# Patient Record
Sex: Female | Born: 1965 | Hispanic: No | Marital: Single | Smoking: Never smoker
Health system: Southern US, Community
[De-identification: ages and names within clinical notes are randomized; demographics above are authoritative.]

## PROBLEM LIST (undated history)

## (undated) ENCOUNTER — Emergency Department (HOSPITAL_COMMUNITY): Source: Home / Self Care

## (undated) ENCOUNTER — Emergency Department (HOSPITAL_COMMUNITY)

## (undated) DIAGNOSIS — N390 Urinary tract infection, site not specified: Secondary | ICD-10-CM

## (undated) DIAGNOSIS — I10 Essential (primary) hypertension: Secondary | ICD-10-CM

## (undated) DIAGNOSIS — N289 Disorder of kidney and ureter, unspecified: Secondary | ICD-10-CM

## (undated) DIAGNOSIS — Z9359 Other cystostomy status: Secondary | ICD-10-CM

## (undated) DIAGNOSIS — G40909 Epilepsy, unspecified, not intractable, without status epilepticus: Secondary | ICD-10-CM

## (undated) DIAGNOSIS — N186 End stage renal disease: Secondary | ICD-10-CM

## (undated) DIAGNOSIS — G8220 Paraplegia, unspecified: Secondary | ICD-10-CM

## (undated) DIAGNOSIS — C801 Malignant (primary) neoplasm, unspecified: Secondary | ICD-10-CM

## (undated) DIAGNOSIS — Z992 Dependence on renal dialysis: Secondary | ICD-10-CM

## (undated) DIAGNOSIS — R569 Unspecified convulsions: Secondary | ICD-10-CM

## (undated) DIAGNOSIS — N939 Abnormal uterine and vaginal bleeding, unspecified: Principal | ICD-10-CM

## (undated) HISTORY — DX: Essential (primary) hypertension: I10

## (undated) HISTORY — DX: Unspecified convulsions: R56.9

## (undated) HISTORY — DX: Abnormal uterine and vaginal bleeding, unspecified: N93.9

## (undated) HISTORY — PX: BACK SURGERY: SHX140

## (undated) HISTORY — DX: Other cystostomy status: Z93.59

## (undated) HISTORY — DX: Malignant (primary) neoplasm, unspecified: C80.1

---

## 1997-10-20 ENCOUNTER — Other Ambulatory Visit: Admission: RE | Admit: 1997-10-20 | Discharge: 1997-10-20 | Payer: Self-pay | Admitting: Obstetrics

## 1997-10-20 ENCOUNTER — Encounter: Admission: RE | Admit: 1997-10-20 | Discharge: 1997-10-20 | Payer: Self-pay | Admitting: Obstetrics

## 1998-01-11 ENCOUNTER — Inpatient Hospital Stay (HOSPITAL_COMMUNITY): Admission: AD | Admit: 1998-01-11 | Discharge: 1998-01-11 | Payer: Self-pay | Admitting: Obstetrics

## 1998-08-18 ENCOUNTER — Other Ambulatory Visit: Admission: RE | Admit: 1998-08-18 | Discharge: 1998-09-09 | Payer: Self-pay

## 1998-08-18 ENCOUNTER — Encounter: Admission: RE | Admit: 1998-08-18 | Discharge: 1998-08-18 | Payer: Self-pay | Admitting: Obstetrics & Gynecology

## 1998-09-21 ENCOUNTER — Encounter: Admission: RE | Admit: 1998-09-21 | Discharge: 1998-09-21 | Payer: Self-pay | Admitting: Obstetrics

## 1998-11-21 ENCOUNTER — Encounter: Admission: RE | Admit: 1998-11-21 | Discharge: 1998-11-21 | Payer: Self-pay | Admitting: Obstetrics & Gynecology

## 1999-02-09 ENCOUNTER — Encounter: Admission: RE | Admit: 1999-02-09 | Discharge: 1999-02-09 | Payer: Self-pay | Admitting: Obstetrics & Gynecology

## 1999-03-23 ENCOUNTER — Encounter: Admission: RE | Admit: 1999-03-23 | Discharge: 1999-03-23 | Payer: Self-pay | Admitting: Obstetrics & Gynecology

## 1999-03-23 ENCOUNTER — Other Ambulatory Visit: Admission: RE | Admit: 1999-03-23 | Discharge: 1999-03-23 | Payer: Self-pay | Admitting: *Deleted

## 2011-03-24 ENCOUNTER — Encounter: Payer: Self-pay | Admitting: *Deleted

## 2011-03-24 ENCOUNTER — Emergency Department (HOSPITAL_COMMUNITY)
Admission: EM | Admit: 2011-03-24 | Discharge: 2011-03-24 | Disposition: A | Discharge status: Home / Self Care | Payer: Medicaid Other | Attending: Emergency Medicine | Admitting: Emergency Medicine

## 2011-03-24 DIAGNOSIS — T8389XA Other specified complication of genitourinary prosthetic devices, implants and grafts, initial encounter: Secondary | ICD-10-CM | POA: Insufficient documentation

## 2011-03-24 DIAGNOSIS — Y849 Medical procedure, unspecified as the cause of abnormal reaction of the patient, or of later complication, without mention of misadventure at the time of the procedure: Secondary | ICD-10-CM | POA: Insufficient documentation

## 2011-03-24 DIAGNOSIS — T83091A Other mechanical complication of indwelling urethral catheter, initial encounter: Secondary | ICD-10-CM

## 2011-03-24 DIAGNOSIS — G8220 Paraplegia, unspecified: Secondary | ICD-10-CM | POA: Insufficient documentation

## 2011-03-24 HISTORY — DX: Paraplegia, unspecified: G82.20

## 2011-03-24 NOTE — ED Notes (Signed)
Pt reporting that she has not had any drainage from foley cath since having it changed today by home health.  At this time, leg bag does contain amber colored urine.  150 cc's emptied, pt reports this is the only output since this morning.  Informed EDP that some urine has been drained.  Request received to irrigate foley cath.

## 2011-03-24 NOTE — ED Notes (Signed)
Pt arrived via EMS.  Reports that she had her foley changed by home health RN today, and since that time has not has any urine output, despite adequate intake.

## 2011-03-24 NOTE — ED Notes (Signed)
Additional 150 cc's of urine emptied from bag. Catheter irrigated with 100 ccs sterile water. Drainage bag changed to bedside bag, rather than leg bag, for ease in draining.

## 2011-03-24 NOTE — ED Provider Notes (Signed)
History     CSN: [id number] Arrival date & time: 03/24/2011 12:11 AM   First MD Initiated Contact with Patient 03/24/11 0133      Chief Complaint  Patient presents with  . Urinary Retention    (Consider location/radiation/quality/duration/timing/severity/associated sxs/prior treatment) HPI Is a 45 year old Hispanic female with history of paraplegia. She has an indwelling Foley which she states stopped draining yesterday. She was having moderate discomfort due to an unrelated bladder. She denies fevers or chills. She denies nausea or vomiting. Her Foley was irrigated by nursing staff prior to my examination with relief of the obstruction. She denies discomfort at this time. Her Foley is now draining.  Past Medical History  Diagnosis Date  . Paraplegia (lower)     History reviewed. No pertinent past surgical history.  No family history on file.  History  Substance Use Topics  . Smoking status: Never Smoker   . Smokeless tobacco: Not on file  . Alcohol Use: No    OB History    Grav Para Term Preterm Abortions TAB SAB Ect Mult Living                  Review of Systems  All other systems reviewed and are negative.    Allergies  Review of patient's allergies indicates no known allergies.  Home Medications   Current Outpatient Rx  Name Route Sig Dispense Refill  . BACLOFEN 20 MG PO TABS Oral Take 20 mg by mouth 4 (four) times daily.      Marland Kitchen DANTROLENE SODIUM 25 MG PO CAPS Oral Take 50 mg by mouth 3 (three) times daily.      Marland Kitchen DIAZEPAM 5 MG PO TABS Oral Take 5 mg by mouth every 8 (eight) hours as needed.      Marland Kitchen DOCUSATE SODIUM 100 MG PO CAPS Oral Take 100 mg by mouth 2 (two) times daily.      Marland Kitchen GABAPENTIN 300 MG PO CAPS Oral Take 300 mg by mouth 3 (three) times daily.      Marland Kitchen NAPROXEN 500 MG PO TABS Oral Take 500 mg by mouth 2 (two) times daily with a meal.      . NITROFURANTOIN MACROCRYSTAL 50 MG PO CAPS Oral Take 50 mg by mouth at bedtime.        BP 104/77  Pulse  82  Temp(Src) 98.3 F (36.8 C) (Oral)  Resp 16  Ht 5' (1.524 m)  SpO2 98%  LMP 03/22/2011  Physical Exam General: Well-developed, well-nourished female in no acute distress; appearance consistent with age of record HENT: normocephalic, atraumatic Eyes: pupils equal round and reactive to light Neck: supple Heart: regular rate and rhythm Lungs: clear to auscultation bilaterally Abdomen: soft; nontender GU: Foley draining clear yellow urine Extremities: Feet in supportive orthotic devices bilaterally; atrophy of lower extremities Neurologic: Awake, alert; paraplegic Skin: Warm and dry     ED Course  Procedures (including critical care time)     MDM          Wynetta Fines, MD 03/24/11 [phone]

## 2011-04-16 ENCOUNTER — Other Ambulatory Visit (HOSPITAL_COMMUNITY): Payer: Self-pay | Admitting: Internal Medicine

## 2011-04-16 DIAGNOSIS — Z139 Encounter for screening, unspecified: Secondary | ICD-10-CM

## 2011-04-22 ENCOUNTER — Ambulatory Visit (HOSPITAL_COMMUNITY): Payer: Medicaid Other

## 2011-04-23 ENCOUNTER — Ambulatory Visit (HOSPITAL_COMMUNITY): Payer: Self-pay

## 2011-04-26 ENCOUNTER — Ambulatory Visit (HOSPITAL_COMMUNITY)
Admission: RE | Admit: 2011-04-26 | Discharge: 2011-04-26 | Disposition: A | Discharge status: Home / Self Care | Payer: Medicaid Other | Source: Ambulatory Visit | Attending: Internal Medicine | Admitting: Internal Medicine

## 2011-04-26 DIAGNOSIS — Z139 Encounter for screening, unspecified: Secondary | ICD-10-CM

## 2011-04-26 DIAGNOSIS — Z1231 Encounter for screening mammogram for malignant neoplasm of breast: Secondary | ICD-10-CM | POA: Insufficient documentation

## 2011-06-10 ENCOUNTER — Encounter (HOSPITAL_COMMUNITY): Payer: Self-pay

## 2011-06-10 ENCOUNTER — Emergency Department (HOSPITAL_COMMUNITY)
Admission: EM | Admit: 2011-06-10 | Discharge: 2011-06-10 | Disposition: A | Discharge status: Home / Self Care | Payer: Medicaid Other | Attending: Emergency Medicine | Admitting: Emergency Medicine

## 2011-06-10 DIAGNOSIS — Z79899 Other long term (current) drug therapy: Secondary | ICD-10-CM | POA: Insufficient documentation

## 2011-06-10 DIAGNOSIS — G8220 Paraplegia, unspecified: Secondary | ICD-10-CM | POA: Insufficient documentation

## 2011-06-10 DIAGNOSIS — R339 Retention of urine, unspecified: Secondary | ICD-10-CM | POA: Insufficient documentation

## 2011-06-10 DIAGNOSIS — N390 Urinary tract infection, site not specified: Secondary | ICD-10-CM

## 2011-06-10 LAB — URINALYSIS, ROUTINE W REFLEX MICROSCOPIC: Urobilinogen, UA: 1 mg/dL (ref 0.0–1.0)

## 2011-06-10 LAB — URINE MICROSCOPIC-ADD ON

## 2011-06-10 MED ORDER — NITROFURANTOIN MACROCRYSTAL 50 MG PO CAPS: 100.0000 mg | ORAL_CAPSULE | Freq: Every day | ORAL | Status: DC

## 2011-06-10 MED ORDER — NITROFURANTOIN MACROCRYSTAL 100 MG PO CAPS
ORAL_CAPSULE | ORAL | Status: AC
  Administered 2011-06-10: 100 mg
  Filled 2011-06-10: qty 1

## 2011-06-10 MED ORDER — NITROFURANTOIN MONOHYD MACRO 100 MG PO CAPS
100.0000 mg | ORAL_CAPSULE | Freq: Once | ORAL | Status: DC
  Filled 2011-06-10: qty 1
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
## 2011-06-10 NOTE — ED Notes (Signed)
Foley cath from home removed, w/o difficulty, new cath inserted.

## 2011-06-10 NOTE — ED Notes (Signed)
Foley cath not draining properly, pt tried as taught but will not drain

## 2011-08-04 ENCOUNTER — Emergency Department (HOSPITAL_COMMUNITY)
Admission: EM | Admit: 2011-08-04 | Discharge: 2011-08-04 | Disposition: A | Discharge status: Home / Self Care | Payer: Medicaid Other | Attending: Emergency Medicine | Admitting: Emergency Medicine

## 2011-08-04 ENCOUNTER — Encounter (HOSPITAL_COMMUNITY): Payer: Self-pay

## 2011-08-04 DIAGNOSIS — T839XXA Unspecified complication of genitourinary prosthetic device, implant and graft, initial encounter: Secondary | ICD-10-CM

## 2011-08-04 DIAGNOSIS — T83091A Other mechanical complication of indwelling urethral catheter, initial encounter: Secondary | ICD-10-CM | POA: Insufficient documentation

## 2011-08-04 DIAGNOSIS — R339 Retention of urine, unspecified: Secondary | ICD-10-CM | POA: Insufficient documentation

## 2011-08-04 DIAGNOSIS — G8220 Paraplegia, unspecified: Secondary | ICD-10-CM | POA: Insufficient documentation

## 2011-08-04 DIAGNOSIS — Z79899 Other long term (current) drug therapy: Secondary | ICD-10-CM | POA: Insufficient documentation

## 2011-08-04 DIAGNOSIS — Y846 Urinary catheterization as the cause of abnormal reaction of the patient, or of later complication, without mention of misadventure at the time of the procedure: Secondary | ICD-10-CM | POA: Insufficient documentation

## 2011-08-04 NOTE — ED Notes (Signed)
Awaiting MD evaluation.

## 2011-08-04 NOTE — ED Notes (Signed)
No change in pt condition or assessment. Awaiting D/C paperwork from MD. Catheter is draining clear yellow urine.

## 2011-08-04 NOTE — Discharge Instructions (Signed)
Continue your regular catheter care. Follow up with your doctor.

## 2011-08-04 NOTE — ED Notes (Signed)
Pt a/ox4. resp even and unlabored. NAD at this time. D/C instructions reviewed with pt. Pt verbalized understanding. Pt out of facility via EMS stretcher.

## 2011-08-04 NOTE — ED Notes (Signed)
77 F Foley Cath inserted under sterile technique. Clear yellow urine returned. Leg bag placed per pt request.

## 2011-08-04 NOTE — ED Notes (Signed)
Awaiting transport.

## 2011-08-04 NOTE — ED Notes (Signed)
Pt brought in by EMS for leaking urinary cath. Pt states she feels like it is leaking from the inside. Pt states her cath might have been changed last month.

## 2011-08-04 NOTE — ED Provider Notes (Signed)
History     CSN: [id number]  Arrival date & time 08/04/11  1049   First MD Initiated Contact with Patient 08/04/11 1304      Chief Complaint  Patient presents with  . Urinary Retention    (Consider location/radiation/quality/duration/timing/severity/associated sxs/prior treatment) HPI  @[name]  IS A 46 y.o. female brought in by parents to the Emergency Department complaining of  Leaking around her urinary catheter which began last night. Patient has a long term indwelling catheter due to paraplegia.Denies fever, chills, nausea, vomiting, chest pain, shortness of breath.  PCP Dr. Legrand Rams Past Medical History  Diagnosis Date  . Paraplegia (lower)     History reviewed. No pertinent past surgical history.  No family history on file.  History  Substance Use Topics  . Smoking status: Never Smoker   . Smokeless tobacco: Not on file  . Alcohol Use: No    OB History    Grav Para Term Preterm Abortions TAB SAB Ect Mult Living                  Review of Systems A 10 review of systems reviewed and are negative for acute change except as noted in the HPI. Allergies  Review of patient's allergies indicates no known allergies.  Home Medications   Current Outpatient Rx  Name Route Sig Dispense Refill  . BACLOFEN 20 MG PO TABS Oral Take 20 mg by mouth 4 (four) times daily.      Marland Kitchen DANTROLENE SODIUM 25 MG PO CAPS Oral Take 50 mg by mouth 3 (three) times daily.      Marland Kitchen DIAZEPAM 5 MG PO TABS Oral Take 5 mg by mouth every 8 (eight) hours as needed. For anxiety    . DOCUSATE SODIUM 100 MG PO CAPS Oral Take 100 mg by mouth 2 (two) times daily.      Marland Kitchen GABAPENTIN 300 MG PO CAPS Oral Take 300 mg by mouth 3 (three) times daily.      Marland Kitchen NAPROXEN 500 MG PO TABS Oral Take 500 mg by mouth 2 (two) times daily with a meal.      . NITROFURANTOIN MACROCRYSTAL 50 MG PO CAPS Oral Take 2 capsules (100 mg total) by mouth at bedtime. 10 capsule 0    BP 121/82  Pulse 73  Temp(Src) 98.4 F (36.9 C)  (Oral)  Resp 18  Ht 5\' 3"  (1.6 m)  Wt 145 lb (65.772 kg)  BMI 25.69 kg/m2  SpO2 99%  LMP 07/25/2011  Physical Exam Physical examination:  Nursing notes reviewed; Vital signs and O2 SAT reviewed;  Constitutional: Well developed, Well nourished, Well hydrated, In no acute distress; Head:  Normocephalic, atraumatic; Eyes: EOMI, PERRL, No scleral icterus; ENMT: Mouth and pharynx normal, Mucous membranes moist; Neck: Supple, Full range of motion, No lymphadenopathy; Cardiovascular: Regular rate and rhythm, No murmur, rub, or gallop; Respiratory: Breath sounds clear & equal bilaterally, No rales, rhonchi, wheezes, or rub, Normal respiratory effort/excursion; Chest: Nontender, Movement normal; Abdomen: Soft, Nontender, Nondistended, Normal bowel sounds; Genitourinary: No CVA tenderness; Extremities: Pulses normal, No tenderness, No edema, No calf edema or asymmetry.; Neuro: AA&Ox3, Major CN grossly intact.  Lower extremity paraplegia. Both lower legs re in CAM walkers Skin: Color normal, Warm, Dry  ED Course  Procedures (including critical care time)    MDM  Paraplegic patient with leaking around the foley catheter. Catheter replaced.Pt stable in ED with no significant deterioration in condition.The patient appears reasonably screened and/or stabilized for discharge and I doubt any other  medical condition or other [facility] requiring further screening, evaluation, or treatment in the ED at this time prior to discharge.  MDM Reviewed: nursing note and vitals           Gypsy Balsam. Olin Hauser, MD 08/04/11 1413

## 2011-08-14 ENCOUNTER — Encounter (HOSPITAL_COMMUNITY): Payer: Self-pay

## 2011-08-14 ENCOUNTER — Emergency Department (HOSPITAL_COMMUNITY)
Admission: EM | Admit: 2011-08-14 | Discharge: 2011-08-14 | Disposition: A | Discharge status: Home / Self Care | Payer: Medicaid Other | Attending: Emergency Medicine | Admitting: Emergency Medicine

## 2011-08-14 DIAGNOSIS — G8220 Paraplegia, unspecified: Secondary | ICD-10-CM | POA: Insufficient documentation

## 2011-08-14 DIAGNOSIS — R339 Retention of urine, unspecified: Secondary | ICD-10-CM | POA: Insufficient documentation

## 2011-08-14 DIAGNOSIS — T8389XA Other specified complication of genitourinary prosthetic devices, implants and grafts, initial encounter: Secondary | ICD-10-CM | POA: Insufficient documentation

## 2011-08-14 DIAGNOSIS — T83091A Other mechanical complication of indwelling urethral catheter, initial encounter: Secondary | ICD-10-CM

## 2011-08-14 NOTE — ED Notes (Signed)
EMS called for transport home.

## 2011-08-14 NOTE — ED Provider Notes (Signed)
History   This chart was scribed for [employer]. Olin Hauser, MD by Reece Agar. The patient was seen in room APA09/APA09. Patient's care was started at 0717.   CSN: [id number]  Arrival date & time 08/14/11  [id number]   First MD Initiated Contact with Patient 08/14/11 [phone]      Chief Complaint  Patient presents with  . Urinary Retention    (Consider location/radiation/quality/duration/timing/severity/associated sxs/prior treatment) HPI [name] is a 46 y.o. female who presents to the Emergency Department stating that her indwelling foley catheter ceased draining urine onset yesterday and persistent since. States lack of draining from catheter was not relieved with irrigation of catheter performed yesterday by herself. Patient notes her catheter was last replaced 1 month ago. Also notes she has had difficulties with the foley catheter leaking intermittently over the past several weeks. Denies fever, chills, nausea, vomiting, diarrhea. Patient with a h/o paraplegia.  Past Medical History  Diagnosis Date  . Paraplegia (lower)     History reviewed. No pertinent past surgical history.  No family history on file.  History  Substance Use Topics  . Smoking status: Never Smoker   . Smokeless tobacco: Not on file  . Alcohol Use: No    OB History    Grav Para Term Preterm Abortions TAB SAB Ect Mult Living                  Review of Systems 10 Systems reviewed and are negative for acute change except as noted in the HPI.  Allergies  Review of patient's allergies indicates no known allergies.  Home Medications   Current Outpatient Rx  Name Route Sig Dispense Refill  . BACLOFEN 20 MG PO TABS Oral Take 20 mg by mouth 4 (four) times daily.      Marland Kitchen DANTROLENE SODIUM 25 MG PO CAPS Oral Take 50 mg by mouth 3 (three) times daily.      Marland Kitchen DIAZEPAM 5 MG PO TABS Oral Take 5 mg by mouth every 8 (eight) hours as needed. For anxiety    . DOCUSATE SODIUM 100 MG PO CAPS Oral Take 100 mg by  mouth 2 (two) times daily.      Marland Kitchen GABAPENTIN 300 MG PO CAPS Oral Take 300 mg by mouth 3 (three) times daily.      Marland Kitchen NAPROXEN 500 MG PO TABS Oral Take 500 mg by mouth 2 (two) times daily with a meal.      . NITROFURANTOIN MACROCRYSTAL 50 MG PO CAPS Oral Take 2 capsules (100 mg total) by mouth at bedtime. 10 capsule 0    BP 127/81  Pulse 72  Temp(Src) 97.9 F (36.6 C) (Oral)  Resp 18  Ht 5\' 2"  (1.575 m)  Wt 120 lb (54.432 kg)  BMI 21.95 kg/m2  SpO2 98%  LMP 08/13/2011  Physical Exam  Nursing note and vitals reviewed. Constitutional: She is oriented to person, place, and time. She appears well-developed and well-nourished. No distress.  HENT:  Head: Normocephalic and atraumatic.  Eyes: EOM are normal. Pupils are equal, round, and reactive to light.  Neck: Neck supple. No tracheal deviation present.  Cardiovascular: Normal rate, regular rhythm and normal heart sounds.   No murmur heard. Pulmonary/Chest: Effort normal. No respiratory distress. She has no wheezes.  Abdominal: Soft. She exhibits no distension. There is no tenderness.  Genitourinary:       Indwelling foley catheter present.   Musculoskeletal: Normal range of motion. She exhibits no edema.  Braces to bilateral feet/lower legs.   Neurological: She is alert and oriented to person, place, and time. No sensory deficit.  Skin: Skin is warm and dry.  Psychiatric: She has a normal mood and affect. Her behavior is normal.    ED Course  Procedures (including critical care time) DIAGNOSTIC STUDIES: Oxygen Saturation is 98% on room air, normal by my interpretation.    COORDINATION OF CARE: 7:50AM-Patient informed of current plan for treatment and evaluation and agrees with plan at this time.     Labs Reviewed - No data to display No results found.   No diagnosis found.    MDM  Paraplegic patient with continuous indwelling Foley catheter having drainage problems. Catheter replaced.Pt stable in ED with no  significant deterioration in condition.The patient appears reasonably screened and/or stabilized for discharge and I doubt any other medical condition or other [facility] requiring further screening, evaluation, or treatment in the ED at this time prior to discharge.  I personally performed the services described in this documentation, which was scribed in my presence. The recorded information has been reviewed and considered.   MDM Reviewed: nursing note and vitals           Gypsy Balsam. Olin Hauser, MD 08/14/11 1146

## 2011-08-14 NOTE — ED Notes (Signed)
20 F Foley catheter inserted. Urine return noted. 20 cc fluid inserted into balloon.

## 2011-08-14 NOTE — Discharge Instructions (Signed)
Follow up with your doctor

## 2011-08-14 NOTE — ED Notes (Signed)
Patient asked if the leg bag could be changed to a regular size catheter bag. RN [employer] notified.

## 2011-08-14 NOTE — ED Notes (Signed)
[facility] here to transport pt home.  

## 2011-08-14 NOTE — ED Notes (Signed)
Pt says is urinating around her foley catheter.

## 2011-08-14 NOTE — ED Notes (Signed)
Pt reports irrigated her catheter around 0130 this am and says has been stopped up since.  Pt has some urine in foley bag.

## 2011-09-24 ENCOUNTER — Ambulatory Visit: Payer: Medicaid Other | Admitting: Urology

## 2011-09-29 ENCOUNTER — Encounter (HOSPITAL_COMMUNITY): Payer: Self-pay | Admitting: Emergency Medicine

## 2011-09-29 ENCOUNTER — Emergency Department (HOSPITAL_COMMUNITY)
Admission: EM | Admit: 2011-09-29 | Discharge: 2011-09-29 | Disposition: A | Discharge status: Home / Self Care | Payer: Medicaid Other | Attending: Emergency Medicine | Admitting: Emergency Medicine

## 2011-09-29 DIAGNOSIS — Y846 Urinary catheterization as the cause of abnormal reaction of the patient, or of later complication, without mention of misadventure at the time of the procedure: Secondary | ICD-10-CM | POA: Insufficient documentation

## 2011-09-29 DIAGNOSIS — T83091A Other mechanical complication of indwelling urethral catheter, initial encounter: Secondary | ICD-10-CM

## 2011-09-29 DIAGNOSIS — T8389XA Other specified complication of genitourinary prosthetic devices, implants and grafts, initial encounter: Secondary | ICD-10-CM | POA: Insufficient documentation

## 2011-09-29 DIAGNOSIS — Z79899 Other long term (current) drug therapy: Secondary | ICD-10-CM | POA: Insufficient documentation

## 2011-09-29 DIAGNOSIS — G8220 Paraplegia, unspecified: Secondary | ICD-10-CM | POA: Insufficient documentation

## 2011-09-29 LAB — URINALYSIS, ROUTINE W REFLEX MICROSCOPIC
Bilirubin Urine: NEGATIVE
Glucose, UA: NEGATIVE mg/dL
Ketones, ur: NEGATIVE mg/dL
pH: 6 (ref 5.0–8.0)

## 2011-09-29 LAB — URINE MICROSCOPIC-ADD ON

## 2011-09-29 MED ORDER — CEPHALEXIN 500 MG PO CAPS: 500.0000 mg | ORAL_CAPSULE | Freq: Four times a day (QID) | ORAL | Status: AC

## 2011-09-29 MED ORDER — CEPHALEXIN 500 MG PO CAPS
500.0000 mg | ORAL_CAPSULE | Freq: Once | ORAL | Status: AC
  Administered 2011-09-29: 500 mg via ORAL
  Filled 2011-09-29: qty 1

## 2011-09-29 NOTE — ED Notes (Signed)
Pt brought in by EMS for what pt says is a displaced urinary foley catheter.

## 2011-09-29 NOTE — ED Notes (Signed)
[facility] contacted for transport home.

## 2011-09-29 NOTE — Discharge Instructions (Signed)
RESOURCE GUIDE  Dental Problems  Patients with Medicaid: [place] [employer] Phone:  [phone]                                                  Phone:  [phone]  If unable to pay or uninsured, contact:  [facility] or [facility]. to become qualified for the adult dental clinic.  Chronic Pain Problems Contact [facility]  [phone] Patients need to be referred by their primary care doctor.  Insufficient Money for Medicine Contact [employer]:  call "211" or [place] [phone].  No Primary Care Doctor Call [facility]  [phone] Other agencies that provide inexpensive medical care    [place]  [phone]    [facility]  [place]  [phone]    [facility]  [phone]    [facility]  [place]  [place]  [phone] [place]   [phone] (emergency services [phone])  Substance [facility]  [phone] [facility] [phone] [facility] [place] [phone] [place] [phone] Residential & Outpatient Substance Abuse Program  [phone]  Abuse/Neglect [place] [phone] [place] [phone] (After Hours)  Emergency [place] [phone]  [place] [facility] the [place] [phone] [place] [phone]  MRSA Hotline #:   727-235-4053    [facility] Dept. [address]      [place] Phone:  [phone]                                   Phone:  [phone]                 Phone:  [place] Phone:  [phone]  [place] [phone] (After Hours)   Take the prescription as directed.  Call your regular medical doctor tomorrow morning to schedule a follow up appointment within the next week.  Return to the Emergency Department immediately sooner if worsening.

## 2011-09-29 NOTE — ED Notes (Signed)
Transport home via [employer].  DC instructions RX with patient

## 2011-09-29 NOTE — ED Notes (Signed)
Patient is comfortable at this time.

## 2011-09-29 NOTE — ED Notes (Signed)
Pt has clear urine in leg bag.

## 2011-09-29 NOTE — ED Notes (Signed)
Patient discharged home via EMS - This RN noticed documentation of IV that was not observed by this RN for this patient.  Called patient at home to ask if the IV catheter was in her arm - she said not "they never put an IV catheter in my arm just my urinary catheter.  Documented removal of catheter that was not present to my knowledge.

## 2011-09-29 NOTE — ED Notes (Signed)
Pt states she passing urine normally and c/o pain in her ribs.

## 2011-09-29 NOTE — ED Provider Notes (Signed)
History   This chart was scribed for Alfonzo Feller, DO by Reece Agar. The patient was seen in room APA15/APA15.    CSN: [id number]  Arrival date & time 09/29/11  1644   First MD Initiated Contact with Patient 09/29/11 1712      Chief Complaint  Patient presents with  . Urinary Incontinence     HPI Pt was seen at 1730. [name] is a 46 y.o. female who presents to the Emergency Department complaining of gradual onset and persistence of constant chronic indwelling foley catheter malfunction that began yesterday.  Pt states she noticed that her leg bag was not collecting urine so she flushed her catheter.  States the water flush did not return through the tubing, but spilled around it at her perinum.  Patient states she needs catheter completely replaced and "thinks I have a UTI."   States she has an appt to see PCP Dr. Legrand Rams at the end of this month.  Denies fevers, no rash, no abd pain, no back/flank pain, no N/V/D.    Past Medical History  Diagnosis Date  . Paraplegia (lower)     History reviewed. No pertinent past surgical history.   History  Substance Use Topics  . Smoking status: Never Smoker   . Smokeless tobacco: Not on file  . Alcohol Use: No    Review of Systems ROS: Statement: All systems negative except as marked or noted in the HPI; Constitutional: Negative for fever and chills. ; ; Eyes: Negative for eye pain, redness and discharge. ; ; ENMT: Negative for ear pain, hoarseness, nasal congestion, sinus pressure and sore throat. ; ; Cardiovascular: Negative for chest pain, palpitations, diaphoresis, dyspnea and peripheral edema. ; ; Respiratory: Negative for cough, wheezing and stridor. ; ; Gastrointestinal: Negative for nausea, vomiting, diarrhea, abdominal pain, blood in stool, hematemesis, jaundice and rectal bleeding. . ; ; Genitourinary: +foley catheter leaking, +foul smelling urine.  Negative for flank pain and hematuria. ; ; Musculoskeletal: Negative  for back pain and neck pain. Negative for swelling and trauma.; ; Skin: Negative for pruritus, rash, abrasions, blisters, bruising and skin lesion.; ; Neuro: Negative for headache, lightheadedness and neck stiffness. Negative for weakness, altered level of consciousness , altered mental status, extremity weakness, paresthesias, involuntary movement, seizure and syncope.     Allergies  Review of patient's allergies indicates no known allergies.  Home Medications   Current Outpatient Rx  Name Route Sig Dispense Refill  . BACLOFEN 20 MG PO TABS Oral Take 40 mg by mouth 4 (four) times daily.     Marland Kitchen DANTROLENE SODIUM 25 MG PO CAPS Oral Take 25 mg by mouth daily.     Marland Kitchen DIAZEPAM 5 MG PO TABS Oral Take 5 mg by mouth every 12 (twelve) hours as needed. For anxiety    . GABAPENTIN 300 MG PO CAPS Oral Take 300 mg by mouth 3 (three) times daily.      Marland Kitchen ONE-DAILY MULTI VITAMINS PO TABS Oral Take 1 tablet by mouth daily.    Marland Kitchen NAPROXEN 500 MG PO TABS Oral Take 500 mg by mouth 2 (two) times daily with a meal.      . SENNOSIDES-DOCUSATE SODIUM 8.6-50 MG PO TABS Oral Take 1 tablet by mouth at bedtime.      BP 129/79  Pulse 79  Temp(Src) 97.7 F (36.5 C) (Oral)  Resp 17  Ht 5\' 6"  (1.676 m)  Wt 130 lb (58.968 kg)  BMI 20.98 kg/m2  SpO2 98%  Physical Exam 1735: Physical examination:  Nursing notes reviewed; Vital signs and O2 SAT reviewed;  Constitutional: Well developed, Well nourished, Well hydrated, In no acute distress; Head:  Normocephalic, atraumatic; Eyes: EOMI, PERRL, No scleral icterus; ENMT: Mouth and pharynx normal, Mucous membranes moist; Neck: Supple, Full range of motion, No lymphadenopathy; Cardiovascular: Regular rate and rhythm, No murmur, rub, or gallop; Respiratory: Breath sounds clear & equal bilaterally, No rales, rhonchi, wheezes, or rub, Normal respiratory effort/excursion; Chest: Nontender, Movement normal; Abdomen: Soft, Nontender, Nondistended, Normal bowel sounds, +foley catheter  in place, leaking foul smelling cloudy urine.; Extremities: Pulses normal, No tenderness, No edema, No calf edema or asymmetry.; Neuro: AA&Ox3, Major CN grossly intact. +braces bilat LE's, hx paraparesis.; Skin: Color normal, Warm, Dry   ED Course  Procedures    MDM  MDM Reviewed: nursing note, previous chart and vitals Interpretation: labs   Results for orders placed during the hospital encounter of 09/29/11  URINALYSIS, ROUTINE W REFLEX MICROSCOPIC      Component Value Range   Color, Urine YELLOW  YELLOW    APPearance CLOUDY (*) CLEAR    Specific Gravity, Urine 1.025  1.005 - 1.030    pH 6.0  5.0 - 8.0    Glucose, UA NEGATIVE  NEGATIVE (mg/dL)   Hgb urine dipstick LARGE (*) NEGATIVE    Bilirubin Urine NEGATIVE  NEGATIVE    Ketones, ur NEGATIVE  NEGATIVE (mg/dL)   Protein, ur 30 (*) NEGATIVE (mg/dL)   Urobilinogen, UA 0.2  0.0 - 1.0 (mg/dL)   Nitrite POSITIVE (*) NEGATIVE    Leukocytes, UA MODERATE (*) NEGATIVE   URINE MICROSCOPIC-ADD ON      Component Value Range   Squamous Epithelial / LPF MANY (*) RARE    WBC, UA 11-20  <3 (WBC/hpf)   RBC / HPF 11-20  <3 (RBC/hpf)   Bacteria, UA MANY (*) RARE      7:48 PM:  Pt states she "feels like I have a UTI."  Has chronic indwelling foley cath.  UC is pending.  Afebrile, VSS.  Foley has been changed, is draining well, and she wants to go home now.  Requesting "some oxycodone" because she has "run out of it for my pains."  Aware she will need to call her PMD to schedule a f/u appt to fill her chronic pain medications.  Dx testing d/w pt.  Questions answered.  Verb understanding, agreeable to d/c home with outpt f/u.          I personally performed the services described in this documentation, which was scribed in my presence. The recorded information has been reviewed and considered. [place], DO 10/01/11 [phone]

## 2011-10-03 LAB — URINE CULTURE: Culture  Setup Time: 201305052213

## 2011-10-04 NOTE — ED Notes (Signed)
+  Urine. Patient given Keflex. No sensitivity listed. Chart sent to [place] office for review.

## 2011-10-05 NOTE — ED Notes (Signed)
Chart returned from [place] office. Prescribed Macrobid 100 mg. One tablet po q 12 hrs x 7 days. #14. Prescribed by Imagene Sheller PA-C.

## 2011-10-05 NOTE — ED Notes (Signed)
Pt notified of positive urine culture after ID verified x two. RX Macrobid called to [facility] [phone]

## 2011-10-25 ENCOUNTER — Ambulatory Visit (INDEPENDENT_AMBULATORY_CARE_PROVIDER_SITE_OTHER): Payer: Medicaid Other | Admitting: Urology

## 2011-10-25 DIAGNOSIS — N3942 Incontinence without sensory awareness: Secondary | ICD-10-CM

## 2011-10-25 DIAGNOSIS — N319 Neuromuscular dysfunction of bladder, unspecified: Secondary | ICD-10-CM

## 2011-10-25 DIAGNOSIS — N3020 Other chronic cystitis without hematuria: Secondary | ICD-10-CM

## 2011-12-27 ENCOUNTER — Ambulatory Visit: Payer: Medicaid Other | Admitting: Urology

## 2012-01-03 DIAGNOSIS — N319 Neuromuscular dysfunction of bladder, unspecified: Secondary | ICD-10-CM | POA: Insufficient documentation

## 2012-04-28 ENCOUNTER — Other Ambulatory Visit (HOSPITAL_COMMUNITY): Payer: Self-pay | Admitting: Internal Medicine

## 2012-04-28 DIAGNOSIS — Z139 Encounter for screening, unspecified: Secondary | ICD-10-CM

## 2012-05-05 ENCOUNTER — Ambulatory Visit (HOSPITAL_COMMUNITY)
Admission: RE | Admit: 2012-05-05 | Discharge: 2012-05-05 | Disposition: A | Discharge status: Home / Self Care | Payer: Medicaid Other | Source: Ambulatory Visit | Attending: Internal Medicine | Admitting: Internal Medicine

## 2012-05-05 DIAGNOSIS — Z139 Encounter for screening, unspecified: Secondary | ICD-10-CM

## 2012-05-05 DIAGNOSIS — Z1231 Encounter for screening mammogram for malignant neoplasm of breast: Secondary | ICD-10-CM | POA: Insufficient documentation

## 2012-06-21 ENCOUNTER — Encounter (HOSPITAL_COMMUNITY): Payer: Self-pay | Admitting: *Deleted

## 2012-06-21 ENCOUNTER — Emergency Department (HOSPITAL_COMMUNITY): Payer: Medicaid Other

## 2012-06-21 ENCOUNTER — Inpatient Hospital Stay (HOSPITAL_COMMUNITY)
Admission: EM | Admit: 2012-06-21 | Discharge: 2012-06-30 | DRG: 871 | Disposition: A | Discharge status: Home Health | Payer: Medicaid Other | Attending: Internal Medicine | Admitting: Internal Medicine

## 2012-06-21 DIAGNOSIS — Z79899 Other long term (current) drug therapy: Secondary | ICD-10-CM

## 2012-06-21 DIAGNOSIS — A419 Sepsis, unspecified organism: Secondary | ICD-10-CM | POA: Diagnosis present

## 2012-06-21 DIAGNOSIS — N10 Acute tubulo-interstitial nephritis: Secondary | ICD-10-CM | POA: Diagnosis present

## 2012-06-21 DIAGNOSIS — L89309 Pressure ulcer of unspecified buttock, unspecified stage: Secondary | ICD-10-CM

## 2012-06-21 DIAGNOSIS — A4159 Other Gram-negative sepsis: Principal | ICD-10-CM | POA: Diagnosis present

## 2012-06-21 DIAGNOSIS — G8220 Paraplegia, unspecified: Secondary | ICD-10-CM

## 2012-06-21 DIAGNOSIS — IMO0002 Reserved for concepts with insufficient information to code with codable children: Secondary | ICD-10-CM

## 2012-06-21 DIAGNOSIS — R4182 Altered mental status, unspecified: Secondary | ICD-10-CM

## 2012-06-21 DIAGNOSIS — N1330 Unspecified hydronephrosis: Secondary | ICD-10-CM | POA: Diagnosis present

## 2012-06-21 DIAGNOSIS — N12 Tubulo-interstitial nephritis, not specified as acute or chronic: Secondary | ICD-10-CM

## 2012-06-21 DIAGNOSIS — Z435 Encounter for attention to cystostomy: Secondary | ICD-10-CM

## 2012-06-21 DIAGNOSIS — R319 Hematuria, unspecified: Secondary | ICD-10-CM

## 2012-06-21 DIAGNOSIS — Z9359 Other cystostomy status: Secondary | ICD-10-CM

## 2012-06-21 DIAGNOSIS — R7881 Bacteremia: Secondary | ICD-10-CM

## 2012-06-21 DIAGNOSIS — L8994 Pressure ulcer of unspecified site, stage 4: Secondary | ICD-10-CM | POA: Diagnosis present

## 2012-06-21 LAB — COMPREHENSIVE METABOLIC PANEL
ALT: 25 U/L (ref 0–35)
BUN: 27 mg/dL — ABNORMAL HIGH (ref 6–23)
CO2: 23 mEq/L (ref 19–32)
Calcium: 9 mg/dL (ref 8.4–10.5)
Creatinine, Ser: 1.24 mg/dL — ABNORMAL HIGH (ref 0.50–1.10)
GFR calc Af Amer: 60 mL/min — ABNORMAL LOW (ref >90)
GFR calc non Af Amer: 52 mL/min — ABNORMAL LOW (ref >90)
Glucose, Bld: 103 mg/dL — ABNORMAL HIGH (ref 70–99)
Total Protein: 7.9 g/dL (ref 6.0–8.3)

## 2012-06-21 LAB — URINALYSIS, ROUTINE W REFLEX MICROSCOPIC
Nitrite: POSITIVE — AB
Protein, ur: >300 mg/dL — AB
Specific Gravity, Urine: 1.01 (ref 1.005–1.030)
Urobilinogen, UA: 0.2 mg/dL (ref 0.0–1.0)

## 2012-06-21 LAB — PREGNANCY, URINE: Preg Test, Ur: NEGATIVE

## 2012-06-21 LAB — CBC
HCT: 31.6 % — ABNORMAL LOW (ref 36.0–46.0)
Hemoglobin: 10.2 g/dL — ABNORMAL LOW (ref 12.0–15.0)
MCH: 29.2 pg (ref 26.0–34.0)
MCHC: 32.3 g/dL (ref 30.0–36.0)
MCV: 90.5 fL (ref 78.0–100.0)
RBC: 3.49 MIL/uL — ABNORMAL LOW (ref 3.87–5.11)

## 2012-06-21 LAB — LACTIC ACID, PLASMA: Lactic Acid, Venous: 2.3 mmol/L — ABNORMAL HIGH (ref 0.5–2.2)

## 2012-06-21 LAB — URINE MICROSCOPIC-ADD ON

## 2012-06-21 MED ORDER — IOHEXOL 300 MG/ML  SOLN
50.0000 mL | Freq: Once | INTRAMUSCULAR | Status: AC | PRN
  Administered 2012-06-21: 50 mL via ORAL

## 2012-06-21 MED ORDER — LACTATED RINGERS IV BOLUS (SEPSIS)
500.0000 mL | Freq: Once | INTRAVENOUS | Status: AC
  Administered 2012-06-21: 06:00:00 via INTRAVENOUS

## 2012-06-21 MED ORDER — VANCOMYCIN HCL 1000 MG IV SOLR
750.0000 mg | Freq: Two times a day (BID) | INTRAVENOUS | Status: DC
  Filled 2012-06-21 (×3): qty 750

## 2012-06-21 MED ORDER — OXYCODONE-ACETAMINOPHEN 5-325 MG PO TABS
1.0000 | ORAL_TABLET | ORAL | Status: DC | PRN
  Administered 2012-06-21 – 2012-06-27 (×5): 1 via ORAL
  Filled 2012-06-21 (×6): qty 1

## 2012-06-21 MED ORDER — ONDANSETRON HCL 4 MG/2ML IJ SOLN
4.0000 mg | Freq: Once | INTRAMUSCULAR | Status: AC
  Administered 2012-06-21: 4 mg via INTRAVENOUS
  Filled 2012-06-21: qty 2

## 2012-06-21 MED ORDER — HYDROMORPHONE HCL PF 1 MG/ML IJ SOLN: 1.0000 mg | INTRAMUSCULAR | Status: AC | PRN

## 2012-06-21 MED ORDER — CHLORHEXIDINE GLUCONATE CLOTH 2 % EX PADS
6.0000 | MEDICATED_PAD | Freq: Every day | CUTANEOUS | Status: AC
  Administered 2012-06-22 – 2012-06-26 (×5): 6 via TOPICAL

## 2012-06-21 MED ORDER — LACTATED RINGERS IV SOLN: INTRAVENOUS | Status: AC

## 2012-06-21 MED ORDER — DIAZEPAM 5 MG PO TABS: 5.0000 mg | ORAL_TABLET | Freq: Two times a day (BID) | ORAL | Status: DC | PRN

## 2012-06-21 MED ORDER — DANTROLENE SODIUM 25 MG PO CAPS
25.0000 mg | ORAL_CAPSULE | Freq: Every day | ORAL | Status: DC
  Administered 2012-06-21 – 2012-06-30 (×10): 25 mg via ORAL
  Filled 2012-06-21 (×6): qty 1

## 2012-06-21 MED ORDER — IOHEXOL 300 MG/ML  SOLN
100.0000 mL | Freq: Once | INTRAMUSCULAR | Status: AC | PRN
  Administered 2012-06-21: 100 mL via INTRAVENOUS

## 2012-06-21 MED ORDER — GABAPENTIN 300 MG PO CAPS
300.0000 mg | ORAL_CAPSULE | Freq: Three times a day (TID) | ORAL | Status: DC
  Administered 2012-06-21 – 2012-06-30 (×27): 300 mg via ORAL
  Filled 2012-06-21 (×32): qty 1

## 2012-06-21 MED ORDER — PIPERACILLIN-TAZOBACTAM 3.375 G IVPB
3.3750 g | Freq: Three times a day (TID) | INTRAVENOUS | Status: DC
  Administered 2012-06-21 – 2012-06-24 (×10): 3.375 g via INTRAVENOUS
  Filled 2012-06-21 (×15): qty 50

## 2012-06-21 MED ORDER — PIPERACILLIN-TAZOBACTAM 3.375 G IVPB
3.3750 g | Freq: Once | INTRAVENOUS | Status: AC
  Administered 2012-06-21: 3.375 g via INTRAVENOUS
  Filled 2012-06-21: qty 50

## 2012-06-21 MED ORDER — MUPIROCIN 2 % EX OINT
1.0000 "application " | TOPICAL_OINTMENT | Freq: Two times a day (BID) | CUTANEOUS | Status: AC
  Administered 2012-06-21 – 2012-06-25 (×10): 1 via NASAL
  Filled 2012-06-21 (×4): qty 22

## 2012-06-21 MED ORDER — NAPROXEN 500 MG PO TABS
500.0000 mg | ORAL_TABLET | Freq: Two times a day (BID) | ORAL | Status: DC
  Administered 2012-06-21 – 2012-06-30 (×18): 500 mg via ORAL
  Filled 2012-06-21 (×4): qty 1
  Filled 2012-06-21 (×3): qty 2
  Filled 2012-06-21 (×7): qty 1
  Filled 2012-06-21: qty 2
  Filled 2012-06-21 (×2): qty 1
  Filled 2012-06-21: qty 2
  Filled 2012-06-21: qty 1
  Filled 2012-06-21 (×3): qty 2

## 2012-06-21 MED ORDER — LACTATED RINGERS IV BOLUS (SEPSIS)
1000.0000 mL | Freq: Once | INTRAVENOUS | Status: AC
  Administered 2012-06-21: 1000 mL via INTRAVENOUS

## 2012-06-21 MED ORDER — VANCOMYCIN HCL IN DEXTROSE 1-5 GM/200ML-% IV SOLN
1000.0000 mg | Freq: Once | INTRAVENOUS | Status: AC
  Administered 2012-06-21: 1000 mg via INTRAVENOUS
  Filled 2012-06-21: qty 200

## 2012-06-21 MED ORDER — DANTROLENE SODIUM 25 MG PO CAPS
25.0000 mg | ORAL_CAPSULE | Freq: Every day | ORAL | Status: DC
  Filled 2012-06-21 (×2): qty 1

## 2012-06-21 MED ORDER — BACLOFEN 10 MG PO TABS
40.0000 mg | ORAL_TABLET | Freq: Four times a day (QID) | ORAL | Status: DC
  Administered 2012-06-21 – 2012-06-30 (×36): 40 mg via ORAL
  Filled 2012-06-21: qty 2
  Filled 2012-06-21 (×5): qty 4
  Filled 2012-06-21: qty 2
  Filled 2012-06-21 (×4): qty 4
  Filled 2012-06-21: qty 2
  Filled 2012-06-21 (×3): qty 4
  Filled 2012-06-21: qty 2
  Filled 2012-06-21 (×14): qty 4
  Filled 2012-06-21: qty 2
  Filled 2012-06-21 (×12): qty 4

## 2012-06-21 MED ORDER — VANCOMYCIN HCL 1000 MG IV SOLR
750.0000 mg | Freq: Two times a day (BID) | INTRAVENOUS | Status: DC
  Administered 2012-06-21 – 2012-06-22 (×2): 750 mg via INTRAVENOUS
  Filled 2012-06-21 (×2): qty 750

## 2012-06-21 MED ORDER — ONDANSETRON HCL 4 MG/2ML IJ SOLN: 4.0000 mg | Freq: Three times a day (TID) | INTRAMUSCULAR | Status: AC | PRN

## 2012-06-21 MED ORDER — PIPERACILLIN-TAZOBACTAM 3.375 G IVPB
3.3750 g | Freq: Three times a day (TID) | INTRAVENOUS | Status: DC
  Filled 2012-06-21 (×4): qty 50

## 2012-06-21 MED ORDER — HYDROMORPHONE HCL PF 1 MG/ML IJ SOLN
0.5000 mg | Freq: Once | INTRAMUSCULAR | Status: AC
  Administered 2012-06-21: 0.5 mg via INTRAVENOUS
  Filled 2012-06-21: qty 1

## 2012-06-21 NOTE — Progress Notes (Signed)
Pts MRSA swab positive. MRSA standing orders placed, and pt placed on orange contact.

## 2012-06-21 NOTE — ED Notes (Signed)
Flushed  Suprapubic foley and had good return with noted gross amount of blood in it.

## 2012-06-21 NOTE — ED Notes (Signed)
Old bruising noted to left hip. The bruise is greenish in color

## 2012-06-21 NOTE — ED Notes (Addendum)
Patient's foley flushed. Noted good return with hematuria. Leg bag intact

## 2012-06-21 NOTE — ED Notes (Signed)
Pt c/o back pain, pt repositioned, pillow placed behind back for comfort, bed linen changed due to being wet, pt states that she was more comfortable after being repositioned,

## 2012-06-21 NOTE — Progress Notes (Signed)
ANTIBIOTIC CONSULT NOTE - INITIAL  Pharmacy Consult for Vancomycin & Zosyn Indication: UTI  No Known Allergies  Patient Measurements: Height: 5\' 2"  (157.5 cm) Weight: 168 lb 10.4 oz (76.5 kg) IBW/kg (Calculated) : 50.1    Vital Signs: Temp: 97.7 F (36.5 C) (01/26 0752) BP: 102/50 mmHg (01/26 0840) Pulse Rate: 61  (01/26 0840) Intake/Output from previous day:   Intake/Output from this shift: Total I/O In: -  Out: 900 [Urine:900]  Labs:  Basename 06/21/12 0700  WBC 15.4*  HGB 10.2*  PLT 245  LABCREA --  CREATININE 1.24*   Estimated Creatinine Clearance: 55.5 ml/min (by C-G formula based on Cr of 1.24). No results found for this basename: VANCOTROUGH:2,VANCOPEAK:2,VANCORANDOM:2,GENTTROUGH:2,GENTPEAK:2,GENTRANDOM:2,TOBRATROUGH:2,TOBRAPEAK:2,TOBRARND:2,AMIKACINPEAK:2,AMIKACINTROU:2,AMIKACIN:2, in the last 72 hours   Microbiology: Recent Results (from the past 720 hour(s))  CULTURE, BLOOD (ROUTINE X 2)     Status: Normal (Preliminary result)   Collection Time   06/21/12  7:01 AM      Component Value Range Status Comment   Specimen Description BLOOD BOTTLES DRAWN AEROBIC AND ANAEROBIC   Final    Special Requests LEFT ANTECUBITAL 6CC   Final    Culture PENDING   Incomplete    Report Status PENDING   Incomplete   CULTURE, BLOOD (ROUTINE X 2)     Status: Normal (Preliminary result)   Collection Time   06/21/12  7:03 AM      Component Value Range Status Comment   Specimen Description BLOOD BOTTLES DRAWN AEROBIC AND ANAEROBIC   Final    Special Requests LEFT ANTECUBITAL [facility]   Final    Culture PENDING   Incomplete    Report Status PENDING   Incomplete     Medical History: Past Medical History  Diagnosis Date  . Paraplegia (lower)     Medications:  Scheduled:    . baclofen  40 mg Oral QID  . dantrolene  25 mg Oral Daily  . gabapentin  300 mg Oral TID  . [COMPLETED]  HYDROmorphone (DILAUDID) injection  0.5 mg Intravenous Once  . [COMPLETED] lactated ringers   1,000 mL Intravenous Once  . [COMPLETED] lactated ringers  500 mL Intravenous Once  . lactated ringers   Intravenous STAT  . naproxen  500 mg Oral BID WC  . [COMPLETED] ondansetron  4 mg Intravenous Once  . [COMPLETED] piperacillin-tazobactam (ZOSYN)  IV  3.375 g Intravenous Once  . piperacillin-tazobactam (ZOSYN)  IV  3.375 g Intravenous Q8H  . vancomycin  750 mg Intravenous Q12H  . [COMPLETED] vancomycin  1,000 mg Intravenous Once   Assessment: Pyelonephritis, paraplegia following spinal cord injury, suprapubic catheter  Goal of Therapy:  Vancomycin trough level 15-20 mcg/ml  Plan:  Vancomycin 750 mg IV every 12 hours Zosyn 3.375 GM IV every 8 hours Vancomycin trough at steady state Monitor renal function Labs per protocol  Abner Greenspan, Tearia Gibbs Bennett 06/21/2012,10:28 AM

## 2012-06-21 NOTE — ED Notes (Signed)
Patient leg bag changed to foley.

## 2012-06-21 NOTE — ED Notes (Signed)
RN at bedside

## 2012-06-21 NOTE — ED Notes (Signed)
Dr. Everette Rank in room with pt

## 2012-06-21 NOTE — Progress Notes (Signed)
Notified of positive blood cultures by lab. Gram positive cocci in both bottles. On call MD made aware. Patient is currently on both IV Vancomycin and Zosyn for coverage.

## 2012-06-21 NOTE — ED Notes (Signed)
Patient's son states his mother went Thursday to [place] and had her suprapubic cathether changed.noted the stitch on her suprapubic was not intact. Daughter-in -law stated the patient had been pulling on it and pulled it out

## 2012-06-21 NOTE — ED Notes (Addendum)
Pt found on floor, states she had been on the floor x 1 day. Per ems,  family states pt is very confused.  EMS reports that pt was found lying in her own urine. EMS also reported that pts bedroom smelled heavily of urine. Kieth Brightly, RN called pt's son who is supposed to be on his way here to the hospital and son stated that pt has a home health nurse Monday thru Friday and goes to the wound clinic on M,W,F.  Pt told EDP that her son gets mad at her, telling her that she is hallucinating, pt then had tears in her eyes.

## 2012-06-21 NOTE — Progress Notes (Signed)
Pts family member brought Dantrolene from home. Pharmacy called and made aware. Pharmacy tech to come pick up medication.

## 2012-06-21 NOTE — ED Notes (Signed)
Patient has nickel size wound right buttocks
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
## 2012-06-21 NOTE — ED Notes (Signed)
Report given to Michelle, RN in ICU.

## 2012-06-21 NOTE — ED Notes (Signed)
Patient transported to CT 

## 2012-06-21 NOTE — Plan of Care (Signed)
Problem: Phase I Progression Outcomes Goal: Voiding-avoid urinary catheter unless indicated Outcome: Not Applicable Date Met:  123456 Pt has a supra pubic catheter that was placed last week at [facility]

## 2012-06-22 LAB — CBC
HCT: 29.9 % — ABNORMAL LOW (ref 36.0–46.0)
Hemoglobin: 9.5 g/dL — ABNORMAL LOW (ref 12.0–15.0)
MCH: 29.2 pg (ref 26.0–34.0)
MCHC: 31.8 g/dL (ref 30.0–36.0)
MCV: 92 fL (ref 78.0–100.0)

## 2012-06-22 LAB — BASIC METABOLIC PANEL
BUN: 27 mg/dL — ABNORMAL HIGH (ref 6–23)
CO2: 24 mEq/L (ref 19–32)
Chloride: 102 mEq/L (ref 96–112)
Glucose, Bld: 117 mg/dL — ABNORMAL HIGH (ref 70–99)
Potassium: 4.1 mEq/L (ref 3.5–5.1)

## 2012-06-22 MED ORDER — VANCOMYCIN HCL 500 MG IV SOLR
500.0000 mg | Freq: Once | INTRAVENOUS | Status: AC
  Administered 2012-06-22: 500 mg via INTRAVENOUS
  Filled 2012-06-22: qty 500

## 2012-06-22 MED ORDER — VANCOMYCIN HCL 10 G IV SOLR
1250.0000 mg | INTRAVENOUS | Status: DC
  Administered 2012-06-23 – 2012-06-25 (×3): 1250 mg via INTRAVENOUS
  Filled 2012-06-22 (×4): qty 1250

## 2012-06-22 NOTE — Progress Notes (Signed)
Pt transferred to telemetry floor. Report called to [place], Therapist, sports.
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
## 2012-06-22 NOTE — Progress Notes (Signed)
UR Chart Review Completed  

## 2012-06-22 NOTE — Care Management Note (Unsigned)
    Page 1 of 1   06/25/2012     3:27:10 PM   CARE MANAGEMENT NOTE 06/25/2012  Patient:  [name], [name]   Account Number:  [id number]  Date Initiated:  06/22/2012  Documentation initiated by:  Theophilus Kinds  Subjective/Objective Assessment:   Pt admitted from home with pyelonephritis. Pt is currently living with her son and daughter in law. Pt has a CAP aide hours a day, M-F. The children take turns staying with pt on the weekend. Pt does have chronic wounds and visits the wound     Action/Plan:   Care clinic in [place] 3 days a week. Family feels pt should go to SNF for wound care and then transition home but pt has been refusing. CSW is aware and is following pt. Will continue to follow for [facility] needs.   Anticipated DC Date:  06/25/2012   Anticipated DC Plan:  [place] referral  Clinical Social Worker      DC Planning Services  CM consult      Choice offered to / List presented to:             Status of service:  In process, will continue to follow Medicare Important Message given?   (If response is "NO", the following Medicare IM given date fields will be blank) Date Medicare IM given:   Date Additional Medicare IM given:    Discharge Disposition:    Per UR Regulation:    If discussed at Long Length of Stay Meetings, dates discussed:    Comments:  06/25/12 [place], RN BSN CM Pt transfering to [facility] under Hospitalist services.  06/22/12 [place], RN BSN CM

## 2012-06-22 NOTE — Progress Notes (Signed)
Subjective: Patient was admitted with pyelonephritis. She is on IV antibiotics. No fever or chills.  Objective: Vital signs in last 24 hours: Temp:  [98.1 F (36.7 C)-99 F (37.2 C)] 98.1 F (36.7 C) (01/27 0400) Pulse Rate:  [61] 61  (01/26 0840) Resp:  [6-23] 16  (01/27 0600) BP: (82-138)/(40-84) 138/81 mmHg (01/27 0600) SpO2:  [98 %-99 %] 99 % (01/26 1100) Weight:  [76.5 kg (168 lb 10.4 oz)-79.6 kg (175 lb 7.8 oz)] 79.6 kg (175 lb 7.8 oz) (01/27 0500) Weight change:  Last BM Date:  (unknown)  Intake/Output from previous day: 01/26 0701 - 01/27 0700 In: 3580 [P.O.:1330; I.V.:2000; IV Piggyback:250] Out: 1950 [id number]  PHYSICAL EXAM General appearance: alert and no distress Resp: clear to auscultation bilaterally Cardio: S1, S2 normal GI: soft, non-tender; bowel sounds normal; no masses,  no organomegaly Extremities: paraplegic  Lab Results:    @labtest @ [facility] No results found for this basename: PHART,PCO2,PO2ART,TCO2,HCO3 in the last 72 hours CULTURES Recent Results (from the past 240 hour(s))  CULTURE, BLOOD (ROUTINE X 2)     Status: Normal (Preliminary result)   Collection Time   06/21/12  7:01 AM      Component Value Range Status Comment   Specimen Description BLOOD BOTTLES DRAWN AEROBIC AND ANAEROBIC   Final    Special Requests LEFT ANTECUBITAL 6CC   Final    Culture     Final    Value: GRAM POSITIVE COCCI     Gram Stain Report Called to,Read Back By and Verified With: WAGONER,R @ 2150 ON 06/21/12 BY WOODIE,J     GS DONE @ [facility]   Report Status PENDING   Incomplete   CULTURE, BLOOD (ROUTINE X 2)     Status: Normal (Preliminary result)   Collection Time   06/21/12  7:03 AM      Component Value Range Status Comment   Specimen Description BLOOD BOTTLES DRAWN AEROBIC AND ANAEROBIC   Final    Special Requests LEFT ANTECUBITAL 6CC   Final    Culture     Final    Value: GRAM POSITIVE COCCI     Gram Stain Report Called to,Read Back By and Verified With: WAGONER,R  @ 2150 ON 06/21/12 BY WOODIE,J     GS DONE @ [facility]   Report Status PENDING   Incomplete   MRSA PCR SCREENING     Status: Abnormal   Collection Time   06/21/12 12:10 PM      Component Value Range Status Comment   MRSA by PCR POSITIVE (*) NEGATIVE Final    Studies/Results: Ct Abdomen Pelvis W Contrast  06/21/2012  *RADIOLOGY REPORT*  Clinical Data: Right lower quadrant and left lower quadrant abdominal pain, nausea and vomiting.  Confusion.  CT ABDOMEN AND PELVIS WITH CONTRAST  Technique:  Multidetector CT imaging of the abdomen and pelvis was performed following the standard protocol during bolus administration of intravenous contrast.  Contrast:  100 mL of Omnipaque 300 IV contrast  Comparison: None.  Findings: Mild bibasilar atelectasis is noted.  A 2.9 cm cyst is noted at the right breast.  The liver and spleen are unremarkable in appearance.  The gallbladder is within normal limits.  The pancreas and adrenal glands are unremarkable.  There is moderate apparently chronic right-sided hydronephrosis, with significant right-sided renal scarring.  Asymmetric perinephric stranding is noted about the right kidney, with soft tissue stranding tracking along the course of the right ureter. There is a mildly asymmetric wall thickening enhancement along the right  ureter.  This raises question for right-sided ureteritis and possibly mild right-sided pyelonephritis.  A left-sided extrarenal pelvis is noted, without significant left- sided hydronephrosis.  Mild scarring is noted at the upper pole of the left kidney, with a few small cysts seen.  There is also minimal stranding along the distal aspect of the left ureter.  No renal or ureteral stones are identified.  No free fluid is identified.  The small bowel is unremarkable in appearance.  The stomach is within normal limits.  No acute vascular abnormalities are seen.  The appendix is normal in caliber and contains air, without evidence for appendicitis.  The colon is  unremarkable in appearance.  The bladder is decompressed, with a suprapubic catheter in place. Soft tissue stranding about the bladder raises concern for cystitis or possibly significant chronic inflammation.  The uterus is grossly unremarkable in appearance.  The ovaries are mildly asymmetric, left larger than right, without evidence of a suspicious mass.  No inguinal lymphadenopathy is seen.  There is unusual prominence of the left periuterine vasculature, which could reflect pelvic congestion syndrome in the appropriate clinical situation.  Note is made of a prominent decubitus ulceration along the inferior right buttock and proximal posterior thigh, with significant soft tissue inflammation and scattered air extending to the level of the posterior right ischium.  Associated sclerotic change suggests prior osteomyelitis; no definite osseous erosion is characterized on this study, though acute osteomyelitis cannot be excluded on CT. Much more mild soft tissue inflammation is noted at the inferior left buttock.  No acute osseous abnormalities are identified.  There is marked degenerative change involving the endplates at [id number], with diffuse sclerosis and irregularity.  This could conceivably reflect sequelae of diskitis, of indeterminate age.  Would correlate for associated symptoms.  Thoracic rods are noted.  IMPRESSION:  1.  Significant soft tissue inflammation noted about the right renal pelvis and along the course of the right ureter, with wall thickening and enhancement along the right ureter.  This is suspicious for right-sided ureteritis.  Mild asymmetric right-sided perinephric stranding could reflect mild pyelonephritis. 2.  Moderate chronic right-sided hydronephrosis noted, with significant right-sided renal scarring.  Mild scarring noted at the upper pole of the left kidney, with a few small adjacent cysts.  No renal or ureteral stones seen. 3.  Soft tissue stranding about the bladder raises concern  for cystitis, or possibly significant chronic inflammation. 4.  Prominent decubitus ulceration along the inferior right buttock and proximal posterior right thigh, with significant soft tissue inflammation and scattered air extending to the level of the posterior right ischium.  Associated ischial sclerotic change suggests prior osteomyelitis; no definite osseous erosion seen, though acute osteomyelitis cannot be excluded on CT.  Much more mild soft tissue inflammation noted at the inferior left buttock. 5.  Unusual prominence of the left periuterine vasculature, which could reflect pelvic congestion syndrome in the appropriate clinical situation. 6.  Marked degenerative change involving the endplates at X33443, with diffuse sclerosis and irregularity.  This could conceivably reflect sequelae of diskitis, of indeterminate age.  Would correlate for associated symptoms. 7.  2.9 cm right breast cyst is likely benign. 8.  Mild bibasilar atelectasis noted.   Original Report Authenticated By: Santa Lighter, M.D.    Dg Chest Port 1 View  06/21/2012  *RADIOLOGY REPORT*  Clinical Data: Altered mental status; paraplegia.  PORTABLE CHEST - 1 VIEW  Comparison: None.  Findings: The lungs are relatively well expanded.  Mild bibasilar airspace opacities likely  reflect atelectasis, though pneumonia could conceivably have a similar appearance.  No pleural effusion or pneumothorax is seen.  The cardiomediastinal silhouette remains normal in size.  No acute osseous abnormalities are identified.  Thoracic spinal fusion hardware is noted.  IMPRESSION: Mild bibasilar airspace opacities likely reflect atelectasis, though pneumonia could conceivably have a similar appearance.   Original Report Authenticated By: Santa Lighter, M.D.     Medications: I have reviewed the patient's current medications.  Assesment: 1. Pyelonephritis 2.paraplegia 3. S/P suprapubic catheter insertion Active Problems:  * No active hospital problems. *      Plan: Continue IV antibiotics Urology consult Continue regular treatment.    LOS: 1 day   Shelva Hetzer 06/22/2012, 8:08 AM

## 2012-06-22 NOTE — Progress Notes (Signed)
ANTIBIOTIC CONSULT NOTE - INITIAL  Pharmacy Consult for Vancomycin & Zosyn Indication: UTI  No Known Allergies  Patient Measurements: Height: 5\' 2"  (157.5 cm) Weight: 175 lb 7.8 oz (79.6 kg) IBW/kg (Calculated) : 50.1    Vital Signs: Temp: 98.1 F (36.7 C) (01/27 0400) Temp src: Oral (01/27 0400) BP: 138/81 mmHg (01/27 0600) Intake/Output from previous day: 01/26 0701 - 01/27 0700 In: 3580 [P.O.:1330; I.V.:2000; IV Piggyback:250] Out: 1950 [id number] Intake/Output from this shift: Total I/O In: 200 [P.O.:200] Out: -   Labs:  Basename 06/22/12 0416 06/21/12 0700  WBC 13.9* 15.4*  HGB 9.5* 10.2*  PLT 216 245  LABCREA -- --  CREATININE 1.71* 1.24*   Estimated Creatinine Clearance: 41 ml/min (by C-G formula based on Cr of 1.71). No results found for this basename: VANCOTROUGH:2,VANCOPEAK:2,VANCORANDOM:2,GENTTROUGH:2,GENTPEAK:2,GENTRANDOM:2,TOBRATROUGH:2,TOBRAPEAK:2,TOBRARND:2,AMIKACINPEAK:2,AMIKACINTROU:2,AMIKACIN:2, in the last 72 hours   Microbiology: Recent Results (from the past 720 hour(s))  CULTURE, BLOOD (ROUTINE X 2)     Status: Normal (Preliminary result)   Collection Time   06/21/12  7:01 AM      Component Value Range Status Comment   Specimen Description BLOOD BOTTLES DRAWN AEROBIC AND ANAEROBIC   Final    Special Requests LEFT ANTECUBITAL 6CC   Final    Culture     Final    Value: CORRECTED RESULTS GRAM NEGATIVE RODS     CORRECTED RESULTS CALLED TO: [place] RN, AT [id number] ON 06/22/2012 BY  BY BAUGHAM,M.     SMEAR REVIEWED 06/22/2012 BY Elza Rafter.     Performed at [place] Gram Stain Report Called to,Read Back By and Verified With: WAGONER,R. AT 2150 ON 06/21/2012 BY WOODIE,J.   Report Status PENDING   Incomplete   CULTURE, BLOOD (ROUTINE X 2)     Status: Normal (Preliminary result)   Collection Time   06/21/12  7:03 AM      Component Value Range Status Comment   Specimen  Description BLOOD BOTTLES DRAWN AEROBIC AND ANAEROBIC   Final    Special Requests LEFT ANTECUBITAL 6CC   Final    Culture     Final    Value: CORRECTED RESULTS GRAM NEGATIVE RODS     CORRECTED RESULTS CALLED TO: CHILDRESS,JESSICA AT [id number] ON 06/22/2012 BY BAUGHAM,M.     SMEAR REVIEWED 06/22/2012 BY BAUGHAM,M.     Performed at [place] [facility]  GRAM POSITIVE COCCI Gram Stain Report Called to,Read Back By and Verified With: WAGONER,R AT 2150 ON 06/21/2012 BA WODDIE,J.   Report Status PENDING   Incomplete   MRSA PCR SCREENING     Status: Abnormal   Collection Time   06/21/12 12:10 PM      Component Value Range Status Comment   MRSA by PCR POSITIVE (*) NEGATIVE Final     Medical History: Past Medical History  Diagnosis Date  . Paraplegia (lower)     Medications:  Scheduled:     . baclofen  40 mg Oral QID  . Chlorhexidine Gluconate Cloth  6 each Topical Q0600  . dantrolene  25 mg Oral Daily  . gabapentin  300 mg Oral TID  . [EXPIRED] lactated ringers   Intravenous STAT  . mupirocin ointment  1 application Nasal BID  . naproxen  500 mg Oral BID WC  . piperacillin-tazobactam (ZOSYN)  IV  3.375 g Intravenous Q8H  . [DISCONTINUED] dantrolene  25 mg Oral Daily  . [  DISCONTINUED] piperacillin-tazobactam (ZOSYN)  IV  3.375 g Intravenous Q8H  . [DISCONTINUED] vancomycin  750 mg Intravenous Q12H  . [DISCONTINUED] vancomycin  750 mg Intravenous Q12H   Assessment: Pyelonephritis, paraplegia following spinal cord injury, suprapubic catheter.  SCr has bumped up today.  Estimated Creatinine Clearance: 41 ml/min (by C-G formula based on Cr of 1.71).  Goal of Therapy:  Vancomycin trough level 15-20 mcg/ml  Plan:  Modify Vancomycin to 1250 mg IV every 24 hours Continue Zosyn 3.375 GM IV every 8 hours Vancomycin trough at steady state Monitor renal function Labs per protocol  Hart Robinsons A 06/22/2012,11:01 AM

## 2012-06-23 LAB — BASIC METABOLIC PANEL
CO2: 24 mEq/L (ref 19–32)
Calcium: 8.5 mg/dL (ref 8.4–10.5)
Creatinine, Ser: 1.24 mg/dL — ABNORMAL HIGH (ref 0.50–1.10)
Glucose, Bld: 132 mg/dL — ABNORMAL HIGH (ref 70–99)
Sodium: 141 mEq/L (ref 135–145)

## 2012-06-23 MED ORDER — FESOTERODINE FUMARATE ER 4 MG PO TB24
4.0000 mg | ORAL_TABLET | Freq: Every day | ORAL | Status: DC
  Administered 2012-06-23 – 2012-06-30 (×8): 4 mg via ORAL
  Filled 2012-06-23 (×9): qty 1

## 2012-06-23 NOTE — Consult Note (Signed)
Note dictated#[id number]

## 2012-06-23 NOTE — Progress Notes (Signed)
Inpatient Diabetes Program Recommendations  AACE/ADA: New Consensus Statement on Inpatient Glycemic Control (2013)  Target Ranges:  Prepandial:   less than 140 mg/dL      Peak postprandial:   less than 180 mg/dL (1-2 hours)      Critically ill patients:  140 - 180 mg/dL   Results for LOLITA, LECKER (MRN [id number]) as of 06/23/2012 08:11  Ref. Range 06/22/2012 11:00  Glucose-Capillary Latest Range: 70-99 mg/dL 184 (H)   Results for SHALLON, SCHAEFFER (MRN [id number]) as of 06/23/2012 08:11  Ref. Range 06/23/2012 05:06  Glucose Latest Range: 70-99 mg/dL 132 (H)   Inpatient Diabetes Program Recommendations HgbA1C: Please consider ordering an A1C to determine glycemic control over the last 2-3 months.  Note:  Patient does not have a documented history of diabetes.  Fasting blood glucose this morning was 132 mg/dl.  Please consider ordering an A1C to determine glycemic control over the past 2-3 months.  Will continue to follow if necessary.    Thanks, Barnie Alderman, RN, BSN, [place] Diabetes Coordinator Inpatient Diabetes Program [phone]
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
## 2012-06-23 NOTE — Clinical Social Work Note (Signed)
CSW attempted to meet w patient today, but patient remains confused.  Will recontact tomorrow.  Edwyna Shell, LCSW Clinical Social Worker [phone])

## 2012-06-23 NOTE — Consult Note (Signed)
Serum creatinine is 1.24 not 1.4

## 2012-06-23 NOTE — Progress Notes (Signed)
Subjective: Patient is resting. She feels better. She is receiving IV antibiotics. She growing Gram- bacteria.No fever or chills.  Objective: Vital signs in last 24 hours: Temp:  [97.9 F (36.6 C)-98.5 F (36.9 C)] 97.9 F (36.6 C) (01/28 0634) Pulse Rate:  [94-101] 101  (01/28 0634) Resp:  [6-20] 20  (01/28 0634) BP: (92-124)/(58-106) 112/77 mmHg (01/28 0634) SpO2:  [92 %-97 %] 96 % (01/28 0634) Weight change:  Last BM Date:  (unknown)  Intake/Output from previous day: 01/27 0701 - 01/28 0700 In: 470 [P.O.:320; IV Piggyback:150] Out: 2650 [Urine:2650]  PHYSICAL EXAM General appearance: alert and no distress Resp: clear to auscultation bilaterally Cardio: S1, S2 normal GI: soft, non-tender; bowel sounds normal; no masses,  no organomegaly Extremities: paraplegic  Lab Results:    @labtest @ [facility] No results found for this basename: PHART,PCO2,PO2ART,TCO2,HCO3 in the last 72 hours CULTURES Recent Results (from the past 240 hour(s))  CULTURE, BLOOD (ROUTINE X 2)     Status: Normal (Preliminary result)   Collection Time   06/21/12  7:01 AM      Component Value Range Status Comment   Specimen Description BLOOD LEFT ANTECUBITAL   Final    Special Requests BOTTLES DRAWN AEROBIC AND ANAEROBIC [facility]   Final    Culture  Setup Time 06/21/2012 21:05   Final    Culture     Final    Value: GRAM NEGATIVE RODS     Note: CORRECTED RESULTS CALLED TO: CHILDRESS JESSICA RN AT 1042ON 06/22/2012 BY [name] Schimke M. GRAM STAIN REPORTED PREVIOUSLY AS GRAM POSITIVE COCCI Gram Stain Report Called to,Read Back By and Verified With: WAGONER R. AT 2150 ON 06/21/2012 BY WOODIE  J.   Report Status PENDING   Incomplete   CULTURE, BLOOD (ROUTINE X 2)     Status: Normal (Preliminary result)   Collection Time   06/21/12  7:03 AM      Component Value Range Status Comment   Specimen Description BLOOD LEFT ANTECUBITAL   Final    Special Requests BOTTLES DRAWN AEROBIC AND ANAEROBIC [facility]   Final    Culture  Setup  Time 06/22/2012 15:41   Final    Culture     Final    Value: GRAM NEGATIVE RODS     Note: CORRECTED RESULTS CALLED TO: CHILDRESS JESSICA AT [id number] ON 06/22/2012 BY BAUGHAM M. SMEAR REVIEWED 06/22/2012 BY [name] Schimke M. GRAM STAIN REPORTED PREVIOUSLY AS  GRAM POSITIVE COCCI Gram Stain Report Called to,Read Back By and Verified With: WAGONER R.      AT 2150 ON 01/26/2014BY WOODIE J.   Report Status PENDING   Incomplete   MRSA PCR SCREENING     Status: Abnormal   Collection Time   06/21/12 12:10 PM      Component Value Range Status Comment   MRSA by PCR POSITIVE (*) NEGATIVE Final    Studies/Results: No results found.  Medications: I have reviewed the patient's current medications.  Assesment: 1. Pyelonephritis 2.paraplegia 3. S/P suprapubic catheter insertion 4. Sepsis Active Problems:  * No active hospital problems. *     Plan: Continue IV antibiotics Urology consult Continue regular treatment.    LOS: 2 days   [name] 06/23/2012, 8:06 AM

## 2012-06-23 NOTE — Consult Note (Signed)
Addendum to consult.I have reviewed ct abdomen and pelvis It shows r hydronephrosis and r perinephric and r ureteris no sone. It could be reflux or just pyelonephris . Will wait and see how she responds to antibiotics. Her serum creatinine is 1.4

## 2012-06-24 LAB — URINE CULTURE: Culture: NO GROWTH

## 2012-06-24 LAB — BASIC METABOLIC PANEL
Calcium: 8.3 mg/dL — ABNORMAL LOW (ref 8.4–10.5)
Chloride: 105 mEq/L (ref 96–112)
Creatinine, Ser: 0.91 mg/dL (ref 0.50–1.10)
GFR calc Af Amer: 88 mL/min — ABNORMAL LOW (ref >90)

## 2012-06-24 LAB — VANCOMYCIN, TROUGH: Vancomycin Tr: 11 ug/mL (ref 10.0–20.0)

## 2012-06-24 MED ORDER — SODIUM CHLORIDE 0.9 % IJ SOLN
3.0000 mL | INTRAMUSCULAR | Status: DC | PRN
  Administered 2012-06-24: 3 mL via INTRAVENOUS

## 2012-06-24 NOTE — Progress Notes (Signed)
Subjective: Patient is resting. She is resting. She is growing acetobacter but sensitivity is pending. She slightly confused and disoriented. Objective: Vital signs in last 24 hours: Temp:  [97.5 F (36.4 C)-98.4 F (36.9 C)] 97.6 F (36.4 C) (01/29 0444) Pulse Rate:  [73-97] 73  (01/29 0444) Resp:  [18-20] 20  (01/29 0444) BP: (110-139)/(71-87) 139/87 mmHg (01/29 0444) SpO2:  [97 %-99 %] 98 % (01/29 0444) Weight change:  Last BM Date: 06/24/12  Intake/Output from previous day: 01/28 0701 - 01/29 0700 In: 880 [P.O.:480; IV Piggyback:400] Out: 3000 [Urine:3000]  PHYSICAL EXAM General appearance: alert and no distress Resp: clear to auscultation bilaterally Cardio: S1, S2 normal GI: soft, non-tender; bowel sounds normal; no masses,  no organomegaly Extremities: paraplegic  Lab Results:    @labtest @ [facility] No results found for this basename: PHART,PCO2,PO2ART,TCO2,HCO3 in the last 72 hours CULTURES Recent Results (from the past 240 hour(s))  CULTURE, BLOOD (ROUTINE X 2)     Status: Normal (Preliminary result)   Collection Time   06/21/12  7:01 AM      Component Value Range Status Comment   Specimen Description BLOOD LEFT ANTECUBITAL   Final    Special Requests BOTTLES DRAWN AEROBIC AND ANAEROBIC [facility]   Final    Culture  Setup Time 06/21/2012 21:05   Final    Culture     Final    Value: ACINETOBACTER CALCOACETICUS/BAUMANNII COMPLEX     Note: CORRECTED RESULTS CALLED TO: CHILDRESS JESSICA RN AT 1042ON 06/22/2012 BY [name] Schimke M. GRAM STAIN REPORTED PREVIOUSLY AS GRAM POSITIVE COCCI Gram Stain Report Called to,Read Back By and Verified With: WAGONER R. AT 2150 ON 06/21/2012 BY WOODIE  J.   Report Status PENDING   Incomplete   CULTURE, BLOOD (ROUTINE X 2)     Status: Normal (Preliminary result)   Collection Time   06/21/12  7:03 AM      Component Value Range Status Comment   Specimen Description BLOOD LEFT ANTECUBITAL   Final    Special Requests BOTTLES DRAWN AEROBIC AND  ANAEROBIC [facility]   Final    Culture  Setup Time 06/22/2012 15:41   Final    Culture     Final    Value: ACINETOBACTER CALCOACETICUS/BAUMANNII COMPLEX     Note: CORRECTED RESULTS CALLED TO: CHILDRESS JESSICA AT [id number] ON 06/22/2012 BY BAUGHAM M. SMEAR REVIEWED 06/22/2012 BY [name] Schimke M. GRAM STAIN REPORTED PREVIOUSLY AS  GRAM POSITIVE COCCI Gram Stain Report Called to,Read Back By and Verified With: WAGONER R.      AT 2150 ON 01/26/2014BY WOODIE J.   Report Status PENDING   Incomplete   MRSA PCR SCREENING     Status: Abnormal   Collection Time   06/21/12 12:10 PM      Component Value Range Status Comment   MRSA by PCR POSITIVE (*) NEGATIVE Final    Studies/Results: No results found.  Medications: I have reviewed the patient's current medications.  Assesment: 1. Pyelonephritis 2.paraplegia 3. S/P suprapubic catheter insertion 4. Sepsis 5. Decubitus ulcer on the rt buttock Active Problems:  * No active hospital problems. *     Plan: Continue IV antibiotics Urology consult appreciated Wound care Continue regular treatment.    LOS: 3 days   Jacqualin Shirkey 06/24/2012, 8:37 AM

## 2012-06-24 NOTE — Progress Notes (Signed)
ANTIBIOTIC CONSULT NOTE  Pharmacy Consult for Vancomycin & Zosyn Indication: UTI  No Known Allergies  Patient Measurements: Height: 5\' 2"  (157.5 cm) Weight: 175 lb 7.8 oz (79.6 kg) IBW/kg (Calculated) : 50.1    Vital Signs: Temp: 97.7 F (36.5 C) (01/29 1301) Temp src: Oral (01/29 1301) BP: 129/86 mmHg (01/29 1301) Pulse Rate: 93  (01/29 1301) Intake/Output from previous day: 01/28 0701 - 01/29 0700 In: 880 [P.O.:480; IV Piggyback:400] Out: 3000 [Urine:3000] Intake/Output from this shift: Total I/O In: 120 [P.O.:120] Out: 600 [Urine:600]  Labs:  [facility] 06/24/12 0348 06/23/12 0506 06/22/12 0416  WBC -- -- 13.9*  HGB -- -- 9.5*  PLT -- -- 216  LABCREA -- -- --  CREATININE 0.91 1.24* 1.71*   Estimated Creatinine Clearance: 77.1 ml/min (by C-G formula based on Cr of 0.91).  Basename 06/24/12 1200  VANCOTROUGH 11.0  VANCOPEAK --  VANCORANDOM --  GENTTROUGH --  GENTPEAK --  GENTRANDOM --  TOBRATROUGH --  TOBRAPEAK --  TOBRARND --  AMIKACINPEAK --  AMIKACINTROU --  AMIKACIN --     Microbiology: Recent Results (from the past 720 hour(s))  CULTURE, BLOOD (ROUTINE X 2)     Status: Normal (Preliminary result)   Collection Time   06/21/12  7:01 AM      Component Value Range Status Comment   Specimen Description BLOOD LEFT ANTECUBITAL   Final    Special Requests BOTTLES DRAWN AEROBIC AND ANAEROBIC [facility]   Final    Culture  Setup Time 06/21/2012 21:05   Final    Culture     Final    Value: ACINETOBACTER CALCOACETICUS/BAUMANNII COMPLEX     Note: CORRECTED RESULTS CALLED TO: CHILDRESS JESSICA RN AT [id number] 06/22/2012 BY [name] Schimke M. GRAM STAIN REPORTED PREVIOUSLY AS GRAM POSITIVE COCCI Gram Stain Report Called to,Read Back By and Verified With: WAGONER R. AT 2150 ON 06/21/2012 BY WOODIE  J.   Report Status PENDING   Incomplete   CULTURE, BLOOD (ROUTINE X 2)     Status: Normal (Preliminary result)   Collection Time   06/21/12  7:03 AM      Component Value Range Status  Comment   Specimen Description BLOOD LEFT ANTECUBITAL   Final    Special Requests BOTTLES DRAWN AEROBIC AND ANAEROBIC [facility]   Final    Culture  Setup Time 06/22/2012 15:41   Final    Culture     Final    Value: ACINETOBACTER CALCOACETICUS/BAUMANNII COMPLEX     Note: CORRECTED RESULTS CALLED TO: CHILDRESS JESSICA AT [id number] ON 06/22/2012 BY BAUGHAM M. SMEAR REVIEWED 06/22/2012 BY [name] Schimke M. GRAM STAIN REPORTED PREVIOUSLY AS  GRAM POSITIVE COCCI Gram Stain Report Called to,Read Back By and Verified With: WAGONER R.      AT 2150 ON 01/26/2014BY WOODIE J.   Report Status PENDING   Incomplete   MRSA PCR SCREENING     Status: Abnormal   Collection Time   06/21/12 12:10 PM      Component Value Range Status Comment   MRSA by PCR POSITIVE (*) NEGATIVE Final     Medical History: Past Medical History  Diagnosis Date  . Paraplegia (lower)     Medications:  Scheduled:     . baclofen  40 mg Oral QID  . Chlorhexidine Gluconate Cloth  6 each Topical Q0600  . dantrolene  25 mg Oral Daily  . fesoterodine  4 mg Oral Daily  . gabapentin  300 mg Oral TID  . mupirocin ointment  1 application  Nasal BID  . naproxen  500 mg Oral BID WC  . piperacillin-tazobactam (ZOSYN)  IV  3.375 g Intravenous Q8H  . vancomycin  1,250 mg Intravenous Q24H   Assessment: 47 yo F on day #4 Vancomycin & Zosyn for UTI.  Renal function has improved to baseline.  Blood cx is growing acinetobacter (sens pending) but urine cx is negative.  Pt is afebrile and WBC improving.    Goal of Therapy:  Vancomycin trough 10-64mcg/ml  Plan:  Continue Vancomycin 1250 mg IV every 24 hours Continue Zosyn 3.375 GM IV every 8 hours Weekly Vancomycin trough if continues Monitor renal function & cx data Duration of therapy per MD- consider d/c Vancomycin   Biagio Borg 06/24/2012,1:03 PM

## 2012-06-24 NOTE — Clinical Social Work Placement (Signed)
    Clinical Social Work Department CLINICAL SOCIAL WORK PLACEMENT NOTE 06/25/2012  Patient:  [name], [name]  Account Number:  [id number] [place] date:  06/21/2012  Clinical Social Worker:  Edwyna Shell, CLINICAL SOCIAL WORKER  Date/time:  06/24/2012 09:00 AM  Clinical Social Work is seeking post-discharge placement for this patient at the following level of care:   [place]   (*CSW will update this form in Epic as items are completed)   06/24/2012  Patient/family provided with [facility] Department of Clinical Social Work's list of facilities offering this level of care within the geographic area requested by the patient (or if unable, by the patient's family).  06/24/2012  Patient/family informed of their freedom to choose among providers that offer the needed level of care, that participate in Medicare, Medicaid or managed care program needed by the patient, have an available bed and are willing to accept the patient.  06/24/2012  Patient/family informed of [facility]' ownership interest in [facility], as well as of the fact that they are under no obligation to receive care at this facility.  PASARR submitted to EDS on 06/24/2012 PASARR number received from EDS on 06/24/2012  FL2 transmitted to all facilities in geographic area requested by pt/family on  06/24/2012 FL2 transmitted to all facilities within larger geographic area on 06/25/2012  Patient informed that his/her managed care company has contracts with or will negotiate with  certain facilities, including the following:     Patient/family informed of bed offers received:   Patient chooses bed at  Physician recommends and patient chooses bed at    Patient to be transferred to  on   Patient to be transferred to facility by   The following physician request were entered in Epic:   Additional Comments: SNF placement process suspended, patient transferring to [place],  [place] Worker [phone])

## 2012-06-25 ENCOUNTER — Inpatient Hospital Stay (HOSPITAL_COMMUNITY): Payer: Medicaid Other

## 2012-06-25 ENCOUNTER — Encounter (HOSPITAL_COMMUNITY): Payer: Medicaid Other

## 2012-06-25 ENCOUNTER — Encounter (HOSPITAL_COMMUNITY): Payer: Self-pay | Admitting: Family Medicine

## 2012-06-25 DIAGNOSIS — L89309 Pressure ulcer of unspecified buttock, unspecified stage: Secondary | ICD-10-CM | POA: Diagnosis present

## 2012-06-25 DIAGNOSIS — R4182 Altered mental status, unspecified: Secondary | ICD-10-CM | POA: Diagnosis present

## 2012-06-25 DIAGNOSIS — Z9359 Other cystostomy status: Secondary | ICD-10-CM

## 2012-06-25 DIAGNOSIS — R7881 Bacteremia: Secondary | ICD-10-CM

## 2012-06-25 DIAGNOSIS — N12 Tubulo-interstitial nephritis, not specified as acute or chronic: Secondary | ICD-10-CM

## 2012-06-25 DIAGNOSIS — N10 Acute tubulo-interstitial nephritis: Secondary | ICD-10-CM | POA: Diagnosis present

## 2012-06-25 DIAGNOSIS — L8990 Pressure ulcer of unspecified site, unspecified stage: Secondary | ICD-10-CM

## 2012-06-25 DIAGNOSIS — G8220 Paraplegia, unspecified: Secondary | ICD-10-CM | POA: Diagnosis present

## 2012-06-25 LAB — CULTURE, BLOOD (ROUTINE X 2)

## 2012-06-25 MED ORDER — MORPHINE SULFATE 2 MG/ML IJ SOLN
1.0000 mg | Freq: Once | INTRAMUSCULAR | Status: AC
  Administered 2012-06-25: 1 mg via INTRAVENOUS
  Filled 2012-06-25: qty 1

## 2012-06-25 MED ORDER — SODIUM CHLORIDE 0.9 % IJ SOLN
10.0000 mL | INTRAMUSCULAR | Status: DC | PRN
  Administered 2012-06-30: 10 mL

## 2012-06-25 MED ORDER — PIPERACILLIN-TAZOBACTAM 3.375 G IVPB
3.3750 g | Freq: Three times a day (TID) | INTRAVENOUS | Status: DC
  Administered 2012-06-25 – 2012-06-26 (×3): 3.375 g via INTRAVENOUS
  Filled 2012-06-25 (×7): qty 50

## 2012-06-25 MED ORDER — HEPARIN SODIUM (PORCINE) 5000 UNIT/ML IJ SOLN
5000.0000 [IU] | Freq: Three times a day (TID) | INTRAMUSCULAR | Status: DC
  Administered 2012-06-25 – 2012-06-30 (×15): 5000 [IU] via SUBCUTANEOUS
  Filled 2012-06-25 (×17): qty 1

## 2012-06-25 MED ORDER — SODIUM CHLORIDE 0.9 % IJ SOLN
10.0000 mL | Freq: Two times a day (BID) | INTRAMUSCULAR | Status: DC
  Administered 2012-06-27 – 2012-06-29 (×2): 10 mL

## 2012-06-25 MED ORDER — ZOLPIDEM TARTRATE 5 MG PO TABS: 5.0000 mg | ORAL_TABLET | Freq: Every evening | ORAL | Status: DC | PRN

## 2012-06-25 NOTE — Progress Notes (Signed)
Patients zosyn rescheduled. Off schedule due to loss of IV access. PICC line obtained.

## 2012-06-25 NOTE — Progress Notes (Signed)
Per Joaquim Lai, RN, admission nurse to come and complete the patient's admission documentation.  Oncoming nurse notified.  Will continue to monitor.
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
## 2012-06-25 NOTE — Clinical Social Work Note (Signed)
Patient transferring today to [facility], SNF placement process suspended at this time.  CSW signing off at this time.  Edwyna Shell, LCSW Clinical Social Worker [phone])

## 2012-06-25 NOTE — Progress Notes (Signed)
UR Chart Review Completed  

## 2012-06-25 NOTE — Progress Notes (Signed)
IV access lost at 4:30 am. Three RNs attempted to gain IV access, no success. Possible PICC insertion discussed with AC and report given to day nurse.

## 2012-06-25 NOTE — Clinical Social Work Note (Signed)
CSW has sought placement for patient at SNFs in [place], [place] and [place] counties.  No facilities have been willing to accept patient.  No bed offers at this time.  Edwyna Shell, LCSW Clinical Social Worker [phone])

## 2012-06-25 NOTE — Progress Notes (Signed)
Contacted Dr. [place] Pink regarding the active orders from patient's hospitalization at [facility].  Dr. [place] Pink advised to continue all active orders that patient had at [facility].  Will continue to monitor.

## 2012-06-25 NOTE — Progress Notes (Signed)
Patient arrived via stretcher from [place] and oriented x4.  Vitals stable.  Patient oriented to room and call bell.  PICC present in right upper arm.  Wound to left buttocks, wound to dorsum of right foot, stage I pressure ulcer to right heel, suprapubic catheter intact, scabbing to right elbow, patient on menses.  Dr. Maryland Pink notified of patient's arrival; MD to assess patient.  Will continue to monitor.

## 2012-06-25 NOTE — Progress Notes (Signed)
Patient transferred to [facility]. Report given to RN on 6N. Patient taken by [facility] in stable condition. Joelene Millin, daughter, notified of patients room and transfer.

## 2012-06-25 NOTE — Progress Notes (Signed)
Contacted by Dr. Legrand Rams regarding [name] acute admission due to pyelonephritis and bacteremia, with isolated acinetobacter microorganism on blood; is multiresistant and only sensitive to colistin. ID was consulted and has recommended to transfer patient to [facility] for further evaluation and treatment. Patient accepted to [facility], regular floor and under Dr. Maryland Pink as an attending.  [name] [phone]

## 2012-06-25 NOTE — Progress Notes (Signed)
Subjective: Patient is more alert and awake. She is resting and receiving IV antibiotics and wound care. Objective: Vital signs in last 24 hours: Temp:  [97.6 F (36.4 C)-97.8 F (36.6 C)] 97.8 F (36.6 C) (01/30 0623) Pulse Rate:  [73-93] 80  (01/30 0623) Resp:  [18-20] 20  (01/30 0623) BP: (116-129)/(74-86) 116/75 mmHg (01/30 0623) SpO2:  [95 %-98 %] 95 % (01/30 [id number]) Weight change:  Last BM Date: 06/24/12  Intake/Output from previous day: 01/29 0701 - 01/30 0700 In: 360 [P.O.:360] Out: 2800 [Urine:2800]  PHYSICAL EXAM General appearance: alert and no distress Resp: clear to auscultation bilaterally Cardio: S1, S2 normal GI: soft, non-tender; bowel sounds normal; no masses,  no organomegaly Extremities: paraplegic  Lab Results:    @labtest @ [facility] No results found for this basename: PHART,PCO2,PO2ART,TCO2,HCO3 in the last 72 hours CULTURES Recent Results (from the past 240 hour(s))  CULTURE, BLOOD (ROUTINE X 2)     Status: Normal (Preliminary result)   Collection Time   06/21/12  7:01 AM      Component Value Range Status Comment   Specimen Description BLOOD LEFT ANTECUBITAL   Final    Special Requests BOTTLES DRAWN AEROBIC AND ANAEROBIC [facility]   Final    Culture  Setup Time 06/21/2012 21:05   Final    Culture     Final    Value: ACINETOBACTER CALCOACETICUS/BAUMANNII COMPLEX     Note: CORRECTED RESULTS CALLED TO: CHILDRESS JESSICA RN AT 1042ON 06/22/2012 BY [name] Schimke M. GRAM STAIN REPORTED PREVIOUSLY AS GRAM POSITIVE COCCI Gram Stain Report Called to,Read Back By and Verified With: WAGONER R. AT 2150 ON 06/21/2012 BY WOODIE  J.   Report Status PENDING   Incomplete   CULTURE, BLOOD (ROUTINE X 2)     Status: Normal (Preliminary result)   Collection Time   06/21/12  7:03 AM      Component Value Range Status Comment   Specimen Description BLOOD LEFT ANTECUBITAL   Final    Special Requests BOTTLES DRAWN AEROBIC AND ANAEROBIC [facility]   Final    Culture  Setup Time 06/22/2012 15:41    Final    Culture     Final    Value: ACINETOBACTER CALCOACETICUS/BAUMANNII COMPLEX     Note: CORRECTED RESULTS CALLED TO: CHILDRESS JESSICA AT [id number] ON 06/22/2012 BY BAUGHAM M. SMEAR REVIEWED 06/22/2012 BY [name] Schimke M. GRAM STAIN REPORTED PREVIOUSLY AS  GRAM POSITIVE COCCI Gram Stain Report Called to,Read Back By and Verified With: WAGONER R.      AT 2150 ON 01/26/2014BY WOODIE J.   Report Status PENDING   Incomplete   MRSA PCR SCREENING     Status: Abnormal   Collection Time   06/21/12 12:10 PM      Component Value Range Status Comment   MRSA by PCR POSITIVE (*) NEGATIVE Final   URINE CULTURE     Status: Normal   Collection Time   06/23/12  3:45 PM      Component Value Range Status Comment   Specimen Description URINE, SUPRAPUBIC   Final    Special Requests NONE   Final    Culture  Setup Time 06/24/2012 01:44   Final    Colony Count NO GROWTH   Final    Culture NO GROWTH   Final    Report Status 06/24/2012 FINAL   Final    Studies/Results: No results found.  Medications: I have reviewed the patient's current medications.  Assesment: 1. Pyelonephritis 2.paraplegia 3. S/P suprapubic catheter insertion 4. Sepsis 5.  Decubitus ulcer on the rt buttock Active Problems:  * No active hospital problems. *     Plan: Continue IV antibiotics Wound care Continue regular treatment. nursinghome placement.    LOS: 4 days   Crispin Vogel 06/25/2012, 8:19 AM

## 2012-06-25 NOTE — Progress Notes (Signed)
Called Dr. Legrand Rams to clarify type of bed needed. Regular bed-no tele needed. [facility] notified.

## 2012-06-26 LAB — CULTURE, BLOOD (ROUTINE X 2)

## 2012-06-26 LAB — CBC
Platelets: 238 10*3/uL (ref 150–400)
RBC: 3.04 MIL/uL — ABNORMAL LOW (ref 3.87–5.11)
RDW: 15.1 % (ref 11.5–15.5)
WBC: 6.1 10*3/uL (ref 4.0–10.5)

## 2012-06-26 LAB — BASIC METABOLIC PANEL
BUN: 12 mg/dL (ref 6–23)
GFR calc Af Amer: >90 mL/min (ref >90)
Glucose, Bld: 111 mg/dL — ABNORMAL HIGH (ref 70–99)
Potassium: 4.2 mEq/L (ref 3.5–5.1)

## 2012-06-26 MED ORDER — TIGECYCLINE 50 MG IV SOLR
100.0000 mg | Freq: Once | INTRAVENOUS | Status: AC
  Administered 2012-06-26: 100 mg via INTRAVENOUS
  Filled 2012-06-26: qty 100

## 2012-06-26 MED ORDER — SODIUM CHLORIDE 0.9 % IV SOLN
80.0000 mg | Freq: Three times a day (TID) | INTRAVENOUS | Status: DC
  Administered 2012-06-26: 80 mg via INTRAVENOUS
  Filled 2012-06-26 (×3): qty 1.07

## 2012-06-26 MED ORDER — TIGECYCLINE 50 MG IV SOLR
50.0000 mg | Freq: Two times a day (BID) | INTRAVENOUS | Status: DC
  Administered 2012-06-27 – 2012-06-30 (×8): 50 mg via INTRAVENOUS
  Filled 2012-06-26 (×9): qty 100
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
## 2012-06-26 NOTE — Progress Notes (Signed)
ANTIBIOTIC CONSULT NOTE - INITIAL  Pharmacy Consult for IV colistin Indication: bacteremia with pan-resistant Acinitobacter Baumanii   No Known Allergies  Patient Measurements: Height: 5\' 2"  (157.5 cm) Weight: 175 lb 7.8 oz (79.6 kg) IBW/kg (Calculated) : 50.1  Adjusted Body Weight: 60 kg  Vital Signs: Temp: 99.1 F (37.3 C) (01/31 0544) Temp src: Oral (01/31 0544) BP: 114/73 mmHg (01/31 0544) Pulse Rate: 85  (01/31 0544) Intake/Output from previous day: 01/30 0701 - 01/31 0700 In: 480 [P.O.:480] Out: 3550 [Urine:3550] Intake/Output from this shift:    Labs:  Basename 06/26/12 0610 06/24/12 0348  WBC 6.1 --  HGB 8.8* --  PLT 238 --  LABCREA -- --  CREATININE 0.80 0.91   Estimated Creatinine Clearance: 87.7 ml/min (by C-G formula based on Cr of 0.8).  Basename 06/24/12 1200  VANCOTROUGH 11.0  VANCOPEAK --  VANCORANDOM --  GENTTROUGH --  GENTPEAK --  GENTRANDOM --  TOBRATROUGH --  TOBRAPEAK --  TOBRARND --  AMIKACINPEAK --  AMIKACINTROU --  AMIKACIN --     Microbiology: Recent Results (from the past 720 hour(s))  CULTURE, BLOOD (ROUTINE X 2)     Status: Normal   Collection Time   06/21/12  7:01 AM      Component Value Range Status Comment   Specimen Description BLOOD LEFT ANTECUBITAL   Final    Special Requests BOTTLES DRAWN AEROBIC AND ANAEROBIC [facility]   Final    Culture  Setup Time 06/21/2012 21:05   Final    Culture     Final    Value: ACINETOBACTER CALCOACETICUS/BAUMANNII COMPLEX     2 Note: COLISTIN = 0.094ug/ml(SENSITIVE) ETEST results for this drug are "FOR INVESTIGATIONAL USE ONLY" and should NOT be used for clinical purposes. DOXYCYCLINE  RESISTANT TIGEGCYCLINE S CRITICAL RESULT CALLED TO, READ BACK BY AND VERIFIED WITH: NADINE      (NURSE)06/24/12 BY SMITHERSJ     Note: CORRECTED RESULTS CALLED TO: CHILDRESS JESSICA RN AT 1042ON 06/22/2012 BY Elza Rafter. GRAM STAIN REPORTED PREVIOUSLY AS GRAM POSITIVE COCCI Gram Stain Report Called to,Read Back By  and Verified With: WAGONER R. AT 2150 ON 06/21/2012 BY WOODIE  J.   Report Status 06/26/2012 FINAL   Final    Organism ID, Bacteria ACINETOBACTER CALCOACETICUS/BAUMANNII COMPLEX   Final   CULTURE, BLOOD (ROUTINE X 2)     Status: Normal   Collection Time   06/21/12  7:03 AM      Component Value Range Status Comment   Specimen Description BLOOD LEFT ANTECUBITAL   Final    Special Requests BOTTLES DRAWN AEROBIC AND ANAEROBIC [facility]   Final    Culture  Setup Time 06/22/2012 15:41   Final    Culture     Final    Value: ACINETOBACTER CALCOACETICUS/BAUMANNII COMPLEX     Note: SUSCEPTIBILITIES PERFORMED ON PREVIOUS CULTURE WITHIN THE LAST 5 DAYS.     Note: CORRECTED RESULTS CALLED TO: CHILDRESS JESSICA AT [id number] ON 06/22/2012 BY BAUGHAM M. SMEAR REVIEWED 06/22/2012 BY [name] Schimke M. GRAM STAIN REPORTED PREVIOUSLY AS  GRAM POSITIVE COCCI Gram Stain Report Called to,Read Back By and Verified With: WAGONER R.      AT 2150 ON 01/26/2014BY WOODIE J.   Report Status 06/25/2012 FINAL   Final   MRSA PCR SCREENING     Status: Abnormal   Collection Time   06/21/12 12:10 PM      Component Value Range Status Comment   MRSA by PCR POSITIVE (*) NEGATIVE Final   URINE CULTURE  Status: Normal   Collection Time   06/23/12  3:45 PM      Component Value Range Status Comment   Specimen Description URINE, SUPRAPUBIC   Final    Special Requests NONE   Final    Culture  Setup Time 06/24/2012 01:44   Final    Colony Count NO GROWTH   Final    Culture NO GROWTH   Final    Report Status 06/24/2012 FINAL   Final     Medical History: Past Medical History  Diagnosis Date  . Paraplegia (lower)     Medications:  Prescriptions prior to admission  Medication Sig Dispense Refill  . baclofen (LIORESAL) 20 MG tablet Take 40 mg by mouth 4 (four) times daily.       . dantrolene (DANTRIUM) 25 MG capsule Take 25 mg by mouth daily.       . diazepam (VALIUM) 5 MG tablet Take 5 mg by mouth every 12 (twelve) hours as needed. For  anxiety      . gabapentin (NEURONTIN) 300 MG capsule Take 300 mg by mouth 3 (three) times daily.        Marland Kitchen HYDROcodone-acetaminophen (NORCO/VICODIN) 5-325 MG per tablet Take 1 tablet by mouth Every 4 hours as needed. Pain.      . lidocaine (LIDODERM) 5 % Place 1 patch onto the skin daily. Remove & Discard patch within 12 hours or as directed by MD      . Multiple Vitamin (MULTIVITAMIN) tablet Take 1 tablet by mouth daily.      . naproxen (NAPROSYN) 500 MG tablet Take 500 mg by mouth 2 (two) times daily with a meal.        . senna-docusate (SENOKOT-S) 8.6-50 MG per tablet Take 1 tablet by mouth at bedtime.      . sulfamethoxazole-trimethoprim (BACTRIM) 400-80 MG per tablet Take 1 tablet by mouth Twice daily. Last filled on 06/16/2012 for 5 day supply.       Assessment: Ms, Consiglio is a 47 yo F transferred from [place] with blood cultures positive for pan-resistant Acinitobacter Baumanii - sensitive to colistin and tigecylcine.  WBC 6.1. Creat 0.8. Her creat cl is ~ 88 ml/min.  Vanc trough was 11 mcg/ml on 1/29 on dose of vanc 1250 mg IV q24.  She was previously on vancomycin/zosyn/po bactrim.  She is paraplegic.   Her TBW is 80 kg. Her IBW is 50 kg.    Goal of Therapy:  Eradication of infection  Plan:  1. Colistin 1.6 mg/kg/IBW IV q8h = colistin 80mg  IV q8h 2. F/u renal function.  Eudelia Bunch, Pharm.D. QP:[id number] 06/26/2012 12:49 PM

## 2012-06-26 NOTE — Progress Notes (Addendum)
Spoke with patient regarding HH needs and agency choice.  She asked that I speak with her Medicaid CAPS Case Manager to find out who she had used previously as that is who she wanted to use again.  CAPS CM:  Sarajane Jews, PHONE: [phone]. 2nd phone for [phone].  Gregary Signs said that [facility] had been used in the past.  Confirmed the patient's address and phone number. Called [facility] with referral. They will start process and await notification of when pt is ready for d/c.   Sarajane Jews has asked that she be contacted when pt is discharging.
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
## 2012-06-28 DIAGNOSIS — Z9359 Other cystostomy status: Secondary | ICD-10-CM
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
## 2012-06-29 DIAGNOSIS — R7881 Bacteremia: Secondary | ICD-10-CM

## 2012-06-29 MED ORDER — JUVEN PO PACK
1.0000 | PACK | Freq: Two times a day (BID) | ORAL | Status: DC
  Administered 2012-06-29 – 2012-06-30 (×2): 1 via ORAL
  Filled 2012-06-29 (×3): qty 1

## 2012-06-29 MED ORDER — ADULT MULTIVITAMIN W/MINERALS CH
1.0000 | ORAL_TABLET | Freq: Every day | ORAL | Status: DC
  Administered 2012-06-29 – 2012-06-30 (×2): 1 via ORAL
  Filled 2012-06-29 (×2): qty 1

## 2012-06-29 NOTE — Progress Notes (Signed)
Patient ID: [name], female   DOB: [birth year], 47 y.o.   MRN: [id number]    [facility]    Date of Admission:  06/21/2012   Total days of antibiotics 9        Day 4 tigecycline         Principal Problem:  *Bacteremia Active Problems:  Paraplegia  Suprapubic catheter  Pyelonephritis, acute  Decubitus ulcer of buttock  Altered mental status      . baclofen  40 mg Oral QID  . dantrolene  25 mg Oral Daily  . fesoterodine  4 mg Oral Daily  . gabapentin  300 mg Oral TID  . heparin subcutaneous  5,000 Units Subcutaneous Q8H  . naproxen  500 mg Oral BID WC  . sodium chloride  10-40 mL Intracatheter Q12H  . tigecycline (TYGACIL) IVPB  50 mg Intravenous Q12H    Subjective: She says she is feeling better but she is worried because she thinks she started having diarrhea today. She does not recall much in the week before being admitted. Her son and daughter have told her that she was delusional. She has been receiving wound care at the [facility] in [place], [place] and was told recently that her wound is improving.  Objective: Temp:  [98.1 F (36.7 C)-98.8 F (37.1 C)] 98.7 F (37.1 C) (02/03 0607) Pulse Rate:  [85-97] 87  (02/03 0607) Resp:  [15-18] 16  (02/03 0607) BP: (115-127)/(69-80) 117/69 mmHg (02/03 0607) SpO2:  [98 %] 98 % (02/03 0607)  General: Alert comfortable Skin: No rash, right arm PICC Lungs: Clear Cor: Regular S1-S2 no murmurs Abdomen: Soft Wound care nurses description of her sacral wound does not suggest that it is infected  Lab Results Lab Results  Component Value Date   WBC 6.1 06/26/2012   HGB 8.8* 06/26/2012   HCT 28.0* 06/26/2012   MCV 92.1 06/26/2012   PLT 238 06/26/2012    Lab Results  Component Value Date   CREATININE 0.80 06/26/2012   BUN 12 06/26/2012   NA 137 06/26/2012   K 4.2 06/26/2012   CL 103 06/26/2012   CO2 29 06/26/2012    Lab Results  Component Value Date   ALT 25 06/21/2012   AST 32 06/21/2012     ALKPHOS 111 06/21/2012   BILITOT 0.4 06/21/2012      Microbiology: Recent Results (from the past 240 hour(s))  CULTURE, BLOOD (ROUTINE X 2)     Status: Normal   Collection Time   06/21/12  7:01 AM      Component Value Range Status Comment   Specimen Description BLOOD LEFT ANTECUBITAL   Final    Special Requests BOTTLES DRAWN AEROBIC AND ANAEROBIC [facility]   Final    Culture  Setup Time 06/21/2012 21:05   Final    Culture     Final    Value: ACINETOBACTER CALCOACETICUS/BAUMANNII COMPLEX     Note: COLISTIN = 0.094ug/ml(SENSITIVE) ETEST results for this drug are "FOR INVESTIGATIONAL USE ONLY" and should NOT be used for clinical purposes. DOXYCYCLINE  RESISTANT TIGEGCYCLINE = 2 CRITICAL RESULT CALLED TO, READ BACK BY AND VERIFIED WITH: NADINE      (NURSE)06/24/12 BY SMITHERSJ     Note: CORRECTED RESULTS CALLED TO: CHILDRESS JESSICA RN AT 1042ON 06/22/2012 BY [name] Schimke M. GRAM STAIN REPORTED PREVIOUSLY AS GRAM POSITIVE COCCI Gram Stain Report Called to,Read Back By and Verified With: WAGONER R. AT 2150 ON 06/21/2012 BY WOODIE  J.   Report  Status 06/26/2012 FINAL   Final    Organism ID, Bacteria ACINETOBACTER CALCOACETICUS/BAUMANNII COMPLEX   Final   CULTURE, BLOOD (ROUTINE X 2)     Status: Normal   Collection Time   06/21/12  7:03 AM      Component Value Range Status Comment   Specimen Description BLOOD LEFT ANTECUBITAL   Final    Special Requests BOTTLES DRAWN AEROBIC AND ANAEROBIC [facility]   Final    Culture  Setup Time 06/22/2012 15:41   Final    Culture     Final    Value: ACINETOBACTER CALCOACETICUS/BAUMANNII COMPLEX     Note: SUSCEPTIBILITIES PERFORMED ON PREVIOUS CULTURE WITHIN THE LAST 5 DAYS.     Note: CORRECTED RESULTS CALLED TO: CHILDRESS JESSICA AT [id number] ON 06/22/2012 BY BAUGHAM M. SMEAR REVIEWED 06/22/2012 BY [name] Schimke M. GRAM STAIN REPORTED PREVIOUSLY AS  GRAM POSITIVE COCCI Gram Stain Report Called to,Read Back By and Verified With: WAGONER R.      AT 2150 ON 01/26/2014BY WOODIE J.   Report  Status 06/25/2012 FINAL   Final   MRSA PCR SCREENING     Status: Abnormal   Collection Time   06/21/12 12:10 PM      Component Value Range Status Comment   MRSA by PCR POSITIVE (*) NEGATIVE Final   URINE CULTURE     Status: Normal   Collection Time   06/23/12  3:45 PM      Component Value Range Status Comment   Specimen Description URINE, SUPRAPUBIC   Final    Special Requests NONE   Final    Culture  Setup Time 06/24/2012 01:44   Final    Colony Count NO GROWTH   Final    Culture NO GROWTH   Final    Report Status 06/24/2012 FINAL   Final   CULTURE, BLOOD (ROUTINE X 2)     Status: Normal (Preliminary result)   Collection Time   06/26/12  4:20 PM      Component Value Range Status Comment   Specimen Description BLOOD LEFT HAND   Final    Special Requests BOTTLES DRAWN AEROBIC AND ANAEROBIC 10CC   Final    Culture  Setup Time 06/26/2012 23:24   Final    Culture     Final    Value:        BLOOD CULTURE RECEIVED NO GROWTH TO DATE CULTURE WILL BE HELD FOR 5 DAYS BEFORE ISSUING A FINAL NEGATIVE REPORT   Report Status PENDING   Incomplete    Assessment: She had transient Acinetobacter bacteremia of unknown source. Her CT scan shows what may be right pyelonephritis. Oddly enough, she had negative blood cultures here prior to receiving effective therapy for Acinetobacter. I recommend about 7-10 days total therapy with tigecycline. Her nurse indicates that she is having more frequent stools today. I will check a C. difficile PCR.  Plan: 1. Continue tigecycline 2. C. difficile PCR  Michel Bickers, MD [facility] [place] [phone] pager   [phone] cell 06/29/2012, 1:52 PM
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
## 2012-06-29 NOTE — Consult Note (Addendum)
WOC consult Note Reason for Consult: Consult requested for right ischium chronic wound.  Pt is followed by the outpatient wound care center in [place].  She states they use silver dressing to pack wound.   Wound type: Stage 4  Pressure Ulcer POA: Yes Measurement: .2X.2X4cm. Wound bed: Unable to visualize inner wound R/T narrow opening. Drainage (amount, consistency, odor) Mod yellow drainage, no odor. Periwound: Intact skin surrounding. Dressing procedure/placement/frequency: Continue present plan of care with Aquacel to provide antimicrobial benefits and absorb drainage.  Pt on air mattress to reduce pressure to site and states she has a gel pad for her wheelchair. Home health states they are attempting to arrange for air mattress to be used at home. She can resume follow-up with outpatient wound care center after discharge. She was incontinent of stool and was cleaned.  She prefers to wear her own diapers from home, even after discussing the reasons this is not best practice.  Inner groin red, macerated, and peeling.  Encouraged to increase airflow and use open, quilted pad under buttocks, but she declined and her diaper was applied as requested. Will not plan to follow further unless re-consulted.  [address] [place], MSN, [place]

## 2012-06-30 DIAGNOSIS — R4182 Altered mental status, unspecified: Secondary | ICD-10-CM

## 2012-06-30 MED ORDER — HEPARIN SOD (PORK) LOCK FLUSH 100 UNIT/ML IV SOLN
250.0000 [IU] | INTRAVENOUS | Status: AC | PRN
  Administered 2012-06-30: 250 [IU]

## 2012-06-30 MED ORDER — TIGECYCLINE 50 MG IV SOLR: 50.0000 mg | Freq: Two times a day (BID) | INTRAVENOUS | Status: DC

## 2012-06-30 MED ORDER — FESOTERODINE FUMARATE ER 4 MG PO TB24: 4.0000 mg | ORAL_TABLET | Freq: Every day | ORAL | Status: DC

## 2012-06-30 NOTE — Consult Note (Signed)
Urology Consult   Physician requesting consult: Regalado  Reason for consult:  Leaking around SP tube  History of Present Illness: [name] is a 47 y.o. with PMH significant for paraplegia and SP tube placement who has been undergoing treatment for sepsis from UTI/pyelonephritis.  She has been improving and is close to being ready for d/c home with [facility] and IV Abx.  Her SP tube was placed in 12/13 at [facility] per pt report.  It has not been changed since placement.  She is taking Toviaz daily.  She did not note leakage from around the catheter until after admission to the hospital.  Urology consult was obtained to address leakage.  Pt is currently resting comfortably and is without complaint.  She denies F/C, headache, dizziness, CP, SOB, and N/V.  Past Medical History  Diagnosis Date  . Paraplegia (lower)     Past Surgical History  Procedure Date  . Back surgery    Home medications:  Prior to Admission medications   Medication Sig Start Date End Date Taking? Authorizing Provider  baclofen (LIORESAL) 20 MG tablet Take 40 mg by mouth 4 (four) times daily.    Yes Historical Provider, MD  dantrolene (DANTRIUM) 25 MG capsule Take 25 mg by mouth daily.    Yes Historical Provider, MD  diazepam (VALIUM) 5 MG tablet Take 5 mg by mouth every 12 (twelve) hours as needed. For anxiety   Yes Historical Provider, MD  gabapentin (NEURONTIN) 300 MG capsule Take 300 mg by mouth 3 (three) times daily.     Yes Historical Provider, MD  HYDROcodone-acetaminophen (NORCO/VICODIN) 5-325 MG per tablet Take 1 tablet by mouth Every 4 hours as needed. Pain. 06/05/12  Yes Historical Provider, MD  lidocaine (LIDODERM) 5 % Place 1 patch onto the skin daily. Remove & Discard patch within 12 hours or as directed by MD   Yes Historical Provider, MD  Multiple Vitamin (MULTIVITAMIN) tablet Take 1 tablet by mouth daily.   Yes Historical Provider, MD  naproxen (NAPROSYN) 500 MG tablet Take 500 mg by mouth 2 (two)  times daily with a meal.     Yes Historical Provider, MD  senna-docusate (SENOKOT-S) 8.6-50 MG per tablet Take 1 tablet by mouth at bedtime.   Yes Historical Provider, MD  sulfamethoxazole-trimethoprim (BACTRIM) 400-80 MG per tablet Take 1 tablet by mouth Twice daily. Last filled on 06/16/2012 for 5 day supply. 06/16/12  Yes Historical Provider, MD     Current Hospital Medications: Scheduled Meds:   . baclofen  40 mg Oral QID  . dantrolene  25 mg Oral Daily  . fesoterodine  4 mg Oral Daily  . gabapentin  300 mg Oral TID  . heparin subcutaneous  5,000 Units Subcutaneous Q8H  . multivitamin with minerals  1 tablet Oral Daily  . naproxen  500 mg Oral BID WC  . nutrition supplement  1 packet Oral BID BM  . sodium chloride  10-40 mL Intracatheter Q12H  . tigecycline (TYGACIL) IVPB  50 mg Intravenous Q12H   Continuous Infusions:  PRN Meds:.diazepam, oxyCODONE-acetaminophen, sodium chloride, sodium chloride, zolpidem  Allergies: No Known Allergies  History reviewed. No pertinent family history.  Social History:  reports that she has never smoked. She does not have any smokeless tobacco history on file. She reports that she does not drink alcohol or use illicit drugs.  ROS: A complete review of systems was performed.  All systems are negative except for pertinent findings as noted.  Physical Exam:  Vital signs in  last 24 hours: Temp:  [98.2 F (36.8 C)-98.7 F (37.1 C)] 98.7 F (37.1 C) (02/04 0601) Pulse Rate:  [79-85] 79  (02/03 2221) Resp:  [15-18] 16  (02/04 0601) BP: (115-138)/(69-80) 138/72 mmHg (02/04 0601) SpO2:  [96 %-98 %] 96 % (02/04 0601) General:  Alert and oriented, No acute distress HEENT: Normocephalic, atraumatic Neck: No JVD or lymphadenopathy Cardiovascular: Regular rate and rhythm Lungs: Clear bilaterally Abdomen: Soft, nontender, nondistended, no abdominal masses SP tube in place with minimal leakage noted; site clean with no evidence of infection Urine in  foley slightly bloody Extremities: No edema; paraplegia of LE Neurologic: Grossly intact with exception of paraplegia  Laboratory Data:  Lab Results  Component Value Date   CREATININE 0.80 06/26/2012    Lab Results  Component Value Date   WBC 6.1 06/26/2012   HGB 8.8* 06/26/2012   HCT 28.0* 06/26/2012   MCV 92.1 06/26/2012   PLT 238 06/26/2012    Recent Results (from the past 240 hour(s))  CULTURE, BLOOD (ROUTINE X 2)     Status: Normal   Collection Time   06/21/12  7:01 AM      Component Value Range Status Comment   Specimen Description BLOOD LEFT ANTECUBITAL   Final    Special Requests BOTTLES DRAWN AEROBIC AND ANAEROBIC [facility]   Final    Culture  Setup Time 06/21/2012 21:05   Final    Culture     Final    Value: ACINETOBACTER CALCOACETICUS/BAUMANNII COMPLEX     Note: COLISTIN = 0.094ug/ml(SENSITIVE) ETEST results for this drug are "FOR INVESTIGATIONAL USE ONLY" and should NOT be used for clinical purposes. DOXYCYCLINE  RESISTANT TIGEGCYCLINE = 2 CRITICAL RESULT CALLED TO, READ BACK BY AND VERIFIED WITH: NADINE      (NURSE)06/24/12 BY SMITHERSJ     Note: CORRECTED RESULTS CALLED TO: CHILDRESS JESSICA RN AT 1042ON 06/22/2012 BY [name] Schimke M. GRAM STAIN REPORTED PREVIOUSLY AS GRAM POSITIVE COCCI Gram Stain Report Called to,Read Back By and Verified With: WAGONER R. AT 2150 ON 06/21/2012 BY WOODIE  J.   Report Status 06/26/2012 FINAL   Final    Organism ID, Bacteria ACINETOBACTER CALCOACETICUS/BAUMANNII COMPLEX   Final   CULTURE, BLOOD (ROUTINE X 2)     Status: Normal   Collection Time   06/21/12  7:03 AM      Component Value Range Status Comment   Specimen Description BLOOD LEFT ANTECUBITAL   Final    Special Requests BOTTLES DRAWN AEROBIC AND ANAEROBIC [facility]   Final    Culture  Setup Time 06/22/2012 15:41   Final    Culture     Final    Value: ACINETOBACTER CALCOACETICUS/BAUMANNII COMPLEX     Note: SUSCEPTIBILITIES PERFORMED ON PREVIOUS CULTURE WITHIN THE LAST 5 DAYS.     Note: CORRECTED  RESULTS CALLED TO: CHILDRESS JESSICA AT [id number] ON 06/22/2012 BY BAUGHAM M. SMEAR REVIEWED 06/22/2012 BY [name] Schimke M. GRAM STAIN REPORTED PREVIOUSLY AS  GRAM POSITIVE COCCI Gram Stain Report Called to,Read Back By and Verified With: WAGONER R.      AT 2150 ON 01/26/2014BY WOODIE J.   Report Status 06/25/2012 FINAL   Final   MRSA PCR SCREENING     Status: Abnormal   Collection Time   06/21/12 12:10 PM      Component Value Range Status Comment   MRSA by PCR POSITIVE (*) NEGATIVE Final   URINE CULTURE     Status: Normal   Collection Time   06/23/12  3:45 PM  Component Value Range Status Comment   Specimen Description URINE, SUPRAPUBIC   Final    Special Requests NONE   Final    Culture  Setup Time 06/24/2012 01:44   Final    Colony Count NO GROWTH   Final    Culture NO GROWTH   Final    Report Status 06/24/2012 FINAL   Final   CULTURE, BLOOD (ROUTINE X 2)     Status: Normal (Preliminary result)   Collection Time   06/26/12  4:20 PM      Component Value Range Status Comment   Specimen Description BLOOD LEFT HAND   Final    Special Requests BOTTLES DRAWN AEROBIC AND ANAEROBIC 10CC   Final    Culture  Setup Time 06/26/2012 23:24   Final    Culture     Final    Value:        BLOOD CULTURE RECEIVED NO GROWTH TO DATE CULTURE WILL BE HELD FOR 5 DAYS BEFORE ISSUING A FINAL NEGATIVE REPORT   Report Status PENDING   Incomplete   CLOSTRIDIUM DIFFICILE BY PCR     Status: Normal   Collection Time   06/29/12  1:52 PM      Component Value Range Status Comment   C difficile by pcr NEGATIVE  NEGATIVE Final     Renal Function:  Basename 06/26/12 0610 06/24/12 0348  CREATININE 0.80 0.91   Estimated Creatinine Clearance: 87.7 ml/min (by C-G formula based on Cr of 0.8).  Radiologic Imaging: No results found.  Procedure:  Previous foley was removed without difficulty.  Area was prepped and draped in sterile fashion.  22 foley with 30cc balloon was replaced in SP site without difficulty.  Balloon  inflated with 30cc sterile saline.  Cloudy/bloody urine noted in tubing.  Pt tolerated well.   Impression/Assessment:  Leaking SP tube in pt with paraplegia and sepsis from UTI  Plan:  SP replaced.  Continue Toviaz QD.  Continue ABx treatment per ID instructions.  Pt instructed to call her urologist at [facility] for a f/u appt next week.     Marcie Bal 06/30/2012, 1:53 PM

## 2012-06-30 NOTE — Progress Notes (Signed)
Patient discharged per EMS.

## 2012-06-30 NOTE — Consult Note (Signed)
Pt seen and examined/ S-p tube chabnged today. She will RTC at [facility], Dr. Carlota Raspberry  For follow-up. She will need s-p tube change q month. This will be arranged via [place].
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
## 2012-06-30 NOTE — Progress Notes (Signed)
Discharge teaching completed including follow up care, medications and Home Health care.  Verbalizes understanding with no further questions.  Home Health set up to see patient at home for IV antibiotics tomorrow.  EMS called for patient transport to home.  Family aware of transport.  Vital signs stable.  Awaiting transport.
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
## 2012-06-30 NOTE — Progress Notes (Signed)
Patient ID: [name], female   DOB: 1965/10/17, 47 y.o.   MRN: [id number] And a    [facility]    Date of Admission:  06/21/2012           Day 5 tigecycline Principal Problem:  *Bacteremia Active Problems:  Paraplegia  Suprapubic catheter  Pyelonephritis, acute  Decubitus ulcer of buttock  Altered mental status      . baclofen  40 mg Oral QID  . dantrolene  25 mg Oral Daily  . fesoterodine  4 mg Oral Daily  . gabapentin  300 mg Oral TID  . heparin subcutaneous  5,000 Units Subcutaneous Q8H  . multivitamin with minerals  1 tablet Oral Daily  . naproxen  500 mg Oral BID WC  . nutrition supplement  1 packet Oral BID BM  . sodium chloride  10-40 mL Intracatheter Q12H  . tigecycline (TYGACIL) IVPB  50 mg Intravenous Q12H    Subjective: She is feeling better.  Objective: Temp:  [98.2 F (36.8 C)-98.7 F (37.1 C)] 98.6 F (37 C) (02/04 1421) Pulse Rate:  [79-88] 88  (02/04 1421) Resp:  [15-18] 18  (02/04 1421) BP: (115-138)/(69-72) 136/72 mmHg (02/04 1421) SpO2:  [96 %-98 %] 98 % (02/04 1421)  General: She is no longer confused  Lab Results Lab Results  Component Value Date   WBC 6.1 06/26/2012   HGB 8.8* 06/26/2012   HCT 28.0* 06/26/2012   MCV 92.1 06/26/2012   PLT 238 06/26/2012     and a and Microbiology: Recent Results (from the past 240 hour(s))  CULTURE, BLOOD (ROUTINE X 2)     Status: Normal   Collection Time   06/21/12  7:01 AM      Component Value Range Status Comment   Specimen Description BLOOD LEFT ANTECUBITAL   Final    Special Requests BOTTLES DRAWN AEROBIC AND ANAEROBIC [facility]   Final    Culture  Setup Time 06/21/2012 21:05   Final    Culture     Final    Value: ACINETOBACTER CALCOACETICUS/BAUMANNII COMPLEX     Note: COLISTIN = 0.094ug/ml(SENSITIVE) ETEST results for this drug are "FOR INVESTIGATIONAL USE ONLY" and should NOT be used for clinical purposes. DOXYCYCLINE  RESISTANT TIGEGCYCLINE = 2 CRITICAL RESULT CALLED TO,  READ BACK BY AND VERIFIED WITH: NADINE      (NURSE)06/24/12 BY SMITHERSJ     Note: CORRECTED RESULTS CALLED TO: CHILDRESS JESSICA RN AT 1042ON 06/22/2012 BY [name] Schimke M. GRAM STAIN REPORTED PREVIOUSLY AS GRAM POSITIVE COCCI Gram Stain Report Called to,Read Back By and Verified With: WAGONER R. AT 2150 ON 06/21/2012 BY WOODIE  J.   Report Status 06/26/2012 FINAL   Final    Organism ID, Bacteria ACINETOBACTER CALCOACETICUS/BAUMANNII COMPLEX   Final   CULTURE, BLOOD (ROUTINE X 2)     Status: Normal   Collection Time   06/21/12  7:03 AM      Component Value Range Status Comment   Specimen Description BLOOD LEFT ANTECUBITAL   Final    Special Requests BOTTLES DRAWN AEROBIC AND ANAEROBIC [facility]   Final    Culture  Setup Time 06/22/2012 15:41   Final    Culture     Final    Value: ACINETOBACTER CALCOACETICUS/BAUMANNII COMPLEX     Note: SUSCEPTIBILITIES PERFORMED ON PREVIOUS CULTURE WITHIN THE LAST 5 DAYS.     Note: CORRECTED RESULTS CALLED TO: CHILDRESS JESSICA AT [id number] ON 06/22/2012 BY BAUGHAM M. SMEAR REVIEWED 06/22/2012 BY [name] Schimke M.  GRAM STAIN REPORTED PREVIOUSLY AS  GRAM POSITIVE COCCI Gram Stain Report Called to,Read Back By and Verified With: WAGONER R.      AT 2150 ON 01/26/2014BY WOODIE J.   Report Status 06/25/2012 FINAL   Final   MRSA PCR SCREENING     Status: Abnormal   Collection Time   06/21/12 12:10 PM      Component Value Range Status Comment   MRSA by PCR POSITIVE (*) NEGATIVE Final   URINE CULTURE     Status: Normal   Collection Time   06/23/12  3:45 PM      Component Value Range Status Comment   Specimen Description URINE, SUPRAPUBIC   Final    Special Requests NONE   Final    Culture  Setup Time 06/24/2012 01:44   Final    Colony Count NO GROWTH   Final    Culture NO GROWTH   Final    Report Status 06/24/2012 FINAL   Final   CULTURE, BLOOD (ROUTINE X 2)     Status: Normal (Preliminary result)   Collection Time   06/26/12  4:20 PM      Component Value Range Status Comment    Specimen Description BLOOD LEFT HAND   Final    Special Requests BOTTLES DRAWN AEROBIC AND ANAEROBIC 10CC   Final    Culture  Setup Time 06/26/2012 23:24   Final    Culture     Final    Value:        BLOOD CULTURE RECEIVED NO GROWTH TO DATE CULTURE WILL BE HELD FOR 5 DAYS BEFORE ISSUING A FINAL NEGATIVE REPORT   Report Status PENDING   Incomplete   CLOSTRIDIUM DIFFICILE BY PCR     Status: Normal   Collection Time   06/29/12  1:52 PM      Component Value Range Status Comment   C difficile by pcr NEGATIVE  NEGATIVE Final    Assessment: She had transient Acinetobacter bacteremia that improved before starting tigecycline. There is some concern that she might have had pyelonephritis based on her CT scans I would complete a 10 day course of therapy.   Plan: 1.  agree with discharge on tigecycline for 5 more days  2.  please call if I can be of further assistance  Michel Bickers, MD [facility] for [place] [phone] pager   [phone] cell 06/30/2012, 4:25 PM

## 2012-07-02 LAB — CULTURE, BLOOD (ROUTINE X 2): Culture: NO GROWTH

## 2013-05-21 ENCOUNTER — Inpatient Hospital Stay (HOSPITAL_COMMUNITY)
Admission: EM | Admit: 2013-05-21 | Discharge: 2013-06-04 | DRG: 871 | Disposition: A | Discharge status: Home Health | Payer: Medicaid Other | Attending: Internal Medicine | Admitting: Internal Medicine

## 2013-05-21 ENCOUNTER — Inpatient Hospital Stay (HOSPITAL_COMMUNITY): Payer: Medicaid Other

## 2013-05-21 ENCOUNTER — Emergency Department (HOSPITAL_COMMUNITY): Payer: Medicaid Other

## 2013-05-21 ENCOUNTER — Encounter (HOSPITAL_COMMUNITY): Payer: Medicaid Other

## 2013-05-21 ENCOUNTER — Encounter (HOSPITAL_COMMUNITY): Payer: Self-pay | Admitting: Emergency Medicine

## 2013-05-21 DIAGNOSIS — J039 Acute tonsillitis, unspecified: Secondary | ICD-10-CM | POA: Diagnosis present

## 2013-05-21 DIAGNOSIS — L89509 Pressure ulcer of unspecified ankle, unspecified stage: Secondary | ICD-10-CM | POA: Diagnosis present

## 2013-05-21 DIAGNOSIS — D649 Anemia, unspecified: Secondary | ICD-10-CM | POA: Diagnosis present

## 2013-05-21 DIAGNOSIS — N10 Acute tubulo-interstitial nephritis: Secondary | ICD-10-CM | POA: Diagnosis present

## 2013-05-21 DIAGNOSIS — J690 Pneumonitis due to inhalation of food and vomit: Secondary | ICD-10-CM | POA: Diagnosis present

## 2013-05-21 DIAGNOSIS — J9600 Acute respiratory failure, unspecified whether with hypoxia or hypercapnia: Secondary | ICD-10-CM | POA: Diagnosis present

## 2013-05-21 DIAGNOSIS — G4700 Insomnia, unspecified: Secondary | ICD-10-CM | POA: Diagnosis present

## 2013-05-21 DIAGNOSIS — Z8673 Personal history of transient ischemic attack (TIA), and cerebral infarction without residual deficits: Secondary | ICD-10-CM

## 2013-05-21 DIAGNOSIS — E860 Dehydration: Secondary | ICD-10-CM | POA: Diagnosis present

## 2013-05-21 DIAGNOSIS — E876 Hypokalemia: Secondary | ICD-10-CM | POA: Diagnosis present

## 2013-05-21 DIAGNOSIS — Z87440 Personal history of urinary (tract) infections: Secondary | ICD-10-CM

## 2013-05-21 DIAGNOSIS — E872 Acidosis, unspecified: Secondary | ICD-10-CM | POA: Diagnosis present

## 2013-05-21 DIAGNOSIS — R410 Disorientation, unspecified: Secondary | ICD-10-CM | POA: Diagnosis present

## 2013-05-21 DIAGNOSIS — I4891 Unspecified atrial fibrillation: Secondary | ICD-10-CM | POA: Diagnosis present

## 2013-05-21 DIAGNOSIS — E875 Hyperkalemia: Secondary | ICD-10-CM | POA: Diagnosis present

## 2013-05-21 DIAGNOSIS — G9340 Encephalopathy, unspecified: Secondary | ICD-10-CM | POA: Diagnosis present

## 2013-05-21 DIAGNOSIS — IMO0002 Reserved for concepts with insufficient information to code with codable children: Secondary | ICD-10-CM

## 2013-05-21 DIAGNOSIS — A419 Sepsis, unspecified organism: Principal | ICD-10-CM | POA: Diagnosis present

## 2013-05-21 DIAGNOSIS — F411 Generalized anxiety disorder: Secondary | ICD-10-CM | POA: Diagnosis present

## 2013-05-21 DIAGNOSIS — H5316 Psychophysical visual disturbances: Secondary | ICD-10-CM | POA: Diagnosis present

## 2013-05-21 DIAGNOSIS — N189 Chronic kidney disease, unspecified: Secondary | ICD-10-CM | POA: Diagnosis present

## 2013-05-21 DIAGNOSIS — I129 Hypertensive chronic kidney disease with stage 1 through stage 4 chronic kidney disease, or unspecified chronic kidney disease: Secondary | ICD-10-CM | POA: Diagnosis present

## 2013-05-21 DIAGNOSIS — N179 Acute kidney failure, unspecified: Secondary | ICD-10-CM | POA: Diagnosis present

## 2013-05-21 DIAGNOSIS — G929 Unspecified toxic encephalopathy: Secondary | ICD-10-CM | POA: Diagnosis present

## 2013-05-21 DIAGNOSIS — L8994 Pressure ulcer of unspecified site, stage 4: Secondary | ICD-10-CM | POA: Diagnosis present

## 2013-05-21 DIAGNOSIS — G92 Toxic encephalopathy: Secondary | ICD-10-CM | POA: Diagnosis present

## 2013-05-21 DIAGNOSIS — T68XXXA Hypothermia, initial encounter: Secondary | ICD-10-CM

## 2013-05-21 DIAGNOSIS — G8220 Paraplegia, unspecified: Secondary | ICD-10-CM | POA: Diagnosis present

## 2013-05-21 LAB — COMPREHENSIVE METABOLIC PANEL
Albumin: 2.2 g/dL — ABNORMAL LOW (ref 3.5–5.2)
BUN: 22 mg/dL (ref 6–23)
Chloride: 100 mEq/L (ref 96–112)
Creatinine, Ser: 2.24 mg/dL — ABNORMAL HIGH (ref 0.50–1.10)
Total Bilirubin: 0.5 mg/dL (ref 0.3–1.2)

## 2013-05-21 LAB — BASIC METABOLIC PANEL
CO2: 18 mEq/L — ABNORMAL LOW (ref 19–32)
Calcium: 7.1 mg/dL — ABNORMAL LOW (ref 8.4–10.5)
Chloride: 105 mEq/L (ref 96–112)
Chloride: 105 mEq/L (ref 96–112)
Creatinine, Ser: 1.99 mg/dL — ABNORMAL HIGH (ref 0.50–1.10)
GFR calc Af Amer: 35 mL/min — ABNORMAL LOW (ref >90)
GFR calc Af Amer: 34 mL/min — ABNORMAL LOW (ref >90)
Potassium: 3.6 mEq/L (ref 3.5–5.1)
Potassium: 3.6 mEq/L (ref 3.5–5.1)
Sodium: 136 mEq/L (ref 135–145)
Sodium: 136 mEq/L (ref 135–145)

## 2013-05-21 LAB — RAPID URINE DRUG SCREEN, HOSP PERFORMED
Amphetamines: NOT DETECTED
Benzodiazepines: NOT DETECTED
Cocaine: NOT DETECTED
Opiates: NOT DETECTED
Tetrahydrocannabinol: NOT DETECTED

## 2013-05-21 LAB — CBC
Hemoglobin: 11.3 g/dL — ABNORMAL LOW (ref 12.0–15.0)
MCH: 30.2 pg (ref 26.0–34.0)
MCH: 29.3 pg (ref 26.0–34.0)
MCHC: 33.5 g/dL (ref 30.0–36.0)
MCV: 90.2 fL (ref 78.0–100.0)
Platelets: 223 10*3/uL (ref 150–400)
Platelets: 241 10*3/uL (ref 150–400)
RBC: 3.86 MIL/uL — ABNORMAL LOW (ref 3.87–5.11)
RDW: 14.1 % (ref 11.5–15.5)
WBC: 15.5 10*3/uL — ABNORMAL HIGH (ref 4.0–10.5)
WBC: 14.8 10*3/uL — ABNORMAL HIGH (ref 4.0–10.5)

## 2013-05-21 LAB — CBC WITH DIFFERENTIAL/PLATELET
Basophils Relative: 0 % (ref 0–1)
Eosinophils Absolute: 0.1 10*3/uL (ref 0.0–0.7)
HCT: 33.7 % — ABNORMAL LOW (ref 36.0–46.0)
Hemoglobin: 11.2 g/dL — ABNORMAL LOW (ref 12.0–15.0)
MCH: 30.7 pg (ref 26.0–34.0)
MCHC: 33.2 g/dL (ref 30.0–36.0)
MCV: 92.3 fL (ref 78.0–100.0)
Monocytes Absolute: 0.8 10*3/uL (ref 0.1–1.0)
Monocytes Relative: 5 % (ref 3–12)
Neutro Abs: 16 10*3/uL — ABNORMAL HIGH (ref 1.7–7.7)

## 2013-05-21 LAB — URINALYSIS, ROUTINE W REFLEX MICROSCOPIC
Bilirubin Urine: NEGATIVE
Ketones, ur: NEGATIVE mg/dL
Nitrite: POSITIVE — AB
Protein, ur: 30 mg/dL — AB
Specific Gravity, Urine: 1.015 (ref 1.005–1.030)
Urobilinogen, UA: 0.2 mg/dL (ref 0.0–1.0)

## 2013-05-21 LAB — URINE MICROSCOPIC-ADD ON

## 2013-05-21 LAB — MRSA PCR SCREENING: MRSA by PCR: POSITIVE — AB

## 2013-05-21 LAB — PRO B NATRIURETIC PEPTIDE: Pro B Natriuretic peptide (BNP): 1928 pg/mL — ABNORMAL HIGH (ref 0–125)

## 2013-05-21 LAB — PROCALCITONIN: Procalcitonin: 5.74 ng/mL

## 2013-05-21 LAB — BLOOD GAS, ARTERIAL
Acid-Base Excess: 10.1 mmol/L — ABNORMAL HIGH (ref 0.0–2.0)
MECHVT: 500 mL
Patient temperature: 86.9
pCO2 arterial: 40.8 mmHg (ref 35.0–45.0)
pH, Arterial: 7.221 — ABNORMAL LOW (ref 7.350–7.450)

## 2013-05-21 LAB — LACTIC ACID, PLASMA: Lactic Acid, Venous: 1.5 mmol/L (ref 0.5–2.2)

## 2013-05-21 LAB — AMMONIA: Ammonia: 28 umol/L (ref 11–60)

## 2013-05-21 LAB — LIPASE, BLOOD: Lipase: 16 U/L (ref 11–59)

## 2013-05-21 LAB — CK: Total CK: 185 U/L — ABNORMAL HIGH (ref 7–177)

## 2013-05-21 LAB — TROPONIN I: Troponin I: <0.3 ng/mL (ref <0.30)

## 2013-05-21 MED ORDER — SUCCINYLCHOLINE CHLORIDE 20 MG/ML IJ SOLN
INTRAMUSCULAR | Status: AC
  Administered 2013-05-21: 150 mg
  Filled 2013-05-21: qty 1

## 2013-05-21 MED ORDER — FENTANYL CITRATE 0.05 MG/ML IJ SOLN
100.0000 ug | INTRAMUSCULAR | Status: DC | PRN
  Administered 2013-05-21 – 2013-05-24 (×6): 100 ug via INTRAVENOUS
  Filled 2013-05-21 (×6): qty 2

## 2013-05-21 MED ORDER — LIDOCAINE HCL (CARDIAC) 20 MG/ML IV SOLN
INTRAVENOUS | Status: AC
  Filled 2013-05-21: qty 5

## 2013-05-21 MED ORDER — DOPAMINE-DEXTROSE 3.2-5 MG/ML-% IV SOLN
10.0000 ug/kg/min | INTRAVENOUS | Status: DC
  Administered 2013-05-21: 10 ug/kg/min via INTRAVENOUS
  Filled 2013-05-21: qty 250

## 2013-05-21 MED ORDER — ETOMIDATE 2 MG/ML IV SOLN
INTRAVENOUS | Status: AC
  Administered 2013-05-21: 30 mg
  Filled 2013-05-21: qty 20

## 2013-05-21 MED ORDER — ENOXAPARIN SODIUM 40 MG/0.4ML ~~LOC~~ SOLN
40.0000 mg | SUBCUTANEOUS | Status: DC
  Administered 2013-05-21 – 2013-05-23 (×3): 40 mg via SUBCUTANEOUS
  Filled 2013-05-21 (×3): qty 0.4

## 2013-05-21 MED ORDER — FENTANYL CITRATE 0.05 MG/ML IJ SOLN
100.0000 ug | INTRAMUSCULAR | Status: DC | PRN
  Administered 2013-05-21: 100 ug via INTRAVENOUS
  Filled 2013-05-21: qty 2

## 2013-05-21 MED ORDER — PROPOFOL 10 MG/ML IV BOLUS
20.0000 mg | Freq: Once | INTRAVENOUS | Status: AC
  Administered 2013-05-21: 20 mg via INTRAVENOUS

## 2013-05-21 MED ORDER — MIDAZOLAM HCL 2 MG/2ML IJ SOLN
2.0000 mg | Freq: Once | INTRAMUSCULAR | Status: AC
  Administered 2013-05-21: 2 mg via INTRAVENOUS

## 2013-05-21 MED ORDER — PANTOPRAZOLE SODIUM 40 MG IV SOLR
40.0000 mg | Freq: Every day | INTRAVENOUS | Status: DC
  Administered 2013-05-21 – 2013-06-03 (×13): 40 mg via INTRAVENOUS
  Filled 2013-05-21 (×13): qty 40

## 2013-05-21 MED ORDER — CHLORHEXIDINE GLUCONATE 0.12 % MT SOLN
15.0000 mL | Freq: Two times a day (BID) | OROMUCOSAL | Status: DC
  Administered 2013-05-21 – 2013-05-26 (×10): 15 mL via OROMUCOSAL
  Filled 2013-05-21 (×10): qty 15

## 2013-05-21 MED ORDER — MIDAZOLAM HCL 2 MG/2ML IJ SOLN
INTRAMUSCULAR | Status: AC
Start: 2013-05-21 — End: 2013-05-21
  Administered 2013-05-21: 2 mg
  Filled 2013-05-21: qty 2

## 2013-05-21 MED ORDER — ASPIRIN 81 MG PO CHEW
324.0000 mg | CHEWABLE_TABLET | ORAL | Status: AC
  Administered 2013-05-21: 324 mg via ORAL
  Filled 2013-05-21: qty 4

## 2013-05-21 MED ORDER — MIDAZOLAM HCL 5 MG/5ML IJ SOLN
INTRAMUSCULAR | Status: AC
  Administered 2013-05-21: 2 mg via INTRAVENOUS
  Filled 2013-05-21: qty 5

## 2013-05-21 MED ORDER — SODIUM CHLORIDE 0.9 % IV SOLN
250.0000 mL | INTRAVENOUS | Status: DC | PRN
  Administered 2013-05-27: 250 mL via INTRAVENOUS

## 2013-05-21 MED ORDER — SODIUM CHLORIDE 0.9 % IV BOLUS (SEPSIS)
1000.0000 mL | Freq: Once | INTRAVENOUS | Status: AC
  Administered 2013-05-21: 1000 mL via INTRAVENOUS

## 2013-05-21 MED ORDER — PROPOFOL 10 MG/ML IV EMUL
5.0000 ug/kg/min | Freq: Once | INTRAVENOUS | Status: AC
  Administered 2013-05-21: 5 ug/kg/min via INTRAVENOUS

## 2013-05-21 MED ORDER — FENTANYL CITRATE 0.05 MG/ML IJ SOLN
INTRAMUSCULAR | Status: AC
  Administered 2013-05-21: 50 ug via INTRAVENOUS
  Filled 2013-05-21: qty 2

## 2013-05-21 MED ORDER — IPRATROPIUM BROMIDE 0.02 % IN SOLN
0.5000 mg | Freq: Four times a day (QID) | RESPIRATORY_TRACT | Status: DC
  Administered 2013-05-21 – 2013-05-24 (×12): 0.5 mg via RESPIRATORY_TRACT
  Filled 2013-05-21 (×12): qty 2.5

## 2013-05-21 MED ORDER — SODIUM CHLORIDE 0.9 % IJ SOLN
10.0000 mL | Freq: Two times a day (BID) | INTRAMUSCULAR | Status: DC
  Administered 2013-05-23 – 2013-05-27 (×9): 10 mL
  Administered 2013-05-28 – 2013-05-29 (×2): 20 mL
  Administered 2013-05-29 – 2013-05-30 (×2): 10 mL
  Administered 2013-05-30: 20 mL
  Administered 2013-05-31: 10 mL
  Administered 2013-05-31: 20 mL
  Administered 2013-06-01 – 2013-06-04 (×4): 10 mL

## 2013-05-21 MED ORDER — FENTANYL CITRATE 0.05 MG/ML IJ SOLN
50.0000 ug | INTRAMUSCULAR | Status: DC | PRN
  Administered 2013-05-21 (×3): 50 ug via INTRAVENOUS
  Filled 2013-05-21 (×2): qty 2

## 2013-05-21 MED ORDER — MIDAZOLAM HCL 2 MG/2ML IJ SOLN
INTRAMUSCULAR | Status: AC
  Filled 2013-05-21: qty 2

## 2013-05-21 MED ORDER — CHLORHEXIDINE GLUCONATE CLOTH 2 % EX PADS
6.0000 | MEDICATED_PAD | Freq: Every day | CUTANEOUS | Status: AC
  Administered 2013-05-22 – 2013-05-26 (×4): 6 via TOPICAL

## 2013-05-21 MED ORDER — VANCOMYCIN HCL IN DEXTROSE 1-5 GM/200ML-% IV SOLN
1000.0000 mg | INTRAVENOUS | Status: DC
  Administered 2013-05-21 – 2013-05-23 (×3): 1000 mg via INTRAVENOUS
  Filled 2013-05-21 (×4): qty 200

## 2013-05-21 MED ORDER — BIOTENE DRY MOUTH MT LIQD
15.0000 mL | Freq: Four times a day (QID) | OROMUCOSAL | Status: DC
  Administered 2013-05-21 – 2013-06-04 (×48): 15 mL via OROMUCOSAL

## 2013-05-21 MED ORDER — MUPIROCIN 2 % EX OINT
1.0000 "application " | TOPICAL_OINTMENT | Freq: Two times a day (BID) | CUTANEOUS | Status: AC
  Administered 2013-05-21 – 2013-05-26 (×10): 1 via NASAL
  Filled 2013-05-21 (×2): qty 22

## 2013-05-21 MED ORDER — ASPIRIN 300 MG RE SUPP
300.0000 mg | RECTAL | Status: AC
  Filled 2013-05-21: qty 1

## 2013-05-21 MED ORDER — SODIUM CHLORIDE 0.9 % IV SOLN
INTRAVENOUS | Status: DC
  Administered 2013-05-22: 08:00:00 via INTRAVENOUS

## 2013-05-21 MED ORDER — ROCURONIUM BROMIDE 50 MG/5ML IV SOLN
INTRAVENOUS | Status: AC
  Filled 2013-05-21: qty 2

## 2013-05-21 MED ORDER — DOPAMINE-DEXTROSE 3.2-5 MG/ML-% IV SOLN
INTRAVENOUS | Status: AC
  Administered 2013-05-21: 10 ug/kg/min via INTRAVENOUS
  Filled 2013-05-21: qty 250

## 2013-05-21 MED ORDER — MIDAZOLAM HCL 2 MG/2ML IJ SOLN
INTRAMUSCULAR | Status: AC
  Administered 2013-05-21: 2 mg via INTRAVENOUS
  Filled 2013-05-21: qty 2

## 2013-05-21 MED ORDER — PIPERACILLIN-TAZOBACTAM 3.375 G IVPB
3.3750 g | Freq: Three times a day (TID) | INTRAVENOUS | Status: DC
  Administered 2013-05-21 – 2013-05-27 (×18): 3.375 g via INTRAVENOUS
  Filled 2013-05-21 (×21): qty 50

## 2013-05-21 MED ORDER — ALBUTEROL SULFATE (5 MG/ML) 0.5% IN NEBU
2.5000 mg | INHALATION_SOLUTION | Freq: Four times a day (QID) | RESPIRATORY_TRACT | Status: DC
  Administered 2013-05-21 – 2013-05-24 (×12): 2.5 mg via RESPIRATORY_TRACT
  Filled 2013-05-21 (×10): qty 0.5
  Filled 2013-05-21: qty 3
  Filled 2013-05-21: qty 0.5

## 2013-05-21 MED ORDER — MIDAZOLAM HCL 2 MG/2ML IJ SOLN
2.0000 mg | INTRAMUSCULAR | Status: DC | PRN
  Administered 2013-05-21 (×4): 2 mg via INTRAVENOUS
  Filled 2013-05-21 (×3): qty 2

## 2013-05-21 MED ORDER — SODIUM CHLORIDE 0.9 % IJ SOLN
10.0000 mL | INTRAMUSCULAR | Status: DC | PRN
  Administered 2013-06-04: 10 mL

## 2013-05-21 MED ORDER — PROPOFOL 10 MG/ML IV EMUL
INTRAVENOUS | Status: AC
  Administered 2013-05-21: 5 ug/kg/min via INTRAVENOUS
  Filled 2013-05-21: qty 100

## 2013-05-21 NOTE — Procedures (Signed)
Central Venous Catheter Insertion Procedure Note [name] [id number] 02-11-1966  Procedure: Insertion of Central Venous Catheter Indications: Assessment of intravascular volume, Drug and/or fluid administration and Frequent blood sampling  Procedure Details Consent: Risks of procedure as well as the alternatives and risks of each were explained to the (patient/caregiver).  Consent for procedure obtained. Time Out: Verified patient identification, verified procedure, site/side was marked, verified correct patient position, special equipment/implants available, medications/allergies/relevent history reviewed, required imaging and test results available.  Performed  Maximum sterile technique was used including antiseptics, cap, gloves, gown, hand hygiene, mask and sheet. Skin prep: Chlorhexidine; local anesthetic administered A antimicrobial bonded/coated triple lumen catheter was placed in the right subclavian vein using the Seldinger technique.  Evaluation Blood flow good Complications: No apparent complications Patient did tolerate procedure well. Chest X-ray ordered to verify placement.  CXR: normal.  [name] A 05/21/2013, 6:52 PM

## 2013-05-21 NOTE — Progress Notes (Signed)
Blood culture aerobic bottle came back with gram negative rods.  MD made aware via text page. Patient already on vanc and zosyn

## 2013-05-21 NOTE — ED Notes (Signed)
CRITICAL VALUE ALERT  Critical value received:  Ph 7.221, PCO2 40.8, PO2 292, Bicarb 16.1, sats 99%  Date of notification: 05/21/13  Time of notification:  1405  Critical value read back:yes  Nurse who received alert:  Felicity Coyer,, RN  MD notified (1st page):    Time of first page:    MD notified (2nd page):  Time of second page:  Responding MD:  Dr. Joseph Berkshire  Time MD responded:  [phone]

## 2013-05-21 NOTE — ED Notes (Signed)
One gold colored ring removed and one silver colored ring removed and given to daughter, Misty Stanley.  2nd gold colored ring had to be cut with ring cutter due to swelling and given to daughter.

## 2013-05-21 NOTE — ED Provider Notes (Signed)
CSN: [id number]     Arrival date & time 05/21/13  1101 History  This chart was scribed for Orpah Greek, MD, by Neta Ehlers, ED Scribe. This patient was seen in room APA02/APA02 and the patient's care was started at 11:01 AM.  None    Chief Complaint  Patient presents with  . unresponsive    The history is provided by the EMS personnel and medical records. The history is limited by the condition of the patient. No language interpreter was used.   Level Five CAVEAT: Unresponsiveness   HPI Comments: [name] is a 47 y.o. female, with a h/o paraplegia, who was brought in by EMS to the ED presenting with unresponsiveness.  Per EMS, she was found by her caregiver PTA lying unresponsive in her bed and the heat in her house was off. EMS recorded her BP as 112/55.  She was last visited by her caregiver two days ago, and the caregiver reported the pt was altered.   Past Medical History  Diagnosis Date  . Paraplegia (lower)    Past Surgical History  Procedure Laterality Date  . Back surgery     No family history on file. History  Substance Use Topics  . Smoking status: Never Smoker   . Smokeless tobacco: Not on file  . Alcohol Use: No   No OB history provided.  Review of Systems  Unable to perform ROS: Patient unresponsive    Allergies  Review of patient's allergies indicates no known allergies.  Home Medications   Current Outpatient Rx  Name  Route  Sig  Dispense  Refill  . baclofen (LIORESAL) 20 MG tablet   Oral   Take 40 mg by mouth 4 (four) times daily.          . dantrolene (DANTRIUM) 25 MG capsule   Oral   Take 25 mg by mouth daily.          . diazepam (VALIUM) 5 MG tablet   Oral   Take 5 mg by mouth every 12 (twelve) hours as needed. For anxiety         . fesoterodine (TOVIAZ) 4 MG TB24   Oral   Take 1 tablet (4 mg total) by mouth daily.   30 tablet   0   . gabapentin (NEURONTIN) 300 MG capsule   Oral   Take 300 mg by mouth 3  (three) times daily.           Marland Kitchen HYDROcodone-acetaminophen (NORCO/VICODIN) 5-325 MG per tablet   Oral   Take 1 tablet by mouth Every 4 hours as needed. Pain.         . lidocaine (LIDODERM) 5 %   Transdermal   Place 1 patch onto the skin daily. Remove & Discard patch within 12 hours or as directed by MD         . Multiple Vitamin (MULTIVITAMIN) tablet   Oral   Take 1 tablet by mouth daily.         . naproxen (NAPROSYN) 500 MG tablet   Oral   Take 500 mg by mouth 2 (two) times daily with a meal.           . senna-docusate (SENOKOT-S) 8.6-50 MG per tablet   Oral   Take 1 tablet by mouth at bedtime.         . sodium chloride 0.9 % SOLN 100 mL with tigecycline 50 MG SOLR 50 mg   Intravenous   Inject 50 mg into  the vein every 12 (twelve) hours.   600 mg   0    Triage Vitals: BP 129/88  Pulse 87  SpO2 100%  Physical Exam  Constitutional: She appears well-developed and well-nourished. No distress.  HENT:  Head: Normocephalic and atraumatic.  Right Ear: Hearing normal.  Left Ear: Hearing normal.  Nose: Nose normal.  Mouth/Throat: Oropharynx is clear and moist and mucous membranes are normal.  Eyes: Conjunctivae and EOM are normal.  Neck: Normal range of motion. Neck supple.  Cardiovascular: Regular rhythm, S1 normal and S2 normal.  Exam reveals no gallop and no friction rub.   No murmur heard. Tachycardic.   Pulmonary/Chest: She exhibits no tenderness.  Coarse breath sounds.  Bradypnic.  Snoring.   Abdominal: Soft. Normal appearance. There is no hepatosplenomegaly. There is no tenderness at McBurney's point and negative Murphy's sign. No hernia.  Musculoskeletal: She exhibits edema.  Neurological: She has normal strength. No sensory deficit. GCS eye subscore is 4. GCS verbal subscore is 5. GCS motor subscore is 6.  Unresponsive.   Skin: Skin is dry and intact. No rash noted. No cyanosis.  Cold to the touch.   Psychiatric: Her speech is normal.    ED  Course  INTUBATION Date/Time: 05/21/2013 3:19 PM Performed by: Orpah Greek. Authorized by: Orpah Greek Consent: The procedure was performed in an emergent situation. Patient identity confirmed: arm band Indications: respiratory failure, airway protection, hypoxemia and hypercapnia Intubation method: direct Patient status: paralyzed (RSI) Preoxygenation: nonrebreather mask Sedatives: etomidate Paralytic: succinylcholine Laryngoscope size: Mac 4 Tube size: 8.0 mm Tube type: cuffed Number of attempts: 1 Cricoid pressure: no Cords visualized: yes Post-procedure assessment: chest rise and CO2 detector Breath sounds: equal Cuff inflated: yes ETT to lip: 20 cm Tube secured with: ETT holder Chest x-ray interpreted by radiologist. Chest x-ray findings: endotracheal tube too low Tube repositioned: tube repositioned successfully Patient tolerance: Patient tolerated the procedure well with no immediate complications.   (including critical care time)  COORDINATION OF CARE:  11:02 AM- Began care.   11:16 AM- Intubation.   11:46 AM- Rechecked pt.   12:19 PM- Rechecked pt.   12:43 PM- Rechecked pt.   Labs Review Labs Reviewed  CBC WITH DIFFERENTIAL - Abnormal; Notable for the following:    WBC 18.3 (*)    RBC 3.65 (*)    Hemoglobin 11.2 (*)    HCT 33.7 (*)    Neutrophils Relative % 88 (*)    Neutro Abs 16.0 (*)    Lymphocytes Relative 7 (*)    All other components within normal limits  COMPREHENSIVE METABOLIC PANEL - Abnormal; Notable for the following:    Sodium 133 (*)    Potassium 5.8 (*)    Glucose, Bld 148 (*)    Creatinine, Ser 2.24 (*)    Calcium 7.9 (*)    Albumin 2.2 (*)    Alkaline Phosphatase 151 (*)    GFR calc non Af Amer 25 (*)    GFR calc Af Amer 29 (*)    All other components within normal limits  PRO B NATRIURETIC PEPTIDE - Abnormal; Notable for the following:    Pro B Natriuretic peptide (BNP) 1928.0 (*)    All other  components within normal limits  URINALYSIS, ROUTINE W REFLEX MICROSCOPIC - Abnormal; Notable for the following:    APPearance CLOUDY (*)    Hgb urine dipstick LARGE (*)    Protein, ur 30 (*)    Nitrite POSITIVE (*)    Leukocytes,  UA LARGE (*)    All other components within normal limits  LACTIC ACID, PLASMA - Abnormal; Notable for the following:    Lactic Acid, Venous 3.7 (*)    All other components within normal limits  AMMONIA - Abnormal; Notable for the following:    Ammonia 72 (*)    All other components within normal limits  PROTIME-INR - Abnormal; Notable for the following:    Prothrombin Time 16.2 (*)    All other components within normal limits  BLOOD GAS, ARTERIAL - Abnormal; Notable for the following:    pH, Arterial 7.221 (*)    pO2, Arterial 292.0 (*)    Bicarbonate 16.1 (*)    Acid-Base Excess 10.1 (*)    Allens test (pass/fail) NOT INDICATED (*)    All other components within normal limits  URINE MICROSCOPIC-ADD ON - Abnormal; Notable for the following:    Squamous Epithelial / LPF FEW (*)    Bacteria, UA MANY (*)    All other components within normal limits  CULTURE, BLOOD (ROUTINE X 2)  CULTURE, BLOOD (ROUTINE X 2)  URINE CULTURE  TROPONIN I  PREGNANCY, URINE  ACETAMINOPHEN LEVEL  ETHANOL  URINE RAPID DRUG SCREEN (HOSP PERFORMED)  TSH   Imaging Review Ct Head Wo Contrast  05/21/2013   CLINICAL DATA:  Unresponsive.  EXAM: CT HEAD WITHOUT CONTRAST  TECHNIQUE: Contiguous axial images were obtained from the base of the skull through the vertex without intravenous contrast.  COMPARISON:  None.  FINDINGS: No mass. No hydrocephalus. No hemorrhage. Lucency noted in the left frontal white matter and cortex most likely from prior stroke. To exclude a significant underlying lesion such as a tumor MRI the brain is suggested. No acute bony abnormality identified. Air-fluid level in the left maxillary and sphenoid sinus. Sinusitis could present in this fashion. NG tube is  noted. Soft tissue swelling in the nasopharynx.  IMPRESSION: 1. Left frontal white matter and cortical lucency most likely from prior stroke, to exclude underlying lesion including tumor MRI the brain can be obtained. 2. Air-fluid level left maxillary and sphenoid sinus. These findings would be consistent with sinusitis. NG tube is noted. Poor aeration of the nasopharynx with soft tissue swelling is noted.   Electronically Signed   By: Marcello Moores  Register   On: 05/21/2013 13:19   Dg Chest Portable 1 View  05/21/2013   CLINICAL DATA:  Post intubation  EXAM: PORTABLE CHEST - 1 VIEW  COMPARISON:  Portable exam 1201 hr compared to 06/25/2012  FINDINGS: Tip of endotracheal tube at or very near the carina and right mainstem bronchus origin, carina poorly localized.  Nasogastric tube extends into abdomen.  Orthopedic hardware in the thoracic spine limits assessment of the endotracheal tube and carina.  Numerous EKG leads project over chest.  Enlargement of cardiac silhouette with pulmonary vascular congestion.  Question mild basilar atelectasis versus infiltrate.  Upper lungs clear.  No gross pneumothorax.  Old mid right clavicular fracture.  IMPRESSION: Chronic is poorly localized but the tip of the endotracheal tube is likely at or very near the carina and origin of the right mainstem bronchus; consider withdrawal 1-2 cm.  Enlargement of cardiac silhouette with pulmonary vascular congestion.  Probable mild bibasilar atelectasis versus infiltrate.  Findings called to Dr. Betsey Holiday on 05/21/2013 at 1218 hr.   Electronically Signed   By: Lavonia Dana M.D.   On: 05/21/2013 12:18    EKG Interpretation   None       MDM  Diagnosis:  1. Acute respiratory failure 2. mental status changes 3. Hypothermia  Patient presents to the ER for evaluation by EMS. Patient's visiting nurse found her in bed, unresponsive this morning. EMS reports that she has had respiratory distress, decreased respiratory rate and has been  unresponsive except to deep painful stimuli since her arrival. They were unable to establish an IV, established an IV line and gave her Narcan without any improvement. Patient brought to the ER with nonrebreather face mask in place.  At arrival, patient is in severe distress. She is unresponsive and exhibiting snoring respirations with a respiratory rate of 6-8. It was felt that the patient required intubation. This was performed without difficulty.  The patient was normotensive on arrival. Core temperature was 86. EMS reports that there was no heat in the home. Patient initiated on [facility] and warm fluids.  She started to become agitated after intubation and required sedation. She was given a dose of Versed. After the first day, nurses noted that she had increased swelling of the face. There is no outward sign of allergic reaction, no rash. Because of the uncertainty of this was a reaction to Versed, patient was switched to [place]. She unfortunately developed hypotension with this. This was stopped and she had a prolonged period of hypotension. She was initiated on dopamine after approximately 20 minutes and had improvement in her low pressure, but heart rate became elevated and irregular. Appears that she is now in atrial fibrillation with rapid ventricular response of 100-110. She has persistently been now hypotensive the family, this can be stopped.  Workup was unrevealing other than evidence of urinary tract infection. She might have early urosepsis. Based on her respiratory status at arrival, aspiration is also a possibility. Patient initiated on Zosyn and vancomycin.  Patient will be admitted to the hospitalist service in the ICU.  I personally performed the services described in this documentation, which was scribed in my presence. The recorded information has been reviewed and is accurate.  CRITICAL CARE Performed by: Orpah Greek   Total critical care time:  77min  Critical care time was exclusive of separately billable procedures and treating other patients.  Critical care was necessary to treat or prevent imminent or life-threatening deterioration.  Critical care was time spent personally by me on the following activities: development of treatment plan with patient and/or surrogate as well as nursing, discussions with consultants, evaluation of patient's response to treatment, examination of patient, obtaining history from patient or surrogate, ordering and performing treatments and interventions, ordering and review of laboratory studies, ordering and review of radiographic studies, pulse oximetry and re-evaluation of patient's condition.    Orpah Greek, MD 05/21/13 [phone]

## 2013-05-21 NOTE — ED Notes (Signed)
Pt had episode of VT on monitor for approx 3-4 sec, now a-fib on monitor with RVR.  edp notified and at bedside.  Ordered to decreased dopamine and monitor.

## 2013-05-21 NOTE — ED Notes (Signed)
Upon arrival, pt skin cool to touch.  EMS reports pt did not have heat on in house.  Body posturing with no response to pain.

## 2013-05-21 NOTE — ED Notes (Signed)
Pt opened eyes, reaching for ET tube.  Family at bedside holding pt's hand.  Sedation meds given.
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
## 2013-05-21 NOTE — ED Notes (Signed)
EKG was done at 1300

## 2013-05-21 NOTE — ED Notes (Signed)
Pt has supra pubic cath in place with foul smelling odor and yellow drainage on dressing.  Pt also having vaginal bleeding.

## 2013-05-21 NOTE — H&P (Signed)
[facility] History and Physical  [name] [id number] DOB: Oct 08, 1965 DOA: 05/21/2013  Referring physician: Dr. Betsey Holiday, ED physician PCP: [name], MD   Chief Complaint: unresponsive  HPI: [name] is a 47 y.o. female who has a history of paraplegia, lives independently and has an aide that frequently checks on her. The patient was brought to the hospital unresponsive and is now intubated and sedated. She is unable to provide any history. Unfortunately there is no family present, and cannot be contacted via phone numbers listed in the chart. History is limited and obtained from the medical record. Patient was apparently brought in unresponsive earlier today and cold to touch. Heating in her house have been turned off. Per reports, her family has spoken to her yesterday and noted some slowing/slurring of her speech. On arrival to the emergency room, she was noted to have slow/snoring respirations. She was unresponsive, tachycardic. The patient was intubated for respiratory depression/airway protection. She was also noted to be hypotensive requiring dopamine administration. The hospitalist service was asked to admit the patient for further management   Review of Systems: Unable to assess due to mental status   Past Medical History  Diagnosis Date  . Paraplegia (lower)    Past Surgical History  Procedure Laterality Date  . Back surgery     Social History:  reports that she has never smoked. She does not have any smokeless tobacco history on file. She reports that she does not drink alcohol or use illicit drugs.  Allergies  Allergen Reactions  . Quinine Derivatives Other (See Comments)    Alters mental status    Family History: unable to assess due to mental status   Prior to Admission medications   Medication Sig Start Date End Date Taking? Authorizing Provider  baclofen (LIORESAL) 20 MG tablet Take 40 mg by mouth 4 (four) times daily.    Yes Historical  Provider, MD  dantrolene (DANTRIUM) 25 MG capsule Take 25 mg by mouth daily.    Yes Historical Provider, MD  diazepam (VALIUM) 5 MG tablet Take 5 mg by mouth every 12 (twelve) hours as needed. For anxiety   Yes Historical Provider, MD  docusate sodium (COLACE) 100 MG capsule Take 100 mg by mouth at bedtime.   Yes Historical Provider, MD  fesoterodine (TOVIAZ) 4 MG TB24 Take 1 tablet (4 mg total) by mouth daily. 06/30/12  Yes [name], MD  gabapentin (NEURONTIN) 300 MG capsule Take 300 mg by mouth 2 (two) times daily.    Yes Historical Provider, MD  HYDROcodone-acetaminophen (NORCO/VICODIN) 5-325 MG per tablet Take 1 tablet by mouth Every 4 hours as needed. Pain. 06/05/12  Yes Historical Provider, MD  lidocaine (LIDODERM) 5 % Place 1 patch onto the skin daily. Remove & Discard patch within 12 hours or as directed by MD   Yes Historical Provider, MD  Multiple Vitamin (MULTIVITAMIN) tablet Take 1 tablet by mouth daily.   Yes Historical Provider, MD  naproxen (NAPROSYN) 500 MG tablet Take 500 mg by mouth 2 (two) times daily with a meal.     Yes Historical Provider, MD   Physical Exam: Filed Vitals:   05/21/13 1730  BP: 138/80  Pulse: 78  Temp:   Resp: 17    BP 138/80  Pulse 78  Temp(Src) 92 F (33.3 C) (Rectal)  Resp 17  Wt 104.327 kg (230 lb)  SpO2 99%  General:  Intubated and sedated on ventilator Eyes: PERRL, eye lids and face appear edematous ENT: grossly normal  hearing, lips & tongue Neck: no LAD, masses or thyromegaly Cardiovascular: RRR, no m/r/g. trace LE edema. Respiratory: coarse crackles bilaterally. Abdomen: soft, distended, non tender, hypoactive bowel sounds Skin: 2cmx2cm stage 4 pressure ulcer on left ankle Musculoskeletal: grossly normal tone BUE/BLE Psychiatric: unable to assess due to sedation Neurologic: unable to assess due to sedation          Labs on Admission:  Basic Metabolic Panel:  Recent Labs Lab 05/21/13 1145  NA 133*  K 5.8*  CL 100   CO2 21  GLUCOSE 148*  BUN 22  CREATININE 2.24*  CALCIUM 7.9*   Liver Function Tests:  Recent Labs Lab 05/21/13 1145  AST 28  ALT 18  ALKPHOS 151*  BILITOT 0.5  PROT 6.6  ALBUMIN 2.2*   No results found for this basename: LIPASE, AMYLASE,  in the last 168 hours  Recent Labs Lab 05/21/13 1146  AMMONIA 72*   CBC:  Recent Labs Lab 05/21/13 1145  WBC 18.3*  NEUTROABS 16.0*  HGB 11.2*  HCT 33.7*  MCV 92.3  PLT 186   Cardiac Enzymes:  Recent Labs Lab 05/21/13 1145  TROPONINI <0.30    BNP (last 3 results)  Recent Labs  05/21/13 1145  PROBNP 1928.0*   CBG: No results found for this basename: GLUCAP,  in the last 168 hours  Radiological Exams on Admission: Ct Head Wo Contrast  05/21/2013   CLINICAL DATA:  Unresponsive.  EXAM: CT HEAD WITHOUT CONTRAST  TECHNIQUE: Contiguous axial images were obtained from the base of the skull through the vertex without intravenous contrast.  COMPARISON:  None.  FINDINGS: No mass. No hydrocephalus. No hemorrhage. Lucency noted in the left frontal white matter and cortex most likely from prior stroke. To exclude a significant underlying lesion such as a tumor MRI the brain is suggested. No acute bony abnormality identified. Air-fluid level in the left maxillary and sphenoid sinus. Sinusitis could present in this fashion. NG tube is noted. Soft tissue swelling in the nasopharynx.  IMPRESSION: 1. Left frontal white matter and cortical lucency most likely from prior stroke, to exclude underlying lesion including tumor MRI the brain can be obtained. 2. Air-fluid level left maxillary and sphenoid sinus. These findings would be consistent with sinusitis. NG tube is noted. Poor aeration of the nasopharynx with soft tissue swelling is noted.   Electronically Signed   By: Marcello Moores  Register   On: 05/21/2013 13:19   Dg Chest Portable 1 View  05/21/2013   CLINICAL DATA:  Post intubation  EXAM: PORTABLE CHEST - 1 VIEW  COMPARISON:  Portable exam  1201 hr compared to 06/25/2012  FINDINGS: Tip of endotracheal tube at or very near the carina and right mainstem bronchus origin, carina poorly localized.  Nasogastric tube extends into abdomen.  Orthopedic hardware in the thoracic spine limits assessment of the endotracheal tube and carina.  Numerous EKG leads project over chest.  Enlargement of cardiac silhouette with pulmonary vascular congestion.  Question mild basilar atelectasis versus infiltrate.  Upper lungs clear.  No gross pneumothorax.  Old mid right clavicular fracture.  IMPRESSION: Chronic is poorly localized but the tip of the endotracheal tube is likely at or very near the carina and origin of the right mainstem bronchus; consider withdrawal 1-2 cm.  Enlargement of cardiac silhouette with pulmonary vascular congestion.  Probable mild bibasilar atelectasis versus infiltrate.  Findings called to Dr. Betsey Holiday on 05/21/2013 at 1218 hr.   Electronically Signed   By: Crist Infante.D.  On: 05/21/2013 12:18    EKG: atrial fibrillation with RVR  Assessment/Plan Active Problems:   Paraplegia   Pyelonephritis, acute   Respiratory failure, acute   Sepsis   Encephalopathy acute   Acute renal failure   Hyperkalemia   Hypothermia   1. Sepsis. At this time, source appears to be the urine. The patient was started on broad-spectrum antibiotics, she has a history of antibiotic resistant infections. Cultures have been sent. She'll be continued on vasopressors as well as IV fluids. It is difficult to assess her volume status, since she does appear septic, but at the same time she does appear edematous as well. Once we obtain central venous access, we will check central venous pressures  which will further help guide fluid management. Check pro calcitonin, lactic acid, cortisol. Wean off dopamine as tolerated. 2. urinary tract infection. Patient has a suprapubic catheter due to her history of paraplegia. She's been started on transfer to antibiotics and  will followup cultures. 3. Acute renal failure. Possibly due to sepsis and dehydration. Start the patient on IV fluids. Check serum CK levels to evaluate for rhabdomyolysis. May need further imaging such as renal ultrasound if she does not improve. 4. Acute respiratory failure. Patient requires intubation and mechanical ventilation. We will consult pulmonology for assistance with management. Ventilator sedation as per protocol. 5. Hyperkalemia. Likely related to renal failure. She has received IV fluids. We will check repeat basic metabolic panel and Kayexalate if needed. 6. Hypothermia. Likely related to prolonged immobilization without any heat in her home. Warming blankets will be applied. 7. Acute encephalopathy. Likely related to sepsis. Will have to reevaluate once she has been extubated. 8. Abnormal CT head. It was noted that she likely had a remote stroke on her CT. Underlying lesion including tumor could not be ruled out. She will need an MRI of the brain to further evaluate this when she is further stabilized. 9. Elevated ammonia level. Ammonia level is mildly elevated at 72. Unclear significance. We will continue to monitor. 10. Distended abdomen. Patient has distended abdomen with hypoactive bowel sounds. She is having some output from her NG tube. We will check abdominal x-ray to rule out any obstruction.  Code Status: Full code Family Communication: No family present and unable to reach daughter via phone Disposition Plan: pending hospital course  Time spent: critical care: 41mins  [facility] Pager [phone]

## 2013-05-21 NOTE — Progress Notes (Addendum)
ANTIBIOTIC CONSULT NOTE - INITIAL  Pharmacy Consult for Vancomycin & Zosyn Indication: UTI, sepsis  Allergies  Allergen Reactions  . Quinine Derivatives Other (See Comments)    Alters mental status    Patient Measurements: Weight: 230 lb (104.327 kg) Adjusted Body Weight:   Vital Signs: Temp src: Rectal (12/26 1413) BP: 148/84 mmHg (12/26 1435) Pulse Rate: 123 (12/26 1435) Intake/Output from previous day:   Intake/Output from this shift:    Labs:  Recent Labs  05/21/13 1145  WBC 18.3*  HGB 11.2*  PLT 186  CREATININE 2.24*   The CrCl is unknown because both a height and weight (above a minimum accepted value) are required for this calculation. No results found for this basename: VANCOTROUGH, VANCOPEAK, VANCORANDOM, GENTTROUGH, GENTPEAK, GENTRANDOM, TOBRATROUGH, TOBRAPEAK, TOBRARND, AMIKACINPEAK, AMIKACINTROU, AMIKACIN,  in the last 72 hours   Microbiology: No results found for this or any previous visit (from the past 720 hour(s)).  Medical History: Past Medical History  Diagnosis Date  . Paraplegia (lower)     Medications:  Scheduled:  . lidocaine (cardiac) 100 mg/42ml      . rocuronium       Assessment: Unresponsive in ED Supra pubic cath in place, foul smelling odor, yellow drainage SCR 2.24 Calculated CrCl 25 ml/min  Goal of Therapy:  Vancomycin trough level 15-20 mcg/ml  Plan:  Vancomycin 1 GM IV every 24 hours Zosyn 3.375 GM IV every 8 hours, each dose over 4 hours Vancomycin trough at steady state Monitor renal function Labs per protocol  Abner Greenspan, Linnet Bottari Bennett 05/21/2013,3:08 PM

## 2013-05-21 NOTE — ED Notes (Signed)
Pt's sister reports she spoke with pt via telephone last and pt was alert at that time.

## 2013-05-21 NOTE — ED Notes (Addendum)
Pt found by Caregiver/home health aide this morning unresponsive.  Reports last seen by caregiver x 2 days ago and states pt was altered then.  Pt posturing upon arrival. EMS reports pt's aide states she has a bottle of hydrocodone that was filled x 3 days ago that is missing.  Narcan 2mg  given by EMS in route with no response.

## 2013-05-22 ENCOUNTER — Inpatient Hospital Stay (HOSPITAL_COMMUNITY): Payer: Medicaid Other

## 2013-05-22 LAB — BASIC METABOLIC PANEL
BUN: 24 mg/dL — ABNORMAL HIGH (ref 6–23)
CO2: 15 mEq/L — ABNORMAL LOW (ref 19–32)
Chloride: 106 mEq/L (ref 96–112)
Creatinine, Ser: 2.27 mg/dL — ABNORMAL HIGH (ref 0.50–1.10)
Potassium: 3.5 mEq/L (ref 3.5–5.1)

## 2013-05-22 LAB — CORTISOL: Cortisol, Plasma: 11.9 ug/dL

## 2013-05-22 LAB — CBC
HCT: 32.6 % — ABNORMAL LOW (ref 36.0–46.0)
Hemoglobin: 10.8 g/dL — ABNORMAL LOW (ref 12.0–15.0)
MCHC: 33.1 g/dL (ref 30.0–36.0)
MCV: 89.3 fL (ref 78.0–100.0)
RDW: 14.4 % (ref 11.5–15.5)
WBC: 18.6 10*3/uL — ABNORMAL HIGH (ref 4.0–10.5)

## 2013-05-22 LAB — BLOOD GAS, ARTERIAL
Acid-base deficit: 10.3 mmol/L — ABNORMAL HIGH (ref 0.0–2.0)
Drawn by: 22223
FIO2: 40 %
MECHVT: 500 mL
O2 Saturation: 97.2 %
TCO2: 14 mmol/L (ref 0–100)

## 2013-05-22 MED ORDER — SODIUM CHLORIDE 0.9 % IV BOLUS (SEPSIS)
1000.0000 mL | Freq: Once | INTRAVENOUS | Status: AC
  Administered 2013-05-22: 1000 mL via INTRAVENOUS

## 2013-05-22 MED ORDER — SODIUM CHLORIDE 0.9 % IJ SOLN: 10.0000 mL | INTRAMUSCULAR | Status: DC | PRN

## 2013-05-22 MED ORDER — NOREPINEPHRINE BITARTRATE 1 MG/ML IJ SOLN
2.0000 ug/min | INTRAVENOUS | Status: DC
  Administered 2013-05-22: 12 ug/min via INTRAVENOUS
  Administered 2013-05-22: 5 ug/min via INTRAVENOUS
  Filled 2013-05-22: qty 4

## 2013-05-22 MED ORDER — SODIUM CHLORIDE 0.9 % IJ SOLN: 10.0000 mL | Freq: Two times a day (BID) | INTRAMUSCULAR | Status: DC

## 2013-05-22 MED ORDER — CIPROFLOXACIN IN D5W 400 MG/200ML IV SOLN
400.0000 mg | INTRAVENOUS | Status: DC
  Administered 2013-05-22 – 2013-05-24 (×4): 400 mg via INTRAVENOUS
  Filled 2013-05-22 (×5): qty 200

## 2013-05-22 MED ORDER — NOREPINEPHRINE BITARTRATE 1 MG/ML IJ SOLN
INTRAMUSCULAR | Status: AC
  Filled 2013-05-22: qty 4

## 2013-05-22 MED ORDER — CIPROFLOXACIN IN D5W 400 MG/200ML IV SOLN
INTRAVENOUS | Status: AC
  Filled 2013-05-22: qty 200

## 2013-05-22 MED ORDER — DEXTROSE-NACL 5-0.9 % IV SOLN
INTRAVENOUS | Status: DC
  Administered 2013-05-22 – 2013-06-03 (×10): via INTRAVENOUS

## 2013-05-22 MED ORDER — DEXTROSE 5 % IV SOLN
2.0000 ug/min | INTRAVENOUS | Status: DC
  Administered 2013-05-22 – 2013-05-23 (×2): 12 ug/min via INTRAVENOUS
  Filled 2013-05-22 (×4): qty 8

## 2013-05-22 NOTE — Consult Note (Addendum)
Consult requested by: Dr. Legrand Rams Consult requested for respiratory failure:  HPI: This is a 47 year old who was found unresponsive at home. When she was brought to the emergency department she had very slow snoring respirations and was intubated for airway protection. She was hypotensive. She has been felt to be septic. At baseline she is paraplegic. She does not have any known respiratory disease. She is still requiring pressor support and she is still intubated and on the ventilator.  Past Medical History  Diagnosis Date  . Paraplegia (lower)      No family history on file.   History   Social History  . Marital Status: Single    Spouse Name: N/A    Number of Children: N/A  . Years of Education: N/A   Social History Main Topics  . Smoking status: Never Smoker   . Smokeless tobacco: None  . Alcohol Use: No  . Drug Use: No  . Sexual Activity:    Other Topics Concern  . None   Social History Narrative  . None     ROS: Unobtainable    Objective: Vital signs in last 24 hours: Temp:  [90 F (32.2 C)-98.6 F (37 C)] 98.6 F (37 C) (12/27 0800) Pulse Rate:  [25-143] 87 (12/27 0615) Resp:  [0-28] 0 (12/27 0615) BP: (58-148)/(22-103) 83/53 mmHg (12/27 0615) SpO2:  [79 %-100 %] 100 % (12/27 0829) FiO2 (%):  [40 %-100 %] 40 % (12/27 0829) Weight:  [94.4 kg (208 lb 1.8 oz)-104.327 kg (230 lb)] 94.4 kg (208 lb 1.8 oz) (12/27 0449) Weight change:  Last BM Date:  (unknown)  Intake/Output from previous day: 12/26 0701 - 12/27 0700 In: 2964.3 [I.V.:1231; IV Piggyback:1733.3] Out: 1125 [Urine:1125]  PHYSICAL EXAM She is intubated sedated but arousable. Her pupils are reactive. Nose and throat are clear. She does not have any JVD. Her chest shows some rhonchi. Her heart is regular without gallop. Her abdomen is soft without masses. She has a suprapubic catheter in place. She is moving her arms.  Lab Results: Basic Metabolic Panel:  Recent Labs  05/22/13 0500  05/22/13 0504  NA 136 137  K 3.6 3.5  CL 105 106  CO2 18* 15*  GLUCOSE 124* 127*  BUN 23 24*  CREATININE 1.92* 2.27*  CALCIUM 7.1* 6.8*   Liver Function Tests:  Recent Labs  05/21/13 1145  AST 28  ALT 18  ALKPHOS 151*  BILITOT 0.5  PROT 6.6  ALBUMIN 2.2*    Recent Labs  05/21/13 1820  LIPASE 16    Recent Labs  05/21/13 1146 05/21/13 1808  AMMONIA 72* 28   CBC:  Recent Labs  05/21/13 1145  05/22/13 0500 05/22/13 0504  WBC 18.3*  < > 15.5* 18.6*  NEUTROABS 16.0*  --   --   --   HGB 11.2*  < > 11.7* 10.8*  HCT 33.7*  < > 34.9* 32.6*  MCV 92.3  < > 89.9 89.3  PLT 186  < > 223 245  < > = values in this interval not displayed. Cardiac Enzymes:  Recent Labs  05/21/13 1145 05/21/13 1820  CKTOTAL  --  185*  TROPONINI <0.30  --    BNP:  Recent Labs  05/21/13 1145  PROBNP 1928.0*   D-Dimer: No results found for this basename: DDIMER,  in the last 72 hours CBG: No results found for this basename: GLUCAP,  in the last 72 hours Hemoglobin A1C: No results found for this basename: HGBA1C,  in  the last 72 hours Fasting Lipid Panel: No results found for this basename: CHOL, HDL, LDLCALC, TRIG, CHOLHDL, LDLDIRECT,  in the last 72 hours Thyroid Function Tests:  Recent Labs  05/21/13 1145  TSH 4.492   Anemia Panel: No results found for this basename: VITAMINB12, FOLATE, FERRITIN, TIBC, IRON, RETICCTPCT,  in the last 72 hours Coagulation:  Recent Labs  05/21/13 1145  LABPROT 16.2*  INR 1.33   Urine Drug Screen: Drugs of Abuse     Component Value Date/Time   LABOPIA NONE DETECTED 05/21/2013 1358   [place] 05/21/2013 1358   LABBENZ NONE DETECTED 05/21/2013 1358   AMPHETMU NONE DETECTED 05/21/2013 1358   THCU NONE DETECTED 05/21/2013 1358   LABBARB NONE DETECTED 05/21/2013 1358    Alcohol Level:  Recent Labs  05/21/13 1145  ETH <11   Urinalysis:  Recent Labs  05/21/13 1358  COLORURINE YELLOW  LABSPEC 1.015   PHURINE 7.0  GLUCOSEU NEGATIVE  HGBUR LARGE*  BILIRUBINUR NEGATIVE  KETONESUR NEGATIVE  PROTEINUR 30*  UROBILINOGEN 0.2  NITRITE POSITIVE*  LEUKOCYTESUR LARGE*   Misc. Labs:   ABGS:  Recent Labs  05/22/13 0556  PHART 7.300*  PO2ART 94.2  TCO2 14.0  HCO3 14.9*     MICROBIOLOGY: Recent Results (from the past 240 hour(s))  CULTURE, BLOOD (ROUTINE X 2)     Status: None   Collection Time    05/21/13 11:46 AM      Result Value Range Status   Specimen Description RIGHT ANTECUBITAL   Final   Special Requests BOTTLES DRAWN AEROBIC AND ANAEROBIC 10CC EACH   Final   Culture     Final   Value: GRAM NEGATIVE RODS     Gram Stain Report Called to,Read Back By and Verified With: [id number] RN AT 2339 ON 05/21/13 BY S.VANHOORNE   Report Status PENDING   Incomplete  CULTURE, BLOOD (ROUTINE X 2)     Status: None   Collection Time    05/21/13 11:51 AM      Result Value Range Status   Specimen Description RIGHT ANTECUBITAL   Final   Special Requests BOTTLES DRAWN AEROBIC AND ANAEROBIC 12CC EACH   Final   Culture     Final   Value: GRAM NEGATIVE RODS     Gram Stain Report Called to,Read Back By and Verified With: R.WAGONER AT 2243 ON 05/21/13 BY S.VANHOORNE   Report Status PENDING   Incomplete  MRSA PCR SCREENING     Status: Abnormal   Collection Time    05/21/13  5:00 PM      Result Value Range Status   MRSA by PCR POSITIVE (*) NEGATIVE Final   Comment:            The GeneXpert MRSA Assay (FDA     approved for NASAL specimens     only), is one component of a     comprehensive MRSA colonization     surveillance program. It is not     intended to diagnose MRSA     infection nor to guide or     monitor treatment for     MRSA infections.     RESULT CALLED TO, READ BACK BY AND VERIFIED WITH:     M.T. AT 1905 ON 05/21/13 BY S.VANHOORNE    Studies/Results: Dg Abd 1 View  05/21/2013   CLINICAL DATA:  Abdominal distention.  EXAM: ABDOMEN - 1 VIEW  COMPARISON:  May 21, 2013  CT scan of the abdomen and  pelvis  FINDINGS: The study is quite limited due to scatter effects secondary to the patient's body habitus. The bowel gas pattern rib visualized does not appear abnormal. There may be distention of the urinary bladder but this is not a definite finding. There is a phlebolith in the right aspect of the pelvis. There is a nasogastric tube present whose tip likely lies below the level of the GE junction but the proximal port may lie above it. A chest x-ray would be useful to help determine this further.  IMPRESSION: The study is quite limited due to the patient's body habitus. I cannot exclude distention of the urinary bladder but the findings are not definite. Please see the note above regarding positioning of the nasogastric tube.   Electronically Signed   By: David  Martinique   On: 05/21/2013 19:11   Ct Head Wo Contrast  05/21/2013   CLINICAL DATA:  Unresponsive.  EXAM: CT HEAD WITHOUT CONTRAST  TECHNIQUE: Contiguous axial images were obtained from the base of the skull through the vertex without intravenous contrast.  COMPARISON:  None.  FINDINGS: No mass. No hydrocephalus. No hemorrhage. Lucency noted in the left frontal white matter and cortex most likely from prior stroke. To exclude a significant underlying lesion such as a tumor MRI the brain is suggested. No acute bony abnormality identified. Air-fluid level in the left maxillary and sphenoid sinus. Sinusitis could present in this fashion. NG tube is noted. Soft tissue swelling in the nasopharynx.  IMPRESSION: 1. Left frontal white matter and cortical lucency most likely from prior stroke, to exclude underlying lesion including tumor MRI the brain can be obtained. 2. Air-fluid level left maxillary and sphenoid sinus. These findings would be consistent with sinusitis. NG tube is noted. Poor aeration of the nasopharynx with soft tissue swelling is noted.   Electronically Signed   By: Marcello Moores  Register   On: 05/21/2013 13:19   Dg  Chest Port 1 View  05/22/2013   CLINICAL DATA:  Hypoxia  EXAM: PORTABLE CHEST - 1 VIEW  COMPARISON:  May 21, 2013  FINDINGS: Endotracheal tube tip is 4.6 cm above the carina. Nasogastric tube tip and side port are in the stomach. Central catheter tip is in the superior vena cava. There is no demonstrable pneumothorax.  There is focal consolidation in the left mid lung. There is atelectasis in the right base, however. Heart is mildly prominent with normal pulmonary vascularity. No adenopathy. Postoperative change is noted in the thoracic spine.  IMPRESSION: The tube and catheter positions as described. No pneumothorax. Focal infiltrate left mid lung. Patchy atelectasis right base.   Electronically Signed   By: Lowella Grip M.D.   On: 05/22/2013 08:33   Dg Chest Port 1 View  05/21/2013   CLINICAL DATA:  New right central venous catheter placement  EXAM: PORTABLE CHEST - 1 VIEW  COMPARISON:  05/21/2013  FINDINGS: Thoracic fusion hardware noted. Nasogastric tube tip terminates below the level of the hemidiaphragms but is not included in the field of view. Tip of the endotracheal tube is obscured by hardware but appears to terminate above the level of the carina. Right subclavian approach central line tip is obscured by hardware but terminates at least at the level of the proximal SVC. No pneumothorax. Left-sided retrocardiac airspace opacity persists. Chronic right subclavian deformity reidentified.  IMPRESSION: Right subclavian approach central line tip is at least in the level of the proximal SVC but is obscured by overlying thoracic spinal hardware. Consider oblique view for  possible improved visualization.   Electronically Signed   By: Conchita Paris M.D.   On: 05/21/2013 19:10   Dg Chest Portable 1 View  05/21/2013   CLINICAL DATA:  Post intubation  EXAM: PORTABLE CHEST - 1 VIEW  COMPARISON:  Portable exam 1201 hr compared to 06/25/2012  FINDINGS: Tip of endotracheal tube at or very near the  carina and right mainstem bronchus origin, carina poorly localized.  Nasogastric tube extends into abdomen.  Orthopedic hardware in the thoracic spine limits assessment of the endotracheal tube and carina.  Numerous EKG leads project over chest.  Enlargement of cardiac silhouette with pulmonary vascular congestion.  Question mild basilar atelectasis versus infiltrate.  Upper lungs clear.  No gross pneumothorax.  Old mid right clavicular fracture.  IMPRESSION: Chronic is poorly localized but the tip of the endotracheal tube is likely at or very near the carina and origin of the right mainstem bronchus; consider withdrawal 1-2 cm.  Enlargement of cardiac silhouette with pulmonary vascular congestion.  Probable mild bibasilar atelectasis versus infiltrate.  Findings called to Dr. Betsey Holiday on 05/21/2013 at 1218 hr.   Electronically Signed   By: Lavonia Dana M.D.   On: 05/21/2013 12:18    Medications:  Prior to Admission:  Prescriptions prior to admission  Medication Sig Dispense Refill  . baclofen (LIORESAL) 20 MG tablet Take 40 mg by mouth 4 (four) times daily.       . dantrolene (DANTRIUM) 25 MG capsule Take 25 mg by mouth daily.       . diazepam (VALIUM) 5 MG tablet Take 5 mg by mouth every 12 (twelve) hours as needed. For anxiety      . docusate sodium (COLACE) 100 MG capsule Take 100 mg by mouth at bedtime.      . fesoterodine (TOVIAZ) 4 MG TB24 Take 1 tablet (4 mg total) by mouth daily.  30 tablet  0  . gabapentin (NEURONTIN) 300 MG capsule Take 300 mg by mouth 2 (two) times daily.       Marland Kitchen HYDROcodone-acetaminophen (NORCO/VICODIN) 5-325 MG per tablet Take 1 tablet by mouth Every 4 hours as needed. Pain.      . lidocaine (LIDODERM) 5 % Place 1 patch onto the skin daily. Remove & Discard patch within 12 hours or as directed by MD      . Multiple Vitamin (MULTIVITAMIN) tablet Take 1 tablet by mouth daily.      . naproxen (NAPROSYN) 500 MG tablet Take 500 mg by mouth 2 (two) times daily with a meal.          Scheduled: . albuterol  2.5 mg Nebulization Q6H   And  . ipratropium  0.5 mg Nebulization Q6H  . antiseptic oral rinse  15 mL Mouth Rinse QID  . chlorhexidine  15 mL Mouth Rinse BID  . Chlorhexidine Gluconate Cloth  6 each Topical Q0600  . ciprofloxacin  400 mg Intravenous Q18H  . enoxaparin (LOVENOX) injection  40 mg Subcutaneous Q24H  . mupirocin ointment  1 application Nasal BID  . pantoprazole (PROTONIX) IV  40 mg Intravenous QHS  . piperacillin-tazobactam (ZOSYN)  IV  3.375 g Intravenous Q8H  . sodium chloride  10-40 mL Intracatheter Q12H  . vancomycin  1,000 mg Intravenous Q24H   Continuous: . dextrose 5 % and 0.9% NaCl 75 mL/hr at 05/22/13 0920  . DOPamine 2 mcg/kg/min (05/22/13 0800)  . norepinephrine (LEVOPHED) Adult infusion 12 mcg/min (05/22/13 [id number])   [id number] chloride, fentaNYL, fentaNYL, sodium chloride  Assesment:  She was admitted with acute respiratory failure probably on the basis of sepsis. She has gram-negative rods in her urine and in her blood. She has an acute encephalopathy related to her sepsis. She appears to have pyelonephritis. She has been hypokalemic with acute renal failure which I think is probably related to dehydration. She is still hypotensive requiring pressor support. She has what is probably aspiration pneumonia Active Problems:   Paraplegia   Pyelonephritis, acute   Respiratory failure, acute   Sepsis   Encephalopathy acute   Acute renal failure   Hyperkalemia   Hypothermia    Plan: Continue current treatments. She is on appropriate antibiotics. Wean pressors as possible.    LOS: 1 day   Kaelei Wheeler L 05/22/2013, 10:38 AM

## 2013-05-22 NOTE — Progress Notes (Signed)
Subjective: Patient was admitted yesterday after she was found unresponsive and in respiratory distress. Patient is currently intubated, sedated and on on ventilatory support.  Objective: Vital signs in last 24 hours: Temp:  [90 F (32.2 C)-98.6 F (37 C)] 98.6 F (37 C) (12/27 0800) Pulse Rate:  [25-143] 87 (12/27 0615) Resp:  [0-28] 0 (12/27 0615) BP: (58-148)/(22-103) 83/53 mmHg (12/27 0615) SpO2:  [79 %-100 %] 100 % (12/27 0829) FiO2 (%):  [40 %-100 %] 40 % (12/27 0829) Weight:  [94.4 kg (208 lb 1.8 oz)-104.327 kg (230 lb)] 94.4 kg (208 lb 1.8 oz) (12/27 0449) Weight change:  Last BM Date:  (unknown)  Intake/Output from previous day: 12/26 0701 - 12/27 0700 In: 2964.3 [I.V.:1231; IV Piggyback:1733.3] Out: 1125 [Urine:1125]  PHYSICAL EXAM General appearance: moderate distress and slowed mentation Resp: diminished breath sounds bilaterally and rhonchi bilaterally Cardio: S1, S2 normal GI: soft, non-tender; bowel sounds normal; no masses,  no organomegaly Extremities: paraplegic and deformed lower extremities  Lab Results:    @labtest @ ABGS  Recent Labs  05/22/13 0556  PHART 7.300*  PO2ART 94.2  TCO2 14.0  HCO3 14.9*   CULTURES Recent Results (from the past 240 hour(s))  CULTURE, BLOOD (ROUTINE X 2)     Status: None   Collection Time    05/21/13 11:46 AM      Result Value Range Status   Specimen Description RIGHT ANTECUBITAL   Final   Special Requests BOTTLES DRAWN AEROBIC AND ANAEROBIC 10CC EACH   Final   Culture     Final   Value: GRAM NEGATIVE RODS     Gram Stain Report Called to,Read Back By and Verified With: D.BREWER RN AT 2339 ON 05/21/13 BY S.VANHOORNE   Report Status PENDING   Incomplete  CULTURE, BLOOD (ROUTINE X 2)     Status: None   Collection Time    05/21/13 11:51 AM      Result Value Range Status   Specimen Description RIGHT ANTECUBITAL   Final   Special Requests BOTTLES DRAWN AEROBIC AND ANAEROBIC 12CC EACH   Final   Culture     Final   Value: GRAM NEGATIVE RODS     Gram Stain Report Called to,Read Back By and Verified With: R.WAGONER AT 2243 ON 05/21/13 BY S.VANHOORNE   Report Status PENDING   Incomplete  MRSA PCR SCREENING     Status: Abnormal   Collection Time    05/21/13  5:00 PM      Result Value Range Status   MRSA by PCR POSITIVE (*) NEGATIVE Final   Comment:            The GeneXpert MRSA Assay (FDA     approved for NASAL specimens     only), is one component of a     comprehensive MRSA colonization     surveillance program. It is not     intended to diagnose MRSA     infection nor to guide or     monitor treatment for     MRSA infections.     RESULT CALLED TO, READ BACK BY AND VERIFIED WITH:     M.T. AT 1905 ON 05/21/13 BY S.VANHOORNE   Studies/Results: Dg Abd 1 View  05/21/2013   CLINICAL DATA:  Abdominal distention.  EXAM: ABDOMEN - 1 VIEW  COMPARISON:  May 21, 2013 CT scan of the abdomen and pelvis  FINDINGS: The study is quite limited due to scatter effects secondary to the patient's body habitus. The bowel  gas pattern rib visualized does not appear abnormal. There may be distention of the urinary bladder but this is not a definite finding. There is a phlebolith in the right aspect of the pelvis. There is a nasogastric tube present whose tip likely lies below the level of the GE junction but the proximal port may lie above it. A chest x-ray would be useful to help determine this further.  IMPRESSION: The study is quite limited due to the patient's body habitus. I cannot exclude distention of the urinary bladder but the findings are not definite. Please see the note above regarding positioning of the nasogastric tube.   Electronically Signed   By: David  Martinique   On: 05/21/2013 19:11   Ct Head Wo Contrast  05/21/2013   CLINICAL DATA:  Unresponsive.  EXAM: CT HEAD WITHOUT CONTRAST  TECHNIQUE: Contiguous axial images were obtained from the base of the skull through the vertex without intravenous contrast.   COMPARISON:  None.  FINDINGS: No mass. No hydrocephalus. No hemorrhage. Lucency noted in the left frontal white matter and cortex most likely from prior stroke. To exclude a significant underlying lesion such as a tumor MRI the brain is suggested. No acute bony abnormality identified. Air-fluid level in the left maxillary and sphenoid sinus. Sinusitis could present in this fashion. NG tube is noted. Soft tissue swelling in the nasopharynx.  IMPRESSION: 1. Left frontal white matter and cortical lucency most likely from prior stroke, to exclude underlying lesion including tumor MRI the brain can be obtained. 2. Air-fluid level left maxillary and sphenoid sinus. These findings would be consistent with sinusitis. NG tube is noted. Poor aeration of the nasopharynx with soft tissue swelling is noted.   Electronically Signed   By: Marcello Moores  Register   On: 05/21/2013 13:19   Dg Chest Port 1 View  05/22/2013   CLINICAL DATA:  Hypoxia  EXAM: PORTABLE CHEST - 1 VIEW  COMPARISON:  May 21, 2013  FINDINGS: Endotracheal tube tip is 4.6 cm above the carina. Nasogastric tube tip and side port are in the stomach. Central catheter tip is in the superior vena cava. There is no demonstrable pneumothorax.  There is focal consolidation in the left mid lung. There is atelectasis in the right base, however. Heart is mildly prominent with normal pulmonary vascularity. No adenopathy. Postoperative change is noted in the thoracic spine.  IMPRESSION: The tube and catheter positions as described. No pneumothorax. Focal infiltrate left mid lung. Patchy atelectasis right base.   Electronically Signed   By: Lowella Grip M.D.   On: 05/22/2013 08:33   Dg Chest Port 1 View  05/21/2013   CLINICAL DATA:  New right central venous catheter placement  EXAM: PORTABLE CHEST - 1 VIEW  COMPARISON:  05/21/2013  FINDINGS: Thoracic fusion hardware noted. Nasogastric tube tip terminates below the level of the hemidiaphragms but is not included in  the field of view. Tip of the endotracheal tube is obscured by hardware but appears to terminate above the level of the carina. Right subclavian approach central line tip is obscured by hardware but terminates at least at the level of the proximal SVC. No pneumothorax. Left-sided retrocardiac airspace opacity persists. Chronic right subclavian deformity reidentified.  IMPRESSION: Right subclavian approach central line tip is at least in the level of the proximal SVC but is obscured by overlying thoracic spinal hardware. Consider oblique view for possible improved visualization.   Electronically Signed   By: Conchita Paris M.D.   On: 05/21/2013 19:10  Dg Chest Portable 1 View  05/21/2013   CLINICAL DATA:  Post intubation  EXAM: PORTABLE CHEST - 1 VIEW  COMPARISON:  Portable exam 1201 hr compared to 06/25/2012  FINDINGS: Tip of endotracheal tube at or very near the carina and right mainstem bronchus origin, carina poorly localized.  Nasogastric tube extends into abdomen.  Orthopedic hardware in the thoracic spine limits assessment of the endotracheal tube and carina.  Numerous EKG leads project over chest.  Enlargement of cardiac silhouette with pulmonary vascular congestion.  Question mild basilar atelectasis versus infiltrate.  Upper lungs clear.  No gross pneumothorax.  Old mid right clavicular fracture.  IMPRESSION: Chronic is poorly localized but the tip of the endotracheal tube is likely at or very near the carina and origin of the right mainstem bronchus; consider withdrawal 1-2 cm.  Enlargement of cardiac silhouette with pulmonary vascular congestion.  Probable mild bibasilar atelectasis versus infiltrate.  Findings called to Dr. Betsey Holiday on 05/21/2013 at 1218 hr.   Electronically Signed   By: Lavonia Dana M.D.   On: 05/21/2013 12:18    Medications: I have reviewed the patient's current medications.  Assesment: Active Problems:   Paraplegia   Pyelonephritis, acute   Respiratory failure, acute    Sepsis   Encephalopathy acute   Acute renal failure   Hyperkalemia   Hypothermia    Plan: Medications reviewed Continue ventilatory support Pulmonary consult Continue current treatment     LOS: 1 day   Qunisha Bryk 05/22/2013, 8:58 AM
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
## 2013-05-22 NOTE — Progress Notes (Signed)
eLink Physician-Brief Progress Note Patient Name: [name] DOB: Mar 15, 1966 MRN: [id number]  Date of Service  05/22/2013   HPI/Events of Note  Hypotensive on dopamine CVp 4 GNR -blood  eICU Interventions  Fluid bolus - get CVP up to 10 range Start levophed Add cipro for double coverage Renal US to r/o obstructive etio   Intervention Category Major Interventions: Sepsis - evaluation and management  Natelie Ostrosky V. 05/22/2013, 2:28 AM

## 2013-05-23 ENCOUNTER — Inpatient Hospital Stay (HOSPITAL_COMMUNITY): Payer: Medicaid Other

## 2013-05-23 LAB — BLOOD GAS, ARTERIAL
Acid-base deficit: 8.8 mmol/L — ABNORMAL HIGH (ref 0.0–2.0)
Bicarbonate: 16.6 mEq/L — ABNORMAL LOW (ref 20.0–24.0)
FIO2: 40 %
MECHVT: 500 mL
O2 Saturation: 98.6 %
TCO2: 16 mmol/L (ref 0–100)
pCO2 arterial: 36.4 mmHg (ref 35.0–45.0)
pO2, Arterial: 134 mmHg — ABNORMAL HIGH (ref 80.0–100.0)

## 2013-05-23 LAB — CREATININE, URINE, RANDOM: Creatinine, Urine: 14.96 mg/dL

## 2013-05-23 LAB — URINE CULTURE

## 2013-05-23 LAB — CBC
MCH: 29 pg (ref 26.0–34.0)
MCHC: 32.6 g/dL (ref 30.0–36.0)
MCV: 89 fL (ref 78.0–100.0)
Platelets: 211 10*3/uL (ref 150–400)
RBC: 3.35 MIL/uL — ABNORMAL LOW (ref 3.87–5.11)
RDW: 14.9 % (ref 11.5–15.5)

## 2013-05-23 LAB — BASIC METABOLIC PANEL
BUN: 23 mg/dL (ref 6–23)
Calcium: 7.4 mg/dL — ABNORMAL LOW (ref 8.4–10.5)
Creatinine, Ser: 2.8 mg/dL — ABNORMAL HIGH (ref 0.50–1.10)
GFR calc non Af Amer: 19 mL/min — ABNORMAL LOW (ref >90)
Glucose, Bld: 136 mg/dL — ABNORMAL HIGH (ref 70–99)
Potassium: 3.8 mEq/L (ref 3.5–5.1)
Sodium: 136 mEq/L (ref 135–145)

## 2013-05-23 LAB — SODIUM, URINE, RANDOM: Sodium, Ur: 90 mEq/L

## 2013-05-23 MED ORDER — PIPERACILLIN-TAZOBACTAM 3.375 G IVPB
3.3750 g | Freq: Two times a day (BID) | INTRAVENOUS | Status: DC
  Filled 2013-05-23 (×2): qty 50

## 2013-05-23 NOTE — Progress Notes (Signed)
Subjective: Patient is just opening her eyes. She remained on ventilllatory support.  Objective: Vital signs in last 24 hours: Temp:  [98.1 F (36.7 C)-99.2 F (37.3 C)] 99.1 F (37.3 C) (12/28 0800) Pulse Rate:  [33-218] 96 (12/28 0845) Resp:  [9-23] 12 (12/28 0845) BP: (86-165)/(56-91) 134/72 mmHg (12/28 0845) SpO2:  [80 %-100 %] 100 % (12/28 0845) FiO2 (%):  [40 %] 40 % (12/28 0833) Weight:  [89.8 kg (197 lb 15.6 oz)] 89.8 kg (197 lb 15.6 oz) (12/28 0500) Weight change: -14.527 kg (-32 lb 0.4 oz) Last BM Date:  (unknown)  Intake/Output from previous day: 12/27 0701 - 12/28 0700 In: 3272.5 [I.V.:2722.5; IV Piggyback:550] Out: 2700 [Urine:2700]  PHYSICAL EXAM General appearance: moderate distress and slowed mentation Resp: diminished breath sounds bilaterally and rhonchi bilaterally Cardio: S1, S2 normal GI: soft, non-tender; bowel sounds normal; no masses,  no organomegaly Extremities: paraplegic and deformed lower extremities  Lab Results:    @labtest @ ABGS  Recent Labs  05/22/13 0556  PHART 7.300*  PO2ART 94.2  TCO2 14.0  HCO3 14.9*   CULTURES Recent Results (from the past 240 hour(s))  CULTURE, BLOOD (ROUTINE X 2)     Status: None   Collection Time    05/21/13 11:46 AM      Result Value Range Status   Specimen Description RIGHT ANTECUBITAL   Final   Special Requests BOTTLES DRAWN AEROBIC AND ANAEROBIC 10CC EACH   Final   Culture     Final   Value: GRAM NEGATIVE RODS     Gram Stain Report Called to,Read Back By and Verified With: D.BREWER RN AT 2339 ON 05/21/13 BY S.VANHOORNE   Report Status PENDING   Incomplete  CULTURE, BLOOD (ROUTINE X 2)     Status: None   Collection Time    05/21/13 11:51 AM      Result Value Range Status   Specimen Description RIGHT ANTECUBITAL   Final   Special Requests BOTTLES DRAWN AEROBIC AND ANAEROBIC 12CC EACH   Final   Culture     Final   Value: GRAM NEGATIVE RODS     Gram Stain Report Called to,Read Back By and  Verified With: R.WAGONER AT 2243 ON 05/21/13 BY S.VANHOORNE   Report Status PENDING   Incomplete  MRSA PCR SCREENING     Status: Abnormal   Collection Time    05/21/13  5:00 PM      Result Value Range Status   MRSA by PCR POSITIVE (*) NEGATIVE Final   Comment:            The GeneXpert MRSA Assay (FDA     approved for NASAL specimens     only), is one component of a     comprehensive MRSA colonization     surveillance program. It is not     intended to diagnose MRSA     infection nor to guide or     monitor treatment for     MRSA infections.     RESULT CALLED TO, READ BACK BY AND VERIFIED WITH:     M.T. AT 1905 ON 05/21/13 BY S.VANHOORNE   Studies/Results: Dg Abd 1 View  05/21/2013   CLINICAL DATA:  Abdominal distention.  EXAM: ABDOMEN - 1 VIEW  COMPARISON:  May 21, 2013 CT scan of the abdomen and pelvis  FINDINGS: The study is quite limited due to scatter effects secondary to the patient's body habitus. The bowel gas pattern rib visualized does not appear abnormal. There may  be distention of the urinary bladder but this is not a definite finding. There is a phlebolith in the right aspect of the pelvis. There is a nasogastric tube present whose tip likely lies below the level of the GE junction but the proximal port may lie above it. A chest x-ray would be useful to help determine this further.  IMPRESSION: The study is quite limited due to the patient's body habitus. I cannot exclude distention of the urinary bladder but the findings are not definite. Please see the note above regarding positioning of the nasogastric tube.   Electronically Signed   By: David  Martinique   On: 05/21/2013 19:11   Ct Head Wo Contrast  05/21/2013   CLINICAL DATA:  Unresponsive.  EXAM: CT HEAD WITHOUT CONTRAST  TECHNIQUE: Contiguous axial images were obtained from the base of the skull through the vertex without intravenous contrast.  COMPARISON:  None.  FINDINGS: No mass. No hydrocephalus. No hemorrhage.  Lucency noted in the left frontal white matter and cortex most likely from prior stroke. To exclude a significant underlying lesion such as a tumor MRI the brain is suggested. No acute bony abnormality identified. Air-fluid level in the left maxillary and sphenoid sinus. Sinusitis could present in this fashion. NG tube is noted. Soft tissue swelling in the nasopharynx.  IMPRESSION: 1. Left frontal white matter and cortical lucency most likely from prior stroke, to exclude underlying lesion including tumor MRI the brain can be obtained. 2. Air-fluid level left maxillary and sphenoid sinus. These findings would be consistent with sinusitis. NG tube is noted. Poor aeration of the nasopharynx with soft tissue swelling is noted.   Electronically Signed   By: Marcello Moores  Register   On: 05/21/2013 13:19   US Renal  05/22/2013   CLINICAL DATA:  Rule out stenosis  EXAM: RENAL/URINARY TRACT ULTRASOUND COMPLETE  COMPARISON:  CT 06/21/2012  FINDINGS: Right Kidney:  Length: 10.8 cm in length. The right kidney is markedly irregular. The entire upper pole is heterogeneous. There are hyperechoic and hypoechoic areas. Central mass cannot be excluded. No hydronephrosis.  Left Kidney:  Length: 13.6 cm in length. No hydronephrosis or mass. Irregular upper pole contour.  Bladder:  Foley catheter decompresses the bladder.  IMPRESSION: No hydronephrosis.  The upper pole of the right kidney is markedly irregular underlying central mass cannot be excluded. CT or MR can be performed to further characterize.   Electronically Signed   By: Maryclare Bean M.D.   On: 05/22/2013 14:13   Dg Chest Port 1 View  05/22/2013   CLINICAL DATA:  Hypoxia  EXAM: PORTABLE CHEST - 1 VIEW  COMPARISON:  May 21, 2013  FINDINGS: Endotracheal tube tip is 4.6 cm above the carina. Nasogastric tube tip and side port are in the stomach. Central catheter tip is in the superior vena cava. There is no demonstrable pneumothorax.  There is focal consolidation in the  left mid lung. There is atelectasis in the right base, however. Heart is mildly prominent with normal pulmonary vascularity. No adenopathy. Postoperative change is noted in the thoracic spine.  IMPRESSION: The tube and catheter positions as described. No pneumothorax. Focal infiltrate left mid lung. Patchy atelectasis right base.   Electronically Signed   By: Lowella Grip M.D.   On: 05/22/2013 08:33   Dg Chest Port 1 View  05/21/2013   CLINICAL DATA:  New right central venous catheter placement  EXAM: PORTABLE CHEST - 1 VIEW  COMPARISON:  05/21/2013  FINDINGS: Thoracic fusion hardware  noted. Nasogastric tube tip terminates below the level of the hemidiaphragms but is not included in the field of view. Tip of the endotracheal tube is obscured by hardware but appears to terminate above the level of the carina. Right subclavian approach central line tip is obscured by hardware but terminates at least at the level of the proximal SVC. No pneumothorax. Left-sided retrocardiac airspace opacity persists. Chronic right subclavian deformity reidentified.  IMPRESSION: Right subclavian approach central line tip is at least in the level of the proximal SVC but is obscured by overlying thoracic spinal hardware. Consider oblique view for possible improved visualization.   Electronically Signed   By: Conchita Paris M.D.   On: 05/21/2013 19:10   Dg Chest Portable 1 View  05/21/2013   CLINICAL DATA:  Post intubation  EXAM: PORTABLE CHEST - 1 VIEW  COMPARISON:  Portable exam 1201 hr compared to 06/25/2012  FINDINGS: Tip of endotracheal tube at or very near the carina and right mainstem bronchus origin, carina poorly localized.  Nasogastric tube extends into abdomen.  Orthopedic hardware in the thoracic spine limits assessment of the endotracheal tube and carina.  Numerous EKG leads project over chest.  Enlargement of cardiac silhouette with pulmonary vascular congestion.  Question mild basilar atelectasis versus  infiltrate.  Upper lungs clear.  No gross pneumothorax.  Old mid right clavicular fracture.  IMPRESSION: Chronic is poorly localized but the tip of the endotracheal tube is likely at or very near the carina and origin of the right mainstem bronchus; consider withdrawal 1-2 cm.  Enlargement of cardiac silhouette with pulmonary vascular congestion.  Probable mild bibasilar atelectasis versus infiltrate.  Findings called to Dr. Betsey Holiday on 05/21/2013 at 1218 hr.   Electronically Signed   By: Lavonia Dana M.D.   On: 05/21/2013 12:18    Medications: I have reviewed the patient's current medications.  Assesment: Active Problems:   Paraplegia   Pyelonephritis, acute   Respiratory failure, acute   Sepsis   Encephalopathy acute   Acute renal failure   Hyperkalemia   Hypothermia    Plan: Medications reviewed Continue ventilatory support Pulmonary consult appreciated Nephrology consult appreciated     LOS: 2 days   Aragorn Recker 05/23/2013, 9:11 AM

## 2013-05-23 NOTE — Progress Notes (Signed)
ANTIBIOTIC CONSULT NOTE - FOLLOW UP  Pharmacy Consult for Cipro, Zosyn, Vancomycin Indication: increased coverage for pseudomonas sepsis (also on vancomycin and Zosyn)  Allergies  Allergen Reactions  . Quinine Derivatives Other (See Comments)    Alters mental status    Patient Measurements: Weight: 197 lb 15.6 oz (89.8 kg)   Vital Signs: Temp: 99.5 F (37.5 C) (12/28 2000) Temp src: Rectal (12/28 2000) BP: 147/80 mmHg (12/28 2145) Pulse Rate: 110 (12/28 2310) Intake/Output from previous day: 12/27 0701 - 12/28 0700 In: 3272.5 [I.V.:2722.5; IV Piggyback:550] Out: 2700 [Urine:2700] Intake/Output from this shift: Total I/O In: 346.3 [I.V.:346.3] Out: -   Labs:  Recent Labs  05/22/13 0500 05/22/13 0504 05/23/13 0526 05/23/13 1615  WBC 15.5* 18.6* 16.5*  --   HGB 11.7* 10.8* 9.7*  --   PLT 223 245 211  --   LABCREA  --   --   --  14.96  CREATININE 1.92* 2.27* 2.80*  --    The CrCl is unknown because both a height and weight (above a minimum accepted value) are required for this calculation. No results found for this basename: VANCOTROUGH, Corlis Leak, VANCORANDOM, [place], GENTPEAK, [place], [place], [place], TOBRARND, AMIKACINPEAK, AMIKACINTROU, AMIKACIN,  in the last 72 hours   Microbiology: Recent Results (from the past 720 hour(s))  CULTURE, BLOOD (ROUTINE X 2)     Status: None   Collection Time    05/21/13 11:46 AM      Result Value Range Status   Specimen Description RIGHT ANTECUBITAL   Final   Special Requests BOTTLES DRAWN AEROBIC AND ANAEROBIC 10CC EACH   Final   Culture  Setup Time     Final   Value: 05/22/2013 21:42     Performed at [facility]   Culture     Final   Value: GRAM NEGATIVE RODS     Note: Gram Stain Report Called to,Read Back By and Verified With: [id number] RN AT 2339 ON 05/21/13 BY S.VANHOORNE Performed at [facility]     Performed at [facility]   Report Status PENDING   Incomplete  CULTURE, BLOOD  (ROUTINE X 2)     Status: None   Collection Time    05/21/13 11:51 AM      Result Value Range Status   Specimen Description RIGHT ANTECUBITAL   Final   Special Requests BOTTLES DRAWN AEROBIC AND ANAEROBIC 12CC EACH   Final   Culture  Setup Time     Final   Value: 05/22/2013 21:29     Performed at [facility]   Culture     Final   Value: GRAM NEGATIVE RODS     Note: Gram Stain Report Called to,Read Back By and Verified With: R.WAGONER AT 2243 ON 05/21/13 BY S.VANHOORNE Performed at [facility]     Performed at [facility]   Report Status PENDING   Incomplete  URINE CULTURE     Status: None   Collection Time    05/21/13  1:58 PM      Result Value Range Status   Specimen Description URINE, CATHETERIZED   Final   Special Requests NONE   Final   Culture  Setup Time     Final   Value: 05/22/2013 19:13     Performed at [place]     Final   Value: >=100,000 COLONIES/ML     Performed at [facility]  Final   Value: Multiple bacterial morphotypes present, none predominant. Suggest appropriate recollection if clinically indicated.     Performed at [facility]   Report Status 05/23/2013 FINAL   Final  MRSA PCR SCREENING     Status: Abnormal   Collection Time    05/21/13  5:00 PM      Result Value Range Status   MRSA by PCR POSITIVE (*) NEGATIVE Final   Comment:            The GeneXpert MRSA Assay (FDA     approved for NASAL specimens     only), is one component of a     comprehensive MRSA colonization     surveillance program. It is not     intended to diagnose MRSA     infection nor to guide or     monitor treatment for     MRSA infections.     RESULT CALLED TO, READ BACK BY AND VERIFIED WITH:     M.T. AT 1905 ON 05/21/13 BY S.VANHOORNE    Anti-infectives   Start     Dose/Rate Route Frequency Ordered Stop   05/22/13 0300  ciprofloxacin (CIPRO) IVPB 400 mg     400 mg 200 mL/hr over 60 Minutes  Intravenous Every 18 hours 05/22/13 0245     05/21/13 1600  vancomycin (VANCOCIN) IVPB 1000 mg/200 mL premix     1,000 mg 200 mL/hr over 60 Minutes Intravenous Every 24 hours 05/21/13 1507     05/21/13 1600  piperacillin-tazobactam (ZOSYN) IVPB 3.375 g     3.375 g 12.5 mL/hr over 240 Minutes Intravenous Every 8 hours 05/21/13 1507        Assessment: Non-responsive paraplegic on ventilation with serious gram neg infection, complicated by non-oliguric renal failure.  Goal of Therapy:  For CrCl 10-72ml/min, Cipro regimen is 400mg  IV q18h for serious infection.  If renal failure <34ml/min, consider decreasing to 400mg  q24h.   Plan:  1.  No change for Cipro dose. 2.  Since renal failure is persistant and probably CrCl <64ml/min, will change Zosyn regimen to 3.375gm IV q12h 3.  No change in vancomycin regimen, but MD should consider need based on culture results  Averil Digman, Nada Boozer 05/23/2013,11:30 PM
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
## 2013-05-23 NOTE — Progress Notes (Signed)
Attempted SBT with patient on CPAP5/ Pressure support 5 and 40%. No patient effort was observed.

## 2013-05-23 NOTE — Progress Notes (Signed)
Subjective: She remains intubated on the ventilator. She is less responsive this morning but is coming off of pressors. She had worsened renal function  Objective: Vital signs in last 24 hours: Temp:  [98.1 F (36.7 C)-99.2 F (37.3 C)] 99.1 F (37.3 C) (12/28 0800) Pulse Rate:  [33-218] 126 (12/28 1030) Resp:  [10-24] 24 (12/28 1030) BP: (86-165)/(51-93) 159/93 mmHg (12/28 1030) SpO2:  [80 %-100 %] 99 % (12/28 1030) FiO2 (%):  [40 %] 40 % (12/28 0833) Weight:  [89.8 kg (197 lb 15.6 oz)] 89.8 kg (197 lb 15.6 oz) (12/28 0500) Weight change: -14.527 kg (-32 lb 0.4 oz) Last BM Date:  (unknown)  Intake/Output from previous day: 12/27 0701 - 12/28 0700 In: 3272.5 [I.V.:2722.5; IV Piggyback:550] Out: 2700 [Urine:2700]  PHYSICAL EXAM General appearance: Intubated and sedated Resp: rhonchi bilaterally Cardio: regular rate and rhythm, S1, S2 normal, no murmur, click, rub or gallop GI: soft, non-tender; bowel sounds normal; no masses,  no organomegaly Extremities: extremities normal, atraumatic, no cyanosis or edema  Lab Results:    Basic Metabolic Panel:  Recent Labs  05/22/13 0504 05/23/13 0526  NA 137 136  K 3.5 3.8  CL 106 106  CO2 15* 16*  GLUCOSE 127* 136*  BUN 24* 23  CREATININE 2.27* 2.80*  CALCIUM 6.8* 7.4*   Liver Function Tests:  Recent Labs  05/21/13 1145  AST 28  ALT 18  ALKPHOS 151*  BILITOT 0.5  PROT 6.6  ALBUMIN 2.2*    Recent Labs  05/21/13 1820  LIPASE 16    Recent Labs  05/21/13 1146 05/21/13 1808  AMMONIA 72* 28   CBC:  Recent Labs  05/21/13 1145  05/22/13 0504 05/23/13 0526  WBC 18.3*  < > 18.6* 16.5*  NEUTROABS 16.0*  --   --   --   HGB 11.2*  < > 10.8* 9.7*  HCT 33.7*  < > 32.6* 29.8*  MCV 92.3  < > 89.3 89.0  PLT 186  < > 245 211  < > = values in this interval not displayed. Cardiac Enzymes:  Recent Labs  05/21/13 1145 05/21/13 1820  CKTOTAL  --  185*  TROPONINI <0.30  --    BNP:  Recent Labs   05/21/13 1145  PROBNP 1928.0*   D-Dimer: No results found for this basename: DDIMER,  in the last 72 hours CBG: No results found for this basename: GLUCAP,  in the last 72 hours Hemoglobin A1C: No results found for this basename: HGBA1C,  in the last 72 hours Fasting Lipid Panel: No results found for this basename: CHOL, HDL, LDLCALC, TRIG, CHOLHDL, LDLDIRECT,  in the last 72 hours Thyroid Function Tests:  Recent Labs  05/21/13 1145  TSH 4.492   Anemia Panel: No results found for this basename: VITAMINB12, FOLATE, FERRITIN, TIBC, IRON, RETICCTPCT,  in the last 72 hours Coagulation:  Recent Labs  05/21/13 1145  LABPROT 16.2*  INR 1.33   Urine Drug Screen: Drugs of Abuse     Component Value Date/Time   LABOPIA NONE DETECTED 05/21/2013 [place] 05/21/2013 1358   LABBENZ NONE DETECTED 05/21/2013 1358   AMPHETMU NONE DETECTED 05/21/2013 1358   THCU NONE DETECTED 05/21/2013 1358   LABBARB NONE DETECTED 05/21/2013 1358    Alcohol Level:  Recent Labs  05/21/13 1145  ETH <11   Urinalysis:  Recent Labs  05/21/13 1358  COLORURINE YELLOW  LABSPEC 1.015  PHURINE 7.0  GLUCOSEU NEGATIVE  HGBUR LARGE*  BILIRUBINUR NEGATIVE  KETONESUR NEGATIVE  PROTEINUR 30*  UROBILINOGEN 0.2  NITRITE POSITIVE*  LEUKOCYTESUR LARGE*   Misc. Labs:  ABGS  Recent Labs  05/23/13 1015  PHART 7.283*  PO2ART 134.0*  TCO2 16.0  HCO3 16.6*   CULTURES Recent Results (from the past 240 hour(s))  CULTURE, BLOOD (ROUTINE X 2)     Status: None   Collection Time    05/21/13 11:46 AM      Result Value Range Status   Specimen Description RIGHT ANTECUBITAL   Final   Special Requests BOTTLES DRAWN AEROBIC AND ANAEROBIC 10CC EACH   Final   Culture  Setup Time     Final   Value: 05/22/2013 21:42     Performed at [facility]   Culture     Final   Value: [place]     Performed at [facility]   Report Status PENDING   Incomplete   CULTURE, BLOOD (ROUTINE X 2)     Status: None   Collection Time    05/21/13 11:51 AM      Result Value Range Status   Specimen Description RIGHT ANTECUBITAL   Final   Special Requests BOTTLES DRAWN AEROBIC AND ANAEROBIC 12CC EACH   Final   Culture  Setup Time     Final   Value: 05/22/2013 21:29     Performed at [facility]   Culture     Final   Value: GRAM NEGATIVE RODS     Note: Gram Stain Report Called to,Read Back By and Verified With: R.WAGONER AT 2243 ON 05/21/13 BY S.VANHOORNE Performed at [facility]     Performed at [facility]   Report Status PENDING   Incomplete  MRSA PCR SCREENING     Status: Abnormal   Collection Time    05/21/13  5:00 PM      Result Value Range Status   MRSA by PCR POSITIVE (*) NEGATIVE Final   Comment:            The GeneXpert MRSA Assay (FDA     approved for NASAL specimens     only), is one component of a     comprehensive MRSA colonization     surveillance program. It is not     intended to diagnose MRSA     infection nor to guide or     monitor treatment for     MRSA infections.     RESULT CALLED TO, READ BACK BY AND VERIFIED WITH:     M.T. AT 1905 ON 05/21/13 BY S.VANHOORNE   Studies/Results: Dg Abd 1 View  05/21/2013   CLINICAL DATA:  Abdominal distention.  EXAM: ABDOMEN - 1 VIEW  COMPARISON:  May 21, 2013 CT scan of the abdomen and pelvis  FINDINGS: The study is quite limited due to scatter effects secondary to the patient's body habitus. The bowel gas pattern rib visualized does not appear abnormal. There may be distention of the urinary bladder but this is not a definite finding. There is a phlebolith in the right aspect of the pelvis. There is a nasogastric tube present whose tip likely lies below the level of the GE junction but the proximal port may lie above it. A chest x-ray would be useful to help determine this further.  IMPRESSION: The study is quite limited due to the patient's body habitus. I cannot  exclude distention of the urinary bladder but the findings are not definite. Please see the note above regarding  positioning of the nasogastric tube.   Electronically Signed   By: David  Martinique   On: 05/21/2013 19:11   Ct Head Wo Contrast  05/21/2013   CLINICAL DATA:  Unresponsive.  EXAM: CT HEAD WITHOUT CONTRAST  TECHNIQUE: Contiguous axial images were obtained from the base of the skull through the vertex without intravenous contrast.  COMPARISON:  None.  FINDINGS: No mass. No hydrocephalus. No hemorrhage. Lucency noted in the left frontal white matter and cortex most likely from prior stroke. To exclude a significant underlying lesion such as a tumor MRI the brain is suggested. No acute bony abnormality identified. Air-fluid level in the left maxillary and sphenoid sinus. Sinusitis could present in this fashion. NG tube is noted. Soft tissue swelling in the nasopharynx.  IMPRESSION: 1. Left frontal white matter and cortical lucency most likely from prior stroke, to exclude underlying lesion including tumor MRI the brain can be obtained. 2. Air-fluid level left maxillary and sphenoid sinus. These findings would be consistent with sinusitis. NG tube is noted. Poor aeration of the nasopharynx with soft tissue swelling is noted.   Electronically Signed   By: Marcello Moores  Register   On: 05/21/2013 13:19   US Renal  05/22/2013   CLINICAL DATA:  Rule out stenosis  EXAM: RENAL/URINARY TRACT ULTRASOUND COMPLETE  COMPARISON:  CT 06/21/2012  FINDINGS: Right Kidney:  Length: 10.8 cm in length. The right kidney is markedly irregular. The entire upper pole is heterogeneous. There are hyperechoic and hypoechoic areas. Central mass cannot be excluded. No hydronephrosis.  Left Kidney:  Length: 13.6 cm in length. No hydronephrosis or mass. Irregular upper pole contour.  Bladder:  Foley catheter decompresses the bladder.  IMPRESSION: No hydronephrosis.  The upper pole of the right kidney is markedly irregular underlying central  mass cannot be excluded. CT or MR can be performed to further characterize.   Electronically Signed   By: Maryclare Bean M.D.   On: 05/22/2013 14:13   Dg Chest Port 1 View  05/22/2013   CLINICAL DATA:  Hypoxia  EXAM: PORTABLE CHEST - 1 VIEW  COMPARISON:  May 21, 2013  FINDINGS: Endotracheal tube tip is 4.6 cm above the carina. Nasogastric tube tip and side port are in the stomach. Central catheter tip is in the superior vena cava. There is no demonstrable pneumothorax.  There is focal consolidation in the left mid lung. There is atelectasis in the right base, however. Heart is mildly prominent with normal pulmonary vascularity. No adenopathy. Postoperative change is noted in the thoracic spine.  IMPRESSION: The tube and catheter positions as described. No pneumothorax. Focal infiltrate left mid lung. Patchy atelectasis right base.   Electronically Signed   By: Lowella Grip M.D.   On: 05/22/2013 08:33   Dg Chest Port 1 View  05/21/2013   CLINICAL DATA:  New right central venous catheter placement  EXAM: PORTABLE CHEST - 1 VIEW  COMPARISON:  05/21/2013  FINDINGS: Thoracic fusion hardware noted. Nasogastric tube tip terminates below the level of the hemidiaphragms but is not included in the field of view. Tip of the endotracheal tube is obscured by hardware but appears to terminate above the level of the carina. Right subclavian approach central line tip is obscured by hardware but terminates at least at the level of the proximal SVC. No pneumothorax. Left-sided retrocardiac airspace opacity persists. Chronic right subclavian deformity reidentified.  IMPRESSION: Right subclavian approach central line tip is at least in the level of the proximal SVC but is obscured by  overlying thoracic spinal hardware. Consider oblique view for possible improved visualization.   Electronically Signed   By: Conchita Paris M.D.   On: 05/21/2013 19:10   Dg Chest Portable 1 View  05/21/2013   CLINICAL DATA:  Post  intubation  EXAM: PORTABLE CHEST - 1 VIEW  COMPARISON:  Portable exam 1201 hr compared to 06/25/2012  FINDINGS: Tip of endotracheal tube at or very near the carina and right mainstem bronchus origin, carina poorly localized.  Nasogastric tube extends into abdomen.  Orthopedic hardware in the thoracic spine limits assessment of the endotracheal tube and carina.  Numerous EKG leads project over chest.  Enlargement of cardiac silhouette with pulmonary vascular congestion.  Question mild basilar atelectasis versus infiltrate.  Upper lungs clear.  No gross pneumothorax.  Old mid right clavicular fracture.  IMPRESSION: Chronic is poorly localized but the tip of the endotracheal tube is likely at or very near the carina and origin of the right mainstem bronchus; consider withdrawal 1-2 cm.  Enlargement of cardiac silhouette with pulmonary vascular congestion.  Probable mild bibasilar atelectasis versus infiltrate.  Findings called to Dr. Betsey Holiday on 05/21/2013 at 1218 hr.   Electronically Signed   By: Lavonia Dana M.D.   On: 05/21/2013 12:18    Medications:  Prior to Admission:  Prescriptions prior to admission  Medication Sig Dispense Refill  . baclofen (LIORESAL) 20 MG tablet Take 40 mg by mouth 4 (four) times daily.       . dantrolene (DANTRIUM) 25 MG capsule Take 25 mg by mouth daily.       . diazepam (VALIUM) 5 MG tablet Take 5 mg by mouth every 12 (twelve) hours as needed. For anxiety      . docusate sodium (COLACE) 100 MG capsule Take 100 mg by mouth at bedtime.      . fesoterodine (TOVIAZ) 4 MG TB24 Take 1 tablet (4 mg total) by mouth daily.  30 tablet  0  . gabapentin (NEURONTIN) 300 MG capsule Take 300 mg by mouth 2 (two) times daily.       Marland Kitchen HYDROcodone-acetaminophen (NORCO/VICODIN) 5-325 MG per tablet Take 1 tablet by mouth Every 4 hours as needed. Pain.      . lidocaine (LIDODERM) 5 % Place 1 patch onto the skin daily. Remove & Discard patch within 12 hours or as directed by MD      . Multiple  Vitamin (MULTIVITAMIN) tablet Take 1 tablet by mouth daily.      . naproxen (NAPROSYN) 500 MG tablet Take 500 mg by mouth 2 (two) times daily with a meal.         Scheduled: . albuterol  2.5 mg Nebulization Q6H   And  . ipratropium  0.5 mg Nebulization Q6H  . antiseptic oral rinse  15 mL Mouth Rinse QID  . chlorhexidine  15 mL Mouth Rinse BID  . Chlorhexidine Gluconate Cloth  6 each Topical Q0600  . ciprofloxacin  400 mg Intravenous Q18H  . enoxaparin (LOVENOX) injection  40 mg Subcutaneous Q24H  . mupirocin ointment  1 application Nasal BID  . pantoprazole (PROTONIX) IV  40 mg Intravenous QHS  . piperacillin-tazobactam (ZOSYN)  IV  3.375 g Intravenous Q8H  . sodium chloride  10-40 mL Intracatheter Q12H  . vancomycin  1,000 mg Intravenous Q24H   Continuous: . dextrose 5 % and 0.9% NaCl 75 mL/hr at 05/23/13 1000  . DOPamine 2 mcg/kg/min (05/23/13 1000)  . norepinephrine (LEVOPHED) Adult infusion 8 mcg/min (05/23/13 1013)  [id number] chloride, fentaNYL, fentaNYL, sodium chloride  Assesment: She is admitted with pyelonephritis probably aspiration pneumonia acute encephalopathy and sepsis. She has acute renal failure because of that. At baseline she has paraplegia. Her family was concerned that she had a terminal prognosis. I told him that I don't think that is the situation that everything that she has could potentially improve particularly since this is an acute illness related to infection. She has gram-negative rods in her urine and blood. She does have acute kidney injury but I explained to them that that did not necessarily mean that she would end up with chronic renal failure and dialysis. She has aspiration pneumonia which should be adequately treated with current medications. I explained to her sister that although I can make no guarantees that I think we should give her a chance to get better before we contemplate discontinuation of life support Active Problems:   Paraplegia    Pyelonephritis, acute   Respiratory failure, acute   Sepsis   Encephalopathy acute   Acute renal failure   Hyperkalemia   Hypothermia    Plan: Continue current treatments    LOS: 2 days   Ettore Trebilcock L 05/23/2013, 10:33 AM

## 2013-05-24 ENCOUNTER — Inpatient Hospital Stay (HOSPITAL_COMMUNITY): Payer: Medicaid Other

## 2013-05-24 ENCOUNTER — Inpatient Hospital Stay (HOSPITAL_COMMUNITY)
Admit: 2013-05-24 | Discharge: 2013-05-24 | Disposition: A | Discharge status: Home / Self Care | Payer: Medicaid Other | Attending: Neurology | Admitting: Neurology

## 2013-05-24 LAB — BLOOD GAS, ARTERIAL
Acid-base deficit: 7.7 mmol/L — ABNORMAL HIGH (ref 0.0–2.0)
Bicarbonate: 16.5 mEq/L — ABNORMAL LOW (ref 20.0–24.0)
Drawn by: 317771
FIO2: 0.4 %
O2 Saturation: 98.6 %
PEEP: 0 cmH2O
Patient temperature: 38
RATE: 12 resp/min
pCO2 arterial: 28.9 mmHg — ABNORMAL LOW (ref 35.0–45.0)
pO2, Arterial: 128 mmHg — ABNORMAL HIGH (ref 80.0–100.0)

## 2013-05-24 LAB — CBC WITH DIFFERENTIAL/PLATELET
Basophils Absolute: 0.1 10*3/uL (ref 0.0–0.1)
Lymphocytes Relative: 11 % — ABNORMAL LOW (ref 12–46)
Lymphs Abs: 1.2 10*3/uL (ref 0.7–4.0)
MCH: 29.5 pg (ref 26.0–34.0)
Neutro Abs: 9.2 10*3/uL — ABNORMAL HIGH (ref 1.7–7.7)
Neutrophils Relative %: 83 % — ABNORMAL HIGH (ref 43–77)
Platelets: 177 10*3/uL (ref 150–400)
RBC: 2.98 MIL/uL — ABNORMAL LOW (ref 3.87–5.11)
RDW: 15.3 % (ref 11.5–15.5)
WBC: 11.1 10*3/uL — ABNORMAL HIGH (ref 4.0–10.5)

## 2013-05-24 LAB — COMPREHENSIVE METABOLIC PANEL
ALT: 13 U/L (ref 0–35)
AST: 13 U/L (ref 0–37)
Albumin: 1.8 g/dL — ABNORMAL LOW (ref 3.5–5.2)
Alkaline Phosphatase: 121 U/L — ABNORMAL HIGH (ref 39–117)
BUN: 23 mg/dL (ref 6–23)
Chloride: 111 mEq/L (ref 96–112)
Creatinine, Ser: 3.17 mg/dL — ABNORMAL HIGH (ref 0.50–1.10)
GFR calc Af Amer: 19 mL/min — ABNORMAL LOW (ref >90)
Glucose, Bld: 147 mg/dL — ABNORMAL HIGH (ref 70–99)
Potassium: 3.5 mEq/L (ref 3.5–5.1)
Total Bilirubin: 0.5 mg/dL (ref 0.3–1.2)

## 2013-05-24 LAB — CULTURE, BLOOD (ROUTINE X 2)

## 2013-05-24 LAB — TSH: TSH: 1.807 u[IU]/mL (ref 0.350–4.500)

## 2013-05-24 LAB — VITAMIN B12: Vitamin B-12: 859 pg/mL (ref 211–911)

## 2013-05-24 MED ORDER — IPRATROPIUM-ALBUTEROL 0.5-2.5 (3) MG/3ML IN SOLN
3.0000 mL | Freq: Four times a day (QID) | RESPIRATORY_TRACT | Status: DC
  Administered 2013-05-24 – 2013-05-28 (×16): 3 mL via RESPIRATORY_TRACT
  Filled 2013-05-24 (×18): qty 3

## 2013-05-24 MED ORDER — ENOXAPARIN SODIUM 30 MG/0.3ML ~~LOC~~ SOLN
30.0000 mg | SUBCUTANEOUS | Status: DC
  Administered 2013-05-24 – 2013-05-29 (×6): 30 mg via SUBCUTANEOUS
  Filled 2013-05-24 (×6): qty 0.3

## 2013-05-24 NOTE — Progress Notes (Signed)
Subjective: She is much more alert this morning. She is still on 2 pressors with the dopamine I believe was added for renal function. She's on low-dose levo fed but her blood pressures in the 140s so I think that can be tapered and probably discontinue. Her blood gas is better. Chest x-ray I think looks a little better.  Objective: Vital signs in last 24 hours: Temp:  [99.4 F (37.4 C)-100.4 F (38 C)] 100.1 F (37.8 C) (12/29 0447) Pulse Rate:  [31-132] 32 (12/29 0645) Resp:  [12-24] 17 (12/29 0645) BP: (91-164)/(51-95) 138/73 mmHg (12/29 0645) SpO2:  [78 %-100 %] 85 % (12/29 0645) FiO2 (%):  [40 %] 40 % (12/29 0740) Weight:  [91.5 kg (201 lb 11.5 oz)] 91.5 kg (201 lb 11.5 oz) (12/29 0447) Weight change: 1.7 kg (3 lb 12 oz) Last BM Date:  (unknown)  Intake/Output from previous day: 12/28 0701 - 12/29 0700 In: 2596.8 [I.V.:2046.8; IV Piggyback:550] Out: 3750 [Urine:3750]  PHYSICAL EXAM General appearance: alert, moderate distress and Intubated she is undergoing wakeup assessment now Resp: rhonchi bilaterally Cardio: regular rate and rhythm, S1, S2 normal, no murmur, click, rub or gallop GI: soft, non-tender; bowel sounds normal; no masses,  no organomegaly Extremities: extremities normal, atraumatic, no cyanosis or edema  Lab Results:    Basic Metabolic Panel:  Recent Labs  05/23/13 0526 05/24/13 0452  NA 136 142  K 3.8 3.5  CL 106 111  CO2 16* 18*  GLUCOSE 136* 147*  BUN 23 23  CREATININE 2.80* 3.17*  CALCIUM 7.4* 7.7*   Liver Function Tests:  Recent Labs  05/21/13 1145 05/24/13 0452  AST 28 13  ALT 18 13  ALKPHOS 151* 121*  BILITOT 0.5 0.5  PROT 6.6 6.2  ALBUMIN 2.2* 1.8*    Recent Labs  05/21/13 1820  LIPASE 16    Recent Labs  05/21/13 1146 05/21/13 1808  AMMONIA 72* 28   CBC:  Recent Labs  05/21/13 1145  05/23/13 0526 05/24/13 0452  WBC 18.3*  < > 16.5* 11.1*  NEUTROABS 16.0*  --   --  9.2*  HGB 11.2*  < > 9.7* 8.8*  HCT 33.7*   < > 29.8* 26.2*  MCV 92.3  < > 89.0 87.9  PLT 186  < > 211 177  < > = values in this interval not displayed. Cardiac Enzymes:  Recent Labs  05/21/13 1145 05/21/13 1820  CKTOTAL  --  185*  TROPONINI <0.30  --    BNP:  Recent Labs  05/21/13 1145  PROBNP 1928.0*   D-Dimer: No results found for this basename: DDIMER,  in the last 72 hours CBG: No results found for this basename: GLUCAP,  in the last 72 hours Hemoglobin A1C: No results found for this basename: HGBA1C,  in the last 72 hours Fasting Lipid Panel: No results found for this basename: CHOL, HDL, LDLCALC, TRIG, CHOLHDL, LDLDIRECT,  in the last 72 hours Thyroid Function Tests:  Recent Labs  05/21/13 1145  TSH 4.492   Anemia Panel: No results found for this basename: VITAMINB12, FOLATE, FERRITIN, TIBC, IRON, RETICCTPCT,  in the last 72 hours Coagulation:  Recent Labs  05/21/13 1145  LABPROT 16.2*  INR 1.33   Urine Drug Screen: Drugs of Abuse     Component Value Date/Time   LABOPIA NONE DETECTED 05/21/2013 [place] 05/21/2013 1358   LABBENZ NONE DETECTED 05/21/2013 1358   AMPHETMU NONE DETECTED 05/21/2013 [place] DETECTED 05/21/2013  [place] 05/21/2013 1358    Alcohol Level:  Recent Labs  05/21/13 1145  ETH <11   Urinalysis:  Recent Labs  05/21/13 1358  COLORURINE YELLOW  LABSPEC 1.015  PHURINE 7.0  GLUCOSEU NEGATIVE  HGBUR LARGE*  BILIRUBINUR NEGATIVE  KETONESUR NEGATIVE  PROTEINUR 30*  UROBILINOGEN 0.2  NITRITE POSITIVE*  LEUKOCYTESUR LARGE*   Misc. Labs:  ABGS  Recent Labs  05/24/13 0500  PHART 7.375  PO2ART 128.0*  TCO2 14.4  HCO3 16.5*   CULTURES Recent Results (from the past 240 hour(s))  CULTURE, BLOOD (ROUTINE X 2)     Status: None   Collection Time    05/21/13 11:46 AM      Result Value Range Status   Specimen Description RIGHT ANTECUBITAL   Final   Special Requests BOTTLES DRAWN AEROBIC AND ANAEROBIC  10CC EACH   Final   Culture  Setup Time     Final   Value: 05/22/2013 21:42     Performed at [facility]   Culture     Final   Value: KLEBSIELLA PNEUMONIAE     Note: SUSCEPTIBILITIES PERFORMED ON PREVIOUS CULTURE WITHIN THE LAST 5 DAYS.     Note: Gram Stain Report Called to,Read Back By and Verified With: [id number] RN AT 2339 ON 05/21/13 BY S.VANHOORNE Performed at [facility]     Performed at [facility]   Report Status 05/24/2013 FINAL   Final  CULTURE, BLOOD (ROUTINE X 2)     Status: None   Collection Time    05/21/13 11:51 AM      Result Value Range Status   Specimen Description RIGHT ANTECUBITAL   Final   Special Requests BOTTLES DRAWN AEROBIC AND ANAEROBIC 12CC EACH   Final   Culture  Setup Time     Final   Value: 05/22/2013 21:29     Performed at [facility]   Culture     Final   Value: KLEBSIELLA PNEUMONIAE     Note: Gram Stain Report Called to,Read Back By and Verified With: R.WAGONER AT 2243 ON 05/21/13 BY S.VANHOORNE Performed at [facility]     Performed at [facility]   Report Status 05/24/2013 FINAL   Final   Organism ID, Bacteria KLEBSIELLA PNEUMONIAE   Final  URINE CULTURE     Status: None   Collection Time    05/21/13  1:58 PM      Result Value Range Status   Specimen Description URINE, CATHETERIZED   Final   Special Requests NONE   Final   Culture  Setup Time     Final   Value: 05/22/2013 19:13     Performed at [place]     Final   Value: >=100,000 COLONIES/ML     Performed at [facility]   Culture     Final   Value: Multiple bacterial morphotypes present, none predominant. Suggest appropriate recollection if clinically indicated.     Performed at [facility]   Report Status 05/23/2013 FINAL   Final  MRSA PCR SCREENING     Status: Abnormal   Collection Time    05/21/13  5:00 PM      Result Value Range Status   MRSA by PCR POSITIVE (*) NEGATIVE Final    Comment:            The GeneXpert MRSA Assay (FDA     approved for NASAL  specimens     only), is one component of a     comprehensive MRSA colonization     surveillance program. It is not     intended to diagnose MRSA     infection nor to guide or     monitor treatment for     MRSA infections.     RESULT CALLED TO, READ BACK BY AND VERIFIED WITH:     M.T. AT 1905 ON 05/21/13 BY S.VANHOORNE   Studies/Results: US Renal  05/22/2013   CLINICAL DATA:  Rule out stenosis  EXAM: RENAL/URINARY TRACT ULTRASOUND COMPLETE  COMPARISON:  CT 06/21/2012  FINDINGS: Right Kidney:  Length: 10.8 cm in length. The right kidney is markedly irregular. The entire upper pole is heterogeneous. There are hyperechoic and hypoechoic areas. Central mass cannot be excluded. No hydronephrosis.  Left Kidney:  Length: 13.6 cm in length. No hydronephrosis or mass. Irregular upper pole contour.  Bladder:  Foley catheter decompresses the bladder.  IMPRESSION: No hydronephrosis.  The upper pole of the right kidney is markedly irregular underlying central mass cannot be excluded. CT or MR can be performed to further characterize.   Electronically Signed   By: Maryclare Bean M.D.   On: 05/22/2013 14:13   Dg Chest Port 1 View  05/24/2013   CLINICAL DATA:  Followup evaluation.  Respiratory failure.  EXAM: PORTABLE CHEST - 1 VIEW  COMPARISON:  Chest x-ray 05/23/2013.  FINDINGS: Patient is intubated, but the tip of the endotracheal tube is obscured by overlying spinal hardware. The tip of the tube is estimated to be at the level of the thoracic inlet. A nasogastric tube is seen extending into the stomach, however, the tip of the nasogastric tube extends below the lower margin of the image. There is a right-sided subclavian central venous catheter with tip terminating in the mid superior vena cava. Patchy opacities throughout the mid to lower lungs bilaterally (left greater than right), which may reflect areas of atelectasis and/or  consolidation. Blunting of the left costophrenic sulcus, indicative of a small to moderate left pleural effusion. Cephalization of the pulmonary vasculature. Heart size is borderline enlarged. Mediastinal contours are distorted by patient positioning. 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 Orthopedic fixation hardware throughout the thoracic spine.  IMPRESSION: 1. Support apparatus and postoperative changes, as above. 2. Patchy interstitial and airspace opacities throughout the mid to lower lungs bilaterally, concerning for areas of atelectasis and/or consolidation, most severe in the left lower lobe. 3. Small to moderate left pleural effusion. 4. Pulmonary venous congestion.   Electronically Signed   By: Vinnie Langton M.D.   On: 05/24/2013 07:54   Dg Chest Port 1 View  05/23/2013   CLINICAL DATA:  Respiratory failure, paraplegia  EXAM: PORTABLE CHEST - 1 VIEW  COMPARISON:  Portable exam 1025 hr compared to 05/22/2013  FINDINGS: Nasogastric tube extends into stomach.  Tip of endotracheal tube is obscured by spinal hardware but is above the carina.  Bilateral spinal fixation rods and laminar hooks are present.  Enlargement of cardiac silhouette.  Subclavian central venous catheter tip projects over SVC.  Decreased right basilar atelectasis.  Increased atelectasis versus consolidation in left lower lobe.  No pneumothorax.  IMPRESSION: Improved right basilar atelectasis with increased atelectasis versus consolidation in left lower lobe.   Electronically Signed   By: Lavonia Dana M.D.   On: 05/23/2013 10:51    Medications:  Prior to Admission:  Prescriptions prior to admission  Medication Sig Dispense Refill  .  baclofen (LIORESAL) 20 MG tablet Take 40 mg by mouth 4 (four) times daily.       . dantrolene (DANTRIUM) 25 MG capsule Take 25 mg by mouth daily.       . diazepam (VALIUM) 5 MG tablet Take 5 mg by mouth every 12 (twelve) hours as needed. For anxiety      . docusate  sodium (COLACE) 100 MG capsule Take 100 mg by mouth at bedtime.      . fesoterodine (TOVIAZ) 4 MG TB24 Take 1 tablet (4 mg total) by mouth daily.  30 tablet  0  . gabapentin (NEURONTIN) 300 MG capsule Take 300 mg by mouth 2 (two) times daily.       Marland Kitchen HYDROcodone-acetaminophen (NORCO/VICODIN) 5-325 MG per tablet Take 1 tablet by mouth Every 4 hours as needed. Pain.      . lidocaine (LIDODERM) 5 % Place 1 patch onto the skin daily. Remove & Discard patch within 12 hours or as directed by MD      . Multiple Vitamin (MULTIVITAMIN) tablet Take 1 tablet by mouth daily.      . naproxen (NAPROSYN) 500 MG tablet Take 500 mg by mouth 2 (two) times daily with a meal.         Scheduled: . albuterol  2.5 mg Nebulization Q6H   And  . ipratropium  0.5 mg Nebulization Q6H  . antiseptic oral rinse  15 mL Mouth Rinse QID  . chlorhexidine  15 mL Mouth Rinse BID  . Chlorhexidine Gluconate Cloth  6 each Topical Q0600  . ciprofloxacin  400 mg Intravenous Q18H  . enoxaparin (LOVENOX) injection  40 mg Subcutaneous Q24H  . mupirocin ointment  1 application Nasal BID  . pantoprazole (PROTONIX) IV  40 mg Intravenous QHS  . piperacillin-tazobactam (ZOSYN)  IV  3.375 g Intravenous Q8H  . piperacillin-tazobactam (ZOSYN)  IV  3.375 g Intravenous Q12H  . sodium chloride  10-40 mL Intracatheter Q12H  . vancomycin  1,000 mg Intravenous Q24H   Continuous: . dextrose 5 % and 0.9% NaCl 75 mL/hr at 05/24/13 0600  . DOPamine 2 mcg/kg/min (05/24/13 0600)  . norepinephrine (LEVOPHED) Adult infusion 3 mcg/min (05/24/13 0745)   [id number] chloride, fentaNYL, fentaNYL, sodium chloride  Assesment: She was admitted with pyelonephritis.  Blood cultures  Are growing Klebsiella. and urine culture has greater than 100,000 but it has not been identified yet. Assuming that this is also Klebsiella we can reduce the amount of antibiotics. Her encephalopathy appears to be better. She still has respiratory failure but I think things are  getting better with that. Her renal function is a little bit worse.  Active Problems:   Paraplegia   Pyelonephritis, acute   Respiratory failure, acute   Sepsis   Encephalopathy acute   Acute renal failure   Hyperkalemia   Hypothermia    Plan:Continue current treatments. We may be able to reduce the number of antibiotics. Continue other medicines. Neuro consult for encephalopathy she may be able to come off the ventilator soon     LOS: 3 days   Crist Kruszka L 05/24/2013, 8:02 AM

## 2013-05-24 NOTE — Progress Notes (Signed)
Subjective: Patient is awake and responding to verbal communications. She is oral intubated on vent. She is still on inotropic support.  Objective: Vital signs in last 24 hours: Temp:  [99.4 F (37.4 C)-100.4 F (38 C)] 100.1 F (37.8 C) (12/29 0447) Pulse Rate:  [31-132] 32 (12/29 0645) Resp:  [12-24] 17 (12/29 0645) BP: (91-164)/(51-95) 138/73 mmHg (12/29 0645) SpO2:  [78 %-100 %] 85 % (12/29 0645) FiO2 (%):  [40 %] 40 % (12/29 0740) Weight:  [91.5 kg (201 lb 11.5 oz)] 91.5 kg (201 lb 11.5 oz) (12/29 0447) Weight change: 1.7 kg (3 lb 12 oz) Last BM Date:  (unknown)  Intake/Output from previous day: 12/28 0701 - 12/29 0700 In: 2596.8 [I.V.:2046.8; IV Piggyback:550] Out: 3750 [Urine:3750]  PHYSICAL EXAM General appearance: moderate distress and slowed mentation Resp: diminished breath sounds bilaterally and rhonchi bilaterally Cardio: S1, S2 normal GI: soft, non-tender; bowel sounds normal; no masses,  no organomegaly Extremities: paraplegic and deformed lower extremities  Lab Results:    @labtest @ [facility]  Recent Labs  05/24/13 0500  PHART 7.375  PO2ART 128.0*  TCO2 14.4  HCO3 16.5*   CULTURES Recent Results (from the past 240 hour(s))  CULTURE, BLOOD (ROUTINE X 2)     Status: None   Collection Time    05/21/13 11:46 AM      Result Value Range Status   Specimen Description RIGHT ANTECUBITAL   Final   Special Requests BOTTLES DRAWN AEROBIC AND ANAEROBIC 10CC EACH   Final   Culture  Setup Time     Final   Value: 05/22/2013 21:42     Performed at [facility]   Culture     Final   Value: KLEBSIELLA PNEUMONIAE     Note: SUSCEPTIBILITIES PERFORMED ON PREVIOUS CULTURE WITHIN THE LAST 5 DAYS.     Note: Gram Stain Report Called to,Read Back By and Verified With: [id number] RN AT 2339 ON 05/21/13 BY S.VANHOORNE Performed at [facility]     Performed at [facility]   Report Status 05/24/2013 FINAL   Final  CULTURE, BLOOD (ROUTINE X 2)      Status: None   Collection Time    05/21/13 11:51 AM      Result Value Range Status   Specimen Description RIGHT ANTECUBITAL   Final   Special Requests BOTTLES DRAWN AEROBIC AND ANAEROBIC 12CC [facility]   Final   Culture  Setup Time     Final   Value: 05/22/2013 21:29     Performed at [facility]   Culture     Final   Value: KLEBSIELLA PNEUMONIAE     Note: Gram Stain Report Called to,Read Back By and Verified With: R.WAGONER AT 2243 ON 05/21/13 BY S.VANHOORNE Performed at [facility]     Performed at [facility]   Report Status 05/24/2013 FINAL   Final   Organism ID, Bacteria KLEBSIELLA PNEUMONIAE   Final  URINE CULTURE     Status: None   Collection Time    05/21/13  1:58 PM      Result Value Range Status   Specimen Description URINE, CATHETERIZED   Final   Special Requests NONE   Final   Culture  Setup Time     Final   Value: 05/22/2013 19:13     Performed at [place]     Final   Value: >=100,000 COLONIES/ML     Performed at [facility]  Culture     Final   Value: Multiple bacterial morphotypes present, none predominant. Suggest appropriate recollection if clinically indicated.     Performed at [facility]   Report Status 05/23/2013 FINAL   Final  MRSA PCR SCREENING     Status: Abnormal   Collection Time    05/21/13  5:00 PM      Result Value Range Status   MRSA by PCR POSITIVE (*) NEGATIVE Final   Comment:            The GeneXpert MRSA Assay (FDA     approved for NASAL specimens     only), is one component of a     comprehensive MRSA colonization     surveillance program. It is not     intended to diagnose MRSA     infection nor to guide or     monitor treatment for     MRSA infections.     RESULT CALLED TO, READ BACK BY AND VERIFIED WITH:     M.T. AT 1905 ON 05/21/13 BY S.VANHOORNE   Studies/Results: US Renal  05/22/2013   CLINICAL DATA:  Rule out stenosis  EXAM: RENAL/URINARY TRACT ULTRASOUND  COMPLETE  COMPARISON:  CT 06/21/2012  FINDINGS: Right Kidney:  Length: 10.8 cm in length. The right kidney is markedly irregular. The entire upper pole is heterogeneous. There are hyperechoic and hypoechoic areas. Central mass cannot be excluded. No hydronephrosis.  Left Kidney:  Length: 13.6 cm in length. No hydronephrosis or mass. Irregular upper pole contour.  Bladder:  Foley catheter decompresses the bladder.  IMPRESSION: No hydronephrosis.  The upper pole of the right kidney is markedly irregular underlying central mass cannot be excluded. CT or MR can be performed to further characterize.   Electronically Signed   By: Maryclare Bean M.D.   On: 05/22/2013 14:13   Dg Chest Port 1 View  05/24/2013   CLINICAL DATA:  Followup evaluation.  Respiratory failure.  EXAM: PORTABLE CHEST - 1 VIEW  COMPARISON:  Chest x-ray 05/23/2013.  FINDINGS: Patient is intubated, but the tip of the endotracheal tube is obscured by overlying spinal hardware. The tip of the tube is estimated to be at the level of the thoracic inlet. A nasogastric tube is seen extending into the stomach, however, the tip of the nasogastric tube extends below the lower margin of the image. There is a right-sided subclavian central venous catheter with tip terminating in the mid superior vena cava. Patchy opacities throughout the mid to lower lungs bilaterally (left greater than right), which may reflect areas of atelectasis and/or consolidation. Blunting of the left costophrenic sulcus, indicative of a small to moderate left pleural effusion. Cephalization of the pulmonary vasculature. Heart size is borderline enlarged. Mediastinal contours are distorted by patient positioning. 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 Orthopedic fixation hardware throughout the thoracic spine.  IMPRESSION: 1. Support apparatus and postoperative changes, as above. 2. Patchy interstitial and airspace opacities throughout the mid to lower lungs  bilaterally, concerning for areas of atelectasis and/or consolidation, most severe in the left lower lobe. 3. Small to moderate left pleural effusion. 4. Pulmonary venous congestion.   Electronically Signed   By: Vinnie Langton M.D.   On: 05/24/2013 07:54   Dg Chest Port 1 View  05/23/2013   CLINICAL DATA:  Respiratory failure, paraplegia  EXAM: PORTABLE CHEST - 1 VIEW  COMPARISON:  Portable exam 1025 hr compared to 05/22/2013  FINDINGS: Nasogastric tube extends into stomach.  Tip of endotracheal tube is obscured by spinal hardware but is above the carina.  Bilateral spinal fixation rods and laminar hooks are present.  Enlargement of cardiac silhouette.  Subclavian central venous catheter tip projects over SVC.  Decreased right basilar atelectasis.  Increased atelectasis versus consolidation in left lower lobe.  No pneumothorax.  IMPRESSION: Improved right basilar atelectasis with increased atelectasis versus consolidation in left lower lobe.   Electronically Signed   By: Lavonia Dana M.D.   On: 05/23/2013 10:51    Medications: I have reviewed the patient's current medications.  Assesment: Active Problems:   Paraplegia   Pyelonephritis, acute   Respiratory failure, acute   Sepsis   Encephalopathy acute   Acute renal failure   Hyperkalemia   Hypothermia    Plan: Medications reviewed Continue ventilatory support Continue Iv antibiotics Continue inotropic support.     LOS: 3 days   Lochlann Mastrangelo 05/24/2013, 8:04 AM

## 2013-05-24 NOTE — Progress Notes (Signed)
EEG Completed; Results Pending  
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
## 2013-05-24 NOTE — Progress Notes (Signed)
NUTRITION FOLLOW UP  Intervention:   If unable to wean as anticipated:  Recommend continuous Oxepa @ 20 ml/hr via OGT, which will provide 720 kcals, 30 grams protein, and 340 ml fluid. Add 30 ml Prostat 5 times daily (which will provide additional 500 kcals and 75 grams protein daily).   Nutrition regimen will provide 1200 kcals and 105 grams protein daily, which meets 100% of estimated calorie and protein needs.  IVF's-D5NS@75  ml/hr-provides 306 kcal dextrose q 24 hrs.  Nutrition Dx:   Inadequate oral intake related to inability to eat as evidenced by  NPO and vent status  Goal:   Enteral nutrition to provide 60-70% of estimated calorie needs (22-25 kcals/kg ideal body weight) and 100% of estimated protein needs, based on ASPEN guidelines for permissive underfeeding in critically ill obese individuals.  Monitor:   Nutrition support measures, labs, I/O's, weights and changes in respiratory status  Assessment:   Patient remains intubated on ventilator support. Respiratory status improving and pt may wean soon per MD. AKI secondary to sepsis. Pt also positive blood cultures (gram negative rods). Pressor support being decreased. Re-estimated nutrition needs due to severe wt change since initial assessment. MV: 9.2  L/min Temp (24hrs), Avg:99.9 F (37.7 C), Min:99.4 F (37.4 C), Max:100.4 F (38 C)  Propofol: no sedation   Height: Ht Readings from Last 1 Encounters:  05/24/13 5' 1.81" (1.57 m)    Weight Status:   Wt Readings from Last 1 Encounters:  05/24/13 201 lb 11.5 oz (91.5 kg)  Adjusted IBW-102# (46.3 kg)  Re-estimated needs:  Kcal: 1821 [id number] kcal) ASPEN guidelines Protein: 93-115 gr Fluid: >1500 ml daily  Skin: unstageable to left ankle, scant drainage  Diet Order: NPO   Intake/Output Summary (Last 24 hours) at 05/24/13 1253 Last data filed at 05/24/13 1200  Gross per 24 hour  Intake 2826.34 ml  Output   3900 ml  Net -1073.66 ml    Last BM: PTA  (abdomen distended)   Labs:   Recent Labs Lab 05/22/13 0504 05/23/13 0526 05/24/13 0452  NA 137 136 142  K 3.5 3.8 3.5  CL 106 106 111  CO2 15* 16* 18*  BUN 24* 23 23  CREATININE 2.27* 2.80* 3.17*  CALCIUM 6.8* 7.4* 7.7*  GLUCOSE 127* 136* 147*    CBG (last 3)  No results found for this basename: GLUCAP,  in the last 72 hours  Scheduled Meds: . albuterol  2.5 mg Nebulization Q6H   And  . ipratropium  0.5 mg Nebulization Q6H  . antiseptic oral rinse  15 mL Mouth Rinse QID  . chlorhexidine  15 mL Mouth Rinse BID  . Chlorhexidine Gluconate Cloth  6 each Topical Q0600  . ciprofloxacin  400 mg Intravenous Q18H  . enoxaparin (LOVENOX) injection  30 mg Subcutaneous Q24H  . mupirocin ointment  1 application Nasal BID  . pantoprazole (PROTONIX) IV  40 mg Intravenous QHS  . piperacillin-tazobactam (ZOSYN)  IV  3.375 g Intravenous Q8H  . sodium chloride  10-40 mL Intracatheter Q12H  . vancomycin  1,000 mg Intravenous Q24H    Continuous Infusions: . dextrose 5 % and 0.9% NaCl 75 mL/hr at 05/24/13 0800  . DOPamine 2 mcg/kg/min (05/24/13 0800)  . norepinephrine (LEVOPHED) Adult infusion Stopped (05/24/13 1100)    Colman Cater MS,RD,CSG,LDN Office: [phone] Pager: [phone]

## 2013-05-24 NOTE — Progress Notes (Signed)
Pt's pressor were turned down and RT performed the SBT.5/5 40%. The Pt's Vt were very low  When RT wasn't stimulating pt. RT increased the PS to 10 and the Pt still had low Vt. The VT would be in normal range and then would go down to 114 and 39. The pt is more alert but at times she falls in a deep sleep . The pt isnt on any sedation
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
## 2013-05-24 NOTE — Progress Notes (Signed)
UR chart review completed.  

## 2013-05-25 ENCOUNTER — Inpatient Hospital Stay (HOSPITAL_COMMUNITY): Payer: Medicaid Other

## 2013-05-25 LAB — BLOOD GAS, ARTERIAL
Acid-base deficit: 8.1 mmol/L — ABNORMAL HIGH (ref 0.0–2.0)
Bicarbonate: 16.9 mEq/L — ABNORMAL LOW (ref 20.0–24.0)
FIO2: 0.4 %
O2 Saturation: 98.7 %
PEEP: 0 cmH2O
TCO2: 14.6 mmol/L (ref 0–100)
pCO2 arterial: 34 mmHg — ABNORMAL LOW (ref 35.0–45.0)
pO2, Arterial: 157 mmHg — ABNORMAL HIGH (ref 80.0–100.0)

## 2013-05-25 LAB — CBC WITH DIFFERENTIAL/PLATELET
Basophils Relative: 0 % (ref 0–1)
Eosinophils Absolute: 0.3 10*3/uL (ref 0.0–0.7)
HCT: 24.8 % — ABNORMAL LOW (ref 36.0–46.0)
Hemoglobin: 8.1 g/dL — ABNORMAL LOW (ref 12.0–15.0)
Lymphs Abs: 1.5 10*3/uL (ref 0.7–4.0)
MCH: 29.1 pg (ref 26.0–34.0)
Monocytes Absolute: 0.6 10*3/uL (ref 0.1–1.0)
Monocytes Relative: 7 % (ref 3–12)
Neutro Abs: 5.7 10*3/uL (ref 1.7–7.7)
Neutrophils Relative %: 71 % (ref 43–77)
RBC: 2.78 MIL/uL — ABNORMAL LOW (ref 3.87–5.11)

## 2013-05-25 LAB — COMPREHENSIVE METABOLIC PANEL
ALT: 11 U/L (ref 0–35)
Alkaline Phosphatase: 103 U/L (ref 39–117)
BUN: 23 mg/dL (ref 6–23)
Chloride: 112 mEq/L (ref 96–112)
Creatinine, Ser: 3.13 mg/dL — ABNORMAL HIGH (ref 0.50–1.10)
GFR calc Af Amer: 20 mL/min — ABNORMAL LOW (ref >90)
GFR calc non Af Amer: 17 mL/min — ABNORMAL LOW (ref >90)
Glucose, Bld: 114 mg/dL — ABNORMAL HIGH (ref 70–99)
Potassium: 3.4 mEq/L — ABNORMAL LOW (ref 3.7–5.3)
Total Protein: 6 g/dL (ref 6.0–8.3)

## 2013-05-25 LAB — VITAMIN B12: Vitamin B-12: 764 pg/mL (ref 211–911)

## 2013-05-25 LAB — FOLATE: Folate: 14.9 ng/mL

## 2013-05-25 LAB — RETICULOCYTES
RBC.: 2.94 MIL/uL — ABNORMAL LOW (ref 3.87–5.11)
Retic Ct Pct: 1.1 % (ref 0.4–3.1)

## 2013-05-25 LAB — IRON AND TIBC: TIBC: 155 ug/dL — ABNORMAL LOW (ref 250–470)

## 2013-05-25 NOTE — Procedures (Signed)
Extubation Procedure Note  Patient Details:   Name: [name] DOB: 11-26-1965 MRN: [id number]   Airway Documentation:  Airway 8 mm (Active)  Secured at (cm) 19 cm 05/25/2013  7:38 AM  Measured From Teeth 05/25/2013  7:38 AM  Secured Location Right 05/25/2013  7:38 AM  Secured By [employer] 05/25/2013  7:38 AM  Tube Holder Repositioned Yes 05/25/2013  7:38 AM  Cuff Pressure (cm H2O) 26 cm H2O 05/25/2013  3:29 AM  Site Condition Dry 05/25/2013  7:38 AM   NIF -40 VC 1.8 liters Pt tolerated extubation well, pt now on 40% venturi mask.  Will continue to monitor.  Evaluation  O2 sats: stable throughout Complications: No apparent complications Patient did tolerate procedure well. Bilateral Breath Sounds: Diminished Suctioning: Airway Yes  Oren Bracket 05/25/2013, 8:50 AM
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
## 2013-05-25 NOTE — Progress Notes (Signed)
Patient ID: [name], female   DOB: [birth year], 47 y.o.   MRN: [id number]  [place] A. Merlene Laughter, MD     [email or web address]          [name] is an 47 y.o. female.   Assessment/Plan: 1. Improving toxic metabolic encephalopathy. Continue with the current care and 2. 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 This time we'll observe the patient and not treat with seizure medications given the infrequent discharges. The encephalomalacia is likely related to trauma or old infarct.  The patient refers status has improved. She has just been extubated.  GENERAL: Status with extubation in no acute distress. She is on a Ventimask.  HEENT: Supple. Atraumatic normocephalic.   ABDOMEN: soft  EXTREMITIES: 1-2+ edema of the extremities.  BACK: Normal.  SKIN: Normal by inspection.    MENTAL STATUS:   CRANIAL NERVES: Pupils are equal, round and reactive to light; extra ocular movements are full, there is no significant nystagmus; upper and lower facial muscles are normal in strength and symmetric, there is no flattening of the nasolabial folds.  MOTOR: She has antigravity strength of the upper extremities. Leg 0/5.  COORDINATION: There are no dysmetria or tremors.    Objective: Vital signs in last 24 hours: Temp:  [98.5 F (36.9 C)-99.6 F (37.6 C)] 98.5 F (36.9 C) (12/30 0730) Pulse Rate:  [25-129] 82 (12/30 0645) Resp:  [12-20] 19 (12/30 0645) BP: (79-154)/(50-93) 145/91 mmHg (12/30 0630) SpO2:  [83 %-100 %] 90 % (12/30 0645) FiO2 (%):  [40 %] 40 % (12/30 0800) Weight:  [86.2 kg (190 lb 0.6 oz)] 86.2 kg (190 lb 0.6 oz) (12/30 0738)  Intake/Output from previous day: 12/29 0701 - 12/30 0700 In: 2276.8 [I.V.:1926.8; IV Piggyback:350] Out: 1600 [Urine:1600] Intake/Output this shift:   Nutritional status: NPO   Lab Results: Results for orders placed during the hospital  encounter of 05/21/13 (from the past 48 hour(s))  CREATININE, URINE, RANDOM     Status: None   Collection Time    05/23/13  4:15 PM      Result Value Range   Creatinine, Urine 14.96    SODIUM, URINE, RANDOM     Status: None   Collection Time    05/23/13  4:15 PM      Result Value Range   Sodium, Ur 90    COMPREHENSIVE METABOLIC PANEL     Status: Abnormal   Collection Time    05/24/13  4:52 AM      Result Value Range   Sodium 142  135 - 145 mEq/L   Potassium 3.5  3.5 - 5.1 mEq/L   Chloride 111  96 - 112 mEq/L   CO2 18 (*) 19 - 32 mEq/L   Glucose, Bld 147 (*) 70 - 99 mg/dL   BUN 23  6 - 23 mg/dL   Creatinine, Ser 3.17 (*) 0.50 - 1.10 mg/dL   Calcium 7.7 (*) 8.4 - 10.5 mg/dL   Total Protein 6.2  6.0 - 8.3 g/dL   Albumin 1.8 (*) 3.5 - 5.2 g/dL   AST 13  0 - 37 U/L   ALT 13  0 - 35 U/L   Alkaline Phosphatase 121 (*) 39 - 117 U/L   Total Bilirubin 0.5  0.3 - 1.2 mg/dL   GFR calc non Af Amer 17 (*) >90 mL/min   GFR calc Af Amer 19 (*) >90 mL/min   Comment: (NOTE)  The eGFR has been calculated using the CKD EPI equation.     This calculation has not been validated in all clinical situations.     eGFR's persistently <90 mL/min signify possible Chronic Kidney     Disease.  CBC WITH DIFFERENTIAL     Status: Abnormal   Collection Time    05/24/13  4:52 AM      Result Value Range   WBC 11.1 (*) 4.0 - 10.5 K/uL   RBC 2.98 (*) 3.87 - 5.11 MIL/uL   Hemoglobin 8.8 (*) 12.0 - 15.0 g/dL   HCT 26.2 (*) 36.0 - 46.0 %   MCV 87.9  78.0 - 100.0 fL   MCH 29.5  26.0 - 34.0 pg   MCHC 33.6  30.0 - 36.0 g/dL   RDW 15.3  11.5 - 15.5 %   Platelets 177  150 - 400 K/uL   Neutrophils Relative % 83 (*) 43 - 77 %   Neutro Abs 9.2 (*) 1.7 - 7.7 K/uL   Lymphocytes Relative 11 (*) 12 - 46 %   Lymphs Abs 1.2  0.7 - 4.0 K/uL   Monocytes Relative 5  3 - 12 %   Monocytes Absolute 0.6  0.1 - 1.0 K/uL   Eosinophils Relative 1  0 - 5 %   Eosinophils Absolute 0.1  0.0 - 0.7 K/uL   Basophils Relative 1   0 - 1 %   Basophils Absolute 0.1  0.0 - 0.1 K/uL  BLOOD GAS, ARTERIAL     Status: Abnormal   Collection Time    05/24/13  5:00 AM      Result Value Range   FIO2 0.40     Delivery systems VENTILATOR     Mode PRESSURE REGULATED VOLUME CONTROL     VT 500     Rate 12.0     Peep/cpap 0.0     pH, Arterial 7.375  7.350 - 7.450   pCO2 arterial 28.9 (*) 35.0 - 45.0 mmHg   pO2, Arterial 128.0 (*) 80.0 - 100.0 mmHg   Bicarbonate 16.5 (*) 20.0 - 24.0 mEq/L   TCO2 14.4  0 - 100 mmol/L   Acid-base deficit 7.7 (*) 0.0 - 2.0 mmol/L   O2 Saturation 98.6     Patient temperature 38.0     Collection site RIGHT RADIAL     Drawn by [id number]     Sample type ARTERIAL DRAW     Allens test (pass/fail) PASS  PASS  VITAMIN B12     Status: None   Collection Time    05/24/13 10:29 AM      Result Value Range   Vitamin B-12 859  211 - 911 pg/mL   Comment: Performed at [facility]  TSH     Status: None   Collection Time    05/24/13 10:29 AM      Result Value Range   TSH 1.807  0.350 - 4.500 uIU/mL   Comment: Performed at [facility]  CBC WITH DIFFERENTIAL     Status: Abnormal   Collection Time    05/25/13  4:26 AM      Result Value Range   WBC 8.0  4.0 - 10.5 K/uL   RBC 2.78 (*) 3.87 - 5.11 MIL/uL   Hemoglobin 8.1 (*) 12.0 - 15.0 g/dL   HCT 24.8 (*) 36.0 - 46.0 %   MCV 89.2  78.0 - 100.0 fL   MCH 29.1  26.0 - 34.0 pg   MCHC 32.7  30.0 - 36.0 g/dL   RDW 15.8 (*) 11.5 - 15.5 %   Platelets 159  150 - 400 K/uL   Neutrophils Relative % 71  43 - 77 %   Neutro Abs 5.7  1.7 - 7.7 K/uL   Lymphocytes Relative 18  12 - 46 %   Lymphs Abs 1.5  0.7 - 4.0 K/uL   Monocytes Relative 7  3 - 12 %   Monocytes Absolute 0.6  0.1 - 1.0 K/uL   Eosinophils Relative 4  0 - 5 %   Eosinophils Absolute 0.3  0.0 - 0.7 K/uL   Basophils Relative 0  0 - 1 %   Basophils Absolute 0.0  0.0 - 0.1 K/uL  COMPREHENSIVE METABOLIC PANEL     Status: Abnormal   Collection Time    05/25/13  4:26 AM      Result Value  Range   Sodium 143  137 - 147 mEq/L   Potassium 3.4 (*) 3.7 - 5.3 mEq/L   Chloride 112  96 - 112 mEq/L   CO2 17 (*) 19 - 32 mEq/L   Glucose, Bld 114 (*) 70 - 99 mg/dL   BUN 23  6 - 23 mg/dL   Creatinine, Ser 3.13 (*) 0.50 - 1.10 mg/dL   Calcium 7.6 (*) 8.4 - 10.5 mg/dL   Total Protein 6.0  6.0 - 8.3 g/dL   Albumin 1.8 (*) 3.5 - 5.2 g/dL   AST 9  0 - 37 U/L   ALT 11  0 - 35 U/L   Alkaline Phosphatase 103  39 - 117 U/L   Total Bilirubin 0.5  0.3 - 1.2 mg/dL   GFR calc non Af Amer 17 (*) >90 mL/min   GFR calc Af Amer 20 (*) >90 mL/min   Comment: (NOTE)     The eGFR has been calculated using the CKD EPI equation.     This calculation has not been validated in all clinical situations.     eGFR's persistently <90 mL/min signify possible Chronic Kidney     Disease.  BLOOD GAS, ARTERIAL     Status: Abnormal   Collection Time    05/25/13  5:00 AM      Result Value Range   FIO2 0.40     Delivery systems VENTILATOR     Mode PRESSURE REGULATED VOLUME CONTROL     VT 500     Rate 12.0     Peep/cpap 0     pH, Arterial 7.317 (*) 7.350 - 7.450   pCO2 arterial 34.0 (*) 35.0 - 45.0 mmHg   pO2, Arterial 157.0 (*) 80.0 - 100.0 mmHg   Bicarbonate 16.9 (*) 20.0 - 24.0 mEq/L   TCO2 14.6  0 - 100 mmol/L   Acid-base deficit 8.1 (*) 0.0 - 2.0 mmol/L   O2 Saturation 98.7     Patient temperature 37.0     Collection site RIGHT RADIAL     Drawn by [id number]     Sample type ARTERIAL DRAW     Allens test (pass/fail) PASS  PASS  RETICULOCYTES     Status: Abnormal   Collection Time    05/25/13 10:06 AM      Result Value Range   Retic Ct Pct 1.1  0.4 - 3.1 %   RBC. 2.94 (*) 3.87 - 5.11 MIL/uL   Retic Count, Manual 32.3  19.0 - 186.0 K/uL    Lipid Panel No results found for this basename: CHOL, TRIG, HDL, CHOLHDL, VLDL, LDLCALC,  in the last 72 hours  Studies/Results: Dg Chest Port 1 View  05/25/2013   CLINICAL DATA:  Ventilator dependent respiratory failure.  EXAM: PORTABLE CHEST - 1 VIEW   COMPARISON:  05/24/2013  FINDINGS: Thoracic posterior spinal fixation rods are again seen, and position of the endotracheal tube is not well visualized on this study. Right subclavian central venous catheter and nasogastric tube remain in appropriate position.  Cardiomegaly stable. Mild worsening of infiltrate or atelectasis seen in the right perihilar region right lung base. Left lower lobe opacity shows no significant change.  IMPRESSION: Mild worsening of infiltrate or atelectasis in right perihilar region and right lung base. Stable left lower lobe opacity. Position of endotracheal tube not well visualized on this study.   Electronically Signed   By: Earle Gell M.D.   On: 05/25/2013 08:45   Dg Chest Port 1 View  05/24/2013   CLINICAL DATA:  Followup evaluation.  Respiratory failure.  EXAM: PORTABLE CHEST - 1 VIEW  COMPARISON:  Chest x-ray 05/23/2013.  FINDINGS: Patient is intubated, but the tip of the endotracheal tube is obscured by overlying spinal hardware. The tip of the tube is estimated to be at the level of the thoracic inlet. A nasogastric tube is seen extending into the stomach, however, the tip of the nasogastric tube extends below the lower margin of the image. There is a right-sided subclavian central venous catheter with tip terminating in the mid superior vena cava. Patchy opacities throughout the mid to lower lungs bilaterally (left greater than right), which may reflect areas of atelectasis and/or consolidation. Blunting of the left costophrenic sulcus, indicative of a small to moderate left pleural effusion. Cephalization of the pulmonary vasculature. Heart size is borderline enlarged. Mediastinal contours are distorted by patient positioning. 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 Orthopedic fixation hardware throughout the thoracic spine.  IMPRESSION: 1. Support apparatus and postoperative changes, as above. 2. Patchy interstitial and airspace opacities  throughout the mid to lower lungs bilaterally, concerning for areas of atelectasis and/or consolidation, most severe in the left lower lobe. 3. Small to moderate left pleural effusion. 4. Pulmonary venous congestion.   Electronically Signed   By: Vinnie Langton M.D.   On: 05/24/2013 07:54   Dg Chest Port 1 View  05/23/2013   CLINICAL DATA:  Respiratory failure, paraplegia  EXAM: PORTABLE CHEST - 1 VIEW  COMPARISON:  Portable exam 1025 hr compared to 05/22/2013  FINDINGS: Nasogastric tube extends into stomach.  Tip of endotracheal tube is obscured by spinal hardware but is above the carina.  Bilateral spinal fixation rods and laminar hooks are present.  Enlargement of cardiac silhouette.  Subclavian central venous catheter tip projects over SVC.  Decreased right basilar atelectasis.  Increased atelectasis versus consolidation in left lower lobe.  No pneumothorax.  IMPRESSION: Improved right basilar atelectasis with increased atelectasis versus consolidation in left lower lobe.   Electronically Signed   By: Lavonia Dana M.D.   On: 05/23/2013 10:51    Medications:  Scheduled Meds: . antiseptic oral rinse  15 mL Mouth Rinse QID  . chlorhexidine  15 mL Mouth Rinse BID  . Chlorhexidine Gluconate Cloth  6 each Topical Q0600  . enoxaparin (LOVENOX) injection  30 mg Subcutaneous Q24H  . ipratropium-albuterol  3 mL Nebulization Q6H  . mupirocin ointment  1 application Nasal BID  . pantoprazole (PROTONIX) IV  40 mg Intravenous QHS  . piperacillin-tazobactam (ZOSYN)  IV  3.375 g Intravenous Q8H  . sodium chloride  10-40 mL Intracatheter Q12H   Continuous Infusions: . dextrose 5 % and 0.9% NaCl 75 mL/hr at 05/25/13 0700  . DOPamine Stopped (05/24/13 1330)  . norepinephrine (LEVOPHED) Adult infusion Stopped (05/24/13 1100)   PRN Meds:.sodium chloride, fentaNYL, fentaNYL, sodium chloride     LOS: 4 days   Saivion Goettel A. Merlene Laughter, M.D.  Diplomate, Tax adviser of Psychiatry and Neurology (  Neurology).

## 2013-05-25 NOTE — Clinical Social Work Placement (Signed)
    Clinical Social Work Department CLINICAL SOCIAL WORK PLACEMENT NOTE 05/25/2013  Patient:  [name], [name]  Account Number:  [id number] Admit date:  05/21/2013  Clinical Social Worker:  Edwyna Shell, CLINICAL SOCIAL WORKER  Date/time:  05/25/2013 12:00 N  Clinical Social Work is seeking post-discharge placement for this patient at the following level of care:   [place]   (*CSW will update this form in Epic as items are completed)   05/24/2013  Patient/family provided with [place] Department of Clinical Social Work's list of facilities offering this level of care within the geographic area requested by the patient (or if unable, by the patient's family).  05/24/2013  Patient/family informed of their freedom to choose among providers that offer the needed level of care, that participate in Medicare, Medicaid or managed care program needed by the patient, have an available bed and are willing to accept the patient.  05/25/2013  Patient/family informed of [facility]' ownership interest in [facility], as well as of the fact that they are under no obligation to receive care at this facility.  PASARR submitted to EDS on 05/24/2013 PASARR number received from EDS on 05/24/2013  FL2 transmitted to all facilities in geographic area requested by pt/family on  05/25/2013 FL2 transmitted to all facilities within larger geographic area on   Patient informed that his/her managed care company has contracts with or will negotiate with  certain facilities, including the following:     Patient/family informed of bed offers received:   Patient chooses bed at  Physician recommends and patient chooses bed at    Patient to be transferred to  on   Patient to be transferred to facility by   The following physician request were entered in Epic:   Additional Comments:  Edwyna Shell, LCSW Clinical Social Worker [phone])

## 2013-05-25 NOTE — Progress Notes (Signed)
Subjective: Patient is more alert and awake. She is responding to verbal communication. No fever or chills. She is growing Klebsiella in her blood. Her Iv antibiotics is adjusted. Patient is planned for weaning  Objective: Vital signs in last 24 hours: Temp:  [98.9 F (37.2 C)-99.6 F (37.6 C)] 99.6 F (37.6 C) (12/30 0000) Pulse Rate:  [25-129] 82 (12/30 0645) Resp:  [12-20] 19 (12/30 0645) BP: (79-154)/(50-93) 145/91 mmHg (12/30 0630) SpO2:  [83 %-100 %] 90 % (12/30 0645) FiO2 (%):  [40 %] 40 % (12/30 0738) Weight:  [86.2 kg (190 lb 0.6 oz)] 86.2 kg (190 lb 0.6 oz) (12/30 0738) Weight change:  Last BM Date:  (unknown)  Intake/Output from previous day: 12/29 0701 - 12/30 0700 In: 2276.8 [I.V.:1926.8; IV Piggyback:350] Out: 1600 [Urine:1600]  PHYSICAL EXAM General appearance: moderate distress and slowed mentation Resp: diminished breath sounds bilaterally and rhonchi bilaterally Cardio: S1, S2 normal GI: soft, non-tender; bowel sounds normal; no masses,  no organomegaly Extremities: paraplegic and deformed lower extremities  Lab Results:    @labtest @ [facility]  Recent Labs  05/25/13 0500  PHART 7.317*  PO2ART 157.0*  TCO2 14.6  HCO3 16.9*   CULTURES Recent Results (from the past 240 hour(s))  CULTURE, BLOOD (ROUTINE X 2)     Status: None   Collection Time    05/21/13 11:46 AM      Result Value Range Status   Specimen Description RIGHT ANTECUBITAL   Final   Special Requests BOTTLES DRAWN AEROBIC AND ANAEROBIC 10CC EACH   Final   Culture  Setup Time     Final   Value: 05/22/2013 21:42     Performed at [facility]   Culture     Final   Value: KLEBSIELLA PNEUMONIAE     Note: SUSCEPTIBILITIES PERFORMED ON PREVIOUS CULTURE WITHIN THE LAST 5 DAYS.     Note: Gram Stain Report Called to,Read Back By and Verified With: [id number] RN AT 2339 ON 05/21/13 BY S.VANHOORNE Performed at [facility]     Performed at [facility]   Report Status 05/24/2013  FINAL   Final  CULTURE, BLOOD (ROUTINE X 2)     Status: None   Collection Time    05/21/13 11:51 AM      Result Value Range Status   Specimen Description RIGHT ANTECUBITAL   Final   Special Requests BOTTLES DRAWN AEROBIC AND ANAEROBIC 12CC [facility]   Final   Culture  Setup Time     Final   Value: 05/22/2013 21:29     Performed at [facility]   Culture     Final   Value: KLEBSIELLA PNEUMONIAE     Note: Gram Stain Report Called to,Read Back By and Verified With: R.WAGONER AT 2243 ON 05/21/13 BY S.VANHOORNE Performed at [facility]     Performed at [facility]   Report Status 05/24/2013 FINAL   Final   Organism ID, Bacteria KLEBSIELLA PNEUMONIAE   Final  URINE CULTURE     Status: None   Collection Time    05/21/13  1:58 PM      Result Value Range Status   Specimen Description URINE, CATHETERIZED   Final   Special Requests NONE   Final   Culture  Setup Time     Final   Value: 05/22/2013 19:13     Performed at [place]     Final   Value: >=100,000 COLONIES/ML  Performed at [facility]     Final   Value: Multiple bacterial morphotypes present, none predominant. Suggest appropriate recollection if clinically indicated.     Performed at [facility]   Report Status 05/23/2013 FINAL   Final  MRSA PCR SCREENING     Status: Abnormal   Collection Time    05/21/13  5:00 PM      Result Value Range Status   MRSA by PCR POSITIVE (*) NEGATIVE Final   Comment:            The GeneXpert MRSA Assay (FDA     approved for NASAL specimens     only), is one component of a     comprehensive MRSA colonization     surveillance program. It is not     intended to diagnose MRSA     infection nor to guide or     monitor treatment for     MRSA infections.     RESULT CALLED TO, READ BACK BY AND VERIFIED WITH:     M.T. AT 1905 ON 05/21/13 BY S.VANHOORNE   Studies/Results: Dg Chest Port 1 View  05/24/2013   CLINICAL DATA:   Followup evaluation.  Respiratory failure.  EXAM: PORTABLE CHEST - 1 VIEW  COMPARISON:  Chest x-ray 05/23/2013.  FINDINGS: Patient is intubated, but the tip of the endotracheal tube is obscured by overlying spinal hardware. The tip of the tube is estimated to be at the level of the thoracic inlet. A nasogastric tube is seen extending into the stomach, however, the tip of the nasogastric tube extends below the lower margin of the image. There is a right-sided subclavian central venous catheter with tip terminating in the mid superior vena cava. Patchy opacities throughout the mid to lower lungs bilaterally (left greater than right), which may reflect areas of atelectasis and/or consolidation. Blunting of the left costophrenic sulcus, indicative of a small to moderate left pleural effusion. Cephalization of the pulmonary vasculature. Heart size is borderline enlarged. Mediastinal contours are distorted by patient positioning. 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 Orthopedic fixation hardware throughout the thoracic spine.  IMPRESSION: 1. Support apparatus and postoperative changes, as above. 2. Patchy interstitial and airspace opacities throughout the mid to lower lungs bilaterally, concerning for areas of atelectasis and/or consolidation, most severe in the left lower lobe. 3. Small to moderate left pleural effusion. 4. Pulmonary venous congestion.   Electronically Signed   By: Vinnie Langton M.D.   On: 05/24/2013 07:54   Dg Chest Port 1 View  05/23/2013   CLINICAL DATA:  Respiratory failure, paraplegia  EXAM: PORTABLE CHEST - 1 VIEW  COMPARISON:  Portable exam 1025 hr compared to 05/22/2013  FINDINGS: Nasogastric tube extends into stomach.  Tip of endotracheal tube is obscured by spinal hardware but is above the carina.  Bilateral spinal fixation rods and laminar hooks are present.  Enlargement of cardiac silhouette.  Subclavian central venous catheter tip projects over SVC.   Decreased right basilar atelectasis.  Increased atelectasis versus consolidation in left lower lobe.  No pneumothorax.  IMPRESSION: Improved right basilar atelectasis with increased atelectasis versus consolidation in left lower lobe.   Electronically Signed   By: Lavonia Dana M.D.   On: 05/23/2013 10:51    Medications: I have reviewed the patient's current medications.  Assesment: Active Problems:   Paraplegia   Pyelonephritis, acute   Respiratory failure, acute   Sepsis   Encephalopathy acute   Acute  renal failure   Hyperkalemia   Hypothermia    Plan: Medications reviewed weaning trial as per pulmonary recommendation Continue IV antibiotics    LOS: 4 days   Red Mandt 05/25/2013, 7:58 AM

## 2013-05-25 NOTE — Care Management Note (Addendum)
    Page 1 of 2   06/04/2013     3:21:32 PM   CARE MANAGEMENT NOTE 06/04/2013  Patient:  [name], [name]   Account Number:  [id number]  Date Initiated:  05/25/2013  Documentation initiated by:  Theophilus Kinds  Subjective/Objective Assessment:   Pt admitted from home with respiratory failure and UTI. According to pts sons, pt had been living alone but has a son and his girlfriend that will be living with pt at discharge. Pt has CAP aide 7 hours/M-F. Pt has lift chair,manual w/c and     Action/Plan:   roll in shower. Pt has been fairly independent with ADL's. Sons would like [place] RN and PT at discharge and hospital bed. Will continue to follow for discharge planning needs. Family would like [facility] for [facility] and DME.   Anticipated DC Date:  05/31/2013   Anticipated DC Plan:  [place] [facility]  CM consult      [facility] Choice  HOME HEALTH  DURABLE MEDICAL EQUIPMENT   Choice offered to / List presented to:  C-4 Adult Children   DME arranged  [place]      DME agency  [place] arranged  HH-1 RN  [place].   Status of service:  Completed, signed off Medicare Important Message given?   (If response is "NO", the following Medicare IM given date fields will be blank) Date Medicare IM given:   Date Additional Medicare IM given:    Discharge Disposition:  [place]  Per UR Regulation:    If discussed at Long Length of Stay Meetings, dates discussed:   06/03/2013    Comments:  06/03/13 Christina Cooper RN BSN CM Talked with son [name] who stated he and sister definitely want to take pt home. Plan as of now is to DC home with hospital bed and [facility] RN for catheter and nursing care. [facility] alerted that hospital bed can be delivered today. Gabriel's contact number given to [facility] with [facility] for delivery of bed.  06/01/13 Christina Fecher RN BSN CM Pt may be near  DC and unable to be placed until early Feb. due to new Medicaid rules. Family planning on taking pt home. Hospital bed in place. Light weight wheelchair ready for delivery. [place] RN if still needed.  05/28/13 Christina Castagna RN BN CM Spoke with pt regarding DME needs. [place] notified of need for hospital bed and pt requesting a light weight wheelchair. Pt also needs HH RN. Pt does not meet Medicaid guidelines for PT in home.  05/26/13 [place], RN BSN CM CM spoke to pts son [name] by phone to clarify that [facility] RN would be in the home periodically and would not provide care during the night (as pt was thinking). Pts son verbalized understanding as well as the pt. Pts son did state that someone would be with the pt at all times when CAP aide was not in the home.  05/25/13 [place], RN BSN CM
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
## 2013-05-26 ENCOUNTER — Inpatient Hospital Stay (HOSPITAL_COMMUNITY): Payer: Medicaid Other

## 2013-05-26 LAB — BLOOD GAS, ARTERIAL
Drawn by: 22223
O2 Content: 4 L/min
O2 Saturation: 98.1 %
Patient temperature: 37

## 2013-05-26 LAB — BASIC METABOLIC PANEL
BUN: 22 mg/dL (ref 6–23)
CO2: 17 mEq/L — ABNORMAL LOW (ref 19–32)
Chloride: 110 mEq/L (ref 96–112)
Creatinine, Ser: 2.88 mg/dL — ABNORMAL HIGH (ref 0.50–1.10)
GFR calc Af Amer: 22 mL/min — ABNORMAL LOW (ref >90)
Glucose, Bld: 99 mg/dL (ref 70–99)
Potassium: 3.1 mEq/L — ABNORMAL LOW (ref 3.7–5.3)

## 2013-05-26 LAB — CBC
HCT: 26.5 % — ABNORMAL LOW (ref 36.0–46.0)
MCH: 29.6 pg (ref 26.0–34.0)
MCHC: 32.5 g/dL (ref 30.0–36.0)
RDW: 16.1 % — ABNORMAL HIGH (ref 11.5–15.5)
WBC: 7.7 10*3/uL (ref 4.0–10.5)

## 2013-05-26 MED ORDER — FUROSEMIDE 10 MG/ML IJ SOLN
40.0000 mg | Freq: Two times a day (BID) | INTRAMUSCULAR | Status: DC
  Administered 2013-05-26 – 2013-05-28 (×6): 40 mg via INTRAVENOUS
  Filled 2013-05-26 (×6): qty 4

## 2013-05-26 MED ORDER — POTASSIUM CHLORIDE CRYS ER 20 MEQ PO TBCR
20.0000 meq | EXTENDED_RELEASE_TABLET | Freq: Two times a day (BID) | ORAL | Status: DC
  Administered 2013-05-26 (×2): 20 meq via ORAL
  Filled 2013-05-26 (×2): qty 1
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
## 2013-05-26 NOTE — Progress Notes (Signed)
Subjective: Patient is successfully extubated. She more alert and awake. She is started on clear liquad. No new complaint.  Objective: Vital signs in last 24 hours: Temp:  [98.5 F (36.9 C)] 98.5 F (36.9 C) (12/31 0600) Pulse Rate:  [71-107] 107 (12/31 0705) Resp:  [12-28] 19 (12/31 0705) BP: (117-144)/(67-107) 120/71 mmHg (12/31 0500) SpO2:  [91 %-100 %] 99 % (12/31 0705) FiO2 (%):  [40 %] 40 % (12/30 0800) Weight change:  Last BM Date: 05/26/13  Intake/Output from previous day: 12/30 0701 - 12/31 0700 In: 3195 [P.O.:1320; I.V.:1725; IV Piggyback:150] Out: 1300 [Urine:1300]  PHYSICAL EXAM General appearance: moderate distress and slowed mentation Resp: diminished breath sounds bilaterally and rhonchi bilaterally Cardio: S1, S2 normal GI: soft, non-tender; bowel sounds normal; no masses,  no organomegaly Extremities: paraplegic and deformed lower extremities  Lab Results:    @labtest @ [facility]  Recent Labs  05/26/13 0520  PHART 7.269*  PO2ART 118.0*  TCO2 16.3  HCO3 17.0*   CULTURES Recent Results (from the past 240 hour(s))  CULTURE, BLOOD (ROUTINE X 2)     Status: None   Collection Time    05/21/13 11:46 AM      Result Value Range Status   Specimen Description RIGHT ANTECUBITAL   Final   Special Requests BOTTLES DRAWN AEROBIC AND ANAEROBIC 10CC EACH   Final   Culture  Setup Time     Final   Value: 05/22/2013 21:42     Performed at [facility]   Culture     Final   Value: KLEBSIELLA PNEUMONIAE     Note: SUSCEPTIBILITIES PERFORMED ON PREVIOUS CULTURE WITHIN THE LAST 5 DAYS.     Note: Gram Stain Report Called to,Read Back By and Verified With: [id number] RN AT 2339 ON 05/21/13 BY S.VANHOORNE Performed at [facility]     Performed at [facility]   Report Status 05/24/2013 FINAL   Final  CULTURE, BLOOD (ROUTINE X 2)     Status: None   Collection Time    05/21/13 11:51 AM      Result Value Range Status   Specimen Description RIGHT  ANTECUBITAL   Final   Special Requests BOTTLES DRAWN AEROBIC AND ANAEROBIC 12CC EACH   Final   Culture  Setup Time     Final   Value: 05/22/2013 21:29     Performed at [facility]   Culture     Final   Value: KLEBSIELLA PNEUMONIAE     Note: Gram Stain Report Called to,Read Back By and Verified With: R.WAGONER AT 2243 ON 05/21/13 BY S.VANHOORNE Performed at [facility]     Performed at [facility]   Report Status 05/24/2013 FINAL   Final   Organism ID, Bacteria KLEBSIELLA PNEUMONIAE   Final  URINE CULTURE     Status: None   Collection Time    05/21/13  1:58 PM      Result Value Range Status   Specimen Description URINE, CATHETERIZED   Final   Special Requests NONE   Final   Culture  Setup Time     Final   Value: 05/22/2013 19:13     Performed at [place]     Final   Value: >=100,000 COLONIES/ML     Performed at [facility]   Culture     Final   Value: Multiple bacterial morphotypes present, none predominant. Suggest appropriate recollection if clinically indicated.     Performed  at [facility]   Report Status 05/23/2013 FINAL   Final  MRSA PCR SCREENING     Status: Abnormal   Collection Time    05/21/13  5:00 PM      Result Value Range Status   MRSA by PCR POSITIVE (*) NEGATIVE Final   Comment:            The GeneXpert MRSA Assay (FDA     approved for NASAL specimens     only), is one component of a     comprehensive MRSA colonization     surveillance program. It is not     intended to diagnose MRSA     infection nor to guide or     monitor treatment for     MRSA infections.     RESULT CALLED TO, READ BACK BY AND VERIFIED WITH:     M.T. AT 1905 ON 05/21/13 BY S.VANHOORNE   Studies/Results: Dg Chest Port 1 View  05/25/2013   CLINICAL DATA:  Ventilator dependent respiratory failure.  EXAM: PORTABLE CHEST - 1 VIEW  COMPARISON:  05/24/2013  FINDINGS: Thoracic posterior spinal fixation rods are again  seen, and position of the endotracheal tube is not well visualized on this study. Right subclavian central venous catheter and nasogastric tube remain in appropriate position.  Cardiomegaly stable. Mild worsening of infiltrate or atelectasis seen in the right perihilar region right lung base. Left lower lobe opacity shows no significant change.  IMPRESSION: Mild worsening of infiltrate or atelectasis in right perihilar region and right lung base. Stable left lower lobe opacity. Position of endotracheal tube not well visualized on this study.   Electronically Signed   By: Earle Gell M.D.   On: 05/25/2013 08:45    Medications: I have reviewed the patient's current medications.  Assesment: Active Problems:   Paraplegia   Pyelonephritis, acute   Respiratory failure, acute   Sepsis   Encephalopathy acute   Acute renal failure   Hyperkalemia   Hypothermia S/P extubation  Plan: Medications reviewed Continue IV antibiotics  Continue current treatment   LOS: 5 days   [name] 05/26/2013, 7:56 AM

## 2013-05-26 NOTE — Procedures (Signed)
Curtice A. Merlene Laughter, MD     [email or web address]         NAMEVANESA, [name]            ACCOUNT NO.:  [id number]  MEDICAL RECORD NO.:  [id number]  LOCATION:                                 FACILITY:  PHYSICIAN:  Oday Ridings A. Merlene Laughter, M.D. DATE OF BIRTH:  [birth year]  DATE OF PROCEDURE:  05/25/2013 DATE OF DISCHARGE:                             EEG INTERPRETATION   [name] is a 47 year old who presents with altered mental status and confusion.  The study is being done to evaluate for possible seizures.  MEDICATIONS:  Proventil, chlorhexidine, Cipro, Diprivan, fentanyl, Lovenox, Ativan, Levophed, Zosyn, vancomycin, Protonix.  ANALYSIS:  A 16-channel recording using a standard 10/20 measurement system is conducted for approximately 20 minutes.  There is a posterior rhythm that gets as high as 6.5 hertz.  There is sleep architecture observed with vertex sharp wave and some spindles observed.  The patient is noted to have two 2nd burst suppression pattern.  There is infrequent sharp wave activity that phase reverses at F3 and C3.  This occurs infrequently.  No clear electrographic seizures are observed.  IMPRESSION:  Abnormal recording due to the following. 1. Moderate generalized slowing. 2. Infrequent left frontal-central epileptiform discharges.     Makinna Andy A. Merlene Laughter, M.D.     KAD/MEDQ  D:  05/25/2013  T:  05/25/2013  Job:  [id number]

## 2013-05-26 NOTE — Progress Notes (Signed)
Subjective: She is able to be extubated yesterday and is doing okay. She has no new complaints. She has a lot of swelling from fluid resuscitation.  Objective: Vital signs in last 24 hours: Temp:  [98.5 F (36.9 C)] 98.5 F (36.9 C) (12/31 0600) Pulse Rate:  [71-107] 107 (12/31 0705) Resp:  [12-28] 19 (12/31 0705) BP: (117-144)/(67-107) 120/71 mmHg (12/31 0500) SpO2:  [91 %-100 %] 99 % (12/31 0705) Weight:  [91.3 kg (201 lb 4.5 oz)] 91.3 kg (201 lb 4.5 oz) (12/31 0759) Weight change:  Last BM Date: 05/26/13  Intake/Output from previous day: 12/30 0701 - 12/31 0700 In: 3195 [P.O.:1320; I.V.:1725; IV Piggyback:150] Out: 1300 [Urine:1300]  PHYSICAL EXAM General appearance: alert, cooperative and mild distress Resp: rhonchi bilaterally Cardio: regular rate and rhythm, S1, S2 normal, no murmur, click, rub or gallop GI: soft, non-tender; bowel sounds normal; no masses,  no organomegaly Extremities: Paraplegic she has some edema  Lab Results:    Basic Metabolic Panel:  Recent Labs  05/25/13 0426 05/26/13 0411  NA 143 144  K 3.4* 3.1*  CL 112 110  CO2 17* 17*  GLUCOSE 114* 99  BUN 23 22  CREATININE 3.13* 2.88*  CALCIUM 7.6* 7.4*   Liver Function Tests:  Recent Labs  05/24/13 0452 05/25/13 0426  AST 13 9  ALT 13 11  ALKPHOS 121* 103  BILITOT 0.5 0.5  PROT 6.2 6.0  ALBUMIN 1.8* 1.8*   No results found for this basename: LIPASE, AMYLASE,  in the last 72 hours No results found for this basename: AMMONIA,  in the last 72 hours CBC:  Recent Labs  05/24/13 0452 05/25/13 0426 05/26/13 0411  WBC 11.1* 8.0 7.7  NEUTROABS 9.2* 5.7  --   HGB 8.8* 8.1* 8.6*  HCT 26.2* 24.8* 26.5*  MCV 87.9 89.2 91.1  PLT 177 159 178   Cardiac Enzymes: No results found for this basename: CKTOTAL, CKMB, CKMBINDEX, TROPONINI,  in the last 72 hours BNP: No results found for this basename: PROBNP,  in the last 72 hours D-Dimer: No results found for this basename: DDIMER,  in the  last 72 hours CBG: No results found for this basename: GLUCAP,  in the last 72 hours Hemoglobin A1C: No results found for this basename: HGBA1C,  in the last 72 hours Fasting Lipid Panel: No results found for this basename: CHOL, HDL, LDLCALC, TRIG, CHOLHDL, LDLDIRECT,  in the last 72 hours Thyroid Function Tests:  Recent Labs  05/24/13 1029  TSH 1.807   Anemia Panel:  Recent Labs  05/25/13 1006  VITAMINB12 764  FOLATE 14.9  FERRITIN 159  TIBC 155*  IRON 28*  RETICCTPCT 1.1   Coagulation: No results found for this basename: LABPROT, INR,  in the last 72 hours Urine Drug Screen: Drugs of Abuse     Component Value Date/Time   LABOPIA NONE DETECTED 05/21/2013 1358   COCAINSCRNUR NONE DETECTED 05/21/2013 1358   LABBENZ NONE DETECTED 05/21/2013 1358   AMPHETMU NONE DETECTED 05/21/2013 1358   THCU NONE DETECTED 05/21/2013 1358   LABBARB NONE DETECTED 05/21/2013 1358    Alcohol Level: No results found for this basename: ETH,  in the last 72 hours Urinalysis: No results found for this basename: COLORURINE, APPERANCEUR, LABSPEC, PHURINE, GLUCOSEU, HGBUR, BILIRUBINUR, KETONESUR, PROTEINUR, UROBILINOGEN, NITRITE, LEUKOCYTESUR,  in the last 72 hours Misc. Labs:  ABGS  Recent Labs  05/26/13 0520  PHART 7.269*  PO2ART 118.0*  TCO2 16.3  HCO3 17.0*   CULTURES Recent Results (from  the past 240 hour(s))  CULTURE, BLOOD (ROUTINE X 2)     Status: None   Collection Time    05/21/13 11:46 AM      Result Value Range Status   Specimen Description RIGHT ANTECUBITAL   Final   Special Requests BOTTLES DRAWN AEROBIC AND ANAEROBIC 10CC EACH   Final   Culture  Setup Time     Final   Value: 05/22/2013 21:42     Performed at [facility]   Culture     Final   Value: KLEBSIELLA PNEUMONIAE     Note: SUSCEPTIBILITIES PERFORMED ON PREVIOUS CULTURE WITHIN THE LAST 5 DAYS.     Note: Gram Stain Report Called to,Read Back By and Verified With: [id number] RN AT 2339 ON 05/21/13 BY  S.VANHOORNE Performed at [facility]     Performed at [facility]   Report Status 05/24/2013 FINAL   Final  CULTURE, BLOOD (ROUTINE X 2)     Status: None   Collection Time    05/21/13 11:51 AM      Result Value Range Status   Specimen Description RIGHT ANTECUBITAL   Final   Special Requests BOTTLES DRAWN AEROBIC AND ANAEROBIC 12CC EACH   Final   Culture  Setup Time     Final   Value: 05/22/2013 21:29     Performed at [facility]   Culture     Final   Value: KLEBSIELLA PNEUMONIAE     Note: Gram Stain Report Called to,Read Back By and Verified With: R.WAGONER AT 2243 ON 05/21/13 BY S.VANHOORNE Performed at [facility]     Performed at [facility]   Report Status 05/24/2013 FINAL   Final   Organism ID, Bacteria KLEBSIELLA PNEUMONIAE   Final  URINE CULTURE     Status: None   Collection Time    05/21/13  1:58 PM      Result Value Range Status   Specimen Description URINE, CATHETERIZED   Final   Special Requests NONE   Final   Culture  Setup Time     Final   Value: 05/22/2013 19:13     Performed at [place]     Final   Value: >=100,000 COLONIES/ML     Performed at [facility]   Culture     Final   Value: Multiple bacterial morphotypes present, none predominant. Suggest appropriate recollection if clinically indicated.     Performed at [facility]   Report Status 05/23/2013 FINAL   Final  MRSA PCR SCREENING     Status: Abnormal   Collection Time    05/21/13  5:00 PM      Result Value Range Status   MRSA by PCR POSITIVE (*) NEGATIVE Final   Comment:            The GeneXpert MRSA Assay (FDA     approved for NASAL specimens     only), is one component of a     comprehensive MRSA colonization     surveillance program. It is not     intended to diagnose MRSA     infection nor to guide or     monitor treatment for     MRSA infections.     RESULT CALLED TO, READ BACK BY AND VERIFIED WITH:      M.T. AT 1905 ON 05/21/13 BY S.VANHOORNE   Studies/Results: Dg Chest Port 1 View  05/25/2013   CLINICAL  DATA:  Ventilator dependent respiratory failure.  EXAM: PORTABLE CHEST - 1 VIEW  COMPARISON:  05/24/2013  FINDINGS: Thoracic posterior spinal fixation rods are again seen, and position of the endotracheal tube is not well visualized on this study. Right subclavian central venous catheter and nasogastric tube remain in appropriate position.  Cardiomegaly stable. Mild worsening of infiltrate or atelectasis seen in the right perihilar region right lung base. Left lower lobe opacity shows no significant change.  IMPRESSION: Mild worsening of infiltrate or atelectasis in right perihilar region and right lung base. Stable left lower lobe opacity. Position of endotracheal tube not well visualized on this study.   Electronically Signed   By: Earle Gell M.D.   On: 05/25/2013 08:45    Medications:  Prior to Admission:  Prescriptions prior to admission  Medication Sig Dispense Refill  . baclofen (LIORESAL) 20 MG tablet Take 40 mg by mouth 4 (four) times daily.       . dantrolene (DANTRIUM) 25 MG capsule Take 25 mg by mouth daily.       . diazepam (VALIUM) 5 MG tablet Take 5 mg by mouth every 12 (twelve) hours as needed. For anxiety      . docusate sodium (COLACE) 100 MG capsule Take 100 mg by mouth at bedtime.      . fesoterodine (TOVIAZ) 4 MG TB24 Take 1 tablet (4 mg total) by mouth daily.  30 tablet  0  . gabapentin (NEURONTIN) 300 MG capsule Take 300 mg by mouth 2 (two) times daily.       Marland Kitchen HYDROcodone-acetaminophen (NORCO/VICODIN) 5-325 MG per tablet Take 1 tablet by mouth Every 4 hours as needed. Pain.      . lidocaine (LIDODERM) 5 % Place 1 patch onto the skin daily. Remove & Discard patch within 12 hours or as directed by MD      . Multiple Vitamin (MULTIVITAMIN) tablet Take 1 tablet by mouth daily.      . naproxen (NAPROSYN) 500 MG tablet Take 500 mg by mouth 2 (two) times daily with a meal.          Scheduled: . antiseptic oral rinse  15 mL Mouth Rinse QID  . chlorhexidine  15 mL Mouth Rinse BID  . Chlorhexidine Gluconate Cloth  6 each Topical Q0600  . enoxaparin (LOVENOX) injection  30 mg Subcutaneous Q24H  . ipratropium-albuterol  3 mL Nebulization Q6H  . mupirocin ointment  1 application Nasal BID  . pantoprazole (PROTONIX) IV  40 mg Intravenous QHS  . piperacillin-tazobactam (ZOSYN)  IV  3.375 g Intravenous Q8H  . sodium chloride  10-40 mL Intracatheter Q12H   Continuous: . dextrose 5 % and 0.9% NaCl 75 mL/hr at 05/25/13 2241   [id number] chloride, sodium chloride  Assesment: She was admitted with acute tonsillitis and sepsis from that. She has Klebsiella in the blood. She developed acute respiratory failure required intubation and mechanical ventilation but has now come off the ventilator. I think she has pneumonia as well probably from aspiration. She had acute encephalopathy which is better. Active Problems:   Paraplegia   Pyelonephritis, acute   Respiratory failure, acute   Sepsis   Encephalopathy acute   Acute renal failure   Hyperkalemia   Hypothermia    Plan: No change in treatments today I think she probably needs some diuresis. Her diet can be advanced as she tolerates clear liquids. I think she should stay in the ICU today.    LOS: 5 days   Kristjan Derner L  05/26/2013, 8:18 AM
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
## 2013-05-26 NOTE — Clinical Social Work Note (Signed)
CSW spoke w patient re SNF placement possibility - patient clearly states that she does not want to go to SNF and wants to return home.  Says that she has CAP aide and that she would have an RN at night -  Clarified that RN sent by [facility] would only be there periodically and that family would need to be caring for patient when CAP aide is not at the home.  RN CM clarified [place] assistance w son Valarie Merino by phone.  Appears that family understands.  CSW signing off as no further SW needs identified.  Edwyna Shell, LCSW Clinical Social Worker [phone])

## 2013-05-27 LAB — CBC WITH DIFFERENTIAL/PLATELET
Basophils Absolute: 0 10*3/uL (ref 0.0–0.1)
Basophils Relative: 0 % (ref 0–1)
Eosinophils Absolute: 0.7 10*3/uL (ref 0.0–0.7)
Eosinophils Relative: 7 % — ABNORMAL HIGH (ref 0–5)
HCT: 26.7 % — ABNORMAL LOW (ref 36.0–46.0)
Hemoglobin: 8.9 g/dL — ABNORMAL LOW (ref 12.0–15.0)
Lymphocytes Relative: 15 % (ref 12–46)
Lymphs Abs: 1.4 10*3/uL (ref 0.7–4.0)
MCH: 29.8 pg (ref 26.0–34.0)
MCHC: 33.3 g/dL (ref 30.0–36.0)
MCV: 89.3 fL (ref 78.0–100.0)
Monocytes Absolute: 0.6 10*3/uL (ref 0.1–1.0)
Monocytes Relative: 6 % (ref 3–12)
Neutro Abs: 7.1 10*3/uL (ref 1.7–7.7)
Neutrophils Relative %: 72 % (ref 43–77)
Platelets: 235 10*3/uL (ref 150–400)
RBC: 2.99 MIL/uL — ABNORMAL LOW (ref 3.87–5.11)
RDW: 15.9 % — ABNORMAL HIGH (ref 11.5–15.5)
WBC: 9.8 10*3/uL (ref 4.0–10.5)

## 2013-05-27 LAB — BASIC METABOLIC PANEL
BUN: 19 mg/dL (ref 6–23)
CO2: 20 mEq/L (ref 19–32)
Calcium: 7.5 mg/dL — ABNORMAL LOW (ref 8.4–10.5)
Chloride: 104 mEq/L (ref 96–112)
Creatinine, Ser: 2.6 mg/dL — ABNORMAL HIGH (ref 0.50–1.10)
GFR calc Af Amer: 24 mL/min — ABNORMAL LOW (ref >90)
GFR calc non Af Amer: 21 mL/min — ABNORMAL LOW (ref >90)
Glucose, Bld: 88 mg/dL (ref 70–99)
Potassium: 3.3 mEq/L — ABNORMAL LOW (ref 3.7–5.3)
Sodium: 141 mEq/L (ref 137–147)

## 2013-05-27 MED ORDER — POTASSIUM CHLORIDE CRYS ER 20 MEQ PO TBCR
40.0000 meq | EXTENDED_RELEASE_TABLET | Freq: Once | ORAL | Status: AC
  Administered 2013-05-27: 40 meq via ORAL
  Filled 2013-05-27: qty 2

## 2013-05-27 MED ORDER — DEXTROSE 5 % IV SOLN
1.0000 g | INTRAVENOUS | Status: DC
  Administered 2013-05-27 – 2013-06-02 (×7): 1 g via INTRAVENOUS
  Filled 2013-05-27 (×9): qty 10

## 2013-05-27 MED ORDER — POTASSIUM CHLORIDE CRYS ER 20 MEQ PO TBCR: 40.0000 meq | EXTENDED_RELEASE_TABLET | Freq: Two times a day (BID) | ORAL | Status: DC

## 2013-05-27 MED ORDER — POTASSIUM CHLORIDE CRYS ER 20 MEQ PO TBCR
40.0000 meq | EXTENDED_RELEASE_TABLET | Freq: Two times a day (BID) | ORAL | Status: DC
  Administered 2013-05-27 – 2013-05-31 (×9): 40 meq via ORAL
  Filled 2013-05-27 (×5): qty 2
  Filled 2013-05-27: qty 4
  Filled 2013-05-27 (×3): qty 2
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
## 2013-05-27 NOTE — Progress Notes (Signed)
Pt to be transferred to tele per MD. Report called to RN. Pt transferred via bed with personal belongings. No family at bedside at the moment, but will call family and make them aware of transfer.

## 2013-05-28 LAB — CREATININE, SERUM
Creatinine, Ser: 2.54 mg/dL — ABNORMAL HIGH (ref 0.50–1.10)
GFR, EST AFRICAN AMERICAN: 25 mL/min — AB (ref >90)
GFR, EST NON AFRICAN AMERICAN: 22 mL/min — AB (ref >90)

## 2013-05-28 MED ORDER — IPRATROPIUM-ALBUTEROL 0.5-2.5 (3) MG/3ML IN SOLN
3.0000 mL | Freq: Three times a day (TID) | RESPIRATORY_TRACT | Status: DC
  Administered 2013-05-29 – 2013-06-03 (×16): 3 mL via RESPIRATORY_TRACT
  Filled 2013-05-28 (×16): qty 3
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
## 2013-05-28 NOTE — Progress Notes (Signed)
Subjective: She says she feels better but is weak. She has no new complaints. She has been moved from the ICU.  Objective: Vital signs in last 24 hours: Temp:  [97.4 F (36.3 C)-97.6 F (36.4 C)] 97.6 F (36.4 C) (01/02 0605) Pulse Rate:  [91-104] 93 (01/02 0605) Resp:  [16-18] 16 (01/02 0605) BP: (108-150)/(77-108) 122/78 mmHg (01/02 0605) SpO2:  [90 %-100 %] 97 % (01/02 0906) Weight:  [86 kg (189 lb 9.5 oz)] 86 kg (189 lb 9.5 oz) (01/02 0500) Weight change: -5.3 kg (-11 lb 11 oz) Last BM Date: 05/27/13  Intake/Output from previous day: 01/01 0701 - 01/02 0700 In: 2587.3 [P.O.:690; I.V.:1847.3; IV Piggyback:50] Out: 8400 [Urine:8400]  PHYSICAL EXAM General appearance: alert, cooperative and mild distress Resp: clear to auscultation bilaterally Cardio: regular rate and rhythm, S1, S2 normal, no murmur, click, rub or gallop GI: soft, non-tender; bowel sounds normal; no masses,  no organomegaly Extremities: She has swelling of her extremities diffusely. She is paraplegic  Lab Results:    Basic Metabolic Panel:  Recent Labs  05/26/13 0411 05/27/13 0446 05/28/13 0446  NA 144 141  --   K 3.1* 3.3*  --   CL 110 104  --   CO2 17* 20  --   GLUCOSE 99 88  --   BUN 22 19  --   CREATININE 2.88* 2.60* 2.54*  CALCIUM 7.4* 7.5*  --    Liver Function Tests: No results found for this basename: AST, ALT, ALKPHOS, BILITOT, PROT, ALBUMIN,  in the last 72 hours No results found for this basename: LIPASE, AMYLASE,  in the last 72 hours No results found for this basename: AMMONIA,  in the last 72 hours CBC:  Recent Labs  05/26/13 0411 05/27/13 0446  WBC 7.7 9.8  NEUTROABS  --  7.1  HGB 8.6* 8.9*  HCT 26.5* 26.7*  MCV 91.1 89.3  PLT 178 235   Cardiac Enzymes: No results found for this basename: CKTOTAL, CKMB, CKMBINDEX, TROPONINI,  in the last 72 hours BNP: No results found for this basename: PROBNP,  in the last 72 hours D-Dimer: No results found for this basename:  DDIMER,  in the last 72 hours CBG: No results found for this basename: GLUCAP,  in the last 72 hours Hemoglobin A1C: No results found for this basename: HGBA1C,  in the last 72 hours Fasting Lipid Panel: No results found for this basename: CHOL, HDL, LDLCALC, TRIG, CHOLHDL, LDLDIRECT,  in the last 72 hours Thyroid Function Tests: No results found for this basename: TSH, T4TOTAL, FREET4, T3FREE, THYROIDAB,  in the last 72 hours Anemia Panel:  Recent Labs  05/25/13 1006  VITAMINB12 764  FOLATE 14.9  FERRITIN 159  TIBC 155*  IRON 28*  RETICCTPCT 1.1   Coagulation: No results found for this basename: LABPROT, INR,  in the last 72 hours Urine Drug Screen: Drugs of Abuse     Component Value Date/Time   LABOPIA NONE DETECTED 05/21/2013 1358   COCAINSCRNUR NONE DETECTED 05/21/2013 1358   LABBENZ NONE DETECTED 05/21/2013 1358   AMPHETMU NONE DETECTED 05/21/2013 1358   THCU NONE DETECTED 05/21/2013 1358   LABBARB NONE DETECTED 05/21/2013 1358    Alcohol Level: No results found for this basename: ETH,  in the last 72 hours Urinalysis: No results found for this basename: COLORURINE, APPERANCEUR, LABSPEC, PHURINE, GLUCOSEU, HGBUR, BILIRUBINUR, KETONESUR, PROTEINUR, UROBILINOGEN, NITRITE, LEUKOCYTESUR,  in the last 72 hours Misc. Labs:  ABGS  Recent Labs  05/26/13 0520  PHART  7.269*  PO2ART 118.0*  TCO2 16.3  HCO3 17.0*   CULTURES Recent Results (from the past 240 hour(s))  CULTURE, BLOOD (ROUTINE X 2)     Status: None   Collection Time    05/21/13 11:46 AM      Result Value Range Status   Specimen Description RIGHT ANTECUBITAL   Final   Special Requests BOTTLES DRAWN AEROBIC AND ANAEROBIC 10CC EACH   Final   Culture  Setup Time     Final   Value: 05/22/2013 21:42     Performed at [facility]   Culture     Final   Value: KLEBSIELLA PNEUMONIAE     Note: SUSCEPTIBILITIES PERFORMED ON PREVIOUS CULTURE WITHIN THE LAST 5 DAYS.     Note: Gram Stain Report Called  to,Read Back By and Verified With: [id number] RN AT 2339 ON 05/21/13 BY S.VANHOORNE Performed at [facility]     Performed at [facility]   Report Status 05/24/2013 FINAL   Final  CULTURE, BLOOD (ROUTINE X 2)     Status: None   Collection Time    05/21/13 11:51 AM      Result Value Range Status   Specimen Description RIGHT ANTECUBITAL   Final   Special Requests BOTTLES DRAWN AEROBIC AND ANAEROBIC 12CC EACH   Final   Culture  Setup Time     Final   Value: 05/22/2013 21:29     Performed at [facility]   Culture     Final   Value: KLEBSIELLA PNEUMONIAE     Note: Gram Stain Report Called to,Read Back By and Verified With: R.WAGONER AT 2243 ON 05/21/13 BY S.VANHOORNE Performed at [facility]     Performed at [facility]   Report Status 05/24/2013 FINAL   Final   Organism ID, Bacteria KLEBSIELLA PNEUMONIAE   Final  URINE CULTURE     Status: None   Collection Time    05/21/13  1:58 PM      Result Value Range Status   Specimen Description URINE, CATHETERIZED   Final   Special Requests NONE   Final   Culture  Setup Time     Final   Value: 05/22/2013 19:13     Performed at [place]     Final   Value: >=100,000 COLONIES/ML     Performed at [facility]   Culture     Final   Value: Multiple bacterial morphotypes present, none predominant. Suggest appropriate recollection if clinically indicated.     Performed at [facility]   Report Status 05/23/2013 FINAL   Final  MRSA PCR SCREENING     Status: Abnormal   Collection Time    05/21/13  5:00 PM      Result Value Range Status   MRSA by PCR POSITIVE (*) NEGATIVE Final   Comment:            The GeneXpert MRSA Assay (FDA     approved for NASAL specimens     only), is one component of a     comprehensive MRSA colonization     surveillance program. It is not     intended to diagnose MRSA     infection nor to guide or     monitor treatment for     MRSA  infections.     RESULT CALLED TO, READ BACK BY AND VERIFIED WITH:     M.T. AT 1905 ON  05/21/13 BY S.VANHOORNE   Studies/Results: US Carotid Duplex Bilateral  05/26/2013   CLINICAL DATA:  Frontal infarct.  EXAM: BILATERAL CAROTID DUPLEX ULTRASOUND  TECHNIQUE: Pearline Cables scale imaging, color Doppler and duplex ultrasound were performed of bilateral carotid and vertebral arteries in the neck.  COMPARISON:  CT 05/21/2013.  FINDINGS: Criteria: Quantification of carotid stenosis is based on velocity parameters that correlate the residual internal carotid diameter with NASCET-based stenosis levels, using the diameter of the distal internal carotid lumen as the denominator for stenosis measurement.  The following velocity measurements were obtained:  RIGHT  ICA:  129 cm/sec  CCA:  81 cm/sec  SYSTOLIC ICA/CCA RATIO:  1.6.  DIASTOLIC ICA/CCA RATIO:  1.6.  ECA:  90 cm/sec  LEFT  ICA:  131 cm/sec  CCA:  76 cm/sec  SYSTOLIC ICA/CCA RATIO:  1.7.  DIASTOLIC ICA/CCA RATIO:  1.7.  ECA:  74 cm/sec  RIGHT CAROTID ARTERY: Mild intimal thickening, no hemodynamically significant stenosis .  RIGHT VERTEBRAL ARTERY:  Patent with antegrade flow.  LEFT CAROTID ARTERY: Mild intimal thickening, no hemodynamically significant stenosis.  LEFT VERTEBRAL ARTERY:  Patent with antegrade flow.  This was a limited exam due to lack of patient cooperation and patient movement.  IMPRESSION: Limited exam due to poor patient cooperation and patient movement. No hemodynamically significant carotid atherosclerotic vascular disease. Vertebrals are patent with antegrade flow.   Electronically Signed   By: Marcello Moores  Register   On: 05/26/2013 17:33    Medications:  Prior to Admission:  Prescriptions prior to admission  Medication Sig Dispense Refill  . baclofen (LIORESAL) 20 MG tablet Take 40 mg by mouth 4 (four) times daily.       . dantrolene (DANTRIUM) 25 MG capsule Take 25 mg by mouth daily.       . diazepam (VALIUM) 5 MG tablet Take 5 mg by mouth  every 12 (twelve) hours as needed. For anxiety      . docusate sodium (COLACE) 100 MG capsule Take 100 mg by mouth at bedtime.      . fesoterodine (TOVIAZ) 4 MG TB24 Take 1 tablet (4 mg total) by mouth daily.  30 tablet  0  . gabapentin (NEURONTIN) 300 MG capsule Take 300 mg by mouth 2 (two) times daily.       Marland Kitchen HYDROcodone-acetaminophen (NORCO/VICODIN) 5-325 MG per tablet Take 1 tablet by mouth Every 4 hours as needed. Pain.      . lidocaine (LIDODERM) 5 % Place 1 patch onto the skin daily. Remove & Discard patch within 12 hours or as directed by MD      . Multiple Vitamin (MULTIVITAMIN) tablet Take 1 tablet by mouth daily.      . naproxen (NAPROSYN) 500 MG tablet Take 500 mg by mouth 2 (two) times daily with a meal.         Scheduled: . antiseptic oral rinse  15 mL Mouth Rinse QID  . cefTRIAXone (ROCEPHIN)  IV  1 g Intravenous Q24H  . enoxaparin (LOVENOX) injection  30 mg Subcutaneous Q24H  . furosemide  40 mg Intravenous Q12H  . ipratropium-albuterol  3 mL Nebulization Q6H  . pantoprazole (PROTONIX) IV  40 mg Intravenous QHS  . potassium chloride  40 mEq Oral BID WC  . sodium chloride  10-40 mL Intracatheter Q12H   Continuous: . dextrose 5 % and 0.9% NaCl 75 mL/hr at 05/27/13 2052   [id number] chloride, sodium chloride  Assesment: She was admitted with acute nephritis and sepsis. She is improved. She  is weaker than she has been. She has paraplegia. She has acute renal failure which is improving Active Problems:   Paraplegia   Pyelonephritis, acute   Respiratory failure, acute   Sepsis   Encephalopathy acute   Acute renal failure   Hyperkalemia   Hypothermia    Plan: Physical therapy consultation. Continue other treatments.    LOS: 7 days   Surena Welge L 05/28/2013, 9:50 AM
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
## 2013-05-29 LAB — URINALYSIS, DIPSTICK ONLY
BILIRUBIN URINE: NEGATIVE
GLUCOSE, UA: NEGATIVE mg/dL
Ketones, ur: NEGATIVE mg/dL
Leukocytes, UA: NEGATIVE
Nitrite: NEGATIVE
PH: 6 (ref 5.0–8.0)
Protein, ur: 100 mg/dL — AB
SPECIFIC GRAVITY, URINE: 1.015 (ref 1.005–1.030)
Urobilinogen, UA: 0.2 mg/dL (ref 0.0–1.0)

## 2013-05-29 LAB — OCCULT BLOOD X 1 CARD TO LAB, STOOL: Fecal Occult Bld: NEGATIVE

## 2013-05-29 LAB — CLOSTRIDIUM DIFFICILE BY PCR: CDIFFPCR: NEGATIVE

## 2013-05-29 NOTE — Progress Notes (Signed)
Subjective: Interval History: has no complaint of nausea or vomiting. Patient denies any difficulty in breathing. According to family members he states that she looks somewhat confused. And talking with her patient seems to be answering questions appropriately..  Objective: Vital signs in last 24 hours: Temp:  [98 F (36.7 C)-98.8 F (37.1 C)] 98 F (36.7 C) (01/03 0627) Pulse Rate:  [81-103] 81 (01/03 0627) Resp:  [16-20] 20 (01/03 0627) BP: (118-129)/(75-81) 124/75 mmHg (01/03 0627) SpO2:  [97 %-99 %] 97 % (01/03 0743) Weight:  [80.7 kg (177 lb 14.6 oz)] 80.7 kg (177 lb 14.6 oz) (01/03 0627) Weight change: -5.3 kg (-11 lb 11 oz)  Intake/Output from previous day: 01/02 0701 - 01/03 0700 In: -  Out: 5550 [Urine:5550] Intake/Output this shift:    General appearance: alert, cooperative and no distress Resp: clear to auscultation bilaterally Cardio: regular rate and rhythm, S1, S2 normal, no murmur, click, rub or gallop GI: soft, non-tender; bowel sounds normal; no masses,  no organomegaly Extremities: extremities normal, atraumatic, no cyanosis or edema  Lab Results:  Recent Labs  05/27/13 0446  WBC 9.8  HGB 8.9*  HCT 26.7*  PLT 235   BMET:  Recent Labs  05/27/13 0446 05/28/13 0446  NA 141  --   K 3.3*  --   CL 104  --   CO2 20  --   GLUCOSE 88  --   BUN 19  --   CREATININE 2.60* 2.54*  CALCIUM 7.5*  --    No results found for this basename: PTH,  in the last 72 hours Iron Studies: No results found for this basename: IRON, TIBC, TRANSFERRIN, FERRITIN,  in the last 72 hours  Studies/Results: No results found.  I have reviewed the patient's current medications.  Assessment/Plan: Problem #1 acute kidney injury:. Her creatinine is 2.54 seems to be improving. Patient presently seems to be polyuric. Problem #2 respiratory failure: Has resolved. Problem #3 hypokalemia: Potassium is not available today. Last potassium still history has improved. Problem #4  septic shock: Her blood pressure is better Problem #5 anemia: Her hemoglobin and hematocrit is stable. Problem #6 encephalopathy: Patient seems to have improved. Problem #7 paraplegia Plan: We'll DC Lasix We'll increase her IV fluid to 100 cc per hour We'll check her basic metabolic panel, phosphorus and CBC in the morning.   LOS: 8 days   Nayara Taplin S 05/29/2013,9:38 AM

## 2013-05-29 NOTE — Progress Notes (Signed)
Subjective: She still is somewhat confused. She has no other new complaints. Her weakness is better.  Objective: Vital signs in last 24 hours: Temp:  [98 F (36.7 C)-98.8 F (37.1 C)] 98 F (36.7 C) (01/03 0627) Pulse Rate:  [81-103] 81 (01/03 0627) Resp:  [16-20] 20 (01/03 0627) BP: (118-129)/(75-81) 124/75 mmHg (01/03 0627) SpO2:  [97 %-99 %] 97 % (01/03 0743) Weight:  [80.7 kg (177 lb 14.6 oz)] 80.7 kg (177 lb 14.6 oz) (01/03 0627) Weight change: -5.3 kg (-11 lb 11 oz) Last BM Date: 05/28/13  Intake/Output from previous day: 01/02 0701 - 01/03 0700 In: -  Out: 5550 [Urine:5550]  PHYSICAL EXAM General appearance: alert, mild distress and Mildly confused Resp: rhonchi bilaterally Cardio: regular rate and rhythm, S1, S2 normal, no murmur, click, rub or gallop GI: soft, non-tender; bowel sounds normal; no masses,  no organomegaly Extremities: Paraplegic  Lab Results:    Basic Metabolic Panel:  Recent Labs  05/27/13 0446 05/28/13 0446  NA 141  --   K 3.3*  --   CL 104  --   CO2 20  --   GLUCOSE 88  --   BUN 19  --   CREATININE 2.60* 2.54*  CALCIUM 7.5*  --    Liver Function Tests: No results found for this basename: AST, ALT, ALKPHOS, BILITOT, PROT, ALBUMIN,  in the last 72 hours No results found for this basename: LIPASE, AMYLASE,  in the last 72 hours No results found for this basename: AMMONIA,  in the last 72 hours CBC:  Recent Labs  05/27/13 0446  WBC 9.8  NEUTROABS 7.1  HGB 8.9*  HCT 26.7*  MCV 89.3  PLT 235   Cardiac Enzymes: No results found for this basename: CKTOTAL, CKMB, CKMBINDEX, TROPONINI,  in the last 72 hours BNP: No results found for this basename: PROBNP,  in the last 72 hours D-Dimer: No results found for this basename: DDIMER,  in the last 72 hours CBG: No results found for this basename: GLUCAP,  in the last 72 hours Hemoglobin A1C: No results found for this basename: HGBA1C,  in the last 72 hours Fasting Lipid Panel: No  results found for this basename: CHOL, HDL, LDLCALC, TRIG, CHOLHDL, LDLDIRECT,  in the last 72 hours Thyroid Function Tests: No results found for this basename: TSH, T4TOTAL, FREET4, T3FREE, THYROIDAB,  in the last 72 hours Anemia Panel: No results found for this basename: VITAMINB12, FOLATE, FERRITIN, TIBC, IRON, RETICCTPCT,  in the last 72 hours Coagulation: No results found for this basename: LABPROT, INR,  in the last 72 hours Urine Drug Screen: Drugs of Abuse     Component Value Date/Time   LABOPIA NONE DETECTED 05/21/2013 1358   COCAINSCRNUR NONE DETECTED 05/21/2013 1358   LABBENZ NONE DETECTED 05/21/2013 1358   AMPHETMU NONE DETECTED 05/21/2013 1358   THCU NONE DETECTED 05/21/2013 1358   LABBARB NONE DETECTED 05/21/2013 1358    Alcohol Level: No results found for this basename: ETH,  in the last 72 hours Urinalysis:  Recent Labs  05/29/13 1042  LABSPEC 1.015  PHURINE 6.0  GLUCOSEU NEGATIVE  HGBUR MODERATE*  BILIRUBINUR NEGATIVE  KETONESUR NEGATIVE  PROTEINUR 100*  UROBILINOGEN 0.2  NITRITE NEGATIVE  LEUKOCYTESUR NEGATIVE   Misc. Labs:  ABGS No results found for this basename: PHART, PCO2, PO2ART, TCO2, HCO3,  in the last 72 hours CULTURES Recent Results (from the past 240 hour(s))  CULTURE, BLOOD (ROUTINE X 2)     Status: None   Collection  Time    05/21/13 11:46 AM      Result Value Range Status   Specimen Description RIGHT ANTECUBITAL   Final   Special Requests BOTTLES DRAWN AEROBIC AND ANAEROBIC 10CC EACH   Final   Culture  Setup Time     Final   Value: 05/22/2013 21:42     Performed at [facility]   Culture     Final   Value: KLEBSIELLA PNEUMONIAE     Note: SUSCEPTIBILITIES PERFORMED ON PREVIOUS CULTURE WITHIN THE LAST 5 DAYS.     Note: Gram Stain Report Called to,Read Back By and Verified With: [id number] RN AT 2339 ON 05/21/13 BY S.VANHOORNE Performed at [facility]     Performed at [facility]   Report Status 05/24/2013  FINAL   Final  CULTURE, BLOOD (ROUTINE X 2)     Status: None   Collection Time    05/21/13 11:51 AM      Result Value Range Status   Specimen Description RIGHT ANTECUBITAL   Final   Special Requests BOTTLES DRAWN AEROBIC AND ANAEROBIC 12CC EACH   Final   Culture  Setup Time     Final   Value: 05/22/2013 21:29     Performed at [facility]   Culture     Final   Value: KLEBSIELLA PNEUMONIAE     Note: Gram Stain Report Called to,Read Back By and Verified With: R.WAGONER AT 2243 ON 05/21/13 BY S.VANHOORNE Performed at [facility]     Performed at [facility]   Report Status 05/24/2013 FINAL   Final   Organism ID, Bacteria KLEBSIELLA PNEUMONIAE   Final  URINE CULTURE     Status: None   Collection Time    05/21/13  1:58 PM      Result Value Range Status   Specimen Description URINE, CATHETERIZED   Final   Special Requests NONE   Final   Culture  Setup Time     Final   Value: 05/22/2013 19:13     Performed at [facility]     Final   Value: >=100,000 COLONIES/ML     Performed at [facility]   Culture     Final   Value: Multiple bacterial morphotypes present, none predominant. Suggest appropriate recollection if clinically indicated.     Performed at [facility]   Report Status 05/23/2013 FINAL   Final  MRSA PCR SCREENING     Status: Abnormal   Collection Time    05/21/13  5:00 PM      Result Value Range Status   MRSA by PCR POSITIVE (*) NEGATIVE Final   Comment:            The GeneXpert MRSA Assay (FDA     approved for NASAL specimens     only), is one component of a     comprehensive MRSA colonization     surveillance program. It is not     intended to diagnose MRSA     infection nor to guide or     monitor treatment for     MRSA infections.     RESULT CALLED TO, READ BACK BY AND VERIFIED WITH:     M.T. AT 1905 ON 05/21/13 BY S.VANHOORNE   Studies/Results: No results found.  Medications:  Prior to  Admission:  Prescriptions prior to admission  Medication Sig Dispense Refill  . baclofen (LIORESAL) 20 MG tablet Take 40 mg  by mouth 4 (four) times daily.       . dantrolene (DANTRIUM) 25 MG capsule Take 25 mg by mouth daily.       . diazepam (VALIUM) 5 MG tablet Take 5 mg by mouth every 12 (twelve) hours as needed. For anxiety      . docusate sodium (COLACE) 100 MG capsule Take 100 mg by mouth at bedtime.      . fesoterodine (TOVIAZ) 4 MG TB24 Take 1 tablet (4 mg total) by mouth daily.  30 tablet  0  . gabapentin (NEURONTIN) 300 MG capsule Take 300 mg by mouth 2 (two) times daily.       Marland Kitchen HYDROcodone-acetaminophen (NORCO/VICODIN) 5-325 MG per tablet Take 1 tablet by mouth Every 4 hours as needed. Pain.      . lidocaine (LIDODERM) 5 % Place 1 patch onto the skin daily. Remove & Discard patch within 12 hours or as directed by MD      . Multiple Vitamin (MULTIVITAMIN) tablet Take 1 tablet by mouth daily.      . naproxen (NAPROSYN) 500 MG tablet Take 500 mg by mouth 2 (two) times daily with a meal.         Scheduled: . antiseptic oral rinse  15 mL Mouth Rinse QID  . cefTRIAXone (ROCEPHIN)  IV  1 g Intravenous Q24H  . enoxaparin (LOVENOX) injection  30 mg Subcutaneous Q24H  . ipratropium-albuterol  3 mL Nebulization TID  . pantoprazole (PROTONIX) IV  40 mg Intravenous QHS  . potassium chloride  40 mEq Oral BID WC  . sodium chloride  10-40 mL Intracatheter Q12H   Continuous: . dextrose 5 % and 0.9% NaCl 100 mL/hr at 05/29/13 1028   [id number] chloride  Assesment: She was admitted with pyelonephritis and sepsis with positive blood cultures. She developed acute renal failure. She has what appears to be aspiration pneumonia as well. She's had an acute metabolic encephalopathy and is still having some trouble with that. Active Problems:   Paraplegia   Pyelonephritis, acute   Respiratory failure, acute   Sepsis   Encephalopathy acute   Acute renal failure   Hyperkalemia    Hypothermia    Plan: I will recheck urine. Continue her other treatments. Overall she is much improved    LOS: 8 days   Vega Stare L 05/29/2013, 11:22 AM

## 2013-05-29 NOTE — Progress Notes (Signed)
Patient tested for C.Diff per nursing protocol  Having semi formed/loose stools frequently and on antibiotics.  Test results negative.  Brown contact precautions D/C'd

## 2013-05-30 LAB — CBC
HEMATOCRIT: 28.6 % — AB (ref 36.0–46.0)
HEMOGLOBIN: 9.5 g/dL — AB (ref 12.0–15.0)
MCH: 29.5 pg (ref 26.0–34.0)
MCHC: 33.2 g/dL (ref 30.0–36.0)
MCV: 88.8 fL (ref 78.0–100.0)
Platelets: 406 10*3/uL — ABNORMAL HIGH (ref 150–400)
RBC: 3.22 MIL/uL — AB (ref 3.87–5.11)
RDW: 14.7 % (ref 11.5–15.5)
WBC: 13.6 10*3/uL — AB (ref 4.0–10.5)

## 2013-05-30 LAB — URINE CULTURE
Colony Count: NO GROWTH
Culture: NO GROWTH

## 2013-05-30 LAB — BASIC METABOLIC PANEL
BUN: 10 mg/dL (ref 6–23)
CHLORIDE: 99 meq/L (ref 96–112)
CO2: 25 meq/L (ref 19–32)
Calcium: 8.1 mg/dL — ABNORMAL LOW (ref 8.4–10.5)
Creatinine, Ser: 1.74 mg/dL — ABNORMAL HIGH (ref 0.50–1.10)
GFR calc Af Amer: 40 mL/min — ABNORMAL LOW (ref >90)
GFR calc non Af Amer: 34 mL/min — ABNORMAL LOW (ref >90)
GLUCOSE: 111 mg/dL — AB (ref 70–99)
POTASSIUM: 3.5 meq/L — AB (ref 3.7–5.3)
SODIUM: 137 meq/L (ref 137–147)

## 2013-05-30 LAB — PHOSPHORUS: Phosphorus: 2.2 mg/dL — ABNORMAL LOW (ref 2.3–4.6)

## 2013-05-30 MED ORDER — LORAZEPAM 2 MG/ML IJ SOLN
1.0000 mg | INTRAMUSCULAR | Status: DC | PRN
  Administered 2013-05-30 – 2013-05-31 (×5): 1 mg via INTRAVENOUS
  Filled 2013-05-30 (×5): qty 1

## 2013-05-30 MED ORDER — ENOXAPARIN SODIUM 40 MG/0.4ML ~~LOC~~ SOLN
40.0000 mg | SUBCUTANEOUS | Status: DC
  Administered 2013-05-30 – 2013-06-04 (×5): 40 mg via SUBCUTANEOUS
  Filled 2013-05-30 (×5): qty 0.4

## 2013-05-30 NOTE — Progress Notes (Signed)
Subjective: Interval History: has no complaint of nausea or vomiting. Patient denies any difficulty in breathing. Patient presently offers no complaints.   Objective: Vital signs in last 24 hours: Temp:  [98.2 F (36.8 C)-98.9 F (37.2 C)] 98.2 F (36.8 C) (01/04 0703) Pulse Rate:  [83-108] 83 (01/04 0703) Resp:  [20] 20 (01/04 0703) BP: (129-141)/(68-82) 141/82 mmHg (01/04 0703) SpO2:  [91 %-98 %] 94 % (01/04 0740) Weight:  [81.1 kg (178 lb 12.7 oz)] 81.1 kg (178 lb 12.7 oz) (01/04 0500) Weight change: 0.4 kg (14.1 oz)  Intake/Output from previous day: 01/03 0701 - 01/04 0700 In: -  Out: 3150 [Urine:3150] Intake/Output this shift:    General appearance: alert, cooperative and no distress Resp: clear to auscultation bilaterally Cardio: regular rate and rhythm, S1, S2 normal, no murmur, click, rub or gallop GI: soft, non-tender; bowel sounds normal; no masses,  no organomegaly Extremities: extremities normal, atraumatic, no cyanosis or edema  Lab Results:  Recent Labs  05/30/13 0606  WBC 13.6*  HGB 9.5*  HCT 28.6*  PLT 406*   BMET:   Recent Labs  05/28/13 0446 05/30/13 0606  NA  --  137  K  --  3.5*  CL  --  99  CO2  --  25  GLUCOSE  --  111*  BUN  --  10  CREATININE 2.54* 1.74*  CALCIUM  --  8.1*   No results found for this basename: PTH,  in the last 72 hours Iron Studies: No results found for this basename: IRON, TIBC, TRANSFERRIN, FERRITIN,  in the last 72 hours  Studies/Results: No results found.  I have reviewed the patient's current medications.  Assessment/Plan: Problem #1 acute kidney injury:. Her creatinine is 1.7 seems progressively improving improving. Patient presently with good urine out put. Problem #2 respiratory failure: Has resolved. Problem #3 hypokalemia: Potassium has corrected Problem #4 septic shock: Her blood pressure is better Problem #5 anemia: Her hemoglobin and hematocrit is stable. Problem #6 encephalopathy: Patient seems  to have improved. Problem #7 paraplegia Problem #8 metabolic bone disease calcium and phosphorus is with in range. Plan:continue with present manegement   LOS: 9 days   Vinie Charity S 05/30/2013,9:48 AM

## 2013-05-31 ENCOUNTER — Inpatient Hospital Stay (HOSPITAL_COMMUNITY)
Admit: 2013-05-31 | Discharge: 2013-05-31 | Disposition: A | Discharge status: Home / Self Care | Payer: Medicaid Other | Attending: Neurology | Admitting: Neurology

## 2013-05-31 ENCOUNTER — Inpatient Hospital Stay (HOSPITAL_COMMUNITY): Payer: Medicaid Other

## 2013-05-31 LAB — BASIC METABOLIC PANEL
BUN: 6 mg/dL (ref 6–23)
CO2: 25 meq/L (ref 19–32)
CREATININE: 1.54 mg/dL — AB (ref 0.50–1.10)
Calcium: 8.3 mg/dL — ABNORMAL LOW (ref 8.4–10.5)
Chloride: 107 mEq/L (ref 96–112)
GFR calc non Af Amer: 40 mL/min — ABNORMAL LOW (ref >90)
GFR, EST AFRICAN AMERICAN: 46 mL/min — AB (ref >90)
Glucose, Bld: 98 mg/dL (ref 70–99)
Potassium: 4 mEq/L (ref 3.7–5.3)
Sodium: 142 mEq/L (ref 137–147)

## 2013-05-31 MED ORDER — PRO-STAT SUGAR FREE PO LIQD
30.0000 mL | Freq: Three times a day (TID) | ORAL | Status: DC
  Administered 2013-05-31 – 2013-06-04 (×12): 30 mL via ORAL
  Filled 2013-05-31 (×11): qty 30

## 2013-05-31 NOTE — Progress Notes (Signed)
Patient ID: [name], female   DOB: 1966/05/18, 48 y.o.   MRN: [id number]  [place] A. Merlene Laughter, MD     [email or web address]          [name] is an 48 y.o. female.   Assessment/Plan:  1. Acute/subacute encephalopathy. The clinical picture appears to be most consistent with toxic metabolic encephalopathy although there are no clear underlying toxic metabolic causes observed. She was previously confused/or responsive but improved after being treated for urosepsis. Exact etiology of the current encephalopathy is unclear. 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 She also has left frontal insufflation is similar where she has had these epileptiform discharges. I think we will repeat the imaging study this time with an MRI and also do an EEG.  We were called for repeat evaluation because of patient appears to be quite confused and disoriented. She had improved nicely and appears to be at baseline after she was extubated.  GENERAL: She is in no acute distress.  HEENT: Neck is supple head is normocephalic atraumatic. ABDOMEN: soft  EXTREMITIES: No edema   BACK: Unremarkable.  SKIN: Normal by inspection.    MENTAL STATUS: She is awake and alert but is quite confused. She is having visual hallucinations talking to people that are not present. Speech is nonsensical and not relevant to the current situation. Speech appears to be quite dysarthric. She does know that she is in the hospital at [facility]. At times however she thinks that she is at the pharmacy and her brothers getting medications for her. She does follow commands well.  CRANIAL NERVES: Pupils are equal, round and reactive to light and accommodation; extra ocular movements are full, there is no significant nystagmus; visual fields are full; upper and lower facial muscles are normal in strength  and symmetric, there is no flattening of the nasolabial folds; tongue is midline.  MOTOR: Normal tone, bulk and strength of the upper extremities; no pronator drift. Legs are 0/5.  COORDINATION: Left finger to nose is normal, right finger to nose is normal, No rest tremor; no intention tremor; no postural tremor; no bradykinesia.     Objective: Vital signs in last 24 hours: Temp:  [98.4 F (36.9 C)-99.2 F (37.3 C)] 99.2 F (37.3 C) (01/05 0500) Pulse Rate:  [72-95] 95 (01/05 0500) Resp:  [20] 20 (01/05 0500) BP: (142-152)/(75-90) 152/80 mmHg (01/05 0500) SpO2:  [94 %-97 %] 94 % (01/05 0733) Weight:  [82.6 kg (182 lb 1.6 oz)] 82.6 kg (182 lb 1.6 oz) (01/05 0500)  Intake/Output from previous day: 01/04 0701 - 01/05 0700 In: 1200 [I.V.:1200] Out: 1450 [Urine:1450] Intake/Output this shift:   Nutritional status: General   Lab Results: Results for orders placed during the hospital encounter of 05/21/13 (from the past 48 hour(s))  URINE CULTURE     Status: None   Collection Time    05/29/13 10:42 AM      Result Value Range   Specimen Description URINE, SUPRAPUBIC     Special Requests rocephin     Culture  Setup Time       Value: 05/29/2013 21:00     Performed at [facility]       Value: NO GROWTH     Performed at [facility]   Culture       Value: NO GROWTH     Performed at [facility]   Report Status 05/30/2013 FINAL    URINALYSIS, DIPSTICK ONLY     Status: Abnormal   Collection Time    05/29/13 10:42 AM      Result Value Range   Specific Gravity, Urine 1.015  1.005 - 1.030   pH 6.0  5.0 - 8.0   Glucose, UA NEGATIVE  NEGATIVE mg/dL   Hgb urine dipstick MODERATE (*) NEGATIVE   Bilirubin Urine NEGATIVE  NEGATIVE   Ketones, ur NEGATIVE  NEGATIVE mg/dL   Protein, ur 100 (*) NEGATIVE mg/dL   Urobilinogen, UA 0.2  0.0 - 1.0 mg/dL   Nitrite NEGATIVE  NEGATIVE   Leukocytes, UA NEGATIVE  NEGATIVE  OCCULT BLOOD X 1 CARD TO LAB,  STOOL     Status: None   Collection Time    05/29/13 10:49 AM      Result Value Range   Fecal Occult Bld NEGATIVE  NEGATIVE  CLOSTRIDIUM DIFFICILE BY PCR     Status: None   Collection Time    05/29/13 10:49 AM      Result Value Range   C difficile by pcr NEGATIVE  NEGATIVE  BASIC METABOLIC PANEL     Status: Abnormal   Collection Time    05/30/13  6:06 AM      Result Value Range   Sodium 137  137 - 147 mEq/L   Comment: Please note change in reference range.   Potassium 3.5 (*) 3.7 - 5.3 mEq/L   Comment: Please note change in reference range.   Chloride 99  96 - 112 mEq/L   CO2 25  19 - 32 mEq/L   Glucose, Bld 111 (*) 70 - 99 mg/dL   BUN 10  6 - 23 mg/dL   Creatinine, Ser 1.74 (*) 0.50 - 1.10 mg/dL   Calcium 8.1 (*) 8.4 - 10.5 mg/dL   GFR calc non Af Amer 34 (*) >90 mL/min   GFR calc Af Amer 40 (*) >90 mL/min   Comment: (NOTE)     The eGFR has been calculated using the CKD EPI equation.     This calculation has not been validated in all clinical situations.     eGFR's persistently <90 mL/min signify possible Chronic Kidney     Disease.  CBC     Status: Abnormal   Collection Time    05/30/13  6:06 AM      Result Value Range   WBC 13.6 (*) 4.0 - 10.5 K/uL   RBC 3.22 (*) 3.87 - 5.11 MIL/uL   Hemoglobin 9.5 (*) 12.0 - 15.0 g/dL   HCT 28.6 (*) 36.0 - 46.0 %   MCV 88.8  78.0 - 100.0 fL   MCH 29.5  26.0 - 34.0 pg   MCHC 33.2  30.0 - 36.0 g/dL   RDW 14.7  11.5 - 15.5 %   Platelets 406 (*) 150 - 400 K/uL  PHOSPHORUS     Status: Abnormal   Collection Time    05/30/13  6:06 AM      Result Value Range   Phosphorus 2.2 (*) 2.3 - 4.6 mg/dL  BASIC METABOLIC PANEL     Status: Abnormal   Collection Time    05/31/13  4:55 AM      Result Value Range   Sodium 142  137 - 147 mEq/L   Comment: Please note change in reference range.   Potassium 4.0  3.7 - 5.3 mEq/L   Comment: Please note change in reference range.  Chloride 107  96 - 112 mEq/L   Comment: DELTA CHECK NOTED   CO2 25   19 - 32 mEq/L   Glucose, Bld 98  70 - 99 mg/dL   BUN 6  6 - 23 mg/dL   Creatinine, Ser 1.54 (*) 0.50 - 1.10 mg/dL   Calcium 8.3 (*) 8.4 - 10.5 mg/dL   GFR calc non Af Amer 40 (*) >90 mL/min   GFR calc Af Amer 46 (*) >90 mL/min   Comment: (NOTE)     The eGFR has been calculated using the CKD EPI equation.     This calculation has not been validated in all clinical situations.     eGFR's persistently <90 mL/min signify possible Chronic Kidney     Disease.    Lipid Panel No results found for this basename: CHOL, TRIG, HDL, CHOLHDL, VLDL, LDLCALC,  in the last 72 hours  Studies/Results: No results found.  Medications:  Scheduled Meds: . antiseptic oral rinse  15 mL Mouth Rinse QID  . cefTRIAXone (ROCEPHIN)  IV  1 g Intravenous Q24H  . enoxaparin (LOVENOX) injection  40 mg Subcutaneous Q24H  . ipratropium-albuterol  3 mL Nebulization TID  . pantoprazole (PROTONIX) IV  40 mg Intravenous QHS  . potassium chloride  40 mEq Oral BID WC  . sodium chloride  10-40 mL Intracatheter Q12H   Continuous Infusions: . dextrose 5 % and 0.9% NaCl 100 mL/hr at 05/30/13 1642   PRN Meds:.LORazepam, sodium chloride     LOS: 10 days   Arlow Spiers A. Merlene Laughter, M.D.  Diplomate, Tax adviser of Psychiatry and Neurology ( Neurology).
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
## 2013-05-31 NOTE — Progress Notes (Signed)
Subjective: Interval History: patient seems moe confused today. She is more disoriented.  She offers no complaints.   Objective: Vital signs in last 24 hours: Temp:  [98.4 F (36.9 C)-99.2 F (37.3 C)] 99.2 F (37.3 C) (01/05 0500) Pulse Rate:  [72-95] 95 (01/05 0500) Resp:  [20] 20 (01/05 0500) BP: (142-152)/(75-90) 152/80 mmHg (01/05 0500) SpO2:  [94 %-97 %] 94 % (01/05 0733) Weight:  [82.6 kg (182 lb 1.6 oz)] 82.6 kg (182 lb 1.6 oz) (01/05 0500) Weight change: 1.5 kg (3 lb 4.9 oz)  Intake/Output from previous day: 01/04 0701 - 01/05 0700 In: 1200 [I.V.:1200] Out: 1450 [Urine:1450] Intake/Output this shift:    General appearance: alert, cooperative and no distress Resp: clear to auscultation bilaterally Cardio: regular rate and rhythm, S1, S2 normal, no murmur, click, rub or gallop GI: soft, non-tender; bowel sounds normal; no masses,  no organomegaly Extremities: extremities normal, atraumatic, no cyanosis or edema  Lab Results:  Recent Labs  05/30/13 0606  WBC 13.6*  HGB 9.5*  HCT 28.6*  PLT 406*   BMET:   Recent Labs  05/30/13 0606 05/31/13 0455  NA 137 142  K 3.5* 4.0  CL 99 107  CO2 25 25  GLUCOSE 111* 98  BUN 10 6  CREATININE 1.74* 1.54*  CALCIUM 8.1* 8.3*   No results found for this basename: PTH,  in the last 72 hours Iron Studies: No results found for this basename: IRON, TIBC, TRANSFERRIN, FERRITIN,  in the last 72 hours  Studies/Results: No results found.  I have reviewed the patient's current medications.  Assessment/Plan: Problem #1 acute kidney injury:. Her creatinine is 1.54  progressively improving improving. Patient presently with good urine out put. Problem #2 respiratory failure: Has resolved. Problem #3 hypokalemia: Potassium has corrected Problem #4 septic shock: Her blood pressure is better Problem #5 anemia: Her hemoglobin and hematocrit is stable. Problem #6 encephalopathy: Patient seems more confused and occassionaly with  fixed gaze and not communicating well Problem #7 paraplegia Problem #8 metabolic bone disease calcium and phosphorus is with in range. Plan:continue with present manegement   LOS: 10 days   Tenishia Ekman S 05/31/2013,11:04 AM

## 2013-05-31 NOTE — Clinical Social Work Note (Signed)
Reconsulted for possible SNF placement. CSW discussed with pt's sister/guardian who called [facility] and talked with them about placement there as this was only bed offer. Sister concerned about requirement to pay up front until long term Medicaid approved. She said that they are still considering taking pt back home. Pt has CAP aid and family is willing to stay with pt the rest of the time for around the clock assistance. Will follow up in AM for decision.  Benay Pike, [place]

## 2013-05-31 NOTE — Progress Notes (Signed)
EEG Completed; Results Pending  

## 2013-05-31 NOTE — Progress Notes (Signed)
NAMESEANTE, [name]            ACCOUNT NO.:  [id number]  MEDICAL RECORD NO.:  [id number]  LOCATION:                                 FACILITY:  PHYSICIAN:  Stefan Markarian L. Luan Pulling, M.D.DATE OF BIRTH:  [birth year]  DATE OF PROCEDURE:  05/30/2013 DATE OF DISCHARGE:                                PROGRESS NOTE   Patient of Dr. Legrand Rams.  SUBJECTIVE:  Ms. Alonge was admitted with multiple problems including sepsis.  She had a urinary tract infection which appeared to be Klebsiella.  She had Klebsiella in the blood.  She had what I think is aspiration pneumonia.  She had encephalopathy.  She has been confused and I rechecked her urine yesterday which really looked okay.  PHYSICAL EXAMINATION:  VITAL SIGNS:  Today shows that her temperature is 98.2, pulse 83, respirations 20, blood pressure 141/82, and O2 sats 94%. GENERAL:  She is awake and alert, but confused. CHEST:  Much clearer. HEART:  Regular. ABDOMEN:  Soft.  She is paraplegic and has less edema.  ASSESSMENT:  My assessment then is that she has multiple medical problems.  She is more confused.  I have reviewed her home medications, and she was on Valium at home and I wonder if this is from being off her Valium which was changed because of her altered mental status.  Her paraplegia is unchanged.  Her pneumonia seems to be better.  Her acute renal failure is better.  PLAN:  My plan then is to continue with her current treatments and medications.  No changes today except to start her on some Ativan.     Daleen Steinhaus L. Luan Pulling, M.D.     ELH/MEDQ  D:  05/30/2013  T:  05/31/2013  Job:  [id number]

## 2013-05-31 NOTE — Progress Notes (Signed)
NUTRITION FOLLOW UP  Intervention:   30 ml Prostat TID, which will provide 300 kcals and 45 grams protein daily.  Nutrition Dx:   Inadequate oral intake r/t decreased po's, increased nutritional needs AEB pressure ulcer, declining po's.   Goal:   Pt will meet >90% of estimated energy needs  Monitor:   PO intake, labs, weight changes, skin assessments, changes in status  Assessment:   Pt was extubated on 05/26/13 and has been transferred to medical floor out of ICU.  Pt is very disoriented and unable to participate in interview. EEG results pending. Pt continues to be on antibiotics. She tested negative for C-diff.  Discharge disposition is either home with family or SNF; family to make decision tomorrow. Pt with variable intake; PO: 25-100% with decline.  Noted 19# (9.4%) wt loss x 1 week, which is significant, however, likely due to fluid loss.   Height: Ht Readings from Last 1 Encounters:  05/24/13 5' 1.81" (1.57 m)    Weight Status:   Wt Readings from Last 1 Encounters:  05/31/13 182 lb 1.6 oz (82.6 kg)    Re-estimated needs:  Kcal: [id number] daily Protein: 154-165 grams daily Fluid: 2.5-2.9 L daily  Skin: Unstageable pressure ulcer on left ankle  Diet Order: General   Intake/Output Summary (Last 24 hours) at 05/31/13 1621 Last data filed at 05/31/13 1300  Gross per 24 hour  Intake   1440 ml  Output   2850 ml  Net  -1410 ml    Last BM: 05/30/13   Labs:   Recent Labs Lab 05/27/13 0446 05/28/13 0446 05/30/13 0606 05/31/13 0455  NA 141  --  137 142  K 3.3*  --  3.5* 4.0  CL 104  --  99 107  CO2 20  --  25 25  BUN 19  --  10 6  CREATININE 2.60* 2.54* 1.74* 1.54*  CALCIUM 7.5*  --  8.1* 8.3*  PHOS  --   --  2.2*  --   GLUCOSE 88  --  111* 98    CBG (last 3)  No results found for this basename: GLUCAP,  in the last 72 hours  Scheduled Meds: . antiseptic oral rinse  15 mL Mouth Rinse QID  . cefTRIAXone (ROCEPHIN)  IV  1 g Intravenous Q24H  .  enoxaparin (LOVENOX) injection  40 mg Subcutaneous Q24H  . ipratropium-albuterol  3 mL Nebulization TID  . pantoprazole (PROTONIX) IV  40 mg Intravenous QHS  . potassium chloride  40 mEq Oral BID WC  . sodium chloride  10-40 mL Intracatheter Q12H    Continuous Infusions: . dextrose 5 % and 0.9% NaCl 100 mL/hr at 05/30/13 1642    Trenia Tennyson A. Jimmye Norman, RD, LDN Pager: [phone]

## 2013-05-31 NOTE — Progress Notes (Signed)
Subjective: Patient is alert and awake but confused and disoriented.  Objective: Vital signs in last 24 hours: Temp:  [98.4 F (36.9 C)-99.2 F (37.3 C)] 99.2 F (37.3 C) (01/05 0500) Pulse Rate:  [72-95] 95 (01/05 0500) Resp:  [20] 20 (01/05 0500) BP: (142-152)/(75-90) 152/80 mmHg (01/05 0500) SpO2:  [94 %-97 %] 94 % (01/05 0733) Weight:  [82.6 kg (182 lb 1.6 oz)] 82.6 kg (182 lb 1.6 oz) (01/05 0500) Weight change: 1.5 kg (3 lb 4.9 oz) Last BM Date: 05/30/13  Intake/Output from previous day: 01/04 0701 - 01/05 0700 In: 1200 [I.V.:1200] Out: 1450 [Urine:1450]  PHYSICAL EXAM General appearance: moderate distress and slowed mentation Resp: diminished breath sounds bilaterally and rhonchi bilaterally Cardio: S1, S2 normal GI: soft, non-tender; bowel sounds normal; no masses,  no organomegaly Extremities: paraplegic and deformed lower extremities  Lab Results:    @labtest @ ABGS No results found for this basename: PHART, PCO2, PO2ART, TCO2, HCO3,  in the last 72 hours CULTURES Recent Results (from the past 240 hour(s))  CULTURE, BLOOD (ROUTINE X 2)     Status: None   Collection Time    05/21/13 11:46 AM      Result Value Range Status   Specimen Description RIGHT ANTECUBITAL   Final   Special Requests BOTTLES DRAWN AEROBIC AND ANAEROBIC 10CC EACH   Final   Culture  Setup Time     Final   Value: 05/22/2013 21:42     Performed at [facility]   Culture     Final   Value: KLEBSIELLA PNEUMONIAE     Note: SUSCEPTIBILITIES PERFORMED ON PREVIOUS CULTURE WITHIN THE LAST 5 DAYS.     Note: Gram Stain Report Called to,Read Back By and Verified With: [id number] RN AT 2339 ON 05/21/13 BY S.VANHOORNE Performed at [facility]     Performed at [facility]   Report Status 05/24/2013 FINAL   Final  CULTURE, BLOOD (ROUTINE X 2)     Status: None   Collection Time    05/21/13 11:51 AM      Result Value Range Status   Specimen Description RIGHT ANTECUBITAL   Final    Special Requests BOTTLES DRAWN AEROBIC AND ANAEROBIC 12CC EACH   Final   Culture  Setup Time     Final   Value: 05/22/2013 21:29     Performed at [facility]   Culture     Final   Value: KLEBSIELLA PNEUMONIAE     Note: Gram Stain Report Called to,Read Back By and Verified With: R.WAGONER AT 2243 ON 05/21/13 BY S.VANHOORNE Performed at [facility]     Performed at [facility]   Report Status 05/24/2013 FINAL   Final   Organism ID, Bacteria KLEBSIELLA PNEUMONIAE   Final  URINE CULTURE     Status: None   Collection Time    05/21/13  1:58 PM      Result Value Range Status   Specimen Description URINE, CATHETERIZED   Final   Special Requests NONE   Final   Culture  Setup Time     Final   Value: 05/22/2013 19:13     Performed at [place]     Final   Value: >=100,000 COLONIES/ML     Performed at [facility]   Culture     Final   Value: Multiple bacterial morphotypes present, none predominant. Suggest appropriate recollection if clinically indicated.     Performed  at [facility]   Report Status 05/23/2013 FINAL   Final  MRSA PCR SCREENING     Status: Abnormal   Collection Time    05/21/13  5:00 PM      Result Value Range Status   MRSA by PCR POSITIVE (*) NEGATIVE Final   Comment:            The GeneXpert MRSA Assay (FDA     approved for NASAL specimens     only), is one component of a     comprehensive MRSA colonization     surveillance program. It is not     intended to diagnose MRSA     infection nor to guide or     monitor treatment for     MRSA infections.     RESULT CALLED TO, READ BACK BY AND VERIFIED WITH:     M.T. AT 1905 ON 05/21/13 BY S.VANHOORNE  URINE CULTURE     Status: None   Collection Time    05/29/13 10:42 AM      Result Value Range Status   Specimen Description URINE, SUPRAPUBIC   Final   Special Requests rocephin   Final   Culture  Setup Time     Final   Value: 05/29/2013 21:00      Performed at [place]     Final   Value: NO GROWTH     Performed at [facility]   Culture     Final   Value: NO GROWTH     Performed at [facility]   Report Status 05/30/2013 FINAL   Final  CLOSTRIDIUM DIFFICILE BY PCR     Status: None   Collection Time    05/29/13 10:49 AM      Result Value Range Status   C difficile by pcr NEGATIVE  NEGATIVE Final   Studies/Results: No results found.  Medications: I have reviewed the patient's current medications.  Assesment: Active Problems:   Paraplegia   Pyelonephritis, acute   Respiratory failure, acute   Sepsis   Encephalopathy acute   Acute renal failure   Hyperkalemia   Hypothermia S/P extubation  Plan: Medications reviewed Neurology consult appreciated Patient may need placement Continue current treatment    LOS: 10 days   [name] 05/31/2013, 8:12 AM

## 2013-06-01 LAB — BASIC METABOLIC PANEL
BUN: 7 mg/dL (ref 6–23)
CO2: 23 meq/L (ref 19–32)
CREATININE: 1.57 mg/dL — AB (ref 0.50–1.10)
Calcium: 8.8 mg/dL (ref 8.4–10.5)
Chloride: 107 mEq/L (ref 96–112)
GFR calc non Af Amer: 39 mL/min — ABNORMAL LOW (ref >90)
GFR, EST AFRICAN AMERICAN: 45 mL/min — AB (ref >90)
Glucose, Bld: 86 mg/dL (ref 70–99)
POTASSIUM: 4.9 meq/L (ref 3.7–5.3)
Sodium: 143 mEq/L (ref 137–147)

## 2013-06-01 LAB — RPR: RPR: NONREACTIVE

## 2013-06-01 MED ORDER — HALOPERIDOL 2 MG PO TABS
1.0000 mg | ORAL_TABLET | Freq: Every morning | ORAL | Status: DC
  Administered 2013-06-01 – 2013-06-04 (×4): 1 mg via ORAL
  Filled 2013-06-01 (×4): qty 1

## 2013-06-01 MED ORDER — QUETIAPINE FUMARATE 25 MG PO TABS
50.0000 mg | ORAL_TABLET | Freq: Every day | ORAL | Status: DC
  Administered 2013-06-01: 50 mg via ORAL
  Filled 2013-06-01: qty 2

## 2013-06-01 NOTE — Progress Notes (Signed)
Subjective: Interval History: patient remained confused. Presently unable to get any additional information.  Objective: Vital signs in last 24 hours: Temp:  [97.9 F (36.6 C)-98.8 F (37.1 C)] 98.8 F (37.1 C) (01/06 0453) Pulse Rate:  [85-100] 85 (01/06 0453) Resp:  [20] 20 (01/06 0453) BP: (130-163)/(74-91) 163/91 mmHg (01/06 0453) SpO2:  [92 %-100 %] 92 % (01/06 0453) Weight:  [79.9 kg (176 lb 2.4 oz)] 79.9 kg (176 lb 2.4 oz) (01/06 0453) Weight change: -2.7 kg (-5 lb 15.2 oz)  Intake/Output from previous day: 01/05 0701 - 01/06 0700 In: 240 [P.O.:240] Out: 2900 [Urine:2900] Intake/Output this shift:    General appearance: alert, cooperative and no distress Resp: clear to auscultation bilaterally Cardio: regular rate and rhythm, S1, S2 normal, no murmur, click, rub or gallop GI: soft, non-tender; bowel sounds normal; no masses,  no organomegaly Extremities: extremities normal, atraumatic, no cyanosis or edema  Lab Results:  Recent Labs  05/30/13 0606  WBC 13.6*  HGB 9.5*  HCT 28.6*  PLT 406*   BMET:   Recent Labs  05/31/13 0455 06/01/13 0445  NA 142 143  K 4.0 4.9  CL 107 107  CO2 25 23  GLUCOSE 98 86  BUN 6 7  CREATININE 1.54* 1.57*  CALCIUM 8.3* 8.8   No results found for this basename: PTH,  in the last 72 hours Iron Studies: No results found for this basename: IRON, TIBC, TRANSFERRIN, FERRITIN,  in the last 72 hours  Studies/Results: Mr Brain Wo Contrast  05/31/2013   CLINICAL DATA:  Altered mental status.  Confusion.  EXAM: MRI HEAD WITHOUT CONTRAST  TECHNIQUE: Multiplanar, multiecho pulse sequences of the brain and surrounding structures were obtained without intravenous contrast.  COMPARISON:  CT head without contrast 05/21/2013  FINDINGS: The study is moderately degraded by patient motion on all sequences.  The diffusion-weighted images demonstrate no evidence for acute or subacute infarction. A remote posterior left frontal lobe infarct is  confirmed. There is thinning of the posterior corpus callosum likely representing wallerian degeneration associated with this infarct.  No hemorrhage or mass lesion is evident. Mild generalized atrophy is present. The ventricles are proportionate to the degree of atrophy. No significant extra-axial fluid collection is present.  Flow is present in the major intracranial arteries. The paranasal sinuses are clear. There may be some fluid in the ethmoid air cells.  IMPRESSION: 1. Remote posterior left frontal lobe infarct. 2. No acute intracranial abnormality. 3. Mild generalized atrophy. 4. The study is moderately degraded by motion, diminishing sensitivity for small lesions.   Electronically Signed   By: Lawrence Santiago M.D.   On: 05/31/2013 11:35    I have reviewed the patient's current medications.  Assessment/Plan: Problem #1 acute kidney injury:. Her creatinine is 1.57  renal function is stable. 00 no Problem #2 respiratory failure: Has resolved. Problem #3 hypokalemia: Potassium has corrected Problem #4 septic shock: Her blood pressure is better Problem #5 anemia: Her hemoglobin and hematocrit is stable. Problem #6 encephalopathy: Patient seems more confused and occassionaly with fixed gaze . Patient also seems to be talking family members which are not present. Problem #7 paraplegia Problem #8 metabolic bone disease calcium and phosphorus is with in range. Plan:continue with present manegement We'll DC potassium supplement. We'll check her basic metabolic panel in the morning.   LOS: 11 days   Nolawi Kanady S 06/01/2013,7:33 AM

## 2013-06-01 NOTE — Progress Notes (Signed)
Although she is still confused she is doing pretty well from a respiratory standpoint. Her renal function has improved markedly. I will plan to follow somewhat more peripherally but will be glad to be more actively involved if needed  Thanks for allowing me to see her with you
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
## 2013-06-01 NOTE — Progress Notes (Signed)
Subjective: Patient still confused and disoriented. No fever or chills.  Objective: Vital signs in last 24 hours: Temp:  [97.9 F (36.6 C)-98.8 F (37.1 C)] 98.8 F (37.1 C) (01/06 0453) Pulse Rate:  [85-100] 85 (01/06 0453) Resp:  [20] 20 (01/06 0453) BP: (130-163)/(74-91) 163/91 mmHg (01/06 0453) SpO2:  [92 %-100 %] 94 % (01/06 0751) Weight:  [79.9 kg (176 lb 2.4 oz)] 79.9 kg (176 lb 2.4 oz) (01/06 0453) Weight change: -2.7 kg (-5 lb 15.2 oz) Last BM Date: 05/30/13  Intake/Output from previous day: 01/05 0701 - 01/06 0700 In: 240 [P.O.:240] Out: 2900 [Urine:2900]  PHYSICAL EXAM General appearance: moderate distress and slowed mentation Resp: diminished breath sounds bilaterally and rhonchi bilaterally Cardio: S1, S2 normal GI: soft, non-tender; bowel sounds normal; no masses,  no organomegaly Extremities: paraplegic and deformed lower extremities  Lab Results:    @labtest @ [facility] No results found for this basename: PHART, PCO2, PO2ART, TCO2, HCO3,  in the last 72 hours CULTURES Recent Results (from the past 240 hour(s))  URINE CULTURE     Status: None   Collection Time    05/29/13 10:42 AM      Result Value Range Status   Specimen Description URINE, SUPRAPUBIC   Final   Special Requests rocephin   Final   Culture  Setup Time     Final   Value: 05/29/2013 21:00     Performed at [facility]     Final   Value: NO GROWTH     Performed at [facility]   Culture     Final   Value: NO GROWTH     Performed at [facility]   Report Status 05/30/2013 FINAL   Final  CLOSTRIDIUM DIFFICILE BY PCR     Status: None   Collection Time    05/29/13 10:49 AM      Result Value Range Status   C difficile by pcr NEGATIVE  NEGATIVE Final   Studies/Results: Mr Brain Wo Contrast  05/31/2013   CLINICAL DATA:  Altered mental status.  Confusion.  EXAM: MRI HEAD WITHOUT CONTRAST  TECHNIQUE: Multiplanar, multiecho pulse sequences of the brain and  surrounding structures were obtained without intravenous contrast.  COMPARISON:  CT head without contrast 05/21/2013  FINDINGS: The study is moderately degraded by patient motion on all sequences.  The diffusion-weighted images demonstrate no evidence for acute or subacute infarction. 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 There is thinning of the posterior corpus callosum likely representing wallerian degeneration associated with this infarct.  No hemorrhage or mass lesion is evident. Mild generalized atrophy is present. The ventricles are proportionate to the degree of atrophy. No significant extra-axial fluid collection is present.  Flow is present in the major intracranial arteries. The paranasal sinuses are clear. There may be some fluid in the ethmoid air cells.  IMPRESSION: 1. Remote posterior left frontal lobe infarct. 2. No acute intracranial abnormality. 3. Mild generalized atrophy. 4. The study is moderately degraded by motion, diminishing sensitivity for small lesions.   Electronically Signed   By: Lawrence Santiago M.D.   On: 05/31/2013 11:35    Medications: I have reviewed the patient's current medications.  Assesment: Active Problems:   Paraplegia   Pyelonephritis, acute   Respiratory failure, acute   Sepsis   Encephalopathy acute   Acute renal failure   Hyperkalemia   Hypothermia S/P extubation  Plan: Medications reviewed Continue supportive care As per neurology recommendation  Continue current treatment    LOS: 11 days   Darienne Belleau 06/01/2013, 8:25 AM

## 2013-06-01 NOTE — Progress Notes (Signed)
NAMEMAREKA, [name]            ACCOUNT NO.:  [id number]  MEDICAL RECORD NO.:  [id number]  LOCATION:  EE                           FACILITY:  [facility]  PHYSICIAN:  Lummie Montijo L. Luan Pulling, M.D.DATE OF BIRTH:  09-29-1965  DATE OF PROCEDURE: DATE OF DISCHARGE:  05/31/2013                                PROGRESS NOTE   HISTORY OF PRESENT ILLNESS:  [name] is still confused.  I had started her back on anxiety medications and thought that some of her confusion may have been a withdrawal effect.  Otherwise, she is doing better.  She has no new complaints.  Her breathing is apparently doing okay.  Her heart is regular.  No other new problems have been noted.  PHYSICAL EXAMINATION:  VITAL SIGNS:  As recorded. CHEST:  Pretty clear now. HEART:  Regular. ABDOMEN:  Soft. EXTREMITIES:  She has paraplegia chronically.  She still has some edema.  ASSESSMENT:  She is generally better, but still confused.  PLAN:  Dr. Legrand Rams has asked for Dr. Merlene Laughter to re-evaluate her, which I think is appropriate.  In the meantime, continue with treatments.  I will plan to follow more peripherally now.     Tiah Heckel L. Luan Pulling, M.D.     ELH/MEDQ  D:  05/31/2013  T:  06/01/2013  Job:  [id number]

## 2013-06-01 NOTE — Clinical Social Work Note (Signed)
CSW met with son at bedside and talked with pt's sister on phone. They have discussed options and decided to take pt home and work with [facility] to place pt closer to end of month due to new Medicaid guidelines. CSW verified with Claiborne Billings at facility that she would assist family if they decided to pursue placement later this month. Pt has CAP aid and supportive family. They plan to be with pt around the clock. Juanita, pt's sister reports that pt now has a hospital bed at home. CM notified of decision. CSW will sign off but can be reconsulted if needed.  Benay Pike, [place]

## 2013-06-01 NOTE — Plan of Care (Signed)
Pt SPO2 level checked and was 97% on RA, per D. [place]onquah's request

## 2013-06-02 LAB — BLOOD GAS, ARTERIAL
Acid-base deficit: 2 mmol/L (ref 0.0–2.0)
Bicarbonate: 21.6 mEq/L (ref 20.0–24.0)
Drawn by: 27407
FIO2: 0.21 %
O2 Saturation: 92.9 %
Patient temperature: 37
TCO2: 20.3 mmol/L (ref 0–100)
pCO2 arterial: 32.7 mmHg — ABNORMAL LOW (ref 35.0–45.0)
pH, Arterial: 7.436 (ref 7.350–7.450)
pO2, Arterial: 64.6 mmHg — ABNORMAL LOW (ref 80.0–100.0)

## 2013-06-02 LAB — BASIC METABOLIC PANEL
BUN: 11 mg/dL (ref 6–23)
CHLORIDE: 106 meq/L (ref 96–112)
CO2: 23 mEq/L (ref 19–32)
CREATININE: 1.52 mg/dL — AB (ref 0.50–1.10)
Calcium: 8.5 mg/dL (ref 8.4–10.5)
GFR calc Af Amer: 47 mL/min — ABNORMAL LOW (ref >90)
GFR calc non Af Amer: 40 mL/min — ABNORMAL LOW (ref >90)
GLUCOSE: 85 mg/dL (ref 70–99)
POTASSIUM: 3.3 meq/L — AB (ref 3.7–5.3)
Sodium: 143 mEq/L (ref 137–147)

## 2013-06-02 LAB — CBC
HCT: 26.2 % — ABNORMAL LOW (ref 36.0–46.0)
Hemoglobin: 8.6 g/dL — ABNORMAL LOW (ref 12.0–15.0)
MCH: 29.9 pg (ref 26.0–34.0)
MCHC: 32.8 g/dL (ref 30.0–36.0)
MCV: 91 fL (ref 78.0–100.0)
Platelets: 382 10*3/uL (ref 150–400)
RBC: 2.88 MIL/uL — ABNORMAL LOW (ref 3.87–5.11)
RDW: 15.6 % — ABNORMAL HIGH (ref 11.5–15.5)
WBC: 8.4 10*3/uL (ref 4.0–10.5)

## 2013-06-02 MED ORDER — ZOLPIDEM TARTRATE 5 MG PO TABS
10.0000 mg | ORAL_TABLET | Freq: Every evening | ORAL | Status: DC | PRN
  Administered 2013-06-03: 10 mg via ORAL
  Filled 2013-06-02: qty 2

## 2013-06-02 MED ORDER — QUETIAPINE FUMARATE 25 MG PO TABS
150.0000 mg | ORAL_TABLET | Freq: Every day | ORAL | Status: DC
  Administered 2013-06-03 – 2013-06-04 (×2): 150 mg via ORAL
  Filled 2013-06-02: qty 2
  Filled 2013-06-02 (×2): qty 1
  Filled 2013-06-02: qty 2

## 2013-06-02 NOTE — Progress Notes (Signed)
Subjective: Patient remained confused , agitated and disoriented. No change in her mental status. Patient being evaluated by neurology  Objective: Vital signs in last 24 hours: Temp:  [97.3 F (36.3 C)-98.5 F (36.9 C)] 97.3 F (36.3 C) (01/07 0540) Pulse Rate:  [94-113] 94 (01/07 0540) Resp:  [20] 20 (01/07 0540) BP: (139-160)/(84-95) 145/88 mmHg (01/07 0540) SpO2:  [94 %-97 %] 94 % (01/07 0741) Weight:  [79.8 kg (175 lb 14.8 oz)] 79.8 kg (175 lb 14.8 oz) (01/07 0540) Weight change: -0.1 kg (-3.5 oz) Last BM Date: 05/31/13  Intake/Output from previous day: 01/06 0701 - 01/07 0700 In: 720 [P.O.:720] Out: 1925 [Urine:1925]  PHYSICAL EXAM General appearance: moderate distress and slowed mentation Resp: diminished breath sounds bilaterally and rhonchi bilaterally Cardio: S1, S2 normal GI: soft, non-tender; bowel sounds normal; no masses,  no organomegaly Extremities: paraplegic and deformed lower extremities  Lab Results:    @labtest @ [facility] No results found for this basename: PHART, PCO2, PO2ART, TCO2, HCO3,  in the last 72 hours CULTURES Recent Results (from the past 240 hour(s))  URINE CULTURE     Status: None   Collection Time    05/29/13 10:42 AM      Result Value Range Status   Specimen Description URINE, SUPRAPUBIC   Final   Special Requests rocephin   Final   Culture  Setup Time     Final   Value: 05/29/2013 21:00     Performed at [facility]     Final   Value: NO GROWTH     Performed at [facility]   Culture     Final   Value: NO GROWTH     Performed at [facility]   Report Status 05/30/2013 FINAL   Final  CLOSTRIDIUM DIFFICILE BY PCR     Status: None   Collection Time    05/29/13 10:49 AM      Result Value Range Status   C difficile by pcr NEGATIVE  NEGATIVE Final   Studies/Results: Mr Brain Wo Contrast  05/31/2013   CLINICAL DATA:  Altered mental status.  Confusion.  EXAM: MRI HEAD WITHOUT CONTRAST   TECHNIQUE: Multiplanar, multiecho pulse sequences of the brain and surrounding structures were obtained without intravenous contrast.  COMPARISON:  CT head without contrast 05/21/2013  FINDINGS: The study is moderately degraded by patient motion on all sequences.  The diffusion-weighted images demonstrate no evidence for acute or subacute infarction. 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 There is thinning of the posterior corpus callosum likely representing wallerian degeneration associated with this infarct.  No hemorrhage or mass lesion is evident. Mild generalized atrophy is present. The ventricles are proportionate to the degree of atrophy. No significant extra-axial fluid collection is present.  Flow is present in the major intracranial arteries. The paranasal sinuses are clear. There may be some fluid in the ethmoid air cells.  IMPRESSION: 1. Remote posterior left frontal lobe infarct. 2. No acute intracranial abnormality. 3. Mild generalized atrophy. 4. The study is moderately degraded by motion, diminishing sensitivity for small lesions.   Electronically Signed   By: Lawrence Santiago M.D.   On: 05/31/2013 11:35    Medications: I have reviewed the patient's current medications.  Assesment: Active Problems:   Paraplegia   Pyelonephritis, acute   Respiratory failure, acute   Sepsis   Encephalopathy acute   Acute renal failure   Hyperkalemia   Hypothermia S/P extubation  Plan: Medications reviewed  Continue supportive care As per neurology recommendation Continue current treatment    LOS: 12 days   [name] 06/02/2013, 7:49 AM

## 2013-06-02 NOTE — Procedures (Signed)
[place] A. Merlene Laughter, MD     [email or web address]         NAMEMIAJA, NORCOTT            ACCOUNT NO.:  [id number]  MEDICAL RECORD NO.:  [id number]  LOCATION:  EE                           FACILITY:  [facility]  PHYSICIAN:  Lawanda Holzheimer A. Merlene Laughter, M.D. DATE OF BIRTH:  [birth year]  DATE OF PROCEDURE: DATE OF DISCHARGE:  05/31/2013                             EEG INTERPRETATION   INDICATIONS:  This is a 48 year old female, who has developed altered mental status, hallucinations and confusion.  The study is being done to evaluate for seizure activity.  Previous EEG showed some epileptiform discharges.  MEDICATIONS:  Ceftriaxone, biotin, enoxaparin, albuterol, Ativan, Protonix, potassium.  ANALYSIS:  A 16-channel recording using standard 10/20 measurements is conducted for 21 minutes.  There is a low voltage posterior dominant rhythm of 15 hertz, which attenuates with eye opening.  There is beta activity observed in the frontal areas, recorded is moderately degraded by movement and myogenic artifact seen throughout the recording.  Photic stimulation and hyperventilation were not carried out.  There is no focal or lateralized slowing.  There is no epileptiform activity observed.  IMPRESSION:  Somewhat limited recording, but no epileptiform discharges. Essentially unremarkable recording.     Deandrea Vanpelt A. Merlene Laughter, M.D.     KAD/MEDQ  D:  06/02/2013  T:  06/02/2013  Job:  [id number]

## 2013-06-02 NOTE — Progress Notes (Signed)
Patient ID: [name], female   DOB: 10/20/1965, 48 y.o.   MRN: [id number]  Christina A. Merlene Laughter, MD     [email or web address]          [name] is an 48 y.o. female.   Assessment/Plan: Acute/subacute delirium without underlying metabolic derangements. I believe the patient's delirium is most likely due to psychosis aggravated by severe insomnia. The patient's neuroleptics will be adjusted where the circle will be increased. When necessary doses of Ambien will also be added. We will continue with the Haldol.  Nurses report that the patient has not slept in 3 days. This is corroborated by her family. She also again did not sleep last night. The nurses report that she is intermittently confused and agitated with significant hallucinations as before.  She is awake and alert but continues to have significant confusion. She is talking to people who were not around. She has significant visual hallucinations and a somewhat irritated and agitated at times. She often thinks that she is at home. At times she is oriented to an apparent hospital however. She follows commands. Again she has good strength of the upper extremities. She does have some shaking/clonic activity of the left lower extremity which she stated that she had from time to time.    Objective: Vital signs in last 24 hours: Temp:  [97.3 F (36.3 C)-98.2 F (36.8 C)] 97.6 F (36.4 C) (01/07 1500) Pulse Rate:  [94-113] 94 (01/07 1500) Resp:  [20] 20 (01/07 1500) BP: (129-160)/(77-95) 129/77 mmHg (01/07 1500) SpO2:  [92 %-96 %] 92 % (01/07 1500) Weight:  [79.8 kg (175 lb 14.8 oz)] 79.8 kg (175 lb 14.8 oz) (01/07 0540)  Intake/Output from previous day: 01/06 0701 - 01/07 0700 In: 720 [P.O.:720] Out: 1925 [Urine:1925] Intake/Output this shift:   Nutritional status: General   Lab Results: Results for orders placed during the hospital encounter of 05/21/13 (from the past 48 hour(s))  BASIC  METABOLIC PANEL     Status: Abnormal   Collection Time    06/01/13  4:45 AM      Result Value Range   Sodium 143  137 - 147 mEq/L   Potassium 4.9  3.7 - 5.3 mEq/L   Comment: DELTA CHECK NOTED   Chloride 107  96 - 112 mEq/L   CO2 23  19 - 32 mEq/L   Glucose, Bld 86  70 - 99 mg/dL   BUN 7  6 - 23 mg/dL   Creatinine, Ser 1.57 (*) 0.50 - 1.10 mg/dL   Calcium 8.8  8.4 - 10.5 mg/dL   GFR calc non Af Amer 39 (*) >90 mL/min   GFR calc Af Amer 45 (*) >90 mL/min   Comment: (NOTE)     The eGFR has been calculated using the CKD EPI equation.     This calculation has not been validated in all clinical situations.     eGFR's persistently <90 mL/min signify possible Chronic Kidney     Disease.  BASIC METABOLIC PANEL     Status: Abnormal   Collection Time    06/02/13  4:47 AM      Result Value Range   Sodium 143  137 - 147 mEq/L   Potassium 3.3 (*) 3.7 - 5.3 mEq/L   Comment: DELTA CHECK NOTED   Chloride 106  96 - 112 mEq/L   CO2 23  19 - 32 mEq/L   Glucose, Bld 85  70 - 99 mg/dL   BUN 11  6 -  23 mg/dL   Creatinine, Ser 1.52 (*) 0.50 - 1.10 mg/dL   Calcium 8.5  8.4 - 10.5 mg/dL   GFR calc non Af Amer 40 (*) >90 mL/min   GFR calc Af Amer 47 (*) >90 mL/min   Comment: (NOTE)     The eGFR has been calculated using the CKD EPI equation.     This calculation has not been validated in all clinical situations.     eGFR's persistently <90 mL/min signify possible Chronic Kidney     Disease.  BLOOD GAS, ARTERIAL     Status: Abnormal   Collection Time    06/02/13  9:31 AM      Result Value Range   FIO2 0.21     pH, Arterial 7.436  7.350 - 7.450   pCO2 arterial 32.7 (*) 35.0 - 45.0 mmHg   pO2, Arterial 64.6 (*) 80.0 - 100.0 mmHg   Bicarbonate 21.6  20.0 - 24.0 mEq/L   TCO2 20.3  0 - 100 mmol/L   Acid-base deficit 2.0  0.0 - 2.0 mmol/L   O2 Saturation 92.9     Patient temperature 37.0     Collection site LEFT RADIAL     Drawn by [id number]     Sample type ARTERIAL DRAW     Allens test (pass/fail)  PASS  PASS  CBC     Status: Abnormal   Collection Time    06/02/13 10:06 AM      Result Value Range   WBC 8.4  4.0 - 10.5 K/uL   RBC 2.88 (*) 3.87 - 5.11 MIL/uL   Hemoglobin 8.6 (*) 12.0 - 15.0 g/dL   HCT 26.2 (*) 36.0 - 46.0 %   MCV 91.0  78.0 - 100.0 fL   MCH 29.9  26.0 - 34.0 pg   MCHC 32.8  30.0 - 36.0 g/dL   RDW 15.6 (*) 11.5 - 15.5 %   Platelets 382  150 - 400 K/uL    Lipid Panel No results found for this basename: CHOL, TRIG, HDL, CHOLHDL, VLDL, LDLCALC,  in the last 72 hours  Studies/Results: No results found.  Medications:  Scheduled Meds: . antiseptic oral rinse  15 mL Mouth Rinse QID  . cefTRIAXone (ROCEPHIN)  IV  1 g Intravenous Q24H  . enoxaparin (LOVENOX) injection  40 mg Subcutaneous Q24H  . feeding supplement (PRO-STAT SUGAR FREE 64)  30 mL Oral TID WC  . haloperidol  1 mg Oral q morning - 10a  . ipratropium-albuterol  3 mL Nebulization TID  . pantoprazole (PROTONIX) IV  40 mg Intravenous QHS  . QUEtiapine  50 mg Oral QHS  . sodium chloride  10-40 mL Intracatheter Q12H   Continuous Infusions: . dextrose 5 % and 0.9% NaCl 100 mL/hr at 05/30/13 1642   PRN Meds:.sodium chloride     LOS: 12 days   [name], M.D.  Diplomate, Tax adviser of Psychiatry and Neurology ( Neurology).
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
## 2013-06-03 LAB — CBC
HCT: 25.7 % — ABNORMAL LOW (ref 36.0–46.0)
Hemoglobin: 8.4 g/dL — ABNORMAL LOW (ref 12.0–15.0)
MCH: 29.7 pg (ref 26.0–34.0)
MCHC: 32.7 g/dL (ref 30.0–36.0)
MCV: 90.8 fL (ref 78.0–100.0)
Platelets: 343 10*3/uL (ref 150–400)
RBC: 2.83 MIL/uL — ABNORMAL LOW (ref 3.87–5.11)
RDW: 15.6 % — ABNORMAL HIGH (ref 11.5–15.5)
WBC: 7.1 10*3/uL (ref 4.0–10.5)

## 2013-06-03 LAB — BASIC METABOLIC PANEL
BUN: 12 mg/dL (ref 6–23)
CO2: 23 mEq/L (ref 19–32)
Calcium: 8 mg/dL — ABNORMAL LOW (ref 8.4–10.5)
Chloride: 107 mEq/L (ref 96–112)
Creatinine, Ser: 1.33 mg/dL — ABNORMAL HIGH (ref 0.50–1.10)
GFR calc Af Amer: 55 mL/min — ABNORMAL LOW (ref >90)
GFR calc non Af Amer: 47 mL/min — ABNORMAL LOW (ref >90)
Glucose, Bld: 92 mg/dL (ref 70–99)
Potassium: 2.8 mEq/L — CL (ref 3.7–5.3)
Sodium: 144 mEq/L (ref 137–147)

## 2013-06-03 MED ORDER — KCL IN DEXTROSE-NACL 20-5-0.45 MEQ/L-%-% IV SOLN
INTRAVENOUS | Status: DC
  Administered 2013-06-03: 17:00:00 via INTRAVENOUS

## 2013-06-03 MED ORDER — POTASSIUM CHLORIDE CRYS ER 20 MEQ PO TBCR
40.0000 meq | EXTENDED_RELEASE_TABLET | Freq: Once | ORAL | Status: AC
  Administered 2013-06-03: 40 meq via ORAL
  Filled 2013-06-03: qty 2

## 2013-06-03 MED ORDER — ALBUTEROL SULFATE (2.5 MG/3ML) 0.083% IN NEBU: 2.5000 mg | INHALATION_SOLUTION | Freq: Four times a day (QID) | RESPIRATORY_TRACT | Status: DC | PRN

## 2013-06-03 MED ORDER — PANTOPRAZOLE SODIUM 40 MG PO TBEC
40.0000 mg | DELAYED_RELEASE_TABLET | Freq: Every day | ORAL | Status: DC
  Administered 2013-06-04: 40 mg via ORAL
  Filled 2013-06-03: qty 1

## 2013-06-03 MED ORDER — KCL IN DEXTROSE-NACL 20-5-0.45 MEQ/L-%-% IV SOLN: INTRAVENOUS | Status: DC

## 2013-06-03 MED ORDER — POTASSIUM CHLORIDE 10 MEQ/100ML IV SOLN
10.0000 meq | INTRAVENOUS | Status: AC
  Administered 2013-06-03 (×5): 10 meq via INTRAVENOUS
  Filled 2013-06-03: qty 100

## 2013-06-03 NOTE — Progress Notes (Signed)
Patient ID: [name], female   DOB: 1966/03/05, 48 y.o.   MRN: [id number]  Jay A. Merlene Laughter, MD     [email or web address]          [name] is an 48 y.o. female.   Assessment/Plan: Acute delirium of unclear etiology. Suspect psychosis aggravated with insomnia. We will continue to follow patient and make appropriate adjustments.  The patient is reported to be still confused. Some clear how well slept last night.  Today she is a lot less agitated. She is still disoriented. She thinks that she is at home watching her soap operas. The patient does follow commands briskly. She is noted to have some visual hallucinations. Again she moves upper extremities well.      Objective: Vital signs in last 24 hours: Temp:  [97.6 F (36.4 C)-98.1 F (36.7 C)] 98.1 F (36.7 C) (01/08 1400) Pulse Rate:  [89-104] 92 (01/08 1400) Resp:  [20] 20 (01/08 1400) BP: (134-147)/(73-85) 134/85 mmHg (01/08 1400) SpO2:  [96 %-98 %] 98 % (01/08 1400) Weight:  [79.1 kg (174 lb 6.1 oz)] 79.1 kg (174 lb 6.1 oz) (01/08 0500)  Intake/Output from previous day: 01/07 0701 - 01/08 0700 In: 720 [P.O.:720] Out: 1300 [Urine:1300] Intake/Output this shift:   Nutritional status: General   Lab Results: Results for orders placed during the hospital encounter of 05/21/13 (from the past 48 hour(s))  BASIC METABOLIC PANEL     Status: Abnormal   Collection Time    06/02/13  4:47 AM      Result Value Range   Sodium 143  137 - 147 mEq/L   Potassium 3.3 (*) 3.7 - 5.3 mEq/L   Comment: DELTA CHECK NOTED   Chloride 106  96 - 112 mEq/L   CO2 23  19 - 32 mEq/L   Glucose, Bld 85  70 - 99 mg/dL   BUN 11  6 - 23 mg/dL   Creatinine, Ser 1.52 (*) 0.50 - 1.10 mg/dL   Calcium 8.5  8.4 - 10.5 mg/dL   GFR calc non Af Amer 40 (*) >90 mL/min   GFR calc Af Amer 47 (*) >90 mL/min   Comment: (NOTE)     The eGFR has been calculated using the CKD EPI equation.     This calculation has not been  validated in all clinical situations.     eGFR's persistently <90 mL/min signify possible Chronic Kidney     Disease.  BLOOD GAS, ARTERIAL     Status: Abnormal   Collection Time    06/02/13  9:31 AM      Result Value Range   FIO2 0.21     pH, Arterial 7.436  7.350 - 7.450   pCO2 arterial 32.7 (*) 35.0 - 45.0 mmHg   pO2, Arterial 64.6 (*) 80.0 - 100.0 mmHg   Bicarbonate 21.6  20.0 - 24.0 mEq/L   TCO2 20.3  0 - 100 mmol/L   Acid-base deficit 2.0  0.0 - 2.0 mmol/L   O2 Saturation 92.9     Patient temperature 37.0     Collection site LEFT RADIAL     Drawn by [phone]     Sample type ARTERIAL DRAW     Allens test (pass/fail) PASS  PASS  CBC     Status: Abnormal   Collection Time    06/02/13 10:06 AM      Result Value Range   WBC 8.4  4.0 - 10.5 K/uL   RBC 2.88 (*) 3.87 - 5.11 MIL/uL  Hemoglobin 8.6 (*) 12.0 - 15.0 g/dL   HCT 26.2 (*) 36.0 - 46.0 %   MCV 91.0  78.0 - 100.0 fL   MCH 29.9  26.0 - 34.0 pg   MCHC 32.8  30.0 - 36.0 g/dL   RDW 15.6 (*) 11.5 - 15.5 %   Platelets 382  150 - 400 K/uL  CULTURE, BLOOD (ROUTINE X 2)     Status: None   Collection Time    06/02/13 10:06 AM      Result Value Range   Specimen Description BLOOD LEFT HAND     Special Requests BOTTLES DRAWN AEROBIC AND ANAEROBIC 6CC     Culture NO GROWTH 1 DAY     Report Status PENDING    CULTURE, BLOOD (ROUTINE X 2)     Status: None   Collection Time    06/02/13 10:06 AM      Result Value Range   Specimen Description BLOOD LEFT ANTECUBITAL     Special Requests       Value: BOTTLES DRAWN AEROBIC AND ANAEROBIC AEB=8CC ANA=6CC   Culture NO GROWTH 1 DAY     Report Status PENDING    CBC     Status: Abnormal   Collection Time    06/03/13  5:22 AM      Result Value Range   WBC 7.1  4.0 - 10.5 K/uL   RBC 2.83 (*) 3.87 - 5.11 MIL/uL   Hemoglobin 8.4 (*) 12.0 - 15.0 g/dL   HCT 25.7 (*) 36.0 - 46.0 %   MCV 90.8  78.0 - 100.0 fL   MCH 29.7  26.0 - 34.0 pg   MCHC 32.7  30.0 - 36.0 g/dL   RDW 15.6 (*) 11.5 -  15.5 %   Platelets 343  150 - 400 K/uL  BASIC METABOLIC PANEL     Status: Abnormal   Collection Time    06/03/13  5:22 AM      Result Value Range   Sodium 144  137 - 147 mEq/L   Potassium 2.8 (*) 3.7 - 5.3 mEq/L   Comment: CRITICAL RESULT CALLED TO, READ BACK BY AND VERIFIED WITH:     FORTE.L AT 6:50AM ON 06/03/13 BY FESTERMAN,C   Chloride 107  96 - 112 mEq/L   CO2 23  19 - 32 mEq/L   Glucose, Bld 92  70 - 99 mg/dL   BUN 12  6 - 23 mg/dL   Creatinine, Ser 1.33 (*) 0.50 - 1.10 mg/dL   Calcium 8.0 (*) 8.4 - 10.5 mg/dL   GFR calc non Af Amer 47 (*) >90 mL/min   GFR calc Af Amer 55 (*) >90 mL/min   Comment: (NOTE)     The eGFR has been calculated using the CKD EPI equation.     This calculation has not been validated in all clinical situations.     eGFR's persistently <90 mL/min signify possible Chronic Kidney     Disease.    Lipid Panel No results found for this basename: CHOL, TRIG, HDL, CHOLHDL, VLDL, LDLCALC,  in the last 72 hours  Studies/Results: No results found.  Medications:     LOS: 13 days    A. Merlene Laughter, M.D.  Diplomate, Tax adviser of Psychiatry and Neurology ( Neurology).

## 2013-06-03 NOTE — Progress Notes (Signed)
After giving Ambien, patient continued to be restless throughout the night.  Patient did not talk out as much as the night before but was constantly moving in the bed and throwing pillows on the floor.  Upon assessment this morning, patient was only oriented to self and continue to have hallucinations. Will continue to monitor.  Clayborn Bigness 06/03/2013

## 2013-06-03 NOTE — Progress Notes (Signed)
The patient is receiving Protonix by the intravenous route.  Based on criteria approved by the Pharmacy and [place], the medication is being converted to the equivalent oral dose form.  These criteria include: -No Active GI bleeding -Able to tolerate diet of full liquids (or better) or tube feeding OR able to tolerate other medications by the oral or enteral route  If you have any questions about this conversion, please contact the Pharmacy Department (ext [phone]).  Thank you.  Ena Dawley, [facility] 06/03/2013 12:24 PM

## 2013-06-03 NOTE — Progress Notes (Signed)
Subjective: Interval History: patient remained confused. Presently seems more calm  Objective: Vital signs in last 24 hours: Temp:  [97.6 F (36.4 C)-98 F (36.7 C)] 98 F (36.7 C) (01/08 0500) Pulse Rate:  [89-104] 104 (01/08 0500) Resp:  [20] 20 (01/08 0500) BP: (129-147)/(73-79) 147/73 mmHg (01/08 0500) SpO2:  [92 %-96 %] 96 % (01/08 0742) Weight:  [79.1 kg (174 lb 6.1 oz)] 79.1 kg (174 lb 6.1 oz) (01/08 0500) Weight change: -0.7 kg (-1 lb 8.7 oz)  Intake/Output from previous day: 01/07 0701 - 01/08 0700 In: 720 [P.O.:720] Out: 1300 [Urine:1300] Intake/Output this shift:    General appearance: alert, cooperative and no distress Resp: clear to auscultation bilaterally Cardio: regular rate and rhythm, S1, S2 normal, no murmur, click, rub or gallop GI: soft, non-tender; bowel sounds normal; no masses,  no organomegaly Extremities: extremities normal, atraumatic, no cyanosis or edema  Lab Results:  Recent Labs  06/02/13 1006 06/03/13 0522  WBC 8.4 7.1  HGB 8.6* 8.4*  HCT 26.2* 25.7*  PLT 382 343   BMET:   Recent Labs  06/02/13 0447 06/03/13 0522  NA 143 144  K 3.3* 2.8*  CL 106 107  CO2 23 23  GLUCOSE 85 92  BUN 11 12  CREATININE 1.52* 1.33*  CALCIUM 8.5 8.0*   No results found for this basename: PTH,  in the last 72 hours Iron Studies: No results found for this basename: IRON, TIBC, TRANSFERRIN, FERRITIN,  in the last 72 hours  Studies/Results: No results found.  I have reviewed the patient's current medications.  Assessment/Plan: Problem #1 acute kidney injury:. Her creatinine is 1.33  renal function is continue to improve Problem #2 respiratory failure: Has resolved. Problem #3 hypokalemia: Potassium has declined Problem #4 septic shock: Her blood pressure is better Problem #5 anemia: Her hemoglobin and hematocrit is stable. Problem #6 encephalopathy: Patient seems more confused and no significant change Problem #7 paraplegia Problem #8  metabolic bone disease calcium and phosphorus is with in range. Plan:continue with present manegement We'll change her ivf to d51/2ns with kcl 20 meq at 75 cc/hr We'll check her basic metabolic panel in the morning.   LOS: 13 days   Muskan Bolla S 06/03/2013,10:17 AM

## 2013-06-03 NOTE — Progress Notes (Signed)
CRITICAL VALUE ALERT  Critical value received:  K+ 2.8  Date of notification:  06/03/2013   Time of notification:  06:50  Critical value read back:yes  Nurse who received alert:  Jovita Kussmaul  MD notified (1st page):  Anastasio Champion  Time of first page:  06:55  MD notified (2nd page):  Time of second page:  Responding MD:  Anastasio Champion  Time MD responded:  07:00

## 2013-06-03 NOTE — Progress Notes (Signed)
Subjective: Patient is confused and disoriented. EEG was negative for seizure. No fever or chills.  Objective: Vital signs in last 24 hours: Temp:  [97.6 F (36.4 C)-98 F (36.7 C)] 98 F (36.7 C) (01/08 0500) Pulse Rate:  [89-104] 104 (01/08 0500) Resp:  [20] 20 (01/08 0500) BP: (129-147)/(73-79) 147/73 mmHg (01/08 0500) SpO2:  [92 %-96 %] 96 % (01/08 0742) Weight:  [79.1 kg (174 lb 6.1 oz)] 79.1 kg (174 lb 6.1 oz) (01/08 0500) Weight change: -0.7 kg (-1 lb 8.7 oz) Last BM Date: 06/03/13  Intake/Output from previous day: 01/07 0701 - 01/08 0700 In: 720 [P.O.:720] Out: 1300 [Urine:1300]  PHYSICAL EXAM General appearance: moderate distress and slowed mentation Resp: diminished breath sounds bilaterally and rhonchi bilaterally Cardio: S1, S2 normal GI: soft, non-tender; bowel sounds normal; no masses,  no organomegaly Extremities: paraplegic and deformed lower extremities  Lab Results:    @labtest @ [facility]  Recent Labs  06/02/13 0931  PHART 7.436  PO2ART 64.6*  TCO2 20.3  HCO3 21.6   CULTURES Recent Results (from the past 240 hour(s))  URINE CULTURE     Status: None   Collection Time    05/29/13 10:42 AM      Result Value Range Status   Specimen Description URINE, SUPRAPUBIC   Final   Special Requests rocephin   Final   Culture  Setup Time     Final   Value: 05/29/2013 21:00     Performed at [place]     Final   Value: NO GROWTH     Performed at [facility]   Culture     Final   Value: NO GROWTH     Performed at [facility]   Report Status 05/30/2013 FINAL   Final  CLOSTRIDIUM DIFFICILE BY PCR     Status: None   Collection Time    05/29/13 10:49 AM      Result Value Range Status   C difficile by pcr NEGATIVE  NEGATIVE Final   Studies/Results: No results found.  Medications: I have reviewed the patient's current medications.  Assesment: Active Problems:   Paraplegia   Pyelonephritis, acute  Respiratory failure, acute   Sepsis   Encephalopathy acute   Acute renal failure   Hyperkalemia   Hypothermia S/P extubation Hypokalemia  Plan: Medications reviewed Continue supportive care Will supplement KCL Continue current treatment    LOS: 13 days   [name] 06/03/2013, 7:51 AM

## 2013-06-04 LAB — BASIC METABOLIC PANEL
BUN: 10 mg/dL (ref 6–23)
CO2: 21 mEq/L (ref 19–32)
CREATININE: 1.16 mg/dL — AB (ref 0.50–1.10)
Calcium: 8.2 mg/dL — ABNORMAL LOW (ref 8.4–10.5)
Chloride: 110 mEq/L (ref 96–112)
GFR calc non Af Amer: 56 mL/min — ABNORMAL LOW (ref >90)
GFR, EST AFRICAN AMERICAN: 65 mL/min — AB (ref >90)
Glucose, Bld: 92 mg/dL (ref 70–99)
POTASSIUM: 3.7 meq/L (ref 3.7–5.3)
Sodium: 144 mEq/L (ref 137–147)

## 2013-06-04 NOTE — Discharge Summary (Signed)
Physician Discharge Summary  Patient ID: [name] MRN: [id number] DOB/AGE: January 30, 1966 48 y.o. Primary Care Physician:Coretta Leisey, MD Admit date: 05/21/2013 Discharge date: 06/04/2013    Discharge Diagnoses:   Active Problems:   Paraplegia   Pyelonephritis, acute   Respiratory failure, acute   Sepsis   Encephalopathy acute   Acute renal failure   Hyperkalemia   Hypothermia S/P extubation  Hypokalemia encaphalopathy    Medication List         baclofen 20 MG tablet  Commonly known as:  LIORESAL  Take 40 mg by mouth 4 (four) times daily.     dantrolene 25 MG capsule  Commonly known as:  DANTRIUM  Take 25 mg by mouth daily.     diazepam 5 MG tablet  Commonly known as:  VALIUM  Take 5 mg by mouth every 12 (twelve) hours as needed. For anxiety     docusate sodium 100 MG capsule  Commonly known as:  COLACE  Take 100 mg by mouth at bedtime.     fesoterodine 4 MG Tb24 tablet  Commonly known as:  TOVIAZ  Take 1 tablet (4 mg total) by mouth daily.     gabapentin 300 MG capsule  Commonly known as:  NEURONTIN  Take 300 mg by mouth 2 (two) times daily.     HYDROcodone-acetaminophen 5-325 MG per tablet  Commonly known as:  NORCO/VICODIN  Take 1 tablet by mouth Every 4 hours as needed. Pain.     lidocaine 5 %  Commonly known as:  LIDODERM  Place 1 patch onto the skin daily. Remove & Discard patch within 12 hours or as directed by MD     multivitamin tablet  Take 1 tablet by mouth daily.     naproxen 500 MG tablet  Commonly known as:  NAPROSYN  Take 500 mg by mouth 2 (two) times daily with a meal.        Discharged Condition: stable,     Consults: Pulmonary and Neurology  Significant Diagnostic Studies: Dg Abd 1 View  05/21/2013   CLINICAL DATA:  Abdominal distention.  EXAM: ABDOMEN - 1 VIEW  COMPARISON:  May 21, 2013 CT scan of the abdomen and pelvis  FINDINGS: The study is quite limited due to scatter effects secondary to the patient's body  habitus. The bowel gas pattern rib visualized does not appear abnormal. There may be distention of the urinary bladder but this is not a definite finding. There is a phlebolith in the right aspect of the pelvis. There is a nasogastric tube present whose tip likely lies below the level of the GE junction but the proximal port may lie above it. A chest x-ray would be useful to help determine this further.  IMPRESSION: The study is quite limited due to the patient's body habitus. I cannot exclude distention of the urinary bladder but the findings are not definite. Please see the note above regarding positioning of the nasogastric tube.   Electronically Signed   By: David  Martinique   On: 05/21/2013 19:11   Ct Head Wo Contrast  05/21/2013   CLINICAL DATA:  Unresponsive.  EXAM: CT HEAD WITHOUT CONTRAST  TECHNIQUE: Contiguous axial images were obtained from the base of the skull through the vertex without intravenous contrast.  COMPARISON:  None.  FINDINGS: No mass. No hydrocephalus. No hemorrhage. Lucency noted in the left frontal white matter and cortex most likely from prior stroke. To exclude a significant underlying lesion such as a tumor MRI the brain is suggested.  No acute bony abnormality identified. Air-fluid level in the left maxillary and sphenoid sinus. Sinusitis could present in this fashion. NG tube is noted. Soft tissue swelling in the nasopharynx.  IMPRESSION: 1. Left frontal white matter and cortical lucency most likely from prior stroke, to exclude underlying lesion including tumor MRI the brain can be obtained. 2. Air-fluid level left maxillary and sphenoid sinus. These findings would be consistent with sinusitis. NG tube is noted. Poor aeration of the nasopharynx with soft tissue swelling is noted.   Electronically Signed   By: Marcello Moores  Register   On: 05/21/2013 13:19   Mr Brain Wo Contrast  05/31/2013   CLINICAL DATA:  Altered mental status.  Confusion.  EXAM: MRI HEAD WITHOUT CONTRAST  TECHNIQUE:  Multiplanar, multiecho pulse sequences of the brain and surrounding structures were obtained without intravenous contrast.  COMPARISON:  CT head without contrast 05/21/2013  FINDINGS: The study is moderately degraded by patient motion on all sequences.  The diffusion-weighted images demonstrate no evidence for acute or subacute infarction. 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 There is thinning of the posterior corpus callosum likely representing wallerian degeneration associated with this infarct.  No hemorrhage or mass lesion is evident. Mild generalized atrophy is present. The ventricles are proportionate to the degree of atrophy. No significant extra-axial fluid collection is present.  Flow is present in the major intracranial arteries. The paranasal sinuses are clear. There may be some fluid in the ethmoid air cells.  IMPRESSION: 1. Remote posterior left frontal lobe infarct. 2. No acute intracranial abnormality. 3. Mild generalized atrophy. 4. The study is moderately degraded by motion, diminishing sensitivity for small lesions.   Electronically Signed   By: Lawrence Santiago M.D.   On: 05/31/2013 11:35   US Renal  05/22/2013   CLINICAL DATA:  Rule out stenosis  EXAM: RENAL/URINARY TRACT ULTRASOUND COMPLETE  COMPARISON:  CT 06/21/2012  FINDINGS: Right Kidney:  Length: 10.8 cm in length. The right kidney is markedly irregular. The entire upper pole is heterogeneous. There are hyperechoic and hypoechoic areas. Central mass cannot be excluded. No hydronephrosis.  Left Kidney:  Length: 13.6 cm in length. No hydronephrosis or mass. Irregular upper pole contour.  Bladder:  Foley catheter decompresses the bladder.  IMPRESSION: No hydronephrosis.  The upper pole of the right kidney is markedly irregular underlying central mass cannot be excluded. CT or MR can be performed to further characterize.   Electronically Signed   By: Maryclare Bean M.D.   On: 05/22/2013 14:13   US Carotid Duplex  Bilateral  05/26/2013   CLINICAL DATA:  Frontal infarct.  EXAM: BILATERAL CAROTID DUPLEX ULTRASOUND  TECHNIQUE: Pearline Cables scale imaging, color Doppler and duplex ultrasound were performed of bilateral carotid and vertebral arteries in the neck.  COMPARISON:  CT 05/21/2013.  FINDINGS: Criteria: Quantification of carotid stenosis is based on velocity parameters that correlate the residual internal carotid diameter with NASCET-based stenosis levels, using the diameter of the distal internal carotid lumen as the denominator for stenosis measurement.  The following velocity measurements were obtained:  RIGHT  ICA:  129 cm/sec  CCA:  81 cm/sec  SYSTOLIC ICA/CCA RATIO:  1.6.  DIASTOLIC ICA/CCA RATIO:  1.6.  ECA:  90 cm/sec  LEFT  ICA:  131 cm/sec  CCA:  76 cm/sec  SYSTOLIC ICA/CCA RATIO:  1.7.  DIASTOLIC ICA/CCA RATIO:  1.7.  ECA:  74 cm/sec  RIGHT CAROTID ARTERY: Mild intimal thickening, no hemodynamically significant stenosis .  RIGHT VERTEBRAL ARTERY:  Patent with antegrade flow.  LEFT CAROTID ARTERY: Mild intimal thickening, no hemodynamically significant stenosis.  LEFT VERTEBRAL ARTERY:  Patent with antegrade flow.  This was a limited exam due to lack of patient cooperation and patient movement.  IMPRESSION: Limited exam due to poor patient cooperation and patient movement. No hemodynamically significant carotid atherosclerotic vascular disease. Vertebrals are patent with antegrade flow.   Electronically Signed   By: Marcello Moores  Register   On: 05/26/2013 17:33   Dg Chest Port 1 View  05/25/2013   CLINICAL DATA:  Ventilator dependent respiratory failure.  EXAM: PORTABLE CHEST - 1 VIEW  COMPARISON:  05/24/2013  FINDINGS: Thoracic posterior spinal fixation rods are again seen, and position of the endotracheal tube is not well visualized on this study. Right subclavian central venous catheter and nasogastric tube remain in appropriate position.  Cardiomegaly stable. Mild worsening of infiltrate or atelectasis seen in the  right perihilar region right lung base. Left lower lobe opacity shows no significant change.  IMPRESSION: Mild worsening of infiltrate or atelectasis in right perihilar region and right lung base. Stable left lower lobe opacity. Position of endotracheal tube not well visualized on this study.   Electronically Signed   By: Earle Gell M.D.   On: 05/25/2013 08:45   Dg Chest Port 1 View  05/24/2013   CLINICAL DATA:  Followup evaluation.  Respiratory failure.  EXAM: PORTABLE CHEST - 1 VIEW  COMPARISON:  Chest x-ray 05/23/2013.  FINDINGS: Patient is intubated, but the tip of the endotracheal tube is obscured by overlying spinal hardware. The tip of the tube is estimated to be at the level of the thoracic inlet. A nasogastric tube is seen extending into the stomach, however, the tip of the nasogastric tube extends below the lower margin of the image. There is a right-sided subclavian central venous catheter with tip terminating in the mid superior vena cava. Patchy opacities throughout the mid to lower lungs bilaterally (left greater than right), which may reflect areas of atelectasis and/or consolidation. Blunting of the left costophrenic sulcus, indicative of a small to moderate left pleural effusion. Cephalization of the pulmonary vasculature. Heart size is borderline enlarged. Mediastinal contours are distorted by patient positioning. 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 Orthopedic fixation hardware throughout the thoracic spine.  IMPRESSION: 1. Support apparatus and postoperative changes, as above. 2. Patchy interstitial and airspace opacities throughout the mid to lower lungs bilaterally, concerning for areas of atelectasis and/or consolidation, most severe in the left lower lobe. 3. Small to moderate left pleural effusion. 4. Pulmonary venous congestion.   Electronically Signed   By: Vinnie Langton M.D.   On: 05/24/2013 07:54   Dg Chest Port 1 View  05/23/2013   CLINICAL  DATA:  Respiratory failure, paraplegia  EXAM: PORTABLE CHEST - 1 VIEW  COMPARISON:  Portable exam 1025 hr compared to 05/22/2013  FINDINGS: Nasogastric tube extends into stomach.  Tip of endotracheal tube is obscured by spinal hardware but is above the carina.  Bilateral spinal fixation rods and laminar hooks are present.  Enlargement of cardiac silhouette.  Subclavian central venous catheter tip projects over SVC.  Decreased right basilar atelectasis.  Increased atelectasis versus consolidation in left lower lobe.  No pneumothorax.  IMPRESSION: Improved right basilar atelectasis with increased atelectasis versus consolidation in left lower lobe.   Electronically Signed   By: Lavonia Dana M.D.   On: 05/23/2013 10:51   Dg Chest Port 1 View  05/22/2013   CLINICAL DATA:  Hypoxia  EXAM: PORTABLE CHEST - 1 VIEW  COMPARISON:  May 21, 2013  FINDINGS: Endotracheal tube tip is 4.6 cm above the carina. Nasogastric tube tip and side port are in the stomach. Central catheter tip is in the superior vena cava. There is no demonstrable pneumothorax.  There is focal consolidation in the left mid lung. There is atelectasis in the right base, however. Heart is mildly prominent with normal pulmonary vascularity. No adenopathy. Postoperative change is noted in the thoracic spine.  IMPRESSION: The tube and catheter positions as described. No pneumothorax. Focal infiltrate left mid lung. Patchy atelectasis right base.   Electronically Signed   By: Lowella Grip M.D.   On: 05/22/2013 08:33   Dg Chest Port 1 View  05/21/2013   CLINICAL DATA:  New right central venous catheter placement  EXAM: PORTABLE CHEST - 1 VIEW  COMPARISON:  05/21/2013  FINDINGS: Thoracic fusion hardware noted. Nasogastric tube tip terminates below the level of the hemidiaphragms but is not included in the field of view. Tip of the endotracheal tube is obscured by hardware but appears to terminate above the level of the carina. Right subclavian  approach central line tip is obscured by hardware but terminates at least at the level of the proximal SVC. No pneumothorax. Left-sided retrocardiac airspace opacity persists. Chronic right subclavian deformity reidentified.  IMPRESSION: Right subclavian approach central line tip is at least in the level of the proximal SVC but is obscured by overlying thoracic spinal hardware. Consider oblique view for possible improved visualization.   Electronically Signed   By: Conchita Paris M.D.   On: 05/21/2013 19:10   Dg Chest Portable 1 View  05/21/2013   CLINICAL DATA:  Post intubation  EXAM: PORTABLE CHEST - 1 VIEW  COMPARISON:  Portable exam 1201 hr compared to 06/25/2012  FINDINGS: Tip of endotracheal tube at or very near the carina and right mainstem bronchus origin, carina poorly localized.  Nasogastric tube extends into abdomen.  Orthopedic hardware in the thoracic spine limits assessment of the endotracheal tube and carina.  Numerous EKG leads project over chest.  Enlargement of cardiac silhouette with pulmonary vascular congestion.  Question mild basilar atelectasis versus infiltrate.  Upper lungs clear.  No gross pneumothorax.  Old mid right clavicular fracture.  IMPRESSION: Chronic is poorly localized but the tip of the endotracheal tube is likely at or very near the carina and origin of the right mainstem bronchus; consider withdrawal 1-2 cm.  Enlargement of cardiac silhouette with pulmonary vascular congestion.  Probable mild bibasilar atelectasis versus infiltrate.  Findings called to Dr. Betsey Holiday on 05/21/2013 at 1218 hr.   Electronically Signed   By: Lavonia Dana M.D.   On: 05/21/2013 12:18    Lab Results: Basic Metabolic Panel:  Recent Labs  06/03/13 0522 06/04/13 0450  NA 144 144  K 2.8* 3.7  CL 107 110  CO2 23 21  GLUCOSE 92 92  BUN 12 10  CREATININE 1.33* 1.16*  CALCIUM 8.0* 8.2*   Liver Function Tests: No results found for this basename: AST, ALT, ALKPHOS, BILITOT, PROT, ALBUMIN,   in the last 72 hours   CBC:  Recent Labs  06/02/13 1006 06/03/13 0522  WBC 8.4 7.1  HGB 8.6* 8.4*  HCT 26.2* 25.7*  MCV 91.0 90.8  PLT 382 343    Recent Results (from the past 240 hour(s))  URINE CULTURE     Status: None   Collection Time    05/29/13 10:42 AM  Result Value Range Status   Specimen Description URINE, SUPRAPUBIC   Final   Special Requests rocephin   Final   Culture  Setup Time     Final   Value: 05/29/2013 21:00     Performed at [facility] Count     Final   Value: NO GROWTH     Performed at [facility]   Culture     Final   Value: NO GROWTH     Performed at [facility]   Report Status 05/30/2013 FINAL   Final  CLOSTRIDIUM DIFFICILE BY PCR     Status: None   Collection Time    05/29/13 10:49 AM      Result Value Range Status   C difficile by pcr NEGATIVE  NEGATIVE Final  CULTURE, BLOOD (ROUTINE X 2)     Status: None   Collection Time    06/02/13 10:06 AM      Result Value Range Status   Specimen Description BLOOD LEFT HAND   Final   Special Requests BOTTLES DRAWN AEROBIC AND ANAEROBIC 6CC   Final   Culture NO GROWTH 2 DAYS   Final   Report Status PENDING   Incomplete  CULTURE, BLOOD (ROUTINE X 2)     Status: None   Collection Time    06/02/13 10:06 AM      Result Value Range Status   Specimen Description BLOOD LEFT ANTECUBITAL   Final   Special Requests     Final   Value: BOTTLES DRAWN AEROBIC AND ANAEROBIC AEB=8CC ANA=6CC   Culture NO GROWTH 2 DAYS   Final   Report Status PENDING   Incomplete     Hospital Course:  This is a 48 years old female with history of multiple medical illnesses including paraplegia was admitted due to respiratory failure. She was found unresponsive at her apartment unresponsive and she was intubated and was admitted to ICU. She was found to have sepsis probably from UTI, She was treated with IV antibiotics and was extubated. Her sepsis resolved. Her acute renal failure also  improved, but patient remained confused and disoriented. She was evaluated by neurology and diagnosed as metabolic encephalopathy. The family was advised for placement in assisted living but according the case worker the family declined to place the patient. They want to take her home in her current condition and they claimed the will provide her all the necessary care.  Discharge Exam: Blood pressure 176/91, pulse 112, temperature 98.4 F (36.9 C), temperature source Oral, resp. rate 20, height 5' 1.81" (1.57 m), weight 77.3 kg (170 lb 6.7 oz), SpO2 95.00%.   Disposition:  Home        Follow-up Information   Follow up with [facility].   Contact information:   [place] [id number] [phone]      Signed: [name]   06/04/2013, 8:24 AM

## 2013-06-04 NOTE — Progress Notes (Signed)
Subjective: Interval History: patient remained confused. Presently patient is restless  Objective: Vital signs in last 24 hours: Temp:  [98.1 F (36.7 C)-98.4 F (36.9 C)] 98.4 F (36.9 C) (01/09 0456) Pulse Rate:  [89-112] 112 (01/09 0456) Resp:  [20] 20 (01/09 0456) BP: (134-176)/(84-91) 176/91 mmHg (01/09 0456) SpO2:  [95 %-98 %] 95 % (01/09 0456) Weight:  [77.3 kg (170 lb 6.7 oz)] 77.3 kg (170 lb 6.7 oz) (01/09 0300) Weight change: -1.8 kg (-3 lb 15.5 oz)  Intake/Output from previous day: 01/08 0701 - 01/09 0700 In: 120 [P.O.:120] Out: 1351 [Urine:1350; Stool:1] Intake/Output this shift:    General appearance: alert, cooperative and no distress Resp: clear to auscultation bilaterally Cardio: regular rate and rhythm, S1, S2 normal, no murmur, click, rub or gallop GI: soft, non-tender; bowel sounds normal; no masses,  no organomegaly Extremities: extremities normal, atraumatic, no cyanosis or edema  Lab Results:  Recent Labs  06/02/13 1006 06/03/13 0522  WBC 8.4 7.1  HGB 8.6* 8.4*  HCT 26.2* 25.7*  PLT 382 343   BMET:   Recent Labs  06/03/13 0522 06/04/13 0450  NA 144 144  K 2.8* 3.7  CL 107 110  CO2 23 21  GLUCOSE 92 92  BUN 12 10  CREATININE 1.33* 1.16*  CALCIUM 8.0* 8.2*   No results found for this basename: PTH,  in the last 72 hours Iron Studies: No results found for this basename: IRON, TIBC, TRANSFERRIN, FERRITIN,  in the last 72 hours  Studies/Results: No results found.  I have reviewed the patient's current medications.  Assessment/Plan: Problem #1 acute kidney injury:. Her creatinine is 1.16  renal function is continue to improve. Presently returned to her baseline. Problem #2 respiratory failure: Has resolved. Problem #3 hypokalemia: Potassium has corrected. Problem #4 septic shock: Her blood pressure is better Problem #5 anemia: Her hemoglobin and hematocrit is stable. Problem #6 encephalopathy: Patient seems more confused and no  significant change Problem #7 paraplegia Problem #8 metabolic bone disease calcium and phosphorus is with in range. Plan:continue with present manegement We'll DC her IV fluid. Because of recovery of her renal failure her Sign -Off. Thank you for letting me participate in her management   LOS: 14 days   Meryl Hubers S 06/04/2013,7:53 AM

## 2013-06-04 NOTE — Discharge Planning (Signed)
Family notified of DC and has gone home to prepare pt's home.  Family will return to hosp when hosp bed has been delivered.

## 2013-06-04 NOTE — Discharge Planning (Addendum)
Pt and family stated that she was ready to go home and had no pain.  Pt central line was DCd per orders but Foley was left in since family said pt came in with foley.  Home health also comes out to home and takes care of foley.  Pt was given DC papers and son signed for her.  Pt was lifted into WC by son and placed in car by son.  [facility] will be coming out to home with RN and aide.  Pt was taken out to car by RN and family.

## 2013-06-07 LAB — CULTURE, BLOOD (ROUTINE X 2)
Culture: NO GROWTH
Culture: NO GROWTH

## 2013-06-08 ENCOUNTER — Encounter (HOSPITAL_COMMUNITY): Payer: Self-pay | Admitting: Emergency Medicine

## 2013-06-08 ENCOUNTER — Emergency Department (HOSPITAL_COMMUNITY): Payer: Medicaid Other

## 2013-06-08 ENCOUNTER — Inpatient Hospital Stay (HOSPITAL_COMMUNITY)
Admission: EM | Admit: 2013-06-08 | Discharge: 2013-06-11 | DRG: 100 | Disposition: A | Discharge status: Home / Self Care | Payer: Medicaid Other | Attending: Internal Medicine | Admitting: Internal Medicine

## 2013-06-08 ENCOUNTER — Other Ambulatory Visit: Payer: Self-pay

## 2013-06-08 DIAGNOSIS — R4182 Altered mental status, unspecified: Secondary | ICD-10-CM | POA: Diagnosis present

## 2013-06-08 DIAGNOSIS — G9340 Encephalopathy, unspecified: Secondary | ICD-10-CM | POA: Diagnosis present

## 2013-06-08 DIAGNOSIS — G40909 Epilepsy, unspecified, not intractable, without status epilepticus: Principal | ICD-10-CM | POA: Diagnosis present

## 2013-06-08 DIAGNOSIS — IMO0002 Reserved for concepts with insufficient information to code with codable children: Secondary | ICD-10-CM

## 2013-06-08 DIAGNOSIS — G8220 Paraplegia, unspecified: Secondary | ICD-10-CM | POA: Diagnosis present

## 2013-06-08 DIAGNOSIS — N319 Neuromuscular dysfunction of bladder, unspecified: Secondary | ICD-10-CM | POA: Diagnosis present

## 2013-06-08 LAB — AMMONIA: AMMONIA: 45 umol/L (ref 11–60)

## 2013-06-08 LAB — BASIC METABOLIC PANEL
BUN: 7 mg/dL (ref 6–23)
CO2: 27 mEq/L (ref 19–32)
Calcium: 8.4 mg/dL (ref 8.4–10.5)
Chloride: 106 mEq/L (ref 96–112)
Creatinine, Ser: 0.99 mg/dL (ref 0.50–1.10)
GFR, EST AFRICAN AMERICAN: 79 mL/min — AB (ref >90)
GFR, EST NON AFRICAN AMERICAN: 68 mL/min — AB (ref >90)
Glucose, Bld: 103 mg/dL — ABNORMAL HIGH (ref 70–99)
POTASSIUM: 3.3 meq/L — AB (ref 3.7–5.3)
SODIUM: 143 meq/L (ref 137–147)

## 2013-06-08 LAB — CBC
HCT: 29.4 % — ABNORMAL LOW (ref 36.0–46.0)
Hemoglobin: 9.6 g/dL — ABNORMAL LOW (ref 12.0–15.0)
MCH: 30.6 pg (ref 26.0–34.0)
MCHC: 32.7 g/dL (ref 30.0–36.0)
MCV: 93.6 fL (ref 78.0–100.0)
Platelets: 359 10*3/uL (ref 150–400)
RBC: 3.14 MIL/uL — ABNORMAL LOW (ref 3.87–5.11)
RDW: 15.7 % — AB (ref 11.5–15.5)
WBC: 5.8 10*3/uL (ref 4.0–10.5)

## 2013-06-08 LAB — HEPATIC FUNCTION PANEL
ALBUMIN: 2.4 g/dL — AB (ref 3.5–5.2)
ALK PHOS: 90 U/L (ref 39–117)
ALT: 16 U/L (ref 0–35)
AST: 19 U/L (ref 0–37)
Bilirubin, Direct: <0.2 mg/dL (ref 0.0–0.3)
TOTAL PROTEIN: 6.8 g/dL (ref 6.0–8.3)
Total Bilirubin: 0.2 mg/dL — ABNORMAL LOW (ref 0.3–1.2)

## 2013-06-08 LAB — LIPASE, BLOOD: Lipase: 89 U/L — ABNORMAL HIGH (ref 11–59)

## 2013-06-08 MED ORDER — BACLOFEN 10 MG PO TABS
40.0000 mg | ORAL_TABLET | Freq: Four times a day (QID) | ORAL | Status: DC
  Administered 2013-06-08 – 2013-06-09 (×5): 40 mg via ORAL
  Filled 2013-06-08 (×14): qty 4

## 2013-06-08 MED ORDER — NAPROXEN 250 MG PO TABS
500.0000 mg | ORAL_TABLET | Freq: Two times a day (BID) | ORAL | Status: DC
  Administered 2013-06-09 – 2013-06-11 (×4): 500 mg via ORAL
  Filled 2013-06-08 (×6): qty 2

## 2013-06-08 MED ORDER — LIDOCAINE 5 % EX PTCH
1.0000 | MEDICATED_PATCH | CUTANEOUS | Status: DC
  Administered 2013-06-08 – 2013-06-10 (×4): 1 via TRANSDERMAL
  Filled 2013-06-08 (×6): qty 1

## 2013-06-08 MED ORDER — ONDANSETRON HCL 4 MG/2ML IJ SOLN: 4.0000 mg | Freq: Four times a day (QID) | INTRAMUSCULAR | Status: DC | PRN

## 2013-06-08 MED ORDER — ONDANSETRON HCL 4 MG PO TABS: 4.0000 mg | ORAL_TABLET | Freq: Four times a day (QID) | ORAL | Status: DC | PRN

## 2013-06-08 MED ORDER — LIDOCAINE 5 % EX PTCH
MEDICATED_PATCH | CUTANEOUS | Status: AC
  Filled 2013-06-08: qty 1

## 2013-06-08 MED ORDER — HYDROCODONE-ACETAMINOPHEN 5-325 MG PO TABS
2.0000 | ORAL_TABLET | Freq: Two times a day (BID) | ORAL | Status: DC | PRN
  Filled 2013-06-08: qty 2

## 2013-06-08 MED ORDER — FESOTERODINE FUMARATE ER 4 MG PO TB24
4.0000 mg | ORAL_TABLET | Freq: Every day | ORAL | Status: DC
  Administered 2013-06-08 – 2013-06-10 (×3): 4 mg via ORAL
  Filled 2013-06-08 (×7): qty 1

## 2013-06-08 MED ORDER — HEPARIN SODIUM (PORCINE) 5000 UNIT/ML IJ SOLN
5000.0000 [IU] | Freq: Three times a day (TID) | INTRAMUSCULAR | Status: DC
  Administered 2013-06-08 – 2013-06-11 (×7): 5000 [IU] via SUBCUTANEOUS
  Filled 2013-06-08 (×8): qty 1

## 2013-06-08 MED ORDER — DOCUSATE SODIUM 100 MG PO CAPS
100.0000 mg | ORAL_CAPSULE | Freq: Every day | ORAL | Status: DC
  Administered 2013-06-08 – 2013-06-10 (×3): 100 mg via ORAL
  Filled 2013-06-08 (×5): qty 1

## 2013-06-08 MED ORDER — DANTROLENE SODIUM 25 MG PO CAPS
25.0000 mg | ORAL_CAPSULE | Freq: Every day | ORAL | Status: DC
  Filled 2013-06-08 (×3): qty 1

## 2013-06-08 MED ORDER — DIAZEPAM 5 MG PO TABS
5.0000 mg | ORAL_TABLET | Freq: Two times a day (BID) | ORAL | Status: DC | PRN
  Administered 2013-06-10: 5 mg via ORAL
  Filled 2013-06-08: qty 1

## 2013-06-08 MED ORDER — SODIUM CHLORIDE 0.9 % IV SOLN
INTRAVENOUS | Status: DC
  Administered 2013-06-08 – 2013-06-11 (×5): via INTRAVENOUS

## 2013-06-08 MED ORDER — GABAPENTIN 300 MG PO CAPS
300.0000 mg | ORAL_CAPSULE | Freq: Three times a day (TID) | ORAL | Status: DC
  Administered 2013-06-08 – 2013-06-11 (×7): 300 mg via ORAL
  Filled 2013-06-08 (×12): qty 1

## 2013-06-08 NOTE — ED Notes (Signed)
Pt brought in by EMS, co AMS since last night, pt has indwelling foley. FS 78, pt co N/V last night.

## 2013-06-08 NOTE — ED Notes (Signed)
Flushed pt's suprapubic catheter with 60 ml sterile water, no return, diaper noted to be wet upon inspection. Pt's family said the urine has been leaking around the site they believe.

## 2013-06-08 NOTE — ED Notes (Signed)
Brother of patient and patient yelling at each other.  She states he wants to put her in a nursing home and she yells back "I'll be damned if you are going to do that"  Sister of patient also in room.  Mediator between the two. Advised we will be transferring her to her room shortly.  Patient and family aware that suprapubic catheter is not functioning correctly and MD told them this needs to be evaluated.   Sister also offers that they were instructed to give her Benadryl for itching and thinks that has contributed to her being lethargic earlier.  Patient very alert and verbal at this time.

## 2013-06-08 NOTE — ED Notes (Signed)
Report received from Peyton Najjar, RN

## 2013-06-08 NOTE — H&P (Signed)
[name] MRN: [id number] DOB/AGE: 46-Feb-1967 46 y.o. Primary Care Physician:FANTA,TESFAYE, MD Admit date: 06/08/2013 Chief Complaint: Altered mental status. HPI: This 48 year old lady, who was recently hospitalized for approximately 2 weeks, and he was discharged just a couple of days ago from the hospital, presents once again with altered mental status. When she presented to the emergency room, she was unresponsive but when I arrived in the room, she is more responsive now. The etiology of her unresponsive on her previous hospitalization was likely a combination of urosepsis and medications. The son, who is at the bedside tonight, tells me that it seems that when she takes Ambien or baclofen, she becomes less responsive. She was able to give me some basic answers to questions but still appeared to be slightly altered in her mentation. She is now being admitted for further investigation. 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 This was several years ago.  Past Medical History  Diagnosis Date  . Paraplegia (lower)    Past Surgical History  Procedure Laterality Date  . Back surgery          History reviewed. No pertinent family history.  Social History:  reports that she has never smoked. She does not have any smokeless tobacco history on file. She reports that she does not drink alcohol or use illicit drugs.   Allergies:  Allergies  Allergen Reactions  . Quinine Derivatives Other (See Comments)    Alters mental status     (Not in a hospital admission)     [id number] from the symptoms mentioned above,there are no other symptoms referable to all systems reviewed.  Physical Exam: Blood pressure 141/83, pulse 70, resp. rate 16, height 5\' 2"  (1.575 m), weight 77.111 kg (170 lb), SpO2 99.00%. She does not look septic clinically. Her peripheries are warm and well perfused. She is able to open her eyes when I ask her to. She gives yes and no answers. She does not  know the day but does know  that she is in the hospital. Heart sounds are present without murmurs or added sounds. Lung fields are clear. Abdomen soft and nontender. She has a Foley catheter in situ which is not draining any urine whatsoever. There is no rash. There are no obvious focal neurological signs.    Recent Labs  06/08/13 1400  WBC 5.8  HGB 9.6*  HCT 29.4*  MCV 93.6  PLT 359    Recent Labs  06/08/13 1400  NA 143  K 3.3*  CL 106  CO2 27  GLUCOSE 103*  BUN 7  CREATININE 0.99  CALCIUM 8.4  lablast2(ast:2,ALT:2,alkphos:2,bilitot:2,prot:2,albumin:2)@    Recent Results (from the past 240 hour(s))  CULTURE, BLOOD (ROUTINE X 2)     Status: None   Collection Time    06/02/13 10:06 AM      Result Value Range Status   Specimen Description BLOOD LEFT HAND   Final   Special Requests BOTTLES DRAWN AEROBIC AND ANAEROBIC 6CC   Final   Culture NO GROWTH 5 DAYS   Final   Report Status 06/07/2013 FINAL   Final  CULTURE, BLOOD (ROUTINE X 2)     Status: None   Collection Time    06/02/13 10:06 AM      Result Value Range Status   Specimen Description BLOOD LEFT ANTECUBITAL   Final   Special Requests     Final   Value: BOTTLES DRAWN AEROBIC AND ANAEROBIC AEB=8CC ANA=6CC   Culture NO GROWTH 5 DAYS  Final   Report Status 06/07/2013 FINAL   Final     Dg Abd 1 View  05/21/2013   CLINICAL DATA:  Abdominal distention.  EXAM: ABDOMEN - 1 VIEW  COMPARISON:  May 21, 2013 CT scan of the abdomen and pelvis  FINDINGS: The study is quite limited due to scatter effects secondary to the patient's body habitus. The bowel gas pattern rib visualized does not appear abnormal. There may be distention of the urinary bladder but this is not a definite finding. There is a phlebolith in the right aspect of the pelvis. There is a nasogastric tube present whose tip likely lies below the level of the GE junction but the proximal port may lie above it. A chest x-ray would be useful to help determine  this further.  IMPRESSION: The study is quite limited due to the patient's body habitus. I cannot exclude distention of the urinary bladder but the findings are not definite. Please see the note above regarding positioning of the nasogastric tube.   Electronically Signed   By: David  Martinique   On: 05/21/2013 19:11   Ct Head Wo Contrast  06/08/2013   CLINICAL DATA:  Altered mental status.  EXAM: CT HEAD WITHOUT CONTRAST  TECHNIQUE: Contiguous axial images were obtained from the base of the skull through the vertex without intravenous contrast.  COMPARISON:  CT scan of May 21, 2013 ; MRI scan of May 31, 2013.  FINDINGS: Bony calvarium appears intact. Mild diffuse cortical atrophy is noted. Left frontal encephalomalacia is unchanged consistent with old infarction. No mass effect or midline shift is noted. Ventricular size is within normal limits. There is no evidence of mass lesion, hemorrhage or acute infarction.  IMPRESSION: Mild diffuse cortical atrophy. 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 No acute intracranial abnormality seen.   Electronically Signed   By: Sabino Dick M.D.   On: 06/08/2013 15:11   Ct Head Wo Contrast  05/21/2013   CLINICAL DATA:  Unresponsive.  EXAM: CT HEAD WITHOUT CONTRAST  TECHNIQUE: Contiguous axial images were obtained from the base of the skull through the vertex without intravenous contrast.  COMPARISON:  None.  FINDINGS: No mass. No hydrocephalus. No hemorrhage. Lucency noted in the left frontal white matter and cortex most likely from prior stroke. To exclude a significant underlying lesion such as a tumor MRI the brain is suggested. No acute bony abnormality identified. Air-fluid level in the left maxillary and sphenoid sinus. Sinusitis could present in this fashion. NG tube is noted. Soft tissue swelling in the nasopharynx.  IMPRESSION: 1. Left frontal white matter and cortical lucency most likely from prior stroke, to exclude underlying lesion including tumor MRI the brain can  be obtained. 2. Air-fluid level left maxillary and sphenoid sinus. These findings would be consistent with sinusitis. NG tube is noted. Poor aeration of the nasopharynx with soft tissue swelling is noted.   Electronically Signed   By: Marcello Moores  Register   On: 05/21/2013 13:19   Mr Brain Wo Contrast  05/31/2013   CLINICAL DATA:  Altered mental status.  Confusion.  EXAM: MRI HEAD WITHOUT CONTRAST  TECHNIQUE: Multiplanar, multiecho pulse sequences of the brain and surrounding structures were obtained without intravenous contrast.  COMPARISON:  CT head without contrast 05/21/2013  FINDINGS: The study is moderately degraded by patient motion on all sequences.  The diffusion-weighted images demonstrate no evidence for acute or subacute infarction. 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 There is thinning of the posterior corpus callosum likely representing  wallerian degeneration associated with this infarct.  No hemorrhage or mass lesion is evident. Mild generalized atrophy is present. The ventricles are proportionate to the degree of atrophy. No significant extra-axial fluid collection is present.  Flow is present in the major intracranial arteries. The paranasal sinuses are clear. There may be some fluid in the ethmoid air cells.  IMPRESSION: 1. Remote posterior left frontal lobe infarct. 2. No acute intracranial abnormality. 3. Mild generalized atrophy. 4. The study is moderately degraded by motion, diminishing sensitivity for small lesions.   Electronically Signed   By: Lawrence Santiago M.D.   On: 05/31/2013 11:35   US Renal  05/22/2013   CLINICAL DATA:  Rule out stenosis  EXAM: RENAL/URINARY TRACT ULTRASOUND COMPLETE  COMPARISON:  CT 06/21/2012  FINDINGS: Right Kidney:  Length: 10.8 cm in length. The right kidney is markedly irregular. The entire upper pole is heterogeneous. There are hyperechoic and hypoechoic areas. Central mass cannot be excluded. No hydronephrosis.  Left Kidney:  Length: 13.6  cm in length. No hydronephrosis or mass. Irregular upper pole contour.  Bladder:  Foley catheter decompresses the bladder.  IMPRESSION: No hydronephrosis.  The upper pole of the right kidney is markedly irregular underlying central mass cannot be excluded. CT or MR can be performed to further characterize.   Electronically Signed   By: Maryclare Bean M.D.   On: 05/22/2013 14:13   US Carotid Duplex Bilateral  05/26/2013   CLINICAL DATA:  Frontal infarct.  EXAM: BILATERAL CAROTID DUPLEX ULTRASOUND  TECHNIQUE: Pearline Cables scale imaging, color Doppler and duplex ultrasound were performed of bilateral carotid and vertebral arteries in the neck.  COMPARISON:  CT 05/21/2013.  FINDINGS: Criteria: Quantification of carotid stenosis is based on velocity parameters that correlate the residual internal carotid diameter with NASCET-based stenosis levels, using the diameter of the distal internal carotid lumen as the denominator for stenosis measurement.  The following velocity measurements were obtained:  RIGHT  ICA:  129 cm/sec  CCA:  81 cm/sec  SYSTOLIC ICA/CCA RATIO:  1.6.  DIASTOLIC ICA/CCA RATIO:  1.6.  ECA:  90 cm/sec  LEFT  ICA:  131 cm/sec  CCA:  76 cm/sec  SYSTOLIC ICA/CCA RATIO:  1.7.  DIASTOLIC ICA/CCA RATIO:  1.7.  ECA:  74 cm/sec  RIGHT CAROTID ARTERY: Mild intimal thickening, no hemodynamically significant stenosis .  RIGHT VERTEBRAL ARTERY:  Patent with antegrade flow.  LEFT CAROTID ARTERY: Mild intimal thickening, no hemodynamically significant stenosis.  LEFT VERTEBRAL ARTERY:  Patent with antegrade flow.  This was a limited exam due to lack of patient cooperation and patient movement.  IMPRESSION: Limited exam due to poor patient cooperation and patient movement. No hemodynamically significant carotid atherosclerotic vascular disease. Vertebrals are patent with antegrade flow.   Electronically Signed   By: Marcello Moores  Register   On: 05/26/2013 17:33   Dg Chest Port 1 View  05/25/2013   CLINICAL DATA:  Ventilator  dependent respiratory failure.  EXAM: PORTABLE CHEST - 1 VIEW  COMPARISON:  05/24/2013  FINDINGS: Thoracic posterior spinal fixation rods are again seen, and position of the endotracheal tube is not well visualized on this study. Right subclavian central venous catheter and nasogastric tube remain in appropriate position.  Cardiomegaly stable. Mild worsening of infiltrate or atelectasis seen in the right perihilar region right lung base. Left lower lobe opacity shows no significant change.  IMPRESSION: Mild worsening of infiltrate or atelectasis in right perihilar region and right lung base. Stable left lower lobe opacity. Position of endotracheal tube  not well visualized on this study.   Electronically Signed   By: Earle Gell M.D.   On: 05/25/2013 08:45   Dg Chest Port 1 View  05/24/2013   CLINICAL DATA:  Followup evaluation.  Respiratory failure.  EXAM: PORTABLE CHEST - 1 VIEW  COMPARISON:  Chest x-ray 05/23/2013.  FINDINGS: Patient is intubated, but the tip of the endotracheal tube is obscured by overlying spinal hardware. The tip of the tube is estimated to be at the level of the thoracic inlet. A nasogastric tube is seen extending into the stomach, however, the tip of the nasogastric tube extends below the lower margin of the image. There is a right-sided subclavian central venous catheter with tip terminating in the mid superior vena cava. Patchy opacities throughout the mid to lower lungs bilaterally (left greater than right), which may reflect areas of atelectasis and/or consolidation. Blunting of the left costophrenic sulcus, indicative of a small to moderate left pleural effusion. Cephalization of the pulmonary vasculature. Heart size is borderline enlarged. Mediastinal contours are distorted by patient positioning. 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 Orthopedic fixation hardware throughout the thoracic spine.  IMPRESSION: 1. Support apparatus and postoperative  changes, as above. 2. Patchy interstitial and airspace opacities throughout the mid to lower lungs bilaterally, concerning for areas of atelectasis and/or consolidation, most severe in the left lower lobe. 3. Small to moderate left pleural effusion. 4. Pulmonary venous congestion.   Electronically Signed   By: Vinnie Langton M.D.   On: 05/24/2013 07:54   Dg Chest Port 1 View  05/23/2013   CLINICAL DATA:  Respiratory failure, paraplegia  EXAM: PORTABLE CHEST - 1 VIEW  COMPARISON:  Portable exam 1025 hr compared to 05/22/2013  FINDINGS: Nasogastric tube extends into stomach.  Tip of endotracheal tube is obscured by spinal hardware but is above the carina.  Bilateral spinal fixation rods and laminar hooks are present.  Enlargement of cardiac silhouette.  Subclavian central venous catheter tip projects over SVC.  Decreased right basilar atelectasis.  Increased atelectasis versus consolidation in left lower lobe.  No pneumothorax.  IMPRESSION: Improved right basilar atelectasis with increased atelectasis versus consolidation in left lower lobe.   Electronically Signed   By: Lavonia Dana M.D.   On: 05/23/2013 10:51   Dg Chest Port 1 View  05/22/2013   CLINICAL DATA:  Hypoxia  EXAM: PORTABLE CHEST - 1 VIEW  COMPARISON:  May 21, 2013  FINDINGS: Endotracheal tube tip is 4.6 cm above the carina. Nasogastric tube tip and side port are in the stomach. Central catheter tip is in the superior vena cava. There is no demonstrable pneumothorax.  There is focal consolidation in the left mid lung. There is atelectasis in the right base, however. Heart is mildly prominent with normal pulmonary vascularity. No adenopathy. Postoperative change is noted in the thoracic spine.  IMPRESSION: The tube and catheter positions as described. No pneumothorax. Focal infiltrate left mid lung. Patchy atelectasis right base.   Electronically Signed   By: Lowella Grip M.D.   On: 05/22/2013 08:33   Dg Chest Port 1 View  05/21/2013    CLINICAL DATA:  New right central venous catheter placement  EXAM: PORTABLE CHEST - 1 VIEW  COMPARISON:  05/21/2013  FINDINGS: Thoracic fusion hardware noted. Nasogastric tube tip terminates below the level of the hemidiaphragms but is not included in the field of view. Tip of the endotracheal tube is obscured by hardware but appears to terminate above the level of  the carina. Right subclavian approach central line tip is obscured by hardware but terminates at least at the level of the proximal SVC. No pneumothorax. Left-sided retrocardiac airspace opacity persists. Chronic right subclavian deformity reidentified.  IMPRESSION: Right subclavian approach central line tip is at least in the level of the proximal SVC but is obscured by overlying thoracic spinal hardware. Consider oblique view for possible improved visualization.   Electronically Signed   By: Conchita Paris M.D.   On: 05/21/2013 19:10   Dg Chest Portable 1 View  05/21/2013   CLINICAL DATA:  Post intubation  EXAM: PORTABLE CHEST - 1 VIEW  COMPARISON:  Portable exam 1201 hr compared to 06/25/2012  FINDINGS: Tip of endotracheal tube at or very near the carina and right mainstem bronchus origin, carina poorly localized.  Nasogastric tube extends into abdomen.  Orthopedic hardware in the thoracic spine limits assessment of the endotracheal tube and carina.  Numerous EKG leads project over chest.  Enlargement of cardiac silhouette with pulmonary vascular congestion.  Question mild basilar atelectasis versus infiltrate.  Upper lungs clear.  No gross pneumothorax.  Old mid right clavicular fracture.  IMPRESSION: Chronic is poorly localized but the tip of the endotracheal tube is likely at or very near the carina and origin of the right mainstem bronchus; consider withdrawal 1-2 cm.  Enlargement of cardiac silhouette with pulmonary vascular congestion.  Probable mild bibasilar atelectasis versus infiltrate.  Findings called to Dr. Betsey Holiday on 05/21/2013 at  1218 hr.   Electronically Signed   By: Lavonia Dana M.D.   On: 05/21/2013 12:18   Impression: 1. Altered mental status, unclear etiology. She does not clinically look septic. She does not appear to have a pneumonia clinically. I wonder whether this alteration in her mentation is medication effects. 2. Poor urine output. Her creatinine is normal but I'm concerned about this as the family tell me that she has not passed any urine for over 12 hours. 3. Paraplegia.     Plan: 1. Admit to medical floor. 2. Intravenous fluids. 3. Urine drug screen. 4. Follow neurologically for improvement.  Further recommendations will depend on patient's hospital progress.      [place] C   06/08/2013, 7:10 PM

## 2013-06-08 NOTE — ED Notes (Signed)
Pt is a quadriplegic, bed confined.

## 2013-06-08 NOTE — ED Notes (Signed)
Hospitalist at bedside to assess pt.
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
## 2013-06-09 ENCOUNTER — Inpatient Hospital Stay (HOSPITAL_COMMUNITY): Payer: Medicaid Other

## 2013-06-09 LAB — CBC
HCT: 27.1 % — ABNORMAL LOW (ref 36.0–46.0)
HEMOGLOBIN: 8.9 g/dL — AB (ref 12.0–15.0)
MCH: 30.7 pg (ref 26.0–34.0)
MCHC: 32.8 g/dL (ref 30.0–36.0)
MCV: 93.4 fL (ref 78.0–100.0)
PLATELETS: 308 10*3/uL (ref 150–400)
RBC: 2.9 MIL/uL — ABNORMAL LOW (ref 3.87–5.11)
RDW: 15.9 % — AB (ref 11.5–15.5)
WBC: 6.2 10*3/uL (ref 4.0–10.5)

## 2013-06-09 LAB — COMPREHENSIVE METABOLIC PANEL
ALK PHOS: 85 U/L (ref 39–117)
ALT: 15 U/L (ref 0–35)
AST: 19 U/L (ref 0–37)
Albumin: 2.2 g/dL — ABNORMAL LOW (ref 3.5–5.2)
BUN: 7 mg/dL (ref 6–23)
CALCIUM: 7.9 mg/dL — AB (ref 8.4–10.5)
CO2: 27 meq/L (ref 19–32)
Chloride: 107 mEq/L (ref 96–112)
Creatinine, Ser: 0.9 mg/dL (ref 0.50–1.10)
GFR, EST AFRICAN AMERICAN: 88 mL/min — AB (ref >90)
GFR, EST NON AFRICAN AMERICAN: 76 mL/min — AB (ref >90)
GLUCOSE: 90 mg/dL (ref 70–99)
Potassium: 3.3 mEq/L — ABNORMAL LOW (ref 3.7–5.3)
SODIUM: 144 meq/L (ref 137–147)
Total Bilirubin: <0.2 mg/dL — ABNORMAL LOW (ref 0.3–1.2)
Total Protein: 6.2 g/dL (ref 6.0–8.3)

## 2013-06-09 LAB — HEPATITIS PANEL, ACUTE
HCV AB: NEGATIVE
HEP A IGM: NONREACTIVE
Hep B C IgM: NONREACTIVE
Hepatitis B Surface Ag: NEGATIVE

## 2013-06-09 MED ORDER — LACOSAMIDE 50 MG PO TABS
50.0000 mg | ORAL_TABLET | Freq: Two times a day (BID) | ORAL | Status: DC
  Administered 2013-06-09 – 2013-06-11 (×4): 50 mg via ORAL
  Filled 2013-06-09 (×4): qty 1

## 2013-06-09 MED ORDER — JUVEN PO PACK
1.0000 | PACK | Freq: Two times a day (BID) | ORAL | Status: DC
  Administered 2013-06-10: 1 via ORAL
  Filled 2013-06-09 (×13): qty 1

## 2013-06-09 MED ORDER — LIDOCAINE 5 % EX PTCH
MEDICATED_PATCH | CUTANEOUS | Status: AC
  Filled 2013-06-09: qty 1

## 2013-06-09 MED ORDER — LACOSAMIDE 50 MG PO TABS: 50.0000 mg | ORAL_TABLET | Freq: Two times a day (BID) | ORAL | Status: DC

## 2013-06-09 MED ORDER — LACOSAMIDE 50 MG PO TABS: 100.0000 mg | ORAL_TABLET | Freq: Two times a day (BID) | ORAL | Status: DC

## 2013-06-09 NOTE — Progress Notes (Signed)
Upon assessment and cleaning patient, the tip of the suprapubic catheter appeared to be protruding from her vaginal area.  Patient's dressing around the suprapubic catheter was saturated and changed.  Catheter bag still remained empty.  Will continue to monitor patient.  [name] R

## 2013-06-09 NOTE — Progress Notes (Signed)
Subjective: Patient was admitted last night due to change in mental status. Patient feels better today and she more alert and awake.  Objective: Vital signs in last 24 hours: Temp:  [97.6 F (36.4 C)-97.7 F (36.5 C)] 97.6 F (36.4 C) (01/14 0600) Pulse Rate:  [66-76] 74 (01/14 0600) Resp:  [14-20] 20 (01/14 0600) BP: (141-160)/(76-93) 150/81 mmHg (01/14 0600) SpO2:  [99 %-100 %] 100 % (01/14 0600) Weight:  [77.111 kg (170 lb)-79.9 kg (176 lb 2.4 oz)] 79.9 kg (176 lb 2.4 oz) (01/13 2030) Weight change:  Last BM Date: 06/08/13  Intake/Output from previous day:    PHYSICAL EXAM General appearance: no distress and slowed mentation Resp: clear to auscultation bilaterally Cardio: S1, S2 normal GI: soft, non-tender; bowel sounds normal; no masses,  no organomegaly Extremities: paraplegic  Lab Results:    @labtest @ [facility] No results found for this basename: PHART, PCO2, PO2ART, TCO2, HCO3,  in the last 72 hours CULTURES Recent Results (from the past 240 hour(s))  CULTURE, BLOOD (ROUTINE X 2)     Status: None   Collection Time    06/02/13 10:06 AM      Result Value Range Status   Specimen Description BLOOD LEFT HAND   Final   Special Requests BOTTLES DRAWN AEROBIC AND ANAEROBIC 6CC   Final   Culture NO GROWTH 5 DAYS   Final   Report Status 06/07/2013 FINAL   Final  CULTURE, BLOOD (ROUTINE X 2)     Status: None   Collection Time    06/02/13 10:06 AM      Result Value Range Status   Specimen Description BLOOD LEFT ANTECUBITAL   Final   Special Requests     Final   Value: BOTTLES DRAWN AEROBIC AND ANAEROBIC AEB=8CC ANA=6CC   Culture NO GROWTH 5 DAYS   Final   Report Status 06/07/2013 FINAL   Final   Studies/Results: Ct Head Wo Contrast  06/08/2013   CLINICAL DATA:  Altered mental status.  EXAM: CT HEAD WITHOUT CONTRAST  TECHNIQUE: Contiguous axial images were obtained from the base of the skull through the vertex without intravenous contrast.  COMPARISON:  CT scan of  May 21, 2013 ; MRI scan of May 31, 2013.  FINDINGS: Bony calvarium appears intact. Mild diffuse cortical atrophy is noted. Left frontal encephalomalacia is unchanged consistent with old infarction. No mass effect or midline shift is noted. Ventricular size is within normal limits. There is no evidence of mass lesion, hemorrhage or acute infarction.  IMPRESSION: Mild diffuse cortical atrophy. 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 No acute intracranial abnormality seen.   Electronically Signed   By: Sabino Dick M.D.   On: 06/08/2013 15:11    Medications: I have reviewed the patient's current medications.  Assesment: Active Problems:   Paraplegia   Altered mental status   Plan: Medications reviewed Continue Iv hydration Neurology consult Continue supportive care.    LOS: 1 day   Dazha Kempa 06/09/2013, 8:01 AM
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
## 2013-06-09 NOTE — Care Management Note (Addendum)
    Page 1 of 2   06/11/2013     9:40:06 AM   CARE MANAGEMENT NOTE 06/11/2013  Patient:  ZELAYA, KULAS   Account Number:  [id number]  Date Initiated:  06/09/2013  Documentation initiated by:  Theophilus Kinds  Subjective/Objective Assessment:   Pt admitted from home with AMS and dehydration. Pt lives at home with her children who provide 24 hour care for the pt. Pt has a CAP aide daily and is active with [facility] RN and PT. Pt has a hospital bed, lift, shower chair and w/c for home use.     Action/Plan:   CM will arrange resumption of [facility] at discharge. No other CM needs noted.   Anticipated DC Date:  06/11/2013   Anticipated DC Plan:  [place]  CM consult      [facility] Choice  Resumption Of Svcs/PTA Provider   Choice offered to / List presented to:  C-4 Adult Children        HH arranged  HH-1 RN  [place].   Status of service:  Completed, signed off Medicare Important Message given?   (If response is "NO", the following Medicare IM given date fields will be blank) Date Medicare IM given:   Date Additional Medicare IM given:    Discharge Disposition:  [place]  Per UR Regulation:    If discussed at Long Length of Stay Meetings, dates discussed:    Comments:  06/11/13 0940 [name], RN BSN CM Pt discharged home today with resumption of [facility] RN and PT. Romualdo Bolk of [facility] is aware and will collect the pts information from the chart. [place] services to start within 48 hours of discharge. No new DME needs noted. Pt, pts son, and pts nurse aware of discharge arrangements.  06/09/13 [place], RN BSN CM

## 2013-06-09 NOTE — Consult Note (Signed)
Has neurogenic bladder has permanent s/p catheter it is coming out tthru vagin i will look inside and adjust the catheter. Consult note to be dictated will get rena ultrasound.

## 2013-06-09 NOTE — Progress Notes (Signed)
INITIAL NUTRITION ASSESSMENT  DOCUMENTATION CODES Per approved criteria  -Obesity Unspecified   INTERVENTION: Juven, 1 packet, po BID to support wound healing Provided diet education to address adequate hydration and protein-energy intake  NUTRITION DIAGNOSIS: Inadequate oral intake related to altered mental status as evidenced by .   Goal: Pt to meet >/= 90% of their estimated nutrition needs   Monitor:  Po intake, labs and wt trends   Reason for Assessment: Malnutrition Screen Score = 2  48 y.o. female   ASSESSMENT: pt is 48 yo FM with altered mental status. 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 Assessed by RD 05/31/13 during admission related to sepsis, UTI, respiratory failure, acute renal failure and aspiration pneumonia. Family (caregivers) are present during visit. Pt reports good appetite today. Provided diet education with handout "Healthy lifestyle Grocery List" and addressed fluid intake and daily protein goals. She has unstageable wound  to left ankle  She declines oral supplements and complains they give her diarrhea. No physical signs of malnutrition observed at this time.   Height: Ht Readings from Last 1 Encounters:  06/08/13 5\' 2"  (1.575 m)    Weight: Wt Readings from Last 1 Encounters:  06/08/13 176 lb 2.4 oz (79.9 kg)    Adjusted Ideal Body Weight: 102# (46.3 kg)  Adjusted % Ideal Body Weight: 173%  Wt Readings from Last 10 Encounters:  06/08/13 176 lb 2.4 oz (79.9 kg)  06/04/13 170 lb 6.7 oz (77.3 kg)  06/22/12 175 lb 7.8 oz (79.6 kg)  09/29/11 130 lb (58.968 kg)  08/14/11 120 lb (54.432 kg)  08/04/11 145 lb (65.772 kg)  06/10/11 155 lb (70.308 kg)    Usual Body Weight: 175# (past year)  % Usual Body Weight: 100%  BMI:  Body mass index is 32.21 kg/(m^2).obesity class I  Estimated Nutritional Needs: Kcal: 1400-1600 Protein: 75-85 gr Fluid: >2000 ml daily  Skin: unstageable to left ankle  Diet Order: General 60-75%  EDUCATION  NEEDS: -Education needs addressed   Intake/Output Summary (Last 24 hours) at 06/09/13 1313 Last data filed at 06/09/13 0924  Gross per 24 hour  Intake    240 ml  Output      0 ml  Net    240 ml    Last BM: 06/08/13  Labs:   Recent Labs Lab 06/04/13 0450 06/08/13 1400 06/09/13 0528  NA 144 143 144  K 3.7 3.3* 3.3*  CL 110 106 107  CO2 21 27 27   BUN 10 7 7   CREATININE 1.16* 0.99 0.90  CALCIUM 8.2* 8.4 7.9*  GLUCOSE 92 103* 90    CBG (last 3)  No results found for this basename: GLUCAP,  in the last 72 hours  Scheduled Meds: . baclofen  40 mg Oral QID  . docusate sodium  100 mg Oral QHS  . fesoterodine  4 mg Oral Daily  . gabapentin  300 mg Oral TID  . heparin  5,000 Units Subcutaneous Q8H  . lacosamide  50 mg Oral BID   Followed by  . [START ON 06/16/2013] lacosamide  100 mg Oral BID  . lidocaine  1 patch Transdermal Q24H  . naproxen  500 mg Oral BID WC    Continuous Infusions: . sodium chloride 75 mL/hr at 06/09/13 0800    Past Medical History  Diagnosis Date  . Paraplegia (lower)     Past Surgical History  Procedure Laterality Date  . Back surgery      Colman Cater MS,RD,CSG,LDN Office: [phone] Pager: #  [phone] 

## 2013-06-09 NOTE — Progress Notes (Signed)
UR chart review completed.  

## 2013-06-10 ENCOUNTER — Inpatient Hospital Stay (HOSPITAL_COMMUNITY): Payer: Medicaid Other

## 2013-06-10 ENCOUNTER — Inpatient Hospital Stay (HOSPITAL_COMMUNITY)
Admit: 2013-06-10 | Discharge: 2013-06-10 | Disposition: A | Discharge status: Home / Self Care | Payer: Medicaid Other | Attending: Neurology | Admitting: Neurology

## 2013-06-10 LAB — COMPREHENSIVE METABOLIC PANEL
ALK PHOS: 86 U/L (ref 39–117)
ALT: 14 U/L (ref 0–35)
AST: 19 U/L (ref 0–37)
Albumin: 2.3 g/dL — ABNORMAL LOW (ref 3.5–5.2)
BUN: 5 mg/dL — ABNORMAL LOW (ref 6–23)
CO2: 27 mEq/L (ref 19–32)
CREATININE: 0.91 mg/dL (ref 0.50–1.10)
Calcium: 8.1 mg/dL — ABNORMAL LOW (ref 8.4–10.5)
Chloride: 110 mEq/L (ref 96–112)
GFR calc non Af Amer: 75 mL/min — ABNORMAL LOW (ref >90)
GFR, EST AFRICAN AMERICAN: 87 mL/min — AB (ref >90)
GLUCOSE: 97 mg/dL (ref 70–99)
POTASSIUM: 3.2 meq/L — AB (ref 3.7–5.3)
Sodium: 146 mEq/L (ref 137–147)
Total Protein: 6.3 g/dL (ref 6.0–8.3)

## 2013-06-10 LAB — GLUCOSE, CAPILLARY: GLUCOSE-CAPILLARY: 80 mg/dL (ref 70–99)

## 2013-06-10 MED ORDER — POTASSIUM CHLORIDE CRYS ER 20 MEQ PO TBCR
40.0000 meq | EXTENDED_RELEASE_TABLET | Freq: Once | ORAL | Status: AC
  Administered 2013-06-10: 40 meq via ORAL
  Filled 2013-06-10 (×2): qty 2

## 2013-06-10 MED ORDER — BACLOFEN 10 MG PO TABS
20.0000 mg | ORAL_TABLET | Freq: Four times a day (QID) | ORAL | Status: DC
  Administered 2013-06-10 – 2013-06-11 (×3): 20 mg via ORAL
  Filled 2013-06-10 (×3): qty 2

## 2013-06-10 MED ORDER — LIDOCAINE 5 % EX PTCH
MEDICATED_PATCH | CUTANEOUS | Status: AC
  Filled 2013-06-10: qty 1

## 2013-06-10 NOTE — Clinical Social Work Note (Signed)
CSW received consult for advance directives. Met with son at bedside. Pt sleeping soundly. Per notes, pt is oriented to self only now. CSW discussed living will and HCPOA. Son reports pt was requesting to change her HCPOA from her sister to children. Provided packet for family to review. Family aware to contact staff if requesting to complete during hospitalization, but aware pt must be more oriented and able to understand information.  Benay Pike, [place]

## 2013-06-10 NOTE — Progress Notes (Signed)
EEG Completed; Results Pending  

## 2013-06-10 NOTE — Procedures (Signed)
Pt had siezure so cysto was cancelled i am still waiting for her records from [facility]  i requesred cystogram to see the suprapubic catheter location.  The catheter was coming out from urethra th baloon deflated and plled the cathter back into the bladder now it is drainng i did this under floro with the radiologist  The catheter was secure and i left radiology department.

## 2013-06-10 NOTE — Progress Notes (Signed)
Patient ID: [name], female   DOB: [birth year], 48 y.o.   MRN: [id number]  [place] A. Merlene Laughter, MD     [email or web address]          [name] is an 48 y.o. female.   Assessment/Plan: Recurrent encephalopathy. Etiology includes unwitnessed seizures and medication effect. Examination does not indicate focal neurological deficit. The patient's baclofen at a quite high dose will be reduced. Will continue with the current dose of Vimpat. An EEG will be obtained. We will repeat the patient examination. Patient was to have a GU procedure today but we did discuss holding this procedure given the unresponsiveness at this time.  The patient has been unresponsive today per the nursing staff. Indeed, on examination today she is sleeping and arousable with sternal rub. She does know that she is in the hospital and follow commands briskly but she is quite drowsy of unclear reasons. Pupils are large 6 mm but reactive coronary reflexes are intact she does follow commands well and has full extraocular movements. Facial muscle strength again shows mild weakness on the left side both upper and lower. She has antigravity strength at least 3 bilaterally.      Objective: Vital signs in last 24 hours: Temp:  [98.1 F (36.7 C)-98.6 F (37 C)] 98.1 F (36.7 C) (01/15 0647) Pulse Rate:  [66-90] 66 (01/15 0647) Resp:  [20] 20 (01/14 2133) BP: (145-153)/(81-90) 153/81 mmHg (01/15 0647) SpO2:  [96 %-99 %] 96 % (01/15 0647)  Intake/Output from previous day: 01/14 0701 - 01/15 0700 In: 2765.8 [P.O.:720; I.V.:2045.8] Out: -  Intake/Output this shift:   Nutritional status: General   Lab Results: Results for orders placed during the hospital encounter of 06/08/13 (from the past 48 hour(s))  CBC     Status: Abnormal   Collection Time    06/08/13  2:00 PM      Result Value Range   WBC 5.8  4.0 - 10.5 K/uL   RBC 3.14 (*) 3.87 - 5.11 MIL/uL   Hemoglobin 9.6 (*) 12.0 - 15.0  g/dL   HCT 29.4 (*) 36.0 - 46.0 %   MCV 93.6  78.0 - 100.0 fL   MCH 30.6  26.0 - 34.0 pg   MCHC 32.7  30.0 - 36.0 g/dL   RDW 15.7 (*) 11.5 - 15.5 %   Platelets 359  150 - 400 K/uL  BASIC METABOLIC PANEL     Status: Abnormal   Collection Time    06/08/13  2:00 PM      Result Value Range   Sodium 143  137 - 147 mEq/L   Potassium 3.3 (*) 3.7 - 5.3 mEq/L   Chloride 106  96 - 112 mEq/L   CO2 27  19 - 32 mEq/L   Glucose, Bld 103 (*) 70 - 99 mg/dL   BUN 7  6 - 23 mg/dL   Creatinine, Ser 0.99  0.50 - 1.10 mg/dL   Calcium 8.4  8.4 - 10.5 mg/dL   GFR calc non Af Amer 68 (*) >90 mL/min   GFR calc Af Amer 79 (*) >90 mL/min   Comment: (NOTE)     The eGFR has been calculated using the CKD EPI equation.     This calculation has not been validated in all clinical situations.     eGFR's persistently <90 mL/min signify possible Chronic Kidney     Disease.  HEPATITIS PANEL, ACUTE     Status: None   Collection Time    06/08/13  2:00 PM      Result Value Range   Hepatitis B Surface Ag NEGATIVE  NEGATIVE   HCV Ab NEGATIVE  NEGATIVE   Hep A IgM NON REACTIVE  NON REACTIVE   Hep B C IgM NON REACTIVE  NON REACTIVE   Comment: (NOTE)     High levels of Hepatitis B Core IgM antibody are detectable     during the acute stage of Hepatitis B. This antibody is used     to differentiate current from past HBV infection.     Performed at [facility], BLOOD     Status: Abnormal   Collection Time    06/08/13  2:00 PM      Result Value Range   Lipase 89 (*) 11 - 59 U/L  HEPATIC FUNCTION PANEL     Status: Abnormal   Collection Time    06/08/13  2:00 PM      Result Value Range   Total Protein 6.8  6.0 - 8.3 g/dL   Albumin 2.4 (*) 3.5 - 5.2 g/dL   AST 19  0 - 37 U/L   ALT 16  0 - 35 U/L   Alkaline Phosphatase 90  39 - 117 U/L   Total Bilirubin 0.2 (*) 0.3 - 1.2 mg/dL   Bilirubin, Direct <0.2  0.0 - 0.3 mg/dL   Indirect Bilirubin NOT CALCULATED  0.3 - 0.9 mg/dL  AMMONIA     Status: None    Collection Time    06/08/13  3:28 PM      Result Value Range   Ammonia 45  11 - 60 umol/L  COMPREHENSIVE METABOLIC PANEL     Status: Abnormal   Collection Time    06/09/13  5:28 AM      Result Value Range   Sodium 144  137 - 147 mEq/L   Potassium 3.3 (*) 3.7 - 5.3 mEq/L   Chloride 107  96 - 112 mEq/L   CO2 27  19 - 32 mEq/L   Glucose, Bld 90  70 - 99 mg/dL   BUN 7  6 - 23 mg/dL   Creatinine, Ser 0.90  0.50 - 1.10 mg/dL   Calcium 7.9 (*) 8.4 - 10.5 mg/dL   Total Protein 6.2  6.0 - 8.3 g/dL   Albumin 2.2 (*) 3.5 - 5.2 g/dL   AST 19  0 - 37 U/L   ALT 15  0 - 35 U/L   Alkaline Phosphatase 85  39 - 117 U/L   Total Bilirubin <0.2 (*) 0.3 - 1.2 mg/dL   GFR calc non Af Amer 76 (*) >90 mL/min   GFR calc Af Amer 88 (*) >90 mL/min   Comment: (NOTE)     The eGFR has been calculated using the CKD EPI equation.     This calculation has not been validated in all clinical situations.     eGFR's persistently <90 mL/min signify possible Chronic Kidney     Disease.  CBC     Status: Abnormal   Collection Time    06/09/13  5:28 AM      Result Value Range   WBC 6.2  4.0 - 10.5 K/uL   RBC 2.90 (*) 3.87 - 5.11 MIL/uL   Hemoglobin 8.9 (*) 12.0 - 15.0 g/dL   HCT 27.1 (*) 36.0 - 46.0 %   MCV 93.4  78.0 - 100.0 fL   MCH 30.7  26.0 - 34.0 pg   MCHC 32.8  30.0 - 36.0  g/dL   RDW 15.9 (*) 11.5 - 15.5 %   Platelets 308  150 - 400 K/uL  GLUCOSE, CAPILLARY     Status: None   Collection Time    06/10/13  6:43 AM      Result Value Range   Glucose-Capillary 80  70 - 99 mg/dL  COMPREHENSIVE METABOLIC PANEL     Status: Abnormal   Collection Time    06/10/13  7:24 AM      Result Value Range   Sodium 146  137 - 147 mEq/L   Potassium 3.2 (*) 3.7 - 5.3 mEq/L   Chloride 110  96 - 112 mEq/L   CO2 27  19 - 32 mEq/L   Glucose, Bld 97  70 - 99 mg/dL   BUN 5 (*) 6 - 23 mg/dL   Creatinine, Ser 0.91  0.50 - 1.10 mg/dL   Calcium 8.1 (*) 8.4 - 10.5 mg/dL   Total Protein 6.3  6.0 - 8.3 g/dL   Albumin 2.3 (*)  3.5 - 5.2 g/dL   AST 19  0 - 37 U/L   ALT 14  0 - 35 U/L   Alkaline Phosphatase 86  39 - 117 U/L   Total Bilirubin <0.2 (*) 0.3 - 1.2 mg/dL   GFR calc non Af Amer 75 (*) >90 mL/min   GFR calc Af Amer 87 (*) >90 mL/min   Comment: (NOTE)     The eGFR has been calculated using the CKD EPI equation.     This calculation has not been validated in all clinical situations.     eGFR's persistently <90 mL/min signify possible Chronic Kidney     Disease.    Lipid Panel No results found for this basename: CHOL, TRIG, HDL, CHOLHDL, VLDL, LDLCALC,  in the last 72 hours  Studies/Results: Ct Head Wo Contrast  06/08/2013   CLINICAL DATA:  Altered mental status.  EXAM: CT HEAD WITHOUT CONTRAST  TECHNIQUE: Contiguous axial images were obtained from the base of the skull through the vertex without intravenous contrast.  COMPARISON:  CT scan of May 21, 2013 ; MRI scan of May 31, 2013.  FINDINGS: Bony calvarium appears intact. Mild diffuse cortical atrophy is noted. Left frontal encephalomalacia is unchanged consistent with old infarction. No mass effect or midline shift is noted. Ventricular size is within normal limits. There is no evidence of mass lesion, hemorrhage or acute infarction.  IMPRESSION: Mild diffuse cortical atrophy. 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 No acute intracranial abnormality seen.   Electronically Signed   By: Sabino Dick M.D.   On: 06/08/2013 15:11   US Renal  06/09/2013   CLINICAL DATA:  Paraplegia, neurogenic bladder, question right hydronephrosis  EXAM: RENAL/URINARY TRACT ULTRASOUND COMPLETE  COMPARISON:  05/22/2013  FINDINGS: Right Kidney:  Length: 9.3 cm. Marked cortical thinning. Normal cortical echogenicity. Focus of abnormal echogenicity at inferior pole question preserved cortex, mass, or bowel, poorly defined, area approximately approximately 2.6 cm in greatest size, uncertain etiology. No hydronephrosis or shadowing calcification.  Left Kidney:  Length: 12.0 cm. Normal  cortical thickness and echogenicity. No mass, hydronephrosis or shadowing calcification  Bladder:  Appears normal for degree of bladder distention.  IMPRESSION: Unremarkable left kidney.  Unable to assess urinary bladder, decompressed by Foley catheter.  Marked right renal cortical atrophy with questionable hypoechoic region at the inferior pole of the right kidney measuring 2.6 cm in greatest size.  This focus is poorly defined of uncertain etiology.  This could represent a mass, complex fluid  collection, less likely scarring or adjacent bowel loop.  Further evaluation by a CT with IV and oral contrast recommended to exclude renal mass.   Electronically Signed   By: Lavonia Dana M.D.   On: 06/09/2013 16:58   Scheduled Meds: . baclofen  20 mg Oral QID  . docusate sodium  100 mg Oral QHS  . fesoterodine  4 mg Oral Daily  . gabapentin  300 mg Oral TID  . heparin  5,000 Units Subcutaneous Q8H  . lacosamide  50 mg Oral BID   Followed by  . [START ON 06/16/2013] lacosamide  100 mg Oral BID  . lidocaine  1 patch Transdermal Q24H  . naproxen  500 mg Oral BID WC  . nutrition supplement (JUVEN)  1 packet Oral BID BM  . potassium chloride  40 mEq Oral Once   Continuous Infusions: . sodium chloride 75 mL/hr at 06/10/13 0918   PRN Meds:.diazepam, HYDROcodone-acetaminophen, ondansetron (ZOFRAN) IV, ondansetron    LOS: 2 days   Neida Ellegood A. Merlene Laughter, M.D.  Diplomate, [employer] of Psychiatry and Neurology ( Neurology).

## 2013-06-10 NOTE — Progress Notes (Signed)
Upon assessment this morning, patient was unable to open eyes.  Patient would respond with "yes" to her name.  Pt's vitals were stable and had a blood sugar of 80.  Pt. Responded to some painful stimuli with a moan.  MD was notified and family is present at bedside.  Family stated that this is exactly how patient was prior to being admitted and this is why they brought her in.  STAT CMET was ordered.  Will continue to monitor patient.    Audiel Scheiber R

## 2013-06-10 NOTE — Progress Notes (Signed)
Subjective: Patient is more lethargic. She had seizure and was seen by neurologist and started on treatment. He catheter is leaking and malpositioned. She was seen by urologist and planned for cystoscopy today.   Objective: Vital signs in last 24 hours: Temp:  [98.1 F (36.7 C)-98.6 F (37 C)] 98.1 F (36.7 C) (01/15 0647) Pulse Rate:  [66-90] 66 (01/15 0647) Resp:  [20] 20 (01/14 2133) BP: (145-153)/(81-90) 153/81 mmHg (01/15 0647) SpO2:  [96 %-99 %] 96 % (01/15 0647) Weight change:  Last BM Date: 06/09/13  Intake/Output from previous day: 01/14 0701 - 01/15 0700 In: 2765.8 [P.O.:720; I.V.:2045.8] Out: -   PHYSICAL EXAM General appearance: no distress and slowed mentation Resp: clear to auscultation bilaterally Cardio: S1, S2 normal GI: soft, non-tender; bowel sounds normal; no masses,  no organomegaly Extremities: paraplegic  Lab Results:    @labtest @ [facility] No results found for this basename: PHART, PCO2, PO2ART, TCO2, HCO3,  in the last 72 hours CULTURES Recent Results (from the past 240 hour(s))  CULTURE, BLOOD (ROUTINE X 2)     Status: None   Collection Time    06/02/13 10:06 AM      Result Value Range Status   Specimen Description BLOOD LEFT HAND   Final   Special Requests BOTTLES DRAWN AEROBIC AND ANAEROBIC 6CC   Final   Culture NO GROWTH 5 DAYS   Final   Report Status 06/07/2013 FINAL   Final  CULTURE, BLOOD (ROUTINE X 2)     Status: None   Collection Time    06/02/13 10:06 AM      Result Value Range Status   Specimen Description BLOOD LEFT ANTECUBITAL   Final   Special Requests     Final   Value: BOTTLES DRAWN AEROBIC AND ANAEROBIC AEB=8CC ANA=6CC   Culture NO GROWTH 5 DAYS   Final   Report Status 06/07/2013 FINAL   Final   Studies/Results: Ct Head Wo Contrast  06/08/2013   CLINICAL DATA:  Altered mental status.  EXAM: CT HEAD WITHOUT CONTRAST  TECHNIQUE: Contiguous axial images were obtained from the base of the skull through the vertex without  intravenous contrast.  COMPARISON:  CT scan of May 21, 2013 ; MRI scan of May 31, 2013.  FINDINGS: Bony calvarium appears intact. Mild diffuse cortical atrophy is noted. Left frontal encephalomalacia is unchanged consistent with old infarction. No mass effect or midline shift is noted. Ventricular size is within normal limits. There is no evidence of mass lesion, hemorrhage or acute infarction.  IMPRESSION: Mild diffuse cortical atrophy. 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 No acute intracranial abnormality seen.   Electronically Signed   By: Sabino Dick M.D.   On: 06/08/2013 15:11   US Renal  06/09/2013   CLINICAL DATA:  Paraplegia, neurogenic bladder, question right hydronephrosis  EXAM: RENAL/URINARY TRACT ULTRASOUND COMPLETE  COMPARISON:  05/22/2013  FINDINGS: Right Kidney:  Length: 9.3 cm. Marked cortical thinning. Normal cortical echogenicity. Focus of abnormal echogenicity at inferior pole question preserved cortex, mass, or bowel, poorly defined, area approximately approximately 2.6 cm in greatest size, uncertain etiology. No hydronephrosis or shadowing calcification.  Left Kidney:  Length: 12.0 cm. Normal cortical thickness and echogenicity. No mass, hydronephrosis or shadowing calcification  Bladder:  Appears normal for degree of bladder distention.  IMPRESSION: Unremarkable left kidney.  Unable to assess urinary bladder, decompressed by Foley catheter.  Marked right renal cortical atrophy with questionable hypoechoic region at the inferior pole of the right kidney measuring 2.6 cm in greatest size.  This focus is poorly defined of uncertain etiology.  This could represent a mass, complex fluid collection, less likely scarring or adjacent bowel loop.  Further evaluation by a CT with IV and oral contrast recommended to exclude renal mass.   Electronically Signed   By: Lavonia Dana M.D.   On: 06/09/2013 16:58    Medications: I have reviewed the patient's current medications.  Assesment: Active  Problems:   Paraplegia   Altered mental status seizure disorder  Plan: Medications reviewed Continue Iv hydration Neurology consult appreciated Urology consult appreciated  Continue supportive care.    LOS: 2 days   Breland Trouten 06/10/2013, 8:00 AM

## 2013-06-11 MED ORDER — LACOSAMIDE 100 MG PO TABS: 100.0000 mg | ORAL_TABLET | Freq: Two times a day (BID) | ORAL | Status: DC

## 2013-06-11 NOTE — Progress Notes (Signed)
06/11/13 1023 Reviewed discharge instructions with patient and son at bedside. Pt states son lives with her and is caregiver. Given copy of AVS, medication list, prescription, f/u information. Home health RN set up with [facility]. CAP aide to resume today per case management. Prophylactic sacral foam dressing in place per protocol. Romualdo Bolk with [facility] states home health can order for home use per patient request. New foam dressing applied to left outer ankle stage 3 pressure sore. Dry and intact. Pressure reducing boots in place for discharge. Instructed to notify MD of any further drowsiness, lethargy, or leaking to suprapubic catheter site. Son states will call for f/u appointment as well. Patient and son verbalized understanding of instructions. IV site d/c'd, within normal limits. No c/o pain at this time. Pt in stable condition awaiting discharge home. Donavan Foil, RN

## 2013-06-11 NOTE — Discharge Summary (Signed)
Physician Discharge Summary  Patient ID: [name] MRN: [id number] DOB/AGE: 1966/03/25 48 y.o. Primary Care Physician:Khole Arterburn, MD Admit date: 06/08/2013 Discharge date: 06/11/2013    Discharge Diagnoses:   Active Problems:   Paraplegia   Altered mental status seizure disorder S/P suprapubic catheter insertion   Medication List    STOP taking these medications       dantrolene 25 MG capsule  Commonly known as:  DANTRIUM      TAKE these medications       baclofen 20 MG tablet  Commonly known as:  LIORESAL  Take 40 mg by mouth 4 (four) times daily.     diazepam 5 MG tablet  Commonly known as:  VALIUM  Take 5 mg by mouth every 12 (twelve) hours as needed. For anxiety     diphenhydrAMINE 25 MG tablet  Commonly known as:  BENADRYL  Take 25 mg by mouth once as needed for itching or allergies.     docusate sodium 100 MG capsule  Commonly known as:  COLACE  Take 100 mg by mouth at bedtime.     fesoterodine 4 MG Tb24 tablet  Commonly known as:  TOVIAZ  Take 1 tablet (4 mg total) by mouth daily.     gabapentin 300 MG capsule  Commonly known as:  NEURONTIN  Take 300 mg by mouth 3 (three) times daily.     HYDROcodone-acetaminophen 5-325 MG per tablet  Commonly known as:  NORCO/VICODIN  Take 2 tablets by mouth 2 (two) times daily as needed for moderate pain or severe pain. Pain.     Lacosamide 100 MG Tabs  Take 1 tablet (100 mg total) by mouth 2 (two) times daily.  Start taking on:  06/16/2013     lidocaine 5 %  Commonly known as:  LIDODERM  Place 1 patch onto the skin daily. Remove & Discard patch within 12 hours or as directed by MD     multivitamin tablet  Take 1 tablet by mouth daily.     naproxen 500 MG tablet  Commonly known as:  NAPROSYN  Take 500 mg by mouth 2 (two) times daily with a meal.        Discharged Condition: improved    Consults: neurology and urology  Significant Diagnostic Studies: Dg Abd 1 View  05/21/2013   CLINICAL  DATA:  Abdominal distention.  EXAM: ABDOMEN - 1 VIEW  COMPARISON:  May 21, 2013 CT scan of the abdomen and pelvis  FINDINGS: The study is quite limited due to scatter effects secondary to the patient's body habitus. The bowel gas pattern rib visualized does not appear abnormal. There may be distention of the urinary bladder but this is not a definite finding. There is a phlebolith in the right aspect of the pelvis. There is a nasogastric tube present whose tip likely lies below the level of the GE junction but the proximal port may lie above it. A chest x-ray would be useful to help determine this further.  IMPRESSION: The study is quite limited due to the patient's body habitus. I cannot exclude distention of the urinary bladder but the findings are not definite. Please see the note above regarding positioning of the nasogastric tube.   Electronically Signed   By: David  Martinique   On: 05/21/2013 19:11   Ct Head Wo Contrast  06/08/2013   CLINICAL DATA:  Altered mental status.  EXAM: CT HEAD WITHOUT CONTRAST  TECHNIQUE: Contiguous axial images were obtained from the base of the skull  through the vertex without intravenous contrast.  COMPARISON:  CT scan of May 21, 2013 ; MRI scan of May 31, 2013.  FINDINGS: Bony calvarium appears intact. Mild diffuse cortical atrophy is noted. Left frontal encephalomalacia is unchanged consistent with old infarction. No mass effect or midline shift is noted. Ventricular size is within normal limits. There is no evidence of mass lesion, hemorrhage or acute infarction.  IMPRESSION: Mild diffuse cortical atrophy. 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 No acute intracranial abnormality seen.   Electronically Signed   By: Sabino Dick M.D.   On: 06/08/2013 15:11   Ct Head Wo Contrast  05/21/2013   CLINICAL DATA:  Unresponsive.  EXAM: CT HEAD WITHOUT CONTRAST  TECHNIQUE: Contiguous axial images were obtained from the base of the skull through the vertex without intravenous  contrast.  COMPARISON:  None.  FINDINGS: No mass. No hydrocephalus. No hemorrhage. Lucency noted in the left frontal white matter and cortex most likely from prior stroke. To exclude a significant underlying lesion such as a tumor MRI the brain is suggested. No acute bony abnormality identified. Air-fluid level in the left maxillary and sphenoid sinus. Sinusitis could present in this fashion. NG tube is noted. Soft tissue swelling in the nasopharynx.  IMPRESSION: 1. Left frontal white matter and cortical lucency most likely from prior stroke, to exclude underlying lesion including tumor MRI the brain can be obtained. 2. Air-fluid level left maxillary and sphenoid sinus. These findings would be consistent with sinusitis. NG tube is noted. Poor aeration of the nasopharynx with soft tissue swelling is noted.   Electronically Signed   By: Marcello Moores  Register   On: 05/21/2013 13:19   Mr Brain Wo Contrast  05/31/2013   CLINICAL DATA:  Altered mental status.  Confusion.  EXAM: MRI HEAD WITHOUT CONTRAST  TECHNIQUE: Multiplanar, multiecho pulse sequences of the brain and surrounding structures were obtained without intravenous contrast.  COMPARISON:  CT head without contrast 05/21/2013  FINDINGS: The study is moderately degraded by patient motion on all sequences.  The diffusion-weighted images demonstrate no evidence for acute or subacute infarction. 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 There is thinning of the posterior corpus callosum likely representing wallerian degeneration associated with this infarct.  No hemorrhage or mass lesion is evident. Mild generalized atrophy is present. The ventricles are proportionate to the degree of atrophy. No significant extra-axial fluid collection is present.  Flow is present in the major intracranial arteries. The paranasal sinuses are clear. There may be some fluid in the ethmoid air cells.  IMPRESSION: 1. Remote posterior left frontal lobe infarct. 2. No acute  intracranial abnormality. 3. Mild generalized atrophy. 4. The study is moderately degraded by motion, diminishing sensitivity for small lesions.   Electronically Signed   By: Lawrence Santiago M.D.   On: 05/31/2013 11:35   Dg Cystogram  06/10/2013   CLINICAL DATA:  Non functional suprapubic catheter  EXAM: CYSTOGRAM  TECHNIQUE: After catheterization of the urinary bladder following sterile technique the bladder was filled with approximately 20 mL of Cysto-Hypaque 30% by drip infusion. Serial spot images were obtained during bladder filling.  COMPARISON:  None  FLUOROSCOPY TIME:  0 min 48 seconds  FINDINGS: Tip of the suprapubic catheter extends low in the pelvis, suspect below bladder base.  With installation of contrast via gravity drip, contrast opacifies the perineum.  Visual inspection confirms at the catheter traverses the urinary bladder and exits the urethra into the vagina.  Findings were discussed with Dr. Michela Pitcher, who  came to the fluoroscopy suite and repositioned the catheter into the urinary bladder under fluoroscopic visualization.  Instillation of approximately 20 cc of contrast demonstrates the catheter now normally positioned within the urinary bladder without contrast extravasation.  IMPRESSION: Initially the suprapubic catheter extended across the urinary bladder and exited the urethra into the vagina.  The suprapubic catheter was repositioned into the urinary bladder by Dr. Michela Pitcher under fluoroscopy.   Electronically Signed   By: Lavonia Dana M.D.   On: 06/10/2013 17:17   US Renal  06/09/2013   CLINICAL DATA:  Paraplegia, neurogenic bladder, question right hydronephrosis  EXAM: RENAL/URINARY TRACT ULTRASOUND COMPLETE  COMPARISON:  05/22/2013  FINDINGS: Right Kidney:  Length: 9.3 cm. Marked cortical thinning. Normal cortical echogenicity. Focus of abnormal echogenicity at inferior pole question preserved cortex, mass, or bowel, poorly defined, area approximately approximately 2.6 cm in greatest  size, uncertain etiology. No hydronephrosis or shadowing calcification.  Left Kidney:  Length: 12.0 cm. Normal cortical thickness and echogenicity. No mass, hydronephrosis or shadowing calcification  Bladder:  Appears normal for degree of bladder distention.  IMPRESSION: Unremarkable left kidney.  Unable to assess urinary bladder, decompressed by Foley catheter.  Marked right renal cortical atrophy with questionable hypoechoic region at the inferior pole of the right kidney measuring 2.6 cm in greatest size.  This focus is poorly defined of uncertain etiology.  This could represent a mass, complex fluid collection, less likely scarring or adjacent bowel loop.  Further evaluation by a CT with IV and oral contrast recommended to exclude renal mass.   Electronically Signed   By: Lavonia Dana M.D.   On: 06/09/2013 16:58   US Renal  05/22/2013   CLINICAL DATA:  Rule out stenosis  EXAM: RENAL/URINARY TRACT ULTRASOUND COMPLETE  COMPARISON:  CT 06/21/2012  FINDINGS: Right Kidney:  Length: 10.8 cm in length. The right kidney is markedly irregular. The entire upper pole is heterogeneous. There are hyperechoic and hypoechoic areas. Central mass cannot be excluded. No hydronephrosis.  Left Kidney:  Length: 13.6 cm in length. No hydronephrosis or mass. Irregular upper pole contour.  Bladder:  Foley catheter decompresses the bladder.  IMPRESSION: No hydronephrosis.  The upper pole of the right kidney is markedly irregular underlying central mass cannot be excluded. CT or MR can be performed to further characterize.   Electronically Signed   By: Maryclare Bean M.D.   On: 05/22/2013 14:13   US Carotid Duplex Bilateral  05/26/2013   CLINICAL DATA:  Frontal infarct.  EXAM: BILATERAL CAROTID DUPLEX ULTRASOUND  TECHNIQUE: Pearline Cables scale imaging, color Doppler and duplex ultrasound were performed of bilateral carotid and vertebral arteries in the neck.  COMPARISON:  CT 05/21/2013.  FINDINGS: Criteria: Quantification of carotid stenosis is  based on velocity parameters that correlate the residual internal carotid diameter with NASCET-based stenosis levels, using the diameter of the distal internal carotid lumen as the denominator for stenosis measurement.  The following velocity measurements were obtained:  RIGHT  ICA:  129 cm/sec  CCA:  81 cm/sec  SYSTOLIC ICA/CCA RATIO:  1.6.  DIASTOLIC ICA/CCA RATIO:  1.6.  ECA:  90 cm/sec  LEFT  ICA:  131 cm/sec  CCA:  76 cm/sec  SYSTOLIC ICA/CCA RATIO:  1.7.  DIASTOLIC ICA/CCA RATIO:  1.7.  ECA:  74 cm/sec  RIGHT CAROTID ARTERY: Mild intimal thickening, no hemodynamically significant stenosis .  RIGHT VERTEBRAL ARTERY:  Patent with antegrade flow.  LEFT CAROTID ARTERY: Mild intimal thickening, no hemodynamically significant stenosis.  LEFT VERTEBRAL ARTERY:  Patent with antegrade flow.  This was a limited exam due to lack of patient cooperation and patient movement.  IMPRESSION: Limited exam due to poor patient cooperation and patient movement. No hemodynamically significant carotid atherosclerotic vascular disease. Vertebrals are patent with antegrade flow.   Electronically Signed   By: Marcello Moores  Register   On: 05/26/2013 17:33   Dg Chest Port 1 View  05/25/2013   CLINICAL DATA:  Ventilator dependent respiratory failure.  EXAM: PORTABLE CHEST - 1 VIEW  COMPARISON:  05/24/2013  FINDINGS: Thoracic posterior spinal fixation rods are again seen, and position of the endotracheal tube is not well visualized on this study. Right subclavian central venous catheter and nasogastric tube remain in appropriate position.  Cardiomegaly stable. Mild worsening of infiltrate or atelectasis seen in the right perihilar region right lung base. Left lower lobe opacity shows no significant change.  IMPRESSION: Mild worsening of infiltrate or atelectasis in right perihilar region and right lung base. Stable left lower lobe opacity. Position of endotracheal tube not well visualized on this study.   Electronically Signed   By: Earle Gell M.D.   On: 05/25/2013 08:45   Dg Chest Port 1 View  05/24/2013   CLINICAL DATA:  Followup evaluation.  Respiratory failure.  EXAM: PORTABLE CHEST - 1 VIEW  COMPARISON:  Chest x-ray 05/23/2013.  FINDINGS: Patient is intubated, but the tip of the endotracheal tube is obscured by overlying spinal hardware. The tip of the tube is estimated to be at the level of the thoracic inlet. A nasogastric tube is seen extending into the stomach, however, the tip of the nasogastric tube extends below the lower margin of the image. There is a right-sided subclavian central venous catheter with tip terminating in the mid superior vena cava. Patchy opacities throughout the mid to lower lungs bilaterally (left greater than right), which may reflect areas of atelectasis and/or consolidation. Blunting of the left costophrenic sulcus, indicative of a small to moderate left pleural effusion. Cephalization of the pulmonary vasculature. Heart size is borderline enlarged. Mediastinal contours are distorted by patient positioning. 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 Orthopedic fixation hardware throughout the thoracic spine.  IMPRESSION: 1. Support apparatus and postoperative changes, as above. 2. Patchy interstitial and airspace opacities throughout the mid to lower lungs bilaterally, concerning for areas of atelectasis and/or consolidation, most severe in the left lower lobe. 3. Small to moderate left pleural effusion. 4. Pulmonary venous congestion.   Electronically Signed   By: Vinnie Langton M.D.   On: 05/24/2013 07:54   Dg Chest Port 1 View  05/23/2013   CLINICAL DATA:  Respiratory failure, paraplegia  EXAM: PORTABLE CHEST - 1 VIEW  COMPARISON:  Portable exam 1025 hr compared to 05/22/2013  FINDINGS: Nasogastric tube extends into stomach.  Tip of endotracheal tube is obscured by spinal hardware but is above the carina.  Bilateral spinal fixation rods and laminar hooks are present.   Enlargement of cardiac silhouette.  Subclavian central venous catheter tip projects over SVC.  Decreased right basilar atelectasis.  Increased atelectasis versus consolidation in left lower lobe.  No pneumothorax.  IMPRESSION: Improved right basilar atelectasis with increased atelectasis versus consolidation in left lower lobe.   Electronically Signed   By: Lavonia Dana M.D.   On: 05/23/2013 10:51   Dg Chest Port 1 View  05/22/2013   CLINICAL DATA:  Hypoxia  EXAM: PORTABLE CHEST - 1 VIEW  COMPARISON:  May 21, 2013  FINDINGS: Endotracheal tube tip is  4.6 cm above the carina. Nasogastric tube tip and side port are in the stomach. Central catheter tip is in the superior vena cava. There is no demonstrable pneumothorax.  There is focal consolidation in the left mid lung. There is atelectasis in the right base, however. Heart is mildly prominent with normal pulmonary vascularity. No adenopathy. Postoperative change is noted in the thoracic spine.  IMPRESSION: The tube and catheter positions as described. No pneumothorax. Focal infiltrate left mid lung. Patchy atelectasis right base.   Electronically Signed   By: Lowella Grip M.D.   On: 05/22/2013 08:33   Dg Chest Port 1 View  05/21/2013   CLINICAL DATA:  New right central venous catheter placement  EXAM: PORTABLE CHEST - 1 VIEW  COMPARISON:  05/21/2013  FINDINGS: Thoracic fusion hardware noted. Nasogastric tube tip terminates below the level of the hemidiaphragms but is not included in the field of view. Tip of the endotracheal tube is obscured by hardware but appears to terminate above the level of the carina. Right subclavian approach central line tip is obscured by hardware but terminates at least at the level of the proximal SVC. No pneumothorax. Left-sided retrocardiac airspace opacity persists. Chronic right subclavian deformity reidentified.  IMPRESSION: Right subclavian approach central line tip is at least in the level of the proximal SVC but is  obscured by overlying thoracic spinal hardware. Consider oblique view for possible improved visualization.   Electronically Signed   By: Conchita Paris M.D.   On: 05/21/2013 19:10   Dg Chest Portable 1 View  05/21/2013   CLINICAL DATA:  Post intubation  EXAM: PORTABLE CHEST - 1 VIEW  COMPARISON:  Portable exam 1201 hr compared to 06/25/2012  FINDINGS: Tip of endotracheal tube at or very near the carina and right mainstem bronchus origin, carina poorly localized.  Nasogastric tube extends into abdomen.  Orthopedic hardware in the thoracic spine limits assessment of the endotracheal tube and carina.  Numerous EKG leads project over chest.  Enlargement of cardiac silhouette with pulmonary vascular congestion.  Question mild basilar atelectasis versus infiltrate.  Upper lungs clear.  No gross pneumothorax.  Old mid right clavicular fracture.  IMPRESSION: Chronic is poorly localized but the tip of the endotracheal tube is likely at or very near the carina and origin of the right mainstem bronchus; consider withdrawal 1-2 cm.  Enlargement of cardiac silhouette with pulmonary vascular congestion.  Probable mild bibasilar atelectasis versus infiltrate.  Findings called to Dr. Betsey Holiday on 05/21/2013 at 1218 hr.   Electronically Signed   By: Lavonia Dana M.D.   On: 05/21/2013 12:18    Lab Results: Basic Metabolic Panel:  Recent Labs  06/09/13 0528 06/10/13 0724  NA 144 146  K 3.3* 3.2*  CL 107 110  CO2 27 27  GLUCOSE 90 97  BUN 7 5*  CREATININE 0.90 0.91  CALCIUM 7.9* 8.1*   Liver Function Tests:  Recent Labs  06/09/13 0528 06/10/13 0724  AST 19 19  ALT 15 14  ALKPHOS 85 86  BILITOT <0.2* <0.2*  PROT 6.2 6.3  ALBUMIN 2.2* 2.3*     CBC:  Recent Labs  06/08/13 1400 06/09/13 0528  WBC 5.8 6.2  HGB 9.6* 8.9*  HCT 29.4* 27.1*  MCV 93.6 93.4  PLT 359 308    Recent Results (from the past 240 hour(s))  CULTURE, BLOOD (ROUTINE X 2)     Status: None   Collection Time    06/02/13  10:06 AM  Result Value Range Status   Specimen Description BLOOD LEFT HAND   Final   Special Requests BOTTLES DRAWN AEROBIC AND ANAEROBIC 6CC   Final   Culture NO GROWTH 5 DAYS   Final   Report Status 06/07/2013 FINAL   Final  CULTURE, BLOOD (ROUTINE X 2)     Status: None   Collection Time    06/02/13 10:06 AM      Result Value Range Status   Specimen Description BLOOD LEFT ANTECUBITAL   Final   Special Requests     Final   Value: BOTTLES DRAWN AEROBIC AND ANAEROBIC AEB=8CC ANA=6CC   Culture NO GROWTH 5 DAYS   Final   Report Status 06/07/2013 FINAL   Final     Hospital Course:  This is a 48 years old female patient with history of multipile medical illnesses was admitted due seizure and change in mental status. Patient was seen by neurology and started on Vimpat and her medications were reviewed and adjusted. Patient was had malpositioned suprapubic catheter. She was evaluated by urologist and the catheter was pulled repositioned in her bladder. Over patient improved and will be discharged in stable condition.   Discharge Exam: Blood pressure 151/95, pulse 80, temperature 97.5 F (36.4 C), temperature source Oral, resp. rate 20, height 5\' 2"  (1.575 m), weight 79.9 kg (176 lb 2.4 oz), SpO2 92.00%.   Disposition:  home      Signed: Aasim Restivo  06/11/2013, 8:13 AM

## 2013-06-11 NOTE — Progress Notes (Signed)
Shared Advance Directives information with patient and we discussed with the family.  Planned to complete the documents on January 16.

## 2013-06-11 NOTE — Procedures (Signed)
[place] A. Merlene Laughter, MD     [email or web address]          FACILITY: [facility]  PHYSICIAN: Yukari Flax A. Merlene Laughter, M.D.  DATE OF PROCEDURE: 06/10/2013 EEG INTERPRETATION  INDICATION: This is a 48 year old lady who presents with episode of unresponsiveness and confusion.  MEDICATIONS: Prior to Admission medications   Medication Sig Start Date End Date Taking? Authorizing Provider  baclofen (LIORESAL) 20 MG tablet Take 40 mg by mouth 4 (four) times daily.     Historical Provider, MD  diazepam (VALIUM) 5 MG tablet Take 5 mg by mouth every 12 (twelve) hours as needed. For anxiety    Historical Provider, MD  diphenhydrAMINE (BENADRYL) 25 MG tablet Take 25 mg by mouth once as needed for itching or allergies.    Historical Provider, MD  docusate sodium (COLACE) 100 MG capsule Take 100 mg by mouth at bedtime.    Historical Provider, MD  fesoterodine (TOVIAZ) 4 MG TB24 Take 1 tablet (4 mg total) by mouth daily. 06/30/12   [name], MD  gabapentin (NEURONTIN) 300 MG capsule Take 300 mg by mouth 3 (three) times daily.     Historical Provider, MD  HYDROcodone-acetaminophen (NORCO/VICODIN) 5-325 MG per tablet Take 2 tablets by mouth 2 (two) times daily as needed for moderate pain or severe pain. Pain. 06/05/12   Historical Provider, MD  lacosamide 100 MG TABS Take 1 tablet (100 mg total) by mouth 2 (two) times daily. 06/16/13   [name], MD  lidocaine (LIDODERM) 5 % Place 1 patch onto the skin daily. Remove & Discard patch within 12 hours or as directed by MD    Historical Provider, MD  Multiple Vitamin (MULTIVITAMIN) tablet Take 1 tablet by mouth daily.    Historical Provider, MD  naproxen (NAPROSYN) 500 MG tablet Take 500 mg by mouth 2 (two) times daily with a meal.      Historical Provider, MD     ANALYSIS: A 16-channel recording using standard 10/20 measurements is conducted for 20 minutes. The background activity gets as high as 6-1/2 Hz although there is occasional delta slowing  of 20 half to 4 Hz seen throughout the recording. The recording is somewhat degraded with the myogenic and movement artifact. Photic stimulation and hyperventilation were not carried out. The patient is noted to have some evidence of sleep architecture with vertex sharp wave observed. Sharp wave activity with maximum activity at [facility] and is associated with the field and sometimes involve the right side at C3. No clear electrographic seizures are observed.   IMPRESSION:  This recording is abnormal due to the following: 1. Moderate generalized slowing. 2. Left frontal epileptiform discharges although no electrographic seizures are observed.  Lennie Dunnigan A. Merlene Laughter, M.D. Diplomate, Tax adviser of Psychiatry and Neurology ( Neurology).

## 2013-06-11 NOTE — Plan of Care (Signed)
Problem: Phase II Progression Outcomes Goal: Obtain order to discontinue catheter if appropriate Outcome: Not Applicable Date Met:  29/56/21 06/11/13 1020 Patient has chronic suprapubic catheter in place, dressing dry and intact, draining clear, yellow urine output. Donavan Foil, RN

## 2013-06-11 NOTE — Progress Notes (Signed)
06/11/13 1054 Patient left floor in stable condition via w/c, accompanied by son and nurse techs. Discharged home. Donavan Foil, RN

## 2013-10-12 ENCOUNTER — Other Ambulatory Visit (HOSPITAL_COMMUNITY): Payer: Self-pay | Admitting: Internal Medicine

## 2013-12-27 ENCOUNTER — Encounter (HOSPITAL_COMMUNITY): Payer: Self-pay | Admitting: Emergency Medicine

## 2013-12-27 ENCOUNTER — Emergency Department (HOSPITAL_COMMUNITY): Payer: Medicaid Other

## 2013-12-27 ENCOUNTER — Emergency Department (HOSPITAL_COMMUNITY)
Admission: EM | Admit: 2013-12-27 | Discharge: 2013-12-27 | Disposition: A | Discharge status: Home / Self Care | Payer: Medicaid Other | Attending: Emergency Medicine | Admitting: Emergency Medicine

## 2013-12-27 DIAGNOSIS — Z79899 Other long term (current) drug therapy: Secondary | ICD-10-CM | POA: Insufficient documentation

## 2013-12-27 DIAGNOSIS — T83511A Infection and inflammatory reaction due to indwelling urethral catheter, initial encounter: Secondary | ICD-10-CM

## 2013-12-27 DIAGNOSIS — N390 Urinary tract infection, site not specified: Secondary | ICD-10-CM | POA: Diagnosis not present

## 2013-12-27 DIAGNOSIS — Z8669 Personal history of other diseases of the nervous system and sense organs: Secondary | ICD-10-CM | POA: Diagnosis not present

## 2013-12-27 DIAGNOSIS — Y846 Urinary catheterization as the cause of abnormal reaction of the patient, or of later complication, without mention of misadventure at the time of the procedure: Secondary | ICD-10-CM | POA: Insufficient documentation

## 2013-12-27 DIAGNOSIS — T83091A Other mechanical complication of indwelling urethral catheter, initial encounter: Secondary | ICD-10-CM | POA: Diagnosis not present

## 2013-12-27 LAB — URINALYSIS, ROUTINE W REFLEX MICROSCOPIC
Bilirubin Urine: NEGATIVE
Glucose, UA: NEGATIVE mg/dL
KETONES UR: NEGATIVE mg/dL
Nitrite: POSITIVE — AB
Protein, ur: >300 mg/dL — AB
Specific Gravity, Urine: 1.023 (ref 1.005–1.030)
UROBILINOGEN UA: 1 mg/dL (ref 0.0–1.0)
pH: 6.5 (ref 5.0–8.0)

## 2013-12-27 LAB — URINE MICROSCOPIC-ADD ON

## 2013-12-27 MED ORDER — SULFAMETHOXAZOLE-TRIMETHOPRIM 800-160 MG PO TABS: 1.0000 | ORAL_TABLET | Freq: Two times a day (BID) | ORAL | Status: DC

## 2013-12-27 MED ORDER — CEFTRIAXONE SODIUM 1 G IJ SOLR
1.0000 g | Freq: Once | INTRAMUSCULAR | Status: AC
  Administered 2013-12-27: 1 g via INTRAMUSCULAR
  Filled 2013-12-27: qty 10

## 2013-12-27 MED ORDER — NAPROXEN 500 MG PO TABS: 500.0000 mg | ORAL_TABLET | Freq: Two times a day (BID) | ORAL | Status: DC

## 2013-12-27 MED ORDER — IOHEXOL 300 MG/ML  SOLN
50.0000 mL | Freq: Once | INTRAMUSCULAR | Status: AC | PRN
  Administered 2013-12-27: 30 mL via ORAL

## 2013-12-27 NOTE — ED Provider Notes (Signed)
CSN: [id number]     Arrival date & time 12/27/13  1255 History   First MD Initiated Contact with Patient 12/27/13 1330     Chief Complaint  Patient presents with  . Suprapubic catheter check      (Consider location/radiation/quality/duration/timing/severity/associated sxs/prior Treatment) Patient is a 48 y.o. female presenting with general illness. The history is provided by the patient.  Illness Location:  Suprapubic catheter Quality:  Malfunction, balloon not working Severity:  Mild Onset quality:  Sudden Timing:  Constant Chronicity:  New Relieved by:  Nothing Worsened by:  Nothing Associated symptoms: no abdominal pain, no fever and no vomiting     Past Medical History  Diagnosis Date  . Paraplegia (lower)    Past Surgical History  Procedure Laterality Date  . Back surgery     No family history on file. History  Substance Use Topics  . Smoking status: Never Smoker   . Smokeless tobacco: Not on file  . Alcohol Use: No   OB History   Grav Para Term Preterm Abortions TAB SAB Ect Mult Living                 Review of Systems  Constitutional: Negative for fever.  Gastrointestinal: Negative for vomiting and abdominal pain.  All other systems reviewed and are negative.     Allergies  Quinine derivatives  Home Medications   Prior to Admission medications   Medication Sig Start Date End Date Taking? Authorizing Provider  baclofen (LIORESAL) 20 MG tablet Take 40 mg by mouth 4 (four) times daily.    Yes Historical Provider, MD  docusate sodium (COLACE) 100 MG capsule Take 100 mg by mouth at bedtime.   Yes Historical Provider, MD  fesoterodine (TOVIAZ) 4 MG TB24 Take 1 tablet (4 mg total) by mouth daily. 06/30/12  Yes [name], MD  furosemide (LASIX) 40 MG tablet Take 40 mg by mouth daily.   Yes Historical Provider, MD  gabapentin (NEURONTIN) 300 MG capsule Take 300 mg by mouth 3 (three) times daily.    Yes Historical Provider, MD   HYDROcodone-acetaminophen (NORCO/VICODIN) 5-325 MG per tablet Take 2 tablets by mouth 2 (two) times daily as needed for moderate pain or severe pain. Pain. 06/05/12  Yes Historical Provider, MD  lacosamide 100 MG TABS Take 1 tablet (100 mg total) by mouth 2 (two) times daily. 06/16/13  Yes [name], MD  Multiple Vitamin (MULTIVITAMIN) tablet Take 1 tablet by mouth daily.   Yes Historical Provider, MD  potassium chloride SA (K-DUR,KLOR-CON) 20 MEQ tablet Take 20 mEq by mouth daily.   Yes Historical Provider, MD  sulfamethoxazole-trimethoprim (SEPTRA DS) 800-160 MG per tablet Take 1 tablet by mouth every 12 (twelve) hours. 12/27/13   Osvaldo Shipper, MD   BP 117/68  Pulse 73  Temp(Src) 98.1 F (36.7 C) (Oral)  Resp 18  SpO2 97% Physical Exam  Nursing note and vitals reviewed. Constitutional: She is oriented to person, place, and time. She appears well-developed and well-nourished. No distress.  HENT:  Head: Normocephalic and atraumatic.  Mouth/Throat: Oropharynx is clear and moist.  Eyes: EOM are normal. Pupils are equal, round, and reactive to light.  Neck: Normal range of motion. Neck supple.  Cardiovascular: Normal rate and regular rhythm.  Exam reveals no friction rub.   No murmur heard. Pulmonary/Chest: Effort normal and breath sounds normal. No respiratory distress. She has no wheezes. She has no rales.  Abdominal: Soft. She exhibits distension. There is no tenderness. There is  no rebound.  Suprapubic catheter site on lower abdomen without any surrounding erythema, discharge.   Musculoskeletal: Normal range of motion. She exhibits no edema.  Neurological: She is alert and oriented to person, place, and time.  Skin: No rash noted. She is not diaphoretic.    ED Course  SUPRAPUBIC TUBE PLACEMENT Date/Time: 12/27/2013 3:29 PM Performed by: Osvaldo Shipper Authorized by: Osvaldo Shipper Consent: Verbal consent obtained. Indications: catheter change Indications  comment: Suprapubic catheter fell out Local anesthesia used: no Patient sedated: no Preparation: Patient was prepped and draped in the usual sterile fashion. Number of attempts: 1 Urine characteristics: clear Patient tolerance: Patient tolerated the procedure well with no immediate complications. Comments: Suprapubic catheter stoma in place for long time. Patient's suprapubic catheter here removed because balloon not working.    (including critical care time) Labs Review Labs Reviewed  URINALYSIS, ROUTINE W REFLEX MICROSCOPIC - Abnormal; Notable for the following:    Color, Urine AMBER (*)    APPearance CLOUDY (*)    Hgb urine dipstick LARGE (*)    Protein, ur >300 (*)    Nitrite POSITIVE (*)    Leukocytes, UA MODERATE (*)    All other components within normal limits  URINE MICROSCOPIC-ADD ON - Abnormal; Notable for the following:    Squamous Epithelial / LPF MANY (*)    Bacteria, UA MANY (*)    Casts HYALINE CASTS (*)    All other components within normal limits    Imaging Review No results found.   EKG Interpretation None      MDM   Final diagnoses:  Urinary tract infection associated with catheterization of urinary tract, initial encounter    38F sent for suprapubic foley complication. I spoke with nurse at the nursing home, had black stuff in her Foley upon change. There is concern for the blue not working. On arrival, for 16-gauge Foley in place, however it is easily removed because balloon was down. I changed the Foley out at the bedside without any complication. 16-gauge Foley put in by me, see procedure note above. Urine sent, shows UTI. IM Rocephin given, soma Bactrim. Will do x-ray to ensure placement of the suprapubic catheter.  Xray ok. Stable for discharge.  Osvaldo Shipper, MD 12/27/13 [phone]

## 2013-12-27 NOTE — ED Notes (Signed)
Pt had suprapubic cathether changed this morning and nurse flushed catheter and felt resistance; shortly after pt had small amount vaginal bleeding;

## 2013-12-27 NOTE — ED Notes (Signed)
[facility] here to take pt back to her residence.

## 2013-12-27 NOTE — Discharge Instructions (Signed)
Catheter-Associated Urinary Tract Infection FAQs °WHAT IS "CATHETER-ASSOCIATED" URINARY TRACT INFECTION? °A urinary tract infection (also called "UTI") is an infection in the urinary system, which includes the bladder (which stores the urine) and the kidneys (which filter the blood to make urine). Germs (for example, bacteria or yeasts) do not normally live in these areas; but if germs are introduced, an infection can occur. If you have a urinary catheter, germs can travel along the catheter and cause an infection in your bladder or your kidney; in that case it is called a catheter-associated urinary tract infection (or "CA-UTI").  °WHAT IS A URINARY CATHETER? °A urinary catheter is a thin tube placed in the bladder to drain urine. Urine drains through the tube into a bag that collects the urine. A urinary  °catheter may be used: °· If you are not able to urinate on your own. °· To measure the amount of urine that you make, for example, during intensive care. °· During and after some types of surgery. °· During some tests of the kidneys and bladder . °People with urinary catheters have a much higher chance of getting a urinary tract infection than people who don't have a catheter. °HOW DO I GET A CATHETER-ASSOCIATED URINARY TRACT INFECTION (CA-UTI)? °If germs enter the urinary tract, they may cause an infection. Many of the germs that cause a catheter-associated urinary tract infection are common germs found in your intestines that do not usually cause an infection there. Germs can enter the urinary tract when the catheter is being put in or while the catheter remains in the bladder.  °WHAT ARE THE SYMPTOMS OF A URINARY TRACT INFECTION?  °Some of the common symptoms of a urinary tract infection are: °· Burning or pain in the lower abdomen (that is, below the stomach). °· Fever. °· Bloody urine may be a sign of infection, but is also caused by other problems . °· Burning during urination or an increase in the  frequency of urination after the catheter is removed. °Sometimes people with catheter-associated urinary tract infections do not have these symptoms of infection. °CAN CATHETER-ASSOCIATED URINARY TRACT INFECTIONS BE TREATED? °Yes, most catheter-associated urinary tract infections can be treated with antibiotics and removal or change of the catheter. Your doctor will determine which antibiotic is best for you.  °WHAT ARE SOME OF THE THINGS THAT HOSPITALS ARE DOING TO PREVENT CATHETER-ASSOCIATED URINARY TRACT INFECTIONS? °To prevent urinary tract infections, doctors and nurses take the following actions.  °Catheter insertion °· Catheters are put in only when necessary and they are removed as soon as possible. °· Only properly trained persons insert catheters using sterile ("clean") technique. °· The skin in the area where the catheter will be inserted is cleaned before inserting the catheter. °· Other methods to drain the urine are sometimes used, such as: °¨ External catheters in men (these look like condoms and are placed over the penis rather than into the penis) °¨ Putting a temporary catheter in to drain the urine and removing it right away. This is called intermittent urethral catheterization. °Catheter care °· Healthcare providers clean their hands by washing them with soap and water or using an alcohol-based hand rub before and after touching your catheter. °¨ If you do not see your providers clean their hands, please ask them to do so. °· Avoid disconnecting the catheter and drain tube. This helps to prevent germs from getting into the catheter tube. °· The catheter is secured to the leg to prevent pulling on the   catheter. °· Avoid twisting or kinking the catheter. °· Keep the bag lower than the bladder to prevent urine from backflowing to the bladder. °· Empty the bag regularly. The drainage spout should not touch anything while emptying the bag. °WHAT CAN I DO TO HELP PREVENT CATHETER-ASSOCIATED URINARY  TRACT INFECTIONS IF I HAVE A CATHETER? °· Always clean your hands before and after doing catheter care. °· Always keep your urine bag below the level of your bladder. °· Do not tug or pull on the tubing. °· Do not twist or kink the catheter tubing. °· Ask your healthcare provider each day if you still need the catheter. °WHAT DO I NEED TO DO WHEN I GO HOME FROM THE HOSPITAL? °· If you will be going home with a catheter, your doctor or nurse should explain everything you need to know about taking care of the catheter. Make sure you understand how to care for it before you leave the hospital. °· If you develop any of the symptoms of a urinary tract infection, such as burning or pain in the lower abdomen, fever, or an increase in the frequency of urination, contact your doctor or nurse immediately. °· Before you go home, make sure you know who to contact if you have questions or problems after you get home. °If you have questions, please ask your doctor or nurse. °Developed and co-sponsored by The Society for Healthcare Epidemiology of America (SHEA); Infectious Diseases Society of America (IDSA); The American Hospital Association; Association for Professionals in Infection Control and Epidemiology (APIC); Center for Disease Control (CDC); and The Joint Commission °Document Released: 02/05/2012 Document Reviewed: 02/05/2012 °ExitCare® Patient Information ©2015 [employer]. This information is not intended to replace advice given to you by your health care provider. Make sure you discuss any questions you have with your health care provider. ° °

## 2014-05-02 ENCOUNTER — Inpatient Hospital Stay (HOSPITAL_COMMUNITY)
Admission: EM | Admit: 2014-05-02 | Discharge: 2014-05-10 | DRG: 100 | Disposition: A | Discharge status: Home / Self Care | Payer: Medicaid Other | Attending: Internal Medicine | Admitting: Internal Medicine

## 2014-05-02 ENCOUNTER — Encounter (HOSPITAL_COMMUNITY): Payer: Self-pay | Admitting: Emergency Medicine

## 2014-05-02 ENCOUNTER — Emergency Department (HOSPITAL_COMMUNITY): Payer: Medicaid Other

## 2014-05-02 DIAGNOSIS — G40901 Epilepsy, unspecified, not intractable, with status epilepticus: Secondary | ICD-10-CM | POA: Diagnosis present

## 2014-05-02 DIAGNOSIS — Z23 Encounter for immunization: Secondary | ICD-10-CM

## 2014-05-02 DIAGNOSIS — Z0189 Encounter for other specified special examinations: Secondary | ICD-10-CM

## 2014-05-02 DIAGNOSIS — I10 Essential (primary) hypertension: Secondary | ICD-10-CM | POA: Diagnosis not present

## 2014-05-02 DIAGNOSIS — J9601 Acute respiratory failure with hypoxia: Secondary | ICD-10-CM | POA: Diagnosis present

## 2014-05-02 DIAGNOSIS — J9600 Acute respiratory failure, unspecified whether with hypoxia or hypercapnia: Secondary | ICD-10-CM | POA: Diagnosis present

## 2014-05-02 DIAGNOSIS — G9389 Other specified disorders of brain: Secondary | ICD-10-CM | POA: Diagnosis not present

## 2014-05-02 DIAGNOSIS — G40301 Generalized idiopathic epilepsy and epileptic syndromes, not intractable, with status epilepticus: Secondary | ICD-10-CM

## 2014-05-02 DIAGNOSIS — G40909 Epilepsy, unspecified, not intractable, without status epilepticus: Secondary | ICD-10-CM | POA: Insufficient documentation

## 2014-05-02 DIAGNOSIS — I161 Hypertensive emergency: Secondary | ICD-10-CM | POA: Diagnosis present

## 2014-05-02 DIAGNOSIS — G8220 Paraplegia, unspecified: Secondary | ICD-10-CM | POA: Diagnosis present

## 2014-05-02 DIAGNOSIS — Z79899 Other long term (current) drug therapy: Secondary | ICD-10-CM | POA: Diagnosis not present

## 2014-05-02 DIAGNOSIS — R569 Unspecified convulsions: Secondary | ICD-10-CM

## 2014-05-02 DIAGNOSIS — N390 Urinary tract infection, site not specified: Secondary | ICD-10-CM | POA: Diagnosis not present

## 2014-05-02 DIAGNOSIS — I639 Cerebral infarction, unspecified: Secondary | ICD-10-CM

## 2014-05-02 DIAGNOSIS — Z9359 Other cystostomy status: Secondary | ICD-10-CM

## 2014-05-02 DIAGNOSIS — G9340 Encephalopathy, unspecified: Secondary | ICD-10-CM | POA: Diagnosis present

## 2014-05-02 HISTORY — DX: Epilepsy, unspecified, not intractable, without status epilepticus: G40.909

## 2014-05-02 LAB — URINALYSIS, ROUTINE W REFLEX MICROSCOPIC
Bilirubin Urine: NEGATIVE
Glucose, UA: 250 mg/dL — AB
Ketones, ur: NEGATIVE mg/dL
Leukocytes, UA: NEGATIVE
Nitrite: POSITIVE — AB
Protein, ur: >300 mg/dL — AB
SPECIFIC GRAVITY, URINE: 1.02 (ref 1.005–1.030)
Urobilinogen, UA: 0.2 mg/dL (ref 0.0–1.0)
pH: 8 (ref 5.0–8.0)

## 2014-05-02 LAB — BLOOD GAS, ARTERIAL
ACID-BASE DEFICIT: 0.4 mmol/L (ref 0.0–2.0)
Bicarbonate: 23.4 mEq/L (ref 20.0–24.0)
DRAWN BY: 23534
FIO2: 0.35 %
LHR: 14 {breaths}/min
O2 CONTENT: 35 L/min
O2 SAT: 96.8 %
PATIENT TEMPERATURE: 37
PEEP/CPAP: 5 cmH2O
TCO2: 20.7 mmol/L (ref 0–100)
VT: 500 mL
pCO2 arterial: 35.9 mmHg (ref 35.0–45.0)
pH, Arterial: 7.429 (ref 7.350–7.450)
pO2, Arterial: 82.4 mmHg (ref 80.0–100.0)

## 2014-05-02 LAB — LIPID PANEL
Cholesterol: 384 mg/dL — ABNORMAL HIGH (ref 0–200)
HDL: 42 mg/dL (ref >39)
LDL Cholesterol: UNDETERMINED mg/dL (ref 0–99)
TRIGLYCERIDES: 412 mg/dL — AB (ref <150)
Total CHOL/HDL Ratio: 9.1 RATIO
VLDL: UNDETERMINED mg/dL (ref 0–40)

## 2014-05-02 LAB — URINE MICROSCOPIC-ADD ON

## 2014-05-02 LAB — BASIC METABOLIC PANEL
Anion gap: 12 (ref 5–15)
BUN: 12 mg/dL (ref 6–23)
CHLORIDE: 102 meq/L (ref 96–112)
CO2: 25 mEq/L (ref 19–32)
CREATININE: 1.06 mg/dL (ref 0.50–1.10)
Calcium: 8.7 mg/dL (ref 8.4–10.5)
GFR calc Af Amer: 72 mL/min — ABNORMAL LOW (ref >90)
GFR calc non Af Amer: 62 mL/min — ABNORMAL LOW (ref >90)
GLUCOSE: 143 mg/dL — AB (ref 70–99)
POTASSIUM: 3.9 meq/L (ref 3.7–5.3)
Sodium: 139 mEq/L (ref 137–147)

## 2014-05-02 LAB — RAPID URINE DRUG SCREEN, HOSP PERFORMED
AMPHETAMINES: NOT DETECTED
BENZODIAZEPINES: NOT DETECTED
Barbiturates: NOT DETECTED
Cocaine: NOT DETECTED
OPIATES: NOT DETECTED
Tetrahydrocannabinol: NOT DETECTED

## 2014-05-02 LAB — CBC WITH DIFFERENTIAL/PLATELET
BASOS PCT: 1 % (ref 0–1)
Basophils Absolute: 0.1 10*3/uL (ref 0.0–0.1)
Eosinophils Absolute: 0 10*3/uL (ref 0.0–0.7)
Eosinophils Relative: 0 % (ref 0–5)
HEMATOCRIT: 42.4 % (ref 36.0–46.0)
Hemoglobin: 14 g/dL (ref 12.0–15.0)
LYMPHS ABS: 1 10*3/uL (ref 0.7–4.0)
Lymphocytes Relative: 10 % — ABNORMAL LOW (ref 12–46)
MCH: 29 pg (ref 26.0–34.0)
MCHC: 33 g/dL (ref 30.0–36.0)
MCV: 87.8 fL (ref 78.0–100.0)
MONO ABS: 0.5 10*3/uL (ref 0.1–1.0)
MONOS PCT: 4 % (ref 3–12)
NEUTROS ABS: 9.2 10*3/uL — AB (ref 1.7–7.7)
NEUTROS PCT: 85 % — AB (ref 43–77)
Platelets: 228 10*3/uL (ref 150–400)
RBC: 4.83 MIL/uL (ref 3.87–5.11)
RDW: 14.1 % (ref 11.5–15.5)
WBC: 10.8 10*3/uL — ABNORMAL HIGH (ref 4.0–10.5)

## 2014-05-02 LAB — HEPATIC FUNCTION PANEL
ALT: 16 U/L (ref 0–35)
AST: 26 U/L (ref 0–37)
Albumin: 1.9 g/dL — ABNORMAL LOW (ref 3.5–5.2)
Alkaline Phosphatase: 102 U/L (ref 39–117)
Bilirubin, Direct: <0.2 mg/dL (ref 0.0–0.3)
TOTAL PROTEIN: 6 g/dL (ref 6.0–8.3)
Total Bilirubin: 0.3 mg/dL (ref 0.3–1.2)

## 2014-05-02 LAB — LACTIC ACID, PLASMA: Lactic Acid, Venous: 2.5 mmol/L — ABNORMAL HIGH (ref 0.5–2.2)

## 2014-05-02 LAB — TROPONIN I

## 2014-05-02 MED ORDER — LABETALOL HCL 5 MG/ML IV SOLN
10.0000 mg | Freq: Once | INTRAVENOUS | Status: AC
  Administered 2014-05-02: 10 mg via INTRAVENOUS
  Filled 2014-05-02: qty 4

## 2014-05-02 MED ORDER — PROPOFOL 10 MG/ML IV EMUL
5.0000 ug/kg/min | Freq: Once | INTRAVENOUS | Status: AC
  Administered 2014-05-02: 5 ug/kg/min via INTRAVENOUS
  Filled 2014-05-02: qty 100

## 2014-05-02 MED ORDER — LIDOCAINE HCL (CARDIAC) 20 MG/ML IV SOLN
INTRAVENOUS | Status: AC
  Filled 2014-05-02: qty 5

## 2014-05-02 MED ORDER — LEVETIRACETAM IN NACL 500 MG/100ML IV SOLN
INTRAVENOUS | Status: AC
  Filled 2014-05-02: qty 100

## 2014-05-02 MED ORDER — NICARDIPINE HCL IN NACL 20-0.86 MG/200ML-% IV SOLN
3.0000 mg/h | Freq: Once | INTRAVENOUS | Status: AC
  Administered 2014-05-02: 5 mg/h via INTRAVENOUS
  Filled 2014-05-02: qty 200

## 2014-05-02 MED ORDER — ROCURONIUM BROMIDE 50 MG/5ML IV SOLN
INTRAVENOUS | Status: AC
  Administered 2014-05-02: 100 mg
  Filled 2014-05-02: qty 2

## 2014-05-02 MED ORDER — LORAZEPAM 2 MG/ML IJ SOLN
INTRAMUSCULAR | Status: DC
Start: 2014-05-02 — End: 2014-05-03
  Filled 2014-05-02: qty 2

## 2014-05-02 MED ORDER — LORAZEPAM 2 MG/ML IJ SOLN
INTRAMUSCULAR | Status: AC
  Administered 2014-05-02: 18:00:00 1 mg via INTRAVENOUS
  Filled 2014-05-02: qty 1

## 2014-05-02 MED ORDER — SUCCINYLCHOLINE CHLORIDE 20 MG/ML IJ SOLN
INTRAMUSCULAR | Status: AC
  Filled 2014-05-02: qty 1

## 2014-05-02 MED ORDER — LEVETIRACETAM IN NACL 500 MG/100ML IV SOLN
500.0000 mg | Freq: Three times a day (TID) | INTRAVENOUS | Status: DC
  Administered 2014-05-03 – 2014-05-06 (×10): 500 mg via INTRAVENOUS
  Filled 2014-05-02 (×18): qty 100

## 2014-05-02 MED ORDER — LORAZEPAM 2 MG/ML IJ SOLN
4.0000 mg | Freq: Once | INTRAMUSCULAR | Status: AC
  Administered 2014-05-02: 4 mg via INTRAVENOUS

## 2014-05-02 MED ORDER — ETOMIDATE 2 MG/ML IV SOLN
INTRAVENOUS | Status: AC
  Administered 2014-05-02: 10 mg
  Filled 2014-05-02: qty 20

## 2014-05-02 MED ORDER — LORAZEPAM 2 MG/ML IJ SOLN
1.0000 mg | Freq: Once | INTRAMUSCULAR | Status: AC
Start: 2014-05-02 — End: 2014-05-02
  Administered 2014-05-02: 1 mg via INTRAVENOUS

## 2014-05-02 MED ORDER — LEVETIRACETAM IN NACL 500 MG/100ML IV SOLN
INTRAVENOUS | Status: AC
Start: 2014-05-02 — End: 2014-05-02
  Filled 2014-05-02: qty 100

## 2014-05-02 MED ORDER — ASPIRIN 325 MG PO TABS: 325.0000 mg | ORAL_TABLET | Freq: Every day | ORAL | Status: DC

## 2014-05-02 MED ORDER — SODIUM CHLORIDE 0.9 % IV SOLN
150.0000 mg | Freq: Two times a day (BID) | INTRAVENOUS | Status: DC
  Administered 2014-05-03 – 2014-05-05 (×7): 150 mg via INTRAVENOUS
  Filled 2014-05-02 (×15): qty 15

## 2014-05-02 MED ORDER — LEVETIRACETAM IN NACL 500 MG/100ML IV SOLN
500.0000 mg | Freq: Once | INTRAVENOUS | Status: AC
  Administered 2014-05-02: 500 mg via INTRAVENOUS
  Filled 2014-05-02: qty 100

## 2014-05-02 MED ORDER — LACOSAMIDE 200 MG/20ML IV SOLN
INTRAVENOUS | Status: AC
  Filled 2014-05-02: qty 20

## 2014-05-02 MED ORDER — DEXTROSE 5 % IV SOLN
1.0000 g | INTRAVENOUS | Status: AC
  Administered 2014-05-02 – 2014-05-06 (×5): 1 g via INTRAVENOUS
  Filled 2014-05-02 (×6): qty 10

## 2014-05-02 NOTE — ED Notes (Signed)
MD at bedside. 
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
## 2014-05-02 NOTE — ED Notes (Signed)
RSI at bedside with verbal orders for 100 roc and 10mg  of atimidate.

## 2014-05-02 NOTE — ED Notes (Signed)
AC called by charge nurse for keppra to be delivered to ED.

## 2014-05-02 NOTE — ED Notes (Signed)
7.5 et tube inserted with 21 at the teeth.

## 2014-05-02 NOTE — ED Notes (Addendum)
Periods of apnea, moved to room 2 for closer observation
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
## 2014-05-02 NOTE — ED Notes (Signed)
Still awaiting Keppra from [facility].

## 2014-05-02 NOTE — ED Notes (Signed)
Patient arrives via EMS from home c/o witnessed seizure by family member. Reports upper body shaking. Arrives lethargic but alert, follows simple commands.

## 2014-05-03 ENCOUNTER — Inpatient Hospital Stay (HOSPITAL_COMMUNITY): Payer: Medicaid Other

## 2014-05-03 LAB — POCT I-STAT 3, ART BLOOD GAS (G3+)
Acid-Base Excess: 3 mmol/L — ABNORMAL HIGH (ref 0.0–2.0)
Bicarbonate: 24.8 mEq/L — ABNORMAL HIGH (ref 20.0–24.0)
O2 Saturation: 99 %
PCO2 ART: 29.2 mmHg — AB (ref 35.0–45.0)
PO2 ART: 123 mmHg — AB (ref 80.0–100.0)
Patient temperature: 98.3
TCO2: 26 mmol/L (ref 0–100)
pH, Arterial: 7.536 — ABNORMAL HIGH (ref 7.350–7.450)

## 2014-05-03 LAB — BLOOD GAS, ARTERIAL
ACID-BASE DEFICIT: 0.1 mmol/L (ref 0.0–2.0)
BICARBONATE: 24.1 meq/L — AB (ref 20.0–24.0)
Drawn by: 347621
FIO2: 0.3 %
MECHVT: 300 mL
O2 Saturation: 97.2 %
PATIENT TEMPERATURE: 98.9
PEEP/CPAP: 5 cmH2O
PO2 ART: 99.6 mmHg (ref 80.0–100.0)
RATE: 15 resp/min
TCO2: 25.3 mmol/L (ref 0–100)
pCO2 arterial: 39.9 mmHg (ref 35.0–45.0)
pH, Arterial: 7.399 (ref 7.350–7.450)

## 2014-05-03 LAB — GLUCOSE, CAPILLARY
GLUCOSE-CAPILLARY: 70 mg/dL (ref 70–99)
GLUCOSE-CAPILLARY: 70 mg/dL (ref 70–99)
GLUCOSE-CAPILLARY: 84 mg/dL (ref 70–99)
GLUCOSE-CAPILLARY: 90 mg/dL (ref 70–99)
Glucose-Capillary: 61 mg/dL — ABNORMAL LOW (ref 70–99)
Glucose-Capillary: 71 mg/dL (ref 70–99)

## 2014-05-03 LAB — CBC
HEMATOCRIT: 37.9 % (ref 36.0–46.0)
HEMOGLOBIN: 12.8 g/dL (ref 12.0–15.0)
MCH: 29.8 pg (ref 26.0–34.0)
MCHC: 33.8 g/dL (ref 30.0–36.0)
MCV: 88.1 fL (ref 78.0–100.0)
Platelets: 223 10*3/uL (ref 150–400)
RBC: 4.3 MIL/uL (ref 3.87–5.11)
RDW: 14.2 % (ref 11.5–15.5)
WBC: 9 10*3/uL (ref 4.0–10.5)

## 2014-05-03 LAB — CREATININE, SERUM
Creatinine, Ser: 1.15 mg/dL — ABNORMAL HIGH (ref 0.50–1.10)
GFR calc Af Amer: 65 mL/min — ABNORMAL LOW (ref >90)
GFR, EST NON AFRICAN AMERICAN: 56 mL/min — AB (ref >90)

## 2014-05-03 LAB — MRSA PCR SCREENING: MRSA BY PCR: NEGATIVE

## 2014-05-03 MED ORDER — LORAZEPAM 2 MG/ML IJ SOLN
2.0000 mg | INTRAMUSCULAR | Status: DC | PRN
  Administered 2014-05-03: 2 mg via INTRAVENOUS
  Filled 2014-05-03: qty 1

## 2014-05-03 MED ORDER — ONDANSETRON HCL 4 MG/2ML IJ SOLN: 4.0000 mg | Freq: Three times a day (TID) | INTRAMUSCULAR | Status: AC | PRN

## 2014-05-03 MED ORDER — PANTOPRAZOLE SODIUM 40 MG IV SOLR
40.0000 mg | Freq: Every day | INTRAVENOUS | Status: DC
  Administered 2014-05-03 – 2014-05-05 (×4): 40 mg via INTRAVENOUS
  Filled 2014-05-03 (×4): qty 40

## 2014-05-03 MED ORDER — PROPOFOL 10 MG/ML IV EMUL
INTRAVENOUS | Status: AC
  Administered 2014-05-03: 06:00:00
  Filled 2014-05-03: qty 100

## 2014-05-03 MED ORDER — SODIUM CHLORIDE 0.9 % IV SOLN
INTRAVENOUS | Status: DC
  Administered 2014-05-03 – 2014-05-05 (×3): via INTRAVENOUS

## 2014-05-03 MED ORDER — SODIUM CHLORIDE 0.9 % IV SOLN: INTRAVENOUS | Status: DC

## 2014-05-03 MED ORDER — LEVETIRACETAM IN NACL 1000 MG/100ML IV SOLN
1000.0000 mg | Freq: Once | INTRAVENOUS | Status: AC
  Administered 2014-05-03: 1000 mg via INTRAVENOUS
  Filled 2014-05-03: qty 100

## 2014-05-03 MED ORDER — CHLORHEXIDINE GLUCONATE 0.12 % MT SOLN
OROMUCOSAL | Status: AC
  Administered 2014-05-03: 15 mL via OROMUCOSAL
  Filled 2014-05-03: qty 15

## 2014-05-03 MED ORDER — PROPOFOL 10 MG/ML IV EMUL
5.0000 ug/kg/min | INTRAVENOUS | Status: DC
  Filled 2014-05-03: qty 100

## 2014-05-03 MED ORDER — SODIUM CHLORIDE 0.9 % IV SOLN: 250.0000 mL | INTRAVENOUS | Status: DC | PRN

## 2014-05-03 MED ORDER — PROPOFOL 10 MG/ML IV EMUL
INTRAVENOUS | Status: AC
  Administered 2014-05-03: 30 ug/kg/min via INTRAVENOUS
  Filled 2014-05-03: qty 100

## 2014-05-03 MED ORDER — CETYLPYRIDINIUM CHLORIDE 0.05 % MT LIQD
7.0000 mL | Freq: Four times a day (QID) | OROMUCOSAL | Status: DC
  Administered 2014-05-03 – 2014-05-05 (×8): 7 mL via OROMUCOSAL

## 2014-05-03 MED ORDER — HEPARIN SODIUM (PORCINE) 5000 UNIT/ML IJ SOLN
5000.0000 [IU] | Freq: Three times a day (TID) | INTRAMUSCULAR | Status: DC
  Administered 2014-05-03 – 2014-05-10 (×23): 5000 [IU] via SUBCUTANEOUS
  Filled 2014-05-03 (×26): qty 1

## 2014-05-03 MED ORDER — CHLORHEXIDINE GLUCONATE 0.12 % MT SOLN
15.0000 mL | Freq: Two times a day (BID) | OROMUCOSAL | Status: DC
  Administered 2014-05-03 – 2014-05-05 (×6): 15 mL via OROMUCOSAL
  Filled 2014-05-03 (×5): qty 15

## 2014-05-03 MED ORDER — PROPOFOL 10 MG/ML IV EMUL
5.0000 ug/kg/min | Freq: Once | INTRAVENOUS | Status: AC
  Administered 2014-05-03: 30 ug/kg/min via INTRAVENOUS

## 2014-05-03 MED ORDER — NICARDIPINE HCL IN NACL 20-0.86 MG/200ML-% IV SOLN
3.0000 mg/h | Freq: Once | INTRAVENOUS | Status: AC
  Administered 2014-05-05: 5 mg/h via INTRAVENOUS
  Filled 2014-05-03 (×2): qty 200

## 2014-05-03 MED ORDER — ONDANSETRON HCL 4 MG/2ML IJ SOLN: 4.0000 mg | Freq: Three times a day (TID) | INTRAMUSCULAR | Status: DC | PRN

## 2014-05-03 MED ORDER — DEXTROSE 50 % IV SOLN
INTRAVENOUS | Status: AC
  Administered 2014-05-03: 50 mL
  Filled 2014-05-03: qty 50

## 2014-05-03 MED ORDER — HYDRALAZINE HCL 20 MG/ML IJ SOLN
10.0000 mg | Freq: Four times a day (QID) | INTRAMUSCULAR | Status: DC | PRN
  Administered 2014-05-03 – 2014-05-04 (×3): 10 mg via INTRAMUSCULAR
  Filled 2014-05-03 (×4): qty 1

## 2014-05-03 NOTE — Progress Notes (Signed)
UR Completed.  [phone]  

## 2014-05-03 NOTE — Consult Note (Signed)
Neurology Consultation Reason for Consult: seizures Referring Physician: Elsworth Soho, R  CC: Seizures  History is obtained from:medical record, sister  HPI: [name] is a 48 y.o. female with a history of seizures diagnosed approximately 1 year ago. In November 2014, she had loss of consciousness and was found unresponsive and with that hospitalization began having seizures. She was maintained on Vimpat 100 mg twice a day. She had an MRI performed in January of this year which showed a remote right frontal infarct.   Earlier today she had a several minute long seizure and was taken to [facility] ER.    LKW: unclear tpa given?: no, outside of window    ROS:  Unable to obtain due to altered mental status.   Past Medical History  Diagnosis Date  . Paraplegia (lower)   . Seizure disorder     Family History: Unable to assess secondary to patient's altered mental status.   Social History: Tob: Unable to assess secondary to patient's altered mental status.    Exam: Current vital signs: BP 165/108 mmHg  Pulse 110  Temp(Src) 98.3 F (36.8 C) (Oral)  Resp 20  Wt 79.379 kg (175 lb)  SpO2 97%  LMP  (LMP Unknown) Vital signs in last 24 hours: Temp:  [97.7 F (36.5 C)-98.3 F (36.8 C)] 98.3 F (36.8 C) (12/08 0013) Pulse Rate:  [81-120] 110 (12/07 2345) Resp:  [12-29] 20 (12/08 0130) BP: (163-192)/(103-134) 165/108 mmHg (12/08 0100) SpO2:  [96 %-100 %] 97 % (12/07 2345) Arterial Line BP: (92-333)/(76-211) 92/76 mmHg (12/08 0130) FiO2 (%):  [30 %-35 %] 30 % (12/08 0000) Weight:  [79.379 kg (175 lb)] 79.379 kg (175 lb) (12/07 1713)  General: in bed, intubated  Physical Exam  Constitutional: Appears well-developed and well-nourished.  Psych: sedated Eyes: No scleral injection HENT: ET tube in place Head: Normocephalic.  Cardiovascular: Normal rate and regular rhythm.  Respiratory: intubated GI: Soft.  No distension. There is no tenderness.  Skin: WDI  Neuro: Mental  Status: Patient is sedated, but does rouse to noxious stimuli. She squeezes fingers on command, but does not follow any more complex commands without  Cranial Nerves: II: Appears to fixate, but does not blink to threat. Pupils are equal, round, and reactive to light.   III,IV, VI: she does fixate and tracks minimally across midline in both direction.  V:VII: blinks to eyelid stim bilaterally VIII: hearing is intact to voice X: Uvula elevates symmetrically XI: Shoulder shrug is symmetric. XII: tongue is midline without atrophy or fasciculations.  Motor: Tone is normal in UE. Bulk is normal. She queezes fingers bilaterally when hand is placed in, but does not clearly follow verbal commands. Baseline paraplegia.  Sensory: Responds to nox stim in both UE Cerebellar: Unable to assess secondary to patient's altered mental status.  Gait: Unable to assess secondary to patient's altered mental status.      I have reviewed labs in epic and the results pertinent to this consultation are: High triglycerides, but unclear if fasting.   I have reviewed the images obtained:CT head - hypodensity in the left posterior quadrant.   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 I am concerned that she may have some degree of aphasia, though it is unclear to me if the hypodensity truly is a stroke. An MRI could help to better clarify. I will also request an EEG. It is unclear if she was compliant with vimpat per Dr. Freddie Apley note, but will continue two  agents for now.   Recommendations: 1. HgbA1c, fasting lipid panel 2. MRI, MRA  of the brain without contrast 3. Frequent neuro checks 4. Echocardiogram 5. Carotid dopplers 6. Prophylactic therapy-Antiplatelet med: Aspirin - dose 325mg  PO or 300mg  PR 7. Risk factor modification 8. Telemetry monitoring 9. PT consult, OT consult, Speech consult 10 Vimpat 150mg  Q12H 11. Keppra 500mg  BID 12. EEG   Roland Rack, MD [facility] [phone]  If 7pm- 7am, please page neurology on call as listed in [place].

## 2014-05-03 NOTE — Progress Notes (Signed)
EEG completed; results pending.    
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
## 2014-05-03 NOTE — Progress Notes (Signed)
RN says neuro wants diprivan restarted  Plan Restart diprivan gtt order sent  Dr. Brand Males, M.D., [facility].C.P Pulmonary and Critical Care Medicine Staff Physician [place] [facility] Pager: [phone], If no answer or between  15:00h - 7:00h: call [phone]  05/03/2014 1:07 AM
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
## 2014-05-04 ENCOUNTER — Inpatient Hospital Stay (HOSPITAL_COMMUNITY): Payer: Medicaid Other

## 2014-05-04 DIAGNOSIS — R569 Unspecified convulsions: Secondary | ICD-10-CM

## 2014-05-04 DIAGNOSIS — I319 Disease of pericardium, unspecified: Secondary | ICD-10-CM

## 2014-05-04 DIAGNOSIS — G8220 Paraplegia, unspecified: Secondary | ICD-10-CM

## 2014-05-04 DIAGNOSIS — G40909 Epilepsy, unspecified, not intractable, without status epilepticus: Secondary | ICD-10-CM | POA: Insufficient documentation

## 2014-05-04 LAB — CBC
HEMATOCRIT: 33.2 % — AB (ref 36.0–46.0)
HEMOGLOBIN: 10.8 g/dL — AB (ref 12.0–15.0)
MCH: 28.3 pg (ref 26.0–34.0)
MCHC: 32.5 g/dL (ref 30.0–36.0)
MCV: 86.9 fL (ref 78.0–100.0)
Platelets: 210 10*3/uL (ref 150–400)
RBC: 3.82 MIL/uL — ABNORMAL LOW (ref 3.87–5.11)
RDW: 14.9 % (ref 11.5–15.5)
WBC: 8.6 10*3/uL (ref 4.0–10.5)

## 2014-05-04 LAB — BASIC METABOLIC PANEL
ANION GAP: 14 (ref 5–15)
BUN: 13 mg/dL (ref 6–23)
CO2: 20 meq/L (ref 19–32)
CREATININE: 1.24 mg/dL — AB (ref 0.50–1.10)
Calcium: 7.8 mg/dL — ABNORMAL LOW (ref 8.4–10.5)
Chloride: 105 mEq/L (ref 96–112)
GFR calc non Af Amer: 51 mL/min — ABNORMAL LOW (ref >90)
GFR, EST AFRICAN AMERICAN: 59 mL/min — AB (ref >90)
Glucose, Bld: 89 mg/dL (ref 70–99)
Potassium: 3.6 mEq/L — ABNORMAL LOW (ref 3.7–5.3)
Sodium: 139 mEq/L (ref 137–147)

## 2014-05-04 LAB — URINE CULTURE

## 2014-05-04 LAB — GLUCOSE, CAPILLARY: Glucose-Capillary: 86 mg/dL (ref 70–99)

## 2014-05-04 LAB — MAGNESIUM: MAGNESIUM: 2 mg/dL (ref 1.5–2.5)

## 2014-05-04 LAB — PHOSPHORUS: Phosphorus: 2.7 mg/dL (ref 2.3–4.6)

## 2014-05-04 MED ORDER — PNEUMOCOCCAL VAC POLYVALENT 25 MCG/0.5ML IJ INJ
0.5000 mL | INJECTION | INTRAMUSCULAR | Status: DC
  Filled 2014-05-04: qty 0.5

## 2014-05-04 MED ORDER — POTASSIUM CHLORIDE 20 MEQ/15ML (10%) PO SOLN
20.0000 meq | ORAL | Status: AC
  Administered 2014-05-04 (×2): 20 meq
  Filled 2014-05-04 (×2): qty 15

## 2014-05-04 MED ORDER — INFLUENZA VAC SPLIT QUAD 0.5 ML IM SUSY
0.5000 mL | PREFILLED_SYRINGE | INTRAMUSCULAR | Status: DC
  Filled 2014-05-04: qty 0.5

## 2014-05-04 MED ORDER — HYDRALAZINE HCL 20 MG/ML IJ SOLN
10.0000 mg | Freq: Four times a day (QID) | INTRAMUSCULAR | Status: DC | PRN
  Administered 2014-05-04 – 2014-05-06 (×5): 10 mg via INTRAVENOUS
  Filled 2014-05-04 (×4): qty 1

## 2014-05-04 NOTE — Progress Notes (Signed)
[facility] ADULT ICU REPLACEMENT PROTOCOL FOR AM LAB REPLACEMENT ONLY  The patient does apply for the [facility] Adult ICU Electrolyte Replacment Protocol based on the criteria listed below:   1. Is GFR >/= 40 ml/min? Yes.    Patient's GFR today is 51 2. Is urine output >/= 0.5 ml/kg/hr for the last 6 hours? Yes.   Patient's UOP is 0.5 ml/kg/hr 3. Is BUN < 60 mg/dL? Yes.    Patient's BUN today is 13 4. Abnormal electrolyte(s): K3.6 5. Ordered repletion with: KCL/tube 6. If a panic level lab has been reported, has the CCM MD in charge been notified? Yes.  .   Physician:  Ann Lions, MD  [place] [facility] 05/04/2014 5:46 AM

## 2014-05-04 NOTE — Progress Notes (Signed)
05/04/2014- Respiratory care note-1240- Pt suctioned and extubated to 4lpm cannula as per MD order.  Pt sats 96% on 4lpm cannula, hr124, rr24.  Pt vocalizing post extubation.  Tolerated procedure well.  RN at bedside for procedure.  Will monitor progress and wean oxygen as tolerated.  Ambu-bag and ventilator on stand-by at bedside.  s Xara Paulding rrt. rcp
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
## 2014-05-04 NOTE — Progress Notes (Signed)
  Echocardiogram 2D Echocardiogram has been performed.  Diamond Nickel 05/04/2014, 12:28 PM

## 2014-05-05 LAB — BASIC METABOLIC PANEL
Anion gap: 16 — ABNORMAL HIGH (ref 5–15)
BUN: 13 mg/dL (ref 6–23)
CHLORIDE: 111 meq/L (ref 96–112)
CO2: 16 mEq/L — ABNORMAL LOW (ref 19–32)
CREATININE: 1.13 mg/dL — AB (ref 0.50–1.10)
Calcium: 7.8 mg/dL — ABNORMAL LOW (ref 8.4–10.5)
GFR calc non Af Amer: 57 mL/min — ABNORMAL LOW (ref >90)
GFR, EST AFRICAN AMERICAN: 66 mL/min — AB (ref >90)
GLUCOSE: 107 mg/dL — AB (ref 70–99)
Potassium: 3.7 mEq/L (ref 3.7–5.3)
Sodium: 143 mEq/L (ref 137–147)

## 2014-05-05 LAB — GLUCOSE, CAPILLARY
GLUCOSE-CAPILLARY: 119 mg/dL — AB (ref 70–99)
Glucose-Capillary: 94 mg/dL (ref 70–99)
Glucose-Capillary: 114 mg/dL — ABNORMAL HIGH (ref 70–99)

## 2014-05-05 LAB — CBC
HCT: 36.4 % (ref 36.0–46.0)
Hemoglobin: 11.7 g/dL — ABNORMAL LOW (ref 12.0–15.0)
MCH: 28.6 pg (ref 26.0–34.0)
MCHC: 32.1 g/dL (ref 30.0–36.0)
MCV: 89 fL (ref 78.0–100.0)
PLATELETS: 261 10*3/uL (ref 150–400)
RBC: 4.09 MIL/uL (ref 3.87–5.11)
RDW: 15.5 % (ref 11.5–15.5)
WBC: 10 10*3/uL (ref 4.0–10.5)

## 2014-05-05 LAB — CULTURE, RESPIRATORY

## 2014-05-05 LAB — CULTURE, RESPIRATORY W GRAM STAIN

## 2014-05-05 MED ORDER — AMLODIPINE BESYLATE 10 MG PO TABS
10.0000 mg | ORAL_TABLET | Freq: Every day | ORAL | Status: DC
  Administered 2014-05-05 – 2014-05-10 (×6): 10 mg via ORAL
  Filled 2014-05-05 (×6): qty 1

## 2014-05-05 MED ORDER — CETYLPYRIDINIUM CHLORIDE 0.05 % MT LIQD
7.0000 mL | Freq: Two times a day (BID) | OROMUCOSAL | Status: DC
  Administered 2014-05-05 – 2014-05-10 (×10): 7 mL via OROMUCOSAL

## 2014-05-05 NOTE — Progress Notes (Signed)
Subjective: Patient was extubated yesterday without complications. No recurrence of seizure activity reported. No overnight adverse clinical events otherwise reported.  Objective: Current vital signs: BP 153/70 mmHg  Pulse 125  Temp(Src) 98 F (36.7 C) (Oral)  Resp 30  Ht 4\' 8"  (1.422 m)  Wt 85.4 kg (188 lb 4.4 oz)  BMI 42.23 kg/m2  SpO2 96%  LMP  (LMP Unknown)  Neurologic Exam: Patient was alert and oriented to time as well as place. She still seems somewhat confused intended to calculate to some extent. Strength movements were full and conjugate. Visual fields were intact and normal. No facial asymmetry noted. Speech was normal. Strength and movement of upper extremities was normal and symmetrical.  Medications: I have reviewed the patient's current medications.  Assessment/Plan: 48 year old lady who was admitted with acute encephalopathy and seizure activity associated with hypertensive urgency. CT and MRI findings were indicative of PRES. Patient is currently on Keppra and Vimpat and has not had a recurrent seizure.  Recommending increasing Keppra to 1000 mg every 12 hours and discontinuing Vimpat. No other changes in management anticipated.  We will continue to follow this patient.  C.R. Nicole Kindred, MD [facility] [phone]  05/05/2014  8:51 AM

## 2014-05-05 NOTE — Progress Notes (Addendum)
PT Cancellation Note  Patient Details Name: [name] MRN: [id number] DOB: 1965/08/06   Cancelled Treatment:    Reason Eval/Treat Not Completed: Patient not medically ready RN requests PT evaluation be held at this time. Patient currently on strict bed rest orders. Will follow up at a later time. Attempt to page Fatima Blank, MD concerning activity orders however pager listed as inactive currently via [place].   [place], [place] Candie Mile S 05/05/2014, 10:30 AM
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
## 2014-05-06 MED ORDER — CLONIDINE HCL 0.1 MG PO TABS
0.1000 mg | ORAL_TABLET | Freq: Three times a day (TID) | ORAL | Status: DC
  Administered 2014-05-06 – 2014-05-07 (×3): 0.1 mg via ORAL
  Filled 2014-05-06 (×4): qty 1

## 2014-05-06 MED ORDER — FESOTERODINE FUMARATE ER 4 MG PO TB24: 4.0000 mg | ORAL_TABLET | Freq: Every day | ORAL | Status: DC

## 2014-05-06 MED ORDER — PANTOPRAZOLE SODIUM 40 MG PO TBEC
40.0000 mg | DELAYED_RELEASE_TABLET | Freq: Every day | ORAL | Status: DC
  Administered 2014-05-06 – 2014-05-10 (×5): 40 mg via ORAL
  Filled 2014-05-06 (×5): qty 1

## 2014-05-06 MED ORDER — BACLOFEN 10 MG PO TABS
40.0000 mg | ORAL_TABLET | Freq: Four times a day (QID) | ORAL | Status: DC
  Administered 2014-05-06 – 2014-05-10 (×15): 40 mg via ORAL
  Filled 2014-05-06 (×3): qty 4
  Filled 2014-05-06 (×2): qty 2
  Filled 2014-05-06 (×11): qty 4

## 2014-05-06 MED ORDER — DOCUSATE SODIUM 100 MG PO CAPS
100.0000 mg | ORAL_CAPSULE | Freq: Every day | ORAL | Status: DC
  Administered 2014-05-07 – 2014-05-09 (×4): 100 mg via ORAL
  Filled 2014-05-06 (×6): qty 1

## 2014-05-06 MED ORDER — FESOTERODINE FUMARATE ER 8 MG PO TB24
8.0000 mg | ORAL_TABLET | Freq: Every day | ORAL | Status: DC
  Administered 2014-05-06 – 2014-05-10 (×5): 8 mg via ORAL
  Filled 2014-05-06 (×5): qty 1

## 2014-05-06 MED ORDER — LEVETIRACETAM IN NACL 1000 MG/100ML IV SOLN
1000.0000 mg | Freq: Two times a day (BID) | INTRAVENOUS | Status: DC
  Administered 2014-05-06 – 2014-05-07 (×2): 1000 mg via INTRAVENOUS
  Filled 2014-05-06 (×4): qty 100

## 2014-05-06 NOTE — Progress Notes (Signed)
Utilization review completed.  
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
## 2014-05-06 NOTE — Progress Notes (Signed)
Subjective: No recurrence of seizure activity reported. Overnight adverse clinical events reported otherwise.  Objective: Current vital signs: BP 146/85 mmHg  Pulse 115  Temp(Src) 97.6 F (36.4 C) (Oral)  Resp 32  Ht 4\' 8"  (1.422 m)  Wt 87.4 kg (192 lb 10.9 oz)  BMI 43.22 kg/m2  SpO2 95%  LMP  (LMP Unknown)  Neurologic Exam: Patient was alert and in no acute distress. She was well-oriented to time as well as place. Showed no signs of confusion. She is no longer confabulating. Extraocular movements and visual fields were normal. No facial weakness was noted. Speech was normal with no dysarthria. Strength of upper extremities was normal proximally and distally.  Medications: I have reviewed the patient's current medications.  Assessment/Plan: 48 year old lady admitted with hypertensive urgency along with new onset seizure activity and abnormal CT and MRI of the brain consistent with PRES. No further seizure activity has occurred. Mental status has returned to normal.  I will increase Keppra and discontinue Vimpat at this point. She will need outpatient neurology follow-up with a a repeat MRI of the brain in the next 1-2 weeks to assess evolution of changes associated with what is most likely PRES.  I will plan to see her in follow-up on an as-needed basis during this admission.  C.R. Nicole Kindred, MD [facility] [phone]  05/06/2014  8:51 AM
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
## 2014-05-07 LAB — BASIC METABOLIC PANEL
ANION GAP: 16 — AB (ref 5–15)
BUN: 11 mg/dL (ref 6–23)
CALCIUM: 7.5 mg/dL — AB (ref 8.4–10.5)
CO2: 13 meq/L — AB (ref 19–32)
CREATININE: 1.2 mg/dL — AB (ref 0.50–1.10)
Chloride: 109 mEq/L (ref 96–112)
GFR calc Af Amer: 62 mL/min — ABNORMAL LOW (ref >90)
GFR calc non Af Amer: 53 mL/min — ABNORMAL LOW (ref >90)
GLUCOSE: 89 mg/dL (ref 70–99)
Potassium: 3.6 mEq/L — ABNORMAL LOW (ref 3.7–5.3)
Sodium: 138 mEq/L (ref 137–147)

## 2014-05-07 MED ORDER — LORAZEPAM 2 MG/ML IJ SOLN: 0.5000 mg | Freq: Once | INTRAMUSCULAR | Status: DC

## 2014-05-07 MED ORDER — FUROSEMIDE 10 MG/ML IJ SOLN
40.0000 mg | Freq: Every day | INTRAMUSCULAR | Status: DC
  Administered 2014-05-07 – 2014-05-10 (×4): 40 mg via INTRAVENOUS
  Filled 2014-05-07 (×4): qty 4

## 2014-05-07 MED ORDER — CLONIDINE HCL 0.1 MG PO TABS
0.2000 mg | ORAL_TABLET | Freq: Three times a day (TID) | ORAL | Status: DC
  Administered 2014-05-07 – 2014-05-10 (×10): 0.2 mg via ORAL
  Filled 2014-05-07 (×10): qty 2

## 2014-05-07 MED ORDER — LEVETIRACETAM 500 MG PO TABS
1000.0000 mg | ORAL_TABLET | Freq: Two times a day (BID) | ORAL | Status: DC
  Administered 2014-05-07 – 2014-05-10 (×6): 1000 mg via ORAL
  Filled 2014-05-07 (×7): qty 2

## 2014-05-07 NOTE — Progress Notes (Signed)
[facility] PROGRESS NOTE  Charniece Kwiat [id number] DOB: 1966/03/14 DOA: 05/02/2014 PCP: [name], MD  Assessment/Plan: 1. Status epilepticus 1. On Keppra, d/c'd Vimpat 2. Stable thus far 2. Questionable CVA on CT 1. Findings on MRI likely related to PRES 2. Seen by Neurology with recs for outpatient repeat MRI in 1-2 weeks to assess evolution of changes 3. Malignant htn 1. BP stable, albeit suboptimal 2. Cont to titrate bp meds as tolerated 4. Mild acute renal insufficiency 1. Renal function remaining stable 2. Avoid nephrotoxic agents 5. UTI 1. Completed course of rocephin 6. Questionable recent aspiration 7. DVT prophylaxis 1. Heparin subQ  Code Status: Full Family Communication: Pt in room  Disposition Plan: Pending   Consultants:  Neurology  Procedures: 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 OETT 12/7 > 12/9 TTE 12/9 - EF 65-70% - no WMA - grade 1 DD  Antibiotics:  Rocephin 12/7>>>12/11  HPI/Subjective: No acute events noted overnight  Objective: Filed Vitals:   05/06/14 2115 05/07/14 0224 05/07/14 0507 05/07/14 0915  BP: 147/84 167/93 162/92 164/89  Pulse: 111 108 108 106  Temp: 97.6 F (36.4 C) 97.8 F (36.6 C) 98.6 F (37 C) 98.9 F (37.2 C)  TempSrc: Oral Oral Axillary Oral  Resp: 18 18 20 21   Height:      Weight:   87.726 kg (193 lb 6.4 oz)   SpO2: 98%  99% 99%    Intake/Output Summary (Last 24 hours) at 05/07/14 1453 Last data filed at 05/07/14 0504  Gross per 24 hour  Intake      0 ml  Output    730 ml  Net   -730 ml   Filed Weights   05/05/14 0500 05/06/14 0300 05/07/14 0507  Weight: 85.4 kg (188 lb 4.4  oz) 87.4 kg (192 lb 10.9 oz) 87.726 kg (193 lb 6.4 oz)    Exam:   General:  Awake, in nad  Cardiovascular: regular, s1, s2  Respiratory: normal resp effort, no wheezing  Abdomen: soft,nondistended  Musculoskeletal: perfused, no clubbing   Data Reviewed: Basic Metabolic Panel:  Recent Labs Lab 05/02/14 1817 05/03/14 0348 05/04/14 0250 05/05/14 0222 05/07/14 0540  NA 139  --  139 143 138  K 3.9  --  3.6* 3.7 3.6*  CL 102  --  105 111 109  CO2 25  --  20 16* 13*  GLUCOSE 143*  --  89 107* 89  BUN 12  --  13 13 11   CREATININE 1.06 1.15* 1.24* 1.13* 1.20*  CALCIUM 8.7  --  7.8* 7.8* 7.5*  MG  --   --  2.0  --   --   PHOS  --   --  2.7  --   --    Liver Function Tests:  Recent Labs Lab 05/02/14 1817  AST 26  ALT 16  ALKPHOS 102  BILITOT 0.3  PROT 6.0  ALBUMIN 1.9*   No results for input(s): LIPASE, AMYLASE in the last 168 hours. No results for input(s): AMMONIA in the last 168 hours. CBC:  Recent Labs Lab 05/02/14 1817 05/03/14 0348 05/04/14 0250 05/05/14 0222  WBC 10.8* 9.0 8.6 10.0  NEUTROABS 9.2*  --   --   --   HGB 14.0 12.8 10.8*  11.7*  HCT 42.4 37.9 33.2* 36.4  MCV 87.8 88.1 86.9 89.0  PLT 228 223 210 261   Cardiac Enzymes:  Recent Labs Lab 05/02/14 1817  TROPONINI <0.30   BNP (last 3 results)  Recent Labs  05/21/13 1145  PROBNP 1928.0*   CBG:  Recent Labs Lab 05/03/14 1900 05/04/14 0029 05/05/14 1512 05/05/14 1905 05/05/14 2339  GLUCAP 84 86 94 114* 119*    Recent Results (from the past 240 hour(s))  MRSA PCR Screening     Status: None   Collection Time: 05/02/14 11:57 PM  Result Value Ref Range Status   MRSA by PCR NEGATIVE NEGATIVE Final    Comment:        The GeneXpert MRSA Assay (FDA approved for NASAL specimens only), is one component of a comprehensive MRSA colonization surveillance program. It is not intended to diagnose MRSA infection nor to guide or monitor treatment for MRSA infections.   Culture,  blood (routine x 2)     Status: None (Preliminary result)   Collection Time: 05/03/14  2:42 AM  Result Value Ref Range Status   Specimen Description BLOOD LEFT HAND  Final   Special Requests BOTTLES DRAWN AEROBIC ONLY 1CC  Final   Culture  Setup Time   Final    05/03/2014 10:50 Performed at [facility]    Culture   Final           BLOOD CULTURE RECEIVED NO GROWTH TO DATE CULTURE WILL BE HELD FOR 5 DAYS BEFORE ISSUING A FINAL NEGATIVE REPORT Note: Culture results may be compromised due to an inadequate volume of blood received in culture bottles. Performed at [facility]    Report Status PENDING  Incomplete  Culture, blood (routine x 2)     Status: None (Preliminary result)   Collection Time: 05/03/14  2:52 AM  Result Value Ref Range Status   Specimen Description BLOOD LEFT ARM  Final   Special Requests BOTTLES DRAWN AEROBIC ONLY 1CC  Final   Culture  Setup Time   Final    05/03/2014 10:48 Performed at [facility]    Culture   Final           BLOOD CULTURE RECEIVED NO GROWTH TO DATE CULTURE WILL BE HELD FOR 5 DAYS BEFORE ISSUING A FINAL NEGATIVE REPORT Note: Culture results may be compromised due to an inadequate volume of blood received in culture bottles. Performed at [facility]    Report Status PENDING  Incomplete  Urine culture     Status: None   Collection Time: 05/03/14  5:34 AM  Result Value Ref Range Status   Specimen Description URINE, SUPRAPUBIC  Final   Special Requests NONE  Final   Culture  Setup Time   Final    05/03/2014 10:58 Performed at [place]   Final    >=100,000 COLONIES/ML Performed at [facility]    Culture   Final    Multiple bacterial morphotypes present, none predominant. Suggest appropriate recollection if clinically indicated. Performed at [facility]    Report Status 05/04/2014 FINAL  Final  Culture, respiratory (NON-Expectorated)     Status: None    Collection Time: 05/03/14  5:34 AM  Result Value Ref Range Status   Specimen Description TRACHEAL ASPIRATE  Final   Special Requests NONE  Final   Gram Stain   Final    FEW WBC PRESENT,BOTH PMN AND MONONUCLEAR RARE SQUAMOUS EPITHELIAL  CELLS PRESENT FEW GRAM POSITIVE COCCI IN PAIRS IN CLUSTERS Performed at [facility]    Culture   Final    Non-Pathogenic Oropharyngeal-type Flora Isolated. Performed at [facility]    Report Status 05/05/2014 FINAL  Final     Studies: No results found.  Scheduled Meds: . amLODipine  10 mg Oral Daily  . antiseptic oral rinse  7 mL Mouth Rinse BID  . baclofen  40 mg Oral QID  . cloNIDine  0.1 mg Oral TID  . docusate sodium  100 mg Oral QHS  . fesoterodine  8 mg Oral Daily  . furosemide  40 mg Intravenous Daily  . heparin  5,000 Units Subcutaneous 3 times per day  . levETIRAcetam  1,000 mg Oral BID  . LORazepam  0.5 mg Intravenous Once  . pantoprazole  40 mg Oral Q1200   Continuous Infusions: . sodium chloride 10 mL/hr at 05/06/14 1412    Principal Problem:   Status epilepticus Active Problems:   Paraplegia   Suprapubic catheter   Respiratory failure, acute   Encephalopathy acute   Hypertensive emergency without congestive heart failure   UTI (lower urinary tract infection)   Seizure  Time spent: 71min  [name], [facility] Pager [phone]. If 7PM-7AM, please contact night-coverage at [email or web address], password [facility] 05/07/2014, 2:53 PM  LOS: 5 days

## 2014-05-07 NOTE — Progress Notes (Signed)
Pt has pitting edema to upper and lower extremeties. Nurse notified MD, Dr. Wyline Copas per text message. Sitter at bedside. Pt droswy and able to follow some commands. Will continue to monitor   [name] I 05/07/2014 10:14 AM

## 2014-05-08 LAB — BASIC METABOLIC PANEL
Anion gap: 13 (ref 5–15)
BUN: 9 mg/dL (ref 6–23)
CO2: 18 mEq/L — ABNORMAL LOW (ref 19–32)
Calcium: 7.4 mg/dL — ABNORMAL LOW (ref 8.4–10.5)
Chloride: 107 mEq/L (ref 96–112)
Creatinine, Ser: 1.3 mg/dL — ABNORMAL HIGH (ref 0.50–1.10)
GFR calc Af Amer: 56 mL/min — ABNORMAL LOW (ref >90)
GFR, EST NON AFRICAN AMERICAN: 48 mL/min — AB (ref >90)
GLUCOSE: 86 mg/dL (ref 70–99)
POTASSIUM: 2.7 meq/L — AB (ref 3.7–5.3)
SODIUM: 138 meq/L (ref 137–147)

## 2014-05-08 LAB — POCT I-STAT 3, ART BLOOD GAS (G3+)
Acid-base deficit: 9 mmol/L — ABNORMAL HIGH (ref 0.0–2.0)
Bicarbonate: 17.1 mEq/L — ABNORMAL LOW (ref 20.0–24.0)
O2 Saturation: 95 %
PCO2 ART: 34.5 mmHg — AB (ref 35.0–45.0)
PH ART: 7.3 — AB (ref 7.350–7.450)
Patient temperature: 97.2
TCO2: 18 mmol/L (ref 0–100)
pO2, Arterial: 81 mmHg (ref 80.0–100.0)

## 2014-05-08 LAB — CBC
HCT: 30.8 % — ABNORMAL LOW (ref 36.0–46.0)
HEMOGLOBIN: 10.2 g/dL — AB (ref 12.0–15.0)
MCH: 28.6 pg (ref 26.0–34.0)
MCHC: 33.1 g/dL (ref 30.0–36.0)
MCV: 86.3 fL (ref 78.0–100.0)
PLATELETS: 199 10*3/uL (ref 150–400)
RBC: 3.57 MIL/uL — AB (ref 3.87–5.11)
RDW: 14.6 % (ref 11.5–15.5)
WBC: 5.1 10*3/uL (ref 4.0–10.5)

## 2014-05-08 MED ORDER — POTASSIUM CHLORIDE CRYS ER 20 MEQ PO TBCR
40.0000 meq | EXTENDED_RELEASE_TABLET | Freq: Two times a day (BID) | ORAL | Status: AC
  Administered 2014-05-08 (×2): 40 meq via ORAL
  Filled 2014-05-08 (×2): qty 2

## 2014-05-08 NOTE — Progress Notes (Signed)
[facility] PROGRESS NOTE  [name] [id number] DOB: 1965/07/07 DOA: 05/02/2014 PCP: [name], MD  Assessment/Plan: 1. Status epilepticus 1. Pt is cont on Keppra, had d/c'd Vimpat 2. Remains stable thus far 2. Questionable CVA on CT 1. Per Neurology, findings on MRI likely related to PRES 2. Pt was seen by Neurology with recs for outpatient repeat MRI in 1-2 weeks to assess evolution of changes 3. Malignant htn 1. BP stable, now much improved 2. Cont to titrate bp meds as tolerated 4. Mild acute renal insufficiency 1. Cr trending up slowly 2. Avoid nephrotoxic agents 3. Increase IVF to 75cc/hr 5. UTI 1. Completed course of rocephin 6. Questionable recent aspiration 7. DVT prophylaxis 1. 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 No acute events overnight. Pt reports being allergic to [place].  Objective: Filed Vitals:   05/07/14 1810 05/07/14 2042 05/08/14 0335 05/08/14 0512  BP: 92/48 106/66 118/62 123/66  Pulse: 76 67 74 74  Temp: 98.7 F (37.1 C) 98.8 F (37.1 C) 97.9 F (36.6 C) 97.5 F (36.4 C)  TempSrc: Oral Oral Oral Oral  Resp: 18 20 19 20   Height:      Weight:    88.587 kg (195 lb 4.8 oz)  SpO2: 99% 97% 98% 98%    Intake/Output Summary (Last 24 hours) at 05/08/14 0850 Last data filed at 05/08/14 0513  Gross per 24 hour  Intake      0 ml   Output   4000 ml  Net  -4000 ml   Filed Weights   05/06/14 0300 05/07/14 0507 05/08/14 0512  Weight: 87.4 kg (192 lb 10.9 oz) 87.726 kg (193 lb 6.4 oz) 88.587 kg (195 lb 4.8 oz)    Exam:   General:  Awake, in nad  Cardiovascular: regular, s1, s2  Respiratory: normal resp effort, no wheezing  Abdomen: soft,nondistended  Musculoskeletal: perfused, no clubbing   Data Reviewed: Basic Metabolic Panel:  Recent Labs Lab 05/02/14 1817 05/03/14 0348 05/04/14 0250 05/05/14 0222 05/07/14 0540 05/08/14 0547  NA 139  --  139 143 138 138  K 3.9  --  3.6* 3.7 3.6* 2.7*  CL 102  --  105 111 109 107  CO2 25  --  20 16* 13* 18*  GLUCOSE 143*  --  89 107* 89 86  BUN 12  --  13 13 11 9   CREATININE 1.06 1.15* 1.24* 1.13* 1.20* 1.30*  CALCIUM 8.7  --  7.8* 7.8* 7.5* 7.4*  MG  --   --  2.0  --   --   --   PHOS  --   --  2.7  --   --   --    Liver Function Tests:  Recent Labs Lab 05/02/14 1817  AST 26  ALT 16  ALKPHOS 102  BILITOT 0.3  PROT 6.0  ALBUMIN 1.9*   No results for input(s): LIPASE, AMYLASE in the last 168 hours. No results for input(s): AMMONIA in the  last 168 hours. CBC:  Recent Labs Lab 05/02/14 1817 05/03/14 0348 05/04/14 0250 05/05/14 0222 05/08/14 0547  WBC 10.8* 9.0 8.6 10.0 5.1  NEUTROABS 9.2*  --   --   --   --   HGB 14.0 12.8 10.8* 11.7* 10.2*  HCT 42.4 37.9 33.2* 36.4 30.8*  MCV 87.8 88.1 86.9 89.0 86.3  PLT 228 223 210 261 199   Cardiac Enzymes:  Recent Labs Lab 05/02/14 1817  TROPONINI <0.30   BNP (last 3 results)  Recent Labs  05/21/13 1145  PROBNP 1928.0*   CBG:  Recent Labs Lab 05/03/14 1900 05/04/14 0029 05/05/14 1512 05/05/14 1905 05/05/14 2339  GLUCAP 84 86 94 114* 119*    Recent Results (from the past 240 hour(s))  MRSA PCR Screening     Status: None   Collection Time: 05/02/14 11:57 PM  Result Value Ref Range Status   MRSA by PCR NEGATIVE NEGATIVE Final    Comment:        The GeneXpert MRSA Assay  (FDA approved for NASAL specimens only), is one component of a comprehensive MRSA colonization surveillance program. It is not intended to diagnose MRSA infection nor to guide or monitor treatment for MRSA infections.   Culture, blood (routine x 2)     Status: None (Preliminary result)   Collection Time: 05/03/14  2:42 AM  Result Value Ref Range Status   Specimen Description BLOOD LEFT HAND  Final   Special Requests BOTTLES DRAWN AEROBIC ONLY 1CC  Final   Culture  Setup Time   Final    05/03/2014 10:50 Performed at [facility]    Culture   Final           BLOOD CULTURE RECEIVED NO GROWTH TO DATE CULTURE WILL BE HELD FOR 5 DAYS BEFORE ISSUING A FINAL NEGATIVE REPORT Note: Culture results may be compromised due to an inadequate volume of blood received in culture bottles. Performed at [facility]    Report Status PENDING  Incomplete  Culture, blood (routine x 2)     Status: None (Preliminary result)   Collection Time: 05/03/14  2:52 AM  Result Value Ref Range Status   Specimen Description BLOOD LEFT ARM  Final   Special Requests BOTTLES DRAWN AEROBIC ONLY 1CC  Final   Culture  Setup Time   Final    05/03/2014 10:48 Performed at [facility]    Culture   Final           BLOOD CULTURE RECEIVED NO GROWTH TO DATE CULTURE WILL BE HELD FOR 5 DAYS BEFORE ISSUING A FINAL NEGATIVE REPORT Note: Culture results may be compromised due to an inadequate volume of blood received in culture bottles. Performed at [facility]    Report Status PENDING  Incomplete  Urine culture     Status: None   Collection Time: 05/03/14  5:34 AM  Result Value Ref Range Status   Specimen Description URINE, SUPRAPUBIC  Final   Special Requests NONE  Final   Culture  Setup Time   Final    05/03/2014 10:58 Performed at [place]   Final    >=100,000 COLONIES/ML Performed at [facility]    Culture   Final    Multiple bacterial  morphotypes present, none predominant. Suggest appropriate recollection if clinically indicated. Performed at [facility]    Report Status 05/04/2014 FINAL  Final  Culture, respiratory (NON-Expectorated)     Status:  None   Collection Time: 05/03/14  5:34 AM  Result Value Ref Range Status   Specimen Description TRACHEAL ASPIRATE  Final   Special Requests NONE  Final   Gram Stain   Final    FEW WBC PRESENT,BOTH PMN AND MONONUCLEAR RARE SQUAMOUS EPITHELIAL CELLS PRESENT FEW GRAM POSITIVE COCCI IN PAIRS IN CLUSTERS Performed at [facility]    Culture   Final    Non-Pathogenic Oropharyngeal-type Flora Isolated. Performed at [facility]    Report Status 05/05/2014 FINAL  Final     Studies: No results found.  Scheduled Meds: . amLODipine  10 mg Oral Daily  . antiseptic oral rinse  7 mL Mouth Rinse BID  . baclofen  40 mg Oral QID  . cloNIDine  0.2 mg Oral TID  . docusate sodium  100 mg Oral QHS  . fesoterodine  8 mg Oral Daily  . furosemide  40 mg Intravenous Daily  . heparin  5,000 Units Subcutaneous 3 times per day  . levETIRAcetam  1,000 mg Oral BID  . LORazepam  0.5 mg Intravenous Once  . pantoprazole  40 mg Oral Q1200  . potassium chloride  40 mEq Oral BID   Continuous Infusions: . sodium chloride 10 mL/hr at 05/06/14 1412    Principal Problem:   Status epilepticus Active Problems:   Paraplegia   Suprapubic catheter   Respiratory failure, acute   Encephalopathy acute   Hypertensive emergency without congestive heart failure   UTI (lower urinary tract infection)   Seizure  Time spent: 76min  [facility], [facility] Pager [phone]. If 7PM-7AM, please contact night-coverage at [email or web address], password [facility] 05/08/2014, 8:50 AM  LOS: 6 days

## 2014-05-08 NOTE — Progress Notes (Signed)
Critical lab received, potassium 2.7, at 7:11 am. MD paged 7:11 am Awaiting new orders.  Will continue to monitor. Cori Razor, RN

## 2014-05-09 DIAGNOSIS — I6783 Posterior reversible encephalopathy syndrome: Secondary | ICD-10-CM

## 2014-05-09 LAB — CULTURE, BLOOD (ROUTINE X 2)
CULTURE: NO GROWTH
Culture: NO GROWTH

## 2014-05-09 MED ORDER — ENSURE PUDDING PO PUDG: 1.0000 | ORAL | Status: DC

## 2014-05-09 MED ORDER — ENSURE COMPLETE PO LIQD
237.0000 mL | ORAL | Status: DC
  Administered 2014-05-10: 237 mL via ORAL

## 2014-05-09 MED ORDER — ADULT MULTIVITAMIN W/MINERALS CH
1.0000 | ORAL_TABLET | Freq: Every day | ORAL | Status: DC
  Administered 2014-05-09 – 2014-05-10 (×2): 1 via ORAL
  Filled 2014-05-09 (×2): qty 1

## 2014-05-09 MED ORDER — METOPROLOL TARTRATE 25 MG PO TABS
25.0000 mg | ORAL_TABLET | Freq: Two times a day (BID) | ORAL | Status: DC
  Administered 2014-05-09 – 2014-05-10 (×3): 25 mg via ORAL
  Filled 2014-05-09 (×3): qty 1
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
## 2014-05-09 NOTE — Progress Notes (Signed)
UR COMPLETED  

## 2014-05-09 NOTE — Progress Notes (Signed)
Rehab admissions - I met with pt and her son in follow up to rehab MD consult to explain the possibility of inpatient rehab. Pt has been noted to have confusion but answered my questions appropriately. Son also shared information. Further questions were answered about our rehab program and informational brochures were given. Son and pt shared that pt uses a hoyer lift for all transfers to her manual wheelchair and that she has ample support at home with her aide M-F (8 am to 4 pm) and Sat (8-1 pm). Pt shared that she can mostly dress herself in the mornings and that the aide helps with some of her lower body dressing at baseline. I have reviewed latest PT note and noted her decreased sitting/activity tolerance.   Pt and son both stated that their first preference is for pt to return home. Son stated, "I think my mom's thinking will improve when she is in her own surroundings." I explained the estimated length of stay per rehab MD (15-20 days) and pt had a strong response that she wanted to go home or maybe stay for 2 days. I explained that follow up therapy options (home health or outpt) would likely be significantly limited due to her Medicaid.  Pt/son still were preferring to go home as they have established home health aide support for pt six days a week and son is primary caregiver around his work schedule. I discussed pt's case with Hassan Rowan, case Freight forwarder.  I will follow up with pt/son tomorrow to see if they are still preferring to pursue home with home support.  Please call me with any questions. Thanks.  Nanetta Batty, PT Rehabilitation Admissions Coordinator [phone]
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
## 2014-05-09 NOTE — Progress Notes (Signed)
[facility] PROGRESS NOTE  [name] [id number] DOB: 03-21-1966 DOA: 05/02/2014 PCP: [name], MD  Assessment/Plan: 1. Status epilepticus 1. Pt is cont on Keppra, had d/c'd Vimpat 2. Remains stable thus far 2. Questionable CVA on CT 1. Per Neurology, findings on MRI likely related to PRES 2. Pt was seen by Neurology with recs for outpatient repeat MRI in 1-2 weeks to assess evolution of changes 3. Pt continues with hallucinations overnight 4. Will add metoprolol to current regimen 3. Malignant htn 1. BP stable, sub-optimal this AM 2. Cont to titrate bp meds as tolerated 3. Will add metoprolol to regimen 4. Mild acute renal insufficiency 1. Cr had been trending up slowly, given IVF overnight 2. Avoid nephrotoxic agents if possible 5. UTI 1. Completed course of rocephin 6. Questionable recent aspiration 7. DVT prophylaxis 1. 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 No acute events noted overnight  Objective: Filed Vitals:   05/08/14 2146 05/09/14 0100 05/09/14 0610 05/09/14 1043  BP: 161/82 135/72 150/78 168/85  Pulse: 100 106 98 95  Temp: 97.9 F (36.6 C) 97.7 F (36.5 C) 97.8 F (36.6 C) 98.1 F (36.7 C)  TempSrc: Oral Oral Oral Oral  Resp: 18 18 18 17   Height:      Weight:   84.732 kg (186 lb 12.8 oz)   SpO2: 100% 100% 98% 99%     Intake/Output Summary (Last 24 hours) at 05/09/14 1259 Last data filed at 05/09/14 0600  Gross per 24 hour  Intake      0 ml  Output   1550 ml  Net  -1550 ml   Filed Weights   05/07/14 0507 05/08/14 0512 05/09/14 0610  Weight: 87.726 kg (193 lb 6.4 oz) 88.587 kg (195 lb 4.8 oz) 84.732 kg (186 lb 12.8 oz)    Exam:   General:  Awake, in nad  Cardiovascular: regular, s1, s2  Respiratory: normal resp effort, no wheezing  Abdomen: soft,nondistended  Musculoskeletal: perfused, no clubbing   Data Reviewed: Basic Metabolic Panel:  Recent Labs Lab 05/02/14 1817 05/03/14 0348 05/04/14 0250 05/05/14 0222 05/07/14 0540 05/08/14 0547  NA 139  --  139 143 138 138  K 3.9  --  3.6* 3.7 3.6* 2.7*  CL 102  --  105 111 109 107  CO2 25  --  20 16* 13* 18*  GLUCOSE 143*  --  89 107* 89 86  BUN 12  --  13 13 11 9   CREATININE 1.06 1.15* 1.24* 1.13* 1.20* 1.30*  CALCIUM 8.7  --  7.8* 7.8* 7.5* 7.4*  MG  --   --  2.0  --   --   --   PHOS  --   --  2.7  --   --   --    Liver Function Tests:  Recent Labs Lab 05/02/14 1817  AST 26  ALT 16  ALKPHOS 102  BILITOT 0.3  PROT 6.0  ALBUMIN 1.9*  No results for input(s): LIPASE, AMYLASE in the last 168 hours. No results for input(s): AMMONIA in the last 168 hours. CBC:  Recent Labs Lab 05/02/14 1817 05/03/14 0348 05/04/14 0250 05/05/14 0222 05/08/14 0547  WBC 10.8* 9.0 8.6 10.0 5.1  NEUTROABS 9.2*  --   --   --   --   HGB 14.0 12.8 10.8* 11.7* 10.2*  HCT 42.4 37.9 33.2* 36.4 30.8*  MCV 87.8 88.1 86.9 89.0 86.3  PLT 228 223 210 261 199   Cardiac Enzymes:  Recent Labs Lab 05/02/14 1817  TROPONINI <0.30   BNP (last 3 results)  Recent Labs  05/21/13 1145  PROBNP 1928.0*   CBG:  Recent Labs Lab 05/03/14 1900 05/04/14 0029 05/05/14 1512 05/05/14 1905 05/05/14 2339  GLUCAP 84 86 94 114* 119*    Recent Results (from the past 240 hour(s))  MRSA PCR Screening     Status: None   Collection Time:  05/02/14 11:57 PM  Result Value Ref Range Status   MRSA by PCR NEGATIVE NEGATIVE Final    Comment:        The GeneXpert MRSA Assay (FDA approved for NASAL specimens only), is one component of a comprehensive MRSA colonization surveillance program. It is not intended to diagnose MRSA infection nor to guide or monitor treatment for MRSA infections.   Culture, blood (routine x 2)     Status: None   Collection Time: 05/03/14  2:42 AM  Result Value Ref Range Status   Specimen Description BLOOD LEFT HAND  Final   Special Requests BOTTLES DRAWN AEROBIC ONLY 1CC  Final   Culture  Setup Time   Final    05/03/2014 10:50 Performed at [facility]    Culture   Final    NO GROWTH 5 DAYS Note: Culture results may be compromised due to an inadequate volume of blood received in culture bottles. Performed at [facility]    Report Status 05/09/2014 FINAL  Final  Culture, blood (routine x 2)     Status: None   Collection Time: 05/03/14  2:52 AM  Result Value Ref Range Status   Specimen Description BLOOD LEFT ARM  Final   Special Requests BOTTLES DRAWN AEROBIC ONLY 1CC  Final   Culture  Setup Time   Final    05/03/2014 10:48 Performed at [facility]    Culture   Final    NO GROWTH 5 DAYS Note: Culture results may be compromised due to an inadequate volume of blood received in culture bottles. Performed at [facility]    Report Status 05/09/2014 FINAL  Final  Urine culture     Status: None   Collection Time: 05/03/14  5:34 AM  Result Value Ref Range Status   Specimen Description URINE, SUPRAPUBIC  Final   Special Requests NONE  Final   Culture  Setup Time   Final    05/03/2014 10:58 Performed at [place]   Final    >=100,000 COLONIES/ML Performed at [facility]    Culture   Final    Multiple bacterial morphotypes present, none predominant. Suggest appropriate recollection if clinically indicated. Performed  at [facility]    Report Status 05/04/2014 FINAL  Final  Culture, respiratory (NON-Expectorated)     Status: None   Collection Time: 05/03/14  5:34 AM  Result Value Ref Range Status   Specimen Description TRACHEAL ASPIRATE  Final   Special Requests NONE  Final  Gram Stain   Final    FEW WBC PRESENT,BOTH PMN AND MONONUCLEAR RARE SQUAMOUS EPITHELIAL CELLS PRESENT FEW GRAM POSITIVE COCCI IN PAIRS IN CLUSTERS Performed at [facility]    Culture   Final    Non-Pathogenic Oropharyngeal-type Flora Isolated. Performed at [facility]    Report Status 05/05/2014 FINAL  Final     Studies: No results found.  Scheduled Meds: . amLODipine  10 mg Oral Daily  . antiseptic oral rinse  7 mL Mouth Rinse BID  . baclofen  40 mg Oral QID  . cloNIDine  0.2 mg Oral TID  . docusate sodium  100 mg Oral QHS  . fesoterodine  8 mg Oral Daily  . furosemide  40 mg Intravenous Daily  . heparin  5,000 Units Subcutaneous 3 times per day  . levETIRAcetam  1,000 mg Oral BID  . LORazepam  0.5 mg Intravenous Once  . metoprolol tartrate  25 mg Oral BID  . pantoprazole  40 mg Oral Q1200   Continuous Infusions:    Principal Problem:   Status epilepticus Active Problems:   Paraplegia   Suprapubic catheter   Respiratory failure, acute   Encephalopathy acute   Hypertensive emergency without congestive heart failure   UTI (lower urinary tract infection)   Seizure  Time spent: 60min  Madisin Hasan, [facility] [facility] Pager [phone]. If 7PM-7AM, please contact night-coverage at [email or web address], password [facility] 05/09/2014, 12:59 PM  LOS: 7 days

## 2014-05-10 LAB — BASIC METABOLIC PANEL
ANION GAP: 10 (ref 5–15)
BUN: 7 mg/dL (ref 6–23)
CO2: 24 mEq/L (ref 19–32)
Calcium: 7.9 mg/dL — ABNORMAL LOW (ref 8.4–10.5)
Chloride: 107 mEq/L (ref 96–112)
Creatinine, Ser: 1.09 mg/dL (ref 0.50–1.10)
GFR calc Af Amer: 69 mL/min — ABNORMAL LOW (ref >90)
GFR, EST NON AFRICAN AMERICAN: 60 mL/min — AB (ref >90)
Glucose, Bld: 100 mg/dL — ABNORMAL HIGH (ref 70–99)
POTASSIUM: 3.4 meq/L — AB (ref 3.7–5.3)
SODIUM: 141 meq/L (ref 137–147)

## 2014-05-10 MED ORDER — PANTOPRAZOLE SODIUM 40 MG PO TBEC: 40.0000 mg | DELAYED_RELEASE_TABLET | Freq: Every day | ORAL | Status: DC

## 2014-05-10 MED ORDER — AMLODIPINE BESYLATE 10 MG PO TABS: 10.0000 mg | ORAL_TABLET | Freq: Every day | ORAL | Status: DC

## 2014-05-10 MED ORDER — CLONIDINE HCL 0.2 MG PO TABS: 0.2000 mg | ORAL_TABLET | Freq: Three times a day (TID) | ORAL | Status: DC

## 2014-05-10 MED ORDER — POTASSIUM CHLORIDE CRYS ER 20 MEQ PO TBCR
40.0000 meq | EXTENDED_RELEASE_TABLET | Freq: Once | ORAL | Status: AC
  Administered 2014-05-10: 40 meq via ORAL
  Filled 2014-05-10: qty 2

## 2014-05-10 MED ORDER — LEVETIRACETAM 1000 MG PO TABS
1000.0000 mg | ORAL_TABLET | Freq: Two times a day (BID) | ORAL | Status: DC
Start: 2014-05-10 — End: 2014-10-19

## 2014-05-10 MED ORDER — METOPROLOL TARTRATE 25 MG PO TABS: 25.0000 mg | ORAL_TABLET | Freq: Two times a day (BID) | ORAL | Status: DC

## 2014-05-10 NOTE — Clinical Social Work Note (Signed)
Clinical Social Worker has arranged ambulance transport via [facility] for 3:30PM to patient's residence in [place]. Pt's son present at bedside and plans to lead PTAR to pt's home. CSW noted patient will discharge home with Home Health services. RN notified.   Clinical Social Worker will sign off for now as social work intervention is no longer needed. Please consult [place] again if new need arises.  Glendon Axe, MSW, LCSWA [phone] 05/10/2014 3:40 PM

## 2014-05-10 NOTE — Progress Notes (Signed)
Talked to patient about DCP; patient does not want to go to [place] rehab at discharge and wants to go home; [facility] choices offered, patient chose [place]; [facility] RN with [facility] called for arrangements; [name] [phone]

## 2014-05-10 NOTE — Discharge Summary (Signed)
Physician Discharge Summary  [name] [id number] DOB: 05/03/1966 DOA: 05/02/2014  PCP: [name], MD  Admit date: 05/02/2014 Discharge date: 05/10/2014  Time spent: 30 minutes  Recommendations for Outpatient Follow-up:  1. Follow up with PCP in 1-2 weeks  Discharge Diagnoses:  Principal Problem:   Status epilepticus Active Problems:   Paraplegia   Suprapubic catheter   Respiratory failure, acute   Encephalopathy acute   Hypertensive emergency without congestive heart failure   UTI (lower urinary tract infection)   Seizure   Discharge Condition: Improved  Diet recommendation: Regular  Filed Weights   05/08/14 0512 05/09/14 0610 05/10/14 0500  Weight: 88.587 kg (195 lb 4.8 oz) 84.732 kg (186 lb 12.8 oz) 85.276 kg (188 lb)    History of present illness:  Please see admit h and p from 12/7 for details. Briefly, pt presented to the ED with status epilepticus. The patient required intubation initially for airway protection and was admitted for further workup.  Hospital Course:  1. Status epilepticus 1. Initially intubated for airway protection, since extubated 2. Pt is cont on Keppra, had d/c'd Vimpat per Neurology 3. Remains stable thus far 2. Questionable CVA on CT 1. Per Neurology, findings on MRI likely related to PRES 2. Pt was seen by Neurology with recs for outpatient repeat MRI in 1-2 weeks to assess evolution of changes 3. Pt continued initially with hallucinations 4. Mental status improved greatly with improvement in blood pressure control 5. Pt had been seen by PT and OT with recs for CIR 6. Pt and family feel strongly about going home instead. Will d/c home with home health 3. Malignant htn 1. BP stable, improved 2. Cont to titrate bp meds as tolerated 3. Added metoprolol to regimen and increased clonidine dose 4. Mild acute renal insufficiency 1. Cr had been trending up slowly, given IVF 2. Avoid nephrotoxic agents if  possible 5. UTI 1. Completed course of rocephin 6. Questionable recent aspiration 7. DVT prophylaxis 1. Heparin subQ   Consultations:  Neurology  CIR  Discharge Exam: Filed Vitals:   05/10/14 0207 05/10/14 0500 05/10/14 0621 05/10/14 0945  BP: 135/75  150/81 148/86  Pulse: 80  89 92  Temp: 97.7 F (36.5 C)  97.4 F (36.3 C) 98.3 F (36.8 C)  TempSrc: Oral  Oral Oral  Resp: 18  18 18   Height:      Weight:  85.276 kg (188 lb)    SpO2: 100%  96% 97%    General: awake, in nad Cardiovascular: regular, s1, s2 Respiratory: normal resp effort, no wheezing  Discharge Instructions     Medication List    STOP taking these medications        imipramine 50 MG tablet  Commonly known as:  TOFRANIL     Lacosamide 100 MG Tabs     naproxen 500 MG tablet  Commonly known as:  NAPROSYN     sulfamethoxazole-trimethoprim 800-160 MG per tablet  Commonly known as:  SEPTRA DS      TAKE these medications        amLODipine 10 MG tablet  Commonly known as:  NORVASC  Take 1 tablet (10 mg total) by mouth daily.     baclofen 20 MG tablet  Commonly known as:  LIORESAL  Take 40 mg by mouth 4 (four) times daily.     cloNIDine 0.2 MG tablet  Commonly known as:  CATAPRES  Take 1 tablet (0.2 mg total) by mouth 3 (three) times daily.  docusate sodium 100 MG capsule  Commonly known as:  COLACE  Take 100 mg by mouth at bedtime.     furosemide 40 MG tablet  Commonly known as:  LASIX  Take 40 mg by mouth daily.     gabapentin 300 MG capsule  Commonly known as:  NEURONTIN  Take 300 mg by mouth 3 (three) times daily.     levETIRAcetam 1000 MG tablet  Commonly known as:  KEPPRA  Take 1 tablet (1,000 mg total) by mouth 2 (two) times daily.     metoprolol tartrate 25 MG tablet  Commonly known as:  LOPRESSOR  Take 1 tablet (25 mg total) by mouth 2 (two) times daily.     multivitamin with minerals Tabs tablet  Take 1 tablet by mouth daily.     pantoprazole 40 MG tablet   Commonly known as:  PROTONIX  Take 1 tablet (40 mg total) by mouth daily at 12 noon.     potassium chloride SA 20 MEQ tablet  Commonly known as:  K-DUR,KLOR-CON  Take 20 mEq by mouth daily.     TOVIAZ 8 MG Tb24 tablet  Generic drug:  fesoterodine  Take 8 mg by mouth daily.       Allergies  Allergen Reactions  . Quinine Derivatives Other (See Comments)    Alters mental status   Follow-up Information    Follow up with FANTA,TESFAYE, MD. Schedule an appointment as soon as possible for a visit in 1 week.   Specialty:  Internal Medicine   Contact information:   [place]  [phone]        The results of significant diagnostics from this hospitalization (including imaging, microbiology, ancillary and laboratory) are listed below for reference.    Significant Diagnostic Studies: Ct Head Wo Contrast  05/02/2014   CLINICAL DATA:  Patient arrives via EMS from home c/o witnessed seizure by family member. Reports upper body shaking. Arrives lethargic but alert, follows simple commands. Pt is paraplegic and unable to commuincate due to intubation  EXAM: CT HEAD WITHOUT CONTRAST  TECHNIQUE: Contiguous axial images were obtained from the base of the skull through the vertex without intravenous contrast.  COMPARISON:  06/08/2013.  FINDINGS: There is a new relatively well-defined area of hypoattenuation in the left parietal lobe extending to involve the left occipital lobe. This has the appearance of a subacute infarct.  Well-defined area of hypoattenuation in the superior left frontal lobe is stable from the prior exam consistent with an old infarct.  There are no other areas of abnormal parenchymal attenuation. No other evidence of an infarct.  Ventricles are normal configuration. There is ventricular enlargement, mostly the lateral ventricles, which is stable from the prior exam. No hydrocephalus.  No parenchymal masses or mass effect. No extra-axial masses  or abnormal fluid collections.  No intracranial hemorrhage.  Visualized sinuses and mastoid air cells are clear. No skull lesion.  IMPRESSION: 1. There is a well-defined area of hypoattenuation in the left parietal and occipital lobes consistent with a subacute infarct. This is new from the prior head CT. 2. No other evidence of a recent or acute abnormality. No other change from the prior study. 3. No intracranial hemorrhage.   Electronically Signed   By: Lajean Manes M.D.   On: 05/02/2014 20:49   Mr Brain Wo Contrast  05/03/2014   CLINICAL DATA:  History of seizures diagnosed 1 year ago. Elevated blood pressure.  EXAM: MRI HEAD WITHOUT CONTRAST  TECHNIQUE:  Multiplanar, multiecho pulse sequences of the brain and surrounding structures were obtained without intravenous contrast.  COMPARISON:  CT head 05/02/2014.  FINDINGS: Asymmetric LEFT much greater than RIGHT area facilitated diffusion in the occipital lobes, having the appearance of vasogenic edema, but no underlying mass. No areas of true restricted diffusion. Favor PRES. No intracranial mass or acute bleed.  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 Rounded ventricles out of proportion to the degree of cortical atrophy suggesting possible NPH. No midline abnormality.  Multiple foci of chronic hemorrhage throughout the brain including LEFT periventricular white matter, RIGHT thalamus, LEFT corpus callosum, and surrounding the LEFT posterior frontal infarct.  Flow voids are maintained.  Negative orbits.  No sinus disease.  IMPRESSION: Suspect PRES, with LEFT >> RIGHT occipital vasogenic edema. No true restricted diffusion.  Multiple foci of chronic hemorrhage, including surrounding the well-defined remote LEFT posterior frontal infarct.  Rounded ventricles out of proportion to the degree of cortical atrophy, possible NPH.  No intracranial mass lesion.   Electronically Signed   By: Rolla Flatten M.D.   On: 05/03/2014 13:48   Dg Chest  Port 1 View  05/04/2014   CLINICAL DATA:  Airspace disease.  EXAM: PORTABLE CHEST - 1 VIEW  COMPARISON:  05/03/2014.  FINDINGS: Endotracheal tube and NG tube in stable position. Left lower lobe atelectasis and/or infiltrate noted. No pleural effusion or pneumothorax. Stable cardiomegaly with normal pulmonary vascularity. Prior thoracic spine fusion.  IMPRESSION: 1. Lines and tubes in stable position. 2. Left lower lobe atelectasis and/or infiltrate.   Electronically Signed   By: [place]   On: 05/04/2014 07:29   Dg Chest Port 1 View  05/03/2014   CLINICAL DATA:  Endotracheal tube. Respiratory distress. Shortness of breath.  EXAM: PORTABLE CHEST - 1 VIEW  COMPARISON:  05/02/2014  FINDINGS: Endotracheal tube tip measures 1.7 cm above the carinal. Enteric tube tip projects over the EG junction region with proximal side hole projecting over the distal esophagus. Mild cardiac enlargement. No focal consolidation or airspace disease in the lungs. Postoperative changes in the thoracic spine. No pneumothorax.  IMPRESSION: Enteric tube projects over the distal esophagus. Cardiac enlargement. No evidence of active pulmonary disease.   Electronically Signed   By: Lucienne Capers M.D.   On: 05/03/2014 05:56   Dg Chest Port 1 View  05/02/2014   CLINICAL DATA:  Endotracheal and orogastric tube placements. Seizures. Paraplegia.  EXAM: PORTABLE CHEST - 1 VIEW  COMPARISON:  05/25/2013  FINDINGS: Harrington rods in the thorax partially obscure the endotracheal tube, but the tube is thought to be about 1.7 cm above the carina. Nasogastric tube enters the stomach.  Low lung volumes. Indistinct airspace opacity at the left lung base although improved from 05/25/2013. Bilateral interstitial accentuation. Indistinct aortic arch, as before.  IMPRESSION: 1. Improved edema compared to prior. There is some continued airspace opacity at the left lung base. 2. Endotracheal tube is difficult to visualize due to the Harrington  rods, but is thought to be about 1.7 cm above the carina.   Electronically Signed   By: Sherryl Barters M.D.   On: 05/02/2014 18:20   Dg Abd Portable 1v  05/03/2014   CLINICAL DATA:  OG placement.  EXAM: PORTABLE ABDOMEN - 1 VIEW  COMPARISON:  None.  FINDINGS: Enteric tube tip is localized to the left upper quadrant days projected over the the EG junction. Proximal side hole is projected over the distal esophagus. Suggest advancement for more optimal placement over  the stomach. Visualized small and large bowel are not distended. Postoperative changes in the thoracic spine.  IMPRESSION: Enteric tube tip projects over the EG junction, possibly in the distal esophagus.   Electronically Signed   By: Lucienne Capers M.D.   On: 05/03/2014 05:54    Microbiology: Recent Results (from the past 240 hour(s))  MRSA PCR Screening     Status: None   Collection Time: 05/02/14 11:57 PM  Result Value Ref Range Status   MRSA by PCR NEGATIVE NEGATIVE Final    Comment:        The GeneXpert MRSA Assay (FDA approved for NASAL specimens only), is one component of a comprehensive MRSA colonization surveillance program. It is not intended to diagnose MRSA infection nor to guide or monitor treatment for MRSA infections.   Culture, blood (routine x 2)     Status: None   Collection Time: 05/03/14  2:42 AM  Result Value Ref Range Status   Specimen Description BLOOD LEFT HAND  Final   Special Requests BOTTLES DRAWN AEROBIC ONLY 1CC  Final   Culture  Setup Time   Final    05/03/2014 10:50 Performed at [facility]    Culture   Final    NO GROWTH 5 DAYS Note: Culture results may be compromised due to an inadequate volume of blood received in culture bottles. Performed at [facility]    Report Status 05/09/2014 FINAL  Final  Culture, blood (routine x 2)     Status: None   Collection Time: 05/03/14  2:52 AM  Result Value Ref Range Status   Specimen Description BLOOD LEFT ARM  Final    Special Requests BOTTLES DRAWN AEROBIC ONLY 1CC  Final   Culture  Setup Time   Final    05/03/2014 10:48 Performed at [facility]    Culture   Final    NO GROWTH 5 DAYS Note: Culture results may be compromised due to an inadequate volume of blood received in culture bottles. Performed at [facility]    Report Status 05/09/2014 FINAL  Final  Urine culture     Status: None   Collection Time: 05/03/14  5:34 AM  Result Value Ref Range Status   Specimen Description URINE, SUPRAPUBIC  Final   Special Requests NONE  Final   Culture  Setup Time   Final    05/03/2014 10:58 Performed at [place]   Final    >=100,000 COLONIES/ML Performed at [facility]    Culture   Final    Multiple bacterial morphotypes present, none predominant. Suggest appropriate recollection if clinically indicated. Performed at [facility]    Report Status 05/04/2014 FINAL  Final  Culture, respiratory (NON-Expectorated)     Status: None   Collection Time: 05/03/14  5:34 AM  Result Value Ref Range Status   Specimen Description TRACHEAL ASPIRATE  Final   Special Requests NONE  Final   Gram Stain   Final    FEW WBC PRESENT,BOTH PMN AND MONONUCLEAR RARE SQUAMOUS EPITHELIAL CELLS PRESENT FEW GRAM POSITIVE COCCI IN PAIRS IN CLUSTERS Performed at [facility]    Culture   Final    Non-Pathogenic Oropharyngeal-type Flora Isolated. Performed at [facility]    Report Status 05/05/2014 FINAL  Final     Labs: Basic Metabolic Panel:  Recent Labs Lab 05/04/14 0250 05/05/14 0222 05/07/14 0540 05/08/14 0547 05/10/14 1145  NA 139 143 138 138 141  K 3.6* 3.7 3.6* 2.7* 3.4*  CL 105 111 109 107 107  CO2 20 16* 13* 18* 24  GLUCOSE 89 107* 89 86 100*  BUN 13 13 11 9 7   CREATININE 1.24* 1.13* 1.20* 1.30* 1.09  CALCIUM 7.8* 7.8* 7.5* 7.4* 7.9*  MG 2.0  --   --   --   --   PHOS 2.7  --   --   --   --    Liver Function Tests: No  results for input(s): AST, ALT, ALKPHOS, BILITOT, PROT, ALBUMIN in the last 168 hours. No results for input(s): LIPASE, AMYLASE in the last 168 hours. No results for input(s): AMMONIA in the last 168 hours. CBC:  Recent Labs Lab 05/04/14 0250 05/05/14 0222 05/08/14 0547  WBC 8.6 10.0 5.1  HGB 10.8* 11.7* 10.2*  HCT 33.2* 36.4 30.8*  MCV 86.9 89.0 86.3  PLT 210 261 199   Cardiac Enzymes: No results for input(s): CKTOTAL, CKMB, CKMBINDEX, TROPONINI in the last 168 hours. BNP: BNP (last 3 results)  Recent Labs  05/21/13 1145  PROBNP 1928.0*   CBG:  Recent Labs Lab 05/03/14 1900 05/04/14 0029 05/05/14 1512 05/05/14 1905 05/05/14 2339  GLUCAP 84 86 94 114* 119*   Signed:  Essa Malachi K  [facility] 05/10/2014, 1:12 PM

## 2014-05-10 NOTE — Progress Notes (Signed)
Rehab admissions - Earlier this am, I received a message from Dr. Wyline Copas updating me that pt's cognition appears much clearer and that further discharge planning needs to be determined.   I met with pt in follow up to yesterday's discussion and spoke with pt and her son (by phone). Both son and pt were involved in this discussion. After explaining the opportunity for a possible inpatient rehab stay or the option of going home with limited follow up home health support, both pt and son firmly stated they wanted the pt to go home. Pt was very excited about going home. I presented the option of inpatient rehab in several ways but pt was not willing to come to inpatient rehab and both pt/son understood that pt would receive little to no follow up home health services due to her Medicaid coverage.   Both pt/son state that her son/son's girlfriend can help do her exercises at home and that they have an "exercise area" where they can do her leg stretches. In addition, son stated that he has no concerns providing the needed care his mom needs and that they have no new equipment needs.  I explained that Hassan Rowan, case manager would now be following up with pt/family. I called and updated Dr. Wyline Copas of pt/family decision to pursue home. I also updated Hassan Rowan directly and pt's RN.  I will now sign off pt's case. Please call me with any questions. Thanks.  Nanetta Batty, PT Rehabilitation Admissions Coordinator [phone]

## 2014-05-10 NOTE — Progress Notes (Signed)
Chaplain initiated visit with pt. Pt told chaplain about her children and grandchildren. Pt is very excited about her granddaughter coming up for the holidays, and this is a major motivation for wanting to go home. Pt also expressed that she wanted chaplain to meet her son, and chaplain encouraged pt to ask her nurse to page chaplain if son comes. Page chaplain as needed.    05/10/14 1000  Clinical Encounter Type  Visited With Patient  Visit Type Initial;Spiritual support  Spiritual Encounters  Spiritual Needs Emotional  Stress Factors  Patient Stress Factors Family relationships  Hong Moring, Barbette Hair, Chaplain 05/10/2014 10:10 AM
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
## 2014-05-31 ENCOUNTER — Telehealth: Payer: Self-pay | Admitting: *Deleted

## 2014-05-31 ENCOUNTER — Encounter: Payer: Self-pay | Admitting: Adult Health

## 2014-05-31 NOTE — Telephone Encounter (Signed)
Pt states she is unsure if she can make it to her appt today. She states she is a paraplegic and is going to see if her son can bring her but he would not be able to help her get on the table. Pt states she weighs 160 pounds. Informed pt, we would be able to assist her to the table if she could get transportation here. Pt verbalized understanding.

## 2014-06-14 ENCOUNTER — Ambulatory Visit (INDEPENDENT_AMBULATORY_CARE_PROVIDER_SITE_OTHER): Payer: Medicaid Other | Admitting: Neurology

## 2014-06-14 ENCOUNTER — Encounter: Payer: Self-pay | Admitting: Neurology

## 2014-06-14 VITALS — BP 115/79 | HR 71

## 2014-06-14 DIAGNOSIS — I639 Cerebral infarction, unspecified: Secondary | ICD-10-CM

## 2014-06-14 DIAGNOSIS — I10 Essential (primary) hypertension: Secondary | ICD-10-CM | POA: Insufficient documentation

## 2014-06-14 DIAGNOSIS — I6783 Posterior reversible encephalopathy syndrome: Secondary | ICD-10-CM

## 2014-06-14 MED ORDER — BACLOFEN 20 MG PO TABS: 20.0000 mg | ORAL_TABLET | Freq: Four times a day (QID) | ORAL | Status: DC

## 2014-06-14 NOTE — Patient Instructions (Signed)
Overall you are doing fairly well but I do want to suggest a few things today:   Remember to drink plenty of fluid, eat healthy meals and do not skip any meals. Try to eat protein with a every meal and eat a healthy snack such as fruit or nuts in between meals. Try to keep a regular sleep-wake schedule and try to exercise daily, particularly in the form of walking, 20-30 minutes a day, if you can.   As far as your medications are concerned, I would like to suggest: Continue current medications  As far as diagnostic testing: repeat MRi of the brain  I would like to see you back in 3 months, sooner if we need to. Please call us with any interim questions, concerns, problems, updates or refill requests.   Please also call us for any test results so we can go over those with you on the phone.  My clinical assistant and will answer any of your questions and relay your messages to me and also relay most of my messages to you.   Our phone number is [phone]. We also have an after hours call service for urgent matters and there is a physician on-call for urgent questions. For any emergencies you know to call 911 or go to the nearest emergency room
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
## 2014-06-15 ENCOUNTER — Ambulatory Visit (INDEPENDENT_AMBULATORY_CARE_PROVIDER_SITE_OTHER): Payer: Medicaid Other | Admitting: Adult Health

## 2014-06-15 ENCOUNTER — Encounter: Payer: Self-pay | Admitting: Adult Health

## 2014-06-15 ENCOUNTER — Other Ambulatory Visit (HOSPITAL_COMMUNITY)
Admission: RE | Admit: 2014-06-15 | Discharge: 2014-06-15 | Disposition: A | Discharge status: Home / Self Care | Payer: Medicaid Other | Source: Ambulatory Visit | Attending: Adult Health | Admitting: Adult Health

## 2014-06-15 VITALS — BP 100/72

## 2014-06-15 DIAGNOSIS — Z1151 Encounter for screening for human papillomavirus (HPV): Secondary | ICD-10-CM | POA: Diagnosis present

## 2014-06-15 DIAGNOSIS — Z01419 Encounter for gynecological examination (general) (routine) without abnormal findings: Secondary | ICD-10-CM | POA: Insufficient documentation

## 2014-06-15 DIAGNOSIS — N939 Abnormal uterine and vaginal bleeding, unspecified: Secondary | ICD-10-CM | POA: Insufficient documentation

## 2014-06-15 DIAGNOSIS — Z113 Encounter for screening for infections with a predominantly sexual mode of transmission: Secondary | ICD-10-CM | POA: Insufficient documentation

## 2014-06-15 DIAGNOSIS — Z1212 Encounter for screening for malignant neoplasm of rectum: Secondary | ICD-10-CM

## 2014-06-15 DIAGNOSIS — Z0000 Encounter for general adult medical examination without abnormal findings: Secondary | ICD-10-CM

## 2014-06-15 HISTORY — DX: Abnormal uterine and vaginal bleeding, unspecified: N93.9

## 2014-06-15 LAB — CBC
HCT: 38 % (ref 34.0–46.6)
Hemoglobin: 12.8 g/dL (ref 11.1–15.9)
MCH: 28.8 pg (ref 26.6–33.0)
MCHC: 33.7 g/dL (ref 31.5–35.7)
MCV: 86 fL (ref 79–97)
Platelets: 377 10*3/uL (ref 150–379)
RBC: 4.44 x10E6/uL (ref 3.77–5.28)
RDW: 15 % (ref 12.3–15.4)
WBC: 8.5 10*3/uL (ref 3.4–10.8)

## 2014-06-15 LAB — COMPREHENSIVE METABOLIC PANEL
ALBUMIN: 2.4 g/dL — AB (ref 3.5–5.5)
ALT: 11 IU/L (ref 0–32)
AST: 10 IU/L (ref 0–40)
Albumin/Globulin Ratio: 0.8 — ABNORMAL LOW (ref 1.1–2.5)
Alkaline Phosphatase: 108 IU/L (ref 39–117)
BILIRUBIN TOTAL: 0.2 mg/dL (ref 0.0–1.2)
BUN/Creatinine Ratio: 9 (ref 9–23)
BUN: 10 mg/dL (ref 6–24)
CALCIUM: 8.2 mg/dL — AB (ref 8.7–10.2)
CHLORIDE: 99 mmol/L (ref 97–108)
CO2: 26 mmol/L (ref 18–29)
CREATININE: 1.12 mg/dL — AB (ref 0.57–1.00)
GFR calc Af Amer: 68 mL/min/{1.73_m2} (ref >59)
GFR, EST NON AFRICAN AMERICAN: 59 mL/min/{1.73_m2} — AB (ref >59)
GLUCOSE: 104 mg/dL — AB (ref 65–99)
Globulin, Total: 3 g/dL (ref 1.5–4.5)
POTASSIUM: 3.6 mmol/L (ref 3.5–5.2)
Sodium: 137 mmol/L (ref 134–144)
TOTAL PROTEIN: 5.4 g/dL — AB (ref 6.0–8.5)

## 2014-06-15 LAB — HEMOCCULT GUIAC POC 1CARD (OFFICE): Fecal Occult Blood, POC: NEGATIVE
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
## 2014-06-15 NOTE — Patient Instructions (Signed)
Dysfunctional Uterine Bleeding Normally, menstrual periods begin between ages 11 to 17 in young women. A normal menstrual cycle/period may begin every 23 days up to 35 days and lasts from 1 to 7 days. Around 12 to 14 days before your menstrual period starts, ovulation (ovary produces an egg) occurs. When counting the time between menstrual periods, count from the first day of bleeding of the previous period to the first day of bleeding of the next period. Dysfunctional (abnormal) uterine bleeding is bleeding that is different from a normal menstrual period. Your periods may come earlier or later than usual. They may be lighter, have blood clots or be heavier. You may have bleeding between periods, or you may skip one period or more. You may have bleeding after sexual intercourse, bleeding after menopause, or no menstrual period. CAUSES   Pregnancy (normal, miscarriage, tubal).  IUDs (intrauterine device, birth control).  Birth control pills.  Hormone treatment.  Menopause.  Infection of the cervix.  Blood clotting problems.  Infection of the inside lining of the uterus.  Endometriosis, inside lining of the uterus growing in the pelvis and other female organs.  Adhesions (scar tissue) inside the uterus.  Obesity or severe weight loss.  Uterine polyps inside the uterus.  Cancer of the vagina, cervix, or uterus.  Ovarian cysts or polycystic ovary syndrome.  Medical problems (diabetes, thyroid disease).  Uterine fibroids (noncancerous tumor).  Problems with your female hormones.  Endometrial hyperplasia, very thick lining and enlarged cells inside of the uterus.  Medicines that interfere with ovulation.  Radiation to the pelvis or abdomen.  Chemotherapy. DIAGNOSIS   Your doctor will discuss the history of your menstrual periods, medicines you are taking, changes in your weight, stress in your life, and any medical problems you may have.  Your doctor will do a physical  and pelvic examination.  Your doctor may want to perform certain tests to make a diagnosis, such as:  Pap test.  Blood tests.  Cultures for infection.  CT scan.  Ultrasound.  Hysteroscopy.  Laparoscopy.  MRI.  Hysterosalpingography.  D and C.  Endometrial biopsy. TREATMENT  Treatment will depend on the cause of the dysfunctional uterine bleeding (DUB). Treatment may include:  Observing your menstrual periods for a couple of months.  Prescribing medicines for medical problems, including:  Antibiotics.  Hormones.  Birth control pills.  Removing an IUD (intrauterine device, birth control).  Surgery:  D and C (scrape and remove tissue from inside the uterus).  Laparoscopy (examine inside the abdomen with a lighted tube).  Uterine ablation (destroy lining of the uterus with electrical current, laser, heat, or freezing).  Hysteroscopy (examine cervix and uterus with a lighted tube).  Hysterectomy (remove the uterus). HOME CARE INSTRUCTIONS   If medicines were prescribed, take exactly as directed. Do not change or switch medicines without consulting your caregiver.  Long term heavy bleeding may result in iron deficiency. Your caregiver may have prescribed iron pills. They help replace the iron that your body lost from heavy bleeding. Take exactly as directed.  Do not take aspirin or medicines that contain aspirin one week before or during your menstrual period. Aspirin may make the bleeding worse.  If you need to change your sanitary pad or tampon more than once every 2 hours, stay in bed with your feet elevated and a cold pack on your lower abdomen. Rest as much as possible, until the bleeding stops or slows down.  Eat well-balanced meals. Eat foods high in iron. Examples   are:  Leafy green vegetables.  Whole-grain breads and cereals.  Eggs.  Meat.  Liver.  Do not try to lose weight until the abnormal bleeding has stopped and your blood iron level is  back to normal. Do not lift more than ten pounds or do strenuous activities when you are bleeding.  For a couple of months, make note on your calendar, marking the start and ending of your period, and the type of bleeding (light, medium, heavy, spotting, clots or missed periods). This is for your caregiver to better evaluate your problem. SEEK MEDICAL CARE IF:   You develop nausea (feeling sick to your stomach) and vomiting, dizziness, or diarrhea while you are taking your medicine.  YUS ou are getting lightheaded or weak.  You have any problems that may be related to the medicine you are taking.  You develop pain with your DUB.  You want to remove your IUD.  You want to stop or change your birth control pills or hormones.  You have any type of abnormal bleeding mentioned above.  You are over 40 years old and have not had a menstrual period yet.  You are 49 years old and you are still having menstrual periods.  You have any of the symptoms mentioned above.  You develop a rash. SEEK IMMEDIATE MEDICAL CARE IF:   An oral temperature above 102 F (38.9 C) develops.  You develop chills.  You are changing your sanitary pad or tampon more than once an hour.  You develop abdominal pain.  You pass out or faint. Document Released: 05/10/2000 Document Revised: 08/05/2011 Document Reviewed: 04/11/2009 [facility] Patient Information 2015 [place], [place]. This information is not intended to replace advice given to you by your health care provider. Make sure you discuss any questions you have with your health care provider. [place] 1/28 at 1 pm at [facility] we will talk after [place]

## 2014-06-16 ENCOUNTER — Other Ambulatory Visit: Payer: Self-pay | Admitting: Adult Health

## 2014-06-16 DIAGNOSIS — N939 Abnormal uterine and vaginal bleeding, unspecified: Secondary | ICD-10-CM

## 2014-06-17 LAB — CYTOLOGY - PAP

## 2014-06-23 ENCOUNTER — Ambulatory Visit (HOSPITAL_COMMUNITY)
Admission: RE | Admit: 2014-06-23 | Discharge: 2014-06-23 | Disposition: A | Discharge status: Home / Self Care | Payer: Medicaid Other | Source: Ambulatory Visit | Attending: Adult Health | Admitting: Adult Health

## 2014-06-23 DIAGNOSIS — N939 Abnormal uterine and vaginal bleeding, unspecified: Secondary | ICD-10-CM | POA: Insufficient documentation

## 2014-06-24 ENCOUNTER — Telehealth: Payer: Self-pay | Admitting: Adult Health

## 2014-06-24 NOTE — Telephone Encounter (Signed)
Pt aware [place] normal, options of megace,depo and ablation discussed or can just wait and see how next period is, think about it and let me know

## 2014-07-04 ENCOUNTER — Telehealth: Payer: Self-pay | Admitting: *Deleted

## 2014-07-04 NOTE — Telephone Encounter (Signed)
Talked with patient about lab results. Advised her to try ensure. Patient stated this gives her diarrhea. I advised her to try a different protein shake and see if that helps. Patient verbalized understanding.

## 2014-07-06 ENCOUNTER — Encounter (INDEPENDENT_AMBULATORY_CARE_PROVIDER_SITE_OTHER): Payer: Medicaid Other | Admitting: Diagnostic Neuroimaging

## 2014-07-06 ENCOUNTER — Ambulatory Visit
Admission: RE | Admit: 2014-07-06 | Discharge: 2014-07-06 | Disposition: A | Discharge status: Home / Self Care | Payer: Medicaid Other | Source: Ambulatory Visit | Attending: Neurology | Admitting: Neurology

## 2014-07-06 DIAGNOSIS — I639 Cerebral infarction, unspecified: Secondary | ICD-10-CM

## 2014-07-06 DIAGNOSIS — I6783 Posterior reversible encephalopathy syndrome: Secondary | ICD-10-CM

## 2014-07-06 MED ORDER — GADOBENATE DIMEGLUMINE 529 MG/ML IV SOLN
17.0000 mL | Freq: Once | INTRAVENOUS | Status: AC | PRN
  Administered 2014-07-06: 17 mL via INTRAVENOUS

## 2014-07-06 MED ORDER — GADOBENATE DIMEGLUMINE 529 MG/ML IV SOLN: 17.0000 mL | Freq: Once | INTRAVENOUS | Status: AC | PRN

## 2014-07-18 ENCOUNTER — Telehealth: Payer: Self-pay | Admitting: Neurology

## 2014-07-18 NOTE — Telephone Encounter (Signed)
Have tried calling patient several times over the last 2 weeks. The phone rings, no voicemail. Will prepare a letetr for patient.

## 2014-07-19 ENCOUNTER — Encounter: Payer: Self-pay | Admitting: *Deleted

## 2014-07-20 NOTE — Telephone Encounter (Signed)
Spoke to patient. Gave results of MRi (below). Discussed repeat imaging in 4-6 weeks. Will call her to set it up at that time.   IMPRESSION:  Abnormal MRI brain (with and without) demonstrating: 1. Resolution of prior left > right occipital vasogenic edema, compared to study from 05/03/14. Therefore, the clinical and neuroimaging characteristic are consistent with PRES (posterior reversible encephalopathy syndrome).  2. Left frontal/parietal convexity, focal encephalomalacia with gliosis, likely from chronic ischemic infarction. However, the surrounding gliosis is slightly larger and more prominent in the current study than from 05/03/14. Consider follow up imaging study (MRI brain with and without contrast) to exclude a progressive neoplastic/inflammatory process. 3. Stable foci of hemosiderin deposition (chronic hemorrhage) in the left frontal region, body of corpus callosum and punctate focus in the left periventricular white matter.  4. Severe corpus callosum atrophy. Mild ventriculomegaly on ex vacuo basis. 5. No acute findings. No abnormal enhancing lesions.

## 2014-08-02 ENCOUNTER — Encounter (HOSPITAL_COMMUNITY): Payer: Self-pay

## 2014-08-02 ENCOUNTER — Emergency Department (HOSPITAL_COMMUNITY)
Admission: EM | Admit: 2014-08-02 | Discharge: 2014-08-02 | Disposition: A | Discharge status: Home / Self Care | Payer: Medicaid Other | Attending: Emergency Medicine | Admitting: Emergency Medicine

## 2014-08-02 DIAGNOSIS — L89152 Pressure ulcer of sacral region, stage 2: Secondary | ICD-10-CM | POA: Diagnosis not present

## 2014-08-02 DIAGNOSIS — Z87828 Personal history of other (healed) physical injury and trauma: Secondary | ICD-10-CM | POA: Insufficient documentation

## 2014-08-02 DIAGNOSIS — Z9889 Other specified postprocedural states: Secondary | ICD-10-CM | POA: Diagnosis not present

## 2014-08-02 DIAGNOSIS — Z9359 Other cystostomy status: Secondary | ICD-10-CM | POA: Insufficient documentation

## 2014-08-02 DIAGNOSIS — Z8541 Personal history of malignant neoplasm of cervix uteri: Secondary | ICD-10-CM | POA: Diagnosis not present

## 2014-08-02 DIAGNOSIS — Z8742 Personal history of other diseases of the female genital tract: Secondary | ICD-10-CM | POA: Diagnosis not present

## 2014-08-02 DIAGNOSIS — G40909 Epilepsy, unspecified, not intractable, without status epilepticus: Secondary | ICD-10-CM | POA: Insufficient documentation

## 2014-08-02 DIAGNOSIS — Z79899 Other long term (current) drug therapy: Secondary | ICD-10-CM | POA: Insufficient documentation

## 2014-08-02 DIAGNOSIS — L89159 Pressure ulcer of sacral region, unspecified stage: Secondary | ICD-10-CM | POA: Diagnosis present

## 2014-08-02 NOTE — ED Notes (Signed)
Pts foley bag emptied prior to transfer home.

## 2014-08-02 NOTE — Discharge Instructions (Signed)
As discussed, your evaluation today has been largely reassuring.  But, it is important that you monitor your condition carefully, and do not hesitate to return to the ED if you develop new, or concerning changes in your condition.  Your sacral decubitus ulcer does not currently appear infected, complicated.  However, it is important that you follow-up with your primary care physician and our wound care center for appropriate ongoing management.   Pressure Ulcer A pressure ulcer is a sore that has formed from the breakdown of skin and exposure of deeper layers of tissue. It develops in areas of the body where there is unrelieved pressure. Pressure ulcers are usually found over a bony area, such as the shoulder blades, spine, lower back, hips, knees, ankles, and heels. Pressure ulcers vary in severity. Your health care provider may determine the severity (stage) of your pressure ulcer. The stages include:  Stage I--The skin is red, and when the skin is pressed, it stays red.  Stage II--The top layer of skin is gone, and there is a shallow, pink ulcer.  Stage III--The ulcer becomes deeper, and it is more difficult to see the whole wound. Also, there may be yellow or brown parts, as well as pink and red parts.  Stage IV--The ulcer may be deep and red, pink, brown, white, or yellow. Bone or muscle may be seen.  Unstageable pressure ulcer--The ulcer is covered almost completely with black, brown, or yellow tissue. It is not known how deep the ulcer is or what stage it is until this covering comes off.  Suspected deep tissue injury--A person's skin can be injured from pressure or pulling on the skin when his or her position is changed. The skin appears purple or maroon. There may not be an opening in the skin, but there could be a blood-filled blister. This deep tissue injury is often difficult to see in people with darker skin tones. The site may open and become deeper in time. However, early  interventions will help the area heal and may prevent the area from opening. CAUSES  Pressure ulcers are caused by pressure against the skin that limits the flow of blood to the skin and nearby tissues. There are many risk factors that can lead to pressure sores. RISK FACTORS  Decreased ability to move.  Decreased ability to feel pain or discomfort.  Excessive skin moisture from urine, stool, sweat, or secretions.  Poor nutrition.  Dehydration.  Tobacco, drug, or alcohol abuse.  Having someone pull on bedsheets that are under you, such as when health care workers are changing your position in a hospital bed.  Obesity.  Increased adult age.  Hospitalization in a critical care unit for longer than 4 days with use of medical devices.  Prolonged use of medical devices.  Critical illness.  Anemia.  Traumatic brain injury.  Spinal cord injury.  Stroke.  Diabetes.  Poor blood glucose control.  Low blood pressure (hypotension).  Low oxygen levels.  Medicines that reduce blood flow.  Infection. DIAGNOSIS  Your health care provider will diagnose your pressure ulcer based on its appearance. The health care provider may determine the stage of your pressure ulcer as well. Tests may be done to check for infection, to assess your circulation, or to check for other diseases, such as diabetes. TREATMENT  Treatment of your pressure ulcer begins with determining what stage the ulcer is in. Your treatment team may include your health care provider, a wound care specialist, a nutritionist, a physical therapist,  and a Psychologist, sport and exercise. Possible treatments may include:   Moving or repositioning every 1-2 hours.  Using beds or mattresses to shift your body weight and pressure points frequently.  Improving your diet.  Cleaning and bandaging (dressing) the open wound.  Giving antibiotic medicines.  Removing damaged tissue.  Surgery and sometimes skin grafts. HOME CARE  INSTRUCTIONS  If you were hospitalized, follow the care plan that was started in the hospital.  Avoid staying in the same position for more than 2 hours. Use padding, devices, or mattresses to cushion your pressure points as directed by your health care provider.  Eat a well-balanced diet. Take nutritional supplements and vitamins as directed by your health care provider.  Keep all follow-up appointments.  Only take over-the-counter or prescription medicines for pain, fever, or discomfort as directed by your health care provider. SEEK MEDICAL CARE IF:   Your pressure ulcer is not improving.  You do not know how to care for your pressure ulcer.  You notice other areas of redness on your skin.  You have a fever. SEEK IMMEDIATE MEDICAL CARE IF:   You have increasing redness, swelling, or pain in your pressure ulcer.  You notice pus coming from your pressure ulcer.  You notice a bad smell coming from the wound or dressing.  Your pressure ulcer opens up again. Document Released: 05/13/2005 Document Revised: 05/18/2013 Document Reviewed: 01/18/2013 [employer] Patient Information 2015 [place], [place]. This information is not intended to replace advice given to you by your health care provider. Make sure you discuss any questions you have with your health care provider.
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
## 2014-08-02 NOTE — ED Notes (Signed)
Ems called for transport back home.

## 2014-08-02 NOTE — ED Notes (Signed)
Pt here for evaluation of sacral pressure ulcers. Pt poor historian and cannot say when she is bedridden
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
## 2014-08-02 NOTE — ED Notes (Signed)
Awaiting transport home at this time.  Pt voices no complaints.

## 2014-08-12 ENCOUNTER — Encounter (HOSPITAL_COMMUNITY): Payer: Self-pay | Admitting: *Deleted

## 2014-08-12 ENCOUNTER — Emergency Department (HOSPITAL_COMMUNITY)
Admission: EM | Admit: 2014-08-12 | Discharge: 2014-08-12 | Disposition: A | Discharge status: Home / Self Care | Payer: Medicaid Other | Attending: Emergency Medicine | Admitting: Emergency Medicine

## 2014-08-12 ENCOUNTER — Emergency Department (HOSPITAL_COMMUNITY): Payer: Medicaid Other

## 2014-08-12 DIAGNOSIS — Z8541 Personal history of malignant neoplasm of cervix uteri: Secondary | ICD-10-CM | POA: Insufficient documentation

## 2014-08-12 DIAGNOSIS — G40909 Epilepsy, unspecified, not intractable, without status epilepticus: Secondary | ICD-10-CM | POA: Diagnosis not present

## 2014-08-12 DIAGNOSIS — R030 Elevated blood-pressure reading, without diagnosis of hypertension: Secondary | ICD-10-CM | POA: Diagnosis present

## 2014-08-12 DIAGNOSIS — R4182 Altered mental status, unspecified: Secondary | ICD-10-CM | POA: Insufficient documentation

## 2014-08-12 DIAGNOSIS — R531 Weakness: Secondary | ICD-10-CM

## 2014-08-12 DIAGNOSIS — Z8742 Personal history of other diseases of the female genital tract: Secondary | ICD-10-CM | POA: Diagnosis not present

## 2014-08-12 DIAGNOSIS — N289 Disorder of kidney and ureter, unspecified: Secondary | ICD-10-CM | POA: Insufficient documentation

## 2014-08-12 DIAGNOSIS — Z79899 Other long term (current) drug therapy: Secondary | ICD-10-CM | POA: Insufficient documentation

## 2014-08-12 DIAGNOSIS — L89899 Pressure ulcer of other site, unspecified stage: Secondary | ICD-10-CM | POA: Insufficient documentation

## 2014-08-12 DIAGNOSIS — L89521 Pressure ulcer of left ankle, stage 1: Secondary | ICD-10-CM | POA: Insufficient documentation

## 2014-08-12 LAB — URINE MICROSCOPIC-ADD ON

## 2014-08-12 LAB — CBC WITH DIFFERENTIAL/PLATELET
BASOS ABS: 0.1 10*3/uL (ref 0.0–0.1)
Basophils Relative: 1 % (ref 0–1)
EOS PCT: 2 % (ref 0–5)
Eosinophils Absolute: 0.2 10*3/uL (ref 0.0–0.7)
HCT: 34.6 % — ABNORMAL LOW (ref 36.0–46.0)
HEMOGLOBIN: 11.3 g/dL — AB (ref 12.0–15.0)
LYMPHS ABS: 2.5 10*3/uL (ref 0.7–4.0)
LYMPHS PCT: 26 % (ref 12–46)
MCH: 28.3 pg (ref 26.0–34.0)
MCHC: 32.7 g/dL (ref 30.0–36.0)
MCV: 86.5 fL (ref 78.0–100.0)
MONO ABS: 0.4 10*3/uL (ref 0.1–1.0)
Monocytes Relative: 4 % (ref 3–12)
NEUTROS PCT: 67 % (ref 43–77)
Neutro Abs: 6.4 10*3/uL (ref 1.7–7.7)
Platelets: 296 10*3/uL (ref 150–400)
RBC: 4 MIL/uL (ref 3.87–5.11)
RDW: 15 % (ref 11.5–15.5)
WBC: 9.5 10*3/uL (ref 4.0–10.5)

## 2014-08-12 LAB — URINALYSIS, ROUTINE W REFLEX MICROSCOPIC
BILIRUBIN URINE: NEGATIVE
Glucose, UA: NEGATIVE mg/dL
Ketones, ur: NEGATIVE mg/dL
Leukocytes, UA: NEGATIVE
Nitrite: NEGATIVE
PH: 6.5 (ref 5.0–8.0)
Protein, ur: >300 mg/dL — AB
Specific Gravity, Urine: 1.025 (ref 1.005–1.030)
UROBILINOGEN UA: 0.2 mg/dL (ref 0.0–1.0)

## 2014-08-12 LAB — COMPREHENSIVE METABOLIC PANEL
ALT: 16 U/L (ref 0–35)
AST: 19 U/L (ref 0–37)
Albumin: 2.2 g/dL — ABNORMAL LOW (ref 3.5–5.2)
Alkaline Phosphatase: 102 U/L (ref 39–117)
Anion gap: 6 (ref 5–15)
BUN: 17 mg/dL (ref 6–23)
CALCIUM: 8.4 mg/dL (ref 8.4–10.5)
CHLORIDE: 111 mmol/L (ref 96–112)
CO2: 23 mmol/L (ref 19–32)
Creatinine, Ser: 1.68 mg/dL — ABNORMAL HIGH (ref 0.50–1.10)
GFR, EST AFRICAN AMERICAN: 41 mL/min — AB (ref >90)
GFR, EST NON AFRICAN AMERICAN: 36 mL/min — AB (ref >90)
GLUCOSE: 112 mg/dL — AB (ref 70–99)
Potassium: 4.1 mmol/L (ref 3.5–5.1)
SODIUM: 140 mmol/L (ref 135–145)
Total Bilirubin: 0.4 mg/dL (ref 0.3–1.2)
Total Protein: 6.5 g/dL (ref 6.0–8.3)

## 2014-08-12 MED ORDER — SODIUM CHLORIDE 0.9 % IV BOLUS (SEPSIS)
1000.0000 mL | Freq: Once | INTRAVENOUS | Status: AC
  Administered 2014-08-12: 1000 mL via INTRAVENOUS

## 2014-08-12 MED ORDER — SODIUM CHLORIDE 0.9 % IV BOLUS (SEPSIS)
500.0000 mL | Freq: Once | INTRAVENOUS | Status: AC
  Administered 2014-08-12: 500 mL via INTRAVENOUS

## 2014-08-12 NOTE — Discharge Instructions (Signed)
Your kidney function test was slightly elevated. You must increase fluids. Recommend follow-up at [facility] wound center for your lower back. Phone number given.

## 2014-08-12 NOTE — ED Notes (Addendum)
Pt has one sacral ulcer about 3 cm, stage II. Another area on left buttock, open and draining pus, Stage I.

## 2014-08-12 NOTE — ED Notes (Signed)
[facility] called for transport back home.

## 2014-08-12 NOTE — ED Notes (Signed)
Pt nurse from home is present and stated that pt has not been "normal" since Wednesday. Nurse from home states that pt being hard to arouse and twitching is not her normal, she is usually easy to arouse, A/OX4.

## 2014-08-12 NOTE — ED Notes (Signed)
MD at bedside. 
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
## 2014-08-17 LAB — CULTURE, BLOOD (ROUTINE X 2)
Culture: NO GROWTH
Culture: NO GROWTH

## 2014-08-25 IMAGING — CT CT HEAD W/O CM
1 of 2 series · 16 of 30 positions shown, 20 images · non-contrast
Comparison: CT scan of May 21, 2013 ; MRI scan of May 31, 2013.

CLINICAL DATA: Altered mental status.

EXAM:
CT HEAD WITHOUT CONTRAST
TECHNIQUE: Contiguous axial images were obtained from the base of the skull
through the vertex without intravenous contrast.

[Series 2: headseq 4.8 h37s · axial · 0.43mm/px · z∈[+76,+239]mm · 16 of 36 slices shown, 20 images]
[im 2/36  brain]
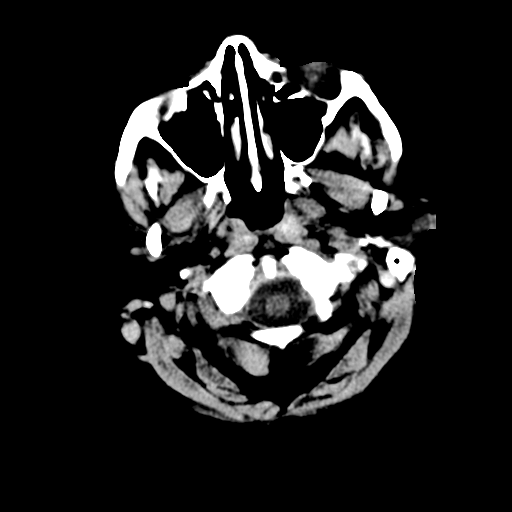
[im 2/36  bone]
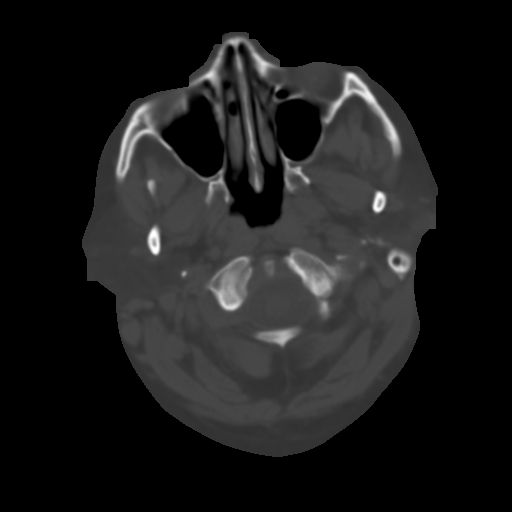
[im 4/36  brain]
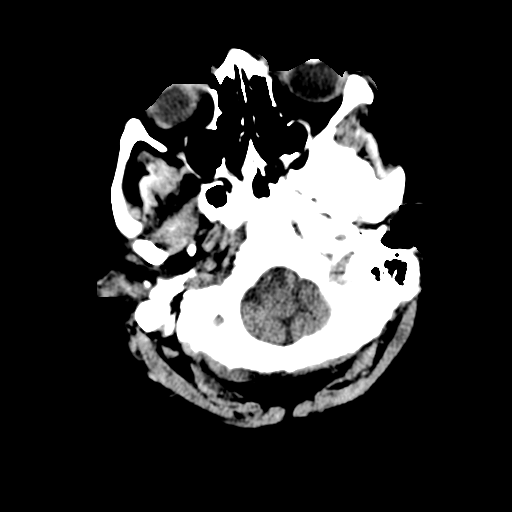
[im 7/36  brain]
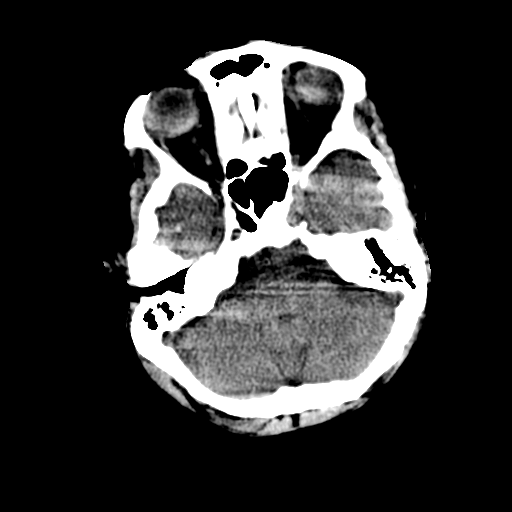
[im 9/36  brain]
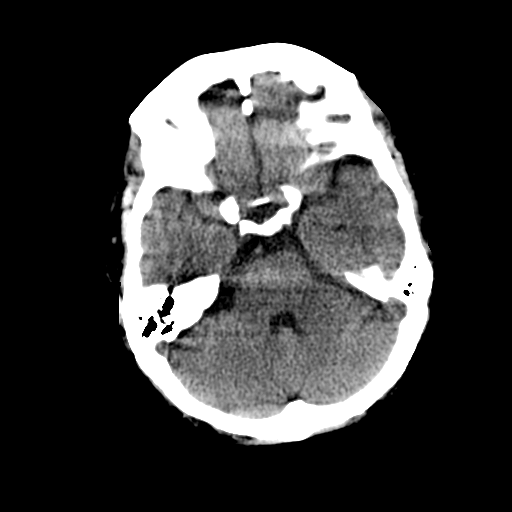
[im 11/36  brain]
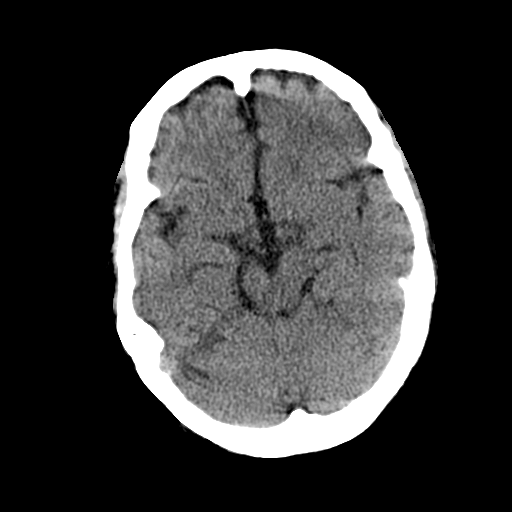
[im 11/36  bone]
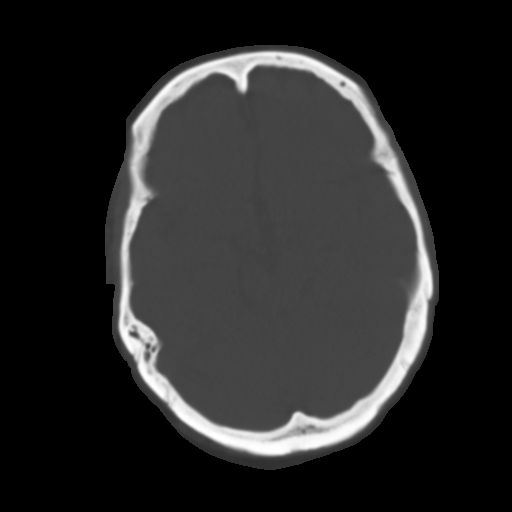
[im 12/36  brain]
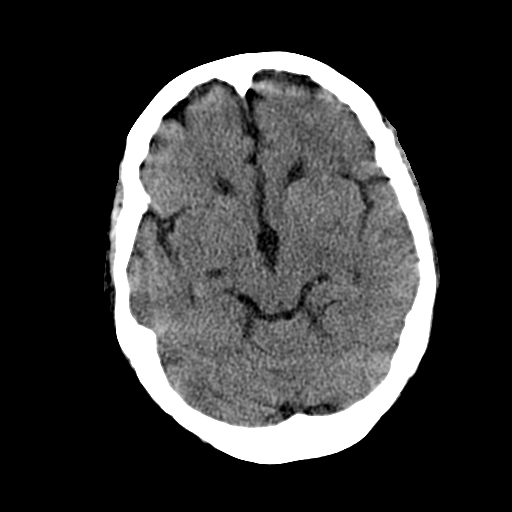
[im 16/36  brain]
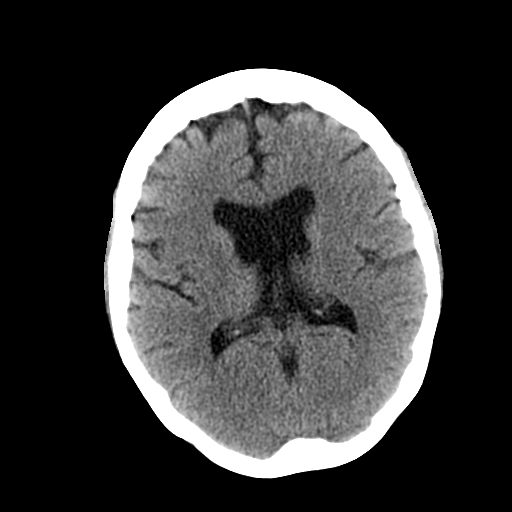
[im 17/36  brain]
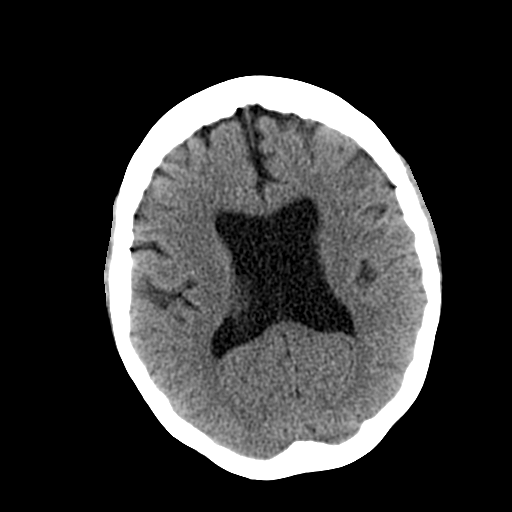
[im 19/36  brain]
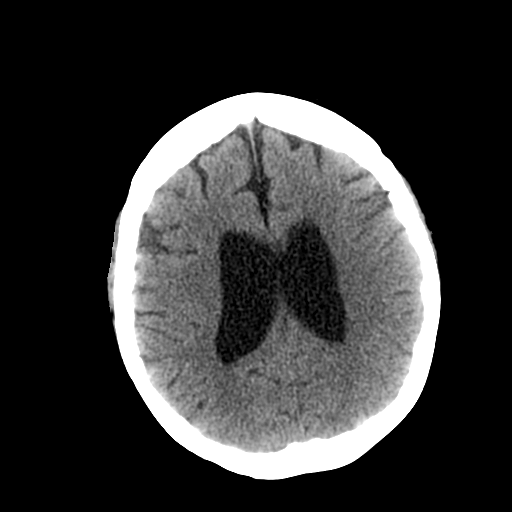
[im 19/36  bone]
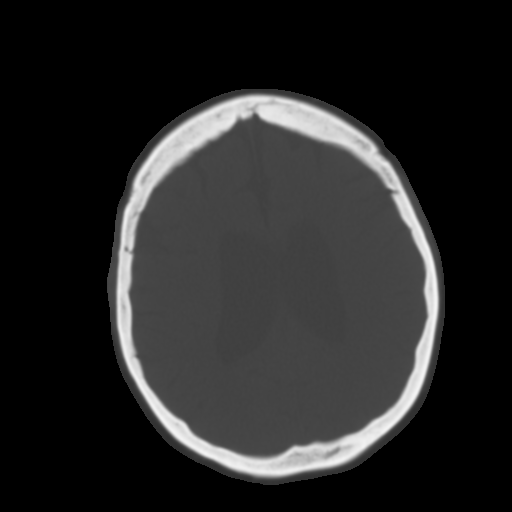
[im 21/36  brain]
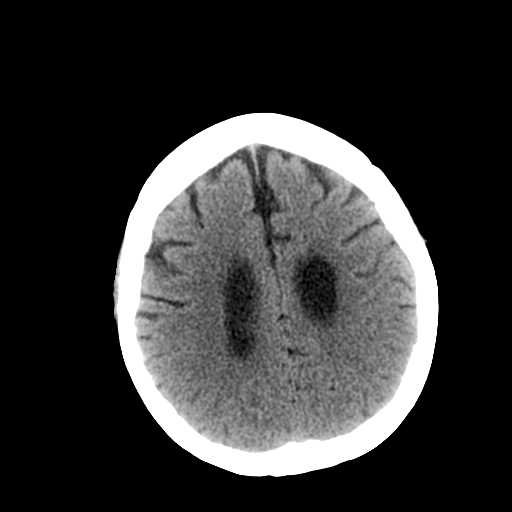
[im 24/36  brain]
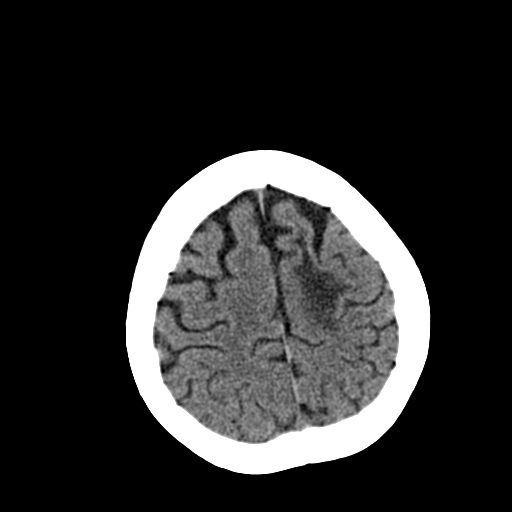
[im 26/36  brain]
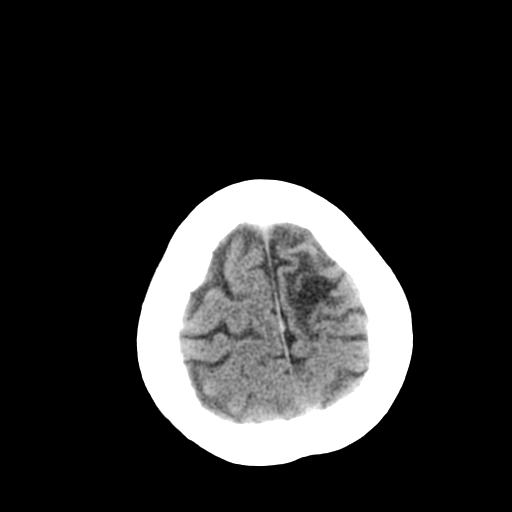
[im 27/36  brain]
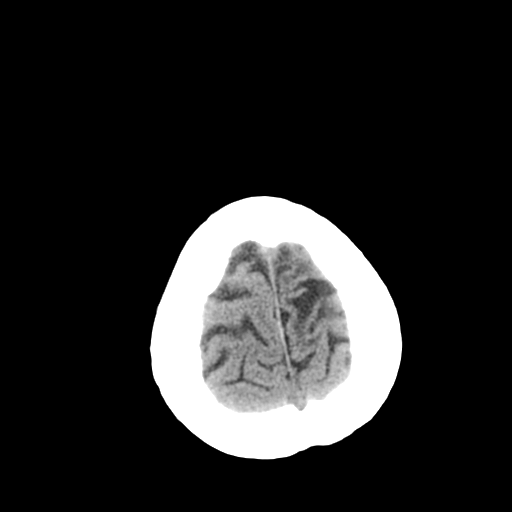
[im 27/36  bone]
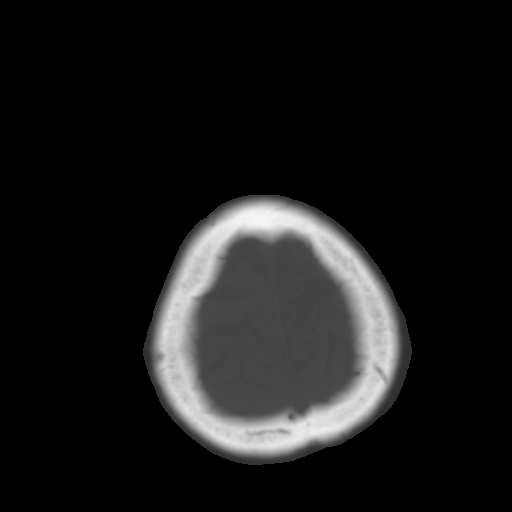
[im 29/36  brain]
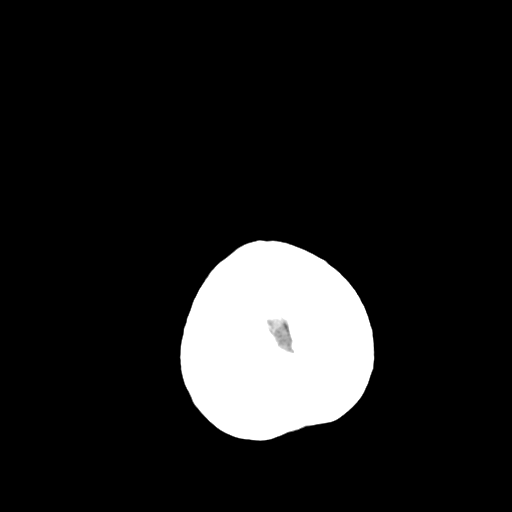
[im 32/36  brain]
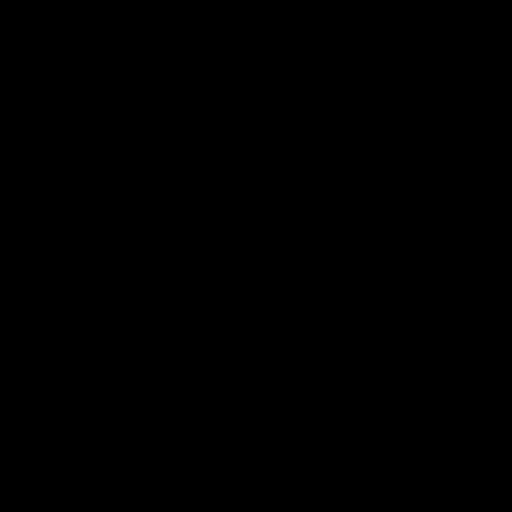
[im 34/36  brain]
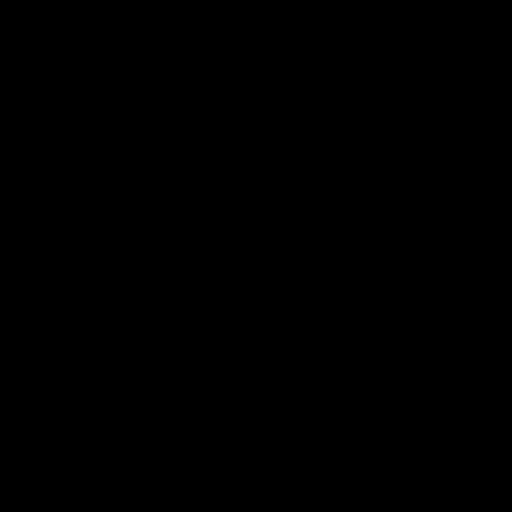

[16 of 30 positions shown; findings below may reference images not displayed]

FINDINGS: Bony calvarium appears intact. Mild diffuse cortical atrophy is
noted. Left frontal encephalomalacia is unchanged consistent with
old infarction. No mass effect or midline shift is noted.
Ventricular size is within normal limits. There is no evidence of
mass lesion, hemorrhage or acute infarction.
IMPRESSION: Mild diffuse cortical atrophy. Old left frontal infarction. No acute
intracranial abnormality seen.

## 2014-08-26 ENCOUNTER — Encounter (HOSPITAL_COMMUNITY): Payer: Self-pay

## 2014-08-26 ENCOUNTER — Inpatient Hospital Stay (HOSPITAL_COMMUNITY)
Admission: EM | Admit: 2014-08-26 | Discharge: 2014-08-29 | DRG: 689 | Disposition: A | Discharge status: Home / Self Care | Payer: Medicaid Other | Attending: Internal Medicine | Admitting: Internal Medicine

## 2014-08-26 ENCOUNTER — Emergency Department (HOSPITAL_COMMUNITY): Payer: Medicaid Other

## 2014-08-26 DIAGNOSIS — L89323 Pressure ulcer of left buttock, stage 3: Secondary | ICD-10-CM | POA: Diagnosis not present

## 2014-08-26 DIAGNOSIS — R410 Disorientation, unspecified: Secondary | ICD-10-CM

## 2014-08-26 DIAGNOSIS — Z833 Family history of diabetes mellitus: Secondary | ICD-10-CM

## 2014-08-26 DIAGNOSIS — N289 Disorder of kidney and ureter, unspecified: Secondary | ICD-10-CM

## 2014-08-26 DIAGNOSIS — E860 Dehydration: Secondary | ICD-10-CM | POA: Diagnosis present

## 2014-08-26 DIAGNOSIS — G8220 Paraplegia, unspecified: Secondary | ICD-10-CM | POA: Diagnosis present

## 2014-08-26 DIAGNOSIS — N179 Acute kidney failure, unspecified: Secondary | ICD-10-CM | POA: Diagnosis not present

## 2014-08-26 DIAGNOSIS — R4182 Altered mental status, unspecified: Secondary | ICD-10-CM

## 2014-08-26 DIAGNOSIS — R110 Nausea: Secondary | ICD-10-CM | POA: Diagnosis not present

## 2014-08-26 DIAGNOSIS — E876 Hypokalemia: Secondary | ICD-10-CM | POA: Diagnosis present

## 2014-08-26 DIAGNOSIS — N390 Urinary tract infection, site not specified: Secondary | ICD-10-CM | POA: Diagnosis not present

## 2014-08-26 DIAGNOSIS — Z8249 Family history of ischemic heart disease and other diseases of the circulatory system: Secondary | ICD-10-CM | POA: Diagnosis not present

## 2014-08-26 DIAGNOSIS — I10 Essential (primary) hypertension: Secondary | ICD-10-CM | POA: Diagnosis present

## 2014-08-26 DIAGNOSIS — L89309 Pressure ulcer of unspecified buttock, unspecified stage: Secondary | ICD-10-CM | POA: Diagnosis present

## 2014-08-26 DIAGNOSIS — Z66 Do not resuscitate: Secondary | ICD-10-CM | POA: Diagnosis present

## 2014-08-26 DIAGNOSIS — Z803 Family history of malignant neoplasm of breast: Secondary | ICD-10-CM | POA: Diagnosis not present

## 2014-08-26 LAB — CBC WITH DIFFERENTIAL/PLATELET
BASOS ABS: 0.1 10*3/uL (ref 0.0–0.1)
BASOS PCT: 1 % (ref 0–1)
EOS PCT: 2 % (ref 0–5)
Eosinophils Absolute: 0.1 10*3/uL (ref 0.0–0.7)
HEMATOCRIT: 34.5 % — AB (ref 36.0–46.0)
Hemoglobin: 11.6 g/dL — ABNORMAL LOW (ref 12.0–15.0)
LYMPHS ABS: 2 10*3/uL (ref 0.7–4.0)
LYMPHS PCT: 27 % (ref 12–46)
MCH: 29 pg (ref 26.0–34.0)
MCHC: 33.6 g/dL (ref 30.0–36.0)
MCV: 86.3 fL (ref 78.0–100.0)
MONO ABS: 0.3 10*3/uL (ref 0.1–1.0)
Monocytes Relative: 4 % (ref 3–12)
Neutro Abs: 5 10*3/uL (ref 1.7–7.7)
Neutrophils Relative %: 66 % (ref 43–77)
Platelets: 302 10*3/uL (ref 150–400)
RBC: 4 MIL/uL (ref 3.87–5.11)
RDW: 15 % (ref 11.5–15.5)
WBC: 7.5 10*3/uL (ref 4.0–10.5)

## 2014-08-26 LAB — BASIC METABOLIC PANEL
Anion gap: 7 (ref 5–15)
BUN: 14 mg/dL (ref 6–23)
CHLORIDE: 110 mmol/L (ref 96–112)
CO2: 21 mmol/L (ref 19–32)
CREATININE: 1.74 mg/dL — AB (ref 0.50–1.10)
Calcium: 8.5 mg/dL (ref 8.4–10.5)
GFR calc non Af Amer: 34 mL/min — ABNORMAL LOW (ref >90)
GFR, EST AFRICAN AMERICAN: 39 mL/min — AB (ref >90)
Glucose, Bld: 122 mg/dL — ABNORMAL HIGH (ref 70–99)
Potassium: 3.4 mmol/L — ABNORMAL LOW (ref 3.5–5.1)
Sodium: 138 mmol/L (ref 135–145)

## 2014-08-26 LAB — URINALYSIS, ROUTINE W REFLEX MICROSCOPIC
Bilirubin Urine: NEGATIVE
Glucose, UA: 250 mg/dL — AB
Ketones, ur: NEGATIVE mg/dL
NITRITE: NEGATIVE
PH: 6 (ref 5.0–8.0)
Protein, ur: >300 mg/dL — AB
SPECIFIC GRAVITY, URINE: 1.025 (ref 1.005–1.030)
Urobilinogen, UA: 0.2 mg/dL (ref 0.0–1.0)

## 2014-08-26 LAB — MRSA PCR SCREENING: MRSA by PCR: NEGATIVE

## 2014-08-26 LAB — LACTIC ACID, PLASMA
LACTIC ACID, VENOUS: 1.2 mmol/L (ref 0.5–2.0)
Lactic Acid, Venous: 1.3 mmol/L (ref 0.5–2.0)

## 2014-08-26 LAB — URINE MICROSCOPIC-ADD ON

## 2014-08-26 MED ORDER — ONDANSETRON HCL 4 MG/2ML IJ SOLN: 4.0000 mg | Freq: Four times a day (QID) | INTRAMUSCULAR | Status: DC | PRN

## 2014-08-26 MED ORDER — PANTOPRAZOLE SODIUM 40 MG PO TBEC
40.0000 mg | DELAYED_RELEASE_TABLET | Freq: Every day | ORAL | Status: DC
  Administered 2014-08-27 – 2014-08-29 (×3): 40 mg via ORAL
  Filled 2014-08-26 (×3): qty 1

## 2014-08-26 MED ORDER — ONDANSETRON HCL 4 MG PO TABS: 4.0000 mg | ORAL_TABLET | Freq: Four times a day (QID) | ORAL | Status: DC | PRN

## 2014-08-26 MED ORDER — METOPROLOL TARTRATE 25 MG PO TABS
25.0000 mg | ORAL_TABLET | Freq: Two times a day (BID) | ORAL | Status: DC
  Administered 2014-08-26 – 2014-08-29 (×6): 25 mg via ORAL
  Filled 2014-08-26 (×6): qty 1

## 2014-08-26 MED ORDER — BACLOFEN 10 MG PO TABS
20.0000 mg | ORAL_TABLET | Freq: Four times a day (QID) | ORAL | Status: DC
  Administered 2014-08-26 – 2014-08-29 (×10): 20 mg via ORAL
  Filled 2014-08-26 (×11): qty 2

## 2014-08-26 MED ORDER — DOCUSATE SODIUM 100 MG PO CAPS
100.0000 mg | ORAL_CAPSULE | Freq: Every day | ORAL | Status: DC
  Administered 2014-08-27 – 2014-08-28 (×2): 100 mg via ORAL
  Filled 2014-08-26 (×3): qty 1

## 2014-08-26 MED ORDER — POTASSIUM CHLORIDE 10 MEQ/100ML IV SOLN: 10.0000 meq | Freq: Once | INTRAVENOUS | Status: DC

## 2014-08-26 MED ORDER — ALUM & MAG HYDROXIDE-SIMETH 200-200-20 MG/5ML PO SUSP: 30.0000 mL | Freq: Four times a day (QID) | ORAL | Status: DC | PRN

## 2014-08-26 MED ORDER — ENOXAPARIN SODIUM 40 MG/0.4ML ~~LOC~~ SOLN
40.0000 mg | SUBCUTANEOUS | Status: DC
  Administered 2014-08-26 – 2014-08-28 (×3): 40 mg via SUBCUTANEOUS
  Filled 2014-08-26 (×3): qty 0.4

## 2014-08-26 MED ORDER — ACETAMINOPHEN 325 MG PO TABS: 650.0000 mg | ORAL_TABLET | Freq: Four times a day (QID) | ORAL | Status: DC | PRN

## 2014-08-26 MED ORDER — LEVETIRACETAM 500 MG PO TABS
1000.0000 mg | ORAL_TABLET | Freq: Three times a day (TID) | ORAL | Status: DC
  Administered 2014-08-26 – 2014-08-29 (×8): 1000 mg via ORAL
  Filled 2014-08-26 (×9): qty 2

## 2014-08-26 MED ORDER — FESOTERODINE FUMARATE ER 8 MG PO TB24
8.0000 mg | ORAL_TABLET | Freq: Every day | ORAL | Status: DC
  Administered 2014-08-27 – 2014-08-29 (×3): 8 mg via ORAL
  Filled 2014-08-26 (×5): qty 1

## 2014-08-26 MED ORDER — POTASSIUM CHLORIDE 10 MEQ/100ML IV SOLN
10.0000 meq | Freq: Once | INTRAVENOUS | Status: AC
  Administered 2014-08-26: 10 meq via INTRAVENOUS
  Filled 2014-08-26: qty 100

## 2014-08-26 MED ORDER — CEFTRIAXONE SODIUM IN DEXTROSE 20 MG/ML IV SOLN
1.0000 g | INTRAVENOUS | Status: DC
  Filled 2014-08-26 (×2): qty 50

## 2014-08-26 MED ORDER — SODIUM CHLORIDE 0.9 % IV BOLUS (SEPSIS)
500.0000 mL | Freq: Once | INTRAVENOUS | Status: AC
  Administered 2014-08-26: 500 mL via INTRAVENOUS

## 2014-08-26 MED ORDER — GABAPENTIN 300 MG PO CAPS
300.0000 mg | ORAL_CAPSULE | Freq: Three times a day (TID) | ORAL | Status: DC
  Administered 2014-08-26 – 2014-08-29 (×8): 300 mg via ORAL
  Filled 2014-08-26 (×8): qty 1

## 2014-08-26 MED ORDER — SODIUM CHLORIDE 0.9 % IV SOLN
INTRAVENOUS | Status: DC
  Administered 2014-08-26 – 2014-08-27 (×2): via INTRAVENOUS
  Administered 2014-08-27: 1 mL via INTRAVENOUS
  Administered 2014-08-27 – 2014-08-29 (×4): via INTRAVENOUS

## 2014-08-26 MED ORDER — ACETAMINOPHEN 650 MG RE SUPP: 650.0000 mg | Freq: Four times a day (QID) | RECTAL | Status: DC | PRN

## 2014-08-26 MED ORDER — AMLODIPINE BESYLATE 5 MG PO TABS
10.0000 mg | ORAL_TABLET | Freq: Every day | ORAL | Status: DC
Start: 2014-08-27 — End: 2014-08-29
  Administered 2014-08-27 – 2014-08-29 (×3): 10 mg via ORAL
  Filled 2014-08-26 (×3): qty 2

## 2014-08-26 MED ORDER — POTASSIUM CHLORIDE CRYS ER 20 MEQ PO TBCR
20.0000 meq | EXTENDED_RELEASE_TABLET | Freq: Every day | ORAL | Status: DC
  Administered 2014-08-27 – 2014-08-29 (×3): 20 meq via ORAL
  Filled 2014-08-26 (×3): qty 1

## 2014-08-26 MED ORDER — CLONIDINE HCL 0.2 MG PO TABS
0.2000 mg | ORAL_TABLET | Freq: Three times a day (TID) | ORAL | Status: DC
  Administered 2014-08-26 – 2014-08-29 (×8): 0.2 mg via ORAL
  Filled 2014-08-26 (×8): qty 1

## 2014-08-26 MED ORDER — DEXTROSE 5 % IV SOLN
1.0000 g | Freq: Once | INTRAVENOUS | Status: AC
  Administered 2014-08-26: 1 g via INTRAVENOUS
  Filled 2014-08-26: qty 10

## 2014-08-26 NOTE — ED Notes (Signed)
Pt has pressure ulcer 1.5 cm left gluteal, staged by hospitalist. Another pressure ulcer 1.5 cm left sacral area, staged by hospitalist.

## 2014-08-26 NOTE — H&P (Signed)
History and Physical  [name] [id number] DOB: 12-May-1966 DOA: 08/26/2014  Referring physician: Dr Thurnell Garbe, ED physician PCP: [name], MD   Chief Complaint: Abdominal pain, nausea  HPI: [name] is a 49 y.o. 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 The patient complains of abdominal pain, nausea and decreased appetite over the past month that has been worsening. The patient was seen approximately 2 weeks ago at [facility] emergency department due to hypertension.  At that time, the patient was treated with IV fluids, noted to have elevated creatinine of 1.68, and sent home at the patient's request. Was also noted that she had a decubitus ulcer on her buttocks. The patient was advised to follow-up it was a long wound care center. Per the chart, the patient's family states that she has minimal appetite over the past week or so has not been eating unless her family forces her. No palliating or provoking factors.   Review of Systems:   Pt complains of chills, nausea, abdominal pain, lower pelvic pain.  Pt denies any fevers, shortness of breath, chest pain, palpitations, rashes, bruises, headache, dizziness.  Review of systems are otherwise negative  Past Medical History  Diagnosis Date  . Paraplegia (lower)   . Seizure disorder   . High blood pressure   . Suprapubic catheter   . Cancer     uterine  . Seizures   . Abnormal uterine bleeding (AUB) 06/15/2014   Past Surgical History  Procedure Laterality Date  . Back surgery     Social History:  reports that she has never smoked. She has never used smokeless tobacco. She reports that she does not drink alcohol or use illicit drugs. Patient lives at home   Allergies  Allergen Reactions  . Quinine Derivatives Other (See Comments)    Alters mental status    Family History  Problem Relation Age of Onset  . Cancer  Mother   . Hypertension Mother   . Cancer Sister     breast and then spread everywhere.  . Diabetes Paternal Grandmother   . Hypertension Paternal Grandmother       Prior to Admission medications   Medication Sig Start Date End Date Taking? Authorizing Provider  amLODipine (NORVASC) 10 MG tablet Take 1 tablet (10 mg total) by mouth daily. 05/10/14  Yes Donne Hazel, MD  baclofen (LIORESAL) 20 MG tablet Take 1 tablet (20 mg total) by mouth 4 (four) times daily. Patient taking differently: Take 40 mg by mouth 4 (four) times daily.  06/14/14  Yes Melvenia Beam, MD  cloNIDine (CATAPRES) 0.2 MG tablet Take 1 tablet (0.2 mg total) by mouth 3 (three) times daily. 05/10/14  Yes Donne Hazel, MD  docusate sodium (COLACE) 100 MG capsule Take 100 mg by mouth at bedtime.   Yes Historical Provider, MD  fesoterodine (TOVIAZ) 8 MG TB24 tablet Take 8 mg by mouth daily.   Yes Historical Provider, MD  furosemide (LASIX) 40 MG tablet Take 40 mg by mouth daily.   Yes Historical Provider, MD  gabapentin (NEURONTIN) 300 MG capsule Take 300 mg by mouth 3 (three) times daily.    Yes Historical Provider, MD  levETIRAcetam (KEPPRA) 1000 MG tablet Take 1 tablet (1,000 mg total) by mouth 2 (two) times daily. Patient taking differently: Take 1,000 mg by mouth 3 (three) times daily.  05/10/14  Yes Donne Hazel, MD  metoprolol tartrate (LOPRESSOR) 25 MG  tablet Take 1 tablet (25 mg total) by mouth 2 (two) times daily. 05/10/14  Yes Donne Hazel, MD  Multiple Vitamins-Minerals (MULTIVITAMIN ADULT PO) Take by mouth daily.   Yes Historical Provider, MD  pantoprazole (PROTONIX) 40 MG tablet Take 1 tablet (40 mg total) by mouth daily at 12 noon. 05/10/14  Yes Donne Hazel, MD  potassium chloride SA (K-DUR,KLOR-CON) 20 MEQ tablet Take 20 mEq by mouth daily.   Yes Historical Provider, MD    Physical Exam: BP 137/107 mmHg  Pulse 117  Temp(Src) 97.4 F (36.3 C) (Rectal)  Resp 20  SpO2 99%  General: Middle-aged  black female. Awake and alert and oriented x3. No acute cardiopulmonary distress.  Eyes: Pupils equal, round, reactive to light. Extraocular muscles are intact. Sclerae anicteric and noninjected.  ENT:  Moist mucosal membranes. No mucosal lesions. Teeth in poor repair  Neck: Neck supple without lymphadenopathy. No carotid bruits. No masses palpated.  Cardiovascular: Regular rate with normal S1-S2 sounds. No murmurs, rubs, gallops auscultated. No JVD.  Respiratory: Good respiratory effort with no wheezes, rales, rhonchi. Lungs clear to auscultation bilaterally.  Abdomen: Soft, tender suprapubically, nondistended. Active bowel sounds. No masses or hepatosplenomegaly  Skin: Dry, warm to touch. 2+ dorsalis pedis and radial pulses. There is a stage III decubitus ulcer on the left parasacral/coccygeal area that is approximately 1.5 cm in diameter. There is a left lateral stage II decubitus ulcer that is 1.57 m in diameter on the gluteal fold. Musculoskeletal: No calf or leg pain. All major joints not erythematous nontender.  Psychiatric: Intact judgment and insight.  Neurologic: No focal neurological deficits. Cranial nerves II through XII are grossly intact.           Labs on Admission:  Basic Metabolic Panel:  Recent Labs Lab 08/26/14 1445  NA 138  K 3.4*  CL 110  CO2 21  GLUCOSE 122*  BUN 14  CREATININE 1.74*  CALCIUM 8.5   Liver Function Tests: No results for input(s): AST, ALT, ALKPHOS, BILITOT, PROT, ALBUMIN in the last 168 hours. No results for input(s): LIPASE, AMYLASE in the last 168 hours. No results for input(s): AMMONIA in the last 168 hours. CBC:  Recent Labs Lab 08/26/14 1445  WBC 7.5  NEUTROABS 5.0  HGB 11.6*  HCT 34.5*  MCV 86.3  PLT 302   Cardiac Enzymes: No results for input(s): CKTOTAL, CKMB, CKMBINDEX, TROPONINI in the last 168 hours.  BNP (last 3 results) No results for input(s): BNP in the last 8760 hours.  ProBNP (last 3 results) No results for  input(s): PROBNP in the last 8760 hours.  CBG: No results for input(s): GLUCAP in the last 168 hours.  Radiological Exams on Admission: Dg Chest 1 View  08/26/2014   CLINICAL DATA:  Suspect urinary tract infection, no cardiopulmonary symptoms, history of altered mental status.  EXAM: CHEST  1 VIEW  COMPARISON:  Portable chest x-ray of August 12, 2014 and May 04, 2014  FINDINGS: The lungs are adequately inflated and clear. The cardiac silhouette is mildly enlarged. The pulmonary vascularity is normal. There is no pleural effusion. There is stable minimal blunting of the left lateral costophrenic angle. Harrington rod stabilization appliance is in the thoracic spine appear intact.  IMPRESSION: Mild stable enlargement of the cardiac silhouette. There is no active cardiopulmonary disease.   Electronically Signed   By: David  Martinique   On: 08/26/2014 15:35     Assessment/Plan Present on Admission:  . UTI (lower urinary tract infection) .  Decubitus ulcer of buttock . Hypokalemia . Acute renal failure  #1 UTI #2 hypokalemia #3 acute renal failure #4 dehydration #5 decubitus ulcer of buttock #6 hypertension #7 paraplegia   Admit to Dr. Legrand Rams Continue Rocephin IV hydration for renal failure Consult wound care for her decubitus ulcers Frequent rolling to prevent exacerbation of the decubitus ulcers   DVT prophylaxis: Lovenox  Consultants: None  Code Status: DO NOT RESUSCITATE  Family Communication: None   Disposition Plan: Home following resolution of renal injury  Time spent: 70 minutes was spent with face-to-face time with patient with at least 50% with counseling and coordination of care  Truett Mainland, DO [facility] Pager [phone]

## 2014-08-26 NOTE — ED Provider Notes (Signed)
CSN: [id number]     Arrival date & time 08/26/14  1422 History   First MD Initiated Contact with Patient 08/26/14 1430     Chief Complaint  Patient presents with  . Urinary Tract Infection      Patient is a 49 y.o. female presenting with urinary tract infection. The history is provided by the patient, the EMS personnel and a caregiver.  Urinary Tract Infection  Pt was seen at 1430. Per EMS, Home Health RN report and pt: Home Health RN called EMS to bring pt to the ED for evaluation of "possible UTI." Pt has not been acting per her baseline for the past several days to 1-2 weeks. Family states pt will "just lay there" and "not eat or drink unless we force her." Family denies pt is "confused," just "less interactive. Pt herself will not speak to me but will nod her head yes and no to questions. Pt denies abd pain, no CP/SOB, no cough. No seizures, no fevers, no N/V/D. Pt was evaluated in the ED 2 weeks ago for same, and was d/c back to home "because she was acting better" per family.    Past Medical History  Diagnosis Date  . Paraplegia (lower)   . Seizure disorder   . High blood pressure   . Suprapubic catheter   . Cancer     uterine  . Seizures   . Abnormal uterine bleeding (AUB) 06/15/2014   Past Surgical History  Procedure Laterality Date  . Back surgery     Family History  Problem Relation Age of Onset  . Cancer Mother   . Hypertension Mother   . Cancer Sister     breast and then spread everywhere.  . Diabetes Paternal Grandmother   . Hypertension Paternal Grandmother    History  Substance Use Topics  . Smoking status: Never Smoker   . Smokeless tobacco: Never Used  . Alcohol Use: No     Comment: every once in a while   OB History    Gravida Para Term Preterm AB TAB SAB Ectopic Multiple Living   4 3   1  1   3      Review of Systems  Unable to perform ROS: Mental status change      Allergies  Quinine derivatives  Home Medications   Prior to Admission  medications   Medication Sig Start Date End Date Taking? Authorizing Provider  amLODipine (NORVASC) 10 MG tablet Take 1 tablet (10 mg total) by mouth daily. 05/10/14   Donne Hazel, MD  baclofen (LIORESAL) 20 MG tablet Take 1 tablet (20 mg total) by mouth 4 (four) times daily. Patient taking differently: Take 40 mg by mouth 4 (four) times daily.  06/14/14   Melvenia Beam, MD  cloNIDine (CATAPRES) 0.2 MG tablet Take 1 tablet (0.2 mg total) by mouth 3 (three) times daily. 05/10/14   Donne Hazel, MD  docusate sodium (COLACE) 100 MG capsule Take 100 mg by mouth at bedtime.    Historical Provider, MD  fesoterodine (TOVIAZ) 8 MG TB24 tablet Take 8 mg by mouth daily.    Historical Provider, MD  furosemide (LASIX) 40 MG tablet Take 40 mg by mouth daily.    Historical Provider, MD  gabapentin (NEURONTIN) 300 MG capsule Take 300 mg by mouth 3 (three) times daily.     Historical Provider, MD  levETIRAcetam (KEPPRA) 1000 MG tablet Take 1 tablet (1,000 mg total) by mouth 2 (two) times daily. Patient taking differently:  Take 1,000 mg by mouth 3 (three) times daily.  05/10/14   Donne Hazel, MD  metoprolol tartrate (LOPRESSOR) 25 MG tablet Take 1 tablet (25 mg total) by mouth 2 (two) times daily. 05/10/14   Donne Hazel, MD  Multiple Vitamins-Minerals (MULTIVITAMIN ADULT PO) Take by mouth daily.    Historical Provider, MD  pantoprazole (PROTONIX) 40 MG tablet Take 1 tablet (40 mg total) by mouth daily at 12 noon. 05/10/14   Donne Hazel, MD  potassium chloride SA (K-DUR,KLOR-CON) 20 MEQ tablet Take 20 mEq by mouth daily.    Historical Provider, MD   BP 139/99 mmHg  Pulse 100  Temp(Src) 98 F (36.7 C) (Oral)  Resp 20  SpO2 100%   Filed Vitals:   08/26/14 1630 08/26/14 1645 08/26/14 1700 08/26/14 1715  BP: 92/69 90/70 93/70  145/90  Pulse: 76 69 66 119  Temp:      TempSrc:      Resp:      SpO2: 95% 96% 96% 95%      Physical Exam  1435: Physical examination:  Nursing notes reviewed;  Vital signs and O2 SAT reviewed;  Constitutional: Well developed, Well nourished, In no acute distress; Head:  Normocephalic, atraumatic; Eyes: EOMI, PERRL, No scleral icterus; ENMT: Mouth and pharynx normal, Mucous membranes dry; Neck: Supple, Full range of motion, No lymphadenopathy; Cardiovascular: Tachycardic rate and rhythm, No gallop; Respiratory: Breath sounds clear & equal bilaterally, No wheezes. Normal respiratory effort/excursion; Chest: Nontender, Movement normal; Abdomen: Soft, Nontender, Nondistended, Normal bowel sounds; Genitourinary: No CVA tenderness; Extremities: Pulses normal, No deformity, No edema, No calf edema or asymmetry.; Neuro: Awake, alert. Eyes open. Will not speak to me, but will nod her head yes and no to questions. No facial droop. +bilat LE paraplegia per hx, otherwise moves UE's spontaneously..; Skin: Color normal, Warm, Dry.   ED Course  Procedures     EKG Interpretation None      MDM  MDM Reviewed: previous chart, nursing note and vitals Reviewed previous: labs Interpretation: labs and x-ray     Results for orders placed or performed during the hospital encounter of 08/26/14  Urinalysis, Routine w reflex microscopic  Result Value Ref Range   Color, Urine YELLOW YELLOW   APPearance CLOUDY (A) CLEAR   Specific Gravity, Urine 1.025 1.005 - 1.030   pH 6.0 5.0 - 8.0   Glucose, UA 250 (A) NEGATIVE mg/dL   Hgb urine dipstick MODERATE (A) NEGATIVE   Bilirubin Urine NEGATIVE NEGATIVE   Ketones, ur NEGATIVE NEGATIVE mg/dL   Protein, ur >300 (A) NEGATIVE mg/dL   Urobilinogen, UA 0.2 0.0 - 1.0 mg/dL   Nitrite NEGATIVE NEGATIVE   Leukocytes, UA SMALL (A) NEGATIVE  Basic metabolic panel  Result Value Ref Range   Sodium 138 135 - 145 mmol/L   Potassium 3.4 (L) 3.5 - 5.1 mmol/L   Chloride 110 96 - 112 mmol/L   CO2 21 19 - 32 mmol/L   Glucose, Bld 122 (H) 70 - 99 mg/dL   BUN 14 6 - 23 mg/dL   Creatinine, Ser 1.74 (H) 0.50 - 1.10 mg/dL   Calcium 8.5 8.4  - 10.5 mg/dL   GFR calc non Af Amer 34 (L) >90 mL/min   GFR calc Af Amer 39 (L) >90 mL/min   Anion gap 7 5 - 15  CBC with Differential  Result Value Ref Range   WBC 7.5 4.0 - 10.5 K/uL   RBC 4.00 3.87 - 5.11 MIL/uL   Hemoglobin  11.6 (L) 12.0 - 15.0 g/dL   HCT 34.5 (L) 36.0 - 46.0 %   MCV 86.3 78.0 - 100.0 fL   MCH 29.0 26.0 - 34.0 pg   MCHC 33.6 30.0 - 36.0 g/dL   RDW 15.0 11.5 - 15.5 %   Platelets 302 150 - 400 K/uL   Neutrophils Relative % 66 43 - 77 %   Neutro Abs 5.0 1.7 - 7.7 K/uL   Lymphocytes Relative 27 12 - 46 %   Lymphs Abs 2.0 0.7 - 4.0 K/uL   Monocytes Relative 4 3 - 12 %   Monocytes Absolute 0.3 0.1 - 1.0 K/uL   Eosinophils Relative 2 0 - 5 %   Eosinophils Absolute 0.1 0.0 - 0.7 K/uL   Basophils Relative 1 0 - 1 %   Basophils Absolute 0.1 0.0 - 0.1 K/uL  Lactic acid, plasma  Result Value Ref Range   Lactic Acid, Venous 1.3 0.5 - 2.0 mmol/L  Urine microscopic-add on  Result Value Ref Range   WBC, UA 21-50 <3 WBC/hpf   RBC / HPF 3-6 <3 RBC/hpf   Bacteria, UA MANY (A) RARE   Dg Chest 1 View 08/26/2014   CLINICAL DATA:  Suspect urinary tract infection, no cardiopulmonary symptoms, history of altered mental status.  EXAM: CHEST  1 VIEW  COMPARISON:  Portable chest x-ray of August 12, 2014 and May 04, 2014  FINDINGS: The lungs are adequately inflated and clear. The cardiac silhouette is mildly enlarged. The pulmonary vascularity is normal. There is no pleural effusion. There is stable minimal blunting of the left lateral costophrenic angle. Harrington rod stabilization appliance is in the thoracic spine appear intact.  IMPRESSION: Mild stable enlargement of the cardiac silhouette. There is no active cardiopulmonary disease.   Electronically Signed   By: David  Martinique   On: 08/26/2014 15:35    Results for NAMINE, ECCLESTON (MRN [id number]) as of 08/26/2014 17:34  Ref. Range 05/10/2014 11:45 06/14/2014 14:03 08/12/2014 14:50 08/26/2014 14:45  BUN Latest Range: 6-24 mg/dL 7 10  17 14   Creatinine Latest Range: 0.50-1.10 mg/dL 1.09 1.12 (H) 1.68 (H) 1.74 (H)    1750:  Potassium repleted IV.  IVF given for tachycardia and mild hypotension as well as slowly elevating BUN/Cr over baseline the past 2 weeks. Urine without elevated WBC's 2 weeks ago, now with 21-50. UC is pending; will dose IV rocephin. Family at bedside feels pt "still isn't acting like she usually does."  Dx and testing d/w pt and family.  Questions answered.  Verb understanding, agreeable to admit.  T/C to [facility] Dr. Nehemiah Settle, case discussed, including:  HPI, pertinent PM/SHx, VS/PE, dx testing, ED course and treatment:  Agreeable to admit, requests he will come to the ED for evaluation.  Francine Graven, DO 08/29/14 2031

## 2014-08-26 NOTE — ED Notes (Signed)
Per EMS, pt sent here by home health nurse for evaluation of possible UTI. Pt has indwelling foley. Pt denies any symptoms.

## 2014-08-27 LAB — IRON AND TIBC
Iron: 49 ug/dL (ref 42–145)
Saturation Ratios: 32 % (ref 20–55)
TIBC: 151 ug/dL — ABNORMAL LOW (ref 250–470)
UIBC: 102 ug/dL — AB (ref 125–400)

## 2014-08-27 LAB — CBC
HCT: 28.4 % — ABNORMAL LOW (ref 36.0–46.0)
Hemoglobin: 9.2 g/dL — ABNORMAL LOW (ref 12.0–15.0)
MCH: 28.5 pg (ref 26.0–34.0)
MCHC: 32.4 g/dL (ref 30.0–36.0)
MCV: 87.9 fL (ref 78.0–100.0)
Platelets: 262 10*3/uL (ref 150–400)
RBC: 3.23 MIL/uL — ABNORMAL LOW (ref 3.87–5.11)
RDW: 15.4 % (ref 11.5–15.5)
WBC: 6.7 10*3/uL (ref 4.0–10.5)

## 2014-08-27 LAB — BASIC METABOLIC PANEL
ANION GAP: 4 — AB (ref 5–15)
BUN: 13 mg/dL (ref 6–23)
CALCIUM: 7.7 mg/dL — AB (ref 8.4–10.5)
CO2: 21 mmol/L (ref 19–32)
Chloride: 112 mmol/L (ref 96–112)
Creatinine, Ser: 1.52 mg/dL — ABNORMAL HIGH (ref 0.50–1.10)
GFR calc Af Amer: 46 mL/min — ABNORMAL LOW (ref >90)
GFR calc non Af Amer: 40 mL/min — ABNORMAL LOW (ref >90)
GLUCOSE: 111 mg/dL — AB (ref 70–99)
POTASSIUM: 3.3 mmol/L — AB (ref 3.5–5.1)
SODIUM: 137 mmol/L (ref 135–145)

## 2014-08-27 LAB — RETICULOCYTES
RBC.: 3.01 MIL/uL — ABNORMAL LOW (ref 3.87–5.11)
RETIC CT PCT: 3.3 % — AB (ref 0.4–3.1)
Retic Count, Absolute: 99.3 10*3/uL (ref 19.0–186.0)

## 2014-08-27 IMAGING — RF DG CYSTOGRAM 3+V
4 series · 4 of 4 positions shown · non-contrast
Comparison: None

FLUOROSCOPY TIME:  0 min 48 seconds

CLINICAL DATA: Non functional suprapubic catheter

EXAM:
CYSTOGRAM
TECHNIQUE: After catheterization of the urinary bladder following sterile
technique the bladder was filled with approximately 20 mL of
Cysto-Hypaque 30% by drip infusion. Serial spot images were obtained
during bladder filling.

[Series 1: run · 1 of 1 slices shown (1 of 4)]
[im 1/1]
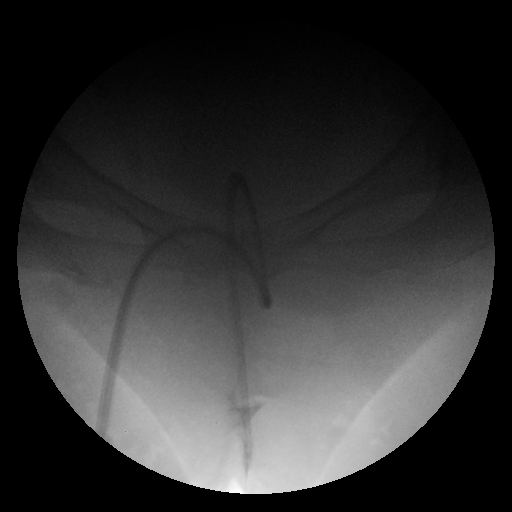

[Series 2: run · 1 of 1 slices shown (2 of 4)]
[im 1/1]
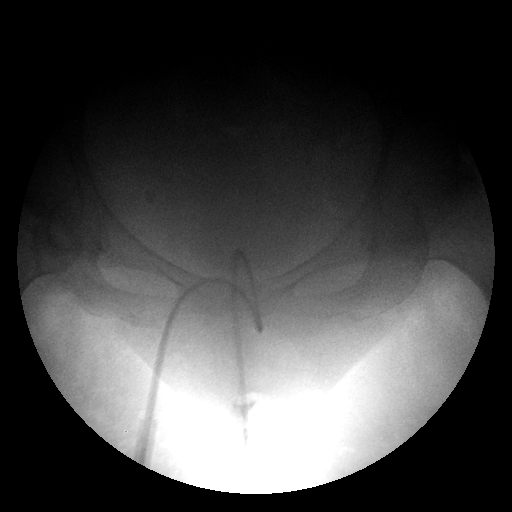

[Series 3: run · 1 of 1 slices shown (3 of 4)]
[im 1/1]
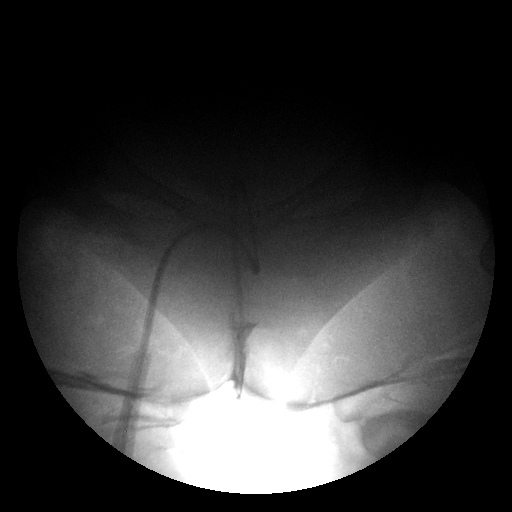

[Series 4: run · 1 of 1 slices shown (4 of 4)]
[im 1/1]
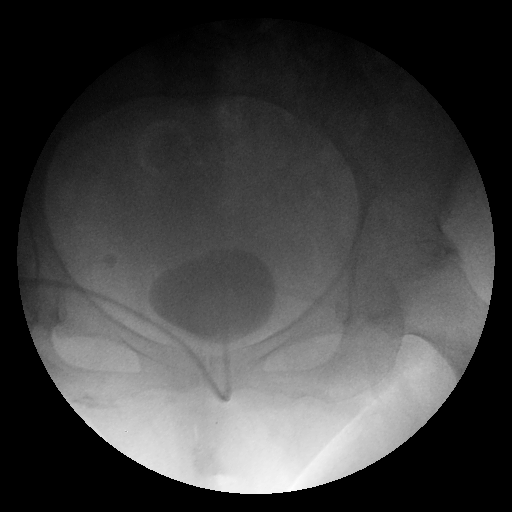

[4 of 4 positions shown; findings below may reference images not displayed]

FINDINGS: Tip of the suprapubic catheter extends low in the pelvis, suspect
below bladder base.

With installation of contrast via gravity drip, contrast opacifies
the perineum.

Visual inspection confirms at the catheter traverses the urinary
bladder and exits the urethra into the vagina.

Findings were discussed with Dr. Kalkidan, who came to the fluoroscopy
suite and repositioned the catheter into the urinary bladder under
fluoroscopic visualization.

Instillation of approximately 20 cc of contrast demonstrates the
catheter now normally positioned within the urinary bladder without
contrast extravasation.
IMPRESSION: Initially the suprapubic catheter extended across the urinary
bladder and exited the urethra into the vagina.

The suprapubic catheter was repositioned into the urinary bladder by
Dr. Kalkidan under fluoroscopy.

## 2014-08-27 MED ORDER — DEXTROSE 5 % IV SOLN
1.0000 g | INTRAVENOUS | Status: DC
  Administered 2014-08-27 – 2014-08-28 (×2): 1 g via INTRAVENOUS
  Filled 2014-08-27 (×3): qty 10

## 2014-08-27 NOTE — Progress Notes (Signed)
Subjective: Patient was admitted yesterday due to abdominal pain and nausea due to UTI. There is a drop in her H/H.  Objective: Vital signs in last 24 hours: Temp:  [97.4 F (36.3 C)-98.1 F (36.7 C)] 98.1 F (36.7 C) (04/02 0521) Pulse Rate:  [66-119] 68 (04/02 0521) Resp:  [18-20] 18 (04/02 0521) BP: (90-155)/(69-107) 120/71 mmHg (04/02 0521) SpO2:  [94 %-100 %] 99 % (04/02 0521) Weight:  [73.437 kg (161 lb 14.4 oz)] 73.437 kg (161 lb 14.4 oz) (04/01 1930) Weight change:  Last BM Date: 08/26/14  Intake/Output from previous day: 04/01 0701 - 04/02 0700 In: 1929.2 [P.O.:600; I.V.:1329.2] Out: 500 [Urine:500]  PHYSICAL EXAM General appearance: alert and no distress Resp: clear to auscultation bilaterally Cardio: S1, S2 normal GI: soft, non-tender; bowel sounds normal; no masses,  no organomegaly Extremities: paraplegic  Lab Results:  Results for orders placed or performed during the hospital encounter of 08/26/14 (from the past 48 hour(s))  Basic metabolic panel     Status: Abnormal   Collection Time: 08/26/14  2:45 PM  Result Value Ref Range   Sodium 138 135 - 145 mmol/L   Potassium 3.4 (L) 3.5 - 5.1 mmol/L   Chloride 110 96 - 112 mmol/L   CO2 21 19 - 32 mmol/L   Glucose, Bld 122 (H) 70 - 99 mg/dL   BUN 14 6 - 23 mg/dL   Creatinine, Ser 1.74 (H) 0.50 - 1.10 mg/dL   Calcium 8.5 8.4 - 10.5 mg/dL   GFR calc non Af Amer 34 (L) >90 mL/min   GFR calc Af Amer 39 (L) >90 mL/min    Comment: (NOTE) The eGFR has been calculated using the CKD EPI equation. This calculation has not been validated in all clinical situations. eGFR's persistently <90 mL/min signify possible Chronic Kidney Disease.    Anion gap 7 5 - 15  CBC with Differential     Status: Abnormal   Collection Time: 08/26/14  2:45 PM  Result Value Ref Range   WBC 7.5 4.0 - 10.5 K/uL   RBC 4.00 3.87 - 5.11 MIL/uL   Hemoglobin 11.6 (L) 12.0 - 15.0 g/dL   HCT 34.5 (L) 36.0 - 46.0 %   MCV 86.3 78.0 - 100.0 fL   MCH 29.0 26.0 - 34.0 pg   MCHC 33.6 30.0 - 36.0 g/dL   RDW 15.0 11.5 - 15.5 %   Platelets 302 150 - 400 K/uL   Neutrophils Relative % 66 43 - 77 %   Neutro Abs 5.0 1.7 - 7.7 K/uL   Lymphocytes Relative 27 12 - 46 %   Lymphs Abs 2.0 0.7 - 4.0 K/uL   Monocytes Relative 4 3 - 12 %   Monocytes Absolute 0.3 0.1 - 1.0 K/uL   Eosinophils Relative 2 0 - 5 %   Eosinophils Absolute 0.1 0.0 - 0.7 K/uL   Basophils Relative 1 0 - 1 %   Basophils Absolute 0.1 0.0 - 0.1 K/uL  Lactic acid, plasma     Status: None   Collection Time: 08/26/14  2:45 PM  Result Value Ref Range   Lactic Acid, Venous 1.3 0.5 - 2.0 mmol/L  Urinalysis, Routine w reflex microscopic     Status: Abnormal   Collection Time: 08/26/14  4:02 PM  Result Value Ref Range   Color, Urine YELLOW YELLOW   APPearance CLOUDY (A) CLEAR   Specific Gravity, Urine 1.025 1.005 - 1.030   pH 6.0 5.0 - 8.0   Glucose, UA 250 (A)  NEGATIVE mg/dL   Hgb urine dipstick MODERATE (A) NEGATIVE   Bilirubin Urine NEGATIVE NEGATIVE   Ketones, ur NEGATIVE NEGATIVE mg/dL   Protein, ur >300 (A) NEGATIVE mg/dL   Urobilinogen, UA 0.2 0.0 - 1.0 mg/dL   Nitrite NEGATIVE NEGATIVE   Leukocytes, UA SMALL (A) NEGATIVE  Urine microscopic-add on     Status: Abnormal   Collection Time: 08/26/14  4:02 PM  Result Value Ref Range   WBC, UA 21-50 <3 WBC/hpf   RBC / HPF 3-6 <3 RBC/hpf   Bacteria, UA MANY (A) RARE  Lactic acid, plasma     Status: None   Collection Time: 08/26/14  5:15 PM  Result Value Ref Range   Lactic Acid, Venous 1.2 0.5 - 2.0 mmol/L  MRSA PCR Screening     Status: None   Collection Time: 08/26/14  8:05 PM  Result Value Ref Range   MRSA by PCR NEGATIVE NEGATIVE    Comment:        The GeneXpert MRSA Assay (FDA approved for NASAL specimens only), is one component of a comprehensive MRSA colonization surveillance program. It is not intended to diagnose MRSA infection nor to guide or monitor treatment for MRSA infections.   CBC      Status: Abnormal   Collection Time: 08/27/14  6:54 AM  Result Value Ref Range   WBC 6.7 4.0 - 10.5 K/uL   RBC 3.23 (L) 3.87 - 5.11 MIL/uL   Hemoglobin 9.2 (L) 12.0 - 15.0 g/dL    Comment: DELTA CHECK NOTED   HCT 28.4 (L) 36.0 - 46.0 %   MCV 87.9 78.0 - 100.0 fL   MCH 28.5 26.0 - 34.0 pg   MCHC 32.4 30.0 - 36.0 g/dL   RDW 15.4 11.5 - 15.5 %   Platelets 262 150 - 400 K/uL  Basic metabolic panel     Status: Abnormal   Collection Time: 08/27/14  6:54 AM  Result Value Ref Range   Sodium 137 135 - 145 mmol/L   Potassium 3.3 (L) 3.5 - 5.1 mmol/L   Chloride 112 96 - 112 mmol/L   CO2 21 19 - 32 mmol/L   Glucose, Bld 111 (H) 70 - 99 mg/dL   BUN 13 6 - 23 mg/dL   Creatinine, Ser 1.52 (H) 0.50 - 1.10 mg/dL   Calcium 7.7 (L) 8.4 - 10.5 mg/dL   GFR calc non Af Amer 40 (L) >90 mL/min   GFR calc Af Amer 46 (L) >90 mL/min    Comment: (NOTE) The eGFR has been calculated using the CKD EPI equation. This calculation has not been validated in all clinical situations. eGFR's persistently <90 mL/min signify possible Chronic Kidney Disease.    Anion gap 4 (L) 5 - 15    ABGS No results for input(s): PHART, PO2ART, TCO2, HCO3 in the last 72 hours.  Invalid input(s): PCO2 CULTURES Recent Results (from the past 240 hour(s))  MRSA PCR Screening     Status: None   Collection Time: 08/26/14  8:05 PM  Result Value Ref Range Status   MRSA by PCR NEGATIVE NEGATIVE Final    Comment:        The GeneXpert MRSA Assay (FDA approved for NASAL specimens only), is one component of a comprehensive MRSA colonization surveillance program. It is not intended to diagnose MRSA infection nor to guide or monitor treatment for MRSA infections.    Studies/Results: Dg Chest 1 View  08/26/2014   CLINICAL DATA:  Suspect urinary tract infection, no  cardiopulmonary symptoms, history of altered mental status.  EXAM: CHEST  1 VIEW  COMPARISON:  Portable chest x-ray of August 12, 2014 and May 04, 2014  FINDINGS:  The lungs are adequately inflated and clear. The cardiac silhouette is mildly enlarged. The pulmonary vascularity is normal. There is no pleural effusion. There is stable minimal blunting of the left lateral costophrenic angle. Harrington rod stabilization appliance is in the thoracic spine appear intact.  IMPRESSION: Mild stable enlargement of the cardiac silhouette. There is no active cardiopulmonary disease.   Electronically Signed   By: David  Martinique   On: 08/26/2014 15:35    Medications: I have reviewed the patient's current medications.  Assesment:    Active Problems:   Decubitus ulcer of buttock   Acute renal failure   UTI (lower urinary tract infection)   Hypokalemia    Plan:  Medications reviewed Will continue iv antibiotic pending culture result Continue K+ supplement Will do anemia profile and stool occult blood test Will monitor CBC/BMP    LOS: 1 day   Arasely Akkerman 08/27/2014, 9:22 AM

## 2014-08-28 LAB — BASIC METABOLIC PANEL
ANION GAP: 5 (ref 5–15)
BUN: 11 mg/dL (ref 6–23)
CALCIUM: 7.1 mg/dL — AB (ref 8.4–10.5)
CO2: 19 mmol/L (ref 19–32)
CREATININE: 1.37 mg/dL — AB (ref 0.50–1.10)
Chloride: 118 mmol/L — ABNORMAL HIGH (ref 96–112)
GFR calc Af Amer: 53 mL/min — ABNORMAL LOW (ref >90)
GFR calc non Af Amer: 45 mL/min — ABNORMAL LOW (ref >90)
GLUCOSE: 119 mg/dL — AB (ref 70–99)
Potassium: 3.1 mmol/L — ABNORMAL LOW (ref 3.5–5.1)
SODIUM: 142 mmol/L (ref 135–145)

## 2014-08-28 LAB — CBC
HCT: 26.5 % — ABNORMAL LOW (ref 36.0–46.0)
Hemoglobin: 8.6 g/dL — ABNORMAL LOW (ref 12.0–15.0)
MCH: 28.9 pg (ref 26.0–34.0)
MCHC: 32.5 g/dL (ref 30.0–36.0)
MCV: 88.9 fL (ref 78.0–100.0)
Platelets: 217 10*3/uL (ref 150–400)
RBC: 2.98 MIL/uL — AB (ref 3.87–5.11)
RDW: 15.6 % — ABNORMAL HIGH (ref 11.5–15.5)
WBC: 5.3 10*3/uL (ref 4.0–10.5)

## 2014-08-28 LAB — VITAMIN B12: Vitamin B-12: 246 pg/mL (ref 211–911)

## 2014-08-28 LAB — FERRITIN: Ferritin: 95 ng/mL (ref 10–291)

## 2014-08-28 LAB — FOLATE: Folate: 18.6 ng/mL

## 2014-08-28 MED ORDER — FERROUS SULFATE 325 (65 FE) MG PO TABS
325.0000 mg | ORAL_TABLET | Freq: Two times a day (BID) | ORAL | Status: DC
  Administered 2014-08-28 – 2014-08-29 (×3): 325 mg via ORAL
  Filled 2014-08-28 (×3): qty 1

## 2014-08-28 MED ORDER — POTASSIUM CHLORIDE 10 MEQ/100ML IV SOLN
10.0000 meq | INTRAVENOUS | Status: AC
  Administered 2014-08-28 (×3): 10 meq via INTRAVENOUS
  Filled 2014-08-28: qty 100
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
## 2014-08-28 NOTE — Progress Notes (Signed)
Subjective: Patient feels better. However, her H/H is dropping. Patient has heavy and frequent vaginal bleeding. No rectal bleeding. Her K+ is also low.  Objective: Vital signs in last 24 hours: Temp:  [97.9 F (36.6 C)-98.3 F (36.8 C)] 98.2 F (36.8 C) (04/03 0548) Pulse Rate:  [74-77] 75 (04/03 0548) Resp:  [18] 18 (04/02 1434) BP: (97-112)/(63-66) 110/66 mmHg (04/03 0548) SpO2:  [95 %-97 %] 95 % (04/03 0548) Weight change:  Last BM Date: 08/27/14  Intake/Output from previous day: 04/02 0701 - 04/03 0700 In: 3919.2 [P.O.:840; I.V.:3029.2; IV Piggyback:50] Out: 3050 [Urine:3050]  PHYSICAL EXAM General appearance: alert and no distress Resp: clear to auscultation bilaterally Cardio: S1, S2 normal GI: soft, non-tender; bowel sounds normal; no masses,  no organomegaly Extremities: paraplegic  Lab Results:  Results for orders placed or performed during the hospital encounter of 08/26/14 (from the past 48 hour(s))  Basic metabolic panel     Status: Abnormal   Collection Time: 08/26/14  2:45 PM  Result Value Ref Range   Sodium 138 135 - 145 mmol/L   Potassium 3.4 (L) 3.5 - 5.1 mmol/L   Chloride 110 96 - 112 mmol/L   CO2 21 19 - 32 mmol/L   Glucose, Bld 122 (H) 70 - 99 mg/dL   BUN 14 6 - 23 mg/dL   Creatinine, Ser 1.74 (H) 0.50 - 1.10 mg/dL   Calcium 8.5 8.4 - 10.5 mg/dL   GFR calc non Af Amer 34 (L) >90 mL/min   GFR calc Af Amer 39 (L) >90 mL/min    Comment: (NOTE) The eGFR has been calculated using the CKD EPI equation. This calculation has not been validated in all clinical situations. eGFR's persistently <90 mL/min signify possible Chronic Kidney Disease.    Anion gap 7 5 - 15  CBC with Differential     Status: Abnormal   Collection Time: 08/26/14  2:45 PM  Result Value Ref Range   WBC 7.5 4.0 - 10.5 K/uL   RBC 4.00 3.87 - 5.11 MIL/uL   Hemoglobin 11.6 (L) 12.0 - 15.0 g/dL   HCT 34.5 (L) 36.0 - 46.0 %   MCV 86.3 78.0 - 100.0 fL   MCH 29.0 26.0 - 34.0 pg    MCHC 33.6 30.0 - 36.0 g/dL   RDW 15.0 11.5 - 15.5 %   Platelets 302 150 - 400 K/uL   Neutrophils Relative % 66 43 - 77 %   Neutro Abs 5.0 1.7 - 7.7 K/uL   Lymphocytes Relative 27 12 - 46 %   Lymphs Abs 2.0 0.7 - 4.0 K/uL   Monocytes Relative 4 3 - 12 %   Monocytes Absolute 0.3 0.1 - 1.0 K/uL   Eosinophils Relative 2 0 - 5 %   Eosinophils Absolute 0.1 0.0 - 0.7 K/uL   Basophils Relative 1 0 - 1 %   Basophils Absolute 0.1 0.0 - 0.1 K/uL  Lactic acid, plasma     Status: None   Collection Time: 08/26/14  2:45 PM  Result Value Ref Range   Lactic Acid, Venous 1.3 0.5 - 2.0 mmol/L  Urinalysis, Routine w reflex microscopic     Status: Abnormal   Collection Time: 08/26/14  4:02 PM  Result Value Ref Range   Color, Urine YELLOW YELLOW   APPearance CLOUDY (A) CLEAR   Specific Gravity, Urine 1.025 1.005 - 1.030   pH 6.0 5.0 - 8.0   Glucose, UA 250 (A) NEGATIVE mg/dL   Hgb urine dipstick MODERATE (A) NEGATIVE  Bilirubin Urine NEGATIVE NEGATIVE   Ketones, ur NEGATIVE NEGATIVE mg/dL   Protein, ur >300 (A) NEGATIVE mg/dL   Urobilinogen, UA 0.2 0.0 - 1.0 mg/dL   Nitrite NEGATIVE NEGATIVE   Leukocytes, UA SMALL (A) NEGATIVE  Urine microscopic-add on     Status: Abnormal   Collection Time: 08/26/14  4:02 PM  Result Value Ref Range   WBC, UA 21-50 <3 WBC/hpf   RBC / HPF 3-6 <3 RBC/hpf   Bacteria, UA MANY (A) RARE  Lactic acid, plasma     Status: None   Collection Time: 08/26/14  5:15 PM  Result Value Ref Range   Lactic Acid, Venous 1.2 0.5 - 2.0 mmol/L  MRSA PCR Screening     Status: None   Collection Time: 08/26/14  8:05 PM  Result Value Ref Range   MRSA by PCR NEGATIVE NEGATIVE    Comment:        The GeneXpert MRSA Assay (FDA approved for NASAL specimens only), is one component of a comprehensive MRSA colonization surveillance program. It is not intended to diagnose MRSA infection nor to guide or monitor treatment for MRSA infections.   CBC     Status: Abnormal   Collection  Time: 08/27/14  6:54 AM  Result Value Ref Range   WBC 6.7 4.0 - 10.5 K/uL   RBC 3.23 (L) 3.87 - 5.11 MIL/uL   Hemoglobin 9.2 (L) 12.0 - 15.0 g/dL    Comment: DELTA CHECK NOTED   HCT 28.4 (L) 36.0 - 46.0 %   MCV 87.9 78.0 - 100.0 fL   MCH 28.5 26.0 - 34.0 pg   MCHC 32.4 30.0 - 36.0 g/dL   RDW 15.4 11.5 - 15.5 %   Platelets 262 150 - 400 K/uL  Basic metabolic panel     Status: Abnormal   Collection Time: 08/27/14  6:54 AM  Result Value Ref Range   Sodium 137 135 - 145 mmol/L   Potassium 3.3 (L) 3.5 - 5.1 mmol/L   Chloride 112 96 - 112 mmol/L   CO2 21 19 - 32 mmol/L   Glucose, Bld 111 (H) 70 - 99 mg/dL   BUN 13 6 - 23 mg/dL   Creatinine, Ser 1.52 (H) 0.50 - 1.10 mg/dL   Calcium 7.7 (L) 8.4 - 10.5 mg/dL   GFR calc non Af Amer 40 (L) >90 mL/min   GFR calc Af Amer 46 (L) >90 mL/min    Comment: (NOTE) The eGFR has been calculated using the CKD EPI equation. This calculation has not been validated in all clinical situations. eGFR's persistently <90 mL/min signify possible Chronic Kidney Disease.    Anion gap 4 (L) 5 - 15  Vitamin B12     Status: None   Collection Time: 08/27/14  9:53 AM  Result Value Ref Range   Vitamin B-12 246 211 - 911 pg/mL    Comment: Performed at [facility]  Folate     Status: None   Collection Time: 08/27/14  9:53 AM  Result Value Ref Range   Folate 18.6 ng/mL    Comment: (NOTE) Reference Ranges        Deficient:       0.4 - 3.3 ng/mL        Indeterminate:   3.4 - 5.4 ng/mL        Normal:              > 5.4 ng/mL Performed at [facility]   Iron and TIBC  Status: Abnormal   Collection Time: 08/27/14  9:53 AM  Result Value Ref Range   Iron 49 42 - 145 ug/dL   TIBC 151 (L) 250 - 470 ug/dL   Saturation Ratios 32 20 - 55 %   UIBC 102 (L) 125 - 400 ug/dL    Comment: Performed at [facility]  Ferritin     Status: None   Collection Time: 08/27/14  9:53 AM  Result Value Ref Range   Ferritin 95 10 - 291 ng/mL    Comment:  Performed at [facility]  Reticulocytes     Status: Abnormal   Collection Time: 08/27/14  9:53 AM  Result Value Ref Range   Retic Ct Pct 3.3 (H) 0.4 - 3.1 %   RBC. 3.01 (L) 3.87 - 5.11 MIL/uL   Retic Count, Manual 99.3 19.0 - 186.0 K/uL  CBC     Status: Abnormal   Collection Time: 08/28/14  6:56 AM  Result Value Ref Range   WBC 5.3 4.0 - 10.5 K/uL   RBC 2.98 (L) 3.87 - 5.11 MIL/uL   Hemoglobin 8.6 (L) 12.0 - 15.0 g/dL   HCT 26.5 (L) 36.0 - 46.0 %   MCV 88.9 78.0 - 100.0 fL   MCH 28.9 26.0 - 34.0 pg   MCHC 32.5 30.0 - 36.0 g/dL   RDW 15.6 (H) 11.5 - 15.5 %   Platelets 217 150 - 400 K/uL  Basic metabolic panel     Status: Abnormal   Collection Time: 08/28/14  6:56 AM  Result Value Ref Range   Sodium 142 135 - 145 mmol/L   Potassium 3.1 (L) 3.5 - 5.1 mmol/L   Chloride 118 (H) 96 - 112 mmol/L   CO2 19 19 - 32 mmol/L   Glucose, Bld 119 (H) 70 - 99 mg/dL   BUN 11 6 - 23 mg/dL   Creatinine, Ser 1.37 (H) 0.50 - 1.10 mg/dL   Calcium 7.1 (L) 8.4 - 10.5 mg/dL   GFR calc non Af Amer 45 (L) >90 mL/min   GFR calc Af Amer 53 (L) >90 mL/min    Comment: (NOTE) The eGFR has been calculated using the CKD EPI equation. This calculation has not been validated in all clinical situations. eGFR's persistently <90 mL/min signify possible Chronic Kidney Disease.    Anion gap 5 5 - 15    ABGS No results for input(s): PHART, PO2ART, TCO2, HCO3 in the last 72 hours.  Invalid input(s): PCO2 CULTURES Recent Results (from the past 240 hour(s))  MRSA PCR Screening     Status: None   Collection Time: 08/26/14  8:05 PM  Result Value Ref Range Status   MRSA by PCR NEGATIVE NEGATIVE Final    Comment:        The GeneXpert MRSA Assay (FDA approved for NASAL specimens only), is one component of a comprehensive MRSA colonization surveillance program. It is not intended to diagnose MRSA infection nor to guide or monitor treatment for MRSA infections.    Studies/Results: Dg Chest 1  View  08/26/2014   CLINICAL DATA:  Suspect urinary tract infection, no cardiopulmonary symptoms, history of altered mental status.  EXAM: CHEST  1 VIEW  COMPARISON:  Portable chest x-ray of August 12, 2014 and May 04, 2014  FINDINGS: The lungs are adequately inflated and clear. The cardiac silhouette is mildly enlarged. The pulmonary vascularity is normal. There is no pleural effusion. There is stable minimal blunting of the left lateral costophrenic angle. Harrington rod stabilization  appliance is in the thoracic spine appear intact.  IMPRESSION: Mild stable enlargement of the cardiac silhouette. There is no active cardiopulmonary disease.   Electronically Signed   By: David  Martinique   On: 08/26/2014 15:35    Medications: I have reviewed the patient's current medications.  Assesment:    Active Problems:   Decubitus ulcer of buttock   Acute renal failure   UTI (lower urinary tract infection)   Hypokalemia    Plan:  Medications reviewed Will continue iv antibiotics Will supplement K+ Will start ferrous sulphate     LOS: 2 days   [name] 08/28/2014, 8:51 AM

## 2014-08-28 NOTE — Progress Notes (Signed)
Pt with suprapubic catheter, passing/leaking urine from urethra.  Cleaned peri area and changed linens.  Will continue to monitor.

## 2014-08-29 LAB — CBC
HCT: 26.1 % — ABNORMAL LOW (ref 36.0–46.0)
Hemoglobin: 8.6 g/dL — ABNORMAL LOW (ref 12.0–15.0)
MCH: 29.2 pg (ref 26.0–34.0)
MCHC: 33 g/dL (ref 30.0–36.0)
MCV: 88.5 fL (ref 78.0–100.0)
Platelets: 236 10*3/uL (ref 150–400)
RBC: 2.95 MIL/uL — ABNORMAL LOW (ref 3.87–5.11)
RDW: 15.5 % (ref 11.5–15.5)
WBC: 5.2 10*3/uL (ref 4.0–10.5)

## 2014-08-29 LAB — URINE CULTURE: Colony Count: >=100000

## 2014-08-29 LAB — BASIC METABOLIC PANEL
Anion gap: 4 — ABNORMAL LOW (ref 5–15)
BUN: 10 mg/dL (ref 6–23)
CO2: 19 mmol/L (ref 19–32)
Calcium: 7.1 mg/dL — ABNORMAL LOW (ref 8.4–10.5)
Chloride: 116 mmol/L — ABNORMAL HIGH (ref 96–112)
Creatinine, Ser: 1.16 mg/dL — ABNORMAL HIGH (ref 0.50–1.10)
GFR calc Af Amer: 64 mL/min — ABNORMAL LOW (ref >90)
GFR, EST NON AFRICAN AMERICAN: 56 mL/min — AB (ref >90)
Glucose, Bld: 93 mg/dL (ref 70–99)
Potassium: 3.5 mmol/L (ref 3.5–5.1)
Sodium: 139 mmol/L (ref 135–145)

## 2014-08-29 MED ORDER — CIPROFLOXACIN HCL 500 MG PO TABS: 500.0000 mg | ORAL_TABLET | Freq: Two times a day (BID) | ORAL | Status: DC

## 2014-08-29 NOTE — Discharge Summary (Signed)
Physician Discharge Summary  Patient ID: [name] MRN: [id number] DOB/AGE: 05/27/1966 49 y.o. Primary Care Physician:Litsy Epting, MD Admit date: 08/26/2014 Discharge date: 08/29/2014    Discharge Diagnoses:   Active Problems:   Decubitus ulcer of buttock   Acute renal failure   UTI (lower urinary tract infection)   Hypokalemia     Medication List    TAKE these medications        amLODipine 10 MG tablet  Commonly known as:  NORVASC  Take 1 tablet (10 mg total) by mouth daily.     baclofen 20 MG tablet  Commonly known as:  LIORESAL  Take 1 tablet (20 mg total) by mouth 4 (four) times daily.     ciprofloxacin 500 MG tablet  Commonly known as:  CIPRO  Take 1 tablet (500 mg total) by mouth 2 (two) times daily.     cloNIDine 0.2 MG tablet  Commonly known as:  CATAPRES  Take 1 tablet (0.2 mg total) by mouth 3 (three) times daily.     docusate sodium 100 MG capsule  Commonly known as:  COLACE  Take 100 mg by mouth at bedtime.     furosemide 40 MG tablet  Commonly known as:  LASIX  Take 40 mg by mouth daily.     gabapentin 300 MG capsule  Commonly known as:  NEURONTIN  Take 300 mg by mouth 3 (three) times daily.     levETIRAcetam 1000 MG tablet  Commonly known as:  KEPPRA  Take 1 tablet (1,000 mg total) by mouth 2 (two) times daily.     metoprolol tartrate 25 MG tablet  Commonly known as:  LOPRESSOR  Take 1 tablet (25 mg total) by mouth 2 (two) times daily.     MULTIVITAMIN ADULT PO  Take by mouth daily.     pantoprazole 40 MG tablet  Commonly known as:  PROTONIX  Take 1 tablet (40 mg total) by mouth daily at 12 noon.     potassium chloride SA 20 MEQ tablet  Commonly known as:  K-DUR,KLOR-CON  Take 20 mEq by mouth daily.     TOVIAZ 8 MG Tb24 tablet  Generic drug:  fesoterodine  Take 8 mg by mouth daily.        Discharged Condition: improved     Consults: None  Significant Diagnostic Studies: Dg Chest 1 View  08/26/2014   CLINICAL DATA:   Suspect urinary tract infection, no cardiopulmonary symptoms, history of altered mental status.  EXAM: CHEST  1 VIEW  COMPARISON:  Portable chest x-ray of August 12, 2014 and May 04, 2014  FINDINGS: The lungs are adequately inflated and clear. The cardiac silhouette is mildly enlarged. The pulmonary vascularity is normal. There is no pleural effusion. There is stable minimal blunting of the left lateral costophrenic angle. Harrington rod stabilization appliance is in the thoracic spine appear intact.  IMPRESSION: Mild stable enlargement of the cardiac silhouette. There is no active cardiopulmonary disease.   Electronically Signed   By: David  Martinique   On: 08/26/2014 15:35   Dg Chest Port 1 View  08/12/2014   CLINICAL DATA:  Hypertension fever.  Uterine cancer.  Weakness.  EXAM: PORTABLE CHEST - 1 VIEW  COMPARISON:  One-view chest 05/04/2014.  FINDINGS: The heart size is normal. The patient is rotated to the left. Thoracic spine stabilization is again noted. There is no edema or effusion. Aeration at the left base is improved. No focal airspace disease is evident. The displaced right clavicle fracture is  again noted. The visualized soft tissues and bony thorax are otherwise unremarkable.  IMPRESSION: 1. No acute cardiopulmonary disease. 2. Stable appearance of thoracic spinal stabilization hardware. 3. Chronic right clavicle fracture.   Electronically Signed   By: San Morelle M.D.   On: 08/12/2014 14:57    Lab Results: Basic Metabolic Panel:  Recent Labs  08/28/14 0656 08/29/14 0539  NA 142 139  K 3.1* 3.5  CL 118* 116*  CO2 19 19  GLUCOSE 119* 93  BUN 11 10  CREATININE 1.37* 1.16*  CALCIUM 7.1* 7.1*   Liver Function Tests: No results for input(s): AST, ALT, ALKPHOS, BILITOT, PROT, ALBUMIN in the last 72 hours.   CBC:  Recent Labs  08/26/14 1445  08/28/14 0656 08/29/14 0539  WBC 7.5  < > 5.3 5.2  NEUTROABS 5.0  --   --   --   HGB 11.6*  < > 8.6* 8.6*  HCT 34.5*  < >  26.5* 26.1*  MCV 86.3  < > 88.9 88.5  PLT 302  < > 217 236  < > = values in this interval not displayed.  Recent Results (from the past 240 hour(s))  Urine culture     Status: None (Preliminary result)   Collection Time: 08/26/14  4:03 PM  Result Value Ref Range Status   Specimen Description URINE, SUPRAPUBIC  Final   Special Requests NONE  Final   Colony Count   Final    >=100,000 COLONIES/ML Performed at [facility]    Culture   Final    ESCHERICHIA COLI Performed at [facility]    Report Status PENDING  Incomplete  MRSA PCR Screening     Status: None   Collection Time: 08/26/14  8:05 PM  Result Value Ref Range Status   MRSA by PCR NEGATIVE NEGATIVE Final    Comment:        The GeneXpert MRSA Assay (FDA approved for NASAL specimens only), is one component of a comprehensive MRSA colonization surveillance program. It is not intended to diagnose MRSA infection nor to guide or monitor treatment for MRSA infections.      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 Her urinalysis was abnormal. She was started on IV rocephin. Patient improved. Her urine culture grew E.coli. Sensitivity is pending. Patient will be started on ciprofloxin and will be discharge home today.   Discharge Exam: Blood pressure 119/75, pulse 78, temperature 98.4 F (36.9 C), temperature source Oral, resp. rate 18, height 5' (1.524 m), weight 73.437 kg (161 lb 14.4 oz), SpO2 95 %.    Disposition:  Home        Follow-up Information    Follow up with [facility], MD In 2 weeks.   Specialty:  Internal Medicine   Contact information:   [place] [address] [phone]       Signed: [name]   08/29/2014, 8:23 AM

## 2014-08-29 NOTE — Care Management Utilization Note (Signed)
UR completed 

## 2014-08-29 NOTE — Progress Notes (Signed)
Patient discharged home today.  Patient's caregiver was given discharge instructions, prescriptions, and care notes.  Caregiver verbalized understanding with no complaints or concerns voiced at this time.  Patient's IV was removed with catheter intact, no bleeding or complications.  Patient left unit in stable condition with a staff member in a wheelchair.

## 2014-08-29 NOTE — Care Management Note (Signed)
    Page 1 of 1   08/29/2014     9:04:07 AM CARE MANAGEMENT NOTE 08/29/2014  Patient:  [name], [name]   Account Number:  [id number]  Date Initiated:  08/29/2014  Documentation initiated by:  Jolene Provost  Subjective/Objective Assessment:   Pt is from home, lives with son. Pt is not independent at baseline. Pt has a CAP aid seven days a week. Pt has a lift, wheelchair and ramp. Pt says she has no DME needs. No CM needs.     Action/Plan:   Anticipated DC Date:  08/29/2014   Anticipated DC Plan:  [place]  CM consult      Choice offered to / List presented to:             Status of service:  Completed, signed off Medicare Important Message given?   (If response is "NO", the following Medicare IM given date fields will be blank) Date Medicare IM given:   Medicare IM given by:   Date Additional Medicare IM given:   Additional Medicare IM given by:    Discharge Disposition:  HOME/SELF CARE  Per UR Regulation:  Reviewed for med. necessity/level of care/duration of stay  If discussed at [place] of Stay Meetings, dates discussed:    Comments:  08/29/2014 0900 Jolene Provost, RN, MSN, CM

## 2014-09-05 ENCOUNTER — Telehealth: Payer: Self-pay | Admitting: Neurology

## 2014-09-05 ENCOUNTER — Other Ambulatory Visit: Payer: Self-pay | Admitting: Neurology

## 2014-09-05 DIAGNOSIS — C711 Malignant neoplasm of frontal lobe: Secondary | ICD-10-CM

## 2014-09-05 NOTE — Telephone Encounter (Signed)
[name] - Would you call patient and let her know it has been about 6 months since we had the last MRI of hr brain and it is time to repeat it as we discussed to make sure that everything is evolving as we expect please? If she has any further questions, let me know. Thank you.

## 2014-09-07 ENCOUNTER — Telehealth: Payer: Self-pay | Admitting: *Deleted

## 2014-09-07 NOTE — Telephone Encounter (Signed)
Thank you :)

## 2014-09-07 NOTE — Telephone Encounter (Signed)
Talked with pt caregiver because pt gave verbal agreement over the phone that I could talk with her. Pt stated "You can talk with her and tell her anything you need to". I told them that the repeat MRI of brain was ordered and they should be receiving a call to get that scheduled. They also verified the appt for 09/14/14 at 1:00 pm and are aware.

## 2014-09-14 ENCOUNTER — Ambulatory Visit (INDEPENDENT_AMBULATORY_CARE_PROVIDER_SITE_OTHER): Payer: Medicaid Other | Admitting: Neurology

## 2014-09-14 ENCOUNTER — Encounter: Payer: Self-pay | Admitting: Neurology

## 2014-09-14 VITALS — BP 121/79 | HR 90 | Temp 97.0°F | Ht 60.0 in

## 2014-09-14 DIAGNOSIS — R569 Unspecified convulsions: Secondary | ICD-10-CM | POA: Diagnosis not present
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
## 2014-09-16 LAB — LEVETIRACETAM LEVEL: LEVETIRACETAM: 40.1 ug/mL — AB (ref 10.0–40.0)

## 2014-09-22 ENCOUNTER — Inpatient Hospital Stay: Admission: RE | Admit: 2014-09-22 | Payer: Medicaid Other | Source: Ambulatory Visit

## 2014-10-04 ENCOUNTER — Encounter (INDEPENDENT_AMBULATORY_CARE_PROVIDER_SITE_OTHER): Payer: Medicaid Other | Admitting: Diagnostic Neuroimaging

## 2014-10-04 ENCOUNTER — Ambulatory Visit
Admission: RE | Admit: 2014-10-04 | Discharge: 2014-10-04 | Disposition: A | Discharge status: Home / Self Care | Payer: Medicaid Other | Source: Ambulatory Visit | Attending: Neurology | Admitting: Neurology

## 2014-10-04 DIAGNOSIS — C711 Malignant neoplasm of frontal lobe: Secondary | ICD-10-CM

## 2014-10-04 MED ORDER — GADOBENATE DIMEGLUMINE 529 MG/ML IV SOLN
15.0000 mL | Freq: Once | INTRAVENOUS | Status: AC | PRN
  Administered 2014-10-04: 15 mL via INTRAVENOUS

## 2014-10-06 ENCOUNTER — Telehealth: Payer: Self-pay

## 2014-10-06 NOTE — Telephone Encounter (Signed)
Spoke with pt and informed her of her MRI results, she has not other concerns at this time.  Thanks

## 2014-10-09 ENCOUNTER — Encounter (HOSPITAL_COMMUNITY): Payer: Self-pay | Admitting: *Deleted

## 2014-10-09 ENCOUNTER — Inpatient Hospital Stay (HOSPITAL_COMMUNITY)
Admission: EM | Admit: 2014-10-09 | Discharge: 2014-10-19 | DRG: 871 | Disposition: A | Discharge status: Skilled Nursing Facility | Payer: Medicaid Other | Attending: Internal Medicine | Admitting: Internal Medicine

## 2014-10-09 ENCOUNTER — Emergency Department (HOSPITAL_COMMUNITY): Payer: Medicaid Other

## 2014-10-09 DIAGNOSIS — G9341 Metabolic encephalopathy: Secondary | ICD-10-CM | POA: Diagnosis present

## 2014-10-09 DIAGNOSIS — G9389 Other specified disorders of brain: Secondary | ICD-10-CM

## 2014-10-09 DIAGNOSIS — R509 Fever, unspecified: Secondary | ICD-10-CM

## 2014-10-09 DIAGNOSIS — E876 Hypokalemia: Secondary | ICD-10-CM | POA: Diagnosis present

## 2014-10-09 DIAGNOSIS — I10 Essential (primary) hypertension: Secondary | ICD-10-CM | POA: Diagnosis present

## 2014-10-09 DIAGNOSIS — Z87440 Personal history of urinary (tract) infections: Secondary | ICD-10-CM

## 2014-10-09 DIAGNOSIS — A419 Sepsis, unspecified organism: Principal | ICD-10-CM

## 2014-10-09 DIAGNOSIS — B9620 Unspecified Escherichia coli [E. coli] as the cause of diseases classified elsewhere: Secondary | ICD-10-CM | POA: Diagnosis present

## 2014-10-09 DIAGNOSIS — J189 Pneumonia, unspecified organism: Secondary | ICD-10-CM

## 2014-10-09 DIAGNOSIS — E785 Hyperlipidemia, unspecified: Secondary | ICD-10-CM | POA: Diagnosis present

## 2014-10-09 DIAGNOSIS — N179 Acute kidney failure, unspecified: Secondary | ICD-10-CM | POA: Diagnosis present

## 2014-10-09 DIAGNOSIS — G40201 Localization-related (focal) (partial) symptomatic epilepsy and epileptic syndromes with complex partial seizures, not intractable, with status epilepticus: Secondary | ICD-10-CM | POA: Diagnosis present

## 2014-10-09 DIAGNOSIS — Z9114 Patient's other noncompliance with medication regimen: Secondary | ICD-10-CM | POA: Diagnosis present

## 2014-10-09 DIAGNOSIS — G8220 Paraplegia, unspecified: Secondary | ICD-10-CM | POA: Diagnosis present

## 2014-10-09 DIAGNOSIS — E43 Unspecified severe protein-calorie malnutrition: Secondary | ICD-10-CM | POA: Diagnosis present

## 2014-10-09 DIAGNOSIS — R4182 Altered mental status, unspecified: Secondary | ICD-10-CM

## 2014-10-09 DIAGNOSIS — Z79899 Other long term (current) drug therapy: Secondary | ICD-10-CM

## 2014-10-09 DIAGNOSIS — N390 Urinary tract infection, site not specified: Secondary | ICD-10-CM | POA: Diagnosis present

## 2014-10-09 DIAGNOSIS — X58XXXA Exposure to other specified factors, initial encounter: Secondary | ICD-10-CM | POA: Diagnosis present

## 2014-10-09 DIAGNOSIS — G40909 Epilepsy, unspecified, not intractable, without status epilepticus: Secondary | ICD-10-CM

## 2014-10-09 DIAGNOSIS — I6783 Posterior reversible encephalopathy syndrome: Secondary | ICD-10-CM | POA: Diagnosis present

## 2014-10-09 DIAGNOSIS — Z8673 Personal history of transient ischemic attack (TIA), and cerebral infarction without residual deficits: Secondary | ICD-10-CM

## 2014-10-09 DIAGNOSIS — S069X9A Unspecified intracranial injury with loss of consciousness of unspecified duration, initial encounter: Secondary | ICD-10-CM | POA: Diagnosis present

## 2014-10-09 DIAGNOSIS — Z9359 Other cystostomy status: Secondary | ICD-10-CM

## 2014-10-09 DIAGNOSIS — S069XAA Unspecified intracranial injury with loss of consciousness status unknown, initial encounter: Secondary | ICD-10-CM | POA: Diagnosis present

## 2014-10-09 DIAGNOSIS — J9690 Respiratory failure, unspecified, unspecified whether with hypoxia or hypercapnia: Secondary | ICD-10-CM | POA: Diagnosis present

## 2014-10-09 DIAGNOSIS — Z6841 Body Mass Index (BMI) 40.0 and over, adult: Secondary | ICD-10-CM

## 2014-10-09 DIAGNOSIS — L8990 Pressure ulcer of unspecified site, unspecified stage: Secondary | ICD-10-CM | POA: Diagnosis present

## 2014-10-09 DIAGNOSIS — C55 Malignant neoplasm of uterus, part unspecified: Secondary | ICD-10-CM | POA: Diagnosis present

## 2014-10-09 DIAGNOSIS — Y95 Nosocomial condition: Secondary | ICD-10-CM | POA: Diagnosis present

## 2014-10-09 DIAGNOSIS — G9340 Encephalopathy, unspecified: Secondary | ICD-10-CM | POA: Diagnosis present

## 2014-10-09 DIAGNOSIS — Z888 Allergy status to other drugs, medicaments and biological substances status: Secondary | ICD-10-CM

## 2014-10-09 LAB — CBC WITH DIFFERENTIAL/PLATELET
BASOS ABS: 0 10*3/uL (ref 0.0–0.1)
BASOS PCT: 0 % (ref 0–1)
EOS PCT: 0 % (ref 0–5)
Eosinophils Absolute: 0 10*3/uL (ref 0.0–0.7)
HEMATOCRIT: 30.9 % — AB (ref 36.0–46.0)
Hemoglobin: 10.4 g/dL — ABNORMAL LOW (ref 12.0–15.0)
Lymphocytes Relative: 6 % — ABNORMAL LOW (ref 12–46)
Lymphs Abs: 1.2 10*3/uL (ref 0.7–4.0)
MCH: 29.7 pg (ref 26.0–34.0)
MCHC: 33.7 g/dL (ref 30.0–36.0)
MCV: 88.3 fL (ref 78.0–100.0)
MONOS PCT: 3 % (ref 3–12)
Monocytes Absolute: 0.6 10*3/uL (ref 0.1–1.0)
NEUTROS ABS: 17.8 10*3/uL — AB (ref 1.7–7.7)
Neutrophils Relative %: 91 % — ABNORMAL HIGH (ref 43–77)
Platelets: 228 10*3/uL (ref 150–400)
RBC: 3.5 MIL/uL — ABNORMAL LOW (ref 3.87–5.11)
RDW: 13.8 % (ref 11.5–15.5)
WBC: 19.6 10*3/uL — ABNORMAL HIGH (ref 4.0–10.5)

## 2014-10-09 LAB — URINALYSIS, ROUTINE W REFLEX MICROSCOPIC
Bilirubin Urine: NEGATIVE
Glucose, UA: 100 mg/dL — AB
Ketones, ur: NEGATIVE mg/dL
Nitrite: POSITIVE — AB
PH: 7.5 (ref 5.0–8.0)
Protein, ur: 100 mg/dL — AB
SPECIFIC GRAVITY, URINE: 1.015 (ref 1.005–1.030)
Urobilinogen, UA: 0.2 mg/dL (ref 0.0–1.0)

## 2014-10-09 LAB — URINE MICROSCOPIC-ADD ON

## 2014-10-09 LAB — LIPASE, BLOOD: Lipase: 20 U/L — ABNORMAL LOW (ref 22–51)

## 2014-10-09 LAB — COMPREHENSIVE METABOLIC PANEL
ALK PHOS: 82 U/L (ref 38–126)
ALT: 13 U/L — AB (ref 14–54)
ANION GAP: 7 (ref 5–15)
AST: 18 U/L (ref 15–41)
Albumin: 1.8 g/dL — ABNORMAL LOW (ref 3.5–5.0)
BUN: 17 mg/dL (ref 6–20)
CO2: 21 mmol/L — ABNORMAL LOW (ref 22–32)
Calcium: 7.7 mg/dL — ABNORMAL LOW (ref 8.9–10.3)
Chloride: 111 mmol/L (ref 101–111)
Creatinine, Ser: 1.63 mg/dL — ABNORMAL HIGH (ref 0.44–1.00)
GFR, EST AFRICAN AMERICAN: 43 mL/min — AB (ref >60)
GFR, EST NON AFRICAN AMERICAN: 37 mL/min — AB (ref >60)
GLUCOSE: 110 mg/dL — AB (ref 65–99)
Potassium: 3.4 mmol/L — ABNORMAL LOW (ref 3.5–5.1)
Sodium: 139 mmol/L (ref 135–145)
Total Bilirubin: 0.4 mg/dL (ref 0.3–1.2)
Total Protein: 5.2 g/dL — ABNORMAL LOW (ref 6.5–8.1)

## 2014-10-09 LAB — LACTIC ACID, PLASMA: LACTIC ACID, VENOUS: 1.3 mmol/L (ref 0.5–2.0)

## 2014-10-09 MED ORDER — DEXTROSE 5 % IV SOLN
1.0000 g | Freq: Once | INTRAVENOUS | Status: AC
  Administered 2014-10-09: 1 g via INTRAVENOUS
  Filled 2014-10-09: qty 10

## 2014-10-09 MED ORDER — SODIUM CHLORIDE 0.9 % IV BOLUS (SEPSIS)
1000.0000 mL | Freq: Once | INTRAVENOUS | Status: AC
  Administered 2014-10-09: 1000 mL via INTRAVENOUS

## 2014-10-09 MED ORDER — VANCOMYCIN HCL IN DEXTROSE 1-5 GM/200ML-% IV SOLN
1000.0000 mg | Freq: Once | INTRAVENOUS | Status: AC
  Administered 2014-10-10: 1000 mg via INTRAVENOUS
  Filled 2014-10-09: qty 200

## 2014-10-09 MED ORDER — SODIUM CHLORIDE 0.9 % IV BOLUS (SEPSIS)
1000.0000 mL | Freq: Once | INTRAVENOUS | Status: AC
  Administered 2014-10-09 (×2): 1000 mL via INTRAVENOUS

## 2014-10-09 MED ORDER — DEXTROSE 5 % IV SOLN
500.0000 mg | Freq: Once | INTRAVENOUS | Status: AC
  Administered 2014-10-09: 500 mg via INTRAVENOUS
  Filled 2014-10-09: qty 500

## 2014-10-09 MED ORDER — DEXTROSE 5 % IV SOLN
1.0000 g | Freq: Two times a day (BID) | INTRAVENOUS | Status: DC
  Filled 2014-10-09 (×2): qty 1

## 2014-10-09 MED ORDER — ACETAMINOPHEN 650 MG RE SUPP
650.0000 mg | Freq: Once | RECTAL | Status: AC
  Administered 2014-10-09: 650 mg via RECTAL

## 2014-10-09 MED ORDER — ACETAMINOPHEN 650 MG RE SUPP
RECTAL | Status: AC
  Filled 2014-10-09: qty 1

## 2014-10-09 NOTE — ED Notes (Signed)
Pt brought in by [facility] for c/o tremors; family told ems that pt began having tremors x 2 days ago; pt is hot to the touch and was recently treated for UTI

## 2014-10-09 NOTE — ED Notes (Signed)
Pt given 4mg  Ativan en route to hospital by ems

## 2014-10-09 NOTE — ED Provider Notes (Signed)
D/w Dr Einar Grad  Added on appropriate abx for HCAP, sepsis.  Will CT head to evaluate further.  Pt remains unresponsive.  Plan on admission to ICU assuming head CT does not show any acute findings.  Dorie Rank, MD 10/09/14 [phone]

## 2014-10-09 NOTE — ED Notes (Signed)
Pt's son is Alma Downs who can be reached at [phone] if needed for anything.

## 2014-10-09 NOTE — ED Provider Notes (Addendum)
CSN: [id number]     Arrival date & time 10/09/14  1956 History   First MD Initiated Contact with Patient 10/09/14 2012     Chief Complaint  Patient presents with  . Fever     (Consider location/radiation/quality/duration/timing/severity/associated sxs/prior Treatment) HPI ....... level V caveat for obtundation. Son reports that his mother was normal this morning. When he came home at approximately 2 PM she was acting differently. Specifically she was more obtunded. EMS reports tremors and fever. She was given Ativan 4 mg IV en route [uncertain rationale for same]. No known seizure in the past 24 hours. No prodromal URI Past Medical History  Diagnosis Date  . Paraplegia (lower)   . Seizure disorder   . High blood pressure   . Suprapubic catheter   . Cancer     uterine  . Seizures   . Abnormal uterine bleeding (AUB) 06/15/2014   Past Surgical History  Procedure Laterality Date  . Back surgery      Pt stated "before 2000"   Family History  Problem Relation Age of Onset  . Cancer Mother   . Hypertension Mother   . Cancer Sister     breast and then spread everywhere.  . Diabetes Paternal Grandmother   . Hypertension Paternal Grandmother    History  Substance Use Topics  . Smoking status: Never Smoker   . Smokeless tobacco: Never Used  . Alcohol Use: 0.0 oz/week    0 Standard drinks or equivalent per week     Comment: every once in a while   OB History    Gravida Para Term Preterm AB TAB SAB Ectopic Multiple Living   4 3   1  1   3      Review of Systems  All other systems reviewed and are negative.     Allergies  Quinine derivatives  Home Medications   Prior to Admission medications   Medication Sig Start Date End Date Taking? Authorizing Provider  amLODipine (NORVASC) 10 MG tablet Take 1 tablet (10 mg total) by mouth daily. 05/10/14  Yes Donne Hazel, MD  baclofen (LIORESAL) 20 MG tablet Take 1 tablet (20 mg total) by mouth 4 (four) times daily. Patient  taking differently: Take 40 mg by mouth 4 (four) times daily.  06/14/14  Yes Melvenia Beam, MD  cloNIDine (CATAPRES) 0.2 MG tablet Take 1 tablet (0.2 mg total) by mouth 3 (three) times daily. 05/10/14  Yes Donne Hazel, MD  docusate sodium (COLACE) 100 MG capsule Take 100 mg by mouth at bedtime.   Yes Historical Provider, MD  fesoterodine (TOVIAZ) 8 MG TB24 tablet Take 8 mg by mouth daily.   Yes Historical Provider, MD  furosemide (LASIX) 40 MG tablet Take 40 mg by mouth daily.   Yes Historical Provider, MD  gabapentin (NEURONTIN) 300 MG capsule Take 300 mg by mouth 3 (three) times daily.    Yes Historical Provider, MD  levETIRAcetam (KEPPRA) 1000 MG tablet Take 1 tablet (1,000 mg total) by mouth 2 (two) times daily. Patient taking differently: Take 1,000 mg by mouth 3 (three) times daily.  05/10/14  Yes Donne Hazel, MD  metoprolol tartrate (LOPRESSOR) 25 MG tablet Take 1 tablet (25 mg total) by mouth 2 (two) times daily. 05/10/14  Yes Donne Hazel, MD  Multiple Vitamins-Minerals (MULTIVITAMIN ADULT PO) Take by mouth daily.   Yes Historical Provider, MD  pantoprazole (PROTONIX) 40 MG tablet Take 1 tablet (40 mg total) by mouth daily at 12  noon. 05/10/14  Yes Donne Hazel, MD  potassium chloride SA (K-DUR,KLOR-CON) 20 MEQ tablet Take 20 mEq by mouth daily.   Yes Historical Provider, MD  ciprofloxacin (CIPRO) 500 MG tablet Take 1 tablet (500 mg total) by mouth 2 (two) times daily. Patient not taking: Reported on 09/14/2014 08/29/14   [name], MD   BP 86/57 mmHg  Pulse 133  Resp 16  SpO2 96% Physical Exam  Constitutional:  Patient is somnolent  HENT:  Head: Normocephalic and atraumatic.  Eyes: Conjunctivae and EOM are normal. Pupils are equal, round, and reactive to light.  Neck: Normal range of motion. Neck supple.  No stiff neck  Cardiovascular: Normal rate and regular rhythm.   Pulmonary/Chest: Effort normal and breath sounds normal.  Abdominal: Soft. Bowel sounds are  normal.  Musculoskeletal:  Paraplegic, unable to do good musculoskeletal exam  Neurological:  Somnolent  Skin: Skin is warm.  Warm to touch  Psychiatric:  Unable  Nursing note and vitals reviewed.   ED Course  Procedures (including critical care time) Labs Review Labs Reviewed  CULTURE, BLOOD (ROUTINE X 2)  CULTURE, BLOOD (ROUTINE X 2)  COMPREHENSIVE METABOLIC PANEL  CBC WITH DIFFERENTIAL/PLATELET  LIPASE, BLOOD  URINALYSIS, ROUTINE W REFLEX MICROSCOPIC  LACTIC ACID, PLASMA    Imaging Review Dg Chest Port 1 View  10/09/2014   CLINICAL DATA:  Tremors. Fever for 2 days. Hypertension. Uterine cancer. Paralysis. Lethargy.  EXAM: PORTABLE CHEST - 1 VIEW  COMPARISON:  08/26/2014  FINDINGS: Harrington rods noted. Chronic nonunited right clavicular fracture. The patient is rotated to the left on today's radiograph, reducing diagnostic sensitivity and specificity. Moderate enlargement of the cardiopericardial silhouette.  Airspace opacity in the left lower lobe obscures the left hemidiaphragm. There is mild subsegmental atelectasis along the right hemidiaphragm.  Low lung volumes.  IMPRESSION: 1. The dominant finding is considerable left lower lobe airspace opacity obscuring the left hemidiaphragm, concerning for pneumonia or atelectasis. 2. Subsegmental atelectasis at the right lung base. 3. Moderate enlargement of the cardiopericardial silhouette.   Electronically Signed   By: Van Clines M.D.   On: 10/09/2014 21:06     EKG Interpretation None     CRITICAL CARE Performed by: [name] Total critical care time: 30 Critical care time was exclusive of separately billable procedures and treating other patients. Critical care was necessary to treat or prevent imminent or life-threatening deterioration. Critical care was time spent personally by me on the following activities: development of treatment plan with patient and/or surrogate as well as nursing, discussions with  consultants, evaluation of patient's response to treatment, examination of patient, obtaining history from patient or surrogate, ordering and performing treatments and interventions, ordering and review of laboratory studies, ordering and review of radiographic studies, pulse oximetry and re-evaluation of patient's condition. MDM   Final diagnoses:  Community acquired pneumonia    Chest x-ray shows evidence of a left lower lobe airspace opacity and turning for pneumonia. IV Zithromax, IV Rocephin given after blood cultures drawn.. Vigorous IV hydration with 2 peripheral IVs. Discussed with Dr. Hillard Danker.  Urinalysis pending    [name], MD 10/09/14 [id number]  [name], MD 10/09/14 [id number]

## 2014-10-10 ENCOUNTER — Inpatient Hospital Stay (HOSPITAL_COMMUNITY): Payer: Medicaid Other

## 2014-10-10 ENCOUNTER — Inpatient Hospital Stay (HOSPITAL_COMMUNITY)
Admit: 2014-10-10 | Discharge: 2014-10-10 | Disposition: A | Discharge status: Home / Self Care | Payer: Medicaid Other | Attending: Family Medicine | Admitting: Family Medicine

## 2014-10-10 DIAGNOSIS — G9389 Other specified disorders of brain: Secondary | ICD-10-CM

## 2014-10-10 DIAGNOSIS — E43 Unspecified severe protein-calorie malnutrition: Secondary | ICD-10-CM | POA: Diagnosis present

## 2014-10-10 DIAGNOSIS — Z8673 Personal history of transient ischemic attack (TIA), and cerebral infarction without residual deficits: Secondary | ICD-10-CM | POA: Diagnosis not present

## 2014-10-10 DIAGNOSIS — K913 Postprocedural intestinal obstruction: Secondary | ICD-10-CM | POA: Diagnosis not present

## 2014-10-10 DIAGNOSIS — Z79899 Other long term (current) drug therapy: Secondary | ICD-10-CM | POA: Diagnosis not present

## 2014-10-10 DIAGNOSIS — L8990 Pressure ulcer of unspecified site, unspecified stage: Secondary | ICD-10-CM | POA: Diagnosis present

## 2014-10-10 DIAGNOSIS — J189 Pneumonia, unspecified organism: Secondary | ICD-10-CM | POA: Diagnosis present

## 2014-10-10 DIAGNOSIS — G9341 Metabolic encephalopathy: Secondary | ICD-10-CM | POA: Diagnosis present

## 2014-10-10 DIAGNOSIS — G40909 Epilepsy, unspecified, not intractable, without status epilepticus: Secondary | ICD-10-CM

## 2014-10-10 DIAGNOSIS — S069X9A Unspecified intracranial injury with loss of consciousness of unspecified duration, initial encounter: Secondary | ICD-10-CM | POA: Diagnosis present

## 2014-10-10 DIAGNOSIS — Y95 Nosocomial condition: Secondary | ICD-10-CM | POA: Diagnosis present

## 2014-10-10 DIAGNOSIS — I10 Essential (primary) hypertension: Secondary | ICD-10-CM | POA: Diagnosis present

## 2014-10-10 DIAGNOSIS — N179 Acute kidney failure, unspecified: Secondary | ICD-10-CM

## 2014-10-10 DIAGNOSIS — N390 Urinary tract infection, site not specified: Secondary | ICD-10-CM | POA: Diagnosis not present

## 2014-10-10 DIAGNOSIS — T8351XA Infection and inflammatory reaction due to indwelling urinary catheter, initial encounter: Secondary | ICD-10-CM | POA: Diagnosis not present

## 2014-10-10 DIAGNOSIS — I6783 Posterior reversible encephalopathy syndrome: Secondary | ICD-10-CM | POA: Diagnosis present

## 2014-10-10 DIAGNOSIS — Z9359 Other cystostomy status: Secondary | ICD-10-CM

## 2014-10-10 DIAGNOSIS — X58XXXA Exposure to other specified factors, initial encounter: Secondary | ICD-10-CM | POA: Diagnosis present

## 2014-10-10 DIAGNOSIS — B9620 Unspecified Escherichia coli [E. coli] as the cause of diseases classified elsewhere: Secondary | ICD-10-CM | POA: Diagnosis present

## 2014-10-10 DIAGNOSIS — G8220 Paraplegia, unspecified: Secondary | ICD-10-CM

## 2014-10-10 DIAGNOSIS — S069XAA Unspecified intracranial injury with loss of consciousness status unknown, initial encounter: Secondary | ICD-10-CM | POA: Diagnosis present

## 2014-10-10 DIAGNOSIS — Z6841 Body Mass Index (BMI) 40.0 and over, adult: Secondary | ICD-10-CM | POA: Diagnosis not present

## 2014-10-10 DIAGNOSIS — J9690 Respiratory failure, unspecified, unspecified whether with hypoxia or hypercapnia: Secondary | ICD-10-CM | POA: Diagnosis present

## 2014-10-10 DIAGNOSIS — E785 Hyperlipidemia, unspecified: Secondary | ICD-10-CM | POA: Diagnosis present

## 2014-10-10 DIAGNOSIS — Z9114 Patient's other noncompliance with medication regimen: Secondary | ICD-10-CM | POA: Diagnosis present

## 2014-10-10 DIAGNOSIS — A419 Sepsis, unspecified organism: Secondary | ICD-10-CM | POA: Diagnosis present

## 2014-10-10 DIAGNOSIS — E876 Hypokalemia: Secondary | ICD-10-CM | POA: Diagnosis present

## 2014-10-10 DIAGNOSIS — Z87440 Personal history of urinary (tract) infections: Secondary | ICD-10-CM | POA: Diagnosis not present

## 2014-10-10 DIAGNOSIS — G40201 Localization-related (focal) (partial) symptomatic epilepsy and epileptic syndromes with complex partial seizures, not intractable, with status epilepticus: Secondary | ICD-10-CM | POA: Diagnosis present

## 2014-10-10 DIAGNOSIS — Z888 Allergy status to other drugs, medicaments and biological substances status: Secondary | ICD-10-CM | POA: Diagnosis not present

## 2014-10-10 DIAGNOSIS — G9340 Encephalopathy, unspecified: Secondary | ICD-10-CM | POA: Diagnosis present

## 2014-10-10 DIAGNOSIS — C55 Malignant neoplasm of uterus, part unspecified: Secondary | ICD-10-CM | POA: Diagnosis present

## 2014-10-10 DIAGNOSIS — R410 Disorientation, unspecified: Secondary | ICD-10-CM | POA: Diagnosis not present

## 2014-10-10 LAB — CBC WITH DIFFERENTIAL/PLATELET
BASOS ABS: 0 10*3/uL (ref 0.0–0.1)
Basophils Relative: 0 % (ref 0–1)
Eosinophils Absolute: 0 10*3/uL (ref 0.0–0.7)
Eosinophils Relative: 0 % (ref 0–5)
HCT: 27 % — ABNORMAL LOW (ref 36.0–46.0)
Hemoglobin: 9 g/dL — ABNORMAL LOW (ref 12.0–15.0)
LYMPHS ABS: 1.4 10*3/uL (ref 0.7–4.0)
Lymphocytes Relative: 9 % — ABNORMAL LOW (ref 12–46)
MCH: 29.6 pg (ref 26.0–34.0)
MCHC: 33.3 g/dL (ref 30.0–36.0)
MCV: 88.8 fL (ref 78.0–100.0)
Monocytes Absolute: 0.5 10*3/uL (ref 0.1–1.0)
Monocytes Relative: 3 % (ref 3–12)
Neutro Abs: 14.1 10*3/uL — ABNORMAL HIGH (ref 1.7–7.7)
Neutrophils Relative %: 88 % — ABNORMAL HIGH (ref 43–77)
Platelets: 208 10*3/uL (ref 150–400)
RBC: 3.04 MIL/uL — AB (ref 3.87–5.11)
RDW: 13.8 % (ref 11.5–15.5)
WBC: 16 10*3/uL — AB (ref 4.0–10.5)

## 2014-10-10 LAB — MRSA PCR SCREENING: MRSA BY PCR: NEGATIVE

## 2014-10-10 LAB — BASIC METABOLIC PANEL
ANION GAP: 9 (ref 5–15)
BUN: 17 mg/dL (ref 6–20)
CO2: 31 mmol/L (ref 22–32)
Calcium: 9 mg/dL (ref 8.9–10.3)
Chloride: 99 mmol/L — ABNORMAL LOW (ref 101–111)
Creatinine, Ser: 0.75 mg/dL (ref 0.44–1.00)
GFR calc non Af Amer: >60 mL/min (ref >60)
GLUCOSE: 106 mg/dL — AB (ref 65–99)
Potassium: 3.4 mmol/L — ABNORMAL LOW (ref 3.5–5.1)
SODIUM: 139 mmol/L (ref 135–145)

## 2014-10-10 LAB — COMPREHENSIVE METABOLIC PANEL
ALBUMIN: 1.4 g/dL — AB (ref 3.5–5.0)
ALT: 12 U/L — ABNORMAL LOW (ref 14–54)
ANION GAP: 7 (ref 5–15)
AST: 19 U/L (ref 15–41)
Alkaline Phosphatase: 72 U/L (ref 38–126)
BUN: 16 mg/dL (ref 6–20)
CALCIUM: 7.3 mg/dL — AB (ref 8.9–10.3)
CO2: 20 mmol/L — AB (ref 22–32)
Chloride: 113 mmol/L — ABNORMAL HIGH (ref 101–111)
Creatinine, Ser: 1.63 mg/dL — ABNORMAL HIGH (ref 0.44–1.00)
GFR calc Af Amer: 43 mL/min — ABNORMAL LOW (ref >60)
GFR calc non Af Amer: 37 mL/min — ABNORMAL LOW (ref >60)
GLUCOSE: 118 mg/dL — AB (ref 65–99)
Potassium: 3.7 mmol/L (ref 3.5–5.1)
SODIUM: 140 mmol/L (ref 135–145)
Total Bilirubin: 0.3 mg/dL (ref 0.3–1.2)
Total Protein: 4.4 g/dL — ABNORMAL LOW (ref 6.5–8.1)

## 2014-10-10 LAB — PROTIME-INR
INR: 1.11 (ref 0.00–1.49)
Prothrombin Time: 14.4 seconds (ref 11.6–15.2)

## 2014-10-10 LAB — LACTIC ACID, PLASMA
Lactic Acid, Venous: 1.5 mmol/L (ref 0.5–2.0)
Lactic Acid, Venous: 1.4 mmol/L (ref 0.5–2.0)

## 2014-10-10 LAB — APTT: aPTT: 36 seconds (ref 24–37)

## 2014-10-10 LAB — PROCALCITONIN: PROCALCITONIN: 0.94 ng/mL

## 2014-10-10 LAB — STREP PNEUMONIAE URINARY ANTIGEN: Strep Pneumo Urinary Antigen: NEGATIVE

## 2014-10-10 MED ORDER — LEVETIRACETAM IN NACL 1000 MG/100ML IV SOLN
1000.0000 mg | Freq: Two times a day (BID) | INTRAVENOUS | Status: DC
  Administered 2014-10-10: 1000 mg via INTRAVENOUS
  Filled 2014-10-10 (×3): qty 100

## 2014-10-10 MED ORDER — METHYLPREDNISOLONE SODIUM SUCC 40 MG IJ SOLR
40.0000 mg | Freq: Two times a day (BID) | INTRAMUSCULAR | Status: DC
  Administered 2014-10-10 – 2014-10-12 (×6): 40 mg via INTRAVENOUS
  Filled 2014-10-10 (×6): qty 1

## 2014-10-10 MED ORDER — CETYLPYRIDINIUM CHLORIDE 0.05 % MT LIQD
7.0000 mL | Freq: Two times a day (BID) | OROMUCOSAL | Status: DC
  Administered 2014-10-10 – 2014-10-19 (×19): 7 mL via OROMUCOSAL

## 2014-10-10 MED ORDER — LEVETIRACETAM 500 MG/5ML IV SOLN
1500.0000 mg | Freq: Two times a day (BID) | INTRAVENOUS | Status: DC
  Administered 2014-10-11: 1500 mg via INTRAVENOUS
  Filled 2014-10-10 (×2): qty 15

## 2014-10-10 MED ORDER — ENOXAPARIN SODIUM 40 MG/0.4ML ~~LOC~~ SOLN
40.0000 mg | SUBCUTANEOUS | Status: DC
  Administered 2014-10-10 – 2014-10-19 (×10): 40 mg via SUBCUTANEOUS
  Filled 2014-10-10 (×11): qty 0.4

## 2014-10-10 MED ORDER — SODIUM CHLORIDE 0.9 % IV SOLN
1000.0000 mg | Freq: Two times a day (BID) | INTRAVENOUS | Status: DC
  Administered 2014-10-10: 1000 mg via INTRAVENOUS
  Filled 2014-10-10 (×2): qty 10

## 2014-10-10 MED ORDER — POTASSIUM CHLORIDE IN NACL 20-0.9 MEQ/L-% IV SOLN
INTRAVENOUS | Status: AC
  Administered 2014-10-10: 02:00:00 via INTRAVENOUS

## 2014-10-10 MED ORDER — DEXTROSE 5 % IV SOLN
INTRAVENOUS | Status: AC
  Filled 2014-10-10: qty 2

## 2014-10-10 MED ORDER — LEVETIRACETAM 500 MG/5ML IV SOLN
INTRAVENOUS | Status: AC
  Filled 2014-10-10: qty 10

## 2014-10-10 MED ORDER — CEFEPIME HCL 1 G IJ SOLR
1.0000 g | Freq: Three times a day (TID) | INTRAMUSCULAR | Status: DC
  Filled 2014-10-10 (×2): qty 1

## 2014-10-10 MED ORDER — VANCOMYCIN HCL IN DEXTROSE 1-5 GM/200ML-% IV SOLN
1000.0000 mg | INTRAVENOUS | Status: DC
  Administered 2014-10-11 – 2014-10-15 (×5): 1000 mg via INTRAVENOUS
  Filled 2014-10-10 (×7): qty 200

## 2014-10-10 MED ORDER — POTASSIUM CHLORIDE IN NACL 20-0.9 MEQ/L-% IV SOLN
INTRAVENOUS | Status: DC
  Administered 2014-10-11 – 2014-10-16 (×6): via INTRAVENOUS

## 2014-10-10 MED ORDER — DEXTROSE 5 % IV SOLN
2.0000 g | INTRAVENOUS | Status: DC
  Administered 2014-10-10 – 2014-10-19 (×10): 2 g via INTRAVENOUS
  Filled 2014-10-10 (×11): qty 2

## 2014-10-10 NOTE — Care Management Note (Signed)
Case Management Note  Patient Details  Name: [name] MRN: [id number] Date of Birth: 09-20-1965   Expected Discharge Date:                  Expected Discharge Plan:  [place]  In-House Referral:  NA  Discharge planning Services  CM Consult  Post Acute Care Choice:  Resumption of Svcs/PTA Provider Choice offered to:     DME Arranged:    DME Agency:     HH Arranged:    [place] Agency:     Status of Service:  In process, will continue to follow  Medicare Important Message Given:    Date Medicare IM Given:    Medicare IM give by:    Date Additional Medicare IM Given:    Additional Medicare Important Message give by:     If discussed at [place] of Stay Meetings, dates discussed:    Additional Comments: Pt is from home, lives with son who is her primary caregiver. Pt is wheelchair bound, has electric wheelchair and lift at home. Pt has hospital bed and BSC. Pt has home O2 through [facility]. Pt has [place] RN that comes weekly through [facility]. Pt plans to return home with self care and order to resume [facility] services. No CM needs anticipated. Will cont to follow.   Sherald Barge, RN 10/10/2014, 4:10 PM

## 2014-10-10 NOTE — Progress Notes (Signed)
EEG Completed; Results Pending  

## 2014-10-10 NOTE — Progress Notes (Signed)
Subjective: Patient was admitted yesterday due to change in mental status. She was found to have sepsis due to UTI and pneumonia. She is started on combinations of antibiotics. Patient is better tody. She is opening her eyes and trying to resp[ond to very communications.  Objective: Vital signs in last 24 hours: Temp:  [98.3 F (36.8 C)-100.3 F (37.9 C)] 98.3 F (36.8 C) (05/16 0816) Pulse Rate:  [67-135] 67 (05/16 0600) Resp:  [6-20] 7 (05/16 0600) BP: (86-151)/(57-93) 112/63 mmHg (05/16 0600) SpO2:  [91 %-100 %] 100 % (05/16 0600) Weight:  [73.4 kg (161 lb 13.1 oz)-80.4 kg (177 lb 4 oz)] 80.4 kg (177 lb 4 oz) (05/16 0207) Weight change:     Intake/Output from previous day: 05/15 0701 - 05/16 0700 In: 147.5 [I.V.:147.5] Out: 825 [Urine:825]  PHYSICAL EXAM General appearance: delirious and no distress Resp: diminished breath sounds bilaterally and rhonchi bilaterally Cardio: S1, S2 normal GI: soft, non-tender; bowel sounds normal; no masses,  no organomegaly Extremities: paraplegic  Lab Results:  Results for orders placed or performed during the hospital encounter of 10/09/14 (from the past 48 hour(s))  Comprehensive metabolic panel     Status: Abnormal   Collection Time: 10/09/14  9:10 PM  Result Value Ref Range   Sodium 139 135 - 145 mmol/L   Potassium 3.4 (L) 3.5 - 5.1 mmol/L   Chloride 111 101 - 111 mmol/L   CO2 21 (L) 22 - 32 mmol/L   Glucose, Bld 110 (H) 65 - 99 mg/dL   BUN 17 6 - 20 mg/dL   Creatinine, Ser 1.63 (H) 0.44 - 1.00 mg/dL   Calcium 7.7 (L) 8.9 - 10.3 mg/dL   Total Protein 5.2 (L) 6.5 - 8.1 g/dL   Albumin 1.8 (L) 3.5 - 5.0 g/dL   AST 18 15 - 41 U/L   ALT 13 (L) 14 - 54 U/L   Alkaline Phosphatase 82 38 - 126 U/L   Total Bilirubin 0.4 0.3 - 1.2 mg/dL   GFR calc non Af Amer 37 (L) >60 mL/min   GFR calc Af Amer 43 (L) >60 mL/min    Comment: (NOTE) The eGFR has been calculated using the CKD EPI equation. This calculation has not been validated in all  clinical situations. eGFR's persistently <60 mL/min signify possible Chronic Kidney Disease.    Anion gap 7 5 - 15  CBC with Differential/Platelet     Status: Abnormal   Collection Time: 10/09/14  9:10 PM  Result Value Ref Range   WBC 19.6 (H) 4.0 - 10.5 K/uL   RBC 3.50 (L) 3.87 - 5.11 MIL/uL   Hemoglobin 10.4 (L) 12.0 - 15.0 g/dL   HCT 30.9 (L) 36.0 - 46.0 %   MCV 88.3 78.0 - 100.0 fL   MCH 29.7 26.0 - 34.0 pg   MCHC 33.7 30.0 - 36.0 g/dL   RDW 13.8 11.5 - 15.5 %   Platelets 228 150 - 400 K/uL   Neutrophils Relative % 91 (H) 43 - 77 %   Neutro Abs 17.8 (H) 1.7 - 7.7 K/uL   Lymphocytes Relative 6 (L) 12 - 46 %   Lymphs Abs 1.2 0.7 - 4.0 K/uL   Monocytes Relative 3 3 - 12 %   Monocytes Absolute 0.6 0.1 - 1.0 K/uL   Eosinophils Relative 0 0 - 5 %   Eosinophils Absolute 0.0 0.0 - 0.7 K/uL   Basophils Relative 0 0 - 1 %   Basophils Absolute 0.0 0.0 - 0.1 K/uL  Lipase, blood     Status: Abnormal   Collection Time: 10/09/14  9:10 PM  Result Value Ref Range   Lipase 20 (L) 22 - 51 U/L  Urinalysis, Routine w reflex microscopic     Status: Abnormal   Collection Time: 10/09/14  9:22 PM  Result Value Ref Range   Color, Urine YELLOW YELLOW   APPearance HAZY (A) CLEAR   Specific Gravity, Urine 1.015 1.005 - 1.030   pH 7.5 5.0 - 8.0   Glucose, UA 100 (A) NEGATIVE mg/dL   Hgb urine dipstick LARGE (A) NEGATIVE   Bilirubin Urine NEGATIVE NEGATIVE   Ketones, ur NEGATIVE NEGATIVE mg/dL   Protein, ur 100 (A) NEGATIVE mg/dL   Urobilinogen, UA 0.2 0.0 - 1.0 mg/dL   Nitrite POSITIVE (A) NEGATIVE   Leukocytes, UA SMALL (A) NEGATIVE  Urine microscopic-add on     Status: Abnormal   Collection Time: 10/09/14  9:22 PM  Result Value Ref Range   Squamous Epithelial / LPF FEW (A) RARE   WBC, UA 7-10 <3 WBC/hpf   RBC / HPF 7-10 <3 RBC/hpf   Bacteria, UA MANY (A) RARE   Urine-Other MANY YEAST   Blood culture (routine x 2)     Status: None (Preliminary result)   Collection Time: 10/09/14  9:28  PM  Result Value Ref Range   Specimen Description BLOOD RIGHT ARM    Special Requests BOTTLES DRAWN AEROBIC AND ANAEROBIC 6CC    Culture PENDING    Report Status PENDING   Blood culture (routine x 2)     Status: None (Preliminary result)   Collection Time: 10/09/14  9:42 PM  Result Value Ref Range   Specimen Description BLOOD RIGHT HAND    Special Requests BOTTLES DRAWN AEROBIC ONLY 6CC    Culture PENDING    Report Status PENDING   Lactic acid, plasma     Status: None   Collection Time: 10/09/14  9:42 PM  Result Value Ref Range   Lactic Acid, Venous 1.3 0.5 - 2.0 mmol/L  Procalcitonin - Baseline     Status: None   Collection Time: 10/09/14 10:10 PM  Result Value Ref Range   Procalcitonin 0.94 ng/mL    Comment:        Interpretation: PCT > 0.5 ng/mL and <= 2 ng/mL: Systemic infection (sepsis) is possible, but other conditions are known to elevate PCT as well. (NOTE)         ICU PCT Algorithm               Non ICU PCT Algorithm    ----------------------------     ------------------------------         PCT < 0.25 ng/mL                 PCT < 0.1 ng/mL     Stopping of antibiotics            Stopping of antibiotics       strongly encouraged.               strongly encouraged.    ----------------------------     ------------------------------       PCT level decrease by               PCT < 0.25 ng/mL       >= 80% from peak PCT       OR PCT 0.25 - 0.5 ng/mL          Stopping of antibiotics  encouraged.     Stopping of antibiotics           encouraged.    ----------------------------     ------------------------------       PCT level decrease by              PCT >= 0.25 ng/mL       < 80% from peak PCT        AND PCT >= 0.5 ng/mL             Continuing antibiotics                                              encouraged.       Continuing antibiotics            encouraged.    ----------------------------      ------------------------------     PCT level increase compared          PCT > 0.5 ng/mL         with peak PCT AND          PCT >= 0.5 ng/mL             Escalation of antibiotics                                          strongly encouraged.      Escalation of antibiotics        strongly encouraged.   MRSA PCR Screening     Status: None   Collection Time: 10/10/14  1:59 AM  Result Value Ref Range   MRSA by PCR NEGATIVE NEGATIVE    Comment:        The GeneXpert MRSA Assay (FDA approved for NASAL specimens only), is one component of a comprehensive MRSA colonization surveillance program. It is not intended to diagnose MRSA infection nor to guide or monitor treatment for MRSA infections.   Protime-INR     Status: None   Collection Time: 10/10/14  3:04 AM  Result Value Ref Range   Prothrombin Time 14.4 11.6 - 15.2 seconds   INR 1.11 0.00 - 1.49  APTT     Status: None   Collection Time: 10/10/14  3:04 AM  Result Value Ref Range   aPTT 36 24 - 37 seconds  Comprehensive metabolic panel     Status: Abnormal   Collection Time: 10/10/14  3:04 AM  Result Value Ref Range   Sodium 140 135 - 145 mmol/L   Potassium 3.7 3.5 - 5.1 mmol/L   Chloride 113 (H) 101 - 111 mmol/L   CO2 20 (L) 22 - 32 mmol/L   Glucose, Bld 118 (H) 65 - 99 mg/dL   BUN 16 6 - 20 mg/dL   Creatinine, Ser 1.63 (H) 0.44 - 1.00 mg/dL   Calcium 7.3 (L) 8.9 - 10.3 mg/dL   Total Protein 4.4 (L) 6.5 - 8.1 g/dL   Albumin 1.4 (L) 3.5 - 5.0 g/dL   AST 19 15 - 41 U/L   ALT 12 (L) 14 - 54 U/L   Alkaline Phosphatase 72 38 - 126 U/L   Total Bilirubin 0.3 0.3 - 1.2 mg/dL   GFR calc non Af Amer 37 (L) >60 mL/min   GFR calc Af Wyvonnia Lora  43 (L) >60 mL/min    Comment: (NOTE) The eGFR has been calculated using the CKD EPI equation. This calculation has not been validated in all clinical situations. eGFR's persistently <60 mL/min signify possible Chronic Kidney Disease.    Anion gap 7 5 - 15  CBC WITH DIFFERENTIAL     Status:  Abnormal   Collection Time: 10/10/14  3:04 AM  Result Value Ref Range   WBC 16.0 (H) 4.0 - 10.5 K/uL   RBC 3.04 (L) 3.87 - 5.11 MIL/uL   Hemoglobin 9.0 (L) 12.0 - 15.0 g/dL   HCT 27.0 (L) 36.0 - 46.0 %   MCV 88.8 78.0 - 100.0 fL   MCH 29.6 26.0 - 34.0 pg   MCHC 33.3 30.0 - 36.0 g/dL   RDW 13.8 11.5 - 15.5 %   Platelets 208 150 - 400 K/uL   Neutrophils Relative % 88 (H) 43 - 77 %   Neutro Abs 14.1 (H) 1.7 - 7.7 K/uL   Lymphocytes Relative 9 (L) 12 - 46 %   Lymphs Abs 1.4 0.7 - 4.0 K/uL   Monocytes Relative 3 3 - 12 %   Monocytes Absolute 0.5 0.1 - 1.0 K/uL   Eosinophils Relative 0 0 - 5 %   Eosinophils Absolute 0.0 0.0 - 0.7 K/uL   Basophils Relative 0 0 - 1 %   Basophils Absolute 0.0 0.0 - 0.1 K/uL  Lactic acid, plasma     Status: None   Collection Time: 10/10/14  3:04 AM  Result Value Ref Range   Lactic Acid, Venous 1.5 0.5 - 2.0 mmol/L  Lactic acid, plasma     Status: None   Collection Time: 10/10/14  6:43 AM  Result Value Ref Range   Lactic Acid, Venous 1.4 0.5 - 2.0 mmol/L    ABGS No results for input(s): PHART, PO2ART, TCO2, HCO3 in the last 72 hours.  Invalid input(s): PCO2 CULTURES Recent Results (from the past 240 hour(s))  Blood culture (routine x 2)     Status: None (Preliminary result)   Collection Time: 10/09/14  9:28 PM  Result Value Ref Range Status   Specimen Description BLOOD RIGHT ARM  Final   Special Requests BOTTLES DRAWN AEROBIC AND ANAEROBIC 6CC  Final   Culture PENDING  Incomplete   Report Status PENDING  Incomplete  Blood culture (routine x 2)     Status: None (Preliminary result)   Collection Time: 10/09/14  9:42 PM  Result Value Ref Range Status   Specimen Description BLOOD RIGHT HAND  Final   Special Requests BOTTLES DRAWN AEROBIC ONLY [place]  Final   Culture PENDING  Incomplete   Report Status PENDING  Incomplete  MRSA PCR Screening     Status: None   Collection Time: 10/10/14  1:59 AM  Result Value Ref Range Status   MRSA by PCR NEGATIVE  NEGATIVE Final    Comment:        The GeneXpert MRSA Assay (FDA approved for NASAL specimens only), is one component of a comprehensive MRSA colonization surveillance program. It is not intended to diagnose MRSA infection nor to guide or monitor treatment for MRSA infections.    Studies/Results: Ct Head Wo Contrast  10/10/2014   CLINICAL DATA:  Acute onset of fever and decreased level of consciousness. Possible seizure, with tremors and shaking. Initial encounter.  EXAM: CT HEAD WITHOUT CONTRAST  TECHNIQUE: Contiguous axial images were obtained from the base of the skull through the vertex without intravenous contrast.  COMPARISON:  CT of the head performed 05/02/2014, and MRI of the brain performed 10/04/2014  FINDINGS: There is no evidence of acute infarction, mass lesion, or intra- or extra-axial hemorrhage on CT.  Prominence of the ventricles suggests mild cortical volume loss, though as previously suggested, normal pressure hydrocephalus could have a similar appearance. 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  The posterior fossa, including the cerebellum, brainstem and fourth ventricle, is within normal limits. The basal ganglia are unremarkable in appearance. No mass effect or midline shift is seen.  There is no evidence of fracture; visualized osseous structures are unremarkable in appearance. The orbits are within normal limits. The paranasal sinuses and mastoid air cells are well-aerated. No significant soft tissue abnormalities are seen.  IMPRESSION: 1. No evidence of traumatic intracranial injury or fracture. 2. Mild cortical volume loss noted, though as previously suggested, normal pressure hydrocephalus could have a similar appearance. 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   Electronically Signed   By: Garald Balding M.D.   On: 10/10/2014 01:26   Dg Chest Port 1 View  10/09/2014   CLINICAL DATA:  Tremors. Fever for 2 days. Hypertension. Uterine cancer.  Paralysis. Lethargy.  EXAM: PORTABLE CHEST - 1 VIEW  COMPARISON:  08/26/2014  FINDINGS: Harrington rods noted. Chronic nonunited right clavicular fracture. The patient is rotated to the left on today's radiograph, reducing diagnostic sensitivity and specificity. Moderate enlargement of the cardiopericardial silhouette.  Airspace opacity in the left lower lobe obscures the left hemidiaphragm. There is mild subsegmental atelectasis along the right hemidiaphragm.  Low lung volumes.  IMPRESSION: 1. The dominant finding is considerable left lower lobe airspace opacity obscuring the left hemidiaphragm, concerning for pneumonia or atelectasis. 2. Subsegmental atelectasis at the right lung base. 3. Moderate enlargement of the cardiopericardial silhouette.   Electronically Signed   By: Van Clines M.D.   On: 10/09/2014 21:06    Medications: I have reviewed the patient's current medications.  Assesment:   Principal Problem:   Sepsis Active Problems:   Paraplegia   Suprapubic catheter   Acute renal failure   Seizure disorder   UTI (lower urinary tract infection)   Encephalopathy acute   HCAP (healthcare-associated pneumonia)   Encephalomalacia   TBI (traumatic brain injury)   Severe protein-calorie malnutrition    Plan:  Medications reviewed Continue current combinations antibiotics pending culture result Will continue IV hydration Will do pulmonary consult    LOS: 0 days   Geoge Lawrance 10/10/2014, 8:29 AM
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
## 2014-10-10 NOTE — Procedures (Signed)
Edmond A. Merlene Laughter, MD     [email or web address]           HISTORY: The patient presents with an episode of shaking suspicious for seizures. There is a baseline hx of seizures.  MEDICATIONS: Scheduled Meds: . antiseptic oral rinse  7 mL Mouth Rinse BID  . ceFEPime (MAXIPIME) IV  2 g Intravenous Q24H  . enoxaparin (LOVENOX) injection  40 mg Subcutaneous Q24H  . [START ON 10/11/2014] levETIRAcetam  1,500 mg Intravenous Q12H  . methylPREDNISolone (SOLU-MEDROL) injection  40 mg Intravenous Q12H  . [START ON 10/11/2014] vancomycin  1,000 mg Intravenous Q24H   Continuous Infusions: . 0.9 % NaCl with KCl 20 mEq / L     PRN Meds:.  Prior to Admission medications   Medication Sig Start Date End Date Taking? Authorizing Provider  amLODipine (NORVASC) 10 MG tablet Take 1 tablet (10 mg total) by mouth daily. 05/10/14  Yes Donne Hazel, MD  baclofen (LIORESAL) 20 MG tablet Take 1 tablet (20 mg total) by mouth 4 (four) times daily. Patient taking differently: Take 40 mg by mouth 4 (four) times daily.  06/14/14  Yes Melvenia Beam, MD  cloNIDine (CATAPRES) 0.2 MG tablet Take 1 tablet (0.2 mg total) by mouth 3 (three) times daily. 05/10/14  Yes Donne Hazel, MD  docusate sodium (COLACE) 100 MG capsule Take 100 mg by mouth at bedtime.   Yes Historical Provider, MD  fesoterodine (TOVIAZ) 8 MG TB24 tablet Take 8 mg by mouth daily.   Yes Historical Provider, MD  furosemide (LASIX) 40 MG tablet Take 40 mg by mouth daily.   Yes Historical Provider, MD  gabapentin (NEURONTIN) 300 MG capsule Take 300 mg by mouth 3 (three) times daily.    Yes Historical Provider, MD  levETIRAcetam (KEPPRA) 1000 MG tablet Take 1 tablet (1,000 mg total) by mouth 2 (two) times daily. Patient taking differently: Take 1,000 mg by mouth 3 (three) times daily.  05/10/14  Yes Donne Hazel, MD  metoprolol tartrate (LOPRESSOR) 25 MG tablet Take 1 tablet (25 mg total) by mouth 2 (two) times daily. 05/10/14   Yes Donne Hazel, MD  Multiple Vitamins-Minerals (MULTIVITAMIN ADULT PO) Take by mouth daily.   Yes Historical Provider, MD  pantoprazole (PROTONIX) 40 MG tablet Take 1 tablet (40 mg total) by mouth daily at 12 noon. 05/10/14  Yes Donne Hazel, MD  potassium chloride SA (K-DUR,KLOR-CON) 20 MEQ tablet Take 20 mEq by mouth daily.   Yes Historical Provider, MD  ciprofloxacin (CIPRO) 500 MG tablet Take 1 tablet (500 mg total) by mouth 2 (two) times daily. Patient not taking: Reported on 09/14/2014 08/29/14   [name], MD      ANALYSIS: A 16 channel recording using standard 10 20 measurements is conducted for 22 minutes. There is a well-formed posterior dominant rhythm that gets as high as 9.5 HZ. There is a beta activity observed in the frontal areas. Awake and drowsy activities are observed. Vertex sharp waves are observed. Photic stimulation and hyperventilation were not carried out. The patient is noted to have occasional frontal intermittent rhythmic delta activity. There is also occasional triphasic waves observed. Focal or lateral slowing is not observed. There are no epileptiform activities observed.   IMPRESSION: 1. The recording shows occasional frontal frontal intermittent rhythmic delta activity and triphasic waves. Both are typical seen in metabolic disturbances.      Jesten Cappuccio A. Merlene Laughter, M.D.  Diplomate, Tax adviser of Psychiatry and Neurology ( Neurology).

## 2014-10-10 NOTE — Progress Notes (Signed)
ANTIBIOTIC CONSULT NOTE - INITIAL  Pharmacy Consult for Vancomycin Indication: HCAP, sepsis  Allergies  Allergen Reactions  . Quinine Derivatives Other (See Comments)    Alters mental status    Patient Measurements: Height: 5' (152.4 cm) Weight: 161 lb 13.1 oz (73.4 kg) IBW/kg (Calculated) : 45.5   Vital Signs: Temp: 100.3 F (37.9 C) (05/15 2347) Temp Source: Rectal (05/15 2347) BP: 136/83 mmHg (05/16 0030) Pulse Rate: 92 (05/16 0115) Intake/Output from previous day:   Intake/Output from this shift:    Labs:  Recent Labs  10/09/14 2110  WBC 19.6*  HGB 10.4*  PLT 228  CREATININE 1.63*   Estimated Creatinine Clearance: 38.6 mL/min (by C-G formula based on Cr of 1.63). No results for input(s): VANCOTROUGH, VANCOPEAK, VANCORANDOM, GENTTROUGH, GENTPEAK, GENTRANDOM, TOBRATROUGH, TOBRAPEAK, TOBRARND, AMIKACINPEAK, AMIKACINTROU, AMIKACIN in the last 72 hours.   Microbiology: Recent Results (from the past 720 hour(s))  Blood culture (routine x 2)     Status: None (Preliminary result)   Collection Time: 10/09/14  9:28 PM  Result Value Ref Range Status   Specimen Description BLOOD RIGHT ARM  Final   Special Requests BOTTLES DRAWN AEROBIC AND ANAEROBIC 6CC  Final   Culture PENDING  Incomplete   Report Status PENDING  Incomplete  Blood culture (routine x 2)     Status: None (Preliminary result)   Collection Time: 10/09/14  9:42 PM  Result Value Ref Range Status   Specimen Description BLOOD RIGHT HAND  Final   Special Requests BOTTLES DRAWN AEROBIC ONLY [place]  Final   Culture PENDING  Incomplete   Report Status PENDING  Incomplete    Medical History: Past Medical History  Diagnosis Date  . Paraplegia (lower)   . Seizure disorder   . High blood pressure   . Suprapubic catheter   . Cancer     uterine  . Seizures   . Abnormal uterine bleeding (AUB) 06/15/2014    Medications:  Scheduled:  . ceFEPime (MAXIPIME) IV  2 g Intravenous Q24H  . enoxaparin (LOVENOX)  injection  40 mg Subcutaneous Q24H  . vancomycin  1,000 mg Intravenous Once   Assessment: 49 yr old female on Vancomycin and Cefepime for HCAP, sepsis.   Goal of Therapy:  Eradication of infection Vancomycin trough level 15-20 mcg/ml  Plan:  Start Vancomycin 1000mg  IV q24hrs and change Cefepime to 2 grams IV q24hrs. Follow up culture results  Jerrye Beavers, PharmD, BCPS Clinical Pharmacist
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
## 2014-10-11 LAB — BASIC METABOLIC PANEL
ANION GAP: 7 (ref 5–15)
BUN: 16 mg/dL (ref 6–20)
CO2: 18 mmol/L — ABNORMAL LOW (ref 22–32)
CREATININE: 1.68 mg/dL — AB (ref 0.44–1.00)
Calcium: 7.3 mg/dL — ABNORMAL LOW (ref 8.9–10.3)
Chloride: 116 mmol/L — ABNORMAL HIGH (ref 101–111)
GFR calc Af Amer: 41 mL/min — ABNORMAL LOW (ref >60)
GFR, EST NON AFRICAN AMERICAN: 36 mL/min — AB (ref >60)
Glucose, Bld: 123 mg/dL — ABNORMAL HIGH (ref 65–99)
Potassium: 3.4 mmol/L — ABNORMAL LOW (ref 3.5–5.1)
SODIUM: 141 mmol/L (ref 135–145)

## 2014-10-11 LAB — CBC
HEMATOCRIT: 28.5 % — AB (ref 36.0–46.0)
Hemoglobin: 9.5 g/dL — ABNORMAL LOW (ref 12.0–15.0)
MCH: 29.1 pg (ref 26.0–34.0)
MCHC: 33.3 g/dL (ref 30.0–36.0)
MCV: 87.4 fL (ref 78.0–100.0)
Platelets: 212 10*3/uL (ref 150–400)
RBC: 3.26 MIL/uL — AB (ref 3.87–5.11)
RDW: 13.7 % (ref 11.5–15.5)
WBC: 9.8 10*3/uL (ref 4.0–10.5)

## 2014-10-11 MED ORDER — ONDANSETRON HCL 4 MG/2ML IJ SOLN: 4.0000 mg | Freq: Four times a day (QID) | INTRAMUSCULAR | Status: DC | PRN

## 2014-10-11 MED ORDER — LEVETIRACETAM IN NACL 500 MG/100ML IV SOLN
INTRAVENOUS | Status: AC
  Filled 2014-10-11: qty 100

## 2014-10-11 MED ORDER — LEVETIRACETAM IN NACL 1500 MG/100ML IV SOLN
1500.0000 mg | Freq: Two times a day (BID) | INTRAVENOUS | Status: DC
  Administered 2014-10-11 – 2014-10-13 (×6): 1500 mg via INTRAVENOUS
  Filled 2014-10-11 (×7): qty 100

## 2014-10-11 MED ORDER — POTASSIUM CHLORIDE CRYS ER 20 MEQ PO TBCR
40.0000 meq | EXTENDED_RELEASE_TABLET | Freq: Once | ORAL | Status: AC
  Administered 2014-10-11: 40 meq via ORAL
  Filled 2014-10-11: qty 2

## 2014-10-11 MED ORDER — DEXTROSE 5 % IV SOLN
INTRAVENOUS | Status: AC
  Filled 2014-10-11: qty 2

## 2014-10-11 NOTE — Progress Notes (Signed)
Subjective: Patient is more alert and awake. She is responding to verbal communication. No fever or chills..  Objective: Vital signs in last 24 hours: Temp:  [97 F (36.1 C)-98.8 F (37.1 C)] 98.1 F (36.7 C) (05/17 0400) Pulse Rate:  [82-110] 82 (05/17 0400) Resp:  [9-20] 20 (05/17 0400) BP: (125-148)/(66-107) 128/66 mmHg (05/17 0400) SpO2:  [92 %-100 %] 99 % (05/17 0400) Weight:  [80.5 kg (177 lb 7.5 oz)] 80.5 kg (177 lb 7.5 oz) (05/17 0500) Weight change: 7.1 kg (15 lb 10.4 oz)    Intake/Output from previous day: 05/16 0701 - 05/17 0700 In: -  Out: 1975 [Urine:1975]  PHYSICAL EXAM General appearance: alert and no distress Resp: diminished breath sounds bilaterally and rhonchi bilaterally Cardio: S1, S2 normal GI: soft, non-tender; bowel sounds normal; no masses,  no organomegaly Extremities: paraplegic  Lab Results:  Results for orders placed or performed during the hospital encounter of 10/09/14 (from the past 48 hour(s))  Comprehensive metabolic panel     Status: Abnormal   Collection Time: 10/09/14  9:10 PM  Result Value Ref Range   Sodium 139 135 - 145 mmol/L   Potassium 3.4 (L) 3.5 - 5.1 mmol/L   Chloride 111 101 - 111 mmol/L   CO2 21 (L) 22 - 32 mmol/L   Glucose, Bld 110 (H) 65 - 99 mg/dL   BUN 17 6 - 20 mg/dL   Creatinine, Ser 1.63 (H) 0.44 - 1.00 mg/dL   Calcium 7.7 (L) 8.9 - 10.3 mg/dL   Total Protein 5.2 (L) 6.5 - 8.1 g/dL   Albumin 1.8 (L) 3.5 - 5.0 g/dL   AST 18 15 - 41 U/L   ALT 13 (L) 14 - 54 U/L   Alkaline Phosphatase 82 38 - 126 U/L   Total Bilirubin 0.4 0.3 - 1.2 mg/dL   GFR calc non Af Amer 37 (L) >60 mL/min   GFR calc Af Amer 43 (L) >60 mL/min    Comment: (NOTE) The eGFR has been calculated using the CKD EPI equation. This calculation has not been validated in all clinical situations. eGFR's persistently <60 mL/min signify possible Chronic Kidney Disease.    Anion gap 7 5 - 15  CBC with Differential/Platelet     Status: Abnormal    Collection Time: 10/09/14  9:10 PM  Result Value Ref Range   WBC 19.6 (H) 4.0 - 10.5 K/uL   RBC 3.50 (L) 3.87 - 5.11 MIL/uL   Hemoglobin 10.4 (L) 12.0 - 15.0 g/dL   HCT 30.9 (L) 36.0 - 46.0 %   MCV 88.3 78.0 - 100.0 fL   MCH 29.7 26.0 - 34.0 pg   MCHC 33.7 30.0 - 36.0 g/dL   RDW 13.8 11.5 - 15.5 %   Platelets 228 150 - 400 K/uL   Neutrophils Relative % 91 (H) 43 - 77 %   Neutro Abs 17.8 (H) 1.7 - 7.7 K/uL   Lymphocytes Relative 6 (L) 12 - 46 %   Lymphs Abs 1.2 0.7 - 4.0 K/uL   Monocytes Relative 3 3 - 12 %   Monocytes Absolute 0.6 0.1 - 1.0 K/uL   Eosinophils Relative 0 0 - 5 %   Eosinophils Absolute 0.0 0.0 - 0.7 K/uL   Basophils Relative 0 0 - 1 %   Basophils Absolute 0.0 0.0 - 0.1 K/uL  Lipase, blood     Status: Abnormal   Collection Time: 10/09/14  9:10 PM  Result Value Ref Range   Lipase 20 (L) 22 -  51 U/L  Urinalysis, Routine w reflex microscopic     Status: Abnormal   Collection Time: 10/09/14  9:22 PM  Result Value Ref Range   Color, Urine YELLOW YELLOW   APPearance HAZY (A) CLEAR   Specific Gravity, Urine 1.015 1.005 - 1.030   pH 7.5 5.0 - 8.0   Glucose, UA 100 (A) NEGATIVE mg/dL   Hgb urine dipstick LARGE (A) NEGATIVE   Bilirubin Urine NEGATIVE NEGATIVE   Ketones, ur NEGATIVE NEGATIVE mg/dL   Protein, ur 100 (A) NEGATIVE mg/dL   Urobilinogen, UA 0.2 0.0 - 1.0 mg/dL   Nitrite POSITIVE (A) NEGATIVE   Leukocytes, UA SMALL (A) NEGATIVE  Urine microscopic-add on     Status: Abnormal   Collection Time: 10/09/14  9:22 PM  Result Value Ref Range   Squamous Epithelial / LPF FEW (A) RARE   WBC, UA 7-10 <3 WBC/hpf   RBC / HPF 7-10 <3 RBC/hpf   Bacteria, UA MANY (A) RARE   Urine-Other MANY YEAST   Blood culture (routine x 2)     Status: None (Preliminary result)   Collection Time: 10/09/14  9:28 PM  Result Value Ref Range   Specimen Description BLOOD RIGHT ARM    Special Requests BOTTLES DRAWN AEROBIC AND ANAEROBIC 6CC    Culture NO GROWTH 1 DAY    Report Status  PENDING   Blood culture (routine x 2)     Status: None (Preliminary result)   Collection Time: 10/09/14  9:42 PM  Result Value Ref Range   Specimen Description BLOOD RIGHT HAND    Special Requests BOTTLES DRAWN AEROBIC ONLY 6CC    Culture NO GROWTH 1 DAY    Report Status PENDING   Lactic acid, plasma     Status: None   Collection Time: 10/09/14  9:42 PM  Result Value Ref Range   Lactic Acid, Venous 1.3 0.5 - 2.0 mmol/L  Procalcitonin - Baseline     Status: None   Collection Time: 10/09/14 10:10 PM  Result Value Ref Range   Procalcitonin 0.94 ng/mL    Comment:        Interpretation: PCT > 0.5 ng/mL and <= 2 ng/mL: Systemic infection (sepsis) is possible, but other conditions are known to elevate PCT as well. (NOTE)         ICU PCT Algorithm               Non ICU PCT Algorithm    ----------------------------     ------------------------------         PCT < 0.25 ng/mL                 PCT < 0.1 ng/mL     Stopping of antibiotics            Stopping of antibiotics       strongly encouraged.               strongly encouraged.    ----------------------------     ------------------------------       PCT level decrease by               PCT < 0.25 ng/mL       >= 80% from peak PCT       OR PCT 0.25 - 0.5 ng/mL          Stopping of antibiotics  encouraged.     Stopping of antibiotics           encouraged.    ----------------------------     ------------------------------       PCT level decrease by              PCT >= 0.25 ng/mL       < 80% from peak PCT        AND PCT >= 0.5 ng/mL             Continuing antibiotics                                              encouraged.       Continuing antibiotics            encouraged.    ----------------------------     ------------------------------     PCT level increase compared          PCT > 0.5 ng/mL         with peak PCT AND          PCT >= 0.5 ng/mL             Escalation of antibiotics                                           strongly encouraged.      Escalation of antibiotics        strongly encouraged.   MRSA PCR Screening     Status: None   Collection Time: 10/10/14  1:59 AM  Result Value Ref Range   MRSA by PCR NEGATIVE NEGATIVE    Comment:        The GeneXpert MRSA Assay (FDA approved for NASAL specimens only), is one component of a comprehensive MRSA colonization surveillance program. It is not intended to diagnose MRSA infection nor to guide or monitor treatment for MRSA infections.   Protime-INR     Status: None   Collection Time: 10/10/14  3:04 AM  Result Value Ref Range   Prothrombin Time 14.4 11.6 - 15.2 seconds   INR 1.11 0.00 - 1.49  APTT     Status: None   Collection Time: 10/10/14  3:04 AM  Result Value Ref Range   aPTT 36 24 - 37 seconds  Comprehensive metabolic panel     Status: Abnormal   Collection Time: 10/10/14  3:04 AM  Result Value Ref Range   Sodium 140 135 - 145 mmol/L   Potassium 3.7 3.5 - 5.1 mmol/L   Chloride 113 (H) 101 - 111 mmol/L   CO2 20 (L) 22 - 32 mmol/L   Glucose, Bld 118 (H) 65 - 99 mg/dL   BUN 16 6 - 20 mg/dL   Creatinine, Ser 1.63 (H) 0.44 - 1.00 mg/dL   Calcium 7.3 (L) 8.9 - 10.3 mg/dL   Total Protein 4.4 (L) 6.5 - 8.1 g/dL   Albumin 1.4 (L) 3.5 - 5.0 g/dL   AST 19 15 - 41 U/L   ALT 12 (L) 14 - 54 U/L   Alkaline Phosphatase 72 38 - 126 U/L   Total Bilirubin 0.3 0.3 - 1.2 mg/dL   GFR calc non Af Amer 37 (L) >60 mL/min   GFR calc Af Wyvonnia Lora  43 (L) >60 mL/min    Comment: (NOTE) The eGFR has been calculated using the CKD EPI equation. This calculation has not been validated in all clinical situations. eGFR's persistently <60 mL/min signify possible Chronic Kidney Disease.    Anion gap 7 5 - 15  CBC WITH DIFFERENTIAL     Status: Abnormal   Collection Time: 10/10/14  3:04 AM  Result Value Ref Range   WBC 16.0 (H) 4.0 - 10.5 K/uL   RBC 3.04 (L) 3.87 - 5.11 MIL/uL   Hemoglobin 9.0 (L) 12.0 - 15.0 g/dL   HCT 27.0 (L)  36.0 - 46.0 %   MCV 88.8 78.0 - 100.0 fL   MCH 29.6 26.0 - 34.0 pg   MCHC 33.3 30.0 - 36.0 g/dL   RDW 13.8 11.5 - 15.5 %   Platelets 208 150 - 400 K/uL   Neutrophils Relative % 88 (H) 43 - 77 %   Neutro Abs 14.1 (H) 1.7 - 7.7 K/uL   Lymphocytes Relative 9 (L) 12 - 46 %   Lymphs Abs 1.4 0.7 - 4.0 K/uL   Monocytes Relative 3 3 - 12 %   Monocytes Absolute 0.5 0.1 - 1.0 K/uL   Eosinophils Relative 0 0 - 5 %   Eosinophils Absolute 0.0 0.0 - 0.7 K/uL   Basophils Relative 0 0 - 1 %   Basophils Absolute 0.0 0.0 - 0.1 K/uL  Lactic acid, plasma     Status: None   Collection Time: 10/10/14  3:04 AM  Result Value Ref Range   Lactic Acid, Venous 1.5 0.5 - 2.0 mmol/L  Strep pneumoniae urinary antigen     Status: None   Collection Time: 10/10/14  3:55 AM  Result Value Ref Range   Strep Pneumo Urinary Antigen NEGATIVE NEGATIVE    Comment: PERFORMED AT [facility]        Infection due to S. pneumoniae cannot be absolutely ruled out since the antigen present may be below the detection limit of the test. Performed at [facility]   Lactic acid, plasma     Status: None   Collection Time: 10/10/14  6:43 AM  Result Value Ref Range   Lactic Acid, Venous 1.4 0.5 - 2.0 mmol/L  Basic metabolic panel     Status: Abnormal   Collection Time: 10/11/14  5:00 AM  Result Value Ref Range   Sodium 139 135 - 145 mmol/L   Potassium 3.4 (L) 3.5 - 5.1 mmol/L   Chloride 99 (L) 101 - 111 mmol/L   CO2 31 22 - 32 mmol/L   Glucose, Bld 106 (H) 65 - 99 mg/dL   BUN 17 6 - 20 mg/dL   Creatinine, Ser 0.75 0.44 - 1.00 mg/dL   Calcium 9.0 8.9 - 10.3 mg/dL   GFR calc non Af Amer >60 >60 mL/min   GFR calc Af Amer >60 >60 mL/min    Comment: (NOTE) The eGFR has been calculated using the CKD EPI equation. This calculation has not been validated in all clinical situations. eGFR's persistently <60 mL/min signify possible Chronic Kidney Disease.    Anion gap 9 5 - 15  Basic metabolic panel     Status:  Abnormal   Collection Time: 10/11/14  6:15 AM  Result Value Ref Range   Sodium 141 135 - 145 mmol/L   Potassium 3.4 (L) 3.5 - 5.1 mmol/L   Chloride 116 (H) 101 - 111 mmol/L   CO2 18 (L) 22 - 32 mmol/L   Glucose,  Bld 123 (H) 65 - 99 mg/dL   BUN 16 6 - 20 mg/dL   Creatinine, Ser 1.68 (H) 0.44 - 1.00 mg/dL   Calcium 7.3 (L) 8.9 - 10.3 mg/dL   GFR calc non Af Amer 36 (L) >60 mL/min   GFR calc Af Amer 41 (L) >60 mL/min    Comment: (NOTE) The eGFR has been calculated using the CKD EPI equation. This calculation has not been validated in all clinical situations. eGFR's persistently <60 mL/min signify possible Chronic Kidney Disease.    Anion gap 7 5 - 15  CBC     Status: Abnormal   Collection Time: 10/11/14  6:15 AM  Result Value Ref Range   WBC 9.8 4.0 - 10.5 K/uL   RBC 3.26 (L) 3.87 - 5.11 MIL/uL   Hemoglobin 9.5 (L) 12.0 - 15.0 g/dL   HCT 28.5 (L) 36.0 - 46.0 %   MCV 87.4 78.0 - 100.0 fL   MCH 29.1 26.0 - 34.0 pg   MCHC 33.3 30.0 - 36.0 g/dL   RDW 13.7 11.5 - 15.5 %   Platelets 212 150 - 400 K/uL    ABGS No results for input(s): PHART, PO2ART, TCO2, HCO3 in the last 72 hours.  Invalid input(s): PCO2 CULTURES Recent Results (from the past 240 hour(s))  Blood culture (routine x 2)     Status: None (Preliminary result)   Collection Time: 10/09/14  9:28 PM  Result Value Ref Range Status   Specimen Description BLOOD RIGHT ARM  Final   Special Requests BOTTLES DRAWN AEROBIC AND ANAEROBIC 6CC  Final   Culture NO GROWTH 1 DAY  Final   Report Status PENDING  Incomplete  Blood culture (routine x 2)     Status: None (Preliminary result)   Collection Time: 10/09/14  9:42 PM  Result Value Ref Range Status   Specimen Description BLOOD RIGHT HAND  Final   Special Requests BOTTLES DRAWN AEROBIC ONLY 6CC  Final   Culture NO GROWTH 1 DAY  Final   Report Status PENDING  Incomplete  MRSA PCR Screening     Status: None   Collection Time: 10/10/14  1:59 AM  Result Value Ref Range Status    MRSA by PCR NEGATIVE NEGATIVE Final    Comment:        The GeneXpert MRSA Assay (FDA approved for NASAL specimens only), is one component of a comprehensive MRSA colonization surveillance program. It is not intended to diagnose MRSA infection nor to guide or monitor treatment for MRSA infections.    Studies/Results: Ct Head Wo Contrast  10/10/2014   CLINICAL DATA:  Acute onset of fever and decreased level of consciousness. Possible seizure, with tremors and shaking. Initial encounter.  EXAM: CT HEAD WITHOUT CONTRAST  TECHNIQUE: Contiguous axial images were obtained from the base of the skull through the vertex without intravenous contrast.  COMPARISON:  CT of the head performed 05/02/2014, and MRI of the brain performed 10/04/2014  FINDINGS: There is no evidence of acute infarction, mass lesion, or intra- or extra-axial hemorrhage on CT.  Prominence of the ventricles suggests mild cortical volume loss, though as previously suggested, normal pressure hydrocephalus could have a similar appearance. 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  The posterior fossa, including the cerebellum, brainstem and fourth ventricle, is within normal limits. The basal ganglia are unremarkable in appearance. No mass effect or midline shift is seen.  There is no evidence of fracture; visualized osseous structures are unremarkable in  appearance. The orbits are within normal limits. The paranasal sinuses and mastoid air cells are well-aerated. No significant soft tissue abnormalities are seen.  IMPRESSION: 1. No evidence of traumatic intracranial injury or fracture. 2. Mild cortical volume loss noted, though as previously suggested, normal pressure hydrocephalus could have a similar appearance. 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   Electronically Signed   By: Garald Balding M.D.   On: 10/10/2014 01:26   Dg Chest Port 1 View  10/09/2014   CLINICAL DATA:  Tremors. Fever for 2 days.  Hypertension. Uterine cancer. Paralysis. Lethargy.  EXAM: PORTABLE CHEST - 1 VIEW  COMPARISON:  08/26/2014  FINDINGS: Harrington rods noted. Chronic nonunited right clavicular fracture. The patient is rotated to the left on today's radiograph, reducing diagnostic sensitivity and specificity. Moderate enlargement of the cardiopericardial silhouette.  Airspace opacity in the left lower lobe obscures the left hemidiaphragm. There is mild subsegmental atelectasis along the right hemidiaphragm.  Low lung volumes.  IMPRESSION: 1. The dominant finding is considerable left lower lobe airspace opacity obscuring the left hemidiaphragm, concerning for pneumonia or atelectasis. 2. Subsegmental atelectasis at the right lung base. 3. Moderate enlargement of the cardiopericardial silhouette.   Electronically Signed   By: Van Clines M.D.   On: 10/09/2014 21:06    Medications: I have reviewed the patient's current medications.  Assesment:   Principal Problem:   Sepsis Active Problems:   Paraplegia   Suprapubic catheter   Acute renal failure   Seizure disorder   UTI (lower urinary tract infection)   Encephalopathy acute   HCAP (healthcare-associated pneumonia)   Encephalomalacia   TBI (traumatic brain injury)   Severe protein-calorie malnutrition    Plan:  Medications reviewed Continue IV antibiotics Pulmonary and neurology consult apprerciated Will supplement K+    LOS: 1 day   Shonia Skilling 10/11/2014, 7:25 AM
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
## 2014-10-11 NOTE — Progress Notes (Signed)
Subjective: She looks much better today. She is more awake and alert. Oxygenating at 97% now on room air.  Objective: Vital signs in last 24 hours: Temp:  [97 F (36.1 C)-98.8 F (37.1 C)] 98.1 F (36.7 C) (05/17 0400) Pulse Rate:  [82-110] 82 (05/17 0400) Resp:  [9-20] 20 (05/17 0400) BP: (125-148)/(66-107) 128/66 mmHg (05/17 0400) SpO2:  [92 %-100 %] 99 % (05/17 0400) Weight:  [80.5 kg (177 lb 7.5 oz)] 80.5 kg (177 lb 7.5 oz) (05/17 0500) Weight change: 7.1 kg (15 lb 10.4 oz)    Intake/Output from previous day: 05/16 0701 - 05/17 0700 In: -  Out: 1975 [Urine:1975]  PHYSICAL EXAM General appearance: alert, cooperative and no distress Resp: rhonchi bilaterally Cardio: regular rate and rhythm, S1, S2 normal, no murmur, click, rub or gallop GI: soft, non-tender; bowel sounds normal; no masses,  no organomegaly Extremities: Paraplegic  Lab Results:  Results for orders placed or performed during the hospital encounter of 10/09/14 (from the past 48 hour(s))  Comprehensive metabolic panel     Status: Abnormal   Collection Time: 10/09/14  9:10 PM  Result Value Ref Range   Sodium 139 135 - 145 mmol/L   Potassium 3.4 (L) 3.5 - 5.1 mmol/L   Chloride 111 101 - 111 mmol/L   CO2 21 (L) 22 - 32 mmol/L   Glucose, Bld 110 (H) 65 - 99 mg/dL   BUN 17 6 - 20 mg/dL   Creatinine, Ser 1.63 (H) 0.44 - 1.00 mg/dL   Calcium 7.7 (L) 8.9 - 10.3 mg/dL   Total Protein 5.2 (L) 6.5 - 8.1 g/dL   Albumin 1.8 (L) 3.5 - 5.0 g/dL   AST 18 15 - 41 U/L   ALT 13 (L) 14 - 54 U/L   Alkaline Phosphatase 82 38 - 126 U/L   Total Bilirubin 0.4 0.3 - 1.2 mg/dL   GFR calc non Af Amer 37 (L) >60 mL/min   GFR calc Af Amer 43 (L) >60 mL/min    Comment: (NOTE) The eGFR has been calculated using the CKD EPI equation. This calculation has not been validated in all clinical situations. eGFR's persistently <60 mL/min signify possible Chronic Kidney Disease.    Anion gap 7 5 - 15  CBC with Differential/Platelet      Status: Abnormal   Collection Time: 10/09/14  9:10 PM  Result Value Ref Range   WBC 19.6 (H) 4.0 - 10.5 K/uL   RBC 3.50 (L) 3.87 - 5.11 MIL/uL   Hemoglobin 10.4 (L) 12.0 - 15.0 g/dL   HCT 30.9 (L) 36.0 - 46.0 %   MCV 88.3 78.0 - 100.0 fL   MCH 29.7 26.0 - 34.0 pg   MCHC 33.7 30.0 - 36.0 g/dL   RDW 13.8 11.5 - 15.5 %   Platelets 228 150 - 400 K/uL   Neutrophils Relative % 91 (H) 43 - 77 %   Neutro Abs 17.8 (H) 1.7 - 7.7 K/uL   Lymphocytes Relative 6 (L) 12 - 46 %   Lymphs Abs 1.2 0.7 - 4.0 K/uL   Monocytes Relative 3 3 - 12 %   Monocytes Absolute 0.6 0.1 - 1.0 K/uL   Eosinophils Relative 0 0 - 5 %   Eosinophils Absolute 0.0 0.0 - 0.7 K/uL   Basophils Relative 0 0 - 1 %   Basophils Absolute 0.0 0.0 - 0.1 K/uL  Lipase, blood     Status: Abnormal   Collection Time: 10/09/14  9:10 PM  Result Value Ref  Range   Lipase 20 (L) 22 - 51 U/L  Urinalysis, Routine w reflex microscopic     Status: Abnormal   Collection Time: 10/09/14  9:22 PM  Result Value Ref Range   Color, Urine YELLOW YELLOW   APPearance HAZY (A) CLEAR   Specific Gravity, Urine 1.015 1.005 - 1.030   pH 7.5 5.0 - 8.0   Glucose, UA 100 (A) NEGATIVE mg/dL   Hgb urine dipstick LARGE (A) NEGATIVE   Bilirubin Urine NEGATIVE NEGATIVE   Ketones, ur NEGATIVE NEGATIVE mg/dL   Protein, ur 100 (A) NEGATIVE mg/dL   Urobilinogen, UA 0.2 0.0 - 1.0 mg/dL   Nitrite POSITIVE (A) NEGATIVE   Leukocytes, UA SMALL (A) NEGATIVE  Urine microscopic-add on     Status: Abnormal   Collection Time: 10/09/14  9:22 PM  Result Value Ref Range   Squamous Epithelial / LPF FEW (A) RARE   WBC, UA 7-10 <3 WBC/hpf   RBC / HPF 7-10 <3 RBC/hpf   Bacteria, UA MANY (A) RARE   Urine-Other MANY YEAST   Blood culture (routine x 2)     Status: None (Preliminary result)   Collection Time: 10/09/14  9:28 PM  Result Value Ref Range   Specimen Description BLOOD RIGHT ARM    Special Requests BOTTLES DRAWN AEROBIC AND ANAEROBIC 6CC    Culture NO GROWTH 1 DAY     Report Status PENDING   Blood culture (routine x 2)     Status: None (Preliminary result)   Collection Time: 10/09/14  9:42 PM  Result Value Ref Range   Specimen Description BLOOD RIGHT HAND    Special Requests BOTTLES DRAWN AEROBIC ONLY 6CC    Culture NO GROWTH 1 DAY    Report Status PENDING   Lactic acid, plasma     Status: None   Collection Time: 10/09/14  9:42 PM  Result Value Ref Range   Lactic Acid, Venous 1.3 0.5 - 2.0 mmol/L  Procalcitonin - Baseline     Status: None   Collection Time: 10/09/14 10:10 PM  Result Value Ref Range   Procalcitonin 0.94 ng/mL    Comment:        Interpretation: PCT > 0.5 ng/mL and <= 2 ng/mL: Systemic infection (sepsis) is possible, but other conditions are known to elevate PCT as well. (NOTE)         ICU PCT Algorithm               Non ICU PCT Algorithm    ----------------------------     ------------------------------         PCT < 0.25 ng/mL                 PCT < 0.1 ng/mL     Stopping of antibiotics            Stopping of antibiotics       strongly encouraged.               strongly encouraged.    ----------------------------     ------------------------------       PCT level decrease by               PCT < 0.25 ng/mL       >= 80% from peak PCT       OR PCT 0.25 - 0.5 ng/mL          Stopping of antibiotics  encouraged.     Stopping of antibiotics           encouraged.    ----------------------------     ------------------------------       PCT level decrease by              PCT >= 0.25 ng/mL       < 80% from peak PCT        AND PCT >= 0.5 ng/mL             Continuing antibiotics                                              encouraged.       Continuing antibiotics            encouraged.    ----------------------------     ------------------------------     PCT level increase compared          PCT > 0.5 ng/mL         with peak PCT AND          PCT >= 0.5 ng/mL             Escalation of  antibiotics                                          strongly encouraged.      Escalation of antibiotics        strongly encouraged.   MRSA PCR Screening     Status: None   Collection Time: 10/10/14  1:59 AM  Result Value Ref Range   MRSA by PCR NEGATIVE NEGATIVE    Comment:        The GeneXpert MRSA Assay (FDA approved for NASAL specimens only), is one component of a comprehensive MRSA colonization surveillance program. It is not intended to diagnose MRSA infection nor to guide or monitor treatment for MRSA infections.   Protime-INR     Status: None   Collection Time: 10/10/14  3:04 AM  Result Value Ref Range   Prothrombin Time 14.4 11.6 - 15.2 seconds   INR 1.11 0.00 - 1.49  APTT     Status: None   Collection Time: 10/10/14  3:04 AM  Result Value Ref Range   aPTT 36 24 - 37 seconds  Comprehensive metabolic panel     Status: Abnormal   Collection Time: 10/10/14  3:04 AM  Result Value Ref Range   Sodium 140 135 - 145 mmol/L   Potassium 3.7 3.5 - 5.1 mmol/L   Chloride 113 (H) 101 - 111 mmol/L   CO2 20 (L) 22 - 32 mmol/L   Glucose, Bld 118 (H) 65 - 99 mg/dL   BUN 16 6 - 20 mg/dL   Creatinine, Ser 1.63 (H) 0.44 - 1.00 mg/dL   Calcium 7.3 (L) 8.9 - 10.3 mg/dL   Total Protein 4.4 (L) 6.5 - 8.1 g/dL   Albumin 1.4 (L) 3.5 - 5.0 g/dL   AST 19 15 - 41 U/L   ALT 12 (L) 14 - 54 U/L   Alkaline Phosphatase 72 38 - 126 U/L   Total Bilirubin 0.3 0.3 - 1.2 mg/dL   GFR calc non Af Amer 37 (L) >60 mL/min   GFR calc Af Wyvonnia Lora  43 (L) >60 mL/min    Comment: (NOTE) The eGFR has been calculated using the CKD EPI equation. This calculation has not been validated in all clinical situations. eGFR's persistently <60 mL/min signify possible Chronic Kidney Disease.    Anion gap 7 5 - 15  CBC WITH DIFFERENTIAL     Status: Abnormal   Collection Time: 10/10/14  3:04 AM  Result Value Ref Range   WBC 16.0 (H) 4.0 - 10.5 K/uL   RBC 3.04 (L) 3.87 - 5.11 MIL/uL   Hemoglobin 9.0 (L) 12.0 - 15.0  g/dL   HCT 27.0 (L) 36.0 - 46.0 %   MCV 88.8 78.0 - 100.0 fL   MCH 29.6 26.0 - 34.0 pg   MCHC 33.3 30.0 - 36.0 g/dL   RDW 13.8 11.5 - 15.5 %   Platelets 208 150 - 400 K/uL   Neutrophils Relative % 88 (H) 43 - 77 %   Neutro Abs 14.1 (H) 1.7 - 7.7 K/uL   Lymphocytes Relative 9 (L) 12 - 46 %   Lymphs Abs 1.4 0.7 - 4.0 K/uL   Monocytes Relative 3 3 - 12 %   Monocytes Absolute 0.5 0.1 - 1.0 K/uL   Eosinophils Relative 0 0 - 5 %   Eosinophils Absolute 0.0 0.0 - 0.7 K/uL   Basophils Relative 0 0 - 1 %   Basophils Absolute 0.0 0.0 - 0.1 K/uL  Lactic acid, plasma     Status: None   Collection Time: 10/10/14  3:04 AM  Result Value Ref Range   Lactic Acid, Venous 1.5 0.5 - 2.0 mmol/L  Strep pneumoniae urinary antigen     Status: None   Collection Time: 10/10/14  3:55 AM  Result Value Ref Range   Strep Pneumo Urinary Antigen NEGATIVE NEGATIVE    Comment: PERFORMED AT [facility]        Infection due to S. pneumoniae cannot be absolutely ruled out since the antigen present may be below the detection limit of the test. Performed at [facility]   Lactic acid, plasma     Status: None   Collection Time: 10/10/14  6:43 AM  Result Value Ref Range   Lactic Acid, Venous 1.4 0.5 - 2.0 mmol/L  Basic metabolic panel     Status: Abnormal   Collection Time: 10/11/14  5:00 AM  Result Value Ref Range   Sodium 139 135 - 145 mmol/L   Potassium 3.4 (L) 3.5 - 5.1 mmol/L   Chloride 99 (L) 101 - 111 mmol/L   CO2 31 22 - 32 mmol/L   Glucose, Bld 106 (H) 65 - 99 mg/dL   BUN 17 6 - 20 mg/dL   Creatinine, Ser 0.75 0.44 - 1.00 mg/dL   Calcium 9.0 8.9 - 10.3 mg/dL   GFR calc non Af Amer >60 >60 mL/min   GFR calc Af Amer >60 >60 mL/min    Comment: (NOTE) The eGFR has been calculated using the CKD EPI equation. This calculation has not been validated in all clinical situations. eGFR's persistently <60 mL/min signify possible Chronic Kidney Disease.    Anion gap 9 5 - 15  Basic  metabolic panel     Status: Abnormal   Collection Time: 10/11/14  6:15 AM  Result Value Ref Range   Sodium 141 135 - 145 mmol/L   Potassium 3.4 (L) 3.5 - 5.1 mmol/L   Chloride 116 (H) 101 - 111 mmol/L   CO2 18 (L) 22 - 32 mmol/L   Glucose,  Bld 123 (H) 65 - 99 mg/dL   BUN 16 6 - 20 mg/dL   Creatinine, Ser 1.68 (H) 0.44 - 1.00 mg/dL   Calcium 7.3 (L) 8.9 - 10.3 mg/dL   GFR calc non Af Amer 36 (L) >60 mL/min   GFR calc Af Amer 41 (L) >60 mL/min    Comment: (NOTE) The eGFR has been calculated using the CKD EPI equation. This calculation has not been validated in all clinical situations. eGFR's persistently <60 mL/min signify possible Chronic Kidney Disease.    Anion gap 7 5 - 15  CBC     Status: Abnormal   Collection Time: 10/11/14  6:15 AM  Result Value Ref Range   WBC 9.8 4.0 - 10.5 K/uL   RBC 3.26 (L) 3.87 - 5.11 MIL/uL   Hemoglobin 9.5 (L) 12.0 - 15.0 g/dL   HCT 28.5 (L) 36.0 - 46.0 %   MCV 87.4 78.0 - 100.0 fL   MCH 29.1 26.0 - 34.0 pg   MCHC 33.3 30.0 - 36.0 g/dL   RDW 13.7 11.5 - 15.5 %   Platelets 212 150 - 400 K/uL    ABGS No results for input(s): PHART, PO2ART, TCO2, HCO3 in the last 72 hours.  Invalid input(s): PCO2 CULTURES Recent Results (from the past 240 hour(s))  Blood culture (routine x 2)     Status: None (Preliminary result)   Collection Time: 10/09/14  9:28 PM  Result Value Ref Range Status   Specimen Description BLOOD RIGHT ARM  Final   Special Requests BOTTLES DRAWN AEROBIC AND ANAEROBIC 6CC  Final   Culture NO GROWTH 1 DAY  Final   Report Status PENDING  Incomplete  Blood culture (routine x 2)     Status: None (Preliminary result)   Collection Time: 10/09/14  9:42 PM  Result Value Ref Range Status   Specimen Description BLOOD RIGHT HAND  Final   Special Requests BOTTLES DRAWN AEROBIC ONLY 6CC  Final   Culture NO GROWTH 1 DAY  Final   Report Status PENDING  Incomplete  MRSA PCR Screening     Status: None   Collection Time: 10/10/14  1:59 AM   Result Value Ref Range Status   MRSA by PCR NEGATIVE NEGATIVE Final    Comment:        The GeneXpert MRSA Assay (FDA approved for NASAL specimens only), is one component of a comprehensive MRSA colonization surveillance program. It is not intended to diagnose MRSA infection nor to guide or monitor treatment for MRSA infections.    Studies/Results: Ct Head Wo Contrast  10/10/2014   CLINICAL DATA:  Acute onset of fever and decreased level of consciousness. Possible seizure, with tremors and shaking. Initial encounter.  EXAM: CT HEAD WITHOUT CONTRAST  TECHNIQUE: Contiguous axial images were obtained from the base of the skull through the vertex without intravenous contrast.  COMPARISON:  CT of the head performed 05/02/2014, and MRI of the brain performed 10/04/2014  FINDINGS: There is no evidence of acute infarction, mass lesion, or intra- or extra-axial hemorrhage on CT.  Prominence of the ventricles suggests mild cortical volume loss, though as previously suggested, normal pressure hydrocephalus could have a similar appearance. 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  The posterior fossa, including the cerebellum, brainstem and fourth ventricle, is within normal limits. The basal ganglia are unremarkable in appearance. No mass effect or midline shift is seen.  There is no evidence of fracture; visualized osseous structures are unremarkable in  appearance. The orbits are within normal limits. The paranasal sinuses and mastoid air cells are well-aerated. No significant soft tissue abnormalities are seen.  IMPRESSION: 1. No evidence of traumatic intracranial injury or fracture. 2. Mild cortical volume loss noted, though as previously suggested, normal pressure hydrocephalus could have a similar appearance. 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   Electronically Signed   By: Garald Balding M.D.   On: 10/10/2014 01:26   Dg Chest Port 1 View  10/09/2014   CLINICAL DATA:   Tremors. Fever for 2 days. Hypertension. Uterine cancer. Paralysis. Lethargy.  EXAM: PORTABLE CHEST - 1 VIEW  COMPARISON:  08/26/2014  FINDINGS: Harrington rods noted. Chronic nonunited right clavicular fracture. The patient is rotated to the left on today's radiograph, reducing diagnostic sensitivity and specificity. Moderate enlargement of the cardiopericardial silhouette.  Airspace opacity in the left lower lobe obscures the left hemidiaphragm. There is mild subsegmental atelectasis along the right hemidiaphragm.  Low lung volumes.  IMPRESSION: 1. The dominant finding is considerable left lower lobe airspace opacity obscuring the left hemidiaphragm, concerning for pneumonia or atelectasis. 2. Subsegmental atelectasis at the right lung base. 3. Moderate enlargement of the cardiopericardial silhouette.   Electronically Signed   By: Van Clines M.D.   On: 10/09/2014 21:06    Medications:  Prior to Admission:  Prescriptions prior to admission  Medication Sig Dispense Refill Last Dose  . amLODipine (NORVASC) 10 MG tablet Take 1 tablet (10 mg total) by mouth daily. 30 tablet  Taking  . baclofen (LIORESAL) 20 MG tablet Take 1 tablet (20 mg total) by mouth 4 (four) times daily. (Patient taking differently: Take 40 mg by mouth 4 (four) times daily. ) 30 each 6 Taking  . cloNIDine (CATAPRES) 0.2 MG tablet Take 1 tablet (0.2 mg total) by mouth 3 (three) times daily. 60 tablet 0 Taking  . docusate sodium (COLACE) 100 MG capsule Take 100 mg by mouth at bedtime.   Taking  . fesoterodine (TOVIAZ) 8 MG TB24 tablet Take 8 mg by mouth daily.   Taking  . furosemide (LASIX) 40 MG tablet Take 40 mg by mouth daily.   Taking  . gabapentin (NEURONTIN) 300 MG capsule Take 300 mg by mouth 3 (three) times daily.    Taking  . levETIRAcetam (KEPPRA) 1000 MG tablet Take 1 tablet (1,000 mg total) by mouth 2 (two) times daily. (Patient taking differently: Take 1,000 mg by mouth 3 (three) times daily. ) 60 tablet 0 Taking   . metoprolol tartrate (LOPRESSOR) 25 MG tablet Take 1 tablet (25 mg total) by mouth 2 (two) times daily. 60 tablet 0 Taking  . Multiple Vitamins-Minerals (MULTIVITAMIN ADULT PO) Take by mouth daily.   Taking  . pantoprazole (PROTONIX) 40 MG tablet Take 1 tablet (40 mg total) by mouth daily at 12 noon. 30 tablet 0 Taking  . potassium chloride SA (K-DUR,KLOR-CON) 20 MEQ tablet Take 20 mEq by mouth daily.   Taking  . ciprofloxacin (CIPRO) 500 MG tablet Take 1 tablet (500 mg total) by mouth 2 (two) times daily. (Patient not taking: Reported on 09/14/2014) 10 tablet 0 Not Taking   Scheduled: . antiseptic oral rinse  7 mL Mouth Rinse BID  . ceFEPime (MAXIPIME) IV  2 g Intravenous Q24H  . enoxaparin (LOVENOX) injection  40 mg Subcutaneous Q24H  . levETIRAcetam  1,500 mg Intravenous Q12H  . methylPREDNISolone (SOLU-MEDROL) injection  40 mg Intravenous Q12H  . potassium chloride  40 mEq Oral Once  . vancomycin  1,000 mg Intravenous Q24H   Continuous: . 0.9 % NaCl with KCl 20 mEq / L 75 mL/hr at 10/11/14 8786   PRN:  Assesment: She was admitted with pneumonia and sepsis. She is improved. She had metabolic encephalopathy that is better. She has paraplegia and a chronic suprapubic catheter and has had urinary tract infections in the past. Principal Problem:   Sepsis Active Problems:   Paraplegia   Suprapubic catheter   Acute renal failure   Seizure disorder   UTI (lower urinary tract infection)   Encephalopathy acute   HCAP (healthcare-associated pneumonia)   Encephalomalacia   TBI (traumatic brain injury)   Severe protein-calorie malnutrition    Plan: Continue current antibiotics etc.    LOS: 1 day   Aariah Godette L 10/11/2014, 7:40 AM
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
## 2014-10-12 LAB — BASIC METABOLIC PANEL
Anion gap: 5 (ref 5–15)
BUN: 14 mg/dL (ref 6–20)
CALCIUM: 7.6 mg/dL — AB (ref 8.9–10.3)
CO2: 18 mmol/L — ABNORMAL LOW (ref 22–32)
CREATININE: 1.51 mg/dL — AB (ref 0.44–1.00)
Chloride: 117 mmol/L — ABNORMAL HIGH (ref 101–111)
GFR calc Af Amer: 47 mL/min — ABNORMAL LOW (ref >60)
GFR, EST NON AFRICAN AMERICAN: 40 mL/min — AB (ref >60)
GLUCOSE: 117 mg/dL — AB (ref 65–99)
Potassium: 3.6 mmol/L (ref 3.5–5.1)
Sodium: 140 mmol/L (ref 135–145)

## 2014-10-12 LAB — LEGIONELLA ANTIGEN, URINE

## 2014-10-12 LAB — URINE CULTURE: Colony Count: >=100000

## 2014-10-12 LAB — PROCALCITONIN: Procalcitonin: 0.42 ng/mL

## 2014-10-12 MED ORDER — DEXTROSE 5 % IV SOLN
INTRAVENOUS | Status: AC
  Filled 2014-10-12: qty 2

## 2014-10-12 NOTE — Clinical Social Work Note (Signed)
Clinical Social Work Assessment  Patient Details  Name: [name] MRN: [id number] Date of Birth: 1966-04-30  Date of referral:  10/12/14               Reason for consult:  Facility Placement                Permission sought to share information with:    Permission granted to share information::     Name::        Agency::     Relationship::     Contact Information:     Housing/Transportation Living arrangements for the past 2 months:  Apartment Source of Information:  Patient Patient Interpreter Needed:  None Criminal Activity/Legal Involvement Pertinent to Current Situation/Hospitalization:  No - Comment as needed Significant Relationships:  Adult Children Lives with:  Adult Children Do you feel safe going back to the place where you live?  Yes Need for family participation in patient care:   (Family will need to continue providing support to patient as they have been.)  Care giving concerns:  Patient indicated that her adult son assists in her care.    Social Worker assessment / plan:  CSW met with patient and discussed SNF placement. Patient stated that she uses a wheelchair and that her son assists her with her ADL's.  Patient indicated that she and her son split the bills in half and that her rent is $50/month through [address] [employer].  CSW provided a SNF list.  Patient indicated that she was not interested in placement at this time.  She stated that she did not feel that she needed any additional assistance other than what her son does for her.    Employment status:  Disabled (Comment on whether or not currently receiving Disability) Insurance information:  Medicaid [place] PT Recommendations:  Not assessed at this time Information / Referral to community resources:  [place]  Patient/Family's Response to care:  Patient does not desire to go to SNF at this time.  Patient/Family's Understanding of and Emotional Response to Diagnosis, Current Treatment, and  Prognosis:  Patient is understanding of her diagosis, treatment and prognosis.    Emotional Assessment Appearance:  Developmentally appropriate Attitude/Demeanor/Rapport:   (Cooperative) Affect (typically observed):  Calm Orientation:  Oriented to Self, Oriented to Place, Oriented to  Time, Oriented to Situation Alcohol / Substance use:  Never Used Psych involvement (Current and /or in the community):  No (Comment)  Discharge Needs  Concerns to be addressed:  No discharge needs identified Readmission within the last 30 days:  No Current discharge risk:  None Barriers to Discharge:  No Barriers Identified   Ihor Gully, LCSW 10/12/2014, 12:31 PM

## 2014-10-12 NOTE — Progress Notes (Signed)
Subjective: She seems to be getting better. She is more alert. She has no complaints of shortness of breath or cough  Objective: Vital signs in last 24 hours: Temp:  [97.8 F (36.6 C)-98.8 F (37.1 C)] 98.2 F (36.8 C) (05/18 0400) Pulse Rate:  [82-108] 84 (05/17 2000) Resp:  [12-20] 19 (05/17 2000) BP: (171-185)/(113-115) 185/113 mmHg (05/17 2000) SpO2:  [94 %-100 %] 100 % (05/17 2000) Weight:  [82.3 kg (181 lb 7 oz)] 82.3 kg (181 lb 7 oz) (05/18 0500) Weight change: 1.8 kg (3 lb 15.5 oz) Last BM Date: 10/11/14  Intake/Output from previous day: 05/17 0701 - 05/18 0700 In: 2663.3 [P.O.:240; I.V.:1673.3; IV Piggyback:750] Out: 2000 [Urine:2000]  PHYSICAL EXAM General appearance: alert, cooperative and no distress Resp: clear to auscultation bilaterally Cardio: regular rate and rhythm, S1, S2 normal, no murmur, click, rub or gallop GI: soft, non-tender; bowel sounds normal; no masses,  no organomegaly Extremities: Paraplegic  Lab Results:  Results for orders placed or performed during the hospital encounter of 10/09/14 (from the past 48 hour(s))  Basic metabolic panel     Status: Abnormal   Collection Time: 10/11/14  5:00 AM  Result Value Ref Range   Sodium 139 135 - 145 mmol/L   Potassium 3.4 (L) 3.5 - 5.1 mmol/L   Chloride 99 (L) 101 - 111 mmol/L   CO2 31 22 - 32 mmol/L   Glucose, Bld 106 (H) 65 - 99 mg/dL   BUN 17 6 - 20 mg/dL   Creatinine, Ser 0.75 0.44 - 1.00 mg/dL   Calcium 9.0 8.9 - 10.3 mg/dL   GFR calc non Af Amer >60 >60 mL/min   GFR calc Af Amer >60 >60 mL/min    Comment: (NOTE) The eGFR has been calculated using the CKD EPI equation. This calculation has not been validated in all clinical situations. eGFR's persistently <60 mL/min signify possible Chronic Kidney Disease.    Anion gap 9 5 - 15  Basic metabolic panel     Status: Abnormal   Collection Time: 10/11/14  6:15 AM  Result Value Ref Range   Sodium 141 135 - 145 mmol/L   Potassium 3.4 (L) 3.5 -  5.1 mmol/L   Chloride 116 (H) 101 - 111 mmol/L   CO2 18 (L) 22 - 32 mmol/L   Glucose, Bld 123 (H) 65 - 99 mg/dL   BUN 16 6 - 20 mg/dL   Creatinine, Ser 1.68 (H) 0.44 - 1.00 mg/dL   Calcium 7.3 (L) 8.9 - 10.3 mg/dL   GFR calc non Af Amer 36 (L) >60 mL/min   GFR calc Af Amer 41 (L) >60 mL/min    Comment: (NOTE) The eGFR has been calculated using the CKD EPI equation. This calculation has not been validated in all clinical situations. eGFR's persistently <60 mL/min signify possible Chronic Kidney Disease.    Anion gap 7 5 - 15  CBC     Status: Abnormal   Collection Time: 10/11/14  6:15 AM  Result Value Ref Range   WBC 9.8 4.0 - 10.5 K/uL   RBC 3.26 (L) 3.87 - 5.11 MIL/uL   Hemoglobin 9.5 (L) 12.0 - 15.0 g/dL   HCT 28.5 (L) 36.0 - 46.0 %   MCV 87.4 78.0 - 100.0 fL   MCH 29.1 26.0 - 34.0 pg   MCHC 33.3 30.0 - 36.0 g/dL   RDW 13.7 11.5 - 15.5 %   Platelets 212 150 - 400 K/uL  Procalcitonin     Status: None  Collection Time: 10/12/14  5:02 AM  Result Value Ref Range   Procalcitonin 0.42 ng/mL    Comment:        Interpretation: PCT (Procalcitonin) <= 0.5 ng/mL: Systemic infection (sepsis) is not likely. Local bacterial infection is possible. (NOTE)         ICU PCT Algorithm               Non ICU PCT Algorithm    ----------------------------     ------------------------------         PCT < 0.25 ng/mL                 PCT < 0.1 ng/mL     Stopping of antibiotics            Stopping of antibiotics       strongly encouraged.               strongly encouraged.    ----------------------------     ------------------------------       PCT level decrease by               PCT < 0.25 ng/mL       >= 80% from peak PCT       OR PCT 0.25 - 0.5 ng/mL          Stopping of antibiotics                                             encouraged.     Stopping of antibiotics           encouraged.    ----------------------------     ------------------------------       PCT level decrease by               PCT >= 0.25 ng/mL       < 80% from peak PCT        AND PCT >= 0.5 ng/mL            Continuin g antibiotics                                              encouraged.       Continuing antibiotics            encouraged.    ----------------------------     ------------------------------     PCT level increase compared          PCT > 0.5 ng/mL         with peak PCT AND          PCT >= 0.5 ng/mL             Escalation of antibiotics                                          strongly encouraged.      Escalation of antibiotics        strongly encouraged.   Basic metabolic panel     Status: Abnormal   Collection Time: 10/12/14  5:02 AM  Result Value Ref Range   Sodium 140 135 - 145 mmol/L   Potassium  3.6 3.5 - 5.1 mmol/L   Chloride 117 (H) 101 - 111 mmol/L   CO2 18 (L) 22 - 32 mmol/L   Glucose, Bld 117 (H) 65 - 99 mg/dL   BUN 14 6 - 20 mg/dL   Creatinine, Ser 1.51 (H) 0.44 - 1.00 mg/dL   Calcium 7.6 (L) 8.9 - 10.3 mg/dL   GFR calc non Af Amer 40 (L) >60 mL/min   GFR calc Af Amer 47 (L) >60 mL/min    Comment: (NOTE) The eGFR has been calculated using the CKD EPI equation. This calculation has not been validated in all clinical situations. eGFR's persistently <60 mL/min signify possible Chronic Kidney Disease.    Anion gap 5 5 - 15    ABGS No results for input(s): PHART, PO2ART, TCO2, HCO3 in the last 72 hours.  Invalid input(s): PCO2 CULTURES Recent Results (from the past 240 hour(s))  Blood culture (routine x 2)     Status: None (Preliminary result)   Collection Time: 10/09/14  9:28 PM  Result Value Ref Range Status   Specimen Description BLOOD RIGHT ARM  Final   Special Requests BOTTLES DRAWN AEROBIC AND ANAEROBIC 6CC  Final   Culture NO GROWTH 2 DAYS  Final   Report Status PENDING  Incomplete  Blood culture (routine x 2)     Status: None (Preliminary result)   Collection Time: 10/09/14  9:42 PM  Result Value Ref Range Status   Specimen Description BLOOD RIGHT HAND  Final    Special Requests BOTTLES DRAWN AEROBIC ONLY 6CC  Final   Culture NO GROWTH 2 DAYS  Final   Report Status PENDING  Incomplete  Urine culture     Status: None (Preliminary result)   Collection Time: 10/10/14 12:14 AM  Result Value Ref Range Status   Specimen Description URINE, CATHETERIZED  Final   Special Requests NONE  Final   Colony Count   Final    >=100,000 COLONIES/ML Performed at [facility]    Culture   Final    ESCHERICHIA COLI GROUP B STREP(S.AGALACTIAE)ISOLATED Note: TESTING AGAINST S. AGALACTIAE NOT ROUTINELY PERFORMED DUE TO PREDICTABILITY OF AMP/PEN/VAN SUSCEPTIBILITY. Performed at [facility]    Report Status PENDING  Incomplete  MRSA PCR Screening     Status: None   Collection Time: 10/10/14  1:59 AM  Result Value Ref Range Status   MRSA by PCR NEGATIVE NEGATIVE Final    Comment:        The GeneXpert MRSA Assay (FDA approved for NASAL specimens only), is one component of a comprehensive MRSA colonization surveillance program. It is not intended to diagnose MRSA infection nor to guide or monitor treatment for MRSA infections.    Studies/Results: No results found.  Medications:  Prior to Admission:  Prescriptions prior to admission  Medication Sig Dispense Refill Last Dose  . amLODipine (NORVASC) 10 MG tablet Take 1 tablet (10 mg total) by mouth daily. 30 tablet  Past Week at Unknown time  . baclofen (LIORESAL) 20 MG tablet Take 1 tablet (20 mg total) by mouth 4 (four) times daily. 30 each 6 Past Week at Unknown time  . cloNIDine (CATAPRES) 0.2 MG tablet Take 1 tablet (0.2 mg total) by mouth 3 (three) times daily. 60 tablet 0 Past Week at Unknown time  . docusate sodium (COLACE) 100 MG capsule Take 100 mg by mouth at bedtime.   Past Week at Unknown time  . fesoterodine (TOVIAZ) 8 MG TB24 tablet Take 8 mg by mouth daily.  Past Week at Unknown time  . furosemide (LASIX) 40 MG tablet Take 40 mg by mouth daily.   Past Week at Unknown time   . gabapentin (NEURONTIN) 300 MG capsule Take 300 mg by mouth 3 (three) times daily.    Past Week at Unknown time  . levETIRAcetam (KEPPRA) 1000 MG tablet Take 1 tablet (1,000 mg total) by mouth 2 (two) times daily. (Patient taking differently: Take 1,000 mg by mouth 3 (three) times daily. ) 60 tablet 0 Past Week at Unknown time  . metoprolol tartrate (LOPRESSOR) 25 MG tablet Take 1 tablet (25 mg total) by mouth 2 (two) times daily. 60 tablet 0 Past Week at Unknown time  . Multiple Vitamins-Minerals (MULTIVITAMIN ADULT PO) Take by mouth daily.   Past Week at Unknown time  . pantoprazole (PROTONIX) 40 MG tablet Take 1 tablet (40 mg total) by mouth daily at 12 noon. 30 tablet 0 Past Week at Unknown time  . potassium chloride SA (K-DUR,KLOR-CON) 20 MEQ tablet Take 20 mEq by mouth daily.   Past Week at Unknown time  . ciprofloxacin (CIPRO) 500 MG tablet Take 1 tablet (500 mg total) by mouth 2 (two) times daily. (Patient not taking: Reported on 09/14/2014) 10 tablet 0 Not Taking   Scheduled: . antiseptic oral rinse  7 mL Mouth Rinse BID  . ceFEPime (MAXIPIME) IV  2 g Intravenous Q24H  . enoxaparin (LOVENOX) injection  40 mg Subcutaneous Q24H  . levETIRAcetam  1,500 mg Intravenous Q12H  . methylPREDNISolone (SOLU-MEDROL) injection  40 mg Intravenous Q12H  . vancomycin  1,000 mg Intravenous Q24H   Continuous: . 0.9 % NaCl with KCl 20 mEq / L 50 mL/hr at 10/12/14 0249   PPG:FQMKJIZXYOF (ZOFRAN) IV  Assesment: She was admitted with pneumonia and respiratory failure with sepsis. She is much improved today. Her encephalopathy is getting better. Her lungs are clearer. Principal Problem:   Sepsis Active Problems:   Paraplegia   Suprapubic catheter   Acute renal failure   Seizure disorder   UTI (lower urinary tract infection)   Encephalopathy acute   HCAP (healthcare-associated pneumonia)   Encephalomalacia   TBI (traumatic brain injury)   Severe protein-calorie malnutrition    Plan: Continue  antibiotics etc.    LOS: 2 days   Gelila Well L 10/12/2014, 7:48 AM

## 2014-10-12 NOTE — Progress Notes (Signed)
Subjective: Patient feels better. She is improving. No cough or shortness of breath. Her urine culture is growing E.Coli. Her renal function is gradually improving..  Objective: Vital signs in last 24 hours: Temp:  [97.8 F (36.6 C)-98.3 F (36.8 C)] 98.2 F (36.8 C) (05/18 0400) Pulse Rate:  [82-108] 88 (05/18 0800) Resp:  [12-24] 24 (05/18 0800) BP: (151-185)/(73-115) 151/91 mmHg (05/18 0800) SpO2:  [86 %-100 %] 86 % (05/18 0800) Weight:  [82.3 kg (181 lb 7 oz)] 82.3 kg (181 lb 7 oz) (05/18 0500) Weight change: 1.8 kg (3 lb 15.5 oz) Last BM Date: 10/11/14  Intake/Output from previous day: 05/17 0701 - 05/18 0700 In: 2663.3 [P.O.:240; I.V.:1673.3; IV Piggyback:750] Out: 2000 [Urine:2000]  PHYSICAL EXAM General appearance: alert and no distress Resp: diminished breath sounds bilaterally and rhonchi bilaterally Cardio: S1, S2 normal GI: soft, non-tender; bowel sounds normal; no masses,  no organomegaly Extremities: paraplegic  Lab Results:  Results for orders placed or performed during the hospital encounter of 10/09/14 (from the past 48 hour(s))  Basic metabolic panel     Status: Abnormal   Collection Time: 10/11/14  5:00 AM  Result Value Ref Range   Sodium 139 135 - 145 mmol/L   Potassium 3.4 (L) 3.5 - 5.1 mmol/L   Chloride 99 (L) 101 - 111 mmol/L   CO2 31 22 - 32 mmol/L   Glucose, Bld 106 (H) 65 - 99 mg/dL   BUN 17 6 - 20 mg/dL   Creatinine, Ser 0.75 0.44 - 1.00 mg/dL   Calcium 9.0 8.9 - 10.3 mg/dL   GFR calc non Af Amer >60 >60 mL/min   GFR calc Af Amer >60 >60 mL/min    Comment: (NOTE) The eGFR has been calculated using the CKD EPI equation. This calculation has not been validated in all clinical situations. eGFR's persistently <60 mL/min signify possible Chronic Kidney Disease.    Anion gap 9 5 - 15  Basic metabolic panel     Status: Abnormal   Collection Time: 10/11/14  6:15 AM  Result Value Ref Range   Sodium 141 135 - 145 mmol/L   Potassium 3.4 (L) 3.5  - 5.1 mmol/L   Chloride 116 (H) 101 - 111 mmol/L   CO2 18 (L) 22 - 32 mmol/L   Glucose, Bld 123 (H) 65 - 99 mg/dL   BUN 16 6 - 20 mg/dL   Creatinine, Ser 1.68 (H) 0.44 - 1.00 mg/dL   Calcium 7.3 (L) 8.9 - 10.3 mg/dL   GFR calc non Af Amer 36 (L) >60 mL/min   GFR calc Af Amer 41 (L) >60 mL/min    Comment: (NOTE) The eGFR has been calculated using the CKD EPI equation. This calculation has not been validated in all clinical situations. eGFR's persistently <60 mL/min signify possible Chronic Kidney Disease.    Anion gap 7 5 - 15  CBC     Status: Abnormal   Collection Time: 10/11/14  6:15 AM  Result Value Ref Range   WBC 9.8 4.0 - 10.5 K/uL   RBC 3.26 (L) 3.87 - 5.11 MIL/uL   Hemoglobin 9.5 (L) 12.0 - 15.0 g/dL   HCT 28.5 (L) 36.0 - 46.0 %   MCV 87.4 78.0 - 100.0 fL   MCH 29.1 26.0 - 34.0 pg   MCHC 33.3 30.0 - 36.0 g/dL   RDW 13.7 11.5 - 15.5 %   Platelets 212 150 - 400 K/uL  Procalcitonin     Status: None   Collection Time:  10/12/14  5:02 AM  Result Value Ref Range   Procalcitonin 0.42 ng/mL    Comment:        Interpretation: PCT (Procalcitonin) <= 0.5 ng/mL: Systemic infection (sepsis) is not likely. Local bacterial infection is possible. (NOTE)         ICU PCT Algorithm               Non ICU PCT Algorithm    ----------------------------     ------------------------------         PCT < 0.25 ng/mL                 PCT < 0.1 ng/mL     Stopping of antibiotics            Stopping of antibiotics       strongly encouraged.               strongly encouraged.    ----------------------------     ------------------------------       PCT level decrease by               PCT < 0.25 ng/mL       >= 80% from peak PCT       OR PCT 0.25 - 0.5 ng/mL          Stopping of antibiotics                                             encouraged.     Stopping of antibiotics           encouraged.    ----------------------------     ------------------------------       PCT level decrease by               PCT >= 0.25 ng/mL       < 80% from peak PCT        AND PCT >= 0.5 ng/mL            Continuin g antibiotics                                              encouraged.       Continuing antibiotics            encouraged.    ----------------------------     ------------------------------     PCT level increase compared          PCT > 0.5 ng/mL         with peak PCT AND          PCT >= 0.5 ng/mL             Escalation of antibiotics                                          strongly encouraged.      Escalation of antibiotics        strongly encouraged.   Basic metabolic panel     Status: Abnormal   Collection Time: 10/12/14  5:02 AM  Result Value Ref Range   Sodium 140 135 - 145 mmol/L   Potassium 3.6 3.5 -  5.1 mmol/L   Chloride 117 (H) 101 - 111 mmol/L   CO2 18 (L) 22 - 32 mmol/L   Glucose, Bld 117 (H) 65 - 99 mg/dL   BUN 14 6 - 20 mg/dL   Creatinine, Ser 1.51 (H) 0.44 - 1.00 mg/dL   Calcium 7.6 (L) 8.9 - 10.3 mg/dL   GFR calc non Af Amer 40 (L) >60 mL/min   GFR calc Af Amer 47 (L) >60 mL/min    Comment: (NOTE) The eGFR has been calculated using the CKD EPI equation. This calculation has not been validated in all clinical situations. eGFR's persistently <60 mL/min signify possible Chronic Kidney Disease.    Anion gap 5 5 - 15    ABGS No results for input(s): PHART, PO2ART, TCO2, HCO3 in the last 72 hours.  Invalid input(s): PCO2 CULTURES Recent Results (from the past 240 hour(s))  Blood culture (routine x 2)     Status: None (Preliminary result)   Collection Time: 10/09/14  9:28 PM  Result Value Ref Range Status   Specimen Description BLOOD RIGHT ARM  Final   Special Requests BOTTLES DRAWN AEROBIC AND ANAEROBIC 6CC  Final   Culture NO GROWTH 2 DAYS  Final   Report Status PENDING  Incomplete  Blood culture (routine x 2)     Status: None (Preliminary result)   Collection Time: 10/09/14  9:42 PM  Result Value Ref Range Status   Specimen Description BLOOD RIGHT HAND   Final   Special Requests BOTTLES DRAWN AEROBIC ONLY 6CC  Final   Culture NO GROWTH 2 DAYS  Final   Report Status PENDING  Incomplete  Urine culture     Status: None (Preliminary result)   Collection Time: 10/10/14 12:14 AM  Result Value Ref Range Status   Specimen Description URINE, CATHETERIZED  Final   Special Requests NONE  Final   Colony Count   Final    >=100,000 COLONIES/ML Performed at [facility]    Culture   Final    ESCHERICHIA COLI GROUP B STREP(S.AGALACTIAE)ISOLATED Note: TESTING AGAINST S. AGALACTIAE NOT ROUTINELY PERFORMED DUE TO PREDICTABILITY OF AMP/PEN/VAN SUSCEPTIBILITY. Performed at [facility]    Report Status PENDING  Incomplete  MRSA PCR Screening     Status: None   Collection Time: 10/10/14  1:59 AM  Result Value Ref Range Status   MRSA by PCR NEGATIVE NEGATIVE Final    Comment:        The GeneXpert MRSA Assay (FDA approved for NASAL specimens only), is one component of a comprehensive MRSA colonization surveillance program. It is not intended to diagnose MRSA infection nor to guide or monitor treatment for MRSA infections.    Studies/Results: No results found.  Medications: I have reviewed the patient's current medications.  Assesment:   Principal Problem:   Sepsis Active Problems:   Paraplegia   Suprapubic catheter   Acute renal failure   Seizure disorder   UTI (lower urinary tract infection)   Encephalopathy acute   HCAP (healthcare-associated pneumonia)   Encephalomalacia   TBI (traumatic brain injury)   Severe protein-calorie malnutrition    Plan:  Medications reviewed Continue IV antibiotics Continue IV fluid Will monitor CBC/BMP    LOS: 2 days   [name] 10/12/2014, 8:37 AM

## 2014-10-13 ENCOUNTER — Inpatient Hospital Stay (HOSPITAL_COMMUNITY): Payer: Medicaid Other

## 2014-10-13 LAB — CBC
HCT: 28.2 % — ABNORMAL LOW (ref 36.0–46.0)
Hemoglobin: 9.3 g/dL — ABNORMAL LOW (ref 12.0–15.0)
MCH: 28.7 pg (ref 26.0–34.0)
MCHC: 33 g/dL (ref 30.0–36.0)
MCV: 87 fL (ref 78.0–100.0)
PLATELETS: 219 10*3/uL (ref 150–400)
RBC: 3.24 MIL/uL — ABNORMAL LOW (ref 3.87–5.11)
RDW: 13.5 % (ref 11.5–15.5)
WBC: 8 10*3/uL (ref 4.0–10.5)

## 2014-10-13 MED ORDER — PREDNISONE 20 MG PO TABS
20.0000 mg | ORAL_TABLET | Freq: Every day | ORAL | Status: DC
  Administered 2014-10-13 – 2014-10-19 (×7): 20 mg via ORAL
  Filled 2014-10-13 (×7): qty 1

## 2014-10-13 NOTE — Care Management Note (Signed)
Case Management Note  Patient Details  Name: Ailis Fleege MRN: [id number] Date of Birth: [birth year]  Expected Discharge Date:                  Expected Discharge Plan:  [place]  In-House Referral:  NA  Discharge planning Services  CM Consult  Post Acute Care Choice:  Resumption of Svcs/PTA Provider Choice offered to:     DME Arranged:    DME Agency:     HH Arranged:    [place] Agency:     Status of Service:  In process, will continue to follow  Medicare Important Message Given:    Date Medicare IM Given:    Medicare IM give by:    Date Additional Medicare IM Given:    Additional Medicare Important Message give by:     If discussed at [place] of Stay Meetings, dates discussed:    Additional Comments: CSW has seen patent to discuss placement. Pt has decided she wants to return home with her son. Pt is active with [facility] RN and [facility] aware of admission. Pt will have order to resume [place] services at discharge. Sherald Barge, RN 10/13/2014, 2:29 PM

## 2014-10-13 NOTE — Progress Notes (Signed)
Subjective: She says she feels better. She has no new complaints. She's not coughing. No seizures overnight  Objective: Vital signs in last 24 hours: Temp:  [98.2 F (36.8 C)-99.2 F (37.3 C)] 98.3 F (36.8 C) (05/19 0431) Pulse Rate:  [76-115] 108 (05/19 0600) Resp:  [15-27] 27 (05/19 0600) BP: (144-171)/(73-91) 144/88 mmHg (05/19 0600) SpO2:  [86 %-97 %] 94 % (05/19 0600) Weight:  [81.9 kg (180 lb 8.9 oz)] 81.9 kg (180 lb 8.9 oz) (05/19 0551) Weight change: -0.4 kg (-14.1 oz) Last BM Date: 10/11/14  Intake/Output from previous day: 05/18 0701 - 05/19 0700 In: 790 [P.O.:240; I.V.:550] Out: 2051 [Urine:2050; Stool:1]  PHYSICAL EXAM General appearance: alert, cooperative and no distress Resp: clear to auscultation bilaterally Cardio: regular rate and rhythm, S1, S2 normal, no murmur, click, rub or gallop GI: soft, non-tender; bowel sounds normal; no masses,  no organomegaly Extremities: Paraplegic  Lab Results:  Results for orders placed or performed during the hospital encounter of 10/09/14 (from the past 48 hour(s))  Procalcitonin     Status: None   Collection Time: 10/12/14  5:02 AM  Result Value Ref Range   Procalcitonin 0.42 ng/mL    Comment:        Interpretation: PCT (Procalcitonin) <= 0.5 ng/mL: Systemic infection (sepsis) is not likely. Local bacterial infection is possible. (NOTE)         ICU PCT Algorithm               Non ICU PCT Algorithm    ----------------------------     ------------------------------         PCT < 0.25 ng/mL                 PCT < 0.1 ng/mL     Stopping of antibiotics            Stopping of antibiotics       strongly encouraged.               strongly encouraged.    ----------------------------     ------------------------------       PCT level decrease by               PCT < 0.25 ng/mL       >= 80% from peak PCT       OR PCT 0.25 - 0.5 ng/mL          Stopping of antibiotics                                             encouraged.      Stopping of antibiotics           encouraged.    ----------------------------     ------------------------------       PCT level decrease by              PCT >= 0.25 ng/mL       < 80% from peak PCT        AND PCT >= 0.5 ng/mL            Continuin g antibiotics                                              encouraged.  Continuing antibiotics            encouraged.    ----------------------------     ------------------------------     PCT level increase compared          PCT > 0.5 ng/mL         with peak PCT AND          PCT >= 0.5 ng/mL             Escalation of antibiotics                                          strongly encouraged.      Escalation of antibiotics        strongly encouraged.   Basic metabolic panel     Status: Abnormal   Collection Time: 10/12/14  5:02 AM  Result Value Ref Range   Sodium 140 135 - 145 mmol/L   Potassium 3.6 3.5 - 5.1 mmol/L   Chloride 117 (H) 101 - 111 mmol/L   CO2 18 (L) 22 - 32 mmol/L   Glucose, Bld 117 (H) 65 - 99 mg/dL   BUN 14 6 - 20 mg/dL   Creatinine, Ser 1.51 (H) 0.44 - 1.00 mg/dL   Calcium 7.6 (L) 8.9 - 10.3 mg/dL   GFR calc non Af Amer 40 (L) >60 mL/min   GFR calc Af Amer 47 (L) >60 mL/min    Comment: (NOTE) The eGFR has been calculated using the CKD EPI equation. This calculation has not been validated in all clinical situations. eGFR's persistently <60 mL/min signify possible Chronic Kidney Disease.    Anion gap 5 5 - 15  CBC     Status: Abnormal   Collection Time: 10/13/14  5:43 AM  Result Value Ref Range   WBC 8.0 4.0 - 10.5 K/uL   RBC 3.24 (L) 3.87 - 5.11 MIL/uL   Hemoglobin 9.3 (L) 12.0 - 15.0 g/dL   HCT 28.2 (L) 36.0 - 46.0 %   MCV 87.0 78.0 - 100.0 fL   MCH 28.7 26.0 - 34.0 pg   MCHC 33.0 30.0 - 36.0 g/dL   RDW 13.5 11.5 - 15.5 %   Platelets 219 150 - 400 K/uL    ABGS No results for input(s): PHART, PO2ART, TCO2, HCO3 in the last 72 hours.  Invalid input(s): PCO2 CULTURES Recent Results (from the past 240  hour(s))  Blood culture (routine x 2)     Status: None (Preliminary result)   Collection Time: 10/09/14  9:28 PM  Result Value Ref Range Status   Specimen Description BLOOD RIGHT ARM  Final   Special Requests BOTTLES DRAWN AEROBIC AND ANAEROBIC 6CC  Final   Culture NO GROWTH 3 DAYS  Final   Report Status PENDING  Incomplete  Blood culture (routine x 2)     Status: None (Preliminary result)   Collection Time: 10/09/14  9:42 PM  Result Value Ref Range Status   Specimen Description BLOOD RIGHT HAND  Final   Special Requests BOTTLES DRAWN AEROBIC ONLY [place]  Final   Culture NO GROWTH 3 DAYS  Final   Report Status PENDING  Incomplete  Urine culture     Status: None   Collection Time: 10/10/14 12:14 AM  Result Value Ref Range Status   Specimen Description URINE, CATHETERIZED  Final   Special Requests NONE  Final   Colony Count  Final    >=100,000 COLONIES/ML Performed at [facility] STREP(S.AGALACTIAE)ISOLATED Note: TESTING AGAINST S. AGALACTIAE NOT ROUTINELY PERFORMED DUE TO PREDICTABILITY OF AMP/PEN/VAN SUSCEPTIBILITY. Performed at [facility]    Report Status 10/12/2014 FINAL  Final   Organism ID, Bacteria ESCHERICHIA COLI  Final      Susceptibility   Escherichia coli - MIC*    AMPICILLIN 8 SENSITIVE Sensitive     CEFAZOLIN <=4 SENSITIVE Sensitive     CEFTRIAXONE <=1 SENSITIVE Sensitive     CIPROFLOXACIN >=4 RESISTANT Resistant     GENTAMICIN <=1 SENSITIVE Sensitive     LEVOFLOXACIN >=8 RESISTANT Resistant     NITROFURANTOIN <=16 SENSITIVE Sensitive     TOBRAMYCIN <=1 SENSITIVE Sensitive     TRIMETH/SULFA >=320 RESISTANT Resistant     PIP/TAZO <=4 SENSITIVE Sensitive     * ESCHERICHIA COLI  MRSA PCR Screening     Status: None   Collection Time: 10/10/14  1:59 AM  Result Value Ref Range Status   MRSA by PCR NEGATIVE NEGATIVE Final    Comment:        The GeneXpert MRSA Assay (FDA approved for NASAL  specimens only), is one component of a comprehensive MRSA colonization surveillance program. It is not intended to diagnose MRSA infection nor to guide or monitor treatment for MRSA infections.    Studies/Results: No results found.  Medications:  Prior to Admission:  Prescriptions prior to admission  Medication Sig Dispense Refill Last Dose  . amLODipine (NORVASC) 10 MG tablet Take 1 tablet (10 mg total) by mouth daily. 30 tablet  Past Week at Unknown time  . baclofen (LIORESAL) 20 MG tablet Take 1 tablet (20 mg total) by mouth 4 (four) times daily. 30 each 6 Past Week at Unknown time  . cloNIDine (CATAPRES) 0.2 MG tablet Take 1 tablet (0.2 mg total) by mouth 3 (three) times daily. 60 tablet 0 Past Week at Unknown time  . docusate sodium (COLACE) 100 MG capsule Take 100 mg by mouth at bedtime.   Past Week at Unknown time  . fesoterodine (TOVIAZ) 8 MG TB24 tablet Take 8 mg by mouth daily.   Past Week at Unknown time  . furosemide (LASIX) 40 MG tablet Take 40 mg by mouth daily.   Past Week at Unknown time  . gabapentin (NEURONTIN) 300 MG capsule Take 300 mg by mouth 3 (three) times daily.    Past Week at Unknown time  . levETIRAcetam (KEPPRA) 1000 MG tablet Take 1 tablet (1,000 mg total) by mouth 2 (two) times daily. (Patient taking differently: Take 1,000 mg by mouth 3 (three) times daily. ) 60 tablet 0 Past Week at Unknown time  . metoprolol tartrate (LOPRESSOR) 25 MG tablet Take 1 tablet (25 mg total) by mouth 2 (two) times daily. 60 tablet 0 Past Week at Unknown time  . Multiple Vitamins-Minerals (MULTIVITAMIN ADULT PO) Take by mouth daily.   Past Week at Unknown time  . pantoprazole (PROTONIX) 40 MG tablet Take 1 tablet (40 mg total) by mouth daily at 12 noon. 30 tablet 0 Past Week at Unknown time  . potassium chloride SA (K-DUR,KLOR-CON) 20 MEQ tablet Take 20 mEq by mouth daily.   Past Week at Unknown time  . ciprofloxacin (CIPRO) 500 MG tablet Take 1 tablet (500 mg total) by mouth 2  (two) times daily. (Patient not taking: Reported on 09/14/2014) 10 tablet 0 Not Taking  Scheduled: . antiseptic oral rinse  7 mL Mouth Rinse BID  . ceFEPime (MAXIPIME) IV  2 g Intravenous Q24H  . enoxaparin (LOVENOX) injection  40 mg Subcutaneous Q24H  . levETIRAcetam  1,500 mg Intravenous Q12H  . methylPREDNISolone (SOLU-MEDROL) injection  40 mg Intravenous Q12H  . vancomycin  1,000 mg Intravenous Q24H   Continuous: . 0.9 % NaCl with KCl 20 mEq / L 50 mL/hr at 10/13/14 0037   ORV:IFBPPHKFEXM (ZOFRAN) IV  Assesment: She was admitted with sepsis and pneumonia. Considering the fact that she's had seizures I think it's entirely possible that this was due to aspiration. She seems to be improving. Principal Problem:   Sepsis Active Problems:   Paraplegia   Suprapubic catheter   Acute renal failure   Seizure disorder   UTI (lower urinary tract infection)   Encephalopathy acute   HCAP (healthcare-associated pneumonia)   Encephalomalacia   TBI (traumatic brain injury)   Severe protein-calorie malnutrition    Plan: Continue current treatments. Discussed with Dr. Lenard Simmer. He plans to repeat chest x-ray and potentially move from ICU    LOS: 3 days   Arye Weyenberg L 10/13/2014, 7:30 AM

## 2014-10-13 NOTE — Progress Notes (Signed)
Report called to T.James, Therapist, sports. Pt transferred to room 326 via bed.

## 2014-10-13 NOTE — Progress Notes (Signed)
Subjective: Patient is resting. She is improving. Her breathing is  Improving. Her mental status is much better. She is growing resistant strain of E.coli in urine.  Objective: Vital signs in last 24 hours: Temp:  [98.2 F (36.8 C)-99.2 F (37.3 C)] 98.3 F (36.8 C) (05/19 0431) Pulse Rate:  [76-115] 108 (05/19 0600) Resp:  [15-27] 27 (05/19 0600) BP: (144-171)/(73-91) 144/88 mmHg (05/19 0600) SpO2:  [86 %-97 %] 94 % (05/19 0600) Weight:  [81.9 kg (180 lb 8.9 oz)] 81.9 kg (180 lb 8.9 oz) (05/19 0551) Weight change: -0.4 kg (-14.1 oz) Last BM Date: 10/11/14  Intake/Output from previous day: 05/18 0701 - 05/19 0700 In: 790 [P.O.:240; I.V.:550] Out: 2051 [Urine:2050; Stool:1]  PHYSICAL EXAM General appearance: alert and no distress Resp: diminished breath sounds bilaterally and rhonchi bilaterally Cardio: S1, S2 normal GI: soft, non-tender; bowel sounds normal; no masses,  no organomegaly Extremities: paraplegic  Lab Results:  Results for orders placed or performed during the hospital encounter of 10/09/14 (from the past 48 hour(s))  Procalcitonin     Status: None   Collection Time: 10/12/14  5:02 AM  Result Value Ref Range   Procalcitonin 0.42 ng/mL    Comment:        Interpretation: PCT (Procalcitonin) <= 0.5 ng/mL: Systemic infection (sepsis) is not likely. Local bacterial infection is possible. (NOTE)         ICU PCT Algorithm               Non ICU PCT Algorithm    ----------------------------     ------------------------------         PCT < 0.25 ng/mL                 PCT < 0.1 ng/mL     Stopping of antibiotics            Stopping of antibiotics       strongly encouraged.               strongly encouraged.    ----------------------------     ------------------------------       PCT level decrease by               PCT < 0.25 ng/mL       >= 80% from peak PCT       OR PCT 0.25 - 0.5 ng/mL          Stopping of antibiotics  encouraged.     Stopping of antibiotics           encouraged.    ----------------------------     ------------------------------       PCT level decrease by              PCT >= 0.25 ng/mL       < 80% from peak PCT        AND PCT >= 0.5 ng/mL            Continuin g antibiotics                                              encouraged.       Continuing antibiotics            encouraged.    ----------------------------     ------------------------------     PCT level increase compared  PCT > 0.5 ng/mL         with peak PCT AND          PCT >= 0.5 ng/mL             Escalation of antibiotics                                          strongly encouraged.      Escalation of antibiotics        strongly encouraged.   Basic metabolic panel     Status: Abnormal   Collection Time: 10/12/14  5:02 AM  Result Value Ref Range   Sodium 140 135 - 145 mmol/L   Potassium 3.6 3.5 - 5.1 mmol/L   Chloride 117 (H) 101 - 111 mmol/L   CO2 18 (L) 22 - 32 mmol/L   Glucose, Bld 117 (H) 65 - 99 mg/dL   BUN 14 6 - 20 mg/dL   Creatinine, Ser 1.51 (H) 0.44 - 1.00 mg/dL   Calcium 7.6 (L) 8.9 - 10.3 mg/dL   GFR calc non Af Amer 40 (L) >60 mL/min   GFR calc Af Amer 47 (L) >60 mL/min    Comment: (NOTE) The eGFR has been calculated using the CKD EPI equation. This calculation has not been validated in all clinical situations. eGFR's persistently <60 mL/min signify possible Chronic Kidney Disease.    Anion gap 5 5 - 15  CBC     Status: Abnormal   Collection Time: 10/13/14  5:43 AM  Result Value Ref Range   WBC 8.0 4.0 - 10.5 K/uL   RBC 3.24 (L) 3.87 - 5.11 MIL/uL   Hemoglobin 9.3 (L) 12.0 - 15.0 g/dL   HCT 28.2 (L) 36.0 - 46.0 %   MCV 87.0 78.0 - 100.0 fL   MCH 28.7 26.0 - 34.0 pg   MCHC 33.0 30.0 - 36.0 g/dL   RDW 13.5 11.5 - 15.5 %   Platelets 219 150 - 400 K/uL    ABGS No results for input(s): PHART, PO2ART, TCO2, HCO3 in the last 72 hours.  Invalid input(s): PCO2 CULTURES Recent Results  (from the past 240 hour(s))  Blood culture (routine x 2)     Status: None (Preliminary result)   Collection Time: 10/09/14  9:28 PM  Result Value Ref Range Status   Specimen Description BLOOD RIGHT ARM  Final   Special Requests BOTTLES DRAWN AEROBIC AND ANAEROBIC 6CC  Final   Culture NO GROWTH 3 DAYS  Final   Report Status PENDING  Incomplete  Blood culture (routine x 2)     Status: None (Preliminary result)   Collection Time: 10/09/14  9:42 PM  Result Value Ref Range Status   Specimen Description BLOOD RIGHT HAND  Final   Special Requests BOTTLES DRAWN AEROBIC ONLY 6CC  Final   Culture NO GROWTH 3 DAYS  Final   Report Status PENDING  Incomplete  Urine culture     Status: None   Collection Time: 10/10/14 12:14 AM  Result Value Ref Range Status   Specimen Description URINE, CATHETERIZED  Final   Special Requests NONE  Final   Colony Count   Final    >=100,000 COLONIES/ML Performed at [facility]    Culture   Final    ESCHERICHIA COLI GROUP B STREP(S.AGALACTIAE)ISOLATED Note: TESTING AGAINST S. AGALACTIAE NOT ROUTINELY PERFORMED DUE TO PREDICTABILITY OF AMP/PEN/VAN  SUSCEPTIBILITY. Performed at [facility]    Report Status 10/12/2014 FINAL  Final   Organism ID, Bacteria ESCHERICHIA COLI  Final      Susceptibility   Escherichia coli - MIC*    AMPICILLIN 8 SENSITIVE Sensitive     CEFAZOLIN <=4 SENSITIVE Sensitive     CEFTRIAXONE <=1 SENSITIVE Sensitive     CIPROFLOXACIN >=4 RESISTANT Resistant     GENTAMICIN <=1 SENSITIVE Sensitive     LEVOFLOXACIN >=8 RESISTANT Resistant     NITROFURANTOIN <=16 SENSITIVE Sensitive     TOBRAMYCIN <=1 SENSITIVE Sensitive     TRIMETH/SULFA >=320 RESISTANT Resistant     PIP/TAZO <=4 SENSITIVE Sensitive     * ESCHERICHIA COLI  MRSA PCR Screening     Status: None   Collection Time: 10/10/14  1:59 AM  Result Value Ref Range Status   MRSA by PCR NEGATIVE NEGATIVE Final    Comment:        The GeneXpert MRSA Assay (FDA approved  for NASAL specimens only), is one component of a comprehensive MRSA colonization surveillance program. It is not intended to diagnose MRSA infection nor to guide or monitor treatment for MRSA infections.    Studies/Results: No results found.  Medications: I have reviewed the patient's current medications.  Assesment:   Principal Problem:   Sepsis Active Problems:   Paraplegia   Suprapubic catheter   Acute renal failure   Seizure disorder   UTI (lower urinary tract infection)   Encephalopathy acute   HCAP (healthcare-associated pneumonia)   Encephalomalacia   TBI (traumatic brain injury)   Severe protein-calorie malnutrition    Plan:  Medications reviewed Continue IV antibiotics Will repeat chest x-ray Will monitor CBC/BMP    LOS: 3 days   [name] 10/13/2014, 7:39 AM

## 2014-10-13 NOTE — Clinical Social Work Note (Signed)
CSW met with patient as a follow up on previous discussion regarding SNF placement.  Patient advised that she had thought about it and made the decision to go home.  CSW signing off.     Ihor Gully, [place] [phone]

## 2014-10-13 NOTE — Progress Notes (Signed)
[facility] for Vancomycin & Cefepime Indication: HCAP, sepsis  Allergies  Allergen Reactions  . Quinine Derivatives Other (See Comments)    Alters mental status    Patient Measurements: Height: 4\' 10"  (147.3 cm) Weight: 180 lb 8.9 oz (81.9 kg) IBW/kg (Calculated) : 40.9   Vital Signs: Temp: 99.1 F (37.3 C) (05/19 0740) Temp Source: Oral (05/19 0740) BP: 146/89 mmHg (05/19 0800) Pulse Rate: 87 (05/19 0800) Intake/Output from previous day: 05/18 0701 - 05/19 0700 In: 790 [P.O.:240; I.V.:550] Out: 2051 [Urine:2050; Stool:1] Intake/Output from this shift: Total I/O In: 240 [P.O.:240] Out: -   Labs:  Recent Labs  10/11/14 0500 10/11/14 0615 10/12/14 0502 10/13/14 0543  WBC  --  9.8  --  8.0  HGB  --  9.5*  --  9.3*  PLT  --  212  --  219  CREATININE 0.75 1.68* 1.51*  --    Estimated Creatinine Clearance: 42.1 mL/min (by C-G formula based on Cr of 1.51). No results for input(s): VANCOTROUGH, VANCOPEAK, VANCORANDOM, GENTTROUGH, GENTPEAK, GENTRANDOM, TOBRATROUGH, TOBRAPEAK, TOBRARND, AMIKACINPEAK, AMIKACINTROU, AMIKACIN in the last 72 hours.   Microbiology: Recent Results (from the past 720 hour(s))  Blood culture (routine x 2)     Status: None (Preliminary result)   Collection Time: 10/09/14  9:28 PM  Result Value Ref Range Status   Specimen Description BLOOD RIGHT ARM  Final   Special Requests BOTTLES DRAWN AEROBIC AND ANAEROBIC 6CC  Final   Culture NO GROWTH 4 DAYS  Final   Report Status PENDING  Incomplete  Blood culture (routine x 2)     Status: None (Preliminary result)   Collection Time: 10/09/14  9:42 PM  Result Value Ref Range Status   Specimen Description BLOOD RIGHT HAND  Final   Special Requests BOTTLES DRAWN AEROBIC ONLY 6CC  Final   Culture NO GROWTH 4 DAYS  Final   Report Status PENDING  Incomplete  Urine culture     Status: None   Collection Time: 10/10/14 12:14 AM  Result Value Ref Range Status   Specimen  Description URINE, CATHETERIZED  Final   Special Requests NONE  Final   Colony Count   Final    >=100,000 COLONIES/ML Performed at [facility]    Culture   Final    ESCHERICHIA COLI GROUP B STREP(S.AGALACTIAE)ISOLATED Note: TESTING AGAINST S. AGALACTIAE NOT ROUTINELY PERFORMED DUE TO PREDICTABILITY OF AMP/PEN/VAN SUSCEPTIBILITY. Performed at [facility]    Report Status 10/12/2014 FINAL  Final   Organism ID, Bacteria ESCHERICHIA COLI  Final      Susceptibility   Escherichia coli - MIC*    AMPICILLIN 8 SENSITIVE Sensitive     CEFAZOLIN <=4 SENSITIVE Sensitive     CEFTRIAXONE <=1 SENSITIVE Sensitive     CIPROFLOXACIN >=4 RESISTANT Resistant     GENTAMICIN <=1 SENSITIVE Sensitive     LEVOFLOXACIN >=8 RESISTANT Resistant     NITROFURANTOIN <=16 SENSITIVE Sensitive     TOBRAMYCIN <=1 SENSITIVE Sensitive     TRIMETH/SULFA >=320 RESISTANT Resistant     PIP/TAZO <=4 SENSITIVE Sensitive     * ESCHERICHIA COLI  MRSA PCR Screening     Status: None   Collection Time: 10/10/14  1:59 AM  Result Value Ref Range Status   MRSA by PCR NEGATIVE NEGATIVE Final    Comment:        The GeneXpert MRSA Assay (FDA approved for NASAL specimens only), is one component of a comprehensive MRSA colonization  surveillance program. It is not intended to diagnose MRSA infection nor to guide or monitor treatment for MRSA infections.     Medical History: Past Medical History  Diagnosis Date  . Paraplegia (lower)   . Seizure disorder   . High blood pressure   . Suprapubic catheter   . Cancer     uterine  . Seizures   . Abnormal uterine bleeding (AUB) 06/15/2014    Medications:  Scheduled:  . antiseptic oral rinse  7 mL Mouth Rinse BID  . ceFEPime (MAXIPIME) IV  2 g Intravenous Q24H  . enoxaparin (LOVENOX) injection  40 mg Subcutaneous Q24H  . levETIRAcetam  1,500 mg Intravenous Q12H  . predniSONE  20 mg Oral Q breakfast  . vancomycin  1,000 mg Intravenous Q24H    Assessment: 49 yo F who continues on day#4 empiric broad-spectrum antibiotics for PNA (possible aspiration) and Ecoli UTI.   She is currently afebrile with normal WBC and clinically improving.   Renal function improving to baseline.   Goal of Therapy:  Eradication of infection Vancomycin trough level 15-20 mcg/ml  Plan:  Continue Vancomycin 1000mg  IV q24hrs Continue Cefepime to 2 grams IV q24hrs Check Vancomycin trough at steady state- defer level for now since anticipate d/c soon Monitor renal function and cx data  Duration of therapy per MD- consider d/c Vancomycin; change Cefepime to PO once appropriate  Netta Cedars, PharmD, BCPS 10/13/2014@10 :08 AM

## 2014-10-14 LAB — BASIC METABOLIC PANEL
Anion gap: 6 (ref 5–15)
BUN: 12 mg/dL (ref 6–20)
CALCIUM: 7.5 mg/dL — AB (ref 8.9–10.3)
CO2: 17 mmol/L — ABNORMAL LOW (ref 22–32)
Chloride: 117 mmol/L — ABNORMAL HIGH (ref 101–111)
Creatinine, Ser: 1.59 mg/dL — ABNORMAL HIGH (ref 0.44–1.00)
GFR calc Af Amer: 44 mL/min — ABNORMAL LOW (ref >60)
GFR, EST NON AFRICAN AMERICAN: 38 mL/min — AB (ref >60)
GLUCOSE: 91 mg/dL (ref 65–99)
Potassium: 2.9 mmol/L — ABNORMAL LOW (ref 3.5–5.1)
Sodium: 140 mmol/L (ref 135–145)

## 2014-10-14 LAB — CBC
HEMATOCRIT: 29 % — AB (ref 36.0–46.0)
HEMOGLOBIN: 9.6 g/dL — AB (ref 12.0–15.0)
MCH: 28.9 pg (ref 26.0–34.0)
MCHC: 33.1 g/dL (ref 30.0–36.0)
MCV: 87.3 fL (ref 78.0–100.0)
PLATELETS: 222 10*3/uL (ref 150–400)
RBC: 3.32 MIL/uL — ABNORMAL LOW (ref 3.87–5.11)
RDW: 13.6 % (ref 11.5–15.5)
WBC: 8.7 10*3/uL (ref 4.0–10.5)

## 2014-10-14 LAB — CULTURE, BLOOD (ROUTINE X 2)
Culture: NO GROWTH
Culture: NO GROWTH

## 2014-10-14 LAB — PROCALCITONIN: PROCALCITONIN: 0.15 ng/mL

## 2014-10-14 MED ORDER — LEVETIRACETAM IN NACL 1500 MG/100ML IV SOLN
1500.0000 mg | Freq: Two times a day (BID) | INTRAVENOUS | Status: DC
  Administered 2014-10-14 – 2014-10-19 (×11): 1500 mg via INTRAVENOUS
  Filled 2014-10-14 (×13): qty 100

## 2014-10-14 MED ORDER — POTASSIUM CHLORIDE 10 MEQ/100ML IV SOLN
10.0000 meq | INTRAVENOUS | Status: AC
  Administered 2014-10-14 (×5): 10 meq via INTRAVENOUS
  Filled 2014-10-14 (×2): qty 100

## 2014-10-14 MED ORDER — SODIUM CHLORIDE 0.9 % IV SOLN
1500.0000 mg | Freq: Two times a day (BID) | INTRAVENOUS | Status: DC
  Filled 2014-10-14: qty 15

## 2014-10-14 NOTE — Progress Notes (Signed)
Subjective: Patient is resting. He is improving.Her breathing is better. Her chest x-ray showing some improvement. Her K+ is 2.9.  Objective: Vital signs in last 24 hours: Temp:  [98.5 F (36.9 C)-99.1 F (37.3 C)] 98.6 F (37 C) (05/20 0627) Pulse Rate:  [83-95] 90 (05/20 0627) Resp:  [17-24] 20 (05/20 0627) BP: (146-164)/(82-94) 164/82 mmHg (05/20 0627) SpO2:  [93 %-96 %] 96 % (05/20 0627) Weight:  [87.544 kg (193 lb)] 87.544 kg (193 lb) (05/19 1651) Weight change: 5.644 kg (12 lb 7.1 oz) Last BM Date: 10/13/14  Intake/Output from previous day: 05/19 0701 - 05/20 0700 In: 2179.2 [P.O.:360; I.V.:1819.2] Out: 1200 [Urine:1200]  PHYSICAL EXAM General appearance: alert and no distress Resp: diminished breath sounds bilaterally and rhonchi bilaterally Cardio: S1, S2 normal GI: soft, non-tender; bowel sounds normal; no masses,  no organomegaly Extremities: paraplegic  Lab Results:  Results for orders placed or performed during the hospital encounter of 10/09/14 (from the past 48 hour(s))  CBC     Status: Abnormal   Collection Time: 10/13/14  5:43 AM  Result Value Ref Range   WBC 8.0 4.0 - 10.5 K/uL   RBC 3.24 (L) 3.87 - 5.11 MIL/uL   Hemoglobin 9.3 (L) 12.0 - 15.0 g/dL   HCT 28.2 (L) 36.0 - 46.0 %   MCV 87.0 78.0 - 100.0 fL   MCH 28.7 26.0 - 34.0 pg   MCHC 33.0 30.0 - 36.0 g/dL   RDW 13.5 11.5 - 15.5 %   Platelets 219 150 - 400 K/uL  CBC     Status: Abnormal   Collection Time: 10/14/14  6:26 AM  Result Value Ref Range   WBC 8.7 4.0 - 10.5 K/uL   RBC 3.32 (L) 3.87 - 5.11 MIL/uL   Hemoglobin 9.6 (L) 12.0 - 15.0 g/dL   HCT 29.0 (L) 36.0 - 46.0 %   MCV 87.3 78.0 - 100.0 fL   MCH 28.9 26.0 - 34.0 pg   MCHC 33.1 30.0 - 36.0 g/dL   RDW 13.6 11.5 - 15.5 %   Platelets 222 150 - 400 K/uL  Basic metabolic panel     Status: Abnormal   Collection Time: 10/14/14  6:26 AM  Result Value Ref Range   Sodium 140 135 - 145 mmol/L   Potassium 2.9 (L) 3.5 - 5.1 mmol/L   Chloride  117 (H) 101 - 111 mmol/L   CO2 17 (L) 22 - 32 mmol/L   Glucose, Bld 91 65 - 99 mg/dL   BUN 12 6 - 20 mg/dL   Creatinine, Ser 1.59 (H) 0.44 - 1.00 mg/dL   Calcium 7.5 (L) 8.9 - 10.3 mg/dL   GFR calc non Af Amer 38 (L) >60 mL/min   GFR calc Af Amer 44 (L) >60 mL/min    Comment: (NOTE) The eGFR has been calculated using the CKD EPI equation. This calculation has not been validated in all clinical situations. eGFR's persistently <60 mL/min signify possible Chronic Kidney Disease.    Anion gap 6 5 - 15    ABGS No results for input(s): PHART, PO2ART, TCO2, HCO3 in the last 72 hours.  Invalid input(s): PCO2 CULTURES Recent Results (from the past 240 hour(s))  Blood culture (routine x 2)     Status: None   Collection Time: 10/09/14  9:28 PM  Result Value Ref Range Status   Specimen Description BLOOD RIGHT ARM  Final   Special Requests BOTTLES DRAWN AEROBIC AND ANAEROBIC 6CC  Final   Culture NO GROWTH 5  DAYS  Final   Report Status 10/14/2014 FINAL  Final  Blood culture (routine x 2)     Status: None   Collection Time: 10/09/14  9:42 PM  Result Value Ref Range Status   Specimen Description BLOOD RIGHT HAND  Final   Special Requests BOTTLES DRAWN AEROBIC ONLY 6CC  Final   Culture NO GROWTH 5 DAYS  Final   Report Status 10/14/2014 FINAL  Final  Urine culture     Status: None   Collection Time: 10/10/14 12:14 AM  Result Value Ref Range Status   Specimen Description URINE, CATHETERIZED  Final   Special Requests NONE  Final   Colony Count   Final    >=100,000 COLONIES/ML Performed at [facility]    Culture   Final    ESCHERICHIA COLI GROUP B STREP(S.AGALACTIAE)ISOLATED Note: TESTING AGAINST S. AGALACTIAE NOT ROUTINELY PERFORMED DUE TO PREDICTABILITY OF AMP/PEN/VAN SUSCEPTIBILITY. Performed at [facility]    Report Status 10/12/2014 FINAL  Final   Organism ID, Bacteria ESCHERICHIA COLI  Final      Susceptibility   Escherichia coli - MIC*    AMPICILLIN 8  SENSITIVE Sensitive     CEFAZOLIN <=4 SENSITIVE Sensitive     CEFTRIAXONE <=1 SENSITIVE Sensitive     CIPROFLOXACIN >=4 RESISTANT Resistant     GENTAMICIN <=1 SENSITIVE Sensitive     LEVOFLOXACIN >=8 RESISTANT Resistant     NITROFURANTOIN <=16 SENSITIVE Sensitive     TOBRAMYCIN <=1 SENSITIVE Sensitive     TRIMETH/SULFA >=320 RESISTANT Resistant     PIP/TAZO <=4 SENSITIVE Sensitive     * ESCHERICHIA COLI  MRSA PCR Screening     Status: None   Collection Time: 10/10/14  1:59 AM  Result Value Ref Range Status   MRSA by PCR NEGATIVE NEGATIVE Final    Comment:        The GeneXpert MRSA Assay (FDA approved for NASAL specimens only), is one component of a comprehensive MRSA colonization surveillance program. It is not intended to diagnose MRSA infection nor to guide or monitor treatment for MRSA infections.    Studies/Results: Dg Chest 1 View  10/13/2014   CLINICAL DATA:  Pneumonia, weakness and shakiness, history of seizures, paraplegia, cancer.  EXAM: CHEST  1 VIEW  COMPARISON:  Portable chest x-ray of Oct 09, 2014  FINDINGS: The lungs are adequately inflated. The retrocardiac region remains dense and the left hemidiaphragm remains largely obscured. There is a small amount of pleural fluid layering laterally. The right lung is well-expanded. No significant atelectasis or infiltrate is demonstrated. The cardiac silhouette is top-normal in size. The pulmonary vascularity is not engorged. There are Harrington rods traversing the thoracic spine from approximately T4 -T11.  IMPRESSION: Mild interval improvement in the appearance of the chest since yesterday's study may largely be due to better inflation. There is persistent left basilar atelectasis or pneumonia with small pleural effusion.   Electronically Signed   By: David  Martinique M.D.   On: 10/13/2014 11:05    Medications: I have reviewed the patient's current medications.  Assesment:   Principal Problem:   Sepsis Active Problems:    Paraplegia   Suprapubic catheter   Acute renal failure   Seizure disorder   UTI (lower urinary tract infection)   Encephalopathy acute   HCAP (healthcare-associated pneumonia)   Encephalomalacia   TBI (traumatic brain injury)   Severe protein-calorie malnutrition Hypokalemia  Plan:  Medications reviewed Continue IV antibiotics Will supplement K+    LOS: 4  days   Jenesis Martin 10/14/2014, 7:32 AM

## 2014-10-14 NOTE — Care Management Note (Signed)
Case Management Note  Patient Details  Name: Lashana Crown MRN: [id number] Date of Birth: Jul 12, 1965  Expected Discharge Date:                  Expected Discharge Plan:  [place]  In-House Referral:  NA  Discharge planning Services  CM Consult  Post Acute Care Choice:  Resumption of Svcs/PTA Provider Choice offered to:     DME Arranged:    DME Agency:     HH Arranged:    [place] Agency:     Status of Service:  Completed, signed off  Medicare Important Message Given:  Yes Date Medicare IM Given:  10/14/14 Medicare IM give by:  Jolene Provost, RN, MSN, CM  Date Additional Medicare IM Given:    Additional Medicare Important Message give by:     If discussed at [place] of Stay Meetings, dates discussed:    Additional Comments: MD Anticipated DC over weekend. Pt will return home with resumption of HH services through [facility]. [facility] is aware of possible weekend DC. Pt's RN to notify [facility] if pt is discharged over weekend. Pt has no further CM needs.   Sherald Barge, RN 10/14/2014, 11:44 AM

## 2014-10-14 NOTE — Progress Notes (Signed)
Subjective: She is confused this morning and asked me to take her to her bedroom. Otherwise she seems to be doing better. She has no complaints.  Objective: Vital signs in last 24 hours: Temp:  [98.5 F (36.9 C)-99.1 F (37.3 C)] 98.6 F (37 C) (05/20 0627) Pulse Rate:  [83-95] 90 (05/20 0627) Resp:  [17-24] 20 (05/20 0627) BP: (151-164)/(82-94) 164/82 mmHg (05/20 0627) SpO2:  [94 %-96 %] 96 % (05/20 0627) Weight:  [87.544 kg (193 lb)] 87.544 kg (193 lb) (05/19 1651) Weight change: 5.644 kg (12 lb 7.1 oz) Last BM Date: 10/13/14  Intake/Output from previous day: 05/19 0701 - 05/20 0700 In: 2179.2 [P.O.:360; I.V.:1819.2] Out: 1200 [Urine:1200]  PHYSICAL EXAM General appearance: alert and Confused Resp: clear to auscultation bilaterally Cardio: regular rate and rhythm, S1, S2 normal, no murmur, click, rub or gallop GI: soft, non-tender; bowel sounds normal; no masses,  no organomegaly Extremities: Paraplegic  Lab Results:  Results for orders placed or performed during the hospital encounter of 10/09/14 (from the past 48 hour(s))  CBC     Status: Abnormal   Collection Time: 10/13/14  5:43 AM  Result Value Ref Range   WBC 8.0 4.0 - 10.5 K/uL   RBC 3.24 (L) 3.87 - 5.11 MIL/uL   Hemoglobin 9.3 (L) 12.0 - 15.0 g/dL   HCT 28.2 (L) 36.0 - 46.0 %   MCV 87.0 78.0 - 100.0 fL   MCH 28.7 26.0 - 34.0 pg   MCHC 33.0 30.0 - 36.0 g/dL   RDW 13.5 11.5 - 15.5 %   Platelets 219 150 - 400 K/uL  CBC     Status: Abnormal   Collection Time: 10/14/14  6:26 AM  Result Value Ref Range   WBC 8.7 4.0 - 10.5 K/uL   RBC 3.32 (L) 3.87 - 5.11 MIL/uL   Hemoglobin 9.6 (L) 12.0 - 15.0 g/dL   HCT 29.0 (L) 36.0 - 46.0 %   MCV 87.3 78.0 - 100.0 fL   MCH 28.9 26.0 - 34.0 pg   MCHC 33.1 30.0 - 36.0 g/dL   RDW 13.6 11.5 - 15.5 %   Platelets 222 150 - 400 K/uL  Procalcitonin     Status: None   Collection Time: 10/14/14  6:26 AM  Result Value Ref Range   Procalcitonin 0.15 ng/mL    Comment:         Interpretation: PCT (Procalcitonin) <= 0.5 ng/mL: Systemic infection (sepsis) is not likely. Local bacterial infection is possible. (NOTE)         ICU PCT Algorithm               Non ICU PCT Algorithm    ----------------------------     ------------------------------         PCT < 0.25 ng/mL                 PCT < 0.1 ng/mL     Stopping of antibiotics            Stopping of antibiotics       strongly encouraged.               strongly encouraged.    ----------------------------     ------------------------------       PCT level decrease by               PCT < 0.25 ng/mL       >= 80% from peak PCT       OR PCT 0.25 -  0.5 ng/mL          Stopping of antibiotics                                             encouraged.     Stopping of antibiotics           encouraged.    ----------------------------     ------------------------------       PCT level decrease by              PCT >= 0.25 ng/mL       < 80% from peak PCT        AND PCT >= 0.5 ng/mL            Continuin g antibiotics                                              encouraged.       Continuing antibiotics            encouraged.    ----------------------------     ------------------------------     PCT level increase compared          PCT > 0.5 ng/mL         with peak PCT AND          PCT >= 0.5 ng/mL             Escalation of antibiotics                                          strongly encouraged.      Escalation of antibiotics        strongly encouraged.   Basic metabolic panel     Status: Abnormal   Collection Time: 10/14/14  6:26 AM  Result Value Ref Range   Sodium 140 135 - 145 mmol/L   Potassium 2.9 (L) 3.5 - 5.1 mmol/L   Chloride 117 (H) 101 - 111 mmol/L   CO2 17 (L) 22 - 32 mmol/L   Glucose, Bld 91 65 - 99 mg/dL   BUN 12 6 - 20 mg/dL   Creatinine, Ser 1.59 (H) 0.44 - 1.00 mg/dL   Calcium 7.5 (L) 8.9 - 10.3 mg/dL   GFR calc non Af Amer 38 (L) >60 mL/min   GFR calc Af Amer 44 (L) >60 mL/min    Comment:  (NOTE) The eGFR has been calculated using the CKD EPI equation. This calculation has not been validated in all clinical situations. eGFR's persistently <60 mL/min signify possible Chronic Kidney Disease.    Anion gap 6 5 - 15    ABGS No results for input(s): PHART, PO2ART, TCO2, HCO3 in the last 72 hours.  Invalid input(s): PCO2 CULTURES Recent Results (from the past 240 hour(s))  Blood culture (routine x 2)     Status: None   Collection Time: 10/09/14  9:28 PM  Result Value Ref Range Status   Specimen Description BLOOD RIGHT ARM  Final   Special Requests BOTTLES DRAWN AEROBIC AND ANAEROBIC [place]  Final   Culture NO GROWTH 5 DAYS  Final   Report Status 10/14/2014 FINAL  Final  Blood culture (routine x 2)     Status: None   Collection Time: 10/09/14  9:42 PM  Result Value Ref Range Status   Specimen Description BLOOD RIGHT HAND  Final   Special Requests BOTTLES DRAWN AEROBIC ONLY 6CC  Final   Culture NO GROWTH 5 DAYS  Final   Report Status 10/14/2014 FINAL  Final  Urine culture     Status: None   Collection Time: 10/10/14 12:14 AM  Result Value Ref Range Status   Specimen Description URINE, CATHETERIZED  Final   Special Requests NONE  Final   Colony Count   Final    >=100,000 COLONIES/ML Performed at [facility]    Culture   Final    ESCHERICHIA COLI GROUP B STREP(S.AGALACTIAE)ISOLATED Note: TESTING AGAINST S. AGALACTIAE NOT ROUTINELY PERFORMED DUE TO PREDICTABILITY OF AMP/PEN/VAN SUSCEPTIBILITY. Performed at [facility]    Report Status 10/12/2014 FINAL  Final   Organism ID, Bacteria ESCHERICHIA COLI  Final      Susceptibility   Escherichia coli - MIC*    AMPICILLIN 8 SENSITIVE Sensitive     CEFAZOLIN <=4 SENSITIVE Sensitive     CEFTRIAXONE <=1 SENSITIVE Sensitive     CIPROFLOXACIN >=4 RESISTANT Resistant     GENTAMICIN <=1 SENSITIVE Sensitive     LEVOFLOXACIN >=8 RESISTANT Resistant     NITROFURANTOIN <=16 SENSITIVE Sensitive     TOBRAMYCIN <=1  SENSITIVE Sensitive     TRIMETH/SULFA >=320 RESISTANT Resistant     PIP/TAZO <=4 SENSITIVE Sensitive     * ESCHERICHIA COLI  MRSA PCR Screening     Status: None   Collection Time: 10/10/14  1:59 AM  Result Value Ref Range Status   MRSA by PCR NEGATIVE NEGATIVE Final    Comment:        The GeneXpert MRSA Assay (FDA approved for NASAL specimens only), is one component of a comprehensive MRSA colonization surveillance program. It is not intended to diagnose MRSA infection nor to guide or monitor treatment for MRSA infections.    Studies/Results: Dg Chest 1 View  10/13/2014   CLINICAL DATA:  Pneumonia, weakness and shakiness, history of seizures, paraplegia, cancer.  EXAM: CHEST  1 VIEW  COMPARISON:  Portable chest x-ray of Oct 09, 2014  FINDINGS: The lungs are adequately inflated. The retrocardiac region remains dense and the left hemidiaphragm remains largely obscured. There is a small amount of pleural fluid layering laterally. The right lung is well-expanded. No significant atelectasis or infiltrate is demonstrated. The cardiac silhouette is top-normal in size. The pulmonary vascularity is not engorged. There are Harrington rods traversing the thoracic spine from approximately T4 -T11.  IMPRESSION: Mild interval improvement in the appearance of the chest since yesterday's study may largely be due to better inflation. There is persistent left basilar atelectasis or pneumonia with small pleural effusion.   Electronically Signed   By: David  Martinique M.D.   On: 10/13/2014 11:05    Medications:  Prior to Admission:  Prescriptions prior to admission  Medication Sig Dispense Refill Last Dose  . amLODipine (NORVASC) 10 MG tablet Take 1 tablet (10 mg total) by mouth daily. 30 tablet  Past Week at Unknown time  . baclofen (LIORESAL) 20 MG tablet Take 1 tablet (20 mg total) by mouth 4 (four) times daily. 30 each 6 Past Week at Unknown time  . cloNIDine (CATAPRES) 0.2 MG tablet Take 1 tablet (0.2  mg total) by mouth 3 (three) times daily. 60 tablet 0 Past Week at Unknown  time  . docusate sodium (COLACE) 100 MG capsule Take 100 mg by mouth at bedtime.   Past Week at Unknown time  . fesoterodine (TOVIAZ) 8 MG TB24 tablet Take 8 mg by mouth daily.   Past Week at Unknown time  . furosemide (LASIX) 40 MG tablet Take 40 mg by mouth daily.   Past Week at Unknown time  . gabapentin (NEURONTIN) 300 MG capsule Take 300 mg by mouth 3 (three) times daily.    Past Week at Unknown time  . levETIRAcetam (KEPPRA) 1000 MG tablet Take 1 tablet (1,000 mg total) by mouth 2 (two) times daily. (Patient taking differently: Take 1,000 mg by mouth 3 (three) times daily. ) 60 tablet 0 Past Week at Unknown time  . metoprolol tartrate (LOPRESSOR) 25 MG tablet Take 1 tablet (25 mg total) by mouth 2 (two) times daily. 60 tablet 0 Past Week at Unknown time  . Multiple Vitamins-Minerals (MULTIVITAMIN ADULT PO) Take by mouth daily.   Past Week at Unknown time  . pantoprazole (PROTONIX) 40 MG tablet Take 1 tablet (40 mg total) by mouth daily at 12 noon. 30 tablet 0 Past Week at Unknown time  . potassium chloride SA (K-DUR,KLOR-CON) 20 MEQ tablet Take 20 mEq by mouth daily.   Past Week at Unknown time  . ciprofloxacin (CIPRO) 500 MG tablet Take 1 tablet (500 mg total) by mouth 2 (two) times daily. (Patient not taking: Reported on 09/14/2014) 10 tablet 0 Not Taking   Scheduled: . antiseptic oral rinse  7 mL Mouth Rinse BID  . ceFEPime (MAXIPIME) IV  2 g Intravenous Q24H  . enoxaparin (LOVENOX) injection  40 mg Subcutaneous Q24H  . levETIRacetam  1,500 mg Intravenous Q12H  . potassium chloride  10 mEq Intravenous Q1 Hr x 5  . predniSONE  20 mg Oral Q breakfast  . vancomycin  1,000 mg Intravenous Q24H   Continuous: . 0.9 % NaCl with KCl 20 mEq / L 50 mL/hr at 10/14/14 9276   FRE:[id number] [facility]) IV  Assesment: She was admitted with sepsis probably combined urinary tract infection and I think likely aspiration  pneumonia. She has improved as far as her breathing is concerned and she is growing Escherichia coli in her urine.  She is confused probably metabolic encephalopathy and she has had a traumatic brain injury as well.  She has hypokalemia which is being replaced Principal Problem:   Sepsis Active Problems:   Paraplegia   Suprapubic catheter   Acute renal failure   Seizure disorder   UTI (lower urinary tract infection)   Encephalopathy acute   HCAP (healthcare-associated pneumonia)   Encephalomalacia   TBI (traumatic brain injury)   Severe protein-calorie malnutrition    Plan: I think when she is ready for discharge she could be sent home on Augmentin which should cover aspiration pneumonia and Escherichia coli. I will plan to follow more peripherally now.    LOS: 4 days   Leroy Trim L 10/14/2014, 8:23 AM

## 2014-10-15 NOTE — Progress Notes (Signed)
She says she's doing better. I will plan to sign off at this point. She will need follow-up chest x-ray to document that she completely clears her pneumonia. Thanks for allowing me to see her with you

## 2014-10-15 NOTE — Progress Notes (Signed)
Subjective: Patient is confused and disoriented. Her breathing is not labored. No fever or chills. Her chest x-ray showed improvement of her pneumonia. She is receiving combinations of IV antibiotics. Objective: Vital signs in last 24 hours: Temp:  [97.5 F (36.4 C)-98.7 F (37.1 C)] 98.7 F (37.1 C) (05/21 0618) Pulse Rate:  [92-115] 101 (05/21 0618) Resp:  [18-20] 20 (05/21 0618) BP: (168-176)/(75-106) 168/88 mmHg (05/21 0618) SpO2:  [93 %-97 %] 97 % (05/21 0618) Weight change:  Last BM Date: 10/13/14  Intake/Output from previous day: 05/20 0701 - 05/21 0700 In: 2149.2 [I.V.:1199.2; IV Piggyback:950] Out: 2650 [Urine:2650]  PHYSICAL EXAM General appearance: alert and no distress Resp: diminished breath sounds bilaterally and rhonchi bilaterally Cardio: S1, S2 normal GI: soft, non-tender; bowel sounds normal; no masses,  no organomegaly Extremities: paraplegic  Lab Results:  Results for orders placed or performed during the hospital encounter of 10/09/14 (from the past 48 hour(s))  CBC     Status: Abnormal   Collection Time: 10/14/14  6:26 AM  Result Value Ref Range   WBC 8.7 4.0 - 10.5 K/uL   RBC 3.32 (L) 3.87 - 5.11 MIL/uL   Hemoglobin 9.6 (L) 12.0 - 15.0 g/dL   HCT 29.0 (L) 36.0 - 46.0 %   MCV 87.3 78.0 - 100.0 fL   MCH 28.9 26.0 - 34.0 pg   MCHC 33.1 30.0 - 36.0 g/dL   RDW 13.6 11.5 - 15.5 %   Platelets 222 150 - 400 K/uL  Procalcitonin     Status: None   Collection Time: 10/14/14  6:26 AM  Result Value Ref Range   Procalcitonin 0.15 ng/mL    Comment:        Interpretation: PCT (Procalcitonin) <= 0.5 ng/mL: Systemic infection (sepsis) is not likely. Local bacterial infection is possible. (NOTE)         ICU PCT Algorithm               Non ICU PCT Algorithm    ----------------------------     ------------------------------         PCT < 0.25 ng/mL                 PCT < 0.1 ng/mL     Stopping of antibiotics            Stopping of antibiotics       strongly  encouraged.               strongly encouraged.    ----------------------------     ------------------------------       PCT level decrease by               PCT < 0.25 ng/mL       >= 80% from peak PCT       OR PCT 0.25 - 0.5 ng/mL          Stopping of antibiotics                                             encouraged.     Stopping of antibiotics           encouraged.    ----------------------------     ------------------------------       PCT level decrease by              PCT >= 0.25 ng/mL       <  80% from peak PCT        AND PCT >= 0.5 ng/mL            Continuin g antibiotics                                              encouraged.       Continuing antibiotics            encouraged.    ----------------------------     ------------------------------     PCT level increase compared          PCT > 0.5 ng/mL         with peak PCT AND          PCT >= 0.5 ng/mL             Escalation of antibiotics                                          strongly encouraged.      Escalation of antibiotics        strongly encouraged.   Basic metabolic panel     Status: Abnormal   Collection Time: 10/14/14  6:26 AM  Result Value Ref Range   Sodium 140 135 - 145 mmol/L   Potassium 2.9 (L) 3.5 - 5.1 mmol/L   Chloride 117 (H) 101 - 111 mmol/L   CO2 17 (L) 22 - 32 mmol/L   Glucose, Bld 91 65 - 99 mg/dL   BUN 12 6 - 20 mg/dL   Creatinine, Ser 1.59 (H) 0.44 - 1.00 mg/dL   Calcium 7.5 (L) 8.9 - 10.3 mg/dL   GFR calc non Af Amer 38 (L) >60 mL/min   GFR calc Af Amer 44 (L) >60 mL/min    Comment: (NOTE) The eGFR has been calculated using the CKD EPI equation. This calculation has not been validated in all clinical situations. eGFR's persistently <60 mL/min signify possible Chronic Kidney Disease.    Anion gap 6 5 - 15    ABGS No results for input(s): PHART, PO2ART, TCO2, HCO3 in the last 72 hours.  Invalid input(s): PCO2 CULTURES Recent Results (from the past 240 hour(s))  Blood culture (routine x  2)     Status: None   Collection Time: 10/09/14  9:28 PM  Result Value Ref Range Status   Specimen Description BLOOD RIGHT ARM  Final   Special Requests BOTTLES DRAWN AEROBIC AND ANAEROBIC 6CC  Final   Culture NO GROWTH 5 DAYS  Final   Report Status 10/14/2014 FINAL  Final  Blood culture (routine x 2)     Status: None   Collection Time: 10/09/14  9:42 PM  Result Value Ref Range Status   Specimen Description BLOOD RIGHT HAND  Final   Special Requests BOTTLES DRAWN AEROBIC ONLY [place]  Final   Culture NO GROWTH 5 DAYS  Final   Report Status 10/14/2014 FINAL  Final  Urine culture     Status: None   Collection Time: 10/10/14 12:14 AM  Result Value Ref Range Status   Specimen Description URINE, CATHETERIZED  Final   Special Requests NONE  Final   Colony Count   Final    >=100,000 COLONIES/ML Performed at [facility]  Final    ESCHERICHIA COLI GROUP B STREP(S.AGALACTIAE)ISOLATED Note: TESTING AGAINST S. AGALACTIAE NOT ROUTINELY PERFORMED DUE TO PREDICTABILITY OF AMP/PEN/VAN SUSCEPTIBILITY. Performed at [facility]    Report Status 10/12/2014 FINAL  Final   Organism ID, Bacteria ESCHERICHIA COLI  Final      Susceptibility   Escherichia coli - MIC*    AMPICILLIN 8 SENSITIVE Sensitive     CEFAZOLIN <=4 SENSITIVE Sensitive     CEFTRIAXONE <=1 SENSITIVE Sensitive     CIPROFLOXACIN >=4 RESISTANT Resistant     GENTAMICIN <=1 SENSITIVE Sensitive     LEVOFLOXACIN >=8 RESISTANT Resistant     NITROFURANTOIN <=16 SENSITIVE Sensitive     TOBRAMYCIN <=1 SENSITIVE Sensitive     TRIMETH/SULFA >=320 RESISTANT Resistant     PIP/TAZO <=4 SENSITIVE Sensitive     * ESCHERICHIA COLI  MRSA PCR Screening     Status: None   Collection Time: 10/10/14  1:59 AM  Result Value Ref Range Status   MRSA by PCR NEGATIVE NEGATIVE Final    Comment:        The GeneXpert MRSA Assay (FDA approved for NASAL specimens only), is one component of a comprehensive MRSA  colonization surveillance program. It is not intended to diagnose MRSA infection nor to guide or monitor treatment for MRSA infections.    Studies/Results: Dg Chest 1 View  10/13/2014   CLINICAL DATA:  Pneumonia, weakness and shakiness, history of seizures, paraplegia, cancer.  EXAM: CHEST  1 VIEW  COMPARISON:  Portable chest x-ray of Oct 09, 2014  FINDINGS: The lungs are adequately inflated. The retrocardiac region remains dense and the left hemidiaphragm remains largely obscured. There is a small amount of pleural fluid layering laterally. The right lung is well-expanded. No significant atelectasis or infiltrate is demonstrated. The cardiac silhouette is top-normal in size. The pulmonary vascularity is not engorged. There are Harrington rods traversing the thoracic spine from approximately T4 -T11.  IMPRESSION: Mild interval improvement in the appearance of the chest since yesterday's study may largely be due to better inflation. There is persistent left basilar atelectasis or pneumonia with small pleural effusion.   Electronically Signed   By: David  Martinique M.D.   On: 10/13/2014 11:05    Medications: I have reviewed the patient's current medications.  Assesment:   Principal Problem:   Sepsis Active Problems:   Paraplegia   Suprapubic catheter   Acute renal failure   Seizure disorder   UTI (lower urinary tract infection)   Encephalopathy acute   HCAP (healthcare-associated pneumonia)   Encephalomalacia   TBI (traumatic brain injury)   Severe protein-calorie malnutrition Hypokalemia Delirium  Plan:  Medications reviewed Continue IV antibiotics Cbc/Bmp in AM Neurology follow up   LOS: 5 days   Yifan Auker 10/15/2014, 7:21 AM

## 2014-10-16 LAB — BASIC METABOLIC PANEL
ANION GAP: 9 (ref 5–15)
BUN: 12 mg/dL (ref 6–20)
CALCIUM: 7.3 mg/dL — AB (ref 8.9–10.3)
CHLORIDE: 116 mmol/L — AB (ref 101–111)
CO2: 14 mmol/L — AB (ref 22–32)
CREATININE: 1.48 mg/dL — AB (ref 0.44–1.00)
GFR, EST AFRICAN AMERICAN: 48 mL/min — AB (ref >60)
GFR, EST NON AFRICAN AMERICAN: 41 mL/min — AB (ref >60)
GLUCOSE: 82 mg/dL (ref 65–99)
POTASSIUM: 2.8 mmol/L — AB (ref 3.5–5.1)
Sodium: 139 mmol/L (ref 135–145)

## 2014-10-16 NOTE — Progress Notes (Signed)
Subjective: Patient confused and disoriented. No fever or chills. Neurology revaluation request and it is pending.. Objective: Vital signs in last 24 hours: Temp:  [98 F (36.7 C)-98.5 F (36.9 C)] 98 F (36.7 C) (05/22 0655) Pulse Rate:  [91-93] 91 (05/22 0655) Resp:  [20] 20 (05/22 0655) BP: (169-170)/(91-92) 170/92 mmHg (05/22 0655) SpO2:  [97 %] 97 % (05/22 0655) Weight change:  Last BM Date: 10/15/14  Intake/Output from previous day: 05/21 0701 - 05/22 0700 In: 1640 [P.O.:200; I.V.:1190; IV Piggyback:250] Out: 1750 [Urine:1750]  PHYSICAL EXAM General appearance: alert and no distress Resp: diminished breath sounds bilaterally and rhonchi bilaterally Cardio: S1, S2 normal GI: soft, non-tender; bowel sounds normal; no masses,  no organomegaly Extremities: paraplegic  Lab Results:  No results found for this or any previous visit (from the past 48 hour(s)).  ABGS No results for input(s): PHART, PO2ART, TCO2, HCO3 in the last 72 hours.  Invalid input(s): PCO2 CULTURES Recent Results (from the past 240 hour(s))  Blood culture (routine x 2)     Status: None   Collection Time: 10/09/14  9:28 PM  Result Value Ref Range Status   Specimen Description BLOOD RIGHT ARM  Final   Special Requests BOTTLES DRAWN AEROBIC AND ANAEROBIC 6CC  Final   Culture NO GROWTH 5 DAYS  Final   Report Status 10/14/2014 FINAL  Final  Blood culture (routine x 2)     Status: None   Collection Time: 10/09/14  9:42 PM  Result Value Ref Range Status   Specimen Description BLOOD RIGHT HAND  Final   Special Requests BOTTLES DRAWN AEROBIC ONLY [place]  Final   Culture NO GROWTH 5 DAYS  Final   Report Status 10/14/2014 FINAL  Final  Urine culture     Status: None   Collection Time: 10/10/14 12:14 AM  Result Value Ref Range Status   Specimen Description URINE, CATHETERIZED  Final   Special Requests NONE  Final   Colony Count   Final    >=100,000 COLONIES/ML Performed at [facility]    Culture   Final    ESCHERICHIA COLI GROUP B STREP(S.AGALACTIAE)ISOLATED Note: TESTING AGAINST S. AGALACTIAE NOT ROUTINELY PERFORMED DUE TO PREDICTABILITY OF AMP/PEN/VAN SUSCEPTIBILITY. Performed at [facility]    Report Status 10/12/2014 FINAL  Final   Organism ID, Bacteria ESCHERICHIA COLI  Final      Susceptibility   Escherichia coli - MIC*    AMPICILLIN 8 SENSITIVE Sensitive     CEFAZOLIN <=4 SENSITIVE Sensitive     CEFTRIAXONE <=1 SENSITIVE Sensitive     CIPROFLOXACIN >=4 RESISTANT Resistant     GENTAMICIN <=1 SENSITIVE Sensitive     LEVOFLOXACIN >=8 RESISTANT Resistant     NITROFURANTOIN <=16 SENSITIVE Sensitive     TOBRAMYCIN <=1 SENSITIVE Sensitive     TRIMETH/SULFA >=320 RESISTANT Resistant     PIP/TAZO <=4 SENSITIVE Sensitive     * ESCHERICHIA COLI  MRSA PCR Screening     Status: None   Collection Time: 10/10/14  1:59 AM  Result Value Ref Range Status   MRSA by PCR NEGATIVE NEGATIVE Final    Comment:        The GeneXpert MRSA Assay (FDA approved for NASAL specimens only), is one component of a comprehensive MRSA colonization surveillance program. It is not intended to diagnose MRSA infection nor to guide or monitor treatment for MRSA infections.    Studies/Results: No results found.  Medications: I have reviewed the patient's current medications.  Assesment:  Principal Problem:   Sepsis Active Problems:   Paraplegia   Suprapubic catheter   Acute renal failure   Seizure disorder   UTI (lower urinary tract infection)   Encephalopathy acute   HCAP (healthcare-associated pneumonia)   Encephalomalacia   TBI (traumatic brain injury)   Severe protein-calorie malnutrition Hypokalemia Delirium  Plan:  Medications reviewed Continue IV antibiotics Cbc/Bmp in AM Neurology follow up   LOS: 6 days   Jaydian Santana 10/16/2014, 8:07 AM

## 2014-10-17 ENCOUNTER — Inpatient Hospital Stay (HOSPITAL_COMMUNITY): Payer: Medicaid Other

## 2014-10-17 ENCOUNTER — Inpatient Hospital Stay (HOSPITAL_COMMUNITY)
Admit: 2014-10-17 | Discharge: 2014-10-17 | Disposition: A | Discharge status: Home / Self Care | Payer: Medicaid Other | Attending: Neurology | Admitting: Neurology

## 2014-10-17 LAB — BASIC METABOLIC PANEL
ANION GAP: 7 (ref 5–15)
Anion gap: 7 (ref 5–15)
BUN: 11 mg/dL (ref 6–20)
BUN: 10 mg/dL (ref 6–20)
CHLORIDE: 120 mmol/L — AB (ref 101–111)
CHLORIDE: 117 mmol/L — AB (ref 101–111)
CO2: 13 mmol/L — ABNORMAL LOW (ref 22–32)
CO2: 14 mmol/L — AB (ref 22–32)
CREATININE: 1.44 mg/dL — AB (ref 0.44–1.00)
Calcium: 7.1 mg/dL — ABNORMAL LOW (ref 8.9–10.3)
Calcium: 7.4 mg/dL — ABNORMAL LOW (ref 8.9–10.3)
Creatinine, Ser: 1.37 mg/dL — ABNORMAL HIGH (ref 0.44–1.00)
GFR calc Af Amer: 53 mL/min — ABNORMAL LOW (ref >60)
GFR calc non Af Amer: 43 mL/min — ABNORMAL LOW (ref >60)
GFR, EST AFRICAN AMERICAN: 50 mL/min — AB (ref >60)
GFR, EST NON AFRICAN AMERICAN: 45 mL/min — AB (ref >60)
Glucose, Bld: 74 mg/dL (ref 65–99)
Glucose, Bld: 76 mg/dL (ref 65–99)
Potassium: 2.5 mmol/L — CL (ref 3.5–5.1)
Potassium: 3.4 mmol/L — ABNORMAL LOW (ref 3.5–5.1)
Sodium: 138 mmol/L (ref 135–145)
Sodium: 140 mmol/L (ref 135–145)

## 2014-10-17 LAB — CBC
HEMATOCRIT: 27 % — AB (ref 36.0–46.0)
Hemoglobin: 9.2 g/dL — ABNORMAL LOW (ref 12.0–15.0)
MCH: 29.4 pg (ref 26.0–34.0)
MCHC: 34.1 g/dL (ref 30.0–36.0)
MCV: 86.3 fL (ref 78.0–100.0)
Platelets: 226 10*3/uL (ref 150–400)
RBC: 3.13 MIL/uL — ABNORMAL LOW (ref 3.87–5.11)
RDW: 13.9 % (ref 11.5–15.5)
WBC: 11.7 10*3/uL — ABNORMAL HIGH (ref 4.0–10.5)

## 2014-10-17 LAB — MAGNESIUM: MAGNESIUM: 1.6 mg/dL — AB (ref 1.7–2.4)

## 2014-10-17 MED ORDER — SODIUM CHLORIDE 0.45 % IV SOLN
INTRAVENOUS | Status: DC
  Administered 2014-10-17: 10:00:00 via INTRAVENOUS
  Filled 2014-10-17 (×2): qty 1000

## 2014-10-17 MED ORDER — SODIUM CHLORIDE 0.9 % IV SOLN
200.0000 mg | Freq: Once | INTRAVENOUS | Status: AC
  Administered 2014-10-17: 200 mg via INTRAVENOUS
  Filled 2014-10-17: qty 20

## 2014-10-17 MED ORDER — LACOSAMIDE 200 MG/20ML IV SOLN
INTRAVENOUS | Status: AC
  Filled 2014-10-17: qty 20

## 2014-10-17 MED ORDER — POTASSIUM CHLORIDE 10 MEQ/100ML IV SOLN
10.0000 meq | INTRAVENOUS | Status: AC
  Administered 2014-10-17 (×6): 10 meq via INTRAVENOUS
  Filled 2014-10-17 (×2): qty 100

## 2014-10-17 MED ORDER — POTASSIUM CHLORIDE 10 MEQ/100ML IV SOLN
10.0000 meq | INTRAVENOUS | Status: AC
  Administered 2014-10-17 (×4): 10 meq via INTRAVENOUS
  Filled 2014-10-17: qty 100

## 2014-10-17 MED ORDER — LACOSAMIDE 200 MG/20ML IV SOLN
100.0000 mg | Freq: Two times a day (BID) | INTRAVENOUS | Status: DC
  Administered 2014-10-17 – 2014-10-19 (×4): 100 mg via INTRAVENOUS
  Filled 2014-10-17 (×6): qty 10

## 2014-10-17 NOTE — Progress Notes (Signed)
CRITICAL VALUE ALERT  Critical value received:  Potassium of 2.5  Date of notification:  10/17/2014  Time of notification:  01:08  Critical value read back: yes  Nurse who received alert:  Stephannie Peters  MD notified (1st page):  Dr. Legrand Rams  Time of first page:  01:13  MD notified (2nd page): 01:22  Time of second page:  Responding MD:  Dr. Legrand Rams  Time MD responded:  01:22
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
## 2014-10-17 NOTE — Progress Notes (Signed)
Subjective: Patient is still confused and disoriented. Her K+ is low. She is on supplement. No fever or chills.. Objective: Vital signs in last 24 hours: Temp:  [98 F (36.7 C)-99.6 F (37.6 C)] 99.6 F (37.6 C) (05/23 0609) Pulse Rate:  [96-110] 96 (05/23 0609) Resp:  [18-20] 20 (05/23 0609) BP: (158-178)/(79-93) 166/82 mmHg (05/23 0609) SpO2:  [95 %-98 %] 98 % (05/23 0609) Weight change:  Last BM Date: 10/15/14  Intake/Output from previous day: 05/22 0701 - 05/23 0700 In: 1672.5 [P.O.:400; I.V.:972.5; IV Piggyback:300] Out: 1400 [Urine:1400]  PHYSICAL EXAM General appearance: alert and no distress Resp: diminished breath sounds bilaterally and rhonchi bilaterally Cardio: S1, S2 normal GI: soft, non-tender; bowel sounds normal; no masses,  no organomegaly Extremities: paraplegic  Lab Results:  Results for orders placed or performed during the hospital encounter of 10/09/14 (from the past 48 hour(s))  Basic metabolic panel     Status: Abnormal   Collection Time: 10/16/14  9:10 AM  Result Value Ref Range   Sodium 139 135 - 145 mmol/L   Potassium 2.8 (L) 3.5 - 5.1 mmol/L   Chloride 116 (H) 101 - 111 mmol/L   CO2 14 (L) 22 - 32 mmol/L   Glucose, Bld 82 65 - 99 mg/dL   BUN 12 6 - 20 mg/dL   Creatinine, Ser 1.48 (H) 0.44 - 1.00 mg/dL   Calcium 7.3 (L) 8.9 - 10.3 mg/dL   GFR calc non Af Amer 41 (L) >60 mL/min   GFR calc Af Amer 48 (L) >60 mL/min    Comment: (NOTE) The eGFR has been calculated using the CKD EPI equation. This calculation has not been validated in all clinical situations. eGFR's persistently <60 mL/min signify possible Chronic Kidney Disease.    Anion gap 9 5 - 15  Basic metabolic panel     Status: Abnormal   Collection Time: 10/17/14 12:09 AM  Result Value Ref Range   Sodium 138 135 - 145 mmol/L   Potassium 2.5 (LL) 3.5 - 5.1 mmol/L    Comment: RESULT REPEATED AND VERIFIED CRITICAL RESULT CALLED TO, READ BACK BY AND VERIFIED WITH:  CUMMINGS,V @ 0110  ON 10/17/14 BY WOODIE,J    Chloride 117 (H) 101 - 111 mmol/L   CO2 14 (L) 22 - 32 mmol/L   Glucose, Bld 74 65 - 99 mg/dL   BUN 11 6 - 20 mg/dL   Creatinine, Ser 1.44 (H) 0.44 - 1.00 mg/dL   Calcium 7.1 (L) 8.9 - 10.3 mg/dL   GFR calc non Af Amer 43 (L) >60 mL/min   GFR calc Af Amer 50 (L) >60 mL/min    Comment: (NOTE) The eGFR has been calculated using the CKD EPI equation. This calculation has not been validated in all clinical situations. eGFR's persistently <60 mL/min signify possible Chronic Kidney Disease.    Anion gap 7 5 - 15  CBC     Status: Abnormal   Collection Time: 10/17/14 12:09 AM  Result Value Ref Range   WBC 11.7 (H) 4.0 - 10.5 K/uL   RBC 3.13 (L) 3.87 - 5.11 MIL/uL   Hemoglobin 9.2 (L) 12.0 - 15.0 g/dL   HCT 27.0 (L) 36.0 - 46.0 %   MCV 86.3 78.0 - 100.0 fL   MCH 29.4 26.0 - 34.0 pg   MCHC 34.1 30.0 - 36.0 g/dL   RDW 13.9 11.5 - 15.5 %   Platelets 226 150 - 400 K/uL    ABGS No results for input(s): PHART, PO2ART, TCO2,  HCO3 in the last 72 hours.  Invalid input(s): PCO2 CULTURES Recent Results (from the past 240 hour(s))  Blood culture (routine x 2)     Status: None   Collection Time: 10/09/14  9:28 PM  Result Value Ref Range Status   Specimen Description BLOOD RIGHT ARM  Final   Special Requests BOTTLES DRAWN AEROBIC AND ANAEROBIC 6CC  Final   Culture NO GROWTH 5 DAYS  Final   Report Status 10/14/2014 FINAL  Final  Blood culture (routine x 2)     Status: None   Collection Time: 10/09/14  9:42 PM  Result Value Ref Range Status   Specimen Description BLOOD RIGHT HAND  Final   Special Requests BOTTLES DRAWN AEROBIC ONLY [place]  Final   Culture NO GROWTH 5 DAYS  Final   Report Status 10/14/2014 FINAL  Final  Urine culture     Status: None   Collection Time: 10/10/14 12:14 AM  Result Value Ref Range Status   Specimen Description URINE, CATHETERIZED  Final   Special Requests NONE  Final   Colony Count   Final    >=100,000 COLONIES/ML Performed at  [facility]    Culture   Final    ESCHERICHIA COLI GROUP B STREP(S.AGALACTIAE)ISOLATED Note: TESTING AGAINST S. AGALACTIAE NOT ROUTINELY PERFORMED DUE TO PREDICTABILITY OF AMP/PEN/VAN SUSCEPTIBILITY. Performed at [facility]    Report Status 10/12/2014 FINAL  Final   Organism ID, Bacteria ESCHERICHIA COLI  Final      Susceptibility   Escherichia coli - MIC*    AMPICILLIN 8 SENSITIVE Sensitive     CEFAZOLIN <=4 SENSITIVE Sensitive     CEFTRIAXONE <=1 SENSITIVE Sensitive     CIPROFLOXACIN >=4 RESISTANT Resistant     GENTAMICIN <=1 SENSITIVE Sensitive     LEVOFLOXACIN >=8 RESISTANT Resistant     NITROFURANTOIN <=16 SENSITIVE Sensitive     TOBRAMYCIN <=1 SENSITIVE Sensitive     TRIMETH/SULFA >=320 RESISTANT Resistant     PIP/TAZO <=4 SENSITIVE Sensitive     * ESCHERICHIA COLI  MRSA PCR Screening     Status: None   Collection Time: 10/10/14  1:59 AM  Result Value Ref Range Status   MRSA by PCR NEGATIVE NEGATIVE Final    Comment:        The GeneXpert MRSA Assay (FDA approved for NASAL specimens only), is one component of a comprehensive MRSA colonization surveillance program. It is not intended to diagnose MRSA infection nor to guide or monitor treatment for MRSA infections.    Studies/Results: No results found.  Medications: I have reviewed the patient's current medications.  Assesment:   Principal Problem:   Sepsis Active Problems:   Paraplegia   Suprapubic catheter   Acute renal failure   Seizure disorder   UTI (lower urinary tract infection)   Encephalopathy acute   HCAP (healthcare-associated pneumonia)   Encephalomalacia   TBI (traumatic brain injury)   Severe protein-calorie malnutrition Hypokalemia Delirium  Plan:  Medications reviewed Continue IV antibiotics Will continue to supplement K+ Neurology follow up   LOS: 7 days   Aela Bohan 10/17/2014, 7:28 AM

## 2014-10-17 NOTE — Procedures (Signed)
  [place] A. Merlene Laughter, MD     [email or web address]           HISTORY: The patient has a known history of epilepsy. She presents with episodes of eye deviation to the left and the confusion suspicious for seizures.  MEDICATIONS: Scheduled Meds: . antiseptic oral rinse  7 mL Mouth Rinse BID  . ceFEPime (MAXIPIME) IV  2 g Intravenous Q24H  . enoxaparin (LOVENOX) injection  40 mg Subcutaneous Q24H  . lacosamide (VIMPAT) IV  100 mg Intravenous Q12H  . levETIRacetam  1,500 mg Intravenous Q12H  . predniSONE  20 mg Oral Q breakfast   Continuous Infusions:  PRN Meds:.ondansetron (ZOFRAN) IV  Prior to Admission medications   Medication Sig Start Date End Date Taking? Authorizing Provider  amLODipine (NORVASC) 10 MG tablet Take 1 tablet (10 mg total) by mouth daily. 05/10/14  Yes Donne Hazel, MD  baclofen (LIORESAL) 20 MG tablet Take 1 tablet (20 mg total) by mouth 4 (four) times daily. 06/14/14  Yes Melvenia Beam, MD  cloNIDine (CATAPRES) 0.2 MG tablet Take 1 tablet (0.2 mg total) by mouth 3 (three) times daily. 05/10/14  Yes Donne Hazel, MD  docusate sodium (COLACE) 100 MG capsule Take 100 mg by mouth at bedtime.   Yes Historical Provider, MD  fesoterodine (TOVIAZ) 8 MG TB24 tablet Take 8 mg by mouth daily.   Yes Historical Provider, MD  furosemide (LASIX) 40 MG tablet Take 40 mg by mouth daily.   Yes Historical Provider, MD  gabapentin (NEURONTIN) 300 MG capsule Take 300 mg by mouth 3 (three) times daily.    Yes Historical Provider, MD  levETIRAcetam (KEPPRA) 1000 MG tablet Take 1 tablet (1,000 mg total) by mouth 2 (two) times daily. Patient taking differently: Take 1,000 mg by mouth 3 (three) times daily.  05/10/14  Yes Donne Hazel, MD  metoprolol tartrate (LOPRESSOR) 25 MG tablet Take 1 tablet (25 mg total) by mouth 2 (two) times daily. 05/10/14  Yes Donne Hazel, MD  Multiple Vitamins-Minerals (MULTIVITAMIN ADULT PO) Take by mouth daily.   Yes Historical  Provider, MD  pantoprazole (PROTONIX) 40 MG tablet Take 1 tablet (40 mg total) by mouth daily at 12 noon. 05/10/14  Yes Donne Hazel, MD  potassium chloride SA (K-DUR,KLOR-CON) 20 MEQ tablet Take 20 mEq by mouth daily.   Yes Historical Provider, MD  ciprofloxacin (CIPRO) 500 MG tablet Take 1 tablet (500 mg total) by mouth 2 (two) times daily. Patient not taking: Reported on 09/14/2014 08/29/14   [name], MD      ANALYSIS: A 16 channel recording using standard 10 20 measurements is conducted for 20 minutes. The background activity gets as high as 8 Hz bilaterally. Awake and drowsy activities are observed. Photic simulation and hyperventilation are not carried out. The patient has occasional slowing involving the right parietal region. The patient has multiple episodes of eye deviation to the left associated with expected changes in the bifrontal regions but not associated with clear electrographic correlates. She is noted to have a right product slowing sometimes before after these events. No clear electrographic seizures observed.   IMPRESSION: Right parietal slowing which can be associated with focal cerebral disturbance or epileptic focus.       Jnaya Butrick A. Merlene Laughter, M.D.  Diplomate, Tax adviser of Psychiatry and Neurology ( Neurology).

## 2014-10-17 NOTE — Progress Notes (Signed)
Phoned Dr. Legrand Rams about pt's potassium level. He ordered 6 runs of potassium and for lab to check pt's Magnesium level during 05:00 lab draw. Will continue to monitor pt.

## 2014-10-17 NOTE — Progress Notes (Signed)
EEG Completed; Results Pending  

## 2014-10-18 LAB — BASIC METABOLIC PANEL
Anion gap: 7 (ref 5–15)
BUN: 11 mg/dL (ref 6–20)
CALCIUM: 7.4 mg/dL — AB (ref 8.9–10.3)
CHLORIDE: 120 mmol/L — AB (ref 101–111)
CO2: 13 mmol/L — ABNORMAL LOW (ref 22–32)
Creatinine, Ser: 1.31 mg/dL — ABNORMAL HIGH (ref 0.44–1.00)
GFR calc Af Amer: 56 mL/min — ABNORMAL LOW (ref >60)
GFR calc non Af Amer: 48 mL/min — ABNORMAL LOW (ref >60)
Glucose, Bld: 75 mg/dL (ref 65–99)
POTASSIUM: 3.1 mmol/L — AB (ref 3.5–5.1)
SODIUM: 140 mmol/L (ref 135–145)

## 2014-10-18 MED ORDER — LACOSAMIDE 200 MG/20ML IV SOLN
INTRAVENOUS | Status: AC
  Filled 2014-10-18: qty 20

## 2014-10-18 MED ORDER — POTASSIUM CHLORIDE 20 MEQ/15ML (10%) PO SOLN
40.0000 meq | ORAL | Status: AC
  Administered 2014-10-18 (×2): 40 meq via ORAL
  Filled 2014-10-18 (×2): qty 30

## 2014-10-18 MED ORDER — MAGNESIUM SULFATE 2 GM/50ML IV SOLN
2.0000 g | Freq: Once | INTRAVENOUS | Status: AC
  Administered 2014-10-18: 2 g via INTRAVENOUS
  Filled 2014-10-18: qty 50

## 2014-10-18 MED ORDER — POTASSIUM CHLORIDE 10 MEQ/100ML IV SOLN
10.0000 meq | INTRAVENOUS | Status: DC
  Filled 2014-10-18: qty 100

## 2014-10-18 NOTE — Care Management Note (Signed)
Case Management Note  Patient Details  Name: [name] MRN: [id number] Date of Birth: 1965-11-14  Subjective/Objective:                    Action/Plan:   Expected Discharge Date:                  Expected Discharge Plan:  [place]  In-House Referral:  Clinical Social Work  Discharge planning Services  CM Consult  Post Acute Care Choice:  Resumption of Svcs/PTA Provider Choice offered to:  Adult Children  DME Arranged:    DME Agency:     HH Arranged:    HH Agency:     Status of Service:  In process, will continue to follow  Medicare Important Message Given:  Yes Date Medicare IM Given:  10/14/14 Medicare IM give by:  Jolene Provost, RN, MSN, CM  Date Additional Medicare IM Given:    Additional Medicare Important Message give by:     If discussed at [place] of Stay Meetings, dates discussed:  10/18/14  Additional Comments:  Joylene Draft, RN 10/18/2014, 3:59 PM

## 2014-10-18 NOTE — Clinical Social Work Note (Signed)
CSW discussed nursing home placement with pt's son Valarie Merino at bedside. He is agreeable to [place] and is aware of Medicaid placement. SNF list provided. CSW will initiate bed search and follow up. Valarie Merino reports that he knows once pt improves she will demand to return home, but feels placement may be better option at this point.   Benay Pike, [place]

## 2014-10-18 NOTE — Progress Notes (Signed)
UR chart review completed.  

## 2014-10-18 NOTE — Care Management Note (Signed)
Case Management Note  Patient Details  Name: [name] MRN: [id number] Date of Birth: 05/13/1966  Subjective/Objective:                    Action/Plan:   Expected Discharge Date:                  Expected Discharge Plan:  [place]  In-House Referral:  Clinical Social Work  Discharge planning Services  CM Consult  Post Acute Care Choice:  Resumption of Svcs/PTA Provider Choice offered to:  Adult Children  DME Arranged:    DME Agency:     HH Arranged:    HH Agency:     Status of Service:  In process, will continue to follow  Medicare Important Message Given:  Yes Date Medicare IM Given:  10/14/14 Medicare IM give by:  Jolene Provost, RN, MSN, CM  Date Additional Medicare IM Given:    Additional Medicare Important Message give by:     If discussed at [place] of Stay Meetings, dates discussed:    Additional Comments: Pt still very confused at this time. Neurology does not feel pt is ready for discharge. CM spoke with pts son [name] who is the main caretaker of the pt. He would like to persue NH placement at discharge. CSW is aware and will start bed search. Pts son did stated that if pts mental status improves prior to discharge, he will take her home with increase in support from [facility]. Will continue to follow for discharge planning needs. [name] [place], RN 10/18/2014, 10:57 AM

## 2014-10-18 NOTE — Clinical Social Work Placement (Signed)
   CLINICAL SOCIAL WORK PLACEMENT  NOTE  Date:  10/18/2014  Patient Details  Name: [name] MRN: [id number] Date of Birth: April 07, 1966  Clinical Social Work is seeking post-discharge placement for this patient at the [place] level of care (*CSW will initial, date and re-position this form in  chart as items are completed):  Yes   Patient/family provided with [place] Work Department's list of facilities offering this level of care within the geographic area requested by the patient (or if unable, by the patient's family).  Yes   Patient/family informed of their freedom to choose among providers that offer the needed level of care, that participate in Medicare, Medicaid or managed care program needed by the patient, have an available bed and are willing to accept the patient.  Yes   Patient/family informed of [facility]'s ownership interest in [facility] and [facility], as well as of the fact that they are under no obligation to receive care at these facilities.  PASRR submitted to EDS on       PASRR number received on       Existing PASRR number confirmed on 10/18/14     FL2 transmitted to all facilities in geographic area requested by pt/family on 10/18/14     FL2 transmitted to all facilities within larger geographic area on       Patient informed that his/her managed care company has contracts with or will negotiate with certain facilities, including the following:            Patient/family informed of bed offers received.  Patient chooses bed at       Physician recommends and patient chooses bed at      Patient to be transferred to   on  .  Patient to be transferred to facility by       Patient family notified on   of transfer.  Name of family member notified:        PHYSICIAN       Additional Comment:    _______________________________________________ Salome Arnt, LCSW 10/18/2014, 12:50  PM [phone]

## 2014-10-18 NOTE — Progress Notes (Signed)
Subjective: Patient remained confused and disoriented. She was seen by neurology and her seizure medication being adjusted.  Her K+ is about 3.1. Objective: Vital signs in last 24 hours: Temp:  [98.1 F (36.7 C)-99 F (37.2 C)] 98.6 F (37 C) (05/24 9373) Pulse Rate:  [91-100] 99 (05/24 0614) Resp:  [20] 20 (05/24 0614) BP: (165-176)/(80-101) 171/98 mmHg (05/24 0614) SpO2:  [97 %-100 %] 100 % (05/24 4287) Weight change:  Last BM Date: 10/15/14  Intake/Output from previous day: 05/23 0701 - 05/24 0700 In: 455 [P.O.:120; IV Piggyback:335] Out: 1900 [Urine:1900]  PHYSICAL EXAM General appearance: alert and no distress Resp: diminished breath sounds bilaterally and rhonchi bilaterally Cardio: S1, S2 normal GI: soft, non-tender; bowel sounds normal; no masses,  no organomegaly Extremities: paraplegic  Lab Results:  Results for orders placed or performed during the hospital encounter of 10/09/14 (from the past 48 hour(s))  Basic metabolic panel     Status: Abnormal   Collection Time: 10/16/14  9:10 AM  Result Value Ref Range   Sodium 139 135 - 145 mmol/L   Potassium 2.8 (L) 3.5 - 5.1 mmol/L   Chloride 116 (H) 101 - 111 mmol/L   CO2 14 (L) 22 - 32 mmol/L   Glucose, Bld 82 65 - 99 mg/dL   BUN 12 6 - 20 mg/dL   Creatinine, Ser 1.48 (H) 0.44 - 1.00 mg/dL   Calcium 7.3 (L) 8.9 - 10.3 mg/dL   GFR calc non Af Amer 41 (L) >60 mL/min   GFR calc Af Amer 48 (L) >60 mL/min    Comment: (NOTE) The eGFR has been calculated using the CKD EPI equation. This calculation has not been validated in all clinical situations. eGFR's persistently <60 mL/min signify possible Chronic Kidney Disease.    Anion gap 9 5 - 15  Basic metabolic panel     Status: Abnormal   Collection Time: 10/17/14 12:09 AM  Result Value Ref Range   Sodium 138 135 - 145 mmol/L   Potassium 2.5 (LL) 3.5 - 5.1 mmol/L    Comment: RESULT REPEATED AND VERIFIED CRITICAL RESULT CALLED TO, READ BACK BY AND VERIFIED WITH:  CUMMINGS,V @ 0110 ON 10/17/14 BY WOODIE,J    Chloride 117 (H) 101 - 111 mmol/L   CO2 14 (L) 22 - 32 mmol/L   Glucose, Bld 74 65 - 99 mg/dL   BUN 11 6 - 20 mg/dL   Creatinine, Ser 1.44 (H) 0.44 - 1.00 mg/dL   Calcium 7.1 (L) 8.9 - 10.3 mg/dL   GFR calc non Af Amer 43 (L) >60 mL/min   GFR calc Af Amer 50 (L) >60 mL/min    Comment: (NOTE) The eGFR has been calculated using the CKD EPI equation. This calculation has not been validated in all clinical situations. eGFR's persistently <60 mL/min signify possible Chronic Kidney Disease.    Anion gap 7 5 - 15  CBC     Status: Abnormal   Collection Time: 10/17/14 12:09 AM  Result Value Ref Range   WBC 11.7 (H) 4.0 - 10.5 K/uL   RBC 3.13 (L) 3.87 - 5.11 MIL/uL   Hemoglobin 9.2 (L) 12.0 - 15.0 g/dL   HCT 27.0 (L) 36.0 - 46.0 %   MCV 86.3 78.0 - 100.0 fL   MCH 29.4 26.0 - 34.0 pg   MCHC 34.1 30.0 - 36.0 g/dL   RDW 13.9 11.5 - 15.5 %   Platelets 226 150 - 400 K/uL  Magnesium     Status: Abnormal  Collection Time: 10/17/14  6:17 AM  Result Value Ref Range   Magnesium 1.6 (L) 1.7 - 2.4 mg/dL  Basic metabolic panel     Status: Abnormal   Collection Time: 10/17/14  6:45 AM  Result Value Ref Range   Sodium 140 135 - 145 mmol/L   Potassium 3.4 (L) 3.5 - 5.1 mmol/L    Comment: DELTA CHECK NOTED   Chloride 120 (H) 101 - 111 mmol/L   CO2 13 (L) 22 - 32 mmol/L   Glucose, Bld 76 65 - 99 mg/dL   BUN 10 6 - 20 mg/dL   Creatinine, Ser 1.37 (H) 0.44 - 1.00 mg/dL   Calcium 7.4 (L) 8.9 - 10.3 mg/dL   GFR calc non Af Amer 45 (L) >60 mL/min   GFR calc Af Amer 53 (L) >60 mL/min    Comment: (NOTE) The eGFR has been calculated using the CKD EPI equation. This calculation has not been validated in all clinical situations. eGFR's persistently <60 mL/min signify possible Chronic Kidney Disease.    Anion gap 7 5 - 15  Basic metabolic panel     Status: Abnormal   Collection Time: 10/18/14  6:02 AM  Result Value Ref Range   Sodium 140 135 - 145 mmol/L    Potassium 3.1 (L) 3.5 - 5.1 mmol/L   Chloride 120 (H) 101 - 111 mmol/L   CO2 13 (L) 22 - 32 mmol/L   Glucose, Bld 75 65 - 99 mg/dL   BUN 11 6 - 20 mg/dL   Creatinine, Ser 1.31 (H) 0.44 - 1.00 mg/dL   Calcium 7.4 (L) 8.9 - 10.3 mg/dL   GFR calc non Af Amer 48 (L) >60 mL/min   GFR calc Af Amer 56 (L) >60 mL/min    Comment: (NOTE) The eGFR has been calculated using the CKD EPI equation. This calculation has not been validated in all clinical situations. eGFR's persistently <60 mL/min signify possible Chronic Kidney Disease.    Anion gap 7 5 - 15    ABGS No results for input(s): PHART, PO2ART, TCO2, HCO3 in the last 72 hours.  Invalid input(s): PCO2 CULTURES Recent Results (from the past 240 hour(s))  Blood culture (routine x 2)     Status: None   Collection Time: 10/09/14  9:28 PM  Result Value Ref Range Status   Specimen Description BLOOD RIGHT ARM  Final   Special Requests BOTTLES DRAWN AEROBIC AND ANAEROBIC 6CC  Final   Culture NO GROWTH 5 DAYS  Final   Report Status 10/14/2014 FINAL  Final  Blood culture (routine x 2)     Status: None   Collection Time: 10/09/14  9:42 PM  Result Value Ref Range Status   Specimen Description BLOOD RIGHT HAND  Final   Special Requests BOTTLES DRAWN AEROBIC ONLY [place]  Final   Culture NO GROWTH 5 DAYS  Final   Report Status 10/14/2014 FINAL  Final  Urine culture     Status: None   Collection Time: 10/10/14 12:14 AM  Result Value Ref Range Status   Specimen Description URINE, CATHETERIZED  Final   Special Requests NONE  Final   Colony Count   Final    >=100,000 COLONIES/ML Performed at [facility]    Culture   Final    ESCHERICHIA COLI GROUP B STREP(S.AGALACTIAE)ISOLATED Note: TESTING AGAINST S. AGALACTIAE NOT ROUTINELY PERFORMED DUE TO PREDICTABILITY OF AMP/PEN/VAN SUSCEPTIBILITY. Performed at [facility]    Report Status 10/12/2014 FINAL  Final   Organism  ID, Bacteria ESCHERICHIA COLI  Final      Susceptibility    Escherichia coli - MIC*    AMPICILLIN 8 SENSITIVE Sensitive     CEFAZOLIN <=4 SENSITIVE Sensitive     CEFTRIAXONE <=1 SENSITIVE Sensitive     CIPROFLOXACIN >=4 RESISTANT Resistant     GENTAMICIN <=1 SENSITIVE Sensitive     LEVOFLOXACIN >=8 RESISTANT Resistant     NITROFURANTOIN <=16 SENSITIVE Sensitive     TOBRAMYCIN <=1 SENSITIVE Sensitive     TRIMETH/SULFA >=320 RESISTANT Resistant     PIP/TAZO <=4 SENSITIVE Sensitive     * ESCHERICHIA COLI  MRSA PCR Screening     Status: None   Collection Time: 10/10/14  1:59 AM  Result Value Ref Range Status   MRSA by PCR NEGATIVE NEGATIVE Final    Comment:        The GeneXpert MRSA Assay (FDA approved for NASAL specimens only), is one component of a comprehensive MRSA colonization surveillance program. It is not intended to diagnose MRSA infection nor to guide or monitor treatment for MRSA infections.    Studies/Results: Ct Head Wo Contrast  10/17/2014   CLINICAL DATA:  Altered mental status, possible seizures and encephalopathy.  EXAM: CT HEAD WITHOUT CONTRAST  TECHNIQUE: Contiguous axial images were obtained from the base of the skull through the vertex without intravenous contrast.  COMPARISON:  10/10/2014 CT.  MR brain 05/03/2014.  FINDINGS: The patient was unable to remain motionless for the exam. Small or subtle lesions could be overlooked.  No evidence for acute infarction, hemorrhage, mass lesion, or extra-axial fluid. Moderate ventriculomegaly out of proportion to the degree of cortical atrophy, NPH not excluded. Slight prominence superior vermian cistern. 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  Calvarium intact.  No visible sinus or mastoid air fluid level.  IMPRESSION: Motion degraded exam.  Moderate ventriculomegaly out of proportion to the degree of cortical volume loss. NPH not excluded.  Remote LEFT frontal infarct.  No acute intracranial findings.   Electronically Signed   By: Rolla Flatten M.D.   On: 10/17/2014  12:51    Medications: I have reviewed the patient's current medications.  Assesment:   Principal Problem:   Sepsis Active Problems:   Paraplegia   Suprapubic catheter   Acute renal failure   Seizure disorder   UTI (lower urinary tract infection)   Encephalopathy acute   HCAP (healthcare-associated pneumonia)   Encephalomalacia   TBI (traumatic brain injury)   Severe protein-calorie malnutrition Hypokalemia Delirium  Plan:  Medications reviewed. Will supplement Will continue according to neurology recommendation Discussed with case manager about discharge plan. The major challenge is that her mental status. She is confused and disoriented. She lives at home with her son and planned to [facility] back home. Her pneumonia and UTI are clinically improving.  I would like to get more input fro neurology regarding her confusion.  LOS: 8 days   Shlomo Seres 10/18/2014, 7:59 AM
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
## 2014-10-19 ENCOUNTER — Non-Acute Institutional Stay (SKILLED_NURSING_FACILITY): Payer: Medicaid Other | Admitting: Internal Medicine

## 2014-10-19 DIAGNOSIS — E43 Unspecified severe protein-calorie malnutrition: Secondary | ICD-10-CM

## 2014-10-19 DIAGNOSIS — T8351XA Infection and inflammatory reaction due to indwelling urinary catheter, initial encounter: Secondary | ICD-10-CM | POA: Diagnosis not present

## 2014-10-19 DIAGNOSIS — K913 Postprocedural intestinal obstruction: Secondary | ICD-10-CM | POA: Diagnosis not present

## 2014-10-19 DIAGNOSIS — N390 Urinary tract infection, site not specified: Secondary | ICD-10-CM

## 2014-10-19 DIAGNOSIS — E876 Hypokalemia: Secondary | ICD-10-CM | POA: Diagnosis not present

## 2014-10-19 DIAGNOSIS — R410 Disorientation, unspecified: Secondary | ICD-10-CM | POA: Diagnosis not present

## 2014-10-19 DIAGNOSIS — T83511A Infection and inflammatory reaction due to indwelling urethral catheter, initial encounter: Secondary | ICD-10-CM

## 2014-10-19 DIAGNOSIS — K9189 Other postprocedural complications and disorders of digestive system: Secondary | ICD-10-CM

## 2014-10-19 DIAGNOSIS — K567 Ileus, unspecified: Secondary | ICD-10-CM

## 2014-10-19 MED ORDER — LEVETIRACETAM 500 MG PO TABS: 1500.0000 mg | ORAL_TABLET | Freq: Two times a day (BID) | ORAL | Status: DC

## 2014-10-19 MED ORDER — AMOXICILLIN-POT CLAVULANATE 500-125 MG PO TABS: 1.0000 | ORAL_TABLET | Freq: Three times a day (TID) | ORAL | Status: DC

## 2014-10-19 MED ORDER — FUROSEMIDE 40 MG PO TABS: 20.0000 mg | ORAL_TABLET | Freq: Every day | ORAL | Status: DC

## 2014-10-19 NOTE — Clinical Social Work Placement (Signed)
   CLINICAL SOCIAL WORK PLACEMENT  NOTE  Date:  10/19/2014  Patient Details  Name: [name] MRN: [id number] Date of Birth: 1966-02-07  Clinical Social Work is seeking post-discharge placement for this patient at the [place] level of care (*CSW will initial, date and re-position this form in  chart as items are completed):  Yes   Patient/family provided with [place] Work Department's list of facilities offering this level of care within the geographic area requested by the patient (or if unable, by the patient's family).  Yes   Patient/family informed of their freedom to choose among providers that offer the needed level of care, that participate in Medicare, Medicaid or managed care program needed by the patient, have an available bed and are willing to accept the patient.  Yes   Patient/family informed of [facility]'s ownership interest in [facility] and [facility], as well as of the fact that they are under no obligation to receive care at these facilities.  PASRR submitted to EDS on       PASRR number received on       Existing PASRR number confirmed on 10/18/14     FL2 transmitted to all facilities in geographic area requested by pt/family on 10/18/14     FL2 transmitted to all facilities within larger geographic area on       Patient informed that his/her managed care company has contracts with or will negotiate with certain facilities, including the following:        Yes   Patient/family informed of bed offers received.  Patient chooses bed at [facility]     Physician recommends and patient chooses bed at      Patient to be transferred to [facility] on 10/19/14.  Patient to be transferred to facility by [facility]     Patient family notified on 10/19/14 of transfer.  Name of family member notified:  Valarie Merino- son     PHYSICIAN       Additional Comment:     _______________________________________________ Salome Arnt, LCSW 10/19/2014, 10:59 AM [phone]

## 2014-10-19 NOTE — Discharge Summary (Signed)
Physician Discharge Summary  Patient ID: [name] MRN: [id number] DOB/AGE: [birth year] 49 y.o. Primary Care Physician:Raeven Pint, MD Admit date: 10/09/2014 Discharge date: 10/19/2014    Discharge Diagnoses:   Principal Problem:   Sepsis Active Problems:   Paraplegia   Suprapubic catheter   Acute renal failure   Seizure disorder   UTI (lower urinary tract infection)   Encephalopathy acute   HCAP (healthcare-associated pneumonia)   Encephalomalacia   TBI (traumatic brain injury)   Severe protein-calorie malnutrition pneumonia Delirium  hypokalemia    Medication List    STOP taking these medications        ciprofloxacin 500 MG tablet  Commonly known as:  CIPRO      TAKE these medications        amLODipine 10 MG tablet  Commonly known as:  NORVASC  Take 1 tablet (10 mg total) by mouth daily.     amoxicillin-clavulanate 500-125 MG per tablet  Commonly known as:  AUGMENTIN  Take 1 tablet (500 mg total) by mouth 3 (three) times daily.     baclofen 20 MG tablet  Commonly known as:  LIORESAL  Take 1 tablet (20 mg total) by mouth 4 (four) times daily.     cloNIDine 0.2 MG tablet  Commonly known as:  CATAPRES  Take 1 tablet (0.2 mg total) by mouth 3 (three) times daily.     docusate sodium 100 MG capsule  Commonly known as:  COLACE  Take 100 mg by mouth at bedtime.     furosemide 40 MG tablet  Commonly known as:  LASIX  Take 0.5 tablets (20 mg total) by mouth daily.     gabapentin 300 MG capsule  Commonly known as:  NEURONTIN  Take 300 mg by mouth 3 (three) times daily.     levETIRAcetam 500 MG tablet  Commonly known as:  KEPPRA  Take 3 tablets (1,500 mg total) by mouth 2 (two) times daily.     metoprolol tartrate 25 MG tablet  Commonly known as:  LOPRESSOR  Take 1 tablet (25 mg total) by mouth 2 (two) times daily.     MULTIVITAMIN ADULT PO  Take by mouth daily.     pantoprazole 40 MG tablet  Commonly known as:  PROTONIX  Take 1 tablet (40  mg total) by mouth daily at 12 noon.     potassium chloride SA 20 MEQ tablet  Commonly known as:  K-DUR,KLOR-CON  Take 20 mEq by mouth daily.     TOVIAZ 8 MG Tb24 tablet  Generic drug:  fesoterodine  Take 8 mg by mouth daily.        Discharged Condition: nursing home    Consults: neurology  Significant Diagnostic Studies: Dg Chest 1 View  10/13/2014   CLINICAL DATA:  Pneumonia, weakness and shakiness, history of seizures, paraplegia, cancer.  EXAM: CHEST  1 VIEW  COMPARISON:  Portable chest x-ray of Oct 09, 2014  FINDINGS: The lungs are adequately inflated. The retrocardiac region remains dense and the left hemidiaphragm remains largely obscured. There is a small amount of pleural fluid layering laterally. The right lung is well-expanded. No significant atelectasis or infiltrate is demonstrated. The cardiac silhouette is top-normal in size. The pulmonary vascularity is not engorged. There are Harrington rods traversing the thoracic spine from approximately T4 -T11.  IMPRESSION: Mild interval improvement in the appearance of the chest since yesterday's study may largely be due to better inflation. There is persistent left basilar atelectasis or pneumonia with small pleural effusion.  Electronically Signed   By: David  Martinique M.D.   On: 10/13/2014 11:05   Ct Head Wo Contrast  10/17/2014   CLINICAL DATA:  Altered mental status, possible seizures and encephalopathy.  EXAM: CT HEAD WITHOUT CONTRAST  TECHNIQUE: Contiguous axial images were obtained from the base of the skull through the vertex without intravenous contrast.  COMPARISON:  10/10/2014 CT.  MR brain 05/03/2014.  FINDINGS: The patient was unable to remain motionless for the exam. Small or subtle lesions could be overlooked.  No evidence for acute infarction, hemorrhage, mass lesion, or extra-axial fluid. Moderate ventriculomegaly out of proportion to the degree of cortical atrophy, NPH not excluded. Slight prominence superior vermian  cistern. 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  Calvarium intact.  No visible sinus or mastoid air fluid level.  IMPRESSION: Motion degraded exam.  Moderate ventriculomegaly out of proportion to the degree of cortical volume loss. NPH not excluded.  Remote LEFT frontal infarct.  No acute intracranial findings.   Electronically Signed   By: Rolla Flatten M.D.   On: 10/17/2014 12:51   Ct Head Wo Contrast  10/10/2014   CLINICAL DATA:  Acute onset of fever and decreased level of consciousness. Possible seizure, with tremors and shaking. Initial encounter.  EXAM: CT HEAD WITHOUT CONTRAST  TECHNIQUE: Contiguous axial images were obtained from the base of the skull through the vertex without intravenous contrast.  COMPARISON:  CT of the head performed 05/02/2014, and MRI of the brain performed 10/04/2014  FINDINGS: There is no evidence of acute infarction, mass lesion, or intra- or extra-axial hemorrhage on CT.  Prominence of the ventricles suggests mild cortical volume loss, though as previously suggested, normal pressure hydrocephalus could have a similar appearance. 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  The posterior fossa, including the cerebellum, brainstem and fourth ventricle, is within normal limits. The basal ganglia are unremarkable in appearance. No mass effect or midline shift is seen.  There is no evidence of fracture; visualized osseous structures are unremarkable in appearance. The orbits are within normal limits. The paranasal sinuses and mastoid air cells are well-aerated. No significant soft tissue abnormalities are seen.  IMPRESSION: 1. No evidence of traumatic intracranial injury or fracture. 2. Mild cortical volume loss noted, though as previously suggested, normal pressure hydrocephalus could have a similar appearance. 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   Electronically Signed   By: Garald Balding M.D.   On: 10/10/2014 01:26   Mr Jeri Cos [id number]  Contrast  10/05/2014   [facility]  NEUROIMAGING REPORT   STUDY DATE: 10/04/14 PATIENT NAME: [name] DOB: 06-10-1965 MRN: [id number]  ORDERING CLINICIAN: Sarina Ill, MD  CLINICAL HISTORY: 49 year old female with abnormal MRI brain. Follow up  study.  EXAM: MRI brain (with and without)  TECHNIQUE: MRI of the brain with and without contrast was obtained  utilizing 5 mm axial slices with T1, T2, T2 flair, SWI and diffusion  weighted views.  T1 sagittal, T2 coronal and postcontrast views in the  axial and coronal plane were obtained. CONTRAST: 7ml multihance  IMAGING SITE: [facility] [address] (1.5 Tesla MRI)    FINDINGS:  No abnormal lesions are seen on diffusion-weighted views to suggest acute  ischemia.   Stable left frontal/parietal convexity, focal encephalomalacia with  gliosis, likely from prior ischemic infarction.  Few foci of hemosiderin deposition (chronic hemorrhage) in the left  frontal region, body of corpus callosum and  punctate focus in the left  periventricular white matter.   The remaining cortical sulci, fissures and cisterns are normal in size and  appearance. Lateral, third and fourth ventricle are notable for mild  ventriculomegaly. No extra-axial fluid collections are seen. No evidence  of mass effect or midline shift. No abnormal lesions are seen on post  contrast views.   On sagittal views the posterior fossa, pituitary gland and corpus callosum  are notable for severe corpus callosum atrophy. The orbits and their  contents, paranasal sinuses and calvarium are unremarkable. Intracranial  flow voids are present.    10/05/2014   Abnormal MRI brain (with and without) demonstrating: 1. Stable left frontal/parietal convexity, focal encephalomalacia with  gliosis, likely from chronic ischemic infarction. No evidence to suggest a  progressive neoplastic/inflammatory process at this time. 2. Stable foci of hemosiderin deposition (chronic hemorrhage)  in the left  frontal region, body of corpus callosum and punctate focus in the left  periventricular white matter. 3. Severe corpus callosum atrophy. Mild ventriculomegaly on ex vacuo  basis. 4. No acute findings. No abnormal enhancing lesions. 5. Overall no significant change from MRI on 07/06/14.     INTERPRETING PHYSICIAN:  Penni Bombard, MD Certified in Neurology, Neurophysiology and Neuroimaging  [facility] [address], [place], [place] [address] [phone]   Dg Chest [place] 1 View  10/09/2014   CLINICAL DATA:  Tremors. Fever for 2 days. Hypertension. Uterine cancer. Paralysis. Lethargy.  EXAM: PORTABLE CHEST - 1 VIEW  COMPARISON:  08/26/2014  FINDINGS: Harrington rods noted. Chronic nonunited right clavicular fracture. The patient is rotated to the left on today's radiograph, reducing diagnostic sensitivity and specificity. Moderate enlargement of the cardiopericardial silhouette.  Airspace opacity in the left lower lobe obscures the left hemidiaphragm. There is mild subsegmental atelectasis along the right hemidiaphragm.  Low lung volumes.  IMPRESSION: 1. The dominant finding is considerable left lower lobe airspace opacity obscuring the left hemidiaphragm, concerning for pneumonia or atelectasis. 2. Subsegmental atelectasis at the right lung base. 3. Moderate enlargement of the cardiopericardial silhouette.   Electronically Signed   By: Van Clines M.D.   On: 10/09/2014 21:06    Lab Results: Basic Metabolic Panel:  Recent Labs  10/17/14 0617 10/17/14 0645 10/18/14 0602  NA  --  140 140  K  --  3.4* 3.1*  CL  --  120* 120*  CO2  --  13* 13*  GLUCOSE  --  76 75  BUN  --  10 11  CREATININE  --  1.37* 1.31*  CALCIUM  --  7.4* 7.4*  MG 1.6*  --   --    Liver Function Tests: No results for input(s): AST, ALT, ALKPHOS, BILITOT, PROT, ALBUMIN in the last 72 hours.   CBC:  Recent Labs  10/17/14 0009  WBC 11.7*  HGB 9.2*  HCT 27.0*  MCV 86.3   PLT 226    Recent Results (from the past 240 hour(s))  Blood culture (routine x 2)     Status: None   Collection Time: 10/09/14  9:28 PM  Result Value Ref Range Status   Specimen Description BLOOD RIGHT ARM  Final   Special Requests BOTTLES DRAWN AEROBIC AND ANAEROBIC 6CC  Final   Culture NO GROWTH 5 DAYS  Final   Report Status 10/14/2014 FINAL  Final  Blood culture (routine x 2)     Status: None   Collection Time: 10/09/14  9:42 PM  Result Value Ref Range  Status   Specimen Description BLOOD RIGHT HAND  Final   Special Requests BOTTLES DRAWN AEROBIC ONLY 6CC  Final   Culture NO GROWTH 5 DAYS  Final   Report Status 10/14/2014 FINAL  Final  Urine culture     Status: None   Collection Time: 10/10/14 12:14 AM  Result Value Ref Range Status   Specimen Description URINE, CATHETERIZED  Final   Special Requests NONE  Final   Colony Count   Final    >=100,000 COLONIES/ML Performed at [facility]    Culture   Final    ESCHERICHIA COLI GROUP B STREP(S.AGALACTIAE)ISOLATED Note: TESTING AGAINST S. AGALACTIAE NOT ROUTINELY PERFORMED DUE TO PREDICTABILITY OF AMP/PEN/VAN SUSCEPTIBILITY. Performed at [facility]    Report Status 10/12/2014 FINAL  Final   Organism ID, Bacteria ESCHERICHIA COLI  Final      Susceptibility   Escherichia coli - MIC*    AMPICILLIN 8 SENSITIVE Sensitive     CEFAZOLIN <=4 SENSITIVE Sensitive     CEFTRIAXONE <=1 SENSITIVE Sensitive     CIPROFLOXACIN >=4 RESISTANT Resistant     GENTAMICIN <=1 SENSITIVE Sensitive     LEVOFLOXACIN >=8 RESISTANT Resistant     NITROFURANTOIN <=16 SENSITIVE Sensitive     TOBRAMYCIN <=1 SENSITIVE Sensitive     TRIMETH/SULFA >=320 RESISTANT Resistant     PIP/TAZO <=4 SENSITIVE Sensitive     * ESCHERICHIA COLI  MRSA PCR Screening     Status: None   Collection Time: 10/10/14  1:59 AM  Result Value Ref Range Status   MRSA by PCR NEGATIVE NEGATIVE Final    Comment:        The GeneXpert MRSA Assay (FDA approved for  NASAL specimens only), is one component of a comprehensive MRSA colonization surveillance program. It is not intended to diagnose MRSA infection nor to guide or monitor treatment for MRSA infections.      Hospital Course:   This is a 49 years old female with history of multiple medical illnesses was admitted due to changein mental status. On admission patient was found to be septic and hypotensive. She had both UTI and pneumonia. She grew resistant stain of E.coli in her urine. Patient was treated with combination IV antibiotics. Her pneumonia and sepsis improved but patient remained confused and disoriented. Patient has been followed by neurologist. Her seizure medications adjusted. Patient needs assistance with her ADL. She is going to be placed in nursing home.  Discharge Exam: Blood pressure 157/93, pulse 100, temperature 98.4 F (36.9 C), temperature source Oral, resp. rate 18, height 4\' 10"  (1.473 m), weight 87.544 kg (193 lb), last menstrual period 10/12/2014, SpO2 96 %.   Disposition:  Nursing home        Follow-up Information    Follow up with [place].   Contact information:   [address] [place] [address] [phone]       Signed: [name]   10/19/2014, 7:57 AM

## 2014-10-19 NOTE — Progress Notes (Signed)
NURSING PROGRESS NOTE  [name] [id number] Discharge Data: 10/19/2014 12:18 PM Attending Provider: No att. providers found [id number], [place] to be D/C'd Skilled nursing facility per MD order.    All IV's discontinued and monitored for bleeding.  All belongings returned to patient for patient to take home.  AVS summary included in packet for staff at SNF.  Called report to [facility].  Patient left floor via stretcher, escorted by EMS.  Last Documented Vital Signs:  Blood pressure 157/93, pulse 100, temperature 98.4 F (36.9 C), temperature source Oral, resp. rate 18, height 4\' 10"  (1.473 m), weight 87.544 kg (193 lb), last menstrual period 10/12/2014, SpO2 96 %.  [name] D

## 2014-10-19 NOTE — Care Management Note (Signed)
Case Management Note  Patient Details  Name: [name] MRN: [id number] Date of Birth: 03-25-1966  Subjective/Objective:                    Action/Plan:   Expected Discharge Date:                  Expected Discharge Plan:  [place]  In-House Referral:  Clinical Social Work  Discharge planning Services  CM Consult  Post Acute Care Choice:    Choice offered to:  Adult Children  DME Arranged:    DME Agency:     HH Arranged:    HH Agency:     Status of Service:  Completed, signed off  Medicare Important Message Given:  Yes Date Medicare IM Given:  10/14/14 Medicare IM give by:  Jolene Provost, RN, MSN, CM  Date Additional Medicare IM Given:    Additional Medicare Important Message give by:     If discussed at [place] of Stay Meetings, dates discussed:    Additional Comments: Pt discharged to [facility] today. CSW to arrange discharge to facility. [name] [place], RN 10/19/2014, 11:07 AM

## 2014-10-19 NOTE — Clinical Social Work Note (Signed)
Pt's son, [name] accepts bed at [facility]. Facility notified. D/C summary faxed. Pt to transfer via [facility]. Son will complete paperwork at [facility] this afternoon.  Benay Pike, [place]

## 2014-10-24 ENCOUNTER — Non-Acute Institutional Stay (SKILLED_NURSING_FACILITY): Payer: Medicaid Other | Admitting: Internal Medicine

## 2014-10-24 DIAGNOSIS — R410 Disorientation, unspecified: Secondary | ICD-10-CM

## 2014-10-24 DIAGNOSIS — E43 Unspecified severe protein-calorie malnutrition: Secondary | ICD-10-CM | POA: Diagnosis not present

## 2014-10-24 DIAGNOSIS — E876 Hypokalemia: Secondary | ICD-10-CM | POA: Diagnosis not present
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
## 2014-10-26 ENCOUNTER — Non-Acute Institutional Stay (SKILLED_NURSING_FACILITY): Payer: Medicaid Other | Admitting: Internal Medicine

## 2014-10-26 DIAGNOSIS — L89310 Pressure ulcer of right buttock, unstageable: Secondary | ICD-10-CM | POA: Diagnosis not present

## 2014-10-26 DIAGNOSIS — F329 Major depressive disorder, single episode, unspecified: Secondary | ICD-10-CM | POA: Diagnosis not present

## 2014-10-26 DIAGNOSIS — F32A Depression, unspecified: Secondary | ICD-10-CM

## 2014-10-26 DIAGNOSIS — E43 Unspecified severe protein-calorie malnutrition: Secondary | ICD-10-CM
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
## 2014-10-27 ENCOUNTER — Other Ambulatory Visit: Payer: Self-pay | Admitting: Radiology

## 2014-10-27 ENCOUNTER — Other Ambulatory Visit (HOSPITAL_COMMUNITY)
Admission: RE | Admit: 2014-10-27 | Discharge: 2014-10-27 | Disposition: A | Discharge status: Home / Self Care | Payer: Medicaid Other | Source: Ambulatory Visit | Attending: Internal Medicine | Admitting: Internal Medicine

## 2014-10-27 ENCOUNTER — Other Ambulatory Visit: Payer: Self-pay | Admitting: Internal Medicine

## 2014-10-27 DIAGNOSIS — N179 Acute kidney failure, unspecified: Secondary | ICD-10-CM | POA: Insufficient documentation

## 2014-10-27 DIAGNOSIS — G8220 Paraplegia, unspecified: Secondary | ICD-10-CM | POA: Diagnosis not present

## 2014-10-27 DIAGNOSIS — R633 Feeding difficulties: Secondary | ICD-10-CM

## 2014-10-27 DIAGNOSIS — R6339 Other feeding difficulties: Secondary | ICD-10-CM

## 2014-10-27 DIAGNOSIS — R410 Disorientation, unspecified: Secondary | ICD-10-CM | POA: Diagnosis not present

## 2014-10-27 DIAGNOSIS — E46 Unspecified protein-calorie malnutrition: Secondary | ICD-10-CM

## 2014-10-27 DIAGNOSIS — S42011A Anterior displaced fracture of sternal end of right clavicle, initial encounter for closed fracture: Secondary | ICD-10-CM | POA: Diagnosis present

## 2014-10-27 DIAGNOSIS — G40909 Epilepsy, unspecified, not intractable, without status epilepticus: Secondary | ICD-10-CM | POA: Insufficient documentation

## 2014-10-27 DIAGNOSIS — N390 Urinary tract infection, site not specified: Secondary | ICD-10-CM | POA: Insufficient documentation

## 2014-10-27 LAB — APTT: aPTT: 30 seconds (ref 24–37)

## 2014-10-27 LAB — PROTIME-INR
INR: 1.05 (ref 0.00–1.49)
PROTHROMBIN TIME: 13.9 s (ref 11.6–15.2)

## 2014-10-28 ENCOUNTER — Ambulatory Visit (HOSPITAL_COMMUNITY)
Admission: RE | Admit: 2014-10-28 | Discharge: 2014-10-28 | Disposition: A | Discharge status: Home / Self Care | Payer: Medicaid Other | Source: Ambulatory Visit | Attending: Internal Medicine | Admitting: Internal Medicine

## 2014-10-28 DIAGNOSIS — R633 Feeding difficulties: Secondary | ICD-10-CM

## 2014-10-28 DIAGNOSIS — R6339 Other feeding difficulties: Secondary | ICD-10-CM

## 2014-10-28 DIAGNOSIS — G8220 Paraplegia, unspecified: Secondary | ICD-10-CM | POA: Diagnosis not present

## 2014-10-28 DIAGNOSIS — I10 Essential (primary) hypertension: Secondary | ICD-10-CM | POA: Diagnosis not present

## 2014-10-28 DIAGNOSIS — E8809 Other disorders of plasma-protein metabolism, not elsewhere classified: Secondary | ICD-10-CM | POA: Diagnosis not present

## 2014-10-28 DIAGNOSIS — G40909 Epilepsy, unspecified, not intractable, without status epilepticus: Secondary | ICD-10-CM | POA: Diagnosis not present

## 2014-10-28 DIAGNOSIS — E46 Unspecified protein-calorie malnutrition: Secondary | ICD-10-CM | POA: Diagnosis present

## 2014-10-28 DIAGNOSIS — Z79899 Other long term (current) drug therapy: Secondary | ICD-10-CM | POA: Insufficient documentation

## 2014-10-28 DIAGNOSIS — Z6825 Body mass index (BMI) 25.0-25.9, adult: Secondary | ICD-10-CM | POA: Diagnosis not present

## 2014-10-28 LAB — CBC
HEMATOCRIT: 30.8 % — AB (ref 36.0–46.0)
Hemoglobin: 10.1 g/dL — ABNORMAL LOW (ref 12.0–15.0)
MCH: 29.4 pg (ref 26.0–34.0)
MCHC: 32.8 g/dL (ref 30.0–36.0)
MCV: 89.5 fL (ref 78.0–100.0)
Platelets: 230 10*3/uL (ref 150–400)
RBC: 3.44 MIL/uL — ABNORMAL LOW (ref 3.87–5.11)
RDW: 15.8 % — AB (ref 11.5–15.5)
WBC: 7.6 10*3/uL (ref 4.0–10.5)

## 2014-10-28 LAB — BASIC METABOLIC PANEL
ANION GAP: 6 (ref 5–15)
CHLORIDE: 109 mmol/L (ref 101–111)
CO2: 24 mmol/L (ref 22–32)
CREATININE: 0.75 mg/dL (ref 0.44–1.00)
Calcium: 7.7 mg/dL — ABNORMAL LOW (ref 8.9–10.3)
GFR calc Af Amer: >60 mL/min (ref >60)
GFR calc non Af Amer: >60 mL/min (ref >60)
Glucose, Bld: 100 mg/dL — ABNORMAL HIGH (ref 65–99)
Potassium: 3.3 mmol/L — ABNORMAL LOW (ref 3.5–5.1)
Sodium: 139 mmol/L (ref 135–145)

## 2014-10-28 LAB — PROTIME-INR
INR: 1 (ref 0.00–1.49)
Prothrombin Time: 13.4 seconds (ref 11.6–15.2)

## 2014-10-28 LAB — APTT: APTT: 26 s (ref 24–37)

## 2014-10-28 MED ORDER — SODIUM CHLORIDE 0.9 % IV SOLN: Freq: Once | INTRAVENOUS | Status: DC

## 2014-10-28 MED ORDER — SODIUM CHLORIDE 0.9 % IV SOLN
INTRAVENOUS | Status: AC | PRN
  Administered 2014-10-28: 10 mL/h via INTRAVENOUS

## 2014-10-28 MED ORDER — FENTANYL CITRATE (PF) 100 MCG/2ML IJ SOLN
INTRAMUSCULAR | Status: AC | PRN
  Administered 2014-10-28: 50 ug via INTRAVENOUS
  Administered 2014-10-28 (×2): 25 ug via INTRAVENOUS

## 2014-10-28 MED ORDER — GLUCAGON HCL (RDNA) 1 MG IJ SOLR
INTRAMUSCULAR | Status: AC | PRN
  Administered 2014-10-28: 1 mg via INTRAVENOUS

## 2014-10-28 MED ORDER — IOHEXOL 300 MG/ML  SOLN
50.0000 mL | Freq: Once | INTRAMUSCULAR | Status: AC | PRN
  Administered 2014-10-28: 10 mL via INTRAVENOUS

## 2014-10-28 MED ORDER — MIDAZOLAM HCL 2 MG/2ML IJ SOLN
INTRAMUSCULAR | Status: AC | PRN
  Administered 2014-10-28 (×2): 0.5 mg via INTRAVENOUS
  Administered 2014-10-28: 1 mg via INTRAVENOUS

## 2014-10-28 MED ORDER — LIDOCAINE HCL 1 % IJ SOLN
INTRAMUSCULAR | Status: AC
  Filled 2014-10-28: qty 20

## 2014-10-28 MED ORDER — GLUCAGON HCL RDNA (DIAGNOSTIC) 1 MG IJ SOLR
INTRAMUSCULAR | Status: AC
  Filled 2014-10-28: qty 1

## 2014-10-28 MED ORDER — CEFAZOLIN SODIUM-DEXTROSE 2-3 GM-% IV SOLR
2.0000 g | Freq: Once | INTRAVENOUS | Status: AC
  Administered 2014-10-28: 2 g via INTRAVENOUS

## 2014-10-28 MED ORDER — FENTANYL CITRATE (PF) 100 MCG/2ML IJ SOLN
INTRAMUSCULAR | Status: AC
  Filled 2014-10-28: qty 2

## 2014-10-28 MED ORDER — MIDAZOLAM HCL 2 MG/2ML IJ SOLN
INTRAMUSCULAR | Status: AC
  Filled 2014-10-28: qty 2

## 2014-10-28 MED ORDER — CEFAZOLIN SODIUM-DEXTROSE 2-3 GM-% IV SOLR
INTRAVENOUS | Status: AC
Start: 2014-10-28 — End: 2014-10-28
  Administered 2014-10-28: 2 g via INTRAVENOUS
  Filled 2014-10-28: qty 50
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
## 2014-10-28 NOTE — Procedures (Signed)
20 Fr G tube No comp

## 2014-10-28 NOTE — Discharge Instructions (Signed)
Gastrostomy Tube Home Guide  A gastrostomy tube is a tube that is surgically placed into the stomach. It is also called a "G-tube." G-tubes are used when a person is unable to eat and drink enough on their own to stay healthy. The tube is inserted into the stomach through a small cut (incision) in the skin. This tube is used for:   Feeding.   Giving medication.  GASTROSTOMY TUBE CARE   Wash your hands with soap and water.   Remove the old dressing (if any). Some styles of G-tubes may need a dressing inserted between the skin and the G-tube. Other types of G-tubes do not require a dressing. Ask your health care provider if a dressing is needed.   Check the area where the tube enters the skin (insertion site) for redness, swelling, or pus-like (purulent) drainage. A small amount of clear or tan liquid drainage is normal. Check to make sure scar tissue (skin) is not growing around the insertion site. This could have a raised, bumpy appearance.   A cotton swab can be used to clean the skin around the tube:   When the G-tube is first put in, a normal saline solution or water can be used to clean the skin.   Mild soap and warm water can be used when the skin around the G-tube site has healed.   Roll the cotton swab around the G-tube insertion site to remove any drainage or crusting at the insertion site.  STOMACH RESIDUALS  Feeding tube residuals are the amount of liquids that are in the stomach at any given time. Residuals may be checked before giving feedings, medications, or as instructed by your health care provider.   Ask your health care provider if there are instances when you would not start tube feedings depending on the amount or type of contents withdrawn from the stomach.   Check residuals by attaching a syringe to the G-tube and pulling back on the syringe plunger. Note the amount, and return the residual back into the stomach.  FLUSHING THE G-TUBE   The G-tube should be periodically flushed with  clean warm water to keep it from clogging.   Flush the G-tube after feedings or medications. Draw up 30 mL of warm water in a syringe. Connect the syringe to the G-tube and slowly push the water into the tube.   Do not push feedings, medications, or flushes rapidly. Flush the G-tube gently and slowly.   Only use syringes made for G-tubes to flush medications or feedings.   Your health care provider may want the G-tube flushed more often or with more water. If this is the case, follow your health care provider's instructions.  FEEDINGS  Your health care provider will determine whether feedings are given as a bolus (a certain amount given at one time and at scheduled times) or whether feedings will be given continuously on a feeding pump.    Formulas should be given at room temperature.   If feedings are continuous, no more than 4 hours worth of feedings should be placed in the feeding bag. This helps prevent spoilage or accidental excess infusion.   Cover and place unused formula in the refrigerator.   If feedings are continuous, stop the feedings when medications or flushes are given. Be sure to restart the feedings.   Feeding bags and syringes should be replaced as instructed by your health care provider.  GIVING MEDICATION    In general, it is best if all medications   are in a liquid form for G-tube administration. Liquid medications are less likely to clog the G-tube.   Mix the liquid medication with 30 mL (or amount recommended by your health care provider) of warm water.   Draw up the medication into the syringe.   Attach the syringe to the G-tube and slowly push the mixture into the G-tube.   After giving the medication, draw up 30 mL of warm water in the syringe and slowly flush the G-tube.   For pills or capsules, check with your health care provider first before crushing medications. Some pills are not effective if they are crushed. Some capsules are sustained-release medications.   If  appropriate, crush the pill or capsule and mix with 30 mL of warm water. Using the syringe, slowly push the medication through the tube, then flush the tube with another 30 mL of tap water.  G-TUBE PROBLEMS  G-tube was pulled out.   Cause: May have been pulled out accidentally.   Solutions: Cover the opening with clean dressing and tape. Call your health care provider right away. The G-tube should be put in as soon as possible (within 4 hours) so the G-tube opening (tract) does not close. The G-tube needs to be put in at a health care setting. An X-ray needs to be done to confirm placement before the G-tube can be used again.  Redness, irritation, soreness, or foul odor around the gastrostomy site.   Cause: May be caused by leakage or infection.   Solutions: Call your health care provider right away.  Large amount of leakage of fluid or mucus-like liquid present (a large amount means it soaks clothing).   Cause: Many reasons could cause the G-tube to leak.   Solutions: Call your health care provider to discuss the amount of leakage.  Skin or scar tissue appears to be growing where tube enters skin.    Cause: Tissue growth may develop around the insertion site if the G-tube is moved or pulled on excessively.   Solutions: Secure tube with tape so that excess movement does not occur. Call your health care provider.  G-tube is clogged.   Cause: Thick formula or medication.   Solutions: Try to slowly push warm water into the tube with a large syringe. Never try to push any object into the tube to unclog it. Do not force fluid into the G-tube. If you are unable to unclog the tube, call your health care provider right away.  TIPS   Head of bed (HOB) position refers to the upright position of a person's upper body.   When giving medications or a feeding bolus, keep the HOB up as told by your health care provider. Do this during the feeding and for 1 hour after the feeding or medication administration.   If  continuous feedings are being given, it is best to keep the HOB up as told by your health care provider. When ADLs (activities of daily living) are performed and the HOB needs to be flat, be sure to turn the feeding pump off. Restart the feeding pump when the HOB is returned to the recommended height.   Do not pull or put tension on the tube.   To prevent fluid backflow, kink the G-tube before removing the cap or disconnecting a syringe.   Check the G-tube length every day. Measure from the insertion site to the end of the G-tube. If the length is longer than previous measurements, the tube may be coming out. Call   your health care provider if you notice increasing G-tube length.   Oral care, such as brushing teeth, must be continued.   You may need to remove excess air (vent) from the G-tube. Your health care provider will tell you if this is needed.   Always call your health care provider if you have questions or problems with the G-tube.  SEEK IMMEDIATE MEDICAL CARE IF:    You have severe abdominal pain, tenderness, or abdominal bloating (distension).   You have nausea or vomiting.   You are constipated or have problems moving your bowels.   The G-tube insertion site is red, swollen, has a foul smell, or has yellow or brown drainage.   You have difficulty breathing or shortness of breath.   You have a fever.   You have a large amount of feeding tube residuals.   The G-tube is clogged and cannot be flushed.  MAKE SURE YOU:    Understand these instructions.   Will watch your condition.   Will get help right away if you are not doing well or get worse.  Document Released: 07/22/2001 Document Revised: 09/27/2013 Document Reviewed: 01/18/2013  [employer] Patient Information 2015 [employer]. This information is not intended to replace advice given to you by your health care provider. Make sure you discuss any questions you have with your health care provider.

## 2014-11-03 ENCOUNTER — Emergency Department (HOSPITAL_COMMUNITY): Payer: Medicaid Other

## 2014-11-03 ENCOUNTER — Emergency Department (HOSPITAL_COMMUNITY)
Admission: EM | Admit: 2014-11-03 | Discharge: 2014-11-03 | Disposition: A | Discharge status: Home / Self Care | Payer: Medicaid Other | Attending: Emergency Medicine | Admitting: Emergency Medicine

## 2014-11-03 ENCOUNTER — Encounter (HOSPITAL_COMMUNITY): Payer: Self-pay | Admitting: Emergency Medicine

## 2014-11-03 DIAGNOSIS — M6250 Muscle wasting and atrophy, not elsewhere classified, unspecified site: Secondary | ICD-10-CM | POA: Diagnosis not present

## 2014-11-03 DIAGNOSIS — Z8774 Personal history of (corrected) congenital malformations of heart and circulatory system: Secondary | ICD-10-CM | POA: Diagnosis not present

## 2014-11-03 DIAGNOSIS — R569 Unspecified convulsions: Secondary | ICD-10-CM | POA: Diagnosis present

## 2014-11-03 DIAGNOSIS — Z79899 Other long term (current) drug therapy: Secondary | ICD-10-CM | POA: Diagnosis not present

## 2014-11-03 DIAGNOSIS — G8220 Paraplegia, unspecified: Secondary | ICD-10-CM | POA: Insufficient documentation

## 2014-11-03 DIAGNOSIS — Z8542 Personal history of malignant neoplasm of other parts of uterus: Secondary | ICD-10-CM | POA: Insufficient documentation

## 2014-11-03 DIAGNOSIS — G40909 Epilepsy, unspecified, not intractable, without status epilepticus: Secondary | ICD-10-CM

## 2014-11-03 DIAGNOSIS — G40919 Epilepsy, unspecified, intractable, without status epilepticus: Secondary | ICD-10-CM | POA: Insufficient documentation

## 2014-11-03 LAB — CBC WITH DIFFERENTIAL/PLATELET
BASOS PCT: 0 % (ref 0–1)
Basophils Absolute: 0 10*3/uL (ref 0.0–0.1)
EOS ABS: 0.4 10*3/uL (ref 0.0–0.7)
Eosinophils Relative: 6 % — ABNORMAL HIGH (ref 0–5)
HCT: 26.1 % — ABNORMAL LOW (ref 36.0–46.0)
Hemoglobin: 8.3 g/dL — ABNORMAL LOW (ref 12.0–15.0)
LYMPHS ABS: 1.7 10*3/uL (ref 0.7–4.0)
Lymphocytes Relative: 26 % (ref 12–46)
MCH: 30.2 pg (ref 26.0–34.0)
MCHC: 31.8 g/dL (ref 30.0–36.0)
MCV: 94.9 fL (ref 78.0–100.0)
MONO ABS: 0.5 10*3/uL (ref 0.1–1.0)
Monocytes Relative: 7 % (ref 3–12)
Neutro Abs: 4.1 10*3/uL (ref 1.7–7.7)
Neutrophils Relative %: 61 % (ref 43–77)
PLATELETS: 235 10*3/uL (ref 150–400)
RBC: 2.75 MIL/uL — AB (ref 3.87–5.11)
RDW: 15.6 % — AB (ref 11.5–15.5)
WBC: 6.7 10*3/uL (ref 4.0–10.5)

## 2014-11-03 LAB — COMPREHENSIVE METABOLIC PANEL
ALK PHOS: 68 U/L (ref 38–126)
ALT: 12 U/L — ABNORMAL LOW (ref 14–54)
AST: 20 U/L (ref 15–41)
Albumin: 1.4 g/dL — ABNORMAL LOW (ref 3.5–5.0)
Anion gap: 4 — ABNORMAL LOW (ref 5–15)
BILIRUBIN TOTAL: 0.4 mg/dL (ref 0.3–1.2)
BUN: 22 mg/dL — ABNORMAL HIGH (ref 6–20)
CO2: 25 mmol/L (ref 22–32)
CREATININE: 0.84 mg/dL (ref 0.44–1.00)
Calcium: 7.5 mg/dL — ABNORMAL LOW (ref 8.9–10.3)
Chloride: 106 mmol/L (ref 101–111)
GFR calc non Af Amer: >60 mL/min (ref >60)
GLUCOSE: 115 mg/dL — AB (ref 65–99)
Potassium: 4.7 mmol/L (ref 3.5–5.1)
SODIUM: 135 mmol/L (ref 135–145)
Total Protein: 4.4 g/dL — ABNORMAL LOW (ref 6.5–8.1)

## 2014-11-03 LAB — CBG MONITORING, ED: GLUCOSE-CAPILLARY: 105 mg/dL — AB (ref 65–99)

## 2014-11-03 LAB — URINALYSIS, ROUTINE W REFLEX MICROSCOPIC
Bilirubin Urine: NEGATIVE
Glucose, UA: 250 mg/dL — AB
Ketones, ur: NEGATIVE mg/dL
NITRITE: POSITIVE — AB
SPECIFIC GRAVITY, URINE: 1.02 (ref 1.005–1.030)
UROBILINOGEN UA: 0.2 mg/dL (ref 0.0–1.0)
pH: 7.5 (ref 5.0–8.0)

## 2014-11-03 LAB — URINE MICROSCOPIC-ADD ON

## 2014-11-03 LAB — ETHANOL: Alcohol, Ethyl (B): <5 mg/dL (ref <5)

## 2014-11-03 NOTE — ED Notes (Signed)
[facility] called for d/c transport.

## 2014-11-03 NOTE — ED Notes (Signed)
Report given to Levada Dy at [facility] [phone].

## 2014-11-03 NOTE — ED Notes (Signed)
[facility] here to transport pt, pt had large bowel movement, pt changed, placed in gown,

## 2014-11-03 NOTE — Discharge Instructions (Signed)
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
## 2014-11-03 NOTE — ED Notes (Signed)
Patient asked if son could get her something to eat, MD informed, meals and fluids were ok with MD. Patient informed

## 2014-11-03 NOTE — ED Notes (Signed)
Patient requested that family be called and made aware of arrival to hospital; Son was called at [phone]) with no answer, message left.

## 2014-11-03 NOTE — ED Notes (Signed)
Per EMS- [place] reports seizure this am. Pt has history of epilepsy. Denies pain. No fall. Pt alert and oriented x 4.

## 2014-11-09 ENCOUNTER — Non-Acute Institutional Stay (SKILLED_NURSING_FACILITY): Payer: Medicaid Other | Admitting: Internal Medicine

## 2014-11-09 DIAGNOSIS — L89310 Pressure ulcer of right buttock, unstageable: Secondary | ICD-10-CM | POA: Diagnosis not present

## 2014-11-09 DIAGNOSIS — E43 Unspecified severe protein-calorie malnutrition: Secondary | ICD-10-CM | POA: Diagnosis not present

## 2014-11-09 DIAGNOSIS — N049 Nephrotic syndrome with unspecified morphologic changes: Secondary | ICD-10-CM
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
## 2014-11-16 ENCOUNTER — Non-Acute Institutional Stay (SKILLED_NURSING_FACILITY): Payer: Medicaid Other | Admitting: Internal Medicine

## 2014-11-16 DIAGNOSIS — E43 Unspecified severe protein-calorie malnutrition: Secondary | ICD-10-CM

## 2014-11-16 DIAGNOSIS — N049 Nephrotic syndrome with unspecified morphologic changes: Secondary | ICD-10-CM

## 2014-11-18 ENCOUNTER — Emergency Department (HOSPITAL_COMMUNITY)
Admission: EM | Admit: 2014-11-18 | Discharge: 2014-11-18 | Disposition: A | Discharge status: Home / Self Care | Payer: Medicaid Other | Attending: Emergency Medicine | Admitting: Emergency Medicine

## 2014-11-18 ENCOUNTER — Encounter (HOSPITAL_COMMUNITY): Payer: Self-pay

## 2014-11-18 ENCOUNTER — Emergency Department (HOSPITAL_COMMUNITY): Payer: Medicaid Other

## 2014-11-18 DIAGNOSIS — G43909 Migraine, unspecified, not intractable, without status migrainosus: Secondary | ICD-10-CM | POA: Insufficient documentation

## 2014-11-18 DIAGNOSIS — N3000 Acute cystitis without hematuria: Secondary | ICD-10-CM | POA: Insufficient documentation

## 2014-11-18 DIAGNOSIS — Z79899 Other long term (current) drug therapy: Secondary | ICD-10-CM | POA: Insufficient documentation

## 2014-11-18 DIAGNOSIS — Z8541 Personal history of malignant neoplasm of cervix uteri: Secondary | ICD-10-CM | POA: Insufficient documentation

## 2014-11-18 DIAGNOSIS — R4182 Altered mental status, unspecified: Secondary | ICD-10-CM | POA: Diagnosis present

## 2014-11-18 DIAGNOSIS — Z8742 Personal history of other diseases of the female genital tract: Secondary | ICD-10-CM | POA: Diagnosis not present

## 2014-11-18 DIAGNOSIS — R569 Unspecified convulsions: Secondary | ICD-10-CM | POA: Diagnosis not present

## 2014-11-18 LAB — CBC WITH DIFFERENTIAL/PLATELET
Basophils Absolute: 0 10*3/uL (ref 0.0–0.1)
Basophils Relative: 0 % (ref 0–1)
Eosinophils Absolute: 0.4 10*3/uL (ref 0.0–0.7)
Eosinophils Relative: 5 % (ref 0–5)
HCT: 27.1 % — ABNORMAL LOW (ref 36.0–46.0)
Hemoglobin: 8.9 g/dL — ABNORMAL LOW (ref 12.0–15.0)
Lymphocytes Relative: 22 % (ref 12–46)
Lymphs Abs: 2 10*3/uL (ref 0.7–4.0)
MCH: 30 pg (ref 26.0–34.0)
MCHC: 32.8 g/dL (ref 30.0–36.0)
MCV: 91.2 fL (ref 78.0–100.0)
MONO ABS: 0.4 10*3/uL (ref 0.1–1.0)
Monocytes Relative: 5 % (ref 3–12)
Neutro Abs: 6.1 10*3/uL (ref 1.7–7.7)
Neutrophils Relative %: 68 % (ref 43–77)
Platelets: 243 10*3/uL (ref 150–400)
RBC: 2.97 MIL/uL — ABNORMAL LOW (ref 3.87–5.11)
RDW: 14.7 % (ref 11.5–15.5)
WBC: 8.9 10*3/uL (ref 4.0–10.5)

## 2014-11-18 LAB — COMPREHENSIVE METABOLIC PANEL
ALT: 13 U/L — AB (ref 14–54)
AST: 14 U/L — ABNORMAL LOW (ref 15–41)
Albumin: 1.5 g/dL — ABNORMAL LOW (ref 3.5–5.0)
Alkaline Phosphatase: 85 U/L (ref 38–126)
Anion gap: 4 — ABNORMAL LOW (ref 5–15)
BUN: 26 mg/dL — ABNORMAL HIGH (ref 6–20)
CALCIUM: 7.9 mg/dL — AB (ref 8.9–10.3)
CO2: 22 mmol/L (ref 22–32)
CREATININE: 1.45 mg/dL — AB (ref 0.44–1.00)
Chloride: 114 mmol/L — ABNORMAL HIGH (ref 101–111)
GFR calc Af Amer: 49 mL/min — ABNORMAL LOW (ref >60)
GFR, EST NON AFRICAN AMERICAN: 42 mL/min — AB (ref >60)
Glucose, Bld: 104 mg/dL — ABNORMAL HIGH (ref 65–99)
Potassium: 4.4 mmol/L (ref 3.5–5.1)
Sodium: 140 mmol/L (ref 135–145)
Total Bilirubin: 0.4 mg/dL (ref 0.3–1.2)
Total Protein: 4.9 g/dL — ABNORMAL LOW (ref 6.5–8.1)

## 2014-11-18 LAB — URINALYSIS, ROUTINE W REFLEX MICROSCOPIC
BILIRUBIN URINE: NEGATIVE
Glucose, UA: 250 mg/dL — AB
Ketones, ur: NEGATIVE mg/dL
Nitrite: NEGATIVE
Protein, ur: >300 mg/dL — AB
Specific Gravity, Urine: 1.02 (ref 1.005–1.030)
UROBILINOGEN UA: 0.2 mg/dL (ref 0.0–1.0)
pH: 7 (ref 5.0–8.0)

## 2014-11-18 LAB — URINE MICROSCOPIC-ADD ON

## 2014-11-18 LAB — LACTIC ACID, PLASMA: Lactic Acid, Venous: 0.9 mmol/L (ref 0.5–2.0)

## 2014-11-18 MED ORDER — CEPHALEXIN 500 MG PO CAPS: 500.0000 mg | ORAL_CAPSULE | Freq: Three times a day (TID) | ORAL | Status: DC

## 2014-11-18 MED ORDER — CEFTRIAXONE SODIUM 1 G IJ SOLR
1.0000 g | Freq: Once | INTRAMUSCULAR | Status: AC
  Administered 2014-11-18: 1 g via INTRAMUSCULAR
  Filled 2014-11-18: qty 10

## 2014-11-18 MED ORDER — LIDOCAINE HCL (PF) 1 % IJ SOLN
INTRAMUSCULAR | Status: AC
  Filled 2014-11-18: qty 5

## 2014-11-18 NOTE — ED Notes (Signed)
Pt sent via ems from [place] for ?? Seizure ?? Stroke ?? Altered mental status.  Pt is awake but appears sleepy.

## 2014-11-18 NOTE — ED Notes (Signed)
Pt. Oriented to self only. Pt. Denies pain. Pt. Reports "feeling bad" but unable to report any specific symptoms.

## 2014-11-18 NOTE — ED Notes (Signed)
Pt. Taken to CT by RN.

## 2014-11-18 NOTE — ED Notes (Signed)
Spoke with staff member from [facility] who reports that she entered the pts. Room and found that the pt. Was "not acting right". Reports facial droop and twitching and pt. Unable to move her left arm. Reports that pt. Typically has some weakness in left arm.
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
## 2014-11-18 NOTE — ED Notes (Signed)
Suprapubic catheter replaced by Dr. Venora Maples.

## 2014-11-19 ENCOUNTER — Non-Acute Institutional Stay (SKILLED_NURSING_FACILITY): Payer: Medicaid Other | Admitting: Internal Medicine

## 2014-11-19 DIAGNOSIS — N390 Urinary tract infection, site not specified: Secondary | ICD-10-CM

## 2014-11-19 DIAGNOSIS — R809 Proteinuria, unspecified: Secondary | ICD-10-CM | POA: Diagnosis not present

## 2014-11-19 DIAGNOSIS — T8351XA Infection and inflammatory reaction due to indwelling urinary catheter, initial encounter: Secondary | ICD-10-CM | POA: Diagnosis not present

## 2014-11-19 DIAGNOSIS — T83511A Infection and inflammatory reaction due to indwelling urethral catheter, initial encounter: Secondary | ICD-10-CM

## 2014-11-19 DIAGNOSIS — R410 Disorientation, unspecified: Secondary | ICD-10-CM | POA: Diagnosis not present

## 2014-11-19 NOTE — Progress Notes (Signed)
Patient ID: [name], female   DOB: 1965/12/18, 49 y.o.   MRN: [id number] Facility; [facility] Chief complaint; lethargic; slurred speech, left arm weakness and edema.  History; this patient was over and [place] on 6/24 with the above complaints. 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 She had a urinalysis showing many bacteria along with blood and proteinuria which is been a chronic pattern for her nevertheless she was felt to have a UTI started on Keflex. I've checked today the urine culture is still pending. I am seeing her in follow-up for this      Study Result       CLINICAL DATA:  Acute onset of altered mental status. Lethargy. Left arm weakness and left facial droop. Initial encounter.   EXAM: CT HEAD WITHOUT CONTRAST   TECHNIQUE: Contiguous axial images were obtained from the base of the skull through the vertex without intravenous contrast.   COMPARISON:  CT of the head performed 10/17/2014   FINDINGS: There is no evidence of acute infarction, mass lesion, or intra- or extra-axial hemorrhage on CT.   Prominence of the supratentorial ventricles remains out of proportion to sulcal prominence, raising question for mild hydrocephalus. Chronic encephalomalacia is noted at the high left parietal lobe, reflecting remote infarct. Mild cerebellar atrophy is noted.   The brainstem and fourth ventricle are within normal limits. The basal ganglia are unremarkable in appearance. No mass effect or midline shift is seen.   There is no evidence of fracture; visualized osseous structures are unremarkable in appearance. The visualized portions of the orbits are within normal limits. The paranasal sinuses and mastoid air cells are well-aerated. No significant soft tissue abnormalities are seen.   IMPRESSION: 1. No acute intracranial pathology seen on CT. 2. Stable appearance to supratentorial ventricular prominence, raising  question for mild hydrocephalus. 3. Chronic encephalomalacia at the high left parietal lobe, reflecting remote infarct.    Results for [name] (MRN [id number]) as of 11/19/2014 13:26  Ref. Range 10/18/2014 06:02 10/27/2014 17:45 10/28/2014 07:55 11/03/2014 16:40 11/18/2014 02:44  Sodium Latest Ref Range: 135-145 mmol/L 140  139 135 140  Potassium Latest Ref Range: 3.5-5.1 mmol/L 3.1 (L)  3.3 (L) 4.7 4.4  Chloride Latest Ref Range: 101-111 mmol/L 120 (H)  109 106 114 (H)  CO2 Latest Ref Range: 22-32 mmol/L 13 (L)  24 25 22   BUN Latest Ref Range: 6-20 mg/dL 11  <5 (L) 22 (H) 26 (H)  Creatinine Latest Ref Range: 0.44-1.00 mg/dL 1.31 (H)  0.75 0.84 1.45 (H)  Calcium Latest Ref Range: 8.9-10.3 mg/dL 7.4 (L)  7.7 (L) 7.5 (L) 7.9 (L)  EGFR (Non-African Amer.) Latest Ref Range: >60 mL/min 48 (L)  >60 >60 42 (L)  EGFR (African American) Latest Ref Range: >60 mL/min 56 (L)  >60 >60 49 (L)  Glucose Latest Ref Range: 65-99 mg/dL 75  100 (H) 115 (H) 104 (H)  Anion gap Latest Ref Range: 5-15  7  6 4  (L) 4 (L)  Alkaline Phosphatase Latest Ref Range: 38-126 U/L    68 85  Albumin Latest Ref Range: 3.5-5.0 g/dL    1.4 (L) 1.5 (L)  AST Latest Ref Range: 15-41 U/L    20 14 (L)  ALT Latest Ref Range: 14-54 U/L    12 (L) 13 (L)  Total Protein Latest Ref Range: 6.5-8.1 g/dL    4.4 (L) 4.9 (L)  Total Bilirubin Latest Ref Range: 0.3-1.2 mg/dL  0.4 0.4  Lactic Acid, Venous Latest Ref Range: 0.5-2.0 mmol/L     0.9  WBC Latest Ref Range: 4.0-10.5 K/uL   7.6 6.7 8.9  RBC Latest Ref Range: 3.87-5.11 MIL/uL   3.44 (L) 2.75 (L) 2.97 (L)  Hemoglobin Latest Ref Range: 12.0-15.0 g/dL   10.1 (L) 8.3 (L) 8.9 (L)  HCT Latest Ref Range: 36.0-46.0 %   30.8 (L) 26.1 (L) 27.1 (L)  MCV Latest Ref Range: 78.0-100.0 fL   89.5 94.9 91.2  MCH Latest Ref Range: 26.0-34.0 pg   29.4 30.2 30.0  MCHC Latest Ref Range: 30.0-36.0 g/dL   32.8 31.8 32.8  RDW Latest Ref Range: 11.5-15.5 %   15.8 (H) 15.6 (H) 14.7  Platelets Latest Ref  Range: 150-400 K/uL   230 235 243  Neutrophils Latest Ref Range: 43-77 %    61 68  Lymphocytes Latest Ref Range: 12-46 %    26 22  Monocytes Relative Latest Ref Range: 3-12 %    7 5  Eosinophil Latest Ref Range: 0-5 %    6 (H) 5  Basophil Latest Ref Range: 0-1 %    0 0  NEUT# Latest Ref Range: 1.7-7.7 K/uL    4.1 6.1  Lymphocyte # Latest Ref Range: 0.7-4.0 K/uL    1.7 2.0  Monocyte # Latest Ref Range: 0.1-1.0 K/uL    0.5 0.4  Eosinophils Absolute Latest Ref Range: 0.0-0.7 K/uL    0.4 0.4  Basophils Absolute Latest Ref Range: 0.0-0.1 K/uL    0.0 0.0  Prothrombin Time Latest Ref Range: 11.6-15.2 seconds  13.9 13.4    INR Latest Ref Range: 0.00-1.49   1.05 1.00    APTT Latest Ref Range: 24-37 seconds  30 26      Current medications; Norvasc 10 daily Lasix 20 daily K-Dur 20 milliequivalents daily Toviaz 6 mg daily Metopic ALT 25 twice a day Protonix 40 daily Catapres 0.23 times daily Neurontin 303 times a day Baclofen 20 mg 4 times a day Keppra 1500 twice a day  Review of systems Gen. the patient really denies anything is wrong with her. HEENT; she denies headache or visual changes Respiratory no cough no sputum GI no abdominal pain no diarrhea GU permanent suprapubic catheter. This was followed by a urologist at [facility]. I think this was due to a neurogenic bladder. In looking through her record on care everywhere I cannot see that she saw nephrologist  Physical exam Gen.; the patient was sitting in her bed eating lunch. She uses her right hand. Respiratory; clear entry bilaterally Cardiac heart sounds are normal no murmurs she appears to be euvolemic Abdomen mildly distended no masses no tenderness. Extremities there is some pitting edema of her left arm up to just below her elbow. It does not seem to be obvious evidence of a DVT. There is no lymphadenopathy in the left axilla. The edema does not go across her chest. There seems to be good perfusion of the left arm no arterial  issues here. Breasts no masses bilaterally Neurologic cranial nerves I don't see any abnormalities here. She is able to lift her left arm but it seems weaker than the right. My memory says that this was the pattern I saw on her admission although I seem to have confused left on right on my admission note Mental status. The patient is awake alert her speech is clear but she makes very bizarre statements such as asking me how I delt with the sleet today when it is  actually 90.   Impression/plan #1 altered mental status. She was felt to have a UTI in the ER. Her C and S is still pending. She is on Keflex which I will continue. Would wonder about a postictal phenomenon. He is also on high-dose baclofen and Toviaz who probably should stop or reduce these medications. #2 left arm edema; I think the weakness on the left arm is not new although otherwise Ms. spoke about this in my initial H&P on this woman and called the right arm. I think the edema is probably dependent in a patient with severe hypoalbuminemia. I'll have him elevate the arm. Her CT scan of the head did not show anything new I am not planning to MRI her unless something of all see her. #3 nephrotic range proteinuria with hematuria. She has a suprapubic catheter. I think she will need to see nephrology. I could not see that she saw nephrologist at [facility]. She was followed by urology. Don't know that I could do a 24-hour urine on this woman. She appears to leak urine presumably from her urethra. #4 profound hypoalbuminemia I think a combination of nephrotic syndrome and very poor nutritional intake. In fact since we initiated artificial feeding on this woman her albumen is up to 1.4 brackets was down as low as 1.1]  I'm going to send her to nephrology. She is not a diabetic. I wonder if she hasn't GEN sick renal disease as a cause of this. I'm going to continue the Keflex pending the urine culture from the emergency room. She has a history of  seizures but no seizure was observed this time #5 mild chronic renal failure. Presumably hypertension she had an ultrasound that showed an atrophic right kidney in April. 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 tHis needs debridement. The area on her right heel looks very stable. I don't think the area on the left ischial tuberosityis infected however

## 2014-11-21 NOTE — Progress Notes (Addendum)
Patient ID: [name], female   DOB: [birth year], 49 y.o.   MRN: [id number]                PROGRESS NOTE  DATE:  11/16/2014       FACILITY: [facility]                   LEVEL OF CARE:   SNF   Acute Visit                CHIEF COMPLAINT:  Follow up proteinuria.    HISTORY OF PRESENT ILLNESS:  This is a patient whom I admitted to the building in late May.  She had been at [facility] from 10/09/2014 through 10/23/2014.  She had pneumonia and a UTI.    On her arrival here, she was eating virtually nothing.  We also noted an albumin of 1.4 in the hospital.  Repeated this here at 1.1.  The family reported she had not been eating well for many months.  Looking at these two facts, I actually ordered a PEG tube to support her nutrition.    As it turns out, looking back through things, she has proteinuria.  A urinalysis that I did on 10/25/2014 showed 4+ proteinuria, 2+ occult blood.  Microscopic urinalysis showed hyline casts.  I went on to quantitate the protein, doing a protein:creatinine ratio.  I have done this on two occasions.  The first one was over 8000, the second one over 25,000.  Repeat urinalysis shows the 4+ protein, 2+ occult blood.    In talking to the patient and her family, she was apparently looked at in [place] a few years ago.  She had a seizure.  By their description, may have had hydronephrosis and saw a urologist, although they think she also had a "kidney doctor" and have given me the name of Dr. Arnetha Courser.    PHYSICAL EXAMINATION:   CHEST/RESPIRATORY:  Clear air entry bilaterally.    CARDIOVASCULAR:   CARDIAC:  Heart sounds are normal.  No signs of heart failure.   EDEMA/VARICOSITIES:  She has lower extremity edema, which I have assumed is due to hypoalbuminemia.     ASSESSMENT/PLAN:               Nephrotic range proteinuria.  She does not overtly have any extra-renal diseases that should be causing this.   I wonder whether this is an intrinsic renal  problem.  I will try to research this in Care Everywhere, specifically as it applies to Dr. Claiborne Billings.  I would like to refer her and I was set to try and refer her to nephrology.   if she has seen one in [place], I will try to research that. She also has continuous hematuria. I don't believe we will be able to order a 24 urine as she leeks urine presumably through her urethra unless we catheterize her here as well         ADDENDUM:  A repeat comprehensive metabolic panel is ordered next week.  In looking through [place] I don't see that the patient has seen Nephrology, nor to I see reference to her 4+ proteinuria or her hematuria

## 2014-11-22 LAB — URINE CULTURE

## 2014-11-23 ENCOUNTER — Telehealth (HOSPITAL_COMMUNITY): Payer: Self-pay

## 2014-11-23 ENCOUNTER — Non-Acute Institutional Stay (SKILLED_NURSING_FACILITY): Payer: Medicaid Other | Admitting: Internal Medicine

## 2014-11-23 DIAGNOSIS — L89310 Pressure ulcer of right buttock, unstageable: Secondary | ICD-10-CM | POA: Diagnosis not present

## 2014-11-23 DIAGNOSIS — R809 Proteinuria, unspecified: Secondary | ICD-10-CM

## 2014-11-23 DIAGNOSIS — E43 Unspecified severe protein-calorie malnutrition: Secondary | ICD-10-CM | POA: Diagnosis not present

## 2014-11-23 NOTE — Telephone Encounter (Signed)
Post ED Visit - Positive Culture Follow-up  Culture report reviewed by antimicrobial stewardship pharmacist: []  Wes Garvin, Pharm.D., BCPS [x]  Heide Guile, Pharm.D., BCPS []  Alycia Rossetti, Pharm.D., BCPS []  [place], [place].D., BCPS, AAHIVP []  Legrand Como, Pharm.D., BCPS, AAHIVP []  Isac Sarna, Pharm.D., BCPS  Positive urine culture Treated with cephalexin, organism sensitive to the same and no further patient follow-up is required at this time.  [name] 11/23/2014, 5:21 PM

## 2014-11-29 NOTE — Progress Notes (Addendum)
Patient ID: [name], female   DOB: 09-23-1965, 49 y.o.   MRN: [id number]                PROGRESS NOTE  DATE:  11/23/2014            FACILITY: [facility]                    LEVEL OF CARE:   SNF   Acute Visit                      CHIEF COMPLAINT:  Follow up medical issues including suprapubic catheter, wound over her right ischial tuberosity.    HISTORY OF PRESENT ILLNESS:  [name] continues to have a difficult set of circumstances.    We have continued tube feeding at night.  Her intake during the day seems to be better.  I note that her albumin is up to 1.8.    In addition, she seems to not have a lot of drainage coming out of her suprapubic catheter.  However, it is coming out of her normal urethra.  The suprapubic catheter is longstanding and is followed by a Dr. Claiborne Billings of Urology at [place]:   SKIN:   INSPECTION:  The area over her right ischial tuberosity is unstageable.   PROCEDURE:  This was surgically debrided of necrotic subcutaneous tissue.  We will still need to continue Santyl to this area.       ASSESSMENT/PLAN:                      Unstageable wound over the right ischial tuberosity.  This was debrided today surgically.  We will need to continue Santyl with foam dressings to this area.  She will need a wound care center if she is discharged   Suprapubic catheter issues.  I had them change the catheter.  She appears to be voiding through the normal urethral orifice. Apparently this was known to her urologist before she became ill.   Hypoalbuminemia: Combination of severe protein calorie malnutrition (eating nothing on her arrival here) and protein losing nephropathy I would continue her peg tube supplements running at night for at least 2 months. Currently eating 50-75% of meals.

## 2014-11-30 ENCOUNTER — Non-Acute Institutional Stay (SKILLED_NURSING_FACILITY): Payer: Medicaid Other | Admitting: Internal Medicine

## 2014-11-30 DIAGNOSIS — N390 Urinary tract infection, site not specified: Secondary | ICD-10-CM | POA: Diagnosis not present

## 2014-11-30 DIAGNOSIS — E43 Unspecified severe protein-calorie malnutrition: Secondary | ICD-10-CM | POA: Diagnosis not present

## 2014-11-30 DIAGNOSIS — T83511A Infection and inflammatory reaction due to indwelling urethral catheter, initial encounter: Secondary | ICD-10-CM

## 2014-11-30 DIAGNOSIS — N049 Nephrotic syndrome with unspecified morphologic changes: Secondary | ICD-10-CM | POA: Diagnosis not present

## 2014-11-30 DIAGNOSIS — T8351XA Infection and inflammatory reaction due to indwelling urinary catheter, initial encounter: Secondary | ICD-10-CM

## 2014-12-05 NOTE — Progress Notes (Addendum)
Patient ID: Beryle Badal, female   DOB: [birth year], 49 y.o.   MRN: [id number]                PROGRESS NOTE  DATE:  11/30/2014           FACILITY: [facility]                        LEVEL OF CARE:   SNF   Acute Visit/Discharge Visit   CHIEF COMPLAINT:  Pre-discharge review.      HISTORY OF PRESENT ILLNESS:  This is a patient whom I admitted to the building in May.  The patient had come from [facility], presenting with acute delirium.  On admission, she was septic and hypotensive.  She was felt to have a UTI and pneumonia.    MRI of the brain showed no evidence of an acute infarction.  She had a chronic infarct noted in the high left parietal lobe, which was felt to be chronic.    When the patient arrived here, there were several ongoing concerns:    She was eating virtually nothing and this went on for days on end, although she would drink and including some supplement.  At the beginning of June, after discussing this with her son, it was apparent that she had not been eating well for many months.  We had a PEG tube placed with the assistance of Interventional Radiology.  Her albumin at the time was 1.1.    After placing, or just around the same time as placing the PEG tube, I also noted that she had been spilling protein on multiple urinalyses dating back over the last two years or so.  I went on to semi-quantitate this by doing a urine protein:creatinine ratio.  I did this on two different occasions, the first being 8,565 mg/g and a second one was over 25,000.  She also has continuous blood in her urine, also dating back a long period of time.  However, she has a suprapubic catheter.  We made an appointment with Dr. Lowanda Foster to look at the reason for the proteinuria and hematuria.    She also follows with a urologist at [facility].  She has a suprapubic catheter placed.  She also drains urine out of her urethra, making it impossible to do a 24-hour urine collection  for protein.  She is not a diabetic.    Acute delirium.   The patient was over in [facility] ER on 11/18/2014.  She had a urine culture that showed Proteus, treated with Keflex, and she made an improvement.  A CT scan of the head was again repeated showing chronic encephalomalacia in the high left parietal lobe.    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  This requires a wound care center's expertise and I have talked to the son about this.  She also has a small wound on the right heel.       LABORATORY DATA:   Last lab work showed an albumin of 1.8.  This was increased.    CURRENT MEDICATIONS:  Discharge medications include:      Norvasc 10 q.d.      Lasix 20 q.d.      K-Dur 40 mEq daily.    Zinc sulfate 220 q.d.      Pro-Stat 30 mL three times a day.    Keppra 1500 b.i.d.      Lopressor 25 b.i.d.  Protonix 40 q.d.       Vitamin C 500 b.i.d.      Catapres 0.2, 1 tablet three times a day.    Neurontin 300 three times a day.    Baclofen 10 mg four times daily.    Isosource 1.5, 50 cc/hr from 8p to 6a.     Free water flushes 200 three times a day.      PHYSICAL EXAMINATION:   CHEST/RESPIRATORY:  Clear air entry bilaterally.    CARDIOVASCULAR:   CARDIAC:  Heart sounds are normal.  There are no murmurs.    GASTROINTESTINAL:   ABDOMEN:  Soft.  No tenderness.  No masses.    INSPECTION:  She has a deep stage III, perhaps a stage IV wound over her left ischial tuberosity, a more superficial wound on her right heel.   CIRCULATION:   EDEMA/VARICOSITIES:  Extremities:  Much less edema.       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  Also has a history of a stroke.   Nephrotic-range proteinuria.  The reason for this is not clear.  She does not have diabetes.  I have monitored her blood sugars here.  I have also done a hemoglobin A1c.  She does not have an obvious systemic disease causing this.  I  have asked for Dr. Lowanda Foster to look at this.    Chronic suprapubic catheter.  She also drains urine out of the urethra.  I have made an appointment for her to follow up at Urology at [place] III wound, at least, over the left ischial tuberosity.  This will require Santyl, moist gauze, foam dressing.  I have talked to her and her family in detail about pressure relief, only up in the wheelchair for short periods of time during the day.  I think this is going to require further debridement at a wound care center.    Severe protein calorie malnutrition.  Even though she has nephrotic-range proteinuria, she was eating virtually nothing when she came into this facility.  We were able to place a PEG tube.  We are feeding her just at night, 50 cc of Isosource per hour from 8p to 6a.  This has helped raise her albumin from 1.1 to 1.8.  She has left peripheral edema.    Wound dressing:  Santyl, moist gauze, foam dressing.  Change three times a week and p.r.n.   Foam to the right heel.   Finally it was brought to my attention that the insurance company will not pay for a pump, therefore we were left with no option but to bolus feed her 2x per day in addition to what she will take in orally. Her oral intake runs 50-75%              ADDENDUM:  She has all DME including a hospital bed, Hoyer lift, and a wheelchair with cushion I have been told.  She will go home with [place] skilled nursing, PT, OT.    As I understand things, the tube feeding will be changed to Osmolite.

## 2014-12-14 ENCOUNTER — Ambulatory Visit: Payer: Medicaid Other | Admitting: Neurology

## 2014-12-23 ENCOUNTER — Emergency Department (HOSPITAL_COMMUNITY)
Admission: EM | Admit: 2014-12-23 | Discharge: 2014-12-23 | Disposition: A | Discharge status: Home / Self Care | Payer: Medicaid Other | Attending: Emergency Medicine | Admitting: Emergency Medicine

## 2014-12-23 ENCOUNTER — Encounter (HOSPITAL_COMMUNITY): Payer: Self-pay | Admitting: Emergency Medicine

## 2014-12-23 ENCOUNTER — Emergency Department (HOSPITAL_COMMUNITY): Payer: Medicaid Other

## 2014-12-23 DIAGNOSIS — G40909 Epilepsy, unspecified, not intractable, without status epilepticus: Secondary | ICD-10-CM | POA: Diagnosis not present

## 2014-12-23 DIAGNOSIS — Z8542 Personal history of malignant neoplasm of other parts of uterus: Secondary | ICD-10-CM | POA: Diagnosis not present

## 2014-12-23 DIAGNOSIS — I10 Essential (primary) hypertension: Secondary | ICD-10-CM | POA: Insufficient documentation

## 2014-12-23 DIAGNOSIS — R059 Cough, unspecified: Secondary | ICD-10-CM

## 2014-12-23 DIAGNOSIS — Z79899 Other long term (current) drug therapy: Secondary | ICD-10-CM | POA: Insufficient documentation

## 2014-12-23 DIAGNOSIS — Z792 Long term (current) use of antibiotics: Secondary | ICD-10-CM | POA: Diagnosis not present

## 2014-12-23 DIAGNOSIS — Z8742 Personal history of other diseases of the female genital tract: Secondary | ICD-10-CM | POA: Diagnosis not present

## 2014-12-23 DIAGNOSIS — Z8669 Personal history of other diseases of the nervous system and sense organs: Secondary | ICD-10-CM | POA: Insufficient documentation

## 2014-12-23 DIAGNOSIS — R05 Cough: Secondary | ICD-10-CM | POA: Diagnosis not present

## 2014-12-23 DIAGNOSIS — R0602 Shortness of breath: Secondary | ICD-10-CM | POA: Diagnosis present

## 2014-12-23 LAB — CBC WITH DIFFERENTIAL/PLATELET
Basophils Absolute: 0 10*3/uL (ref 0.0–0.1)
Basophils Relative: 0 % (ref 0–1)
EOS PCT: 3 % (ref 0–5)
Eosinophils Absolute: 0.3 10*3/uL (ref 0.0–0.7)
HCT: 24.5 % — ABNORMAL LOW (ref 36.0–46.0)
Hemoglobin: 8.1 g/dL — ABNORMAL LOW (ref 12.0–15.0)
LYMPHS ABS: 1.2 10*3/uL (ref 0.7–4.0)
Lymphocytes Relative: 12 % (ref 12–46)
MCH: 29.3 pg (ref 26.0–34.0)
MCHC: 33.1 g/dL (ref 30.0–36.0)
MCV: 88.8 fL (ref 78.0–100.0)
Monocytes Absolute: 0.4 10*3/uL (ref 0.1–1.0)
Monocytes Relative: 3 % (ref 3–12)
Neutro Abs: 8.8 10*3/uL — ABNORMAL HIGH (ref 1.7–7.7)
Neutrophils Relative %: 82 % — ABNORMAL HIGH (ref 43–77)
PLATELETS: 361 10*3/uL (ref 150–400)
RBC: 2.76 MIL/uL — ABNORMAL LOW (ref 3.87–5.11)
RDW: 14.9 % (ref 11.5–15.5)
WBC: 10.8 10*3/uL — ABNORMAL HIGH (ref 4.0–10.5)

## 2014-12-23 LAB — COMPREHENSIVE METABOLIC PANEL
ALBUMIN: 1.6 g/dL — AB (ref 3.5–5.0)
ALT: 13 U/L — ABNORMAL LOW (ref 14–54)
AST: 17 U/L (ref 15–41)
Alkaline Phosphatase: 113 U/L (ref 38–126)
Anion gap: 7 (ref 5–15)
BUN: 20 mg/dL (ref 6–20)
CALCIUM: 7.5 mg/dL — AB (ref 8.9–10.3)
CO2: 19 mmol/L — ABNORMAL LOW (ref 22–32)
CREATININE: 1.5 mg/dL — AB (ref 0.44–1.00)
Chloride: 105 mmol/L (ref 101–111)
GFR calc non Af Amer: 41 mL/min — ABNORMAL LOW (ref >60)
GFR, EST AFRICAN AMERICAN: 47 mL/min — AB (ref >60)
GLUCOSE: 161 mg/dL — AB (ref 65–99)
Potassium: 3.7 mmol/L (ref 3.5–5.1)
Sodium: 131 mmol/L — ABNORMAL LOW (ref 135–145)
TOTAL PROTEIN: 5.6 g/dL — AB (ref 6.5–8.1)
Total Bilirubin: 0.2 mg/dL — ABNORMAL LOW (ref 0.3–1.2)

## 2014-12-23 NOTE — ED Notes (Signed)
Dr. Stark Jock aware of BP.

## 2014-12-23 NOTE — ED Notes (Addendum)
Per EMS pt. Family reports that pt. Became short of breath today. Pt. With suprapubic catheter in place. Pt. Alert and talking at triage.

## 2014-12-23 NOTE — ED Notes (Signed)
Daughter came from room stating she needed to go home but wanted to leave her number if needed and when patient is discharged home. [facility] trans[facility] patient home. Family member's name is Joelene Millin, number is [phone].

## 2014-12-23 NOTE — ED Notes (Signed)
Called [facility] for transport back home.
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
## 2014-12-23 NOTE — Discharge Instructions (Signed)
Return to the emergency department for chest pain or difficulty breathing.   Cough, Adult  A cough is a reflex that helps clear your throat and airways. It can help heal the body or may be a reaction to an irritated airway. A cough may only last 2 or 3 weeks (acute) or may last more than 8 weeks (chronic).  CAUSES Acute cough:  Viral or bacterial infections. Chronic cough:  Infections.  Allergies.  Asthma.  Post-nasal drip.  Smoking.  Heartburn or acid reflux.  Some medicines.  Chronic lung problems (COPD).  Cancer. SYMPTOMS   Cough.  Fever.  Chest pain.  Increased breathing rate.  High-pitched whistling sound when breathing (wheezing).  Colored mucus that you cough up (sputum). TREATMENT   A bacterial cough may be treated with antibiotic medicine.  A viral cough must run its course and will not respond to antibiotics.  Your caregiver may recommend other treatments if you have a chronic cough. HOME CARE INSTRUCTIONS   Only take over-the-counter or prescription medicines for pain, discomfort, or fever as directed by your caregiver. Use cough suppressants only as directed by your caregiver.  Use a cold steam vaporizer or humidifier in your bedroom or home to help loosen secretions.  Sleep in a semi-upright position if your cough is worse at night.  Rest as needed.  Stop smoking if you smoke. SEEK IMMEDIATE MEDICAL CARE IF:   You have pus in your sputum.  Your cough starts to worsen.  You cannot control your cough with suppressants and are losing sleep.  You begin coughing up blood.  You have difficulty breathing.  You develop pain which is getting worse or is uncontrolled with medicine.  You have a fever. MAKE SURE YOU:   Understand these instructions.  Will watch your condition.  Will get help right away if you are not doing well or get worse. Document Released: 11/09/2010 Document Revised: 08/05/2011 Document Reviewed:  11/09/2010 [facility] Patient Information 2015 [place], [place]. This information is not intended to replace advice given to you by your health care provider. Make sure you discuss any questions you have with your health care provider.

## 2015-02-06 ENCOUNTER — Other Ambulatory Visit (HOSPITAL_COMMUNITY)
Admission: RE | Admit: 2015-02-06 | Discharge: 2015-02-06 | Disposition: A | Discharge status: Home / Self Care | Payer: Medicaid Other | Source: Other Acute Inpatient Hospital | Attending: Internal Medicine | Admitting: Internal Medicine

## 2015-02-06 DIAGNOSIS — N390 Urinary tract infection, site not specified: Secondary | ICD-10-CM | POA: Insufficient documentation

## 2015-02-06 LAB — URINALYSIS, ROUTINE W REFLEX MICROSCOPIC
Bilirubin Urine: NEGATIVE
Glucose, UA: 250 mg/dL — AB
KETONES UR: NEGATIVE mg/dL
NITRITE: NEGATIVE
PROTEIN: 100 mg/dL — AB
SPECIFIC GRAVITY, URINE: 1.01 (ref 1.005–1.030)
UROBILINOGEN UA: 0.2 mg/dL (ref 0.0–1.0)
pH: >9 — ABNORMAL HIGH (ref 5.0–8.0)

## 2015-02-06 LAB — URINE MICROSCOPIC-ADD ON

## 2015-02-08 LAB — URINE CULTURE

## 2015-03-31 ENCOUNTER — Other Ambulatory Visit (HOSPITAL_COMMUNITY)
Admission: RE | Admit: 2015-03-31 | Discharge: 2015-03-31 | Disposition: A | Discharge status: Home / Self Care | Payer: Medicaid Other | Source: Other Acute Inpatient Hospital | Attending: Internal Medicine | Admitting: Internal Medicine

## 2015-03-31 DIAGNOSIS — N390 Urinary tract infection, site not specified: Secondary | ICD-10-CM | POA: Diagnosis not present

## 2015-03-31 LAB — URINALYSIS, ROUTINE W REFLEX MICROSCOPIC
BILIRUBIN URINE: NEGATIVE
GLUCOSE, UA: 100 mg/dL — AB
Ketones, ur: NEGATIVE mg/dL
Nitrite: NEGATIVE
Protein, ur: 100 mg/dL — AB
SPECIFIC GRAVITY, URINE: 1.015 (ref 1.005–1.030)
Urobilinogen, UA: 0.2 mg/dL (ref 0.0–1.0)
pH: >9 — ABNORMAL HIGH (ref 5.0–8.0)

## 2015-03-31 LAB — URINE MICROSCOPIC-ADD ON

## 2015-04-01 LAB — URINE CULTURE

## 2015-04-12 ENCOUNTER — Encounter (HOSPITAL_COMMUNITY): Payer: Self-pay | Admitting: *Deleted

## 2015-04-12 ENCOUNTER — Emergency Department (HOSPITAL_COMMUNITY)
Admission: EM | Admit: 2015-04-12 | Discharge: 2015-04-12 | Disposition: A | Discharge status: Home / Self Care | Payer: Medicaid Other | Attending: Emergency Medicine | Admitting: Emergency Medicine

## 2015-04-12 ENCOUNTER — Emergency Department (HOSPITAL_COMMUNITY): Payer: Medicaid Other

## 2015-04-12 DIAGNOSIS — R1010 Upper abdominal pain, unspecified: Secondary | ICD-10-CM | POA: Insufficient documentation

## 2015-04-12 DIAGNOSIS — Z8742 Personal history of other diseases of the female genital tract: Secondary | ICD-10-CM | POA: Diagnosis not present

## 2015-04-12 DIAGNOSIS — Z931 Gastrostomy status: Secondary | ICD-10-CM | POA: Insufficient documentation

## 2015-04-12 DIAGNOSIS — R079 Chest pain, unspecified: Secondary | ICD-10-CM | POA: Insufficient documentation

## 2015-04-12 DIAGNOSIS — D649 Anemia, unspecified: Secondary | ICD-10-CM | POA: Diagnosis not present

## 2015-04-12 DIAGNOSIS — Z79899 Other long term (current) drug therapy: Secondary | ICD-10-CM | POA: Insufficient documentation

## 2015-04-12 DIAGNOSIS — G40909 Epilepsy, unspecified, not intractable, without status epilepticus: Secondary | ICD-10-CM | POA: Diagnosis not present

## 2015-04-12 DIAGNOSIS — Z8542 Personal history of malignant neoplasm of other parts of uterus: Secondary | ICD-10-CM | POA: Diagnosis not present

## 2015-04-12 LAB — COMPREHENSIVE METABOLIC PANEL
ALK PHOS: 97 U/L (ref 38–126)
ALT: 15 U/L (ref 14–54)
ANION GAP: 3 — AB (ref 5–15)
AST: 20 U/L (ref 15–41)
Albumin: 1.2 g/dL — ABNORMAL LOW (ref 3.5–5.0)
BILIRUBIN TOTAL: 0.3 mg/dL (ref 0.3–1.2)
BUN: 15 mg/dL (ref 6–20)
CO2: 27 mmol/L (ref 22–32)
Calcium: 7.5 mg/dL — ABNORMAL LOW (ref 8.9–10.3)
Chloride: 108 mmol/L (ref 101–111)
Creatinine, Ser: 0.71 mg/dL (ref 0.44–1.00)
GFR calc Af Amer: >60 mL/min (ref >60)
GFR calc non Af Amer: >60 mL/min (ref >60)
Glucose, Bld: 104 mg/dL — ABNORMAL HIGH (ref 65–99)
POTASSIUM: 4.4 mmol/L (ref 3.5–5.1)
Sodium: 138 mmol/L (ref 135–145)
TOTAL PROTEIN: 4.9 g/dL — AB (ref 6.5–8.1)

## 2015-04-12 LAB — CBC
HEMATOCRIT: 23.6 % — AB (ref 36.0–46.0)
Hemoglobin: 7.4 g/dL — ABNORMAL LOW (ref 12.0–15.0)
MCH: 28 pg (ref 26.0–34.0)
MCHC: 31.4 g/dL (ref 30.0–36.0)
MCV: 89.4 fL (ref 78.0–100.0)
Platelets: 322 10*3/uL (ref 150–400)
RBC: 2.64 MIL/uL — ABNORMAL LOW (ref 3.87–5.11)
RDW: 16.2 % — ABNORMAL HIGH (ref 11.5–15.5)
WBC: 6.9 10*3/uL (ref 4.0–10.5)

## 2015-04-12 LAB — LIPASE, BLOOD: Lipase: 35 U/L (ref 11–51)

## 2015-04-12 MED ORDER — PANTOPRAZOLE SODIUM 40 MG PO TBEC: 40.0000 mg | DELAYED_RELEASE_TABLET | Freq: Two times a day (BID) | ORAL | Status: DC

## 2015-04-12 MED ORDER — OXYCODONE-ACETAMINOPHEN 5-325 MG PO TABS
1.0000 | ORAL_TABLET | Freq: Once | ORAL | Status: AC
  Administered 2015-04-12: 1 via ORAL
  Filled 2015-04-12: qty 1

## 2015-04-12 NOTE — Discharge Instructions (Signed)

## 2015-04-12 NOTE — ED Notes (Signed)
Lab at bedside

## 2015-04-12 NOTE — ED Provider Notes (Signed)
CSN: [id number]     Arrival date & time 04/12/15  1300 History   First MD Initiated Contact with Patient 04/12/15 1316     Chief Complaint  Patient presents with  . Rib pain       HPI Patient is paraplegic who presents to the emergency room from her doctor's office with complaints of bilateral lower chest and upper abdominal pain.  She denies nausea vomiting or diarrhea.  She does have a gastric tube in place.  She denies dark stool.  Shortness of breath.  Denies productive cough.  Denies fever chills.  States she was at her doctor's office today for the complaint and he stated that she would likely need imaging and thus she was in the emergency department without evaluation or testing in the office today.  No other complaints.   Past Medical History  Diagnosis Date  . Paraplegia (lower)   . Seizure disorder ([place])   . High blood pressure   . Suprapubic catheter ([place])   . Cancer ([place])     uterine  . Seizures ([place])   . Abnormal uterine bleeding (AUB) 06/15/2014   Past Surgical History  Procedure Laterality Date  . Back surgery      Pt stated "before 2000"   Family History  Problem Relation Age of Onset  . Cancer Mother   . Hypertension Mother   . Cancer Sister     breast and then spread everywhere.  . Diabetes Paternal Grandmother   . Hypertension Paternal Grandmother    Social History  Substance Use Topics  . Smoking status: Never Smoker   . Smokeless tobacco: Never Used  . Alcohol Use: 0.0 oz/week    0 Standard drinks or equivalent per week     Comment: every once in a while   OB History    Gravida Para Term Preterm AB TAB SAB Ectopic Multiple Living   4 3   1  1   3      Review of Systems  All other systems reviewed and are negative.     Allergies  Quinine derivatives  Home Medications   Prior to Admission medications   Medication Sig Start Date End Date Taking? Authorizing Provider  amLODipine (NORVASC) 10 MG tablet Take 1 tablet (10 mg total) by mouth  daily. 05/10/14  Yes Donne Hazel, MD  baclofen (LIORESAL) 20 MG tablet Take 1 tablet (20 mg total) by mouth 4 (four) times daily. 06/14/14  Yes Melvenia Beam, MD  bisacodyl (DULCOLAX) 10 MG suppository Place 10 mg rectally daily.   Yes Historical Provider, MD  cloNIDine (CATAPRES) 0.2 MG tablet Take 1 tablet (0.2 mg total) by mouth 3 (three) times daily. 05/10/14  Yes Donne Hazel, MD  docusate sodium (COLACE) 100 MG capsule Take 100 mg by mouth at bedtime.   Yes Historical Provider, MD  furosemide (LASIX) 20 MG tablet Take 20 mg by mouth daily.   Yes Historical Provider, MD  gabapentin (NEURONTIN) 300 MG capsule Take 300 mg by mouth 3 (three) times daily.    Yes Historical Provider, MD  levETIRAcetam (KEPPRA) 500 MG tablet Take 3 tablets (1,500 mg total) by mouth 2 (two) times daily. 10/19/14  Yes [name], MD  metoprolol tartrate (LOPRESSOR) 25 MG tablet Take 1 tablet (25 mg total) by mouth 2 (two) times daily. 05/10/14  Yes Donne Hazel, MD  Multiple Vitamins-Minerals (MULTIVITAMIN ADULT PO) Take 1 tablet by mouth daily.    Yes Historical Provider, MD  potassium chloride SA (K-DUR,KLOR-CON) 20 MEQ tablet Take 40 mEq by mouth daily.    Yes Historical Provider, MD  amoxicillin-clavulanate (AUGMENTIN) 500-125 MG per tablet Take 1 tablet (500 mg total) by mouth 3 (three) times daily. Patient not taking: Reported on 11/03/2014 10/19/14   [name], MD  cephALEXin (KEFLEX) 500 MG capsule Take 1 capsule (500 mg total) by mouth 3 (three) times daily. Patient not taking: Reported on 04/12/2015 11/18/14   Jola Schmidt, MD  fesoterodine (TOVIAZ) 8 MG TB24 tablet Take 8 mg by mouth daily.    Historical Provider, MD  pantoprazole (PROTONIX) 40 MG tablet Take 1 tablet (40 mg total) by mouth 2 (two) times daily. 04/12/15   Jola Schmidt, MD   BP 113/76 mmHg  Pulse 67  Temp(Src) 97.8 F (36.6 C) (Oral)  Resp 15  SpO2 100%  LMP 04/10/2015 Physical Exam  Constitutional: She is oriented to  person, place, and time. She appears well-developed and well-nourished.  HENT:  Head: Normocephalic.  Eyes: EOM are normal.  Neck: Normal range of motion.  Pulmonary/Chest: Effort normal.  Mild tenderness of her bilateral anterior inferior portion of her rib cage.  No obvious skin lesions.  No deformities noted.  No bruising noted  Abdominal: She exhibits no distension.  Gastric tube in place without surrounding signs of infection.  No significant abdominal tenderness in her upper abdomen.  Musculoskeletal: Normal range of motion.  Neurological: She is alert and oriented to person, place, and time.  Psychiatric: She has a normal mood and affect.  Nursing note and vitals reviewed.   ED Course  Procedures (including critical care time) Labs Review Labs Reviewed  CBC - Abnormal; Notable for the following:    RBC 2.64 (*)    Hemoglobin 7.4 (*)    HCT 23.6 (*)    RDW 16.2 (*)    All other components within normal limits  COMPREHENSIVE METABOLIC PANEL - Abnormal; Notable for the following:    Glucose, Bld 104 (*)    Calcium 7.5 (*)    Total Protein 4.9 (*)    Albumin 1.2 (*)    Anion gap 3 (*)    All other components within normal limits  LIPASE, BLOOD    Imaging Review Dg Abd Acute W/chest  04/12/2015  CLINICAL DATA:  Pain on both sides of abdomen. EXAM: DG ABDOMEN ACUTE W/ 1V CHEST COMPARISON:  Chest radiograph 12/23/2014 FINDINGS: Posterior thoracic lumbar fusion noted. Normal cardiac silhouette. Mild LEFT basilar atelectasis. Upper lungs are clear. Percutaneous gastrostomy tube noted. No dilated loops of large or small bowel. Moderate volume stool in the RIGHT colon and rectosigmoid rectum. IMPRESSION: 1. Mild LEFT basilar atelectasis. 2. No bowel obstruction. 3. Moderate volume stool in the rectosigmoid colon and RIGHT colon. Electronically Signed   By: Suzy Bouchard M.D.   On: 04/12/2015 15:55   I have personally reviewed and evaluated these images and lab results as part  of my medical decision-making.   EKG Interpretation   Date/Time:  Wednesday April 12 2015 13:01:34 EST Ventricular Rate:  69 PR Interval:  140 QRS Duration: 115 QT Interval:  430 QTC Calculation: 461 R Axis:   23 Text Interpretation:  Sinus rhythm Incomplete right bundle branch block  Low voltage, precordial leads No significant change was found Confirmed by  Rea Reser  MD, Lennette Bihari ([id number]) on 04/12/2015 1:04:16 PM      MDM   Final diagnoses:  Upper abdominal pain  Anemia, unspecified anemia type    No significant  abnormalities noted.  Examination is rather benign.  Overall well-appearing.  Vital signs normal.  Discharge home in good condition.  Unclear etiology of her pain.  May be muscular in nature.  No indication for advanced imaging at this time.  Ongoing observation at the nursing facility.    Jola Schmidt, MD 04/14/15 [phone]

## 2015-04-12 NOTE — ED Notes (Signed)
Pt comes in via EMS from her Dr.'s office for bilateral pain on her sides. Pt denies any cough.

## 2015-04-18 ENCOUNTER — Encounter (INDEPENDENT_AMBULATORY_CARE_PROVIDER_SITE_OTHER): Payer: Self-pay | Admitting: Internal Medicine

## 2015-04-18 ENCOUNTER — Ambulatory Visit (INDEPENDENT_AMBULATORY_CARE_PROVIDER_SITE_OTHER): Payer: Medicaid Other | Admitting: Internal Medicine

## 2015-04-18 DIAGNOSIS — R1013 Epigastric pain: Secondary | ICD-10-CM | POA: Diagnosis not present

## 2015-04-18 DIAGNOSIS — D509 Iron deficiency anemia, unspecified: Secondary | ICD-10-CM | POA: Diagnosis not present

## 2015-04-18 DIAGNOSIS — R140 Abdominal distension (gaseous): Secondary | ICD-10-CM

## 2015-04-18 NOTE — Patient Instructions (Signed)
CT abdomen/pelvis with CM.  Labs today.
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
## 2015-04-19 LAB — CBC WITH DIFFERENTIAL/PLATELET
Basophils Absolute: 0.1 10*3/uL (ref 0.0–0.1)
Basophils Relative: 1 % (ref 0–1)
EOS ABS: 0.5 10*3/uL (ref 0.0–0.7)
Eosinophils Relative: 7 % — ABNORMAL HIGH (ref 0–5)
HCT: 27.2 % — ABNORMAL LOW (ref 36.0–46.0)
Hemoglobin: 8.4 g/dL — ABNORMAL LOW (ref 12.0–15.0)
Lymphocytes Relative: 26 % (ref 12–46)
Lymphs Abs: 1.8 10*3/uL (ref 0.7–4.0)
MCH: 27.5 pg (ref 26.0–34.0)
MCHC: 30.9 g/dL (ref 30.0–36.0)
MCV: 88.9 fL (ref 78.0–100.0)
MONOS PCT: 6 % (ref 3–12)
MPV: 7.9 fL — ABNORMAL LOW (ref 8.6–12.4)
Monocytes Absolute: 0.4 10*3/uL (ref 0.1–1.0)
NEUTROS PCT: 60 % (ref 43–77)
Neutro Abs: 4.3 10*3/uL (ref 1.7–7.7)
PLATELETS: 519 10*3/uL — AB (ref 150–400)
RBC: 3.06 MIL/uL — AB (ref 3.87–5.11)
RDW: 16.2 % — ABNORMAL HIGH (ref 11.5–15.5)
WBC: 7.1 10*3/uL (ref 4.0–10.5)

## 2015-04-19 LAB — COMPREHENSIVE METABOLIC PANEL
ALT: 13 U/L (ref 6–29)
AST: 19 U/L (ref 10–35)
Albumin: 1.5 g/dL — ABNORMAL LOW (ref 3.6–5.1)
Alkaline Phosphatase: 117 U/L — ABNORMAL HIGH (ref 33–115)
BUN: 13 mg/dL (ref 7–25)
CHLORIDE: 103 mmol/L (ref 98–110)
CO2: 27 mmol/L (ref 20–31)
Calcium: 7.4 mg/dL — ABNORMAL LOW (ref 8.6–10.2)
Creat: 0.76 mg/dL (ref 0.50–1.10)
Glucose, Bld: 91 mg/dL (ref 65–99)
Potassium: 3.8 mmol/L (ref 3.5–5.3)
Sodium: 137 mmol/L (ref 135–146)
Total Bilirubin: 0.1 mg/dL — ABNORMAL LOW (ref 0.2–1.2)
Total Protein: 5 g/dL — ABNORMAL LOW (ref 6.1–8.1)

## 2015-04-19 LAB — IRON AND TIBC
%SAT: 17 % (ref 11–50)
Iron: 24 ug/dL — ABNORMAL LOW (ref 40–190)
TIBC: 144 ug/dL — ABNORMAL LOW (ref 250–450)
UIBC: 120 ug/dL — ABNORMAL LOW (ref 125–400)

## 2015-04-19 LAB — FERRITIN: Ferritin: 77 ng/mL (ref 10–291)

## 2015-04-24 ENCOUNTER — Ambulatory Visit (HOSPITAL_COMMUNITY): Payer: Medicaid Other

## 2015-04-28 ENCOUNTER — Ambulatory Visit (HOSPITAL_COMMUNITY)
Admission: RE | Admit: 2015-04-28 | Discharge: 2015-04-28 | Disposition: A | Discharge status: Home / Self Care | Payer: Medicaid Other | Source: Ambulatory Visit | Attending: Internal Medicine | Admitting: Internal Medicine

## 2015-04-28 DIAGNOSIS — G8220 Paraplegia, unspecified: Secondary | ICD-10-CM | POA: Diagnosis not present

## 2015-04-28 DIAGNOSIS — Z931 Gastrostomy status: Secondary | ICD-10-CM | POA: Insufficient documentation

## 2015-04-28 DIAGNOSIS — J90 Pleural effusion, not elsewhere classified: Secondary | ICD-10-CM | POA: Diagnosis not present

## 2015-04-28 DIAGNOSIS — R1013 Epigastric pain: Secondary | ICD-10-CM

## 2015-04-28 DIAGNOSIS — D509 Iron deficiency anemia, unspecified: Secondary | ICD-10-CM | POA: Diagnosis not present

## 2015-04-28 DIAGNOSIS — R109 Unspecified abdominal pain: Secondary | ICD-10-CM | POA: Insufficient documentation

## 2015-04-28 DIAGNOSIS — J9811 Atelectasis: Secondary | ICD-10-CM | POA: Diagnosis not present

## 2015-04-28 DIAGNOSIS — Z8542 Personal history of malignant neoplasm of other parts of uterus: Secondary | ICD-10-CM | POA: Insufficient documentation

## 2015-04-28 DIAGNOSIS — R140 Abdominal distension (gaseous): Secondary | ICD-10-CM

## 2015-04-28 MED ORDER — IOHEXOL 300 MG/ML  SOLN
100.0000 mL | Freq: Once | INTRAMUSCULAR | Status: AC | PRN
  Administered 2015-04-28: 100 mL via INTRAVENOUS

## 2015-05-10 ENCOUNTER — Encounter (HOSPITAL_COMMUNITY): Payer: Self-pay

## 2015-05-10 ENCOUNTER — Emergency Department (HOSPITAL_COMMUNITY): Payer: Medicaid Other

## 2015-05-10 ENCOUNTER — Emergency Department (HOSPITAL_COMMUNITY)
Admission: EM | Admit: 2015-05-10 | Discharge: 2015-05-10 | Disposition: A | Discharge status: Home / Self Care | Payer: Medicaid Other | Attending: Emergency Medicine | Admitting: Emergency Medicine

## 2015-05-10 DIAGNOSIS — R05 Cough: Secondary | ICD-10-CM | POA: Diagnosis present

## 2015-05-10 DIAGNOSIS — Z8542 Personal history of malignant neoplasm of other parts of uterus: Secondary | ICD-10-CM | POA: Insufficient documentation

## 2015-05-10 DIAGNOSIS — Z79899 Other long term (current) drug therapy: Secondary | ICD-10-CM | POA: Insufficient documentation

## 2015-05-10 DIAGNOSIS — I10 Essential (primary) hypertension: Secondary | ICD-10-CM | POA: Insufficient documentation

## 2015-05-10 DIAGNOSIS — G40909 Epilepsy, unspecified, not intractable, without status epilepticus: Secondary | ICD-10-CM | POA: Diagnosis not present

## 2015-05-10 DIAGNOSIS — R600 Localized edema: Secondary | ICD-10-CM | POA: Insufficient documentation

## 2015-05-10 DIAGNOSIS — J189 Pneumonia, unspecified organism: Secondary | ICD-10-CM | POA: Insufficient documentation

## 2015-05-10 LAB — URINALYSIS, ROUTINE W REFLEX MICROSCOPIC
BILIRUBIN URINE: NEGATIVE
Glucose, UA: 100 mg/dL — AB
KETONES UR: NEGATIVE mg/dL
NITRITE: NEGATIVE
PH: 7 (ref 5.0–8.0)
Protein, ur: 100 mg/dL — AB
Specific Gravity, Urine: 1.02 (ref 1.005–1.030)

## 2015-05-10 LAB — CBC WITH DIFFERENTIAL/PLATELET
BASOS ABS: 0.1 10*3/uL (ref 0.0–0.1)
BASOS PCT: 1 %
Eosinophils Absolute: 1.2 10*3/uL — ABNORMAL HIGH (ref 0.0–0.7)
Eosinophils Relative: 10 %
HCT: 27.6 % — ABNORMAL LOW (ref 36.0–46.0)
HEMOGLOBIN: 8.7 g/dL — AB (ref 12.0–15.0)
Lymphocytes Relative: 28 %
Lymphs Abs: 3.2 10*3/uL (ref 0.7–4.0)
MCH: 27.1 pg (ref 26.0–34.0)
MCHC: 31.5 g/dL (ref 30.0–36.0)
MCV: 86 fL (ref 78.0–100.0)
Monocytes Absolute: 0.6 10*3/uL (ref 0.1–1.0)
Monocytes Relative: 5 %
NEUTROS ABS: 6.3 10*3/uL (ref 1.7–7.7)
NEUTROS PCT: 56 %
Platelets: 412 10*3/uL — ABNORMAL HIGH (ref 150–400)
RBC: 3.21 MIL/uL — AB (ref 3.87–5.11)
RDW: 14.7 % (ref 11.5–15.5)
WBC: 11.4 10*3/uL — ABNORMAL HIGH (ref 4.0–10.5)

## 2015-05-10 LAB — BASIC METABOLIC PANEL
Anion gap: 7 (ref 5–15)
BUN: 18 mg/dL (ref 6–20)
CALCIUM: 7.7 mg/dL — AB (ref 8.9–10.3)
CO2: 26 mmol/L (ref 22–32)
CREATININE: 1.04 mg/dL — AB (ref 0.44–1.00)
Chloride: 103 mmol/L (ref 101–111)
Glucose, Bld: 103 mg/dL — ABNORMAL HIGH (ref 65–99)
Potassium: 3.7 mmol/L (ref 3.5–5.1)
SODIUM: 136 mmol/L (ref 135–145)

## 2015-05-10 LAB — URINE MICROSCOPIC-ADD ON

## 2015-05-10 LAB — I-STAT CG4 LACTIC ACID, ED: LACTIC ACID, VENOUS: 0.54 mmol/L (ref 0.5–2.0)

## 2015-05-10 MED ORDER — AZITHROMYCIN 250 MG PO TABS: 250.0000 mg | ORAL_TABLET | Freq: Every day | ORAL | Status: DC

## 2015-05-10 MED ORDER — DEXTROSE 5 % IV SOLN
500.0000 mg | INTRAVENOUS | Status: DC
  Administered 2015-05-10: 500 mg via INTRAVENOUS
  Filled 2015-05-10: qty 500

## 2015-05-10 MED ORDER — DEXTROSE 5 % IV SOLN
1.0000 g | INTRAVENOUS | Status: DC
  Administered 2015-05-10: 1 g via INTRAVENOUS
  Filled 2015-05-10: qty 10

## 2015-05-10 NOTE — ED Notes (Signed)
EMS called to transport pt back home.

## 2015-05-10 NOTE — ED Notes (Signed)
MD at bedside. 

## 2015-05-10 NOTE — Discharge Instructions (Signed)

## 2015-05-10 NOTE — ED Notes (Signed)
Pt reports productive cough x 2 weeks.  Reports sore under both ribs.  Per EMS, home health nurse said lung sounded wet.  EMS reports pt's lung sound diminished in lower lobes bilaterally.  Pt has a feeding tube but pt says she eats and drinks but gets some nutrition through her feeding tube.  Denies fever.
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
## 2015-05-10 NOTE — ED Notes (Signed)
Zithromax Rx called into [facility] so it is able to be delivered to her home due to her paralysis. RN spoke with Nicki Reaper there, Rx received by him.

## 2015-05-15 ENCOUNTER — Inpatient Hospital Stay (HOSPITAL_COMMUNITY): Payer: Medicaid Other

## 2015-05-15 ENCOUNTER — Emergency Department (HOSPITAL_COMMUNITY): Payer: Medicaid Other

## 2015-05-15 ENCOUNTER — Inpatient Hospital Stay (HOSPITAL_COMMUNITY)
Admission: EM | Admit: 2015-05-15 | Discharge: 2015-05-19 | DRG: 689 | Disposition: A | Discharge status: Home Health | Payer: Medicaid Other | Attending: Internal Medicine | Admitting: Internal Medicine

## 2015-05-15 ENCOUNTER — Encounter (HOSPITAL_COMMUNITY): Payer: Self-pay | Admitting: *Deleted

## 2015-05-15 DIAGNOSIS — Z981 Arthrodesis status: Secondary | ICD-10-CM | POA: Diagnosis not present

## 2015-05-15 DIAGNOSIS — Z8701 Personal history of pneumonia (recurrent): Secondary | ICD-10-CM

## 2015-05-15 DIAGNOSIS — Z8249 Family history of ischemic heart disease and other diseases of the circulatory system: Secondary | ICD-10-CM | POA: Diagnosis not present

## 2015-05-15 DIAGNOSIS — G40909 Epilepsy, unspecified, not intractable, without status epilepticus: Secondary | ICD-10-CM

## 2015-05-15 DIAGNOSIS — Z833 Family history of diabetes mellitus: Secondary | ICD-10-CM

## 2015-05-15 DIAGNOSIS — G8220 Paraplegia, unspecified: Secondary | ICD-10-CM | POA: Diagnosis present

## 2015-05-15 DIAGNOSIS — Z8542 Personal history of malignant neoplasm of other parts of uterus: Secondary | ICD-10-CM | POA: Diagnosis not present

## 2015-05-15 DIAGNOSIS — M1990 Unspecified osteoarthritis, unspecified site: Secondary | ICD-10-CM | POA: Diagnosis present

## 2015-05-15 DIAGNOSIS — I10 Essential (primary) hypertension: Secondary | ICD-10-CM | POA: Diagnosis present

## 2015-05-15 DIAGNOSIS — R000 Tachycardia, unspecified: Secondary | ICD-10-CM

## 2015-05-15 DIAGNOSIS — G9340 Encephalopathy, unspecified: Secondary | ICD-10-CM | POA: Diagnosis present

## 2015-05-15 DIAGNOSIS — N390 Urinary tract infection, site not specified: Secondary | ICD-10-CM | POA: Diagnosis present

## 2015-05-15 DIAGNOSIS — L8994 Pressure ulcer of unspecified site, stage 4: Secondary | ICD-10-CM | POA: Diagnosis present

## 2015-05-15 DIAGNOSIS — R410 Disorientation, unspecified: Secondary | ICD-10-CM

## 2015-05-15 DIAGNOSIS — Z9359 Other cystostomy status: Secondary | ICD-10-CM | POA: Diagnosis not present

## 2015-05-15 DIAGNOSIS — R4182 Altered mental status, unspecified: Secondary | ICD-10-CM | POA: Diagnosis present

## 2015-05-15 DIAGNOSIS — Z803 Family history of malignant neoplasm of breast: Secondary | ICD-10-CM | POA: Diagnosis not present

## 2015-05-15 DIAGNOSIS — L8990 Pressure ulcer of unspecified site, unspecified stage: Secondary | ICD-10-CM | POA: Insufficient documentation

## 2015-05-15 DIAGNOSIS — G9389 Other specified disorders of brain: Secondary | ICD-10-CM | POA: Diagnosis present

## 2015-05-15 DIAGNOSIS — G9341 Metabolic encephalopathy: Secondary | ICD-10-CM | POA: Diagnosis present

## 2015-05-15 LAB — URINALYSIS, ROUTINE W REFLEX MICROSCOPIC
Bilirubin Urine: NEGATIVE
Glucose, UA: 100 mg/dL — AB
KETONES UR: NEGATIVE mg/dL
LEUKOCYTES UA: NEGATIVE
NITRITE: NEGATIVE
PH: 7 (ref 5.0–8.0)
Protein, ur: >300 mg/dL — AB
SPECIFIC GRAVITY, URINE: 1.02 (ref 1.005–1.030)

## 2015-05-15 LAB — COMPREHENSIVE METABOLIC PANEL
ALT: 19 U/L (ref 14–54)
AST: 24 U/L (ref 15–41)
Albumin: 1.5 g/dL — ABNORMAL LOW (ref 3.5–5.0)
Alkaline Phosphatase: 124 U/L (ref 38–126)
Anion gap: 3 — ABNORMAL LOW (ref 5–15)
BUN: 12 mg/dL (ref 6–20)
CHLORIDE: 111 mmol/L (ref 101–111)
CO2: 27 mmol/L (ref 22–32)
CREATININE: 1.13 mg/dL — AB (ref 0.44–1.00)
Calcium: 8.2 mg/dL — ABNORMAL LOW (ref 8.9–10.3)
GFR, EST NON AFRICAN AMERICAN: 57 mL/min — AB (ref >60)
Glucose, Bld: 108 mg/dL — ABNORMAL HIGH (ref 65–99)
POTASSIUM: 3.8 mmol/L (ref 3.5–5.1)
Sodium: 141 mmol/L (ref 135–145)
TOTAL PROTEIN: 6 g/dL — AB (ref 6.5–8.1)
Total Bilirubin: 0.4 mg/dL (ref 0.3–1.2)

## 2015-05-15 LAB — CBC
HCT: 27.7 % — ABNORMAL LOW (ref 36.0–46.0)
Hemoglobin: 8.9 g/dL — ABNORMAL LOW (ref 12.0–15.0)
MCH: 28.1 pg (ref 26.0–34.0)
MCHC: 32.1 g/dL (ref 30.0–36.0)
MCV: 87.4 fL (ref 78.0–100.0)
Platelets: 490 10*3/uL — ABNORMAL HIGH (ref 150–400)
RBC: 3.17 MIL/uL — AB (ref 3.87–5.11)
RDW: 15 % (ref 11.5–15.5)
WBC: 9.9 10*3/uL (ref 4.0–10.5)

## 2015-05-15 LAB — URINE MICROSCOPIC-ADD ON

## 2015-05-15 LAB — I-STAT CG4 LACTIC ACID, ED
LACTIC ACID, VENOUS: 1.61 mmol/L (ref 0.5–2.0)
Lactic Acid, Venous: 0.83 mmol/L (ref 0.5–2.0)

## 2015-05-15 LAB — CBG MONITORING, ED: Glucose-Capillary: 94 mg/dL (ref 65–99)

## 2015-05-15 LAB — AMMONIA: AMMONIA: 15 umol/L (ref 9–35)

## 2015-05-15 MED ORDER — ONDANSETRON HCL 4 MG PO TABS: 4.0000 mg | ORAL_TABLET | Freq: Four times a day (QID) | ORAL | Status: DC | PRN

## 2015-05-15 MED ORDER — ENOXAPARIN SODIUM 40 MG/0.4ML ~~LOC~~ SOLN
40.0000 mg | SUBCUTANEOUS | Status: DC
  Administered 2015-05-15 – 2015-05-18 (×4): 40 mg via SUBCUTANEOUS
  Filled 2015-05-15 (×4): qty 0.4

## 2015-05-15 MED ORDER — LACOSAMIDE 50 MG PO TABS
50.0000 mg | ORAL_TABLET | Freq: Two times a day (BID) | ORAL | Status: DC
  Administered 2015-05-15: 50 mg via ORAL
  Filled 2015-05-15 (×2): qty 1

## 2015-05-15 MED ORDER — CLONIDINE HCL 0.2 MG PO TABS
0.2000 mg | ORAL_TABLET | Freq: Three times a day (TID) | ORAL | Status: DC
  Administered 2015-05-15 – 2015-05-19 (×11): 0.2 mg via ORAL
  Filled 2015-05-15 (×11): qty 1

## 2015-05-15 MED ORDER — METOPROLOL TARTRATE 25 MG PO TABS
25.0000 mg | ORAL_TABLET | Freq: Two times a day (BID) | ORAL | Status: DC
  Administered 2015-05-15 – 2015-05-19 (×8): 25 mg via ORAL
  Filled 2015-05-15 (×8): qty 1

## 2015-05-15 MED ORDER — SODIUM CHLORIDE 0.9 % IJ SOLN
3.0000 mL | Freq: Two times a day (BID) | INTRAMUSCULAR | Status: DC
  Administered 2015-05-16: 3 mL via INTRAVENOUS
  Administered 2015-05-16: 10 mL via INTRAVENOUS
  Administered 2015-05-17 – 2015-05-18 (×3): 3 mL via INTRAVENOUS

## 2015-05-15 MED ORDER — SODIUM CHLORIDE 0.9 % IV SOLN
INTRAVENOUS | Status: DC
  Administered 2015-05-15 – 2015-05-16 (×2): via INTRAVENOUS

## 2015-05-15 MED ORDER — IOHEXOL 350 MG/ML SOLN
100.0000 mL | Freq: Once | INTRAVENOUS | Status: AC | PRN
Start: 2015-05-15 — End: 2015-05-15
  Administered 2015-05-15: 100 mL via INTRAVENOUS

## 2015-05-15 MED ORDER — BACLOFEN 10 MG PO TABS
10.0000 mg | ORAL_TABLET | Freq: Four times a day (QID) | ORAL | Status: DC
  Administered 2015-05-15 – 2015-05-19 (×14): 10 mg via ORAL
  Filled 2015-05-15 (×29): qty 1

## 2015-05-15 MED ORDER — DESMOPRESSIN ACETATE 0.2 MG PO TABS
200.0000 ug | ORAL_TABLET | Freq: Every day | ORAL | Status: DC
  Administered 2015-05-15 – 2015-05-18 (×4): 200 ug via ORAL
  Filled 2015-05-15 (×7): qty 1

## 2015-05-15 MED ORDER — DOCUSATE SODIUM 100 MG PO CAPS
100.0000 mg | ORAL_CAPSULE | Freq: Every day | ORAL | Status: DC
  Administered 2015-05-15 – 2015-05-18 (×4): 100 mg via ORAL
  Filled 2015-05-15 (×4): qty 1

## 2015-05-15 MED ORDER — AMLODIPINE BESYLATE 5 MG PO TABS
10.0000 mg | ORAL_TABLET | Freq: Every day | ORAL | Status: DC
  Administered 2015-05-16 – 2015-05-19 (×4): 10 mg via ORAL
  Filled 2015-05-15 (×4): qty 2

## 2015-05-15 MED ORDER — LEVETIRACETAM 500 MG PO TABS
1500.0000 mg | ORAL_TABLET | Freq: Two times a day (BID) | ORAL | Status: DC
  Administered 2015-05-15 – 2015-05-19 (×8): 1500 mg via ORAL
  Filled 2015-05-15 (×8): qty 3

## 2015-05-15 MED ORDER — VITAMIN C 500 MG PO TABS
500.0000 mg | ORAL_TABLET | Freq: Every day | ORAL | Status: DC
  Administered 2015-05-16 – 2015-05-19 (×4): 500 mg via ORAL
  Filled 2015-05-15 (×4): qty 1

## 2015-05-15 MED ORDER — FUROSEMIDE 20 MG PO TABS
20.0000 mg | ORAL_TABLET | Freq: Every day | ORAL | Status: DC
  Administered 2015-05-16 – 2015-05-19 (×4): 20 mg via ORAL
  Filled 2015-05-15 (×4): qty 1

## 2015-05-15 MED ORDER — ZINC SULFATE 220 (50 ZN) MG PO CAPS
220.0000 mg | ORAL_CAPSULE | Freq: Every day | ORAL | Status: DC
  Administered 2015-05-16 – 2015-05-18 (×3): 220 mg via ORAL
  Filled 2015-05-15 (×3): qty 1

## 2015-05-15 MED ORDER — ONDANSETRON HCL 4 MG/2ML IJ SOLN: 4.0000 mg | Freq: Four times a day (QID) | INTRAMUSCULAR | Status: DC | PRN

## 2015-05-15 MED ORDER — LEVOFLOXACIN IN D5W 500 MG/100ML IV SOLN
500.0000 mg | INTRAVENOUS | Status: DC
  Administered 2015-05-15 – 2015-05-18 (×4): 500 mg via INTRAVENOUS
  Filled 2015-05-15 (×4): qty 100

## 2015-05-15 MED ORDER — BISACODYL 10 MG RE SUPP
10.0000 mg | Freq: Every day | RECTAL | Status: DC
  Administered 2015-05-17 – 2015-05-19 (×3): 10 mg via RECTAL
  Filled 2015-05-15 (×3): qty 1

## 2015-05-15 MED ORDER — GABAPENTIN 300 MG PO CAPS
300.0000 mg | ORAL_CAPSULE | Freq: Three times a day (TID) | ORAL | Status: DC
  Administered 2015-05-15 – 2015-05-19 (×11): 300 mg via ORAL
  Filled 2015-05-15 (×11): qty 1

## 2015-05-15 MED ORDER — ONDANSETRON HCL 4 MG/2ML IJ SOLN: 4.0000 mg | Freq: Three times a day (TID) | INTRAMUSCULAR | Status: AC | PRN

## 2015-05-15 MED ORDER — PANTOPRAZOLE SODIUM 40 MG PO TBEC
40.0000 mg | DELAYED_RELEASE_TABLET | Freq: Two times a day (BID) | ORAL | Status: DC
  Administered 2015-05-15 – 2015-05-19 (×8): 40 mg via ORAL
  Filled 2015-05-15 (×8): qty 1

## 2015-05-15 MED ORDER — POTASSIUM CHLORIDE CRYS ER 20 MEQ PO TBCR
20.0000 meq | EXTENDED_RELEASE_TABLET | Freq: Every day | ORAL | Status: DC
  Administered 2015-05-16 – 2015-05-19 (×4): 20 meq via ORAL
  Filled 2015-05-15 (×4): qty 1

## 2015-05-15 NOTE — H&P (Signed)
[facility] History and Physical  [name] [id number] DOB: Dec 11, 1965 DOA: 05/15/2015  Referring physician: ER PCP: [name], MD   Chief Complaint: Altered mental status  HPI: [name] is a 48 y.o. female  This is a 49 year old lady, who is paraplegic and has a suprapubic catheter, who presents now with altered mental status. The family reports that she has not been behaving her usual self. She apparently was diagnosed with pneumonia one week ago and was treated with Zithromax orally. The patient, when I went to see her, was drowsy but aroused to her name and answered Monosyllabic responses to my questions, although these responses were appropriate. She cannot give me any other clear history. She does not think she has had a cough and she does not believe her breathing is bad.   Review of Systems:  Apart from symptoms above, all systems are negative.   Past Medical History  Diagnosis Date  . Paraplegia (lower)   . Seizure disorder ([place])   . High blood pressure   . Suprapubic catheter ([place])   . Cancer ([place])     uterine  . Seizures ([place])   . Abnormal uterine bleeding (AUB) 06/15/2014   Past Surgical History  Procedure Laterality Date  . Back surgery      Pt stated "before 2000"   Social History:  reports that she has never smoked. She has never used smokeless tobacco. She reports that she does not drink alcohol or use illicit drugs.  Allergies  Allergen Reactions  . Quinine Derivatives Other (See Comments)    Alters mental status  . Azithromycin Itching and Rash    Family History  Problem Relation Age of Onset  . Cancer Mother   . Hypertension Mother   . Cancer Sister     breast and then spread everywhere.  . Diabetes Paternal Grandmother   . Hypertension Paternal Grandmother     Prior to Admission medications   Medication Sig Start Date End Date Taking? Authorizing Provider  amLODipine (NORVASC) 10 MG tablet Take 1 tablet (10 mg  total) by mouth daily. 05/10/14  Yes Donne Hazel, MD  azithromycin (ZITHROMAX Z-PAK) 250 MG tablet Take 1 tablet (250 mg total) by mouth daily. 500mg  PO day 1, then 250mg  PO days 205 05/10/15  Yes Noemi Chapel, MD  baclofen (LIORESAL) 20 MG tablet Take 1 tablet (20 mg total) by mouth 4 (four) times daily. Patient taking differently: Take 10 mg by mouth 4 (four) times daily.  06/14/14  Yes Melvenia Beam, MD  cloNIDine (CATAPRES) 0.2 MG tablet Take 0.2 mg by mouth 3 (three) times daily.    Yes Historical Provider, MD  desmopressin (DDAVP) 0.2 MG tablet Take 200 mcg by mouth at bedtime. 04/19/15  Yes Historical Provider, MD  docusate sodium (COLACE) 100 MG capsule Take 100 mg by mouth at bedtime.   Yes Historical Provider, MD  furosemide (LASIX) 20 MG tablet Take 20 mg by mouth daily.   Yes Historical Provider, MD  gabapentin (NEURONTIN) 300 MG capsule Take 300 mg by mouth 3 (three) times daily.    Yes Historical Provider, MD  levETIRAcetam (KEPPRA) 500 MG tablet Take 3 tablets (1,500 mg total) by mouth 2 (two) times daily. 10/19/14  Yes [name], MD  metoprolol tartrate (LOPRESSOR) 25 MG tablet Take 1 tablet (25 mg total) by mouth 2 (two) times daily. 05/10/14  Yes Donne Hazel, MD  Multiple Vitamins-Minerals (MULTIVITAMIN ADULT PO) Take 1 tablet by mouth daily.  Yes Historical Provider, MD  pantoprazole (PROTONIX) 40 MG tablet Take 1 tablet (40 mg total) by mouth 2 (two) times daily. 04/12/15  Yes Jola Schmidt, MD  potassium chloride SA (K-DUR,KLOR-CON) 20 MEQ tablet Take 20 mEq by mouth daily.    Yes Historical Provider, MD  vitamin C (ASCORBIC ACID) 500 MG tablet Take 500 mg by mouth daily.   Yes Historical Provider, MD  zinc gluconate 50 MG tablet Take 50 mg by mouth daily.   Yes Historical Provider, MD  bisacodyl (DULCOLAX) 10 MG suppository Place 10 mg rectally daily.    Historical Provider, MD  lacosamide (VIMPAT) 50 MG TABS tablet Take 50 mg by mouth 2 (two) times daily.     Historical Provider, MD   Physical Exam: Filed Vitals:   05/15/15 1200 05/15/15 1300 05/15/15 1431 05/15/15 1530  BP: 143/80 133/100 152/85 130/75  Pulse: 107 99 100 87  Temp:      TempSrc:      Resp: 15 19 15 14   SpO2: 100% 98% 98% 96%    Wt Readings from Last 3 Encounters:  05/10/15 63.504 kg (140 lb)  04/18/15 72.576 kg (160 lb)  11/03/14 83.462 kg (184 lb)    General:  Appears drowsy. She does not look clinically septic. She is afebrile. Eyes: PERRL, normal lids, irises & conjunctiva ENT: grossly normal hearing, lips & tongue Neck: no LAD, masses or thyromegaly Cardiovascular: RRR, no m/r/g. No LE edema. Telemetry: SR, no arrhythmias  Respiratory: Scattered crackles at both lung bases. There is no pleural rub or bronchial breathing. Abdomen: soft, ntnd Skin: no rash or induration seen on limited exam Musculoskeletal: grossly normal tone BUE/BLE Psychiatric: Not examined. Neurologic: grossly non-focal. paraplegic.           Labs on Admission:  Basic Metabolic Panel:  Recent Labs Lab 05/10/15 1521 05/15/15 1104  NA 136 141  K 3.7 3.8  CL 103 111  CO2 26 27  GLUCOSE 103* 108*  BUN 18 12  CREATININE 1.04* 1.13*  CALCIUM 7.7* 8.2*   Liver Function Tests:  Recent Labs Lab 05/15/15 1104  AST 24  ALT 19  ALKPHOS 124  BILITOT 0.4  PROT 6.0*  ALBUMIN 1.5*   No results for input(s): LIPASE, AMYLASE in the last 168 hours.  Recent Labs Lab 05/15/15 1142  AMMONIA 15   CBC:  Recent Labs Lab 05/10/15 1521 05/15/15 1104  WBC 11.4* 9.9  NEUTROABS 6.3  --   HGB 8.7* 8.9*  HCT 27.6* 27.7*  MCV 86.0 87.4  PLT 412* 490*   Cardiac Enzymes: No results for input(s): CKTOTAL, CKMB, CKMBINDEX, TROPONINI in the last 168 hours.  BNP (last 3 results) No results for input(s): BNP in the last 8760 hours.  ProBNP (last 3 results) No results for input(s): PROBNP in the last 8760 hours.  CBG:  Recent Labs Lab 05/15/15 1058  GLUCAP 94    Radiological  Exams on Admission: Ct Head Wo Contrast  05/15/2015  CLINICAL DATA:  Altered mental status EXAM: CT HEAD WITHOUT CONTRAST TECHNIQUE: Contiguous axial images were obtained from the base of the skull through the vertex without intravenous contrast. COMPARISON:  11/18/2014 FINDINGS: No intracranial hemorrhage, mass effect or midline shift. No skull fracture is noted. Mild ventriculomegaly again noted. Stable chronic encephalomalacia left parietal lobe. No acute cortical infarction. Paranasal sinuses and mastoid air cells are unremarkable. No mass lesion is noted on this unenhanced scan. IMPRESSION: No acute intracranial abnormality. No definite acute cortical infarction. Ventriculomegaly again  noted. Stable encephalomalacia in left parietal lobe high convexity. Electronically Signed   By: Lahoma Crocker M.D.   On: 05/15/2015 13:36   Dg Chest Port 1 View  05/15/2015  CLINICAL DATA:  Altered mental status.  Unable to communicate. EXAM: PORTABLE CHEST 1 VIEW COMPARISON:  05/10/2015 FINDINGS: There is no focal parenchymal opacity. There is no pleural effusion or pneumothorax. The heart and mediastinal contours are unremarkable. There is posterior spinal fusion hardware. IMPRESSION: No active disease. Electronically Signed   By: Kathreen Devoid   On: 05/15/2015 12:04      Assessment/Plan   1. Acute encephalopathy. The etiology is not clear. I'm somewhat suspicious of a UTI based on urinalysis findings. I'm going to treat her empirically with intravenous Levaquin. Neurology consultation. 2. Resting tachycardia. CT angiogram of the chest has been ordered in case she has a pulmonary embolism. 3. Paraplegia. Stable. 4. Seizure disorder. Stable. Continue home medications.  She'll be admitted to the stepdown unit. Further recommendations will depend on patient's hospital progress.  Code Status: Full code.   DVT Prophylaxis: Lovenox.  Family Communication: I discussed the plan with the patient at the bedside,  I'm not sure how much she understood. There are no other family members present.  Disposition Plan: Depending on progress.  Time spent: 45 minutes.  Doree Albee [facility] Pager [phone].

## 2015-05-15 NOTE — ED Notes (Signed)
Dr Gosrani at bedside. 

## 2015-05-15 NOTE — Consult Note (Addendum)
Sterling A. Merlene Laughter, MD     [email or web address]          [name] is an 49 y.o. female.   ASSESSMENT/PLAN:Encephalopathy of unclear etiology. No clear metabolic or infectious etiologies uncovered. The patient has been treated for pneumonia over the past week however. The patient doesn't have significant dysarthria was suggest an acute infarct are seen in CT. The spontaneous recovery also suggest a unwitnessed seizure in this patient who has a known history of epilepsy.  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  Traumatic paraplegia.    RECOMMENDATION: EEG. Brain MRI without contrast.  Increase [name] 200 mg twice a day. Continue with Keppra 1500 mg twice a day.  The patient is a 49 year old Hispanic American female who has had confusion this morning. She apparently woke up being confused and not herself for the family. She has had multiple admissions for the same complaints. The etiologies have buried including seizures, infections, toxic metabolic processes. The patient's confusion seemed to have improved but she is clearly still not baseline. It clearly seem to have significant confusion and disorientation. No clear focal acute deficits are reported. Review of systems Limited given the confusion. The patient was hospitalized here in May of this year for encephalopathy. The workup suggest seizures as a wall the possible etiology. She was started on Vimpat low-dose. She was previously on Keppra and this was continued.  GENERAL: Drowsy but in now acute distress.  HEENT: Supple. Atraumatic normocephalic.   ABDOMEN: soft  EXTREMITIES: No edema   BACK: Normal.  SKIN: Normal by inspection.    MENTAL STATUS: She lays in bed with eyes closed. She does follow commands. She does follow midline commands consistently. She follows axial commands with prompting.  CRANIAL NERVES: Pupils are quite large about 7  mm and sluggishly reactive; extra ocular movements are full, there is no significant nystagmus; visual fields - limited due to the cognition appears to be full; there appears to be mild flattening of the nasolabial fold on the right side; otherwise facial muscles are symmetric; tongue midline.  MOTOR: Upper extremity strength graded as 3/5. Leg 0.  COORDINATION: There is no dysmetria or tremors appreciated.  REFLEXES: Deep tendon reflexes are symmetrical and normal - in the upper extremities.   Blood pressure 110/88, pulse 114, temperature 97.7 F (36.5 C), temperature source Oral, resp. rate 14, last menstrual period 05/03/2015, SpO2 98 %.  Past Medical History  Diagnosis Date  . Paraplegia (lower)   . Seizure disorder ([place])   . High blood pressure   . Suprapubic catheter ([place])   . Cancer ([place])     uterine  . Seizures ([place])   . Abnormal uterine bleeding (AUB) 06/15/2014    Past Surgical History  Procedure Laterality Date  . Back surgery      Pt stated "before 2000"    Family History  Problem Relation Age of Onset  . Cancer Mother   . Hypertension Mother   . Cancer Sister     breast and then spread everywhere.  . Diabetes Paternal Grandmother   . Hypertension Paternal Grandmother     Social History:  reports that she has never smoked. She has never used smokeless tobacco. She reports that she does not drink alcohol or use illicit drugs.  Allergies:  Allergies  Allergen Reactions  . Quinine Derivatives Other (See Comments)    Alters mental status  . Azithromycin Itching and Rash    Medications:  Prior to Admission medications   Medication Sig Start Date End Date Taking? Authorizing Provider  amLODipine (NORVASC) 10 MG tablet Take 1 tablet (10 mg total) by mouth daily. 05/10/14  Yes Donne Hazel, MD  azithromycin (ZITHROMAX Z-PAK) 250 MG tablet Take 1 tablet (250 mg total) by mouth daily. 535m PO day 1, then 2530mPO days 205 05/10/15  Yes BrNoemi ChapelMD    baclofen (LIORESAL) 20 MG tablet Take 1 tablet (20 mg total) by mouth 4 (four) times daily. Patient taking differently: Take 10 mg by mouth 4 (four) times daily.  06/14/14  Yes AnMelvenia BeamMD  cloNIDine (CATAPRES) 0.2 MG tablet Take 0.2 mg by mouth 3 (three) times daily.    Yes Historical Provider, MD  desmopressin (DDAVP) 0.2 MG tablet Take 200 mcg by mouth at bedtime. 04/19/15  Yes Historical Provider, MD  docusate sodium (COLACE) 100 MG capsule Take 100 mg by mouth at bedtime.   Yes Historical Provider, MD  furosemide (LASIX) 20 MG tablet Take 20 mg by mouth daily.   Yes Historical Provider, MD  gabapentin (NEURONTIN) 300 MG capsule Take 300 mg by mouth 3 (three) times daily.    Yes Historical Provider, MD  levETIRAcetam (KEPPRA) 500 MG tablet Take 3 tablets (1,500 mg total) by mouth 2 (two) times daily. 10/19/14  Yes TeRosita FireMD  metoprolol tartrate (LOPRESSOR) 25 MG tablet Take 1 tablet (25 mg total) by mouth 2 (two) times daily. 05/10/14  Yes StDonne HazelMD  Multiple Vitamins-Minerals (MULTIVITAMIN ADULT PO) Take 1 tablet by mouth daily.    Yes Historical Provider, MD  pantoprazole (PROTONIX) 40 MG tablet Take 1 tablet (40 mg total) by mouth 2 (two) times daily. 04/12/15  Yes KeJola SchmidtMD  potassium chloride SA (K-DUR,KLOR-CON) 20 MEQ tablet Take 20 mEq by mouth daily.    Yes Historical Provider, MD  vitamin C (ASCORBIC ACID) 500 MG tablet Take 500 mg by mouth daily.   Yes Historical Provider, MD  zinc gluconate 50 MG tablet Take 50 mg by mouth daily.   Yes Historical Provider, MD  bisacodyl (DULCOLAX) 10 MG suppository Place 10 mg rectally daily.    Historical Provider, MD  lacosamide (VIMPAT) 50 MG TABS tablet Take 50 mg by mouth 2 (two) times daily.    Historical Provider, MD    Scheduled Meds: . amLODipine  10 mg Oral Daily  . baclofen  10 mg Oral QID  . bisacodyl  10 mg Rectal Daily  . cloNIDine  0.2 mg Oral TID  . desmopressin  200 mcg Oral QHS  . docusate  sodium  100 mg Oral QHS  . enoxaparin (LOVENOX) injection  40 mg Subcutaneous Q24H  . furosemide  20 mg Oral Daily  . gabapentin  300 mg Oral TID  . lacosamide  50 mg Oral BID  . levETIRAcetam  1,500 mg Oral BID  . levofloxacin (LEVAQUIN) IV  500 mg Intravenous Q24H  . metoprolol tartrate  25 mg Oral BID  . pantoprazole  40 mg Oral BID  . potassium chloride SA  20 mEq Oral Daily  . sodium chloride  3 mL Intravenous Q12H  . [START ON 05/16/2015] vitamin C  500 mg Oral Daily  . [START ON 05/16/2015] zinc sulfate  220 mg Oral Daily   Continuous Infusions: . sodium chloride 50 mL/hr at 05/15/15 1752   PRN Meds:.ondansetron (ZOFRAN) IV, ondansetron **OR** ondansetron (ZOFRAN) IV     Results for orders placed or performed during  the hospital encounter of 05/15/15 (from the past 48 hour(s))  CBG monitoring, ED     Status: None   Collection Time: 05/15/15 10:58 AM  Result Value Ref Range   Glucose-Capillary 94 65 - 99 mg/dL  Comprehensive metabolic panel     Status: Abnormal   Collection Time: 05/15/15 11:04 AM  Result Value Ref Range   Sodium 141 135 - 145 mmol/L   Potassium 3.8 3.5 - 5.1 mmol/L   Chloride 111 101 - 111 mmol/L   CO2 27 22 - 32 mmol/L   Glucose, Bld 108 (H) 65 - 99 mg/dL   BUN 12 6 - 20 mg/dL   Creatinine, Ser 1.13 (H) 0.44 - 1.00 mg/dL   Calcium 8.2 (L) 8.9 - 10.3 mg/dL   Total Protein 6.0 (L) 6.5 - 8.1 g/dL   Albumin 1.5 (L) 3.5 - 5.0 g/dL   AST 24 15 - 41 U/L   ALT 19 14 - 54 U/L   Alkaline Phosphatase 124 38 - 126 U/L   Total Bilirubin 0.4 0.3 - 1.2 mg/dL   GFR calc non Af Amer 57 (L) >60 mL/min   GFR calc Af Amer >60 >60 mL/min    Comment: (NOTE) The eGFR has been calculated using the CKD EPI equation. This calculation has not been validated in all clinical situations. eGFR's persistently <60 mL/min signify possible Chronic Kidney Disease.    Anion gap 3 (L) 5 - 15  CBC     Status: Abnormal   Collection Time: 05/15/15 11:04 AM  Result Value Ref  Range   WBC 9.9 4.0 - 10.5 K/uL   RBC 3.17 (L) 3.87 - 5.11 MIL/uL   Hemoglobin 8.9 (L) 12.0 - 15.0 g/dL   HCT 27.7 (L) 36.0 - 46.0 %   MCV 87.4 78.0 - 100.0 fL   MCH 28.1 26.0 - 34.0 pg   MCHC 32.1 30.0 - 36.0 g/dL   RDW 15.0 11.5 - 15.5 %   Platelets 490 (H) 150 - 400 K/uL  I-Stat CG4 Lactic Acid, ED (Not at [facility])     Status: None   Collection Time: 05/15/15 11:36 AM  Result Value Ref Range   Lactic Acid, Venous 1.61 0.5 - 2.0 mmol/L  Urinalysis, Routine w reflex microscopic (not at [facility])     Status: Abnormal   Collection Time: 05/15/15 11:40 AM  Result Value Ref Range   Color, Urine YELLOW YELLOW   APPearance CLEAR CLEAR   Specific Gravity, Urine 1.020 1.005 - 1.030   pH 7.0 5.0 - 8.0   Glucose, UA 100 (A) NEGATIVE mg/dL   Hgb urine dipstick SMALL (A) NEGATIVE   Bilirubin Urine NEGATIVE NEGATIVE   Ketones, ur NEGATIVE NEGATIVE mg/dL   Protein, ur >300 (A) NEGATIVE mg/dL   Nitrite NEGATIVE NEGATIVE   Leukocytes, UA NEGATIVE NEGATIVE  Urine microscopic-add on     Status: Abnormal   Collection Time: 05/15/15 11:40 AM  Result Value Ref Range   Squamous Epithelial / LPF 6-30 (A) NONE SEEN   WBC, UA 0-5 0 - 5 WBC/hpf   RBC / HPF 0-5 0 - 5 RBC/hpf   Bacteria, UA MANY (A) NONE SEEN  Ammonia     Status: None   Collection Time: 05/15/15 11:42 AM  Result Value Ref Range   Ammonia 15 9 - 35 umol/L  I-Stat CG4 Lactic Acid, ED (Not at [facility])     Status: None   Collection Time: 05/15/15  2:36 PM  Result Value Ref Range  Lactic Acid, Venous 0.83 0.5 - 2.0 mmol/L    Studies/Results:   [[[[[[[[[[[[[[[PRIOR W/U CT Head w/o 11/18/14 IMPRESSION: 1. No acute intracranial pathology seen on CT. 2. Stable appearance to supratentorial ventricular prominence, raising question for mild hydrocephalus. 3. Chronic encephalomalacia at the high left parietal lobe, reflecting remote infarct.  CT Head w/o 10/17/14 IMPRESSION: Motion degraded exam. Moderate ventriculomegaly out of proportion  to the degree of cortical volume loss. NPH not excluded. Remote LEFT frontal infarct. No acute intracranial findings.  EEG 10/17/14 parietal slowing which can be associated with focal cerebral disturbance or epileptic focus.  EEG 10/10/14 The recording shows occasional frontal frontal intermittent rhythmic delta activity and triphasic waves. Both are typical seen in metabolic disturbances.   MR. Brain w/wo 10/04/14 IMPRESSION:  Abnormal MRI brain (with and without) demonstrating: 1. Stable left frontal/parietal convexity, focal encephalomalacia with gliosis, likely from chronic ischemic infarction. No evidence to suggest a progressive neoplastic/inflammatory process at this time. 2. Stable foci of hemosiderin deposition (chronic hemorrhage) in the left frontal region, body of corpus callosum and punctate focus in the left periventricular white matter. 3. Severe corpus callosum atrophy. Mild ventriculomegaly on ex vacuo basis. 4. No acute findings. No abnormal enhancing lesions. 5. Overall no significant change from MRI on 07/06/14.  EEG 05/21/13 IMPRESSION: Abnormal recording due to the following.  1. Moderate generalized slowing.  2. Infrequent left frontal-central epileptiform discharges.]]]]]]]]]]]]]]]]]     HEAD CT 05-15-15 No intracranial hemorrhage, mass effect or midline shift. No skull fracture is noted. Mild ventriculomegaly again noted. Stable chronic encephalomalacia left parietal lobe. No acute cortical infarction. Paranasal sinuses and mastoid air cells are unremarkable. No mass lesion is noted on this unenhanced scan.   IMPRESSION: No acute intracranial abnormality. No definite acute cortical infarction. Ventriculomegaly again noted. Stable encephalomalacia in left parietal lobe high convexity.  CTA CHEST No demonstrable pulmonary embolus.   Bilateral pleural effusions, larger on the right than on the left with bibasilar atelectasis, more severe on the right than  on the left. Superimposed pneumonia in the right base cannot be excluded.   There is extensive thoracic spine fixation. Note that there is offset of the vertebral bodies at T7-T8.   There is hepatic steatosis.     Amanie Mcculley A. Merlene Laughter, M.D.  Diplomate, Tax adviser of Psychiatry and Neurology ( Neurology). 05/15/2015, 6:07 PM

## 2015-05-15 NOTE — ED Notes (Signed)
Lab at bedside

## 2015-05-15 NOTE — ED Notes (Signed)
Pt comes in via EMS for altered mental status. Per family pt woke up not acting herself. Pt was recently treated for pneumonia prescribed and antibiotic. Pt then began to have a rash so family treated with benadryl.

## 2015-05-15 NOTE — Progress Notes (Signed)
Dr. Merlene Laughter notified of consult.   Multiple wounds noted upon assessment and charted. WoundVac present but dressings had come undone. WoundVac removed and pink foam dressings applied. Wound consult initiated for further instructions.

## 2015-05-15 NOTE — ED Provider Notes (Signed)
CSN: [id number]     Arrival date & time 05/15/15  1041 History  By signing my name below, I, Terressa Koyanagi, attest that this documentation has been prepared under the direction and in the presence of Noemi Chapel, MD. Electronically Signed: Terressa Koyanagi, ED Scribe. 05/15/2015. 11:24 AM.  LEVEL 5 CAVEAT  Chief Complaint  Patient presents with  . Altered Mental Status   The history is provided by the EMS personnel. No language interpreter was used.   PCP: [name], MD HPI Comments: [name] is a 49 y.o. female, with PMHx noted below, arriving via ambulance, who presents to the Emergency Department complaining of altered mental status. Per EMS, pt's family reported that pt woke up not behaving like herself. Pt was Dx with pneumonia one week ago and is being treated with zithromax.   Past Medical History  Diagnosis Date  . Paraplegia (lower)   . Seizure disorder ([place])   . High blood pressure   . Suprapubic catheter ([place])   . Cancer ([place])     uterine  . Seizures ([place])   . Abnormal uterine bleeding (AUB) 06/15/2014   Past Surgical History  Procedure Laterality Date  . Back surgery      Pt stated "before 2000"   Family History  Problem Relation Age of Onset  . Cancer Mother   . Hypertension Mother   . Cancer Sister     breast and then spread everywhere.  . Diabetes Paternal Grandmother   . Hypertension Paternal Grandmother    Social History  Substance Use Topics  . Smoking status: Never Smoker   . Smokeless tobacco: Never Used  . Alcohol Use: No   OB History    Gravida Para Term Preterm AB TAB SAB Ectopic Multiple Living   4 3   1  1   3      Review of Systems  Unable to perform ROS: Patient nonverbal  Endocrine: Negative for polydipsia.    Allergies  Quinine derivatives and Azithromycin  Home Medications   Prior to Admission medications   Medication Sig Start Date End Date Taking? Authorizing Provider  amLODipine (NORVASC) 10 MG tablet Take 1 tablet  (10 mg total) by mouth daily. 05/10/14  Yes Donne Hazel, MD  azithromycin (ZITHROMAX Z-PAK) 250 MG tablet Take 1 tablet (250 mg total) by mouth daily. 500mg  PO day 1, then 250mg  PO days 205 05/10/15  Yes Noemi Chapel, MD  baclofen (LIORESAL) 20 MG tablet Take 1 tablet (20 mg total) by mouth 4 (four) times daily. Patient taking differently: Take 10 mg by mouth 4 (four) times daily.  06/14/14  Yes Melvenia Beam, MD  cloNIDine (CATAPRES) 0.2 MG tablet Take 0.2 mg by mouth 3 (three) times daily.    Yes Historical Provider, MD  desmopressin (DDAVP) 0.2 MG tablet Take 200 mcg by mouth at bedtime. 04/19/15  Yes Historical Provider, MD  docusate sodium (COLACE) 100 MG capsule Take 100 mg by mouth at bedtime.   Yes Historical Provider, MD  furosemide (LASIX) 20 MG tablet Take 20 mg by mouth daily.   Yes Historical Provider, MD  gabapentin (NEURONTIN) 300 MG capsule Take 300 mg by mouth 3 (three) times daily.    Yes Historical Provider, MD  levETIRAcetam (KEPPRA) 500 MG tablet Take 3 tablets (1,500 mg total) by mouth 2 (two) times daily. 10/19/14  Yes [name], MD  metoprolol tartrate (LOPRESSOR) 25 MG tablet Take 1 tablet (25 mg total) by mouth 2 (two) times daily. 05/10/14  Yes Donne Hazel, MD  Multiple Vitamins-Minerals (MULTIVITAMIN ADULT PO) Take 1 tablet by mouth daily.    Yes Historical Provider, MD  pantoprazole (PROTONIX) 40 MG tablet Take 1 tablet (40 mg total) by mouth 2 (two) times daily. 04/12/15  Yes Jola Schmidt, MD  potassium chloride SA (K-DUR,KLOR-CON) 20 MEQ tablet Take 20 mEq by mouth daily.    Yes Historical Provider, MD  vitamin C (ASCORBIC ACID) 500 MG tablet Take 500 mg by mouth daily.   Yes Historical Provider, MD  zinc gluconate 50 MG tablet Take 50 mg by mouth daily.   Yes Historical Provider, MD  bisacodyl (DULCOLAX) 10 MG suppository Place 10 mg rectally daily.    Historical Provider, MD  lacosamide (VIMPAT) 50 MG TABS tablet Take 50 mg by mouth 2 (two) times daily.     Historical Provider, MD   Triage Vitals: Pulse 99  Temp(Src) 97.7 F (36.5 C) (Oral)  Resp 16  SpO2 98%  LMP 05/03/2015 Physical Exam  Constitutional: She appears well-developed and well-nourished. No distress.  HENT:  Head: Normocephalic and atraumatic.  Mouth/Throat: Oropharynx is clear and moist. No oropharyngeal exudate.  Eyes: Conjunctivae and EOM are normal. Pupils are equal, round, and reactive to light. Right eye exhibits no discharge. Left eye exhibits no discharge. No scleral icterus.  Neck: Normal range of motion. Neck supple. No JVD present. No thyromegaly present.  Cardiovascular: Regular rhythm, normal heart sounds and intact distal pulses.  Tachycardia present.  Exam reveals no gallop and no friction rub.   No murmur heard. Pulmonary/Chest: Effort normal and breath sounds normal. No respiratory distress. She has no wheezes. She has no rales.  Abdominal: Soft. Bowel sounds are normal. She exhibits no distension and no mass. There is no tenderness.  Musculoskeletal: Normal range of motion. She exhibits edema (2+ pitting edema bilaterally, unchanged from last time seen by me 5 days ago). She exhibits no tenderness.  Lymphadenopathy:    She has no cervical adenopathy.  Neurological: She is alert. Coordination normal.  Skin: Skin is warm and dry. No rash noted. No erythema.  Multiple excoriations to the skin diffusely, arms, chest  Nursing note and vitals reviewed.   ED Course  Procedures (including critical care time) DIAGNOSTIC STUDIES: Oxygen Saturation is 98% on ra, nl by my interpretation.    Labs Review Labs Reviewed  COMPREHENSIVE METABOLIC PANEL - Abnormal; Notable for the following:    Glucose, Bld 108 (*)    Creatinine, Ser 1.13 (*)    Calcium 8.2 (*)    Total Protein 6.0 (*)    Albumin 1.5 (*)    GFR calc non Af Amer 57 (*)    Anion gap 3 (*)    All other components within normal limits  CBC - Abnormal; Notable for the following:    RBC 3.17 (*)     Hemoglobin 8.9 (*)    HCT 27.7 (*)    Platelets 490 (*)    All other components within normal limits  URINALYSIS, ROUTINE W REFLEX MICROSCOPIC (NOT AT [facility]) - Abnormal; Notable for the following:    Glucose, UA 100 (*)    Hgb urine dipstick SMALL (*)    Protein, ur >300 (*)    All other components within normal limits  URINE MICROSCOPIC-ADD ON - Abnormal; Notable for the following:    Squamous Epithelial / LPF 6-30 (*)    Bacteria, UA MANY (*)    All other components within normal limits  CULTURE, BLOOD (ROUTINE X  2)  CULTURE, BLOOD (ROUTINE X 2)  URINE CULTURE  AMMONIA  CBG MONITORING, ED  I-STAT CG4 LACTIC ACID, ED  I-STAT CG4 LACTIC ACID, ED    Imaging Review Ct Head Wo Contrast  05/15/2015  CLINICAL DATA:  Altered mental status EXAM: CT HEAD WITHOUT CONTRAST TECHNIQUE: Contiguous axial images were obtained from the base of the skull through the vertex without intravenous contrast. COMPARISON:  11/18/2014 FINDINGS: No intracranial hemorrhage, mass effect or midline shift. No skull fracture is noted. Mild ventriculomegaly again noted. Stable chronic encephalomalacia left parietal lobe. No acute cortical infarction. Paranasal sinuses and mastoid air cells are unremarkable. No mass lesion is noted on this unenhanced scan. IMPRESSION: No acute intracranial abnormality. No definite acute cortical infarction. Ventriculomegaly again noted. Stable encephalomalacia in left parietal lobe high convexity. Electronically Signed   By: Lahoma Crocker M.D.   On: 05/15/2015 13:36   Dg Chest Port 1 View  05/15/2015  CLINICAL DATA:  Altered mental status.  Unable to communicate. EXAM: PORTABLE CHEST 1 VIEW COMPARISON:  05/10/2015 FINDINGS: There is no focal parenchymal opacity. There is no pleural effusion or pneumothorax. The heart and mediastinal contours are unremarkable. There is posterior spinal fusion hardware. IMPRESSION: No active disease. Electronically Signed   By: Kathreen Devoid   On: 05/15/2015  12:04   I have personally reviewed and evaluated these images and lab results as part of my medical decision-making.   EKG Interpretation   Date/Time:  Monday May 15 2015 10:52:25 EST Ventricular Rate:  105 PR Interval:    QRS Duration: 108 QT Interval:  365 QTC Calculation: 482 R Axis:   -39 Text Interpretation:  Sinus tachycardia Inferior infarct, old Since last  tracing rate faster Abnormal ekg Confirmed by Kailynn Satterly  MD, Willam Munford ([id number]) on  05/15/2015 11:36:43 AM      MDM   Final diagnoses:  Tachycardia  Encephalopathy acute    The pt is not following commands - she has no fever and baseline tachycardia is borderling - she has normla BP and her CBC has normal WBC which is improved from prior.  Xray without obvious pna.  UA without obvoius infection -   CT head neg for stroke / bleed  Check r/o for PE - d/w Dr. Anastasio Champion who will admit to step down.  I personally performed the services described in this documentation, which was scribed in my presence. The recorded information has been reviewed and is accurate.       Noemi Chapel, MD 05/15/15 1520

## 2015-05-16 ENCOUNTER — Inpatient Hospital Stay (HOSPITAL_COMMUNITY): Payer: Medicaid Other

## 2015-05-16 ENCOUNTER — Inpatient Hospital Stay (HOSPITAL_COMMUNITY)
Admit: 2015-05-16 | Discharge: 2015-05-16 | Disposition: A | Discharge status: Home / Self Care | Payer: Medicaid Other | Attending: Neurology | Admitting: Neurology

## 2015-05-16 DIAGNOSIS — L8990 Pressure ulcer of unspecified site, unspecified stage: Secondary | ICD-10-CM | POA: Insufficient documentation

## 2015-05-16 LAB — COMPREHENSIVE METABOLIC PANEL
ALBUMIN: 1.2 g/dL — AB (ref 3.5–5.0)
ALK PHOS: 98 U/L (ref 38–126)
ALT: 16 U/L (ref 14–54)
ANION GAP: 4 — AB (ref 5–15)
AST: 19 U/L (ref 15–41)
BILIRUBIN TOTAL: 0.2 mg/dL — AB (ref 0.3–1.2)
BUN: 12 mg/dL (ref 6–20)
CALCIUM: 7.5 mg/dL — AB (ref 8.9–10.3)
CO2: 24 mmol/L (ref 22–32)
CREATININE: 1.06 mg/dL — AB (ref 0.44–1.00)
Chloride: 109 mmol/L (ref 101–111)
GFR calc Af Amer: >60 mL/min (ref >60)
GFR calc non Af Amer: >60 mL/min (ref >60)
GLUCOSE: 94 mg/dL (ref 65–99)
Potassium: 3.5 mmol/L (ref 3.5–5.1)
Sodium: 137 mmol/L (ref 135–145)
TOTAL PROTEIN: 4.8 g/dL — AB (ref 6.5–8.1)

## 2015-05-16 LAB — CBC
HCT: 23.5 % — ABNORMAL LOW (ref 36.0–46.0)
Hemoglobin: 7.6 g/dL — ABNORMAL LOW (ref 12.0–15.0)
MCH: 28.4 pg (ref 26.0–34.0)
MCHC: 32.3 g/dL (ref 30.0–36.0)
MCV: 87.7 fL (ref 78.0–100.0)
PLATELETS: 397 10*3/uL (ref 150–400)
RBC: 2.68 MIL/uL — ABNORMAL LOW (ref 3.87–5.11)
RDW: 15.2 % (ref 11.5–15.5)
WBC: 7 10*3/uL (ref 4.0–10.5)

## 2015-05-16 LAB — MRSA PCR SCREENING: MRSA BY PCR: POSITIVE — AB

## 2015-05-16 LAB — IRON AND TIBC
Iron: 25 ug/dL — ABNORMAL LOW (ref 28–170)
SATURATION RATIOS: 19 % (ref 10.4–31.8)
TIBC: 133 ug/dL — ABNORMAL LOW (ref 250–450)
UIBC: 108 ug/dL

## 2015-05-16 LAB — RETICULOCYTES
RBC.: 2.82 MIL/uL — AB (ref 3.87–5.11)
RETIC CT PCT: 2.6 % (ref 0.4–3.1)
Retic Count, Absolute: 73.3 10*3/uL (ref 19.0–186.0)

## 2015-05-16 LAB — FERRITIN: FERRITIN: 46 ng/mL (ref 11–307)

## 2015-05-16 LAB — PREPARE RBC (CROSSMATCH)

## 2015-05-16 LAB — VITAMIN B12: VITAMIN B 12: 517 pg/mL (ref 180–914)

## 2015-05-16 LAB — ABO/RH: ABO/RH(D): A POS

## 2015-05-16 LAB — FOLATE: Folate: 39.5 ng/mL (ref >5.9)

## 2015-05-16 MED ORDER — LACOSAMIDE 50 MG PO TABS
100.0000 mg | ORAL_TABLET | Freq: Two times a day (BID) | ORAL | Status: DC
  Administered 2015-05-16 – 2015-05-19 (×7): 100 mg via ORAL
  Filled 2015-05-16 (×7): qty 2

## 2015-05-16 MED ORDER — HYDROCERIN EX CREA
1.0000 "application " | TOPICAL_CREAM | Freq: Every day | CUTANEOUS | Status: DC
  Administered 2015-05-17 – 2015-05-19 (×3): 1 via TOPICAL
  Filled 2015-05-16: qty 113

## 2015-05-16 MED ORDER — MUPIROCIN 2 % EX OINT
1.0000 "application " | TOPICAL_OINTMENT | Freq: Two times a day (BID) | CUTANEOUS | Status: DC
  Administered 2015-05-16 – 2015-05-19 (×7): 1 via NASAL
  Filled 2015-05-16 (×2): qty 22

## 2015-05-16 MED ORDER — GADOBENATE DIMEGLUMINE 529 MG/ML IV SOLN
15.0000 mL | Freq: Once | INTRAVENOUS | Status: AC | PRN
  Administered 2015-05-16: 15 mL via INTRAVENOUS

## 2015-05-16 MED ORDER — CHLORHEXIDINE GLUCONATE CLOTH 2 % EX PADS
6.0000 | MEDICATED_PAD | Freq: Every day | CUTANEOUS | Status: DC
  Administered 2015-05-16 – 2015-05-19 (×3): 6 via TOPICAL

## 2015-05-16 MED ORDER — SODIUM CHLORIDE 0.9 % IV SOLN: Freq: Once | INTRAVENOUS | Status: DC

## 2015-05-16 NOTE — Progress Notes (Signed)
EEG Completed; Results Pending  

## 2015-05-16 NOTE — Progress Notes (Signed)
Subjective: Patient was admitted yesterday due to change in mental status probably from UTI. She is started on Iv Levaquin. Patient is more awake today and eels better. No fever or chills.  Objective: Vital signs in last 24 hours: Temp:  [96.9 F (36.1 C)-98 F (36.7 C)] 98 F (36.7 C) (12/20 0822) Pulse Rate:  [59-114] 67 (12/20 1100) Resp:  [7-37] 9 (12/20 1100) BP: (98-171)/(64-106) 113/80 mmHg (12/20 1100) SpO2:  [93 %-100 %] 98 % (12/20 1100) Weight:  [64.1 kg (141 lb 5 oz)-74.6 kg (164 lb 7.4 oz)] 74.6 kg (164 lb 7.4 oz) (12/20 0500) Weight change:  Last BM Date: 05/15/15  Intake/Output from previous day: 12/19 0701 - 12/20 0700 In: 406.7 [I.V.:406.7] Out: 800 [Drains:800]  PHYSICAL EXAM General appearance: no distress and slowed mentation Resp: clear to auscultation bilaterally Cardio: S1, S2 normal GI: soft, non-tender; bowel sounds normal; no masses,  no organomegaly Extremities: paraplegic  Lab Results:  Results for orders placed or performed during the hospital encounter of 05/15/15 (from the past 48 hour(s))  CBG monitoring, ED     Status: None   Collection Time: 05/15/15 10:58 AM  Result Value Ref Range   Glucose-Capillary 94 65 - 99 mg/dL  Comprehensive metabolic panel     Status: Abnormal   Collection Time: 05/15/15 11:04 AM  Result Value Ref Range   Sodium 141 135 - 145 mmol/L   Potassium 3.8 3.5 - 5.1 mmol/L   Chloride 111 101 - 111 mmol/L   CO2 27 22 - 32 mmol/L   Glucose, Bld 108 (H) 65 - 99 mg/dL   BUN 12 6 - 20 mg/dL   Creatinine, Ser 1.13 (H) 0.44 - 1.00 mg/dL   Calcium 8.2 (L) 8.9 - 10.3 mg/dL   Total Protein 6.0 (L) 6.5 - 8.1 g/dL   Albumin 1.5 (L) 3.5 - 5.0 g/dL   AST 24 15 - 41 U/L   ALT 19 14 - 54 U/L   Alkaline Phosphatase 124 38 - 126 U/L   Total Bilirubin 0.4 0.3 - 1.2 mg/dL   GFR calc non Af Amer 57 (L) >60 mL/min   GFR calc Af Amer >60 >60 mL/min    Comment: (NOTE) The eGFR has been calculated using the CKD EPI equation. This  calculation has not been validated in all clinical situations. eGFR's persistently <60 mL/min signify possible Chronic Kidney Disease.    Anion gap 3 (L) 5 - 15  CBC     Status: Abnormal   Collection Time: 05/15/15 11:04 AM  Result Value Ref Range   WBC 9.9 4.0 - 10.5 K/uL   RBC 3.17 (L) 3.87 - 5.11 MIL/uL   Hemoglobin 8.9 (L) 12.0 - 15.0 g/dL   HCT 27.7 (L) 36.0 - 46.0 %   MCV 87.4 78.0 - 100.0 fL   MCH 28.1 26.0 - 34.0 pg   MCHC 32.1 30.0 - 36.0 g/dL   RDW 15.0 11.5 - 15.5 %   Platelets 490 (H) 150 - 400 K/uL  Culture, blood (routine x 2)     Status: None (Preliminary result)   Collection Time: 05/15/15 11:04 AM  Result Value Ref Range   Specimen Description BLOOD    Special Requests NONE    Culture NO GROWTH < 24 HOURS    Report Status PENDING   I-Stat CG4 Lactic Acid, ED (Not at [facility])     Status: None   Collection Time: 05/15/15 11:36 AM  Result Value Ref Range   Lactic Acid, Venous  1.61 0.5 - 2.0 mmol/L  Urinalysis, Routine w reflex microscopic (not at [facility])     Status: Abnormal   Collection Time: 05/15/15 11:40 AM  Result Value Ref Range   Color, Urine YELLOW YELLOW   APPearance CLEAR CLEAR   Specific Gravity, Urine 1.020 1.005 - 1.030   pH 7.0 5.0 - 8.0   Glucose, UA 100 (A) NEGATIVE mg/dL   Hgb urine dipstick SMALL (A) NEGATIVE   Bilirubin Urine NEGATIVE NEGATIVE   Ketones, ur NEGATIVE NEGATIVE mg/dL   Protein, ur >300 (A) NEGATIVE mg/dL   Nitrite NEGATIVE NEGATIVE   Leukocytes, UA NEGATIVE NEGATIVE  Urine culture     Status: None (Preliminary result)   Collection Time: 05/15/15 11:40 AM  Result Value Ref Range   Specimen Description URINE, CATHETERIZED    Special Requests NONE    Culture      TOO YOUNG TO READ Performed at [facility]    Report Status PENDING   Urine microscopic-add on     Status: Abnormal   Collection Time: 05/15/15 11:40 AM  Result Value Ref Range   Squamous Epithelial / LPF 6-30 (A) NONE SEEN   WBC, UA 0-5 0 - 5 WBC/hpf    RBC / HPF 0-5 0 - 5 RBC/hpf   Bacteria, UA MANY (A) NONE SEEN  Ammonia     Status: None   Collection Time: 05/15/15 11:42 AM  Result Value Ref Range   Ammonia 15 9 - 35 umol/L  Culture, blood (routine x 2)     Status: None (Preliminary result)   Collection Time: 05/15/15 11:47 AM  Result Value Ref Range   Specimen Description BLOOD    Special Requests NONE    Culture NO GROWTH < 24 HOURS    Report Status PENDING   I-Stat CG4 Lactic Acid, ED (Not at [facility])     Status: None   Collection Time: 05/15/15  2:36 PM  Result Value Ref Range   Lactic Acid, Venous 0.83 0.5 - 2.0 mmol/L  MRSA PCR Screening     Status: Abnormal   Collection Time: 05/15/15  5:30 PM  Result Value Ref Range   MRSA by PCR POSITIVE (A) NEGATIVE    Comment:        The GeneXpert MRSA Assay (FDA approved for NASAL specimens only), is one component of a comprehensive MRSA colonization surveillance program. It is not intended to diagnose MRSA infection nor to guide or monitor treatment for MRSA infections. RESULT CALLED TO, READ BACK BY AND VERIFIED WITH: HAMMOCK S AT 0103 ON 122016 BY FORSYTH K   Comprehensive metabolic panel     Status: Abnormal   Collection Time: 05/16/15  4:16 AM  Result Value Ref Range   Sodium 137 135 - 145 mmol/L   Potassium 3.5 3.5 - 5.1 mmol/L   Chloride 109 101 - 111 mmol/L   CO2 24 22 - 32 mmol/L   Glucose, Bld 94 65 - 99 mg/dL   BUN 12 6 - 20 mg/dL   Creatinine, Ser 1.06 (H) 0.44 - 1.00 mg/dL   Calcium 7.5 (L) 8.9 - 10.3 mg/dL   Total Protein 4.8 (L) 6.5 - 8.1 g/dL   Albumin 1.2 (L) 3.5 - 5.0 g/dL   AST 19 15 - 41 U/L   ALT 16 14 - 54 U/L   Alkaline Phosphatase 98 38 - 126 U/L   Total Bilirubin 0.2 (L) 0.3 - 1.2 mg/dL   GFR calc non Af Amer >60 >60 mL/min  GFR calc Af Amer >60 >60 mL/min    Comment: (NOTE) The eGFR has been calculated using the CKD EPI equation. This calculation has not been validated in all clinical situations. eGFR's persistently <60 mL/min signify  possible Chronic Kidney Disease.    Anion gap 4 (L) 5 - 15  CBC     Status: Abnormal   Collection Time: 05/16/15  4:16 AM  Result Value Ref Range   WBC 7.0 4.0 - 10.5 K/uL   RBC 2.68 (L) 3.87 - 5.11 MIL/uL   Hemoglobin 7.6 (L) 12.0 - 15.0 g/dL   HCT 23.5 (L) 36.0 - 46.0 %   MCV 87.7 78.0 - 100.0 fL   MCH 28.4 26.0 - 34.0 pg   MCHC 32.3 30.0 - 36.0 g/dL   RDW 15.2 11.5 - 15.5 %   Platelets 397 150 - 400 K/uL    ABGS No results for input(s): PHART, PO2ART, TCO2, HCO3 in the last 72 hours.  Invalid input(s): PCO2 CULTURES Recent Results (from the past 240 hour(s))  Culture, blood (routine x 2)     Status: None (Preliminary result)   Collection Time: 05/15/15 11:04 AM  Result Value Ref Range Status   Specimen Description BLOOD  Final   Special Requests NONE  Final   Culture NO GROWTH < 24 HOURS  Final   Report Status PENDING  Incomplete  Urine culture     Status: None (Preliminary result)   Collection Time: 05/15/15 11:40 AM  Result Value Ref Range Status   Specimen Description URINE, CATHETERIZED  Final   Special Requests NONE  Final   Culture   Final    TOO YOUNG TO READ Performed at [facility]    Report Status PENDING  Incomplete  Culture, blood (routine x 2)     Status: None (Preliminary result)   Collection Time: 05/15/15 11:47 AM  Result Value Ref Range Status   Specimen Description BLOOD  Final   Special Requests NONE  Final   Culture NO GROWTH < 24 HOURS  Final   Report Status PENDING  Incomplete  MRSA PCR Screening     Status: Abnormal   Collection Time: 05/15/15  5:30 PM  Result Value Ref Range Status   MRSA by PCR POSITIVE (A) NEGATIVE Final    Comment:        The GeneXpert MRSA Assay (FDA approved for NASAL specimens only), is one component of a comprehensive MRSA colonization surveillance program. It is not intended to diagnose MRSA infection nor to guide or monitor treatment for MRSA infections. RESULT CALLED TO, READ BACK BY AND  VERIFIED WITH: HAMMOCK S AT 0103 ON 122016 BY FORSYTH K    Studies/Results: Ct Head Wo Contrast  05/15/2015  CLINICAL DATA:  Altered mental status EXAM: CT HEAD WITHOUT CONTRAST TECHNIQUE: Contiguous axial images were obtained from the base of the skull through the vertex without intravenous contrast. COMPARISON:  11/18/2014 FINDINGS: No intracranial hemorrhage, mass effect or midline shift. No skull fracture is noted. Mild ventriculomegaly again noted. Stable chronic encephalomalacia left parietal lobe. No acute cortical infarction. Paranasal sinuses and mastoid air cells are unremarkable. No mass lesion is noted on this unenhanced scan. IMPRESSION: No acute intracranial abnormality. No definite acute cortical infarction. Ventriculomegaly again noted. Stable encephalomalacia in left parietal lobe high convexity. Electronically Signed   By: Lahoma Crocker M.D.   On: 05/15/2015 13:36   Ct Angio Chest Pe W/cm &/or Wo Cm  05/15/2015  CLINICAL DATA:  Shortness of breath  and tachycardia. Altered mental status EXAM: CT ANGIOGRAPHY CHEST WITH CONTRAST TECHNIQUE: Multidetector CT imaging of the chest was performed using the standard protocol during bolus administration of intravenous contrast. Multiplanar CT image reconstructions and MIPs were obtained to evaluate the vascular anatomy. CONTRAST:  144m OMNIPAQUE IOHEXOL 350 MG/ML SOLN COMPARISON:  Chest radiograph May 15, 2015 FINDINGS: There is no demonstrable pulmonary embolus. There is no appreciable thoracic aortic aneurysm or dissection. The visualized great vessels appear unremarkable. There are moderate pleural effusions bilaterally, larger on the right than on the left. There is atelectasis in the right lower lobe with a lesser degree of left lower lobe atelectatic change. Lungs elsewhere are clear. Thyroid appears unremarkable. There is no appreciable thoracic adenopathy. The pericardium is not thickened. In the visualized upper abdomen, there is  hepatic steatosis. There is rod fixation throughout much of the thoracic spine. The T7 vertebral body partially overlies the T8 vertebral body with antral listhesis and rightward displacement T8 with respect to T7. There is osteoarthritic change in the lower thoracic spine. There are no blastic or lytic bone lesions. Review of the MIP images confirms the above findings. IMPRESSION: No demonstrable pulmonary embolus. Bilateral pleural effusions, larger on the right than on the left with bibasilar atelectasis, more severe on the right than on the left. Superimposed pneumonia in the right base cannot be excluded. There is extensive thoracic spine fixation. Note that there is offset of the vertebral bodies at T7-T8. There is hepatic steatosis. Electronically Signed   By: WLowella GripIII M.D.   On: 05/15/2015 16:32   Dg Chest Port 1 View  05/15/2015  CLINICAL DATA:  Altered mental status.  Unable to communicate. EXAM: PORTABLE CHEST 1 VIEW COMPARISON:  05/10/2015 FINDINGS: There is no focal parenchymal opacity. There is no pleural effusion or pneumothorax. The heart and mediastinal contours are unremarkable. There is posterior spinal fusion hardware. IMPRESSION: No active disease. Electronically Signed   By: HKathreen Devoid  On: 05/15/2015 12:04    Medications: I have reviewed the patient's current medications.  Assesment:   Active Problems:   [place]   Suprapubic catheter (H[facility])   Seizure disorder (HCC)   Acute encephalopathy   Pressure ulcer ?UTI   Plan:  Medications reviewed Continue IV antibiotic Neurology consult appreciated Continue current treatment    LOS: 1 day   Keinan Brouillet 05/16/2015, 2:07 PM

## 2015-05-16 NOTE — Consult Note (Addendum)
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 Bilateral heels with previously healed full thickness pressure injuries (stage not known). She is wearing pressure redistribution heel boots to protect against further injury at this time and these came from home.  Sacral prophylactic foam dressing is in place over an intact sacrum. Wound type:Pressure, moisture plus friction Pressure Ulcer POA: Yes Measurement: Right IT 1.5cm x 1cm x 2.5cm with undermining to 2cm circumferentially.  Wound bed is pink, moist with light yellow exudate.  Left posterior thigh measures 3.5cm x 2cm x 1.2cm with no undermining.  Red, moist wound bed with moderate amount of light yellow exudate. Small areas of partial thickness tissue loss (largest measures 0.8cm x 1cm x 0.2cm) secondary to moisture plus friction. Wound bed:As described above. Drainage (amount, consistency, odor) As described above. Periwound: Intact, dry with evidence of previous wound healing (scarring). Dressing procedure/placement/frequency: I will discontinue NPWT at this time and instruct patient to notify healthcare provider [facility]) post discharge to pick up equipment. In acute care we will attempt to clean up the wounds by using a daily silver dressing (silver hydrofiber) for antimicrobial donation, absorbency and wicking.  This will be topped with a dry dressing and changed when wet. Eucerin Cream is provided  for extremities and moisture barrier cream for buttocks to prevent and treat MASD, specifically IAD. Patient is reminded to keep HOB at or below a 30-degree angle to reduce pressure on the ITs and to turn from side to side while in bed. She is reminded to limit sitting in her wheelchair to 2 hour intervals while she has an ulcer and to change position every 30 minutes.  She indicates understanding of teaching provided. A therapeutic mattress with low air loss feature is not recommended as patient is still able to turn and reposition and the ulcers are from sitting, not a supine or side-lying position. Orders are provided for the Nursing staff for skin and wound care. [place] nursing team will not follow, but will remain available to this patient, the nursing and medical teams.  Please re-consult if needed. Thanks, Maudie Flakes, MSN, RN, [place], Arther Abbott  Pager# [phone]

## 2015-05-17 LAB — CBC
HCT: 34.6 % — ABNORMAL LOW (ref 36.0–46.0)
Hemoglobin: 11.4 g/dL — ABNORMAL LOW (ref 12.0–15.0)
MCH: 27.9 pg (ref 26.0–34.0)
MCHC: 32.9 g/dL (ref 30.0–36.0)
MCV: 84.8 fL (ref 78.0–100.0)
Platelets: 354 10*3/uL (ref 150–400)
RBC: 4.08 MIL/uL (ref 3.87–5.11)
RDW: 14.7 % (ref 11.5–15.5)
WBC: 8.9 10*3/uL (ref 4.0–10.5)

## 2015-05-17 LAB — URINE CULTURE

## 2015-05-17 NOTE — Progress Notes (Signed)
Subjective: Patient is more alert and awake. Her mental status is improving. MRI is negative. EEG result pending. She is being followed by neurology.  Objective: Vital signs in last 24 hours: Temp:  [97 F (36.1 C)-98.1 F (36.7 C)] 98 F (36.7 C) (12/21 0805) Pulse Rate:  [50-89] 57 (12/21 0700) Resp:  [7-22] 8 (12/21 0700) BP: (97-166)/(59-104) 144/81 mmHg (12/21 0700) SpO2:  [96 %-100 %] 99 % (12/21 0700) Weight:  [73.1 kg (161 lb 2.5 oz)-74.39 kg (164 lb)] 73.1 kg (161 lb 2.5 oz) (12/21 0500) Weight change: 9 kg (19 lb 13.5 oz) Last BM Date: 05/15/15  Intake/Output from previous day: 12/20 0701 - 12/21 0700 In: 1193 [P.O.:240; I.V.:283; Blood:670] Out: 300 [Urine:300]  PHYSICAL EXAM General appearance: no distress and slowed mentation Resp: clear to auscultation bilaterally Cardio: S1, S2 normal GI: soft, non-tender; bowel sounds normal; no masses,  no organomegaly Extremities: paraplegic  Lab Results:  Results for orders placed or performed during the hospital encounter of 05/15/15 (from the past 48 hour(s))  CBG monitoring, ED     Status: None   Collection Time: 05/15/15 10:58 AM  Result Value Ref Range   Glucose-Capillary 94 65 - 99 mg/dL  Comprehensive metabolic panel     Status: Abnormal   Collection Time: 05/15/15 11:04 AM  Result Value Ref Range   Sodium 141 135 - 145 mmol/L   Potassium 3.8 3.5 - 5.1 mmol/L   Chloride 111 101 - 111 mmol/L   CO2 27 22 - 32 mmol/L   Glucose, Bld 108 (H) 65 - 99 mg/dL   BUN 12 6 - 20 mg/dL   Creatinine, Ser 1.27 (H) 0.44 - 1.00 mg/dL   Calcium 8.2 (L) 8.9 - 10.3 mg/dL   Total Protein 6.0 (L) 6.5 - 8.1 g/dL   Albumin 1.5 (L) 3.5 - 5.0 g/dL   AST 24 15 - 41 U/L   ALT 19 14 - 54 U/L   Alkaline Phosphatase 124 38 - 126 U/L   Total Bilirubin 0.4 0.3 - 1.2 mg/dL   GFR calc non Af Amer 57 (L) >60 mL/min   GFR calc Af Amer >60 >60 mL/min    Comment: (NOTE) The eGFR has been calculated using the CKD EPI equation. This  calculation has not been validated in all clinical situations. eGFR's persistently <60 mL/min signify possible Chronic Kidney Disease.    Anion gap 3 (L) 5 - 15  CBC     Status: Abnormal   Collection Time: 05/15/15 11:04 AM  Result Value Ref Range   WBC 9.9 4.0 - 10.5 K/uL   RBC 3.17 (L) 3.87 - 5.11 MIL/uL   Hemoglobin 8.9 (L) 12.0 - 15.0 g/dL   HCT 51.7 (L) 00.1 - 74.9 %   MCV 87.4 78.0 - 100.0 fL   MCH 28.1 26.0 - 34.0 pg   MCHC 32.1 30.0 - 36.0 g/dL   RDW 44.9 67.5 - 91.6 %   Platelets 490 (H) 150 - 400 K/uL  Culture, blood (routine x 2)     Status: None (Preliminary result)   Collection Time: 05/15/15 11:04 AM  Result Value Ref Range   Specimen Description BLOOD    Special Requests NONE    Culture NO GROWTH < 24 HOURS    Report Status PENDING   I-Stat CG4 Lactic Acid, ED (Not at [facility])     Status: None   Collection Time: 05/15/15 11:36 AM  Result Value Ref Range   Lactic Acid, Venous 1.61 0.5 -  2.0 mmol/L  Urinalysis, Routine w reflex microscopic (not at [facility])     Status: Abnormal   Collection Time: 05/15/15 11:40 AM  Result Value Ref Range   Color, Urine YELLOW YELLOW   APPearance CLEAR CLEAR   Specific Gravity, Urine 1.020 1.005 - 1.030   pH 7.0 5.0 - 8.0   Glucose, UA 100 (A) NEGATIVE mg/dL   Hgb urine dipstick SMALL (A) NEGATIVE   Bilirubin Urine NEGATIVE NEGATIVE   Ketones, ur NEGATIVE NEGATIVE mg/dL   Protein, ur >300 (A) NEGATIVE mg/dL   Nitrite NEGATIVE NEGATIVE   Leukocytes, UA NEGATIVE NEGATIVE  Urine culture     Status: None (Preliminary result)   Collection Time: 05/15/15 11:40 AM  Result Value Ref Range   Specimen Description URINE, CATHETERIZED    Special Requests NONE    Culture      TOO YOUNG TO READ Performed at [facility]    Report Status PENDING   Urine microscopic-add on     Status: Abnormal   Collection Time: 05/15/15 11:40 AM  Result Value Ref Range   Squamous Epithelial / LPF 6-30 (A) NONE SEEN   WBC, UA 0-5 0 - 5 WBC/hpf    RBC / HPF 0-5 0 - 5 RBC/hpf   Bacteria, UA MANY (A) NONE SEEN  Ammonia     Status: None   Collection Time: 05/15/15 11:42 AM  Result Value Ref Range   Ammonia 15 9 - 35 umol/L  Culture, blood (routine x 2)     Status: None (Preliminary result)   Collection Time: 05/15/15 11:47 AM  Result Value Ref Range   Specimen Description BLOOD    Special Requests NONE    Culture NO GROWTH < 24 HOURS    Report Status PENDING   I-Stat CG4 Lactic Acid, ED (Not at [facility])     Status: None   Collection Time: 05/15/15  2:36 PM  Result Value Ref Range   Lactic Acid, Venous 0.83 0.5 - 2.0 mmol/L  MRSA PCR Screening     Status: Abnormal   Collection Time: 05/15/15  5:30 PM  Result Value Ref Range   MRSA by PCR POSITIVE (A) NEGATIVE    Comment:        The GeneXpert MRSA Assay (FDA approved for NASAL specimens only), is one component of a comprehensive MRSA colonization surveillance program. It is not intended to diagnose MRSA infection nor to guide or monitor treatment for MRSA infections. RESULT CALLED TO, READ BACK BY AND VERIFIED WITH: HAMMOCK S AT 0103 ON 122016 BY FORSYTH K   Comprehensive metabolic panel     Status: Abnormal   Collection Time: 05/16/15  4:16 AM  Result Value Ref Range   Sodium 137 135 - 145 mmol/L   Potassium 3.5 3.5 - 5.1 mmol/L   Chloride 109 101 - 111 mmol/L   CO2 24 22 - 32 mmol/L   Glucose, Bld 94 65 - 99 mg/dL   BUN 12 6 - 20 mg/dL   Creatinine, Ser 1.06 (H) 0.44 - 1.00 mg/dL   Calcium 7.5 (L) 8.9 - 10.3 mg/dL   Total Protein 4.8 (L) 6.5 - 8.1 g/dL   Albumin 1.2 (L) 3.5 - 5.0 g/dL   AST 19 15 - 41 U/L   ALT 16 14 - 54 U/L   Alkaline Phosphatase 98 38 - 126 U/L   Total Bilirubin 0.2 (L) 0.3 - 1.2 mg/dL   GFR calc non Af Amer >60 >60 mL/min   GFR calc  Af Amer >60 >60 mL/min    Comment: (NOTE) The eGFR has been calculated using the CKD EPI equation. This calculation has not been validated in all clinical situations. eGFR's persistently <60 mL/min signify  possible Chronic Kidney Disease.    Anion gap 4 (L) 5 - 15  CBC     Status: Abnormal   Collection Time: 05/16/15  4:16 AM  Result Value Ref Range   WBC 7.0 4.0 - 10.5 K/uL   RBC 2.68 (L) 3.87 - 5.11 MIL/uL   Hemoglobin 7.6 (L) 12.0 - 15.0 g/dL   HCT 79.3 (L) 96.8 - 86.4 %   MCV 87.7 78.0 - 100.0 fL   MCH 28.4 26.0 - 34.0 pg   MCHC 32.3 30.0 - 36.0 g/dL   RDW 84.7 20.7 - 21.8 %   Platelets 397 150 - 400 K/uL  Vitamin B12     Status: None   Collection Time: 05/16/15  3:30 PM  Result Value Ref Range   Vitamin B-12 517 180 - 914 pg/mL    Comment: (NOTE) This assay is not validated for testing neonatal or myeloproliferative syndrome specimens for Vitamin B12 levels. Performed at [facility]   Folate     Status: None   Collection Time: 05/16/15  3:30 PM  Result Value Ref Range   Folate 39.5 >5.9 ng/mL    Comment: Performed at [facility]  Iron and TIBC     Status: Abnormal   Collection Time: 05/16/15  3:30 PM  Result Value Ref Range   Iron 25 (L) 28 - 170 ug/dL   TIBC 288 (L) 337 - 445 ug/dL   Saturation Ratios 19 10.4 - 31.8 %   UIBC 108 ug/dL    Comment: Performed at [facility]  Ferritin     Status: None   Collection Time: 05/16/15  3:30 PM  Result Value Ref Range   Ferritin 46 11 - 307 ng/mL    Comment: Performed at [facility]  Reticulocytes     Status: Abnormal   Collection Time: 05/16/15  3:30 PM  Result Value Ref Range   Retic Ct Pct 2.6 0.4 - 3.1 %   RBC. 2.82 (L) 3.87 - 5.11 MIL/uL   Retic Count, Manual 73.3 19.0 - 186.0 K/uL  Prepare RBC     Status: None   Collection Time: 05/16/15  3:30 PM  Result Value Ref Range   Order Confirmation ORDER PROCESSED BY BLOOD BANK   Type and screen [facility]     Status: None (Preliminary result)   Collection Time: 05/16/15  3:30 PM  Result Value Ref Range   ABO/RH(D) A POS    Antibody Screen NEG    Sample Expiration 05/19/2015    Unit Number [id number]    Blood Component  Type RED CELLS,LR    Unit division 00    Status of Unit ISSUED    Transfusion Status OK TO TRANSFUSE    Crossmatch Result Compatible    Unit Number [id number]    Blood Component Type RED CELLS,LR    Unit division 00    Status of Unit ISSUED    Transfusion Status OK TO TRANSFUSE    Crossmatch Result Compatible   ABO/Rh     Status: None   Collection Time: 05/16/15  3:30 PM  Result Value Ref Range   ABO/RH(D) A POS   CBC     Status: Abnormal   Collection Time: 05/17/15  5:30 AM  Result Value Ref  Range   WBC 8.9 4.0 - 10.5 K/uL   RBC 4.08 3.87 - 5.11 MIL/uL   Hemoglobin 11.4 (L) 12.0 - 15.0 g/dL    Comment: DELTA CHECK NOTED   HCT 34.6 (L) 36.0 - 46.0 %   MCV 84.8 78.0 - 100.0 fL   MCH 27.9 26.0 - 34.0 pg   MCHC 32.9 30.0 - 36.0 g/dL   RDW 14.7 11.5 - 15.5 %   Platelets 354 150 - 400 K/uL    ABGS No results for input(s): PHART, PO2ART, TCO2, HCO3 in the last 72 hours.  Invalid input(s): PCO2 CULTURES Recent Results (from the past 240 hour(s))  Culture, blood (routine x 2)     Status: None (Preliminary result)   Collection Time: 05/15/15 11:04 AM  Result Value Ref Range Status   Specimen Description BLOOD  Final   Special Requests NONE  Final   Culture NO GROWTH < 24 HOURS  Final   Report Status PENDING  Incomplete  Urine culture     Status: None (Preliminary result)   Collection Time: 05/15/15 11:40 AM  Result Value Ref Range Status   Specimen Description URINE, CATHETERIZED  Final   Special Requests NONE  Final   Culture   Final    TOO YOUNG TO READ Performed at [facility]    Report Status PENDING  Incomplete  Culture, blood (routine x 2)     Status: None (Preliminary result)   Collection Time: 05/15/15 11:47 AM  Result Value Ref Range Status   Specimen Description BLOOD  Final   Special Requests NONE  Final   Culture NO GROWTH < 24 HOURS  Final   Report Status PENDING  Incomplete  MRSA PCR Screening     Status: Abnormal   Collection Time:  05/15/15  5:30 PM  Result Value Ref Range Status   MRSA by PCR POSITIVE (A) NEGATIVE Final    Comment:        The GeneXpert MRSA Assay (FDA approved for NASAL specimens only), is one component of a comprehensive MRSA colonization surveillance program. It is not intended to diagnose MRSA infection nor to guide or monitor treatment for MRSA infections. RESULT CALLED TO, READ BACK BY AND VERIFIED WITH: HAMMOCK S AT 0103 ON 122016 BY FORSYTH K    Studies/Results: Ct Head Wo Contrast  05/15/2015  CLINICAL DATA:  Altered mental status EXAM: CT HEAD WITHOUT CONTRAST TECHNIQUE: Contiguous axial images were obtained from the base of the skull through the vertex without intravenous contrast. COMPARISON:  11/18/2014 FINDINGS: No intracranial hemorrhage, mass effect or midline shift. No skull fracture is noted. Mild ventriculomegaly again noted. Stable chronic encephalomalacia left parietal lobe. No acute cortical infarction. Paranasal sinuses and mastoid air cells are unremarkable. No mass lesion is noted on this unenhanced scan. IMPRESSION: No acute intracranial abnormality. No definite acute cortical infarction. Ventriculomegaly again noted. Stable encephalomalacia in left parietal lobe high convexity. Electronically Signed   By: Lahoma Crocker M.D.   On: 05/15/2015 13:36   Ct Angio Chest Pe W/cm &/or Wo Cm  05/15/2015  CLINICAL DATA:  Shortness of breath and tachycardia. Altered mental status EXAM: CT ANGIOGRAPHY CHEST WITH CONTRAST TECHNIQUE: Multidetector CT imaging of the chest was performed using the standard protocol during bolus administration of intravenous contrast. Multiplanar CT image reconstructions and MIPs were obtained to evaluate the vascular anatomy. CONTRAST:  159m OMNIPAQUE IOHEXOL 350 MG/ML SOLN COMPARISON:  Chest radiograph May 15, 2015 FINDINGS: There is no demonstrable pulmonary embolus. There  is no appreciable thoracic aortic aneurysm or dissection. The visualized great  vessels appear unremarkable. There are moderate pleural effusions bilaterally, larger on the right than on the left. There is atelectasis in the right lower lobe with a lesser degree of left lower lobe atelectatic change. Lungs elsewhere are clear. Thyroid appears unremarkable. There is no appreciable thoracic adenopathy. The pericardium is not thickened. In the visualized upper abdomen, there is hepatic steatosis. There is rod fixation throughout much of the thoracic spine. The T7 vertebral body partially overlies the T8 vertebral body with antral listhesis and rightward displacement T8 with respect to T7. There is osteoarthritic change in the lower thoracic spine. There are no blastic or lytic bone lesions. Review of the MIP images confirms the above findings. IMPRESSION: No demonstrable pulmonary embolus. Bilateral pleural effusions, larger on the right than on the left with bibasilar atelectasis, more severe on the right than on the left. Superimposed pneumonia in the right base cannot be excluded. There is extensive thoracic spine fixation. Note that there is offset of the vertebral bodies at T7-T8. There is hepatic steatosis. Electronically Signed   By: Lowella Grip III M.D.   On: 05/15/2015 16:32   Mr Jeri Cos UD Contrast  05/16/2015  CLINICAL DATA:  Confusion and seizure disorder.  Uterine cancer. EXAM: MRI HEAD WITHOUT AND WITH CONTRAST TECHNIQUE: Multiplanar, multiecho pulse sequences of the brain and surrounding structures were obtained without and with intravenous contrast. CONTRAST:  4m MULTIHANCE GADOBENATE DIMEGLUMINE 529 MG/ML IV SOLN COMPARISON:  10/04/2014 FINDINGS: Calvarium and upper cervical spine: No focal marrow signal abnormality. Orbits: No significant findings. Sinuses and Mastoids: Clear. Brain: The asymmetric prominent diffusion hyperintensity in the right frontal cortex that is attributed to artifact as there is no swelling on FLAIR or enhancement as seen with seizure  phenomenon or cerebritis. There is no abnormal intracranial enhancement to suggest metastatic disease. Chronic cortical and subcortical left frontal gliosis at the vertex posteriorly, which could serve as seizure focus. Unchanged ventriculomegaly of the lateral ventricles with marked thinning of the corpus callosum body. Question if this is posttraumatic as there is hemosiderin staining in this region on gradient imaging. The septum pellucidum appears partially deficient and underlying congenital malformation is not excluded. Stable mild nonspecific signal abnormality around the lateral ventricles. IMPRESSION: 1. No acute finding or change from studies earlier this year. No evidence of metastatic disease. 2. Chronic changes are described above. Electronically Signed   By: JMonte FantasiaM.D.   On: 05/16/2015 21:46   Dg Chest Port 1 View  05/15/2015  CLINICAL DATA:  Altered mental status.  Unable to communicate. EXAM: PORTABLE CHEST 1 VIEW COMPARISON:  05/10/2015 FINDINGS: There is no focal parenchymal opacity. There is no pleural effusion or pneumothorax. The heart and mediastinal contours are unremarkable. There is posterior spinal fusion hardware. IMPRESSION: No active disease. Electronically Signed   By: HKathreen Devoid  On: 05/15/2015 12:04    Medications: I have reviewed the patient's current medications.  Assesment:   Active Problems:   Paraplegia ([place]   Suprapubic catheter (HCC)   Seizure disorder (HCC)   Acute encephalopathy   Pressure ulcer ?UTI   Plan:  Medications reviewed Continue IV antibiotic Continue wound care Continue current treatment    LOS: 2 days   Ottie Neglia 05/17/2015, 8:31 AM

## 2015-05-17 NOTE — Procedures (Signed)
Christina A. Merlene Laughter, MD     [email or web address]           HISTORY: The patient presents with altered mental status and confusion. His baseline history of seizures.  MEDICATIONS: Scheduled Meds: . sodium chloride   Intravenous Once  . amLODipine  10 mg Oral Daily  . baclofen  10 mg Oral QID  . bisacodyl  10 mg Rectal Daily  . Chlorhexidine Gluconate Cloth  6 each Topical Q0600  . cloNIDine  0.2 mg Oral TID  . desmopressin  200 mcg Oral QHS  . docusate sodium  100 mg Oral QHS  . enoxaparin (LOVENOX) injection  40 mg Subcutaneous Q24H  . furosemide  20 mg Oral Daily  . gabapentin  300 mg Oral TID  . hydrocerin  1 application Topical Daily  . lacosamide  100 mg Oral BID  . levETIRAcetam  1,500 mg Oral BID  . levofloxacin (LEVAQUIN) IV  500 mg Intravenous Q24H  . metoprolol tartrate  25 mg Oral BID  . mupirocin ointment  1 application Nasal BID  . pantoprazole  40 mg Oral BID  . potassium chloride SA  20 mEq Oral Daily  . sodium chloride  3 mL Intravenous Q12H  . vitamin C  500 mg Oral Daily  . zinc sulfate  220 mg Oral Daily   Continuous Infusions: . sodium chloride 50 mL/hr at 05/17/15 0600   PRN Meds:.ondansetron **OR** ondansetron (ZOFRAN) IV  Prior to Admission medications   Medication Sig Start Date End Date Taking? Authorizing Provider  amLODipine (NORVASC) 10 MG tablet Take 1 tablet (10 mg total) by mouth daily. 05/10/14  Yes Donne Hazel, MD  azithromycin (ZITHROMAX Z-PAK) 250 MG tablet Take 1 tablet (250 mg total) by mouth daily. 500mg  PO day 1, then 250mg  PO days 205 05/10/15  Yes Noemi Chapel, MD  baclofen (LIORESAL) 20 MG tablet Take 1 tablet (20 mg total) by mouth 4 (four) times daily. Patient taking differently: Take 10 mg by mouth 4 (four) times daily.  06/14/14  Yes Melvenia Beam, MD  cloNIDine (CATAPRES) 0.2 MG tablet Take 0.2 mg by mouth 3 (three) times daily.    Yes Historical Provider, MD  desmopressin (DDAVP) 0.2 MG tablet Take  200 mcg by mouth at bedtime. 04/19/15  Yes Historical Provider, MD  docusate sodium (COLACE) 100 MG capsule Take 100 mg by mouth at bedtime.   Yes Historical Provider, MD  furosemide (LASIX) 20 MG tablet Take 20 mg by mouth daily.   Yes Historical Provider, MD  gabapentin (NEURONTIN) 300 MG capsule Take 300 mg by mouth 3 (three) times daily.    Yes Historical Provider, MD  levETIRAcetam (KEPPRA) 500 MG tablet Take 3 tablets (1,500 mg total) by mouth 2 (two) times daily. 10/19/14  Yes [name], MD  metoprolol tartrate (LOPRESSOR) 25 MG tablet Take 1 tablet (25 mg total) by mouth 2 (two) times daily. 05/10/14  Yes Donne Hazel, MD  Multiple Vitamins-Minerals (MULTIVITAMIN ADULT PO) Take 1 tablet by mouth daily.    Yes Historical Provider, MD  pantoprazole (PROTONIX) 40 MG tablet Take 1 tablet (40 mg total) by mouth 2 (two) times daily. 04/12/15  Yes Jola Schmidt, MD  potassium chloride SA (K-DUR,KLOR-CON) 20 MEQ tablet Take 20 mEq by mouth daily.    Yes Historical Provider, MD  vitamin C (ASCORBIC ACID) 500 MG tablet Take 500 mg by mouth daily.   Yes Historical Provider, MD  zinc gluconate 50 MG tablet Take 50  mg by mouth daily.   Yes Historical Provider, MD      ANALYSIS: A 16 channel recording using standard 10 20 measurements is conducted for 20 minutes. The background activity gets as high as 7 Hz. There is a beta activity observing frontal areas. Awake and drowsy activities are observed. Photic stimulation and hyperventilation were not carried out. There is no focal or lateral slowing. There is no epileptiform activity observed.   IMPRESSION: This recording of the awake and drowsy states shows mild global slowing otherwise unrevealing.      [name] A. Merlene [name], M.D.  Diplomate, Tax adviser of Psychiatry and Neurology ( Neurology).

## 2015-05-17 NOTE — Progress Notes (Signed)
Patient ID: [name], female   DOB: June 16, 1965, 49 y.o.   MRN: [id number]    [place] A. Merlene Laughter, MD     [email or web address]          [name] is an 49 y.o. female.   Assessment/Plan:  The patient is much improved and essentially is at baseline. Etiology of the patient's encephalopathy seems most consistent with a unwitnessed seizure. She does have some evidence of bacturia but not clearly positive for UTI.     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  Traumatic paraplegia.    RECOMMENDATION:  Increase [name] 200 mg twice a day. Continue with Keppra 1500 mg twice a day.    HEENT: Supple. Atraumatic normocephalic.   ABDOMEN: soft  EXTREMITIES: No edema   BACK: Normal.  SKIN: Normal by inspection.   MENTAL STATUS: She is awake and the lucid. She converses well.  CRANIAL NERVES: Pupils are quite large about 7 mm and sluggishly reactive; extra ocular movements are full, there is no significant nystagmus; visual fields - limited due to the cognition appears to be full; there appears to be mild flattening of the nasolabial fold on the right side; otherwise facial muscles are symmetric; tongue midline.  MOTOR: Upper extremity strength graded as 3/5. Leg 0.  COORDINATION: There is no dysmetria or tremors appreciated.     Objective: Vital signs in last 24 hours: Temp:  [96.7 F (35.9 C)-98 F (36.7 C)] 96.7 F (35.9 C) (12/21 1700) Pulse Rate:  [50-81] 74 (12/21 1700) Resp:  [7-15] 15 (12/21 1700) BP: (97-163)/(59-95) 136/86 mmHg (12/21 1700) SpO2:  [96 %-100 %] 98 % (12/21 1700) Weight:  [73.1 kg (161 lb 2.5 oz)-74.39 kg (164 lb)] 73.1 kg (161 lb 2.5 oz) (12/21 0500)  Intake/Output from previous day: 12/20 0701 - 12/21 0700 In: 1193 [P.O.:240; I.V.:283; Blood:670] Out: 300 [Urine:300] Intake/Output this shift: Total I/O In: 2090 [P.O.:1440; I.V.:550; IV  Piggyback:100] Out: 1050 [Urine:1050] Nutritional status: Diet clear liquid Room service appropriate?: Yes; Fluid consistency:: Thin   Lab Results: Results for orders placed or performed during the hospital encounter of 05/15/15 (from the past 48 hour(s))  Comprehensive metabolic panel     Status: Abnormal   Collection Time: 05/16/15  4:16 AM  Result Value Ref Range   Sodium 137 135 - 145 mmol/L   Potassium 3.5 3.5 - 5.1 mmol/L   Chloride 109 101 - 111 mmol/L   CO2 24 22 - 32 mmol/L   Glucose, Bld 94 65 - 99 mg/dL   BUN 12 6 - 20 mg/dL   Creatinine, Ser 1.06 (H) 0.44 - 1.00 mg/dL   Calcium 7.5 (L) 8.9 - 10.3 mg/dL   Total Protein 4.8 (L) 6.5 - 8.1 g/dL   Albumin 1.2 (L) 3.5 - 5.0 g/dL   AST 19 15 - 41 U/L   ALT 16 14 - 54 U/L   Alkaline Phosphatase 98 38 - 126 U/L   Total Bilirubin 0.2 (L) 0.3 - 1.2 mg/dL   GFR calc non Af Amer >60 >60 mL/min   GFR calc Af Amer >60 >60 mL/min    Comment: (NOTE) The eGFR has been calculated using the CKD EPI equation. This calculation has not been validated in all clinical situations. eGFR's persistently <60 mL/min signify possible Chronic Kidney Disease.    Anion gap 4 (L) 5 - 15  CBC     Status: Abnormal   Collection Time: 05/16/15  4:16 AM  Result Value Ref Range   WBC 7.0 4.0 - 10.5 K/uL   RBC 2.68 (L) 3.87 - 5.11 MIL/uL   Hemoglobin 7.6 (L) 12.0 - 15.0 g/dL   HCT 23.5 (L) 36.0 - 46.0 %   MCV 87.7 78.0 - 100.0 fL   MCH 28.4 26.0 - 34.0 pg   MCHC 32.3 30.0 - 36.0 g/dL   RDW 15.2 11.5 - 15.5 %   Platelets 397 150 - 400 K/uL  Vitamin B12     Status: None   Collection Time: 05/16/15  3:30 PM  Result Value Ref Range   Vitamin B-12 517 180 - 914 pg/mL    Comment: (NOTE) This assay is not validated for testing neonatal or myeloproliferative syndrome specimens for Vitamin B12 levels. Performed at [facility]   Folate     Status: None   Collection Time: 05/16/15  3:30 PM  Result Value Ref Range   Folate 39.5 >5.9 ng/mL     Comment: Performed at [facility]  Iron and TIBC     Status: Abnormal   Collection Time: 05/16/15  3:30 PM  Result Value Ref Range   Iron 25 (L) 28 - 170 ug/dL   TIBC 133 (L) 250 - 450 ug/dL   Saturation Ratios 19 10.4 - 31.8 %   UIBC 108 ug/dL    Comment: Performed at [facility]  Ferritin     Status: None   Collection Time: 05/16/15  3:30 PM  Result Value Ref Range   Ferritin 46 11 - 307 ng/mL    Comment: Performed at [facility]  Reticulocytes     Status: Abnormal   Collection Time: 05/16/15  3:30 PM  Result Value Ref Range   Retic Ct Pct 2.6 0.4 - 3.1 %   RBC. 2.82 (L) 3.87 - 5.11 MIL/uL   Retic Count, Manual 73.3 19.0 - 186.0 K/uL  Prepare RBC     Status: None   Collection Time: 05/16/15  3:30 PM  Result Value Ref Range   Order Confirmation ORDER PROCESSED BY BLOOD BANK   Type and screen [facility]     Status: None (Preliminary result)   Collection Time: 05/16/15  3:30 PM  Result Value Ref Range   ABO/RH(D) A POS    Antibody Screen NEG    Sample Expiration 05/19/2015    Unit Number [id number]    Blood Component Type RED CELLS,LR    Unit division 00    Status of Unit ISSUED    Transfusion Status OK TO TRANSFUSE    Crossmatch Result Compatible    Unit Number [id number]    Blood Component Type RED CELLS,LR    Unit division 00    Status of Unit ISSUED,FINAL    Transfusion Status OK TO TRANSFUSE    Crossmatch Result Compatible   ABO/Rh     Status: None   Collection Time: 05/16/15  3:30 PM  Result Value Ref Range   ABO/RH(D) A POS   CBC     Status: Abnormal   Collection Time: 05/17/15  5:30 AM  Result Value Ref Range   WBC 8.9 4.0 - 10.5 K/uL   RBC 4.08 3.87 - 5.11 MIL/uL   Hemoglobin 11.4 (L) 12.0 - 15.0 g/dL    Comment: DELTA CHECK NOTED   HCT 34.6 (L) 36.0 - 46.0 %   MCV 84.8 78.0 - 100.0 fL   MCH 27.9 26.0 - 34.0 pg   MCHC 32.9 30.0 - 36.0 g/dL  RDW 14.7 11.5 - 15.5 %   Platelets 354 150 - 400 K/uL    Lipid  Panel No results for input(s): CHOL, TRIG, HDL, CHOLHDL, VLDL, LDLCALC in the last 72 hours.  Studies/Results:   Medications:  Scheduled Meds: . sodium chloride   Intravenous Once  . amLODipine  10 mg Oral Daily  . baclofen  10 mg Oral QID  . bisacodyl  10 mg Rectal Daily  . Chlorhexidine Gluconate Cloth  6 each Topical Q0600  . cloNIDine  0.2 mg Oral TID  . desmopressin  200 mcg Oral QHS  . docusate sodium  100 mg Oral QHS  . enoxaparin (LOVENOX) injection  40 mg Subcutaneous Q24H  . furosemide  20 mg Oral Daily  . gabapentin  300 mg Oral TID  . hydrocerin  1 application Topical Daily  . lacosamide  100 mg Oral BID  . levETIRAcetam  1,500 mg Oral BID  . levofloxacin (LEVAQUIN) IV  500 mg Intravenous Q24H  . metoprolol tartrate  25 mg Oral BID  . mupirocin ointment  1 application Nasal BID  . pantoprazole  40 mg Oral BID  . potassium chloride SA  20 mEq Oral Daily  . sodium chloride  3 mL Intravenous Q12H  . vitamin C  500 mg Oral Daily  . zinc sulfate  220 mg Oral Daily   Continuous Infusions: . sodium chloride 50 mL/hr at 05/17/15 0600   PRN Meds:.ondansetron **OR** ondansetron (ZOFRAN) IV     LOS: 2 days   Teandra Harlan A. Merlene Laughter, M.D.  Diplomate, Tax adviser of Psychiatry and Neurology ( Neurology).

## 2015-05-17 NOTE — Care Management Note (Signed)
Case Management Note  Patient Details  Name: [name] MRN: [id number] Date of Birth: March 06, 1966  Subjective/Objective:                  Admitted with encephalopathy. Pt is from home, lives with her daughter (son lives next door). Pt is wheelchair bound. Pt has hoyer lift, HB and manual WC. Pt uses wound vac. Pt has brought her wound vac from home, not currently on, awaiting wound consult. Pt has aid 7 days a week.  Action/Plan: Pt plans to return home with her daughter. Will resume [place] services at DC. Romualdo Bolk, of [facility], made aware of admission and DC plan. Will cont to follow for DC planning needs.   Expected Discharge Date:     05/10/2015             Expected Discharge Plan:  [place]  In-House Referral:  NA  Discharge planning Services  CM Consult  Post Acute Care Choice:  Resumption of Svcs/PTA Provider Choice offered to:  Patient  DME Arranged:    DME Agency:     HH Arranged:  RN [place] Agency:  [place]  Status of Service:  In process, will continue to follow  Medicare Important Message Given:    Date Medicare IM Given:    Medicare IM give by:    Date Additional Medicare IM Given:    Additional Medicare Important Message give by:     If discussed at [place] of Stay Meetings, dates discussed:    Additional Comments:  Sherald Barge, RN 05/17/2015, 2:23 PM

## 2015-05-17 NOTE — Progress Notes (Signed)
MRI completed during PM shift w no obvious or stated distress.  Pt returned to ICU and all monitors applied and working.  Pt is in no obvious or stated distress.

## 2015-05-18 LAB — CBC
HEMATOCRIT: 32.9 % — AB (ref 36.0–46.0)
HEMOGLOBIN: 10.9 g/dL — AB (ref 12.0–15.0)
MCH: 27.9 pg (ref 26.0–34.0)
MCHC: 33.1 g/dL (ref 30.0–36.0)
MCV: 84.1 fL (ref 78.0–100.0)
Platelets: 322 10*3/uL (ref 150–400)
RBC: 3.91 MIL/uL (ref 3.87–5.11)
RDW: 15.1 % (ref 11.5–15.5)
WBC: 9.9 10*3/uL (ref 4.0–10.5)

## 2015-05-18 LAB — TYPE AND SCREEN
ABO/RH(D): A POS
ANTIBODY SCREEN: NEGATIVE
Unit division: 0
Unit division: 0

## 2015-05-18 NOTE — Progress Notes (Signed)
Subjective: Patient is more alert and awake. Her p intake is improving. .  Objective: Vital signs in last 24 hours: Temp:  [96.7 F (35.9 C)-97.3 F (36.3 C)] 97.2 F (36.2 C) (12/22 0400) Pulse Rate:  [52-95] 54 (12/22 0700) Resp:  [0-25] 0 (12/22 0700) BP: (101-153)/(67-92) 101/67 mmHg (12/22 0700) SpO2:  [95 %-99 %] 97 % (12/22 0700) FiO2 (%):  [28 %] 28 % (12/22 0754) Weight:  [70.1 kg (154 lb 8.7 oz)] 70.1 kg (154 lb 8.7 oz) (12/22 0500) Weight change: -3 kg (-6 lb 9.8 oz) Last BM Date: 05/15/15  Intake/Output from previous day: 12/21 0701 - 12/22 0700 In: 2493 [P.O.:1440; I.V.:953; IV Piggyback:100] Out: 1900 [Urine:1900]  PHYSICAL EXAM General appearance: no distress and slowed mentation Resp: clear to auscultation bilaterally Cardio: S1, S2 normal GI: soft, non-tender; bowel sounds normal; no masses,  no organomegaly Extremities: paraplegic  Lab Results:  Results for orders placed or performed during the hospital encounter of 05/15/15 (from the past 48 hour(s))  Vitamin B12     Status: None   Collection Time: 05/16/15  3:30 PM  Result Value Ref Range   Vitamin B-12 517 180 - 914 pg/mL    Comment: (NOTE) This assay is not validated for testing neonatal or myeloproliferative syndrome specimens for Vitamin B12 levels. Performed at [facility]   Folate     Status: None   Collection Time: 05/16/15  3:30 PM  Result Value Ref Range   Folate 39.5 >5.9 ng/mL    Comment: Performed at [facility]  Iron and TIBC     Status: Abnormal   Collection Time: 05/16/15  3:30 PM  Result Value Ref Range   Iron 25 (L) 28 - 170 ug/dL   TIBC 133 (L) 250 - 450 ug/dL   Saturation Ratios 19 10.4 - 31.8 %   UIBC 108 ug/dL    Comment: Performed at [facility]  Ferritin     Status: None   Collection Time: 05/16/15  3:30 PM  Result Value Ref Range   Ferritin 46 11 - 307 ng/mL    Comment: Performed at [facility]  Reticulocytes     Status: Abnormal   Collection Time: 05/16/15  3:30 PM  Result Value Ref Range   Retic Ct Pct 2.6 0.4 - 3.1 %   RBC. 2.82 (L) 3.87 - 5.11 MIL/uL   Retic Count, Manual 73.3 19.0 - 186.0 K/uL  Prepare RBC     Status: None   Collection Time: 05/16/15  3:30 PM  Result Value Ref Range   Order Confirmation ORDER PROCESSED BY BLOOD BANK   Type and screen [facility]     Status: None   Collection Time: 05/16/15  3:30 PM  Result Value Ref Range   ABO/RH(D) A POS    Antibody Screen NEG    Sample Expiration 05/19/2015    Unit Number [id number]    Blood Component Type RED CELLS,LR    Unit division 00    Status of Unit ISSUED,FINAL    Transfusion Status OK TO TRANSFUSE    Crossmatch Result Compatible    Unit Number [id number]    Blood Component Type RED CELLS,LR    Unit division 00    Status of Unit ISSUED,FINAL    Transfusion Status OK TO TRANSFUSE    Crossmatch Result Compatible   ABO/Rh     Status: None   Collection Time: 05/16/15  3:30 PM  Result Value Ref Range  ABO/RH(D) A POS   CBC     Status: Abnormal   Collection Time: 05/17/15  5:30 AM  Result Value Ref Range   WBC 8.9 4.0 - 10.5 K/uL   RBC 4.08 3.87 - 5.11 MIL/uL   Hemoglobin 11.4 (L) 12.0 - 15.0 g/dL    Comment: DELTA CHECK NOTED   HCT 34.6 (L) 36.0 - 46.0 %   MCV 84.8 78.0 - 100.0 fL   MCH 27.9 26.0 - 34.0 pg   MCHC 32.9 30.0 - 36.0 g/dL   RDW 14.7 11.5 - 15.5 %   Platelets 354 150 - 400 K/uL  CBC     Status: Abnormal   Collection Time: 05/18/15  5:45 AM  Result Value Ref Range   WBC 9.9 4.0 - 10.5 K/uL   RBC 3.91 3.87 - 5.11 MIL/uL   Hemoglobin 10.9 (L) 12.0 - 15.0 g/dL   HCT 32.9 (L) 36.0 - 46.0 %   MCV 84.1 78.0 - 100.0 fL   MCH 27.9 26.0 - 34.0 pg   MCHC 33.1 30.0 - 36.0 g/dL   RDW 15.1 11.5 - 15.5 %   Platelets 322 150 - 400 K/uL    ABGS No results for input(s): PHART, PO2ART, TCO2, HCO3 in the last 72 hours.  Invalid input(s): PCO2 CULTURES Recent Results (from the past 240 hour(s))  Culture, blood  (routine x 2)     Status: None (Preliminary result)   Collection Time: 05/15/15 11:04 AM  Result Value Ref Range Status   Specimen Description BLOOD  Final   Special Requests NONE  Final   Culture NO GROWTH 2 DAYS  Final   Report Status PENDING  Incomplete  Urine culture     Status: None   Collection Time: 05/15/15 11:40 AM  Result Value Ref Range Status   Specimen Description URINE, CATHETERIZED  Final   Special Requests NONE  Final   Culture   Final    MULTIPLE SPECIES PRESENT, SUGGEST RECOLLECTION Performed at [facility]    Report Status 05/17/2015 FINAL  Final  Culture, blood (routine x 2)     Status: None (Preliminary result)   Collection Time: 05/15/15 11:47 AM  Result Value Ref Range Status   Specimen Description BLOOD  Final   Special Requests NONE  Final   Culture NO GROWTH 2 DAYS  Final   Report Status PENDING  Incomplete  MRSA PCR Screening     Status: Abnormal   Collection Time: 05/15/15  5:30 PM  Result Value Ref Range Status   MRSA by PCR POSITIVE (A) NEGATIVE Final    Comment:        The GeneXpert MRSA Assay (FDA approved for NASAL specimens only), is one component of a comprehensive MRSA colonization surveillance program. It is not intended to diagnose MRSA infection nor to guide or monitor treatment for MRSA infections. RESULT CALLED TO, READ BACK BY AND VERIFIED WITH: HAMMOCK S AT 0103 ON 122016 BY FORSYTH K    Studies/Results: Mr Jeri Cos Wo Contrast  05/16/2015  CLINICAL DATA:  Confusion and seizure disorder.  Uterine cancer. EXAM: MRI HEAD WITHOUT AND WITH CONTRAST TECHNIQUE: Multiplanar, multiecho pulse sequences of the brain and surrounding structures were obtained without and with intravenous contrast. CONTRAST:  29mL MULTIHANCE GADOBENATE DIMEGLUMINE 529 MG/ML IV SOLN COMPARISON:  10/04/2014 FINDINGS: Calvarium and upper cervical spine: No focal marrow signal abnormality. Orbits: No significant findings. Sinuses and Mastoids: Clear. Brain:  The asymmetric prominent diffusion hyperintensity in the right frontal cortex  that is attributed to artifact as there is no swelling on FLAIR or enhancement as seen with seizure phenomenon or cerebritis. There is no abnormal intracranial enhancement to suggest metastatic disease. Chronic cortical and subcortical left frontal gliosis at the vertex posteriorly, which could serve as seizure focus. Unchanged ventriculomegaly of the lateral ventricles with marked thinning of the corpus callosum body. Question if this is posttraumatic as there is hemosiderin staining in this region on gradient imaging. The septum pellucidum appears partially deficient and underlying congenital malformation is not excluded. Stable mild nonspecific signal abnormality around the lateral ventricles. IMPRESSION: 1. No acute finding or change from studies earlier this year. No evidence of metastatic disease. 2. Chronic changes are described above. Electronically Signed   By: Monte Fantasia M.D.   On: 05/16/2015 21:46    Medications: I have reviewed the patient's current medications.  Assesment:   Active Problems:   Paraplegia ([place])   Suprapubic catheter (HCC)   Seizure disorder (HCC)   Acute encephalopathy   Pressure ulcer ?UTI   Plan:  Medications reviewed Continue IV antibiotic Continue wound care Continue current treatment    LOS: 3 days   Slaton Reaser 05/18/2015, 8:48 AM

## 2015-05-19 LAB — CBC
HEMATOCRIT: 31.8 % — AB (ref 36.0–46.0)
HEMOGLOBIN: 10.5 g/dL — AB (ref 12.0–15.0)
MCH: 27.9 pg (ref 26.0–34.0)
MCHC: 33 g/dL (ref 30.0–36.0)
MCV: 84.4 fL (ref 78.0–100.0)
Platelets: 299 10*3/uL (ref 150–400)
RBC: 3.77 MIL/uL — ABNORMAL LOW (ref 3.87–5.11)
RDW: 15 % (ref 11.5–15.5)
WBC: 8.7 10*3/uL (ref 4.0–10.5)

## 2015-05-19 MED ORDER — LACOSAMIDE 100 MG PO TABS: 100.0000 mg | ORAL_TABLET | Freq: Two times a day (BID) | ORAL | Status: DC

## 2015-05-19 NOTE — Progress Notes (Deleted)
Report called to [facility] unit 300. Patient transported via wheelchair to room 318. No distress noted.

## 2015-05-19 NOTE — Discharge Summary (Signed)
Physician Discharge Summary  Patient ID: [name] MRN: [id number] DOB/AGE: 1965/09/12 49 y.o. Primary Care Physician:Calli Bashor, MD Admit date: 05/15/2015 Discharge date: 05/19/2015    Discharge Diagnoses:   Active Problems:   Paraplegia ([place])   Suprapubic catheter ([place])   Seizure disorder ([facility])   Acute encephalopathy   Pressure ulcer     Medication List    STOP taking these medications        azithromycin 250 MG tablet  Commonly known as:  ZITHROMAX Z-PAK     baclofen 20 MG tablet  Commonly known as:  LIORESAL      TAKE these medications        amLODipine 10 MG tablet  Commonly known as:  NORVASC  Take 1 tablet (10 mg total) by mouth daily.     cloNIDine 0.2 MG tablet  Commonly known as:  CATAPRES  Take 0.2 mg by mouth 3 (three) times daily.     desmopressin 0.2 MG tablet  Commonly known as:  DDAVP  Take 200 mcg by mouth at bedtime.     docusate sodium 100 MG capsule  Commonly known as:  COLACE  Take 100 mg by mouth at bedtime.     furosemide 20 MG tablet  Commonly known as:  LASIX  Take 20 mg by mouth daily.     gabapentin 300 MG capsule  Commonly known as:  NEURONTIN  Take 300 mg by mouth 3 (three) times daily.     Lacosamide 100 MG Tabs  Take 1 tablet (100 mg total) by mouth 2 (two) times daily.     levETIRAcetam 500 MG tablet  Commonly known as:  KEPPRA  Take 3 tablets (1,500 mg total) by mouth 2 (two) times daily.     metoprolol tartrate 25 MG tablet  Commonly known as:  LOPRESSOR  Take 1 tablet (25 mg total) by mouth 2 (two) times daily.     MULTIVITAMIN ADULT PO  Take 1 tablet by mouth daily.     pantoprazole 40 MG tablet  Commonly known as:  PROTONIX  Take 1 tablet (40 mg total) by mouth 2 (two) times daily.     potassium chloride SA 20 MEQ tablet  Commonly known as:  K-DUR,KLOR-CON  Take 20 mEq by mouth daily.     vitamin C 500 MG tablet  Commonly known as:  ASCORBIC ACID  Take 500 mg by mouth daily.     zinc  gluconate 50 MG tablet  Take 50 mg by mouth daily.        Discharged Condition: improved    Consults: Neurology  Significant Diagnostic Studies: Ct Head Wo Contrast  05/15/2015  CLINICAL DATA:  Altered mental status EXAM: CT HEAD WITHOUT CONTRAST TECHNIQUE: Contiguous axial images were obtained from the base of the skull through the vertex without intravenous contrast. COMPARISON:  11/18/2014 FINDINGS: No intracranial hemorrhage, mass effect or midline shift. No skull fracture is noted. Mild ventriculomegaly again noted. Stable chronic encephalomalacia left parietal lobe. No acute cortical infarction. Paranasal sinuses and mastoid air cells are unremarkable. No mass lesion is noted on this unenhanced scan. IMPRESSION: No acute intracranial abnormality. No definite acute cortical infarction. Ventriculomegaly again noted. Stable encephalomalacia in left parietal lobe high convexity. Electronically Signed   By: Lahoma Crocker M.D.   On: 05/15/2015 13:36   Ct Angio Chest Pe W/cm &/or Wo Cm  05/15/2015  CLINICAL DATA:  Shortness of breath and tachycardia. Altered mental status EXAM: CT ANGIOGRAPHY CHEST WITH CONTRAST TECHNIQUE: Multidetector CT  imaging of the chest was performed using the standard protocol during bolus administration of intravenous contrast. Multiplanar CT image reconstructions and MIPs were obtained to evaluate the vascular anatomy. CONTRAST:  198mL OMNIPAQUE IOHEXOL 350 MG/ML SOLN COMPARISON:  Chest radiograph May 15, 2015 FINDINGS: There is no demonstrable pulmonary embolus. There is no appreciable thoracic aortic aneurysm or dissection. The visualized great vessels appear unremarkable. There are moderate pleural effusions bilaterally, larger on the right than on the left. There is atelectasis in the right lower lobe with a lesser degree of left lower lobe atelectatic change. Lungs elsewhere are clear. Thyroid appears unremarkable. There is no appreciable thoracic adenopathy. The  pericardium is not thickened. In the visualized upper abdomen, there is hepatic steatosis. There is rod fixation throughout much of the thoracic spine. The T7 vertebral body partially overlies the T8 vertebral body with antral listhesis and rightward displacement T8 with respect to T7. There is osteoarthritic change in the lower thoracic spine. There are no blastic or lytic bone lesions. Review of the MIP images confirms the above findings. IMPRESSION: No demonstrable pulmonary embolus. Bilateral pleural effusions, larger on the right than on the left with bibasilar atelectasis, more severe on the right than on the left. Superimposed pneumonia in the right base cannot be excluded. There is extensive thoracic spine fixation. Note that there is offset of the vertebral bodies at T7-T8. There is hepatic steatosis. Electronically Signed   By: Lowella Grip III M.D.   On: 05/15/2015 16:32   Mr Jeri Cos [id number] Contrast  05/16/2015  CLINICAL DATA:  Confusion and seizure disorder.  Uterine cancer. EXAM: MRI HEAD WITHOUT AND WITH CONTRAST TECHNIQUE: Multiplanar, multiecho pulse sequences of the brain and surrounding structures were obtained without and with intravenous contrast. CONTRAST:  38mL MULTIHANCE GADOBENATE DIMEGLUMINE 529 MG/ML IV SOLN COMPARISON:  10/04/2014 FINDINGS: Calvarium and upper cervical spine: No focal marrow signal abnormality. Orbits: No significant findings. Sinuses and Mastoids: Clear. Brain: The asymmetric prominent diffusion hyperintensity in the right frontal cortex that is attributed to artifact as there is no swelling on FLAIR or enhancement as seen with seizure phenomenon or cerebritis. There is no abnormal intracranial enhancement to suggest metastatic disease. Chronic cortical and subcortical left frontal gliosis at the vertex posteriorly, which could serve as seizure focus. Unchanged ventriculomegaly of the lateral ventricles with marked thinning of the corpus callosum body. Question if  this is posttraumatic as there is hemosiderin staining in this region on gradient imaging. The septum pellucidum appears partially deficient and underlying congenital malformation is not excluded. Stable mild nonspecific signal abnormality around the lateral ventricles. IMPRESSION: 1. No acute finding or change from studies earlier this year. No evidence of metastatic disease. 2. Chronic changes are described above. Electronically Signed   By: Monte Fantasia M.D.   On: 05/16/2015 21:46   Ct Abdomen Pelvis W Contrast  04/28/2015  CLINICAL DATA:  Abdominal pain, distension, iron deficiency anemia. Paraplegic, history of uterine cancer, back surgery. EXAM: CT ABDOMEN AND PELVIS WITH CONTRAST TECHNIQUE: Multidetector CT imaging of the abdomen and pelvis was performed using the standard protocol following bolus administration of intravenous contrast. CONTRAST:  180mL OMNIPAQUE IOHEXOL 300 MG/ML  SOLN COMPARISON:  CT abdomen and pelvis dated 06/21/2012 and acute abdomen series dated 04/12/2015. FINDINGS: There are pleural effusions at each lung base, incompletely imaged but at least moderate in size, with adjacent mild compressive atelectasis. Heart size is upper normal, similar to the previous exam. Liver is low in density suggesting fatty infiltration. No  focal mass or lesion within the liver. Gallbladder appears normal. No bile duct dilatation. Pancreas appears normal. Spleen appears normal. Adrenal glands are unremarkable. 3 x 2.9 cm hypodense mass within the left adnexa is consistent with small ovarian cyst or more likely normal dominant follicle. Right adnexal region is unremarkable. There is extensive scarring of the right kidney. Milder scarring noted within the left kidney, upper and lower pole regions. No renal stone or hydronephrosis. No perinephric fluid or perinephric inflammation. No ureteral or bladder calculi. Bladder decompressed by a suprapubic catheter. Gastrostomy tube appears well positioned  without complicating feature. Stomach appears otherwise normal. Large and small bowel are normal in caliber. No bowel wall thickening or evidence of bowel wall inflammation. Appendix is normal. No free fluid or abscess collection. No free intraperitoneal air. No enlarged lymph nodes. Abdominal aorta is normal in caliber and configuration. Surgical fixation hardware again noted within the lower thoracic spine, grossly intact and well positioned. No acute osseous abnormality appreciated. There is mild edema within the subcutaneous soft tissues of the abdomen and pelvis suggesting anasarca. IMPRESSION: 1. Bilateral pleural effusions, incompletely imaged but at least moderate in size bilaterally with adjacent compressive atelectasis. 2. No evidence of acute intra-abdominal or intrapelvic abnormality. No free fluid or inflammatory change within the abdomen or pelvis. No bowel obstruction. No renal or ureteral calculi. No evidence of acute/significant solid organ abnormality. No evidence of pyelonephritis. 3. Probable mild anasarca. 4. Suprapubic catheter appears well positioned. Bladder is decompressed. Gastrostomy tube appears well positioned without complicating feature. 5. Additional chronic/incidental findings detailed above. Electronically Signed   By: Franki Cabot M.D.   On: 04/28/2015 16:10   Dg Chest Port 1 View  05/15/2015  CLINICAL DATA:  Altered mental status.  Unable to communicate. EXAM: PORTABLE CHEST 1 VIEW COMPARISON:  05/10/2015 FINDINGS: There is no focal parenchymal opacity. There is no pleural effusion or pneumothorax. The heart and mediastinal contours are unremarkable. There is posterior spinal fusion hardware. IMPRESSION: No active disease. Electronically Signed   By: Kathreen Devoid   On: 05/15/2015 12:04   Dg Chest Portable 1 View  05/10/2015  CLINICAL DATA:  Productive cough and congestion. EXAM: PORTABLE CHEST - 1 VIEW COMPARISON:  Two-view chest x-ray 12/23/2014 FINDINGS: The heart size  is normal. Left basilar airspace disease is present. A left pleural effusion is suspected. There is minimal atelectasis at the right base. Spinal surgery is again noted. A remote right clavicle fracture is evident. The visualized soft tissues and bony thorax are otherwise unremarkable. IMPRESSION: 1. Left lower lobe pneumonia. 2. Probable left pleural effusion. 3. Minimal atelectasis on the right. Electronically Signed   By: San Morelle M.D.   On: 05/10/2015 15:17    Lab Results: Basic Metabolic Panel: No results for input(s): NA, K, CL, CO2, GLUCOSE, BUN, CREATININE, CALCIUM, MG, PHOS in the last 72 hours. Liver Function Tests: No results for input(s): AST, ALT, ALKPHOS, BILITOT, PROT, ALBUMIN in the last 72 hours.   CBC:  Recent Labs  05/18/15 0545 05/19/15 0514  WBC 9.9 8.7  HGB 10.9* 10.5*  HCT 32.9* 31.8*  MCV 84.1 84.4  PLT 322 299    Recent Results (from the past 240 hour(s))  Culture, blood (routine x 2)     Status: None (Preliminary result)   Collection Time: 05/15/15 11:04 AM  Result Value Ref Range Status   Specimen Description BLOOD  Final   Special Requests NONE  Final   Culture NO GROWTH 4 DAYS  Final  Report Status PENDING  Incomplete  Urine culture     Status: None   Collection Time: 05/15/15 11:40 AM  Result Value Ref Range Status   Specimen Description URINE, CATHETERIZED  Final   Special Requests NONE  Final   Culture   Final    MULTIPLE SPECIES PRESENT, SUGGEST RECOLLECTION Performed at [facility]    Report Status 05/17/2015 FINAL  Final  Culture, blood (routine x 2)     Status: None (Preliminary result)   Collection Time: 05/15/15 11:47 AM  Result Value Ref Range Status   Specimen Description BLOOD  Final   Special Requests NONE  Final   Culture NO GROWTH 4 DAYS  Final   Report Status PENDING  Incomplete  MRSA PCR Screening     Status: Abnormal   Collection Time: 05/15/15  5:30 PM  Result Value Ref Range Status   MRSA by PCR  POSITIVE (A) NEGATIVE Final    Comment:        The GeneXpert MRSA Assay (FDA approved for NASAL specimens only), is one component of a comprehensive MRSA colonization surveillance program. It is not intended to diagnose MRSA infection nor to guide or monitor treatment for MRSA infections. RESULT CALLED TO, READ BACK BY AND VERIFIED WITH: HAMMOCK S AT 0103 ON 122016 BY [facility] Course:   This is a 49 years old female who was admitted due to change in mental status. On admission patient was admitted on emperic antibiotics but her blood and urine culture was negative. Patient was evaluated by neurology and seizure medications adjusted. Her mental status improved and she is back to her baseline. Home health will continue wound.  Discharge Exam: Blood pressure 127/86, pulse 69, temperature 97.3 F (36.3 C), temperature source Oral, resp. rate 12, height 5\' 2"  (1.575 m), weight 73.9 kg (162 lb 14.7 oz), last menstrual period 05/03/2015, SpO2 98 %.   Disposition:  home      Signed: Lucillie Kiesel   05/19/2015, 8:28 AM

## 2015-05-19 NOTE — Care Management Note (Signed)
Case Management Note  Patient Details  Name: [name] MRN: [id number] Date of Birth: 06/28/1965  Expected Discharge Date:    05/19/2015              Expected Discharge Plan:  [place]  In-House Referral:  NA  Discharge planning Services  CM Consult  Post Acute Care Choice:  Resumption of Svcs/PTA Provider Choice offered to:  Patient  DME Arranged:    DME Agency:     HH Arranged:  RN [place] Agency:  [place]  Status of Service:  Completed, signed off  Medicare Important Message Given:    Date Medicare IM Given:    Medicare IM give by:    Date Additional Medicare IM Given:    Additional Medicare Important Message give by:     If discussed at [place] of Stay Meetings, dates discussed:    Additional Comments: Pt returning home today with resumption of HH services through [facility]. Kris Hartmann, from [facility], made aware of DC plan and will obtain pt info from chart. Pt aware HH has 48 hours to resume services after DC. No further CM needs.   Sherald Barge, RN 05/19/2015, 9:23 AM

## 2015-05-19 NOTE — Progress Notes (Signed)
Discharge instruction reviewed patient. Patient states her family takes care of medications. IV removed. Transported home via EMS. No distress noted.

## 2015-05-20 LAB — CULTURE, BLOOD (ROUTINE X 2)
CULTURE: NO GROWTH
CULTURE: NO GROWTH

## 2015-05-24 ENCOUNTER — Telehealth (INDEPENDENT_AMBULATORY_CARE_PROVIDER_SITE_OTHER): Payer: Self-pay | Admitting: *Deleted

## 2015-05-24 NOTE — Telephone Encounter (Signed)
   Diagnosis:    Result(s)   Card 1: Negative:           Completed by:    HEMOCCULT SENSA DEVELOPER: LOT#: [id number]  EXPIRATION DATE: 9-14   HEMOCCULT SENSA CARD:  LOT#:  02/14 EXPIRATION DATE: 07/18   CARD CONTROL RESULTS:  POSITIVE: Positive NEGATIVE: Negative    ADDITIONAL COMMENTS: Result forwarded to Karna Christmas

## 2015-06-01 ENCOUNTER — Telehealth (INDEPENDENT_AMBULATORY_CARE_PROVIDER_SITE_OTHER): Payer: Self-pay | Admitting: *Deleted

## 2015-06-01 NOTE — Telephone Encounter (Signed)
Results have been given to patient 

## 2015-06-01 NOTE — Telephone Encounter (Signed)
   Diagnosis:    Result(s)   Card 1: Negative:     Card 2: Negative:       Completed by: Thomas Hoff, LPN   HEMOCCULT SENSA DEVELOPER: LOT#:  [id number] EXPIRATION DATE: 9-17   HEMOCCULT SENSA CARD:  LOT#: 02/14   EXPIRATION DATE: 07/18   CARD CONTROL RESULTS:  POSITIVE: Positive NEGATIVE: Negative    ADDITIONAL COMMENTS:

## 2015-06-02 NOTE — Telephone Encounter (Signed)
Results have been given to patient 

## 2015-07-12 ENCOUNTER — Other Ambulatory Visit (HOSPITAL_COMMUNITY)
Admission: RE | Admit: 2015-07-12 | Discharge: 2015-07-12 | Disposition: A | Discharge status: Home / Self Care | Payer: Medicaid Other | Source: Other Acute Inpatient Hospital | Attending: Internal Medicine | Admitting: Internal Medicine

## 2015-07-12 DIAGNOSIS — E46 Unspecified protein-calorie malnutrition: Secondary | ICD-10-CM | POA: Diagnosis present

## 2015-07-12 LAB — COMPREHENSIVE METABOLIC PANEL
ALK PHOS: 112 U/L (ref 38–126)
ALT: 23 U/L (ref 14–54)
AST: 32 U/L (ref 15–41)
Albumin: 1.6 g/dL — ABNORMAL LOW (ref 3.5–5.0)
Anion gap: 5 (ref 5–15)
BILIRUBIN TOTAL: 0.2 mg/dL — AB (ref 0.3–1.2)
BUN: 14 mg/dL (ref 6–20)
CHLORIDE: 112 mmol/L — AB (ref 101–111)
CO2: 22 mmol/L (ref 22–32)
CREATININE: 1.42 mg/dL — AB (ref 0.44–1.00)
Calcium: 8.1 mg/dL — ABNORMAL LOW (ref 8.9–10.3)
GFR calc non Af Amer: 43 mL/min — ABNORMAL LOW (ref >60)
GFR, EST AFRICAN AMERICAN: 50 mL/min — AB (ref >60)
GLUCOSE: 99 mg/dL (ref 65–99)
Potassium: 4.8 mmol/L (ref 3.5–5.1)
Sodium: 139 mmol/L (ref 135–145)
Total Protein: 5.6 g/dL — ABNORMAL LOW (ref 6.5–8.1)

## 2015-07-12 LAB — CBC WITH DIFFERENTIAL/PLATELET
Basophils Absolute: 0 10*3/uL (ref 0.0–0.1)
Basophils Relative: 1 %
Eosinophils Absolute: 0.6 10*3/uL (ref 0.0–0.7)
Eosinophils Relative: 8 %
HEMATOCRIT: 26.3 % — AB (ref 36.0–46.0)
HEMOGLOBIN: 8.6 g/dL — AB (ref 12.0–15.0)
LYMPHS ABS: 2 10*3/uL (ref 0.7–4.0)
LYMPHS PCT: 30 %
MCH: 29.2 pg (ref 26.0–34.0)
MCHC: 32.7 g/dL (ref 30.0–36.0)
MCV: 89.2 fL (ref 78.0–100.0)
MONOS PCT: 5 %
Monocytes Absolute: 0.4 10*3/uL (ref 0.1–1.0)
NEUTROS ABS: 3.7 10*3/uL (ref 1.7–7.7)
NEUTROS PCT: 56 %
Platelets: 382 10*3/uL (ref 150–400)
RBC: 2.95 MIL/uL — ABNORMAL LOW (ref 3.87–5.11)
RDW: 15.9 % — ABNORMAL HIGH (ref 11.5–15.5)
WBC: 6.7 10*3/uL (ref 4.0–10.5)

## 2015-07-13 LAB — PREALBUMIN: PREALBUMIN: 12.1 mg/dL — AB (ref 18–38)

## 2015-07-19 ENCOUNTER — Other Ambulatory Visit (HOSPITAL_COMMUNITY)
Admission: RE | Admit: 2015-07-19 | Discharge: 2015-07-19 | Disposition: A | Discharge status: Home / Self Care | Payer: Medicaid Other | Source: Other Acute Inpatient Hospital | Attending: Internal Medicine | Admitting: Internal Medicine

## 2015-07-19 DIAGNOSIS — D599 Acquired hemolytic anemia, unspecified: Secondary | ICD-10-CM | POA: Diagnosis present

## 2015-07-19 LAB — FERRITIN: FERRITIN: 133 ng/mL (ref 11–307)

## 2015-07-19 LAB — IRON AND TIBC
IRON: 57 ug/dL (ref 28–170)
Saturation Ratios: 51 % — ABNORMAL HIGH (ref 10.4–31.8)
TIBC: 112 ug/dL — ABNORMAL LOW (ref 250–450)
UIBC: 55 ug/dL

## 2015-08-16 ENCOUNTER — Inpatient Hospital Stay (HOSPITAL_COMMUNITY)
Admission: EM | Admit: 2015-08-16 | Discharge: 2015-08-19 | DRG: 698 | Disposition: A | Discharge status: Home Health | Payer: Medicaid Other | Attending: Internal Medicine | Admitting: Internal Medicine

## 2015-08-16 ENCOUNTER — Emergency Department (HOSPITAL_COMMUNITY): Payer: Medicaid Other

## 2015-08-16 ENCOUNTER — Encounter (HOSPITAL_COMMUNITY): Payer: Self-pay

## 2015-08-16 DIAGNOSIS — N390 Urinary tract infection, site not specified: Secondary | ICD-10-CM | POA: Diagnosis present

## 2015-08-16 DIAGNOSIS — I10 Essential (primary) hypertension: Secondary | ICD-10-CM | POA: Diagnosis present

## 2015-08-16 DIAGNOSIS — Z9359 Other cystostomy status: Secondary | ICD-10-CM

## 2015-08-16 DIAGNOSIS — Z87820 Personal history of traumatic brain injury: Secondary | ICD-10-CM

## 2015-08-16 DIAGNOSIS — G8220 Paraplegia, unspecified: Secondary | ICD-10-CM | POA: Diagnosis present

## 2015-08-16 DIAGNOSIS — G40901 Epilepsy, unspecified, not intractable, with status epilepticus: Secondary | ICD-10-CM | POA: Diagnosis present

## 2015-08-16 DIAGNOSIS — Z803 Family history of malignant neoplasm of breast: Secondary | ICD-10-CM

## 2015-08-16 DIAGNOSIS — Y846 Urinary catheterization as the cause of abnormal reaction of the patient, or of later complication, without mention of misadventure at the time of the procedure: Secondary | ICD-10-CM | POA: Diagnosis present

## 2015-08-16 DIAGNOSIS — G9340 Encephalopathy, unspecified: Secondary | ICD-10-CM

## 2015-08-16 DIAGNOSIS — G9341 Metabolic encephalopathy: Secondary | ICD-10-CM | POA: Diagnosis present

## 2015-08-16 DIAGNOSIS — G40909 Epilepsy, unspecified, not intractable, without status epilepticus: Secondary | ICD-10-CM | POA: Diagnosis not present

## 2015-08-16 DIAGNOSIS — R400 Somnolence: Secondary | ICD-10-CM

## 2015-08-16 DIAGNOSIS — E43 Unspecified severe protein-calorie malnutrition: Secondary | ICD-10-CM | POA: Diagnosis present

## 2015-08-16 DIAGNOSIS — Z8249 Family history of ischemic heart disease and other diseases of the circulatory system: Secondary | ICD-10-CM | POA: Diagnosis not present

## 2015-08-16 DIAGNOSIS — T83511A Infection and inflammatory reaction due to indwelling urethral catheter, initial encounter: Principal | ICD-10-CM | POA: Diagnosis present

## 2015-08-16 DIAGNOSIS — Z8542 Personal history of malignant neoplasm of other parts of uterus: Secondary | ICD-10-CM

## 2015-08-16 DIAGNOSIS — R4182 Altered mental status, unspecified: Secondary | ICD-10-CM | POA: Diagnosis present

## 2015-08-16 DIAGNOSIS — L89324 Pressure ulcer of left buttock, stage 4: Secondary | ICD-10-CM | POA: Diagnosis present

## 2015-08-16 DIAGNOSIS — L89314 Pressure ulcer of right buttock, stage 4: Secondary | ICD-10-CM | POA: Diagnosis present

## 2015-08-16 DIAGNOSIS — Z833 Family history of diabetes mellitus: Secondary | ICD-10-CM | POA: Diagnosis not present

## 2015-08-16 LAB — URINALYSIS, ROUTINE W REFLEX MICROSCOPIC
Bilirubin Urine: NEGATIVE
GLUCOSE, UA: NEGATIVE mg/dL
Hgb urine dipstick: NEGATIVE
Ketones, ur: NEGATIVE mg/dL
NITRITE: NEGATIVE
PH: 8.5 — AB (ref 5.0–8.0)
Protein, ur: >300 mg/dL — AB
SPECIFIC GRAVITY, URINE: 1.015 (ref 1.005–1.030)

## 2015-08-16 LAB — CBC WITH DIFFERENTIAL/PLATELET
BASOS PCT: 1 %
Basophils Absolute: 0.1 10*3/uL (ref 0.0–0.1)
EOS ABS: 0.3 10*3/uL (ref 0.0–0.7)
EOS PCT: 4 %
HCT: 30.4 % — ABNORMAL LOW (ref 36.0–46.0)
HEMOGLOBIN: 10.1 g/dL — AB (ref 12.0–15.0)
Lymphocytes Relative: 23 %
Lymphs Abs: 2 10*3/uL (ref 0.7–4.0)
MCH: 30.1 pg (ref 26.0–34.0)
MCHC: 33.2 g/dL (ref 30.0–36.0)
MCV: 90.5 fL (ref 78.0–100.0)
MONOS PCT: 6 %
Monocytes Absolute: 0.6 10*3/uL (ref 0.1–1.0)
NEUTROS PCT: 66 %
Neutro Abs: 5.8 10*3/uL (ref 1.7–7.7)
PLATELETS: 326 10*3/uL (ref 150–400)
RBC: 3.36 MIL/uL — ABNORMAL LOW (ref 3.87–5.11)
RDW: 14.2 % (ref 11.5–15.5)
WBC: 8.8 10*3/uL (ref 4.0–10.5)

## 2015-08-16 LAB — COMPREHENSIVE METABOLIC PANEL
ALBUMIN: 1.7 g/dL — AB (ref 3.5–5.0)
ALK PHOS: 115 U/L (ref 38–126)
ALT: 23 U/L (ref 14–54)
ANION GAP: 6 (ref 5–15)
AST: 28 U/L (ref 15–41)
BUN: 10 mg/dL (ref 6–20)
CHLORIDE: 107 mmol/L (ref 101–111)
CO2: 25 mmol/L (ref 22–32)
Calcium: 7.7 mg/dL — ABNORMAL LOW (ref 8.9–10.3)
Creatinine, Ser: 0.9 mg/dL (ref 0.44–1.00)
GFR calc Af Amer: >60 mL/min (ref >60)
GFR calc non Af Amer: >60 mL/min (ref >60)
GLUCOSE: 106 mg/dL — AB (ref 65–99)
POTASSIUM: 3.8 mmol/L (ref 3.5–5.1)
SODIUM: 138 mmol/L (ref 135–145)
Total Bilirubin: 0.3 mg/dL (ref 0.3–1.2)
Total Protein: 5.7 g/dL — ABNORMAL LOW (ref 6.5–8.1)

## 2015-08-16 LAB — RAPID URINE DRUG SCREEN, HOSP PERFORMED
AMPHETAMINES: NOT DETECTED
BARBITURATES: NOT DETECTED
Benzodiazepines: NOT DETECTED
COCAINE: NOT DETECTED
OPIATES: NOT DETECTED
TETRAHYDROCANNABINOL: NOT DETECTED

## 2015-08-16 LAB — URINE MICROSCOPIC-ADD ON

## 2015-08-16 LAB — I-STAT CG4 LACTIC ACID, ED
Lactic Acid, Venous: 1.08 mmol/L (ref 0.5–2.0)
Lactic Acid, Venous: 0.42 mmol/L — ABNORMAL LOW (ref 0.5–2.0)

## 2015-08-16 MED ORDER — DESMOPRESSIN ACETATE 0.2 MG PO TABS
ORAL_TABLET | ORAL | Status: AC
  Filled 2015-08-16: qty 1

## 2015-08-16 MED ORDER — VITAMIN C 500 MG PO TABS
500.0000 mg | ORAL_TABLET | Freq: Every day | ORAL | Status: DC
  Administered 2015-08-16 – 2015-08-19 (×4): 500 mg via ORAL
  Filled 2015-08-16 (×4): qty 1

## 2015-08-16 MED ORDER — DEXTROSE-NACL 5-0.9 % IV SOLN
INTRAVENOUS | Status: DC
  Administered 2015-08-16 – 2015-08-18 (×3): via INTRAVENOUS

## 2015-08-16 MED ORDER — GABAPENTIN 300 MG PO CAPS
300.0000 mg | ORAL_CAPSULE | Freq: Three times a day (TID) | ORAL | Status: DC
  Administered 2015-08-16 – 2015-08-19 (×9): 300 mg via ORAL
  Filled 2015-08-16 (×9): qty 1

## 2015-08-16 MED ORDER — ENOXAPARIN SODIUM 40 MG/0.4ML ~~LOC~~ SOLN
40.0000 mg | SUBCUTANEOUS | Status: DC
  Administered 2015-08-16 – 2015-08-18 (×3): 40 mg via SUBCUTANEOUS
  Filled 2015-08-16 (×3): qty 0.4

## 2015-08-16 MED ORDER — LACOSAMIDE 50 MG PO TABS
100.0000 mg | ORAL_TABLET | Freq: Two times a day (BID) | ORAL | Status: DC
  Administered 2015-08-16 – 2015-08-19 (×6): 100 mg via ORAL
  Filled 2015-08-16 (×6): qty 2

## 2015-08-16 MED ORDER — AMLODIPINE BESYLATE 5 MG PO TABS
10.0000 mg | ORAL_TABLET | Freq: Every day | ORAL | Status: DC
  Administered 2015-08-16 – 2015-08-19 (×4): 10 mg via ORAL
  Filled 2015-08-16 (×4): qty 2

## 2015-08-16 MED ORDER — DEXTROSE 5 % IV SOLN
1.0000 g | INTRAVENOUS | Status: DC
  Administered 2015-08-17 – 2015-08-19 (×3): 1 g via INTRAVENOUS
  Filled 2015-08-16 (×4): qty 10

## 2015-08-16 MED ORDER — DESMOPRESSIN ACETATE 0.2 MG PO TABS
200.0000 ug | ORAL_TABLET | Freq: Every day | ORAL | Status: DC
  Administered 2015-08-16 – 2015-08-18 (×3): 200 ug via ORAL
  Filled 2015-08-16 (×5): qty 1

## 2015-08-16 MED ORDER — SODIUM CHLORIDE 0.9% FLUSH
3.0000 mL | Freq: Two times a day (BID) | INTRAVENOUS | Status: DC
  Administered 2015-08-18: 3 mL via INTRAVENOUS

## 2015-08-16 MED ORDER — LEVETIRACETAM 500 MG PO TABS
1500.0000 mg | ORAL_TABLET | Freq: Two times a day (BID) | ORAL | Status: DC
  Administered 2015-08-16 – 2015-08-19 (×6): 1500 mg via ORAL
  Filled 2015-08-16 (×6): qty 3

## 2015-08-16 MED ORDER — SODIUM CHLORIDE 0.9 % IV BOLUS (SEPSIS)
2000.0000 mL | Freq: Once | INTRAVENOUS | Status: AC
  Administered 2015-08-16: 2000 mL via INTRAVENOUS

## 2015-08-16 MED ORDER — LEVETIRACETAM 500 MG PO TABS: 1500.0000 mg | ORAL_TABLET | Freq: Two times a day (BID) | ORAL | Status: DC

## 2015-08-16 MED ORDER — CLONIDINE HCL 0.2 MG PO TABS
0.2000 mg | ORAL_TABLET | Freq: Three times a day (TID) | ORAL | Status: DC
  Administered 2015-08-16 – 2015-08-19 (×9): 0.2 mg via ORAL
  Filled 2015-08-16 (×9): qty 1

## 2015-08-16 MED ORDER — NALOXONE HCL 2 MG/2ML IJ SOSY
1.0000 mg | PREFILLED_SYRINGE | Freq: Once | INTRAMUSCULAR | Status: AC
  Administered 2015-08-16: 1 mg via INTRAVENOUS
  Filled 2015-08-16: qty 2

## 2015-08-16 MED ORDER — DEXTROSE 5 % IV SOLN
1.0000 g | Freq: Once | INTRAVENOUS | Status: AC
  Administered 2015-08-16: 1 g via INTRAVENOUS
  Filled 2015-08-16: qty 10

## 2015-08-16 MED ORDER — ZINC GLUCONATE 50 MG PO TABS: 50.0000 mg | ORAL_TABLET | Freq: Every day | ORAL | Status: DC

## 2015-08-16 MED ORDER — DOCUSATE SODIUM 100 MG PO CAPS
100.0000 mg | ORAL_CAPSULE | Freq: Every day | ORAL | Status: DC
  Administered 2015-08-16 – 2015-08-18 (×3): 100 mg via ORAL
  Filled 2015-08-16 (×3): qty 1

## 2015-08-16 MED ORDER — METOPROLOL TARTRATE 25 MG PO TABS
25.0000 mg | ORAL_TABLET | Freq: Two times a day (BID) | ORAL | Status: DC
  Administered 2015-08-16 – 2015-08-19 (×6): 25 mg via ORAL
  Filled 2015-08-16 (×6): qty 1

## 2015-08-16 MED ORDER — PANTOPRAZOLE SODIUM 40 MG PO TBEC
40.0000 mg | DELAYED_RELEASE_TABLET | Freq: Two times a day (BID) | ORAL | Status: DC
  Administered 2015-08-16 – 2015-08-19 (×6): 40 mg via ORAL
  Filled 2015-08-16 (×6): qty 1

## 2015-08-16 NOTE — ED Provider Notes (Signed)
CSN: [id number]     Arrival date & time 08/16/15  1117 History  By signing my name below, I, [name], attest that this documentation has been prepared under the direction and in the presence of physician practitioner, Milton Ferguson, MD,. Electronically Signed: [name], Scribe. 08/16/2015. 12:17 PM.    Chief Complaint  Patient presents with  . Shortness of Breath  . Altered Mental Status    HPI Comments: Pt brought in by EMS for altered mental status and SOB onset today  Patient is a 50 y.o. female presenting with shortness of breath and altered mental status. The history is provided by a caregiver (Patient has been confused for 24 hours). No language interpreter was used.  Shortness of Breath Severity:  Unable to specify Onset quality:  Unable to specify Timing:  Unable to specify Progression:  Unable to specify Chronicity:  New Context: not animal exposure   Altered Mental Status Presenting symptoms: lethargy   Severity:  Unable to specify Episode history:  Unable to specify Timing:  Unable to specify Progression:  Unable to specify   HPI Comments: LEVEL 5 CAVEAT DUE TO [name] is a 50 y.o. female brought in by EMS with h/o lower paraplegia, seizures, and HTN who presents to the Emergency Department with altered mental status. Per EMS, pt was experiencing SOB upon their arrival to her home. Per EMS, pt is drowsy and lethargic but responsive to voice. She was recently treated for UTI. Pt endorses pain with palpation to her left chest; she is responsive to painful stimuli.  Past Medical History  Diagnosis Date  . Paraplegia (lower)   . Seizure disorder ([place])   . High blood pressure   . Suprapubic catheter ([place])   . Cancer ([place])     uterine  . Seizures ([place])   . Abnormal uterine bleeding ([facility]) 06/15/2014   Past Surgical History  Procedure Laterality Date  . Back surgery      Pt stated "before 2000"   Family History  Problem  Relation Age of Onset  . Cancer Mother   . Hypertension Mother   . Cancer Sister     breast and then spread everywhere.  . Diabetes Paternal Grandmother   . Hypertension Paternal Grandmother    Social History  Substance Use Topics  . Smoking status: Never Smoker   . Smokeless tobacco: Never Used  . Alcohol Use: No   OB History    Gravida Para Term Preterm AB TAB SAB Ectopic Multiple Living   4 3   1  1   3      Review of Systems  Unable to perform ROS: Mental status change  Respiratory: Positive for shortness of breath.       Allergies  Quinine derivatives and Azithromycin  Home Medications   Prior to Admission medications   Medication Sig Start Date End Date Taking? Authorizing Provider  amLODipine (NORVASC) 10 MG tablet Take 1 tablet (10 mg total) by mouth daily. 05/10/14   Donne Hazel, MD  cloNIDine (CATAPRES) 0.2 MG tablet Take 0.2 mg by mouth 3 (three) times daily.     Historical Provider, MD  desmopressin (DDAVP) 0.2 MG tablet Take 200 mcg by mouth at bedtime. 04/19/15   Historical Provider, MD  docusate sodium (COLACE) 100 MG capsule Take 100 mg by mouth at bedtime.    Historical Provider, MD  furosemide (LASIX) 20 MG tablet Take 20 mg by mouth daily.    Historical Provider, MD  gabapentin (NEURONTIN) 300 MG capsule Take 300 mg by mouth 3 (three) times daily.     Historical Provider, MD  lacosamide 100 MG TABS Take 1 tablet (100 mg total) by mouth 2 (two) times daily. 05/19/15   [name], MD  levETIRAcetam (KEPPRA) 500 MG tablet Take 3 tablets (1,500 mg total) by mouth 2 (two) times daily. 10/19/14   [name], MD  metoprolol tartrate (LOPRESSOR) 25 MG tablet Take 1 tablet (25 mg total) by mouth 2 (two) times daily. 05/10/14   Donne Hazel, MD  Multiple Vitamins-Minerals (MULTIVITAMIN ADULT PO) Take 1 tablet by mouth daily.     Historical Provider, MD  pantoprazole (PROTONIX) 40 MG tablet Take 1 tablet (40 mg total) by mouth 2 (two) times daily. 04/12/15    Jola Schmidt, MD  potassium chloride SA (K-DUR,KLOR-CON) 20 MEQ tablet Take 20 mEq by mouth daily.     Historical Provider, MD  vitamin C (ASCORBIC ACID) 500 MG tablet Take 500 mg by mouth daily.    Historical Provider, MD  zinc gluconate 50 MG tablet Take 50 mg by mouth daily.    Historical Provider, MD   BP 147/94 mmHg  Pulse 102  Resp 22  SpO2 98% Physical Exam  Constitutional: She is oriented to person, place, and time. She appears well-developed.  Lethargic    HENT:  Head: Normocephalic.  Mouth/Throat: Mucous membranes are dry.  Eyes: Conjunctivae and EOM are normal. No scleral icterus.  Neck: Neck supple. No thyromegaly present.  Cardiovascular: Normal rate and regular rhythm.  Exam reveals no gallop and no friction rub.   No murmur heard. Pulmonary/Chest: No stridor. She has no wheezes. She has no rales. She exhibits no tenderness.  Abdominal: She exhibits no distension. There is no tenderness. There is no rebound.  Musculoskeletal: Normal range of motion. She exhibits no edema.  Ulcer 2cm diameter lower back 2+ edema bilateral legs Paraplegic; can only move upper extremities to painful stimuli  Lymphadenopathy:    She has no cervical adenopathy.  Neurological: She is oriented to person, place, and time. She exhibits normal muscle tone. Coordination normal.  Responsive to painful stimuli only  Skin: No rash noted. No erythema.  Psychiatric: She has a normal mood and affect. Her behavior is normal.    ED Course  Procedures (including critical care time)  DIAGNOSTIC STUDIES: Oxygen Saturation is 98% on RA, normal by my interpretation.    COORDINATION OF CARE: 12:19 PM Will administer fluids and Narcan injection. Will order DG Chest Portable 1 View and CT Head w/o Contrast. Will order blood work and Urinalysis. Will order nursing communication. Discussed treatment plan with pt and pt agreed to plan.   Labs Review Labs Reviewed - No data to display  Imaging  Review No results found. I have personally reviewed and evaluated these images and lab results as part of my medical decision-making.   EKG Interpretation   Date/Time:  Wednesday August 16 2015 11:26:14 EDT Ventricular Rate:  97 PR Interval:  126 QRS Duration: 109 QT Interval:  368 QTC Calculation: 467 R Axis:   -59 Text Interpretation:  Sinus rhythm Incomplete RBBB and LAFB Low voltage,  precordial leads RSR' in V1 or V2, right VCD or RVH No significant change  since last tracing Confirmed by ZACKOWSKI  MD, SCOTT [phone]) on 08/16/2015  11:36:51 AM     CRITICAL CARE Performed by: Carmita Boom L Total critical care time: 35 minutes Critical care time was exclusive of separately billable procedures and  treating other patients. Critical care was necessary to treat or prevent imminent or life-threatening deterioration. Critical care was time spent personally by me on the following activities: development of treatment plan with patient and/or surrogate as well as nursing, discussions with consultants, evaluation of patient's response to treatment, examination of patient, obtaining history from patient or surrogate, ordering and performing treatments and interventions, ordering and review of laboratory studies, ordering and review of radiographic studies, pulse oximetry and re-evaluation of patient's condition.   MDM   Final diagnoses:  None    Patient being admitted for altered mental status. And urine urinary tract infection  Milton Ferguson, MD 08/16/15 1651

## 2015-08-16 NOTE — ED Notes (Signed)
Talked with patient's daughter who is care giver. Daughter states patient speaks and understands english very well. Daughter states that she was treated for a UTI last week by Dr. Legrand Rams. Daughter states patient started acting altered yesterday. Patient refused to eat and drink.  Daughter's name is Joelene Millin [phone]
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
## 2015-08-16 NOTE — ED Notes (Signed)
EMS reports was called out for decreased loc and agonal respirations.  EMS reports upon their arrival pt was breathing "heavy."  Reports pt very drowsy but responsive to voice.  Recently was treated for UTI.

## 2015-08-17 LAB — COMPREHENSIVE METABOLIC PANEL
ALK PHOS: 83 U/L (ref 38–126)
ALT: 16 U/L (ref 14–54)
ANION GAP: 5 (ref 5–15)
AST: 18 U/L (ref 15–41)
Albumin: 1.3 g/dL — ABNORMAL LOW (ref 3.5–5.0)
BUN: 8 mg/dL (ref 6–20)
CALCIUM: 7.2 mg/dL — AB (ref 8.9–10.3)
CHLORIDE: 115 mmol/L — AB (ref 101–111)
CO2: 24 mmol/L (ref 22–32)
Creatinine, Ser: 0.85 mg/dL (ref 0.44–1.00)
GFR calc non Af Amer: >60 mL/min (ref >60)
GLUCOSE: 88 mg/dL (ref 65–99)
Potassium: 3.8 mmol/L (ref 3.5–5.1)
Sodium: 144 mmol/L (ref 135–145)
Total Bilirubin: 0.4 mg/dL (ref 0.3–1.2)
Total Protein: 4.2 g/dL — ABNORMAL LOW (ref 6.5–8.1)

## 2015-08-17 LAB — CBC
HEMATOCRIT: 24.7 % — AB (ref 36.0–46.0)
Hemoglobin: 7.9 g/dL — ABNORMAL LOW (ref 12.0–15.0)
MCH: 29.5 pg (ref 26.0–34.0)
MCHC: 32 g/dL (ref 30.0–36.0)
MCV: 92.2 fL (ref 78.0–100.0)
PLATELETS: 277 10*3/uL (ref 150–400)
RBC: 2.68 MIL/uL — ABNORMAL LOW (ref 3.87–5.11)
RDW: 14.5 % (ref 11.5–15.5)
WBC: 5.7 10*3/uL (ref 4.0–10.5)

## 2015-08-17 LAB — URINE CULTURE
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
## 2015-08-17 NOTE — Consult Note (Signed)
WOC wound consult note Reason for Consult:Chronic stage 4 ischial pressure injuries, bilaterally Wound type:Chronic pressure injury to bilateral ischial tuberosities.  Wound VAC In place from home, saturated with moisture and device was not functioning.  Pressure Ulcer POA: Yes Measurement:LEft ischium:  2 cm x 2.2 cm x 2.4 cm  1 cm maceration present to periwound. Urine has saturated this dressing.  Right ischium 2 cm x 1.5 cm x 2 cm  Wound [id number] pink nongranulating Patient has SP cath in place, but there is constant urine leakage, saturating the NPWT dressing.  Attempted to bridge the two wounds together, across the sacrum to trochanter for offloading. No Y connectors available.  Urine has saturated the left ischial dressing x2.  Removed the left dressing and was able to maintain the seal on the right ischium.  Will use NS moist gauze to left ischium.   Drainage (amount, consistency, odor) Minimal serosanguinous.  HOME VAC IN ROOM AT BEDSIDE.  COnnected to facility unit.  Periwound:Maceration to left ischium from exposure to urine leakage and wet dressing with no NPWT in place.  Dressing procedure/placement/frequency:Cleanse left ischial wound with NS and pat gently dry.  Fill wound depth with NS moist gauze.  Cover with ABD pad and tape.  Change twice daily and PRN soilage. NPWT (VAC) therapy to right ischial wound. Change TUES/THURS/SAT Will not follow at this time.  Please re-consult if needed.  Domenic Moras RN BSN [place] Pager [phone]

## 2015-08-17 NOTE — Progress Notes (Signed)
Subjective: Patient was admitted last night due to change in mental status. She has UTI  but seizure as to be considered. Patient feels better this morning.  Objective: Vital signs in last 24 hours: Temp:  [98.2 F (36.8 C)-98.5 F (36.9 C)] 98.5 F (36.9 C) (03/23 0636) Pulse Rate:  [101-123] 101 (03/23 0636) Resp:  [13-25] 20 (03/23 0636) BP: (115-166)/(68-99) 155/68 mmHg (03/23 0636) SpO2:  [94 %-100 %] 97 % (03/23 0636) Weight change:  Last BM Date: 08/16/15  Intake/Output from previous day: 03/22 0701 - 03/23 0700 In: -  Out: 1225 [Urine:1225]  PHYSICAL EXAM General appearance: no distress and slowed mentation Resp: clear to auscultation bilaterally Cardio: S1, S2 normal GI: soft, non-tender; bowel sounds normal; no masses,  no organomegaly Extremities: paraplegic  Lab Results:  Results for orders placed or performed during the hospital encounter of 08/16/15 (from the past 48 hour(s))  I-Stat CG4 Lactic Acid, ED     Status: None   Collection Time: 08/16/15 12:46 PM  Result Value Ref Range   Lactic Acid, Venous 1.08 0.5 - 2.0 mmol/L  CBC with Differential/Platelet     Status: Abnormal   Collection Time: 08/16/15  1:04 PM  Result Value Ref Range   WBC 8.8 4.0 - 10.5 K/uL   RBC 3.36 (L) 3.87 - 5.11 MIL/uL   Hemoglobin 10.1 (L) 12.0 - 15.0 g/dL   HCT 30.4 (L) 36.0 - 46.0 %   MCV 90.5 78.0 - 100.0 fL   MCH 30.1 26.0 - 34.0 pg   MCHC 33.2 30.0 - 36.0 g/dL   RDW 14.2 11.5 - 15.5 %   Platelets 326 150 - 400 K/uL   Neutrophils Relative % 66 %   Neutro Abs 5.8 1.7 - 7.7 K/uL   Lymphocytes Relative 23 %   Lymphs Abs 2.0 0.7 - 4.0 K/uL   Monocytes Relative 6 %   Monocytes Absolute 0.6 0.1 - 1.0 K/uL   Eosinophils Relative 4 %   Eosinophils Absolute 0.3 0.0 - 0.7 K/uL   Basophils Relative 1 %   Basophils Absolute 0.1 0.0 - 0.1 K/uL  Comprehensive metabolic panel     Status: Abnormal   Collection Time: 08/16/15  1:04 PM  Result Value Ref Range   Sodium 138 135 - 145  mmol/L   Potassium 3.8 3.5 - 5.1 mmol/L   Chloride 107 101 - 111 mmol/L   CO2 25 22 - 32 mmol/L   Glucose, Bld 106 (H) 65 - 99 mg/dL   BUN 10 6 - 20 mg/dL   Creatinine, Ser 0.90 0.44 - 1.00 mg/dL   Calcium 7.7 (L) 8.9 - 10.3 mg/dL   Total Protein 5.7 (L) 6.5 - 8.1 g/dL   Albumin 1.7 (L) 3.5 - 5.0 g/dL   AST 28 15 - 41 U/L   ALT 23 14 - 54 U/L   Alkaline Phosphatase 115 38 - 126 U/L   Total Bilirubin 0.3 0.3 - 1.2 mg/dL   GFR calc non Af Amer >60 >60 mL/min   GFR calc Af Amer >60 >60 mL/min    Comment: (NOTE) The eGFR has been calculated using the CKD EPI equation. This calculation has not been validated in all clinical situations. eGFR's persistently <60 mL/min signify possible Chronic Kidney Disease.    Anion gap 6 5 - 15  Urinalysis, Routine w reflex microscopic (not at [facility])     Status: Abnormal   Collection Time: 08/16/15  1:06 PM  Result Value Ref Range   Color,  Urine YELLOW YELLOW   APPearance CLEAR CLEAR   Specific Gravity, Urine 1.015 1.005 - 1.030   pH 8.5 (H) 5.0 - 8.0   Glucose, UA NEGATIVE NEGATIVE mg/dL   Hgb urine dipstick NEGATIVE NEGATIVE   Bilirubin Urine NEGATIVE NEGATIVE   Ketones, ur NEGATIVE NEGATIVE mg/dL   Protein, ur >300 (A) NEGATIVE mg/dL   Nitrite NEGATIVE NEGATIVE   Leukocytes, UA MODERATE (A) NEGATIVE  Urine microscopic-add on     Status: Abnormal   Collection Time: 08/16/15  1:06 PM  Result Value Ref Range   Squamous Epithelial / LPF TOO NUMEROUS TO COUNT (A) NONE SEEN   WBC, UA TOO NUMEROUS TO COUNT 0 - 5 WBC/hpf   RBC / HPF 0-5 0 - 5 RBC/hpf   Bacteria, UA MANY (A) NONE SEEN  Urine rapid drug screen (hosp performed)     Status: None   Collection Time: 08/16/15  1:11 PM  Result Value Ref Range   Opiates NONE DETECTED NONE DETECTED   Cocaine NONE DETECTED NONE DETECTED   Benzodiazepines NONE DETECTED NONE DETECTED   Amphetamines NONE DETECTED NONE DETECTED   Tetrahydrocannabinol NONE DETECTED NONE DETECTED   Barbiturates NONE DETECTED  NONE DETECTED    Comment:        DRUG SCREEN FOR MEDICAL PURPOSES ONLY.  IF CONFIRMATION IS NEEDED FOR ANY PURPOSE, NOTIFY LAB WITHIN 5 DAYS.        LOWEST DETECTABLE LIMITS FOR URINE DRUG SCREEN Drug Class       Cutoff (ng/mL) Amphetamine      1000 Barbiturate      200 Benzodiazepine   315 Tricyclics       176 Opiates          300 Cocaine          300 THC              50   I-Stat CG4 Lactic Acid, ED     Status: Abnormal   Collection Time: 08/16/15  3:58 PM  Result Value Ref Range   Lactic Acid, Venous 0.42 (L) 0.5 - 2.0 mmol/L  Comprehensive metabolic panel     Status: Abnormal   Collection Time: 08/17/15  4:52 AM  Result Value Ref Range   Sodium 144 135 - 145 mmol/L   Potassium 3.8 3.5 - 5.1 mmol/L   Chloride 115 (H) 101 - 111 mmol/L   CO2 24 22 - 32 mmol/L   Glucose, Bld 88 65 - 99 mg/dL   BUN 8 6 - 20 mg/dL   Creatinine, Ser 0.85 0.44 - 1.00 mg/dL   Calcium 7.2 (L) 8.9 - 10.3 mg/dL   Total Protein 4.2 (L) 6.5 - 8.1 g/dL   Albumin 1.3 (L) 3.5 - 5.0 g/dL   AST 18 15 - 41 U/L   ALT 16 14 - 54 U/L   Alkaline Phosphatase 83 38 - 126 U/L   Total Bilirubin 0.4 0.3 - 1.2 mg/dL   GFR calc non Af Amer >60 >60 mL/min   GFR calc Af Amer >60 >60 mL/min    Comment: (NOTE) The eGFR has been calculated using the CKD EPI equation. This calculation has not been validated in all clinical situations. eGFR's persistently <60 mL/min signify possible Chronic Kidney Disease.    Anion gap 5 5 - 15  CBC     Status: Abnormal   Collection Time: 08/17/15  4:52 AM  Result Value Ref Range   WBC 5.7 4.0 - 10.5 K/uL   RBC 2.68 (  L) 3.87 - 5.11 MIL/uL   Hemoglobin 7.9 (L) 12.0 - 15.0 g/dL    Comment: DELTA CHECK NOTED RESULT REPEATED AND VERIFIED    HCT 24.7 (L) 36.0 - 46.0 %   MCV 92.2 78.0 - 100.0 fL   MCH 29.5 26.0 - 34.0 pg   MCHC 32.0 30.0 - 36.0 g/dL   RDW 14.5 11.5 - 15.5 %   Platelets 277 150 - 400 K/uL    ABGS No results for input(s): PHART, PO2ART, TCO2, HCO3 in the last  72 hours.  Invalid input(s): PCO2 CULTURES No results found for this or any previous visit (from the past 240 hour(s)). Studies/Results: Ct Head Wo Contrast  08/16/2015  CLINICAL DATA:  Lethargy with decreased level consciousness. EXAM: CT HEAD WITHOUT CONTRAST TECHNIQUE: Contiguous axial images were obtained from the base of the skull through the vertex without intravenous contrast. COMPARISON:  05/16/2015. FINDINGS: No evidence for acute infarction, hemorrhage, mass lesion, hydrocephalus, or extra-axial fluid. 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 Slight premature for age global atrophy is also noted. Calvarium is intact. No sinus or mastoid disease. IMPRESSION: Chronic changes as described. No acute intracranial abnormalities seen. Electronically Signed   By: Staci Righter M.D.   On: 08/16/2015 13:56   Dg Chest Portable 1 View  08/16/2015  CLINICAL DATA:  Weakness EXAM: PORTABLE CHEST 1 VIEW COMPARISON:  05/15/2015 FINDINGS: Cardiac shadow is mildly enlarged. Postsurgical changes in the thoracic spine are noted. The lungs are clear bilaterally. No focal infiltrate is seen. IMPRESSION: No active disease. Electronically Signed   By: Inez Catalina M.D.   On: 08/16/2015 13:09    Medications: I have reviewed the patient's current medications.  Assesment:   Principal Problem:   Acute encephalopathy Active Problems:   Paraplegia (HCC)   Suprapubic catheter (HCC)   Seizure disorder (HCC)   UTI (lower urinary tract infection)   Severe protein-calorie malnutrition (HCC)   Altered mental status    Plan:  Medications reviewed Neurology consult Wound consult Will follow urine culture result    LOS: 1 day   Mahamadou Weltz 08/17/2015, 8:32 AM

## 2015-08-18 NOTE — Care Management Note (Signed)
Case Management Note  Patient Details  Name: [name] MRN: [id number] Date of Birth: [birth year]  Subjective/Objective:                  Pt admitted with encephalopathy. Pt is from home, lives with family and requires total care. Pt has all necessary DME and is active with [facility] for [facility] nursing. Pt plans to return home with resumption of services through [facility].   Action/Plan: Pt will need order to resume services at DC. [facility] is aware of admission and will be updated on DC plan as applicable.   Expected Discharge Date:  08/19/15               Expected Discharge Plan:  [place]  In-House Referral:  NA  Discharge planning Services  CM Consult  Post Acute Care Choice:  Home Health, Resumption of Svcs/PTA Provider Choice offered to:  Adult Children  DME Arranged:    DME Agency:     HH Arranged:    HH Agency:     Status of Service:  In process, will continue to follow  Medicare Important Message Given:    Date Medicare IM Given:    Medicare IM give by:    Date Additional Medicare IM Given:    Additional Medicare Important Message give by:     If discussed at [place] of Stay Meetings, dates discussed:    Additional Comments:  Sherald Barge, RN 08/18/2015, 10:06 AM

## 2015-08-18 NOTE — Clinical Documentation Improvement (Signed)
Internal Medicine  Please clarify the type of Encephalopathy your patient has and document findings in next progress note. Thank you!   Metabolic Encephalopathy  Toxic Encephalopathy  Other Type of Encephalopathy  Clinically Undetermined  Supporting Information: UTI, possible seizure  Please exercise your independent, professional judgment when responding. A specific answer is not anticipated or expected.  Thank You, Zoila Shutter RN, BSN, [place] [phone]

## 2015-08-18 NOTE — Clinical Documentation Improvement (Signed)
Internal Medicine  Severe Protein Calorie Malnutrition documented under Active Problems. Problem List was reviewed and this diagnosis was dated 10/10/14. I see no mention of muscle or fat waste or nutritional supplements being given. Please clarify if:  Please document findings in next progress note.   Severe Protein Calorie Malnutrition ruled in for current admission  Severe Protein Calorie Malnutrition ruled out  Other  Clinically Undetermined  Supporting Information:  No height, weight BMI found in chart  Please exercise your independent, professional judgment when responding. A specific answer is not anticipated or expected.  Thank You, Zoila Shutter RN, BSN, [place] [phone]

## 2015-08-18 NOTE — Clinical Documentation Improvement (Signed)
Internal Medicine  A cause and effect relationship may not be assumed and must be documented by a provider.  Please clarify the relationship, if any, between UTI and Chronic Indwelling Foley and document findings in next progress note.  Are the conditions:   Due to or associated with each other  Unrelated to each other  Other  Clinically Undetermined  Supporting Information (risk factors, sign and symptoms, diagnostics, treatment):  Paraplegic with indwelling foley  Please exercise your independent, professional judgment when responding. A specific answer is not anticipated or expected.  Thank You,  Zoila Shutter RN, BSN, [place] [phone]; Cell: [phone]

## 2015-08-18 NOTE — Progress Notes (Signed)
Subjective: Patient is on IV antibiotics. Patient was evaluated by neurology. Hr blood and urine culture is pending. She is more awake. Objective: Vital signs in last 24 hours: Temp:  [97.6 F (36.4 C)-97.8 F (36.6 C)] 97.8 F (36.6 C) (03/23 2221) Pulse Rate:  [69-81] 81 (03/23 2221) Resp:  [16-17] 17 (03/23 2221) BP: (100-121)/(68-69) 121/69 mmHg (03/23 2221) SpO2:  [94 %-99 %] 99 % (03/23 2221) Weight change:  Last BM Date: 08/16/15  Intake/Output from previous day: 03/23 0701 - 03/24 0700 In: 1940 [P.O.:690; I.V.:1200; IV Piggyback:50] Out: 900 [Urine:900]  PHYSICAL EXAM General appearance: no distress and slowed mentation Resp: clear to auscultation bilaterally Cardio: S1, S2 normal GI: soft, non-tender; bowel sounds normal; no masses,  no organomegaly Extremities: paraplegic  Lab Results:  Results for orders placed or performed during the hospital encounter of 08/16/15 (from the past 48 hour(s))  I-Stat CG4 Lactic Acid, ED     Status: None   Collection Time: 08/16/15 12:46 PM  Result Value Ref Range   Lactic Acid, Venous 1.08 0.5 - 2.0 mmol/L  CBC with Differential/Platelet     Status: Abnormal   Collection Time: 08/16/15  1:04 PM  Result Value Ref Range   WBC 8.8 4.0 - 10.5 K/uL   RBC 3.36 (L) 3.87 - 5.11 MIL/uL   Hemoglobin 10.1 (L) 12.0 - 15.0 g/dL   HCT 30.4 (L) 36.0 - 46.0 %   MCV 90.5 78.0 - 100.0 fL   MCH 30.1 26.0 - 34.0 pg   MCHC 33.2 30.0 - 36.0 g/dL   RDW 14.2 11.5 - 15.5 %   Platelets 326 150 - 400 K/uL   Neutrophils Relative % 66 %   Neutro Abs 5.8 1.7 - 7.7 K/uL   Lymphocytes Relative 23 %   Lymphs Abs 2.0 0.7 - 4.0 K/uL   Monocytes Relative 6 %   Monocytes Absolute 0.6 0.1 - 1.0 K/uL   Eosinophils Relative 4 %   Eosinophils Absolute 0.3 0.0 - 0.7 K/uL   Basophils Relative 1 %   Basophils Absolute 0.1 0.0 - 0.1 K/uL  Comprehensive metabolic panel     Status: Abnormal   Collection Time: 08/16/15  1:04 PM  Result Value Ref Range   Sodium  138 135 - 145 mmol/L   Potassium 3.8 3.5 - 5.1 mmol/L   Chloride 107 101 - 111 mmol/L   CO2 25 22 - 32 mmol/L   Glucose, Bld 106 (H) 65 - 99 mg/dL   BUN 10 6 - 20 mg/dL   Creatinine, Ser 0.90 0.44 - 1.00 mg/dL   Calcium 7.7 (L) 8.9 - 10.3 mg/dL   Total Protein 5.7 (L) 6.5 - 8.1 g/dL   Albumin 1.7 (L) 3.5 - 5.0 g/dL   AST 28 15 - 41 U/L   ALT 23 14 - 54 U/L   Alkaline Phosphatase 115 38 - 126 U/L   Total Bilirubin 0.3 0.3 - 1.2 mg/dL   GFR calc non Af Amer >60 >60 mL/min   GFR calc Af Amer >60 >60 mL/min    Comment: (NOTE) The eGFR has been calculated using the CKD EPI equation. This calculation has not been validated in all clinical situations. eGFR's persistently <60 mL/min signify possible Chronic Kidney Disease.    Anion gap 6 5 - 15  Blood culture (routine x 2)     Status: None (Preliminary result)   Collection Time: 08/16/15  1:06 PM  Result Value Ref Range   Specimen Description BLOOD LEFT ANTECUBITAL  Special Requests      BOTTLES DRAWN AEROBIC AND ANAEROBIC AEB=10CC ANA=7CC   Culture NO GROWTH < 24 HOURS    Report Status PENDING   Urinalysis, Routine w reflex microscopic (not at [facility])     Status: Abnormal   Collection Time: 08/16/15  1:06 PM  Result Value Ref Range   Color, Urine YELLOW YELLOW   APPearance CLEAR CLEAR   Specific Gravity, Urine 1.015 1.005 - 1.030   pH 8.5 (H) 5.0 - 8.0   Glucose, UA NEGATIVE NEGATIVE mg/dL   Hgb urine dipstick NEGATIVE NEGATIVE   Bilirubin Urine NEGATIVE NEGATIVE   Ketones, ur NEGATIVE NEGATIVE mg/dL   Protein, ur >300 (A) NEGATIVE mg/dL   Nitrite NEGATIVE NEGATIVE   Leukocytes, UA MODERATE (A) NEGATIVE  Urine culture     Status: None   Collection Time: 08/16/15  1:06 PM  Result Value Ref Range   Specimen Description URINE, CATHETERIZED    Special Requests NONE    Culture      MULTIPLE SPECIES PRESENT, SUGGEST RECOLLECTION Performed at [facility]    Report Status 08/17/2015 FINAL   Urine microscopic-add on      Status: Abnormal   Collection Time: 08/16/15  1:06 PM  Result Value Ref Range   Squamous Epithelial / LPF TOO NUMEROUS TO COUNT (A) NONE SEEN   WBC, UA TOO NUMEROUS TO COUNT 0 - 5 WBC/hpf   RBC / HPF 0-5 0 - 5 RBC/hpf   Bacteria, UA MANY (A) NONE SEEN  Urine rapid drug screen (hosp performed)     Status: None   Collection Time: 08/16/15  1:11 PM  Result Value Ref Range   Opiates NONE DETECTED NONE DETECTED   Cocaine NONE DETECTED NONE DETECTED   Benzodiazepines NONE DETECTED NONE DETECTED   Amphetamines NONE DETECTED NONE DETECTED   Tetrahydrocannabinol NONE DETECTED NONE DETECTED   Barbiturates NONE DETECTED NONE DETECTED    Comment:        DRUG SCREEN FOR MEDICAL PURPOSES ONLY.  IF CONFIRMATION IS NEEDED FOR ANY PURPOSE, NOTIFY LAB WITHIN 5 DAYS.        LOWEST DETECTABLE LIMITS FOR URINE DRUG SCREEN Drug Class       Cutoff (ng/mL) Amphetamine      1000 Barbiturate      200 Benzodiazepine   845 Tricyclics       364 Opiates          300 Cocaine          300 THC              50   Blood culture (routine x 2)     Status: None (Preliminary result)   Collection Time: 08/16/15  1:24 PM  Result Value Ref Range   Specimen Description BLOOD LEFT ANTECUBITAL    Special Requests BOTTLES DRAWN AEROBIC ONLY 6CC    Culture NO GROWTH < 24 HOURS    Report Status PENDING   I-Stat CG4 Lactic Acid, ED     Status: Abnormal   Collection Time: 08/16/15  3:58 PM  Result Value Ref Range   Lactic Acid, Venous 0.42 (L) 0.5 - 2.0 mmol/L  Comprehensive metabolic panel     Status: Abnormal   Collection Time: 08/17/15  4:52 AM  Result Value Ref Range   Sodium 144 135 - 145 mmol/L   Potassium 3.8 3.5 - 5.1 mmol/L   Chloride 115 (H) 101 - 111 mmol/L   CO2 24 22 - 32 mmol/L  Glucose, Bld 88 65 - 99 mg/dL   BUN 8 6 - 20 mg/dL   Creatinine, Ser 0.85 0.44 - 1.00 mg/dL   Calcium 7.2 (L) 8.9 - 10.3 mg/dL   Total Protein 4.2 (L) 6.5 - 8.1 g/dL   Albumin 1.3 (L) 3.5 - 5.0 g/dL   AST 18 15 - 41 U/L    ALT 16 14 - 54 U/L   Alkaline Phosphatase 83 38 - 126 U/L   Total Bilirubin 0.4 0.3 - 1.2 mg/dL   GFR calc non Af Amer >60 >60 mL/min   GFR calc Af Amer >60 >60 mL/min    Comment: (NOTE) The eGFR has been calculated using the CKD EPI equation. This calculation has not been validated in all clinical situations. eGFR's persistently <60 mL/min signify possible Chronic Kidney Disease.    Anion gap 5 5 - 15  CBC     Status: Abnormal   Collection Time: 08/17/15  4:52 AM  Result Value Ref Range   WBC 5.7 4.0 - 10.5 K/uL   RBC 2.68 (L) 3.87 - 5.11 MIL/uL   Hemoglobin 7.9 (L) 12.0 - 15.0 g/dL    Comment: DELTA CHECK NOTED RESULT REPEATED AND VERIFIED    HCT 24.7 (L) 36.0 - 46.0 %   MCV 92.2 78.0 - 100.0 fL   MCH 29.5 26.0 - 34.0 pg   MCHC 32.0 30.0 - 36.0 g/dL   RDW 14.5 11.5 - 15.5 %   Platelets 277 150 - 400 K/uL    ABGS No results for input(s): PHART, PO2ART, TCO2, HCO3 in the last 72 hours.  Invalid input(s): PCO2 CULTURES Recent Results (from the past 240 hour(s))  Blood culture (routine x 2)     Status: None (Preliminary result)   Collection Time: 08/16/15  1:06 PM  Result Value Ref Range Status   Specimen Description BLOOD LEFT ANTECUBITAL  Final   Special Requests   Final    BOTTLES DRAWN AEROBIC AND ANAEROBIC AEB=10CC ANA=7CC   Culture NO GROWTH < 24 HOURS  Final   Report Status PENDING  Incomplete  Urine culture     Status: None   Collection Time: 08/16/15  1:06 PM  Result Value Ref Range Status   Specimen Description URINE, CATHETERIZED  Final   Special Requests NONE  Final   Culture   Final    MULTIPLE SPECIES PRESENT, SUGGEST RECOLLECTION Performed at [facility]    Report Status 08/17/2015 FINAL  Final  Blood culture (routine x 2)     Status: None (Preliminary result)   Collection Time: 08/16/15  1:24 PM  Result Value Ref Range Status   Specimen Description BLOOD LEFT ANTECUBITAL  Final   Special Requests BOTTLES DRAWN AEROBIC ONLY 6CC  Final    Culture NO GROWTH < 24 HOURS  Final   Report Status PENDING  Incomplete   Studies/Results: Ct Head Wo Contrast  08/16/2015  CLINICAL DATA:  Lethargy with decreased level consciousness. EXAM: CT HEAD WITHOUT CONTRAST TECHNIQUE: Contiguous axial images were obtained from the base of the skull through the vertex without intravenous contrast. COMPARISON:  05/16/2015. FINDINGS: No evidence for acute infarction, hemorrhage, mass lesion, hydrocephalus, or extra-axial fluid. 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 Slight premature for age global atrophy is also noted. Calvarium is intact. No sinus or mastoid disease. IMPRESSION: Chronic changes as described. No acute intracranial abnormalities seen. Electronically Signed   By: Staci Righter M.D.   On: 08/16/2015 13:56   Dg Chest  Portable 1 View  08/16/2015  CLINICAL DATA:  Weakness EXAM: PORTABLE CHEST 1 VIEW COMPARISON:  05/15/2015 FINDINGS: Cardiac shadow is mildly enlarged. Postsurgical changes in the thoracic spine are noted. The lungs are clear bilaterally. No focal infiltrate is seen. IMPRESSION: No active disease. Electronically Signed   By: Inez Catalina M.D.   On: 08/16/2015 13:09    Medications: I have reviewed the patient's current medications.  Assesment:   Principal Problem:   Acute encephalopathy Active Problems:   Paraplegia (HCC)   Suprapubic catheter (HCC)   Seizure disorder (HCC)   UTI (lower urinary tract infection)   Severe protein-calorie malnutrition (HCC)   Altered mental status    Plan:  Medications reviewed Neurology consultappreciated Wound consult appreciated Continue IVantibiotics Will follow urine culture result    LOS: 2 days   [name] 08/18/2015, 8:26 AM

## 2015-08-19 LAB — BASIC METABOLIC PANEL
Anion gap: 3 — ABNORMAL LOW (ref 5–15)
BUN: 8 mg/dL (ref 6–20)
CALCIUM: 7.1 mg/dL — AB (ref 8.9–10.3)
CO2: 22 mmol/L (ref 22–32)
CREATININE: 1.06 mg/dL — AB (ref 0.44–1.00)
Chloride: 113 mmol/L — ABNORMAL HIGH (ref 101–111)
GFR calc non Af Amer: >60 mL/min (ref >60)
Glucose, Bld: 108 mg/dL — ABNORMAL HIGH (ref 65–99)
Potassium: 3.9 mmol/L (ref 3.5–5.1)
SODIUM: 138 mmol/L (ref 135–145)

## 2015-08-19 LAB — CBC
HCT: 27.3 % — ABNORMAL LOW (ref 36.0–46.0)
Hemoglobin: 8.9 g/dL — ABNORMAL LOW (ref 12.0–15.0)
MCH: 29.8 pg (ref 26.0–34.0)
MCHC: 32.6 g/dL (ref 30.0–36.0)
MCV: 91.3 fL (ref 78.0–100.0)
Platelets: 282 10*3/uL (ref 150–400)
RBC: 2.99 MIL/uL — ABNORMAL LOW (ref 3.87–5.11)
RDW: 14.1 % (ref 11.5–15.5)
WBC: 7 10*3/uL (ref 4.0–10.5)

## 2015-08-19 MED ORDER — CEFUROXIME AXETIL 500 MG PO TABS: 500.0000 mg | ORAL_TABLET | Freq: Two times a day (BID) | ORAL | Status: DC

## 2015-08-19 NOTE — Progress Notes (Signed)
Subjective: She feels better. She is no longer encephalopathic. Her urine shows multiple organisms but she has improved on current antibiotics she says she wants to go home  Objective: Vital signs in last 24 hours: Temp:  [98 F (36.7 C)-98.5 F (36.9 C)] 98.5 F (36.9 C) (03/25 1000) Pulse Rate:  [72-91] 91 (03/25 1000) Resp:  [18] 18 (03/25 1000) BP: (127-154)/(76-83) 144/83 mmHg (03/25 1000) SpO2:  [99 %-100 %] 99 % (03/25 1000) Weight change:  Last BM Date: 08/18/15 (patients said she "thinks" she had one today)  Intake/Output from previous day: 03/24 0701 - 03/25 0700 In: 3 [I.V.:3] Out: 400 [Urine:400]  PHYSICAL EXAM General appearance: alert, cooperative and no distress Resp: clear to auscultation bilaterally Cardio: regular rate and rhythm, S1, S2 normal, no murmur, click, rub or gallop GI: soft, non-tender; bowel sounds normal; no masses,  no organomegaly Extremities: Wasting of lower extremities  Lab Results:  Results for orders placed or performed during the hospital encounter of 08/16/15 (from the past 48 hour(s))  CBC     Status: Abnormal   Collection Time: 08/19/15  5:47 AM  Result Value Ref Range   WBC 7.0 4.0 - 10.5 K/uL   RBC 2.99 (L) 3.87 - 5.11 MIL/uL   Hemoglobin 8.9 (L) 12.0 - 15.0 g/dL   HCT 27.3 (L) 36.0 - 46.0 %   MCV 91.3 78.0 - 100.0 fL   MCH 29.8 26.0 - 34.0 pg   MCHC 32.6 30.0 - 36.0 g/dL   RDW 14.1 11.5 - 15.5 %   Platelets 282 150 - 400 K/uL  Basic metabolic panel     Status: Abnormal   Collection Time: 08/19/15  5:47 AM  Result Value Ref Range   Sodium 138 135 - 145 mmol/L   Potassium 3.9 3.5 - 5.1 mmol/L   Chloride 113 (H) 101 - 111 mmol/L   CO2 22 22 - 32 mmol/L   Glucose, Bld 108 (H) 65 - 99 mg/dL   BUN 8 6 - 20 mg/dL   Creatinine, Ser 1.06 (H) 0.44 - 1.00 mg/dL   Calcium 7.1 (L) 8.9 - 10.3 mg/dL   GFR calc non Af Amer >60 >60 mL/min   GFR calc Af Amer >60 >60 mL/min    Comment: (NOTE) The eGFR has been calculated using the  CKD EPI equation. This calculation has not been validated in all clinical situations. eGFR's persistently <60 mL/min signify possible Chronic Kidney Disease.    Anion gap 3 (L) 5 - 15    ABGS No results for input(s): PHART, PO2ART, TCO2, HCO3 in the last 72 hours.  Invalid input(s): PCO2 CULTURES Recent Results (from the past 240 hour(s))  Blood culture (routine x 2)     Status: None (Preliminary result)   Collection Time: 08/16/15  1:06 PM  Result Value Ref Range Status   Specimen Description BLOOD LEFT ANTECUBITAL  Final   Special Requests   Final    BOTTLES DRAWN AEROBIC AND ANAEROBIC AEB=10CC ANA=7CC   Culture NO GROWTH 3 DAYS  Final   Report Status PENDING  Incomplete  Urine culture     Status: None   Collection Time: 08/16/15  1:06 PM  Result Value Ref Range Status   Specimen Description URINE, CATHETERIZED  Final   Special Requests NONE  Final   Culture   Final    MULTIPLE SPECIES PRESENT, SUGGEST RECOLLECTION Performed at [facility]    Report Status 08/17/2015 FINAL  Final  Blood culture (routine x 2)  Status: None (Preliminary result)   Collection Time: 08/16/15  1:24 PM  Result Value Ref Range Status   Specimen Description BLOOD LEFT ANTECUBITAL  Final   Special Requests BOTTLES DRAWN AEROBIC ONLY 6CC  Final   Culture NO GROWTH 3 DAYS  Final   Report Status PENDING  Incomplete   Studies/Results: No results found.  Medications:  Prior to Admission:  Prescriptions prior to admission  Medication Sig Dispense Refill Last Dose  . baclofen (LIORESAL) 20 MG tablet Take 40 mg by mouth 3 (three) times daily.   08/15/2015 at Unknown time  . cloNIDine (CATAPRES) 0.2 MG tablet Take 0.2 mg by mouth 3 (three) times daily.    08/15/2015 at Unknown time  . desmopressin (DDAVP) 0.2 MG tablet Take 200 mcg by mouth at bedtime.   08/15/2015 at Unknown time  . docusate sodium (COLACE) 100 MG capsule Take 100 mg by mouth at bedtime.   08/15/2015 at Unknown time  .  fesoterodine (TOVIAZ) 8 MG TB24 tablet Take 8 mg by mouth daily.   08/15/2015 at Unknown time  . furosemide (LASIX) 20 MG tablet Take 20 mg by mouth daily.   08/15/2015 at Unknown time  . levETIRAcetam (KEPPRA) 500 MG tablet Take 3 tablets (1,500 mg total) by mouth 2 (two) times daily. (Patient taking differently: Take 1,000 mg by mouth 3 (three) times daily. )   08/15/2015 at Unknown time  . metoprolol tartrate (LOPRESSOR) 25 MG tablet Take 1 tablet (25 mg total) by mouth 2 (two) times daily. 60 tablet 0 08/15/2015 at Unknown time  . Multiple Vitamins-Minerals (MULTIVITAMIN ADULT PO) Take 1 tablet by mouth daily.    08/15/2015 at Unknown time  . pantoprazole (PROTONIX) 40 MG tablet Take 1 tablet (40 mg total) by mouth 2 (two) times daily. 30 tablet 0 08/15/2015 at Unknown time  . potassium chloride SA (K-DUR,KLOR-CON) 20 MEQ tablet Take 20 mEq by mouth daily.    08/15/2015 at Unknown time  . vitamin C (ASCORBIC ACID) 500 MG tablet Take 500 mg by mouth daily.   08/15/2015 at Unknown time   Scheduled: . amLODipine  10 mg Oral Daily  . cefTRIAXone (ROCEPHIN)  IV  1 g Intravenous Q24H  . cloNIDine  0.2 mg Oral TID  . desmopressin  200 mcg Oral QHS  . docusate sodium  100 mg Oral QHS  . enoxaparin (LOVENOX) injection  40 mg Subcutaneous Q24H  . gabapentin  300 mg Oral TID  . lacosamide  100 mg Oral BID  . levETIRAcetam  1,500 mg Oral BID  . metoprolol tartrate  25 mg Oral BID  . pantoprazole  40 mg Oral BID  . sodium chloride flush  3 mL Intravenous Q12H  . vitamin C  500 mg Oral Daily   Continuous: . dextrose 5 % and 0.9% NaCl 100 mL/hr at 08/18/15 1613   PRN:  Assesment:She was admitted with acute encephalopathy related to urinary tract infection. She is much better. Her urine is growing multiple organisms but she has responded to current antibiotics. Her encephalopathy has resolved.  She has chronic suprapubic catheter.  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)   UTI (lower urinary tract infection)   Severe protein-calorie malnutrition ([place])   Altered mental status    Plan: Discharge  home    LOS: 3 days   Gracieann Stannard L 08/19/2015, 10:47 AM

## 2015-08-19 NOTE — Progress Notes (Signed)
CM faxed information to [facility] to continue home care.  Nurse notified of information faxed.  CM followed up with [facility] spoke with [facility] and fax was received for processing for services.

## 2015-08-19 NOTE — Discharge Summary (Signed)
Physician Discharge Summary  Patient ID: [name] MRN: [id number] DOB/AGE: 08-30-1965 50 y.o. [place], [place] Admit date: 08/16/2015 Discharge date: 08/19/2015    Discharge Diagnoses:   Principal Problem:   Acute encephalopathy Active Problems:   Paraplegia ([place])   Suprapubic catheter ([place])   Seizure disorder ([place])   UTI (lower urinary tract infection)   Severe protein-calorie malnutrition (HCC)   Altered mental status     Medication List    TAKE these medications        baclofen 20 MG tablet  Commonly known as:  LIORESAL  Take 40 mg by mouth 3 (three) times daily.     cefUROXime 500 MG tablet  Commonly known as:  CEFTIN  Take 1 tablet (500 mg total) by mouth 2 (two) times daily with a meal.     cloNIDine 0.2 MG tablet  Commonly known as:  CATAPRES  Take 0.2 mg by mouth 3 (three) times daily.     desmopressin 0.2 MG tablet  Commonly known as:  DDAVP  Take 200 mcg by mouth at bedtime.     docusate sodium 100 MG capsule  Commonly known as:  COLACE  Take 100 mg by mouth at bedtime.     furosemide 20 MG tablet  Commonly known as:  LASIX  Take 20 mg by mouth daily.     levETIRAcetam 500 MG tablet  Commonly known as:  KEPPRA  Take 3 tablets (1,500 mg total) by mouth 2 (two) times daily.     metoprolol tartrate 25 MG tablet  Commonly known as:  LOPRESSOR  Take 1 tablet (25 mg total) by mouth 2 (two) times daily.     MULTIVITAMIN ADULT PO  Take 1 tablet by mouth daily.     pantoprazole 40 MG tablet  Commonly known as:  PROTONIX  Take 1 tablet (40 mg total) by mouth 2 (two) times daily.     potassium chloride SA 20 MEQ tablet  Commonly known as:  K-DUR,KLOR-CON  Take 20 mEq by mouth daily.     TOVIAZ 8 MG Tb24 tablet  Generic drug:  fesoterodine  Take 8 mg by mouth daily.     vitamin C 500 MG tablet  Commonly known as:  ASCORBIC ACID  Take 500 mg by mouth daily.        Discharged Condition:Improved    Consults:  Neurology  Significant Diagnostic Studies: Ct Head Wo Contrast  08/16/2015  CLINICAL DATA:  Lethargy with decreased level consciousness. EXAM: CT HEAD WITHOUT CONTRAST TECHNIQUE: Contiguous axial images were obtained from the base of the skull through the vertex without intravenous contrast. COMPARISON:  05/16/2015. FINDINGS: No evidence for acute infarction, hemorrhage, mass lesion, hydrocephalus, or extra-axial fluid. 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 Slight premature for age global atrophy is also noted. Calvarium is intact. No sinus or mastoid disease. IMPRESSION: Chronic changes as described. No acute intracranial abnormalities seen. Electronically Signed   By: Staci Righter M.D.   On: 08/16/2015 13:56   Dg Chest Portable 1 View  08/16/2015  CLINICAL DATA:  Weakness EXAM: PORTABLE CHEST 1 VIEW COMPARISON:  05/15/2015 FINDINGS: Cardiac shadow is mildly enlarged. Postsurgical changes in the thoracic spine are noted. The lungs are clear bilaterally. No focal infiltrate is seen. IMPRESSION: No active disease. Electronically Signed   By: Inez Catalina M.D.   On: 08/16/2015 13:09    Lab Results: Basic Metabolic Panel:  Recent Labs  08/17/15 0452 08/19/15 0547  NA 144 138  K 3.8 3.9  CL 115* 113*  CO2 24 22  GLUCOSE 88 108*  BUN 8 8  CREATININE 0.85 1.06*  CALCIUM 7.2* 7.1*   Liver Function Tests:  Recent Labs  08/16/15 1304 08/17/15 0452  AST 28 18  ALT 23 16  ALKPHOS 115 83  BILITOT 0.3 0.4  PROT 5.7* 4.2*  ALBUMIN 1.7* 1.3*     CBC:  Recent Labs  08/16/15 1304 08/17/15 0452 08/19/15 0547  WBC 8.8 5.7 7.0  NEUTROABS 5.8  --   --   HGB 10.1* 7.9* 8.9*  HCT 30.4* 24.7* 27.3*  MCV 90.5 92.2 91.3  PLT 326 277 282    Recent Results (from the past 240 hour(s))  Blood culture (routine x 2)     Status: None (Preliminary result)   Collection Time: 08/16/15  1:06 PM  Result Value Ref Range Status   Specimen Description BLOOD LEFT  ANTECUBITAL  Final   Special Requests   Final    BOTTLES DRAWN AEROBIC AND ANAEROBIC AEB=10CC ANA=7CC   Culture NO GROWTH 3 DAYS  Final   Report Status PENDING  Incomplete  Urine culture     Status: None   Collection Time: 08/16/15  1:06 PM  Result Value Ref Range Status   Specimen Description URINE, CATHETERIZED  Final   Special Requests NONE  Final   Culture   Final    MULTIPLE SPECIES PRESENT, SUGGEST RECOLLECTION Performed at [facility]    Report Status 08/17/2015 FINAL  Final  Blood culture (routine x 2)     Status: None (Preliminary result)   Collection Time: 08/16/15  1:24 PM  Result Value Ref Range Status   Specimen Description BLOOD LEFT ANTECUBITAL  Final   Special Requests BOTTLES DRAWN AEROBIC ONLY [place]  Final   Culture NO GROWTH 3 DAYS  Final   Report Status PENDING  Incomplete     Hospital Course: This is a 50 year old who has multiple medical problems mostly related to trauma. She has paraplegia. She has a chronic suprapubic Foley catheter. She came to the emergency department because of confusion. She was found to be encephalopathic. This is thought to be related to urinary tract infection. Her urine culture grew multiple organisms. She responded well to Rocephin and by the time of discharge was back at her baseline mental status.  Discharge Exam: Blood pressure 144/83, pulse 91, temperature 98.5 F (36.9 C), temperature source Oral, resp. rate 18, SpO2 99 %. She is awake and alert. She is oriented. Her chest is clear.  Disposition: Home on Ceftin which should cover organisms covered by Rocephin. She will have another culture done after she finishes the antibiotic. She will need home health for that.      Discharge Instructions    Discharge patient    Complete by:  As directed      Face-to-face encounter (required for Medicare/Medicaid patients)    Complete by:  As directed   I Taseen Marasigan L certify that this patient is under my care and that I, or a  nurse practitioner or physician's assistant working with me, had a face-to-face encounter that meets the physician face-to-face encounter requirements with this patient on 08/19/2015. The encounter with the patient was in whole, or in part for the following medical condition(s) which is the primary reason for home health care (List medical condition): Encephalopathy due to urinary tract infection  The encounter with the patient was in whole, or in part, for the following medical condition, which  is the primary reason for home health care:  Encephalopathy due to urinary tract infection  I certify that, based on my findings, the following services are medically necessary home health services:  Nursing  Reason for Medically Necessary Home Health Services:  Skilled Nursing- Change/Decline in Patient Status  My clinical findings support the need for the above services:  Bedbound  Further, I certify that my clinical findings support that this patient is homebound due to:  Can transfer bed to chair only     Home Health    Complete by:  As directed   To provide the following care/treatments:  RN  Please get urine culture after antibiotics are completed           Follow-up Information    Follow up with [facility].   Contact information:   [place] [address] [phone]       Signed: Alonza Bogus   08/19/2015, 10:52 AM

## 2015-08-21 LAB — CULTURE, BLOOD (ROUTINE X 2)
CULTURE: NO GROWTH
Culture: NO GROWTH

## 2015-08-29 NOTE — Discharge Summary (Signed)
NAMEDEVINNE, NEYER            ACCOUNT NO.:  [id number]  MEDICAL RECORD NO.:  [id number]  LOCATION:  [id number]                          FACILITY:  [facility]  PHYSICIAN:  Akito Boomhower L. Luan Pulling, M.D.DATE OF BIRTH:  01-Jul-1965  DATE OF ADMISSION:  08/16/2015 DATE OF DISCHARGE:  03/25/2017LH                              DISCHARGE SUMMARY   ADDENDUM:  DISCHARGE DIAGNOSES: 1. 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 2. Metabolic encephalopathy. 3. Urinary tract infection related to chronic indwelling Foley     catheter.     Jackolyn Geron L. Luan Pulling, M.D.     ELH/MEDQ  D:  08/28/2015  T:  08/29/2015  Job:  [id number]

## 2015-09-19 ENCOUNTER — Other Ambulatory Visit (INDEPENDENT_AMBULATORY_CARE_PROVIDER_SITE_OTHER): Payer: Self-pay | Admitting: Internal Medicine

## 2015-09-19 DIAGNOSIS — Z431 Encounter for attention to gastrostomy: Secondary | ICD-10-CM

## 2015-09-20 ENCOUNTER — Telehealth (INDEPENDENT_AMBULATORY_CARE_PROVIDER_SITE_OTHER): Payer: Self-pay | Admitting: Internal Medicine

## 2015-09-20 ENCOUNTER — Encounter (HOSPITAL_COMMUNITY)
Admission: RE | Disposition: A | Discharge status: Home / Self Care | Payer: Self-pay | Source: Ambulatory Visit | Attending: Internal Medicine

## 2015-09-20 ENCOUNTER — Ambulatory Visit (HOSPITAL_COMMUNITY)
Admission: RE | Admit: 2015-09-20 | Discharge: 2015-09-20 | Disposition: A | Discharge status: Home / Self Care | Payer: Medicaid Other | Source: Ambulatory Visit | Attending: Internal Medicine | Admitting: Internal Medicine

## 2015-09-20 DIAGNOSIS — Z79899 Other long term (current) drug therapy: Secondary | ICD-10-CM | POA: Insufficient documentation

## 2015-09-20 DIAGNOSIS — Z431 Encounter for attention to gastrostomy: Secondary | ICD-10-CM | POA: Insufficient documentation

## 2015-09-20 DIAGNOSIS — T182XXA Foreign body in stomach, initial encounter: Secondary | ICD-10-CM | POA: Diagnosis not present

## 2015-09-20 DIAGNOSIS — K9423 Gastrostomy malfunction: Secondary | ICD-10-CM | POA: Diagnosis not present

## 2015-09-20 DIAGNOSIS — Z8542 Personal history of malignant neoplasm of other parts of uterus: Secondary | ICD-10-CM | POA: Insufficient documentation

## 2015-09-20 DIAGNOSIS — I10 Essential (primary) hypertension: Secondary | ICD-10-CM | POA: Diagnosis not present

## 2015-09-20 DIAGNOSIS — G40909 Epilepsy, unspecified, not intractable, without status epilepticus: Secondary | ICD-10-CM | POA: Diagnosis not present

## 2015-09-20 DIAGNOSIS — Z803 Family history of malignant neoplasm of breast: Secondary | ICD-10-CM | POA: Insufficient documentation

## 2015-09-20 DIAGNOSIS — Z931 Gastrostomy status: Secondary | ICD-10-CM | POA: Diagnosis not present

## 2015-09-20 DIAGNOSIS — G8220 Paraplegia, unspecified: Secondary | ICD-10-CM | POA: Insufficient documentation

## 2015-09-20 HISTORY — PX: PERCUTANEOUS ENDOSCOPIC GASTROSTOMY (PEG) REMOVAL: SHX6018

## 2015-09-20 HISTORY — PX: ESOPHAGOGASTRODUODENOSCOPY: SHX5428

## 2015-09-20 SURGERY — EGD (ESOPHAGOGASTRODUODENOSCOPY)
Anesthesia: Moderate Sedation

## 2015-09-20 MED ORDER — MEPERIDINE HCL 25 MG/ML IJ SOLN
25.0000 mg | Freq: Once | INTRAMUSCULAR | Status: DC
  Filled 2015-09-20: qty 1

## 2015-09-20 MED ORDER — SIMETHICONE 40 MG/0.6ML PO SUSP
ORAL | Status: AC
  Filled 2015-09-20: qty 30

## 2015-09-20 MED ORDER — MEPERIDINE HCL 50 MG/ML IJ SOLN
25.0000 mg | Freq: Once | INTRAMUSCULAR | Status: AC
  Administered 2015-09-20: 25 mg via INTRAVENOUS

## 2015-09-20 MED ORDER — SILVER NITRATE-POT NITRATE 75-25 % EX MISC: Freq: Every day | CUTANEOUS | Status: DC

## 2015-09-20 MED ORDER — MEPERIDINE HCL 50 MG/ML IJ SOLN
INTRAMUSCULAR | Status: AC
  Administered 2015-09-20: 25 mg
  Filled 2015-09-20: qty 1

## 2015-09-20 MED ORDER — BUTAMBEN-TETRACAINE-BENZOCAINE 2-2-14 % EX AERO
INHALATION_SPRAY | CUTANEOUS | Status: DC | PRN
  Administered 2015-09-20: 2 via TOPICAL

## 2015-09-20 MED ORDER — STERILE WATER FOR IRRIGATION IR SOLN
Status: DC | PRN
  Administered 2015-09-20: 09:00:00

## 2015-09-20 MED ORDER — SODIUM CHLORIDE 0.9 % IV SOLN
INTRAVENOUS | Status: DC
  Administered 2015-09-20: 1000 mL via INTRAVENOUS

## 2015-09-20 MED ORDER — MIDAZOLAM HCL 5 MG/5ML IJ SOLN
INTRAMUSCULAR | Status: DC | PRN
  Administered 2015-09-20: 2 mg via INTRAVENOUS

## 2015-09-20 MED ORDER — MIDAZOLAM HCL 5 MG/5ML IJ SOLN
INTRAMUSCULAR | Status: AC
  Filled 2015-09-20: qty 5

## 2015-09-20 NOTE — H&P (Signed)
[name] is an 50 y.o. female.   Chief Complaint: Patient is here for PEG removal. HPI: Patient is 50 year old Hispanic female who is sent over by Dr. Legrand Rams for PEG removal. According to her son she had peg possibly last year and is not using anymore. Patient has been paraplegic for several years. She is cared for at home. Patient has multiple medical problems including history of seizure disorder.  Past Medical History  Diagnosis Date  . Paraplegia (lower)   . Seizure disorder ([place])   . High blood pressure   . Suprapubic catheter ([place])   . Cancer ([place])     uterine  . Seizures ([place])   . Abnormal uterine bleeding (AUB) 06/15/2014    Past Surgical History  Procedure Laterality Date  . Back surgery      Pt stated "before 2000"    Family History  Problem Relation Age of Onset  . Cancer Mother   . Hypertension Mother   . Cancer Sister     breast and then spread everywhere.  . Diabetes Paternal Grandmother   . Hypertension Paternal Grandmother    Social History:  reports that she has never smoked. She has never used smokeless tobacco. She reports that she does not drink alcohol or use illicit drugs.  Allergies:  Allergies  Allergen Reactions  . Benadryl [Diphenhydramine Hcl (Sleep)] Hives  . Quinine Derivatives Other (See Comments)    Alters mental status  . Azithromycin Itching and Rash    Medications Prior to Admission  Medication Sig Dispense Refill  . amLODipine (NORVASC) 10 MG tablet Take 10 mg by mouth daily.    . baclofen (LIORESAL) 20 MG tablet Take 40 mg by mouth 3 (three) times daily.    . cloNIDine (CATAPRES) 0.2 MG tablet Take 0.2 mg by mouth 3 (three) times daily.     Marland Kitchen desmopressin (DDAVP) 0.2 MG tablet Take 200 mcg by mouth at bedtime.    . docusate sodium (COLACE) 100 MG capsule Take 100 mg by mouth at bedtime.    . fesoterodine (TOVIAZ) 8 MG TB24 tablet Take 8 mg by mouth daily.    . furosemide (LASIX) 20 MG tablet Take 20 mg by mouth daily.    Marland Kitchen  gabapentin (NEURONTIN) 300 MG capsule Take 300 mg by mouth 3 (three) times daily.    Marland Kitchen levETIRAcetam (KEPPRA) 500 MG tablet Take 3 tablets (1,500 mg total) by mouth 2 (two) times daily. (Patient taking differently: Take 1,000 mg by mouth 3 (three) times daily. )    . metoprolol tartrate (LOPRESSOR) 25 MG tablet Take 1 tablet (25 mg total) by mouth 2 (two) times daily. 60 tablet 0  . Multiple Vitamins-Minerals (MULTIVITAMIN ADULT PO) Take 1 tablet by mouth daily.     . pantoprazole (PROTONIX) 40 MG tablet Take 1 tablet (40 mg total) by mouth 2 (two) times daily. 30 tablet 0  . potassium chloride SA (K-DUR,KLOR-CON) 20 MEQ tablet Take 20 mEq by mouth daily.     . vitamin C (ASCORBIC ACID) 500 MG tablet Take 500 mg by mouth daily.    . cefUROXime (CEFTIN) 500 MG tablet Take 1 tablet (500 mg total) by mouth 2 (two) times daily with a meal. (Patient not taking: Reported on 09/19/2015) 14 tablet 0    No results found for this or any previous visit (from the past 48 hour(s)). No results found.  ROS  Blood pressure 125/83, pulse 69, temperature 97.5 F (36.4 C), temperature source Oral, resp. rate 12,  SpO2 97 %. Physical Exam  Constitutional: She appears well-developed and well-nourished.  HENT:  Mouth/Throat: Oropharynx is clear and moist.  She has right facial weakness.  Eyes: Conjunctivae are normal. No scleral icterus.  Neck: No thyromegaly present.  Cardiovascular: Normal rate, regular rhythm and normal heart sounds.   No murmur heard. Respiratory: Effort normal and breath sounds normal.  GI:  Abdominal exam reveals gastrostomy tube in place. Tube instruction pull-type tube. She has granulation tissue at gastrostomy site. She also has suprapubic catheter. Abdomen is soft and nontender.  Musculoskeletal: She exhibits no edema.  Lymphadenopathy:    She has no cervical adenopathy.  Neurological: She is alert.  She has paraplegia.  Skin: Skin is warm and dry.      Assessment/Plan Patient is here for PEG removal as she is not using it and she has no swallowing difficulty. Will remove PEG without EGD after giving her small dose of narcotic for pain control.  Rogene Houston, MD 09/20/2015, 8:29 AM

## 2015-09-20 NOTE — Progress Notes (Signed)
I was not able to remove gastrostomy tube by pulling. Therefore proceeded with endoscopic removal.

## 2015-09-20 NOTE — Op Note (Signed)
[facility] Patient Name: [name] Procedure Date: 09/20/2015 8:49 AM MRN: [id number] Date of Birth: February 15, 1966 Attending MD: Hildred Laser , MD CSN: [id number] Age: 50 Admit Type: Outpatient Procedure:                Upper GI endoscopy. Indications:              Remove PEG tube (no longer needed) Providers:                Hildred Laser, MD, Gwenlyn Fudge, RN, Randa Spike, Technician Referring MD:             [name] Medicines:                Cetacaine spray, Meperidine 50 mg IV, Midazolam 2                            mg IV Complications:            No immediate complications. Estimated Blood Loss:     Estimated blood loss was minimal. Procedure:                Pre-Anesthesia Assessment:                           - Prior to the procedure, a History and Physical                            was performed, and patient medications and                            allergies were reviewed. The patient's tolerance of                            previous anesthesia was also reviewed. The risks                            and benefits of the procedure and the sedation                            options and risks were discussed with the patient.                            All questions were answered, and informed consent                            was obtained. Prior Anticoagulants: The patient has                            taken no previous anticoagulant or antiplatelet                            agents. ASA Grade Assessment: III - A patient with  severe systemic disease. After reviewing the risks                            and benefits, the patient was deemed in                            satisfactory condition to undergo the procedure.                           After obtaining informed consent, the endoscope was                            passed under direct vision. Throughout the                            procedure, the  patient's blood pressure, pulse, and                            oxygen saturations were monitored continuously. The                            [id number]) scope was introduced through the                            mouth, and advanced to the second part of duodenum.                            The upper GI endoscopy was accomplished without                            difficulty. The patient tolerated the procedure                            well. Scope In: 9:00:13 AM Scope Out: 9:04:19 AM Total Procedure Duration: 0 hours 4 minutes 6 seconds  Findings:      The examined esophagus was normal.      The Z-line was regular and was found 38 cm from the incisors.      A small amount of food (residue) was found in the gastric body.      There was evidence of an intact gastrostomy with a patent G-tube present       in the gastric antrum. This was characterized by the presence of no       stomal ulceration. The PEG required removal because it was no longer       necessary. The PEG was cut externally, grasped, and removed with the       scope. Removal was accomplished without difficulty.      The duodenal bulb and second portion of the duodenum were normal. Impression:               - Normal esophagus.                           - Z-line regular, 38 cm from the incisors.                           -  A small amount of food (residue) in the stomach.                           - Intact gastrostomy with a patent G-tube present                            characterized by no stomal ulceration.                           - Normal duodenal bulb and second portion of the                            duodenum.                           - The PEG was cut externally, grasped, and removed                            with the scope because it was no longer necessary.                           - No specimens collected. Moderate Sedation:      Moderate (conscious) sedation was administered by the endoscopy nurse        and supervised by the endoscopist. The following parameters were       monitored: oxygen saturation, heart rate, blood pressure, CO2       capnography and response to care. Total physician intraservice time was       9 minutes. Recommendation:           - Patient has a contact number available for                            emergencies. The signs and symptoms of potential                            delayed complications were discussed with the                            patient. Return to normal activities tomorrow.                            Written discharge instructions were provided to the                            patient.                           - Low fat diet today.                           - Continue present medications.                           - apply silver nitrate to granulation tissue daily  until resolved Procedure Code(s):        --- Professional ---                           [phone], Esophagogastroduodenoscopy, flexible,                            transoral; with removal of foreign body(s) Diagnosis Code(s):        --- Professional ---                           [id number], Foreign body in stomach, initial encounter                           Z93.1, Gastrostomy status                           K94.23, Gastrostomy malfunction                           Z43.1, Encounter for attention to gastrostomy CPT copyright 2016 [employer]. All rights reserved. The codes documented in this report are preliminary and upon coder review may  be revised to meet current compliance requirements. Hildred Laser, MD Hildred Laser, MD 09/20/2015 9:17:58 AM This report has been signed electronically. Number of Addenda: 0

## 2015-09-20 NOTE — Telephone Encounter (Signed)
Per Dr.Rehman this is okay.

## 2015-09-20 NOTE — Telephone Encounter (Signed)
[name] w/[facility] called stating that the patient refused the Silver Nitrate Sticks that Dr. Laural Golden order because insurance will not pay for them.  They have ordered some, but it will take a week to get them in.  She stated that the daughter will change her dressing daily.  She wanted to make Dr. Laural Golden aware.

## 2015-09-20 NOTE — Discharge Instructions (Signed)
Resume usual medications and low-fat diet. Silver nitrate application to granulation tissue every day until resolved. Keep gastrostomy tube site covered with sterile dressing until healed.  Gastrointestinal Endoscopy, Care After Refer to this sheet in the next few weeks. These instructions provide you with information on caring for yourself after your procedure. Your caregiver may also give you more specific instructions. Your treatment has been planned according to current medical practices, but problems sometimes occur. Call your caregiver if you have any problems or questions after your procedure. HOME CARE INSTRUCTIONS  If you were given medicine to help you relax (sedative), do not drive, operate machinery, or sign important documents for 24 hours.  Avoid alcohol and hot or warm beverages for the first 24 hours after the procedure.  Only take over-the-counter or prescription medicines for pain, discomfort, or fever as directed by your caregiver. You may resume taking your normal medicines unless your caregiver tells you otherwise. Ask your caregiver when you may resume taking medicines that may cause bleeding, such as aspirin, clopidogrel, or warfarin.  You may return to your normal diet and activities on the day after your procedure, or as directed by your caregiver. Walking may help to reduce any bloated feeling in your abdomen.  Drink enough fluids to keep your urine clear or pale yellow.  You may gargle with salt water if you have a sore throat. SEEK IMMEDIATE MEDICAL CARE IF:  You have severe nausea or vomiting.  You have severe abdominal pain, abdominal cramps that last longer than 6 hours, or abdominal swelling (distention).  You have severe shoulder or back pain.  You have trouble swallowing.  You have shortness of breath, your breathing is shallow, or you are breathing faster than normal.  You have a fever or a rapid heartbeat.  You vomit blood or material that looks  like coffee grounds.  You have bloody, black, or tarry stools. MAKE SURE YOU:  Understand these instructions.  Will watch your condition.  Will get help right away if you are not doing well or get worse.   This information is not intended to replace advice given to you by your health care provider. Make sure you discuss any questions you have with your health care provider.   Document Released: 12/26/2003 Document Revised: 06/03/2014 Document Reviewed: 08/13/2011 Elsevier Interactive Patient Education [employer].

## 2015-09-25 ENCOUNTER — Encounter (HOSPITAL_COMMUNITY): Payer: Self-pay | Admitting: Internal Medicine

## 2015-10-21 ENCOUNTER — Encounter (HOSPITAL_COMMUNITY): Payer: Self-pay | Admitting: Cardiology

## 2015-10-21 ENCOUNTER — Emergency Department (HOSPITAL_COMMUNITY): Payer: Medicaid Other

## 2015-10-21 ENCOUNTER — Emergency Department (HOSPITAL_COMMUNITY)
Admission: EM | Admit: 2015-10-21 | Discharge: 2015-10-21 | Disposition: A | Discharge status: Home / Self Care | Payer: Medicaid Other | Attending: Emergency Medicine | Admitting: Emergency Medicine

## 2015-10-21 DIAGNOSIS — Z79899 Other long term (current) drug therapy: Secondary | ICD-10-CM | POA: Diagnosis not present

## 2015-10-21 DIAGNOSIS — R52 Pain, unspecified: Secondary | ICD-10-CM

## 2015-10-21 DIAGNOSIS — M25512 Pain in left shoulder: Secondary | ICD-10-CM

## 2015-10-21 DIAGNOSIS — R42 Dizziness and giddiness: Secondary | ICD-10-CM | POA: Diagnosis not present

## 2015-10-21 DIAGNOSIS — J90 Pleural effusion, not elsewhere classified: Secondary | ICD-10-CM | POA: Insufficient documentation

## 2015-10-21 LAB — COMPREHENSIVE METABOLIC PANEL
ALT: 13 U/L — AB (ref 14–54)
AST: 15 U/L (ref 15–41)
Albumin: 2 g/dL — ABNORMAL LOW (ref 3.5–5.0)
Alkaline Phosphatase: 77 U/L (ref 38–126)
Anion gap: 4 — ABNORMAL LOW (ref 5–15)
BUN: 16 mg/dL (ref 6–20)
CHLORIDE: 111 mmol/L (ref 101–111)
CO2: 22 mmol/L (ref 22–32)
CREATININE: 1.19 mg/dL — AB (ref 0.44–1.00)
Calcium: 7.8 mg/dL — ABNORMAL LOW (ref 8.9–10.3)
GFR, EST NON AFRICAN AMERICAN: 53 mL/min — AB (ref >60)
Glucose, Bld: 113 mg/dL — ABNORMAL HIGH (ref 65–99)
POTASSIUM: 3.6 mmol/L (ref 3.5–5.1)
SODIUM: 137 mmol/L (ref 135–145)
Total Bilirubin: 0.3 mg/dL (ref 0.3–1.2)
Total Protein: 5.2 g/dL — ABNORMAL LOW (ref 6.5–8.1)

## 2015-10-21 LAB — CBC WITH DIFFERENTIAL/PLATELET
BASOS ABS: 0 10*3/uL (ref 0.0–0.1)
BASOS PCT: 0 %
EOS ABS: 0.3 10*3/uL (ref 0.0–0.7)
EOS PCT: 4 %
HCT: 28.2 % — ABNORMAL LOW (ref 36.0–46.0)
Hemoglobin: 9.3 g/dL — ABNORMAL LOW (ref 12.0–15.0)
LYMPHS PCT: 19 %
Lymphs Abs: 1.7 10*3/uL (ref 0.7–4.0)
MCH: 29.5 pg (ref 26.0–34.0)
MCHC: 33 g/dL (ref 30.0–36.0)
MCV: 89.5 fL (ref 78.0–100.0)
MONO ABS: 0.4 10*3/uL (ref 0.1–1.0)
Monocytes Relative: 5 %
Neutro Abs: 6.3 10*3/uL (ref 1.7–7.7)
Neutrophils Relative %: 72 %
PLATELETS: 251 10*3/uL (ref 150–400)
RBC: 3.15 MIL/uL — AB (ref 3.87–5.11)
RDW: 13.6 % (ref 11.5–15.5)
WBC: 8.7 10*3/uL (ref 4.0–10.5)

## 2015-10-21 LAB — TROPONIN I

## 2015-10-21 MED ORDER — SODIUM CHLORIDE 0.9 % IV BOLUS (SEPSIS)
500.0000 mL | Freq: Once | INTRAVENOUS | Status: AC
  Administered 2015-10-21: 500 mL via INTRAVENOUS

## 2015-10-21 MED ORDER — IOPAMIDOL (ISOVUE-370) INJECTION 76%
100.0000 mL | Freq: Once | INTRAVENOUS | Status: AC | PRN
  Administered 2015-10-21: 100 mL via INTRAVENOUS

## 2015-10-21 MED ORDER — SODIUM CHLORIDE 0.9 % IV SOLN: INTRAVENOUS | Status: DC

## 2015-10-21 NOTE — ED Provider Notes (Signed)
CSN: [id number]     Arrival date & time 10/21/15  1426 History   First MD Initiated Contact with Patient 10/21/15 1518     Chief Complaint  Patient presents with  . Extremity Pain     (Consider location/radiation/quality/duration/timing/severity/associated sxs/prior Treatment) Patient is a 50 y.o. female presenting with extremity pain. The history is provided by the patient.  Extremity Pain Pertinent negatives include no chest pain, no abdominal pain, no headaches and no shortness of breath.  50 year old female paraplegic. Patient with onset of left arm pain around the left shoulder and dizziness today. No anterior chest pain. No fall or injury. Shoulder pain not made worse by movement of the arm. No shortness of breath no fevers. Patient does have a suprapubic catheter in place.  Past Medical History  Diagnosis Date  . Paraplegia (lower)   . Seizure disorder ([place])   . High blood pressure   . Suprapubic catheter ([place])   . Cancer ([place])     uterine  . Seizures ([place])   . Abnormal uterine bleeding (AUB) 06/15/2014   Past Surgical History  Procedure Laterality Date  . Back surgery      Pt stated "before 2000"  . Esophagogastroduodenoscopy N/A 09/20/2015    Procedure: ESOPHAGOGASTRODUODENOSCOPY (EGD);  Surgeon: Rogene Houston, MD;  Location: [facility];  Service: Endoscopy;  Laterality: N/A;  730  . Percutaneous endoscopic gastrostomy (peg) removal N/A 09/20/2015    Procedure: PERCUTANEOUS ENDOSCOPIC GASTROSTOMY (PEG) REMOVAL;  Surgeon: Rogene Houston, MD;  Location: [facility];  Service: Endoscopy;  Laterality: N/A;   Family History  Problem Relation Age of Onset  . Cancer Mother   . Hypertension Mother   . Cancer Sister     breast and then spread everywhere.  . Diabetes Paternal Grandmother   . Hypertension Paternal Grandmother    Social History  Substance Use Topics  . Smoking status: Never Smoker   . Smokeless tobacco: Never Used  . Alcohol Use: No   OB History     Gravida Para Term Preterm AB TAB SAB Ectopic Multiple Living   4 3   1  1   3      Review of Systems  Constitutional: Negative for fever.  HENT: Negative for congestion.   Eyes: Negative for visual disturbance.  Respiratory: Negative for shortness of breath.   Cardiovascular: Negative for chest pain.  Gastrointestinal: Negative for nausea, vomiting and abdominal pain.  Musculoskeletal: Negative for neck pain.  Skin: Positive for wound. Negative for rash.  Neurological: Positive for dizziness and weakness. Negative for headaches.  Hematological: Does not bruise/bleed easily.  Psychiatric/Behavioral: Negative for confusion.      Allergies  Benadryl; Quinine derivatives; and Azithromycin  Home Medications   Prior to Admission medications   Medication Sig Start Date End Date Taking? Authorizing Provider  amLODipine (NORVASC) 10 MG tablet Take 10 mg by mouth daily.   Yes Historical Provider, MD  baclofen (LIORESAL) 10 MG tablet Take 10 mg by mouth 2 (two) times daily.   Yes Historical Provider, MD  cloNIDine (CATAPRES) 0.2 MG tablet Take 0.2 mg by mouth 3 (three) times daily.    Yes Historical Provider, MD  dantrolene (DANTRIUM) 25 MG capsule Take 50 mg by mouth 2 (two) times daily.   Yes Historical Provider, MD  desmopressin (DDAVP) 0.2 MG tablet Take 200 mcg by mouth at bedtime. 04/19/15  Yes Historical Provider, MD  docusate sodium (COLACE) 100 MG capsule Take 100 mg by mouth at bedtime.  Yes Historical Provider, MD  fesoterodine (TOVIAZ) 8 MG TB24 tablet Take 8 mg by mouth daily.   Yes Historical Provider, MD  furosemide (LASIX) 20 MG tablet Take 20 mg by mouth daily.   Yes Historical Provider, MD  gabapentin (NEURONTIN) 300 MG capsule Take 300 mg by mouth 3 (three) times daily.   Yes Historical Provider, MD  HYDROcodone-acetaminophen (NORCO/VICODIN) 5-325 MG tablet Take 1 tablet by mouth 2 (two) times daily.   Yes Historical Provider, MD  levETIRAcetam (KEPPRA) 500 MG tablet  Take 3 tablets (1,500 mg total) by mouth 2 (two) times daily. Patient taking differently: Take 1,000 mg by mouth 3 (three) times daily.  10/19/14  Yes [name], MD  Multiple Vitamins-Minerals (MULTIVITAMIN ADULT PO) Take 1 tablet by mouth daily.    Yes Historical Provider, MD  pantoprazole (PROTONIX) 40 MG tablet Take 1 tablet (40 mg total) by mouth 2 (two) times daily. 04/12/15  Yes Jola Schmidt, MD  silver nitrate applicators [id number] % applicator Apply topically daily. Patient taking differently: Apply 1 application topically daily.  09/20/15  Yes Rogene Houston, MD  vitamin C (ASCORBIC ACID) 500 MG tablet Take 500 mg by mouth daily.   Yes Historical Provider, MD   BP 166/103 mmHg  Pulse 125  Temp(Src) 97.8 F (36.6 C)  Resp 17  Ht 5\' 4"  (1.626 m)  Wt 58.968 kg  BMI 22.30 kg/m2  SpO2 99%  LMP 09/28/2015 Physical Exam  Constitutional: She is oriented to person, place, and time. She appears well-developed and well-nourished. No distress.  HENT:  Head: Normocephalic and atraumatic.  Mouth/Throat: Oropharynx is clear and moist.  Eyes: Conjunctivae and EOM are normal. Pupils are equal, round, and reactive to light.  Neck: Normal range of motion. Neck supple.  Cardiovascular: Normal rate and regular rhythm.   Pulmonary/Chest: Effort normal and breath sounds normal.  Abdominal: Soft. Bowel sounds are normal. There is no tenderness.  Suprapubic catheter in place and draining urine properly.  Musculoskeletal:  The patient is a paraplegic. Patient with a superficial skin tear on her right leg without evidence of infection. Left radial pulses 2+. Full range of motion at the fingers wrist elbow and shoulder without any increase in pain. Full range of motion at the shoulder.  Neurological: She is alert and oriented to person, place, and time. No cranial nerve deficit.  As stated patient paraplegic.  Skin: Skin is warm. No rash noted.  Nursing note and vitals reviewed.   ED Course   Procedures (including critical care time) Labs Review Labs Reviewed  CBC WITH DIFFERENTIAL/PLATELET - Abnormal; Notable for the following:    RBC 3.15 (*)    Hemoglobin 9.3 (*)    HCT 28.2 (*)    All other components within normal limits  COMPREHENSIVE METABOLIC PANEL - Abnormal; Notable for the following:    Glucose, Bld 113 (*)    Creatinine, Ser 1.19 (*)    Calcium 7.8 (*)    Total Protein 5.2 (*)    Albumin 2.0 (*)    ALT 13 (*)    GFR calc non Af Amer 53 (*)    Anion gap 4 (*)    All other components within normal limits  TROPONIN I  TROPONIN I   Results for orders placed or performed during the hospital encounter of 10/21/15  CBC with Differential/Platelet  Result Value Ref Range   WBC 8.7 4.0 - 10.5 K/uL   RBC 3.15 (L) 3.87 - 5.11 MIL/uL   Hemoglobin 9.3 (  L) 12.0 - 15.0 g/dL   HCT 28.2 (L) 36.0 - 46.0 %   MCV 89.5 78.0 - 100.0 fL   MCH 29.5 26.0 - 34.0 pg   MCHC 33.0 30.0 - 36.0 g/dL   RDW 13.6 11.5 - 15.5 %   Platelets 251 150 - 400 K/uL   Neutrophils Relative % 72 %   Neutro Abs 6.3 1.7 - 7.7 K/uL   Lymphocytes Relative 19 %   Lymphs Abs 1.7 0.7 - 4.0 K/uL   Monocytes Relative 5 %   Monocytes Absolute 0.4 0.1 - 1.0 K/uL   Eosinophils Relative 4 %   Eosinophils Absolute 0.3 0.0 - 0.7 K/uL   Basophils Relative 0 %   Basophils Absolute 0.0 0.0 - 0.1 K/uL  Comprehensive metabolic panel  Result Value Ref Range   Sodium 137 135 - 145 mmol/L   Potassium 3.6 3.5 - 5.1 mmol/L   Chloride 111 101 - 111 mmol/L   CO2 22 22 - 32 mmol/L   Glucose, Bld 113 (H) 65 - 99 mg/dL   BUN 16 6 - 20 mg/dL   Creatinine, Ser 1.19 (H) 0.44 - 1.00 mg/dL   Calcium 7.8 (L) 8.9 - 10.3 mg/dL   Total Protein 5.2 (L) 6.5 - 8.1 g/dL   Albumin 2.0 (L) 3.5 - 5.0 g/dL   AST 15 15 - 41 U/L   ALT 13 (L) 14 - 54 U/L   Alkaline Phosphatase 77 38 - 126 U/L   Total Bilirubin 0.3 0.3 - 1.2 mg/dL   GFR calc non Af Amer 53 (L) >60 mL/min   GFR calc Af Amer >60 >60 mL/min   Anion gap 4 (L) 5 -  15  Troponin I  Result Value Ref Range   Troponin I <0.03 <0.031 ng/mL  Troponin I  Result Value Ref Range   Troponin I <0.03 <0.031 ng/mL     Imaging Review Dg Chest 2 View  10/21/2015  CLINICAL DATA:  Left shoulder pain and chest pain, initial encounter EXAM: CHEST  2 VIEW COMPARISON:  None. FINDINGS: Cardiac shadow is within normal limits. Mild left basilar atelectasis is noted as well is a small left-sided pleural effusion. No acute bony abnormality is seen. Postsurgical changes in the thoracic spine are noted. IMPRESSION: Left basilar atelectasis and associated effusion. This is likely the etiology of the chest and left shoulder pain. Electronically Signed   By: Inez Catalina M.D.   On: 10/21/2015 17:19   Ct Angio Chest Pe W/cm &/or Wo Cm  10/21/2015  CLINICAL DATA:  Chest pain.  Dizziness. EXAM: CT ANGIOGRAPHY CHEST WITH CONTRAST TECHNIQUE: Multidetector CT imaging of the chest was performed using the standard protocol during bolus administration of intravenous contrast. Multiplanar CT image reconstructions and MIPs were obtained to evaluate the vascular anatomy. CONTRAST:  100 cc Isovue 370 IV. COMPARISON:  05/15/2015 chest CT angiogram. 08/16/2015 chest radiograph. FINDINGS: Mediastinum/Nodes: The study is moderate quality for the evaluation of pulmonary embolism, limited by motion degradation. There are no filling defects in the central, lobar, segmental or subsegmental pulmonary artery branches to suggest acute pulmonary embolism. Great vessels are normal in course and caliber. Mild cardiomegaly, unchanged. No pericardial fluid/thickening. Normal visualized thyroid. Normal esophagus. No pathologically enlarged axillary, mediastinal or hilar lymph nodes. Lungs/Pleura: No pneumothorax. Small layering bilateral pleural effusions, probably at least partially loculated on the right given contour irregularity superiorly, unchanged. Mild-to-moderate compressive atelectasis in the dependent lower  lobes bilaterally. Otherwise no acute consolidative airspace disease or significant  pulmonary nodules in the aerated lungs. Upper abdomen: Unremarkable. Musculoskeletal: No aggressive appearing focal osseous lesions. Stable chronic anterior T7 compression deformity with 7 mm left lateral subluxation and 8 mm anterolisthesis at T7-8. Partially visualized bilateral posterior thoracic spine fusion hardware. Review of the MIP images confirms the above findings. IMPRESSION: 1. No evidence of pulmonary embolism on this motion degraded study. 2. Stable chronic small bilateral pleural effusions, probably at least partially loculated on the right. 3. Mild-to-moderate compressive atelectasis in the dependent lower lobes. Otherwise no active pulmonary disease. 4. Stable chronic mild cardiomegaly. Electronically Signed   By: Ilona Sorrel M.D.   On: 10/21/2015 19:16   Dg Shoulder Left  10/21/2015  CLINICAL DATA:  Left shoulder pain, no known injury, initial encounter EXAM: LEFT SHOULDER - 2+ VIEW COMPARISON:  None. FINDINGS: No acute fracture or dislocation is noted. No gross soft tissue abnormality is seen. Postsurgical changes are noted in the thoracic spine. IMPRESSION: No acute abnormality noted. Electronically Signed   By: Inez Catalina M.D.   On: 10/21/2015 17:18   I have personally reviewed and evaluated these images and lab results as part of my medical decision-making.   EKG Interpretation   Date/Time:  Saturday Oct 21 2015 14:37:05 EDT Ventricular Rate:  81 PR Interval:  137 QRS Duration: 111 QT Interval:  410 QTC Calculation: 476 R Axis:   -36 Text Interpretation:  Sinus rhythm Incomplete RBBB and LAFB Low voltage,  precordial leads RSR' in V1 or V2, right VCD or RVH No significant change  since last tracing Confirmed by Casie Sturgeon  MD, Kelcey Wickstrom [phone]) on 10/21/2015  3:43:07 PM      MDM   Final diagnoses:  Shoulder pain, acute, left  Pleural effusion    Patient with an extensive evaluation  for the left shoulder pain. Chest x-ray raises concerns for pleural effusion which could cause diaphragmatic irritation and therefore pain to the left shoulder. CT angiogram to rule out a pulmonary embolus was negative for PE. And showed just stable chronic small bilateral pleural effusions. With some loculation but nothing that seemed to be new or worse. Patient workup also included concerns for an atypical cardiac event. EKG with no significant change compared to old. 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 Troponins 2 were negative. Labs without significant abnormalities.    Fredia Sorrow, MD 10/21/15 2204

## 2015-10-21 NOTE — Discharge Instructions (Signed)
Take him the pain medications you have at home as needed. Make an appointment to follow-up with your regular doctor. Today's workup without evidence of any blood clots in the lungs or pneumonia. There is a pleural effusion this may be responsible for the pain in the shoulder if the diaphragm is irritated. No other acute findings. No evidence of any cardiac event.

## 2015-10-21 NOTE — ED Notes (Signed)
Pt assisted to wheelchair, by myself and Delrae Alfred, ED tech. Patient to waiting room to wait for her daughter

## 2015-10-21 NOTE — ED Notes (Signed)
Sudden onset of left arm pain and dizziness  today.

## 2015-12-07 ENCOUNTER — Other Ambulatory Visit (HOSPITAL_COMMUNITY)
Admission: RE | Admit: 2015-12-07 | Discharge: 2015-12-07 | Disposition: A | Discharge status: Home / Self Care | Payer: Medicaid Other | Source: Ambulatory Visit | Attending: Internal Medicine | Admitting: Internal Medicine

## 2015-12-07 ENCOUNTER — Other Ambulatory Visit (HOSPITAL_COMMUNITY): Payer: Self-pay | Admitting: Psychiatry

## 2015-12-07 DIAGNOSIS — N390 Urinary tract infection, site not specified: Secondary | ICD-10-CM | POA: Insufficient documentation

## 2015-12-07 DIAGNOSIS — N319 Neuromuscular dysfunction of bladder, unspecified: Secondary | ICD-10-CM

## 2015-12-07 LAB — URINE MICROSCOPIC-ADD ON

## 2015-12-07 LAB — URINALYSIS, ROUTINE W REFLEX MICROSCOPIC
BILIRUBIN URINE: NEGATIVE
GLUCOSE, UA: 100 mg/dL — AB
KETONES UR: NEGATIVE mg/dL
Nitrite: POSITIVE — AB
PH: 6 (ref 5.0–8.0)
Specific Gravity, Urine: 1.015 (ref 1.005–1.030)

## 2015-12-08 LAB — URINE CULTURE

## 2015-12-21 ENCOUNTER — Other Ambulatory Visit (HOSPITAL_COMMUNITY): Payer: Self-pay | Admitting: Internal Medicine

## 2015-12-21 ENCOUNTER — Ambulatory Visit (HOSPITAL_COMMUNITY)
Admission: RE | Admit: 2015-12-21 | Discharge: 2015-12-21 | Disposition: A | Discharge status: Home / Self Care | Payer: Medicaid Other | Source: Ambulatory Visit | Attending: Psychiatry | Admitting: Psychiatry

## 2015-12-21 DIAGNOSIS — M5124 Other intervertebral disc displacement, thoracic region: Secondary | ICD-10-CM | POA: Diagnosis not present

## 2015-12-21 DIAGNOSIS — Z981 Arthrodesis status: Secondary | ICD-10-CM | POA: Insufficient documentation

## 2015-12-21 DIAGNOSIS — J90 Pleural effusion, not elsewhere classified: Secondary | ICD-10-CM | POA: Diagnosis not present

## 2015-12-21 DIAGNOSIS — Z9889 Other specified postprocedural states: Secondary | ICD-10-CM | POA: Insufficient documentation

## 2015-12-21 DIAGNOSIS — G839 Paralytic syndrome, unspecified: Secondary | ICD-10-CM | POA: Insufficient documentation

## 2015-12-21 DIAGNOSIS — N319 Neuromuscular dysfunction of bladder, unspecified: Secondary | ICD-10-CM | POA: Insufficient documentation

## 2015-12-21 DIAGNOSIS — Z1231 Encounter for screening mammogram for malignant neoplasm of breast: Secondary | ICD-10-CM

## 2015-12-21 MED ORDER — GADOBENATE DIMEGLUMINE 529 MG/ML IV SOLN
12.0000 mL | Freq: Once | INTRAVENOUS | Status: AC | PRN
  Administered 2015-12-21: 12 mL via INTRAVENOUS

## 2016-01-01 ENCOUNTER — Ambulatory Visit (HOSPITAL_COMMUNITY): Payer: Medicaid Other

## 2016-01-02 ENCOUNTER — Other Ambulatory Visit (HOSPITAL_COMMUNITY)
Admission: AD | Admit: 2016-01-02 | Discharge: 2016-01-02 | Disposition: A | Discharge status: Home / Self Care | Payer: Medicaid Other | Source: Skilled Nursing Facility | Attending: Internal Medicine | Admitting: Internal Medicine

## 2016-01-02 DIAGNOSIS — N390 Urinary tract infection, site not specified: Secondary | ICD-10-CM | POA: Diagnosis not present

## 2016-01-02 LAB — URINALYSIS, ROUTINE W REFLEX MICROSCOPIC
BILIRUBIN URINE: NEGATIVE
GLUCOSE, UA: 100 mg/dL — AB
Ketones, ur: NEGATIVE mg/dL
Nitrite: NEGATIVE
PROTEIN: 100 mg/dL — AB
Specific Gravity, Urine: <1.005 — ABNORMAL LOW (ref 1.005–1.030)
pH: 5.5 (ref 5.0–8.0)

## 2016-01-02 LAB — URINE MICROSCOPIC-ADD ON

## 2016-01-04 LAB — URINE CULTURE

## 2016-05-10 ENCOUNTER — Ambulatory Visit (HOSPITAL_COMMUNITY)
Admission: RE | Admit: 2016-05-10 | Discharge: 2016-05-10 | Disposition: A | Discharge status: Home / Self Care | Payer: Medicaid Other | Source: Ambulatory Visit | Attending: Internal Medicine | Admitting: Internal Medicine

## 2016-05-10 ENCOUNTER — Other Ambulatory Visit (HOSPITAL_COMMUNITY): Payer: Self-pay | Admitting: Internal Medicine

## 2016-05-10 DIAGNOSIS — R05 Cough: Secondary | ICD-10-CM | POA: Insufficient documentation

## 2016-05-10 DIAGNOSIS — R0989 Other specified symptoms and signs involving the circulatory and respiratory systems: Secondary | ICD-10-CM

## 2016-05-10 DIAGNOSIS — R918 Other nonspecific abnormal finding of lung field: Secondary | ICD-10-CM | POA: Insufficient documentation

## 2016-05-10 DIAGNOSIS — R059 Cough, unspecified: Secondary | ICD-10-CM

## 2016-06-22 ENCOUNTER — Inpatient Hospital Stay (HOSPITAL_COMMUNITY)
Admission: EM | Admit: 2016-06-22 | Discharge: 2016-06-28 | DRG: 070 | Disposition: A | Discharge status: Home Health | Payer: Medicaid Other | Attending: Internal Medicine | Admitting: Internal Medicine

## 2016-06-22 ENCOUNTER — Encounter (HOSPITAL_COMMUNITY): Payer: Self-pay | Admitting: *Deleted

## 2016-06-22 ENCOUNTER — Emergency Department (HOSPITAL_COMMUNITY): Payer: Medicaid Other

## 2016-06-22 DIAGNOSIS — N270 Small kidney, unilateral: Secondary | ICD-10-CM | POA: Diagnosis present

## 2016-06-22 DIAGNOSIS — Z87440 Personal history of urinary (tract) infections: Secondary | ICD-10-CM | POA: Diagnosis not present

## 2016-06-22 DIAGNOSIS — I4510 Unspecified right bundle-branch block: Secondary | ICD-10-CM | POA: Diagnosis present

## 2016-06-22 DIAGNOSIS — Z9359 Other cystostomy status: Secondary | ICD-10-CM

## 2016-06-22 DIAGNOSIS — Z960 Presence of urogenital implants: Secondary | ICD-10-CM | POA: Diagnosis present

## 2016-06-22 DIAGNOSIS — G9340 Encephalopathy, unspecified: Secondary | ICD-10-CM

## 2016-06-22 DIAGNOSIS — R4182 Altered mental status, unspecified: Secondary | ICD-10-CM | POA: Diagnosis present

## 2016-06-22 DIAGNOSIS — I10 Essential (primary) hypertension: Secondary | ICD-10-CM | POA: Diagnosis present

## 2016-06-22 DIAGNOSIS — Z22322 Carrier or suspected carrier of Methicillin resistant Staphylococcus aureus: Secondary | ICD-10-CM

## 2016-06-22 DIAGNOSIS — G40909 Epilepsy, unspecified, not intractable, without status epilepticus: Secondary | ICD-10-CM | POA: Diagnosis present

## 2016-06-22 DIAGNOSIS — G9341 Metabolic encephalopathy: Secondary | ICD-10-CM | POA: Diagnosis present

## 2016-06-22 DIAGNOSIS — Y732 Prosthetic and other implants, materials and accessory gastroenterology and urology devices associated with adverse incidents: Secondary | ICD-10-CM | POA: Diagnosis present

## 2016-06-22 DIAGNOSIS — N179 Acute kidney failure, unspecified: Secondary | ICD-10-CM | POA: Diagnosis present

## 2016-06-22 DIAGNOSIS — L89153 Pressure ulcer of sacral region, stage 3: Secondary | ICD-10-CM | POA: Diagnosis present

## 2016-06-22 DIAGNOSIS — D509 Iron deficiency anemia, unspecified: Secondary | ICD-10-CM | POA: Diagnosis present

## 2016-06-22 DIAGNOSIS — N390 Urinary tract infection, site not specified: Secondary | ICD-10-CM | POA: Diagnosis present

## 2016-06-22 DIAGNOSIS — E872 Acidosis: Secondary | ICD-10-CM | POA: Diagnosis present

## 2016-06-22 DIAGNOSIS — Z8249 Family history of ischemic heart disease and other diseases of the circulatory system: Secondary | ICD-10-CM

## 2016-06-22 DIAGNOSIS — L8990 Pressure ulcer of unspecified site, unspecified stage: Secondary | ICD-10-CM | POA: Diagnosis present

## 2016-06-22 DIAGNOSIS — Y846 Urinary catheterization as the cause of abnormal reaction of the patient, or of later complication, without mention of misadventure at the time of the procedure: Secondary | ICD-10-CM | POA: Diagnosis present

## 2016-06-22 DIAGNOSIS — G8220 Paraplegia, unspecified: Secondary | ICD-10-CM | POA: Diagnosis present

## 2016-06-22 DIAGNOSIS — L89309 Pressure ulcer of unspecified buttock, unspecified stage: Secondary | ICD-10-CM | POA: Diagnosis present

## 2016-06-22 DIAGNOSIS — D638 Anemia in other chronic diseases classified elsewhere: Secondary | ICD-10-CM | POA: Diagnosis present

## 2016-06-22 LAB — BASIC METABOLIC PANEL
Anion gap: 6 (ref 5–15)
BUN: 21 mg/dL — ABNORMAL HIGH (ref 6–20)
CO2: 17 mmol/L — ABNORMAL LOW (ref 22–32)
Calcium: 9.1 mg/dL (ref 8.9–10.3)
Chloride: 122 mmol/L — ABNORMAL HIGH (ref 101–111)
Creatinine, Ser: 2.41 mg/dL — ABNORMAL HIGH (ref 0.44–1.00)
GFR calc Af Amer: 26 mL/min — ABNORMAL LOW (ref >60)
GFR calc non Af Amer: 23 mL/min — ABNORMAL LOW (ref >60)
Glucose, Bld: 110 mg/dL — ABNORMAL HIGH (ref 65–99)
Potassium: 4.2 mmol/L (ref 3.5–5.1)
Sodium: 145 mmol/L (ref 135–145)

## 2016-06-22 LAB — CBC WITH DIFFERENTIAL/PLATELET
Basophils Absolute: 0.1 10*3/uL (ref 0.0–0.1)
Basophils Relative: 1 %
Eosinophils Absolute: 0.3 10*3/uL (ref 0.0–0.7)
Eosinophils Relative: 4 %
HCT: 25 % — ABNORMAL LOW (ref 36.0–46.0)
Hemoglobin: 8.3 g/dL — ABNORMAL LOW (ref 12.0–15.0)
Lymphocytes Relative: 31 %
Lymphs Abs: 2.4 10*3/uL (ref 0.7–4.0)
MCH: 29.7 pg (ref 26.0–34.0)
MCHC: 33.2 g/dL (ref 30.0–36.0)
MCV: 89.6 fL (ref 78.0–100.0)
Monocytes Absolute: 0.4 10*3/uL (ref 0.1–1.0)
Monocytes Relative: 5 %
Neutro Abs: 4.5 10*3/uL (ref 1.7–7.7)
Neutrophils Relative %: 59 %
Platelets: 190 10*3/uL (ref 150–400)
RBC: 2.79 MIL/uL — ABNORMAL LOW (ref 3.87–5.11)
RDW: 17.4 % — ABNORMAL HIGH (ref 11.5–15.5)
WBC: 7.6 10*3/uL (ref 4.0–10.5)

## 2016-06-22 LAB — BLOOD GAS, ARTERIAL
Acid-base deficit: 10.5 mmol/L — ABNORMAL HIGH (ref 0.0–2.0)
Bicarbonate: 16 mmol/L — ABNORMAL LOW (ref 20.0–28.0)
Drawn by: 23534
FIO2: 0.21
O2 Content: 21 L/min
O2 Saturation: 96.4 %
pCO2 arterial: 33.4 mmHg (ref 32.0–48.0)
pH, Arterial: 7.274 — ABNORMAL LOW (ref 7.350–7.450)
pO2, Arterial: 91.9 mmHg (ref 83.0–108.0)

## 2016-06-22 LAB — RAPID URINE DRUG SCREEN, HOSP PERFORMED
Amphetamines: NOT DETECTED
Barbiturates: NOT DETECTED
Benzodiazepines: NOT DETECTED
Cocaine: NOT DETECTED
Opiates: NOT DETECTED
Tetrahydrocannabinol: NOT DETECTED

## 2016-06-22 LAB — URINALYSIS, ROUTINE W REFLEX MICROSCOPIC
Bilirubin Urine: NEGATIVE
Glucose, UA: >=500 mg/dL — AB
Hgb urine dipstick: NEGATIVE
Ketones, ur: 5 mg/dL — AB
Nitrite: POSITIVE — AB
Protein, ur: >=300 mg/dL — AB
Specific Gravity, Urine: 1.01 (ref 1.005–1.030)
pH: 5 (ref 5.0–8.0)

## 2016-06-22 LAB — LACTIC ACID, PLASMA: Lactic Acid, Venous: 0.7 mmol/L (ref 0.5–1.9)

## 2016-06-22 LAB — CBG MONITORING, ED: Glucose-Capillary: 97 mg/dL (ref 65–99)

## 2016-06-22 LAB — TSH: TSH: 6.183 u[IU]/mL — AB (ref 0.350–4.500)

## 2016-06-22 LAB — TROPONIN I: Troponin I: <0.03 ng/mL (ref <0.03)

## 2016-06-22 LAB — AMMONIA: Ammonia: 12 umol/L (ref 9–35)

## 2016-06-22 LAB — ETHANOL: Alcohol, Ethyl (B): <5 mg/dL (ref <5)

## 2016-06-22 MED ORDER — METOPROLOL TARTRATE 25 MG PO TABS
25.0000 mg | ORAL_TABLET | Freq: Two times a day (BID) | ORAL | Status: DC
  Administered 2016-06-23 – 2016-06-28 (×11): 25 mg via ORAL
  Filled 2016-06-22 (×11): qty 1

## 2016-06-22 MED ORDER — DEXTROSE-NACL 5-0.45 % IV SOLN
INTRAVENOUS | Status: AC
  Administered 2016-06-22 – 2016-06-23 (×2): via INTRAVENOUS

## 2016-06-22 MED ORDER — HYDROCODONE-ACETAMINOPHEN 5-325 MG PO TABS
1.0000 | ORAL_TABLET | Freq: Four times a day (QID) | ORAL | Status: DC | PRN
  Administered 2016-06-23 – 2016-06-27 (×3): 1 via ORAL
  Filled 2016-06-22 (×3): qty 1

## 2016-06-22 MED ORDER — LEVETIRACETAM IN NACL 1000 MG/100ML IV SOLN
1000.0000 mg | Freq: Two times a day (BID) | INTRAVENOUS | Status: DC
  Administered 2016-06-22 – 2016-06-23 (×2): 1000 mg via INTRAVENOUS
  Filled 2016-06-22 (×5): qty 100

## 2016-06-22 MED ORDER — AMLODIPINE BESYLATE 5 MG PO TABS
10.0000 mg | ORAL_TABLET | Freq: Every day | ORAL | Status: DC
  Administered 2016-06-23 – 2016-06-28 (×6): 10 mg via ORAL
  Filled 2016-06-22 (×6): qty 2

## 2016-06-22 MED ORDER — CLONIDINE HCL 0.2 MG PO TABS
0.2000 mg | ORAL_TABLET | Freq: Three times a day (TID) | ORAL | Status: DC
  Administered 2016-06-23 – 2016-06-28 (×15): 0.2 mg via ORAL
  Filled 2016-06-22 (×16): qty 1

## 2016-06-22 MED ORDER — PIPERACILLIN-TAZOBACTAM 3.375 G IVPB 30 MIN
3.3750 g | Freq: Once | INTRAVENOUS | Status: AC
  Administered 2016-06-22: 3.375 g via INTRAVENOUS
  Filled 2016-06-22 (×3): qty 50

## 2016-06-22 MED ORDER — VANCOMYCIN HCL IN DEXTROSE 1-5 GM/200ML-% IV SOLN
1000.0000 mg | Freq: Once | INTRAVENOUS | Status: AC
  Administered 2016-06-22: 1000 mg via INTRAVENOUS
  Filled 2016-06-22 (×2): qty 200

## 2016-06-22 MED ORDER — BACLOFEN 10 MG PO TABS
10.0000 mg | ORAL_TABLET | Freq: Three times a day (TID) | ORAL | Status: DC
  Administered 2016-06-23 – 2016-06-28 (×16): 10 mg via ORAL
  Filled 2016-06-22 (×16): qty 1

## 2016-06-22 MED ORDER — DOCUSATE SODIUM 100 MG PO CAPS
100.0000 mg | ORAL_CAPSULE | Freq: Every day | ORAL | Status: DC
  Administered 2016-06-23 – 2016-06-27 (×5): 100 mg via ORAL
  Filled 2016-06-22 (×5): qty 1

## 2016-06-22 MED ORDER — LEVETIRACETAM IN NACL 1000 MG/100ML IV SOLN
1000.0000 mg | Freq: Once | INTRAVENOUS | Status: AC
  Administered 2016-06-22: 1000 mg via INTRAVENOUS
  Filled 2016-06-22: qty 100

## 2016-06-22 MED ORDER — TIZANIDINE HCL 4 MG PO TABS
4.0000 mg | ORAL_TABLET | Freq: Two times a day (BID) | ORAL | Status: DC
  Administered 2016-06-23 – 2016-06-28 (×11): 4 mg via ORAL
  Filled 2016-06-22 (×11): qty 1

## 2016-06-22 MED ORDER — SODIUM CHLORIDE 0.9 % IV BOLUS (SEPSIS)
1000.0000 mL | Freq: Once | INTRAVENOUS | Status: AC
  Administered 2016-06-22: 1000 mL via INTRAVENOUS

## 2016-06-22 MED ORDER — BISACODYL 5 MG PO TBEC: 5.0000 mg | DELAYED_RELEASE_TABLET | Freq: Every day | ORAL | Status: DC | PRN

## 2016-06-22 MED ORDER — HEPARIN SODIUM (PORCINE) 5000 UNIT/ML IJ SOLN
5000.0000 [IU] | Freq: Three times a day (TID) | INTRAMUSCULAR | Status: DC
  Administered 2016-06-22 – 2016-06-28 (×18): 5000 [IU] via SUBCUTANEOUS
  Filled 2016-06-22 (×18): qty 1

## 2016-06-22 MED ORDER — DEXTROSE 5 % IV SOLN
1.0000 g | Freq: Once | INTRAVENOUS | Status: AC
  Administered 2016-06-22: 1 g via INTRAVENOUS
  Filled 2016-06-22: qty 10

## 2016-06-22 MED ORDER — GABAPENTIN 300 MG PO CAPS
300.0000 mg | ORAL_CAPSULE | Freq: Three times a day (TID) | ORAL | Status: DC
  Administered 2016-06-23 – 2016-06-28 (×16): 300 mg via ORAL
  Filled 2016-06-22 (×16): qty 1

## 2016-06-22 MED ORDER — ACETAMINOPHEN 325 MG PO TABS: 650.0000 mg | ORAL_TABLET | Freq: Four times a day (QID) | ORAL | Status: DC | PRN
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
## 2016-06-22 NOTE — Plan of Care (Signed)
Problem: Education: Goal: Knowledge of [place] [facility] Education information/materials will improve Outcome: Completed/Met Date Met: 06/22/16 Patient lethargic, not responding, but spoke with son at bedside

## 2016-06-22 NOTE — ED Notes (Signed)
Attempted IV to left thumb but pt had moved her arm and IV came out

## 2016-06-22 NOTE — ED Triage Notes (Signed)
Reported that daughter called EMS for ams for 4 hours.  Twitching noted per [facility] and responds only to sternum rub. CBG 121

## 2016-06-22 NOTE — ED Notes (Signed)
Hospitalist in room.
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
## 2016-06-23 LAB — CBC
HEMATOCRIT: 24.7 % — AB (ref 36.0–46.0)
Hemoglobin: 8.2 g/dL — ABNORMAL LOW (ref 12.0–15.0)
MCH: 29.6 pg (ref 26.0–34.0)
MCHC: 33.2 g/dL (ref 30.0–36.0)
MCV: 89.2 fL (ref 78.0–100.0)
PLATELETS: 216 10*3/uL (ref 150–400)
RBC: 2.77 MIL/uL — AB (ref 3.87–5.11)
RDW: 17.3 % — ABNORMAL HIGH (ref 11.5–15.5)
WBC: 9.4 10*3/uL (ref 4.0–10.5)

## 2016-06-23 LAB — LACTIC ACID, PLASMA: Lactic Acid, Venous: 0.6 mmol/L (ref 0.5–1.9)

## 2016-06-23 LAB — BASIC METABOLIC PANEL
Anion gap: 6 (ref 5–15)
BUN: 21 mg/dL — ABNORMAL HIGH (ref 6–20)
CHLORIDE: 120 mmol/L — AB (ref 101–111)
CO2: 16 mmol/L — ABNORMAL LOW (ref 22–32)
Calcium: 8.3 mg/dL — ABNORMAL LOW (ref 8.9–10.3)
Creatinine, Ser: 2.34 mg/dL — ABNORMAL HIGH (ref 0.44–1.00)
GFR calc Af Amer: 27 mL/min — ABNORMAL LOW (ref >60)
GFR, EST NON AFRICAN AMERICAN: 23 mL/min — AB (ref >60)
Glucose, Bld: 110 mg/dL — ABNORMAL HIGH (ref 65–99)
POTASSIUM: 3.8 mmol/L (ref 3.5–5.1)
SODIUM: 142 mmol/L (ref 135–145)

## 2016-06-23 LAB — MRSA PCR SCREENING: MRSA by PCR: POSITIVE — AB

## 2016-06-23 MED ORDER — PIPERACILLIN-TAZOBACTAM 3.375 G IVPB
3.3750 g | Freq: Three times a day (TID) | INTRAVENOUS | Status: DC
  Administered 2016-06-23 – 2016-06-27 (×13): 3.375 g via INTRAVENOUS
  Filled 2016-06-23 (×14): qty 50

## 2016-06-23 MED ORDER — VANCOMYCIN HCL IN DEXTROSE 750-5 MG/150ML-% IV SOLN
750.0000 mg | INTRAVENOUS | Status: DC
  Administered 2016-06-23 – 2016-06-24 (×2): 750 mg via INTRAVENOUS
  Filled 2016-06-23 (×2): qty 150

## 2016-06-23 MED ORDER — LEVETIRACETAM IN NACL 1000 MG/100ML IV SOLN
1000.0000 mg | INTRAVENOUS | Status: DC
  Administered 2016-06-23 – 2016-06-28 (×10): 1000 mg via INTRAVENOUS
  Filled 2016-06-23 (×16): qty 100

## 2016-06-23 NOTE — Progress Notes (Signed)
Last few bp readings have been high. Took manual reading-162/102. Made MD aware. No new orders at this time. Will continue to monitor.

## 2016-06-23 NOTE — Progress Notes (Signed)
Pharmacy Antibiotic Note  [name] is a 51 y.o. female admitted on 06/22/2016 with wound infection.  Pharmacy has been consulted for [place] dosing.  Plan:  Vancomycin 750mg  IV q24h Check trough at steady state Zosyn 3.375gm IV q8h, EID Monitor labs, renal fxn, progress and c/s De[place]ate ABX when improved / appropriate.    Height: 5\' 4"  (162.6 cm) Weight: 134 lb 4.8 oz (60.9 kg) IBW/kg (Calculated) : 54.7  Temp (24hrs), Avg:99.4 F (37.4 C), Min:98.9 F (37.2 C), Max:100.1 F (37.8 C)   Recent Labs Lab 06/22/16 1346 06/22/16 1555 06/23/16 0600  WBC 7.6  --  9.4  CREATININE 2.41*  --  2.34*  LATICACIDVEN  --  0.7 0.6    Estimated Creatinine Clearance: 25.4 mL/min (by C-G formula based on SCr of 2.34 mg/dL (H)).    Allergies  Allergen Reactions  . Benadryl [Diphenhydramine Hcl (Sleep)] Hives  . Quinine Derivatives Other (See Comments)    Alters mental status  . Azithromycin Itching and Rash   Antimicrobials this admission: Vancomycin 1/27 >>  Zosyn 1/27 >>   Dose adjustments this admission:  Microbiology results:  BCx: pending  UCx: pending   Sputum:    MRSA PCR:   Thank you for allowing pharmacy to be a part of this patient's care.  Hart Robinsons A 06/23/2016 8:57 AM

## 2016-06-23 NOTE — Consult Note (Signed)
[facility] Nurse wound consult note Reason for Consult: Previously healed full thickness (at least a stage 3) on sacrum, left lateral malleolus and bilateral heels.  No wounds at this time.  Patient is high risk. Wound type:Pressure Pressure Injury POA: No Measurement:No open areas at this time Wound bed:N/A Drainage (amount, consistency, odor) None Periwound:Intact with evidence of previous healing ie, scarring Dressing procedure/placement/frequency: Turning and repositioning is in place as are bilateral Pressure redistribution heel boots.  It is noted that patient has a poor appetite (tech reports that she ate very little breakfast and did not eat any protein at lunch) and a dietary consult is recommended.  If you agree, please order. [facility] nursing team will not follow, but will remain available to this patient, the nursing and medical teams.  Please re-consult if needed. Thanks, Maudie Flakes, MSN, RN, [facility], [facility]  Pager# [phone]

## 2016-06-23 NOTE — Progress Notes (Signed)
Subjective: She was admitted last night with encephalopathy likely related to a urinary tract infection. Her son who is with her at bedside says that she is better but not back to baseline. She is awake and alert. She tends to repeat herself. No other new complaints. No nausea vomiting diarrhea. She has a chronic indwelling Foley catheter and has paraplegia.  Objective: Vital signs in last 24 hours: Temp:  [98.9 F (37.2 C)-100.1 F (37.8 C)] 99.6 F (37.6 C) (01/28 0451) Pulse Rate:  [66-108] 106 (01/28 1058) Resp:  [7-16] 16 (01/28 0451) BP: (112-188)/(71-104) 188/87 (01/28 1057) SpO2:  [96 %-100 %] 100 % (01/28 0451) Weight:  [59 kg (130 lb)-60.9 kg (134 lb 4.8 oz)] 60.9 kg (134 lb 4.8 oz) (01/27 1845) Weight change:     Intake/Output from previous day: 01/27 0701 - 01/28 0700 In: 973.3 [I.V.:873.3; IV Piggyback:100] Out: 125 [Urine:125]  PHYSICAL EXAM General appearance: alert and Mildly confused but much better Resp: clear to auscultation bilaterally Cardio: regular rate and rhythm, S1, S2 normal, no murmur, click, rub or gallop GI: soft, non-tender; bowel sounds normal; no masses,  no organomegaly Extremities: She has paraplegia of the legs Neurologically she is much improved. She is clearly able to protect her airway now. Skin warm and dry.  Lab Results:  Results for orders placed or performed during the hospital encounter of 06/22/16 (from the past 48 hour(s))  Urinalysis, Routine w reflex microscopic     Status: Abnormal   Collection Time: 06/22/16  1:27 PM  Result Value Ref Range   Color, Urine YELLOW YELLOW   APPearance TURBID (A) CLEAR   Specific Gravity, Urine 1.010 1.005 - 1.030   pH 5.0 5.0 - 8.0   Glucose, UA >=500 (A) NEGATIVE mg/dL   Hgb urine dipstick NEGATIVE NEGATIVE   Bilirubin Urine NEGATIVE NEGATIVE   Ketones, ur 5 (A) NEGATIVE mg/dL   Protein, ur >=300 (A) NEGATIVE mg/dL   Nitrite POSITIVE (A) NEGATIVE   Leukocytes, UA SMALL (A) NEGATIVE   RBC /  HPF 6-30 0 - 5 RBC/hpf   WBC, UA TOO NUMEROUS TO COUNT 0 - 5 WBC/hpf   Bacteria, UA MANY (A) NONE SEEN   WBC Clumps PRESENT    Budding Yeast PRESENT   Rapid urine drug screen (hospital performed)     Status: None   Collection Time: 06/22/16  1:27 PM  Result Value Ref Range   Opiates NONE DETECTED NONE DETECTED   Cocaine NONE DETECTED NONE DETECTED   Benzodiazepines NONE DETECTED NONE DETECTED   Amphetamines NONE DETECTED NONE DETECTED   Tetrahydrocannabinol NONE DETECTED NONE DETECTED   Barbiturates NONE DETECTED NONE DETECTED    Comment:        DRUG SCREEN FOR MEDICAL PURPOSES ONLY.  IF CONFIRMATION IS NEEDED FOR ANY PURPOSE, NOTIFY LAB WITHIN 5 DAYS.        LOWEST DETECTABLE LIMITS FOR URINE DRUG SCREEN Drug Class       Cutoff (ng/mL) Amphetamine      1000 Barbiturate      200 Benzodiazepine   110 Tricyclics       211 Opiates          300 Cocaine          300 THC              50   CBG monitoring, ED     Status: None   Collection Time: 06/22/16  1:33 PM  Result Value Ref Range   Glucose-Capillary 97  65 - 99 mg/dL  CBC with Differential     Status: Abnormal   Collection Time: 06/22/16  1:46 PM  Result Value Ref Range   WBC 7.6 4.0 - 10.5 K/uL   RBC 2.79 (L) 3.87 - 5.11 MIL/uL   Hemoglobin 8.3 (L) 12.0 - 15.0 g/dL   HCT 25.0 (L) 36.0 - 46.0 %   MCV 89.6 78.0 - 100.0 fL   MCH 29.7 26.0 - 34.0 pg   MCHC 33.2 30.0 - 36.0 g/dL   RDW 17.4 (H) 11.5 - 15.5 %   Platelets 190 150 - 400 K/uL   Neutrophils Relative % 59 %   Neutro Abs 4.5 1.7 - 7.7 K/uL   Lymphocytes Relative 31 %   Lymphs Abs 2.4 0.7 - 4.0 K/uL   Monocytes Relative 5 %   Monocytes Absolute 0.4 0.1 - 1.0 K/uL   Eosinophils Relative 4 %   Eosinophils Absolute 0.3 0.0 - 0.7 K/uL   Basophils Relative 1 %   Basophils Absolute 0.1 0.0 - 0.1 K/uL  Basic metabolic panel     Status: Abnormal   Collection Time: 06/22/16  1:46 PM  Result Value Ref Range   Sodium 145 135 - 145 mmol/L   Potassium 4.2 3.5 - 5.1  mmol/L   Chloride 122 (H) 101 - 111 mmol/L   CO2 17 (L) 22 - 32 mmol/L   Glucose, Bld 110 (H) 65 - 99 mg/dL   BUN 21 (H) 6 - 20 mg/dL   Creatinine, Ser 2.41 (H) 0.44 - 1.00 mg/dL   Calcium 9.1 8.9 - 10.3 mg/dL   GFR calc non Af Amer 23 (L) >60 mL/min   GFR calc Af Amer 26 (L) >60 mL/min    Comment: (NOTE) The eGFR has been calculated using the CKD EPI equation. This calculation has not been validated in all clinical situations. eGFR's persistently <60 mL/min signify possible Chronic Kidney Disease.    Anion gap 6 5 - 15  Troponin I     Status: None   Collection Time: 06/22/16  1:46 PM  Result Value Ref Range   Troponin I <0.03 <0.03 ng/mL  Ethanol     Status: None   Collection Time: 06/22/16  1:46 PM  Result Value Ref Range   Alcohol, Ethyl (B) <5 <5 mg/dL    Comment:        LOWEST DETECTABLE LIMIT FOR SERUM ALCOHOL IS 5 mg/dL FOR MEDICAL PURPOSES ONLY   Ammonia     Status: None   Collection Time: 06/22/16  1:47 PM  Result Value Ref Range   Ammonia 12 9 - 35 umol/L  TSH     Status: Abnormal   Collection Time: 06/22/16  1:47 PM  Result Value Ref Range   TSH 6.183 (H) 0.350 - 4.500 uIU/mL    Comment: Performed by a 3rd Generation assay with a functional sensitivity of <=0.01 uIU/mL.  Blood gas, arterial ([facility] & [facility] ONLY)     Status: Abnormal   Collection Time: 06/22/16  2:18 PM  Result Value Ref Range   FIO2 0.21    O2 Content 21.0 L/min   Delivery systems ROOM AIR    pH, Arterial 7.274 (L) 7.350 - 7.450   pCO2 arterial 33.4 32.0 - 48.0 mmHg   pO2, Arterial 91.9 83.0 - 108.0 mmHg   Bicarbonate 16.0 (L) 20.0 - 28.0 mmol/L   Acid-base deficit 10.5 (H) 0.0 - 2.0 mmol/L   O2 Saturation 96.4 %   Collection site LEFT RADIAL  Drawn by [phone]    Sample type ARTERIAL    Allens test (pass/fail) PASS PASS  Lactic acid, plasma     Status: None   Collection Time: 06/22/16  3:55 PM  Result Value Ref Range   Lactic Acid, Venous 0.7 0.5 - 1.9 mmol/L  MRSA PCR Screening      Status: Abnormal   Collection Time: 06/22/16  7:00 PM  Result Value Ref Range   MRSA by PCR POSITIVE (A) NEGATIVE    Comment:        The GeneXpert MRSA Assay (FDA approved for NASAL specimens only), is one component of a comprehensive MRSA colonization surveillance program. It is not intended to diagnose MRSA infection nor to guide or monitor treatment for MRSA infections. RESULT CALLED TO, READ BACK BY AND VERIFIED WITH:  VILLALOBOS,S @ 0005 ON 06/23/16 BY JUW   Lactic acid, plasma     Status: None   Collection Time: 06/23/16  6:00 AM  Result Value Ref Range   Lactic Acid, Venous 0.6 0.5 - 1.9 mmol/L  Basic metabolic panel     Status: Abnormal   Collection Time: 06/23/16  6:00 AM  Result Value Ref Range   Sodium 142 135 - 145 mmol/L   Potassium 3.8 3.5 - 5.1 mmol/L   Chloride 120 (H) 101 - 111 mmol/L   CO2 16 (L) 22 - 32 mmol/L   Glucose, Bld 110 (H) 65 - 99 mg/dL   BUN 21 (H) 6 - 20 mg/dL   Creatinine, Ser 2.34 (H) 0.44 - 1.00 mg/dL   Calcium 8.3 (L) 8.9 - 10.3 mg/dL   GFR calc non Af Amer 23 (L) >60 mL/min   GFR calc Af Amer 27 (L) >60 mL/min    Comment: (NOTE) The eGFR has been calculated using the CKD EPI equation. This calculation has not been validated in all clinical situations. eGFR's persistently <60 mL/min signify possible Chronic Kidney Disease.    Anion gap 6 5 - 15  CBC     Status: Abnormal   Collection Time: 06/23/16  6:00 AM  Result Value Ref Range   WBC 9.4 4.0 - 10.5 K/uL   RBC 2.77 (L) 3.87 - 5.11 MIL/uL   Hemoglobin 8.2 (L) 12.0 - 15.0 g/dL   HCT 24.7 (L) 36.0 - 46.0 %   MCV 89.2 78.0 - 100.0 fL   MCH 29.6 26.0 - 34.0 pg   MCHC 33.2 30.0 - 36.0 g/dL   RDW 17.3 (H) 11.5 - 15.5 %   Platelets 216 150 - 400 K/uL    ABGS  Recent Labs  06/22/16 1418  PHART 7.274*  PO2ART 91.9  HCO3 16.0*   CULTURES Recent Results (from the past 240 hour(s))  MRSA PCR Screening     Status: Abnormal   Collection Time: 06/22/16  7:00 PM  Result Value Ref  Range Status   MRSA by PCR POSITIVE (A) NEGATIVE Final    Comment:        The GeneXpert MRSA Assay (FDA approved for NASAL specimens only), is one component of a comprehensive MRSA colonization surveillance program. It is not intended to diagnose MRSA infection nor to guide or monitor treatment for MRSA infections. RESULT CALLED TO, READ BACK BY AND VERIFIED WITH:  VILLALOBOS,S @ 0005 ON 06/23/16 BY JUW    Studies/Results: Ct Head Wo Contrast  Result Date: 06/22/2016 CLINICAL DATA:  Altered mental status EXAM: CT HEAD WITHOUT CONTRAST TECHNIQUE: Contiguous axial images were obtained from the base of the skull  through the vertex without intravenous contrast. COMPARISON:  08/16/2015 FINDINGS: Brain: High posterior left frontal gliosis is stable. Stable lateral ventriculomegaly with corpus callosum thinning. No evidence of acute infarct, hemorrhage, obstructive hydrocephalus, or mass. Vascular: No hyperdense vessel or unexpected calcification. Skull: Normal. Negative for fracture or focal lesion. Sinuses/Orbits: Negative IMPRESSION: 1. No acute finding or change from prior. 2. Left posterior frontal gliosis. Electronically Signed   By: Monte Fantasia M.D.   On: 06/22/2016 15:28   Dg Chest Portable 1 View  Result Date: 06/22/2016 CLINICAL DATA:  Altered mental status EXAM: PORTABLE CHEST 1 VIEW COMPARISON:  05/10/2016 FINDINGS: Cardiomegaly with vascular congestion. No confluent opacity or effusion. No acute bony abnormality. IMPRESSION: Cardiomegaly, vascular congestion. Electronically Signed   By: Rolm Baptise M.D.   On: 06/22/2016 14:15    Medications:  Prior to Admission:  Prescriptions Prior to Admission  Medication Sig Dispense Refill Last Dose  . acetaminophen (TYLENOL) 325 MG tablet Take 650 mg by mouth every 6 (six) hours as needed.     Marland Kitchen amLODipine (NORVASC) 10 MG tablet Take 10 mg by mouth daily.   06/21/2016 at Unknown time  . baclofen (LIORESAL) 10 MG tablet Take 1 tablet by  mouth 3 (three) times daily.   06/21/2016 at Unknown time  . cloNIDine (CATAPRES) 0.2 MG tablet Take 0.2 mg by mouth 3 (three) times daily.   06/21/2016 at Unknown time  . dantrolene (DANTRIUM) 25 MG capsule Take 50 mg by mouth 2 (two) times daily.   06/21/2016 at Unknown time  . desmopressin (DDAVP) 0.2 MG tablet Take 1 tablet by mouth at bedtime.   06/21/2016 at Unknown time  . docusate sodium (COLACE) 100 MG capsule Take 100 mg by mouth at bedtime.   10/20/2015 at Unknown time  . fesoterodine (TOVIAZ) 8 MG TB24 tablet Take 8 mg by mouth daily.   06/21/2016 at Unknown time  . furosemide (LASIX) 20 MG tablet Take 20 mg by mouth daily.   06/21/2016 at Unknown time  . gabapentin (NEURONTIN) 300 MG capsule Take 300 mg by mouth 3 (three) times daily.   06/21/2016 at Unknown time  . HYDROcodone-acetaminophen (NORCO/VICODIN) 5-325 MG tablet Take 1 tablet by mouth every 6 (six) hours as needed.   06/21/2016 at Unknown time  . levETIRAcetam (KEPPRA) 1000 MG tablet Take 1,000 mg by mouth 3 (three) times daily.   06/21/2016 at Unknown time  . metoprolol tartrate (LOPRESSOR) 25 MG tablet Take 25 mg by mouth 2 (two) times daily.   06/21/2016 at 1800  . Multiple Vitamins-Minerals (MULTIVITAMIN ADULT PO) Take 1 tablet by mouth daily.    06/21/2016 at Unknown time  . nitrofurantoin, macrocrystal-monohydrate, (MACROBID) 100 MG capsule Take 100 mg by mouth 2 (two) times daily.   06/21/2016 at Unknown time  . oxybutynin (DITROPAN) 5 MG tablet Take 5 mg by mouth 3 (three) times daily as needed.   06/21/2016 at Unknown time  . pantoprazole (PROTONIX) 40 MG tablet Take 40 mg by mouth daily.   06/21/2016 at Unknown time  . polyethylene glycol powder (GLYCOLAX/MIRALAX) powder Take 17 g by mouth daily as needed.     . potassium chloride SA (K-DUR,KLOR-CON) 20 MEQ tablet Take 1 tablet by mouth daily.   06/21/2016 at Unknown time  . tiZANidine (ZANAFLEX) 4 MG tablet Take 4 mg by mouth 2 (two) times daily.   06/21/2016 at Unknown time  .  vitamin C (ASCORBIC ACID) 500 MG tablet Take 500 mg by mouth 2 (two) times daily.  06/21/2016 at Unknown time  . silver nitrate applicators 11-21 % applicator Apply topically daily. (Patient taking differently: Apply 1 application topically daily. ) 100 each 0 10/20/2015 at Unknown time   Scheduled: . amLODipine  10 mg Oral Daily  . baclofen  10 mg Oral TID  . cloNIDine  0.2 mg Oral TID  . docusate sodium  100 mg Oral QHS  . gabapentin  300 mg Oral TID  . heparin  5,000 Units Subcutaneous Q8H  . levETIRAcetam  1,000 mg Intravenous 2 times per day  . metoprolol tartrate  25 mg Oral BID  . piperacillin-tazobactam (ZOSYN)  IV  3.375 g Intravenous Q8H  . tiZANidine  4 mg Oral BID  . vancomycin  750 mg Intravenous Q24H   Continuous: . dextrose 5 % and 0.45% NaCl 100 mL/hr at 06/23/16 1057   KKO:[id number], bisacodyl, HYDROcodone-acetaminophen  Assesment:She was admitted with encephalopathy presumably from urinary tract infection. She is being treated and has improved. She was nothing by mouth because of worry about protecting her airway but that's much better Going to Go Ahead and Start Her on a Diet. Continue with All of the Other Treatments in the Meantime. Principal Problem:   Encephalopathy acute Active Problems:   Paraplegia (HCC)   Suprapubic catheter (HCC)   Decubitus ulcer of buttock   Acute renal failure (HCC)   Seizure disorder (HCC)   High blood pressure   UTI (urinary tract infection)   Pressure ulcer   Altered mental status    Plan: Advance diet. Continue treatments.    LOS: 1 day   Caison Hearn L 06/23/2016, 11:16 AM

## 2016-06-24 ENCOUNTER — Inpatient Hospital Stay (HOSPITAL_COMMUNITY): Payer: Medicaid Other

## 2016-06-24 LAB — CREATININE, URINE, RANDOM: CREATININE, URINE: 29.5 mg/dL

## 2016-06-24 LAB — URINE CULTURE

## 2016-06-24 LAB — SODIUM, URINE, RANDOM: SODIUM UR: 47 mmol/L

## 2016-06-24 MED ORDER — CHLORHEXIDINE GLUCONATE CLOTH 2 % EX PADS
6.0000 | MEDICATED_PAD | Freq: Every day | CUTANEOUS | Status: AC
Start: 2016-06-24 — End: 2016-06-28
  Administered 2016-06-25 – 2016-06-28 (×4): 6 via TOPICAL

## 2016-06-24 MED ORDER — MUPIROCIN 2 % EX OINT
1.0000 "application " | TOPICAL_OINTMENT | Freq: Two times a day (BID) | CUTANEOUS | Status: DC
  Administered 2016-06-24 – 2016-06-28 (×9): 1 via NASAL
  Filled 2016-06-24 (×2): qty 22

## 2016-06-24 MED ORDER — SODIUM CHLORIDE 0.9 % IV SOLN
INTRAVENOUS | Status: DC
  Administered 2016-06-24 – 2016-06-28 (×3): via INTRAVENOUS

## 2016-06-24 NOTE — Care Management Note (Signed)
Case Management Note  Patient Details  Name: [name] MRN: [id number] Date of Birth: 1966-04-27  Subjective/Objective:                  Pt admitted with encephalopathy. She is from home, lives with her son. Pt is paraplegic. She has all necessary DME. She has CAP aid and is active with [facility] for nursing services. Romualdo Bolk, of [facility], aware of admission and will need order to resume services at DC. Pt plans to return home with [facility] services at DC.   Action/Plan: Will cont to follow.   Expected Discharge Date:      06/29/2016            Expected Discharge Plan:  [place]  In-House Referral:  NA  Discharge planning Services  CM Consult  Post Acute Care Choice:  Home Health Choice offered to:  Patient, Adult Children  HH Arranged:  RN [facility] Agency:  [place]  Status of Service:  In process, will continue to follow  Sherald Barge, RN 06/24/2016, 3:23 PM

## 2016-06-24 NOTE — Progress Notes (Signed)
Subjective: Patient was admitted due to change in mental status. She is found to have UTI and being treated with antibiotics. Her renal function is elevated. Her mental status has improved.  Objective: Vital signs in last 24 hours: Temp:  [97.8 F (36.6 C)-99.9 F (37.7 C)] 97.8 F (36.6 C) (01/29 0602) Pulse Rate:  [65-106] 65 (01/29 0602) Resp:  [20] 20 (01/29 0602) BP: (118-188)/(69-87) 118/78 (01/29 0602) SpO2:  [100 %] 100 % (01/29 0602) Weight change:  Last BM Date: 06/21/16  Intake/Output from previous day: 01/28 0701 - 01/29 0700 In: 1475 [P.O.:240; I.V.:1185; IV Piggyback:50] Out: 2100 [Urine:2100]  PHYSICAL EXAM General appearance: alert and no distress Resp: clear to auscultation bilaterally Cardio: S1, S2 normal GI: soft, non-tender; bowel sounds normal; no masses,  no organomegaly Extremities: paraplegic  Lab Results:  Results for orders placed or performed during the hospital encounter of 06/22/16 (from the past 48 hour(s))  Urinalysis, Routine w reflex microscopic     Status: Abnormal   Collection Time: 06/22/16  1:27 PM  Result Value Ref Range   Color, Urine YELLOW YELLOW   APPearance TURBID (A) CLEAR   Specific Gravity, Urine 1.010 1.005 - 1.030   pH 5.0 5.0 - 8.0   Glucose, UA >=500 (A) NEGATIVE mg/dL   Hgb urine dipstick NEGATIVE NEGATIVE   Bilirubin Urine NEGATIVE NEGATIVE   Ketones, ur 5 (A) NEGATIVE mg/dL   Protein, ur >=300 (A) NEGATIVE mg/dL   Nitrite POSITIVE (A) NEGATIVE   Leukocytes, UA SMALL (A) NEGATIVE   RBC / HPF 6-30 0 - 5 RBC/hpf   WBC, UA TOO NUMEROUS TO COUNT 0 - 5 WBC/hpf   Bacteria, UA MANY (A) NONE SEEN   WBC Clumps PRESENT    Budding Yeast PRESENT   Rapid urine drug screen (hospital performed)     Status: None   Collection Time: 06/22/16  1:27 PM  Result Value Ref Range   Opiates NONE DETECTED NONE DETECTED   Cocaine NONE DETECTED NONE DETECTED   Benzodiazepines NONE DETECTED NONE DETECTED   Amphetamines NONE DETECTED NONE  DETECTED   Tetrahydrocannabinol NONE DETECTED NONE DETECTED   Barbiturates NONE DETECTED NONE DETECTED    Comment:        DRUG SCREEN FOR MEDICAL PURPOSES ONLY.  IF CONFIRMATION IS NEEDED FOR ANY PURPOSE, NOTIFY LAB WITHIN 5 DAYS.        LOWEST DETECTABLE LIMITS FOR URINE DRUG SCREEN Drug Class       Cutoff (ng/mL) Amphetamine      1000 Barbiturate      200 Benzodiazepine   937 Tricyclics       902 Opiates          300 Cocaine          300 THC              50   CBG monitoring, ED     Status: None   Collection Time: 06/22/16  1:33 PM  Result Value Ref Range   Glucose-Capillary 97 65 - 99 mg/dL  CBC with Differential     Status: Abnormal   Collection Time: 06/22/16  1:46 PM  Result Value Ref Range   WBC 7.6 4.0 - 10.5 K/uL   RBC 2.79 (L) 3.87 - 5.11 MIL/uL   Hemoglobin 8.3 (L) 12.0 - 15.0 g/dL   HCT 25.0 (L) 36.0 - 46.0 %   MCV 89.6 78.0 - 100.0 fL   MCH 29.7 26.0 - 34.0 pg   MCHC 33.2 30.0 -  36.0 g/dL   RDW 17.4 (H) 11.5 - 15.5 %   Platelets 190 150 - 400 K/uL   Neutrophils Relative % 59 %   Neutro Abs 4.5 1.7 - 7.7 K/uL   Lymphocytes Relative 31 %   Lymphs Abs 2.4 0.7 - 4.0 K/uL   Monocytes Relative 5 %   Monocytes Absolute 0.4 0.1 - 1.0 K/uL   Eosinophils Relative 4 %   Eosinophils Absolute 0.3 0.0 - 0.7 K/uL   Basophils Relative 1 %   Basophils Absolute 0.1 0.0 - 0.1 K/uL  Basic metabolic panel     Status: Abnormal   Collection Time: 06/22/16  1:46 PM  Result Value Ref Range   Sodium 145 135 - 145 mmol/L   Potassium 4.2 3.5 - 5.1 mmol/L   Chloride 122 (H) 101 - 111 mmol/L   CO2 17 (L) 22 - 32 mmol/L   Glucose, Bld 110 (H) 65 - 99 mg/dL   BUN 21 (H) 6 - 20 mg/dL   Creatinine, Ser 2.41 (H) 0.44 - 1.00 mg/dL   Calcium 9.1 8.9 - 10.3 mg/dL   GFR calc non Af Amer 23 (L) >60 mL/min   GFR calc Af Amer 26 (L) >60 mL/min    Comment: (NOTE) The eGFR has been calculated using the CKD EPI equation. This calculation has not been validated in all clinical  situations. eGFR's persistently <60 mL/min signify possible Chronic Kidney Disease.    Anion gap 6 5 - 15  Troponin I     Status: None   Collection Time: 06/22/16  1:46 PM  Result Value Ref Range   Troponin I <0.03 <0.03 ng/mL  Ethanol     Status: None   Collection Time: 06/22/16  1:46 PM  Result Value Ref Range   Alcohol, Ethyl (B) <5 <5 mg/dL    Comment:        LOWEST DETECTABLE LIMIT FOR SERUM ALCOHOL IS 5 mg/dL FOR MEDICAL PURPOSES ONLY   Ammonia     Status: None   Collection Time: 06/22/16  1:47 PM  Result Value Ref Range   Ammonia 12 9 - 35 umol/L  TSH     Status: Abnormal   Collection Time: 06/22/16  1:47 PM  Result Value Ref Range   TSH 6.183 (H) 0.350 - 4.500 uIU/mL    Comment: Performed by a 3rd Generation assay with a functional sensitivity of <=0.01 uIU/mL.  Blood gas, arterial ([facility] & [facility] ONLY)     Status: Abnormal   Collection Time: 06/22/16  2:18 PM  Result Value Ref Range   FIO2 0.21    O2 Content 21.0 L/min   Delivery systems ROOM AIR    pH, Arterial 7.274 (L) 7.350 - 7.450   pCO2 arterial 33.4 32.0 - 48.0 mmHg   pO2, Arterial 91.9 83.0 - 108.0 mmHg   Bicarbonate 16.0 (L) 20.0 - 28.0 mmol/L   Acid-base deficit 10.5 (H) 0.0 - 2.0 mmol/L   O2 Saturation 96.4 %   Collection site LEFT RADIAL    Drawn by [phone]    Sample type ARTERIAL    Allens test (pass/fail) PASS PASS  Lactic acid, plasma     Status: None   Collection Time: 06/22/16  3:55 PM  Result Value Ref Range   Lactic Acid, Venous 0.7 0.5 - 1.9 mmol/L  MRSA PCR Screening     Status: Abnormal   Collection Time: 06/22/16  7:00 PM  Result Value Ref Range   MRSA by PCR POSITIVE (A) NEGATIVE  Comment:        The GeneXpert MRSA Assay (FDA approved for NASAL specimens only), is one component of a comprehensive MRSA colonization surveillance program. It is not intended to diagnose MRSA infection nor to guide or monitor treatment for MRSA infections. RESULT CALLED TO, READ BACK BY AND VERIFIED  WITH:  VILLALOBOS,S @ 0005 ON 06/23/16 BY JUW   Lactic acid, plasma     Status: None   Collection Time: 06/23/16  6:00 AM  Result Value Ref Range   Lactic Acid, Venous 0.6 0.5 - 1.9 mmol/L  Basic metabolic panel     Status: Abnormal   Collection Time: 06/23/16  6:00 AM  Result Value Ref Range   Sodium 142 135 - 145 mmol/L   Potassium 3.8 3.5 - 5.1 mmol/L   Chloride 120 (H) 101 - 111 mmol/L   CO2 16 (L) 22 - 32 mmol/L   Glucose, Bld 110 (H) 65 - 99 mg/dL   BUN 21 (H) 6 - 20 mg/dL   Creatinine, Ser 2.34 (H) 0.44 - 1.00 mg/dL   Calcium 8.3 (L) 8.9 - 10.3 mg/dL   GFR calc non Af Amer 23 (L) >60 mL/min   GFR calc Af Amer 27 (L) >60 mL/min    Comment: (NOTE) The eGFR has been calculated using the CKD EPI equation. This calculation has not been validated in all clinical situations. eGFR's persistently <60 mL/min signify possible Chronic Kidney Disease.    Anion gap 6 5 - 15  CBC     Status: Abnormal   Collection Time: 06/23/16  6:00 AM  Result Value Ref Range   WBC 9.4 4.0 - 10.5 K/uL   RBC 2.77 (L) 3.87 - 5.11 MIL/uL   Hemoglobin 8.2 (L) 12.0 - 15.0 g/dL   HCT 24.7 (L) 36.0 - 46.0 %   MCV 89.2 78.0 - 100.0 fL   MCH 29.6 26.0 - 34.0 pg   MCHC 33.2 30.0 - 36.0 g/dL   RDW 17.3 (H) 11.5 - 15.5 %   Platelets 216 150 - 400 K/uL    ABGS  Recent Labs  06/22/16 1418  PHART 7.274*  PO2ART 91.9  HCO3 16.0*   CULTURES Recent Results (from the past 240 hour(s))  MRSA PCR Screening     Status: Abnormal   Collection Time: 06/22/16  7:00 PM  Result Value Ref Range Status   MRSA by PCR POSITIVE (A) NEGATIVE Final    Comment:        The GeneXpert MRSA Assay (FDA approved for NASAL specimens only), is one component of a comprehensive MRSA colonization surveillance program. It is not intended to diagnose MRSA infection nor to guide or monitor treatment for MRSA infections. RESULT CALLED TO, READ BACK BY AND VERIFIED WITH:  VILLALOBOS,S @ 0005 ON 06/23/16 BY JUW     Studies/Results: Ct Head Wo Contrast  Result Date: 06/22/2016 CLINICAL DATA:  Altered mental status EXAM: CT HEAD WITHOUT CONTRAST TECHNIQUE: Contiguous axial images were obtained from the base of the skull through the vertex without intravenous contrast. COMPARISON:  08/16/2015 FINDINGS: Brain: High posterior left frontal gliosis is stable. Stable lateral ventriculomegaly with corpus callosum thinning. No evidence of acute infarct, hemorrhage, obstructive hydrocephalus, or mass. Vascular: No hyperdense vessel or unexpected calcification. Skull: Normal. Negative for fracture or focal lesion. Sinuses/Orbits: Negative IMPRESSION: 1. No acute finding or change from prior. 2. Left posterior frontal gliosis. Electronically Signed   By: Monte Fantasia M.D.   On: 06/22/2016 15:28   Dg Chest  Portable 1 View  Result Date: 06/22/2016 CLINICAL DATA:  Altered mental status EXAM: PORTABLE CHEST 1 VIEW COMPARISON:  05/10/2016 FINDINGS: Cardiomegaly with vascular congestion. No confluent opacity or effusion. No acute bony abnormality. IMPRESSION: Cardiomegaly, vascular congestion. Electronically Signed   By: Rolm Baptise M.D.   On: 06/22/2016 14:15    Medications: I have reviewed the patient's current medications.  Assesment:  Principal Problem:   Encephalopathy acute Active Problems:   Paraplegia (HCC)   Suprapubic catheter (HCC)   Decubitus ulcer of buttock   Acute renal failure (HCC)   Seizure disorder (HCC)   High blood pressure   UTI (urinary tract infection)   Pressure ulcer   Altered mental status    Plan:  Medications reviewed Continue combinations of iv antibiotics pending culture result Will do renal ultrasound Will stat iv hydration nephology consult    LOS: 2 days   Riley Papin 06/24/2016, 8:36 AM
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
## 2016-06-25 LAB — RENAL FUNCTION PANEL
ALBUMIN: 2.1 g/dL — AB (ref 3.5–5.0)
Anion gap: 7 (ref 5–15)
BUN: 22 mg/dL — ABNORMAL HIGH (ref 6–20)
CHLORIDE: 114 mmol/L — AB (ref 101–111)
CO2: 15 mmol/L — ABNORMAL LOW (ref 22–32)
CREATININE: 2.36 mg/dL — AB (ref 0.44–1.00)
Calcium: 7.2 mg/dL — ABNORMAL LOW (ref 8.9–10.3)
GFR calc Af Amer: 27 mL/min — ABNORMAL LOW (ref >60)
GFR, EST NON AFRICAN AMERICAN: 23 mL/min — AB (ref >60)
Glucose, Bld: 104 mg/dL — ABNORMAL HIGH (ref 65–99)
Phosphorus: 4.1 mg/dL (ref 2.5–4.6)
Potassium: 4.2 mmol/L (ref 3.5–5.1)
SODIUM: 136 mmol/L (ref 135–145)

## 2016-06-25 MED ORDER — SODIUM BICARBONATE 650 MG PO TABS: 650.0000 mg | ORAL_TABLET | Freq: Two times a day (BID) | ORAL | Status: DC

## 2016-06-25 MED ORDER — SODIUM BICARBONATE 650 MG PO TABS
650.0000 mg | ORAL_TABLET | Freq: Three times a day (TID) | ORAL | Status: DC
  Administered 2016-06-25 – 2016-06-28 (×10): 650 mg via ORAL
  Filled 2016-06-25 (×10): qty 1

## 2016-06-25 NOTE — Progress Notes (Signed)
Subjective: Patient feels better. Her mental status has improved. She is being evaluated by nephrology for acute renal injury. Urine culture is growing multiple organisms.  Objective: Vital signs in last 24 hours: Temp:  [98 F (36.7 C)-98.9 F (37.2 C)] 98.9 F (37.2 C) (01/30 0631) Pulse Rate:  [62-71] 62 (01/30 0631) Resp:  [14-20] 14 (01/30 0631) BP: (117-131)/(61-79) 131/61 (01/30 0631) SpO2:  [100 %] 100 % (01/30 0631) Weight change:  Last BM Date: 06/21/16  Intake/Output from previous day: 01/29 0701 - 01/30 0700 In: 1560 [P.O.:720; I.V.:540; IV Piggyback:300] Out: 2200 [Urine:2200]  PHYSICAL EXAM General appearance: alert and no distress Resp: clear to auscultation bilaterally Cardio: S1, S2 normal GI: soft, non-tender; bowel sounds normal; no masses,  no organomegaly Extremities: paraplegic  Lab Results:  Results for orders placed or performed during the hospital encounter of 06/22/16 (from the past 48 hour(s))  Sodium, urine, random     Status: None   Collection Time: 06/24/16  4:50 PM  Result Value Ref Range   Sodium, Ur 47 mmol/L  Creatinine, urine, random     Status: None   Collection Time: 06/24/16  4:50 PM  Result Value Ref Range   Creatinine, Urine 29.50 mg/dL  Renal function panel     Status: Abnormal   Collection Time: 06/25/16  6:36 AM  Result Value Ref Range   Sodium 136 135 - 145 mmol/L   Potassium 4.2 3.5 - 5.1 mmol/L   Chloride 114 (H) 101 - 111 mmol/L   CO2 15 (L) 22 - 32 mmol/L   Glucose, Bld 104 (H) 65 - 99 mg/dL   BUN 22 (H) 6 - 20 mg/dL   Creatinine, Ser 2.36 (H) 0.44 - 1.00 mg/dL   Calcium 7.2 (L) 8.9 - 10.3 mg/dL   Phosphorus 4.1 2.5 - 4.6 mg/dL   Albumin 2.1 (L) 3.5 - 5.0 g/dL   GFR calc non Af Amer 23 (L) >60 mL/min   GFR calc Af Amer 27 (L) >60 mL/min    Comment: (NOTE) The eGFR has been calculated using the CKD EPI equation. This calculation has not been validated in all clinical situations. eGFR's persistently <60 mL/min  signify possible Chronic Kidney Disease.    Anion gap 7 5 - 15    ABGS  Recent Labs  06/22/16 1418  PHART 7.274*  PO2ART 91.9  HCO3 16.0*   CULTURES Recent Results (from the past 240 hour(s))  Urine culture     Status: Abnormal   Collection Time: 06/22/16 12:37 PM  Result Value Ref Range Status   Specimen Description URINE, CATHETERIZED  Final   Special Requests NONE  Final   Culture MULTIPLE SPECIES PRESENT, SUGGEST RECOLLECTION (A)  Final   Report Status 06/24/2016 FINAL  Final  MRSA PCR Screening     Status: Abnormal   Collection Time: 06/22/16  7:00 PM  Result Value Ref Range Status   MRSA by PCR POSITIVE (A) NEGATIVE Final    Comment:        The GeneXpert MRSA Assay (FDA approved for NASAL specimens only), is one component of a comprehensive MRSA colonization surveillance program. It is not intended to diagnose MRSA infection nor to guide or monitor treatment for MRSA infections. RESULT CALLED TO, READ BACK BY AND VERIFIED WITH:  VILLALOBOS,S @ 0005 ON 06/23/16 BY JUW    Studies/Results: US Renal  Result Date: 06/24/2016 CLINICAL DATA:  Atrophy. EXAM: RENAL / URINARY TRACT ULTRASOUND COMPLETE COMPARISON:  None. FINDINGS: Right Kidney: Length: 7.7 cm.  Irregular contour . Echogenicity within normal limits. No mass or hydronephrosis visualized. Left Kidney: Length: 10.6 cm. Echogenicity within normal limits. No mass or hydronephrosis visualized. Bladder: No bladder distention.  Suprapubic catheter present. IMPRESSION: 1.  Atrophy and scarring right kidney. 2.  Bladder is nondistended.  Suprapubic pubic catheters present. Electronically Signed   By: Marcello Moores  Register   On: 06/24/2016 10:36    Medications: I have reviewed the patient's current medications.  Assesment:  Principal Problem:   Encephalopathy acute Active Problems:   Paraplegia (HCC)   Suprapubic catheter (HCC)   Decubitus ulcer of buttock   Acute renal failure (HCC)   Seizure disorder (HCC)   High  blood pressure   UTI (urinary tract infection)   Pressure ulcer   Altered mental status    Plan:  Medications reviewed Continue iv hydration Will D/C vancomycin nephology consultappreciated    LOS: 3 days   Matrice Herro 06/25/2016, 8:33 AM

## 2016-06-25 NOTE — Progress Notes (Signed)
Subjective: Interval History: has no complaint of nausea or vomiting. Presently patient also denies any fevers chills or sweating. Overall she says she is feeling good and asking me when she can go home..  Objective: Vital signs in last 24 hours: Temp:  [98 F (36.7 C)-98.9 F (37.2 C)] 98.9 F (37.2 C) (01/30 0631) Pulse Rate:  [62-71] 62 (01/30 0631) Resp:  [14-20] 14 (01/30 0631) BP: (117-131)/(61-79) 131/61 (01/30 0631) SpO2:  [100 %] 100 % (01/30 0631) Weight change:   Intake/Output from previous day: 01/29 0701 - 01/30 0700 In: 1560 [P.O.:720; I.V.:540; IV Piggyback:300] Out: 2200 [Urine:2200] Intake/Output this shift: No intake/output data recorded.  General appearance: alert, cooperative and no distress Resp: clear to auscultation bilaterally Cardio: regular rate and rhythm, S1, S2 normal, no murmur, click, rub or gallop GI: soft, non-tender; bowel sounds normal; no masses,  no organomegaly Extremities: extremities normal, atraumatic, no cyanosis or edema  Lab Results:  Recent Labs  06/22/16 1346 06/23/16 0600  WBC 7.6 9.4  HGB 8.3* 8.2*  HCT 25.0* 24.7*  PLT 190 216   BMET:  Recent Labs  06/23/16 0600 06/25/16 0636  NA 142 136  K 3.8 4.2  CL 120* 114*  CO2 16* 15*  GLUCOSE 110* 104*  BUN 21* 22*  CREATININE 2.34* 2.36*  CALCIUM 8.3* 7.2*   No results for input(s): PTH in the last 72 hours. Iron Studies: No results for input(s): IRON, TIBC, TRANSFERRIN, FERRITIN in the last 72 hours.  Studies/Results: US Renal  Result Date: 06/24/2016 CLINICAL DATA:  Atrophy. EXAM: RENAL / URINARY TRACT ULTRASOUND COMPLETE COMPARISON:  None. FINDINGS: Right Kidney: Length: 7.7 cm. Irregular contour . Echogenicity within normal limits. No mass or hydronephrosis visualized. Left Kidney: Length: 10.6 cm. Echogenicity within normal limits. No mass or hydronephrosis visualized. Bladder: No bladder distention.  Suprapubic catheter present. IMPRESSION: 1.  Atrophy and  scarring right kidney. 2.  Bladder is nondistended.  Suprapubic pubic catheters present. Electronically Signed   By: Marcello Moores  Register   On: 06/24/2016 10:36    I have reviewed the patient's current medications.  Assessment/Plan: Problem #1 acute kidney injury: Possibly prerenal versus ATN. Patient had 2200 mL of urine output. Her renal function is still is not getting any better. Her potassium is normal. Patient with right small kidney. 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 She has also recurrent urinary tract infection. Problem #2 metabolic acidosis: Her CO2 is 15 still remains low Problem #3 anemia: Possibly iron deficiency . Anemia of chronic disease cannot be ruled out. Problem #4 UTI: Presently she is on antibiotics. Problem #5 hypertension her blood pressure is reasonably controlled Problem #6 history of seizure disorder Problem #7 paraplegia Plan: We'll start patient on sodium bicarbonate 650 mg by mouth 3 times a day 2] will continue with hydration 3] will check iron studies and renal panel in the morning.    LOS: 3 days   [name] S 06/25/2016,7:42 AM

## 2016-06-26 LAB — CBC
HCT: 23.7 % — ABNORMAL LOW (ref 36.0–46.0)
Hemoglobin: 7.7 g/dL — ABNORMAL LOW (ref 12.0–15.0)
MCH: 29.2 pg (ref 26.0–34.0)
MCHC: 32.5 g/dL (ref 30.0–36.0)
MCV: 89.8 fL (ref 78.0–100.0)
PLATELETS: 186 10*3/uL (ref 150–400)
RBC: 2.64 MIL/uL — ABNORMAL LOW (ref 3.87–5.11)
RDW: 17 % — AB (ref 11.5–15.5)
WBC: 12.7 10*3/uL — AB (ref 4.0–10.5)

## 2016-06-26 LAB — RENAL FUNCTION PANEL
ALBUMIN: 1.9 g/dL — AB (ref 3.5–5.0)
Anion gap: 8 (ref 5–15)
BUN: 21 mg/dL — AB (ref 6–20)
CO2: 15 mmol/L — ABNORMAL LOW (ref 22–32)
Calcium: 7.3 mg/dL — ABNORMAL LOW (ref 8.9–10.3)
Chloride: 115 mmol/L — ABNORMAL HIGH (ref 101–111)
Creatinine, Ser: 2.31 mg/dL — ABNORMAL HIGH (ref 0.44–1.00)
GFR calc Af Amer: 28 mL/min — ABNORMAL LOW (ref >60)
GFR, EST NON AFRICAN AMERICAN: 24 mL/min — AB (ref >60)
Glucose, Bld: 98 mg/dL (ref 65–99)
PHOSPHORUS: 4.1 mg/dL (ref 2.5–4.6)
Potassium: 3.4 mmol/L — ABNORMAL LOW (ref 3.5–5.1)
Sodium: 138 mmol/L (ref 135–145)

## 2016-06-26 LAB — IRON AND TIBC
Iron: 30 ug/dL (ref 28–170)
SATURATION RATIOS: 15 % (ref 10.4–31.8)
TIBC: 199 ug/dL — ABNORMAL LOW (ref 250–450)
UIBC: 169 ug/dL

## 2016-06-26 LAB — FERRITIN: Ferritin: 28 ng/mL (ref 11–307)

## 2016-06-26 NOTE — Progress Notes (Signed)
Subjective: Patient is resting. She is on IV antibiotics and IV fluid. Patient is gradually improving. Her renal function is slightly better. No fever or chills.  Objective: Vital signs in last 24 hours: Temp:  [97.5 F (36.4 C)-99 F (37.2 C)] 97.5 F (36.4 C) (01/31 0535) Pulse Rate:  [64-73] 64 (01/31 0535) Resp:  [15] 15 (01/31 0535) BP: (101-134)/(50-64) 134/62 (01/31 0535) SpO2:  [100 %] 100 % (01/31 0535) Weight change:  Last BM Date: 06/26/16  Intake/Output from previous day: 01/30 0701 - 01/31 0700 In: 4453.8 [P.O.:720; I.V.:3183.8; IV Piggyback:550] Out: 2000 [Urine:2000]  PHYSICAL EXAM General appearance: alert and no distress Resp: clear to auscultation bilaterally Cardio: S1, S2 normal GI: soft, non-tender; bowel sounds normal; no masses,  no organomegaly Extremities: paraplegic  Lab Results:  Results for orders placed or performed during the hospital encounter of 06/22/16 (from the past 48 hour(s))  Sodium, urine, random     Status: None   Collection Time: 06/24/16  4:50 PM  Result Value Ref Range   Sodium, Ur 47 mmol/L  Creatinine, urine, random     Status: None   Collection Time: 06/24/16  4:50 PM  Result Value Ref Range   Creatinine, Urine 29.50 mg/dL  Renal function panel     Status: Abnormal   Collection Time: 06/25/16  6:36 AM  Result Value Ref Range   Sodium 136 135 - 145 mmol/L   Potassium 4.2 3.5 - 5.1 mmol/L   Chloride 114 (H) 101 - 111 mmol/L   CO2 15 (L) 22 - 32 mmol/L   Glucose, Bld 104 (H) 65 - 99 mg/dL   BUN 22 (H) 6 - 20 mg/dL   Creatinine, Ser 2.36 (H) 0.44 - 1.00 mg/dL   Calcium 7.2 (L) 8.9 - 10.3 mg/dL   Phosphorus 4.1 2.5 - 4.6 mg/dL   Albumin 2.1 (L) 3.5 - 5.0 g/dL   GFR calc non Af Amer 23 (L) >60 mL/min   GFR calc Af Amer 27 (L) >60 mL/min    Comment: (NOTE) The eGFR has been calculated using the CKD EPI equation. This calculation has not been validated in all clinical situations. eGFR's persistently <60 mL/min signify  possible Chronic Kidney Disease.    Anion gap 7 5 - 15  Renal function panel     Status: Abnormal   Collection Time: 06/26/16  5:23 AM  Result Value Ref Range   Sodium 138 135 - 145 mmol/L   Potassium 3.4 (L) 3.5 - 5.1 mmol/L    Comment: DELTA CHECK NOTED   Chloride 115 (H) 101 - 111 mmol/L   CO2 15 (L) 22 - 32 mmol/L   Glucose, Bld 98 65 - 99 mg/dL   BUN 21 (H) 6 - 20 mg/dL   Creatinine, Ser 2.31 (H) 0.44 - 1.00 mg/dL   Calcium 7.3 (L) 8.9 - 10.3 mg/dL   Phosphorus 4.1 2.5 - 4.6 mg/dL   Albumin 1.9 (L) 3.5 - 5.0 g/dL   GFR calc non Af Amer 24 (L) >60 mL/min   GFR calc Af Amer 28 (L) >60 mL/min    Comment: (NOTE) The eGFR has been calculated using the CKD EPI equation. This calculation has not been validated in all clinical situations. eGFR's persistently <60 mL/min signify possible Chronic Kidney Disease.    Anion gap 8 5 - 15  CBC     Status: Abnormal   Collection Time: 06/26/16  5:23 AM  Result Value Ref Range   WBC 12.7 (H) 4.0 - 10.5  K/uL   RBC 2.64 (L) 3.87 - 5.11 MIL/uL   Hemoglobin 7.7 (L) 12.0 - 15.0 g/dL   HCT 23.7 (L) 36.0 - 46.0 %   MCV 89.8 78.0 - 100.0 fL   MCH 29.2 26.0 - 34.0 pg   MCHC 32.5 30.0 - 36.0 g/dL   RDW 17.0 (H) 11.5 - 15.5 %   Platelets 186 150 - 400 K/uL    ABGS No results for input(s): PHART, PO2ART, TCO2, HCO3 in the last 72 hours.  Invalid input(s): PCO2 CULTURES Recent Results (from the past 240 hour(s))  Urine culture     Status: Abnormal   Collection Time: 06/22/16 12:37 PM  Result Value Ref Range Status   Specimen Description URINE, CATHETERIZED  Final   Special Requests NONE  Final   Culture MULTIPLE SPECIES PRESENT, SUGGEST RECOLLECTION (A)  Final   Report Status 06/24/2016 FINAL  Final  MRSA PCR Screening     Status: Abnormal   Collection Time: 06/22/16  7:00 PM  Result Value Ref Range Status   MRSA by PCR POSITIVE (A) NEGATIVE Final    Comment:        The GeneXpert MRSA Assay (FDA approved for NASAL specimens only),  is one component of a comprehensive MRSA colonization surveillance program. It is not intended to diagnose MRSA infection nor to guide or monitor treatment for MRSA infections. RESULT CALLED TO, READ BACK BY AND VERIFIED WITH:  VILLALOBOS,S @ 0005 ON 06/23/16 BY JUW    Studies/Results: US Renal  Result Date: 06/24/2016 CLINICAL DATA:  Atrophy. EXAM: RENAL / URINARY TRACT ULTRASOUND COMPLETE COMPARISON:  None. FINDINGS: Right Kidney: Length: 7.7 cm. Irregular contour . Echogenicity within normal limits. No mass or hydronephrosis visualized. Left Kidney: Length: 10.6 cm. Echogenicity within normal limits. No mass or hydronephrosis visualized. Bladder: No bladder distention.  Suprapubic catheter present. IMPRESSION: 1.  Atrophy and scarring right kidney. 2.  Bladder is nondistended.  Suprapubic pubic catheters present. Electronically Signed   By: Marcello Moores  Register   On: 06/24/2016 10:36    Medications: I have reviewed the patient's current medications.  Assesment:  Principal Problem:   Encephalopathy acute Active Problems:   Paraplegia (HCC)   Suprapubic catheter (HCC)   Decubitus ulcer of buttock   Acute renal failure (HCC)   Seizure disorder (HCC)   High blood pressure   UTI (urinary tract infection)   Pressure ulcer   Altered mental status    Plan:  Medications reviewed Continue iv hydration Continue IV antibiotics Continue current treatment as per nephrology recommendation    LOS: 4 days   Amariah Kierstead 06/26/2016, 8:05 AM

## 2016-06-27 LAB — PREPARE RBC (CROSSMATCH)

## 2016-06-27 MED ORDER — HYDROCORTISONE 1 % EX CREA
TOPICAL_CREAM | Freq: Four times a day (QID) | CUTANEOUS | Status: DC | PRN
  Administered 2016-06-28: 1 via TOPICAL
  Filled 2016-06-27: qty 1.5

## 2016-06-27 MED ORDER — LORATADINE 10 MG PO TABS
10.0000 mg | ORAL_TABLET | Freq: Every day | ORAL | Status: DC
  Administered 2016-06-27 – 2016-06-28 (×2): 10 mg via ORAL
  Filled 2016-06-27 (×2): qty 1

## 2016-06-27 MED ORDER — METHYLPREDNISOLONE SODIUM SUCC 40 MG IJ SOLR
40.0000 mg | Freq: Once | INTRAMUSCULAR | Status: AC
  Administered 2016-06-27: 40 mg via INTRAVENOUS
  Filled 2016-06-27: qty 1

## 2016-06-27 MED ORDER — POLYSACCHARIDE IRON COMPLEX 150 MG PO CAPS
150.0000 mg | ORAL_CAPSULE | Freq: Two times a day (BID) | ORAL | Status: DC
  Administered 2016-06-27 – 2016-06-28 (×3): 150 mg via ORAL
  Filled 2016-06-27 (×3): qty 1

## 2016-06-27 MED ORDER — SODIUM CHLORIDE 0.9 % IV SOLN: Freq: Once | INTRAVENOUS | Status: DC

## 2016-06-27 MED ORDER — POTASSIUM CHLORIDE CRYS ER 20 MEQ PO TBCR
40.0000 meq | EXTENDED_RELEASE_TABLET | Freq: Every day | ORAL | Status: DC
  Administered 2016-06-27 – 2016-06-28 (×2): 40 meq via ORAL
  Filled 2016-06-27 (×2): qty 2

## 2016-06-27 MED ORDER — CIPROFLOXACIN IN D5W 400 MG/200ML IV SOLN: 400.0000 mg | Freq: Two times a day (BID) | INTRAVENOUS | Status: DC

## 2016-06-27 NOTE — Progress Notes (Signed)
Subjective: Patient is awake.Chronically sick looking. Her Hemoglobin dropped below 8. She has iron studies. Patient is being followed by nephrology.  Objective: Vital signs in last 24 hours: Temp:  [97.3 F (36.3 C)-98 F (36.7 C)] 97.3 F (36.3 C) (02/01 0514) Pulse Rate:  [59-75] 75 (02/01 0514) Resp:  [16] 16 (02/01 0514) BP: (109-156)/(59-87) 143/75 (02/01 0514) SpO2:  [100 %] 100 % (02/01 0514) Weight change:  Last BM Date: 06/26/16  Intake/Output from previous day: 01/31 0701 - 02/01 0700 In: 240 [P.O.:240] Out: 1325 [Urine:1325]  PHYSICAL EXAM General appearance: alert and no distress Resp: clear to auscultation bilaterally Cardio: S1, S2 normal GI: soft, non-tender; bowel sounds normal; no masses,  no organomegaly Extremities: paraplegic  Lab Results:  Results for orders placed or performed during the hospital encounter of 06/22/16 (from the past 48 hour(s))  Renal function panel     Status: Abnormal   Collection Time: 06/26/16  5:23 AM  Result Value Ref Range   Sodium 138 135 - 145 mmol/L   Potassium 3.4 (L) 3.5 - 5.1 mmol/L    Comment: DELTA CHECK NOTED   Chloride 115 (H) 101 - 111 mmol/L   CO2 15 (L) 22 - 32 mmol/L   Glucose, Bld 98 65 - 99 mg/dL   BUN 21 (H) 6 - 20 mg/dL   Creatinine, Ser 2.31 (H) 0.44 - 1.00 mg/dL   Calcium 7.3 (L) 8.9 - 10.3 mg/dL   Phosphorus 4.1 2.5 - 4.6 mg/dL   Albumin 1.9 (L) 3.5 - 5.0 g/dL   GFR calc non Af Amer 24 (L) >60 mL/min   GFR calc Af Amer 28 (L) >60 mL/min    Comment: (NOTE) The eGFR has been calculated using the CKD EPI equation. This calculation has not been validated in all clinical situations. eGFR's persistently <60 mL/min signify possible Chronic Kidney Disease.    Anion gap 8 5 - 15  CBC     Status: Abnormal   Collection Time: 06/26/16  5:23 AM  Result Value Ref Range   WBC 12.7 (H) 4.0 - 10.5 K/uL   RBC 2.64 (L) 3.87 - 5.11 MIL/uL   Hemoglobin 7.7 (L) 12.0 - 15.0 g/dL   HCT 23.7 (L) 36.0 - 46.0 %   MCV 89.8 78.0 - 100.0 fL   MCH 29.2 26.0 - 34.0 pg   MCHC 32.5 30.0 - 36.0 g/dL   RDW 17.0 (H) 11.5 - 15.5 %   Platelets 186 150 - 400 K/uL  Iron and TIBC     Status: Abnormal   Collection Time: 06/26/16  5:23 AM  Result Value Ref Range   Iron 30 28 - 170 ug/dL   TIBC 199 (L) 250 - 450 ug/dL   Saturation Ratios 15 10.4 - 31.8 %   UIBC 169 ug/dL    Comment: Performed at [facility], [address], [place], [place] [address]  Ferritin     Status: None   Collection Time: 06/26/16  5:23 AM  Result Value Ref Range   Ferritin 28 11 - 307 ng/mL    Comment: Performed at [facility], [place] [address], [place], [place] [address]    ABGS No results for input(s): PHART, PO2ART, TCO2, HCO3 in the last 72 hours.  Invalid input(s): PCO2 CULTURES Recent Results (from the past 240 hour(s))  Urine culture     Status: Abnormal   Collection Time: 06/22/16 12:37 PM  Result Value Ref Range Status   Specimen Description URINE, CATHETERIZED  Final   Special Requests NONE  Final   Culture MULTIPLE SPECIES PRESENT, SUGGEST RECOLLECTION (A)  Final   Report Status 06/24/2016 FINAL  Final  MRSA PCR Screening     Status: Abnormal   Collection Time: 06/22/16  7:00 PM  Result Value Ref Range Status   MRSA by PCR POSITIVE (A) NEGATIVE Final    Comment:        The GeneXpert MRSA Assay (FDA approved for NASAL specimens only), is one component of a comprehensive MRSA colonization surveillance program. It is not intended to diagnose MRSA infection nor to guide or monitor treatment for MRSA infections. RESULT CALLED TO, READ BACK BY AND VERIFIED WITH:  VILLALOBOS,S @ 0005 ON 06/23/16 BY JUW    Studies/Results: No results found.  Medications: I have reviewed the patient's current medications.  Assesment:  Principal Problem:   Encephalopathy acute Active Problems:   Paraplegia (HCC)   Suprapubic catheter (HCC)   Decubitus ulcer of buttock   Acute renal failure (HCC)   Seizure  disorder (HCC)   High blood pressure   UTI (urinary tract infection)   Pressure ulcer   Altered mental status Anemia of chronic disease   Plan:  Medications reviewed Continue iv hydration Continue IV antibiotics Transfuse 2 units of PRBC Will do CBC, renal function in AM    LOS: 5 days   Dee Paden 06/27/2016, 12:27 PM

## 2016-06-27 NOTE — Progress Notes (Addendum)
Subjective: Interval History: Patient complains of some back pain otherwise feels okay. Her pain is chronic. Patient denies any nausea or vomiting. She denies also any difficulty breathing.  Objective: Vital signs in last 24 hours: Temp:  [97.3 F (36.3 C)-98 F (36.7 C)] 97.3 F (36.3 C) (02/01 0514) Pulse Rate:  [59-75] 75 (02/01 0514) Resp:  [16] 16 (02/01 0514) BP: (109-156)/(59-87) 143/75 (02/01 0514) SpO2:  [100 %] 100 % (02/01 0514) Weight change:   Intake/Output from previous day: 01/31 0701 - 02/01 0700 In: 240 [P.O.:240] Out: 1325 [Urine:1325] Intake/Output this shift: No intake/output data recorded.  General appearance: alert, cooperative and no distress Resp: clear to auscultation bilaterally Cardio: regular rate and rhythm, S1, S2 normal, no murmur, click, rub or gallop GI: soft, non-tender; bowel sounds normal; no masses,  no organomegaly Extremities: extremities normal, atraumatic, no cyanosis or edema  Lab Results:  Recent Labs  06/26/16 0523  WBC 12.7*  HGB 7.7*  HCT 23.7*  PLT 186   BMET:   Recent Labs  06/25/16 0636 06/26/16 0523  NA 136 138  K 4.2 3.4*  CL 114* 115*  CO2 15* 15*  GLUCOSE 104* 98  BUN 22* 21*  CREATININE 2.36* 2.31*  CALCIUM 7.2* 7.3*   No results for input(s): PTH in the last 72 hours. Iron Studies:   Recent Labs  06/26/16 0523  IRON 30  TIBC 199*  FERRITIN 28    Studies/Results: No results found.  I have reviewed the patient's current medications.  Assessment/Plan: Problem #1 acute kidney injury: Possibly prerenal versus ATN.Marland Kitchen Patient with right small kidney. 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 She has also recurrent urinary tract infection. Her renal function is showing some improvement. Patient presently non-oliguric. Her potassium is slightly low Problem #2 metabolic acidosis: Her CO2 is 15 still remains low. Patient is started on  sodium bicarbonate and presently he didn't show any improvement. Problem #3 anemia: Her iron saturation and ferritin is low. Hence patient has iron deficiency anemia. Problem #4 UTI: Presently she is on antibiotics. Problem #5 hypertension her blood pressure is reasonably controlled Problem #6 history of seizure disorder Problem #7 paraplegia Plan: 1]We'll start patient  On potassium 40 meq po once a day 2] will continue with hydration 3] will check CBC and renal panel in the morning.    LOS: 5 days   Sherissa Tenenbaum S 06/27/2016,9:31 AM

## 2016-06-27 NOTE — Progress Notes (Signed)
Pharmacy Antibiotic Note  [name] is a 51 y.o. female admitted on 06/22/2016 with wound infection.  Pharmacy has been consulted for ZOSYN dosing. Patient is improving slowly and afebrile.  Plan:  Continue Zosyn 3.375gm IV q8h, EID Monitor labs, renal fxn, progress and c/s [place] ABX when improved / appropriate.    Height: 5\' 4"  (162.6 cm) Weight: 134 lb 4.8 oz (60.9 kg) IBW/kg (Calculated) : 54.7  Temp (24hrs), Avg:97.5 F (36.4 C), Min:97.3 F (36.3 C), Max:98 F (36.7 C)   Recent Labs Lab 06/22/16 1346 06/22/16 1555 06/23/16 0600 06/25/16 0636 06/26/16 0523  WBC 7.6  --  9.4  --  12.7*  CREATININE 2.41*  --  2.34* 2.36* 2.31*  LATICACIDVEN  --  0.7 0.6  --   --     Estimated Creatinine Clearance: 25.7 mL/min (by C-G formula based on SCr of 2.31 mg/dL (H)).    Allergies  Allergen Reactions  . Benadryl [Diphenhydramine Hcl (Sleep)] Hives  . Quinine Derivatives Other (See Comments)    Alters mental status  . Azithromycin Itching and Rash   Antimicrobials this admission: Vancomycin 1/27 >> 1/30 Zosyn 1/27 >>   Dose adjustments this admission:  Microbiology results:  1/27 Urine cx multiple species 1/27 MRSA PCR is +  Thank you for allowing pharmacy to be a part of this patient's care. [name], BS [name], [name] Clinical Pharmacist Pager [phone] 06/27/2016 12:52 PM

## 2016-06-27 NOTE — Progress Notes (Signed)
Blood started at 1523.  At 1538 VS were taken and upon repositioning the pt, noticed a rash to pt's back.  Dr. Sharren Bridge called and orders given for Solu-Medrol and Claritin and to continue blood transfusion.  At [place] blood completed and VS stable, reassessed pt's back and rash was unchanged.  Dr. Sharren Bridge was notified and orders given to D/C Zosyn, D/C blood, draw CBC in am, and start Cipro in am.

## 2016-06-28 LAB — CBC
HEMATOCRIT: 27.6 % — AB (ref 36.0–46.0)
HEMATOCRIT: 27.2 % — AB (ref 36.0–46.0)
HEMOGLOBIN: 9.1 g/dL — AB (ref 12.0–15.0)
Hemoglobin: 9.2 g/dL — ABNORMAL LOW (ref 12.0–15.0)
MCH: 29.9 pg (ref 26.0–34.0)
MCH: 29.8 pg (ref 26.0–34.0)
MCHC: 33.3 g/dL (ref 30.0–36.0)
MCHC: 33.5 g/dL (ref 30.0–36.0)
MCV: 89.6 fL (ref 78.0–100.0)
MCV: 89.2 fL (ref 78.0–100.0)
PLATELETS: 159 10*3/uL (ref 150–400)
Platelets: 168 10*3/uL (ref 150–400)
RBC: 3.05 MIL/uL — ABNORMAL LOW (ref 3.87–5.11)
RBC: 3.08 MIL/uL — AB (ref 3.87–5.11)
RDW: 16.5 % — ABNORMAL HIGH (ref 11.5–15.5)
RDW: 16.8 % — ABNORMAL HIGH (ref 11.5–15.5)
WBC: 12.2 10*3/uL — ABNORMAL HIGH (ref 4.0–10.5)
WBC: 13.5 10*3/uL — ABNORMAL HIGH (ref 4.0–10.5)

## 2016-06-28 LAB — RENAL FUNCTION PANEL
Albumin: 1.7 g/dL — ABNORMAL LOW (ref 3.5–5.0)
Anion gap: 5 (ref 5–15)
BUN: 16 mg/dL (ref 6–20)
CHLORIDE: 119 mmol/L — AB (ref 101–111)
CO2: 15 mmol/L — AB (ref 22–32)
CREATININE: 2.08 mg/dL — AB (ref 0.44–1.00)
Calcium: 7.4 mg/dL — ABNORMAL LOW (ref 8.9–10.3)
GFR calc Af Amer: 31 mL/min — ABNORMAL LOW (ref >60)
GFR calc non Af Amer: 27 mL/min — ABNORMAL LOW (ref >60)
Glucose, Bld: 120 mg/dL — ABNORMAL HIGH (ref 65–99)
POTASSIUM: 3.4 mmol/L — AB (ref 3.5–5.1)
Phosphorus: 5.1 mg/dL — ABNORMAL HIGH (ref 2.5–4.6)
Sodium: 139 mmol/L (ref 135–145)

## 2016-06-28 MED ORDER — CIPROFLOXACIN IN D5W 400 MG/200ML IV SOLN
400.0000 mg | INTRAVENOUS | Status: DC
  Administered 2016-06-28: 400 mg via INTRAVENOUS
  Filled 2016-06-28: qty 200

## 2016-06-28 MED ORDER — CIPROFLOXACIN HCL 500 MG PO TABS: 500.0000 mg | ORAL_TABLET | Freq: Two times a day (BID) | ORAL | 0 refills | Status: DC

## 2016-06-28 MED ORDER — POLYSACCHARIDE IRON COMPLEX 150 MG PO CAPS: 150.0000 mg | ORAL_CAPSULE | Freq: Two times a day (BID) | ORAL | 3 refills | Status: DC

## 2016-06-28 NOTE — Care Management Note (Signed)
Case Management Note  Patient Details  Name: [name] MRN: [id number] Date of Birth: 12/08/1965  Expected Discharge Date:  06/28/16               Expected Discharge Plan:  [place]  In-House Referral:  NA  Discharge planning Services  CM Consult  Post Acute Care Choice:  Home Health Choice offered to:  Patient, Adult Children  HH Arranged:  RN [facility] Agency:  [place]  Status of Service:  Completed, signed off  Additional Comments: Pt discharging home today with resumption of her [facility] services through [facility]. Pt is aware HH has 48hrs to resume services. CM has attempted to contact pt's son multiple times to discuss transportation home. CM unable to make contact or leave CM. RN will continue to make contact.   Sherald Barge, RN 06/28/2016, 12:04 PM

## 2016-06-28 NOTE — Discharge Summary (Signed)
Physician Discharge Summary  Patient ID: [name] MRN: [id number] DOB/AGE: Jan 18, 1966 51 y.o. Primary Care Physician:BEFEKADU,BELAYENH S, MD Admit date: 06/22/2016 Discharge date: 06/28/2016    Discharge Diagnoses:   Principal Problem:   Encephalopathy acute Active Problems:   Paraplegia ([place])   Suprapubic catheter ([place])   Decubitus ulcer of buttock   Acute renal failure (HCC)   Seizure disorder (HCC)   High blood pressure   UTI (urinary tract infection)   Pressure ulcer   Altered mental status   Acute renal failure Anemia of chronic disease  Allergies as of 06/28/2016      Reactions   Benadryl [diphenhydramine Hcl (sleep)] Hives   Quinine Derivatives Other (See Comments)   Alters mental status   Azithromycin Itching, Rash   Zosyn [piperacillin Sod-tazobactam So] Rash      Medication List    STOP taking these medications   furosemide 20 MG tablet Commonly known as:  LASIX     TAKE these medications   acetaminophen 325 MG tablet Commonly known as:  TYLENOL Take 650 mg by mouth every 6 (six) hours as needed.   amLODipine 10 MG tablet Commonly known as:  NORVASC Take 10 mg by mouth daily.   baclofen 10 MG tablet Commonly known as:  LIORESAL Take 1 tablet by mouth 3 (three) times daily.   ciprofloxacin 500 MG tablet Commonly known as:  CIPRO Take 1 tablet (500 mg total) by mouth 2 (two) times daily.   cloNIDine 0.2 MG tablet Commonly known as:  CATAPRES Take 0.2 mg by mouth 3 (three) times daily.   dantrolene 25 MG capsule Commonly known as:  DANTRIUM Take 50 mg by mouth 2 (two) times daily.   desmopressin 0.2 MG tablet Commonly known as:  DDAVP Take 1 tablet by mouth at bedtime.   docusate sodium 100 MG capsule Commonly known as:  COLACE Take 100 mg by mouth at bedtime.   fesoterodine 8 MG Tb24 tablet Commonly known as:  TOVIAZ Take 8 mg by mouth daily.   gabapentin 300 MG capsule Commonly known as:  NEURONTIN Take 300 mg by mouth 3  (three) times daily.   HYDROcodone-acetaminophen 5-325 MG tablet Commonly known as:  NORCO/VICODIN Take 1 tablet by mouth every 6 (six) hours as needed.   iron polysaccharides 150 MG capsule Commonly known as:  NIFEREX Take 1 capsule (150 mg total) by mouth 2 (two) times daily.   levETIRAcetam 1000 MG tablet Commonly known as:  KEPPRA Take 1,000 mg by mouth 3 (three) times daily.   metoprolol tartrate 25 MG tablet Commonly known as:  LOPRESSOR Take 25 mg by mouth 2 (two) times daily.   MULTIVITAMIN ADULT PO Take 1 tablet by mouth daily.   nitrofurantoin (macrocrystal-monohydrate) 100 MG capsule Commonly known as:  MACROBID Take 100 mg by mouth 2 (two) times daily.   oxybutynin 5 MG tablet Commonly known as:  DITROPAN Take 5 mg by mouth 3 (three) times daily as needed.   pantoprazole 40 MG tablet Commonly known as:  PROTONIX Take 40 mg by mouth daily.   polyethylene glycol powder powder Commonly known as:  GLYCOLAX/MIRALAX Take 17 g by mouth daily as needed.   potassium chloride SA 20 MEQ tablet Commonly known as:  K-DUR,KLOR-CON Take 1 tablet by mouth daily.   silver nitrate applicators 03-47 % applicator Apply topically daily. What changed:  how much to take   tiZANidine 4 MG tablet Commonly known as:  ZANAFLEX Take 4 mg by mouth 2 (two) times  daily.   vitamin C 500 MG tablet Commonly known as:  ASCORBIC ACID Take 500 mg by mouth 2 (two) times daily.       Discharged Condition: improved    Consults: Nephrology  Significant Diagnostic Studies: Ct Head Wo Contrast  Result Date: 06/22/2016 CLINICAL DATA:  Altered mental status EXAM: CT HEAD WITHOUT CONTRAST TECHNIQUE: Contiguous axial images were obtained from the base of the skull through the vertex without intravenous contrast. COMPARISON:  08/16/2015 FINDINGS: Brain: High posterior left frontal gliosis is stable. Stable lateral ventriculomegaly with corpus callosum thinning. No evidence of acute  infarct, hemorrhage, obstructive hydrocephalus, or mass. Vascular: No hyperdense vessel or unexpected calcification. Skull: Normal. Negative for fracture or focal lesion. Sinuses/Orbits: Negative IMPRESSION: 1. No acute finding or change from prior. 2. Left posterior frontal gliosis. Electronically Signed   By: Monte Fantasia M.D.   On: 06/22/2016 15:28   US Renal  Result Date: 06/24/2016 CLINICAL DATA:  Atrophy. EXAM: RENAL / URINARY TRACT ULTRASOUND COMPLETE COMPARISON:  None. FINDINGS: Right Kidney: Length: 7.7 cm. Irregular contour . Echogenicity within normal limits. No mass or hydronephrosis visualized. Left Kidney: Length: 10.6 cm. Echogenicity within normal limits. No mass or hydronephrosis visualized. Bladder: No bladder distention.  Suprapubic catheter present. IMPRESSION: 1.  Atrophy and scarring right kidney. 2.  Bladder is nondistended.  Suprapubic pubic catheters present. Electronically Signed   By: Marcello Moores  Register   On: 06/24/2016 10:36   Dg Chest Portable 1 View  Result Date: 06/22/2016 CLINICAL DATA:  Altered mental status EXAM: PORTABLE CHEST 1 VIEW COMPARISON:  05/10/2016 FINDINGS: Cardiomegaly with vascular congestion. No confluent opacity or effusion. No acute bony abnormality. IMPRESSION: Cardiomegaly, vascular congestion. Electronically Signed   By: Rolm Baptise M.D.   On: 06/22/2016 14:15    Lab Results: Basic Metabolic Panel:  Recent Labs  06/26/16 0523 06/28/16 0610  NA 138 139  K 3.4* 3.4*  CL 115* 119*  CO2 15* 15*  GLUCOSE 98 120*  BUN 21* 16  CREATININE 2.31* 2.08*  CALCIUM 7.3* 7.4*  PHOS 4.1 5.1*   Liver Function Tests:  Recent Labs  06/26/16 0523 06/28/16 0610  ALBUMIN 1.9* 1.7*     CBC:  Recent Labs  06/27/16 2357 06/28/16 0610  WBC 13.5* 12.2*  HGB 9.2* 9.1*  HCT 27.6* 27.2*  MCV 89.6 89.2  PLT 159 168    Recent Results (from the past 240 hour(s))  Urine culture     Status: Abnormal   Collection Time: 06/22/16 12:37 PM  Result  Value Ref Range Status   Specimen Description URINE, CATHETERIZED  Final   Special Requests NONE  Final   Culture MULTIPLE SPECIES PRESENT, SUGGEST RECOLLECTION (A)  Final   Report Status 06/24/2016 FINAL  Final  MRSA PCR Screening     Status: Abnormal   Collection Time: 06/22/16  7:00 PM  Result Value Ref Range Status   MRSA by PCR POSITIVE (A) NEGATIVE Final    Comment:        The GeneXpert MRSA Assay (FDA approved for NASAL specimens only), is one component of a comprehensive MRSA colonization surveillance program. It is not intended to diagnose MRSA infection nor to guide or monitor treatment for MRSA infections. RESULT CALLED TO, READ BACK BY AND VERIFIED WITH:  VILLALOBOS,S @ 0005 ON 06/23/16 BY [facility] Course:   This is a 50 years old female with history of multiple medical illnesses who was admitted due to change in mental  status. She was also found abnormal urinalysis. Her renal function was elevated and she was severly anemic. Patient received combinations of IV antibiotics.  Nephrology consult was done who assisted in her treatment. Patient was transfused 2 units PRBC. Her urine culture groow multiple organisms. Her blood culture was negative. Patient has improved and she back to her baseline. Patient will discharged with oral antibiotics.  Discharge Exam: Blood pressure (!) 155/74, pulse 87, temperature 100.3 F (37.9 C), temperature source Oral, resp. rate 18, height 5\' 4"  (1.626 m), weight 60.9 kg (134 lb 4.8 oz), SpO2 100 %.    Disposition:  Home      Signed: Lucilia [name]   06/28/2016, 8:28 AM

## 2016-06-28 NOTE — Progress Notes (Signed)
Subjective: Interval History: Patient denies any difficulty breathing. Overall she doesn't have any complaints.  Objective: Vital signs in last 24 hours: Temp:  [97.5 F (36.4 C)-100.3 F (37.9 C)] 100.3 F (37.9 C) (02/02 0634) Pulse Rate:  [67-87] 87 (02/02 0634) Resp:  [18-20] 18 (02/02 0634) BP: (99-155)/(49-81) 155/74 (02/02 0634) SpO2:  [99 %-100 %] 100 % (02/02 0634) Weight change:   Intake/Output from previous day: 02/01 0701 - 02/02 0700 In: 5005 [I.V.:3605; Blood:900; IV Piggyback:500] Out: -  Intake/Output this shift: No intake/output data recorded.  General appearance: alert, cooperative and no distress Resp: clear to auscultation bilaterally Cardio: regular rate and rhythm, S1, S2 normal, no murmur, click, rub or gallop GI: soft, non-tender; bowel sounds normal; no masses,  no organomegaly Extremities: extremities normal, atraumatic, no cyanosis or edema  Lab Results:  Recent Labs  06/27/16 2357 06/28/16 0610  WBC 13.5* 12.2*  HGB 9.2* 9.1*  HCT 27.6* 27.2*  PLT 159 168   BMET:   Recent Labs  06/26/16 0523 06/28/16 0610  NA 138 139  K 3.4* 3.4*  CL 115* 119*  CO2 15* 15*  GLUCOSE 98 120*  BUN 21* 16  CREATININE 2.31* 2.08*  CALCIUM 7.3* 7.4*   No results for input(s): PTH in the last 72 hours. Iron Studies:   Recent Labs  06/26/16 0523  IRON 30  TIBC 199*  FERRITIN 28    Studies/Results: No results found.  I have reviewed the patient's current medications.  Assessment/Plan: Problem #1 acute kidney injury: Possibly prerenal versus ATN.Marland Kitchen Patient with right small kidney. 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 Her renal function continued to improve. Her creatinine is 2.08. Problem #2 metabolic acidosis: Her CO2 is 15 still remains low. Patient presently on sodium bicarbonate. Problem #3 anemia: Her iron saturation and ferritin is low. Hence patient has iron  deficiency anemia. Patient is on by mouth iron supplement. Problem #4 UTI: Presently she is on antibiotics. She is a febrile and white blood cell count is improving. Problem #5 hypertension her blood pressure is reasonably controlled Problem #6 history of seizure disorder Problem #7 paraplegia Plan: 1] patient advised to increase her fluid intake. 2] will DC IV fluid    LOS: 6 days   Ailanie Ruttan S 06/28/2016,8:36 AM

## 2016-07-01 ENCOUNTER — Emergency Department (HOSPITAL_COMMUNITY): Payer: Medicaid Other

## 2016-07-01 ENCOUNTER — Emergency Department (HOSPITAL_COMMUNITY)
Admission: EM | Admit: 2016-07-01 | Discharge: 2016-07-02 | Disposition: A | Discharge status: Home / Self Care | Payer: Medicaid Other | Attending: Emergency Medicine | Admitting: Emergency Medicine

## 2016-07-01 ENCOUNTER — Encounter (HOSPITAL_COMMUNITY): Payer: Self-pay

## 2016-07-01 DIAGNOSIS — R509 Fever, unspecified: Secondary | ICD-10-CM | POA: Insufficient documentation

## 2016-07-01 DIAGNOSIS — Z79899 Other long term (current) drug therapy: Secondary | ICD-10-CM | POA: Insufficient documentation

## 2016-07-01 DIAGNOSIS — Z791 Long term (current) use of non-steroidal anti-inflammatories (NSAID): Secondary | ICD-10-CM | POA: Diagnosis not present

## 2016-07-01 DIAGNOSIS — R05 Cough: Secondary | ICD-10-CM | POA: Diagnosis present

## 2016-07-01 DIAGNOSIS — Z8541 Personal history of malignant neoplasm of cervix uteri: Secondary | ICD-10-CM | POA: Diagnosis not present

## 2016-07-01 DIAGNOSIS — J111 Influenza due to unidentified influenza virus with other respiratory manifestations: Secondary | ICD-10-CM | POA: Insufficient documentation

## 2016-07-01 HISTORY — DX: Urinary tract infection, site not specified: N39.0

## 2016-07-01 LAB — CBC WITH DIFFERENTIAL/PLATELET
BASOS ABS: 0 10*3/uL (ref 0.0–0.1)
BASOS PCT: 0 %
EOS PCT: 0 %
Eosinophils Absolute: 0 10*3/uL (ref 0.0–0.7)
HEMATOCRIT: 23.7 % — AB (ref 36.0–46.0)
HEMOGLOBIN: 7.9 g/dL — AB (ref 12.0–15.0)
LYMPHS ABS: 1.7 10*3/uL (ref 0.7–4.0)
Lymphocytes Relative: 15 %
MCH: 29.4 pg (ref 26.0–34.0)
MCHC: 33.3 g/dL (ref 30.0–36.0)
MCV: 88.1 fL (ref 78.0–100.0)
MONOS PCT: 7 %
Monocytes Absolute: 0.8 10*3/uL (ref 0.1–1.0)
Neutro Abs: 9 10*3/uL — ABNORMAL HIGH (ref 1.7–7.7)
Neutrophils Relative %: 78 %
Platelets: 148 10*3/uL — ABNORMAL LOW (ref 150–400)
RBC: 2.69 MIL/uL — ABNORMAL LOW (ref 3.87–5.11)
RDW: 16.8 % — ABNORMAL HIGH (ref 11.5–15.5)
WBC: 11.5 10*3/uL — ABNORMAL HIGH (ref 4.0–10.5)

## 2016-07-01 LAB — URINALYSIS, ROUTINE W REFLEX MICROSCOPIC
Bilirubin Urine: NEGATIVE
Glucose, UA: 150 mg/dL — AB
Ketones, ur: NEGATIVE mg/dL
Nitrite: NEGATIVE
Protein, ur: >=300 mg/dL — AB
Specific Gravity, Urine: 1.009 (ref 1.005–1.030)
pH: 6 (ref 5.0–8.0)

## 2016-07-01 LAB — COMPREHENSIVE METABOLIC PANEL
ALBUMIN: 2.1 g/dL — AB (ref 3.5–5.0)
ALK PHOS: 66 U/L (ref 38–126)
ALT: 18 U/L (ref 14–54)
ANION GAP: 6 (ref 5–15)
AST: 40 U/L (ref 15–41)
BILIRUBIN TOTAL: 0.3 mg/dL (ref 0.3–1.2)
BUN: 13 mg/dL (ref 6–20)
CALCIUM: 7.7 mg/dL — AB (ref 8.9–10.3)
CO2: 18 mmol/L — ABNORMAL LOW (ref 22–32)
Chloride: 114 mmol/L — ABNORMAL HIGH (ref 101–111)
Creatinine, Ser: 2.16 mg/dL — ABNORMAL HIGH (ref 0.44–1.00)
GFR calc Af Amer: 30 mL/min — ABNORMAL LOW (ref >60)
GFR, EST NON AFRICAN AMERICAN: 26 mL/min — AB (ref >60)
Glucose, Bld: 101 mg/dL — ABNORMAL HIGH (ref 65–99)
Potassium: 3 mmol/L — ABNORMAL LOW (ref 3.5–5.1)
Sodium: 138 mmol/L (ref 135–145)
TOTAL PROTEIN: 5.1 g/dL — AB (ref 6.5–8.1)

## 2016-07-01 LAB — TYPE AND SCREEN
ABO/RH(D): A POS
Antibody Screen: NEGATIVE
UNIT DIVISION: 0
Unit division: 0

## 2016-07-01 LAB — INFLUENZA PANEL BY PCR (TYPE A & B)
INFLAPCR: POSITIVE — AB
Influenza B By PCR: NEGATIVE

## 2016-07-01 LAB — LACTIC ACID, PLASMA: Lactic Acid, Venous: 0.6 mmol/L (ref 0.5–1.9)

## 2016-07-01 MED ORDER — ALBUTEROL SULFATE HFA 108 (90 BASE) MCG/ACT IN AERS
2.0000 | INHALATION_SPRAY | RESPIRATORY_TRACT | Status: DC | PRN
  Administered 2016-07-01: 2 via RESPIRATORY_TRACT
  Filled 2016-07-01: qty 6.7

## 2016-07-01 MED ORDER — OSELTAMIVIR PHOSPHATE 75 MG PO CAPS
75.0000 mg | ORAL_CAPSULE | Freq: Once | ORAL | Status: AC
  Administered 2016-07-01: 75 mg via ORAL

## 2016-07-01 MED ORDER — IPRATROPIUM-ALBUTEROL 0.5-2.5 (3) MG/3ML IN SOLN
3.0000 mL | Freq: Once | RESPIRATORY_TRACT | Status: AC
  Administered 2016-07-01: 3 mL via RESPIRATORY_TRACT
  Filled 2016-07-01: qty 3

## 2016-07-01 MED ORDER — SODIUM CHLORIDE 0.9 % IV BOLUS (SEPSIS)
500.0000 mL | Freq: Once | INTRAVENOUS | Status: AC
  Administered 2016-07-01: 500 mL via INTRAVENOUS

## 2016-07-01 MED ORDER — POTASSIUM CHLORIDE CRYS ER 20 MEQ PO TBCR
40.0000 meq | EXTENDED_RELEASE_TABLET | Freq: Once | ORAL | Status: AC
  Administered 2016-07-01: 40 meq via ORAL
  Filled 2016-07-01: qty 2

## 2016-07-01 MED ORDER — OSELTAMIVIR PHOSPHATE 75 MG PO CAPS: 75.0000 mg | ORAL_CAPSULE | Freq: Two times a day (BID) | ORAL | 0 refills | Status: DC

## 2016-07-01 MED ORDER — OSELTAMIVIR PHOSPHATE 75 MG PO CAPS
ORAL_CAPSULE | ORAL | Status: AC
  Administered 2016-07-01: 75 mg via ORAL
  Filled 2016-07-01: qty 1

## 2016-07-01 MED ORDER — ALBUTEROL SULFATE (2.5 MG/3ML) 0.083% IN NEBU
2.5000 mg | INHALATION_SOLUTION | Freq: Once | RESPIRATORY_TRACT | Status: AC
  Administered 2016-07-01: 2.5 mg via RESPIRATORY_TRACT
  Filled 2016-07-01: qty 3

## 2016-07-01 NOTE — ED Notes (Signed)
Dr Dewayne Hatch notified of elevated HR. May still DC home

## 2016-07-01 NOTE — ED Notes (Signed)
Pulled urine from tube

## 2016-07-01 NOTE — ED Provider Notes (Signed)
[facility] DEPT Provider Note   CSN: [id number] Arrival date & time: 07/01/16  1243  By signing my name below, I, Ethelle Lyon Long, attest that this documentation has been prepared under the direction and in the presence of Milton Ferguson, MD . Electronically Signed: Ethelle Lyon Long, Scribe. 07/01/2016. 1:16 PM.    History   Chief Complaint Chief Complaint  Patient presents with  . flu like symptoms    The history is provided by the patient. No language interpreter was used.  Cough  This is a new problem. The current episode started 12 to 24 hours ago. The problem occurs every few minutes. The problem has been gradually worsening. The cough is non-productive. The maximum temperature recorded prior to her arrival was 100 to 100.9 F. Associated symptoms include chills, sore throat, myalgias (generalized) and shortness of breath. Pertinent negatives include no chest pain and no headaches.    HPI Comments:  [name] is a 51 y.o. female with a PMHx of UTI, Uterine cancer, and paraplegia, who presents to the Emergency Department complaining of a gradually worsening, persistent cough onset last night. She has associated symptoms of body aches, sore throat d/t cough, SOB,  fever, and chills. She notes a recent discharge from the hospital on 06/28/16 for a UTI but states her cough was not similar to her symptoms at that time. She denies any other associated symptoms at this time.   Past Medical History:  Diagnosis Date  . Abnormal uterine bleeding (AUB) 06/15/2014  . Cancer ([place])    uterine  . High blood pressure   . Paraplegia (lower)   . Seizure disorder ([place])   . Seizures ([place])   . Suprapubic catheter ([place])   . Urinary tract infection     Patient Active Problem List   Diagnosis Date Noted  . Altered mental status 06/22/2016  . Altered mental state 08/16/2015  . Pressure ulcer 05/16/2015  . Acute encephalopathy 05/15/2015  . Malnutrition ([place])   . Encephalopathy acute  10/10/2014  . Sepsis ([place]) 10/10/2014  . HCAP (healthcare-associated pneumonia) 10/10/2014  . Encephalomalacia 10/10/2014  . TBI (traumatic brain injury) ([place]) 10/10/2014  . Severe protein-calorie malnutrition ([place]) 10/10/2014  . UTI (urinary tract infection) 08/26/2014  . Hypokalemia 08/26/2014  . Abnormal uterine bleeding (AUB) 06/15/2014  . High blood pressure   . Seizure disorder ([place])   . Status epilepticus ([place]) 05/02/2014  . Hypertensive emergency without congestive heart failure 05/02/2014  . Acute renal failure ([place]) 05/21/2013  . Hyperkalemia 05/21/2013  . Hypothermia 05/21/2013  . Paraplegia ([place]) 06/25/2012  . Suprapubic catheter ([place]) 06/25/2012  . Decubitus ulcer of buttock 06/25/2012    Past Surgical History:  Procedure Laterality Date  . BACK SURGERY     Pt stated "before 2000"  . ESOPHAGOGASTRODUODENOSCOPY N/A 09/20/2015   Procedure: ESOPHAGOGASTRODUODENOSCOPY (EGD);  Surgeon: Rogene Houston, MD;  Location[facility];  Service: Endoscopy;  Laterality: N/A;  730  . PERCUTANEOUS ENDOSCOPIC GASTROSTOMY (PEG) REMOVAL N/A 09/20/2015   Procedure: PERCUTANEOUS ENDOSCOPIC GASTROSTOMY (PEG) REMOVAL;  Surgeon: Rogene Houston, MD;  Location: [facility];  Service: Endoscopy;  Laterality: N/A;    OB History    Gravida Para Term Preterm AB Living   4 3     1 3    SAB TAB Ectopic Multiple Live Births   1               Home Medications    Prior to Admission medications   Medication Sig  Start Date End Date Taking? Authorizing Provider  acetaminophen (TYLENOL) 325 MG tablet Take 650 mg by mouth every 6 (six) hours as needed. 06/02/16   Historical Provider, MD  amLODipine (NORVASC) 10 MG tablet Take 10 mg by mouth daily. 05/10/14   Historical Provider, MD  baclofen (LIORESAL) 10 MG tablet Take 1 tablet by mouth 3 (three) times daily.    Historical Provider, MD  ciprofloxacin (CIPRO) 500 MG tablet Take 1 tablet (500 mg total) by mouth 2 (two) times daily. 06/28/16    [name], MD  cloNIDine (CATAPRES) 0.2 MG tablet Take 0.2 mg by mouth 3 (three) times daily.    Historical Provider, MD  dantrolene (DANTRIUM) 25 MG capsule Take 50 mg by mouth 2 (two) times daily.    Historical Provider, MD  desmopressin (DDAVP) 0.2 MG tablet Take 1 tablet by mouth at bedtime. 02/02/16   Historical Provider, MD  docusate sodium (COLACE) 100 MG capsule Take 100 mg by mouth at bedtime.    Historical Provider, MD  fesoterodine (TOVIAZ) 8 MG TB24 tablet Take 8 mg by mouth daily.    Historical Provider, MD  gabapentin (NEURONTIN) 300 MG capsule Take 300 mg by mouth 3 (three) times daily.    Historical Provider, MD  HYDROcodone-acetaminophen (NORCO/VICODIN) 5-325 MG tablet Take 1 tablet by mouth every 6 (six) hours as needed. 06/02/16   Historical Provider, MD  iron polysaccharides (NIFEREX) 150 MG capsule Take 1 capsule (150 mg total) by mouth 2 (two) times daily. 06/28/16   [name], MD  levETIRAcetam (KEPPRA) 1000 MG tablet Take 1,000 mg by mouth 3 (three) times daily. 05/10/14   Historical Provider, MD  metoprolol tartrate (LOPRESSOR) 25 MG tablet Take 25 mg by mouth 2 (two) times daily. 05/10/14   Historical Provider, MD  Multiple Vitamins-Minerals (MULTIVITAMIN ADULT PO) Take 1 tablet by mouth daily.     Historical Provider, MD  nitrofurantoin, macrocrystal-monohydrate, (MACROBID) 100 MG capsule Take 100 mg by mouth 2 (two) times daily.    Historical Provider, MD  oxybutynin (DITROPAN) 5 MG tablet Take 5 mg by mouth 3 (three) times daily as needed. 06/02/16   Historical Provider, MD  pantoprazole (PROTONIX) 40 MG tablet Take 40 mg by mouth daily. 05/10/14   Historical Provider, MD  polyethylene glycol powder (GLYCOLAX/MIRALAX) powder Take 17 g by mouth daily as needed. 06/02/16   Historical Provider, MD  potassium chloride SA (K-DUR,KLOR-CON) 20 MEQ tablet Take 1 tablet by mouth daily.    Historical Provider, MD  silver nitrate applicators 65-78 % applicator Apply topically  daily. Patient taking differently: Apply 1 application topically daily.  09/20/15   Rogene Houston, MD  tiZANidine (ZANAFLEX) 4 MG tablet Take 4 mg by mouth 2 (two) times daily. 04/03/16   Historical Provider, MD  vitamin C (ASCORBIC ACID) 500 MG tablet Take 500 mg by mouth 2 (two) times daily.    Historical Provider, MD    Family History Family History  Problem Relation Age of Onset  . Cancer Mother   . Hypertension Mother   . Cancer Sister     breast and then spread everywhere.  . Diabetes Paternal Grandmother   . Hypertension Paternal Grandmother     Social History Social History  Substance Use Topics  . Smoking status: Never Smoker  . Smokeless tobacco: Never Used  . Alcohol use No     Allergies   Benadryl [diphenhydramine hcl (sleep)]; Quinine derivatives; Azithromycin; and Zosyn [piperacillin sod-tazobactam so]   Review of Systems Review  of Systems  Constitutional: Positive for chills and fever. Negative for appetite change and fatigue.  HENT: Positive for sore throat. Negative for congestion, ear discharge and sinus pressure.   Eyes: Negative for discharge.  Respiratory: Positive for cough and shortness of breath.   Cardiovascular: Negative for chest pain.  Gastrointestinal: Negative for abdominal pain and diarrhea.  Genitourinary: Negative for frequency and hematuria.  Musculoskeletal: Positive for myalgias (generalized). Negative for back pain.  Skin: Negative for rash.  Neurological: Negative for seizures and headaches.  Psychiatric/Behavioral: Negative for hallucinations.     Physical Exam Updated Vital Signs BP (!) 180/106   Pulse 113   Temp 99.6 F (37.6 C)   Resp 21   Ht 5\' 4"  (1.626 m)   Wt 140 lb (63.5 kg)   LMP 07/01/2016   SpO2 96%   BMI 24.03 kg/m   Physical Exam  Constitutional: She is oriented to person, place, and time. She appears well-developed.  HENT:  Head: Normocephalic.  Eyes: Conjunctivae and EOM are normal. No scleral  icterus.  Neck: Neck supple. No thyromegaly present.  Cardiovascular: Normal rate and regular rhythm.  Exam reveals no gallop and no friction rub.   No murmur heard. Pulmonary/Chest: No stridor. She has no wheezes. She has rales. She exhibits no tenderness.  Moderate bilateral crackles.  Abdominal: She exhibits no distension. There is no tenderness. There is no rebound.  Musculoskeletal: She exhibits no edema.  Cannot move lower extremities secondary to paraplegia.   Lymphadenopathy:    She has no cervical adenopathy.  Neurological: She is oriented to person, place, and time. She exhibits normal muscle tone. Coordination normal.  Skin: No rash noted. No erythema.  Psychiatric: She has a normal mood and affect. Her behavior is normal.     ED Treatments / Results  DIAGNOSTIC STUDIES:  Oxygen Saturation is 96% on [place] 2L, adequate by my interpretation.    COORDINATION OF CARE:  1:06 PM Discussed treatment plan with pt at bedside and pt agreed to plan.  Labs (all labs ordered are listed, but only abnormal results are displayed) Labs Reviewed - No data to display  EKG  EKG Interpretation None       Radiology No results found.  Procedures Procedures (including critical care time)  Medications Ordered in ED Medications - No data to display   Initial Impression / Assessment and Plan / ED Course  I have reviewed the triage vital signs and the nursing notes.  Pertinent labs & imaging results that were available during my care of the patient were reviewed by me and considered in my medical decision making (see chart for details).     Patient with influenza. Patient stats staying above 90. On room air. Patient prefers to be discharged home she just recently was discharged from hospital. She'll be sent home with Tamiflu and on albuterol inhaler. She'll follow-up with her PCP  Final Clinical Impressions(s) / ED Diagnoses   Final diagnoses:  None    New Prescriptions New  Prescriptions   No medications on file   The chart was scribed for me under my direct supervision.  I personally performed the history, physical, and medical decision making and all procedures in the evaluation of this patient.Milton Ferguson, MD 07/01/16 [id number]

## 2016-07-01 NOTE — Discharge Instructions (Signed)
Follow up with your md this week for recheck  °

## 2016-07-01 NOTE — ED Triage Notes (Signed)
Pt was discharged from hospital 06/28/16 On abt for UTI. Pt has chronic foley. Pt is paraplegic. Pt reports that last night she was coughing. Pt was restless during the night. Reports body aches and chills. Throat is sore and feels sob. Sats on RA 89-90 % O2 at 2 L/min started

## 2016-07-01 NOTE — ED Notes (Signed)
Oxygen cut off to check room air sats

## 2016-07-03 LAB — URINE CULTURE

## 2016-07-09 LAB — CULTURE, BLOOD (ROUTINE X 2): CULTURE: NO GROWTH

## 2017-04-25 ENCOUNTER — Encounter (HOSPITAL_COMMUNITY): Payer: Self-pay | Admitting: Emergency Medicine

## 2017-04-25 ENCOUNTER — Emergency Department (HOSPITAL_COMMUNITY): Payer: Medicaid Other

## 2017-04-25 ENCOUNTER — Other Ambulatory Visit: Payer: Self-pay

## 2017-04-25 ENCOUNTER — Inpatient Hospital Stay (HOSPITAL_COMMUNITY)
Admission: EM | Admit: 2017-04-25 | Discharge: 2017-04-29 | DRG: 699 | Disposition: A | Discharge status: Home Health | Payer: Medicaid Other | Attending: Internal Medicine | Admitting: Internal Medicine

## 2017-04-25 DIAGNOSIS — Z9359 Other cystostomy status: Secondary | ICD-10-CM

## 2017-04-25 DIAGNOSIS — Z888 Allergy status to other drugs, medicaments and biological substances status: Secondary | ICD-10-CM

## 2017-04-25 DIAGNOSIS — Z931 Gastrostomy status: Secondary | ICD-10-CM | POA: Diagnosis not present

## 2017-04-25 DIAGNOSIS — N390 Urinary tract infection, site not specified: Secondary | ICD-10-CM | POA: Diagnosis present

## 2017-04-25 DIAGNOSIS — Z791 Long term (current) use of non-steroidal anti-inflammatories (NSAID): Secondary | ICD-10-CM | POA: Diagnosis not present

## 2017-04-25 DIAGNOSIS — N179 Acute kidney failure, unspecified: Secondary | ICD-10-CM | POA: Diagnosis not present

## 2017-04-25 DIAGNOSIS — D631 Anemia in chronic kidney disease: Secondary | ICD-10-CM | POA: Diagnosis present

## 2017-04-25 DIAGNOSIS — T83511A Infection and inflammatory reaction due to indwelling urethral catheter, initial encounter: Secondary | ICD-10-CM

## 2017-04-25 DIAGNOSIS — N119 Chronic tubulo-interstitial nephritis, unspecified: Secondary | ICD-10-CM | POA: Diagnosis present

## 2017-04-25 DIAGNOSIS — G40909 Epilepsy, unspecified, not intractable, without status epilepticus: Secondary | ICD-10-CM | POA: Diagnosis not present

## 2017-04-25 DIAGNOSIS — N184 Chronic kidney disease, stage 4 (severe): Secondary | ICD-10-CM | POA: Diagnosis not present

## 2017-04-25 DIAGNOSIS — Y92410 Unspecified street and highway as the place of occurrence of the external cause: Secondary | ICD-10-CM

## 2017-04-25 DIAGNOSIS — D509 Iron deficiency anemia, unspecified: Secondary | ICD-10-CM | POA: Diagnosis present

## 2017-04-25 DIAGNOSIS — G8220 Paraplegia, unspecified: Secondary | ICD-10-CM | POA: Diagnosis present

## 2017-04-25 DIAGNOSIS — Z79899 Other long term (current) drug therapy: Secondary | ICD-10-CM | POA: Diagnosis not present

## 2017-04-25 DIAGNOSIS — E872 Acidosis, unspecified: Secondary | ICD-10-CM | POA: Diagnosis present

## 2017-04-25 DIAGNOSIS — E860 Dehydration: Secondary | ICD-10-CM | POA: Diagnosis not present

## 2017-04-25 DIAGNOSIS — Z881 Allergy status to other antibiotic agents status: Secondary | ICD-10-CM | POA: Diagnosis not present

## 2017-04-25 DIAGNOSIS — Z66 Do not resuscitate: Secondary | ICD-10-CM | POA: Diagnosis present

## 2017-04-25 DIAGNOSIS — T83510A Infection and inflammatory reaction due to cystostomy catheter, initial encounter: Secondary | ICD-10-CM | POA: Diagnosis present

## 2017-04-25 DIAGNOSIS — I129 Hypertensive chronic kidney disease with stage 1 through stage 4 chronic kidney disease, or unspecified chronic kidney disease: Secondary | ICD-10-CM | POA: Diagnosis present

## 2017-04-25 DIAGNOSIS — Z880 Allergy status to penicillin: Secondary | ICD-10-CM | POA: Diagnosis not present

## 2017-04-25 DIAGNOSIS — Y846 Urinary catheterization as the cause of abnormal reaction of the patient, or of later complication, without mention of misadventure at the time of the procedure: Secondary | ICD-10-CM | POA: Diagnosis present

## 2017-04-25 LAB — URINALYSIS, ROUTINE W REFLEX MICROSCOPIC
Bilirubin Urine: NEGATIVE
Hgb urine dipstick: NEGATIVE
KETONES UR: NEGATIVE mg/dL
Nitrite: POSITIVE — AB
Specific Gravity, Urine: 1.011 (ref 1.005–1.030)
pH: 5 (ref 5.0–8.0)

## 2017-04-25 LAB — COMPREHENSIVE METABOLIC PANEL
ALBUMIN: 2.8 g/dL — AB (ref 3.5–5.0)
ALT: 14 U/L (ref 14–54)
AST: 16 U/L (ref 15–41)
Alkaline Phosphatase: 107 U/L (ref 38–126)
Anion gap: 8 (ref 5–15)
BUN: 33 mg/dL — AB (ref 6–20)
CHLORIDE: 120 mmol/L — AB (ref 101–111)
CO2: 12 mmol/L — AB (ref 22–32)
CREATININE: 4.45 mg/dL — AB (ref 0.44–1.00)
Calcium: 8.1 mg/dL — ABNORMAL LOW (ref 8.9–10.3)
GFR calc Af Amer: 12 mL/min — ABNORMAL LOW (ref >60)
GFR, EST NON AFRICAN AMERICAN: 11 mL/min — AB (ref >60)
GLUCOSE: 97 mg/dL (ref 65–99)
POTASSIUM: 4.5 mmol/L (ref 3.5–5.1)
Sodium: 140 mmol/L (ref 135–145)
Total Bilirubin: 0.3 mg/dL (ref 0.3–1.2)
Total Protein: 6.4 g/dL — ABNORMAL LOW (ref 6.5–8.1)

## 2017-04-25 LAB — RETICULOCYTES
RBC.: 2.29 MIL/uL — ABNORMAL LOW (ref 3.87–5.11)
RETIC COUNT ABSOLUTE: 59.5 10*3/uL (ref 19.0–186.0)
Retic Ct Pct: 2.6 % (ref 0.4–3.1)

## 2017-04-25 LAB — CBC WITH DIFFERENTIAL/PLATELET
Basophils Absolute: 0 10*3/uL (ref 0.0–0.1)
Basophils Relative: 1 %
EOS ABS: 0.2 10*3/uL (ref 0.0–0.7)
EOS PCT: 3 %
HCT: 23.2 % — ABNORMAL LOW (ref 36.0–46.0)
Hemoglobin: 7.3 g/dL — ABNORMAL LOW (ref 12.0–15.0)
LYMPHS ABS: 1.8 10*3/uL (ref 0.7–4.0)
LYMPHS PCT: 24 %
MCH: 29.9 pg (ref 26.0–34.0)
MCHC: 31.5 g/dL (ref 30.0–36.0)
MCV: 95.1 fL (ref 78.0–100.0)
MONO ABS: 0.3 10*3/uL (ref 0.1–1.0)
MONOS PCT: 4 %
Neutro Abs: 5.3 10*3/uL (ref 1.7–7.7)
Neutrophils Relative %: 68 %
PLATELETS: 244 10*3/uL (ref 150–400)
RBC: 2.44 MIL/uL — AB (ref 3.87–5.11)
RDW: 15.6 % — ABNORMAL HIGH (ref 11.5–15.5)
WBC: 7.6 10*3/uL (ref 4.0–10.5)

## 2017-04-25 LAB — FOLATE: FOLATE: 13 ng/mL (ref >5.9)

## 2017-04-25 LAB — PREPARE RBC (CROSSMATCH)

## 2017-04-25 LAB — LACTIC ACID, PLASMA
LACTIC ACID, VENOUS: 0.6 mmol/L (ref 0.5–1.9)
Lactic Acid, Venous: 0.5 mmol/L (ref 0.5–1.9)

## 2017-04-25 LAB — IRON AND TIBC
IRON: 38 ug/dL (ref 28–170)
Saturation Ratios: 16 % (ref 10.4–31.8)
TIBC: 238 ug/dL — ABNORMAL LOW (ref 250–450)
UIBC: 200 ug/dL

## 2017-04-25 LAB — VITAMIN B12: Vitamin B-12: 309 pg/mL (ref 180–914)

## 2017-04-25 LAB — FERRITIN: Ferritin: 30 ng/mL (ref 11–307)

## 2017-04-25 LAB — MRSA PCR SCREENING: MRSA by PCR: POSITIVE — AB

## 2017-04-25 MED ORDER — DOCUSATE SODIUM 100 MG PO CAPS
100.0000 mg | ORAL_CAPSULE | Freq: Every day | ORAL | Status: DC
  Administered 2017-04-25 – 2017-04-28 (×4): 100 mg via ORAL
  Filled 2017-04-25 (×4): qty 1

## 2017-04-25 MED ORDER — SODIUM CHLORIDE 0.45 % IV SOLN
INTRAVENOUS | Status: DC
  Administered 2017-04-25 – 2017-04-28 (×6): via INTRAVENOUS
  Filled 2017-04-25 (×9): qty 1000

## 2017-04-25 MED ORDER — SODIUM CHLORIDE 0.9 % IV BOLUS (SEPSIS)
1000.0000 mL | Freq: Once | INTRAVENOUS | Status: AC
  Administered 2017-04-25: 1000 mL via INTRAVENOUS

## 2017-04-25 MED ORDER — BACLOFEN 10 MG PO TABS
20.0000 mg | ORAL_TABLET | Freq: Every day | ORAL | Status: DC
  Administered 2017-04-26 – 2017-04-29 (×4): 20 mg via ORAL
  Filled 2017-04-25 (×4): qty 2

## 2017-04-25 MED ORDER — METOPROLOL TARTRATE 25 MG PO TABS
25.0000 mg | ORAL_TABLET | Freq: Two times a day (BID) | ORAL | Status: DC
  Administered 2017-04-25 – 2017-04-29 (×8): 25 mg via ORAL
  Filled 2017-04-25 (×8): qty 1

## 2017-04-25 MED ORDER — ONDANSETRON HCL 4 MG PO TABS: 4.0000 mg | ORAL_TABLET | Freq: Four times a day (QID) | ORAL | Status: DC | PRN

## 2017-04-25 MED ORDER — CLONIDINE HCL 0.2 MG PO TABS
0.2000 mg | ORAL_TABLET | Freq: Once | ORAL | Status: AC
  Administered 2017-04-25: 0.2 mg via ORAL
  Filled 2017-04-25: qty 1

## 2017-04-25 MED ORDER — ONDANSETRON HCL 4 MG/2ML IJ SOLN: 4.0000 mg | Freq: Four times a day (QID) | INTRAMUSCULAR | Status: DC | PRN

## 2017-04-25 MED ORDER — DESMOPRESSIN ACETATE 0.2 MG PO TABS
200.0000 ug | ORAL_TABLET | Freq: Every day | ORAL | Status: DC
  Administered 2017-04-25 – 2017-04-28 (×4): 200 ug via ORAL
  Filled 2017-04-25 (×5): qty 1

## 2017-04-25 MED ORDER — ACETAMINOPHEN 650 MG RE SUPP: 650.0000 mg | Freq: Four times a day (QID) | RECTAL | Status: DC | PRN

## 2017-04-25 MED ORDER — ACETAMINOPHEN 325 MG PO TABS
650.0000 mg | ORAL_TABLET | Freq: Four times a day (QID) | ORAL | Status: DC | PRN
  Administered 2017-04-26 – 2017-04-28 (×3): 650 mg via ORAL
  Filled 2017-04-25 (×3): qty 2

## 2017-04-25 MED ORDER — HEPARIN SODIUM (PORCINE) 5000 UNIT/ML IJ SOLN
5000.0000 [IU] | Freq: Three times a day (TID) | INTRAMUSCULAR | Status: DC
  Administered 2017-04-25 – 2017-04-29 (×13): 5000 [IU] via SUBCUTANEOUS
  Filled 2017-04-25 (×13): qty 1

## 2017-04-25 MED ORDER — SODIUM CHLORIDE 0.9 % IV SOLN
Freq: Once | INTRAVENOUS | Status: AC
  Administered 2017-04-25: 18:00:00 via INTRAVENOUS

## 2017-04-25 MED ORDER — CLONIDINE HCL 0.2 MG PO TABS
0.2000 mg | ORAL_TABLET | Freq: Three times a day (TID) | ORAL | Status: DC
  Administered 2017-04-25 – 2017-04-29 (×11): 0.2 mg via ORAL
  Filled 2017-04-25 (×11): qty 1

## 2017-04-25 MED ORDER — DEXTROSE 5 % IV SOLN
1.0000 g | Freq: Once | INTRAVENOUS | Status: AC
  Administered 2017-04-25: 1 g via INTRAVENOUS
  Filled 2017-04-25: qty 10

## 2017-04-25 MED ORDER — LEVETIRACETAM 500 MG PO TABS
1000.0000 mg | ORAL_TABLET | Freq: Three times a day (TID) | ORAL | Status: DC
  Administered 2017-04-25 – 2017-04-29 (×12): 1000 mg via ORAL
  Filled 2017-04-25 (×12): qty 2

## 2017-04-25 MED ORDER — DEXTROSE 5 % IV SOLN
1.0000 g | INTRAVENOUS | Status: DC
  Administered 2017-04-26 – 2017-04-29 (×4): 1 g via INTRAVENOUS
  Filled 2017-04-25 (×5): qty 10

## 2017-04-25 NOTE — ED Notes (Signed)
Went to introduce myself to pt and pt was able to speak clearly to me.

## 2017-04-25 NOTE — ED Triage Notes (Addendum)
Pt is a paraplegic from home. Per EMS, daughter found patient with decreased consciousness this morning. States this is typical when she has a UTI. Pt with suprapubic catheter with cloudy urine x 2 days. Pt unable to move LT arm which is baseline. Pt alert at this time. CBG 113.
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
## 2017-04-25 NOTE — ED Notes (Addendum)
Pt has very short veins. Have established an IV in left AC. Part of catheter is sticking out. Have taped IV down securely. Fluids are flowing without an issue. No infiltration. Have notified pt to let this nurse know if any burning or swelling occurs.
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
## 2017-04-26 LAB — CBC
HCT: 27 % — ABNORMAL LOW (ref 36.0–46.0)
HEMOGLOBIN: 8.8 g/dL — AB (ref 12.0–15.0)
MCH: 29.4 pg (ref 26.0–34.0)
MCHC: 32.6 g/dL (ref 30.0–36.0)
MCV: 90.3 fL (ref 78.0–100.0)
Platelets: 203 10*3/uL (ref 150–400)
RBC: 2.99 MIL/uL — ABNORMAL LOW (ref 3.87–5.11)
RDW: 16 % — ABNORMAL HIGH (ref 11.5–15.5)
WBC: 7.6 10*3/uL (ref 4.0–10.5)

## 2017-04-26 LAB — BASIC METABOLIC PANEL
Anion gap: 5 (ref 5–15)
BUN: 35 mg/dL — ABNORMAL HIGH (ref 6–20)
CHLORIDE: 113 mmol/L — AB (ref 101–111)
CO2: 17 mmol/L — ABNORMAL LOW (ref 22–32)
CREATININE: 3.92 mg/dL — AB (ref 0.44–1.00)
Calcium: 7.3 mg/dL — ABNORMAL LOW (ref 8.9–10.3)
GFR calc non Af Amer: 12 mL/min — ABNORMAL LOW (ref >60)
GFR, EST AFRICAN AMERICAN: 14 mL/min — AB (ref >60)
Glucose, Bld: 99 mg/dL (ref 65–99)
POTASSIUM: 3.4 mmol/L — AB (ref 3.5–5.1)
SODIUM: 135 mmol/L (ref 135–145)

## 2017-04-26 LAB — TYPE AND SCREEN
ABO/RH(D): A POS
Antibody Screen: NEGATIVE
Unit division: 0

## 2017-04-26 LAB — BPAM RBC
Blood Product Expiration Date: 201812192359
ISSUE DATE / TIME: 201811301820
Unit Type and Rh: 6200

## 2017-04-26 LAB — HIV ANTIBODY (ROUTINE TESTING W REFLEX): HIV SCREEN 4TH GENERATION: NONREACTIVE

## 2017-04-26 MED ORDER — POTASSIUM CHLORIDE CRYS ER 20 MEQ PO TBCR
40.0000 meq | EXTENDED_RELEASE_TABLET | ORAL | Status: AC
  Administered 2017-04-26 (×2): 40 meq via ORAL
  Filled 2017-04-26 (×2): qty 2
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
## 2017-04-27 ENCOUNTER — Inpatient Hospital Stay (HOSPITAL_COMMUNITY): Payer: Medicaid Other

## 2017-04-27 LAB — BASIC METABOLIC PANEL
ANION GAP: 6 (ref 5–15)
BUN: 39 mg/dL — ABNORMAL HIGH (ref 6–20)
CO2: 17 mmol/L — AB (ref 22–32)
Calcium: 6.7 mg/dL — ABNORMAL LOW (ref 8.9–10.3)
Chloride: 112 mmol/L — ABNORMAL HIGH (ref 101–111)
Creatinine, Ser: 3.7 mg/dL — ABNORMAL HIGH (ref 0.44–1.00)
GFR, EST AFRICAN AMERICAN: 15 mL/min — AB (ref >60)
GFR, EST NON AFRICAN AMERICAN: 13 mL/min — AB (ref >60)
GLUCOSE: 92 mg/dL (ref 65–99)
POTASSIUM: 5 mmol/L (ref 3.5–5.1)
Sodium: 135 mmol/L (ref 135–145)

## 2017-04-27 LAB — URINE CULTURE

## 2017-04-27 MED ORDER — CHLORHEXIDINE GLUCONATE CLOTH 2 % EX PADS
6.0000 | MEDICATED_PAD | Freq: Every day | CUTANEOUS | Status: DC
  Administered 2017-04-27 – 2017-04-29 (×3): 6 via TOPICAL

## 2017-04-27 MED ORDER — MUPIROCIN 2 % EX OINT
1.0000 "application " | TOPICAL_OINTMENT | Freq: Two times a day (BID) | CUTANEOUS | Status: DC
  Administered 2017-04-27 – 2017-04-29 (×5): 1 via NASAL
  Filled 2017-04-27: qty 22

## 2017-04-27 NOTE — Progress Notes (Signed)
Note to nursing from pharmacy to consider decreasing Keppra dosage d/t renal function. Discussed with Dr. Roderic Palau. Stated okay to continue current Keppra dose. Donavan Foil, RN
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
## 2017-04-28 LAB — CBC
HEMATOCRIT: 24.2 % — AB (ref 36.0–46.0)
Hemoglobin: 7.7 g/dL — ABNORMAL LOW (ref 12.0–15.0)
MCH: 28.6 pg (ref 26.0–34.0)
MCHC: 31.8 g/dL (ref 30.0–36.0)
MCV: 90 fL (ref 78.0–100.0)
Platelets: 178 10*3/uL (ref 150–400)
RBC: 2.69 MIL/uL — AB (ref 3.87–5.11)
RDW: 15.7 % — AB (ref 11.5–15.5)
WBC: 7 10*3/uL (ref 4.0–10.5)

## 2017-04-28 LAB — BASIC METABOLIC PANEL
ANION GAP: 5 (ref 5–15)
BUN: 47 mg/dL — AB (ref 6–20)
CO2: 21 mmol/L — ABNORMAL LOW (ref 22–32)
Calcium: 6.5 mg/dL — ABNORMAL LOW (ref 8.9–10.3)
Chloride: 110 mmol/L (ref 101–111)
Creatinine, Ser: 3.71 mg/dL — ABNORMAL HIGH (ref 0.44–1.00)
GFR calc non Af Amer: 13 mL/min — ABNORMAL LOW (ref >60)
GFR, EST AFRICAN AMERICAN: 15 mL/min — AB (ref >60)
Glucose, Bld: 106 mg/dL — ABNORMAL HIGH (ref 65–99)
POTASSIUM: 4.5 mmol/L (ref 3.5–5.1)
SODIUM: 136 mmol/L (ref 135–145)

## 2017-04-28 MED ORDER — FUROSEMIDE 10 MG/ML IJ SOLN
60.0000 mg | Freq: Two times a day (BID) | INTRAMUSCULAR | Status: DC
  Administered 2017-04-28: 60 mg via INTRAVENOUS
  Filled 2017-04-28: qty 6

## 2017-04-28 MED ORDER — SODIUM CHLORIDE 0.45 % IV SOLN
INTRAVENOUS | Status: DC
  Administered 2017-04-28 – 2017-04-29 (×3): via INTRAVENOUS
  Filled 2017-04-28 (×7): qty 1000
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
## 2017-04-28 NOTE — Progress Notes (Addendum)
PROGRESS NOTE    [name]  KYH:[id number] DOB: 06/27/1965 DOA: 04/25/2017 PCP: [name], MD    Brief Narrative:  51 year old female with history of paraplegia and suprapubic catheter, brought to the hospital with increasing somnolence.  Found to be dehydrated, urinary tract infection worsening renal failure.  She also had worsening anemia of chronic disease.  Patient was started on IV fluids, antibiotics and received 1 unit of PRBC for anemia.  Clinically, she is improving.  Urine culture in process.   Assessment & Plan:   Active Problems:   Paraplegia (HCC)   Suprapubic catheter (HCC)   Seizure disorder (HCC)   UTI (urinary tract infection)   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 chronic kidney disease (HCC)   AKI (acute kidney injury) (HCC)   Dehydration   CKD (chronic kidney disease) stage 4, GFR 15-29 ml/min (HCC)   Metabolic acidosis   1. Urinary tract infection due to chronic suprapubic catheter.  Urinalysis indicates possible infection.  She is on intravenous antibiotics. Urine culture shows multiple organisms.  Will discontinue after tomorrow's dose. 2. Somnolence/altered mental status.  Likely related to dehydration and urinary tract infection.    Improved after IV fluids.. 3. Acute kidney injury on chronic kidney disease stage IV.  Likely related to dehydration, volume depletion as well as concurrent NSAID use.    Creatinine has had mild improvement after receiving IV fluids. Renal ultrasound does not show any acute changes.  Nephrology consulted. 4. Anemia, likely related to chronic kidney disease.  No reports of bleeding.  Anemia panel indicates chronic disease.  Hemoglobin has improved after transfusion of 1 unit of PRBC.  Continue to monitor 5. Metabolic acidosis.  Suspect is related to kidney disease as well as dehydration.  Will continue on bicarbonate infusion. 6. Seizure disorder.  Continue on home dose of Keppra 7. Paraplegia.  Chronic.   DVT prophylaxis:  Heparin Code Status: DNR Family Communication: Discussed with daughter over the phone Disposition Plan: Discharge home once improved   Consultants:     Procedures:     Antimicrobials:   Rocephin 11/30>   Subjective: Has noticed some swelling developing in feet.  Denies any shortness of breath.  Objective: Vitals:   04/27/17 0646 04/27/17 1658 04/27/17 2052 04/28/17 1441  BP: (!) 151/67 (!) 157/73 (!) 148/71 (!) 170/86  Pulse: 60 69 64 66  Resp: 18 18 18 18   Temp: 98.7 F (37.1 C) 98.6 F (37 C) 97.8 F (36.6 C) 98.5 F (36.9 C)  TempSrc: Oral  Oral Oral  SpO2: 98% 97% 97% 100%  Weight:      Height:        Intake/Output Summary (Last 24 hours) at 04/28/2017 1820 Last data filed at 04/28/2017 1700 Gross per 24 hour  Intake 3295 ml  Output 3450 ml  Net -155 ml   Filed Weights   04/25/17 1003  Weight: 63.5 kg (140 lb)    Examination:  General exam: Appears calm and comfortable  Respiratory system: Clear to auscultation. Respiratory effort normal. Cardiovascular system: S1 & S2 heard, RRR. No JVD, murmurs, rubs, gallops or clicks. 1+ pedal edema. Gastrointestinal system: Abdomen is nondistended, soft and nontender. No organomegaly or masses felt. Normal bowel sounds heard. Suprapubic catheter present Central nervous system: Alert and oriented. Paraplegia in LE Extremities: Symmetric no cyanosis or clubbing Skin: No rashes, lesions or ulcers Psychiatry: Judgement and insight appear normal. Mood & affect appropriate.     Data Reviewed: I have personally reviewed following labs and imaging  studies  CBC: Recent Labs  Lab 04/25/17 1140 04/26/17 0907 04/28/17 0538  WBC 7.6 7.6 7.0  NEUTROABS 5.3  --   --   HGB 7.3* 8.8* 7.7*  HCT 23.2* 27.0* 24.2*  MCV 95.1 90.3 90.0  PLT 244 203 382   Basic Metabolic Panel: Recent Labs  Lab 04/25/17 1140 04/26/17 0907 04/27/17 0545 04/28/17 0538  NA 140 135 135 136  K 4.5 3.4* 5.0 4.5  CL 120* 113* 112* 110   CO2 12* 17* 17* 21*  GLUCOSE 97 99 92 106*  BUN 33* 35* 39* 47*  CREATININE 4.45* 3.92* 3.70* 3.71*  CALCIUM 8.1* 7.3* 6.7* 6.5*   GFR: Estimated Creatinine Clearance: 16.4 mL/min (A) (by C-G formula based on SCr of 3.71 mg/dL (H)). Liver Function Tests: Recent Labs  Lab 04/25/17 1140  AST 16  ALT 14  ALKPHOS 107  BILITOT 0.3  PROT 6.4*  ALBUMIN 2.8*   No results for input(s): LIPASE, AMYLASE in the last 168 hours. No results for input(s): AMMONIA in the last 168 hours. Coagulation Profile: No results for input(s): INR, PROTIME in the last 168 hours. Cardiac Enzymes: No results for input(s): CKTOTAL, CKMB, CKMBINDEX, TROPONINI in the last 168 hours. BNP (last 3 results) No results for input(s): PROBNP in the last 8760 hours. HbA1C: No results for input(s): HGBA1C in the last 72 hours. CBG: No results for input(s): GLUCAP in the last 168 hours. Lipid Profile: No results for input(s): CHOL, HDL, LDLCALC, TRIG, CHOLHDL, LDLDIRECT in the last 72 hours. Thyroid Function Tests: No results for input(s): TSH, T4TOTAL, FREET4, T3FREE, THYROIDAB in the last 72 hours. Anemia Panel: No results for input(s): VITAMINB12, FOLATE, FERRITIN, TIBC, IRON, RETICCTPCT in the last 72 hours. Sepsis Labs: Recent Labs  Lab 04/25/17 1140 04/25/17 1350  LATICACIDVEN 0.6 0.5    Recent Results (from the past 240 hour(s))  Urine culture     Status: Abnormal   Collection Time: 04/25/17 11:35 AM  Result Value Ref Range Status   Specimen Description URINE, CLEAN CATCH  Final   Special Requests NONE  Final   Culture MULTIPLE SPECIES PRESENT, SUGGEST RECOLLECTION (A)  Final   Report Status 04/27/2017 FINAL  Final  MRSA PCR Screening     Status: Abnormal   Collection Time: 04/25/17  6:00 PM  Result Value Ref Range Status   MRSA by PCR POSITIVE (A) NEGATIVE Final    Comment:        The GeneXpert MRSA Assay (FDA approved for NASAL specimens only), is one component of a comprehensive MRSA  colonization surveillance program. It is not intended to diagnose MRSA infection nor to guide or monitor treatment for MRSA infections. RESULT CALLED TO, READ BACK BY AND VERIFIED WITH:  MARTIN,J @ 2029 ON 04/25/17 BY JUW          Radiology Studies: US Renal  Result Date: 04/27/2017 CLINICAL DATA:  Acute kidney injury.  UTI. EXAM: RENAL / URINARY TRACT ULTRASOUND COMPLETE COMPARISON:  06/24/2016 FINDINGS: Right Kidney: Length: 7.9 cm. Cortical thinning and scarring. Small exophytic cyst off the upper pole measures 10 mm. Slight pelviectasis without frank hydronephrosis. Left Kidney: Length: 11.4 cm. Mildly nodular contours. No mass or hydronephrosis. Bladder: Decompressed with Foley catheter in place. Small right pleural effusion noted. IMPRESSION: Atrophic right kidney with cortical thinning and scarring. Slight pelviectasis. No acute findings on the left. Electronically Signed   By: Rolm Baptise M.D.   On: 04/27/2017 10:32        Scheduled  Meds: . baclofen  20 mg Oral Daily  . Chlorhexidine Gluconate Cloth  6 each Topical Q0600  . cloNIDine  0.2 mg Oral TID  . desmopressin  200 mcg Oral QHS  . docusate sodium  100 mg Oral QHS  . furosemide  60 mg Intravenous BID  . heparin  5,000 Units Subcutaneous Q8H  . levETIRAcetam  1,000 mg Oral TID  . metoprolol tartrate  25 mg Oral BID  . mupirocin ointment  1 application Nasal BID   Continuous Infusions: . cefTRIAXone (ROCEPHIN)  IV Stopped (04/28/17 1341)  . sodium chloride 0.45 % 1,000 mL with sodium bicarbonate 50 mEq infusion 135 mL/hr at 04/28/17 1311     LOS: 3 days    Time spent: 69mins    Kathie Dike, MD [facility] Pager [phone]  If 7PM-7AM, please contact night-coverage [email or web address] Password [email or web address] 04/28/2017, 6:20 PM

## 2017-04-29 LAB — CBC
HEMATOCRIT: 25.2 % — AB (ref 36.0–46.0)
Hemoglobin: 8 g/dL — ABNORMAL LOW (ref 12.0–15.0)
MCH: 28.9 pg (ref 26.0–34.0)
MCHC: 31.7 g/dL (ref 30.0–36.0)
MCV: 91 fL (ref 78.0–100.0)
Platelets: 186 10*3/uL (ref 150–400)
RBC: 2.77 MIL/uL — ABNORMAL LOW (ref 3.87–5.11)
RDW: 15.5 % (ref 11.5–15.5)
WBC: 6.9 10*3/uL (ref 4.0–10.5)

## 2017-04-29 LAB — RENAL FUNCTION PANEL
ALBUMIN: 1.9 g/dL — AB (ref 3.5–5.0)
Anion gap: 6 (ref 5–15)
BUN: 49 mg/dL — AB (ref 6–20)
CALCIUM: 6.7 mg/dL — AB (ref 8.9–10.3)
CO2: 22 mmol/L (ref 22–32)
Chloride: 107 mmol/L (ref 101–111)
Creatinine, Ser: 3.72 mg/dL — ABNORMAL HIGH (ref 0.44–1.00)
GFR calc Af Amer: 15 mL/min — ABNORMAL LOW (ref >60)
GFR calc non Af Amer: 13 mL/min — ABNORMAL LOW (ref >60)
GLUCOSE: 109 mg/dL — AB (ref 65–99)
PHOSPHORUS: 5 mg/dL — AB (ref 2.5–4.6)
Potassium: 4.1 mmol/L (ref 3.5–5.1)
SODIUM: 135 mmol/L (ref 135–145)

## 2017-04-29 MED ORDER — SODIUM BICARBONATE 8.4 % IV SOLN
INTRAVENOUS | Status: AC
  Filled 2017-04-29: qty 50

## 2017-04-29 MED ORDER — FUROSEMIDE 10 MG/ML IJ SOLN
20.0000 mg | Freq: Two times a day (BID) | INTRAMUSCULAR | Status: DC
  Administered 2017-04-29: 20 mg via INTRAVENOUS
  Filled 2017-04-29: qty 2

## 2017-04-29 MED ORDER — FUROSEMIDE 10 MG/ML IJ SOLN: 20.0000 mg | Freq: Two times a day (BID) | INTRAMUSCULAR | Status: DC

## 2017-04-29 MED ORDER — CEPHALEXIN 500 MG PO CAPS: 500.0000 mg | ORAL_CAPSULE | Freq: Three times a day (TID) | ORAL | 0 refills | Status: AC

## 2017-04-29 NOTE — Progress Notes (Signed)
Patient being discharged home today. Reviewed discharge instructions with patient. Given copy of AVS, prescription sent to her pharmacy per MD. Pt and children aware to pick it up. IV site d/c'd, site within normal limits. Pt and children verbalized understanding of discharge instructions. Home health arranged to resume at discharge per [facility] with [facility]. Pt in stable condition awaiting w/c and nurse tech to accompany her off unit for discharge. Donavan Foil, RN
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
## 2017-04-29 NOTE — Care Management Note (Addendum)
Case Management Note  Patient Details  Name: [name] MRN: [id number] Date of Birth: 1966-05-17  Subjective/Objective:   Adm with UTI. From home, history of paraplegia and suprapubic catheter. Catheter changed Sunday per nurse. She has HHA and HH RN with [facility].                Action/Plan: Anticipate DC home with resumption of HH. Patient states family will transport her home.   Expected Discharge Date:     12 /09/2016             Expected Discharge Plan:  [place]  In-House Referral:     Discharge planning Services  CM Consult  Post Acute Care Choice:  Home Health, Resumption of Svcs/PTA Provider Choice offered to:  Patient  DME Arranged:    DME Agency:     HH Arranged:    [place] Agency:  [place]  Status of Service:  Completed, signed off  If discussed at [place] of Stay Meetings, dates discussed:    Additional Comments:  Camera Krienke, Chauncey [place], RN 04/29/2017, 11:54 AM

## 2017-04-29 NOTE — Discharge Summary (Signed)
Physician Discharge Summary  Atlee Villers XLK:[id number] DOB: [birth year] DOA: 04/25/2017  PCP: [name], MD  Admit date: 04/25/2017 Discharge date: 04/29/2017  Admitted From: Home Disposition:  home  Recommendations for Outpatient Follow-up:  1. Follow up with PCP in 1-2 weeks 2. Please obtain BMP/CBC in one week 3. Follow-up with nephrology in 4 weeks  Home Health: Equipment/Devices:  Discharge Condition: Stable CODE STATUS: DNR Diet recommendation: Heart Healthy  Brief/Interim Summary: 51 year old female with history of paraplegia and suprapubic catheter, brought to the hospital with increasing somnolence.  Found to be dehydrated, urinary tract infection worsening renal failure.  She also had worsening anemia of chronic disease.  Patient was started on IV fluids, antibiotics and received 1 unit of PRBC for anemia.  Discharge Diagnoses:  Active Problems:   Paraplegia (HCC)   Suprapubic catheter (HCC)   Seizure disorder (HCC)   UTI (urinary tract infection)   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 chronic kidney disease (HCC)   AKI (acute kidney injury) (HCC)   Dehydration   CKD (chronic kidney disease) stage 4, GFR 15-29 ml/min (HCC)   Metabolic acidosis  1. Urinary tract infection due to chronic suprapubic catheter. Urinalysis indicated infection. She was treated with intravenous antibiotics. Urine culture shows multiple organisms.   2. Somnolence/altered mental status. Likely related to dehydration and urinary tract infection.   Improved after IV fluids. 3. Acute kidney injury on chronic kidney disease stage IV. Likely related to dehydration, volume depletion as well as concurrent NSAID use.   Creatinine has had mild improvement after receiving IV fluids. Renal ultrasound does not show any acute changes.  Nephrology consulted and patient was treated briefly with IV fluids as well as intravenous Lasix.  Creatinine appears to have plateaued.  Suspect that her recovery may be  extended out over several weeks.  Nephrology has recommended follow-up in 4 weeks with repeat labs.  NSAIDs have been discontinued. 4. Anemia, likely related to chronic kidney disease. No reports of bleeding.  Anemia panel indicates chronic disease. Hemoglobin has improved after transfusion of 1 unit of PRBC.   5. Metabolic acidosis. Suspect is related to kidney disease as well as dehydration. Improved with bicarbonate infusion. 6. Seizure disorder. Continue on home dose of Keppra 7. Paraplegia. Chronic.  Discharge Instructions  Discharge Instructions    Diet - low sodium heart healthy   Complete by:  As directed    Increase activity slowly   Complete by:  As directed      Allergies as of 04/29/2017      Reactions   Benadryl [diphenhydramine Hcl (sleep)] Hives   Quinine Derivatives Other (See Comments)   Alters mental status   Azithromycin Itching, Rash   Zosyn [piperacillin Sod-tazobactam So] Rash      Medication List    STOP taking these medications   naproxen 500 MG tablet Commonly known as:  NAPROSYN     TAKE these medications   acetaminophen 325 MG tablet Commonly known as:  TYLENOL Take 650 mg by mouth every 6 (six) hours as needed.   baclofen 20 MG tablet Commonly known as:  LIORESAL Take 20 mg by mouth daily.   cephALEXin 500 MG capsule Commonly known as:  KEFLEX Take 1 capsule (500 mg total) by mouth 3 (three) times daily for 10 days.   cloNIDine 0.2 MG tablet Commonly known as:  CATAPRES Take 0.2 mg by mouth 3 (three) times daily.   desmopressin 0.2 MG tablet Commonly known as:  DDAVP Take 1 tablet by mouth  at bedtime.   docusate sodium 100 MG capsule Commonly known as:  COLACE Take 100 mg by mouth at bedtime.   levETIRAcetam 1000 MG tablet Commonly known as:  KEPPRA Take 1,000 mg by mouth 3 (three) times daily.   metoprolol tartrate 25 MG tablet Commonly known as:  LOPRESSOR Take 25 mg by mouth 2 (two) times daily.   ONE-A-DAY VITACRAVES  ADULT Chew Chew 1 tablet by mouth daily.   potassium chloride SA 20 MEQ tablet Commonly known as:  K-DUR,KLOR-CON Take 1 tablet by mouth daily.      Follow-up Information    Fran Lowes, MD Follow up in 4 week(s).   Specialty:  Nephrology Contact information: [address] [place] [address] [phone]        [name], MD. Schedule an appointment as soon as possible for a visit in 2 week(s).   Specialty:  Internal Medicine Contact information: [place] [address] [phone]          Allergies  Allergen Reactions  . Benadryl [Diphenhydramine Hcl (Sleep)] Hives  . Quinine Derivatives Other (See Comments)    Alters mental status  . Azithromycin Itching and Rash  . Zosyn [Piperacillin Sod-Tazobactam So] Rash    Consultations:  Nephrology   Procedures/Studies: US Renal  Result Date: 04/27/2017 CLINICAL DATA:  Acute kidney injury.  UTI. EXAM: RENAL / URINARY TRACT ULTRASOUND COMPLETE COMPARISON:  06/24/2016 FINDINGS: Right Kidney: Length: 7.9 cm. Cortical thinning and scarring. Small exophytic cyst off the upper pole measures 10 mm. Slight pelviectasis without frank hydronephrosis. Left Kidney: Length: 11.4 cm. Mildly nodular contours. No mass or hydronephrosis. Bladder: Decompressed with Foley catheter in place. Small right pleural effusion noted. IMPRESSION: Atrophic right kidney with cortical thinning and scarring. Slight pelviectasis. No acute findings on the left. Electronically Signed   By: Rolm Baptise M.D.   On: 04/27/2017 10:32   Dg Chest Portable 1 View  Result Date: 04/25/2017 CLINICAL DATA:  Altered mental status, UTI, paraplegic EXAM: PORTABLE CHEST 1 VIEW COMPARISON:  07/01/2016 FINDINGS: Lungs are clear.  No pleural effusion or pneumothorax. The heart is top-normal in size. Thoracic spine fixation. IMPRESSION: No evidence of acute cardiopulmonary disease. Electronically Signed   By: Julian Hy  M.D.   On: 04/25/2017 13:04       Subjective: No nausea or vomiting.  No shortness of breath.  Discharge Exam: Vitals:   04/28/17 2100 04/29/17 0635  BP: (!) 184/86 135/66  Pulse: (!) 59 63  Resp: 18 16  Temp: 98.4 F (36.9 C) (!) 97.4 F (36.3 C)  SpO2: 99% 98%   Vitals:   04/28/17 1441 04/28/17 1959 04/28/17 2100 04/29/17 0635  BP: (!) 170/86  (!) 184/86 135/66  Pulse: 66  (!) 59 63  Resp: 18  18 16   Temp: 98.5 F (36.9 C)  98.4 F (36.9 C) (!) 97.4 F (36.3 C)  TempSrc: Oral  Oral Oral  SpO2: 100% 99% 99% 98%  Weight:      Height:        General: Pt is alert, awake, not in acute distress Cardiovascular: RRR, S1/S2 +, no rubs, no gallops Respiratory: CTA bilaterally, no wheezing, no rhonchi Abdominal: Soft, NT, ND, bowel sounds + Extremities: no edema, no cyanosis    The results of significant diagnostics from this hospitalization (including imaging, microbiology, ancillary and laboratory) are listed below for reference.     Microbiology: Recent Results (from the past 240 hour(s))  Urine culture  Status: Abnormal   Collection Time: 04/25/17 11:35 AM  Result Value Ref Range Status   Specimen Description URINE, CLEAN CATCH  Final   Special Requests NONE  Final   Culture MULTIPLE SPECIES PRESENT, SUGGEST RECOLLECTION (A)  Final   Report Status 04/27/2017 FINAL  Final  MRSA PCR Screening     Status: Abnormal   Collection Time: 04/25/17  6:00 PM  Result Value Ref Range Status   MRSA by PCR POSITIVE (A) NEGATIVE Final    Comment:        The GeneXpert MRSA Assay (FDA approved for NASAL specimens only), is one component of a comprehensive MRSA colonization surveillance program. It is not intended to diagnose MRSA infection nor to guide or monitor treatment for MRSA infections. RESULT CALLED TO, READ BACK BY AND VERIFIED WITH:  MARTIN,J @ 2029 ON 04/25/17 BY JUW      Labs: BNP (last 3 results) No results for input(s): BNP in the last 8760  hours. Basic Metabolic Panel: Recent Labs  Lab 04/25/17 1140 04/26/17 0907 04/27/17 0545 04/28/17 0538 04/29/17 0634  NA 140 135 135 136 135  K 4.5 3.4* 5.0 4.5 4.1  CL 120* 113* 112* 110 107  CO2 12* 17* 17* 21* 22  GLUCOSE 97 99 92 106* 109*  BUN 33* 35* 39* 47* 49*  CREATININE 4.45* 3.92* 3.70* 3.71* 3.72*  CALCIUM 8.1* 7.3* 6.7* 6.5* 6.7*  PHOS  --   --   --   --  5.0*   Liver Function Tests: Recent Labs  Lab 04/25/17 1140 04/29/17 0634  AST 16  --   ALT 14  --   ALKPHOS 107  --   BILITOT 0.3  --   PROT 6.4*  --   ALBUMIN 2.8* 1.9*   No results for input(s): LIPASE, AMYLASE in the last 168 hours. No results for input(s): AMMONIA in the last 168 hours. CBC: Recent Labs  Lab 04/25/17 1140 04/26/17 0907 04/28/17 0538 04/29/17 0636  WBC 7.6 7.6 7.0 6.9  NEUTROABS 5.3  --   --   --   HGB 7.3* 8.8* 7.7* 8.0*  HCT 23.2* 27.0* 24.2* 25.2*  MCV 95.1 90.3 90.0 91.0  PLT 244 203 178 186   Cardiac Enzymes: No results for input(s): CKTOTAL, CKMB, CKMBINDEX, TROPONINI in the last 168 hours. BNP: Invalid input(s): POCBNP CBG: No results for input(s): GLUCAP in the last 168 hours. D-Dimer No results for input(s): DDIMER in the last 72 hours. Hgb A1c No results for input(s): HGBA1C in the last 72 hours. Lipid Profile No results for input(s): CHOL, HDL, LDLCALC, TRIG, CHOLHDL, LDLDIRECT in the last 72 hours. Thyroid function studies No results for input(s): TSH, T4TOTAL, T3FREE, THYROIDAB in the last 72 hours.  Invalid input(s): FREET3 Anemia work up No results for input(s): VITAMINB12, FOLATE, FERRITIN, TIBC, IRON, RETICCTPCT in the last 72 hours. Urinalysis    Component Value Date/Time   COLORURINE YELLOW 04/25/2017 1005   APPEARANCEUR TURBID (A) 04/25/2017 1005   LABSPEC 1.011 04/25/2017 1005   PHURINE 5.0 04/25/2017 1005   GLUCOSEU >=500 (A) 04/25/2017 1005   HGBUR NEGATIVE 04/25/2017 1005   BILIRUBINUR NEGATIVE 04/25/2017 1005   KETONESUR NEGATIVE  04/25/2017 1005   PROTEINUR >=300 (A) 04/25/2017 1005   UROBILINOGEN 0.2 03/31/2015 1200   NITRITE POSITIVE (A) 04/25/2017 1005   LEUKOCYTESUR LARGE (A) 04/25/2017 1005   Sepsis Labs Invalid input(s): PROCALCITONIN,  WBC,  LACTICIDVEN Microbiology Recent Results (from the past 240 hour(s))  Urine culture  Status: Abnormal   Collection Time: 04/25/17 11:35 AM  Result Value Ref Range Status   Specimen Description URINE, CLEAN CATCH  Final   Special Requests NONE  Final   Culture MULTIPLE SPECIES PRESENT, SUGGEST RECOLLECTION (A)  Final   Report Status 04/27/2017 FINAL  Final  MRSA PCR Screening     Status: Abnormal   Collection Time: 04/25/17  6:00 PM  Result Value Ref Range Status   MRSA by PCR POSITIVE (A) NEGATIVE Final    Comment:        The GeneXpert MRSA Assay (FDA approved for NASAL specimens only), is one component of a comprehensive MRSA colonization surveillance program. It is not intended to diagnose MRSA infection nor to guide or monitor treatment for MRSA infections. RESULT CALLED TO, READ BACK BY AND VERIFIED WITH:  MARTIN,J @ 2029 ON 04/25/17 BY JUW      Time coordinating discharge: Over 30 minutes  SIGNED:   Kathie Dike, MD  [facility] 04/29/2017, 1:52 PM Pager   If 7PM-7AM, please contact night-coverage [email or web address] Password [email or web address]

## 2017-07-25 ENCOUNTER — Encounter (HOSPITAL_COMMUNITY)
Admission: RE | Admit: 2017-07-25 | Discharge: 2017-07-25 | Disposition: A | Discharge status: Home / Self Care | Payer: Medicaid Other | Source: Ambulatory Visit | Attending: Internal Medicine | Admitting: Internal Medicine

## 2017-07-25 DIAGNOSIS — D649 Anemia, unspecified: Secondary | ICD-10-CM | POA: Insufficient documentation

## 2017-07-25 LAB — PREPARE RBC (CROSSMATCH)

## 2017-07-25 LAB — HEMOGLOBIN AND HEMATOCRIT, BLOOD
HEMATOCRIT: 21.7 % — AB (ref 36.0–46.0)
Hemoglobin: 7 g/dL — ABNORMAL LOW (ref 12.0–15.0)

## 2017-07-25 MED ORDER — SODIUM CHLORIDE 0.9 % IV SOLN: Freq: Once | INTRAVENOUS | Status: DC

## 2017-07-25 MED ORDER — SODIUM CHLORIDE 0.9 % IV SOLN: INTRAVENOUS | Status: DC

## 2017-07-25 NOTE — Progress Notes (Signed)
Here for blood draw for type and crossmatch. Blood drawn sent to lab for results. Transfusing on Monday 07/28/17.

## 2017-07-25 NOTE — Progress Notes (Signed)
Results for [name], [name] (MRN [id number]) as of 07/25/2017 14:34  Ref. Range 07/25/2017 12:28  Hemoglobin Latest Ref Range: 12.0 - 15.0 g/dL 7.0 (L)  HCT Latest Ref Range: 36.0 - 46.0 % 21.7 (L)

## 2017-07-28 ENCOUNTER — Other Ambulatory Visit: Payer: Self-pay

## 2017-07-28 ENCOUNTER — Encounter (HOSPITAL_COMMUNITY): Payer: Self-pay | Admitting: Emergency Medicine

## 2017-07-28 ENCOUNTER — Emergency Department (HOSPITAL_COMMUNITY)
Admission: EM | Admit: 2017-07-28 | Discharge: 2017-07-28 | Disposition: A | Discharge status: Home / Self Care | Payer: Medicaid Other | Attending: Emergency Medicine | Admitting: Emergency Medicine

## 2017-07-28 ENCOUNTER — Encounter (HOSPITAL_COMMUNITY)
Admission: RE | Admit: 2017-07-28 | Discharge: 2017-07-28 | Disposition: A | Discharge status: Home / Self Care | Payer: Medicaid Other | Source: Ambulatory Visit | Attending: Internal Medicine | Admitting: Internal Medicine

## 2017-07-28 DIAGNOSIS — I129 Hypertensive chronic kidney disease with stage 1 through stage 4 chronic kidney disease, or unspecified chronic kidney disease: Secondary | ICD-10-CM | POA: Diagnosis present

## 2017-07-28 DIAGNOSIS — Z79899 Other long term (current) drug therapy: Secondary | ICD-10-CM | POA: Diagnosis not present

## 2017-07-28 DIAGNOSIS — I10 Essential (primary) hypertension: Secondary | ICD-10-CM

## 2017-07-28 DIAGNOSIS — D649 Anemia, unspecified: Secondary | ICD-10-CM | POA: Insufficient documentation

## 2017-07-28 DIAGNOSIS — N184 Chronic kidney disease, stage 4 (severe): Secondary | ICD-10-CM | POA: Diagnosis not present

## 2017-07-28 DIAGNOSIS — Z8542 Personal history of malignant neoplasm of other parts of uterus: Secondary | ICD-10-CM | POA: Insufficient documentation

## 2017-07-28 LAB — CBC WITH DIFFERENTIAL/PLATELET
BASOS ABS: 0.1 10*3/uL (ref 0.0–0.1)
BASOS PCT: 1 %
EOS ABS: 0.5 10*3/uL (ref 0.0–0.7)
EOS PCT: 7 %
HCT: 33.3 % — ABNORMAL LOW (ref 36.0–46.0)
Hemoglobin: 10.9 g/dL — ABNORMAL LOW (ref 12.0–15.0)
Lymphocytes Relative: 21 %
Lymphs Abs: 1.5 10*3/uL (ref 0.7–4.0)
MCH: 29.9 pg (ref 26.0–34.0)
MCHC: 32.7 g/dL (ref 30.0–36.0)
MCV: 91.2 fL (ref 78.0–100.0)
Monocytes Absolute: 0.4 10*3/uL (ref 0.1–1.0)
Monocytes Relative: 5 %
Neutro Abs: 4.5 10*3/uL (ref 1.7–7.7)
Neutrophils Relative %: 66 %
PLATELETS: 199 10*3/uL (ref 150–400)
RBC: 3.65 MIL/uL — ABNORMAL LOW (ref 3.87–5.11)
RDW: 15.8 % — ABNORMAL HIGH (ref 11.5–15.5)
WBC: 6.8 10*3/uL (ref 4.0–10.5)

## 2017-07-28 LAB — BASIC METABOLIC PANEL
Anion gap: 10 (ref 5–15)
BUN: 31 mg/dL — AB (ref 6–20)
CALCIUM: 7.7 mg/dL — AB (ref 8.9–10.3)
CO2: 15 mmol/L — ABNORMAL LOW (ref 22–32)
CREATININE: 3.55 mg/dL — AB (ref 0.44–1.00)
Chloride: 115 mmol/L — ABNORMAL HIGH (ref 101–111)
GFR calc Af Amer: 16 mL/min — ABNORMAL LOW (ref >60)
GFR, EST NON AFRICAN AMERICAN: 14 mL/min — AB (ref >60)
Glucose, Bld: 104 mg/dL — ABNORMAL HIGH (ref 65–99)
POTASSIUM: 4.1 mmol/L (ref 3.5–5.1)
SODIUM: 140 mmol/L (ref 135–145)

## 2017-07-28 MED ORDER — METOPROLOL TARTRATE 25 MG PO TABS
25.0000 mg | ORAL_TABLET | Freq: Once | ORAL | Status: AC
  Administered 2017-07-28: 25 mg via ORAL
  Filled 2017-07-28: qty 1

## 2017-07-28 MED ORDER — BACLOFEN 10 MG PO TABS
20.0000 mg | ORAL_TABLET | Freq: Three times a day (TID) | ORAL | Status: DC
  Administered 2017-07-28: 20 mg via ORAL
  Filled 2017-07-28: qty 2

## 2017-07-28 MED ORDER — CLONIDINE HCL 0.2 MG PO TABS
0.2000 mg | ORAL_TABLET | Freq: Once | ORAL | Status: AC
  Administered 2017-07-28: 0.2 mg via ORAL
  Filled 2017-07-28: qty 1

## 2017-07-28 MED ORDER — SODIUM CHLORIDE 0.9 % IV SOLN
INTRAVENOUS | Status: DC
  Administered 2017-07-28: 250 mL via INTRAVENOUS

## 2017-07-28 NOTE — Progress Notes (Signed)
Transferred to ED via stretcher with siderails up x2. Son at bedside. Report to Benay Pillow RN

## 2017-07-28 NOTE — ED Notes (Signed)
Pt alert and oriented.

## 2017-07-28 NOTE — Progress Notes (Signed)
Son lifted pt to stretcher. Pt incontinent of small amount of stool. Perineum cleansed. Green chux applied for diaper. Tolerated well.
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
## 2017-07-28 NOTE — Progress Notes (Signed)
Report called to [facility] in ED.

## 2017-07-28 NOTE — Progress Notes (Signed)
Here for blood transfusion. BP 187/87. Pt states did not take BP med today. Denies headache. Will continue to monitor.

## 2017-07-28 NOTE — ED Triage Notes (Addendum)
Pt received 2 units PRBC in short stay. Pt arrived with bp sys 180s has remained hypertensive after units. Dr. Legrand Rams called by RN at short stay. Pt a/o. Did not taken bp meds today

## 2017-07-28 NOTE — Progress Notes (Signed)
No show for 0700 appt for blood transfusion. Attempted to contact pt by phone with no answer. Attempted to contact so with no answer. Message left on pt's phone to see if coming for appt.

## 2017-07-28 NOTE — Progress Notes (Signed)
07/28/17 3496- daughter returned phone call. Stated did not know what time the appt was. Stated that her brother did not tell her the time. Will get mother ready and be on here soon. Informed daughter that pt may have to wait because of other scheduled appts. Voiced understanding.

## 2017-07-28 NOTE — Progress Notes (Signed)
Awake. Denies headache/pain. BP 211/94. Called Dr Josephine Cables office. He was not in. Talked with IDA, his nurse, and she stated that he would want the pt to be sent to ER for evaluation. She stated that she would notify him of situation. Informed pt that she needed to go to ER for evaluation of her hypertension and she and her son are in agreement.

## 2017-07-28 NOTE — Progress Notes (Signed)
Regular lunch tray given to pt. Tolerated well.

## 2017-07-28 NOTE — Discharge Instructions (Signed)
You were sent down to the emergency department for elevated blood pressure as you are getting your blood transfusion.  We gave you her regular meds and your blood pressure is improved here.  You were having no symptoms related to the high blood pressure.  You should schedule follow-up with your doctor and return if any problems.

## 2017-07-29 LAB — TYPE AND SCREEN
ABO/RH(D): A POS
ANTIBODY SCREEN: NEGATIVE
Unit division: 0
Unit division: 0

## 2017-07-29 LAB — BPAM RBC
BLOOD PRODUCT EXPIRATION DATE: 201903092359
Blood Product Expiration Date: 201903242359
ISSUE DATE / TIME: 201903041155
ISSUE DATE / TIME: 201903041023
UNIT TYPE AND RH: 6200
Unit Type and Rh: 6200

## 2017-11-20 ENCOUNTER — Other Ambulatory Visit (HOSPITAL_COMMUNITY)
Admission: RE | Admit: 2017-11-20 | Discharge: 2017-11-20 | Disposition: A | Discharge status: Home / Self Care | Payer: Medicaid Other | Source: Other Acute Inpatient Hospital | Attending: Internal Medicine | Admitting: Internal Medicine

## 2017-11-20 DIAGNOSIS — Z87440 Personal history of urinary (tract) infections: Secondary | ICD-10-CM | POA: Diagnosis present

## 2017-11-20 DIAGNOSIS — N184 Chronic kidney disease, stage 4 (severe): Secondary | ICD-10-CM | POA: Insufficient documentation

## 2017-11-20 LAB — PROTEIN / CREATININE RATIO, URINE
Creatinine, Urine: 39.41 mg/dL
Protein Creatinine Ratio: 6.14 mg/mg{Cre} — ABNORMAL HIGH (ref 0.00–0.15)
Total Protein, Urine: 242 mg/dL

## 2017-11-20 LAB — BASIC METABOLIC PANEL
Anion gap: 8 (ref 5–15)
BUN: 54 mg/dL — ABNORMAL HIGH (ref 6–20)
CALCIUM: 7.3 mg/dL — AB (ref 8.9–10.3)
CO2: 15 mmol/L — ABNORMAL LOW (ref 22–32)
CREATININE: 4.87 mg/dL — AB (ref 0.44–1.00)
Chloride: 115 mmol/L — ABNORMAL HIGH (ref 98–111)
GFR calc Af Amer: 11 mL/min — ABNORMAL LOW (ref >60)
GFR, EST NON AFRICAN AMERICAN: 10 mL/min — AB (ref >60)
GLUCOSE: 99 mg/dL (ref 70–99)
POTASSIUM: 4.6 mmol/L (ref 3.5–5.1)
SODIUM: 138 mmol/L (ref 135–145)

## 2017-11-21 LAB — URINALYSIS, ROUTINE W REFLEX MICROSCOPIC
Bilirubin Urine: NEGATIVE
GLUCOSE, UA: 500 mg/dL — AB
Ketones, ur: NEGATIVE mg/dL
Nitrite: POSITIVE — AB
PH: 6 (ref 5.0–8.0)
Protein, ur: 100 mg/dL — AB
SPECIFIC GRAVITY, URINE: 1.008 (ref 1.005–1.030)

## 2017-11-23 LAB — URINE CULTURE: Culture: >=100000 — AB

## 2018-01-13 DIAGNOSIS — Z992 Dependence on renal dialysis: Secondary | ICD-10-CM | POA: Insufficient documentation

## 2018-04-02 ENCOUNTER — Emergency Department (HOSPITAL_COMMUNITY): Payer: Medicare Other

## 2018-04-02 ENCOUNTER — Emergency Department (HOSPITAL_COMMUNITY)
Admission: EM | Admit: 2018-04-02 | Discharge: 2018-04-02 | Disposition: A | Discharge status: Home / Self Care | Payer: Medicare Other | Attending: Emergency Medicine | Admitting: Emergency Medicine

## 2018-04-02 DIAGNOSIS — Z79899 Other long term (current) drug therapy: Secondary | ICD-10-CM | POA: Insufficient documentation

## 2018-04-02 DIAGNOSIS — T83010A Breakdown (mechanical) of cystostomy catheter, initial encounter: Secondary | ICD-10-CM

## 2018-04-02 DIAGNOSIS — Z87820 Personal history of traumatic brain injury: Secondary | ICD-10-CM | POA: Diagnosis not present

## 2018-04-02 DIAGNOSIS — N184 Chronic kidney disease, stage 4 (severe): Secondary | ICD-10-CM | POA: Insufficient documentation

## 2018-04-02 DIAGNOSIS — Z8541 Personal history of malignant neoplasm of cervix uteri: Secondary | ICD-10-CM | POA: Diagnosis not present

## 2018-04-02 DIAGNOSIS — Y828 Other medical devices associated with adverse incidents: Secondary | ICD-10-CM | POA: Diagnosis not present

## 2018-04-02 DIAGNOSIS — R339 Retention of urine, unspecified: Secondary | ICD-10-CM | POA: Insufficient documentation

## 2018-04-02 HISTORY — PX: IR CATHETER TUBE CHANGE: IMG717

## 2018-04-02 MED ORDER — IOPAMIDOL (ISOVUE-300) INJECTION 61%
INTRAVENOUS | Status: AC
  Administered 2018-04-02: 10 mL
  Filled 2018-04-02: qty 50

## 2018-04-02 MED ORDER — IOPAMIDOL (ISOVUE-300) INJECTION 61%
50.0000 mL | Freq: Once | INTRAVENOUS | Status: AC | PRN
  Administered 2018-04-02: 10 mL

## 2018-04-02 MED ORDER — LIDOCAINE HCL 1 % IJ SOLN
INTRAMUSCULAR | Status: AC
  Filled 2018-04-02: qty 20

## 2018-04-02 NOTE — ED Provider Notes (Signed)
Patient returned from interventional radiology after her procedure.  She is hemodynamically stable.  She wants to go home.  Will discharge.   [name], MD 04/02/18 1807

## 2018-04-02 NOTE — ED Notes (Signed)
EMS in to transport

## 2018-04-02 NOTE — Procedures (Signed)
Interventional Radiology Procedure Note  Procedure: Suprapubic catheter change  Complications: None  Estimated Blood Loss: None  Findings: New 16 Fr balloon retention catheter placed over wire into bladder lumen and connected to gravity bag.  Venetia Night. Kathlene Cote, M.D Pager:  [phone]

## 2018-04-02 NOTE — ED Provider Notes (Signed)
[facility] EMERGENCY DEPARTMENT Provider Note   CSN: [id number] Arrival date & time: 04/02/18  1222     History   Chief Complaint Chief Complaint  Patient presents with  . Urinary Retention    patient has suprapubic catheter - could not get back in after taking out for change    HPI [name] is a 52 y.o. female.  HPI  Patient presents with concern of a dislodged suprapubic catheter. Patient has paraplegia. During routine change in the catheter patient's home health aide was unable to replace the catheter purulence occurred earlier this morning, about 4 hours ago. Since that time the patient has had dislodged catheter. Patient has no sensation in the area, has no physical complaints, no reported fever.   Past Medical History:  Diagnosis Date  . Abnormal uterine bleeding (AUB) 06/15/2014  . Cancer ([place])    uterine  . High blood pressure   . Paraplegia (lower)   . Seizure disorder ([place])   . Seizures ([place])   . Suprapubic catheter ([place])   . Urinary tract infection     Patient Active Problem List   Diagnosis Date Noted  . 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 chronic kidney disease ([place]) 04/25/2017  . AKI (acute kidney injury) ([place]) 04/25/2017  . Dehydration 04/25/2017  . CKD (chronic kidney disease) stage 4, GFR 15-29 ml/min (HCC) 04/25/2017  . Metabolic acidosis 62/83/1517  . Altered mental status 06/22/2016  . Altered mental state 08/16/2015  . Pressure ulcer 05/16/2015  . Acute encephalopathy 05/15/2015  . Malnutrition ([place])   . Encephalopathy acute 10/10/2014  . Sepsis ([place]) 10/10/2014  . HCAP (healthcare-associated pneumonia) 10/10/2014  . Encephalomalacia 10/10/2014  . TBI (traumatic brain injury) ([place]) 10/10/2014  . Severe protein-calorie malnutrition ([place]) 10/10/2014  . UTI (urinary tract infection) 08/26/2014  . Hypokalemia 08/26/2014  . Abnormal uterine bleeding (AUB) 06/15/2014  . High blood pressure   . Seizure disorder ([place])   . Status epilepticus  ([place]) 05/02/2014  . Hypertensive emergency without congestive heart failure 05/02/2014  . Acute renal failure ([place]) 05/21/2013  . Hyperkalemia 05/21/2013  . Hypothermia 05/21/2013  . Paraplegia ([place]) 06/25/2012  . Suprapubic catheter ([place]) 06/25/2012  . Decubitus ulcer of buttock 06/25/2012    Past Surgical History:  Procedure Laterality Date  . BACK SURGERY     Pt stated "before 2000"  . ESOPHAGOGASTRODUODENOSCOPY N/A 09/20/2015   Procedure: ESOPHAGOGASTRODUODENOSCOPY (EGD);  Surgeon: Rogene Houston, MD;  Location[facility];  Service: Endoscopy;  Laterality: N/A;  730  . PERCUTANEOUS ENDOSCOPIC GASTROSTOMY (PEG) REMOVAL N/A 09/20/2015   Procedure: PERCUTANEOUS ENDOSCOPIC GASTROSTOMY (PEG) REMOVAL;  Surgeon: Rogene Houston, MD;  Location: [facility]O [facility];  Service: Endoscopy;  Laterality: N/A;     OB History    Gravida  4   Para  3   Term      Preterm      AB  1   Living  3     SAB  1   TAB      Ectopic      Multiple      Live Births               Home Medications    Prior to Admission medications   Medication Sig Start Date End Date Taking? Authorizing Provider  baclofen (LIORESAL) 20 MG tablet Take 20 mg by mouth daily.    [provider]  cloNIDine (CATAPRES) 0.2 MG tablet Take 0.2 mg by mouth 3 (three) times daily.  [provider]  desmopressin (DDAVP) 0.2 MG tablet Take 1 tablet by mouth at bedtime. 02/02/16   [provider]  docusate sodium (COLACE) 100 MG capsule Take 100 mg by mouth at bedtime.    [provider]  ibuprofen (ADVIL,MOTRIN) 200 MG tablet Take 200 mg by mouth every 6 (six) hours as needed.    [provider]  levETIRAcetam (KEPPRA) 1000 MG tablet Take 1,000 mg by mouth 3 (three) times daily. 05/10/14   [provider]  metoprolol tartrate (LOPRESSOR) 25 MG tablet Take 25 mg by mouth 2 (two) times daily. 05/10/14   [provider]  Multiple Vitamins-Minerals (ONE-A-DAY  VITACRAVES ADULT) CHEW Chew 1 tablet by mouth daily.    [provider]  potassium chloride SA (K-DUR,KLOR-CON) 20 MEQ tablet Take 1 tablet by mouth daily.    [provider]    Family History Family History  Problem Relation Age of Onset  . Cancer Mother   . Hypertension Mother   . Cancer Sister        breast and then spread everywhere.  . Diabetes Paternal Grandmother   . Hypertension Paternal Grandmother     Social History Social History   Tobacco Use  . Smoking status: Never Smoker  . Smokeless tobacco: Never Used  Substance Use Topics  . Alcohol use: No    Alcohol/week: 0.0 standard drinks  . Drug use: No     Allergies   Benadryl [diphenhydramine hcl (sleep)]; Quinine derivatives; Azithromycin; and Zosyn [piperacillin sod-tazobactam so]   Review of Systems Review of Systems  Constitutional:       Per HPI, otherwise negative  HENT:       Per HPI, otherwise negative  Respiratory:       Per HPI, otherwise negative  Cardiovascular:       Per HPI, otherwise negative  Gastrointestinal: Negative for vomiting.  Endocrine:       Negative aside from HPI  Genitourinary:       Neg aside from HPI   Musculoskeletal:       Per HPI, otherwise negative  Skin: Negative.   Neurological: Positive for weakness. Negative for syncope.       Paraplegia     Physical Exam Updated Vital Signs BP (!) 162/78 (BP Location: Right Arm)   Pulse (!) 56   Temp (!) 97.5 F (36.4 C) (Oral)   Resp 18   Ht 5\' 2"  (1.575 m)   Wt 61.2 kg   SpO2 99%   BMI 24.69 kg/m   Physical Exam  Constitutional: She is oriented to person, place, and time. She appears well-developed and well-nourished. No distress.  HENT:  Head: Normocephalic and atraumatic.  Eyes: Conjunctivae and EOM are normal.  Cardiovascular: Normal rate and regular rhythm.  Pulmonary/Chest: Effort normal and breath sounds normal. No stridor. No respiratory distress.  Abdominal: She exhibits no  distension.    Genitourinary:  Genitourinary Comments: Suprapubic catheter dislodged, minimally through the cutaneous layer  Musculoskeletal: She exhibits no edema.  Neurological: She is alert and oriented to person, place, and time. No cranial nerve deficit.  Paraplegic  Skin: Skin is warm and dry.  Psychiatric: She has a normal mood and affect.  Nursing note and vitals reviewed.    ED Treatments / Results   Procedures Procedures (including critical care time)  Medications Ordered in ED Medications - No data to display   Initial Impression / Assessment and Plan / ED Course  I have reviewed the triage  vital signs and the nursing notes.  Pertinent labs & imaging results that were available during my care of the patient were reviewed by me and considered in my medical decision making (see chart for details).  After I was unsuccessful in replacing the patient's suprapubic catheter I discussed her case with our urologist on-call, and subsequently with our interventional radiologist on call. Patient will require transfer to our affiliated facility for interventional radiology replacement of her suprapubic catheter. Patient is aware of this necessity, denies other complaints, is hemodynamically unremarkable, preparing for transfer.   Final Clinical Impressions(s) / ED Diagnoses  Suprapubic catheter dysfunction, initial encounter   Carmin Muskrat, MD 04/02/18 1338

## 2018-04-02 NOTE — ED Notes (Signed)
Pt transported to IR 

## 2018-04-02 NOTE — ED Notes (Signed)
IR aware of patient

## 2018-04-02 NOTE — Discharge Instructions (Addendum)
Follow-up with [facility] for further problems.

## 2018-04-02 NOTE — ED Notes (Signed)
Pt arrives from [facility]. Pt is a paraplegic and has a suprapubic cath. Home health nurse came out today to replace the cath and was unable to get the suprapubic cath back in place. Also concerns for blockage. Pt to have cath replaced by Urology in IR

## 2018-04-02 NOTE — ED Notes (Signed)
[facility] called and en route 

## 2018-04-02 NOTE — ED Notes (Signed)
Report given to Daleen Snook. EMS leaving with pt

## 2018-04-02 NOTE — ED Triage Notes (Signed)
Patient is a paraplegic and has a chronic suprapubic catheter. The patients home health nurse removed her catheter today because it was due to be changed but could not get it back in all the way. She inserted it partially but there is some bloody drainage noted to the exit site. She also states that a few days ago that she had urine leaking around it a few days ago as well which is not typical. Denies back, abdominal pain, or nausea.

## 2018-04-02 NOTE — ED Notes (Signed)
Bed: [facility] Expected date:  Expected time:  Means of arrival:  Comments: PT IN IR

## 2018-04-02 NOTE — ED Notes (Signed)
IR informed of patient's arrival

## 2018-04-03 ENCOUNTER — Encounter (HOSPITAL_COMMUNITY): Payer: Self-pay | Admitting: Interventional Radiology

## 2018-04-17 ENCOUNTER — Emergency Department (HOSPITAL_COMMUNITY): Payer: Medicare Other

## 2018-04-17 ENCOUNTER — Other Ambulatory Visit: Payer: Self-pay

## 2018-04-17 ENCOUNTER — Emergency Department (HOSPITAL_COMMUNITY)
Admission: EM | Admit: 2018-04-17 | Discharge: 2018-04-18 | Disposition: A | Discharge status: Home / Self Care | Payer: Medicare Other | Attending: Emergency Medicine | Admitting: Emergency Medicine

## 2018-04-17 ENCOUNTER — Encounter (HOSPITAL_COMMUNITY): Payer: Self-pay | Admitting: Emergency Medicine

## 2018-04-17 DIAGNOSIS — T8249XA Other complication of vascular dialysis catheter, initial encounter: Secondary | ICD-10-CM | POA: Diagnosis not present

## 2018-04-17 DIAGNOSIS — Y69 Unspecified misadventure during surgical and medical care: Secondary | ICD-10-CM | POA: Insufficient documentation

## 2018-04-17 DIAGNOSIS — T829XXA Unspecified complication of cardiac and vascular prosthetic device, implant and graft, initial encounter: Secondary | ICD-10-CM

## 2018-04-17 NOTE — ED Provider Notes (Signed)
[facility] DEPARTMENT Provider Note   CSN: [id number] Arrival date & time: 04/17/18  1851     History   Chief Complaint Chief Complaint  Patient presents with  . Vascular Access Problem    HPI [name] is a 52 y.o. female.  52 yo F with a chief complaint of dislodging her dialysis access catheter.  She said she was taking her shirt off and she realized that there was something in her clothing and realized that that is what it was.  She does not think that anything broke off from it she denies chest pain denies trouble breathing.  The bleeding has stopped.  She denies blood thinner use.  The history is provided by the patient.  Illness  This is a new problem. The current episode started 2 days ago. The problem occurs constantly. The problem has not changed since onset.Pertinent negatives include no chest pain, no headaches and no shortness of breath. Nothing aggravates the symptoms. Nothing relieves the symptoms. She has tried nothing for the symptoms. The treatment provided no relief.    Past Medical History:  Diagnosis Date  . Abnormal uterine bleeding (AUB) 06/15/2014  . Cancer ([place])    uterine  . High blood pressure   . Paraplegia (lower)   . Seizure disorder ([place])   . Seizures ([place])   . Suprapubic catheter ([place])   . Urinary tract infection     Patient Active Problem List   Diagnosis Date Noted  . 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 chronic kidney disease ([place]) 04/25/2017  . AKI (acute kidney injury) ([place]) 04/25/2017  . Dehydration 04/25/2017  . CKD (chronic kidney disease) stage 4, GFR 15-29 ml/min (HCC) 04/25/2017  . Metabolic acidosis 41/96/2229  . Altered mental status 06/22/2016  . Altered mental state 08/16/2015  . Pressure ulcer 05/16/2015  . Acute encephalopathy 05/15/2015  . Malnutrition ([place])   . Encephalopathy acute 10/10/2014  . Sepsis ([place]) 10/10/2014  . HCAP (healthcare-associated pneumonia) 10/10/2014  . Encephalomalacia  10/10/2014  . TBI (traumatic brain injury) ([place]) 10/10/2014  . Severe protein-calorie malnutrition ([place]) 10/10/2014  . UTI (urinary tract infection) 08/26/2014  . Hypokalemia 08/26/2014  . Abnormal uterine bleeding (AUB) 06/15/2014  . High blood pressure   . Seizure disorder ([place])   . Status epilepticus ([place]) 05/02/2014  . Hypertensive emergency without congestive heart failure 05/02/2014  . Acute renal failure ([place]) 05/21/2013  . Hyperkalemia 05/21/2013  . Hypothermia 05/21/2013  . Paraplegia ([place]) 06/25/2012  . Suprapubic catheter ([place]) 06/25/2012  . Decubitus ulcer of buttock 06/25/2012    Past Surgical History:  Procedure Laterality Date  . BACK SURGERY     Pt stated "before 2000"  . ESOPHAGOGASTRODUODENOSCOPY N/A 09/20/2015   Procedure: ESOPHAGOGASTRODUODENOSCOPY (EGD);  Surgeon: Rogene Houston, MD;  Location[facility];  Service: Endoscopy;  Laterality: N/A;  730  . IR CATHETER TUBE CHANGE  04/02/2018  . PERCUTANEOUS ENDOSCOPIC GASTROSTOMY (PEG) REMOVAL N/A 09/20/2015   Procedure: PERCUTANEOUS ENDOSCOPIC GASTROSTOMY (PEG) REMOVAL;  Surgeon: Rogene Houston, MD;  Location: [facility];  Service: Endoscopy;  Laterality: N/A;     OB History    Gravida  4   Para  3   Term      Preterm      AB  1   Living  3     SAB  1   TAB      Ectopic      Multiple      Live Births  Home Medications    Prior to Admission medications   Medication Sig Start Date End Date Taking? Authorizing Provider  baclofen (LIORESAL) 20 MG tablet Take 20 mg by mouth daily.    [provider]  cloNIDine (CATAPRES) 0.2 MG tablet Take 0.2 mg by mouth 3 (three) times daily.    [provider]  desmopressin (DDAVP) 0.2 MG tablet Take 1 tablet by mouth at bedtime. 02/02/16   [provider]  docusate sodium (COLACE) 100 MG capsule Take 100 mg by mouth at bedtime.    [provider]  ibuprofen (ADVIL,MOTRIN) 200 MG tablet Take 200 mg  by mouth every 6 (six) hours as needed.    [provider]  levETIRAcetam (KEPPRA) 1000 MG tablet Take 1,000 mg by mouth 3 (three) times daily. 05/10/14   [provider]  metoprolol tartrate (LOPRESSOR) 25 MG tablet Take 25 mg by mouth 2 (two) times daily. 05/10/14   [provider]  Multiple Vitamins-Minerals (ONE-A-DAY VITACRAVES ADULT) CHEW Chew 1 tablet by mouth daily.    [provider]  potassium chloride SA (K-DUR,KLOR-CON) 20 MEQ tablet Take 1 tablet by mouth daily.    [provider]    Family History Family History  Problem Relation Age of Onset  . Cancer Mother   . Hypertension Mother   . Cancer Sister        breast and then spread everywhere.  . Diabetes Paternal Grandmother   . Hypertension Paternal Grandmother     Social History Social History   Tobacco Use  . Smoking status: Never Smoker  . Smokeless tobacco: Never Used  Substance Use Topics  . Alcohol use: No    Alcohol/week: 0.0 standard drinks  . Drug use: No     Allergies   Benadryl [diphenhydramine hcl (sleep)]; Quinine derivatives; Azithromycin; and Zosyn [piperacillin sod-tazobactam so]   Review of Systems Review of Systems  Constitutional: Negative for chills and fever.  HENT: Negative for congestion and rhinorrhea.   Eyes: Negative for redness and visual disturbance.  Respiratory: Negative for shortness of breath and wheezing.   Cardiovascular: Negative for chest pain and palpitations.  Gastrointestinal: Negative for nausea and vomiting.  Genitourinary: Negative for dysuria and urgency.  Musculoskeletal: Negative for arthralgias and myalgias.  Skin: Negative for pallor and wound.  Neurological: Negative for dizziness and headaches.     Physical Exam Updated Vital Signs BP (!) 157/73 (BP Location: Right Arm)   Pulse 76   Resp 16   SpO2 97%   Physical Exam  Constitutional: She is oriented to person, place, and time. She appears well-developed  and well-nourished. No distress.  HENT:  Head: Normocephalic and atraumatic.  Eyes: Pupils are equal, round, and reactive to light. EOM are normal.  Neck: Normal range of motion. Neck supple.  Cardiovascular: Normal rate and regular rhythm. Exam reveals no gallop and no friction rub.  No murmur heard. Pulmonary/Chest: Effort normal. She has no wheezes. She has no rales.  Small puncture wound to the right anterior chest wall.  No bleeding no drainage.  Abdominal: Soft. She exhibits no distension. There is no tenderness.  Musculoskeletal: She exhibits no edema or tenderness.  Neurological: She is alert and oriented to person, place, and time.  Skin: Skin is warm and dry. She is not diaphoretic.  Psychiatric: She has a normal mood and affect. Her behavior is normal.  Nursing note and vitals reviewed.    ED Treatments / Results  Labs (all labs ordered are listed,  but only abnormal results are displayed) Labs Reviewed - No data to display  EKG None  Radiology Dg Chest 2 View  Result Date: 04/17/2018 CLINICAL DATA:  Right-sided dialysis catheter pulled out while changing sweat are tonight. Bleeding controlled. EXAM: CHEST - 2 VIEW COMPARISON:  04/25/2017 FINDINGS: Bilateral Harrington rod fixation of the thoracic spine without hardware failure. Borderline cardiomegaly. Nonaneurysmal thoracic aorta. No pulmonary consolidation, effusion or pneumothorax. Probable small epicardial fat pad and atelectasis medially at the right base. IMPRESSION: No active cardiopulmonary disease. Electronically Signed   By: Ashley Royalty M.D.   On: 04/17/2018 19:49    Procedures Procedures (including critical care time)  Medications Ordered in ED Medications - No data to display   Initial Impression / Assessment and Plan / ED Course  I have reviewed the triage vital signs and the nursing notes.  Pertinent labs & imaging results that were available during my care of the patient were reviewed by me and  considered in my medical decision making (see chart for details).     [place] with a cc of removal of her vascular access catheter.  I discussed the case with the IR doc on call, Dr. Annamaria Boots, who recommended calling the office on Monday morning to schedule the replacement that day.  Catheter reviewed by me without any obvious fracture parts will obtain a plain film.  CXR viewed be me without FB or other concerning finding.  I discussed with the patient the current plan to have her call the IR office on Monday morning.  The patient is concerned because she has no way to get there, currently takes the bus to bed at dialysis.  We will have social work evaluate for the transportation issues.  Patient may need to return to the emergency department for evaluation.  Social work was able to get the patient options for transportation and also recommended her own dialysis unit as a possible resource.  8:24 PM:  I have discussed the diagnosis/risks/treatment options with the patient and believe the pt to be eligible for discharge home to follow-up with PCP. We also discussed returning to the ED immediately if new or worsening sx occur. We discussed the sx which are most concerning (e.g., sudden worsening pain, fever, inability to tolerate by mouth) that necessitate immediate return. Medications administered to the patient during their visit and any new prescriptions provided to the patient are listed below.  Medications given during this visit Medications - No data to display    The patient appears reasonably screen and/or stabilized for discharge and I doubt any other medical condition or other [facility] requiring further screening, evaluation, or treatment in the ED at this time prior to discharge.    Final Clinical Impressions(s) / ED Diagnoses   Final diagnoses:  Complication of vascular access for dialysis, initial encounter    ED Discharge Orders    None       Deno Etienne, DO 04/17/18 2024

## 2018-04-17 NOTE — ED Triage Notes (Signed)
Pt BIB [facility] from home, reports that her dialysis catheter came out while changing her sweater tonight. Bleeding controlled at this time. Catheter placed to right chest in June 2019. Pt reports having dialysis today, no other complaints.

## 2018-04-17 NOTE — Discharge Instructions (Signed)
Return for shortness of breath or if you feel bad.  Call the number for dialysis.  You were given instructions to try and get a ride as well.  If all else fails you can return to the ED and they can try and help you set up your procedure on Monday.   [phone] Number for [facility] for Transportation assistance

## 2018-04-17 NOTE — Progress Notes (Signed)
CSW reviewed transportation issues with pt. Pt utilizes Medicaid transport to get to Dialysis. Pt does not have anyone at home that can transport her. CSW reviewed with pt calling her dialysis center tomorrow seeing if they can assist with transportation on Monday. Pt is scheduled to receive dialysis Monday and has her [employer] (Medicaid) transport scheduled. CSW provided information for [facility]  [phone].   Wendelyn Breslow, [facility]  [phone]

## 2018-04-20 ENCOUNTER — Other Ambulatory Visit (HOSPITAL_COMMUNITY): Payer: Self-pay | Admitting: Medical

## 2018-04-20 ENCOUNTER — Other Ambulatory Visit (HOSPITAL_COMMUNITY): Payer: Self-pay | Admitting: Interventional Radiology

## 2018-04-20 ENCOUNTER — Other Ambulatory Visit: Payer: Self-pay | Admitting: Physician Assistant

## 2018-04-20 DIAGNOSIS — T8241XA Breakdown (mechanical) of vascular dialysis catheter, initial encounter: Secondary | ICD-10-CM

## 2018-04-21 ENCOUNTER — Other Ambulatory Visit: Payer: Self-pay | Admitting: Radiology

## 2018-04-21 ENCOUNTER — Inpatient Hospital Stay (HOSPITAL_COMMUNITY): Admission: RE | Admit: 2018-04-21 | Payer: Medicare Other | Source: Ambulatory Visit

## 2018-05-07 ENCOUNTER — Encounter (HOSPITAL_COMMUNITY): Payer: Self-pay | Admitting: *Deleted

## 2018-05-07 ENCOUNTER — Emergency Department (HOSPITAL_COMMUNITY)
Admission: EM | Admit: 2018-05-07 | Discharge: 2018-05-07 | Disposition: A | Discharge status: Home / Self Care | Payer: Medicare Other | Attending: Emergency Medicine | Admitting: Emergency Medicine

## 2018-05-07 ENCOUNTER — Other Ambulatory Visit: Payer: Self-pay

## 2018-05-07 DIAGNOSIS — Y658 Other specified misadventures during surgical and medical care: Secondary | ICD-10-CM | POA: Insufficient documentation

## 2018-05-07 DIAGNOSIS — I129 Hypertensive chronic kidney disease with stage 1 through stage 4 chronic kidney disease, or unspecified chronic kidney disease: Secondary | ICD-10-CM | POA: Insufficient documentation

## 2018-05-07 DIAGNOSIS — N184 Chronic kidney disease, stage 4 (severe): Secondary | ICD-10-CM | POA: Insufficient documentation

## 2018-05-07 DIAGNOSIS — T83198A Other mechanical complication of other urinary devices and implants, initial encounter: Secondary | ICD-10-CM | POA: Insufficient documentation

## 2018-05-07 DIAGNOSIS — T83010A Breakdown (mechanical) of cystostomy catheter, initial encounter: Secondary | ICD-10-CM

## 2018-05-07 NOTE — ED Notes (Signed)
EDP at bedside to attempt to replace suprapubic catheter.

## 2018-05-07 NOTE — ED Notes (Signed)
Brief changed ?

## 2018-05-07 NOTE — ED Notes (Signed)
Pt given meal tray.

## 2018-05-07 NOTE — ED Notes (Signed)
EMS arrived to transport pt home.  
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
## 2018-05-07 NOTE — Discharge Instructions (Addendum)
You were seen in the ED today with a suprapubic catheter that has fallen out. We were able to get the catheter back in place. Continue your routine foley catheter care. Return to the ED with any new or worsening symptoms.

## 2018-05-07 NOTE — ED Notes (Signed)
Called EMS for transport back home 

## 2018-05-07 NOTE — ED Provider Notes (Signed)
Emergency Department Provider Note   I have reviewed the triage vital signs and the nursing notes.   HISTORY  Chief Complaint Abdominal Pain   HPI [name] is a 52 y.o. female with PMH of HTN, paraplegia, and suprapubic catheter presents to the emergency department for evaluation of catheter complication.  Patient is managed by a home health nurse who visited her at home this morning.  The patient's old suprapubic catheter was removed but the home health nurse was unable to reinsert a new catheter.  Patient was transported to the emergency department.  Patient states this is a long standing track.  She does not experience any pain symptoms due to her paraplegia.  She has been leaking urine from the stoma. There has not been a prolonged period of time with the foely catheter out.   Past Medical History:  Diagnosis Date  . Abnormal uterine bleeding (AUB) 06/15/2014  . Cancer ([place])    uterine  . High blood pressure   . Paraplegia (lower)   . Seizure disorder ([place])   . Seizures ([place])   . Suprapubic catheter ([place])   . Urinary tract infection     Patient Active Problem List   Diagnosis Date Noted  . 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 chronic kidney disease ([place]) 04/25/2017  . AKI (acute kidney injury) ([place]) 04/25/2017  . Dehydration 04/25/2017  . CKD (chronic kidney disease) stage 4, GFR 15-29 ml/min (HCC) 04/25/2017  . Metabolic acidosis 37/34/2876  . Altered mental status 06/22/2016  . Altered mental state 08/16/2015  . Pressure ulcer 05/16/2015  . Acute encephalopathy 05/15/2015  . Malnutrition ([place])   . Encephalopathy acute 10/10/2014  . Sepsis ([place]) 10/10/2014  . HCAP (healthcare-associated pneumonia) 10/10/2014  . Encephalomalacia 10/10/2014  . TBI (traumatic brain injury) ([place]) 10/10/2014  . Severe protein-calorie malnutrition ([place]) 10/10/2014  . UTI (urinary tract infection) 08/26/2014  . Hypokalemia 08/26/2014  . Abnormal uterine bleeding (AUB) 06/15/2014  . High  blood pressure   . Seizure disorder ([place])   . Status epilepticus ([place]) 05/02/2014  . Hypertensive emergency without congestive heart failure 05/02/2014  . Acute renal failure ([place]) 05/21/2013  . Hyperkalemia 05/21/2013  . Hypothermia 05/21/2013  . Paraplegia ([place]) 06/25/2012  . Suprapubic catheter ([place]) 06/25/2012  . Decubitus ulcer of buttock 06/25/2012    Past Surgical History:  Procedure Laterality Date  . BACK SURGERY     Pt stated "before 2000"  . ESOPHAGOGASTRODUODENOSCOPY N/A 09/20/2015   Procedure: ESOPHAGOGASTRODUODENOSCOPY (EGD);  Surgeon: Rogene Houston, MD;  Location[facility];  Service: Endoscopy;  Laterality: N/A;  730  . IR CATHETER TUBE CHANGE  04/02/2018  . PERCUTANEOUS ENDOSCOPIC GASTROSTOMY (PEG) REMOVAL N/A 09/20/2015   Procedure: PERCUTANEOUS ENDOSCOPIC GASTROSTOMY (PEG) REMOVAL;  Surgeon: Rogene Houston, MD;  Location[facility];  Service: Endoscopy;  Laterality: N/A;    Allergies Benadryl [diphenhydramine hcl (sleep)]; Quinine derivatives; Azithromycin; Tetracycline; and Zosyn [piperacillin sod-tazobactam so]  Family History  Problem Relation Age of Onset  . Cancer Mother   . Hypertension Mother   . Cancer Sister        breast and then spread everywhere.  . Diabetes Paternal Grandmother   . Hypertension Paternal Grandmother     Social History Social History   Tobacco Use  . Smoking status: Never Smoker  . Smokeless tobacco: Never Used  Substance Use Topics  . Alcohol use: No    Alcohol/week: 0.0 standard drinks  . Drug use: No    Review of  Systems  Constitutional: No fever/chills Genitourinary: Suprapubic foley complication.  Neurological: Negative for headaches.  10-point ROS otherwise negative.  ____________________________________________   PHYSICAL EXAM:  VITAL SIGNS: ED Triage Vitals  Enc Vitals Group     BP 05/07/18 1252 133/77     Pulse Rate 05/07/18 1252 63     Resp 05/07/18 1252 16     Temp 05/07/18 1252 98.1  F (36.7 C)     Temp Source 05/07/18 1252 Oral     SpO2 05/07/18 1252 100 %     Weight 05/07/18 1254 130 lb (59 kg)     Height 05/07/18 1254 5' (1.524 m)     Pain Score 05/07/18 1251 0   Constitutional: Alert and oriented. Well appearing and in no acute distress. Eyes: Conjunctivae are normal.  Head: Atraumatic. Nose: No congestion/rhinnorhea. Mouth/Throat: Mucous membranes are moist.   Neck: No stridor. Cardiovascular: Normal rate, regular rhythm. Good peripheral circulation. Grossly normal heart sounds.   Respiratory: Normal respiratory effort.  No retractions. Lungs CTAB. Gastrointestinal: No distention.  Musculoskeletal: Contacted lower extremities (baseline).  Neurologic:  Normal speech and language. Skin:  Skin is warm, dry and intact. Suprapubic cath stoma is well-appearing.   ____________________________________________  RADIOLOGY  None ____________________________________________   PROCEDURES  Procedure(s) performed:   BLADDER CATHETERIZATION Date/Time: 05/07/2018 1:18 PM Performed by: Margette Fast, MD Authorized by: Margette Fast, MD   Consent:    Consent obtained:  Verbal   Consent given by:  Patient   Risks discussed:  False passage, incomplete procedure, pain, infection and urethral injury   Alternatives discussed:  No treatment Pre-procedure details:    Procedure purpose:  Therapeutic   Preparation: Patient was prepped and draped in usual sterile fashion   Anesthesia (see MAR for exact dosages):    Anesthesia method:  None Procedure details:    Provider performed due to:  Complicated insertion   Catheter insertion: Suprapubic.   Catheter type:  Foley   Catheter size:  16 Fr (long)   Bladder irrigation: yes     Number of attempts:  1   Urine characteristics:  Blood-tinged Post-procedure details:    Patient tolerance of procedure:  Tolerated well, no immediate complications     ____________________________________________   INITIAL  IMPRESSION / ASSESSMENT AND PLAN / ED COURSE  Pertinent labs & imaging results that were available during my care of the patient were reviewed by me and considered in my medical decision making (see chart for details).  Patient with history of paraplegia presents with inability to replace her suprapubic catheter after exchange this morning.  The stoma is well-appearing without cellulitis, discharge.  Abdomen is non-distended. Catheter re-inserted at the bedside by myself without complication. Urine flash on insertion of catheter with minimal resistance at the skin surface but then passing easily.   Patient observed in the ED. Urine flowing freely into the bag. No leaking or bleeding from around the foley site. Plan for discharge.  ____________________________________________  FINAL CLINICAL IMPRESSION(S) / ED DIAGNOSES  Final diagnoses:  Suprapubic catheter dysfunction, initial encounter [facility])    Note:  This document was prepared using Dragon voice recognition software and may include unintentional dictation errors.  Nanda Quinton, MD Emergency Medicine    Long, [facility], MD 05/07/18 [phone]

## 2018-05-07 NOTE — ED Notes (Signed)
Pt given dinner meal tray

## 2018-05-07 NOTE — ED Notes (Signed)
Dressed catheter with 4x4 and medipore tape.

## 2018-05-07 NOTE — ED Notes (Signed)
Secretary to notify [facility] for EMS transport home.

## 2018-05-07 NOTE — ED Notes (Signed)
Unable to pull back urine with syringe at this time. EDP notified.

## 2018-12-04 ENCOUNTER — Encounter (HOSPITAL_COMMUNITY): Payer: Self-pay

## 2018-12-04 ENCOUNTER — Emergency Department (HOSPITAL_COMMUNITY)
Admission: EM | Admit: 2018-12-04 | Discharge: 2018-12-04 | Disposition: A | Discharge status: Home / Self Care | Payer: Medicare Other | Attending: Emergency Medicine | Admitting: Emergency Medicine

## 2018-12-04 ENCOUNTER — Other Ambulatory Visit: Payer: Self-pay

## 2018-12-04 DIAGNOSIS — I129 Hypertensive chronic kidney disease with stage 1 through stage 4 chronic kidney disease, or unspecified chronic kidney disease: Secondary | ICD-10-CM | POA: Insufficient documentation

## 2018-12-04 DIAGNOSIS — N3001 Acute cystitis with hematuria: Secondary | ICD-10-CM | POA: Diagnosis not present

## 2018-12-04 DIAGNOSIS — Z79899 Other long term (current) drug therapy: Secondary | ICD-10-CM | POA: Diagnosis not present

## 2018-12-04 DIAGNOSIS — N184 Chronic kidney disease, stage 4 (severe): Secondary | ICD-10-CM | POA: Diagnosis not present

## 2018-12-04 DIAGNOSIS — R319 Hematuria, unspecified: Secondary | ICD-10-CM | POA: Diagnosis present

## 2018-12-04 DIAGNOSIS — N390 Urinary tract infection, site not specified: Secondary | ICD-10-CM

## 2018-12-04 LAB — URINALYSIS, ROUTINE W REFLEX MICROSCOPIC
Bilirubin Urine: NEGATIVE
Glucose, UA: 150 mg/dL — AB
Ketones, ur: NEGATIVE mg/dL
Nitrite: NEGATIVE
Protein, ur: 30 mg/dL — AB
Specific Gravity, Urine: 1.001 — ABNORMAL LOW (ref 1.005–1.030)
pH: 8 (ref 5.0–8.0)

## 2018-12-04 MED ORDER — CEPHALEXIN 500 MG PO CAPS: 500.0000 mg | ORAL_CAPSULE | Freq: Two times a day (BID) | ORAL | 0 refills | Status: AC

## 2018-12-04 NOTE — Discharge Instructions (Signed)
See your Urologist as scheduled for recheck

## 2018-12-04 NOTE — ED Triage Notes (Signed)
Pt brought in by EMS from dialysis . Pt completed dialysis and it was noted that she had blood in catheter bag . Pt is paraplegic

## 2018-12-04 NOTE — ED Provider Notes (Signed)
[facility] EMERGENCY DEPARTMENT Provider Note   CSN: [id number] Arrival date & time: 12/04/18  1534     History   Chief Complaint Chief Complaint  Patient presents with  . Hematuria    HPI [name] is a 53 y.o. female.     The history is provided by the patient. No language interpreter was used.  Hematuria This is a new problem. The current episode started 12 to 24 hours ago. The problem occurs constantly. The problem has been gradually worsening. Nothing aggravates the symptoms. Nothing relieves the symptoms. She has tried nothing for the symptoms. The treatment provided no relief.  Pt complains of blood in urine.  Pt as a suprapubic catheter.  Pt had dialysis today and was advised to come to Ed for evaluation Pt reports she recently had the same and was treated with antibiotics  Past Medical History:  Diagnosis Date  . Abnormal uterine bleeding (AUB) 06/15/2014  . Cancer ([place])    uterine  . High blood pressure   . Paraplegia (lower)   . Seizure disorder ([place])   . Seizures ([place])   . Suprapubic catheter ([place])   . Urinary tract infection     Patient Active Problem List   Diagnosis Date Noted  . 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 chronic kidney disease ([place]) 04/25/2017  . AKI (acute kidney injury) ([place]) 04/25/2017  . Dehydration 04/25/2017  . CKD (chronic kidney disease) stage 4, GFR 15-29 ml/min (HCC) 04/25/2017  . Metabolic acidosis 45/62/5638  . Altered mental status 06/22/2016  . Altered mental state 08/16/2015  . Pressure ulcer 05/16/2015  . Acute encephalopathy 05/15/2015  . Malnutrition ([place])   . Encephalopathy acute 10/10/2014  . Sepsis ([place]) 10/10/2014  . HCAP (healthcare-associated pneumonia) 10/10/2014  . Encephalomalacia 10/10/2014  . TBI (traumatic brain injury) ([place]) 10/10/2014  . Severe protein-calorie malnutrition ([place]) 10/10/2014  . UTI (urinary tract infection) 08/26/2014  . Hypokalemia 08/26/2014  . Abnormal uterine bleeding (AUB) 06/15/2014  .  High blood pressure   . Seizure disorder ([place])   . Status epilepticus ([place]) 05/02/2014  . Hypertensive emergency without congestive heart failure 05/02/2014  . Acute renal failure ([place]) 05/21/2013  . Hyperkalemia 05/21/2013  . Hypothermia 05/21/2013  . Paraplegia ([place]) 06/25/2012  . Suprapubic catheter ([place]) 06/25/2012  . Decubitus ulcer of buttock 06/25/2012    Past Surgical History:  Procedure Laterality Date  . BACK SURGERY     Pt stated "before 2000"  . ESOPHAGOGASTRODUODENOSCOPY N/A 09/20/2015   Procedure: ESOPHAGOGASTRODUODENOSCOPY (EGD);  Surgeon: Rogene Houston, MD;  Location[facility];  Service: Endoscopy;  Laterality: N/A;  730  . IR CATHETER TUBE CHANGE  04/02/2018  . PERCUTANEOUS ENDOSCOPIC GASTROSTOMY (PEG) REMOVAL N/A 09/20/2015   Procedure: PERCUTANEOUS ENDOSCOPIC GASTROSTOMY (PEG) REMOVAL;  Surgeon: Rogene Houston, MD;  Location: [facility] ENDO SUITE;  Service: Endoscopy;  Laterality: N/A;     OB History    Gravida  4   Para  3   Term      Preterm      AB  1   Living  3     SAB  1   TAB      Ectopic      Multiple      Live Births               Home Medications    Prior to Admission medications   Medication Sig Start Date End Date Taking? Authorizing Provider  amLODipine (NORVASC) 5 MG tablet Take  5 mg by mouth daily.  11/24/18  Yes [provider]  B Complex-C-Folic Acid (RENA-VITE RX PO) Take 1 tablet by mouth daily.   Yes [provider]  carvedilol (COREG) 6.25 MG tablet Take 6.25 mg by mouth 2 (two) times daily with a meal.   Yes [provider]  cholecalciferol (VITAMIN D3) 25 MCG (1000 UT) tablet Take 2,000 Units by mouth daily.   Yes [provider]  cloNIDine (CATAPRES) 0.1 MG tablet Take 0.1 mg by mouth 2 (two) times daily.    Yes [provider]  docusate sodium (COLACE) 100 MG capsule Take 100 mg by mouth at bedtime.   Yes [provider]  fesoterodine (TOVIAZ) 8 MG TB24 tablet  Take 8 mg by mouth daily.    Yes [provider]  furosemide (LASIX) 40 MG tablet Take 20 mg by mouth daily.  09/07/18  Yes [provider]  hydrOXYzine (ATARAX/VISTARIL) 25 MG tablet Take 25 mg by mouth daily.    Yes [provider]  levETIRAcetam (KEPPRA) 1000 MG tablet Take 500 mg by mouth 2 (two) times daily.  05/10/14  Yes [provider]  pantoprazole (PROTONIX) 40 MG tablet Take 40 mg by mouth daily.    Yes [provider]  potassium chloride SA (K-DUR) 20 MEQ tablet Take 20 mEq by mouth daily.  07/09/18  Yes [provider]  sertraline (ZOLOFT) 25 MG tablet Take 25 mg by mouth daily.  11/24/18  Yes [provider]  sevelamer carbonate (RENVELA) 800 MG tablet Take 800-1,600 mg by mouth See admin instructions. Take 2 tablets (1600 mg) by mouth three times daily with meals and 1 tablet (800 mg) twice daily with a snack   Yes [provider]  cephALEXin (KEFLEX) 500 MG capsule Take 1 capsule (500 mg total) by mouth 2 (two) times daily for 7 days. 12/04/18 12/11/18  Fransico Meadow, PA-C    Family History Family History  Problem Relation Age of Onset  . Cancer Mother   . Hypertension Mother   . Cancer Sister        breast and then spread everywhere.  . Diabetes Paternal Grandmother   . Hypertension Paternal Grandmother     Social History Social History   Tobacco Use  . Smoking status: Never Smoker  . Smokeless tobacco: Never Used  Substance Use Topics  . Alcohol use: No    Alcohol/week: 0.0 standard drinks  . Drug use: No     Allergies   Benadryl [diphenhydramine hcl (sleep)], Quinine derivatives, Vancomycin, Azithromycin, Tetracycline, and Zosyn [piperacillin sod-tazobactam so]   Review of Systems Review of Systems  Genitourinary: Positive for hematuria.  All other systems reviewed and are negative.    Physical Exam Updated Vital Signs BP 116/64   Pulse 61   Temp (!) 97 F (36.1 C) (Oral)   Wt  56 kg   LMP  (LMP Unknown)   SpO2 99%   BMI 24.11 kg/m   Physical Exam Vitals signs reviewed.  HENT:     Head: Normocephalic.  Cardiovascular:     Rate and Rhythm: Normal rate.  Pulmonary:     Effort: Pulmonary effort is normal.  Abdominal:     General: Abdomen is flat.  Skin:    General: Skin is warm.  Neurological:     Mental Status: She is alert and oriented to person, place, and time.  Psychiatric:        Mood and Affect: Mood normal.  ED Treatments / Results  Labs (all labs ordered are listed, but only abnormal results are displayed) Labs Reviewed  URINALYSIS, ROUTINE W REFLEX MICROSCOPIC - Abnormal; Notable for the following components:      Result Value   APPearance TURBID (*)    Specific Gravity, Urine 1.001 (*)    Glucose, UA 150 (*)    Hgb urine dipstick LARGE (*)    Protein, ur 30 (*)    Leukocytes,Ua LARGE (*)    Bacteria, UA MANY (*)    All other components within normal limits  URINE CULTURE    EKG None  Radiology No results found.  Procedures Procedures (including critical care time)  Medications Ordered in ED Medications - No data to display   Initial Impression / Assessment and Plan / ED Course  I have reviewed the triage vital signs and the nursing notes.  Pertinent labs & imaging results that were available during my care of the patient were reviewed by me and considered in my medical decision making (see chart for details).        MDM  Ua shows wbc's rbc and bacteria.  Urine culture ordered.  Pt given rx of keflex 500mg  bid x 7 days.  I advised recheck with Urology as scheduled next week   Final Clinical Impressions(s) / ED Diagnoses   Final diagnoses:  Urinary tract infection with hematuria, site unspecified    ED Discharge Orders         Ordered    cephALEXin (KEFLEX) 500 MG capsule  2 times daily     12/04/18 1754        An After Visit Summary was printed and given to the patient.   Fransico Meadow, [place]  12/04/18 1842    Milton Ferguson, MD 12/05/18 1122

## 2018-12-04 NOTE — ED Notes (Addendum)
Catheter irrigated by C Winningham RN. Clear Urine noted.

## 2018-12-07 LAB — URINE CULTURE: Culture: >=100000 — AB

## 2018-12-08 ENCOUNTER — Telehealth: Payer: Self-pay

## 2018-12-08 NOTE — Telephone Encounter (Addendum)
Pharmacy called and said that pt's insurance would not pay for Fosfomycin and she couldn't afford. Talked with Pharm D Parks Neptune and recommeded Levoquin 250 mg PO QOD x 3 doses

## 2018-12-08 NOTE — Progress Notes (Signed)
ED Antimicrobial Stewardship Positive Culture Follow Up   [name] is an 53 y.o. female who presented to [facility] on 12/04/2018 with a chief complaint of  Chief Complaint  Patient presents with  . Hematuria    Recent Results (from the past 720 hour(s))  Urine Culture     Status: Abnormal   Collection Time: 12/04/18  4:40 PM   Specimen: Urine, Catheterized  Result Value Ref Range Status   Specimen Description   Final    URINE, CATHETERIZED Performed at [facility], [address], [place], [place] [address]    Special Requests   Final    NONE Performed at [facility], [address], [place], [place] [address]    Culture (A)  Final    >=100,000 COLONIES/mL ESCHERICHIA COLI >=100,000 COLONIES/mL PSEUDOMONAS AERUGINOSA Confirmed Extended Spectrum Beta-Lactamase Producer (ESBL).  In bloodstream infections from ESBL organisms, carbapenems are preferred over piperacillin/tazobactam. They are shown to have a lower risk of mortality. FOR ECOLI Performed at [facility], [place] [address] [place], [place] [address]    Report Status 12/07/2018 FINAL  Final   Organism ID, Bacteria ESCHERICHIA COLI (A)  Final   Organism ID, Bacteria PSEUDOMONAS AERUGINOSA (A)  Final      Susceptibility   Escherichia coli - MIC*    AMPICILLIN >=32 RESISTANT Resistant     CEFAZOLIN >=64 RESISTANT Resistant     CEFTRIAXONE >=64 RESISTANT Resistant     CIPROFLOXACIN 1 SENSITIVE Sensitive     GENTAMICIN <=1 SENSITIVE Sensitive     IMIPENEM <=0.25 SENSITIVE Sensitive     NITROFURANTOIN <=16 SENSITIVE Sensitive     TRIMETH/SULFA >=320 RESISTANT Resistant     AMPICILLIN/SULBACTAM >=32 RESISTANT Resistant     PIP/TAZO <=4 SENSITIVE Sensitive     Extended ESBL POSITIVE Resistant     * >=100,000 COLONIES/mL ESCHERICHIA COLI   Pseudomonas aeruginosa - MIC*    CEFTAZIDIME 2 SENSITIVE Sensitive     CIPROFLOXACIN <=0.25 SENSITIVE Sensitive     GENTAMICIN <=1 SENSITIVE Sensitive     IMIPENEM 1  SENSITIVE Sensitive     PIP/TAZO <=4 SENSITIVE Sensitive     CEFEPIME <=1 SENSITIVE Sensitive     * >=100,000 COLONIES/mL PSEUDOMONAS AERUGINOSA    [x]  Treated with cephalexin, organism resistant to prescribed antimicrobial []  Patient discharged originally without antimicrobial agent and treatment is now indicated  New antibiotic prescription: fosfomycin 3 g q48 x 2  ED Provider: Merry Proud hedges,pa-c  Wynell Balloon 12/08/2018, 11:31 AM Clinical Pharmacist Monday - Friday phone -  [phone] Saturday - Sunday phone - [phone]

## 2018-12-08 NOTE — Telephone Encounter (Signed)
Post ED Visit - Positive Culture Follow-up: Successful Patient Follow-Up  Culture assessed and recommendations reviewed by:  []  Elenor Quinones, Pharm.D. []  Heide Guile, Pharm.D., BCPS AQ-ID [x]  Parks Neptune, Pharm.D., BCPS []  Alycia Rossetti, Pharm.D., BCPS []  [place], Pharm.D., BCPS, AAHIVP []  Legrand Como, Pharm.D., BCPS, AAHIVP []  Salome Arnt, PharmD, BCPS []  Johnnette Gourd, PharmD, BCPS []  Hughes Better, PharmD, BCPS []  Leeroy Cha, PharmD  Positive urine culture  []  Patient discharged without antimicrobial prescription and treatment is now indicated [x]  Organism is resistant to prescribed ED discharge antimicrobial []  Patient with positive blood cultures  Changes discussed with ED provider: Okey Regal PA  New antibiotic prescription Fosfomycin 3gm q 48 hrs x 2 Called to [place] [phone]  Contacted patient, date 12/08/2018, time 1223   Genia Del 12/08/2018, 12:22 PM

## 2018-12-15 DIAGNOSIS — L89314 Pressure ulcer of right buttock, stage 4: Secondary | ICD-10-CM | POA: Insufficient documentation

## 2019-04-30 ENCOUNTER — Other Ambulatory Visit: Payer: Self-pay

## 2019-04-30 ENCOUNTER — Encounter (HOSPITAL_COMMUNITY): Payer: Self-pay | Admitting: *Deleted

## 2019-04-30 ENCOUNTER — Emergency Department (HOSPITAL_COMMUNITY)
Admission: EM | Admit: 2019-04-30 | Discharge: 2019-04-30 | Disposition: A | Discharge status: Home / Self Care | Payer: Medicare Other | Attending: Emergency Medicine | Admitting: Emergency Medicine

## 2019-04-30 DIAGNOSIS — Z992 Dependence on renal dialysis: Secondary | ICD-10-CM | POA: Insufficient documentation

## 2019-04-30 DIAGNOSIS — N184 Chronic kidney disease, stage 4 (severe): Secondary | ICD-10-CM | POA: Insufficient documentation

## 2019-04-30 DIAGNOSIS — Z79899 Other long term (current) drug therapy: Secondary | ICD-10-CM | POA: Insufficient documentation

## 2019-04-30 DIAGNOSIS — T839XXA Unspecified complication of genitourinary prosthetic device, implant and graft, initial encounter: Secondary | ICD-10-CM | POA: Diagnosis not present

## 2019-04-30 DIAGNOSIS — I129 Hypertensive chronic kidney disease with stage 1 through stage 4 chronic kidney disease, or unspecified chronic kidney disease: Secondary | ICD-10-CM | POA: Insufficient documentation

## 2019-04-30 DIAGNOSIS — Y732 Prosthetic and other implants, materials and accessory gastroenterology and urology devices associated with adverse incidents: Secondary | ICD-10-CM | POA: Insufficient documentation

## 2019-04-30 NOTE — ED Provider Notes (Signed)
[facility] EMERGENCY DEPARTMENT Provider Note   CSN: [id number] Arrival date & time: 04/30/19  1738     History   Chief Complaint No chief complaint on file.   HPI [name] is a 53 y.o. female.     HPI    [name] is a 53 y.o. female with a PMH of paraplegia, seizures, HTN, TBI, and CKD on dialysis who presents to the Emergency Department requesting to have her foley catheter replaced.  She states that she completed her dialysis treatment today and when she returned home around 4:30pm she noticed that her bed sheets were wet and that her foley catheter was out.  She denies pain or discomfort, fever or chills.  Suprapubic catheter has been temporarily switched to a foley catheter for approximately two months, but she is scheduled in January to have the suprapubic replaced.  She denies fever, chills, cloudy or dark urine.  Last treated for UTI one month ago.     Past Medical History:  Diagnosis Date  . Abnormal uterine bleeding (AUB) 06/15/2014  . Cancer ([place])    uterine  . High blood pressure   . Paraplegia (lower)   . Seizure disorder ([place])   . Seizures ([place])   . Suprapubic catheter ([place])   . Urinary tract infection     Patient Active Problem List   Diagnosis Date Noted  . 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 chronic kidney disease ([place]) 04/25/2017  . AKI (acute kidney injury) ([place]) 04/25/2017  . Dehydration 04/25/2017  . CKD (chronic kidney disease) stage 4, GFR 15-29 ml/min (HCC) 04/25/2017  . Metabolic acidosis 99/37/1696  . Altered mental status 06/22/2016  . Altered mental state 08/16/2015  . Pressure ulcer 05/16/2015  . Acute encephalopathy 05/15/2015  . Malnutrition ([place])   . Encephalopathy acute 10/10/2014  . Sepsis ([place]) 10/10/2014  . HCAP (healthcare-associated pneumonia) 10/10/2014  . Encephalomalacia 10/10/2014  . TBI (traumatic brain injury) ([place]) 10/10/2014  . Severe protein-calorie malnutrition ([place]) 10/10/2014  . UTI (urinary tract infection)  08/26/2014  . Hypokalemia 08/26/2014  . Abnormal uterine bleeding (AUB) 06/15/2014  . High blood pressure   . Seizure disorder ([place])   . Status epilepticus ([place]) 05/02/2014  . Hypertensive emergency without congestive heart failure 05/02/2014  . Acute renal failure ([place]) 05/21/2013  . Hyperkalemia 05/21/2013  . Hypothermia 05/21/2013  . Paraplegia ([place]) 06/25/2012  . Suprapubic catheter ([place]) 06/25/2012  . Decubitus ulcer of buttock 06/25/2012    Past Surgical History:  Procedure Laterality Date  . BACK SURGERY     Pt stated "before 2000"  . ESOPHAGOGASTRODUODENOSCOPY N/A 09/20/2015   Procedure: ESOPHAGOGASTRODUODENOSCOPY (EGD);  Surgeon: Rogene Houston, MD;  Location[facility];  Service: Endoscopy;  Laterality: N/A;  730  . IR CATHETER TUBE CHANGE  04/02/2018  . PERCUTANEOUS ENDOSCOPIC GASTROSTOMY (PEG) REMOVAL N/A 09/20/2015   Procedure: PERCUTANEOUS ENDOSCOPIC GASTROSTOMY (PEG) REMOVAL;  Surgeon: Rogene Houston, MD;  Location[facility];  Service: Endoscopy;  Laterality: N/A;     OB History    Gravida  4   Para  3   Term      Preterm      AB  1   Living  3     SAB  1   TAB      Ectopic      Multiple      Live Births               Home Medications    Prior  to Admission medications   Medication Sig Start Date End Date Taking? Authorizing Provider  amLODipine (NORVASC) 5 MG tablet Take 5 mg by mouth daily.  11/24/18   [provider]  B Complex-C-Folic Acid (RENA-VITE RX PO) Take 1 tablet by mouth daily.    [provider]  carvedilol (COREG) 6.25 MG tablet Take 6.25 mg by mouth 2 (two) times daily with a meal.    [provider]  cholecalciferol (VITAMIN D3) 25 MCG (1000 UT) tablet Take 2,000 Units by mouth daily.    [provider]  cloNIDine (CATAPRES) 0.1 MG tablet Take 0.1 mg by mouth 2 (two) times daily.     [provider]  docusate sodium (COLACE) 100 MG capsule Take 100 mg by mouth at  bedtime.    [provider]  fesoterodine (TOVIAZ) 8 MG TB24 tablet Take 8 mg by mouth daily.     [provider]  furosemide (LASIX) 40 MG tablet Take 20 mg by mouth daily.  09/07/18   [provider]  hydrOXYzine (ATARAX/VISTARIL) 25 MG tablet Take 25 mg by mouth daily.     [provider]  levETIRAcetam (KEPPRA) 1000 MG tablet Take 500 mg by mouth 2 (two) times daily.  05/10/14   [provider]  pantoprazole (PROTONIX) 40 MG tablet Take 40 mg by mouth daily.     [provider]  potassium chloride SA (K-DUR) 20 MEQ tablet Take 20 mEq by mouth daily.  07/09/18   [provider]  sertraline (ZOLOFT) 25 MG tablet Take 25 mg by mouth daily.  11/24/18   [provider]  sevelamer carbonate (RENVELA) 800 MG tablet Take 800-1,600 mg by mouth See admin instructions. Take 2 tablets (1600 mg) by mouth three times daily with meals and 1 tablet (800 mg) twice daily with a snack    [provider]    Family History Family History  Problem Relation Age of Onset  . Cancer Mother   . Hypertension Mother   . Cancer Sister        breast and then spread everywhere.  . Diabetes Paternal Grandmother   . Hypertension Paternal Grandmother     Social History Social History   Tobacco Use  . Smoking status: Never Smoker  . Smokeless tobacco: Never Used  Substance Use Topics  . Alcohol use: No    Alcohol/week: 0.0 standard drinks  . Drug use: No     Allergies   Benadryl [diphenhydramine hcl (sleep)], Quinine derivatives, Vancomycin, Azithromycin, Tetracycline, and Zosyn [piperacillin sod-tazobactam so]   Review of Systems Review of Systems  Constitutional: Negative for appetite change, chills and fever.  Cardiovascular: Negative for chest pain.  Gastrointestinal: Negative for abdominal pain, nausea and vomiting.  Genitourinary: Negative for difficulty urinating, dysuria, flank pain, vaginal bleeding and vaginal  discharge.       Foley catheter issue     Physical Exam Updated Vital Signs BP (!) 125/48 (BP Location: Right Arm)   Pulse 69   Temp 98.4 F (36.9 C) (Oral)   Resp 16   Ht 5' (1.524 m)   Wt 61 kg   LMP  (LMP Unknown)   SpO2 98%   BMI 26.26 kg/m   Physical Exam Vitals signs and nursing note reviewed.  Constitutional:      General: She is not in acute distress.    Appearance: Normal appearance. She is not ill-appearing.  HENT:     Head: Atraumatic.  Cardiovascular:  Rate and Rhythm: Normal rate and regular rhythm.     Pulses: Normal pulses.  Pulmonary:     Effort: Pulmonary effort is normal.     Breath sounds: Normal breath sounds.  Abdominal:     General: There is no distension.     Palpations: Abdomen is soft.     Tenderness: There is no abdominal tenderness. There is no right CVA tenderness, left CVA tenderness or guarding.  Skin:    General: Skin is warm.     Capillary Refill: Capillary refill takes less than 2 seconds.     Findings: No erythema or rash.  Neurological:     Mental Status: She is alert.      ED Treatments / Results  Labs (all labs ordered are listed, but only abnormal results are displayed) Labs Reviewed - No data to display  EKG None  Radiology No results found.  Procedures Procedures (including critical care time)  Medications Ordered in ED Medications - No data to display   Initial Impression / Assessment and Plan / ED Course  I have reviewed the triage vital signs and the nursing notes.  Pertinent labs & imaging results that were available during my care of the patient were reviewed by me and considered in my medical decision making (see chart for details).       Pt is well appearing.  Non-toxic.  Vitals reviewed.    On recheck,  foley catheter replaced by nursing staff using #94 Fr, no complications.  Straw colored urine flowing into the foley cath bag.  Patient stating that she is ready to be d/c home.    Spoke with  pt's daughter, who is her legal guardian.     Final Clinical Impressions(s) / ED Diagnoses   Final diagnoses:  Problem with Foley catheter, initial encounter [facility])    ED Discharge Orders    None       Kem Parkinson, PA-C 04/30/19 1926    Maudie Flakes, MD 05/01/19 1104

## 2019-04-30 NOTE — Discharge Instructions (Addendum)
Follow up with your doctor for recheck.  °

## 2019-04-30 NOTE — ED Triage Notes (Signed)
Patient had just gotten home from dialysis and realized her foley catheter had fallen out.  Patient is alert, oriented and in no distress.

## 2019-06-09 ENCOUNTER — Other Ambulatory Visit (HOSPITAL_COMMUNITY)
Admission: RE | Admit: 2019-06-09 | Discharge: 2019-06-09 | Disposition: A | Discharge status: Home / Self Care | Payer: Medicare Other | Source: Other Acute Inpatient Hospital | Attending: Medical | Admitting: Medical

## 2019-06-09 DIAGNOSIS — D649 Anemia, unspecified: Secondary | ICD-10-CM | POA: Insufficient documentation

## 2019-06-09 LAB — HEMOGLOBIN AND HEMATOCRIT, BLOOD
HCT: 25.9 % — ABNORMAL LOW (ref 36.0–46.0)
Hemoglobin: 8.4 g/dL — ABNORMAL LOW (ref 12.0–15.0)

## 2020-02-07 DIAGNOSIS — Z992 Dependence on renal dialysis: Secondary | ICD-10-CM | POA: Diagnosis not present

## 2020-02-07 DIAGNOSIS — N186 End stage renal disease: Secondary | ICD-10-CM | POA: Diagnosis not present

## 2020-02-07 DIAGNOSIS — N2581 Secondary hyperparathyroidism of renal origin: Secondary | ICD-10-CM | POA: Diagnosis not present

## 2020-02-08 DIAGNOSIS — L89314 Pressure ulcer of right buttock, stage 4: Secondary | ICD-10-CM | POA: Diagnosis not present

## 2020-06-14 ENCOUNTER — Ambulatory Visit: Payer: Self-pay | Admitting: *Deleted

## 2020-06-14 NOTE — Telephone Encounter (Signed)
[phone] pt has tested negative for covid however has had diarrhea off on since Friday night. Please advise, seeking advice.  Patient c/o diarrhea since Friday night and on and off throughout the week. Today 3 watery diarrheal stools reported . Denies fever, abdominal pain due to paraplegic, no dizziness. Poor appetite and drinking water. Patient took 1 st dose of pepto bismol tonight.  encouraged patient to contact PCP if diarrhea continues. Reviewed signs and symptoms of dehydration.  Care advise given. Patient verbalized understanding of care advise and to call back or go to ED if symptoms worsen.    Reason for Disposition . [1] MILD diarrhea (e.g., 1-3 or more stools than normal in past 24 hours) without known cause AND [2] present >  7 days  Answer Assessment - Initial Assessment Questions 1. DIARRHEA SEVERITY: "How bad is the diarrhea?" "How many extra stools have you had in the past 24 hours than normal?"    - NO DIARRHEA (SCALE 0)   - MILD (SCALE 1-3): Few loose or mushy BMs; increase of 1-3 stools over normal daily number of stools; mild increase in ostomy output.   -  MODERATE (SCALE 4-7): Increase of 4-6 stools daily over normal; moderate increase in ostomy output. * SEVERE (SCALE 8-10; OR 'WORST POSSIBLE'): Increase of 7 or more stools daily over normal; moderate increase in ostomy output; incontinence.     mild 2. ONSET: "When did the diarrhea begin?"      Friday  3. BM CONSISTENCY: "How loose or watery is the diarrhea?"      watery 4. VOMITING: "Are you also vomiting?" If Yes, ask: "How many times in the past 24 hours?"      No  5. ABDOMINAL PAIN: "Are you having any abdominal pain?" If Yes, ask: "What does it feel like?" (e.g., crampy, dull, intermittent, constant)      Paralyzed from waist down  6. ABDOMINAL PAIN SEVERITY: If present, ask: "How bad is the pain?"  (e.g., Scale 1-10; mild, moderate, or severe)   - MILD (1-3): doesn't interfere with normal activities, abdomen  soft and not tender to touch    - MODERATE (4-7): interferes with normal activities or awakens from sleep, tender to touch    - SEVERE (8-10): excruciating pain, doubled over, unable to do any normal activities       na 7. ORAL INTAKE: If vomiting, "Have you been able to drink liquids?" "How much fluids have you had in the past 24 hours?"     na 8. HYDRATION: "Any signs of dehydration?" (e.g., dry mouth [not just dry lips], too weak to stand, dizziness, new weight loss) "When did you last urinate?"     Headache  9. EXPOSURE: "Have you traveled to a foreign country recently?" "Have you been exposed to anyone with diarrhea?" "Could you have eaten any food that was spoiled?"     Na  10. ANTIBIOTIC USE: "Are you taking antibiotics now or have you taken antibiotics in the past 2 months?"       no 11. OTHER SYMPTOMS: "Do you have any other symptoms?" (e.g., fever, blood in stool)       No  12. PREGNANCY: "Is there any chance you are pregnant?" "When was your last menstrual period?"       na  Protocols used: [facility]

## 2021-06-02 ENCOUNTER — Other Ambulatory Visit: Payer: Self-pay

## 2021-06-02 ENCOUNTER — Encounter (HOSPITAL_COMMUNITY): Payer: Self-pay

## 2021-06-02 ENCOUNTER — Emergency Department (HOSPITAL_COMMUNITY)
Admission: EM | Admit: 2021-06-02 | Discharge: 2021-06-02 | Disposition: A | Discharge status: Home / Self Care | Payer: Medicare Other | Attending: Emergency Medicine | Admitting: Emergency Medicine

## 2021-06-02 DIAGNOSIS — T83030A Leakage of cystostomy catheter, initial encounter: Secondary | ICD-10-CM | POA: Diagnosis present

## 2021-06-02 DIAGNOSIS — Y69 Unspecified misadventure during surgical and medical care: Secondary | ICD-10-CM | POA: Diagnosis not present

## 2021-06-02 DIAGNOSIS — T83010A Breakdown (mechanical) of cystostomy catheter, initial encounter: Secondary | ICD-10-CM

## 2021-06-02 NOTE — ED Provider Notes (Signed)
[facility] Provider Note   CSN: [id number] Arrival date & time: 06/02/21  1020     History  No chief complaint on file.   [name] is a 56 y.o. female.  HPI Patient presents with feeling she needs a suprapubic catheter placed.  States she has been leaking around the catheter.Marland Kitchen  Has had catheter in for years.  It is paraplegic.  No fevers.  States she is due to have it replaced later this week.  Has not had issues with difficulty getting it replaced.    Home Medications Prior to Admission medications   Medication Sig Start Date End Date Taking? Authorizing Provider  amLODipine (NORVASC) 5 MG tablet Take 5 mg by mouth daily.  11/24/18   [provider]  B Complex-C-Folic Acid (RENA-VITE RX PO) Take 1 tablet by mouth daily.    [provider]  carvedilol (COREG) 6.25 MG tablet Take 6.25 mg by mouth 2 (two) times daily with a meal.    [provider]  cholecalciferol (VITAMIN D3) 25 MCG (1000 UT) tablet Take 2,000 Units by mouth daily.    [provider]  cloNIDine (CATAPRES) 0.1 MG tablet Take 0.1 mg by mouth 2 (two) times daily.     [provider]  docusate sodium (COLACE) 100 MG capsule Take 100 mg by mouth at bedtime.    [provider]  fesoterodine (TOVIAZ) 8 MG TB24 tablet Take 8 mg by mouth daily.     [provider]  furosemide (LASIX) 40 MG tablet Take 20 mg by mouth daily.  09/07/18   [provider]  hydrOXYzine (ATARAX/VISTARIL) 25 MG tablet Take 25 mg by mouth daily.     [provider]  levETIRAcetam (KEPPRA) 1000 MG tablet Take 500 mg by mouth 2 (two) times daily.  05/10/14   [provider]  pantoprazole (PROTONIX) 40 MG tablet Take 40 mg by mouth daily.     [provider]  potassium chloride SA (K-DUR) 20 MEQ tablet Take 20 mEq by mouth daily.  07/09/18   [provider]  sertraline (ZOLOFT) 25 MG tablet Take 25 mg by mouth daily.   11/24/18   [provider]  sevelamer carbonate (RENVELA) 800 MG tablet Take 800-1,600 mg by mouth See admin instructions. Take 2 tablets (1600 mg) by mouth three times daily with meals and 1 tablet (800 mg) twice daily with a snack    [provider]      Allergies    Benadryl [diphenhydramine hcl (sleep)], Quinine derivatives, Vancomycin, Azithromycin, Tetracycline, and Zosyn [piperacillin sod-tazobactam so]    Review of Systems   Review of Systems  Constitutional:  Negative for fatigue and fever.  Neurological:  Positive for weakness.   Physical Exam Updated Vital Signs BP 131/69 (BP Location: Right Arm)    Pulse 78    Temp 98.3 F (36.8 C) (Oral)    Resp 18    Ht 5' (1.524 m)    Wt 63.5 kg    LMP  (LMP Unknown)    SpO2 99%    BMI 27.34 kg/m  Physical Exam Vitals and nursing note reviewed.  Abdominal:     Tenderness: There is no abdominal tenderness.     Comments: Suprapubic catheter in place.  No surrounding erythema.  No tenderness.  Musculoskeletal:        General: No tenderness.     Cervical back: Neck supple.     Comments: Patient is paraplegic.  Neurological:  Mental Status: She is alert.    ED Results / Procedures / Treatments   Labs (all labs ordered are listed, but only abnormal results are displayed) Labs Reviewed - No data to display  EKG None  Radiology No results found.  Procedures BLADDER CATHETERIZATION  Date/Time: 06/02/2021 3:54 PM Performed by: Davonna Belling, MD Authorized by: Davonna Belling, MD   Consent:    Consent obtained:  Verbal   Consent given by:  Patient   Risks discussed:  False passage, infection and incomplete procedure   Alternatives discussed:  No treatment and delayed treatment Universal protocol:    Patient identity confirmed:  Verbally with patient Pre-procedure details:    Procedure purpose:  Therapeutic   Preparation: Patient was prepped and draped in usual sterile fashion   Anesthesia:     Anesthesia method:  None Procedure details:    Procedure performed by provider due to: Suprapubic catheter.   Catheter type:  Foley   Catheter size:  20 Fr   Bladder irrigation: no     Number of attempts:  1   Urine characteristics:  Clear Post-procedure details:    Procedure completion:  Tolerated well, no immediate complications Comments:     20 French suprapubic catheter placed and 10 cc of sterile saline placed in balloon.    Medications Ordered in ED Medications - No data to display  ED Course/ Medical Decision Making/ A&P                           Medical Decision Making  Patient presents for suprapubic catheter placement.  It was replaced.  Draining freely.  It was a 20 [place] catheter.  Outpatient follow-up as needed.        Final Clinical Impression(s) / ED Diagnoses Final diagnoses:  Suprapubic catheter dysfunction, initial encounter [facility])    Rx / [place] Orders ED Discharge Orders     None         Davonna Belling, MD 06/02/21 1555

## 2021-06-02 NOTE — ED Notes (Signed)
Cath supplies placed in patient room for MD. MD aware.

## 2021-06-02 NOTE — ED Triage Notes (Signed)
Patient reports leaking around urinary catheter. States that she has appointment next week to have changed but does not want to wait until then. Believes cath is 4F. Denies pain.

## 2021-08-24 ENCOUNTER — Other Ambulatory Visit: Payer: Self-pay

## 2021-08-24 ENCOUNTER — Inpatient Hospital Stay (HOSPITAL_COMMUNITY)
Admission: EM | Admit: 2021-08-24 | Discharge: 2021-08-26 | DRG: 579 | Disposition: A | Discharge status: Other Acute Inpatient Hospital | Payer: Medicare Other | Attending: Family Medicine | Admitting: Family Medicine

## 2021-08-24 ENCOUNTER — Encounter (HOSPITAL_COMMUNITY): Payer: Self-pay | Admitting: *Deleted

## 2021-08-24 DIAGNOSIS — I10 Essential (primary) hypertension: Secondary | ICD-10-CM | POA: Diagnosis present

## 2021-08-24 DIAGNOSIS — L03317 Cellulitis of buttock: Secondary | ICD-10-CM | POA: Diagnosis present

## 2021-08-24 DIAGNOSIS — Z79899 Other long term (current) drug therapy: Secondary | ICD-10-CM

## 2021-08-24 DIAGNOSIS — Z888 Allergy status to other drugs, medicaments and biological substances status: Secondary | ICD-10-CM

## 2021-08-24 DIAGNOSIS — Z992 Dependence on renal dialysis: Secondary | ICD-10-CM

## 2021-08-24 DIAGNOSIS — L89153 Pressure ulcer of sacral region, stage 3: Secondary | ICD-10-CM | POA: Diagnosis present

## 2021-08-24 DIAGNOSIS — F419 Anxiety disorder, unspecified: Secondary | ICD-10-CM | POA: Diagnosis present

## 2021-08-24 DIAGNOSIS — I120 Hypertensive chronic kidney disease with stage 5 chronic kidney disease or end stage renal disease: Secondary | ICD-10-CM | POA: Diagnosis present

## 2021-08-24 DIAGNOSIS — L0231 Cutaneous abscess of buttock: Secondary | ICD-10-CM | POA: Diagnosis not present

## 2021-08-24 DIAGNOSIS — Z20822 Contact with and (suspected) exposure to covid-19: Secondary | ICD-10-CM | POA: Diagnosis present

## 2021-08-24 DIAGNOSIS — G8220 Paraplegia, unspecified: Secondary | ICD-10-CM | POA: Diagnosis present

## 2021-08-24 DIAGNOSIS — L02415 Cutaneous abscess of right lower limb: Secondary | ICD-10-CM | POA: Diagnosis present

## 2021-08-24 DIAGNOSIS — L89310 Pressure ulcer of right buttock, unstageable: Secondary | ICD-10-CM | POA: Diagnosis present

## 2021-08-24 DIAGNOSIS — D631 Anemia in chronic kidney disease: Secondary | ICD-10-CM | POA: Diagnosis present

## 2021-08-24 DIAGNOSIS — D649 Anemia, unspecified: Secondary | ICD-10-CM | POA: Diagnosis not present

## 2021-08-24 DIAGNOSIS — K219 Gastro-esophageal reflux disease without esophagitis: Secondary | ICD-10-CM | POA: Diagnosis present

## 2021-08-24 DIAGNOSIS — L89320 Pressure ulcer of left buttock, unstageable: Secondary | ICD-10-CM | POA: Diagnosis present

## 2021-08-24 DIAGNOSIS — Z883 Allergy status to other anti-infective agents status: Secondary | ICD-10-CM

## 2021-08-24 DIAGNOSIS — G40909 Epilepsy, unspecified, not intractable, without status epilepticus: Secondary | ICD-10-CM

## 2021-08-24 DIAGNOSIS — N186 End stage renal disease: Secondary | ICD-10-CM | POA: Diagnosis present

## 2021-08-24 DIAGNOSIS — D62 Acute posthemorrhagic anemia: Secondary | ICD-10-CM | POA: Diagnosis present

## 2021-08-24 DIAGNOSIS — Z8542 Personal history of malignant neoplasm of other parts of uterus: Secondary | ICD-10-CM

## 2021-08-24 DIAGNOSIS — L8990 Pressure ulcer of unspecified site, unspecified stage: Secondary | ICD-10-CM | POA: Insufficient documentation

## 2021-08-24 LAB — CBC WITH DIFFERENTIAL/PLATELET
Abs Immature Granulocytes: 0.06 10*3/uL (ref 0.00–0.07)
Basophils Absolute: 0.1 10*3/uL (ref 0.0–0.1)
Basophils Relative: 1 %
Eosinophils Absolute: 0.3 10*3/uL (ref 0.0–0.5)
Eosinophils Relative: 3 %
HCT: 21.7 % — ABNORMAL LOW (ref 36.0–46.0)
Hemoglobin: 6.2 g/dL — CL (ref 12.0–15.0)
Immature Granulocytes: 1 %
Lymphocytes Relative: 13 %
Lymphs Abs: 1.3 10*3/uL (ref 0.7–4.0)
MCH: 29.2 pg (ref 26.0–34.0)
MCHC: 28.6 g/dL — ABNORMAL LOW (ref 30.0–36.0)
MCV: 102.4 fL — ABNORMAL HIGH (ref 80.0–100.0)
Monocytes Absolute: 0.6 10*3/uL (ref 0.1–1.0)
Monocytes Relative: 6 %
Neutro Abs: 7.1 10*3/uL (ref 1.7–7.7)
Neutrophils Relative %: 76 %
Platelets: 336 10*3/uL (ref 150–400)
RBC: 2.12 MIL/uL — ABNORMAL LOW (ref 3.87–5.11)
RDW: 16.5 % — ABNORMAL HIGH (ref 11.5–15.5)
WBC: 9.3 10*3/uL (ref 4.0–10.5)
nRBC: 0 % (ref 0.0–0.2)

## 2021-08-24 LAB — FERRITIN: Ferritin: 1338 ng/mL — ABNORMAL HIGH (ref 11–307)

## 2021-08-24 LAB — BASIC METABOLIC PANEL
Anion gap: 4 — ABNORMAL LOW (ref 5–15)
BUN: 10 mg/dL (ref 6–20)
CO2: 31 mmol/L (ref 22–32)
Calcium: 8.1 mg/dL — ABNORMAL LOW (ref 8.9–10.3)
Chloride: 107 mmol/L (ref 98–111)
Creatinine, Ser: 2.04 mg/dL — ABNORMAL HIGH (ref 0.44–1.00)
GFR, Estimated: 29 mL/min — ABNORMAL LOW (ref >60)
Glucose, Bld: 91 mg/dL (ref 70–99)
Potassium: 3.5 mmol/L (ref 3.5–5.1)
Sodium: 142 mmol/L (ref 135–145)

## 2021-08-24 LAB — LACTIC ACID, PLASMA
Lactic Acid, Venous: 0.8 mmol/L (ref 0.5–1.9)
Lactic Acid, Venous: 1.1 mmol/L (ref 0.5–1.9)

## 2021-08-24 LAB — RETICULOCYTES
Immature Retic Fract: 18.9 % — ABNORMAL HIGH (ref 2.3–15.9)
RBC.: 2.36 MIL/uL — ABNORMAL LOW (ref 3.87–5.11)
Retic Count, Absolute: 55.7 10*3/uL (ref 19.0–186.0)
Retic Ct Pct: 2.4 % (ref 0.4–3.1)

## 2021-08-24 LAB — IRON AND TIBC
Iron: 47 ug/dL (ref 28–170)
Saturation Ratios: 31 % (ref 10.4–31.8)
TIBC: 152 ug/dL — ABNORMAL LOW (ref 250–450)
UIBC: 105 ug/dL

## 2021-08-24 LAB — RESP PANEL BY RT-PCR (FLU A&B, COVID) ARPGX2
Influenza A by PCR: NEGATIVE
Influenza B by PCR: NEGATIVE
SARS Coronavirus 2 by RT PCR: NEGATIVE

## 2021-08-24 LAB — POC OCCULT BLOOD, ED: Fecal Occult Bld: NEGATIVE

## 2021-08-24 LAB — FOLATE: Folate: 41.5 ng/mL (ref >5.9)

## 2021-08-24 LAB — VITAMIN B12: Vitamin B-12: 1113 pg/mL — ABNORMAL HIGH (ref 180–914)

## 2021-08-24 LAB — PREPARE RBC (CROSSMATCH)

## 2021-08-24 MED ORDER — VANCOMYCIN HCL IN DEXTROSE 1-5 GM/200ML-% IV SOLN
1000.0000 mg | Freq: Once | INTRAVENOUS | Status: DC
  Administered 2021-08-24: 1000 mg via INTRAVENOUS
  Filled 2021-08-24: qty 200

## 2021-08-24 MED ORDER — SODIUM CHLORIDE 0.9 % IV SOLN
10.0000 mL/h | Freq: Once | INTRAVENOUS | Status: AC
  Administered 2021-08-24: 10 mL/h via INTRAVENOUS

## 2021-08-24 NOTE — ED Provider Notes (Signed)
I assumed care of patient at shift change from previous team, please see their note for full H&P. ?Briefly once remainder of labs result plan is to call wake floor admission as she is followed through [facility] with wound care.  Previous provider spoke with wound care who will see patient in consult. ?She was noted to be anemic on the labs that resulted prior to shift change and 1 unit of blood was ordered. ? ?Anemia panel was ordered, BMP is unremarkable.  Flu and COVID test is negative.  Hemoccult is negative.  Rectal exam to obtain Hemoccult was performed with RN chaperone present.  There was no frank melena, scant stool in the rectal canal. ? ?I called the [facility], however they state that they are currently at capacity and unable to accept interfacility transfers. ? ?I spoke with hospitalist who will see the patient for admission. ? ?The patient appears reasonably stabilized for admission considering the current resources, flow, and capabilities available in the ED at this time, and I doubt any other [facility] requiring further screening and/or treatment in the ED prior to admission assuming timely admission and bed placement. ? ?Note: Portions of this report may have been transcribed using voice recognition software. Every effort was made to ensure accuracy; however, inadvertent computerized transcription errors may be present ? ? ? ?  ?Lorin Glass, [place] ?08/24/21 2302 ? ?  ?Davonna Belling, MD ?08/25/21 0017 ? ?

## 2021-08-24 NOTE — ED Provider Notes (Signed)
?[facility] ?Provider Note ? ? ?CSN: [id number] ?Arrival date & time: 08/24/21  1539 ? ?  ? ?History ? ?Chief Complaint  ?Patient presents with  ? Abscess  ?  Rt groin ?  ? ? ?[name] is a 56 y.o. female. ? ? ?Abscess ? ?Patient with medical history notable for seizure disorder, paraplegia, chronic decubitus ulcers followed by wound care, dialysis Monday, Wednesday Friday presents today due to gluteal abscess.  Patient has been followed by wound care for this, she is been having packing for the last few weeks.  During her last week of dialysis sessions she has been getting IV vancomycin.  This morning when she woke up she had purulent drainage coming from the area. Daughter tried draining more of the abscess with pressure. She was seen at nephrology for dialysis and sent to ED for further admission for IV antibiotics per patient..  Patient denies any fevers today, had a fever about a week ago.  She is not currently on any oral antibiotics.  She had a full session of dialysis earlier today. ? ?Home Medications ?Prior to Admission medications   ?Medication Sig Start Date End Date Taking? Authorizing Provider  ?amLODipine (NORVASC) 5 MG tablet Take 5 mg by mouth See admin instructions. Take 1 tablet on Monday,Wednesday and Friday then take 2 tablets on Tuesday,Thursday,Saturday and Sunday. 11/24/18  Yes [provider]  ?B Complex-C-Folic Acid (RENA-VITE RX PO) Take 1 tablet by mouth daily.   Yes [provider]  ?carvedilol (COREG) 6.25 MG tablet Take 6.25 mg by mouth 2 (two) times daily with a meal.   Yes [provider]  ?cloNIDine (CATAPRES) 0.1 MG tablet Take 0.1 mg by mouth 2 (two) times daily.    Yes [provider]  ?docusate sodium (COLACE) 100 MG capsule Take 100 mg by mouth at bedtime.   Yes [provider]  ?fesoterodine (TOVIAZ) 8 MG TB24 tablet Take 8 mg by mouth daily.    Yes [provider]  ?furosemide (LASIX) 20 MG  tablet Take 20 mg by mouth daily. 08/14/21  Yes [provider]  ?levETIRAcetam (KEPPRA) 500 MG tablet Take 1,000 mg by mouth daily. 08/09/21  Yes [provider]  ?Multiple Vitamin (MULTIVITAMIN) tablet Take 1 tablet by mouth daily.   Yes [provider]  ?pantoprazole (PROTONIX) 40 MG tablet Take 40 mg by mouth daily.    Yes [provider]  ?sevelamer carbonate (RENVELA) 800 MG tablet Take 800 mg by mouth 3 (three) times daily with meals.   Yes [provider]  ?hydrOXYzine (ATARAX/VISTARIL) 25 MG tablet Take 25 mg by mouth daily.     [provider]  ?   ? ?Allergies    ?Benadryl [diphenhydramine hcl (sleep)], Quinine derivatives, Vancomycin, Azithromycin, Tetracycline, and Zosyn [piperacillin sod-tazobactam so]   ? ?Review of Systems   ?Review of Systems ? ?Physical Exam ?Updated Vital Signs ?BP (!) 158/65   Pulse 86   Temp 98.2 ?F (36.8 ?C) (Oral)   Resp 18   Ht '5\' 3"'$  (1.6 m)   Wt 57.7 kg   LMP  (LMP Unknown)   SpO2 95%   BMI 22.55 kg/m?  ?Physical Exam ?Vitals and nursing note reviewed. Exam conducted with a chaperone present.  ?Constitutional:   ?   General: She is not in acute distress. ?   Appearance: Normal appearance.  ?HENT:  ?   Head: Normocephalic and atraumatic.  ?Eyes:  ?   General: No scleral  icterus. ?   Extraocular Movements: Extraocular movements intact.  ?   Pupils: Pupils are equal, round, and reactive to light.  ?Cardiovascular:  ?   Rate and Rhythm: Normal rate and regular rhythm.  ?Pulmonary:  ?   Effort: Pulmonary effort is normal.  ?   Breath sounds: Normal breath sounds.  ?Skin: ?   Coloration: Skin is not jaundiced.  ?   Comments: Multiple chronic ulcers ulcers.  Patient has purulent drainage from right gluteal abscess.  The wound tracks roughly 5 cm upwards.   ?Neurological:  ?   Mental Status: She is alert. Mental status is at baseline.  ? ? ? ? ? ?ED Results / Procedures / Treatments   ?Labs ?(all labs ordered are listed,  but only abnormal results are displayed) ?Labs Reviewed  ?BASIC METABOLIC PANEL - Abnormal; Notable for the following components:  ?    Result Value  ? Creatinine, Ser 2.04 (*)   ? Calcium 8.1 (*)   ? GFR, Estimated 29 (*)   ? Anion gap 4 (*)   ? All other components within normal limits  ?CBC WITH DIFFERENTIAL/PLATELET - Abnormal; Notable for the following components:  ? RBC 2.12 (*)   ? Hemoglobin 6.2 (*)   ? HCT 21.7 (*)   ? MCV 102.4 (*)   ? MCHC 28.6 (*)   ? RDW 16.5 (*)   ? All other components within normal limits  ?CULTURE, BLOOD (ROUTINE X 2)  ?CULTURE, BLOOD (ROUTINE X 2)  ?RESP PANEL BY RT-PCR (FLU A&B, COVID) [id number]  ?LACTIC ACID, PLASMA  ?LACTIC ACID, PLASMA  ?VITAMIN B12  ?FOLATE  ?IRON AND TIBC  ?FERRITIN  ?RETICULOCYTES  ?TYPE AND SCREEN  ?PREPARE RBC (CROSSMATCH)  ? ? ?EKG ?None ? ?Radiology ?No results found. ? ?Procedures ?Marland Kitchen.Incision and Drainage ? ?Date/Time: 08/24/2021 7:03 PM ?Performed by: Sherrill Raring, PA-C ?Authorized by: Sherrill Raring, PA-C  ? ?Consent:  ?  Consent obtained:  Verbal ?  Consent given by:  Patient ?  Risks discussed:  Bleeding, incomplete drainage, pain and damage to other organs ?  Alternatives discussed:  No treatment ?Universal protocol:  ?  Procedure explained and questions answered to patient or proxy's satisfaction: yes   ?  Relevant documents present and verified: yes   ?  Test results available : yes   ?  Imaging studies available: yes   ?  Required blood products, implants, devices, and special equipment available: yes   ?  Site/side marked: yes   ?  Immediately prior to procedure, a time out was called: yes   ?  Patient identity confirmed:  Verbally with patient ?Location:  ?  Type:  Abscess ?  Size:  5 cm ?  Location:  Anogenital ?  Anogenital location:  Perianal ?Pre-procedure details:  ?  Skin preparation:  Chlorhexidine ?Anesthesia:  ?  Anesthesia method:  None ?Procedure type:  ?  Complexity:  Complex ?Procedure details:  ?  Incision types:  Single straight ?   Incision depth:  Subcutaneous ?  Wound management:  Probed and deloculated, irrigated with saline and extensive cleaning ?  Drainage:  Purulent ?  Drainage amount:  Copious ?  Packing materials:  1/4 in gauze ?  Amount 1/4":  3 yards ?Post-procedure details:  ?  Procedure completion:  Tolerated well, no immediate complications ?Marland KitchenCritical Care ?Performed by: Sherrill Raring, PA-C ?Authorized by: Sherrill Raring, PA-C  ? ?Critical care provider statement:  ?  Critical care time (minutes):  30 ?  Critical care start time:  08/24/2021 7:06 PM ?  Critical care end time:  08/24/2021 7:36 PM ?  Critical care time was exclusive of:  Separately billable procedures and treating other patients ?  Critical care was necessary to treat or prevent imminent or life-threatening deterioration of the following conditions:  Circulatory failure ?  Critical care was time spent personally by me on the following activities:  Development of treatment plan with patient or surrogate, discussions with consultants, evaluation of patient's response to treatment, examination of patient, ordering and review of laboratory studies, ordering and review of radiographic studies, ordering and performing treatments and interventions, pulse oximetry, re-evaluation of patient's condition and review of old charts  ? ? ?Medications Ordered in ED ?Medications  ?0.9 %  sodium chloride infusion (has no administration in time range)  ? ? ?ED Course/ Medical Decision Making/ A&P ?  ?                        ?Medical Decision Making ?Amount and/or Complexity of Data Reviewed ?Labs: ordered. ? ?Risk ?Prescription drug management. ? ? ?This patient presents to the ED for concern of gluteal abscess, this involves an extensive number of treatment options, and is a complaint that carries with it a high risk of complications and morbidity.  The differential diagnosis includes abscess, sepsis, cellulitis, necrotizing fasciitis. ? 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 ? ?Additional history obtained:  ? ?Independent historian: Patient's daughter.  I called patient's daughter who has been changing the packing daily.  T

## 2021-08-24 NOTE — ED Notes (Signed)
Blood finished. Pt remains comfortable in the bed. Alert and oriented. All needs addressed  ?

## 2021-08-24 NOTE — ED Notes (Signed)
Called [facility] for wound care at 16:20 ?

## 2021-08-24 NOTE — ED Notes (Signed)
Wound cleaned, packed and covered with dressings.  ?

## 2021-08-24 NOTE — ED Triage Notes (Signed)
Pt brought in by ems for c/o increased wound drainage to right buttock; pt states she called her wound care doctor and he told her to come to ED ?

## 2021-08-24 NOTE — ED Notes (Signed)
Blood transfusion started according to protocol. Consent signed. Pt verbalizes understanding of edp education. Pt alert and oriented. Pt eating bagged lunch per edp approval.  ?

## 2021-08-24 NOTE — ED Notes (Signed)
Critical value-  ?HGB 6.2 ?Reported to edp.  ?See orders  ?

## 2021-08-25 ENCOUNTER — Observation Stay (HOSPITAL_COMMUNITY): Payer: Medicare Other

## 2021-08-25 DIAGNOSIS — K219 Gastro-esophageal reflux disease without esophagitis: Secondary | ICD-10-CM | POA: Diagnosis present

## 2021-08-25 DIAGNOSIS — I10 Essential (primary) hypertension: Secondary | ICD-10-CM | POA: Diagnosis not present

## 2021-08-25 DIAGNOSIS — N186 End stage renal disease: Secondary | ICD-10-CM | POA: Diagnosis not present

## 2021-08-25 DIAGNOSIS — L0231 Cutaneous abscess of buttock: Principal | ICD-10-CM

## 2021-08-25 DIAGNOSIS — F419 Anxiety disorder, unspecified: Secondary | ICD-10-CM | POA: Diagnosis not present

## 2021-08-25 DIAGNOSIS — D631 Anemia in chronic kidney disease: Secondary | ICD-10-CM

## 2021-08-25 DIAGNOSIS — L03317 Cellulitis of buttock: Secondary | ICD-10-CM

## 2021-08-25 DIAGNOSIS — G40909 Epilepsy, unspecified, not intractable, without status epilepticus: Secondary | ICD-10-CM

## 2021-08-25 DIAGNOSIS — Z992 Dependence on renal dialysis: Secondary | ICD-10-CM

## 2021-08-25 DIAGNOSIS — L8990 Pressure ulcer of unspecified site, unspecified stage: Secondary | ICD-10-CM | POA: Insufficient documentation

## 2021-08-25 LAB — CBC WITH DIFFERENTIAL/PLATELET
Abs Immature Granulocytes: 0.07 10*3/uL (ref 0.00–0.07)
Basophils Absolute: 0.1 10*3/uL (ref 0.0–0.1)
Basophils Relative: 1 %
Eosinophils Absolute: 0.6 10*3/uL — ABNORMAL HIGH (ref 0.0–0.5)
Eosinophils Relative: 7 %
HCT: 23.7 % — ABNORMAL LOW (ref 36.0–46.0)
Hemoglobin: 7.4 g/dL — ABNORMAL LOW (ref 12.0–15.0)
Immature Granulocytes: 1 %
Lymphocytes Relative: 22 %
Lymphs Abs: 1.7 10*3/uL (ref 0.7–4.0)
MCH: 31.2 pg (ref 26.0–34.0)
MCHC: 31.2 g/dL (ref 30.0–36.0)
MCV: 100 fL (ref 80.0–100.0)
Monocytes Absolute: 0.5 10*3/uL (ref 0.1–1.0)
Monocytes Relative: 7 %
Neutro Abs: 4.7 10*3/uL (ref 1.7–7.7)
Neutrophils Relative %: 62 %
Platelets: 314 10*3/uL (ref 150–400)
RBC: 2.37 MIL/uL — ABNORMAL LOW (ref 3.87–5.11)
RDW: 17.4 % — ABNORMAL HIGH (ref 11.5–15.5)
WBC: 7.6 10*3/uL (ref 4.0–10.5)
nRBC: 0 % (ref 0.0–0.2)

## 2021-08-25 LAB — COMPREHENSIVE METABOLIC PANEL
ALT: 9 U/L (ref 0–44)
AST: 12 U/L — ABNORMAL LOW (ref 15–41)
Albumin: 2.1 g/dL — ABNORMAL LOW (ref 3.5–5.0)
Alkaline Phosphatase: 62 U/L (ref 38–126)
Anion gap: 6 (ref 5–15)
BUN: 18 mg/dL (ref 6–20)
CO2: 28 mmol/L (ref 22–32)
Calcium: 8.1 mg/dL — ABNORMAL LOW (ref 8.9–10.3)
Chloride: 105 mmol/L (ref 98–111)
Creatinine, Ser: 2.91 mg/dL — ABNORMAL HIGH (ref 0.44–1.00)
GFR, Estimated: 19 mL/min — ABNORMAL LOW (ref >60)
Glucose, Bld: 89 mg/dL (ref 70–99)
Potassium: 4.2 mmol/L (ref 3.5–5.1)
Sodium: 139 mmol/L (ref 135–145)
Total Bilirubin: 0.7 mg/dL (ref 0.3–1.2)
Total Protein: 6.4 g/dL — ABNORMAL LOW (ref 6.5–8.1)

## 2021-08-25 LAB — HIV ANTIBODY (ROUTINE TESTING W REFLEX): HIV Screen 4th Generation wRfx: NONREACTIVE

## 2021-08-25 LAB — MAGNESIUM: Magnesium: 1.9 mg/dL (ref 1.7–2.4)

## 2021-08-25 LAB — PHOSPHORUS: Phosphorus: 3.3 mg/dL (ref 2.5–4.6)

## 2021-08-25 MED ORDER — ACETAMINOPHEN 650 MG RE SUPP: 650.0000 mg | Freq: Four times a day (QID) | RECTAL | Status: DC | PRN

## 2021-08-25 MED ORDER — CLONIDINE HCL 0.1 MG PO TABS
0.1000 mg | ORAL_TABLET | Freq: Two times a day (BID) | ORAL | Status: DC
  Administered 2021-08-25 – 2021-08-26 (×5): 0.1 mg via ORAL
  Filled 2021-08-25 (×5): qty 1

## 2021-08-25 MED ORDER — DOCUSATE SODIUM 100 MG PO CAPS
100.0000 mg | ORAL_CAPSULE | Freq: Every day | ORAL | Status: DC
  Administered 2021-08-25: 100 mg via ORAL
  Filled 2021-08-25 (×2): qty 1

## 2021-08-25 MED ORDER — VANCOMYCIN VARIABLE DOSE PER UNSTABLE RENAL FUNCTION (PHARMACIST DOSING): Status: DC

## 2021-08-25 MED ORDER — IOHEXOL 9 MG/ML PO SOLN
ORAL | Status: AC
  Administered 2021-08-25: 500 mL via ORAL
  Filled 2021-08-25: qty 1000

## 2021-08-25 MED ORDER — LEVETIRACETAM 500 MG PO TABS
1000.0000 mg | ORAL_TABLET | Freq: Every day | ORAL | Status: DC
Start: 2021-08-25 — End: 2021-08-27
  Administered 2021-08-25 – 2021-08-26 (×2): 1000 mg via ORAL
  Filled 2021-08-25 (×2): qty 2

## 2021-08-25 MED ORDER — AMLODIPINE BESYLATE 5 MG PO TABS: 5.0000 mg | ORAL_TABLET | ORAL | Status: DC

## 2021-08-25 MED ORDER — FUROSEMIDE 20 MG PO TABS
20.0000 mg | ORAL_TABLET | Freq: Every day | ORAL | Status: DC
  Administered 2021-08-25 – 2021-08-26 (×2): 20 mg via ORAL
  Filled 2021-08-25 (×2): qty 1

## 2021-08-25 MED ORDER — CHLORHEXIDINE GLUCONATE CLOTH 2 % EX PADS
6.0000 | MEDICATED_PAD | Freq: Every day | CUTANEOUS | Status: DC
  Administered 2021-08-25 – 2021-08-26 (×2): 6 via TOPICAL

## 2021-08-25 MED ORDER — IOHEXOL 300 MG/ML  SOLN
100.0000 mL | Freq: Once | INTRAMUSCULAR | Status: AC | PRN
  Administered 2021-08-25: 100 mL via INTRAVENOUS

## 2021-08-25 MED ORDER — IOHEXOL 9 MG/ML PO SOLN
500.0000 mL | ORAL | Status: AC
  Administered 2021-08-25: 500 mL via ORAL

## 2021-08-25 MED ORDER — PANTOPRAZOLE SODIUM 40 MG PO TBEC
40.0000 mg | DELAYED_RELEASE_TABLET | Freq: Every day | ORAL | Status: DC
  Administered 2021-08-25 – 2021-08-26 (×2): 40 mg via ORAL
  Filled 2021-08-25 (×2): qty 1

## 2021-08-25 MED ORDER — OXYCODONE HCL 5 MG PO TABS: 5.0000 mg | ORAL_TABLET | ORAL | Status: DC | PRN

## 2021-08-25 MED ORDER — HYDROXYZINE HCL 25 MG PO TABS
25.0000 mg | ORAL_TABLET | Freq: Every day | ORAL | Status: DC
Start: 2021-08-25 — End: 2021-08-27
  Administered 2021-08-25 – 2021-08-26 (×2): 25 mg via ORAL
  Filled 2021-08-25 (×2): qty 1

## 2021-08-25 MED ORDER — FESOTERODINE FUMARATE ER 4 MG PO TB24
8.0000 mg | ORAL_TABLET | Freq: Every day | ORAL | Status: DC
  Administered 2021-08-25 – 2021-08-26 (×2): 8 mg via ORAL
  Filled 2021-08-25 (×3): qty 2

## 2021-08-25 MED ORDER — SEVELAMER CARBONATE 800 MG PO TABS
800.0000 mg | ORAL_TABLET | Freq: Three times a day (TID) | ORAL | Status: DC
  Administered 2021-08-25 – 2021-08-26 (×4): 800 mg via ORAL
  Filled 2021-08-25 (×4): qty 1

## 2021-08-25 MED ORDER — ONDANSETRON HCL 4 MG PO TABS: 4.0000 mg | ORAL_TABLET | Freq: Four times a day (QID) | ORAL | Status: DC | PRN

## 2021-08-25 MED ORDER — ACETAMINOPHEN 325 MG PO TABS: 650.0000 mg | ORAL_TABLET | Freq: Four times a day (QID) | ORAL | Status: DC | PRN

## 2021-08-25 MED ORDER — AMLODIPINE BESYLATE 5 MG PO TABS
5.0000 mg | ORAL_TABLET | ORAL | Status: DC
Start: 2021-08-27 — End: 2021-08-27

## 2021-08-25 MED ORDER — CARVEDILOL 3.125 MG PO TABS
6.2500 mg | ORAL_TABLET | Freq: Two times a day (BID) | ORAL | Status: DC
  Administered 2021-08-25 – 2021-08-26 (×4): 6.25 mg via ORAL
  Filled 2021-08-25 (×4): qty 2

## 2021-08-25 MED ORDER — ONDANSETRON HCL 4 MG/2ML IJ SOLN: 4.0000 mg | Freq: Four times a day (QID) | INTRAMUSCULAR | Status: DC | PRN

## 2021-08-25 MED ORDER — AMLODIPINE BESYLATE 5 MG PO TABS
10.0000 mg | ORAL_TABLET | ORAL | Status: DC
Start: 2021-08-25 — End: 2021-08-27
  Administered 2021-08-25 – 2021-08-26 (×2): 10 mg via ORAL
  Filled 2021-08-25 (×2): qty 2

## 2021-08-25 NOTE — Assessment & Plan Note (Signed)
Seizure precautions ?Continue Keppra ?

## 2021-08-25 NOTE — H&P (Signed)
?History and Physical  ? ? ?Patient: [name] WUJ:[id number] DOB: 06-26-1965 ?DOA: 08/24/2021 ?DOS: the patient was seen and examined on 08/25/2021 ?PCP: System, Provider Not In  ?Patient coming from: Home ? ?Chief Complaint:  ?Chief Complaint  ?Patient presents with  ? Abscess  ?  Rt groin ?  ? ?HPI: [name] is a 57 y.o. female with medical history significant of with history of hypertension, paraplegia, seizure disorder, chronic urinary catheter, abnormal uterine bleeding, and more presents the ED with a chief complaint of wound on the right buttock.  Patient reports this wound has been present for about 1 month.  Today had purulent drainage and some bleeding.  Patient reports bleeding only started today, it was not bleeding previously, and there were no gushes of blood.  Last week she did have subjective fevers and chills.  She took 2 Tylenol for those and the symptoms went away.  Patient reports no pain associated with this wound as she has no sensation below her waist.  She has had a normal appetite.  Patient thinks she has been on vancomycin for about 1-2 weeks.  It is given on dialysis days.  Patient receives dialysis Monday Wednesday Friday.  Her last vancomycin dose was on 31 March.  Patient reports that she called wound care and told them that the wound was draining and they were told to come to the ER for transfer to [facility].  Patient understands at [facility] does not have beds at this time. ? ?Patient does not smoke, she drinks liquor on the weekends, she does not use illicit drugs.  She is vaccinated for COVID.  Patient is full code. ?Review of Systems: As mentioned in the history of present illness. All other systems reviewed and are negative. ?Past Medical History:  ?Diagnosis Date  ? Abnormal uterine bleeding (AUB) 06/15/2014  ? Cancer [facility])   ? uterine  ? High blood pressure   ? Paraplegia ([place])   ? Seizure disorder ([place])   ? Seizures ([place])   ? Suprapubic catheter ([place])   ? Urinary tract  infection   ? ?Past Surgical History:  ?Procedure Laterality Date  ? BACK SURGERY    ? Pt stated "before 2000"  ? ESOPHAGOGASTRODUODENOSCOPY N/A 09/20/2015  ? Procedure: ESOPHAGOGASTRODUODENOSCOPY (EGD);  Surgeon: Rogene Houston, MD;  Location: [facility] ENDO SUITE;  Service: Endoscopy;  Laterality: N/A;  730  ? IR CATHETER TUBE CHANGE  04/02/2018  ? PERCUTANEOUS ENDOSCOPIC GASTROSTOMY (PEG) REMOVAL N/A 09/20/2015  ? Procedure: PERCUTANEOUS ENDOSCOPIC GASTROSTOMY (PEG) REMOVAL;  Surgeon: Rogene Houston, MD;  Location: [facility] ENDO SUITE;  Service: Endoscopy;  Laterality: N/A;  ? ?Social History:  reports that she has never smoked. She has never used smokeless tobacco. She reports current alcohol use. She reports that she does not use drugs. ? ?Allergies  ?Allergen Reactions  ? Benadryl [Diphenhydramine Hcl (Sleep)] Hives  ? Quinine Derivatives Other (See Comments)  ?  Alters mental status  ? Vancomycin   ? Azithromycin Itching and Rash  ? Tetracycline Itching  ?  Able to tolerate Doxycycline.   ? Zosyn [Piperacillin Sod-Tazobactam So] Rash  ? ? ?Family History  ?Problem Relation Age of Onset  ? Cancer Mother   ? Hypertension Mother   ? Cancer Sister   ?     breast and then spread everywhere.  ? Diabetes Paternal Grandmother   ? Hypertension Paternal Grandmother   ? ? ?Prior to Admission medications   ?Medication Sig Start Date End Date Taking? Authorizing  Provider  ?amLODipine (NORVASC) 5 MG tablet Take 5 mg by mouth See admin instructions. Take 1 tablet on Monday,Wednesday and Friday then take 2 tablets on Tuesday,Thursday,Saturday and Sunday. 11/24/18  Yes [provider]  ?B Complex-C-Folic Acid (RENA-VITE RX PO) Take 1 tablet by mouth daily.   Yes [provider]  ?carvedilol (COREG) 6.25 MG tablet Take 6.25 mg by mouth 2 (two) times daily with a meal.   Yes [provider]  ?cloNIDine (CATAPRES) 0.1 MG tablet Take 0.1 mg by mouth 2 (two) times daily.    Yes [provider]  ?docusate  sodium (COLACE) 100 MG capsule Take 100 mg by mouth at bedtime.   Yes [provider]  ?fesoterodine (TOVIAZ) 8 MG TB24 tablet Take 8 mg by mouth daily.    Yes [provider]  ?furosemide (LASIX) 20 MG tablet Take 20 mg by mouth daily. 08/14/21  Yes [provider]  ?levETIRAcetam (KEPPRA) 500 MG tablet Take 1,000 mg by mouth daily. 08/09/21  Yes [provider]  ?Multiple Vitamin (MULTIVITAMIN) tablet Take 1 tablet by mouth daily.   Yes [provider]  ?pantoprazole (PROTONIX) 40 MG tablet Take 40 mg by mouth daily.    Yes [provider]  ?sevelamer carbonate (RENVELA) 800 MG tablet Take 800 mg by mouth 3 (three) times daily with meals.   Yes [provider]  ?hydrOXYzine (ATARAX/VISTARIL) 25 MG tablet Take 25 mg by mouth daily.     [provider]  ? ? ?Physical Exam: ?Vitals:  ? 08/25/21 0000 08/25/21 0030 08/25/21 0100 08/25/21 0130  ?BP: (!) 143/72 (!) 143/71 137/66 (!) 146/70  ?Pulse: 81 81 80 87  ?Resp: (!) 21 (!) 21 (!) 25 (!) 21  ?Temp:   98.5 ?F (36.9 ?C)   ?TempSrc:      ?SpO2: 94% 93% 97% 94%  ?Weight:      ?Height:      ? ?1.  General: ?Patient lying supine in bed,  no acute distress ?  ?2. Psychiatric: ?Alert and oriented x 3, mood and behavior normal for situation, pleasant and cooperative with exam ?  ?3. Neurologic: ?Speech and language are normal, face is symmetric, moves all to extremities voluntarily, the lower 2 extremities spasm involuntarily, at baseline without acute deficits on limited exam ?  ?4. HEENMT:  ?Head is atraumatic, normocephalic, pupils reactive to light, neck is supple, trachea is midline, mucous membranes are moist ?  ?5. Respiratory : ?Lungs are clear to auscultation bilaterally without wheezing, rhonchi, rales, no cyanosis, no increase in work of breathing or accessory muscle use ?  ?6. Cardiovascular : ?Heart rate normal, rhythm is regular, no murmurs, rubs or gallops, no peripheral edema, peripheral  pulses palpated ?  ?7. Gastrointestinal:  ?Abdomen is soft, nondistended, nontender to palpation bowel sounds active, no masses or organomegaly palpated ?  ?8. Skin:  ?Large Abscess on right gluteal region ?  ?9.Musculoskeletal:  ?No acute deformities or trauma, no asymmetry in tone, no peripheral edema, peripheral pulses palpated, no tenderness to palpation in the extremities ? ?Data Reviewed: ?In the ED ?Temp 98.2, heart rate 84-90, respiratory rate 18-23, blood pressure 144/72, satting 95% ?No leukocytosis, hemoglobin 6.2 ?Chemistry panel reveals a creatinine that is better than previous creatinines in our system at 2.04 ?Elevated ferritin 1338 ?Decreased TIBC 152 ?B12 is elevated at 1113 ?Patient's MCV is 102.4 ?COVID-negative ?Blood culture pending, urine culture pending ?Vancomycin was given with dialysis today, was not given in our ED  ?  Patient likely will be ready for discharge tomorrow if the hemoglobin stays stable after transfusion ? ?Assessment and Plan: ?* Anemia ?Patient has a history of anemia secondary to ESRD ?Hemoglobin December 2022 was 11.5 ?Today hemoglobin was 6.2 ?No melena, hematochezia ?Occult blood test negative ?No bleeding from gums, epistaxis, or large bruising ?Possibly bleeding from wound on buttock ?1 unit transfused in ED ?Trend H&H 2 hours after transfusion ?Anemia panel ordered ?Continue to monitor ? ?Anxiety ?Continue Atarax ? ?GERD (gastroesophageal reflux disease) ?Continue Protonix ? ?Cellulitis and abscess of buttock ?Patient has been on vancomycin in the outpatient setting ?Significant purulent drainage today ?Wound is packed, consult wound care ?Continue vancomycin, consider ID consult ?Currently afebrile without leukocytosis ?Blood cultures pending ?Patient follows with [facility] wound care, but [facility] did not have any beds available today ?Continue to monitor ? ?Essential hypertension ?Continue amlodipine, Coreg, Catapres ? ?Seizure disorder ([place]) ?Seizure  precautions ?Continue Keppra ? ? ? ? ? Advance Care Planning:   Code Status: Prior full ? ?Consults: Nephrology for routine dialysis ? ?Family Communication: No family at bedside ? ?Severity of Illness: ?The appropriate pati

## 2021-08-25 NOTE — Assessment & Plan Note (Signed)
Continue amlodipine, Coreg, Catapres ?

## 2021-08-25 NOTE — Progress Notes (Signed)
?PROGRESS NOTE ? ? ? ?Elica Almas  OYD:[id number] DOB: 11-Apr-1966 DOA: 08/24/2021 ?PCP: System, Provider Not In ? ? ?Brief Narrative:  ?HPI: Emy Angevine is a 56 y.o. female with medical history significant of with history of hypertension, paraplegia, seizure disorder, chronic urinary catheter, abnormal uterine bleeding, and more presents the ED with a chief complaint of wound on the right buttock.  Patient reports this wound has been present for about 1 month.  Today had purulent drainage and some bleeding.  Patient reports bleeding only started today, it was not bleeding previously, and there were no gushes of blood.  Last week she did have subjective fevers and chills.  She took 2 Tylenol for those and the symptoms went away.  Patient reports no pain associated with this wound as she has no sensation below her waist.  She has had a normal appetite.  Patient thinks she has been on vancomycin for about 1-2 weeks.  It is given on dialysis days.  Patient receives dialysis Monday Wednesday Friday.  Her last vancomycin dose was on 31 March.  Patient reports that she called wound care and told them that the wound was draining and they were told to come to the ER for transfer to [facility].  Patient understands at [facility] does not have beds at this time. ?  ?Patient does not smoke, she drinks liquor on the weekends, she does not use illicit drugs.  She is vaccinated for COVID.  Patient is full code. ? ?Assessment & Plan: ?  ?Principal Problem: ?  Anemia ?Active Problems: ?  Seizure disorder ([place]) ?  Essential hypertension ?  Abscess of buttock, right ?  GERD (gastroesophageal reflux disease) ?  Anxiety ?  Pressure injury of skin ? ?Acute blood loss anemia: Likely blood loss from her wound and abscess.  FOBT negative.  Patient presented with hemoglobin of 6.2, received 1 unit of PRBC transfusion, currently on globin 7.4.  Monitor daily and transfuse if drops less than 7. ?  ?Cellulitis and abscess of right groin area:  When I examined her this morning, she had high drainage from her abscess and had overlying cellulitis as well.  Per patient, she has this wound for couple of months for which she gets vancomycin with hemodialysis and sees a wound physician with [facility] but the drainage is new.  She denies any fever, chills or sweating.  There was no bed available at [facility] yesterday.  I consulted general surgery and she was seen by Dr. Arnoldo Morale.  Per his note, there are no plans and he was waiting for CT scan results.  Results are back and I have sent him a message on the secure chat to review and formulate further plan.  Continue vancomycin. ?  ?Essential hypertension: Blood pressure controlled. ?Continue amlodipine, Coreg, Catapres ?  ?Seizure disorder ([place]) ?Seizure precautions ?Continue Keppra ? ?ESRD on HD: We will consult nephrology. ?  ?DVT prophylaxis: SCDs Start: 08/25/21 0215 ?  Code Status: Full Code  ?Family Communication:  None present at bedside.  Plan of care discussed with patient in length and he/she verbalized understanding and agreed with it. ? ?Status is: Observation ?The patient will require care spanning > 2 midnights and should be moved to inpatient because: Needs IV antibiotics and perhaps further debridement of the abscess. ? ? ?Estimated body mass index is 22.55 kg/m? as calculated from the following: ?  Height as of this encounter: '5\' 3"'$  (1.6 m). ?  Weight as of this encounter:  57.7 kg. ? ?Pressure Ulcer 05/03/14 Stage II -  Partial thickness loss of dermis presenting as a shallow open ulcer with a red, pink wound bed without slough. (Active)  ?05/03/14 0100  ?Location: Buttocks  ?Location Orientation:   ?Staging: Stage II -  Partial thickness loss of dermis presenting as a shallow open ulcer with a red, pink wound bed without slough.  ?Wound Description (Comments):   ?Present on Admission:   ?   ?Pressure Ulcer 05/03/14 (Active)  ?05/03/14 0100  ?Location: Buttocks  ?Location Orientation:  Right  ?Staging:   ?Wound Description (Comments):   ?Present on Admission:   ?   ?Pressure Ulcer 08/02/14 Stage III -  Full thickness tissue loss. Subcutaneous fat may be visible but bone, tendon or muscle are NOT exposed. Wound has white and yellow wound bed and measures 2cm by 2cm.  Cleaned and dressing applied. (Active)  ?08/02/14 1654  ?Location: Buttocks  ?Location Orientation: Left  ?Staging: Stage III -  Full thickness tissue loss. Subcutaneous fat may be visible but bone, tendon or muscle are NOT exposed.  ?Wound Description (Comments): Wound has white and yellow wound bed and measures 2cm by 2cm.  Cleaned and dressing applied.  ?Present on Admission: Yes  ?   ?Pressure Ulcer 08/26/14 Stage II -  Partial thickness loss of dermis presenting as a shallow open ulcer with a red, pink wound bed without slough. (Active)  ?08/26/14 1940  ?Location: Buttocks  ?Location Orientation: Left  ?Staging: Stage II -  Partial thickness loss of dermis presenting as a shallow open ulcer with a red, pink wound bed without slough.  ?Wound Description (Comments):   ?Present on Admission: Yes  ?   ?Pressure Ulcer 08/26/14 Stage II -  Partial thickness loss of dermis presenting as a shallow open ulcer with a red, pink wound bed without slough. (Active)  ?08/26/14 1940  ?Location: Thigh  ?Location Orientation: Left  ?Staging: Stage II -  Partial thickness loss of dermis presenting as a shallow open ulcer with a red, pink wound bed without slough.  ?Wound Description (Comments):   ?Present on Admission: Yes  ?   ?Pressure Ulcer 10/10/14 Stage II -  Partial thickness loss of dermis presenting as a shallow open ulcer with a red, pink wound bed without slough. 1cmX0.5cm (Active)  ?10/10/14 0207  ?Location: Buttocks  ?Location Orientation: Right  ?Staging: Stage II -  Partial thickness loss of dermis presenting as a shallow open ulcer with a red, pink wound bed without slough.  ?Wound Description (Comments): 1cmX0.5cm  ?Present on  Admission: Yes  ?   ?Pressure Ulcer 10/10/14 Stage II -  Partial thickness loss of dermis presenting as a shallow open ulcer with a red, pink wound bed without slough. 1cmX2cm (Active)  ?10/10/14 0207  ?Location: Buttocks  ?Location Orientation: Left  ?Staging: Stage II -  Partial thickness loss of dermis presenting as a shallow open ulcer with a red, pink wound bed without slough.  ?Wound Description (Comments): 1cmX2cm  ?Present on Admission: Yes  ?   ?Pressure Ulcer 10/10/14 Unstageable - Full thickness tissue loss in which the base of the ulcer is covered by slough (yellow, tan, gray, green or brown) and/or eschar (tan, brown or black) in the wound bed. scab 2.5cmX3cm (Active)  ?10/10/14 0207  ?Location: Heel  ?Location Orientation: Right  ?Staging: Unstageable - Full thickness tissue loss in which the base of the ulcer is covered by slough (yellow, tan, gray, green or brown) and/or eschar (tan,  brown or black) in the wound bed.  ?Wound Description (Comments): scab 2.5cmX3cm  ?Present on Admission: Yes  ?   ?Pressure Ulcer 05/15/15 Deep Tissue Injury - Purple or maroon localized area of discolored intact skin or blood-filled blister due to damage of underlying soft tissue from pressure and/or shear. (Active)  ?05/15/15 1700  ?Location: Heel  ?Location Orientation: Left  ?Staging: Deep Tissue Injury - Purple or maroon localized area of discolored intact skin or blood-filled blister due to damage of underlying soft tissue from pressure and/or shear.  ?Wound Description (Comments):   ?Present on Admission: Yes  ?   ?Pressure Ulcer 05/15/15 Stage III -  Full thickness tissue loss. Subcutaneous fat may be visible but bone, tendon or muscle are NOT exposed. 2x1 with 2.5cm tunneling  (Active)  ?05/15/15 1700  ?Location: Buttocks  ?Location Orientation: Right  ?Staging: Stage III -  Full thickness tissue loss. Subcutaneous fat may be visible but bone, tendon or muscle are NOT exposed.  ?Wound Description (Comments): 2x1  with 2.5cm tunneling   ?Present on Admission: Yes  ?   ?Pressure Ulcer 05/15/15 Stage III -  Full thickness tissue loss. Subcutaneous fat may be visible but bone, tendon or muscle are NOT exposed. 3x4 with

## 2021-08-25 NOTE — Assessment & Plan Note (Signed)
Patient has a history of anemia secondary to ESRD ?Hemoglobin December 2022 was 11.5 ?Today hemoglobin was 6.2 ?No melena, hematochezia ?Occult blood test negative ?No bleeding from gums, epistaxis, or large bruising ?Possibly bleeding from wound on buttock ?1 unit transfused in ED ?Trend H&H 2 hours after transfusion ?Anemia panel ordered ?Continue to monitor ?

## 2021-08-25 NOTE — Assessment & Plan Note (Signed)
Patient has been on vancomycin in the outpatient setting ?Significant purulent drainage today ?Wound is packed, consult wound care ?Continue vancomycin, consider ID consult ?Currently afebrile without leukocytosis ?Blood cultures pending ?Patient follows with [facility] wound care, but [facility] did not have any beds available today ?Continue to monitor ?

## 2021-08-25 NOTE — Consult Note (Signed)
Reason for Consult: Right buttock/thigh abscess ?Referring Physician: Dr. Doristine Bosworth ? ?[name] is an 56 y.o. female.  ?HPI: Patient is a 56 year old paraplegic Hispanic female with multiple medical problems who has been followed by the wound care center at [facility] for chronic pressure wounds.  They have been following her for over 2 years.  She presented to [facility] due to worsening drainage from a wound along the right lower buttock, posterior upper thigh.  Patient was seen in the emergency room and the wound was packed. ? ?Past Medical History:  ?Diagnosis Date  ? Abnormal uterine bleeding (AUB) 06/15/2014  ? Cancer [facility])   ? uterine  ? High blood pressure   ? Paraplegia (lower)   ? Seizure disorder ([place])   ? Seizures ([place])   ? Suprapubic catheter ([place])   ? Urinary tract infection   ? ? ?Past Surgical History:  ?Procedure Laterality Date  ? BACK SURGERY    ? Pt stated "before 2000"  ? ESOPHAGOGASTRODUODENOSCOPY N/A 09/20/2015  ? Procedure: ESOPHAGOGASTRODUODENOSCOPY (EGD);  Surgeon: Rogene Houston, MD;  Location: [facility] ENDO [facility];  Service: Endoscopy;  Laterality: N/A;  730  ? IR CATHETER TUBE CHANGE  04/02/2018  ? PERCUTANEOUS ENDOSCOPIC GASTROSTOMY (PEG) REMOVAL N/A 09/20/2015  ? Procedure: PERCUTANEOUS ENDOSCOPIC GASTROSTOMY (PEG) REMOVAL;  Surgeon: Rogene Houston, MD;  Location: [facility] ENDO [facility];  Service: Endoscopy;  Laterality: N/A;  ? ? ?Family History  ?Problem Relation Age of Onset  ? Cancer Mother   ? Hypertension Mother   ? Cancer Sister   ?     breast and then spread everywhere.  ? Diabetes Paternal Grandmother   ? Hypertension Paternal Grandmother   ? ? ?Social History:  reports that she has never smoked. She has never used smokeless tobacco. She reports current alcohol use. She reports that she does not use drugs. ? ?Allergies:  ?Allergies  ?Allergen Reactions  ? Benadryl [Diphenhydramine Hcl (Sleep)] Hives  ? Quinine Derivatives Other (See Comments)  ?  Alters  mental status  ? Vancomycin   ?  Pt is tolerating this medication at HD  ? Azithromycin Itching and Rash  ? Tetracycline Itching  ?  Able to tolerate Doxycycline.   ? Zosyn [Piperacillin Sod-Tazobactam So] Rash  ? ? ?Medications: I have reviewed the patient's current medications. ? ?Results for orders placed or performed during the hospital encounter of 08/24/21 (from the past 48 hour(s))  ?Basic metabolic panel     Status: Abnormal  ? Collection Time: 08/24/21  5:35 PM  ?Result Value Ref Range  ? Sodium 142 135 - 145 mmol/L  ? Potassium 3.5 3.5 - 5.1 mmol/L  ? Chloride 107 98 - 111 mmol/L  ? CO2 31 22 - 32 mmol/L  ? Glucose, Bld 91 70 - 99 mg/dL  ?  Comment: Glucose reference range applies only to samples taken after fasting for at least 8 hours.  ? BUN 10 6 - 20 mg/dL  ? Creatinine, Ser 2.04 (H) 0.44 - 1.00 mg/dL  ? Calcium 8.1 (L) 8.9 - 10.3 mg/dL  ? GFR, Estimated 29 (L) >60 mL/min  ?  Comment: (NOTE) ?Calculated using the CKD-EPI Creatinine Equation (2021) ?  ? Anion gap 4 (L) 5 - 15  ?  Comment: Performed at [facility], [address], [place], [place] [address]  ?CBC with Differential     Status: Abnormal  ? Collection Time: 08/24/21  5:35 PM  ?Result Value Ref Range  ?  WBC 9.3 4.0 - 10.5 K/uL  ? RBC 2.12 (L) 3.87 - 5.11 MIL/uL  ? Hemoglobin 6.2 (LL) 12.0 - 15.0 g/dL  ?  Comment: REPEATED TO VERIFY ?THIS CRITICAL RESULT HAS VERIFIED AND BEEN CALLED TO [place] BY [place] ON 03 31 2023 AT 1857, AND HAS BEEN READ BACK.  ?  ? HCT 21.7 (L) 36.0 - 46.0 %  ? MCV 102.4 (H) 80.0 - 100.0 fL  ? MCH 29.2 26.0 - 34.0 pg  ? MCHC 28.6 (L) 30.0 - 36.0 g/dL  ? RDW 16.5 (H) 11.5 - 15.5 %  ? Platelets 336 150 - 400 K/uL  ? nRBC 0.0 0.0 - 0.2 %  ? Neutrophils Relative % 76 %  ? Neutro Abs 7.1 1.7 - 7.7 K/uL  ? Lymphocytes Relative 13 %  ? Lymphs Abs 1.3 0.7 - 4.0 K/uL  ? Monocytes Relative 6 %  ? Monocytes Absolute 0.6 0.1 - 1.0 K/uL  ? Eosinophils Relative 3 %  ? Eosinophils Absolute 0.3 0.0 - 0.5 K/uL  ? Basophils  Relative 1 %  ? Basophils Absolute 0.1 0.0 - 0.1 K/uL  ? Immature Granulocytes 1 %  ? Abs Immature Granulocytes 0.06 0.00 - 0.07 K/uL  ?  Comment: Performed at [facility], [address] [place], [place] [address]  ?Lactic acid, plasma     Status: None  ? Collection Time: 08/24/21  5:35 PM  ?Result Value Ref Range  ? Lactic Acid, Venous 0.8 0.5 - 1.9 mmol/L  ?  Comment: Performed at [facility], [address], [place], [place] [address]  ?Blood culture (routine x 2)     Status: None (Preliminary result)  ? Collection Time: 08/24/21  5:40 PM  ? Specimen: BLOOD LEFT HAND  ?Result Value Ref Range  ? Specimen Description    ?  BLOOD LEFT HAND BOTTLES DRAWN AEROBIC AND ANAEROBIC  ? Special Requests    ?  Blood Culture results may not be optimal due to an excessive volume of blood received in culture bottles  ? Culture    ?  NO GROWTH < 24 HOURS ?Performed at [facility], [address] [place], [place] [address] ?  ? Report Status PENDING   ?Blood culture (routine x 2)     Status: None (Preliminary result)  ? Collection Time: 08/24/21  5:40 PM  ? Specimen: BLOOD RIGHT ARM  ?Result Value Ref Range  ? Specimen Description    ?  BLOOD RIGHT ARM BOTTLES DRAWN AEROBIC AND ANAEROBIC  ? Special Requests Blood Culture adequate volume   ? Culture    ?  NO GROWTH < 24 HOURS ?Performed at [facility], [address], [place], [place] [address] ?  ? Report Status PENDING   ?Type and screen [facility]     Status: None (Preliminary result)  ? Collection Time: 08/24/21  7:27 PM  ?Result Value Ref Range  ? ABO/RH(D) A POS   ? Antibody Screen NEG   ? Sample Expiration 08/27/2021,2359   ? Unit Number [id number]   ? Blood Component Type RED CELLS,LR   ? Unit division 00   ? Status of Unit ISSUED   ? Transfusion Status OK TO TRANSFUSE   ? Crossmatch Result    ?  Compatible ?Performed at [facility], [address] [place],  [address] ?  ?Prepare RBC (crossmatch)     Status: None  ? Collection Time: 08/24/21  7:27 PM   ?Result Value Ref Range  ?  Order Confirmation    ?  ORDER PROCESSED BY BLOOD BANK ?Performed at [facility], [address] [place], [place] [address] ?  ?Vitamin B12     Status: Abnormal  ? Collection Time: 08/24/21  7:28 PM  ?Result Value Ref Range  ? Vitamin B-12 1,113 (H) 180 - 914 pg/mL  ?  Comment: (NOTE) ?This assay is not validated for testing neonatal or ?myeloproliferative syndrome specimens for Vitamin B12 levels. ?Performed at [facility], [address] [place], [place] [address] ?  ?Folate     Status: None  ? Collection Time: 08/24/21  7:28 PM  ?Result Value Ref Range  ? Folate 41.5 >5.9 ng/mL  ?  Comment: RESULTS CONFIRMED BY MANUAL DILUTION ?Performed at [facility], [address], [place], [place] [address] ?  ?Iron and TIBC     Status: Abnormal  ? Collection Time: 08/24/21  7:28 PM  ?Result Value Ref Range  ? Iron 47 28 - 170 ug/dL  ? TIBC 152 (L) 250 - 450 ug/dL  ? Saturation Ratios 31 10.4 - 31.8 %  ? UIBC 105 ug/dL  ?  Comment: Performed at [facility], [address] [place], [place] [address]  ?Ferritin     Status: Abnormal  ? Collection Time: 08/24/21  7:28 PM  ?Result Value Ref Range  ? Ferritin 1,338 (H) 11 - 307 ng/mL  ?  Comment: Performed at [facility], [address], [place], [place] [address]  ?Reticulocytes     Status: Abnormal  ? Collection Time: 08/24/21  7:28 PM  ?Result Value Ref Range  ? Retic Ct Pct 2.4 0.4 - 3.1 %  ? RBC. 2.36 (L) 3.87 - 5.11 MIL/uL  ? Retic Count, Absolute 55.7 19.0 - 186.0 K/uL  ? Immature Retic Fract 18.9 (H) 2.3 - 15.9 %  ?  Comment: Performed at [facility], [address] [place], [place] [address]  ?Lactic acid, plasma     Status: None  ? Collection Time: 08/24/21  7:29 PM  ?Result Value Ref Range  ? Lactic Acid, Venous 1.1 0.5 - 1.9 mmol/L  ?  Comment: Performed at [facility], [address] [place],  [address]  ?Resp Panel by RT-PCR (Flu A&B, Covid) Nasopharyngeal Swab     Status: None  ? Collection Time: 08/24/21  8:59 PM  ? Specimen:  Nasopharyngeal Swab; Nasopharyngeal(NP) swabs in vial transport medium  ?Result Value Ref Range  ? SARS Coronavirus 2 by RT PCR NEGATIVE NEGATIVE  ?  Comment: (NOTE) ?SARS-CoV-2 target nucleic acids are NO

## 2021-08-25 NOTE — Progress Notes (Signed)
Pharmacy Antibiotic Note ? ?[name] is a 56 y.o. female admitted on 08/24/2021 with R buttock cellulitis/abscess.  Pharmacy has been consulted for Vancomycin dosing. ? ?Pt reports receiving HD on M/W/F and thinks she has been on vancomycin for ~1 week. Last dose given 3/31 at HD. ? ?Noted that pt received 1gm Vanc in ED at [facility] - this seems to be an "extra" dose since pt wasn't dialyzed today. ? ?Plan: ?No scheduled Vanc for now ?Will f/u HD plans, likely need to get random vanc level pre-HD (may be high with extra Vanc given 3/31 in ED) ?F/u micro data and pt's clinical condition ? ?Height: '5\' 3"'$  (160 cm) ?Weight: 57.7 kg (127 lb 4.8 oz) ?IBW/kg (Calculated) : 52.4 ? ?Temp (24hrs), Avg:98.4 ?F (36.9 ?C), Min:98.2 ?F (36.8 ?C), Max:98.8 ?F (37.1 ?C) ? ?Recent Labs  ?Lab 08/24/21 ?1735 08/24/21 ?1929  ?WBC 9.3  --   ?CREATININE 2.04*  --   ?LATICACIDVEN 0.8 1.1  ?  ?Estimated Creatinine Clearance: 26.4 mL/min (A) (by C-G formula based on SCr of 2.04 mg/dL (H)).   ? ?Allergies  ?Allergen Reactions  ? Benadryl [Diphenhydramine Hcl (Sleep)] Hives  ? Quinine Derivatives Other (See Comments)  ?  Alters mental status  ? Vancomycin   ? Azithromycin Itching and Rash  ? Tetracycline Itching  ?  Able to tolerate Doxycycline.   ? Zosyn [Piperacillin Sod-Tazobactam So] Rash  ? ? ?Antimicrobials this admission: ?3/31 Vanc (this was also PTA -unsure exact start date) >>  ? ?Microbiology results: ?3/31 BCx:  ? ?Thank you for allowing pharmacy to be a part of this patient?s care. ? ?Sherlon Handing, PharmD, BCPS ?Please see amion for complete clinical pharmacist phone list ?08/25/2021 6:29 AM ? ?

## 2021-08-25 NOTE — Assessment & Plan Note (Signed)
Continue Protonix °

## 2021-08-25 NOTE — Assessment & Plan Note (Signed)
Continue Atarax ?

## 2021-08-26 DIAGNOSIS — I120 Hypertensive chronic kidney disease with stage 5 chronic kidney disease or end stage renal disease: Secondary | ICD-10-CM | POA: Diagnosis present

## 2021-08-26 DIAGNOSIS — N186 End stage renal disease: Secondary | ICD-10-CM | POA: Diagnosis present

## 2021-08-26 DIAGNOSIS — D62 Acute posthemorrhagic anemia: Secondary | ICD-10-CM | POA: Diagnosis present

## 2021-08-26 DIAGNOSIS — D631 Anemia in chronic kidney disease: Secondary | ICD-10-CM | POA: Diagnosis present

## 2021-08-26 DIAGNOSIS — Z79899 Other long term (current) drug therapy: Secondary | ICD-10-CM | POA: Diagnosis not present

## 2021-08-26 DIAGNOSIS — L0231 Cutaneous abscess of buttock: Secondary | ICD-10-CM | POA: Diagnosis present

## 2021-08-26 DIAGNOSIS — K219 Gastro-esophageal reflux disease without esophagitis: Secondary | ICD-10-CM | POA: Diagnosis present

## 2021-08-26 DIAGNOSIS — L03317 Cellulitis of buttock: Secondary | ICD-10-CM | POA: Diagnosis present

## 2021-08-26 DIAGNOSIS — L89153 Pressure ulcer of sacral region, stage 3: Secondary | ICD-10-CM | POA: Diagnosis present

## 2021-08-26 DIAGNOSIS — Z8542 Personal history of malignant neoplasm of other parts of uterus: Secondary | ICD-10-CM | POA: Diagnosis not present

## 2021-08-26 DIAGNOSIS — L89310 Pressure ulcer of right buttock, unstageable: Secondary | ICD-10-CM | POA: Diagnosis present

## 2021-08-26 DIAGNOSIS — Z992 Dependence on renal dialysis: Secondary | ICD-10-CM | POA: Diagnosis not present

## 2021-08-26 DIAGNOSIS — G8220 Paraplegia, unspecified: Secondary | ICD-10-CM | POA: Diagnosis present

## 2021-08-26 DIAGNOSIS — D649 Anemia, unspecified: Secondary | ICD-10-CM | POA: Diagnosis present

## 2021-08-26 DIAGNOSIS — G40909 Epilepsy, unspecified, not intractable, without status epilepticus: Secondary | ICD-10-CM | POA: Diagnosis present

## 2021-08-26 DIAGNOSIS — Z20822 Contact with and (suspected) exposure to covid-19: Secondary | ICD-10-CM | POA: Diagnosis present

## 2021-08-26 DIAGNOSIS — L02415 Cutaneous abscess of right lower limb: Secondary | ICD-10-CM | POA: Diagnosis present

## 2021-08-26 DIAGNOSIS — F419 Anxiety disorder, unspecified: Secondary | ICD-10-CM | POA: Diagnosis present

## 2021-08-26 DIAGNOSIS — Z888 Allergy status to other drugs, medicaments and biological substances status: Secondary | ICD-10-CM | POA: Diagnosis not present

## 2021-08-26 DIAGNOSIS — L89320 Pressure ulcer of left buttock, unstageable: Secondary | ICD-10-CM | POA: Diagnosis present

## 2021-08-26 DIAGNOSIS — Z883 Allergy status to other anti-infective agents status: Secondary | ICD-10-CM | POA: Diagnosis not present

## 2021-08-26 LAB — TYPE AND SCREEN
ABO/RH(D): A POS
Antibody Screen: NEGATIVE
Unit division: 0

## 2021-08-26 LAB — BPAM RBC
Blood Product Expiration Date: 202304172359
ISSUE DATE / TIME: 202303312103
Unit Type and Rh: 6200

## 2021-08-26 LAB — CBC WITH DIFFERENTIAL/PLATELET
Abs Immature Granulocytes: 0.05 10*3/uL (ref 0.00–0.07)
Basophils Absolute: 0.1 10*3/uL (ref 0.0–0.1)
Basophils Relative: 1 %
Eosinophils Absolute: 0.6 10*3/uL — ABNORMAL HIGH (ref 0.0–0.5)
Eosinophils Relative: 8 %
HCT: 24.5 % — ABNORMAL LOW (ref 36.0–46.0)
Hemoglobin: 7.2 g/dL — ABNORMAL LOW (ref 12.0–15.0)
Immature Granulocytes: 1 %
Lymphocytes Relative: 22 %
Lymphs Abs: 1.6 10*3/uL (ref 0.7–4.0)
MCH: 28.8 pg (ref 26.0–34.0)
MCHC: 29.4 g/dL — ABNORMAL LOW (ref 30.0–36.0)
MCV: 98 fL (ref 80.0–100.0)
Monocytes Absolute: 0.5 10*3/uL (ref 0.1–1.0)
Monocytes Relative: 7 %
Neutro Abs: 4.4 10*3/uL (ref 1.7–7.7)
Neutrophils Relative %: 61 %
Platelets: 298 10*3/uL (ref 150–400)
RBC: 2.5 MIL/uL — ABNORMAL LOW (ref 3.87–5.11)
RDW: 16.5 % — ABNORMAL HIGH (ref 11.5–15.5)
WBC: 7.2 10*3/uL (ref 4.0–10.5)
nRBC: 0 % (ref 0.0–0.2)

## 2021-08-26 NOTE — Progress Notes (Signed)
Patient left floor with [facility] in stable condition.  Patient left with belongings, a cell phone, light pink small bag, a striped medium sized bag (red and white) and a red pillow.   ?

## 2021-08-26 NOTE — Progress Notes (Signed)
Reviewed CT scan of the pelvis and upper extremity.  I did perform an I&D of the left posterior upper thigh at bedside as patient is paraplegic and has no sensation.  My finger was used to break up any loculations.  The wound was then packed with normal saline gauze.  Wound care orders written. ?

## 2021-08-26 NOTE — Progress Notes (Signed)
?PROGRESS NOTE ? ? ? ?[name]  UMP:[id number] DOB: [birth year] DOA: 08/24/2021 ?PCP: System, Provider Not In ? ? ?Brief Narrative:  ?HPI: [name] is a 56 y.o. female with medical history significant of with history of hypertension, paraplegia, seizure disorder, chronic urinary catheter, abnormal uterine bleeding, and more presents the ED with a chief complaint of wound on the right buttock.  Patient reports this wound has been present for about 1 month.  Today had purulent drainage and some bleeding.  Patient reports bleeding only started today, it was not bleeding previously, and there were no gushes of blood.  Last week she did have subjective fevers and chills.  She took 2 Tylenol for those and the symptoms went away.  Patient reports no pain associated with this wound as she has no sensation below her waist.  She has had a normal appetite.  Patient thinks she has been on vancomycin for about 1-2 weeks.  It is given on dialysis days.  Patient receives dialysis Monday Wednesday Friday.  Her last vancomycin dose was on 31 March.  Patient reports that she called wound care and told them that the wound was draining and they were told to come to the ER for transfer to [facility].  Patient understands at [facility] does not have beds at this time. ?  ?Patient does not smoke, she drinks liquor on the weekends, she does not use illicit drugs.  She is vaccinated for COVID.  Patient is full code. ? ?Assessment & Plan: ?  ?Principal Problem: ?  Anemia ?Active Problems: ?  Seizure disorder ([place]) ?  Essential hypertension ?  Abscess of buttock, right ?  GERD (gastroesophageal reflux disease) ?  Anxiety ?  Pressure injury of skin ? ?Acute blood loss anemia: Likely blood loss from her wound and abscess.  FOBT negative.  Patient presented with hemoglobin of 6.2, received 1 unit of PRBC transfusion, currently on globin 7.2  Monitor daily and transfuse if drops less than 7. ?  ?Cellulitis and abscess of right groin area:  She underwent bedside incision and drainage with the general surgery by Dr. Arnoldo Morale today.  Per Dr. Arnoldo Morale, the wound looks good and there is no further plans for incision and drainage or anything else and that she can be discharged tomorrow after dialysis.  Patient's daughter will need to come and learn from the nurses or Dr. Arnoldo Morale how to do the dressing changes.  RN is going to make the call.  Per Dr. Arnoldo Morale, patient does not need to be transferred to [facility] anymore.  I did call [facility] this morning however trying to transfer her.  Still waiting for the call back. ?  ?Essential hypertension: Blood pressure controlled. ?Continue amlodipine, Coreg, Catapres ?  ?Seizure disorder ([place]) ?Seizure precautions ?Continue Keppra ? ?ESRD on HD: Nephrology consulted. ?  ?DVT prophylaxis: SCDs Start: 08/25/21 0215 ?  Code Status: Full Code  ?Family Communication:  None present at bedside.  Plan of care discussed with patient in length and he/she verbalized understanding and agreed with it. ? ?Status is: Observation ?The patient will require care spanning > 2 midnights and should be moved to inpatient because: Needs IV antibiotics and perhaps further debridement of the abscess. ? ? ?Estimated body mass index is 22.55 kg/m? as calculated from the following: ?  Height as of this encounter: '5\' 3"'$  (1.6 m). ?  Weight as of this encounter: 57.7 kg. ? ?Pressure Ulcer 05/03/14 Stage II -  Partial thickness loss of dermis  presenting as a shallow open ulcer with a red, pink wound bed without slough. (Active)  ?05/03/14 0100  ?Location: Buttocks  ?Location Orientation:   ?Staging: Stage II -  Partial thickness loss of dermis presenting as a shallow open ulcer with a red, pink wound bed without slough.  ?Wound Description (Comments):   ?Present on Admission:   ?   ?Pressure Ulcer 05/03/14 (Active)  ?05/03/14 0100  ?Location: Buttocks  ?Location Orientation: Right  ?Staging:   ?Wound Description (Comments):   ?Present on  Admission:   ?   ?Pressure Ulcer 08/02/14 Stage III -  Full thickness tissue loss. Subcutaneous fat may be visible but bone, tendon or muscle are NOT exposed. Wound has white and yellow wound bed and measures 2cm by 2cm.  Cleaned and dressing applied. (Active)  ?08/02/14 1654  ?Location: Buttocks  ?Location Orientation: Left  ?Staging: Stage III -  Full thickness tissue loss. Subcutaneous fat may be visible but bone, tendon or muscle are NOT exposed.  ?Wound Description (Comments): Wound has white and yellow wound bed and measures 2cm by 2cm.  Cleaned and dressing applied.  ?Present on Admission: Yes  ?   ?Pressure Ulcer 08/26/14 Stage II -  Partial thickness loss of dermis presenting as a shallow open ulcer with a red, pink wound bed without slough. (Active)  ?08/26/14 1940  ?Location: Buttocks  ?Location Orientation: Left  ?Staging: Stage II -  Partial thickness loss of dermis presenting as a shallow open ulcer with a red, pink wound bed without slough.  ?Wound Description (Comments):   ?Present on Admission: Yes  ?   ?Pressure Ulcer 08/26/14 Stage II -  Partial thickness loss of dermis presenting as a shallow open ulcer with a red, pink wound bed without slough. (Active)  ?08/26/14 1940  ?Location: Thigh  ?Location Orientation: Left  ?Staging: Stage II -  Partial thickness loss of dermis presenting as a shallow open ulcer with a red, pink wound bed without slough.  ?Wound Description (Comments):   ?Present on Admission: Yes  ?   ?Pressure Ulcer 10/10/14 Stage II -  Partial thickness loss of dermis presenting as a shallow open ulcer with a red, pink wound bed without slough. 1cmX0.5cm (Active)  ?10/10/14 0207  ?Location: Buttocks  ?Location Orientation: Right  ?Staging: Stage II -  Partial thickness loss of dermis presenting as a shallow open ulcer with a red, pink wound bed without slough.  ?Wound Description (Comments): 1cmX0.5cm  ?Present on Admission: Yes  ?   ?Pressure Ulcer 10/10/14 Stage II -  Partial  thickness loss of dermis presenting as a shallow open ulcer with a red, pink wound bed without slough. 1cmX2cm (Active)  ?10/10/14 0207  ?Location: Buttocks  ?Location Orientation: Left  ?Staging: Stage II -  Partial thickness loss of dermis presenting as a shallow open ulcer with a red, pink wound bed without slough.  ?Wound Description (Comments): 1cmX2cm  ?Present on Admission: Yes  ?   ?Pressure Ulcer 10/10/14 Unstageable - Full thickness tissue loss in which the base of the ulcer is covered by slough (yellow, tan, gray, green or brown) and/or eschar (tan, brown or black) in the wound bed. scab 2.5cmX3cm (Active)  ?10/10/14 0207  ?Location: Heel  ?Location Orientation: Right  ?Staging: Unstageable - Full thickness tissue loss in which the base of the ulcer is covered by slough (yellow, tan, gray, green or brown) and/or eschar (tan, brown or black) in the wound bed.  ?Wound Description (Comments): scab 2.5cmX3cm  ?Present  on Admission: Yes  ?   ?Pressure Ulcer 05/15/15 Deep Tissue Injury - Purple or maroon localized area of discolored intact skin or blood-filled blister due to damage of underlying soft tissue from pressure and/or shear. (Active)  ?05/15/15 1700  ?Location: Heel  ?Location Orientation: Left  ?Staging: Deep Tissue Injury - Purple or maroon localized area of discolored intact skin or blood-filled blister due to damage of underlying soft tissue from pressure and/or shear.  ?Wound Description (Comments):   ?Present on Admission: Yes  ?   ?Pressure Ulcer 05/15/15 Stage III -  Full thickness tissue loss. Subcutaneous fat may be visible but bone, tendon or muscle are NOT exposed. 2x1 with 2.5cm tunneling  (Active)  ?05/15/15 1700  ?Location: Buttocks  ?Location Orientation: Right  ?Staging: Stage III -  Full thickness tissue loss. Subcutaneous fat may be visible but bone, tendon or muscle are NOT exposed.  ?Wound Description (Comments): 2x1 with 2.5cm tunneling   ?Present on Admission: Yes  ?   ?Pressure  Ulcer 05/15/15 Stage III -  Full thickness tissue loss. Subcutaneous fat may be visible but bone, tendon or muscle are NOT exposed. 3x4 with 3.5 cm tunneling  (Active)  ?05/15/15 1700  ?Location: Buttocks  ?[place]

## 2021-08-26 NOTE — Consult Note (Signed)
[place] Nurse Consult Note: ?[facility] is consulted simultaneously with Surgery and Dr. Arnoldo Morale has seen the patient. An I&D was performed at bedside and orders written for the care of the area. See his noted dated 08/25/21 and 08/26/21. ? ?There is no role for WOC nursing at this time. ?[place] did not see and will not follow. ? ?Thank you. ?Maudie Flakes, MSN, RN, [place], [place], CWON-AP, [place]  ?Pager# [phone]  ? ? ?  ?

## 2021-08-27 NOTE — Discharge Summary (Signed)
PatientPhysician Discharge Summary  ?[name] PNT:[id number] DOB: [birth year] DOA: 08/24/2021 ? ?PCP: System, Provider Not In ? ?Admit date: 08/24/2021 ?Discharge date: 08/27/2021 ?30 Day Unplanned Readmission Risk Score   ? ?Flowsheet Row ED to Hosp-Admission (Discharged) from 08/24/2021 in [place]  ?30 Day Unplanned Readmission Risk Score (%) 15.92 Filed at 08/27/2021 0001  ? ?  ? ? This score is the patient's risk of an unplanned readmission within 30 days of being discharged (0 -100%). The score is based on dignosis, age, lab data, medications, orders, and past utilization.   ?Low:  0-14.9   Medium: 15-21.9   High: 22-29.9   Extreme: 30 and above ? ?  ? ?  ? ? ? ?Admitted From: Home ?Disposition: [facility] ? ?Recommendations for Outpatient Follow-up:  ?Follow up with PCP in 1-2 weeks ?Please obtain BMP/CBC in one week ?Please follow up with your PCP on the following pending results: ?Unresulted Labs (From admission, onward)  ? ? None  ? ?  ?  ? ? ?Home Health: None ?Equipment/Devices: None ? ?Discharge Condition: Stable ?CODE STATUS: Full code ?Diet recommendation: Renal ? ?Subjective: Seen and examined earlier in the day.  She had no new complaint. ? ?Brief/Interim Summary: [name] is a 56 y.o. female with medical history significant of with history of hypertension, paraplegia, seizure disorder, chronic urinary catheter, abnormal uterine bleeding, and more presented to the ED with a chief complaint of wound on the right buttock.  Patient reports this wound has been present for about 1 month.  But on the day of admission, it started purulent drainage and some bleeding.  Last week she did have subjective fevers and chills.   Patient thinks she has been on vancomycin for about 1-2 weeks.  It is given on dialysis days.  Patient receives dialysis Monday Wednesday Friday.  Her last vancomycin dose was on 31 March.  Patient reports that she called wound care and told  them that the wound was draining and they were told to come to the ER for transfer to [facility].  However, when she arrived to the ED, [facility] did not have the bed to accept this patient so she was admitted at  [facility].  Upon arrival, her hemoglobin was 6.2.  She was transfused 1 unit of PRBC, her hemoglobin since then remained over 7. ? ?Cellulitis and abscess of right groin area: She underwent bedside incision and drainage with the general surgery by Dr. Arnoldo Morale on 08/26/2021.  The wound was left to drain and was draining well.  Comycin was continued.  I made a phone call around noon on 08/26/2021 to [facility] requesting a transfer since patient has establish care with the wound care clinic attached with the [facility], I received a call back at around 3 PM and I was told that they did not have the bed available however they were going to keep the patient on the list for potential transfer and will contact us tomorrow but then later on at around 7:30 PM, I was called from the transfer service that they have a bed and they can accept the patient.  I signed out to the hospitalist  Dr. Phillips Climes and patient was eventually transferred. ? ?ESRD on HD: Nephrology consulted but patient did not require any dialysis here since she gets her dialysis on MWF and hospitalist at [facility] was informed that patient is due for dialysis tomorrow morning. ? ?Discharge plan  was discussed with patient and/or family member and they verbalized understanding and agreed with it.  ?Discharge Diagnoses:  ?Principal Problem: ?  Anemia ?Active Problems: ?  Seizure disorder ([place]) ?  Essential hypertension ?  Abscess of buttock, right ?  GERD (gastroesophageal reflux disease) ?  Anxiety ?  Pressure injury of skin ? ? ? ?Discharge Instructions ? ? ?Allergies as of 08/26/2021   ? ?   Reactions  ? Benadryl [diphenhydramine Hcl (sleep)] Hives  ? Quinine Derivatives Other (See Comments)  ? Alters  mental status  ? Vancomycin   ? Pt is tolerating this medication at HD  ? Azithromycin Itching, Rash  ? Tetracycline Itching  ? Able to tolerate Doxycycline.   ? Zosyn [piperacillin Sod-tazobactam So] Rash  ? ?  ? ?  ?Medication List  ?  ? ?TAKE these medications   ? ?amLODipine 5 MG tablet ?Commonly known as: NORVASC ?Take 5 mg by mouth See admin instructions. Take 1 tablet on Monday,Wednesday and Friday then take 2 tablets on Tuesday,Thursday,Saturday and Sunday. ?  ?carvedilol 6.25 MG tablet ?Commonly known as: COREG ?Take 6.25 mg by mouth 2 (two) times daily with a meal. ?  ?cloNIDine 0.1 MG tablet ?Commonly known as: CATAPRES ?Take 0.1 mg by mouth 2 (two) times daily. ?  ?docusate sodium 100 MG capsule ?Commonly known as: COLACE ?Take 100 mg by mouth at bedtime. ?  ?furosemide 20 MG tablet ?Commonly known as: LASIX ?Take 20 mg by mouth daily. ?  ?hydrOXYzine 25 MG tablet ?Commonly known as: ATARAX ?Take 25 mg by mouth daily. ?  ?levETIRAcetam 500 MG tablet ?Commonly known as: KEPPRA ?Take 1,000 mg by mouth daily. ?  ?multivitamin tablet ?Take 1 tablet by mouth daily. ?  ?pantoprazole 40 MG tablet ?Commonly known as: PROTONIX ?Take 40 mg by mouth daily. ?  ?RENA-VITE RX PO ?Take 1 tablet by mouth daily. ?  ?sevelamer carbonate 800 MG tablet ?Commonly known as: RENVELA ?Take 800 mg by mouth 3 (three) times daily with meals. ?  ?Toviaz 8 MG Tb24 tablet ?Generic drug: fesoterodine ?Take 8 mg by mouth daily. ?  ? ?  ? ? ?Allergies  ?Allergen Reactions  ? Benadryl [Diphenhydramine Hcl (Sleep)] Hives  ? Quinine Derivatives Other (See Comments)  ?  Alters mental status  ? Vancomycin   ?  Pt is tolerating this medication at HD  ? Azithromycin Itching and Rash  ? Tetracycline Itching  ?  Able to tolerate Doxycycline.   ? Zosyn [Piperacillin Sod-Tazobactam So] Rash  ? ? ?Consultations: General surgery ? ? ?Procedures/Studies: ?CT ABDOMEN PELVIS W CONTRAST ? ?Result Date: 08/25/2021 ?CLINICAL DATA:  Acute nonlocalized  abdominal pain. EXAM: CT ABDOMEN AND PELVIS WITH CONTRAST TECHNIQUE: Multidetector CT imaging of the abdomen and pelvis was performed using the standard protocol following bolus administration of intravenous contrast. RADIATION DOSE REDUCTION: This exam was performed according to the departmental dose-optimization program which includes automated exposure control, adjustment of the mA and/or kV according to patient size and/or use of iterative reconstruction technique. CONTRAST:  1106m OMNIPAQUE IOHEXOL 300 MG/ML  SOLN COMPARISON:  04/28/2015 FINDINGS: Lower chest: Dependent atelectasis noted bilaterally with small right pleural effusion, similar to prior. The small left pleural effusion seen previously has resolved. Hepatobiliary: Subtle heterogeneity posterior right hepatic lobe is nonspecific and likely benign. No suspicious focal abnormality within the liver parenchyma. There is no evidence for gallstones, gallbladder wall thickening, or pericholecystic fluid. No intrahepatic biliary dilation. Common bile duct measures 8 mm diameter, similar  to prior study. Pancreas: No focal mass lesion. No dilatation of the main duct. No intraparenchymal cyst. No peripancreatic edema. Spleen: No splenomegaly. No focal mass lesion. Adrenals/Urinary Tract: No adrenal nodule or mass. Both kidneys are markedly atrophic. Gas is identified in the right intrarenal collecting system and right renal pelvis. No substantial hydroureter. Suprapubic tube decompresses the urinary bladder. Stomach/Bowel: Stomach is unremarkable. No gastric wall thickening. No evidence of outlet obstruction. Duodenum is normally positioned as is the ligament of Treitz. No small bowel wall thickening. No small bowel dilatation. The terminal ileum is normal. The appendix is normal. No gross colonic mass. No colonic wall thickening. Vascular/Lymphatic: There is mild atherosclerotic calcification of the abdominal aorta without aneurysm. There is no gastrohepatic  or hepatoduodenal ligament lymphadenopathy. No retroperitoneal or mesenteric lymphadenopathy. No pelvic sidewall lymphadenopathy. Reproductive: The uterus is unremarkable.  There is no adnexal mass. Other: No

## 2021-08-29 LAB — CULTURE, BLOOD (ROUTINE X 2)
Culture: NO GROWTH
Culture: NO GROWTH
Special Requests: ADEQUATE

## 2021-09-13 HISTORY — PX: APPLICATION OF WOUND VAC: SHX5189

## 2021-10-17 ENCOUNTER — Other Ambulatory Visit: Payer: Self-pay

## 2021-10-17 ENCOUNTER — Emergency Department (HOSPITAL_COMMUNITY)
Admission: EM | Admit: 2021-10-17 | Discharge: 2021-10-18 | Disposition: A | Discharge status: Home / Self Care | Payer: Medicare Other | Attending: Emergency Medicine | Admitting: Emergency Medicine

## 2021-10-17 ENCOUNTER — Encounter (HOSPITAL_COMMUNITY): Payer: Self-pay | Admitting: Emergency Medicine

## 2021-10-17 DIAGNOSIS — T8140XA Infection following a procedure, unspecified, initial encounter: Secondary | ICD-10-CM | POA: Diagnosis present

## 2021-10-17 DIAGNOSIS — Z992 Dependence on renal dialysis: Secondary | ICD-10-CM | POA: Insufficient documentation

## 2021-10-17 DIAGNOSIS — M869 Osteomyelitis, unspecified: Secondary | ICD-10-CM

## 2021-10-17 NOTE — ED Notes (Signed)
ED Provider at bedside. 

## 2021-10-17 NOTE — ED Provider Notes (Signed)
[facility] EMERGENCY DEPARTMENT Provider Note   CSN: [id number] Arrival date & time: 10/17/21  1706     History  Chief Complaint  Patient presents with   Wound Infection    [name] is a 56 y.o. female.  Patient followed at [facility] by wound care and by orthopedics.  Has a wound VAC in place.  On the right hip.  Patient has had a longstanding wound in that area.  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  Patient called EMS and was brought here.  But her intent was to get to [facility].  Patient is a dialysis patient normally dialyzed Monday Wednesdays and Fridays.  Was dialyzed today.  Patient has not had any fevers.  Patient was not aware that anything was worse she just got a phone call about the the wound bone cultures.  Patient has been feeling fine as mentioned no fevers.  Not feeling ill.  Patient is a paraplegic.      Home Medications Prior to Admission medications   Medication Sig Start Date End Date Taking? Authorizing Provider  amLODipine (NORVASC) 5 MG tablet Take 5 mg by mouth See admin instructions. Take 1 tablet on Monday,Wednesday and Friday then take 2 tablets on Tuesday,Thursday,Saturday and Sunday. 11/24/18  Yes [provider]  B Complex-C-Folic Acid (RENA-VITE RX PO) Take 1 tablet by mouth daily.   Yes [provider]  carvedilol (COREG) 6.25 MG tablet Take 6.25 mg by mouth 2 (two) times daily with a meal.   Yes [provider]  cloNIDine (CATAPRES) 0.1 MG tablet Take 0.1 mg by mouth 2 (two) times daily.    Yes [provider]  docusate sodium (COLACE) 100 MG capsule Take 100 mg by mouth at bedtime.   Yes [provider]  furosemide (LASIX) 20 MG tablet Take 20 mg by mouth daily. 08/14/21  Yes [provider]  hydrOXYzine (ATARAX/VISTARIL) 25 MG tablet Take 25 mg by mouth daily.    Yes [provider]  levETIRAcetam (KEPPRA) 500  MG tablet Take 1,000 mg by mouth daily. 08/09/21  Yes [provider]  Multiple Vitamin (MULTIVITAMIN) tablet Take 1 tablet by mouth daily.   Yes [provider]  sevelamer carbonate (RENVELA) 800 MG tablet Take 800 mg by mouth 3 (three) times daily with meals.   Yes [provider]      Allergies    Benadryl [diphenhydramine hcl (sleep)], Quinine derivatives, Vancomycin, Azithromycin, Tetracycline, and Zosyn [piperacillin sod-tazobactam so]    Review of Systems   Review of Systems  Constitutional:  Negative for chills and fever.  HENT:  Negative for ear pain and sore throat.   Eyes:  Negative for pain and visual disturbance.  Respiratory:  Negative for cough and shortness of breath.   Cardiovascular:  Negative for chest pain and palpitations.  Gastrointestinal:  Negative for abdominal pain and vomiting.  Genitourinary:  Negative for dysuria and hematuria.  Musculoskeletal:  Negative for arthralgias and back pain.  Skin:  Positive for wound. Negative for color change and rash.  Neurological:  Negative for seizures and syncope.  All other systems reviewed and are negative.  Physical Exam Updated Vital Signs BP (!) 146/77   Pulse 93   Temp 98.3 F (36.8 C) (Oral)   Resp 20   Ht 1.651 m ('5\' 5"'$ )   Wt 54.4 kg   LMP  (LMP Unknown)   SpO2 94%  BMI 19.97 kg/m  Physical Exam Vitals and nursing note reviewed.  Constitutional:      General: She is not in acute distress.    Appearance: Normal appearance. She is well-developed.  HENT:     Head: Normocephalic and atraumatic.  Eyes:     Extraocular Movements: Extraocular movements intact.     Conjunctiva/sclera: Conjunctivae normal.     Pupils: Pupils are equal, round, and reactive to light.  Cardiovascular:     Rate and Rhythm: Normal rate and regular rhythm.     Heart sounds: No murmur heard. Pulmonary:     Effort: Pulmonary effort is normal. No respiratory distress.     Breath sounds: Normal breath  sounds.  Abdominal:     Palpations: Abdomen is soft.     Tenderness: There is no abdominal tenderness.  Musculoskeletal:        General: No swelling.     Cervical back: Normal range of motion and neck supple.     Comments: Wound VAC and dressed wound on right hip area.  Patient with AV fistula of left upper extremity with good thrill  Skin:    General: Skin is warm and dry.     Capillary Refill: Capillary refill takes less than 2 seconds.  Neurological:     General: No focal deficit present.     Mental Status: She is alert and oriented to person, place, and time.  Psychiatric:        Mood and Affect: Mood normal.    ED Results / Procedures / Treatments   Labs (all labs ordered are listed, but only abnormal results are displayed) Labs Reviewed - No data to display  EKG None  Radiology No results found.  Procedures Procedures    Medications Ordered in ED Medications - No data to display  ED Course/ Medical Decision Making/ A&P                           Medical Decision Making  Patient nontoxic no acute distress.  Patient did undergo dialysis today.  Patient without fevers.  Vital signs look good.  We have been in contact with [facility].  They are telling us that they are not even putting people on the waiting list.  They have no beds available at all.  So is unknown how long patient would be here before could be transferred.  Most likely would be days.  Patient with this information since not toxic wants to go home does not want [place] to proceed with work-up here.  And will have her son drive her to the ED at [facility].  Patient has been through this here before where she sat for at least 2 days in the past trying to get transferred there.  Patient was brought in by EMS naturally they would bring her here.  Patient has agreed to come back if she gets any fevers or any new or worse symptoms.  However patient does appear to be very clinically stable.   Final  Clinical Impression(s) / ED Diagnoses Final diagnoses:  None    Rx / DC Orders ED Discharge Orders     None         Fredia Sorrow, MD 10/17/21 2227

## 2021-10-17 NOTE — Discharge Instructions (Signed)
Recommend you get family members to drive you to [facility] tomorrow.  Since that is where you are wound care is and we orthopedic surgeons are.  As we discussed we could work you up here and make arrangements to get you transferred to [facility].  Currently no beds available at [facility] that you want your family member just to drive you there tomorrow.  If you develop any fevers or start feeling sick you need to get seen right away and obviously we could see you here for that.

## 2021-10-17 NOTE — ED Triage Notes (Signed)
Pt states that she was called by her Dr today and told that she needs to go to the hospital due to bone infection. Pt seen by [facility] providers, but closest place for the pt is here at [facility]- she came by ambulance. 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 Pt had surgery on her rt hip and wound vac placed a couple of months ago.

## 2021-11-23 ENCOUNTER — Other Ambulatory Visit: Payer: Self-pay

## 2021-11-23 ENCOUNTER — Emergency Department (HOSPITAL_COMMUNITY)
Admission: EM | Admit: 2021-11-23 | Discharge: 2021-11-23 | Disposition: A | Discharge status: Home / Self Care | Payer: Medicare Other | Attending: Emergency Medicine | Admitting: Emergency Medicine

## 2021-11-23 ENCOUNTER — Encounter (HOSPITAL_COMMUNITY): Payer: Self-pay | Admitting: Emergency Medicine

## 2021-11-23 DIAGNOSIS — N186 End stage renal disease: Secondary | ICD-10-CM | POA: Diagnosis present

## 2021-11-23 DIAGNOSIS — Z992 Dependence on renal dialysis: Secondary | ICD-10-CM | POA: Insufficient documentation

## 2021-11-23 DIAGNOSIS — Z79899 Other long term (current) drug therapy: Secondary | ICD-10-CM | POA: Insufficient documentation

## 2021-11-23 LAB — CBC WITH DIFFERENTIAL/PLATELET
Abs Immature Granulocytes: 0.01 10*3/uL (ref 0.00–0.07)
Basophils Absolute: 0.1 10*3/uL (ref 0.0–0.1)
Basophils Relative: 1 %
Eosinophils Absolute: 0.4 10*3/uL (ref 0.0–0.5)
Eosinophils Relative: 7 %
HCT: 31.4 % — ABNORMAL LOW (ref 36.0–46.0)
Hemoglobin: 10.2 g/dL — ABNORMAL LOW (ref 12.0–15.0)
Immature Granulocytes: 0 %
Lymphocytes Relative: 32 %
Lymphs Abs: 1.9 10*3/uL (ref 0.7–4.0)
MCH: 30.1 pg (ref 26.0–34.0)
MCHC: 32.5 g/dL (ref 30.0–36.0)
MCV: 92.6 fL (ref 80.0–100.0)
Monocytes Absolute: 0.5 10*3/uL (ref 0.1–1.0)
Monocytes Relative: 8 %
Neutro Abs: 3.1 10*3/uL (ref 1.7–7.7)
Neutrophils Relative %: 52 %
Platelets: 173 10*3/uL (ref 150–400)
RBC: 3.39 MIL/uL — ABNORMAL LOW (ref 3.87–5.11)
RDW: 15.3 % (ref 11.5–15.5)
WBC: 5.9 10*3/uL (ref 4.0–10.5)
nRBC: 0 % (ref 0.0–0.2)

## 2021-11-23 LAB — BASIC METABOLIC PANEL
Anion gap: 13 (ref 5–15)
BUN: 70 mg/dL — ABNORMAL HIGH (ref 6–20)
CO2: 21 mmol/L — ABNORMAL LOW (ref 22–32)
Calcium: 8.9 mg/dL (ref 8.9–10.3)
Chloride: 103 mmol/L (ref 98–111)
Creatinine, Ser: 8.77 mg/dL — ABNORMAL HIGH (ref 0.44–1.00)
GFR, Estimated: 5 mL/min — ABNORMAL LOW (ref >60)
Glucose, Bld: 90 mg/dL (ref 70–99)
Potassium: 4.2 mmol/L (ref 3.5–5.1)
Sodium: 137 mmol/L (ref 135–145)

## 2021-11-23 NOTE — ED Provider Notes (Signed)
[facility] EMERGENCY DEPARTMENT Provider Note   CSN: [id number] Arrival date & time: 11/23/21  1350     History {Add pertinent medical, surgical, social history, OB history to HPI:1} No chief complaint on file.   [name] is a 56 y.o. female.  She has a history of paraplegia end-stage renal disease and ischial decubitus with osteomyelitis.  She just got discharged from rehab 4 days ago.  She has only had dialysis once this week due to transportation issues.  Dialysis called her today and told her they would do her Monday but they wanted her to come here to get lab work to make sure that she was safe to wait till Monday.  She is also waiting on a new wound VAC for her buttock wound.  No fevers or chills no shortness of breath or chest pain.  She said she feels well  The history is provided by the patient.       Home Medications Prior to Admission medications   Medication Sig Start Date End Date Taking? Authorizing Provider  amLODipine (NORVASC) 5 MG tablet Take 5 mg by mouth See admin instructions. Take 1 tablet on Monday,Wednesday and Friday then take 2 tablets on Tuesday,Thursday,Saturday and Sunday. 11/24/18   [provider]  B Complex-C-Folic Acid (RENA-VITE RX PO) Take 1 tablet by mouth daily.    [provider]  carvedilol (COREG) 6.25 MG tablet Take 6.25 mg by mouth 2 (two) times daily with a meal.    [provider]  cloNIDine (CATAPRES) 0.1 MG tablet Take 0.1 mg by mouth 2 (two) times daily.     [provider]  docusate sodium (COLACE) 100 MG capsule Take 100 mg by mouth at bedtime.    [provider]  furosemide (LASIX) 20 MG tablet Take 20 mg by mouth daily. 08/14/21   [provider]  hydrOXYzine (ATARAX/VISTARIL) 25 MG tablet Take 25 mg by mouth daily.     [provider]  levETIRAcetam (KEPPRA) 500 MG tablet Take 1,000 mg by mouth daily. 08/09/21   [provider]  Multiple Vitamin  (MULTIVITAMIN) tablet Take 1 tablet by mouth daily.    [provider]  sevelamer carbonate (RENVELA) 800 MG tablet Take 800 mg by mouth 3 (three) times daily with meals.    [provider]      Allergies    Benadryl [diphenhydramine hcl (sleep)], Quinine derivatives, Vancomycin, Azithromycin, Tetracycline, and Zosyn [piperacillin sod-tazobactam so]    Review of Systems   Review of Systems  Constitutional:  Negative for fever.  HENT:  Negative for sore throat.   Respiratory:  Negative for shortness of breath.   Cardiovascular:  Negative for chest pain.  Gastrointestinal:  Negative for nausea and vomiting.  Skin:  Positive for wound.  Neurological:  Negative for headaches.    Physical Exam Updated Vital Signs BP (!) 150/73 (BP Location: Right Arm)   Pulse 72   Temp 98.6 F (37 C) (Oral)   Resp 18   Ht '5\' 5"'$  (1.651 m)   Wt 54.4 kg   LMP  (LMP Unknown)   SpO2 98%   BMI 19.96 kg/m  Physical Exam Vitals and nursing note reviewed.  Constitutional:      General: She is not in acute distress.    Appearance: Normal appearance. She is well-developed.  HENT:     Head: Normocephalic and atraumatic.  Eyes:     Conjunctiva/sclera: Conjunctivae normal.  Cardiovascular:     Rate and Rhythm:  Normal rate and regular rhythm.     Heart sounds: No murmur heard. Pulmonary:     Effort: Pulmonary effort is normal. No respiratory distress.     Breath sounds: Normal breath sounds.  Abdominal:     Palpations: Abdomen is soft.     Tenderness: There is no abdominal tenderness. There is no guarding or rebound.  Musculoskeletal:     Cervical back: Neck supple.     Right lower leg: No edema.     Left lower leg: No edema.  Skin:    General: Skin is warm and dry.     Capillary Refill: Capillary refill takes less than 2 seconds.  Neurological:     Mental Status: She is alert. Mental status is at baseline.     Comments: She is awake and alert.  Full use of upper extremities.   No use of lower extremities.     ED Results / Procedures / Treatments   Labs (all labs ordered are listed, but only abnormal results are displayed) Labs Reviewed - No data to display  EKG None  Radiology No results found.  Procedures Procedures  {Document cardiac monitor, telemetry assessment procedure when appropriate:1}  Medications Ordered in ED Medications - No data to display  ED Course/ Medical Decision Making/ A&P                           Medical Decision Making Amount and/or Complexity of Data Reviewed Labs: ordered.  This patient complains of ***; this involves an extensive number of treatment Options and is a complaint that carries with it a high risk of complications and morbidity. The differential includes ***  I ordered, reviewed and interpreted labs, which included *** I ordered medication *** and reviewed PMP when indicated. I ordered imaging studies which included *** and I independently    visualized and interpreted imaging which showed *** Additional history obtained from *** Previous records obtained and reviewed *** I consulted *** and discussed lab and imaging findings and discussed disposition.  Cardiac monitoring reviewed, *** Social determinants considered, *** Critical Interventions: ***  After the interventions stated above, I reevaluated the patient and found *** Admission and further testing considered, ***    {Document critical care time when appropriate:1} {Document review of labs and clinical decision tools ie heart score, Chads2Vasc2 etc:1}  {Document your independent review of radiology images, and any outside records:1} {Document your discussion with family members, caretakers, and with consultants:1} {Document social determinants of health affecting pt's care:1} {Document your decision making why or why not admission, treatments were needed:1} Final Clinical Impression(s) / ED Diagnoses Final diagnoses:  None    Rx / DC  Orders ED Discharge Orders     None

## 2021-11-23 NOTE — Discharge Instructions (Signed)
You were seen in the emergency department for evaluation of any lab abnormalities in the setting of having missed dialysis.  Your lab work was unremarkable your oxygen level was good.  I think it would be okay for you to wait until Monday for your next dialysis.  If you experience any shortness of breath or feeling worse in any way please return to the emergency department for evaluation.

## 2021-11-23 NOTE — ED Triage Notes (Signed)
Pt to the ED  with complaints of abnormal labs, specifically her calcium. Pt also has a rt leg wound and is currently on antibiotics. Pt is also a paraplegic and is wheelchair bound.

## 2022-02-20 ENCOUNTER — Encounter: Payer: Self-pay | Admitting: Vascular Surgery

## 2022-02-20 ENCOUNTER — Ambulatory Visit (INDEPENDENT_AMBULATORY_CARE_PROVIDER_SITE_OTHER): Payer: Medicare Other | Admitting: Vascular Surgery

## 2022-02-20 VITALS — BP 111/69 | HR 87 | Temp 98.1°F | Ht 65.0 in | Wt 140.0 lb

## 2022-02-20 DIAGNOSIS — Z992 Dependence on renal dialysis: Secondary | ICD-10-CM | POA: Diagnosis not present

## 2022-02-20 DIAGNOSIS — N186 End stage renal disease: Secondary | ICD-10-CM

## 2022-02-20 NOTE — Progress Notes (Signed)
[facility] [place]  Patient name: [name] MRN: [id number] DOB: 13-May-1966 Sex: female  REASON FOR CONSULT: Evaluate left arm AV fistula with possible steal syndrome  HPI: [name] is a 56 y.o. female, who is here for evaluation of possible steal syndrome.  He has a history of end-stage renal disease and has had excellent use of her left upper arm AV fistula for years.  In looking through her chart, this was created at [facility] in September 2019.  She reports that she did have a catheter initially for dialysis.  She reports that she occasionally has cramping in her left hand on dialysis.  She also occasionally has cramping in her hand at home but this is not limiting to her.  She specifically denies any pain or numbness or weakness in her left hand.  He is paraplegic in a motorized wheelchair  Past Medical History:  Diagnosis Date   Abnormal uterine bleeding (AUB) 06/15/2014   Cancer (HCC)    uterine   High blood pressure    Paraplegia (lower)    Seizure disorder (HCC)    Seizures (HCC)    Suprapubic catheter ([place])    Urinary tract infection     Family History  Problem Relation Age of Onset   Cancer Mother    Hypertension Mother    Cancer Sister        breast and then spread everywhere.   Diabetes Paternal Grandmother    Hypertension Paternal Grandmother     SOCIAL HISTORY: Social History   Socioeconomic History   Marital status: Single    Spouse name: Not on file   Number of children: 3   Years of education: 9 th   Highest education level: Not on file  Occupational History    Comment: Disabled  Tobacco Use   Smoking status: Never   Smokeless tobacco: Never  Vaping Use   Vaping Use: Never used  Substance and Sexual Activity   Alcohol use: Yes    Comment: Occassionally   Drug use: No   Sexual activity: Never  Other Topics Concern   Not on file  Social History Narrative    Patient lives with her son Valarie Merino). Patient is disabled.   Education 9th grade.   Right handed.   Caffeine - None    Social Determinants of Health   Financial Resource Strain: Not on file  Food Insecurity: Not on file  Transportation Needs: Not on file  Physical Activity: Not on file  Stress: Not on file  Social Connections: Not on file  Intimate Partner Violence: Not on file    Allergies  Allergen Reactions   Benadryl [Diphenhydramine Hcl (Sleep)] Hives   Quinine Derivatives Other (See Comments)    Alters mental status   Vancomycin     Pt is tolerating this medication at HD   Azithromycin Itching and Rash   Tetracycline Itching    Able to tolerate Doxycycline.    Zosyn [Piperacillin Sod-Tazobactam So] Rash    Current Outpatient Medications  Medication Sig Dispense Refill   amLODipine (NORVASC) 5 MG tablet Take 5 mg by mouth See admin instructions. Take 1 tablet on Monday,Wednesday and Friday then take 2 tablets on Tuesday,Thursday,Saturday and Sunday.     B Complex-C-Folic Acid (RENA-VITE RX PO) Take 1 tablet by mouth daily.     carvedilol (COREG) 6.25 MG tablet Take 6.25 mg by mouth 2 (two) times daily with a meal.     cloNIDine (CATAPRES)  0.1 MG tablet Take 0.1 mg by mouth 2 (two) times daily.      docusate sodium (COLACE) 100 MG capsule Take 100 mg by mouth at bedtime.     furosemide (LASIX) 20 MG tablet Take 20 mg by mouth daily.     hydrOXYzine (ATARAX/VISTARIL) 25 MG tablet Take 25 mg by mouth daily.      levETIRAcetam (KEPPRA) 500 MG tablet Take 1,000 mg by mouth daily.     Multiple Vitamin (MULTIVITAMIN) tablet Take 1 tablet by mouth daily.     sevelamer carbonate (RENVELA) 800 MG tablet Take 800 mg by mouth 3 (three) times daily with meals.     No current facility-administered medications for this visit.    REVIEW OF SYSTEMS:  '[X]'$  denotes positive finding, '[ ]'$  denotes negative finding Cardiac  Comments:  Chest pain or chest pressure:    Shortness of breath  upon exertion:    Short of breath when lying flat:    Irregular heart rhythm:        Vascular    Pain in calf, thigh, or hip brought on by ambulation:    Pain in feet at night that wakes you up from your sleep:     Blood clot in your veins:    Leg swelling:         Pulmonary    Oxygen at home:    Productive cough:     Wheezing:         Neurologic    Sudden weakness in arms or legs:     Sudden numbness in arms or legs:     Sudden onset of difficulty speaking or slurred speech:    Temporary loss of vision in one eye:     Problems with dizziness:         Gastrointestinal    Blood in stool:     Vomited blood:         Genitourinary    Burning when urinating:     Blood in urine:        Psychiatric    Major depression:         Hematologic    Bleeding problems:    Problems with blood clotting too easily:        Skin    Rashes or ulcers:        Constitutional    Fever or chills:      PHYSICAL EXAM: Vitals:   02/20/22 0946  BP: 111/69  Pulse: 87  Temp: 98.1 F (36.7 C)  SpO2: 97%  Weight: 140 lb (63.5 kg)  Height: '5\' 5"'$  (1.651 m)    GENERAL: The patient is a well-nourished female, in no acute distress. The vital signs are documented above. CARDIOVASCULAR: Left radial pulse with her fistula patent.  On compression of her fistula she has a 2-3+ left radial pulse.  She does have a very well-developed fistula throughout her left upper arm.  There is some ectasia but no evidence of skin breakdown. PULMONARY: There is good air exchange  MUSCULOSKELETAL: There are no major deformities or cyanosis. NEUROLOGIC: No focal weakness or paresthesias are detected. SKIN: There are no ulcers or rashes noted. PSYCHIATRIC: The patient has a normal affect.  DATA:  None  MEDICAL ISSUES: Excellent functioning of her left upper arm AV fistula for years.  She is having mild steal symptoms clinically.  I discussed with her concern for progression of pain, numbness or tissue loss.   She reports that this is mildly  limiting to her.  I did explain that if this became more progressive that we would consider surgical treatment such as banding to reduce her steal symptoms.  She is comfortable with observation only at this time and will see [place] again on an as-needed basis   Rosetta Posner, MD [facility] [phone] Pager [phone]  Note: Portions of this report may have been transcribed using voice recognition software.  Every effort has been made to ensure accuracy; however, inadvertent computerized transcription errors may still be present.

## 2022-02-25 ENCOUNTER — Emergency Department (HOSPITAL_COMMUNITY): Payer: Medicare Other

## 2022-02-25 ENCOUNTER — Other Ambulatory Visit: Payer: Self-pay

## 2022-02-25 ENCOUNTER — Observation Stay (HOSPITAL_COMMUNITY)
Admission: EM | Admit: 2022-02-25 | Discharge: 2022-02-28 | Disposition: A | Discharge status: Home / Self Care | Payer: Medicare Other | Attending: Internal Medicine | Admitting: Internal Medicine

## 2022-02-25 DIAGNOSIS — Z8542 Personal history of malignant neoplasm of other parts of uterus: Secondary | ICD-10-CM | POA: Diagnosis not present

## 2022-02-25 DIAGNOSIS — D500 Iron deficiency anemia secondary to blood loss (chronic): Secondary | ICD-10-CM | POA: Insufficient documentation

## 2022-02-25 DIAGNOSIS — I120 Hypertensive chronic kidney disease with stage 5 chronic kidney disease or end stage renal disease: Secondary | ICD-10-CM | POA: Insufficient documentation

## 2022-02-25 DIAGNOSIS — N186 End stage renal disease: Secondary | ICD-10-CM | POA: Insufficient documentation

## 2022-02-25 DIAGNOSIS — L8990 Pressure ulcer of unspecified site, unspecified stage: Secondary | ICD-10-CM | POA: Diagnosis present

## 2022-02-25 DIAGNOSIS — I959 Hypotension, unspecified: Principal | ICD-10-CM | POA: Insufficient documentation

## 2022-02-25 DIAGNOSIS — L89203 Pressure ulcer of unspecified hip, stage 3: Secondary | ICD-10-CM

## 2022-02-25 DIAGNOSIS — Z992 Dependence on renal dialysis: Secondary | ICD-10-CM | POA: Diagnosis present

## 2022-02-25 DIAGNOSIS — L89899 Pressure ulcer of other site, unspecified stage: Secondary | ICD-10-CM | POA: Insufficient documentation

## 2022-02-25 DIAGNOSIS — I10 Essential (primary) hypertension: Secondary | ICD-10-CM | POA: Diagnosis present

## 2022-02-25 DIAGNOSIS — G40909 Epilepsy, unspecified, not intractable, without status epilepticus: Secondary | ICD-10-CM

## 2022-02-25 DIAGNOSIS — Z79899 Other long term (current) drug therapy: Secondary | ICD-10-CM | POA: Insufficient documentation

## 2022-02-25 DIAGNOSIS — M869 Osteomyelitis, unspecified: Secondary | ICD-10-CM | POA: Diagnosis present

## 2022-02-25 DIAGNOSIS — G8220 Paraplegia, unspecified: Secondary | ICD-10-CM | POA: Diagnosis present

## 2022-02-25 DIAGNOSIS — Z9359 Other cystostomy status: Secondary | ICD-10-CM

## 2022-02-25 DIAGNOSIS — D649 Anemia, unspecified: Secondary | ICD-10-CM | POA: Diagnosis not present

## 2022-02-25 LAB — CBC WITH DIFFERENTIAL/PLATELET
Abs Immature Granulocytes: 0.07 10*3/uL (ref 0.00–0.07)
Basophils Absolute: 0 10*3/uL (ref 0.0–0.1)
Basophils Relative: 1 %
Eosinophils Absolute: 0.3 10*3/uL (ref 0.0–0.5)
Eosinophils Relative: 3 %
HCT: 19.7 % — ABNORMAL LOW (ref 36.0–46.0)
Hemoglobin: 6 g/dL — CL (ref 12.0–15.0)
Immature Granulocytes: 1 %
Lymphocytes Relative: 22 %
Lymphs Abs: 1.8 10*3/uL (ref 0.7–4.0)
MCH: 30.5 pg (ref 26.0–34.0)
MCHC: 30.5 g/dL (ref 30.0–36.0)
MCV: 100 fL (ref 80.0–100.0)
Monocytes Absolute: 0.6 10*3/uL (ref 0.1–1.0)
Monocytes Relative: 7 %
Neutro Abs: 5.3 10*3/uL (ref 1.7–7.7)
Neutrophils Relative %: 66 %
Platelets: 399 10*3/uL (ref 150–400)
RBC: 1.97 MIL/uL — ABNORMAL LOW (ref 3.87–5.11)
RDW: 16 % — ABNORMAL HIGH (ref 11.5–15.5)
WBC: 8 10*3/uL (ref 4.0–10.5)
nRBC: 0 % (ref 0.0–0.2)

## 2022-02-25 LAB — BASIC METABOLIC PANEL
Anion gap: 8 (ref 5–15)
BUN: 18 mg/dL (ref 6–20)
CO2: 28 mmol/L (ref 22–32)
Calcium: 8 mg/dL — ABNORMAL LOW (ref 8.9–10.3)
Chloride: 99 mmol/L (ref 98–111)
Creatinine, Ser: 2.66 mg/dL — ABNORMAL HIGH (ref 0.44–1.00)
GFR, Estimated: 21 mL/min — ABNORMAL LOW (ref >60)
Glucose, Bld: 91 mg/dL (ref 70–99)
Potassium: 3.5 mmol/L (ref 3.5–5.1)
Sodium: 135 mmol/L (ref 135–145)

## 2022-02-25 LAB — LACTIC ACID, PLASMA
Lactic Acid, Venous: 0.7 mmol/L (ref 0.5–1.9)
Lactic Acid, Venous: 0.9 mmol/L (ref 0.5–1.9)

## 2022-02-25 LAB — PREPARE RBC (CROSSMATCH)

## 2022-02-25 MED ORDER — VANCOMYCIN HCL IN DEXTROSE 1-5 GM/200ML-% IV SOLN
1000.0000 mg | Freq: Once | INTRAVENOUS | Status: AC
  Administered 2022-02-25: 1000 mg via INTRAVENOUS
  Filled 2022-02-25: qty 200

## 2022-02-25 MED ORDER — METRONIDAZOLE 500 MG/100ML IV SOLN
500.0000 mg | Freq: Two times a day (BID) | INTRAVENOUS | Status: DC
  Administered 2022-02-26: 500 mg via INTRAVENOUS
  Filled 2022-02-25: qty 100

## 2022-02-25 MED ORDER — SODIUM CHLORIDE 0.9 % IV SOLN
10.0000 mL/h | Freq: Once | INTRAVENOUS | Status: AC
  Administered 2022-02-25: 10 mL/h via INTRAVENOUS

## 2022-02-25 MED ORDER — VANCOMYCIN HCL 750 MG/150ML IV SOLN: 750.0000 mg | INTRAVENOUS | Status: DC

## 2022-02-25 MED ORDER — SODIUM CHLORIDE 0.9 % IV SOLN
1.0000 g | INTRAVENOUS | Status: DC
  Administered 2022-02-26: 1 g via INTRAVENOUS
  Filled 2022-02-25 (×3): qty 10
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
## 2022-02-25 NOTE — ED Triage Notes (Signed)
BIB EMS for wound evaluation- states her daughter takes care of her wound but stated "she said it didn't look good"
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
## 2022-02-25 NOTE — Assessment & Plan Note (Addendum)
Hemoglobin down to 6, baseline 8-10.  No melena hematochezia or hematemesis.  No NSAID use.  History of anemia of chronic disease, 6 months ago serum iron 47, total iron binding capacity low at 152, normal iron saturation of 31--consistent with anemia of chronic disease. -Transfuse 1 unit PRBC, considering ESRD status -Hg improved to 7.7 after 1 unit PRBC, recheck CBC in AM  -nephrology to decide if another unit with HD is needed

## 2022-02-25 NOTE — Progress Notes (Signed)
Pharmacy Antibiotic Note  [name] is a 56 y.o. female admitted on 02/25/2022 with multiple decubitus ulcers with chronic osteomyelitis involving the coccyx, bilateral posterior acetabulum/right inferior pubic ramus, and left proximal femur/greater trochanter with perirectal fluid collection, suspicious for anorectal abscess. Pharmacy has been consulted for vancomycin dosing. Noted patient is on dialysis MWF, last received 10/2 PTA.  Plan: Vancomycin 1g load then '750mg'$  post-HD MWF Monitor cultures, clinical status, HD schedule, vancomycin level Narrow abx as able and f/u duration    Height: '5\' 4"'$  (162.6 cm) Weight: 63.5 kg (140 lb) IBW/kg (Calculated) : 54.7  Temp (24hrs), Avg:98.4 F (36.9 C), Min:98.4 F (36.9 C), Max:98.4 F (36.9 C)  Recent Labs  Lab 02/25/22 1816 02/25/22 2000  WBC 8.0  --   CREATININE 2.66*  --   LATICACIDVEN 0.7 0.9    Estimated Creatinine Clearance: 20.9 mL/min (A) (by C-G formula based on SCr of 2.66 mg/dL (H)).    Allergies  Allergen Reactions   Benadryl [Diphenhydramine Hcl (Sleep)] Hives   Quinine Derivatives Other (See Comments)    Alters mental status   Vancomycin     Pt is tolerating this medication at HD   Azithromycin Itching and Rash   Tetracycline Itching    Able to tolerate Doxycycline.    Zosyn [Piperacillin Sod-Tazobactam So] Rash    Antimicrobials this admission: Vanc 10/2 >>    Dose adjustments this admission: N/a  Microbiology results: 10/2  BCx: pend   Thank you for allowing pharmacy to be a part of this patient's care.  Benetta Spar, PharmD, BCPS, BCCP Clinical Pharmacist  Please check AMION for all [place] phone numbers After 10:00 PM, call [place] [phone]

## 2022-02-25 NOTE — Assessment & Plan Note (Addendum)
Decubitus ulcer to right and left greater trochanteric areas of hip.  Right hip wound appears clean, but left hip wound has purulent drainage but no surrounding cellulitis.  Rules out for sepsis.  Afebrile.  WBC 8.  Normal lactic acid 0.7.  Follows with wound care at [facility].  Also has a home health nurse that dresses her wounds.  She tells me she has a follow-up appointment with wound care at [facility] for next week. -I confirmed with patient about antibiotics-she was getting IV vancomycin and ertapenem with dialysis up  Until 2 weeks ago. -I talked to general surgeon on-call Dr. Okey Dupre, reviewed images, will see patient in a.m, at this time it does not appear there is an indication to transfer -Patient may just able to follow-up with her outpatient provider, with oral antibiotics -DC IV Vanco cefepime and metronidazole -I discussed with surgery, no need found to continue antibiotics.  Will DC them today 10/3.  -Follow-up blood cultures

## 2022-02-25 NOTE — ED Provider Notes (Signed)
Physical Exam  BP (!) 108/58 (BP Location: Right Arm)   Pulse 71   Temp 98.4 F (36.9 C) (Oral)   Resp 18   Ht '5\' 4"'$  (1.626 m)   Wt 63.5 kg   LMP  (LMP Unknown)   SpO2 99%   BMI 24.03 kg/m   Physical Exam Vitals and nursing note reviewed.  Constitutional:      General: She is not in acute distress.    Appearance: Normal appearance. She is not ill-appearing or toxic-appearing.  Eyes:     General: No scleral icterus. Pulmonary:     Effort: Pulmonary effort is normal. No respiratory distress.  Skin:    General: Skin is dry.     Findings: No rash.     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  It appears to be tunneling superiorly towards the buttocks.  There is some purulent foul-smelling discharge as well.  I do not see any red streaking or swelling around the area.  Neurological:     Mental Status: She is alert. Mental status is at baseline.  Psychiatric:        Mood and Affect: Mood normal.     Procedures  Procedures  ED Course / MDM    Medical Decision Making Amount and/or Complexity of Data Reviewed Labs: ordered. Radiology: ordered.  Risk Prescription drug management. Decision regarding hospitalization.   Accepted handoff at shift change from Evalee Jefferson, PA-C. Please see prior provider note for more detail.   Briefly: Patient is 56 y.o. F presents to the ED for evaluation of wounds.  DDX: concern for osteomyelitis  Plan: admission  Please see prior note for additional detail.  Received this patient in handoff for admission.  Patient was seen here today for evaluation of her wounds which appear to be worsening.  Concern for osteomyelitis which was seen on CT imaging from today as chronic appearing however there seems to be a new rectal abscess.  Patient has been seen for this before and placed on vancomycin.  Previous shift started on vancomycin.  I discussed this patient with admission team Dr. Denton Brick who has consulted our  general surgery team who accepts the patient as well.  Patient is also being admitted for transfusion due to low hemoglobin.  Results for orders placed or performed during the hospital encounter of 02/25/22  Blood culture (routine x 2)   Specimen: BLOOD RIGHT ARM  Result Value Ref Range   Specimen Description BLOOD RIGHT ARM    Special Requests      BOTTLES DRAWN AEROBIC AND ANAEROBIC Blood Culture adequate volume Performed at [facility], [address], [place], [place] [address]    Culture PENDING    Report Status PENDING   Blood culture (routine x 2)   Specimen: BLOOD RIGHT ARM  Result Value Ref Range   Specimen Description BLOOD RIGHT ARM    Special Requests      BOTTLES DRAWN AEROBIC AND ANAEROBIC Blood Culture adequate volume Performed at [facility], [address] [place], [place] [address]    Culture PENDING    Report Status PENDING   CBC with Differential  Result Value Ref Range   WBC 8.0 4.0 - 10.5 K/uL   RBC 1.97 (L) 3.87 - 5.11 MIL/uL   Hemoglobin 6.0 (LL) 12.0 - 15.0 g/dL   HCT 19.7 (L) 36.0 - 46.0 %   MCV 100.0 80.0 - 100.0 fL   MCH 30.5 26.0 - 34.0 pg  MCHC 30.5 30.0 - 36.0 g/dL   RDW 16.0 (H) 11.5 - 15.5 %   Platelets 399 150 - 400 K/uL   nRBC 0.0 0.0 - 0.2 %   Neutrophils Relative % 66 %   Neutro Abs 5.3 1.7 - 7.7 K/uL   Lymphocytes Relative 22 %   Lymphs Abs 1.8 0.7 - 4.0 K/uL   Monocytes Relative 7 %   Monocytes Absolute 0.6 0.1 - 1.0 K/uL   Eosinophils Relative 3 %   Eosinophils Absolute 0.3 0.0 - 0.5 K/uL   Basophils Relative 1 %   Basophils Absolute 0.0 0.0 - 0.1 K/uL   Immature Granulocytes 1 %   Abs Immature Granulocytes 0.07 0.00 - 0.07 K/uL  Basic metabolic panel  Result Value Ref Range   Sodium 135 135 - 145 mmol/L   Potassium 3.5 3.5 - 5.1 mmol/L   Chloride 99 98 - 111 mmol/L   CO2 28 22 - 32 mmol/L   Glucose, Bld 91 70 - 99 mg/dL   BUN 18 6 - 20 mg/dL   Creatinine, Ser 2.66 (H) 0.44 - 1.00 mg/dL   Calcium 8.0 (L) 8.9 - 10.3  mg/dL   GFR, Estimated 21 (L) >60 mL/min   Anion gap 8 5 - 15  Lactic acid, plasma  Result Value Ref Range   Lactic Acid, Venous 0.7 0.5 - 1.9 mmol/L  Lactic acid, plasma  Result Value Ref Range   Lactic Acid, Venous 0.9 0.5 - 1.9 mmol/L  Type and screen [facility]  Result Value Ref Range   ABO/RH(D) A POS    Antibody Screen NEG    Sample Expiration 02/28/2022,2359    Unit Number [id number]    Blood Component Type RED CELLS,LR    Unit division 00    Status of Unit ALLOCATED    Transfusion Status OK TO TRANSFUSE    Crossmatch Result Compatible    Unit Number [id number]    Blood Component Type RED CELLS,LR    Unit division 00    Status of Unit ISSUED    Transfusion Status OK TO TRANSFUSE    Crossmatch Result      Compatible Performed at [facility], [address], [place],  [address]   Prepare RBC (crossmatch)  Result Value Ref Range   Order Confirmation      ORDER PROCESSED BY BLOOD BANK Performed at [facility], [address] [place], [place] [address]   BPAM [facility]  Result Value Ref Range   Blood Product Unit Number [id number]    PRODUCT CODE [id number]    Unit Type and Rh 6200    Blood Product Expiration Date 374827078675    ISSUE DATE / TIME [id number]    Blood Product Unit Number [id number]    PRODUCT CODE [id number]    Unit Type and Rh 6200    Blood Product Expiration Date [id number]    CT PELVIS WO CONTRAST  Result Date: 02/25/2022 CLINICAL DATA:  Decubitus ulcer EXAM: CT PELVIS WITHOUT CONTRAST TECHNIQUE: Multidetector CT imaging of the pelvis was performed following the standard protocol without intravenous contrast. RADIATION DOSE REDUCTION: This exam was performed according to the departmental dose-optimization program which includes automated exposure control, adjustment of the mA and/or kV according to patient size and/or use of iterative reconstruction technique. COMPARISON:  CT abdomen/pelvis dated 08/25/2021 FINDINGS: Urinary  Tract: Bladder is decompressed by an indwelling Foley catheter. Bowel: Visualized bowel is grossly unremarkable. However, there is in ill-defined 2.7 x 4.5 cm perirectal fluid collection (  series 5/image 91) which is suspicious for anorectal abscess, new from the prior. Vascular/Lymphatic: No evidence of aneurysm. No suspicious pelvic lymphadenopathy. Reproductive: Uterus and bilateral ovaries are grossly unremarkable. Other:  No pelvic ascites. Musculoskeletal: Prior right inguinal abscess is no longer visualized. Chronic 2.2 x 5.2 cm fluid collection along the coccyx with suspected destruction/osteomyelitis involving the distal coccyx (series 5/image 46). 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 Prior fluid and gas collection laterally along the right inferior gluteal region has largely resolved (series 5/image 98). Mild decubitus ulcer along the left gluteal region overlying the left posterior acetabulum is improved (series 5/image 80). Additional decubitus ulcer along the left posterior thigh (series 5/image 115), not previously imaged), with associated chronic osteomyelitis/destruction of the left greater trochanter (series 5/image 89). IMPRESSION: Multiple decubitus ulcers with chronic osteomyelitis involving the coccyx, bilateral posterior acetabulum/right inferior pubic ramus, and left proximal femur/greater trochanter, as above. Ill-defined perirectal fluid collection, suspicious for anorectal abscess, as described above. When compared to the prior, some of this appearance may be new or newly imaged, while some of this appearance is improved. Electronically Signed   By: Julian Hy M.D.   On: 02/25/2022 20:21           Sherrell Puller, PA-C 02/26/22 0121    Elgie Congo, MD 02/26/22 1046

## 2022-02-25 NOTE — Assessment & Plan Note (Signed)
Resume Keppra 

## 2022-02-25 NOTE — ED Triage Notes (Signed)
Pt was at [facility] dialysis when her blood pressure dropped to 88/58 and dialysis was stopped prior to scheduled completion.   Pt also is known to have a pressure sore on her Buttocks. A wound vac was recently removed.
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
## 2022-02-25 NOTE — Assessment & Plan Note (Addendum)
Reported hypotension during HD-88/58. HD was terminated.  But patient reports she had almost finished the session.  Blood pressure has stabilized post PRBC transfusion.  -temporarily holding amlodipine, Lasix '20mg'$ , carvedilol 6.25, clonidine 0.1 mg twice daily for now until BP starts to rebound -restart anti-HTN meds  Excluding clonidine

## 2022-02-25 NOTE — ED Notes (Signed)
Per   hospitalist. Pt is to only get 1 unit of blood

## 2022-02-25 NOTE — Assessment & Plan Note (Addendum)
Schedule Monday Wednesday Friday. -consulted nephrology for HD treatments  -1 unit PRBC transfused, Hg up to 7.7 -last HD on 10/4

## 2022-02-26 ENCOUNTER — Encounter (HOSPITAL_COMMUNITY): Payer: Self-pay | Admitting: Internal Medicine

## 2022-02-26 DIAGNOSIS — D649 Anemia, unspecified: Secondary | ICD-10-CM | POA: Diagnosis not present

## 2022-02-26 DIAGNOSIS — L89203 Pressure ulcer of unspecified hip, stage 3: Secondary | ICD-10-CM | POA: Diagnosis not present

## 2022-02-26 DIAGNOSIS — I10 Essential (primary) hypertension: Secondary | ICD-10-CM | POA: Diagnosis not present

## 2022-02-26 DIAGNOSIS — I959 Hypotension, unspecified: Secondary | ICD-10-CM | POA: Diagnosis not present

## 2022-02-26 DIAGNOSIS — M869 Osteomyelitis, unspecified: Secondary | ICD-10-CM | POA: Diagnosis not present

## 2022-02-26 DIAGNOSIS — N186 End stage renal disease: Secondary | ICD-10-CM | POA: Diagnosis not present

## 2022-02-26 LAB — CBC
HCT: 24.6 % — ABNORMAL LOW (ref 36.0–46.0)
Hemoglobin: 7.7 g/dL — ABNORMAL LOW (ref 12.0–15.0)
MCH: 30.3 pg (ref 26.0–34.0)
MCHC: 31.3 g/dL (ref 30.0–36.0)
MCV: 96.9 fL (ref 80.0–100.0)
Platelets: 384 10*3/uL (ref 150–400)
RBC: 2.54 MIL/uL — ABNORMAL LOW (ref 3.87–5.11)
RDW: 17.8 % — ABNORMAL HIGH (ref 11.5–15.5)
WBC: 9.8 10*3/uL (ref 4.0–10.5)
nRBC: 0 % (ref 0.0–0.2)

## 2022-02-26 LAB — BASIC METABOLIC PANEL
Anion gap: 12 (ref 5–15)
BUN: 25 mg/dL — ABNORMAL HIGH (ref 6–20)
CO2: 26 mmol/L (ref 22–32)
Calcium: 8.5 mg/dL — ABNORMAL LOW (ref 8.9–10.3)
Chloride: 99 mmol/L (ref 98–111)
Creatinine, Ser: 3.84 mg/dL — ABNORMAL HIGH (ref 0.44–1.00)
GFR, Estimated: 13 mL/min — ABNORMAL LOW (ref >60)
Glucose, Bld: 111 mg/dL — ABNORMAL HIGH (ref 70–99)
Potassium: 4 mmol/L (ref 3.5–5.1)
Sodium: 137 mmol/L (ref 135–145)

## 2022-02-26 LAB — HEPATITIS B CORE ANTIBODY, TOTAL: Hep B Core Total Ab: NONREACTIVE

## 2022-02-26 LAB — HEPATITIS B SURFACE ANTIGEN: Hepatitis B Surface Ag: NONREACTIVE

## 2022-02-26 LAB — HEPATITIS C ANTIBODY: HCV Ab: NONREACTIVE

## 2022-02-26 LAB — HEPATITIS B SURFACE ANTIBODY,QUALITATIVE: Hep B S Ab: REACTIVE — AB

## 2022-02-26 MED ORDER — DARBEPOETIN ALFA 300 MCG/0.6ML IJ SOSY
300.0000 ug | PREFILLED_SYRINGE | INTRAMUSCULAR | Status: DC
  Filled 2022-02-26: qty 0.6

## 2022-02-26 MED ORDER — ONDANSETRON HCL 4 MG/2ML IJ SOLN
4.0000 mg | Freq: Four times a day (QID) | INTRAMUSCULAR | Status: DC | PRN
  Administered 2022-02-26: 4 mg via INTRAVENOUS
  Filled 2022-02-26: qty 2

## 2022-02-26 MED ORDER — ONDANSETRON HCL 4 MG PO TABS: 4.0000 mg | ORAL_TABLET | Freq: Four times a day (QID) | ORAL | Status: DC | PRN

## 2022-02-26 MED ORDER — SEVELAMER CARBONATE 800 MG PO TABS
800.0000 mg | ORAL_TABLET | Freq: Three times a day (TID) | ORAL | Status: DC
  Administered 2022-02-26 – 2022-02-27 (×4): 800 mg via ORAL
  Filled 2022-02-26 (×4): qty 1

## 2022-02-26 MED ORDER — CALCITRIOL 0.25 MCG PO CAPS
0.7500 ug | ORAL_CAPSULE | ORAL | Status: DC
  Administered 2022-02-27: 0.75 ug via ORAL
  Filled 2022-02-26: qty 3

## 2022-02-26 MED ORDER — PANTOPRAZOLE SODIUM 40 MG PO TBEC
40.0000 mg | DELAYED_RELEASE_TABLET | Freq: Every day | ORAL | Status: DC
  Administered 2022-02-26 – 2022-02-27 (×2): 40 mg via ORAL
  Filled 2022-02-26 (×2): qty 1

## 2022-02-26 MED ORDER — ACETAMINOPHEN 650 MG RE SUPP: 650.0000 mg | Freq: Four times a day (QID) | RECTAL | Status: DC | PRN

## 2022-02-26 MED ORDER — LEVETIRACETAM 500 MG PO TABS
1000.0000 mg | ORAL_TABLET | Freq: Every day | ORAL | Status: DC
  Administered 2022-02-26 – 2022-02-27 (×2): 1000 mg via ORAL
  Filled 2022-02-26 (×2): qty 2

## 2022-02-26 MED ORDER — CHLORHEXIDINE GLUCONATE CLOTH 2 % EX PADS: 6.0000 | MEDICATED_PAD | Freq: Every day | CUTANEOUS | Status: DC

## 2022-02-26 MED ORDER — POLYETHYLENE GLYCOL 3350 17 G PO PACK: 17.0000 g | PACK | Freq: Every day | ORAL | Status: DC | PRN

## 2022-02-26 MED ORDER — VANCOMYCIN HCL 500 MG/100ML IV SOLN
500.0000 mg | INTRAVENOUS | Status: DC
  Filled 2022-02-26: qty 100

## 2022-02-26 MED ORDER — CALCIUM ACETATE (PHOS BINDER) 667 MG PO CAPS
1334.0000 mg | ORAL_CAPSULE | Freq: Three times a day (TID) | ORAL | Status: DC
  Administered 2022-02-26 – 2022-02-27 (×4): 1334 mg via ORAL
  Filled 2022-02-26 (×4): qty 2

## 2022-02-26 MED ORDER — ACETAMINOPHEN 325 MG PO TABS
650.0000 mg | ORAL_TABLET | Freq: Four times a day (QID) | ORAL | Status: DC | PRN
  Administered 2022-02-26: 650 mg via ORAL
  Filled 2022-02-26: qty 2
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
## 2022-02-26 NOTE — Consult Note (Addendum)
[facility] Consult  Reason for Consult: Ill-defined perirectal fluid collection Referring Physician: Dr. Denton Brick  Chief Complaint   Wound Check     HPI: [name] is a 55 y.o. female who presented to the hospital from dialysis for hypotension.  She was admitted to the hospital with anemia requiring blood transfusion.  She has chronic right and left ischial wounds for which she has been receiving treatment at [facility].  She has been noted to have a fluid collection associated with her sacrum and coccyx, for which IR was going to attempt drainage per wound care notes.  She was on IV antibiotics from 10/24/21-01/15/22 for osteomyelitis of multiple areas and multiple abscesses requiring drainage.  She received her antibiotics during dialysis.  There were plans for possible repeat MRI of the pelvis to further evaluate fluid collections and osteomyelitis per both ID and wound care notes this past September.  In the ED, she underwent CT abdomen and pelvis which demonstrated multiple decubitus ulcers with chronic osteomyelitis of the coccyx, bilateral posterior acetabulum/right inferior pubic ramus, and left posterior femur/greater trochanter, and ill-defined perirectal fluid collection suspicious for anal rectal abscess.  She did not have a leukocytosis, WBC 8.  She is afebrile with otherwise normal vitals.  Lactic acid was also noted to be normal.  Hemoglobin had down trended to 6, so decision was made to admit the patient to the hospital for transfusion.  She was started on IV cefepime, metronidazole, and vancomycin.  Patient states that she is tired, similar to how she presented yesterday.  She is a paraplegic, so she has no sensation over her buttocks or around her rectum.  She has a suprapubic catheter for urination, and she has bowel movements is normal.  She denies any abnormalities with bowel movements, including blood or pus.  She denies any fevers or  chills.  Past Medical History:  Diagnosis Date   Abnormal uterine bleeding (AUB) 06/15/2014   Cancer (HCC)    uterine   High blood pressure    Paraplegia (lower)    Seizure disorder (HCC)    Seizures ([place])    Suprapubic catheter ([place])    Urinary tract infection     Past Surgical History:  Procedure Laterality Date   APPLICATION OF WOUND VAC Right 09/13/2021   (approximately 1-88mo ago) pressure sore on right hip   BACK SURGERY     Pt stated "before 2000"   ESOPHAGOGASTRODUODENOSCOPY N/A 09/20/2015   Procedure: ESOPHAGOGASTRODUODENOSCOPY (EGD);  Surgeon: NRogene Houston MD;  [facility];  Service: Endoscopy;  Laterality: N/A;  730   IR CATHETER TUBE CHANGE  04/02/2018   PERCUTANEOUS ENDOSCOPIC GASTROSTOMY (PEG) REMOVAL N/A 09/20/2015   Procedure: PERCUTANEOUS ENDOSCOPIC GASTROSTOMY (PEG) REMOVAL;  Surgeon: NRogene Houston MD;  Location: [facility];  Service: Endoscopy;  Laterality: N/A;    Family History  Problem Relation Age of Onset   Cancer Mother    Hypertension Mother    Cancer Sister        breast and then spread everywhere.   Diabetes Paternal Grandmother    Hypertension Paternal Grandmother     Social History   Tobacco Use   Smoking status: Never   Smokeless tobacco: Never  Vaping Use   Vaping Use: Never used  Substance Use Topics   Alcohol use: Yes    Comment: Occassionally   Drug use: No    Medications: I have reviewed the patient's current medications.  Allergies  Allergen Reactions  Benadryl [Diphenhydramine Hcl (Sleep)] Hives   Quinine Derivatives Other (See Comments)    Alters mental status   Vancomycin     Pt is tolerating this medication at HD   Azithromycin Itching and Rash   Tetracycline Itching    Able to tolerate Doxycycline.    Zosyn [Piperacillin Sod-Tazobactam So] Rash     ROS:  Pertinent items are noted in HPI.  Blood pressure (!) 145/65, pulse 79, temperature 98.9 F (37.2 C), temperature source Oral,  resp. rate (!) 21, height '5\' 4"'$  (1.626 m), weight 51.8 kg, SpO2 100 %. Physical Exam Vitals reviewed.  Constitutional:      General: She is not in acute distress.    Appearance: Normal appearance.  HENT:     Head: Normocephalic and atraumatic.  Eyes:     Extraocular Movements: Extraocular movements intact.     Pupils: Pupils are equal, round, and reactive to light.  Cardiovascular:     Rate and Rhythm: Normal rate.  Pulmonary:     Effort: Pulmonary effort is normal.  Abdominal:     General: There is no distension.     Palpations: Abdomen is soft.     Tenderness: There is no abdominal tenderness.     Comments: Suprapubic catheter in place  Genitourinary:    Comments: Anus with decreased sphincter tone, no areas of fluctuance, no purulent drainage or overlying erythema; perineum without any evidence of erythema or fluctuance, physiologic vaginal discharge Musculoskeletal:     Cervical back: Normal range of motion.     Comments: Contractured bilateral lower extremities  Skin:    Comments: Right ischial wound with granulation tissue at the base, small amount of fibrinous exudate, no evidence of infection; left ischial wound with small amount of fibrinous exudate and granulation tissue, no evidence of infection; palpable fluid collection overlying sacrum without open wound  Neurological:     Mental Status: She is alert and oriented to person, place, and time.  Psychiatric:        Mood and Affect: Mood normal.        Behavior: Behavior normal.          Results: Results for orders placed or performed during the hospital encounter of 02/25/22 (from the past 48 hour(s))  CBC with Differential     Status: Abnormal   Collection Time: 02/25/22  6:16 PM  Result Value Ref Range   WBC 8.0 4.0 - 10.5 K/uL   RBC 1.97 (L) 3.87 - 5.11 MIL/uL   Hemoglobin 6.0 (LL) 12.0 - 15.0 g/dL    Comment: REPEATED TO VERIFY THIS CRITICAL RESULT HAS VERIFIED AND BEEN CALLED TO J LONG RN BY KIRSTENE  FORSYTH ON 10 02 2023 AT [place], AND HAS BEEN READ BACK.     HCT 19.7 (L) 36.0 - 46.0 %   MCV 100.0 80.0 - 100.0 fL   MCH 30.5 26.0 - 34.0 pg   MCHC 30.5 30.0 - 36.0 g/dL   RDW 16.0 (H) 11.5 - 15.5 %   Platelets 399 150 - 400 K/uL   nRBC 0.0 0.0 - 0.2 %   Neutrophils Relative % 66 %   Neutro Abs 5.3 1.7 - 7.7 K/uL   Lymphocytes Relative 22 %   Lymphs Abs 1.8 0.7 - 4.0 K/uL   Monocytes Relative 7 %   Monocytes Absolute 0.6 0.1 - 1.0 K/uL   Eosinophils Relative 3 %   Eosinophils Absolute 0.3 0.0 - 0.5 K/uL   Basophils Relative 1 %   Basophils  Absolute 0.0 0.0 - 0.1 K/uL   Immature Granulocytes 1 %   Abs Immature Granulocytes 0.07 0.00 - 0.07 K/uL    Comment: Performed at [facility], [address], [place], [place] [address]  Basic metabolic panel     Status: Abnormal   Collection Time: 02/25/22  6:16 PM  Result Value Ref Range   Sodium 135 135 - 145 mmol/L   Potassium 3.5 3.5 - 5.1 mmol/L   Chloride 99 98 - 111 mmol/L   CO2 28 22 - 32 mmol/L   Glucose, Bld 91 70 - 99 mg/dL    Comment: Glucose reference range applies only to samples taken after fasting for at least 8 hours.   BUN 18 6 - 20 mg/dL   Creatinine, Ser 2.66 (H) 0.44 - 1.00 mg/dL   Calcium 8.0 (L) 8.9 - 10.3 mg/dL   GFR, Estimated 21 (L) >60 mL/min    Comment: (NOTE) Calculated using the CKD-EPI Creatinine Equation (2021)    Anion gap 8 5 - 15    Comment: Performed at [facility], [address] [place], [place] [address]  Blood culture (routine x 2)     Status: None (Preliminary result)   Collection Time: 02/25/22  6:16 PM   Specimen: BLOOD RIGHT ARM  Result Value Ref Range   Specimen Description BLOOD RIGHT ARM    Special Requests      BOTTLES DRAWN AEROBIC AND ANAEROBIC Blood Culture adequate volume   Culture      NO GROWTH < 12 HOURS Performed at [facility], [address], [place], [place] [address]    Report Status PENDING   Blood culture (routine x 2)     Status: None (Preliminary result)    Collection Time: 02/25/22  6:16 PM   Specimen: BLOOD RIGHT ARM  Result Value Ref Range   Specimen Description BLOOD RIGHT ARM    Special Requests      BOTTLES DRAWN AEROBIC AND ANAEROBIC Blood Culture adequate volume   Culture      NO GROWTH < 12 HOURS Performed at [facility], [address] [place], [place] [address]    Report Status PENDING   Lactic acid, plasma     Status: None   Collection Time: 02/25/22  6:16 PM  Result Value Ref Range   Lactic Acid, Venous 0.7 0.5 - 1.9 mmol/L    Comment: Performed at [facility], [address], [place], [place] [address]  Type and screen [facility]     Status: None (Preliminary result)   Collection Time: 02/25/22  7:40 PM  Result Value Ref Range   ABO/RH(D) A POS    Antibody Screen NEG    Sample Expiration 02/28/2022,2359    Unit Number [id number]    Blood Component Type RED CELLS,LR    Unit division 00    Status of Unit ALLOCATED    Transfusion Status OK TO TRANSFUSE    Crossmatch Result Compatible    Unit Number [id number]    Blood Component Type RED CELLS,LR    Unit division 00    Status of Unit ISSUED,FINAL    Transfusion Status OK TO TRANSFUSE    Crossmatch Result      Compatible Performed at [facility], [address], [place],  [address]   Lactic acid, plasma     Status: None   Collection Time: 02/25/22  8:00 PM  Result Value Ref Range   Lactic Acid, Venous 0.9 0.5 - 1.9 mmol/L  Comment: Performed at [facility], [address] [place], [place] [address]  Prepare RBC (crossmatch)     Status: None   Collection Time: 02/25/22  8:06 PM  Result Value Ref Range   Order Confirmation      ORDER PROCESSED BY BLOOD BANK Performed at [facility], [address], [place], [place] [address]   Basic metabolic panel     Status: Abnormal   Collection Time: 02/26/22  4:19 AM  Result Value Ref Range   Sodium 137 135 - 145 mmol/L   Potassium 4.0 3.5 - 5.1 mmol/L   Chloride 99 98 - 111 mmol/L   CO2 26 22  - 32 mmol/L   Glucose, Bld 111 (H) 70 - 99 mg/dL    Comment: Glucose reference range applies only to samples taken after fasting for at least 8 hours.   BUN 25 (H) 6 - 20 mg/dL   Creatinine, Ser 3.84 (H) 0.44 - 1.00 mg/dL    Comment: DELTA CHECK NOTED   Calcium 8.5 (L) 8.9 - 10.3 mg/dL   GFR, Estimated 13 (L) >60 mL/min    Comment: (NOTE) Calculated using the CKD-EPI Creatinine Equation (2021)    Anion gap 12 5 - 15    Comment: Performed at [facility], [address] [place], [place] [address]  CBC     Status: Abnormal   Collection Time: 02/26/22  4:19 AM  Result Value Ref Range   WBC 9.8 4.0 - 10.5 K/uL   RBC 2.54 (L) 3.87 - 5.11 MIL/uL   Hemoglobin 7.7 (L) 12.0 - 15.0 g/dL    Comment: REPEATED TO VERIFY POST TRANSFUSION SPECIMEN DELTA CHECK NOTED    HCT 24.6 (L) 36.0 - 46.0 %   MCV 96.9 80.0 - 100.0 fL   MCH 30.3 26.0 - 34.0 pg   MCHC 31.3 30.0 - 36.0 g/dL   RDW 17.8 (H) 11.5 - 15.5 %   Platelets 384 150 - 400 K/uL   nRBC 0.0 0.0 - 0.2 %    Comment: Performed at [facility], [address], [place], [place] [address]    CT PELVIS WO CONTRAST  Result Date: 02/25/2022 CLINICAL DATA:  Decubitus ulcer EXAM: CT PELVIS WITHOUT CONTRAST TECHNIQUE: Multidetector CT imaging of the pelvis was performed following the standard protocol without intravenous contrast. RADIATION DOSE REDUCTION: This exam was performed according to the departmental dose-optimization program which includes automated exposure control, adjustment of the mA and/or kV according to patient size and/or use of iterative reconstruction technique. COMPARISON:  CT abdomen/pelvis dated 08/25/2021 FINDINGS: Urinary Tract: Bladder is decompressed by an indwelling Foley catheter. Bowel: Visualized bowel is grossly unremarkable. However, there is in ill-defined 2.7 x 4.5 cm perirectal fluid collection (series 5/image 91) which is suspicious for anorectal abscess, new from the prior. Vascular/Lymphatic: No evidence of aneurysm.  No suspicious pelvic lymphadenopathy. Reproductive: Uterus and bilateral ovaries are grossly unremarkable. Other:  No pelvic ascites. Musculoskeletal: Prior right inguinal abscess is no longer visualized. Chronic 2.2 x 5.2 cm fluid collection along the coccyx with suspected destruction/osteomyelitis involving the distal coccyx (series 5/image 46). 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 Prior fluid and gas collection laterally along the right inferior gluteal region has largely resolved (series 5/image 98). Mild decubitus ulcer along the left gluteal region overlying the left posterior acetabulum is improved (series 5/image 80). Additional decubitus ulcer along the left posterior thigh (series 5/image 115), not previously imaged),  with associated chronic osteomyelitis/destruction of the left greater trochanter (series 5/image 89). IMPRESSION: Multiple decubitus ulcers with chronic osteomyelitis involving the coccyx, bilateral posterior acetabulum/right inferior pubic ramus, and left proximal femur/greater trochanter, as above. Ill-defined perirectal fluid collection, suspicious for anorectal abscess, as described above. When compared to the prior, some of this appearance may be new or newly imaged, while some of this appearance is improved. Electronically Signed   By: Julian Hy M.D.   On: 02/25/2022 20:21     Assessment & Plan:  [name] is a 56 y.o. female who was admitted with hypotension with HD, and anemia requiring transfusion.  General surgery consulted to evaluate for need for drainage of ill-defined perirectal fluid collection noted on CT scan.  WBC 8, lactic acid 0.9, afebrile, vital signs stable.  Imaging and blood work evaluated by myself.  -Imaging evaluated with Dr. Thornton Papas in radiology.  There is some inflammation to the posterior aspect of the rectum with perirectal  inflammatory changes inferior to the anus, but no discrete fluid collection noted -Patient without fluctuance or erythema on physical examination -Given patient's presentation does not demonstrate concerns for infection (WBC 8, afebrile, not tachycardic) and no significant concerning physical exam findings, recommend against any surgical procedures at this time -Patient currently has follow-up scheduled with her wound care doctor on 10/12.  Recommend keeping this appointment, and if there is concern for progression/worsening of this area, would consider repeat imaging with either CT or MRI to fully evaluate need for intervention -Would hold off on antibiotics at this time, as patient does not have clinical signs of infection -Continue with local wound care to right and left ischial wounds.  No need for debridement at this time. Again, defer to wound care doctor for management -Care per primary team -Please call with any questions or concerns  All questions were answered to the satisfaction of the patient.   -- Graciella Freer, DO [facility] [address] [place], [place] [address] [phone] (office)

## 2022-02-26 NOTE — Progress Notes (Signed)
  Transition of Care (TOC) Screening Note   Patient Details  Name: [name] Date of Birth: 1966/01/25   Transition of Care [facility]) CM/SW Contact:    Ihor Gully, LCSW Phone Number: 02/26/2022, 2:48 PM    Transition of Care Department [facility]) has reviewed patient and no TOC needs have been identified at this time. We will continue to monitor patient advancement through interdisciplinary progression rounds. If new patient transition needs arise, please place a TOC consult.
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
## 2022-02-26 NOTE — Consult Note (Signed)
Reason for Consult: To manage dialysis and dialysis related needs Referring Physician: Alec Mcphee is an 56 y.o. female with paraplegia and TBI, HTN, as well as ESRD-  MWF.  She also has chronic bilat hip wounds history of wound VAC in recent past.  Patient was sent to the hospital yesterday for a BP drop in HD but then discovered to have possible expansion of these wounds and possible anorectal abscess-  was admitted and placed on IV abx-  surgery to see.  Also found to have a hgb of 6.  BP has been adequate here-  she was on antihypertensives as OP that have been held. She just complains of being tired     Dialyzes at [facility]  MWF 3 hours  EDW 50.5 kg. HD Bath 3/2.5, Dialyzer unknown, Heparin no. Access L AVF  400 BFR, 500 DFR  calcitriol 0.75 TIW and mircera 60 q 2 weeks.  Past Medical History:  Diagnosis Date   Abnormal uterine bleeding (AUB) 06/15/2014   Cancer (HCC)    uterine   High blood pressure    Paraplegia (lower)    Seizure disorder (HCC)    Seizures ([place])    Suprapubic catheter ([place])    Urinary tract infection     Past Surgical History:  Procedure Laterality Date   APPLICATION OF WOUND VAC Right 09/13/2021   (approximately 1-24mo ago) pressure sore on right hip   BACK SURGERY     Pt stated "before 2000"   ESOPHAGOGASTRODUODENOSCOPY N/A 09/20/2015   Procedure: ESOPHAGOGASTRODUODENOSCOPY (EGD);  Surgeon: NRogene Houston MD;  [facility];  Service: Endoscopy;  Laterality: N/A;  730   IR CATHETER TUBE CHANGE  04/02/2018   PERCUTANEOUS ENDOSCOPIC GASTROSTOMY (PEG) REMOVAL N/A 09/20/2015   Procedure: PERCUTANEOUS ENDOSCOPIC GASTROSTOMY (PEG) REMOVAL;  Surgeon: NRogene Houston MD;  [facility];  Service: Endoscopy;  Laterality: N/A;    Family History  Problem Relation Age of Onset   Cancer Mother    Hypertension Mother    Cancer Sister        breast and then spread everywhere.   Diabetes Paternal Grandmother     Hypertension Paternal Grandmother     Social History:  reports that she has never smoked. She has never used smokeless tobacco. She reports current alcohol use. She reports that she does not use drugs.  Allergies:  Allergies  Allergen Reactions   Benadryl [Diphenhydramine Hcl (Sleep)] Hives   Quinine Derivatives Other (See Comments)    Alters mental status   Vancomycin     Pt is tolerating this medication at HD   Azithromycin Itching and Rash   Tetracycline Itching    Able to tolerate Doxycycline.    Zosyn [Piperacillin Sod-Tazobactam So] Rash    Medications: I have reviewed the patient's current medications.  Results for orders placed or performed during the hospital encounter of 02/25/22 (from the past 48 hour(s))  CBC with Differential     Status: Abnormal   Collection Time: 02/25/22  6:16 PM  Result Value Ref Range   WBC 8.0 4.0 - 10.5 K/uL   RBC 1.97 (L) 3.87 - 5.11 MIL/uL   Hemoglobin 6.0 (LL) 12.0 - 15.0 g/dL    Comment: REPEATED TO VERIFY THIS CRITICAL RESULT HAS VERIFIED AND BEEN CALLED TO J LONG RN BY KIRSTENE FORSYTH ON 10 02 2023 AT [place] AND HAS BEEN READ BACK.     HCT 19.7 (L) 36.0 - 46.0 %   MCV  100.0 80.0 - 100.0 fL   MCH 30.5 26.0 - 34.0 pg   MCHC 30.5 30.0 - 36.0 g/dL   RDW 16.0 (H) 11.5 - 15.5 %   Platelets 399 150 - 400 K/uL   nRBC 0.0 0.0 - 0.2 %   Neutrophils Relative % 66 %   Neutro Abs 5.3 1.7 - 7.7 K/uL   Lymphocytes Relative 22 %   Lymphs Abs 1.8 0.7 - 4.0 K/uL   Monocytes Relative 7 %   Monocytes Absolute 0.6 0.1 - 1.0 K/uL   Eosinophils Relative 3 %   Eosinophils Absolute 0.3 0.0 - 0.5 K/uL   Basophils Relative 1 %   Basophils Absolute 0.0 0.0 - 0.1 K/uL   Immature Granulocytes 1 %   Abs Immature Granulocytes 0.07 0.00 - 0.07 K/uL    Comment: Performed at [facility], [address] [place], [place] [address]  Basic metabolic panel     Status: Abnormal   Collection Time: 02/25/22  6:16 PM  Result Value Ref Range   Sodium 135 135 - 145  mmol/L   Potassium 3.5 3.5 - 5.1 mmol/L   Chloride 99 98 - 111 mmol/L   CO2 28 22 - 32 mmol/L   Glucose, Bld 91 70 - 99 mg/dL    Comment: Glucose reference range applies only to samples taken after fasting for at least 8 hours.   BUN 18 6 - 20 mg/dL   Creatinine, Ser 2.66 (H) 0.44 - 1.00 mg/dL   Calcium 8.0 (L) 8.9 - 10.3 mg/dL   GFR, Estimated 21 (L) >60 mL/min    Comment: (NOTE) Calculated using the CKD-EPI Creatinine Equation (2021)    Anion gap 8 5 - 15    Comment: Performed at [facility], [address] [place], [place] [address]  Blood culture (routine x 2)     Status: None (Preliminary result)   Collection Time: 02/25/22  6:16 PM   Specimen: BLOOD RIGHT ARM  Result Value Ref Range   Specimen Description BLOOD RIGHT ARM    Special Requests      BOTTLES DRAWN AEROBIC AND ANAEROBIC Blood Culture adequate volume   Culture      NO GROWTH < 12 HOURS Performed at [facility], [address] [place], [place] [address]    Report Status PENDING   Blood culture (routine x 2)     Status: None (Preliminary result)   Collection Time: 02/25/22  6:16 PM   Specimen: BLOOD RIGHT ARM  Result Value Ref Range   Specimen Description BLOOD RIGHT ARM    Special Requests      BOTTLES DRAWN AEROBIC AND ANAEROBIC Blood Culture adequate volume   Culture      NO GROWTH < 12 HOURS Performed at [facility], [address] [place], [place] [address]    Report Status PENDING   Lactic acid, plasma     Status: None   Collection Time: 02/25/22  6:16 PM  Result Value Ref Range   Lactic Acid, Venous 0.7 0.5 - 1.9 mmol/L    Comment: Performed at [facility], [address], [place], [place] [address]  Type and screen [facility]     Status: None (Preliminary result)   Collection Time: 02/25/22  7:40 PM  Result Value Ref Range   ABO/RH(D) A POS    Antibody Screen NEG    Sample Expiration 02/28/2022,2359    Unit Number [id number]    Blood Component Type RED CELLS,LR    Unit division  00    Status of Unit ALLOCATED    Transfusion Status OK TO TRANSFUSE    Crossmatch Result Compatible    Unit Number [id number]    Blood Component Type RED CELLS,LR    Unit division 00    Status of Unit ISSUED,FINAL    Transfusion Status OK TO TRANSFUSE    Crossmatch Result      Compatible Performed at [facility], [address], [place], [place] [address]   Lactic acid, plasma     Status: None   Collection Time: 02/25/22  8:00 PM  Result Value Ref Range   Lactic Acid, Venous 0.9 0.5 - 1.9 mmol/L    Comment: Performed at [facility], [address] [place], [place] [address]  Prepare RBC (crossmatch)     Status: None   Collection Time: 02/25/22  8:06 PM  Result Value Ref Range   Order Confirmation      ORDER PROCESSED BY BLOOD BANK Performed at [facility], [address] [place], [place] [address]   Basic metabolic panel     Status: Abnormal   Collection Time: 02/26/22  4:19 AM  Result Value Ref Range   Sodium 137 135 - 145 mmol/L   Potassium 4.0 3.5 - 5.1 mmol/L   Chloride 99 98 - 111 mmol/L   CO2 26 22 - 32 mmol/L   Glucose, Bld 111 (H) 70 - 99 mg/dL    Comment: Glucose reference range applies only to samples taken after fasting for at least 8 hours.   BUN 25 (H) 6 - 20 mg/dL   Creatinine, Ser 3.84 (H) 0.44 - 1.00 mg/dL    Comment: DELTA CHECK NOTED   Calcium 8.5 (L) 8.9 - 10.3 mg/dL   GFR, Estimated 13 (L) >60 mL/min    Comment: (NOTE) Calculated using the CKD-EPI Creatinine Equation (2021)    Anion gap 12 5 - 15    Comment: Performed at [facility], [address] [place], [place] [address]  CBC     Status: Abnormal   Collection Time: 02/26/22  4:19 AM  Result Value Ref Range   WBC 9.8 4.0 - 10.5 K/uL   RBC 2.54 (L) 3.87 - 5.11 MIL/uL   Hemoglobin 7.7 (L) 12.0 - 15.0 g/dL    Comment: REPEATED TO VERIFY POST TRANSFUSION SPECIMEN DELTA CHECK NOTED    HCT 24.6 (L) 36.0 - 46.0 %   MCV 96.9 80.0 - 100.0 fL   MCH 30.3 26.0 - 34.0 pg   MCHC 31.3 30.0 -  36.0 g/dL   RDW 17.8 (H) 11.5 - 15.5 %   Platelets 384 150 - 400 K/uL   nRBC 0.0 0.0 - 0.2 %    Comment: Performed at [facility], [address] [place], [place] [address]    CT PELVIS WO CONTRAST  Result Date: 02/25/2022 CLINICAL DATA:  Decubitus ulcer EXAM: CT PELVIS WITHOUT CONTRAST TECHNIQUE: Multidetector CT imaging of the pelvis was performed following the standard protocol without intravenous contrast. RADIATION DOSE REDUCTION: This exam was performed according to the departmental dose-optimization program which includes automated exposure control, adjustment of the mA and/or kV according to patient size and/or use of iterative reconstruction technique. COMPARISON:  CT abdomen/pelvis dated 08/25/2021 FINDINGS: Urinary Tract: Bladder is decompressed by an indwelling Foley catheter. Bowel: Visualized bowel is grossly unremarkable. However, there is in ill-defined 2.7 x 4.5 cm perirectal fluid collection (series 5/image 91) which is suspicious for anorectal abscess, new from the prior. Vascular/Lymphatic: No evidence of aneurysm. No  suspicious pelvic lymphadenopathy. Reproductive: Uterus and bilateral ovaries are grossly unremarkable. Other:  No pelvic ascites. Musculoskeletal: Prior right inguinal abscess is no longer visualized. Chronic 2.2 x 5.2 cm fluid collection along the coccyx with suspected destruction/osteomyelitis involving the distal coccyx (series 5/image 46). 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 Prior fluid and gas collection laterally along the right inferior gluteal region has largely resolved (series 5/image 98). Mild decubitus ulcer along the left gluteal region overlying the left posterior acetabulum is improved (series 5/image 80). Additional decubitus ulcer along the left posterior thigh (series 5/image 115), not previously imaged), with associated chronic  osteomyelitis/destruction of the left greater trochanter (series 5/image 89). IMPRESSION: Multiple decubitus ulcers with chronic osteomyelitis involving the coccyx, bilateral posterior acetabulum/right inferior pubic ramus, and left proximal femur/greater trochanter, as above. Ill-defined perirectal fluid collection, suspicious for anorectal abscess, as described above. When compared to the prior, some of this appearance may be new or newly imaged, while some of this appearance is improved. Electronically Signed   By: Julian Hy M.D.   On: 02/25/2022 20:21    ROS: only c/o being tired Blood pressure (!) 145/65, pulse 79, temperature 98.9 F (37.2 C), temperature source Oral, resp. rate (!) 21, height '5\' 4"'$  (1.626 m), weight 51.8 kg, SpO2 100 %. General appearance: alert and cooperative Resp: clear to auscultation bilaterally Cardio: regular rate and rhythm, S1, S2 normal, no murmur, click, rub or gallop GI: soft, non-tender; bowel sounds normal; no masses,  no organomegaly Extremities: bilat wounds as described in other notes-  some LE edema is present  Left upper arm AVF-  patent  Assessment/Plan: 56 year old hispanic female with ESRD-  admitted for low BP with HD that was transient but now found to have possibly more significant wound findings req antibiotics at least and also profound anemia  1 bilat hip wounds-  per primary and surgery-  started on abx-  unknown if will need surgical management at this point  2 ESRD: normally MWF as OP vis AVF-  tentatively plan for HD tomorrow on schedule but have many ESRDs in house right now so schedule may need to be modified  3 Hypertension/vol: reportedly had BP drop in HD yesterday-  was on multiple antihypertensives-  holding right now-  UF and meds as needed  4. Anemia of ESRD: was only on 45 mircera as OP -  not a big dose and usually we follow hgb pretty lcosely-  no obvious source of bleeding-  giving blood-  total of 3 units so far-  will inc  ESA and check iron stores 5. Metabolic Bone Disease: will cont OP calcitriol -  also on renvela   Islam Eichinger A Shanina Kepple 02/26/2022, 10:59 AM

## 2022-02-26 NOTE — Discharge Instructions (Signed)
-  Recommend you attend your previously scheduled appointment on 10/12 with your wound care physician for continued evaluation of wounds and perirectal inflammation

## 2022-02-27 DIAGNOSIS — N186 End stage renal disease: Secondary | ICD-10-CM | POA: Diagnosis not present

## 2022-02-27 DIAGNOSIS — D649 Anemia, unspecified: Secondary | ICD-10-CM | POA: Diagnosis not present

## 2022-02-27 DIAGNOSIS — I959 Hypotension, unspecified: Secondary | ICD-10-CM | POA: Diagnosis not present

## 2022-02-27 DIAGNOSIS — G8220 Paraplegia, unspecified: Secondary | ICD-10-CM | POA: Diagnosis not present

## 2022-02-27 DIAGNOSIS — M869 Osteomyelitis, unspecified: Secondary | ICD-10-CM | POA: Diagnosis not present

## 2022-02-27 LAB — RENAL FUNCTION PANEL
Albumin: 2.4 g/dL — ABNORMAL LOW (ref 3.5–5.0)
Anion gap: 13 (ref 5–15)
BUN: 40 mg/dL — ABNORMAL HIGH (ref 6–20)
CO2: 25 mmol/L (ref 22–32)
Calcium: 9 mg/dL (ref 8.9–10.3)
Chloride: 99 mmol/L (ref 98–111)
Creatinine, Ser: 5.43 mg/dL — ABNORMAL HIGH (ref 0.44–1.00)
GFR, Estimated: 9 mL/min — ABNORMAL LOW (ref >60)
Glucose, Bld: 99 mg/dL (ref 70–99)
Phosphorus: 3 mg/dL (ref 2.5–4.6)
Potassium: 4.7 mmol/L (ref 3.5–5.1)
Sodium: 137 mmol/L (ref 135–145)

## 2022-02-27 LAB — CBC
HCT: 25.2 % — ABNORMAL LOW (ref 36.0–46.0)
Hemoglobin: 7.8 g/dL — ABNORMAL LOW (ref 12.0–15.0)
MCH: 30.1 pg (ref 26.0–34.0)
MCHC: 31 g/dL (ref 30.0–36.0)
MCV: 97.3 fL (ref 80.0–100.0)
Platelets: 405 10*3/uL — ABNORMAL HIGH (ref 150–400)
RBC: 2.59 MIL/uL — ABNORMAL LOW (ref 3.87–5.11)
RDW: 17.2 % — ABNORMAL HIGH (ref 11.5–15.5)
WBC: 10.2 10*3/uL (ref 4.0–10.5)
nRBC: 0 % (ref 0.0–0.2)

## 2022-02-27 LAB — HEPATITIS B SURFACE ANTIBODY, QUANTITATIVE: Hep B S AB Quant (Post): 446.4 m[IU]/mL (ref >9.9)

## 2022-02-27 MED ORDER — DARBEPOETIN ALFA 200 MCG/0.4ML IJ SOSY
PREFILLED_SYRINGE | INTRAMUSCULAR | Status: AC
  Administered 2022-02-27: 300 ug via INTRAVENOUS
  Filled 2022-02-27: qty 0.4

## 2022-02-27 MED ORDER — DARBEPOETIN ALFA 100 MCG/0.5ML IJ SOSY
PREFILLED_SYRINGE | INTRAMUSCULAR | Status: AC
  Filled 2022-02-27: qty 0.5

## 2022-02-27 NOTE — Progress Notes (Signed)
Telemetry discontinued per order

## 2022-02-27 NOTE — Progress Notes (Addendum)
Received patient in bed to unit.  Alert and oriented.  Informed consent signed and in chart.   Treatment initiated: 1020 Treatment completed: 1320  Patient tolerated well.  Transported back to the room  Alert, without acute distress.  Hand-off given to patient's nurse.   Access used: AVF Access issues: none  Total UF removed: 1.5 L Medication(s) given: Aranesp 300 mcg Post HD VS: 156/60 P 80 R 20 Post HD weight: 50 kg   [facility]

## 2022-02-27 NOTE — Care Management Obs Status (Signed)
[facility] NOTIFICATION   Patient Details  Name: [name] MRN: [id number] Date of Birth: May 11, 1966   Medicare Observation Status Notification Given:  Yes    Tommy Medal 02/27/2022, 10:48 AM

## 2022-02-27 NOTE — Care Management Obs Status (Signed)
[facility] NOTIFICATION   Patient Details  Name: [name] MRN: [id number] Date of Birth: 1965-08-23   Medicare Observation Status Notification Given:       Tommy Medal 02/27/2022, 10:48 AM
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
## 2022-02-27 NOTE — Progress Notes (Signed)
Patient ID: [name], female   DOB: [birth year], 56 y.o.   MRN: [id number] S: Feels tired today but otherwise, no new complaints. O:BP (!) 157/70 (BP Location: Right Arm)   Pulse 82   Temp 98.3 F (36.8 C)   Resp 20   Ht '5\' 4"'$  (1.626 m)   Wt 51.8 kg   LMP  (LMP Unknown)   SpO2 99%   BMI 19.60 kg/m   Intake/Output Summary (Last 24 hours) at 02/27/2022 1013 Last data filed at 02/26/2022 1700 Gross per 24 hour  Intake 480 ml  Output 400 ml  Net 80 ml   Intake/Output: I/O last 3 completed shifts: In: 680 [P.O.:480; IV Piggyback:200] Out: 400 [Urine:400]  Intake/Output this shift:  No intake/output data recorded. Weight change: -11.7 kg Gen: NAD CVS: RRR Resp:CTA Abd: +BS, soft, NT/ND Ext: no edema, LUE AVF +T/B  Recent Labs  Lab 02/25/22 1816 02/26/22 0419 02/27/22 0431  NA 135 137 137  K 3.5 4.0 4.7  CL 99 99 99  CO2 '28 26 25  '$ GLUCOSE 91 111* 99  BUN 18 25* 40*  CREATININE 2.66* 3.84* 5.43*  ALBUMIN  --   --  2.4*  CALCIUM 8.0* 8.5* 9.0  PHOS  --   --  3.0   Liver Function Tests: Recent Labs  Lab 02/27/22 0431  ALBUMIN 2.4*   No results for input(s): "LIPASE", "AMYLASE" in the last 168 hours. No results for input(s): "AMMONIA" in the last 168 hours. CBC: Recent Labs  Lab 02/25/22 1816 02/26/22 0419 02/27/22 0431  WBC 8.0 9.8 10.2  NEUTROABS 5.3  --   --   HGB 6.0* 7.7* 7.8*  HCT 19.7* 24.6* 25.2*  MCV 100.0 96.9 97.3  PLT 399 384 405*   Cardiac Enzymes: No results for input(s): "CKTOTAL", "CKMB", "CKMBINDEX", "TROPONINI" in the last 168 hours. CBG: No results for input(s): "GLUCAP" in the last 168 hours.  Iron Studies: No results for input(s): "IRON", "TIBC", "TRANSFERRIN", "FERRITIN" in the last 72 hours. Studies/Results: CT PELVIS WO CONTRAST  Result Date: 02/25/2022 CLINICAL DATA:  Decubitus ulcer EXAM: CT PELVIS WITHOUT CONTRAST TECHNIQUE: Multidetector CT imaging of the pelvis was performed following the standard protocol without  intravenous contrast. RADIATION DOSE REDUCTION: This exam was performed according to the departmental dose-optimization program which includes automated exposure control, adjustment of the mA and/or kV according to patient size and/or use of iterative reconstruction technique. COMPARISON:  CT abdomen/pelvis dated 08/25/2021 FINDINGS: Urinary Tract: Bladder is decompressed by an indwelling Foley catheter. Bowel: Visualized bowel is grossly unremarkable. However, there is in ill-defined 2.7 x 4.5 cm perirectal fluid collection (series 5/image 91) which is suspicious for anorectal abscess, new from the prior. Vascular/Lymphatic: No evidence of aneurysm. No suspicious pelvic lymphadenopathy. Reproductive: Uterus and bilateral ovaries are grossly unremarkable. Other:  No pelvic ascites. Musculoskeletal: Prior right inguinal abscess is no longer visualized. Chronic 2.2 x 5.2 cm fluid collection along the coccyx with suspected destruction/osteomyelitis involving the distal coccyx (series 5/image 46). 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 Prior fluid and gas collection laterally along the right inferior gluteal region has largely resolved (series 5/image 98). Mild decubitus ulcer along the left gluteal region overlying the left posterior acetabulum is improved (series 5/image 80). Additional decubitus ulcer along the left posterior thigh (series 5/image 115), not previously imaged), with associated chronic osteomyelitis/destruction of the left greater trochanter (series 5/image 89). IMPRESSION: Multiple decubitus ulcers with  chronic osteomyelitis involving the coccyx, bilateral posterior acetabulum/right inferior pubic ramus, and left proximal femur/greater trochanter, as above. Ill-defined perirectal fluid collection, suspicious for anorectal abscess, as described above. When compared to the prior, some of this  appearance may be new or newly imaged, while some of this appearance is improved. Electronically Signed   By: Julian Hy M.D.   On: 02/25/2022 20:21    calcitRIOL  0.75 mcg Oral Q M,W,F-HD   calcium acetate  1,334 mg Oral TID with meals   Chlorhexidine Gluconate Cloth  6 each Topical Q0600   darbepoetin (ARANESP) injection - DIALYSIS  300 mcg Intravenous Q Wed-HD   levETIRAcetam  1,000 mg Oral Daily   pantoprazole  40 mg Oral Daily   sevelamer carbonate  800 mg Oral TID WC    BMET    Component Value Date/Time   NA 137 02/27/2022 0431   NA 137 06/14/2014 1403   K 4.7 02/27/2022 0431   CL 99 02/27/2022 0431   CO2 25 02/27/2022 0431   GLUCOSE 99 02/27/2022 0431   BUN 40 (H) 02/27/2022 0431   BUN 10 06/14/2014 1403   CREATININE 5.43 (H) 02/27/2022 0431   CREATININE 0.76 04/18/2015 1424   CALCIUM 9.0 02/27/2022 0431   GFRNONAA 9 (L) 02/27/2022 0431   GFRAA 11 (L) 11/20/2017 1015   CBC    Component Value Date/Time   WBC 10.2 02/27/2022 0431   RBC 2.59 (L) 02/27/2022 0431   HGB 7.8 (L) 02/27/2022 0431   HCT 25.2 (L) 02/27/2022 0431   PLT 405 (H) 02/27/2022 0431   MCV 97.3 02/27/2022 0431   MCH 30.1 02/27/2022 0431   MCHC 31.0 02/27/2022 0431   RDW 17.2 (H) 02/27/2022 0431   RDW 15.0 06/14/2014 1403   LYMPHSABS 1.8 02/25/2022 1816   MONOABS 0.6 02/25/2022 1816   EOSABS 0.3 02/25/2022 1816   BASOSABS 0.0 02/25/2022 1816   Dialyzes at [facility]  MWF 3 hours  EDW 50.5 kg. HD Bath 3/2.5, Dialyzer unknown, Heparin no. Access L AVF  400 BFR, 500 DFR  calcitriol 0.75 TIW and mircera 60 q 2 weeks.  Assessment/Plan: 56 year old hispanic female with ESRD-  admitted for low BP with HD that was transient but now found to have possibly more significant wound findings req antibiotics at least and also profound anemia  bilat hip wounds-  per primary and surgery-  started on abx-  unknown if will need surgical management at this point  ESRD: normally MWF as OP vis AVF-  For  HD today to keep on outpatient schedule.    Hypertension/vol: reportedly had BP drop in HD 02/25/22-  was on multiple antihypertensives-  holding right now-  UF and meds as needed  Acute on chronic Anemia of ESRD: was only on 60 mircera as OP -  Hgb has been dropping since 12/03/21 when was 11.1, then 10.2 on 01/02/22, and 9/2 on 01/07/22 prior to admission.  6 on 02/25/22.  S/p 3 units PRBC's and increased to 7.8.  No obvious source of bleeding.  Will increase ESA and follow H/H. Metabolic Bone Disease: will cont OP calcitriol -  also on renvela   Donetta Potts, MD [facility]

## 2022-02-28 DIAGNOSIS — I959 Hypotension, unspecified: Secondary | ICD-10-CM | POA: Diagnosis not present

## 2022-02-28 LAB — BLOOD CULTURE ID PANEL (REFLEXED) - BCID2

## 2022-02-28 NOTE — Progress Notes (Addendum)
Called by lab at 1550 on 02/28/22 with positive blood culture drawn on 02/25/22.  GPC in one of 2 sets (anaerobic bottle only) Suspect contaminant Will Follow up on BC[facility]  Raychell Holcomb, DO  Addendum: BCID shows MR Staph epidermidis --currently represents contaminant as isolated in one of 2 sets --no further intervention presently needed  Shanon Brow Charly Hunton, DO

## 2022-03-01 ENCOUNTER — Encounter: Payer: Self-pay | Admitting: Internal Medicine

## 2022-03-01 LAB — TYPE AND SCREEN
ABO/RH(D): A POS
Antibody Screen: NEGATIVE
Unit division: 0
Unit division: 0

## 2022-03-01 LAB — BPAM RBC
Blood Product Expiration Date: 202310282359
Blood Product Expiration Date: 202310282359
ISSUE DATE / TIME: 202310022235
Unit Type and Rh: 6200
Unit Type and Rh: 6200

## 2022-03-01 NOTE — Progress Notes (Signed)
Lab called 02/28/22 about positive blood culture results- one bottle - Staph Epidermidis. Likely contaminant.   Bing Neighbors, MD. [facility]  03/01/22 11.16a.m

## 2022-03-02 LAB — CULTURE, BLOOD (ROUTINE X 2)
Culture: NO GROWTH
Special Requests: ADEQUATE
Special Requests: ADEQUATE

## 2022-03-17 ENCOUNTER — Encounter (HOSPITAL_COMMUNITY): Payer: Self-pay

## 2022-03-17 ENCOUNTER — Emergency Department (HOSPITAL_COMMUNITY)
Admission: EM | Admit: 2022-03-17 | Discharge: 2022-03-18 | Disposition: A | Discharge status: Home / Self Care | Payer: Medicare Other | Attending: Emergency Medicine | Admitting: Emergency Medicine

## 2022-03-17 ENCOUNTER — Other Ambulatory Visit: Payer: Self-pay

## 2022-03-17 DIAGNOSIS — T83011A Breakdown (mechanical) of indwelling urethral catheter, initial encounter: Secondary | ICD-10-CM

## 2022-03-17 DIAGNOSIS — Y9389 Activity, other specified: Secondary | ICD-10-CM | POA: Diagnosis not present

## 2022-03-17 NOTE — ED Triage Notes (Signed)
Pt arrived [facility] for catheter leaking.

## 2022-03-17 NOTE — ED Provider Notes (Signed)
[facility] EMERGENCY DEPARTMENT Provider Note   CSN: [id number] Arrival date & time: 03/17/22  4098     History  Chief Complaint  Patient presents with   foley catheter     [name] is a 56 y.o. female.  The history is provided by the patient.     56 year old female with an indwelling suprapubic catheter presenting today with complaints of leakage from her Foley catheter.  Patient noticed urine leaking from the Foley bag since yesterday.  She had Foley exchange a week ago.  She denies any abdominal pain no fever.  She does not have any extra bag for exchange  Home Medications Prior to Admission medications   Medication Sig Start Date End Date Taking? Authorizing Provider  amLODipine (NORVASC) 5 MG tablet Take 5 mg by mouth See admin instructions. Take 1 tablet on Monday,Wednesday and Friday then take 2 tablets on Tuesday,Thursday,Saturday and Sunday. 11/24/18   [provider]  B Complex-C-Folic Acid (RENA-VITE RX PO) Take 1 tablet by mouth daily.    [provider]  calcium acetate (PHOSLO) 667 MG capsule Take 1,334 mg by mouth 3 (three) times daily. 01/30/22   [provider]  carvedilol (COREG) 6.25 MG tablet Take 6.25 mg by mouth 2 (two) times daily with a meal.    [provider]  docusate sodium (COLACE) 100 MG capsule Take 100 mg by mouth at bedtime.    [provider]  furosemide (LASIX) 20 MG tablet Take 20 mg by mouth daily. 08/14/21   [provider]  levETIRAcetam (KEPPRA) 500 MG tablet Take 1,000 mg by mouth daily. 08/09/21   [provider]  Multiple Vitamin (MULTIVITAMIN) tablet Take 1 tablet by mouth daily.    [provider]  pantoprazole (PROTONIX) 40 MG tablet Take 1 tablet by mouth daily. 12/19/21   [provider]  sevelamer carbonate (RENVELA) 800 MG tablet Take 800 mg by mouth 3 (three) times daily with meals.    [provider]      Allergies    Benadryl  [diphenhydramine hcl (sleep)], Quinine derivatives, Vancomycin, Azithromycin, Tetracycline, and Zosyn [piperacillin sod-tazobactam so]    Review of Systems   Review of Systems  All other systems reviewed and are negative.   Physical Exam Updated Vital Signs BP (!) 144/76   Pulse 74   Temp 98 F (36.7 C) (Oral)   Resp 17   Ht '5\' 4"'$  (1.626 m)   Wt 51.5 kg   LMP  (LMP Unknown)   SpO2 97%   BMI 19.49 kg/m  Physical Exam Vitals and nursing note reviewed.  Abdominal:     Palpations: Abdomen is soft.     Comments: Suprapubic Foley catheter is in place.  Leakage coming from the Foley bag.  No signs of infection  Neurological:     Mental Status: She is alert.     ED Results / Procedures / Treatments   Labs (all labs ordered are listed, but only abnormal results are displayed) Labs Reviewed - No data to display  EKG None  Radiology No results found.  Procedures Procedures    Medications Ordered in ED Medications - No data to display  ED Course/ Medical Decision Making/ A&P                           Medical Decision Making  BP (!) 144/76   Pulse 74   Temp 98 F (36.7 C) (Oral)  Resp 17   Ht '5\' 4"'$  (1.626 m)   Wt 51.5 kg   LMP  (LMP Unknown)   SpO2 97%   BMI 19.49 kg/m   59:33 AM  57 year old female with an indwelling suprapubic catheter presenting today with complaints of leakage from her Foley catheter.  Patient noticed urine leaking from the Foley bag since yesterday.  She had Foley exchange a week ago.  She denies any abdominal pain no fever.  She does not have any extra bag for exchange  On exam, this is a patient with paraplegia laying in bed in no acute discomfort.  She has a suprapubic Foley catheter.  There is leakage coming from the Foley bag.  The bag was exchanged by our nurse with resolution of leakage.  Patient is stable to be returned back to her residence.        Final Clinical Impression(s) / ED Diagnoses Final diagnoses:  Malfunction  of Foley catheter, initial encounter [facility])    Rx / DC Orders ED Discharge Orders     None         Domenic Moras, PA-C 03/17/22 1008    Fredia Sorrow, MD 03/17/22 1515

## 2022-03-17 NOTE — ED Notes (Signed)
Pt c/o catheter leaking. Nurse assessed  and noted a hole in cath bag. Cath bag changed.

## 2022-03-17 NOTE — Discharge Instructions (Addendum)
Your Foley bag was leaking urine.  It has been replaced.  Please continue with current management and regular Foley care.

## 2022-06-21 ENCOUNTER — Other Ambulatory Visit: Payer: Self-pay

## 2022-06-21 ENCOUNTER — Encounter (HOSPITAL_COMMUNITY): Payer: Self-pay | Admitting: Emergency Medicine

## 2022-06-21 ENCOUNTER — Emergency Department (HOSPITAL_COMMUNITY)
Admission: EM | Admit: 2022-06-21 | Discharge: 2022-06-22 | Disposition: A | Discharge status: Home / Self Care | Payer: 59 | Attending: Emergency Medicine | Admitting: Emergency Medicine

## 2022-06-21 DIAGNOSIS — T83031A Leakage of indwelling urethral catheter, initial encounter: Secondary | ICD-10-CM | POA: Diagnosis not present

## 2022-06-21 DIAGNOSIS — T83011A Breakdown (mechanical) of indwelling urethral catheter, initial encounter: Secondary | ICD-10-CM

## 2022-06-21 DIAGNOSIS — Z79899 Other long term (current) drug therapy: Secondary | ICD-10-CM | POA: Insufficient documentation

## 2022-06-21 NOTE — ED Triage Notes (Signed)
Pt states that her supra-pubic catheter is leaking. She is not sure if it's from the insertion site or where but states that when she woke up this morning, her depends were wet and there wasn't much urine in catheter bag. Pt states that catheter was just changed on Tuesday.

## 2022-06-22 DIAGNOSIS — T83031A Leakage of indwelling urethral catheter, initial encounter: Secondary | ICD-10-CM | POA: Diagnosis not present

## 2022-06-22 NOTE — ED Notes (Signed)
EMS called for pt transport

## 2022-06-22 NOTE — ED Provider Notes (Signed)
[facility] Provider Note   CSN: [id number] Arrival date & time: 06/21/22  2330     History  Chief Complaint  Patient presents with   Leaking Catheter    [name] is a 57 y.o. female.  The history is provided by the patient.   Patient presents with leaking suprapubic catheter.  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  She had the catheter replaced on January 23 in [place]. They placed a 20 French catheter.  No issues initially.  However tonight she has noted leaking urine and there is no urine in the catheter bag She is otherwise at her baseline, no acute distress  Home Medications Prior to Admission medications   Medication Sig Start Date End Date Taking? Authorizing Provider  amLODipine (NORVASC) 5 MG tablet Take 5 mg by mouth See admin instructions. Take 1 tablet on Monday,Wednesday and Friday then take 2 tablets on Tuesday,Thursday,Saturday and Sunday. 11/24/18   [provider]  B Complex-C-Folic Acid (RENA-VITE RX PO) Take 1 tablet by mouth daily.    [provider]  calcium acetate (PHOSLO) 667 MG capsule Take 1,334 mg by mouth 3 (three) times daily. 01/30/22   [provider]  carvedilol (COREG) 6.25 MG tablet Take 6.25 mg by mouth 2 (two) times daily with a meal.    [provider]  docusate sodium (COLACE) 100 MG capsule Take 100 mg by mouth at bedtime.    [provider]  furosemide (LASIX) 20 MG tablet Take 20 mg by mouth daily. 08/14/21   [provider]  levETIRAcetam (KEPPRA) 500 MG tablet Take 1,000 mg by mouth daily. 08/09/21   [provider]  Multiple Vitamin (MULTIVITAMIN) tablet Take 1 tablet by mouth daily.    [provider]  pantoprazole (PROTONIX) 40 MG tablet Take 1 tablet by mouth daily. 12/19/21   [provider]  sevelamer carbonate (RENVELA) 800 MG tablet Take 800 mg by mouth 3  (three) times daily with meals.    [provider]      Allergies    Benadryl [diphenhydramine hcl (sleep)], Quinine derivatives, Vancomycin, Azithromycin, Tetracycline, and Zosyn [piperacillin sod-tazobactam so]    Review of Systems   Review of Systems  Physical Exam Updated Vital Signs BP (!) 176/82   Pulse 88   Temp 98.4 F (36.9 C) (Oral)   Resp 18   Ht 1.626 m ('5\' 4"'$ )   Wt 61.2 kg   LMP  (LMP Unknown)   SpO2 98%   BMI 23.17 kg/m  Physical Exam CONSTITUTIONAL: Elderly, no acute distress HEAD: Normocephalic/atraumatic EYES: EOM ABDOMEN: soft, nontender, suprapubic catheter is in place without any leakage around the tube. NEURO: Pt is awake/alert/appropriate EXTREMITIES: pulses normal/equal, full ROM  ED Results / Procedures / Treatments   Labs (all labs ordered are listed, but only abnormal results are displayed) Labs Reviewed - No data to display  EKG None  Radiology No results found.  Procedures Ultrasound ED Renal  Date/Time: 06/22/2022 12:08 AM  Performed by: Ripley Fraise, MD Authorized by: Ripley Fraise, MD   Bladder findings:    Bladder:  Visualized   Free pelvic fluid: not identified   Comments:     Bedside ultrasound performed to evaluate the bladder.  No signs of urinary retention.  No obvious abnormal findings noted     Medications Ordered in ED Medications - No data to display  ED Course/ Medical Decision Making/  A&P Clinical Course as of 06/22/22 0039  Sat Jun 22, 2022  0039 Patient in no acute distress.  Catheter appears to be in the bladder, but she is now leaking through her urethra.  No indication for catheter change at this time.  Advised her she needs to call her urologist on Monday for management.  Advise she will need to have frequent diaper changes [DW]    Clinical Course User Index [DW] Ripley Fraise, MD                             Medical Decision Making          Final Clinical Impression(s) /  ED Diagnoses Final diagnoses:  Malfunction of Foley catheter, initial encounter [facility])    Rx / DC Orders ED Discharge Orders     None         Ripley Fraise, MD 06/22/22 0040

## 2022-06-22 NOTE — ED Notes (Signed)
20 Fr cath flushed with 120 ml sterile water, visualized urine return mostly via urethra, small amount of urine back via foley- pt had no pain with this-Dr Christy Gentles made aware

## 2022-06-22 NOTE — ED Notes (Signed)
Pt brief changed.  

## 2022-06-22 NOTE — Discharge Instructions (Signed)
Call your urologist on Monday if you are continuing to leak

## 2022-06-25 ENCOUNTER — Other Ambulatory Visit: Payer: Self-pay

## 2022-06-25 ENCOUNTER — Encounter (HOSPITAL_COMMUNITY): Payer: Self-pay | Admitting: *Deleted

## 2022-06-25 ENCOUNTER — Emergency Department (HOSPITAL_COMMUNITY)
Admission: EM | Admit: 2022-06-25 | Discharge: 2022-06-25 | Disposition: A | Discharge status: Home / Self Care | Payer: 59 | Attending: Emergency Medicine | Admitting: Emergency Medicine

## 2022-06-25 DIAGNOSIS — R339 Retention of urine, unspecified: Secondary | ICD-10-CM | POA: Insufficient documentation

## 2022-06-25 DIAGNOSIS — T83091D Other mechanical complication of indwelling urethral catheter, subsequent encounter: Secondary | ICD-10-CM | POA: Insufficient documentation

## 2022-06-25 DIAGNOSIS — T839XXD Unspecified complication of genitourinary prosthetic device, implant and graft, subsequent encounter: Secondary | ICD-10-CM

## 2022-06-25 MED ORDER — ACETAMINOPHEN 325 MG PO TABS
650.0000 mg | ORAL_TABLET | Freq: Once | ORAL | Status: AC
  Administered 2022-06-25: 650 mg via ORAL
  Filled 2022-06-25: qty 2

## 2022-06-25 NOTE — ED Notes (Signed)
[place] [facility] notified of patient needing transportation back home. 

## 2022-06-25 NOTE — ED Provider Notes (Signed)
[facility] Provider Note   CSN: [id number] Arrival date & time: 06/25/22  1802     History  Chief Complaint  Patient presents with   Urinary Retention    [name] is a 57 y.o. female with a past medical history of paraplegia presenting today with a prepubic catheter concern.  She reports that she had this catheter replaced around a week ago but has been experiencing urinary incontinence.  She says that the bag is not having any drainage and she is constantly urinating on herself.  Says that since Sunday she has not had any output at all and has not been urinating on herself from her urethra either.    HPI     Home Medications Prior to Admission medications   Medication Sig Start Date End Date Taking? Authorizing Provider  amLODipine (NORVASC) 5 MG tablet Take 5 mg by mouth See admin instructions. Take 1 tablet on Monday,Wednesday and Friday then take 2 tablets on Tuesday,Thursday,Saturday and Sunday. 11/24/18   [provider]  B Complex-C-Folic Acid (RENA-VITE RX PO) Take 1 tablet by mouth daily.    [provider]  calcium acetate (PHOSLO) 667 MG capsule Take 1,334 mg by mouth 3 (three) times daily. 01/30/22   [provider]  carvedilol (COREG) 6.25 MG tablet Take 6.25 mg by mouth 2 (two) times daily with a meal.    [provider]  docusate sodium (COLACE) 100 MG capsule Take 100 mg by mouth at bedtime.    [provider]  furosemide (LASIX) 20 MG tablet Take 20 mg by mouth daily. 08/14/21   [provider]  levETIRAcetam (KEPPRA) 500 MG tablet Take 1,000 mg by mouth daily. 08/09/21   [provider]  Multiple Vitamin (MULTIVITAMIN) tablet Take 1 tablet by mouth daily.    [provider]  pantoprazole (PROTONIX) 40 MG tablet Take 1 tablet by mouth daily. 12/19/21   [provider]  sevelamer carbonate (RENVELA) 800 MG tablet Take 800 mg by mouth 3  (three) times daily with meals.    [provider]      Allergies    Benadryl [diphenhydramine hcl (sleep)], Quinine derivatives, Vancomycin, Azithromycin, Tetracycline, and Zosyn [piperacillin sod-tazobactam so]    Review of Systems   Review of Systems  Physical Exam Updated Vital Signs BP (!) 173/76 (BP Location: Right Arm)   Pulse 74   Temp 97.6 F (36.4 C) (Oral)   Resp 20   Ht '5\' 4"'$  (1.626 m)   Wt 61.2 kg   LMP  (LMP Unknown)   SpO2 99%   BMI 23.17 kg/m  Physical Exam Vitals and nursing note reviewed.  Constitutional:      Appearance: Normal appearance.  HENT:     Head: Normocephalic and atraumatic.  Eyes:     General: No scleral icterus.    Conjunctiva/sclera: Conjunctivae normal.  Pulmonary:     Effort: Pulmonary effort is normal. No respiratory distress.  Abdominal:     General: Abdomen is flat.     Palpations: Abdomen is soft.     Comments: No surrounding infection or cellulitis at the suprapubic insertion site.  Skin:    Findings: No rash.  Neurological:     Mental Status: She is alert.  Psychiatric:        Mood and Affect: Mood normal.     ED Results / Procedures / Treatments   Labs (all labs ordered are listed, but only abnormal results are  displayed) Labs Reviewed - No data to display   EKG None  Radiology No results found.  Procedures Procedures   Medications Ordered in ED Medications  acetaminophen (TYLENOL) tablet 650 mg (650 mg Oral Given 06/25/22 1944)    ED Course/ Medical Decision Making/ A&P                             Medical Decision Making Amount and/or Complexity of Data Reviewed Labs: ordered.  Risk OTC drugs.   57 year old female who presents today with a concern for a poorly functioning suprapubic catheter.  She is requesting that it be replaced.  She is having urinary incontinence via her urethra and our nursing staff was able to flush the catheter and says that it is working appropriately.  We did  BladderScan the patient and she had 60 cc in her bladder, no concern for retention.  I reviewed the patient's chart and she had a visit for this 6 days ago.  There was no need to replace the catheter at that time either.  She was instructed to follow-up with her urologist however she did not do so.  Today we discussed even if we were to replace the catheter she would still likely have urinary incontinence via her urethra and that she ultimately does need to see urology about this concern.  She is agreeable to this.  She was offered lab work and imaging to further assess for a kidney infection since her home health nurse mention this however she agreed that this is likely not necessary.  At this time she says that she would like to be discharged home.  This is reasonable.  Stable vital signs, functioning catheter and no retention.  Will follow-up outpatient.  Final Clinical Impression(s) / ED Diagnoses Final diagnoses:  Complication of Foley catheter, subsequent encounter    Rx / DC Orders ED Discharge Orders     None      Results and diagnoses were explained to the patient. Return precautions discussed in full. Patient had no additional questions and expressed complete understanding.   This chart was dictated using voice recognition software.  Despite best efforts to proofread,  errors can occur which can change the documentation meaning.    Darliss Ridgel 06/25/22 2102    Milton Ferguson, MD 06/26/22 1057

## 2022-06-25 NOTE — ED Triage Notes (Signed)
Pt brought in by [facility] with c/o her catheter not draining any urine since Sunday. She reports her catheter was changed from a 16Fr to a 22Fr last week due to her urine leaking on her instead of draining into the bag. Pt also c/o pain to her left side/flank area.

## 2022-06-25 NOTE — ED Notes (Signed)
Pt bladder scan revealed 65 ml in bladder.   Suprapubic catheter flushed and pulled back with no resistance.

## 2022-06-25 NOTE — ED Notes (Signed)
Pt given crackers and water. 

## 2022-06-25 NOTE — Discharge Instructions (Signed)
As we discussed, it is important to follow-up with a urologist.  I have attached 2 names to these papers for you to call for an appointment.  Your Foley is indeed draining however you are leaking fluid from your urethra which is likely why you do not have as much output into your Foley bag.  Return with any worsening pain, fevers, chills or further concerns however ultimately it is very important for you to follow-up with the specialist.  It was a pleasure to meet you and we hope you feel better!

## 2022-07-16 ENCOUNTER — Ambulatory Visit: Payer: 59 | Admitting: Urology

## 2022-07-22 NOTE — Progress Notes (Signed)
I didn't order that

## 2022-07-23 ENCOUNTER — Inpatient Hospital Stay (HOSPITAL_COMMUNITY)
Admission: EM | Admit: 2022-07-23 | Discharge: 2022-07-25 | DRG: 871 | Disposition: A | Discharge status: Other Acute Inpatient Hospital | Payer: 59 | Attending: Family Medicine | Admitting: Family Medicine

## 2022-07-23 ENCOUNTER — Emergency Department (HOSPITAL_COMMUNITY): Payer: 59

## 2022-07-23 ENCOUNTER — Other Ambulatory Visit: Payer: Self-pay

## 2022-07-23 ENCOUNTER — Encounter (HOSPITAL_COMMUNITY): Payer: Self-pay | Admitting: Family Medicine

## 2022-07-23 DIAGNOSIS — G40909 Epilepsy, unspecified, not intractable, without status epilepticus: Secondary | ICD-10-CM

## 2022-07-23 DIAGNOSIS — Z803 Family history of malignant neoplasm of breast: Secondary | ICD-10-CM | POA: Diagnosis not present

## 2022-07-23 DIAGNOSIS — E871 Hypo-osmolality and hyponatremia: Secondary | ICD-10-CM | POA: Diagnosis present

## 2022-07-23 DIAGNOSIS — L089 Local infection of the skin and subcutaneous tissue, unspecified: Secondary | ICD-10-CM | POA: Diagnosis not present

## 2022-07-23 DIAGNOSIS — D649 Anemia, unspecified: Secondary | ICD-10-CM | POA: Diagnosis present

## 2022-07-23 DIAGNOSIS — Z9359 Other cystostomy status: Secondary | ICD-10-CM

## 2022-07-23 DIAGNOSIS — I10 Essential (primary) hypertension: Secondary | ICD-10-CM | POA: Diagnosis not present

## 2022-07-23 DIAGNOSIS — L89159 Pressure ulcer of sacral region, unspecified stage: Secondary | ICD-10-CM | POA: Diagnosis present

## 2022-07-23 DIAGNOSIS — A419 Sepsis, unspecified organism: Secondary | ICD-10-CM | POA: Diagnosis present

## 2022-07-23 DIAGNOSIS — L89314 Pressure ulcer of right buttock, stage 4: Secondary | ICD-10-CM | POA: Diagnosis present

## 2022-07-23 DIAGNOSIS — L8990 Pressure ulcer of unspecified site, unspecified stage: Secondary | ICD-10-CM | POA: Diagnosis not present

## 2022-07-23 DIAGNOSIS — Z888 Allergy status to other drugs, medicaments and biological substances status: Secondary | ICD-10-CM

## 2022-07-23 DIAGNOSIS — Z992 Dependence on renal dialysis: Secondary | ICD-10-CM | POA: Diagnosis present

## 2022-07-23 DIAGNOSIS — D631 Anemia in chronic kidney disease: Secondary | ICD-10-CM | POA: Diagnosis present

## 2022-07-23 DIAGNOSIS — Z8249 Family history of ischemic heart disease and other diseases of the circulatory system: Secondary | ICD-10-CM

## 2022-07-23 DIAGNOSIS — Z1152 Encounter for screening for COVID-19: Secondary | ICD-10-CM

## 2022-07-23 DIAGNOSIS — Z881 Allergy status to other antibiotic agents status: Secondary | ICD-10-CM | POA: Diagnosis not present

## 2022-07-23 DIAGNOSIS — M25451 Effusion, right hip: Secondary | ICD-10-CM | POA: Diagnosis not present

## 2022-07-23 DIAGNOSIS — Z79899 Other long term (current) drug therapy: Secondary | ICD-10-CM | POA: Diagnosis not present

## 2022-07-23 DIAGNOSIS — G8220 Paraplegia, unspecified: Secondary | ICD-10-CM | POA: Diagnosis present

## 2022-07-23 DIAGNOSIS — I4510 Unspecified right bundle-branch block: Secondary | ICD-10-CM | POA: Diagnosis present

## 2022-07-23 DIAGNOSIS — M8629 Subacute osteomyelitis, multiple sites: Secondary | ICD-10-CM

## 2022-07-23 DIAGNOSIS — L03116 Cellulitis of left lower limb: Secondary | ICD-10-CM | POA: Insufficient documentation

## 2022-07-23 DIAGNOSIS — Z993 Dependence on wheelchair: Secondary | ICD-10-CM

## 2022-07-23 DIAGNOSIS — N3001 Acute cystitis with hematuria: Secondary | ICD-10-CM

## 2022-07-23 DIAGNOSIS — Z8542 Personal history of malignant neoplasm of other parts of uterus: Secondary | ICD-10-CM

## 2022-07-23 DIAGNOSIS — I120 Hypertensive chronic kidney disease with stage 5 chronic kidney disease or end stage renal disease: Secondary | ICD-10-CM | POA: Diagnosis present

## 2022-07-23 DIAGNOSIS — Z833 Family history of diabetes mellitus: Secondary | ICD-10-CM | POA: Diagnosis not present

## 2022-07-23 DIAGNOSIS — N186 End stage renal disease: Secondary | ICD-10-CM | POA: Diagnosis present

## 2022-07-23 DIAGNOSIS — M8618 Other acute osteomyelitis, other site: Secondary | ICD-10-CM | POA: Diagnosis present

## 2022-07-23 LAB — CBC WITH DIFFERENTIAL/PLATELET
Abs Immature Granulocytes: 0.34 10*3/uL — ABNORMAL HIGH (ref 0.00–0.07)
Basophils Absolute: 0.1 10*3/uL (ref 0.0–0.1)
Basophils Relative: 0 %
Eosinophils Absolute: 0.1 10*3/uL (ref 0.0–0.5)
Eosinophils Relative: 0 %
HCT: 25.9 % — ABNORMAL LOW (ref 36.0–46.0)
Hemoglobin: 7.8 g/dL — ABNORMAL LOW (ref 12.0–15.0)
Immature Granulocytes: 1 %
Lymphocytes Relative: 8 %
Lymphs Abs: 1.8 10*3/uL (ref 0.7–4.0)
MCH: 29 pg (ref 26.0–34.0)
MCHC: 30.1 g/dL (ref 30.0–36.0)
MCV: 96.3 fL (ref 80.0–100.0)
Monocytes Absolute: 0.8 10*3/uL (ref 0.1–1.0)
Monocytes Relative: 3 %
Neutro Abs: 21 10*3/uL — ABNORMAL HIGH (ref 1.7–7.7)
Neutrophils Relative %: 88 %
Platelets: 266 10*3/uL (ref 150–400)
RBC: 2.69 MIL/uL — ABNORMAL LOW (ref 3.87–5.11)
RDW: 18.9 % — ABNORMAL HIGH (ref 11.5–15.5)
WBC: 24.1 10*3/uL — ABNORMAL HIGH (ref 4.0–10.5)
nRBC: 0 % (ref 0.0–0.2)

## 2022-07-23 LAB — APTT: aPTT: 38 seconds — ABNORMAL HIGH (ref 24–36)

## 2022-07-23 LAB — LACTIC ACID, PLASMA
Lactic Acid, Venous: 1.6 mmol/L (ref 0.5–1.9)
Lactic Acid, Venous: 1.4 mmol/L (ref 0.5–1.9)

## 2022-07-23 LAB — COMPREHENSIVE METABOLIC PANEL
ALT: 10 U/L (ref 0–44)
AST: 14 U/L — ABNORMAL LOW (ref 15–41)
Albumin: 2.5 g/dL — ABNORMAL LOW (ref 3.5–5.0)
Alkaline Phosphatase: 98 U/L (ref 38–126)
Anion gap: 15 (ref 5–15)
BUN: 35 mg/dL — ABNORMAL HIGH (ref 6–20)
CO2: 26 mmol/L (ref 22–32)
Calcium: 8.5 mg/dL — ABNORMAL LOW (ref 8.9–10.3)
Chloride: 88 mmol/L — ABNORMAL LOW (ref 98–111)
Creatinine, Ser: 4.59 mg/dL — ABNORMAL HIGH (ref 0.44–1.00)
GFR, Estimated: 11 mL/min — ABNORMAL LOW (ref >60)
Glucose, Bld: 109 mg/dL — ABNORMAL HIGH (ref 70–99)
Potassium: 3.4 mmol/L — ABNORMAL LOW (ref 3.5–5.1)
Sodium: 129 mmol/L — ABNORMAL LOW (ref 135–145)
Total Bilirubin: 0.7 mg/dL (ref 0.3–1.2)
Total Protein: 7.3 g/dL (ref 6.5–8.1)

## 2022-07-23 LAB — URINALYSIS, W/ REFLEX TO CULTURE (INFECTION SUSPECTED)
Bilirubin Urine: NEGATIVE
Glucose, UA: 150 mg/dL — AB
Ketones, ur: NEGATIVE mg/dL
Nitrite: NEGATIVE
Protein, ur: 100 mg/dL — AB
RBC / HPF: >50 RBC/hpf (ref 0–5)
Specific Gravity, Urine: 1.005 (ref 1.005–1.030)
WBC, UA: >50 WBC/hpf (ref 0–5)
pH: 9 — ABNORMAL HIGH (ref 5.0–8.0)

## 2022-07-23 LAB — RESP PANEL BY RT-PCR (RSV, FLU A&B, COVID)  RVPGX2
Influenza A by PCR: NEGATIVE
Influenza B by PCR: NEGATIVE
Resp Syncytial Virus by PCR: NEGATIVE
SARS Coronavirus 2 by RT PCR: NEGATIVE

## 2022-07-23 LAB — PROTIME-INR
INR: 1.3 — ABNORMAL HIGH (ref 0.8–1.2)
Prothrombin Time: 16.1 seconds — ABNORMAL HIGH (ref 11.4–15.2)

## 2022-07-23 LAB — PREGNANCY, URINE: Preg Test, Ur: NEGATIVE

## 2022-07-23 MED ORDER — ACETAMINOPHEN 325 MG PO TABS: 650.0000 mg | ORAL_TABLET | Freq: Four times a day (QID) | ORAL | Status: DC | PRN

## 2022-07-23 MED ORDER — CYCLOBENZAPRINE HCL 10 MG PO TABS: 5.0000 mg | ORAL_TABLET | Freq: Three times a day (TID) | ORAL | Status: DC | PRN

## 2022-07-23 MED ORDER — VANCOMYCIN HCL IN DEXTROSE 1-5 GM/200ML-% IV SOLN
1000.0000 mg | Freq: Once | INTRAVENOUS | Status: AC
  Administered 2022-07-23: 1000 mg via INTRAVENOUS
  Filled 2022-07-23: qty 200

## 2022-07-23 MED ORDER — CARVEDILOL 3.125 MG PO TABS
6.2500 mg | ORAL_TABLET | Freq: Two times a day (BID) | ORAL | Status: DC
  Administered 2022-07-24 (×2): 6.25 mg via ORAL
  Filled 2022-07-23 (×2): qty 2

## 2022-07-23 MED ORDER — ACETAMINOPHEN 325 MG PO TABS
650.0000 mg | ORAL_TABLET | Freq: Once | ORAL | Status: AC
  Administered 2022-07-23: 650 mg via ORAL
  Filled 2022-07-23: qty 2

## 2022-07-23 MED ORDER — METRONIDAZOLE 500 MG/100ML IV SOLN
500.0000 mg | Freq: Once | INTRAVENOUS | Status: AC
  Administered 2022-07-23: 500 mg via INTRAVENOUS
  Filled 2022-07-23: qty 100

## 2022-07-23 MED ORDER — LACTATED RINGERS IV SOLN: INTRAVENOUS | Status: DC

## 2022-07-23 MED ORDER — SODIUM CHLORIDE 0.9 % IV SOLN: 2.0000 g | INTRAVENOUS | Status: DC

## 2022-07-23 MED ORDER — ACETAMINOPHEN 650 MG RE SUPP: 650.0000 mg | Freq: Four times a day (QID) | RECTAL | Status: DC | PRN

## 2022-07-23 MED ORDER — CLONIDINE HCL 0.1 MG PO TABS
0.1000 mg | ORAL_TABLET | Freq: Two times a day (BID) | ORAL | Status: DC
  Administered 2022-07-23 – 2022-07-24 (×3): 0.1 mg via ORAL
  Filled 2022-07-23 (×3): qty 1

## 2022-07-23 MED ORDER — AMLODIPINE BESYLATE 5 MG PO TABS: 10.0000 mg | ORAL_TABLET | ORAL | Status: DC

## 2022-07-23 MED ORDER — LACTATED RINGERS IV BOLUS (SEPSIS)
1000.0000 mL | Freq: Once | INTRAVENOUS | Status: AC
  Administered 2022-07-23: 1000 mL via INTRAVENOUS

## 2022-07-23 MED ORDER — SEVELAMER CARBONATE 800 MG PO TABS
800.0000 mg | ORAL_TABLET | Freq: Three times a day (TID) | ORAL | Status: DC
  Administered 2022-07-24 (×2): 800 mg via ORAL
  Filled 2022-07-23 (×2): qty 1

## 2022-07-23 MED ORDER — AMLODIPINE BESYLATE 5 MG PO TABS
5.0000 mg | ORAL_TABLET | ORAL | Status: DC
  Administered 2022-07-24: 5 mg via ORAL
  Filled 2022-07-23: qty 1

## 2022-07-23 MED ORDER — FUROSEMIDE 20 MG PO TABS
20.0000 mg | ORAL_TABLET | Freq: Every day | ORAL | Status: DC
  Administered 2022-07-24: 20 mg via ORAL
  Filled 2022-07-23: qty 1

## 2022-07-23 MED ORDER — DOCUSATE SODIUM 100 MG PO CAPS
100.0000 mg | ORAL_CAPSULE | Freq: Every day | ORAL | Status: DC
  Administered 2022-07-24: 100 mg via ORAL
  Filled 2022-07-23: qty 1

## 2022-07-23 MED ORDER — HEPARIN SODIUM (PORCINE) 5000 UNIT/ML IJ SOLN
5000.0000 [IU] | Freq: Three times a day (TID) | INTRAMUSCULAR | Status: DC
  Administered 2022-07-24 (×3): 5000 [IU] via SUBCUTANEOUS
  Filled 2022-07-23 (×3): qty 1

## 2022-07-23 MED ORDER — OXYCODONE HCL 5 MG PO TABS: 5.0000 mg | ORAL_TABLET | ORAL | Status: DC | PRN

## 2022-07-23 MED ORDER — METRONIDAZOLE 500 MG/100ML IV SOLN
500.0000 mg | Freq: Two times a day (BID) | INTRAVENOUS | Status: DC
  Administered 2022-07-24 (×2): 500 mg via INTRAVENOUS
  Filled 2022-07-23 (×2): qty 100

## 2022-07-23 MED ORDER — SODIUM CHLORIDE 0.9 % IV SOLN
2.0000 g | Freq: Once | INTRAVENOUS | Status: AC
  Administered 2022-07-23: 2 g via INTRAVENOUS
  Filled 2022-07-23: qty 12.5

## 2022-07-23 MED ORDER — LEVETIRACETAM 500 MG PO TABS
1000.0000 mg | ORAL_TABLET | Freq: Every day | ORAL | Status: DC
  Administered 2022-07-24: 1000 mg via ORAL
  Filled 2022-07-23: qty 2

## 2022-07-23 MED ORDER — SODIUM CHLORIDE 0.9% FLUSH
3.0000 mL | Freq: Two times a day (BID) | INTRAVENOUS | Status: DC
  Administered 2022-07-24 (×2): 3 mL via INTRAVENOUS

## 2022-07-23 NOTE — ED Notes (Signed)
Per PA pt can have ice chips.

## 2022-07-23 NOTE — ED Triage Notes (Signed)
Pt c/o wounds are red and draining. Pt home health nurse wants pt to be seen and treated before returning home. Pt also goes to wound Dr for wounds on sacrum. Pt has a foley cath in place.

## 2022-07-23 NOTE — H&P (Incomplete)
History and Physical    [name] [id number] DOB: Jun 17, 1965 DOA: 07/23/2022  PCP: Karie Georges, [place]   Patient coming from: Home   Chief Complaint: Increased redness and drainage from pressure wound, chills, malaise  HPI: [name] is a 57 y.o. female with medical history significant for paraplegia, chronic suprapubic catheter, seizure disorder, hypertension, and ESRD on hemodialysis who presents emergency department for evaluation of increased redness and drainage from decubitus ulcers.  Patient reports that she has been feeling generally unwell for approximately 1 week with chills, fatigue, and aches.  She does not have any sensation involving her hips and lower extremities.  Home health nurse was concerned for increased redness and drainage from the patient's decubitus ulcer.  She reports completing hemodialysis without incident yesterday.  ED Course: Upon arrival to the ED, patient is found to be febrile to 38.8 C and saturating well on room air with tachypnea, tachycardia, and stable blood pressure.  CT is concerning for osteomyelitis underlying decubitus ulcer adjacent to the left greater trochanter, right hip effusion, and some chronic findings.  Labs are most notable for sodium 129, albumin 2.5, WBC 24,100, hemoglobin 7.8, and normal lactic acid.  Blood cultures were collected in the ED and the patient was treated with 2 L of LR, acetaminophen, vancomycin, cefepime, and Flagyl.  ED PA discussed the case with orthopedic surgery who did not feel that the patient required joint aspiration tonight.  Review of Systems:  All other systems reviewed and apart from HPI, are negative.  Past Medical History:  Diagnosis Date   Abnormal uterine bleeding (AUB) 06/15/2014   Cancer (HCC)    uterine   High blood pressure    Paraplegia (lower)    Seizure disorder (HCC)    Seizures ([place])    Suprapubic catheter ([place])    Urinary tract infection     Past Surgical  History:  Procedure Laterality Date   APPLICATION OF WOUND VAC Right 09/13/2021   (approximately 1-43mo ago) pressure sore on right hip   BACK SURGERY     Pt stated "before 2000"   ESOPHAGOGASTRODUODENOSCOPY N/A 09/20/2015   Procedure: ESOPHAGOGASTRODUODENOSCOPY (EGD);  Surgeon: NRogene Houston MD;  Location: [facility];  Service: Endoscopy;  Laterality: N/A;  730   IR CATHETER TUBE CHANGE  04/02/2018   PERCUTANEOUS ENDOSCOPIC GASTROSTOMY (PEG) REMOVAL N/A 09/20/2015   Procedure: PERCUTANEOUS ENDOSCOPIC GASTROSTOMY (PEG) REMOVAL;  Surgeon: NRogene Houston MD;  Location: [facility];  Service: Endoscopy;  Laterality: N/A;    Social History:   reports that she has never smoked. She has never used smokeless tobacco. She reports current alcohol use. She reports that she does not use drugs.  Allergies  Allergen Reactions   Benadryl [Diphenhydramine Hcl (Sleep)] Hives   Quinine Derivatives Other (See Comments)    Alters mental status   Vancomycin     Pt is tolerating this medication at HD   Azithromycin Itching and Rash   Tetracycline Itching    Able to tolerate Doxycycline.    Zosyn [Piperacillin Sod-Tazobactam So] Rash    Family History  Problem Relation Age of Onset   Cancer Mother    Hypertension Mother    Cancer Sister        breast and then spread everywhere.   Diabetes Paternal Grandmother    Hypertension Paternal Grandmother      Prior to Admission medications   Medication Sig Start Date End Date Taking? Authorizing Provider  acetaminophen (TYLENOL) 500 MG tablet Take  1,000 mg by mouth every 6 (six) hours as needed for moderate pain.   Yes [provider]  amLODipine (NORVASC) 5 MG tablet Take 5 mg by mouth See admin instructions. Take 1 tablet on Monday,Wednesday and Friday then take 2 tablets on Tuesday,Thursday,Saturday and Sunday. 11/24/18  Yes [provider]  B Complex-C-Folic Acid (RENA-VITE RX PO) Take 1 tablet by mouth daily.   Yes  [provider]  carvedilol (COREG) 6.25 MG tablet Take 6.25 mg by mouth 2 (two) times daily with a meal.   Yes [provider]  cloNIDine (CATAPRES) 0.1 MG tablet Take by mouth. 12/19/21  Yes [provider]  cyclobenzaprine (FLEXERIL) 5 MG tablet Take 5 mg by mouth 3 (three) times daily as needed. 03/18/22  Yes [provider]  docusate sodium (COLACE CLEAR) 50 MG capsule Take by mouth. 10/18/21  Yes [provider]  docusate sodium (COLACE) 100 MG capsule Take 100 mg by mouth at bedtime.   Yes [provider]  furosemide (LASIX) 20 MG tablet Take 20 mg by mouth daily. 08/14/21  Yes [provider]  levETIRAcetam (KEPPRA) 500 MG tablet Take 1,000 mg by mouth daily. 08/09/21  Yes [provider]  lidocaine-prilocaine (EMLA) cream Apply 1 Application topically 3 (three) times a week. 06/04/22  Yes [provider]  Multiple Vitamin (MULTIVITAMIN) tablet Take 1 tablet by mouth daily.   Yes [provider]  sevelamer carbonate (RENVELA) 800 MG tablet Take 800 mg by mouth 3 (three) times daily with meals.   Yes [provider]  calcium acetate (PHOSLO) 667 MG capsule Take 1,334 mg by mouth 3 (three) times daily. Patient not taking: Reported on 07/23/2022 01/30/22   [provider]  lidocaine (XYLOCAINE) 5 % ointment Apply topically. Patient not taking: Reported on 07/23/2022 05/10/22   [provider]    Physical Exam: Vitals:   07/23/22 2136 07/23/22 2145 07/23/22 2227 07/23/22 2300  BP:    (!) 175/73  Pulse:  (!) 116  (!) 117  Resp:  (!) 27    Temp: 99.5 F (37.5 C)  (!) 101.9 F (38.8 C)   TempSrc: Oral  Rectal   SpO2:  96%  94%  Weight:      Height:         Constitutional: NAD, no pallor or cyanosis  Eyes: PERTLA, lids and conjunctivae normal ENMT: Mucous membranes are moist. Posterior pharynx clear of any exudate or lesions.   Neck: supple, no masses  Respiratory: no  wheezing, no crackles. No accessory muscle use.  Cardiovascular: S1 & S2 heard, regular rate and rhythm. No JVD. Abdomen: No distension, no tenderness, soft. Bowel sounds active.  Musculoskeletal: no clubbing / cyanosis. No joint deformity upper and lower extremities.   Skin: Left hip ulcer. Sacral wound. Warm, dry, well-perfused. Neurologic: CN 2-12 grossly intact. Paraplegic. Alert and oriented.  Psychiatric: Calm. Cooperative.    Labs and Imaging on Admission: I have personally reviewed following labs and imaging studies  CBC: Recent Labs  Lab 07/23/22 1734  WBC 24.1*  NEUTROABS 21.0*  HGB 7.8*  HCT 25.9*  MCV 96.3  PLT [id number]   Basic Metabolic Panel: Recent Labs  Lab 07/23/22 1734  NA 129*  K 3.4*  CL 88*  CO2 26  GLUCOSE 109*  BUN 35*  CREATININE 4.59*  CALCIUM 8.5*   GFR: Estimated Creatinine Clearance: 12.1 mL/min (A) (by C-G formula based on SCr of 4.59 mg/dL (H)). Liver Function Tests: Recent Labs  Lab 07/23/22 1734  AST 14*  ALT 10  ALKPHOS 98  BILITOT 0.7  PROT 7.3  ALBUMIN 2.5*   No results for input(s): "LIPASE", "AMYLASE" in the last 168 hours. No results for input(s): "AMMONIA" in the last 168 hours. Coagulation Profile: Recent Labs  Lab 07/23/22 1734  INR 1.3*   Cardiac Enzymes: No results for input(s): "CKTOTAL", "CKMB", "CKMBINDEX", "TROPONINI" in the last 168 hours. BNP (last 3 results) No results for input(s): "PROBNP" in the last 8760 hours. HbA1C: No results for input(s): "HGBA1C" in the last 72 hours. CBG: No results for input(s): "GLUCAP" in the last 168 hours. Lipid Profile: No results for input(s): "CHOL", "HDL", "LDLCALC", "TRIG", "CHOLHDL", "LDLDIRECT" in the last 72 hours. Thyroid Function Tests: No results for input(s): "TSH", "T4TOTAL", "FREET4", "T3FREE", "THYROIDAB" in the last 72 hours. Anemia Panel: No results for input(s): "VITAMINB12", "FOLATE", "FERRITIN", "TIBC", "IRON", "RETICCTPCT" in the last 72 hours. Urine  analysis:    Component Value Date/Time   COLORURINE YELLOW 07/23/2022 2122   APPEARANCEUR TURBID (A) 07/23/2022 2122   LABSPEC 1.005 07/23/2022 2122   PHURINE 9.0 (H) 07/23/2022 2122   GLUCOSEU 150 (A) 07/23/2022 2122   HGBUR SMALL (A) 07/23/2022 2122   BILIRUBINUR NEGATIVE 07/23/2022 2122   KETONESUR NEGATIVE 07/23/2022 2122   PROTEINUR 100 (A) 07/23/2022 2122   UROBILINOGEN 0.2 03/31/2015 1200   NITRITE NEGATIVE 07/23/2022 2122   LEUKOCYTESUR MODERATE (A) 07/23/2022 2122   Sepsis Labs: '@LABRCNTIP'$ (procalcitonin:4,lacticidven:4) ) Recent Results (from the past 240 hour(s))  Resp panel by RT-PCR (RSV, Flu A&B, Covid) Anterior Nasal Swab     Status: None   Collection Time: 07/23/22  5:30 PM   Specimen: Anterior Nasal Swab  Result Value Ref Range Status   SARS Coronavirus 2 by RT PCR NEGATIVE NEGATIVE Final    Comment: (NOTE) SARS-CoV-2 target nucleic acids are NOT DETECTED.  The SARS-CoV-2 RNA is generally detectable in upper respiratory specimens during the acute phase of infection. The lowest concentration of SARS-CoV-2 viral copies this assay can detect is 138 copies/mL. A negative result does not preclude SARS-Cov-2 infection and should not be used as the sole basis for treatment or other patient management decisions. A negative result may occur with  improper specimen collection/handling, submission of specimen other than nasopharyngeal swab, presence of viral mutation(s) within the areas targeted by this assay, and inadequate number of viral copies(<138 copies/mL). A negative result must be combined with clinical observations, patient history, and epidemiological information. The expected result is Negative.  Fact Sheet for Patients:  EntrepreneurPulse.com.au  Fact Sheet for Healthcare Providers:  IncredibleEmployment.be  This test is no t yet approved or cleared by the [place] FDA and  has been authorized for detection  and/or diagnosis of SARS-CoV-2 by FDA under an Emergency Use Authorization (EUA). This EUA will remain  in effect (meaning this test can be used) for the duration of the COVID-19 declaration under Section 564(b)(1) of the Act, 21 U.S.C.section 360bbb-3(b)(1), unless the authorization is terminated  or revoked sooner.       Influenza A by PCR NEGATIVE NEGATIVE Final   Influenza B by PCR NEGATIVE NEGATIVE Final    Comment: (NOTE) The Xpert Xpress SARS-CoV-2/FLU/RSV plus assay is intended as an aid in the diagnosis of influenza from Nasopharyngeal swab specimens and should not be used as a sole basis for treatment. Nasal washings and aspirates are unacceptable for Xpert Xpress SARS-CoV-2/FLU/RSV testing.  Fact Sheet for Patients: EntrepreneurPulse.com.au  Fact Sheet for Healthcare Providers: IncredibleEmployment.be  This test is not yet approved or cleared by the [place] and has been authorized for detection and/or diagnosis of SARS-CoV-2 by FDA under an Emergency Use Authorization (EUA). This EUA will remain in effect (meaning this test can be used) for the duration of the COVID-19 declaration under Section 564(b)(1) of the Act, 21 U.S.C. section 360bbb-3(b)(1), unless the authorization is terminated or revoked.     Resp Syncytial Virus by PCR NEGATIVE NEGATIVE Final    Comment: (NOTE) Fact Sheet for Patients: EntrepreneurPulse.com.au  Fact Sheet for Healthcare Providers: IncredibleEmployment.be  This test is not yet approved or cleared by the [place] FDA and has been authorized for detection and/or diagnosis of SARS-CoV-2 by FDA under an Emergency Use Authorization (EUA). This EUA will remain in effect (meaning this test can be used) for the duration of the COVID-19 declaration under Section 564(b)(1) of the Act, 21 U.S.C. section 360bbb-3(b)(1), unless the authorization is terminated  or revoked.  Performed at [facility], [address], [place], [place] [address]   Blood Culture (routine x 2)     Status: None (Preliminary result)   Collection Time: 07/23/22  5:34 PM   Specimen: Right Antecubital; Blood  Result Value Ref Range Status   Specimen Description RIGHT ANTECUBITAL  Final   Special Requests   Final    BOTTLES DRAWN AEROBIC AND ANAEROBIC Blood Culture adequate volume Performed at [facility], [address] [place], [place] [address]    Culture PENDING  Incomplete   Report Status PENDING  Incomplete  Blood Culture (routine x 2)     Status: None (Preliminary result)   Collection Time: 07/23/22  5:52 PM   Specimen: Blood  Result Value Ref Range Status   Specimen Description BLOOD BLOOD RIGHT ARM  Final   Special Requests   Final    BOTTLES DRAWN AEROBIC AND ANAEROBIC Blood Culture adequate volume Performed at [facility], [address], [place], [place] [address]    Culture PENDING  Incomplete   Report Status PENDING  Incomplete     Radiological Exams on Admission: CT ABDOMEN PELVIS WO CONTRAST  Result Date: 07/23/2022 CLINICAL DATA:  Concern for osteomyelitis of the left iliac wing. Fever. EXAM: CT ABDOMEN AND PELVIS WITHOUT CONTRAST TECHNIQUE: Multidetector CT imaging of the abdomen and pelvis was performed following the standard protocol without IV contrast. RADIATION DOSE REDUCTION: This exam was performed according to the departmental dose-optimization program which includes automated exposure control, adjustment of the mA and/or kV according to patient size and/or use of iterative reconstruction technique. COMPARISON:  CT abdomen and pelvis 08/25/2021. Pelvis CT 02/25/2022. FINDINGS: Lower chest: There is atelectasis in both lung bases, right greater than left. The heart is enlarged. There is a small pericardial effusion. Hepatobiliary: No focal liver abnormality is seen. No gallstones, gallbladder wall thickening, or biliary dilatation. Pancreas:  Unremarkable. No pancreatic ductal dilatation or surrounding inflammatory changes. Spleen: Normal in size without focal abnormality. Adrenals/Urinary Tract: There is bilateral renal atrophy. There is a 12 mm cyst in the superior pole the left kidney. There is no hydronephrosis or urinary tract calculi. The adrenal glands are within normal limits. The bladder is decompressed by suprapubic catheter. Stomach/Bowel: Stomach is within normal limits. Appendix appears normal. No evidence of bowel wall thickening, distention, or inflammatory changes. Vascular/Lymphatic: Aortic atherosclerosis. No enlarged abdominal or pelvic lymph nodes. Reproductive: Uterus and bilateral adnexa are unremarkable. Other: No abdominal wall hernia or abnormality. No abdominopelvic ascites. Musculoskeletal: Previously identified fluid collection adjacent to  the distal sacrum and coccyx has mildly decreased in size contains more internal calcifications. This measures 2.0 x 4.4 by 4.2 cm. There is some erosive changes of the adjacent coccyx, similar to prior study. Decubitus ulcer adjacent to the posterior aspect of the greater trochanter of the left femur is new. There is a new underlying minimal cortical irregularity worrisome for osteomyelitis. Decubitus ulcer overlying the bilateral ischia are unchanged with some underlying cortical erosions, also unchanged. There is a new right hip joint effusion. No acute fractures are seen. IMPRESSION: 1. New decubitus ulcer adjacent to the greater trochanter of the left femur with underlying minimal cortical irregularity worrisome for osteomyelitis. 2. New right hip joint effusion. Correlate clinically for septic arthritis given the clinical history. 3. Decubitus ulcer overlying the bilateral ischia with chronic osteomyelitis appear unchanged. 4. Fluid collection adjacent to the distal sacrum and coccyx has mildly decreased in size. There is stable erosive changes of the coccyx. 5. Bilateral renal  atrophy. 6. Suprapubic catheter in place. 7. Cardiomegaly with small pericardial effusion. Aortic Atherosclerosis (ICD10-I70.0). Electronically Signed   By: Ronney Asters M.D.   On: 07/23/2022 19:25   CT Knee Left Wo Contrast  Result Date: 07/23/2022 CLINICAL DATA:  Possible septic arthritis.  Draining wounds. EXAM: CT OF THE LEFT KNEE WITHOUT CONTRAST TECHNIQUE: Multidetector CT imaging of the left knee was performed according to the standard protocol. Multiplanar CT image reconstructions were also generated. RADIATION DOSE REDUCTION: This exam was performed according to the departmental dose-optimization program which includes automated exposure control, adjustment of the mA and/or kV according to patient size and/or use of iterative reconstruction technique. COMPARISON:  None Available. FINDINGS: Bones/Joint/Cartilage There is mild diffuse decreased bone mineralization. Mild tricompartmental osteoarthritic change of the knee. No focal bone destruction to suggest osteomyelitis. No acute fracture or dislocation. Ligaments Suboptimally assessed by CT. Muscles and Tendons Unremarkable. Soft tissues There is no significant joint effusion. There is moderate subcutaneous edema within the soft tissues adjacent the knee. No definite focal subcutaneous fluid collection to suggest abscess. No air in the soft tissues. IMPRESSION: 1. Moderate subcutaneous edema within the soft tissues adjacent the knee. No definite focal fluid collection to suggest abscess. No air in the soft tissues. No bone destruction to suggest osteomyelitis. 2. Mild tricompartmental osteoarthritic change of the knee. Electronically Signed   By: Marin Olp M.D.   On: 07/23/2022 19:14   DG Chest Port 1 View  Result Date: 07/23/2022 CLINICAL DATA:  Sepsis. EXAM: PORTABLE CHEST 1 VIEW COMPARISON:  April 17, 2018. FINDINGS: Stable cardiomediastinal silhouette. Harrington rods are again noted in thoracic spine. No acute pulmonary disease is  noted. Old right clavicular fracture is noted. IMPRESSION: No active disease. Electronically Signed   By: Marijo Conception M.D.   On: 07/23/2022 18:12   DG Knee Left Port  Result Date: 07/23/2022 CLINICAL DATA:  Left knee swelling. EXAM: PORTABLE LEFT KNEE - 1-2 VIEW COMPARISON:  None Available. FINDINGS: No evidence of fracture, dislocation, or joint effusion. Mild narrowing of the medial and lateral joint spaces are noted. Soft tissues are unremarkable. IMPRESSION: Mild degenerative joint disease.  No acute abnormality seen. Electronically Signed   By: Marijo Conception M.D.   On: 07/23/2022 18:10    EKG: Independently reviewed. Sinus tachycardia, rate 103.   Assessment/Plan   1. Sepsis d/t infected decubitus ulcer with osteomyelitis  - Febrile and tachycardic in ED with leukocytosis; lactate normal and BP stable  - Left hip ulcer appears acutely infected  and CT concerning for underlying osteomyelitis  - Blood cultures were collected in ED and broad-spectrum antibiotics  - Continue antibiotics, wound care, consult ortho, follow cultures and clinical course    2. ESRD  - Pt reports completing HD on 07/22/22  - No indication for urgent dialysis on admission  - Restrict fluids, renally-dose medications, continue phosphorus binder and diuretic    3. Hyponatremia  - Serum sodium 129 on admission  - She was given 2 liters isotonic IVF in ED, will repeat chem panel in am   4. Paraplegia  - Continue supportive care, baclofen    5. Hypertension  - Continue Norvasc, clonidine, and Coreg as tolerated    6. Seizures  - Continue Keppra   7. Anemia  - Hgb appears stable, she denies GI bleeding      DVT prophylaxis: sq heparin  Code Status: Full  Level of Care: Level of care: Stepdown Family Communication: None present   Disposition Plan:  Patient is from: home  Anticipated d/c is to: TBD Anticipated d/c date is: 07/26/22  Patient currently: pending improvement in sepsis parameters   Consults called: none  Admission status: Inpatient     Vianne Bulls, MD [facility]  07/23/2022, 11:10 PM

## 2022-07-23 NOTE — Sepsis Progress Note (Signed)
Sepsis protocol monitored by eLink ?

## 2022-07-23 NOTE — ED Provider Notes (Signed)
[facility] Provider Note   CSN: [id number] Arrival date & time: 07/23/22  1444     History Chief Complaint  Patient presents with   Wound Check    Tracie Comi is a 57 y.o. female with a history significant for traumatic paraplegia, wheelchair-bound, end-stage renal disease on dialysis MWF, history of seizure disorder, hypertension and history of multiple decubitus ulcers, chronic osteomyelitis, presents to the ER for evaluation of "not feeling well" with some chills and nausea for the past two weeks. Denies any measured temperatures. She was sent over here today for increased discharge from her chronic decubitus ulcer on her left gluteal. She has documented Stage IV ulcers that she sees wound care for and home health nursing. She currently has a foley catheter as well. Last dialysis session was yesterday that she stayed the entirety of the session. She is unsure of any abdominal pain as she can not feel her abdomen. She reports her stool output has been normal for her. She thinks she may have a UTI given her foul smelling urine the past few days. Additionally, she mentions that her left knee has been red today.    Wound Check Pertinent negatives include no chest pain and no shortness of breath.       Home Medications Prior to Admission medications   Medication Sig Start Date End Date Taking? Authorizing Provider  acetaminophen (TYLENOL) 500 MG tablet Take 1,000 mg by mouth every 6 (six) hours as needed for moderate pain.   Yes [provider]  amLODipine (NORVASC) 5 MG tablet Take 5 mg by mouth See admin instructions. Take 1 tablet on Monday,Wednesday and Friday then take 2 tablets on Tuesday,Thursday,Saturday and Sunday. 11/24/18  Yes [provider]  B Complex-C-Folic Acid (RENA-VITE RX PO) Take 1 tablet by mouth daily.   Yes [provider]  carvedilol (COREG) 6.25 MG tablet Take 6.25 mg by mouth 2 (two)  times daily with a meal.   Yes [provider]  cloNIDine (CATAPRES) 0.1 MG tablet Take by mouth. 12/19/21  Yes [provider]  cyclobenzaprine (FLEXERIL) 5 MG tablet Take 5 mg by mouth 3 (three) times daily as needed. 03/18/22  Yes [provider]  docusate sodium (COLACE CLEAR) 50 MG capsule Take by mouth. 10/18/21  Yes [provider]  docusate sodium (COLACE) 100 MG capsule Take 100 mg by mouth at bedtime.   Yes [provider]  furosemide (LASIX) 20 MG tablet Take 20 mg by mouth daily. 08/14/21  Yes [provider]  levETIRAcetam (KEPPRA) 500 MG tablet Take 1,000 mg by mouth daily. 08/09/21  Yes [provider]  lidocaine-prilocaine (EMLA) cream Apply 1 Application topically 3 (three) times a week. 06/04/22  Yes [provider]  Multiple Vitamin (MULTIVITAMIN) tablet Take 1 tablet by mouth daily.   Yes [provider]  sevelamer carbonate (RENVELA) 800 MG tablet Take 800 mg by mouth 3 (three) times daily with meals.   Yes [provider]  calcium acetate (PHOSLO) 667 MG capsule Take 1,334 mg by mouth 3 (three) times daily. Patient not taking: Reported on 07/23/2022 01/30/22   [provider]  cyclobenzaprine (FLEXERIL) 10 MG tablet Take by mouth. Patient not taking: Reported on 07/23/2022 05/30/22   [provider]  lidocaine (XYLOCAINE) 5 % ointment Apply topically. Patient not taking: Reported on 07/23/2022 05/10/22   [provider]      Allergies    Benadryl [diphenhydramine hcl (sleep)],  Quinine derivatives, Vancomycin, Azithromycin, Tetracycline, and Zosyn [piperacillin sod-tazobactam so]    Review of Systems   Review of Systems  Constitutional:  Positive for chills.  HENT:  Negative for congestion and rhinorrhea.   Respiratory:  Negative for cough and shortness of breath.   Cardiovascular:  Negative for chest pain.  Gastrointestinal:  Positive for nausea. Negative for  constipation, diarrhea and vomiting.  SEE HPI  Physical Exam Updated Vital Signs BP (!) 158/78   Pulse (!) 111   Temp 98.6 F (37 C) (Oral)   Resp (!) 22   Ht '5\' 4"'$  (1.626 m)   Wt 61.2 kg   LMP  (LMP Unknown)   SpO2 96%   BMI 23.17 kg/m  Physical Exam Vitals and nursing note reviewed.  Constitutional:      General: She is not in acute distress.    Appearance: She is not toxic-appearing.  HENT:     Mouth/Throat:     Mouth: Mucous membranes are dry.  Cardiovascular:     Rate and Rhythm: Tachycardia present.  Pulmonary:     Effort: Pulmonary effort is normal. No respiratory distress.  Abdominal:     Palpations: Abdomen is soft.     Tenderness: There is no guarding or rebound.  Musculoskeletal:        General: Tenderness present.     Cervical back: Normal range of motion.     Right lower leg: No edema.     Left lower leg: No edema.     Comments: Lower - Erythema, edema, and increased warmth noted to the left knee. No fluctuance or induration. No red streaking noted. No obvious deformity. Able to passively flex and extend. No sensation to lower extremities, however this is baseline. Minimal swelling noted to the right lower calf. Compartments are soft. No color change to the RLE.   Gluteus - Two sacral wounds seen, one on the right and one on the left. Please see included image of the one on the left. It does no have any active discharged or purulence appreciated. There is exposed bone. I am unable to see images of what this looked like previously, however I do see that she is being treated for Stage IV wound which is consistent with my findings. One of the right, approximately golf ball in diameter. No active discharge.   Skin:    General: Skin is warm and dry.  Neurological:     Mental Status: She is alert. Mental status is at baseline.     ED Results / Procedures / Treatments   Labs (all labs ordered are listed, but only abnormal results are displayed) Labs Reviewed   COMPREHENSIVE METABOLIC PANEL - Abnormal; Notable for the following components:      Result Value   Sodium 129 (*)    Potassium 3.4 (*)    Chloride 88 (*)    Glucose, Bld 109 (*)    BUN 35 (*)    Creatinine, Ser 4.59 (*)    Calcium 8.5 (*)    Albumin 2.5 (*)    AST 14 (*)    GFR, Estimated 11 (*)    All other components within normal limits  CBC WITH DIFFERENTIAL/PLATELET - Abnormal; Notable for the following components:   WBC 24.1 (*)    RBC 2.69 (*)    Hemoglobin 7.8 (*)    HCT 25.9 (*)    RDW 18.9 (*)    Neutro Abs 21.0 (*)    Abs Immature Granulocytes 0.34 (*)  All other components within normal limits  PROTIME-INR - Abnormal; Notable for the following components:   Prothrombin Time 16.1 (*)    INR 1.3 (*)    All other components within normal limits  APTT - Abnormal; Notable for the following components:   aPTT 38 (*)    All other components within normal limits  RESP PANEL BY RT-PCR (RSV, FLU A&B, COVID)  [id number]  CULTURE, BLOOD (ROUTINE X 2)  CULTURE, BLOOD (ROUTINE X 2)  LACTIC ACID, PLASMA  LACTIC ACID, PLASMA  URINALYSIS, W/ REFLEX TO CULTURE (INFECTION SUSPECTED)  POC URINE PREG, ED    EKG EKG Interpretation  Date/Time:  Tuesday July 23 2022 17:27:16 EST Ventricular Rate:  103 PR Interval:  131 QRS Duration: 115 QT Interval:  368 QTC Calculation: 482 R Axis:   -51 Text Interpretation: Sinus tachycardia Probable left atrial enlargement Incomplete RBBB and LAFB Confirmed by Sherwood Gambler [phone]) on 07/23/2022 7:32:10 PM  Radiology CT ABDOMEN PELVIS WO CONTRAST  Result Date: 07/23/2022 CLINICAL DATA:  Concern for osteomyelitis of the left iliac wing. Fever. EXAM: CT ABDOMEN AND PELVIS WITHOUT CONTRAST TECHNIQUE: Multidetector CT imaging of the abdomen and pelvis was performed following the standard protocol without IV contrast. RADIATION DOSE REDUCTION: This exam was performed according to the departmental dose-optimization program which includes  automated exposure control, adjustment of the mA and/or kV according to patient size and/or use of iterative reconstruction technique. COMPARISON:  CT abdomen and pelvis 08/25/2021. Pelvis CT 02/25/2022. FINDINGS: Lower chest: There is atelectasis in both lung bases, right greater than left. The heart is enlarged. There is a small pericardial effusion. Hepatobiliary: No focal liver abnormality is seen. No gallstones, gallbladder wall thickening, or biliary dilatation. Pancreas: Unremarkable. No pancreatic ductal dilatation or surrounding inflammatory changes. Spleen: Normal in size without focal abnormality. Adrenals/Urinary Tract: There is bilateral renal atrophy. There is a 12 mm cyst in the superior pole the left kidney. There is no hydronephrosis or urinary tract calculi. The adrenal glands are within normal limits. The bladder is decompressed by suprapubic catheter. Stomach/Bowel: Stomach is within normal limits. Appendix appears normal. No evidence of bowel wall thickening, distention, or inflammatory changes. Vascular/Lymphatic: Aortic atherosclerosis. No enlarged abdominal or pelvic lymph nodes. Reproductive: Uterus and bilateral adnexa are unremarkable. Other: No abdominal wall hernia or abnormality. No abdominopelvic ascites. Musculoskeletal: Previously identified fluid collection adjacent to the distal sacrum and coccyx has mildly decreased in size contains more internal calcifications. This measures 2.0 x 4.4 by 4.2 cm. There is some erosive changes of the adjacent coccyx, similar to prior study. Decubitus ulcer adjacent to the posterior aspect of the greater trochanter of the left femur is new. There is a new underlying minimal cortical irregularity worrisome for osteomyelitis. Decubitus ulcer overlying the bilateral ischia are unchanged with some underlying cortical erosions, also unchanged. There is a new right hip joint effusion. No acute fractures are seen. IMPRESSION: 1. New decubitus ulcer  adjacent to the greater trochanter of the left femur with underlying minimal cortical irregularity worrisome for osteomyelitis. 2. New right hip joint effusion. Correlate clinically for septic arthritis given the clinical history. 3. Decubitus ulcer overlying the bilateral ischia with chronic osteomyelitis appear unchanged. 4. Fluid collection adjacent to the distal sacrum and coccyx has mildly decreased in size. There is stable erosive changes of the coccyx. 5. Bilateral renal atrophy. 6. Suprapubic catheter in place. 7. Cardiomegaly with small pericardial effusion. Aortic Atherosclerosis (ICD10-I70.0). Electronically Signed   By: Ronney Asters M.D.   On:  07/23/2022 19:25   CT Knee Left Wo Contrast  Result Date: 07/23/2022 CLINICAL DATA:  Possible septic arthritis.  Draining wounds. EXAM: CT OF THE LEFT KNEE WITHOUT CONTRAST TECHNIQUE: Multidetector CT imaging of the left knee was performed according to the standard protocol. Multiplanar CT image reconstructions were also generated. RADIATION DOSE REDUCTION: This exam was performed according to the departmental dose-optimization program which includes automated exposure control, adjustment of the mA and/or kV according to patient size and/or use of iterative reconstruction technique. COMPARISON:  None Available. FINDINGS: Bones/Joint/Cartilage There is mild diffuse decreased bone mineralization. Mild tricompartmental osteoarthritic change of the knee. No focal bone destruction to suggest osteomyelitis. No acute fracture or dislocation. Ligaments Suboptimally assessed by CT. Muscles and Tendons Unremarkable. Soft tissues There is no significant joint effusion. There is moderate subcutaneous edema within the soft tissues adjacent the knee. No definite focal subcutaneous fluid collection to suggest abscess. No air in the soft tissues. IMPRESSION: 1. Moderate subcutaneous edema within the soft tissues adjacent the knee. No definite focal fluid collection to suggest  abscess. No air in the soft tissues. No bone destruction to suggest osteomyelitis. 2. Mild tricompartmental osteoarthritic change of the knee. Electronically Signed   By: Marin Olp M.D.   On: 07/23/2022 19:14   DG Chest Port 1 View  Result Date: 07/23/2022 CLINICAL DATA:  Sepsis. EXAM: PORTABLE CHEST 1 VIEW COMPARISON:  April 17, 2018. FINDINGS: Stable cardiomediastinal silhouette. Harrington rods are again noted in thoracic spine. No acute pulmonary disease is noted. Old right clavicular fracture is noted. IMPRESSION: No active disease. Electronically Signed   By: Marijo Conception M.D.   On: 07/23/2022 18:12   DG Knee Left Port  Result Date: 07/23/2022 CLINICAL DATA:  Left knee swelling. EXAM: PORTABLE LEFT KNEE - 1-2 VIEW COMPARISON:  None Available. FINDINGS: No evidence of fracture, dislocation, or joint effusion. Mild narrowing of the medial and lateral joint spaces are noted. Soft tissues are unremarkable. IMPRESSION: Mild degenerative joint disease.  No acute abnormality seen. Electronically Signed   By: Marijo Conception M.D.   On: 07/23/2022 18:10    Procedures Procedures   Medications Ordered in ED Medications  lactated ringers infusion (has no administration in time range)  vancomycin (VANCOCIN) IVPB 1000 mg/200 mL premix (1,000 mg Intravenous New Bag/Given 07/23/22 2006)  ceFEPIme (MAXIPIME) 2 g in sodium chloride 0.9 % 100 mL IVPB (has no administration in time range)  lactated ringers bolus 1,000 mL (0 mLs Intravenous Stopped 07/23/22 1922)    And  lactated ringers bolus 1,000 mL (0 mLs Intravenous Stopped 07/23/22 2003)  metroNIDAZOLE (FLAGYL) IVPB 500 mg (0 mg Intravenous Stopped 07/23/22 1921)  ceFEPIme (MAXIPIME) 2 g in sodium chloride 0.9 % 100 mL IVPB ( Intravenous Stopped 07/23/22 1947)    ED Course/ Medical Decision Making/ A&P    Medical Decision Making Amount and/or Complexity of Data Reviewed Labs: ordered. Radiology: ordered. ECG/medicine tests:  ordered.  Risk OTC drugs. Prescription drug management. Decision regarding hospitalization.   57 year old female presents emerged from today for evaluation of not feeling well as well as worsening chronic wounds.  Differential diagnosis includes is limited to worsening wound, abscess, cellulitis, osteomyelitis, sepsis, UTI, intra-abdominal etiology, cellulitis, septic arthritis, viral illness.  Vital signs show blood pressure 158/78, rectal temp was taken and was 100.2, tachycardic, satting well on room air without increased work of breathing.  Physical exam as noted above.  Please see images.  Concern for sepsis given the patient's elevated temperature and  tachycardia.  Sepsis order set placed.  Broad-spectrum antibiotics ordered with pharmacy consultation.  Fluids initiated.  I independently reviewed and interpreted the patient's labs.  Negative for COVID, flu, RSV.  Mildly elevated APTT and PT/INR.  CBC shows significant leukocytosis with a white blood cell count of 24.1 and a left shift.  Platelets normal.  Significant anemia although is around patient's baseline of 7.8.  CMP shows new hyponatremia with a sodium of 129.  Glucose is 109.  Creatinine is at around baseline at 4.59, currently dialysis patient.  BUN at 35.  Decreasing her calcium, albumin, and AST.  Lactic acid within normal limits.  Urine shows turbid urine that has glucose and hemoglobin present.  Mildly increased pH 9.0.  Small amount of protein seen.  Moderate mild leukocytes present with many bacteria as well as greater than 50 red blood cells and greater than 50 white blood cells.  White blood cell clumps present as well.  Consistent with UTI.  Blood cultures pending.  Urine culture pending.  My attending assessed at bedside recommends x-ray of the knee first and then if no joint effusion is seen, can order CT of the knee to rule out any joint effusion for possible septic arthritis.  Chest x-ray shows no acute cardiopulmonary  process.  Left knee x-ray shows mild degenerative joint disease.  No acute abnormality seen. No joint effusion.  CT of the left knee shows  1. Moderate subcutaneous edema within the soft tissues adjacent the knee. No definite focal fluid collection to suggest abscess. No air in the soft tissues. No bone destruction to suggest osteomyelitis. 2. Mild tricompartmental osteoarthritic change of the knee.   CT abdomen pelvis shows 1. New decubitus ulcer adjacent to the greater trochanter of the left femur with underlying minimal cortical irregularity worrisome for osteomyelitis. 2. New right hip joint effusion. Correlate clinically for septic arthritis given the clinical history. 3. Decubitus ulcer overlying the bilateral ischia with chronic osteomyelitis appear unchanged. 4. Fluid collection adjacent to the distal sacrum and coccyx has mildly decreased in size. There is stable erosive changes of the coccyx. 5. Bilateral renal atrophy. 6. Suprapubic catheter in place. 7. Cardiomegaly with small pericardial effusion. Aortic Atherosclerosis.  I CONSULTED orthopedics and spoke with Dr. Stann Mainland who is on call. I discussed with him the patient's history, presentation, and imaging readings.  I discussed the concern for septic arthritis.  He reports that given the patient's paraplegic state, treating septic arthritis with antibiotics at this time is reasonable.  He does not think there needs to be an emergent joint tap or washout.  He does recommend that IR possibly to do a tap of the hip if concern type of pathogen or source of infection.  Again, he does not think this needs to be done emergently tonight. He agreed with the antibiotics being given.  Patient is aware of admission.    Discussed with my attending who agrees. He does not think a knee tap is needed given that there is no fluid or effusion seen on CT imaging. More likely cellulitis. Will discuss with admitting hospitalist.  Admitted to Dr. Myna Hidalgo.  I  discussed this case with my attending physician who cosigned this note including patient's presenting symptoms, physical exam, and planned diagnostics and interventions. Attending physician stated agreement with plan or made changes to plan which were implemented.   Attending physician assessed patient at bedside.  Final Clinical Impression(s) / ED Diagnoses Final diagnoses:  Sepsis, due to unspecified organism, unspecified whether  acute organ dysfunction present [facility])  Acute cystitis with hematuria  Hyponatremia  Subacute osteomyelitis of multiple sites [facility])  Cellulitis of left lower extremity    Rx / DC Orders ED Discharge Orders     None         Sherrell Puller, PA-C 07/24/22 [id number]    Sherwood Gambler, MD 07/26/22 1133

## 2022-07-23 NOTE — ED Notes (Signed)
ED Provider at bedside. 

## 2022-07-23 NOTE — Progress Notes (Signed)
A consult was received from an ED physician for cefepime and vancomycin per pharmacy dosing.  The patient's profile has been reviewed for ht/wt/allergies/indication/available labs.    A one time order has been placed for cefepime 2 g IV and vancomycin 1000 mg IV.    Further antibiotics/pharmacy consults should be ordered by admitting physician if indicated.                       Thank you, Suzzanne Cloud, PharmD, BCPS 07/23/2022  5:56 PM

## 2022-07-23 NOTE — H&P (Incomplete)
History and Physical    [name] [id number] DOB: 12-06-1965 DOA: 07/23/2022  PCP: Karie Georges, [place]   Patient coming from: Home   Chief Complaint: Increased redness and drainage from pressure wound, chills, malaise  HPI: [name] is a 57 y.o. female with medical history significant for paraplegia, chronic suprapubic catheter, seizure disorder, hypertension, and ESRD on hemodialysis who presents emergency department for evaluation of increased redness and drainage from decubitus ulcers.  Patient reports that she has been feeling generally unwell for approximately 1 week with chills, fatigue, and aches.  She does not have any sensation involving her hips and lower extremities.  Home health nurse was concerned for increased redness and drainage from the patient's decubitus ulcer.  She reports completing hemodialysis without incident yesterday.  ED Course: Upon arrival to the ED, patient is found to be febrile to 38.8 C and saturating well on room air with tachypnea, tachycardia, and stable blood pressure.  CT is concerning for osteomyelitis underlying decubitus ulcer adjacent to the left greater trochanter, right hip effusion, and some chronic findings.  Labs are most notable for sodium 129, albumin 2.5, WBC 24,100, hemoglobin 7.8, and normal lactic acid.  Blood cultures were collected in the ED and the patient was treated with 2 L of LR, acetaminophen, vancomycin, cefepime, and Flagyl.  ED PA discussed the case with orthopedic surgery who did not feel that the patient required joint aspiration tonight.  Review of Systems:  All other systems reviewed and apart from HPI, are negative.  Past Medical History:  Diagnosis Date  . Abnormal uterine bleeding (AUB) 06/15/2014  . Cancer ([place])    uterine  . High blood pressure   . Paraplegia (lower)   . Seizure disorder ([place])   . Seizures ([place])   . Suprapubic catheter ([place])   . Urinary tract infection     Past  Surgical History:  Procedure Laterality Date  . APPLICATION OF WOUND VAC Right 09/13/2021   (approximately 1-25mo ago) pressure sore on right hip  . BACK SURGERY     Pt stated "before 2000"  . ESOPHAGOGASTRODUODENOSCOPY N/A 09/20/2015   Procedure: ESOPHAGOGASTRODUODENOSCOPY (EGD);  Surgeon: NRogene Houston MD;  [facility]: [facility];  Service: Endoscopy;  Laterality: N/A;  730  . IR CATHETER TUBE CHANGE  04/02/2018  . PERCUTANEOUS ENDOSCOPIC GASTROSTOMY (PEG) REMOVAL N/A 09/20/2015   Procedure: PERCUTANEOUS ENDOSCOPIC GASTROSTOMY (PEG) REMOVAL;  Surgeon: NRogene Houston MD;  Location: [facility];  Service: Endoscopy;  Laterality: N/A;    Social History:   reports that she has never smoked. She has never used smokeless tobacco. She reports current alcohol use. She reports that she does not use drugs.  Allergies  Allergen Reactions  . Benadryl [Diphenhydramine Hcl (Sleep)] Hives  . Quinine Derivatives Other (See Comments)    Alters mental status  . Vancomycin     Pt is tolerating this medication at HD  . Azithromycin Itching and Rash  . Tetracycline Itching    Able to tolerate Doxycycline.   .Lajean Silvius[Piperacillin Sod-Tazobactam So] Rash    Family History  Problem Relation Age of Onset  . Cancer Mother   . Hypertension Mother   . Cancer Sister        breast and then spread everywhere.  . Diabetes Paternal Grandmother   . Hypertension Paternal Grandmother      Prior to Admission medications   Medication Sig Start Date End Date Taking? Authorizing Provider  acetaminophen (TYLENOL) 500 MG tablet Take  1,000 mg by mouth every 6 (six) hours as needed for moderate pain.   Yes [provider]  amLODipine (NORVASC) 5 MG tablet Take 5 mg by mouth See admin instructions. Take 1 tablet on Monday,Wednesday and Friday then take 2 tablets on Tuesday,Thursday,Saturday and Sunday. 11/24/18  Yes [provider]  B Complex-C-Folic Acid (RENA-VITE RX PO) Take 1 tablet by  mouth daily.   Yes [provider]  carvedilol (COREG) 6.25 MG tablet Take 6.25 mg by mouth 2 (two) times daily with a meal.   Yes [provider]  cloNIDine (CATAPRES) 0.1 MG tablet Take by mouth. 12/19/21  Yes [provider]  cyclobenzaprine (FLEXERIL) 5 MG tablet Take 5 mg by mouth 3 (three) times daily as needed. 03/18/22  Yes [provider]  docusate sodium (COLACE CLEAR) 50 MG capsule Take by mouth. 10/18/21  Yes [provider]  docusate sodium (COLACE) 100 MG capsule Take 100 mg by mouth at bedtime.   Yes [provider]  furosemide (LASIX) 20 MG tablet Take 20 mg by mouth daily. 08/14/21  Yes [provider]  levETIRAcetam (KEPPRA) 500 MG tablet Take 1,000 mg by mouth daily. 08/09/21  Yes [provider]  lidocaine-prilocaine (EMLA) cream Apply 1 Application topically 3 (three) times a week. 06/04/22  Yes [provider]  Multiple Vitamin (MULTIVITAMIN) tablet Take 1 tablet by mouth daily.   Yes [provider]  sevelamer carbonate (RENVELA) 800 MG tablet Take 800 mg by mouth 3 (three) times daily with meals.   Yes [provider]  calcium acetate (PHOSLO) 667 MG capsule Take 1,334 mg by mouth 3 (three) times daily. Patient not taking: Reported on 07/23/2022 01/30/22   [provider]  lidocaine (XYLOCAINE) 5 % ointment Apply topically. Patient not taking: Reported on 07/23/2022 05/10/22   [provider]    Physical Exam: Vitals:   07/23/22 2136 07/23/22 2145 07/23/22 2227 07/23/22 2300  BP:    (!) 175/73  Pulse:  (!) 116  (!) 117  Resp:  (!) 27    Temp: 99.5 F (37.5 C)  (!) 101.9 F (38.8 C)   TempSrc: Oral  Rectal   SpO2:  96%  94%  Weight:      Height:         Constitutional: NAD, calm  Eyes: PERTLA, lids and conjunctivae normal ENMT: Mucous membranes are moist. Posterior pharynx clear of any exudate or lesions.   Neck: supple, no masses  Respiratory: clear  to auscultation bilaterally, no wheezing, no crackles. No accessory muscle use.  Cardiovascular: S1 & S2 heard, regular rate and rhythm. No extremity edema. No significant JVD. Abdomen: No distension, no tenderness, soft. Bowel sounds active.  Musculoskeletal: no clubbing / cyanosis. No joint deformity upper and lower extremities.   Skin: no significant rashes, lesions, ulcers. Warm, dry, well-perfused. Neurologic: CN 2-12 grossly intact. Sensation intact, DTR normal. Strength 5/5 in all 4 limbs. Alert and oriented.  Psychiatric: Pleasant. Cooperative.    Labs and Imaging on Admission: I have personally reviewed following labs and imaging studies  CBC: Recent Labs  Lab 07/23/22 1734  WBC 24.1*  NEUTROABS 21.0*  HGB 7.8*  HCT 25.9*  MCV 96.3  PLT [id number]   Basic Metabolic Panel: Recent Labs  Lab 07/23/22 1734  NA 129*  K 3.4*  CL 88*  CO2 26  GLUCOSE 109*  BUN 35*  CREATININE 4.59*  CALCIUM 8.5*   GFR: Estimated Creatinine Clearance: 12.1 mL/min (A) (by C-G  formula based on SCr of 4.59 mg/dL (H)). Liver Function Tests: Recent Labs  Lab 07/23/22 1734  AST 14*  ALT 10  ALKPHOS 98  BILITOT 0.7  PROT 7.3  ALBUMIN 2.5*   No results for input(s): "LIPASE", "AMYLASE" in the last 168 hours. No results for input(s): "AMMONIA" in the last 168 hours. Coagulation Profile: Recent Labs  Lab 07/23/22 1734  INR 1.3*   Cardiac Enzymes: No results for input(s): "CKTOTAL", "CKMB", "CKMBINDEX", "TROPONINI" in the last 168 hours. BNP (last 3 results) No results for input(s): "PROBNP" in the last 8760 hours. HbA1C: No results for input(s): "HGBA1C" in the last 72 hours. CBG: No results for input(s): "GLUCAP" in the last 168 hours. Lipid Profile: No results for input(s): "CHOL", "HDL", "LDLCALC", "TRIG", "CHOLHDL", "LDLDIRECT" in the last 72 hours. Thyroid Function Tests: No results for input(s): "TSH", "T4TOTAL", "FREET4", "T3FREE", "THYROIDAB" in the last 72 hours. Anemia  Panel: No results for input(s): "VITAMINB12", "FOLATE", "FERRITIN", "TIBC", "IRON", "RETICCTPCT" in the last 72 hours. Urine analysis:    Component Value Date/Time   COLORURINE YELLOW 07/23/2022 2122   APPEARANCEUR TURBID (A) 07/23/2022 2122   LABSPEC 1.005 07/23/2022 2122   PHURINE 9.0 (H) 07/23/2022 2122   GLUCOSEU 150 (A) 07/23/2022 2122   HGBUR SMALL (A) 07/23/2022 2122   BILIRUBINUR NEGATIVE 07/23/2022 2122   KETONESUR NEGATIVE 07/23/2022 2122   PROTEINUR 100 (A) 07/23/2022 2122   UROBILINOGEN 0.2 03/31/2015 1200   NITRITE NEGATIVE 07/23/2022 2122   LEUKOCYTESUR MODERATE (A) 07/23/2022 2122   Sepsis Labs: '@LABRCNTIP'$ (procalcitonin:4,lacticidven:4) ) Recent Results (from the past 240 hour(s))  Resp panel by RT-PCR (RSV, Flu A&B, Covid) Anterior Nasal Swab     Status: None   Collection Time: 07/23/22  5:30 PM   Specimen: Anterior Nasal Swab  Result Value Ref Range Status   SARS Coronavirus 2 by RT PCR NEGATIVE NEGATIVE Final    Comment: (NOTE) SARS-CoV-2 target nucleic acids are NOT DETECTED.  The SARS-CoV-2 RNA is generally detectable in upper respiratory specimens during the acute phase of infection. The lowest concentration of SARS-CoV-2 viral copies this assay can detect is 138 copies/mL. A negative result does not preclude SARS-Cov-2 infection and should not be used as the sole basis for treatment or other patient management decisions. A negative result may occur with  improper specimen collection/handling, submission of specimen other than nasopharyngeal swab, presence of viral mutation(s) within the areas targeted by this assay, and inadequate number of viral copies(<138 copies/mL). A negative result must be combined with clinical observations, patient history, and epidemiological information. The expected result is Negative.  Fact Sheet for Patients:  EntrepreneurPulse.com.au  Fact Sheet for Healthcare Providers:   IncredibleEmployment.be  This test is no t yet approved or cleared by the [place] FDA and  has been authorized for detection and/or diagnosis of SARS-CoV-2 by FDA under an Emergency Use Authorization (EUA). This EUA will remain  in effect (meaning this test can be used) for the duration of the COVID-19 declaration under Section 564(b)(1) of the Act, 21 U.S.C.section 360bbb-3(b)(1), unless the authorization is terminated  or revoked sooner.       Influenza A by PCR NEGATIVE NEGATIVE Final   Influenza B by PCR NEGATIVE NEGATIVE Final    Comment: (NOTE) The Xpert Xpress SARS-CoV-2/FLU/RSV plus assay is intended as an aid in the diagnosis of influenza from Nasopharyngeal swab specimens and should not be used as a sole basis for treatment. Nasal washings and aspirates are unacceptable for Xpert Xpress SARS-CoV-2/FLU/RSV testing.  Fact Sheet for Patients: EntrepreneurPulse.com.au  Fact Sheet for Healthcare Providers: IncredibleEmployment.be  This test is not yet approved or cleared by the [place] FDA and has been authorized for detection and/or diagnosis of SARS-CoV-2 by FDA under an Emergency Use Authorization (EUA). This EUA will remain in effect (meaning this test can be used) for the duration of the COVID-19 declaration under Section 564(b)(1) of the Act, 21 U.S.C. section 360bbb-3(b)(1), unless the authorization is terminated or revoked.     Resp Syncytial Virus by PCR NEGATIVE NEGATIVE Final    Comment: (NOTE) Fact Sheet for Patients: EntrepreneurPulse.com.au  Fact Sheet for Healthcare Providers: IncredibleEmployment.be  This test is not yet approved or cleared by the [place] FDA and has been authorized for detection and/or diagnosis of SARS-CoV-2 by FDA under an Emergency Use Authorization (EUA). This EUA will remain in effect (meaning this test can be used) for  the duration of the COVID-19 declaration under Section 564(b)(1) of the Act, 21 U.S.C. section 360bbb-3(b)(1), unless the authorization is terminated or revoked.  Performed at [facility], [address] [place], [place] [address]   Blood Culture (routine x 2)     Status: None (Preliminary result)   Collection Time: 07/23/22  5:34 PM   Specimen: Right Antecubital; Blood  Result Value Ref Range Status   Specimen Description RIGHT ANTECUBITAL  Final   Special Requests   Final    BOTTLES DRAWN AEROBIC AND ANAEROBIC Blood Culture adequate volume Performed at [facility], [address], [place], [place] [address]    Culture PENDING  Incomplete   Report Status PENDING  Incomplete  Blood Culture (routine x 2)     Status: None (Preliminary result)   Collection Time: 07/23/22  5:52 PM   Specimen: Blood  Result Value Ref Range Status   Specimen Description BLOOD BLOOD RIGHT ARM  Final   Special Requests   Final    BOTTLES DRAWN AEROBIC AND ANAEROBIC Blood Culture adequate volume Performed at [facility], [address] [place], [place] [address]    Culture PENDING  Incomplete   Report Status PENDING  Incomplete     Radiological Exams on Admission: CT ABDOMEN PELVIS WO CONTRAST  Result Date: 07/23/2022 CLINICAL DATA:  Concern for osteomyelitis of the left iliac wing. Fever. EXAM: CT ABDOMEN AND PELVIS WITHOUT CONTRAST TECHNIQUE: Multidetector CT imaging of the abdomen and pelvis was performed following the standard protocol without IV contrast. RADIATION DOSE REDUCTION: This exam was performed according to the departmental dose-optimization program which includes automated exposure control, adjustment of the mA and/or kV according to patient size and/or use of iterative reconstruction technique. COMPARISON:  CT abdomen and pelvis 08/25/2021. Pelvis CT 02/25/2022. FINDINGS: Lower chest: There is atelectasis in both lung bases, right greater than left. The heart is enlarged. There is a small  pericardial effusion. Hepatobiliary: No focal liver abnormality is seen. No gallstones, gallbladder wall thickening, or biliary dilatation. Pancreas: Unremarkable. No pancreatic ductal dilatation or surrounding inflammatory changes. Spleen: Normal in size without focal abnormality. Adrenals/Urinary Tract: There is bilateral renal atrophy. There is a 12 mm cyst in the superior pole the left kidney. There is no hydronephrosis or urinary tract calculi. The adrenal glands are within normal limits. The bladder is decompressed by suprapubic catheter. Stomach/Bowel: Stomach is within normal limits. Appendix appears normal. No evidence of bowel wall thickening, distention, or inflammatory changes. Vascular/Lymphatic: Aortic atherosclerosis. No enlarged abdominal or pelvic lymph nodes. Reproductive: Uterus and bilateral adnexa are unremarkable. Other: No abdominal wall  hernia or abnormality. No abdominopelvic ascites. Musculoskeletal: Previously identified fluid collection adjacent to the distal sacrum and coccyx has mildly decreased in size contains more internal calcifications. This measures 2.0 x 4.4 by 4.2 cm. There is some erosive changes of the adjacent coccyx, similar to prior study. Decubitus ulcer adjacent to the posterior aspect of the greater trochanter of the left femur is new. There is a new underlying minimal cortical irregularity worrisome for osteomyelitis. Decubitus ulcer overlying the bilateral ischia are unchanged with some underlying cortical erosions, also unchanged. There is a new right hip joint effusion. No acute fractures are seen. IMPRESSION: 1. New decubitus ulcer adjacent to the greater trochanter of the left femur with underlying minimal cortical irregularity worrisome for osteomyelitis. 2. New right hip joint effusion. Correlate clinically for septic arthritis given the clinical history. 3. Decubitus ulcer overlying the bilateral ischia with chronic osteomyelitis appear unchanged. 4. Fluid  collection adjacent to the distal sacrum and coccyx has mildly decreased in size. There is stable erosive changes of the coccyx. 5. Bilateral renal atrophy. 6. Suprapubic catheter in place. 7. Cardiomegaly with small pericardial effusion. Aortic Atherosclerosis (ICD10-I70.0). Electronically Signed   By: Ronney Asters M.D.   On: 07/23/2022 19:25   CT Knee Left Wo Contrast  Result Date: 07/23/2022 CLINICAL DATA:  Possible septic arthritis.  Draining wounds. EXAM: CT OF THE LEFT KNEE WITHOUT CONTRAST TECHNIQUE: Multidetector CT imaging of the left knee was performed according to the standard protocol. Multiplanar CT image reconstructions were also generated. RADIATION DOSE REDUCTION: This exam was performed according to the departmental dose-optimization program which includes automated exposure control, adjustment of the mA and/or kV according to patient size and/or use of iterative reconstruction technique. COMPARISON:  None Available. FINDINGS: Bones/Joint/Cartilage There is mild diffuse decreased bone mineralization. Mild tricompartmental osteoarthritic change of the knee. No focal bone destruction to suggest osteomyelitis. No acute fracture or dislocation. Ligaments Suboptimally assessed by CT. Muscles and Tendons Unremarkable. Soft tissues There is no significant joint effusion. There is moderate subcutaneous edema within the soft tissues adjacent the knee. No definite focal subcutaneous fluid collection to suggest abscess. No air in the soft tissues. IMPRESSION: 1. Moderate subcutaneous edema within the soft tissues adjacent the knee. No definite focal fluid collection to suggest abscess. No air in the soft tissues. No bone destruction to suggest osteomyelitis. 2. Mild tricompartmental osteoarthritic change of the knee. Electronically Signed   By: Marin Olp M.D.   On: 07/23/2022 19:14   DG Chest Port 1 View  Result Date: 07/23/2022 CLINICAL DATA:  Sepsis. EXAM: PORTABLE CHEST 1 VIEW COMPARISON:   April 17, 2018. FINDINGS: Stable cardiomediastinal silhouette. Harrington rods are again noted in thoracic spine. No acute pulmonary disease is noted. Old right clavicular fracture is noted. IMPRESSION: No active disease. Electronically Signed   By: Marijo Conception M.D.   On: 07/23/2022 18:12   DG Knee Left Port  Result Date: 07/23/2022 CLINICAL DATA:  Left knee swelling. EXAM: PORTABLE LEFT KNEE - 1-2 VIEW COMPARISON:  None Available. FINDINGS: No evidence of fracture, dislocation, or joint effusion. Mild narrowing of the medial and lateral joint spaces are noted. Soft tissues are unremarkable. IMPRESSION: Mild degenerative joint disease.  No acute abnormality seen. Electronically Signed   By: Marijo Conception M.D.   On: 07/23/2022 18:10    EKG: Independently reviewed. Sinus tachycardia, rate 103.   Assessment/Plan   1. Sepsis d/t infected decubitus ulcer with osteomyelitis  - Febrile and tachycardic in ED with leukocytosis;  lactate normal and BP stable  - Left hip ulcer appears acute infected and CT concerning for underlying osteomyelitis  - Blood cultures were collected in ED and broad-spectrum antibiotics  - Continue antibiotics, wound care, follow cultures and clinical course    2. ESRD  - Pt reports completing HD on 07/22/22  - No indication for urgent dialysis on admission  - Restrict fluids, renally-dose medications, continue phosphorus binder and diuretic    3. Hyponatremia  - Serum sodium 129 on admission  - She was given 2 liters isotonic IVF in ED, will repeat chem panel in am   4. Paraplegia  - Continue supportive care, baclofen    5. Hypertension  - Continue Norvasc, clonidine, and Coreg as tolerated    6. Seizures  - Continue Keppra   7. Anemia  - Hgb appears stable, she denies GI bleeding      DVT prophylaxis: sq heparin  Code Status: Full  Level of Care: Level of care: Stepdown Family Communication: None present   Disposition Plan:  Patient is from: home   Anticipated d/c is to: TBD Anticipated d/c date is: 07/26/22  Patient currently: pending improvement in sepsis parameters  Consults called: none  Admission status: Inpatient     Vianne Bulls, MD [facility]  07/23/2022, 11:10 PM

## 2022-07-24 DIAGNOSIS — L8990 Pressure ulcer of unspecified site, unspecified stage: Secondary | ICD-10-CM | POA: Diagnosis not present

## 2022-07-24 DIAGNOSIS — L03116 Cellulitis of left lower limb: Secondary | ICD-10-CM | POA: Diagnosis not present

## 2022-07-24 DIAGNOSIS — M25451 Effusion, right hip: Secondary | ICD-10-CM

## 2022-07-24 DIAGNOSIS — A419 Sepsis, unspecified organism: Secondary | ICD-10-CM | POA: Diagnosis not present

## 2022-07-24 DIAGNOSIS — N186 End stage renal disease: Secondary | ICD-10-CM | POA: Diagnosis not present

## 2022-07-24 LAB — BASIC METABOLIC PANEL
Anion gap: 7 (ref 5–15)
BUN: 40 mg/dL — ABNORMAL HIGH (ref 6–20)
CO2: 26 mmol/L (ref 22–32)
Calcium: 8 mg/dL — ABNORMAL LOW (ref 8.9–10.3)
Chloride: 96 mmol/L — ABNORMAL LOW (ref 98–111)
Creatinine, Ser: 4.59 mg/dL — ABNORMAL HIGH (ref 0.44–1.00)
GFR, Estimated: 11 mL/min — ABNORMAL LOW (ref >60)
Glucose, Bld: 89 mg/dL (ref 70–99)
Potassium: 4.1 mmol/L (ref 3.5–5.1)
Sodium: 129 mmol/L — ABNORMAL LOW (ref 135–145)

## 2022-07-24 LAB — PHOSPHORUS: Phosphorus: 2.7 mg/dL (ref 2.5–4.6)

## 2022-07-24 LAB — CBG MONITORING, ED: Glucose-Capillary: 105 mg/dL — ABNORMAL HIGH (ref 70–99)

## 2022-07-24 LAB — CBC
HCT: 23.5 % — ABNORMAL LOW (ref 36.0–46.0)
Hemoglobin: 7.1 g/dL — ABNORMAL LOW (ref 12.0–15.0)
MCH: 29.3 pg (ref 26.0–34.0)
MCHC: 30.2 g/dL (ref 30.0–36.0)
MCV: 97.1 fL (ref 80.0–100.0)
Platelets: 252 10*3/uL (ref 150–400)
RBC: 2.42 MIL/uL — ABNORMAL LOW (ref 3.87–5.11)
RDW: 18.6 % — ABNORMAL HIGH (ref 11.5–15.5)
WBC: 20.1 10*3/uL — ABNORMAL HIGH (ref 4.0–10.5)
nRBC: 0 % (ref 0.0–0.2)

## 2022-07-24 LAB — HEPATITIS B SURFACE ANTIGEN: Hepatitis B Surface Ag: NONREACTIVE

## 2022-07-24 MED ORDER — VANCOMYCIN HCL 750 MG/150ML IV SOLN
750.0000 mg | INTRAVENOUS | Status: DC
  Administered 2022-07-24: 750 mg via INTRAVENOUS
  Filled 2022-07-24 (×2): qty 150

## 2022-07-24 MED ORDER — CHLORHEXIDINE GLUCONATE CLOTH 2 % EX PADS: 6.0000 | MEDICATED_PAD | Freq: Every day | CUTANEOUS | Status: DC

## 2022-07-24 MED ORDER — SODIUM CHLORIDE 0.9 % IV SOLN
2.0000 g | INTRAVENOUS | Status: DC
  Administered 2022-07-24: 2 g via INTRAVENOUS
  Filled 2022-07-24: qty 12.5

## 2022-07-24 MED ORDER — CALCITRIOL 0.25 MCG PO CAPS
0.5000 ug | ORAL_CAPSULE | ORAL | Status: DC
  Administered 2022-07-24: 0.5 ug via ORAL
  Filled 2022-07-24: qty 2

## 2022-07-24 MED ORDER — DARBEPOETIN ALFA 200 MCG/0.4ML IJ SOSY
200.0000 ug | PREFILLED_SYRINGE | INTRAMUSCULAR | Status: DC
  Filled 2022-07-24: qty 0.4

## 2022-07-24 NOTE — Progress Notes (Signed)
Pharmacy Antibiotic Note  [name] is a 57 y.o. female admitted on 07/23/2022 with  unknown source of infection .  Pharmacy has been consulted for vancomycin and cefepime dosing.  Plan: Vancomycin 750 mg IV every MWF w/ HD Cefepime 2000 mg IV every MWF w/ HD Monitor labs, c/s, and vanco levels as indicated. Follow-up HD plans.  Height: '5\' 4"'$  (162.6 cm) Weight: 61.2 kg (135 lb) IBW/kg (Calculated) : 54.7  Temp (24hrs), Avg:99.3 F (37.4 C), Min:98.2 F (36.8 C), Max:101.9 F (38.8 C)  Recent Labs  Lab 07/23/22 1734 07/23/22 1752 07/23/22 1959 07/24/22 0429  WBC 24.1*  --   --  20.1*  CREATININE 4.59*  --   --  4.59*  LATICACIDVEN  --  1.6 1.4  --     Estimated Creatinine Clearance: 12.1 mL/min (A) (by C-G formula based on SCr of 4.59 mg/dL (H)).    Allergies  Allergen Reactions   Benadryl [Diphenhydramine Hcl (Sleep)] Hives   Quinine Derivatives Other (See Comments)    Alters mental status   Vancomycin     Pt is tolerating this medication at HD   Azithromycin Itching and Rash   Tetracycline Itching    Able to tolerate Doxycycline.    Zosyn [Piperacillin Sod-Tazobactam So] Rash    Antimicrobials this admission: Vanco 2/27  >>  Cefepime 2/27 >> Flagyl 2/27 >>   Microbiology results: 2/27 BCx: pending 2/27 UCx: pending    Thank you for allowing pharmacy to be a part of this patient's care.  Margot Ables, PharmD Clinical Pharmacist 07/24/2022 11:30 AM

## 2022-07-24 NOTE — TOC Initial Note (Signed)
Transition of Care [facility]) - Initial/Assessment Note    Patient Details  Name: [name] MRN: [id number] Date of Birth: Sep 06, 1965  Transition of Care [facility]) CM/SW Contact:    Ihor Gully, LCSW Phone Number: 07/24/2022, 1:42 PM  Clinical Narrative:                 Patient from home with daughter. Considered high risk for readmission.Lives with daughter, on HD MWF at [facility]. Uses hoyer lift, electric wc. Has manual wc also. Active with HHRN through [facility].   Expected Discharge Plan: [place] Barriers to Discharge: Continued Medical Work up   Patient Goals and CMS Choice Patient states their goals for this hospitalization and ongoing recovery are:: return home          Expected Discharge Plan and Services       Living arrangements for the past 2 months: Single Family Home                                      Prior Living Arrangements/Services Living arrangements for the past 2 months: Single Family Home Lives with:: Adult Children              Current home services: DME, Home RN (wc, eclectric wc, hoyer lift)    Activities of Daily Living      Permission Sought/Granted                  Emotional Assessment     Affect (typically observed): Appropriate Orientation: : Oriented to Self, Oriented to Place, Oriented to  Time, Oriented to Situation Alcohol / Substance Use: Not Applicable Psych Involvement: No (comment)  Admission diagnosis:  Infected decubitus ulcer [L89.90, L08.9] Sepsis ([place]) [A41.9] Patient Active Problem List   Diagnosis Date Noted   Cellulitis of knee, left 07/24/2022   Infected decubitus ulcer 07/23/2022   Osteomyelitis of multiple sites-chronic     Chronic anemia 02/25/2022   ESRD (end stage renal disease) ([place]) 02/25/2022   Abscess of buttock, right 08/25/2021   GERD (gastroesophageal reflux disease) 08/25/2021   Anxiety 08/25/2021   Decubitus ulcers 08/25/2021   Anemia  08/24/2021   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 chronic kidney disease ([place]) 04/25/2017   AKI (acute kidney injury) ([place]) 04/25/2017   Dehydration 04/25/2017   CKD (chronic kidney disease) stage 4, GFR 15-29 ml/min ([place]) 123456   Metabolic acidosis 123456   Altered mental status 06/22/2016   Altered mental state 08/16/2015   Pressure ulcer 05/16/2015   Acute encephalopathy 05/15/2015   Malnutrition ([place])    Encephalopathy acute 10/10/2014   Sepsis ([place]) 10/10/2014   HCAP (healthcare-associated pneumonia) 10/10/2014   Encephalomalacia 10/10/2014   TBI (traumatic brain injury) ([place]) 10/10/2014   Severe protein-calorie malnutrition ([place]) 10/10/2014   UTI (urinary tract infection) 08/26/2014   Hypokalemia 08/26/2014   Abnormal uterine bleeding (AUB) 06/15/2014   Essential hypertension    Seizure disorder ([place])    Status epilepticus ([place]) 05/02/2014   Hypertensive emergency without congestive heart failure 05/02/2014   Acute renal failure ([place]) 05/21/2013   Hyperkalemia 05/21/2013   Hypothermia 05/21/2013   Paraplegia ([place]) 06/25/2012   Suprapubic catheter ([place]) 06/25/2012   Decubitus ulcer of buttock 06/25/2012   PCP:  Karie Georges, FNP Pharmacy:   [facility], [place] [address] Phone: [phone] Fax: [phone]  Social Determinants of Health (SDOH) Social History: SDOH Screenings   Food Insecurity: No Food Insecurity (02/26/2022)  Housing: Low Risk  (02/26/2022)  Transportation Needs: No Transportation Needs (02/26/2022)  Utilities: Not At Risk (02/26/2022)  Tobacco Use: Low Risk  (07/23/2022)   SDOH Interventions:     Readmission Risk Interventions     No data to display

## 2022-07-24 NOTE — Consult Note (Addendum)
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    Patient should remain on low air loss mattress for moisture management and pressure redistribution.    WOC has asked for pictures of L ischial wound, most recent note from wound center states this had healed.  WOC will continue to follow for further wound care orders.   Thank you,    Tanayia Wahlquist MSN, RN-BC, [employer]

## 2022-07-24 NOTE — Consult Note (Signed)
Reason for Consult: To manage dialysis and dialysis related needs Referring Physician: Flormaria Moilanen is an 57 y.o. female with paraplegia and chronic sounds, chronic suprapubic catheter, ESRD-  previous hospitalizations related to these wounds-  presents again with same. She had her last HD on Monday at [facility] without incident-  she has a little bit of edema present.  We are asked to provide her routine HD   Dialyzes at [facility]-   MWF- 3 hours  -   EDW 50-  but likely needs to be lowered- left out at 49 on Monday. HD [place] 3/2.5, Dialyzer unknown, Heparin no. Access left AVF- 15 gauge- one inch-400 BFR. 500 DFR  calcitriol 0.5 tiw- mircera   Past Medical History:  Diagnosis Date   Abnormal uterine bleeding (AUB) 06/15/2014   Cancer (HCC)    uterine   High blood pressure    Paraplegia (lower)    Seizure disorder (HCC)    Seizures ([place])    Suprapubic catheter ([place])    Urinary tract infection     Past Surgical History:  Procedure Laterality Date   APPLICATION OF WOUND VAC Right 09/13/2021   (approximately 1-47mo ago) pressure sore on right hip   BACK SURGERY     Pt stated "before 2000"   ESOPHAGOGASTRODUODENOSCOPY N/A 09/20/2015   Procedure: ESOPHAGOGASTRODUODENOSCOPY (EGD);  Surgeon: NRogene Houston MD;  [facility];  Service: Endoscopy;  Laterality: N/A;  730   IR CATHETER TUBE CHANGE  04/02/2018   PERCUTANEOUS ENDOSCOPIC GASTROSTOMY (PEG) REMOVAL N/A 09/20/2015   Procedure: PERCUTANEOUS ENDOSCOPIC GASTROSTOMY (PEG) REMOVAL;  Surgeon: NRogene Houston MD;  [facility];  Service: Endoscopy;  Laterality: N/A;    Family History  Problem Relation Age of Onset   Cancer Mother    Hypertension Mother    Cancer Sister        breast and then spread everywhere.   Diabetes Paternal Grandmother    Hypertension Paternal Grandmother     Social History:  reports that she has never smoked. She has never used smokeless tobacco. She  reports current alcohol use. She reports that she does not use drugs.  Allergies:  Allergies  Allergen Reactions   Benadryl [Diphenhydramine Hcl (Sleep)] Hives   Quinine Derivatives Other (See Comments)    Alters mental status   Vancomycin     Pt is tolerating this medication at HD   Azithromycin Itching and Rash   Tetracycline Itching    Able to tolerate Doxycycline.    Zosyn [Piperacillin Sod-Tazobactam So] Rash    Medications: I have reviewed the patient's current medications.  Results for orders placed or performed during the hospital encounter of 07/23/22 (from the past 48 hour(s))  Resp panel by RT-PCR (RSV, Flu A&B, Covid) Anterior Nasal Swab     Status: None   Collection Time: 07/23/22  5:30 PM   Specimen: Anterior Nasal Swab  Result Value Ref Range   SARS Coronavirus 2 by RT PCR NEGATIVE NEGATIVE    Comment: (NOTE) SARS-CoV-2 target nucleic acids are NOT DETECTED.  The SARS-CoV-2 RNA is generally detectable in upper respiratory specimens during the acute phase of infection. The lowest concentration of SARS-CoV-2 viral copies this assay can detect is 138 copies/mL. A negative result does not preclude SARS-Cov-2 infection and should not be used as the sole basis for treatment or other patient management decisions. A negative result may occur with  improper specimen collection/handling, submission of specimen other than nasopharyngeal swab, presence  of viral mutation(s) within the areas targeted by this assay, and inadequate number of viral copies(<138 copies/mL). A negative result must be combined with clinical observations, patient history, and epidemiological information. The expected result is Negative.  Fact Sheet for Patients:  EntrepreneurPulse.com.au  Fact Sheet for Healthcare Providers:  IncredibleEmployment.be  This test is no t yet approved or cleared by the [place] FDA and  has been authorized for detection  and/or diagnosis of SARS-CoV-2 by FDA under an Emergency Use Authorization (EUA). This EUA will remain  in effect (meaning this test can be used) for the duration of the COVID-19 declaration under Section 564(b)(1) of the Act, 21 U.S.C.section 360bbb-3(b)(1), unless the authorization is terminated  or revoked sooner.       Influenza A by PCR NEGATIVE NEGATIVE   Influenza B by PCR NEGATIVE NEGATIVE    Comment: (NOTE) The Xpert Xpress SARS-CoV-2/FLU/RSV plus assay is intended as an aid in the diagnosis of influenza from Nasopharyngeal swab specimens and should not be used as a sole basis for treatment. Nasal washings and aspirates are unacceptable for Xpert Xpress SARS-CoV-2/FLU/RSV testing.  Fact Sheet for Patients: EntrepreneurPulse.com.au  Fact Sheet for Healthcare Providers: IncredibleEmployment.be  This test is not yet approved or cleared by the [place] FDA and has been authorized for detection and/or diagnosis of SARS-CoV-2 by FDA under an Emergency Use Authorization (EUA). This EUA will remain in effect (meaning this test can be used) for the duration of the COVID-19 declaration under Section 564(b)(1) of the Act, 21 U.S.C. section 360bbb-3(b)(1), unless the authorization is terminated or revoked.     Resp Syncytial Virus by PCR NEGATIVE NEGATIVE    Comment: (NOTE) Fact Sheet for Patients: EntrepreneurPulse.com.au  Fact Sheet for Healthcare Providers: IncredibleEmployment.be  This test is not yet approved or cleared by the [place] FDA and has been authorized for detection and/or diagnosis of SARS-CoV-2 by FDA under an Emergency Use Authorization (EUA). This EUA will remain in effect (meaning this test can be used) for the duration of the COVID-19 declaration under Section 564(b)(1) of the Act, 21 U.S.C. section 360bbb-3(b)(1), unless the authorization is terminated  or revoked.  Performed at [facility], [address] [place], [place] [address]   Comprehensive metabolic panel     Status: Abnormal   Collection Time: 07/23/22  5:34 PM  Result Value Ref Range   Sodium 129 (L) 135 - 145 mmol/L   Potassium 3.4 (L) 3.5 - 5.1 mmol/L   Chloride 88 (L) 98 - 111 mmol/L   CO2 26 22 - 32 mmol/L   Glucose, Bld 109 (H) 70 - 99 mg/dL    Comment: Glucose reference range applies only to samples taken after fasting for at least 8 hours.   BUN 35 (H) 6 - 20 mg/dL   Creatinine, Ser 4.59 (H) 0.44 - 1.00 mg/dL   Calcium 8.5 (L) 8.9 - 10.3 mg/dL   Total Protein 7.3 6.5 - 8.1 g/dL   Albumin 2.5 (L) 3.5 - 5.0 g/dL   AST 14 (L) 15 - 41 U/L   ALT 10 0 - 44 U/L   Alkaline Phosphatase 98 38 - 126 U/L   Total Bilirubin 0.7 0.3 - 1.2 mg/dL   GFR, Estimated 11 (L) >60 mL/min    Comment: (NOTE) Calculated using the CKD-EPI Creatinine Equation (2021)    Anion gap 15 5 - 15    Comment: Performed at [facility], [address], [place], [place] [address]  CBC with Differential  Status: Abnormal   Collection Time: 07/23/22  5:34 PM  Result Value Ref Range   WBC 24.1 (H) 4.0 - 10.5 K/uL   RBC 2.69 (L) 3.87 - 5.11 MIL/uL   Hemoglobin 7.8 (L) 12.0 - 15.0 g/dL   HCT 25.9 (L) 36.0 - 46.0 %   MCV 96.3 80.0 - 100.0 fL   MCH 29.0 26.0 - 34.0 pg   MCHC 30.1 30.0 - 36.0 g/dL   RDW 18.9 (H) 11.5 - 15.5 %   Platelets 266 150 - 400 K/uL   nRBC 0.0 0.0 - 0.2 %   Neutrophils Relative % 88 %   Neutro Abs 21.0 (H) 1.7 - 7.7 K/uL   Lymphocytes Relative 8 %   Lymphs Abs 1.8 0.7 - 4.0 K/uL   Monocytes Relative 3 %   Monocytes Absolute 0.8 0.1 - 1.0 K/uL   Eosinophils Relative 0 %   Eosinophils Absolute 0.1 0.0 - 0.5 K/uL   Basophils Relative 0 %   Basophils Absolute 0.1 0.0 - 0.1 K/uL   Immature Granulocytes 1 %   Abs Immature Granulocytes 0.34 (H) 0.00 - 0.07 K/uL    Comment: Performed at [facility], [address], [place], [place] [address]  Protime-INR     Status:  Abnormal   Collection Time: 07/23/22  5:34 PM  Result Value Ref Range   Prothrombin Time 16.1 (H) 11.4 - 15.2 seconds   INR 1.3 (H) 0.8 - 1.2    Comment: (NOTE) INR goal varies based on device and disease states. Performed at [facility], [address] [place], [place] [address]   APTT     Status: Abnormal   Collection Time: 07/23/22  5:34 PM  Result Value Ref Range   aPTT 38 (H) 24 - 36 seconds    Comment:        IF BASELINE aPTT IS ELEVATED, SUGGEST PATIENT RISK ASSESSMENT BE USED TO DETERMINE APPROPRIATE ANTICOAGULANT THERAPY. Performed at [facility], [address], [place], [place] [address]   Blood Culture (routine x 2)     Status: None (Preliminary result)   Collection Time: 07/23/22  5:34 PM   Specimen: Right Antecubital; Blood  Result Value Ref Range   Specimen Description RIGHT ANTECUBITAL    Special Requests      BOTTLES DRAWN AEROBIC AND ANAEROBIC Blood Culture adequate volume   Culture      NO GROWTH < 24 HOURS Performed at [facility], [address], [place], [place] [address]    Report Status PENDING   Lactic acid, plasma     Status: None   Collection Time: 07/23/22  5:52 PM  Result Value Ref Range   Lactic Acid, Venous 1.6 0.5 - 1.9 mmol/L    Comment: Performed at [facility], [address] [place], [place] [address]  Blood Culture (routine x 2)     Status: None (Preliminary result)   Collection Time: 07/23/22  5:52 PM   Specimen: BLOOD  Result Value Ref Range   Specimen Description BLOOD BLOOD RIGHT ARM    Special Requests      BOTTLES DRAWN AEROBIC AND ANAEROBIC Blood Culture adequate volume   Culture      NO GROWTH < 24 HOURS Performed at [facility], [address], [place], [place] [address]    Report Status PENDING   Lactic acid, plasma     Status: None   Collection Time: 07/23/22  7:59 PM  Result Value Ref Range   Lactic Acid, Venous 1.4 0.5 -  1.9 mmol/L    Comment: Performed at [facility], [address], [place], [place] [address]   Urinalysis, w/ Reflex to Culture (Infection Suspected) -Urine, Catheterized     Status: Abnormal   Collection Time: 07/23/22  9:22 PM  Result Value Ref Range   Specimen Source URINE, CATHETERIZED    Color, Urine YELLOW YELLOW   APPearance TURBID (A) CLEAR   Specific Gravity, Urine 1.005 1.005 - 1.030   pH 9.0 (H) 5.0 - 8.0   Glucose, UA 150 (A) NEGATIVE mg/dL   Hgb urine dipstick SMALL (A) NEGATIVE   Bilirubin Urine NEGATIVE NEGATIVE   Ketones, ur NEGATIVE NEGATIVE mg/dL   Protein, ur 100 (A) NEGATIVE mg/dL   Nitrite NEGATIVE NEGATIVE   Leukocytes,Ua MODERATE (A) NEGATIVE   RBC / HPF >50 0 - 5 RBC/hpf   WBC, UA >50 0 - 5 WBC/hpf    Comment:        Reflex urine culture not performed if WBC <=10, OR if Squamous epithelial cells >5. If Squamous epithelial cells >5 suggest recollection.    Bacteria, UA MANY (A) NONE SEEN   Squamous Epithelial / HPF 6-10 0 - 5 /HPF   WBC Clumps PRESENT    Mucus PRESENT    Amorphous Crystal PRESENT     Comment: Performed at [facility], [address] [place], [place] [address]  Pregnancy, urine     Status: None   Collection Time: 07/23/22  9:22 PM  Result Value Ref Range   Preg Test, Ur NEGATIVE NEGATIVE    Comment:        THE SENSITIVITY OF THIS METHODOLOGY IS >20 mIU/mL. Performed at [facility], [address], [place], [place] [address]   POC CBG, ED     Status: Abnormal   Collection Time: 07/24/22  2:39 AM  Result Value Ref Range   Glucose-Capillary 105 (H) 70 - 99 mg/dL    Comment: Glucose reference range applies only to samples taken after fasting for at least 8 hours.  Basic metabolic panel     Status: Abnormal   Collection Time: 07/24/22  4:29 AM  Result Value Ref Range   Sodium 129 (L) 135 - 145 mmol/L   Potassium 4.1 3.5 - 5.1 mmol/L   Chloride 96 (L) 98 - 111 mmol/L   CO2 26 22 - 32 mmol/L   Glucose, Bld 89 70 - 99 mg/dL    Comment: Glucose reference range applies only to samples taken after fasting for at least 8 hours.    BUN 40 (H) 6 - 20 mg/dL   Creatinine, Ser 4.59 (H) 0.44 - 1.00 mg/dL   Calcium 8.0 (L) 8.9 - 10.3 mg/dL   GFR, Estimated 11 (L) >60 mL/min    Comment: (NOTE) Calculated using the CKD-EPI Creatinine Equation (2021)    Anion gap 7 5 - 15    Comment: Performed at [facility], [address] [place], [place] [address]  Phosphorus     Status: None   Collection Time: 07/24/22  4:29 AM  Result Value Ref Range   Phosphorus 2.7 2.5 - 4.6 mg/dL    Comment: Performed at [facility], [address], [place], [place] [address]  CBC     Status: Abnormal   Collection Time: 07/24/22  4:29 AM  Result Value Ref Range   WBC 20.1 (H) 4.0 - 10.5 K/uL   RBC 2.42 (L) 3.87 - 5.11 MIL/uL   Hemoglobin 7.1 (L) 12.0 - 15.0 g/dL   HCT 23.5 (L)  36.0 - 46.0 %   MCV 97.1 80.0 - 100.0 fL   MCH 29.3 26.0 - 34.0 pg   MCHC 30.2 30.0 - 36.0 g/dL   RDW 18.6 (H) 11.5 - 15.5 %   Platelets 252 150 - 400 K/uL   nRBC 0.0 0.0 - 0.2 %    Comment: Performed at [facility], [address], [place], [place] [address]    CT ABDOMEN PELVIS WO CONTRAST  Result Date: 07/23/2022 CLINICAL DATA:  Concern for osteomyelitis of the left iliac wing. Fever. EXAM: CT ABDOMEN AND PELVIS WITHOUT CONTRAST TECHNIQUE: Multidetector CT imaging of the abdomen and pelvis was performed following the standard protocol without IV contrast. RADIATION DOSE REDUCTION: This exam was performed according to the departmental dose-optimization program which includes automated exposure control, adjustment of the mA and/or kV according to patient size and/or use of iterative reconstruction technique. COMPARISON:  CT abdomen and pelvis 08/25/2021. Pelvis CT 02/25/2022. FINDINGS: Lower chest: There is atelectasis in both lung bases, right greater than left. The heart is enlarged. There is a small pericardial effusion. Hepatobiliary: No focal liver abnormality is seen. No gallstones, gallbladder wall thickening, or biliary dilatation. Pancreas: Unremarkable. No  pancreatic ductal dilatation or surrounding inflammatory changes. Spleen: Normal in size without focal abnormality. Adrenals/Urinary Tract: There is bilateral renal atrophy. There is a 12 mm cyst in the superior pole the left kidney. There is no hydronephrosis or urinary tract calculi. The adrenal glands are within normal limits. The bladder is decompressed by suprapubic catheter. Stomach/Bowel: Stomach is within normal limits. Appendix appears normal. No evidence of bowel wall thickening, distention, or inflammatory changes. Vascular/Lymphatic: Aortic atherosclerosis. No enlarged abdominal or pelvic lymph nodes. Reproductive: Uterus and bilateral adnexa are unremarkable. Other: No abdominal wall hernia or abnormality. No abdominopelvic ascites. Musculoskeletal: Previously identified fluid collection adjacent to the distal sacrum and coccyx has mildly decreased in size contains more internal calcifications. This measures 2.0 x 4.4 by 4.2 cm. There is some erosive changes of the adjacent coccyx, similar to prior study. Decubitus ulcer adjacent to the posterior aspect of the greater trochanter of the left femur is new. There is a new underlying minimal cortical irregularity worrisome for osteomyelitis. Decubitus ulcer overlying the bilateral ischia are unchanged with some underlying cortical erosions, also unchanged. There is a new right hip joint effusion. No acute fractures are seen. IMPRESSION: 1. New decubitus ulcer adjacent to the greater trochanter of the left femur with underlying minimal cortical irregularity worrisome for osteomyelitis. 2. New right hip joint effusion. Correlate clinically for septic arthritis given the clinical history. 3. Decubitus ulcer overlying the bilateral ischia with chronic osteomyelitis appear unchanged. 4. Fluid collection adjacent to the distal sacrum and coccyx has mildly decreased in size. There is stable erosive changes of the coccyx. 5. Bilateral renal atrophy. 6. Suprapubic  catheter in place. 7. Cardiomegaly with small pericardial effusion. Aortic Atherosclerosis (ICD10-I70.0). Electronically Signed   By: Ronney Asters M.D.   On: 07/23/2022 19:25   CT Knee Left Wo Contrast  Result Date: 07/23/2022 CLINICAL DATA:  Possible septic arthritis.  Draining wounds. EXAM: CT OF THE LEFT KNEE WITHOUT CONTRAST TECHNIQUE: Multidetector CT imaging of the left knee was performed according to the standard protocol. Multiplanar CT image reconstructions were also generated. RADIATION DOSE REDUCTION: This exam was performed according to the departmental dose-optimization program which includes automated exposure control, adjustment of the mA and/or kV according to patient size and/or use of iterative reconstruction technique. COMPARISON:  None Available. FINDINGS: Bones/Joint/Cartilage There  is mild diffuse decreased bone mineralization. Mild tricompartmental osteoarthritic change of the knee. No focal bone destruction to suggest osteomyelitis. No acute fracture or dislocation. Ligaments Suboptimally assessed by CT. Muscles and Tendons Unremarkable. Soft tissues There is no significant joint effusion. There is moderate subcutaneous edema within the soft tissues adjacent the knee. No definite focal subcutaneous fluid collection to suggest abscess. No air in the soft tissues. IMPRESSION: 1. Moderate subcutaneous edema within the soft tissues adjacent the knee. No definite focal fluid collection to suggest abscess. No air in the soft tissues. No bone destruction to suggest osteomyelitis. 2. Mild tricompartmental osteoarthritic change of the knee. Electronically Signed   By: Marin Olp M.D.   On: 07/23/2022 19:14   DG Chest Port 1 View  Result Date: 07/23/2022 CLINICAL DATA:  Sepsis. EXAM: PORTABLE CHEST 1 VIEW COMPARISON:  April 17, 2018. FINDINGS: Stable cardiomediastinal silhouette. Harrington rods are again noted in thoracic spine. No acute pulmonary disease is noted. Old right clavicular  fracture is noted. IMPRESSION: No active disease. Electronically Signed   By: Marijo Conception M.D.   On: 07/23/2022 18:12   DG Knee Left Port  Result Date: 07/23/2022 CLINICAL DATA:  Left knee swelling. EXAM: PORTABLE LEFT KNEE - 1-2 VIEW COMPARISON:  None Available. FINDINGS: No evidence of fracture, dislocation, or joint effusion. Mild narrowing of the medial and lateral joint spaces are noted. Soft tissues are unremarkable. IMPRESSION: Mild degenerative joint disease.  No acute abnormality seen. Electronically Signed   By: Marijo Conception M.D.   On: 07/23/2022 18:10    ROS: really negative Blood pressure (!) 149/67, pulse 94, temperature 98.2 F (36.8 C), temperature source Oral, resp. rate (!) 26, height '5\' 4"'$  (1.626 m), weight 61.2 kg, SpO2 94 %. General appearance: alert and no distress Resp: clear to auscultation bilaterally Cardio: regular rate and rhythm, S1, S2 normal, no murmur, click, rub or gallop GI: soft, non-tender; bowel sounds normal; no masses,  no organomegaly Extremities: edema trace to 1+ and paraplegia so contractured LEs Left upper AVF-  good thrill and bruit  also suprapubic catheter  Assessment/Plan: 57 year old hispanic female-  paraplegia and chronic wounds presents with complications related to those  1 wounds- per primary-  considering transfer to another facility but I do not see anything definitive 2 ESRD: normally MWF via AVF-  would be due today-  HD nurse has 3 other pts to run today-  will put in orders for today but may not be able to run til tomorrow-   there also is talk of a transfer-  will act accordingly to get her her dialysis at least by tomorrow if not today  3 Hypertension/vol: has a little edema and BP is not low-  left below EDW on Monday-  will UF as able  4. Anemia of ESRD: pretty low-  maybe bleeding from wounds-  will check iron and replete as able and cont ESA 5. Metabolic Bone Disease: continue home dose of calcitriol -  listed on phoslo-   will cont that as well    Louis Meckel 07/24/2022, 10:45 AM

## 2022-07-24 NOTE — Progress Notes (Signed)
Progress Note   Patient: [name] [id number] DOB: May 18, 1966 DOA: 07/23/2022     1 DOS: the patient was seen and examined on 07/24/2022   Brief hospital course: 57 year old woman PMH including paraplegia, chronic suprapubic catheter, seizure disorder, ESRD on hemodialysis presented with increased redness and drainage from decubitus ulcers.  Noted to be febrile with leukocytosis.  Admitted for sepsis secondary to suspected acute osteomyelitis left hip, also new right hip effusion cannot rule out septic arthritis, bilateral sacral decubitus, left knee cellulitis.  Assessment and Plan: Sepsis secondary to suspected infected left decubitus ulcer with osteomyelitis, bilateral sacral wounds, with new right hip effusion cannot rule out septic arthritis, left knee cellulitis without evidence of septic arthritis or effusion. Febrile in the emergency department with leukocytosis and tachycardic.  Hemodynamics overall stable at this time.  Will downgrade to medical bed. Continue empiric antibiotics. CT concerning for left hip osteomyelitis and new right hip joint effusion, cannot rule out septic arthritis.   History and exam complicated by paraplegia with insensate lower extremities. I discussed the case with Dr. Mable Fill orthopedics this morning.  He recommended transfer to a higher level of care for comprehensive complex care including orthopedics, general surgery or plastics. I placed a call to [facility] as the patient receives her complex wound care there.  This was discussed with patient and daughter.  I await a call back.  Left knee cellulitis CT no evidence of septic arthritis or joint effusion.  Exam is limited by paraplegia. Continue empiric antibiotics.  Monitor.   ESRD  Status post hemodialysis 2/26 without issue she reports. Potassium is within normal limits.  No urgent need for dialysis.  Nephrology consulted.   Hyponatremia  Stable.  Related to ESRD.  Monitor.   Paraplegia   Continue current management.   Essential hypertension  Continue Norvasc, clonidine, and Coreg as tolerated     Seizures  Continue Keppra    Normocytic anemia  Stable.  Patient will except transfusion if needed.      Subjective:  Feels ok today Breathing ok  Physical Exam: Vitals:   07/24/22 0500 07/24/22 0600 07/24/22 0615 07/24/22 0628  BP: (!) 146/56 (!) 149/67    Pulse: 95 93 94   Resp: (!) 27 (!) 31 (!) 26   Temp:    98.2 F (36.8 C)  TempSrc:    Oral  SpO2: 93% 94% 94%   Weight:      Height:       Physical Exam Vitals reviewed.  Constitutional:      General: She is not in acute distress.    Appearance: She is not ill-appearing or toxic-appearing.  Cardiovascular:     Rate and Rhythm: Normal rate and regular rhythm.     Heart sounds: No murmur heard. Pulmonary:     Effort: Pulmonary effort is normal. No respiratory distress.     Breath sounds: No wheezing, rhonchi or rales.  Skin:    Comments: Skin over the left knee is edematous with erythema and warmth.  Neurological:     Mental Status: She is alert.  Psychiatric:        Mood and Affect: Mood normal.        Behavior: Behavior normal.     Data Reviewed: Sodium 129 stable Stable 4.59 Hemoglobin stable at 7.1 WBC 24.1 now 20.1  Family Communication: daughter by phone  Disposition: Status is: Inpatient Remains inpatient appropriate because: sepsis  Planned Discharge Destination:  TBD    Time spent: 60 minutes  Author: Murray Hodgkins, MD 07/24/2022 9:48 AM  For on call review [email or web address].

## 2022-07-24 NOTE — Progress Notes (Signed)
Verbal report given to [facility] ETA 20 minutes.

## 2022-07-24 NOTE — ED Notes (Signed)
Pt went to dialysis.

## 2022-07-24 NOTE — Progress Notes (Signed)
Received patient in bed to unit.  Alert and oriented.  Informed consent signed and in chart.   TX duration:2 hrs. Pt cut off 1 hour d/t tired. AMA form signed. Dr. Moshe Cipro notified.  Patient tolerated well.  Transported back to the room  Alert, without acute distress.  Hand-off given to patient's nurse.   Access used: AVF Access issues: none  Total UF removed: 1.1 L Medication(s) given: none Post HD VS: 169/70   P 88 R 16 O2 sat 98 % in room air. Post HD weight: 60 Kg   [facility]

## 2022-07-24 NOTE — Hospital Course (Addendum)
57 year old woman PMH including paraplegia, chronic suprapubic catheter, seizure disorder, ESRD on hemodialysis presented with increased redness and drainage from decubitus ulcers.  Noted to be febrile with leukocytosis.  Admitted for sepsis secondary to suspected acute osteomyelitis left hip, also new right hip effusion cannot rule out septic arthritis, bilateral sacral decubitus, left knee cellulitis.

## 2022-07-25 DIAGNOSIS — L89224 Pressure ulcer of left hip, stage 4: Secondary | ICD-10-CM | POA: Insufficient documentation

## 2022-07-25 LAB — HEPATITIS B SURFACE ANTIBODY, QUANTITATIVE: Hep B S AB Quant (Post): 292.1 m[IU]/mL (ref >9.9)

## 2022-07-25 LAB — HEPATITIS B E ANTIGEN: Hep B E Ag: NEGATIVE

## 2022-07-25 NOTE — Psychosocial Assessment (Signed)
Verbal report given to receiving nurse at [facility]. [facility]), EMS has not yet arrived. Patient is aware of transfer.

## 2022-07-25 NOTE — Progress Notes (Signed)
Patient belongings sent with patient. (Clothing and cell phone)

## 2022-07-25 NOTE — Progress Notes (Signed)
Patient has left the building in stable condition with [facility].

## 2022-07-26 NOTE — Discharge Summary (Addendum)
Physician Discharge Summary   Patient: [name] MRN: [id number] DOB: November 17, 1965  Admit date:     07/23/2022  Discharge date: 07/25/2022  Discharge Physician: Murray Hodgkins   PCP: Karie Georges, [place]   Transferred to [facility]   Discharge Diagnoses: Principal Problem:   Infected decubitus ulcer Active Problems:   Paraplegia ([place])   Suprapubic catheter ([place])   Seizure disorder ([place])   Essential hypertension   Sepsis ([place])   Chronic anemia   ESRD (end stage renal disease) ([place])   Cellulitis of knee, left  Resolved Problems:   * No resolved hospital problems. *  Hospital Course: 57 year old woman PMH including paraplegia, chronic suprapubic catheter, seizure disorder, ESRD on hemodialysis presented with increased redness and drainage from decubitus ulcers.  Noted to be febrile with leukocytosis.  Admitted for sepsis secondary to suspected acute osteomyelitis left hip, also new right hip effusion cannot rule out septic arthritis, bilateral sacral decubitus, left knee cellulitis.  Discussed with orthopedics with recommendation for transfer to higher level of care where the wound care team is available as well as surgery, possibly plastic surgery and orthopedics available to intervene.  Sepsis secondary to suspected infected left decubitus ulcer with osteomyelitis, bilateral sacral wounds, with new right hip effusion cannot rule out septic arthritis, left knee cellulitis without evidence of septic arthritis or effusion. Febrile in the emergency department with leukocytosis and tachycardic.  Hemodynamics overall stable at this time.  Will downgrade to medical bed. Continue empiric antibiotics. CT concerning for left hip osteomyelitis and new right hip joint effusion, cannot rule out septic arthritis.   History and exam complicated by paraplegia with insensate lower extremities. I discussed the case with Dr. Mable Fill orthopedics this morning.  He recommended transfer to a  higher level of care for comprehensive complex care including orthopedics, general surgery or plastics. I placed a call to [facility] as the patient receives her complex wound care there.  This was discussed with patient and daughter.  [facility] pateint.   Left knee cellulitis CT no evidence of septic arthritis or joint effusion.  Exam is limited by paraplegia. Continue empiric antibiotics.  Monitor.   ESRD  Status post hemodialysis 2/26 without issue she reports. Potassium is within normal limits.  No urgent need for dialysis.  Nephrology consulted.   Hyponatremia  Stable.  Related to ESRD.  Monitor.   Paraplegia  Continue current management.   Essential hypertension  Continue Norvasc, clonidine, and Coreg as tolerated     Seizures  Continue Keppra    Normocytic anemia  Stable.  Patient will except transfusion if needed.  Pressure injury right buttock stage 4, present on admission     Consultants:  Procedures performed: None  Disposition:  [facility] Diet recommendation:   DISCHARGE MEDICATION: Allergies as of 07/25/2022       Reactions   Benadryl [diphenhydramine Hcl (sleep)] Hives   Quinine Derivatives Other (See Comments)   Alters mental status   Vancomycin    Pt is tolerating this medication at HD   Azithromycin Itching, Rash   Tetracycline Itching   Able to tolerate Doxycycline.    Zosyn [piperacillin Sod-tazobactam So] Rash        Medication List     TAKE these medications    acetaminophen 500 MG tablet Commonly known as: TYLENOL Take 1,000 mg by mouth every 6 (six) hours as needed for moderate pain.   amLODipine 5 MG tablet Commonly known as: NORVASC Take 5 mg by mouth See admin  instructions. Take 1 tablet on Monday,Wednesday and Friday then take 2 tablets on Tuesday,Thursday,Saturday and Sunday.   calcium acetate 667 MG capsule Commonly known as: PHOSLO Take 1,334 mg by mouth 3 (three) times daily.   carvedilol 6.25 MG  tablet Commonly known as: COREG Take 6.25 mg by mouth 2 (two) times daily with a meal.   cloNIDine 0.1 MG tablet Commonly known as: CATAPRES Take by mouth.   cyclobenzaprine 5 MG tablet Commonly known as: FLEXERIL Take 5 mg by mouth 3 (three) times daily as needed.   docusate sodium 100 MG capsule Commonly known as: COLACE Take 100 mg by mouth at bedtime. What changed: Another medication with the same name was removed. Continue taking this medication, and follow the directions you see here.   furosemide 20 MG tablet Commonly known as: LASIX Take 20 mg by mouth daily.   levETIRAcetam 500 MG tablet Commonly known as: KEPPRA Take 1,000 mg by mouth daily.   lidocaine 5 % ointment Commonly known as: XYLOCAINE Apply topically.   lidocaine-prilocaine cream Commonly known as: EMLA Apply 1 Application topically 3 (three) times a week.   multivitamin tablet Take 1 tablet by mouth daily.   RENA-VITE RX PO Take 1 tablet by mouth daily.   sevelamer carbonate 800 MG tablet Commonly known as: RENVELA Take 800 mg by mouth 3 (three) times daily with meals.        Discharge Exam: See progress note same day  Condition at discharge: good  The results of significant diagnostics from this hospitalization (including imaging, microbiology, ancillary and laboratory) are listed below for reference.   Imaging Studies: CT ABDOMEN PELVIS WO CONTRAST  Result Date: 07/23/2022 CLINICAL DATA:  Concern for osteomyelitis of the left iliac wing. Fever. EXAM: CT ABDOMEN AND PELVIS WITHOUT CONTRAST TECHNIQUE: Multidetector CT imaging of the abdomen and pelvis was performed following the standard protocol without IV contrast. RADIATION DOSE REDUCTION: This exam was performed according to the departmental dose-optimization program which includes automated exposure control, adjustment of the mA and/or kV according to patient size and/or use of iterative reconstruction technique. COMPARISON:  CT  abdomen and pelvis 08/25/2021. Pelvis CT 02/25/2022. FINDINGS: Lower chest: There is atelectasis in both lung bases, right greater than left. The heart is enlarged. There is a small pericardial effusion. Hepatobiliary: No focal liver abnormality is seen. No gallstones, gallbladder wall thickening, or biliary dilatation. Pancreas: Unremarkable. No pancreatic ductal dilatation or surrounding inflammatory changes. Spleen: Normal in size without focal abnormality. Adrenals/Urinary Tract: There is bilateral renal atrophy. There is a 12 mm cyst in the superior pole the left kidney. There is no hydronephrosis or urinary tract calculi. The adrenal glands are within normal limits. The bladder is decompressed by suprapubic catheter. Stomach/Bowel: Stomach is within normal limits. Appendix appears normal. No evidence of bowel wall thickening, distention, or inflammatory changes. Vascular/Lymphatic: Aortic atherosclerosis. No enlarged abdominal or pelvic lymph nodes. Reproductive: Uterus and bilateral adnexa are unremarkable. Other: No abdominal wall hernia or abnormality. No abdominopelvic ascites. Musculoskeletal: Previously identified fluid collection adjacent to the distal sacrum and coccyx has mildly decreased in size contains more internal calcifications. This measures 2.0 x 4.4 by 4.2 cm. There is some erosive changes of the adjacent coccyx, similar to prior study. Decubitus ulcer adjacent to the posterior aspect of the greater trochanter of the left femur is new. There is a new underlying minimal cortical irregularity worrisome for osteomyelitis. Decubitus ulcer overlying the bilateral ischia are unchanged with some underlying cortical erosions, also unchanged. There  is a new right hip joint effusion. No acute fractures are seen. IMPRESSION: 1. New decubitus ulcer adjacent to the greater trochanter of the left femur with underlying minimal cortical irregularity worrisome for osteomyelitis. 2. New right hip joint  effusion. Correlate clinically for septic arthritis given the clinical history. 3. Decubitus ulcer overlying the bilateral ischia with chronic osteomyelitis appear unchanged. 4. Fluid collection adjacent to the distal sacrum and coccyx has mildly decreased in size. There is stable erosive changes of the coccyx. 5. Bilateral renal atrophy. 6. Suprapubic catheter in place. 7. Cardiomegaly with small pericardial effusion. Aortic Atherosclerosis (ICD10-I70.0). Electronically Signed   By: Ronney Asters M.D.   On: 07/23/2022 19:25   CT Knee Left Wo Contrast  Result Date: 07/23/2022 CLINICAL DATA:  Possible septic arthritis.  Draining wounds. EXAM: CT OF THE LEFT KNEE WITHOUT CONTRAST TECHNIQUE: Multidetector CT imaging of the left knee was performed according to the standard protocol. Multiplanar CT image reconstructions were also generated. RADIATION DOSE REDUCTION: This exam was performed according to the departmental dose-optimization program which includes automated exposure control, adjustment of the mA and/or kV according to patient size and/or use of iterative reconstruction technique. COMPARISON:  None Available. FINDINGS: Bones/Joint/Cartilage There is mild diffuse decreased bone mineralization. Mild tricompartmental osteoarthritic change of the knee. No focal bone destruction to suggest osteomyelitis. No acute fracture or dislocation. Ligaments Suboptimally assessed by CT. Muscles and Tendons Unremarkable. Soft tissues There is no significant joint effusion. There is moderate subcutaneous edema within the soft tissues adjacent the knee. No definite focal subcutaneous fluid collection to suggest abscess. No air in the soft tissues. IMPRESSION: 1. Moderate subcutaneous edema within the soft tissues adjacent the knee. No definite focal fluid collection to suggest abscess. No air in the soft tissues. No bone destruction to suggest osteomyelitis. 2. Mild tricompartmental osteoarthritic change of the knee.  Electronically Signed   By: Marin Olp M.D.   On: 07/23/2022 19:14   DG Chest Port 1 View  Result Date: 07/23/2022 CLINICAL DATA:  Sepsis. EXAM: PORTABLE CHEST 1 VIEW COMPARISON:  April 17, 2018. FINDINGS: Stable cardiomediastinal silhouette. Harrington rods are again noted in thoracic spine. No acute pulmonary disease is noted. Old right clavicular fracture is noted. IMPRESSION: No active disease. Electronically Signed   By: Marijo Conception M.D.   On: 07/23/2022 18:12   DG Knee Left Port  Result Date: 07/23/2022 CLINICAL DATA:  Left knee swelling. EXAM: PORTABLE LEFT KNEE - 1-2 VIEW COMPARISON:  None Available. FINDINGS: No evidence of fracture, dislocation, or joint effusion. Mild narrowing of the medial and lateral joint spaces are noted. Soft tissues are unremarkable. IMPRESSION: Mild degenerative joint disease.  No acute abnormality seen. Electronically Signed   By: Marijo Conception M.D.   On: 07/23/2022 18:10    Microbiology: Results for orders placed or performed during the hospital encounter of 07/23/22  Resp panel by RT-PCR (RSV, Flu A&B, Covid) Anterior Nasal Swab     Status: None   Collection Time: 07/23/22  5:30 PM   Specimen: Anterior Nasal Swab  Result Value Ref Range Status   SARS Coronavirus 2 by RT PCR NEGATIVE NEGATIVE Final    Comment: (NOTE) SARS-CoV-2 target nucleic acids are NOT DETECTED.  The SARS-CoV-2 RNA is generally detectable in upper respiratory specimens during the acute phase of infection. The lowest concentration of SARS-CoV-2 viral copies this assay can detect is 138 copies/mL. A negative result does not preclude SARS-Cov-2 infection and should not be used as the sole  basis for treatment or other patient management decisions. A negative result may occur with  improper specimen collection/handling, submission of specimen other than nasopharyngeal swab, presence of viral mutation(s) within the areas targeted by this assay, and inadequate number of  viral copies(<138 copies/mL). A negative result must be combined with clinical observations, patient history, and epidemiological information. The expected result is Negative.  Fact Sheet for Patients:  EntrepreneurPulse.com.au  Fact Sheet for Healthcare Providers:  IncredibleEmployment.be  This test is no t yet approved or cleared by the [place] FDA and  has been authorized for detection and/or diagnosis of SARS-CoV-2 by FDA under an Emergency Use Authorization (EUA). This EUA will remain  in effect (meaning this test can be used) for the duration of the COVID-19 declaration under Section 564(b)(1) of the Act, 21 U.S.C.section 360bbb-3(b)(1), unless the authorization is terminated  or revoked sooner.       Influenza A by PCR NEGATIVE NEGATIVE Final   Influenza B by PCR NEGATIVE NEGATIVE Final    Comment: (NOTE) The Xpert Xpress SARS-CoV-2/FLU/RSV plus assay is intended as an aid in the diagnosis of influenza from Nasopharyngeal swab specimens and should not be used as a sole basis for treatment. Nasal washings and aspirates are unacceptable for Xpert Xpress SARS-CoV-2/FLU/RSV testing.  Fact Sheet for Patients: EntrepreneurPulse.com.au  Fact Sheet for Healthcare Providers: IncredibleEmployment.be  This test is not yet approved or cleared by the [place] FDA and has been authorized for detection and/or diagnosis of SARS-CoV-2 by FDA under an Emergency Use Authorization (EUA). This EUA will remain in effect (meaning this test can be used) for the duration of the COVID-19 declaration under Section 564(b)(1) of the Act, 21 U.S.C. section 360bbb-3(b)(1), unless the authorization is terminated or revoked.     Resp Syncytial Virus by PCR NEGATIVE NEGATIVE Final    Comment: (NOTE) Fact Sheet for Patients: EntrepreneurPulse.com.au  Fact Sheet for Healthcare  Providers: IncredibleEmployment.be  This test is not yet approved or cleared by the [place] FDA and has been authorized for detection and/or diagnosis of SARS-CoV-2 by FDA under an Emergency Use Authorization (EUA). This EUA will remain in effect (meaning this test can be used) for the duration of the COVID-19 declaration under Section 564(b)(1) of the Act, 21 U.S.C. section 360bbb-3(b)(1), unless the authorization is terminated or revoked.  Performed at [facility], [address] [place], [place] [address]   Blood Culture (routine x 2)     Status: None (Preliminary result)   Collection Time: 07/23/22  5:34 PM   Specimen: Right Antecubital; Blood  Result Value Ref Range Status   Specimen Description RIGHT ANTECUBITAL  Final   Special Requests   Final    BOTTLES DRAWN AEROBIC AND ANAEROBIC Blood Culture adequate volume   Culture   Final    NO GROWTH 3 DAYS Performed at [facility], [address] [place], [place] [address]    Report Status PENDING  Incomplete  Blood Culture (routine x 2)     Status: None (Preliminary result)   Collection Time: 07/23/22  5:52 PM   Specimen: BLOOD  Result Value Ref Range Status   Specimen Description BLOOD BLOOD RIGHT ARM  Final   Special Requests   Final    BOTTLES DRAWN AEROBIC AND ANAEROBIC Blood Culture adequate volume   Culture   Final    NO GROWTH 3 DAYS Performed at [facility], [address] [place], [place] [address]    Report Status PENDING  Incomplete  Urine Culture (  for pregnant, neutropenic or urologic patients or patients with an indwelling urinary catheter)     Status: Abnormal (Preliminary result)   Collection Time: 07/23/22  9:22 PM   Specimen: Urine, Clean Catch  Result Value Ref Range Status   Specimen Description   Final    URINE, CLEAN CATCH Performed at [facility], [address], [place], [place] [address]    Special Requests   Final    NONE Performed at [facility], [address], [place], [place] [address]    Culture (A)  Final    >=100,000 COLONIES/mL PROTEUS MIRABILIS CULTURE REINCUBATED FOR BETTER GROWTH Performed at [facility] [address] [place], [place] [address]    Report Status PENDING  Incomplete   Organism ID, Bacteria PROTEUS MIRABILIS (A)  Final      Susceptibility   Proteus mirabilis - MIC*    AMPICILLIN <=2 SENSITIVE Sensitive     CEFAZOLIN 8 SENSITIVE Sensitive     CEFEPIME <=0.12 SENSITIVE Sensitive     CEFTRIAXONE <=0.25 SENSITIVE Sensitive     CIPROFLOXACIN <=0.25 SENSITIVE Sensitive     GENTAMICIN <=1 SENSITIVE Sensitive     IMIPENEM 2 SENSITIVE Sensitive     NITROFURANTOIN 128 RESISTANT Resistant     TRIMETH/SULFA <=20 SENSITIVE Sensitive     AMPICILLIN/SULBACTAM <=2 SENSITIVE Sensitive     PIP/TAZO <=4 SENSITIVE Sensitive     * >=100,000 COLONIES/mL PROTEUS MIRABILIS    Labs: CBC: Recent Labs  Lab 07/23/22 1734 07/24/22 0429  WBC 24.1* 20.1*  NEUTROABS 21.0*  --   HGB 7.8* 7.1*  HCT 25.9* 23.5*  MCV 96.3 97.1  PLT 266 [id number]   Basic Metabolic Panel: Recent Labs  Lab 07/23/22 1734 07/24/22 0429  NA 129* 129*  K 3.4* 4.1  CL 88* 96*  CO2 26 26  GLUCOSE 109* 89  BUN 35* 40*  CREATININE 4.59* 4.59*  CALCIUM 8.5* 8.0*  PHOS  --  2.7   Liver Function Tests: Recent Labs  Lab 07/23/22 1734  AST 14*  ALT 10  ALKPHOS 98  BILITOT 0.7  PROT 7.3  ALBUMIN 2.5*   CBG: Recent Labs  Lab 07/24/22 0239  GLUCAP 105*    Discharge time spent: greater than 30 minutes.  Signed: Murray Hodgkins, MD [facility] 07/26/2022

## 2022-07-27 LAB — URINE CULTURE: Culture: >=100000 — AB

## 2022-07-28 LAB — CULTURE, BLOOD (ROUTINE X 2)
Culture: NO GROWTH
Culture: NO GROWTH
Special Requests: ADEQUATE
Special Requests: ADEQUATE

## 2022-08-10 ENCOUNTER — Emergency Department (HOSPITAL_COMMUNITY): Payer: 59

## 2022-08-10 ENCOUNTER — Emergency Department (HOSPITAL_COMMUNITY)
Admission: EM | Admit: 2022-08-10 | Discharge: 2022-08-10 | Disposition: A | Discharge status: Home / Self Care | Payer: 59 | Attending: Emergency Medicine | Admitting: Emergency Medicine

## 2022-08-10 ENCOUNTER — Encounter (HOSPITAL_COMMUNITY): Payer: Self-pay

## 2022-08-10 ENCOUNTER — Other Ambulatory Visit: Payer: Self-pay

## 2022-08-10 DIAGNOSIS — Z992 Dependence on renal dialysis: Secondary | ICD-10-CM | POA: Insufficient documentation

## 2022-08-10 DIAGNOSIS — X58XXXA Exposure to other specified factors, initial encounter: Secondary | ICD-10-CM | POA: Diagnosis not present

## 2022-08-10 DIAGNOSIS — S8002XA Contusion of left knee, initial encounter: Secondary | ICD-10-CM

## 2022-08-10 DIAGNOSIS — M25562 Pain in left knee: Secondary | ICD-10-CM | POA: Diagnosis present

## 2022-08-10 LAB — COMPREHENSIVE METABOLIC PANEL
ALT: 16 U/L (ref 0–44)
AST: 31 U/L (ref 15–41)
Albumin: 2.8 g/dL — ABNORMAL LOW (ref 3.5–5.0)
Alkaline Phosphatase: 72 U/L (ref 38–126)
Anion gap: 10 (ref 5–15)
BUN: 31 mg/dL — ABNORMAL HIGH (ref 6–20)
CO2: 27 mmol/L (ref 22–32)
Calcium: 8.6 mg/dL — ABNORMAL LOW (ref 8.9–10.3)
Chloride: 99 mmol/L (ref 98–111)
Creatinine, Ser: 4.25 mg/dL — ABNORMAL HIGH (ref 0.44–1.00)
GFR, Estimated: 12 mL/min — ABNORMAL LOW (ref >60)
Glucose, Bld: 97 mg/dL (ref 70–99)
Potassium: 4.1 mmol/L (ref 3.5–5.1)
Sodium: 136 mmol/L (ref 135–145)
Total Bilirubin: 0.7 mg/dL (ref 0.3–1.2)
Total Protein: 8.5 g/dL — ABNORMAL HIGH (ref 6.5–8.1)

## 2022-08-10 LAB — CBC WITH DIFFERENTIAL/PLATELET
Abs Immature Granulocytes: 0.03 10*3/uL (ref 0.00–0.07)
Basophils Absolute: 0.1 10*3/uL (ref 0.0–0.1)
Basophils Relative: 2 %
Eosinophils Absolute: 0.3 10*3/uL (ref 0.0–0.5)
Eosinophils Relative: 4 %
HCT: 32.6 % — ABNORMAL LOW (ref 36.0–46.0)
Hemoglobin: 9.6 g/dL — ABNORMAL LOW (ref 12.0–15.0)
Immature Granulocytes: 1 %
Lymphocytes Relative: 20 %
Lymphs Abs: 1.3 10*3/uL (ref 0.7–4.0)
MCH: 30.4 pg (ref 26.0–34.0)
MCHC: 29.4 g/dL — ABNORMAL LOW (ref 30.0–36.0)
MCV: 103.2 fL — ABNORMAL HIGH (ref 80.0–100.0)
Monocytes Absolute: 0.5 10*3/uL (ref 0.1–1.0)
Monocytes Relative: 8 %
Neutro Abs: 4.2 10*3/uL (ref 1.7–7.7)
Neutrophils Relative %: 65 %
Platelets: 303 10*3/uL (ref 150–400)
RBC: 3.16 MIL/uL — ABNORMAL LOW (ref 3.87–5.11)
RDW: 22.1 % — ABNORMAL HIGH (ref 11.5–15.5)
WBC: 6.4 10*3/uL (ref 4.0–10.5)
nRBC: 0 % (ref 0.0–0.2)

## 2022-08-10 NOTE — Discharge Instructions (Addendum)
Avoid having direct pressure on area as this could cause skin breakdown

## 2022-08-10 NOTE — ED Provider Notes (Signed)
[facility] Provider Note   CSN: [id number] Arrival date & time: 08/10/22  1601     History  Chief Complaint  Patient presents with   Knee Pain    Left knee pain, redness noted and swelling noted.     [name] is a 57 y.o. female.  Patient complains of redness to her left knee.  Patient has a past medical history of paraplegia decubitus sacral wounds acute renal failure.  Patient reports there is 2 swollen areas to her left knee which are new.  She denies any fever or chills.  Patient reports that she may have slept with her leg up against the bed.  Patient was recently hospitalized here on February 29 and transferred to [facility] in [place] for wound consultation.  Patient reports sacral wounds are improving patient is in wound care.  The history is provided by the patient. No language interpreter was used.  Knee Pain Location:  Knee Time since incident:  1 day Injury: no   Knee location:  L knee Chronicity:  New Relieved by:  Nothing Worsened by:  Nothing Ineffective treatments:  None tried Associated symptoms: no fever   Risk factors: no recent illness        Home Medications Prior to Admission medications   Medication Sig Start Date End Date Taking? Authorizing Provider  acetaminophen (TYLENOL) 500 MG tablet Take 1,000 mg by mouth every 6 (six) hours as needed for moderate pain.    [provider]  amLODipine (NORVASC) 5 MG tablet Take 5 mg by mouth See admin instructions. Take 1 tablet on Monday,Wednesday and Friday then take 2 tablets on Tuesday,Thursday,Saturday and Sunday. 11/24/18   [provider]  B Complex-C-Folic Acid (RENA-VITE RX PO) Take 1 tablet by mouth daily.    [provider]  calcium acetate (PHOSLO) 667 MG capsule Take 1,334 mg by mouth 3 (three) times daily. Patient not taking: Reported on 07/23/2022 01/30/22   [provider]  carvedilol (COREG) 6.25 MG tablet  Take 6.25 mg by mouth 2 (two) times daily with a meal.    [provider]  cloNIDine (CATAPRES) 0.1 MG tablet Take by mouth. 12/19/21   [provider]  cyclobenzaprine (FLEXERIL) 5 MG tablet Take 5 mg by mouth 3 (three) times daily as needed. 03/18/22   [provider]  docusate sodium (COLACE) 100 MG capsule Take 100 mg by mouth at bedtime.    [provider]  furosemide (LASIX) 20 MG tablet Take 20 mg by mouth daily. 08/14/21   [provider]  levETIRAcetam (KEPPRA) 500 MG tablet Take 1,000 mg by mouth daily. 08/09/21   [provider]  lidocaine (XYLOCAINE) 5 % ointment Apply topically. Patient not taking: Reported on 07/23/2022 05/10/22   [provider]  lidocaine-prilocaine (EMLA) cream Apply 1 Application topically 3 (three) times a week. 06/04/22   [provider]  Multiple Vitamin (MULTIVITAMIN) tablet Take 1 tablet by mouth daily.    [provider]  sevelamer carbonate (RENVELA) 800 MG tablet Take 800 mg by mouth 3 (three) times daily with meals.    [provider]      Allergies    Benadryl [diphenhydramine hcl (sleep)], Quinine derivatives, Vancomycin, Azithromycin, Tetracycline, and Zosyn [piperacillin sod-tazobactam so]    Review of Systems   Review of Systems  Constitutional:  Negative for fever.  All other systems reviewed and are negative.   Physical Exam Updated Vital Signs BP Marland Kitchen)  156/91   Pulse 80   Temp 97.8 F (36.6 C) (Oral)   Resp (!) 24   LMP  (LMP Unknown)   SpO2 98%  Physical Exam Vitals and nursing note reviewed.  Constitutional:      Appearance: She is well-developed.  HENT:     Head: Normocephalic.  Cardiovascular:     Rate and Rhythm: Normal rate.  Pulmonary:     Effort: Pulmonary effort is normal.  Abdominal:     General: There is no distension.  Musculoskeletal:     Cervical back: Normal range of motion.     Comments: 2 swollen fluctuant areas anterior  left knee, no joint effusion no streaking  Neurological:     Mental Status: She is alert and oriented to person, place, and time.  Psychiatric:        Mood and Affect: Mood normal.     ED Results / Procedures / Treatments   Labs (all labs ordered are listed, but only abnormal results are displayed) Labs Reviewed  CBC WITH DIFFERENTIAL/PLATELET - Abnormal; Notable for the following components:      Result Value   RBC 3.16 (*)    Hemoglobin 9.6 (*)    HCT 32.6 (*)    MCV 103.2 (*)    MCHC 29.4 (*)    RDW 22.1 (*)    All other components within normal limits  COMPREHENSIVE METABOLIC PANEL - Abnormal; Notable for the following components:   BUN 31 (*)    Creatinine, Ser 4.25 (*)    Calcium 8.6 (*)    Total Protein 8.5 (*)    Albumin 2.8 (*)    GFR, Estimated 12 (*)    All other components within normal limits  CULTURE, BLOOD (ROUTINE X 2)  CULTURE, BLOOD (ROUTINE X 2)  AEROBIC CULTURE W GRAM STAIN (SUPERFICIAL SPECIMEN)    EKG None  Radiology DG Knee Complete 4 Views Left  Result Date: 08/10/2022 CLINICAL DATA:  Swelling and pain EXAM: LEFT KNEE - COMPLETE 4+ VIEW COMPARISON:  07/23/2022 FINDINGS: Bones are osteopenic. No acute displaced fracture, malalignment, or large effusion. Mild soft tissue swelling anteriorly over the knee. Atrophy of the extremity soft tissues. IMPRESSION: 1. Osteopenia and soft tissue swelling. 2. No acute osseous finding by plain radiography. Electronically Signed   By: Jerilynn Mages.  Shick M.D.   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  Amount and/or Complexity of Data Reviewed Independent Historian: EMS    Details: Brought to the emergency department by EMS External Data Reviewed: notes.     Details: Hospitalist notes reviewed [facility] wound care notes reviewed Labs: ordered. Decision-making details documented in ED Course.    Details: Labs ordered reviewed and interpreted Patient has normal white blood cell count  Potassium is normal Radiology: ordered.   Procedure: Betadine prep x 3 to both swollen areas, saline rinse to area, I aspirated individual areas with 20-gauge needle, patient had bloody drainage from both wounds consistent with hematoma.  I suspect patient has hematoma from leg being against the bed.        Final Clinical Impression(s) /  ED Diagnoses Final diagnoses:  Traumatic hematoma of left knee, initial encounter    Rx / DC Orders ED Discharge Orders     None     An After Visit Summary was printed and given to the patient.     Fransico Meadow, [place] 08/10/22 1827    Noemi Chapel, MD 08/11/22 [phone]

## 2022-08-10 NOTE — ED Triage Notes (Signed)
Pt was in the hospital a couple weeks ago d/t fluid collection on her left knee, fluid was drawn off at this time. Pt is c/o increased swelling, redness, and pain to left knee. Is a dialysis pt.

## 2022-08-13 LAB — AEROBIC CULTURE W GRAM STAIN (SUPERFICIAL SPECIMEN): Culture: NO GROWTH

## 2022-08-15 LAB — CULTURE, BLOOD (ROUTINE X 2)
Culture: NO GROWTH
Culture: NO GROWTH
Special Requests: ADEQUATE
Special Requests: ADEQUATE

## 2022-09-18 ENCOUNTER — Encounter: Payer: Self-pay | Admitting: Gastroenterology

## 2022-10-12 ENCOUNTER — Emergency Department (HOSPITAL_COMMUNITY): Payer: 59

## 2022-10-12 ENCOUNTER — Other Ambulatory Visit: Payer: Self-pay

## 2022-10-12 ENCOUNTER — Emergency Department (HOSPITAL_COMMUNITY)
Admission: EM | Admit: 2022-10-12 | Discharge: 2022-10-13 | Disposition: A | Discharge status: Home / Self Care | Payer: 59 | Attending: Emergency Medicine | Admitting: Emergency Medicine

## 2022-10-12 DIAGNOSIS — E875 Hyperkalemia: Secondary | ICD-10-CM | POA: Insufficient documentation

## 2022-10-12 DIAGNOSIS — Z79899 Other long term (current) drug therapy: Secondary | ICD-10-CM | POA: Diagnosis not present

## 2022-10-12 DIAGNOSIS — Z992 Dependence on renal dialysis: Secondary | ICD-10-CM | POA: Insufficient documentation

## 2022-10-12 DIAGNOSIS — E871 Hypo-osmolality and hyponatremia: Secondary | ICD-10-CM | POA: Insufficient documentation

## 2022-10-12 DIAGNOSIS — J209 Acute bronchitis, unspecified: Secondary | ICD-10-CM | POA: Insufficient documentation

## 2022-10-12 DIAGNOSIS — N186 End stage renal disease: Secondary | ICD-10-CM | POA: Diagnosis not present

## 2022-10-12 DIAGNOSIS — R059 Cough, unspecified: Secondary | ICD-10-CM | POA: Diagnosis present

## 2022-10-12 LAB — BASIC METABOLIC PANEL
Anion gap: 11 (ref 5–15)
BUN: 53 mg/dL — ABNORMAL HIGH (ref 6–20)
CO2: 23 mmol/L (ref 22–32)
Calcium: 8.8 mg/dL — ABNORMAL LOW (ref 8.9–10.3)
Chloride: 96 mmol/L — ABNORMAL LOW (ref 98–111)
Creatinine, Ser: 6.58 mg/dL — ABNORMAL HIGH (ref 0.44–1.00)
GFR, Estimated: 7 mL/min — ABNORMAL LOW (ref >60)
Glucose, Bld: 105 mg/dL — ABNORMAL HIGH (ref 70–99)
Potassium: 6 mmol/L — ABNORMAL HIGH (ref 3.5–5.1)
Sodium: 130 mmol/L — ABNORMAL LOW (ref 135–145)

## 2022-10-12 LAB — CBC
HCT: 33.5 % — ABNORMAL LOW (ref 36.0–46.0)
Hemoglobin: 9.8 g/dL — ABNORMAL LOW (ref 12.0–15.0)
MCH: 26.8 pg (ref 26.0–34.0)
MCHC: 29.3 g/dL — ABNORMAL LOW (ref 30.0–36.0)
MCV: 91.8 fL (ref 80.0–100.0)
Platelets: 290 10*3/uL (ref 150–400)
RBC: 3.65 MIL/uL — ABNORMAL LOW (ref 3.87–5.11)
RDW: 16.4 % — ABNORMAL HIGH (ref 11.5–15.5)
WBC: 8.7 10*3/uL (ref 4.0–10.5)
nRBC: 0 % (ref 0.0–0.2)

## 2022-10-12 LAB — HEPATITIS B SURFACE ANTIGEN: Hepatitis B Surface Ag: NONREACTIVE

## 2022-10-12 MED ORDER — CHLORHEXIDINE GLUCONATE CLOTH 2 % EX PADS: 6.0000 | MEDICATED_PAD | Freq: Every day | CUTANEOUS | Status: DC

## 2022-10-12 MED ORDER — ALBUTEROL SULFATE (2.5 MG/3ML) 0.083% IN NEBU
INHALATION_SOLUTION | RESPIRATORY_TRACT | Status: AC
  Administered 2022-10-12: 10 mg via RESPIRATORY_TRACT
  Filled 2022-10-12: qty 12

## 2022-10-12 MED ORDER — SODIUM POLYSTYRENE SULFONATE 15 GM/60ML PO SUSP
15.0000 g | Freq: Once | ORAL | Status: DC
  Filled 2022-10-12: qty 60

## 2022-10-12 MED ORDER — ALBUTEROL SULFATE (2.5 MG/3ML) 0.083% IN NEBU
10.0000 mg | INHALATION_SOLUTION | Freq: Once | RESPIRATORY_TRACT | Status: AC
  Filled 2022-10-12: qty 12

## 2022-10-12 MED ORDER — PREDNISONE 20 MG PO TABS: 40.0000 mg | ORAL_TABLET | Freq: Every day | ORAL | 0 refills | Status: DC

## 2022-10-12 MED ORDER — ALBUTEROL SULFATE HFA 108 (90 BASE) MCG/ACT IN AERS
2.0000 | INHALATION_SPRAY | RESPIRATORY_TRACT | Status: DC | PRN
  Administered 2022-10-12: 2 via RESPIRATORY_TRACT
  Filled 2022-10-12 (×2): qty 6.7

## 2022-10-12 MED ORDER — ALBUTEROL SULFATE HFA 108 (90 BASE) MCG/ACT IN AERS: 1.0000 | INHALATION_SPRAY | Freq: Four times a day (QID) | RESPIRATORY_TRACT | 0 refills | Status: DC | PRN

## 2022-10-12 NOTE — Discharge Instructions (Addendum)
I have prescribed an inhaler to take as needed, as well as a short burst of steroids to help with the congestion, please return to your normal dialysis schedule after finishing dialysis in the emergency department today.

## 2022-10-12 NOTE — ED Provider Notes (Signed)
[place] EMERGENCY DEPARTMENT AT [facility] Provider Note   CSN: [id number] Arrival date & time: 10/12/22  1311     History  Chief Complaint  Patient presents with   Cough    [name] is a 57 y.o. female with past medical history significant for paraplegia, chronic suprapubic catheter, ESRD on dialysis MWF who presents with concern for cough for few weeks.  Patient reports the cough is somewhat productive.  She reports that she did have some fevers at home but no fevers at this time.  She reports they had raised concern that she may have pneumonia with her home health nurses.  She denies any nausea, vomiting, chest pain.   Cough      Home Medications Prior to Admission medications   Medication Sig Start Date End Date Taking? Authorizing Provider  albuterol (VENTOLIN HFA) 108 (90 Base) MCG/ACT inhaler Inhale 1-2 puffs into the lungs every 6 (six) hours as needed for wheezing or shortness of breath. 10/12/22  Yes Christina Mcferran [name], [name]  predniSONE (DELTASONE) 20 MG tablet Take 2 tablets (40 mg total) by mouth daily. 10/12/22  Yes Christina Hill [name], [name]  acetaminophen (TYLENOL) 500 MG tablet Take 1,000 mg by mouth every 6 (six) hours as needed for moderate pain.    [provider]  amLODipine (NORVASC) 5 MG tablet Take 5 mg by mouth See admin instructions. Take 1 tablet on Monday,Wednesday and Friday then take 2 tablets on Tuesday,Thursday,Saturday and Sunday. 11/24/18   [provider]  B Complex-C-Folic Acid (RENA-VITE RX PO) Take 1 tablet by mouth daily.    [provider]  calcium acetate (PHOSLO) 667 MG capsule Take 1,334 mg by mouth 3 (three) times daily. Patient not taking: Reported on 07/23/2022 01/30/22   [provider]  carvedilol (COREG) 6.25 MG tablet Take 6.25 mg by mouth 2 (two) times daily with a meal.    [provider]  cloNIDine (CATAPRES) 0.1 MG tablet Take by mouth. 12/19/21   [provider]  cyclobenzaprine (FLEXERIL) 5 MG tablet Take 5 mg by mouth 3 (three) times daily as needed. 03/18/22   [provider]  docusate sodium (COLACE) 100 MG capsule Take 100 mg by mouth at bedtime.    [provider]  furosemide (LASIX) 20 MG tablet Take 20 mg by mouth daily. 08/14/21   [provider]  levETIRAcetam (KEPPRA) 500 MG tablet Take 1,000 mg by mouth daily. 08/09/21   [provider]  lidocaine (XYLOCAINE) 5 % ointment Apply topically. Patient not taking: Reported on 07/23/2022 05/10/22   [provider]  lidocaine-prilocaine (EMLA) cream Apply 1 Application topically 3 (three) times a week. 06/04/22   [provider]  Multiple Vitamin (MULTIVITAMIN) tablet Take 1 tablet by mouth daily.    [provider]  sevelamer carbonate (RENVELA) 800 MG tablet Take 800 mg by mouth 3 (three) times daily with meals.    [provider]      Allergies    Benadryl [diphenhydramine hcl (sleep)], Quinine derivatives, Vancomycin, Azithromycin, Tetracycline, and Zosyn [piperacillin sod-tazobactam so]    Review of Systems   Review of Systems  Respiratory:  Positive for cough.   All other systems reviewed and are negative.   Physical Exam Updated Vital Signs BP (!) 159/78   Pulse 74   Temp 98.1 F (36.7 C) (Oral)   Resp (!) 24   Ht 5\' 5"  (1.651 m)   Wt 59 kg   LMP  (LMP  Unknown)   SpO2 97%   BMI 21.63 kg/m  Physical Exam Vitals and nursing note reviewed.  Constitutional:      General: She is not in acute distress.    Appearance: Normal appearance.     Comments: Chronically ill-appearing but nontoxic, nonseptic appearing  HENT:     Head: Normocephalic and atraumatic.  Eyes:     General:        Right eye: No discharge.        Left eye: No discharge.  Cardiovascular:     Rate and Rhythm: Normal rate and regular rhythm.     Heart sounds: No murmur heard.    No friction rub. No gallop.  Pulmonary:      Effort: Pulmonary effort is normal.     Breath sounds: Normal breath sounds.     Comments: Patient with crackles, rhonchi throughout, she has mild wheezing on expiration. No acute respiratory distress noted on my exam Abdominal:     General: Bowel sounds are normal.     Palpations: Abdomen is soft.  Skin:    General: Skin is warm and dry.     Capillary Refill: Capillary refill takes less than 2 seconds.  Neurological:     Mental Status: She is alert and oriented to person, place, and time.  Psychiatric:        Mood and Affect: Mood normal.        Behavior: Behavior normal.     ED Results / Procedures / Treatments   Labs (all labs ordered are listed, but only abnormal results are displayed) Labs Reviewed  CBC - Abnormal; Notable for the following components:      Result Value   RBC 3.65 (*)    Hemoglobin 9.8 (*)    HCT 33.5 (*)    MCHC 29.3 (*)    RDW 16.4 (*)    All other components within normal limits  BASIC METABOLIC PANEL - Abnormal; Notable for the following components:   Sodium 130 (*)    Potassium 6.0 (*)    Chloride 96 (*)    Glucose, Bld 105 (*)    BUN 53 (*)    Creatinine, Ser 6.58 (*)    Calcium 8.8 (*)    GFR, Estimated 7 (*)    All other components within normal limits  HEPATITIS B SURFACE ANTIGEN  HEPATITIS B SURFACE ANTIBODY, QUANTITATIVE    EKG EKG Interpretation  Date/Time:  Saturday Oct 12 2022 13:30:09 EDT Ventricular Rate:  76 PR Interval:  137 QRS Duration: 112 QT Interval:  449 QTC Calculation: 505 R Axis:   -52 Text Interpretation: Sinus rhythm Incomplete RBBB and LAFB Abnormal T, consider ischemia, lateral leads new lateral cahnges from prior 2/24 Confirmed by Meridee Score [id number]) on 10/12/2022 1:33:41 PM  Radiology DG Chest Port 1 View  Result Date: 10/12/2022 CLINICAL DATA:  Several week history of cough EXAM: PORTABLE CHEST 1 VIEW COMPARISON:  Chest radiograph dated 07/23/2022 FINDINGS: Normal lung volumes. No focal  consolidations. Small volume fluid along the right lung fissures. No pneumothorax. Similar enlarged cardiomediastinal silhouette. Unchanged nonunited right clavicle. Thoracic spine fixation hardware appears intact. IMPRESSION: 1. No focal consolidations. 2. Small volume fluid along the right lung fissures. Electronically Signed   By: Agustin Cree M.D.   On: 10/12/2022 14:14    Procedures Procedures    Medications Ordered in ED Medications  albuterol (VENTOLIN HFA) 108 (90 Base) MCG/ACT inhaler 2 puff (2 puffs Inhalation Given 10/12/22 1457)  Chlorhexidine Gluconate Cloth  2 % PADS 6 each (has no administration in time range)  albuterol (PROVENTIL) (2.5 MG/3ML) 0.083% nebulizer solution 10 mg (10 mg Nebulization Given 10/12/22 1457)    ED Course/ Medical Decision Making/ A&P Clinical Course as of 10/12/22 1609  Sat Oct 12, 2022  1437 Spoke with Dr. Arlean Hopping with nephrology who who will consult with nephrology team and call back to give recommendations [CP]  1452 EDHD plan, discharge with prednisone, albuterol [CP]    Clinical Course User Index [CP] Olene Floss, [name]                             Medical Decision Making Amount and/or Complexity of Data Reviewed Radiology: ordered.  Risk Prescription drug management.   This patient is a 57 y.o. female  who presents to the ED for concern of cough, weakness.   Differential diagnoses prior to evaluation: The emergent differential diagnosis includes, but is not limited to,  asthma exacerbation, COPD exacerbation, acute upper respiratory infection, acute bronchitis, chronic bronchitis, interstitial lung disease, ARDS, PE, pneumonia, atypical ACS, carbon monoxide poisoning, spontaneous pneumothorax, new CHF vs CHF exacerbation, versus viral syndrome, post viral cough sundrome. This is not an exhaustive differential.   Past Medical History / Co-morbidities: paraplegia, chronic suprapubic catheter, ESRD on dialysis MWF  Additional  history: Chart reviewed. Pertinent results include: Reviewed lab work, imaging from previous emergency department visits  Physical Exam: Physical exam performed. The pertinent findings include: Patient is chronically ill-appearing but nontoxic, nonseptic appearing.  She has crackles, rhonchi throughout, mild expiratory wheeze.  She has no focal consolidation on my exam.  She is afebrile.  She is hypertensive with blood pressure 172/83.  She has fistula in left upper extremity with palpable thrill.  She has suprapubic catheter in place with good production of urine  Lab Tests/Imaging studies: I personally interpreted labs/imaging and the pertinent results include: CBC with anemia, hemoglobin 9.8, not significantly changed from her baseline.  Her BMP with mild hyponatremia, sodium 130, she does have some significant hyperkalemia, calcium 6.0.  BUN, creatinine elevated, she has ESRD on dialysis, not significantly abnormal compared to expected.  I dependently interpreted plain film chest x-ray which shows no evidence of acute intrathoracic abnormality.  I agree with the radiologist interpretation.  Cardiac monitoring: EKG obtained and interpreted by my attending physician which shows: Normal sinus rhythm, no evidence of peaked T waves   Medications: I ordered medication including albuterol for wheezing, cough, will plan to discharge on short course of prednisone for likely acute bronchitis of viral origin, she is afebrile with no evidence of pneumonia on exam..  I have reviewed the patients home medicines and have made adjustments as needed.  Consultations: I discussed care with nephrology, spoke with Dr. Arlean Hopping, and after discussion they recommended: ED hemodialysis with plan to discharge thereafter, patient will return to her normal Monday Wednesday Friday dialysis.   Disposition: After consideration of the diagnostic results and the patients response to treatment, I feel that patient with signs  and symptoms of an acute bronchitis secondary to a viral illness, however she also had hyperkalemia in the ED today after missed dialysis x 1, she will be dialyzed in the emergency department and discharged thereafter with plan as above for short course prednisone, albuterol, close PCP follow-up for her acute bronchitis.  She should return to her dialysis on Monday..   emergency department workup does not suggest an emergent condition requiring admission or  immediate intervention beyond what has been performed at this time. The plan is: as above. The patient is safe for discharge and has been instructed to return immediately for worsening symptoms, change in symptoms or any other concerns.  Final Clinical Impression(s) / ED Diagnoses Final diagnoses:  Acute bronchitis, unspecified organism  Hyperkalemia  Hyponatremia    Rx / DC Orders ED Discharge Orders          Ordered    albuterol (VENTOLIN HFA) 108 (90 Base) MCG/ACT inhaler  Every 6 hours PRN        10/12/22 1457    predniSONE (DELTASONE) 20 MG tablet  Daily        10/12/22 1457              [name], [name], [name] 10/12/22 1609    Terrilee Files, MD 10/12/22 [phone]

## 2022-10-12 NOTE — Progress Notes (Signed)
Asked about this patient who missed her HD yesterday due to not feeling well.  In ED per the ED MD pt is alert and not acutely ill but labs show K+ 6.0, no EKG changes. Her HD usually is [facility] MWF.  My plan is go ahead w/ albuterol 10mg  nebs, I will cancel the kayexalate and we will plan short HD today (3d) using low K+ bath for part of the session. After HD is completed, ED team will reassess and pt likely will be able to dc home.  Do not think that K+ needs to be repeated after dialysis. She will then resume her usual outpt on Monday.   Vinson Moselle, MD 10/12/2022, 2:49 PM  Recent Labs  Lab 10/12/22 1349  HGB 9.8*  CALCIUM 8.8*  CREATININE 6.58*  K 6.0*    Inpatient medications:  albuterol  10 mg Nebulization Once    albuterol

## 2022-10-12 NOTE — ED Notes (Signed)
Pt currently off unit in dialysis

## 2022-10-12 NOTE — ED Notes (Signed)
Transported to Dialysis

## 2022-10-12 NOTE — Progress Notes (Signed)
   HEMODIALYSIS TREATMENT NOTE:  3 hour heparin-free treatment completed using left upper arm AVF.  Low K+ bath utilized for first 1.25 hours of therapy.  Goal was adjusted in response to declining BP.  Net UF 2L.  All blood was returned.  Post-dialysis:  10/12/22 2045  Vitals  Temp 98 F (36.7 C)  Temp Source Oral  BP (!) 153/80  MAP (mmHg) 97  BP Location Right Arm  BP Method Automatic  Patient Position (if appropriate) Lying  Pulse Rate 78  Pulse Rate Source Monitor  ECG Heart Rate 75  Resp 20  Oxygen Therapy  SpO2 100 %  O2 Device Room Air  Hepatitis B Pre Treatment Patient Checks  Patient's Immunity Status Pending (labs drawn, awaiting results)  Isolation Initiated Unknown Hepatitis status (T1 chemically disinfected)  Post Treatment  Dialyzer Clearance Lightly streaked  Duration of HD Treatment -hour(s) 3 hour(s)  Hemodialysis Intake (mL) 0 mL  Liters Processed 51.4  Fluid Removed (mL) 2000 mL  Tolerated HD Treatment Yes  Post-Hemodialysis Comments 2L removed  AVG/AVF Arterial Site Held (minutes) 7 minutes  AVG/AVF Venous Site Held (minutes) 7 minutes  Fistula / Graft Left Upper arm Arteriovenous fistula  No placement date or time found.   Placed prior to admission: Yes  Orientation: Left  Access Location: Upper arm  Access Type: Arteriovenous fistula  Fistula / Graft Assessment Thrill;Bruit  Status Patent   Pt was transported from [facility] back to ED to await disposition.  Hand-off given to Christella Scheuermann, RN  Arman Filter, RN AP [facility]

## 2022-10-12 NOTE — ED Triage Notes (Signed)
Pt c/o cough for a few weeks. Pt states cough is productive at times. Pt states she did have fevers at home.

## 2022-10-12 NOTE — ED Notes (Signed)
Notified [place] [facility] of patient needing transportation back home.  

## 2022-10-15 ENCOUNTER — Telehealth: Payer: Self-pay | Admitting: *Deleted

## 2022-10-15 ENCOUNTER — Encounter: Payer: Self-pay | Admitting: Gastroenterology

## 2022-10-15 ENCOUNTER — Encounter: Payer: Self-pay | Admitting: *Deleted

## 2022-10-15 ENCOUNTER — Ambulatory Visit (INDEPENDENT_AMBULATORY_CARE_PROVIDER_SITE_OTHER): Payer: 59 | Admitting: Gastroenterology

## 2022-10-15 VITALS — BP 138/77 | HR 78 | Temp 98.2°F | Ht 62.0 in | Wt 135.0 lb

## 2022-10-15 DIAGNOSIS — R112 Nausea with vomiting, unspecified: Secondary | ICD-10-CM

## 2022-10-15 DIAGNOSIS — R634 Abnormal weight loss: Secondary | ICD-10-CM | POA: Diagnosis not present

## 2022-10-15 LAB — HEPATITIS B SURFACE ANTIBODY, QUANTITATIVE: Hep B S AB Quant (Post): 429 m[IU]/mL (ref >9.9)

## 2022-10-15 NOTE — Telephone Encounter (Signed)
[employer] PA: Notification or Prior Authorization is not required for the requested services You are not required to submit a notification/prior authorization based on the information provided. If you have general questions about the prior authorization requirements, visit [email or web address] > Clinician Resources > Advance and Admission Notification Requirements. The number above acknowledges your notification. Please write this reference number down for future reference. If you would like to request an organization determination, please call us at [phone]. Decision ID #: [id number]
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
## 2022-10-15 NOTE — Patient Instructions (Signed)
We will work on getting you scheduled for an upper endoscopy in near future.  Continue pantoprazole 40mg  daily before breakfast.

## 2022-10-16 ENCOUNTER — Encounter: Payer: Self-pay | Admitting: *Deleted

## 2022-10-26 ENCOUNTER — Encounter: Payer: Self-pay | Admitting: Gastroenterology

## 2022-10-26 DIAGNOSIS — R112 Nausea with vomiting, unspecified: Secondary | ICD-10-CM | POA: Insufficient documentation

## 2022-10-26 DIAGNOSIS — R634 Abnormal weight loss: Secondary | ICD-10-CM | POA: Insufficient documentation

## 2022-11-07 ENCOUNTER — Encounter (HOSPITAL_COMMUNITY): Payer: Self-pay | Admitting: Emergency Medicine

## 2022-11-07 ENCOUNTER — Other Ambulatory Visit: Payer: Self-pay

## 2022-11-07 ENCOUNTER — Emergency Department (HOSPITAL_COMMUNITY)
Admission: EM | Admit: 2022-11-07 | Discharge: 2022-11-08 | Disposition: A | Discharge status: Home / Self Care | Payer: 59 | Source: Home / Self Care | Attending: Emergency Medicine | Admitting: Emergency Medicine

## 2022-11-07 ENCOUNTER — Emergency Department (HOSPITAL_COMMUNITY): Payer: 59

## 2022-11-07 DIAGNOSIS — Z20822 Contact with and (suspected) exposure to covid-19: Secondary | ICD-10-CM | POA: Insufficient documentation

## 2022-11-07 DIAGNOSIS — J020 Streptococcal pharyngitis: Secondary | ICD-10-CM | POA: Insufficient documentation

## 2022-11-07 DIAGNOSIS — Z992 Dependence on renal dialysis: Secondary | ICD-10-CM | POA: Insufficient documentation

## 2022-11-07 DIAGNOSIS — R7881 Bacteremia: Secondary | ICD-10-CM | POA: Diagnosis not present

## 2022-11-07 DIAGNOSIS — N186 End stage renal disease: Secondary | ICD-10-CM | POA: Insufficient documentation

## 2022-11-07 DIAGNOSIS — J189 Pneumonia, unspecified organism: Secondary | ICD-10-CM | POA: Insufficient documentation

## 2022-11-07 LAB — CBC WITH DIFFERENTIAL/PLATELET
Abs Immature Granulocytes: 0.15 10*3/uL — ABNORMAL HIGH (ref 0.00–0.07)
Basophils Absolute: 0 10*3/uL (ref 0.0–0.1)
Basophils Relative: 0 %
Eosinophils Absolute: 0 10*3/uL (ref 0.0–0.5)
Eosinophils Relative: 0 %
HCT: 31.6 % — ABNORMAL LOW (ref 36.0–46.0)
Hemoglobin: 9 g/dL — ABNORMAL LOW (ref 12.0–15.0)
Immature Granulocytes: 1 %
Lymphocytes Relative: 4 %
Lymphs Abs: 0.8 10*3/uL (ref 0.7–4.0)
MCH: 26.4 pg (ref 26.0–34.0)
MCHC: 28.5 g/dL — ABNORMAL LOW (ref 30.0–36.0)
MCV: 92.7 fL (ref 80.0–100.0)
Monocytes Absolute: 0.4 10*3/uL (ref 0.1–1.0)
Monocytes Relative: 2 %
Neutro Abs: 19.5 10*3/uL — ABNORMAL HIGH (ref 1.7–7.7)
Neutrophils Relative %: 93 %
Platelets: 411 10*3/uL — ABNORMAL HIGH (ref 150–400)
RBC: 3.41 MIL/uL — ABNORMAL LOW (ref 3.87–5.11)
RDW: 19.1 % — ABNORMAL HIGH (ref 11.5–15.5)
WBC: 20.8 10*3/uL — ABNORMAL HIGH (ref 4.0–10.5)
nRBC: 0 % (ref 0.0–0.2)

## 2022-11-07 LAB — URINALYSIS, ROUTINE W REFLEX MICROSCOPIC
Bilirubin Urine: NEGATIVE
Glucose, UA: NEGATIVE mg/dL
Ketones, ur: NEGATIVE mg/dL
Nitrite: NEGATIVE
Protein, ur: 100 mg/dL — AB
Specific Gravity, Urine: 1.006 (ref 1.005–1.030)
pH: 8 (ref 5.0–8.0)

## 2022-11-07 LAB — CULTURE, BLOOD (ROUTINE X 2)

## 2022-11-07 LAB — BASIC METABOLIC PANEL
Anion gap: 12 (ref 5–15)
BUN: 32 mg/dL — ABNORMAL HIGH (ref 6–20)
CO2: 26 mmol/L (ref 22–32)
Calcium: 8.3 mg/dL — ABNORMAL LOW (ref 8.9–10.3)
Chloride: 95 mmol/L — ABNORMAL LOW (ref 98–111)
Creatinine, Ser: 4.01 mg/dL — ABNORMAL HIGH (ref 0.44–1.00)
GFR, Estimated: 13 mL/min — ABNORMAL LOW (ref >60)
Glucose, Bld: 105 mg/dL — ABNORMAL HIGH (ref 70–99)
Potassium: 3.9 mmol/L (ref 3.5–5.1)
Sodium: 133 mmol/L — ABNORMAL LOW (ref 135–145)

## 2022-11-07 LAB — GROUP A STREP BY PCR: Group A Strep by PCR: DETECTED — AB

## 2022-11-07 LAB — RESP PANEL BY RT-PCR (RSV, FLU A&B, COVID)  RVPGX2
Influenza A by PCR: NEGATIVE
Influenza B by PCR: NEGATIVE
Resp Syncytial Virus by PCR: NEGATIVE
SARS Coronavirus 2 by RT PCR: NEGATIVE

## 2022-11-07 LAB — LACTIC ACID, PLASMA: Lactic Acid, Venous: 1.1 mmol/L (ref 0.5–1.9)

## 2022-11-07 MED ORDER — SODIUM CHLORIDE 0.9 % IV SOLN
1.0000 g | Freq: Once | INTRAVENOUS | Status: AC
  Administered 2022-11-07: 1 g via INTRAVENOUS
  Filled 2022-11-07: qty 10

## 2022-11-07 MED ORDER — SODIUM CHLORIDE 0.9 % IV BOLUS
500.0000 mL | Freq: Once | INTRAVENOUS | Status: AC
  Administered 2022-11-07: 500 mL via INTRAVENOUS

## 2022-11-07 MED ORDER — CEFUROXIME AXETIL 250 MG PO TABS: 250.0000 mg | ORAL_TABLET | Freq: Two times a day (BID) | ORAL | 0 refills | Status: DC

## 2022-11-07 MED ORDER — OXYCODONE-ACETAMINOPHEN 5-325 MG PO TABS
1.0000 | ORAL_TABLET | Freq: Once | ORAL | Status: AC
  Administered 2022-11-07: 1 via ORAL
  Filled 2022-11-07: qty 1

## 2022-11-07 NOTE — ED Notes (Signed)
Pt provided with sandwich bag meal & diet ginger ale.

## 2022-11-07 NOTE — Discharge Instructions (Signed)
Seen in the emergency department for cough and sore throat.  You tested positive for strep throat.  Your chest x-ray also showed a possible pneumonia.  You were given IV fluids and IV antibiotics.  We are sending a prescription to your pharmacy for oral antibiotics.  Please follow-up with your primary care doctor for reevaluation.  Return to the emergency department if any worsening or concerning symptoms.

## 2022-11-07 NOTE — ED Provider Notes (Signed)
EMERGENCY DEPARTMENT AT [facility] Provider Note   CSN: [id number] Arrival date & time: 11/07/22  1716     History  Chief Complaint  Patient presents with   Sore Throat    [name] is a 57 y.o. female.  She has a history of end-stage renal disease and gets dialysis Monday Wednesday Friday.  She is complaining of cough that is been going on for few months although fever and sore throat for the last few days.  No nausea or vomiting.  She said she is very stubborn and has not wanted to go to the doctor.  He has some pressure ulcers on her sacral area that is followed by wound clinic  The history is provided by the patient.  Sore Throat This is a new problem. The current episode started yesterday. The problem occurs constantly. The problem has not changed since onset.Associated symptoms include shortness of breath. Pertinent negatives include no chest pain, no abdominal pain and no headaches. The symptoms are aggravated by swallowing. Nothing relieves the symptoms. She has tried nothing for the symptoms. The treatment provided no relief.       Home Medications Prior to Admission medications   Medication Sig Start Date End Date Taking? Authorizing Provider  acetaminophen (TYLENOL) 500 MG tablet Take 1,000 mg by mouth every 6 (six) hours as needed for moderate pain.    [provider]  albuterol (VENTOLIN HFA) 108 (90 Base) MCG/ACT inhaler Inhale 1-2 puffs into the lungs every 6 (six) hours as needed for wheezing or shortness of breath. 10/12/22   Prosperi, Christian H, PA-C  amLODipine (NORVASC) 5 MG tablet Take 5 mg by mouth See admin instructions. Take 1 tablet on Monday,Wednesday and Friday then take 2 tablets on Tuesday,Thursday,Saturday and Sunday. 11/24/18   [provider]  B Complex-C-Folic Acid (RENA-VITE RX PO) Take 1 tablet by mouth daily.    [provider]  buprenorphine (BUTRANS) 7.5 MCG/HR Place onto the skin. 10/03/22    [provider]  carvedilol (COREG) 6.25 MG tablet Take 6.25 mg by mouth 2 (two) times daily with a meal.    [provider]  cloNIDine (CATAPRES) 0.1 MG tablet Take by mouth. 12/19/21   [provider]  docusate sodium (COLACE) 100 MG capsule Take 100 mg by mouth at bedtime.    [provider]  furosemide (LASIX) 20 MG tablet Take 20 mg by mouth daily. 08/14/21   [provider]  irbesartan (AVAPRO) 150 MG tablet SMARTSIG:1 Tablet(s) By Mouth Every Evening    [provider]  levETIRAcetam (KEPPRA) 500 MG tablet Take 1,000 mg by mouth daily. 08/09/21   [provider]  lidocaine-prilocaine (EMLA) cream Apply 1 Application topically 3 (three) times a week. 06/04/22   [provider]  Multiple Vitamin (MULTIVITAMIN) tablet Take 1 tablet by mouth daily.    [provider]  pantoprazole (PROTONIX) 40 MG tablet Take 40 mg by mouth daily. 08/03/22   [provider]  sevelamer carbonate (RENVELA) 800 MG tablet Take 800 mg by mouth 3 (three) times daily with meals.    [provider]      Allergies    Benadryl [diphenhydramine hcl (sleep)], Quinine derivatives, Vancomycin, Azithromycin, Tetracycline, and Zosyn [piperacillin sod-tazobactam so]    Review of Systems   Review of Systems  Constitutional:  Positive for fever.  HENT:  Positive for sore throat.   Respiratory:  Positive for cough and shortness of breath.   Cardiovascular:  Negative for chest pain.  Gastrointestinal:  Negative for abdominal pain, nausea and vomiting.  Neurological:  Negative for headaches.    Physical Exam Updated Vital Signs BP (!) 200/110   Pulse (!) 124   Temp 98.8 F (37.1 C)   Resp 18   Ht 5\' 5"  (1.651 m)   Wt 59 kg   LMP  (LMP Unknown)   SpO2 96%   BMI 21.63 kg/m  Physical Exam Vitals and nursing note reviewed.  Constitutional:      General: She is not in acute distress.    Appearance: She is well-developed.   HENT:     Head: Normocephalic and atraumatic.     Mouth/Throat:     Pharynx: Posterior oropharyngeal erythema present. No oropharyngeal exudate.  Eyes:     Conjunctiva/sclera: Conjunctivae normal.  Cardiovascular:     Rate and Rhythm: Regular rhythm. Tachycardia present.     Heart sounds: No murmur heard. Pulmonary:     Effort: Pulmonary effort is normal. No respiratory distress.     Breath sounds: Rhonchi (few scattered) present.  Abdominal:     Palpations: Abdomen is soft.     Tenderness: There is no abdominal tenderness.  Musculoskeletal:        General: No swelling.     Cervical back: Neck supple.  Skin:    General: Skin is warm and dry.     Capillary Refill: Capillary refill takes less than 2 seconds.  Neurological:     Mental Status: She is alert.     ED Results / Procedures / Treatments   Labs (all labs ordered are listed, but only abnormal results are displayed) Labs Reviewed  GROUP A STREP BY PCR - Abnormal; Notable for the following components:      Result Value   Group A Strep by PCR DETECTED (*)    All other components within normal limits  URINALYSIS, ROUTINE W REFLEX MICROSCOPIC - Abnormal; Notable for the following components:   APPearance TURBID (*)    Hgb urine dipstick SMALL (*)    Protein, ur 100 (*)    Leukocytes,Ua MODERATE (*)    Bacteria, UA MANY (*)    All other components within normal limits  BASIC METABOLIC PANEL - Abnormal; Notable for the following components:   Sodium 133 (*)    Chloride 95 (*)    Glucose, Bld 105 (*)    BUN 32 (*)    Creatinine, Ser 4.01 (*)    Calcium 8.3 (*)    GFR, Estimated 13 (*)    All other components within normal limits  CBC WITH DIFFERENTIAL/PLATELET - Abnormal; Notable for the following components:   WBC 20.8 (*)    RBC 3.41 (*)    Hemoglobin 9.0 (*)    HCT 31.6 (*)    MCHC 28.5 (*)    RDW 19.1 (*)    Platelets 411 (*)    Neutro Abs 19.5 (*)    Abs Immature Granulocytes 0.15 (*)    All other  components within normal limits  RESP PANEL BY RT-PCR (RSV, FLU A&B, COVID)  RVPGX2  CULTURE, BLOOD (ROUTINE X 2)  CULTURE, BLOOD (ROUTINE X 2)  URINE CULTURE  LACTIC ACID, PLASMA    EKG EKG Interpretation  Date/Time:  Thursday November 07 2022 19:53:21 EDT Ventricular Rate:  112 PR Interval:  157 QRS Duration: 101 QT Interval:  352 QTC Calculation: 481 R Axis:   -69 Text Interpretation: Sinus tachycardia Probable left atrial enlargement Incomplete RBBB and LAFB  Consider right ventricular hypertrophy Artifact in lead(s) I II aVR V3 Confirmed by Susy Frizzle [phone]) on 11/08/2022 8:17:13 AM  Radiology DG Chest 2 View  Result Date: 11/07/2022 CLINICAL DATA:  Cough EXAM: CHEST - 2 VIEW COMPARISON:  Previous studies including the examination of 10/12/2022 FINDINGS: Transverse diameter of heart is increased. Apparent shift of mediastinum to the left may be due to rotation. Increased markings are seen in medial left lower lung field. There is blunting of both lateral CP angles. There is no pneumothorax. There is surgical fusion in thoracic spine. There is deformity in the right clavicle from previous injury. Medial end of right clavicle is not seen. IMPRESSION: Increased markings are seen in medial left lower lung fields suggesting atelectasis/pneumonia. There is blunting of both lateral CP angles suggesting small bilateral pleural effusions. Electronically Signed   By: Ernie Avena M.D.   On: 11/07/2022 18:50    Procedures Procedures    Medications Ordered in ED Medications  sodium chloride 0.9 % bolus 500 mL (0 mLs Intravenous Stopped 11/07/22 2134)  cefTRIAXone (ROCEPHIN) 1 g in sodium chloride 0.9 % 100 mL IVPB (0 g Intravenous Stopped 11/07/22 2109)  oxyCODONE-acetaminophen (PERCOCET/ROXICET) 5-325 MG per tablet 1 tablet (1 tablet Oral Given 11/07/22 2200)    ED Course/ Medical Decision Making/ A&P Clinical Course as of 11/07/22 2010  Thu Nov 07, 2022  2009 Chest x-ray  interpreted by me this cardiomegaly no definite filtrate.  Radiology is reading possible infiltrate left base and small effusions bilaterally [MB]    Clinical Course User Index [MB] Terrilee Files, MD                             Medical Decision Making Amount and/or Complexity of Data Reviewed Labs: ordered. Radiology: ordered.  Risk Prescription drug management.   This patient complains of sore throat cough fever; this involves an extensive number of treatment Options and is a complaint that carries with it a high risk of complications and morbidity. The differential includes strep, viral, pneumonia, bronchitis, CHF, sepsis  I ordered, reviewed and interpreted labs, which included CBC with elevated white count stable low hemoglobin, chemistries consistent with end-stage renal disease, lactate normal, urinalysis possible signs of infection sent for culture, blood culture sent, COVID-negative, strep positive I ordered medication IV antibiotics IV fluids oral pain medication and reviewed PMP when indicated. I ordered imaging studies which included chest x-ray and I independently    visualized and interpreted imaging which showed possible pneumonia Previous records obtained and reviewed in epic including recent ED and PCP notes Cardiac monitoring reviewed, sinus tachycardia Social determinants considered, no significant barriers Critical Interventions: None  After the interventions stated above, I reevaluated the patient and found patient to be oxygenating well on room air.  No increased work of breathing.  She does have continued tachycardia. Admission and further testing considered, she will need close follow-up with PCP but does not require admission at this time.  Will cover her with antibiotics, she does have multiple allergies, will put her on second-generation cephalosporin.  Return instructions discussed.         Final Clinical Impression(s) / ED Diagnoses Final  diagnoses:  Strep pharyngitis  Community acquired pneumonia of left lung, unspecified part of lung    Rx / DC Orders ED Discharge Orders          Ordered    cefUROXime (CEFTIN) 250 MG tablet  2 times daily  with meals        11/07/22 2234              Terrilee Files, MD 11/08/22 [phone]

## 2022-11-08 ENCOUNTER — Inpatient Hospital Stay (HOSPITAL_COMMUNITY)
Admission: EM | Admit: 2022-11-08 | Discharge: 2022-11-12 | DRG: 871 | Disposition: A | Discharge status: Home Health | Payer: 59 | Attending: Family Medicine | Admitting: Family Medicine

## 2022-11-08 ENCOUNTER — Other Ambulatory Visit: Payer: Self-pay

## 2022-11-08 ENCOUNTER — Encounter (HOSPITAL_COMMUNITY): Payer: Self-pay | Admitting: Emergency Medicine

## 2022-11-08 ENCOUNTER — Emergency Department (HOSPITAL_COMMUNITY): Payer: 59

## 2022-11-08 DIAGNOSIS — Z9359 Other cystostomy status: Secondary | ICD-10-CM | POA: Diagnosis not present

## 2022-11-08 DIAGNOSIS — E871 Hypo-osmolality and hyponatremia: Secondary | ICD-10-CM | POA: Diagnosis present

## 2022-11-08 DIAGNOSIS — K219 Gastro-esophageal reflux disease without esophagitis: Secondary | ICD-10-CM | POA: Diagnosis present

## 2022-11-08 DIAGNOSIS — L89152 Pressure ulcer of sacral region, stage 2: Secondary | ICD-10-CM | POA: Diagnosis present

## 2022-11-08 DIAGNOSIS — Z992 Dependence on renal dialysis: Secondary | ICD-10-CM | POA: Diagnosis not present

## 2022-11-08 DIAGNOSIS — I120 Hypertensive chronic kidney disease with stage 5 chronic kidney disease or end stage renal disease: Secondary | ICD-10-CM | POA: Diagnosis present

## 2022-11-08 DIAGNOSIS — N186 End stage renal disease: Secondary | ICD-10-CM | POA: Diagnosis present

## 2022-11-08 DIAGNOSIS — D631 Anemia in chronic kidney disease: Secondary | ICD-10-CM | POA: Diagnosis present

## 2022-11-08 DIAGNOSIS — G40909 Epilepsy, unspecified, not intractable, without status epilepticus: Secondary | ICD-10-CM | POA: Diagnosis present

## 2022-11-08 DIAGNOSIS — G8220 Paraplegia, unspecified: Secondary | ICD-10-CM | POA: Diagnosis present

## 2022-11-08 DIAGNOSIS — I10 Essential (primary) hypertension: Secondary | ICD-10-CM | POA: Diagnosis not present

## 2022-11-08 DIAGNOSIS — L89159 Pressure ulcer of sacral region, unspecified stage: Secondary | ICD-10-CM | POA: Diagnosis present

## 2022-11-08 DIAGNOSIS — J189 Pneumonia, unspecified organism: Secondary | ICD-10-CM | POA: Diagnosis not present

## 2022-11-08 DIAGNOSIS — J020 Streptococcal pharyngitis: Secondary | ICD-10-CM | POA: Diagnosis present

## 2022-11-08 DIAGNOSIS — Z79899 Other long term (current) drug therapy: Secondary | ICD-10-CM | POA: Diagnosis not present

## 2022-11-08 DIAGNOSIS — T83511A Infection and inflammatory reaction due to indwelling urethral catheter, initial encounter: Secondary | ICD-10-CM

## 2022-11-08 DIAGNOSIS — B952 Enterococcus as the cause of diseases classified elsewhere: Secondary | ICD-10-CM | POA: Diagnosis present

## 2022-11-08 DIAGNOSIS — Z993 Dependence on wheelchair: Secondary | ICD-10-CM | POA: Diagnosis not present

## 2022-11-08 DIAGNOSIS — Z8542 Personal history of malignant neoplasm of other parts of uterus: Secondary | ICD-10-CM | POA: Diagnosis not present

## 2022-11-08 DIAGNOSIS — L8990 Pressure ulcer of unspecified site, unspecified stage: Secondary | ICD-10-CM | POA: Diagnosis present

## 2022-11-08 DIAGNOSIS — R7881 Bacteremia: Secondary | ICD-10-CM | POA: Diagnosis present

## 2022-11-08 DIAGNOSIS — F419 Anxiety disorder, unspecified: Secondary | ICD-10-CM | POA: Diagnosis not present

## 2022-11-08 DIAGNOSIS — Z1152 Encounter for screening for COVID-19: Secondary | ICD-10-CM

## 2022-11-08 DIAGNOSIS — Z881 Allergy status to other antibiotic agents status: Secondary | ICD-10-CM

## 2022-11-08 DIAGNOSIS — B9689 Other specified bacterial agents as the cause of diseases classified elsewhere: Secondary | ICD-10-CM | POA: Diagnosis present

## 2022-11-08 DIAGNOSIS — N390 Urinary tract infection, site not specified: Secondary | ICD-10-CM | POA: Diagnosis present

## 2022-11-08 DIAGNOSIS — I3139 Other pericardial effusion (noninflammatory): Secondary | ICD-10-CM | POA: Diagnosis not present

## 2022-11-08 DIAGNOSIS — Z8249 Family history of ischemic heart disease and other diseases of the circulatory system: Secondary | ICD-10-CM | POA: Diagnosis not present

## 2022-11-08 DIAGNOSIS — M898X9 Other specified disorders of bone, unspecified site: Secondary | ICD-10-CM | POA: Diagnosis present

## 2022-11-08 DIAGNOSIS — Z888 Allergy status to other drugs, medicaments and biological substances status: Secondary | ICD-10-CM

## 2022-11-08 LAB — CBC WITH DIFFERENTIAL/PLATELET
Abs Immature Granulocytes: 0.12 10*3/uL — ABNORMAL HIGH (ref 0.00–0.07)
Basophils Absolute: 0 10*3/uL (ref 0.0–0.1)
Basophils Relative: 0 %
Eosinophils Absolute: 0.2 10*3/uL (ref 0.0–0.5)
Eosinophils Relative: 1 %
HCT: 27.3 % — ABNORMAL LOW (ref 36.0–46.0)
Hemoglobin: 7.8 g/dL — ABNORMAL LOW (ref 12.0–15.0)
Immature Granulocytes: 1 %
Lymphocytes Relative: 10 %
Lymphs Abs: 1.9 10*3/uL (ref 0.7–4.0)
MCH: 26.4 pg (ref 26.0–34.0)
MCHC: 28.6 g/dL — ABNORMAL LOW (ref 30.0–36.0)
MCV: 92.2 fL (ref 80.0–100.0)
Monocytes Absolute: 0.5 10*3/uL (ref 0.1–1.0)
Monocytes Relative: 3 %
Neutro Abs: 15.5 10*3/uL — ABNORMAL HIGH (ref 1.7–7.7)
Neutrophils Relative %: 85 %
Platelets: 354 10*3/uL (ref 150–400)
RBC: 2.96 MIL/uL — ABNORMAL LOW (ref 3.87–5.11)
RDW: 18.8 % — ABNORMAL HIGH (ref 11.5–15.5)
WBC: 18.2 10*3/uL — ABNORMAL HIGH (ref 4.0–10.5)
nRBC: 0 % (ref 0.0–0.2)

## 2022-11-08 LAB — COMPREHENSIVE METABOLIC PANEL
ALT: 10 U/L (ref 0–44)
AST: 11 U/L — ABNORMAL LOW (ref 15–41)
Albumin: 2.2 g/dL — ABNORMAL LOW (ref 3.5–5.0)
Alkaline Phosphatase: 88 U/L (ref 38–126)
Anion gap: 9 (ref 5–15)
BUN: 15 mg/dL (ref 6–20)
CO2: 31 mmol/L (ref 22–32)
Calcium: 8.3 mg/dL — ABNORMAL LOW (ref 8.9–10.3)
Chloride: 94 mmol/L — ABNORMAL LOW (ref 98–111)
Creatinine, Ser: 1.95 mg/dL — ABNORMAL HIGH (ref 0.44–1.00)
GFR, Estimated: 30 mL/min — ABNORMAL LOW (ref >60)
Glucose, Bld: 93 mg/dL (ref 70–99)
Potassium: 3.1 mmol/L — ABNORMAL LOW (ref 3.5–5.1)
Sodium: 134 mmol/L — ABNORMAL LOW (ref 135–145)
Total Bilirubin: 0.6 mg/dL (ref 0.3–1.2)
Total Protein: 7.1 g/dL (ref 6.5–8.1)

## 2022-11-08 LAB — CULTURE, BLOOD (ROUTINE X 2)

## 2022-11-08 LAB — URINALYSIS, ROUTINE W REFLEX MICROSCOPIC
Bilirubin Urine: NEGATIVE
Glucose, UA: 150 mg/dL — AB
Ketones, ur: NEGATIVE mg/dL
Nitrite: NEGATIVE
Protein, ur: >=300 mg/dL — AB
Renal Epithelial: 2
Specific Gravity, Urine: 1.01 (ref 1.005–1.030)
pH: 8 (ref 5.0–8.0)

## 2022-11-08 LAB — LACTIC ACID, PLASMA
Lactic Acid, Venous: 0.9 mmol/L (ref 0.5–1.9)
Lactic Acid, Venous: 0.7 mmol/L (ref 0.5–1.9)

## 2022-11-08 LAB — URINE CULTURE

## 2022-11-08 MED ORDER — SODIUM CHLORIDE 0.9 % IV SOLN: 1.0000 g | INTRAVENOUS | Status: DC

## 2022-11-08 MED ORDER — OXYBUTYNIN CHLORIDE ER 5 MG PO TB24
5.0000 mg | ORAL_TABLET | Freq: Every day | ORAL | Status: DC
  Administered 2022-11-09 – 2022-11-12 (×4): 5 mg via ORAL
  Filled 2022-11-08 (×4): qty 1

## 2022-11-08 MED ORDER — PANTOPRAZOLE SODIUM 40 MG PO TBEC
40.0000 mg | DELAYED_RELEASE_TABLET | Freq: Every day | ORAL | Status: DC
  Administered 2022-11-08 – 2022-11-12 (×5): 40 mg via ORAL
  Filled 2022-11-08 (×5): qty 1

## 2022-11-08 MED ORDER — ENOXAPARIN SODIUM 30 MG/0.3ML IJ SOSY
30.0000 mg | PREFILLED_SYRINGE | INTRAMUSCULAR | Status: DC
  Administered 2022-11-08 – 2022-11-11 (×4): 30 mg via SUBCUTANEOUS
  Filled 2022-11-08 (×4): qty 0.3

## 2022-11-08 MED ORDER — BUPRENORPHINE 7.5 MCG/HR TD PTWK: 1.0000 | MEDICATED_PATCH | TRANSDERMAL | Status: DC

## 2022-11-08 MED ORDER — ALBUTEROL SULFATE (2.5 MG/3ML) 0.083% IN NEBU: 2.5000 mg | INHALATION_SOLUTION | Freq: Four times a day (QID) | RESPIRATORY_TRACT | Status: DC | PRN

## 2022-11-08 MED ORDER — VANCOMYCIN HCL 1250 MG/250ML IV SOLN
1250.0000 mg | Freq: Once | INTRAVENOUS | Status: AC
  Administered 2022-11-08: 1250 mg via INTRAVENOUS
  Filled 2022-11-08: qty 250

## 2022-11-08 MED ORDER — AMLODIPINE BESYLATE 5 MG PO TABS
10.0000 mg | ORAL_TABLET | ORAL | Status: DC
  Administered 2022-11-09 – 2022-11-12 (×3): 10 mg via ORAL
  Filled 2022-11-08 (×3): qty 2

## 2022-11-08 MED ORDER — SODIUM CHLORIDE 0.9 % IV SOLN
1.0000 g | Freq: Once | INTRAVENOUS | Status: AC
  Administered 2022-11-08: 1 g via INTRAVENOUS
  Filled 2022-11-08: qty 10

## 2022-11-08 MED ORDER — GUAIFENESIN-DM 100-10 MG/5ML PO SYRP
5.0000 mL | ORAL_SOLUTION | ORAL | Status: DC | PRN
  Administered 2022-11-08 – 2022-11-12 (×7): 5 mL via ORAL
  Filled 2022-11-08 (×7): qty 5

## 2022-11-08 MED ORDER — FUROSEMIDE 20 MG PO TABS
20.0000 mg | ORAL_TABLET | Freq: Every day | ORAL | Status: DC
  Administered 2022-11-08 – 2022-11-12 (×5): 20 mg via ORAL
  Filled 2022-11-08 (×5): qty 1

## 2022-11-08 MED ORDER — AMLODIPINE BESYLATE 5 MG PO TABS: 5.0000 mg | ORAL_TABLET | ORAL | Status: DC

## 2022-11-08 MED ORDER — ONDANSETRON HCL 4 MG PO TABS: 4.0000 mg | ORAL_TABLET | Freq: Four times a day (QID) | ORAL | Status: DC | PRN

## 2022-11-08 MED ORDER — IRBESARTAN 150 MG PO TABS
150.0000 mg | ORAL_TABLET | Freq: Every evening | ORAL | Status: DC
  Administered 2022-11-08 – 2022-11-11 (×4): 150 mg via ORAL
  Filled 2022-11-08 (×4): qty 1

## 2022-11-08 MED ORDER — VANCOMYCIN HCL 500 MG/100ML IV SOLN
500.0000 mg | INTRAVENOUS | Status: DC
  Administered 2022-11-11: 500 mg via INTRAVENOUS
  Filled 2022-11-08 (×3): qty 100

## 2022-11-08 MED ORDER — ONDANSETRON HCL 4 MG/2ML IJ SOLN: 4.0000 mg | Freq: Four times a day (QID) | INTRAMUSCULAR | Status: DC | PRN

## 2022-11-08 MED ORDER — AMLODIPINE BESYLATE 5 MG PO TABS
5.0000 mg | ORAL_TABLET | ORAL | Status: DC
  Administered 2022-11-08 – 2022-11-11 (×2): 5 mg via ORAL
  Filled 2022-11-08 (×3): qty 1

## 2022-11-08 MED ORDER — LEVETIRACETAM 500 MG PO TABS
1000.0000 mg | ORAL_TABLET | Freq: Every day | ORAL | Status: DC
  Administered 2022-11-09 – 2022-11-12 (×4): 1000 mg via ORAL
  Filled 2022-11-08 (×4): qty 2

## 2022-11-08 MED ORDER — DOCUSATE SODIUM 100 MG PO CAPS
100.0000 mg | ORAL_CAPSULE | Freq: Every day | ORAL | Status: DC
  Administered 2022-11-08: 100 mg via ORAL
  Filled 2022-11-08: qty 1

## 2022-11-08 MED ORDER — CARVEDILOL 3.125 MG PO TABS
6.2500 mg | ORAL_TABLET | Freq: Two times a day (BID) | ORAL | Status: DC
  Administered 2022-11-08 – 2022-11-11 (×6): 6.25 mg via ORAL
  Filled 2022-11-08 (×6): qty 2

## 2022-11-08 NOTE — H&P (Signed)
History and Physical    Patient: [name] ZOX:[id number] DOB: April 08, 1966 DOA: 11/08/2022 DOS: the patient was seen and examined on 11/08/2022 PCP: Richarda Osmond, FNP  Patient coming from: Home  Chief Complaint:  Chief Complaint  Patient presents with   Abnormal Lab   HPI: Franchesca Vint is a 57 y.o. female with medical history significant of end-stage renal disease on dialysis Monday, Wednesday, Friday, seizure disorder, paraplegia with suprapubic catheter, history of uterine cancer.  Patient was evaluated at the emergency department yesterday for cough and fever.  She was prescribed antibiotics for a community-acquired pneumonia and discharged home.  She had fevers, chills at the time but the decision was made that the patient was safe for outpatient management.  She was contacted today to return due to bacteremia as her blood cultures were growing bacteria.  She reports that her fevers have resolved.  She feels somewhat better, although continues to have a lot of coughing.  Review of Systems: As mentioned in the history of present illness. All other systems reviewed and are negative. Past Medical History:  Diagnosis Date   Abnormal uterine bleeding (AUB) 06/15/2014   Cancer (HCC)    uterine   High blood pressure    Paraplegia (lower)    Seizure disorder (HCC)    Seizures (HCC)    Suprapubic catheter (HCC)    Urinary tract infection    Past Surgical History:  Procedure Laterality Date   APPLICATION OF WOUND VAC Right 09/13/2021   (approximately 1-34mos ago) pressure sore on right hip   BACK SURGERY     Pt stated "before 2000"   ESOPHAGOGASTRODUODENOSCOPY N/A 09/20/2015   Procedure: ESOPHAGOGASTRODUODENOSCOPY (EGD);  Surgeon: Malissa Hippo, MD;  Location: [facility];  Service: Endoscopy;  Laterality: N/A;  730   IR CATHETER TUBE CHANGE  04/02/2018   PERCUTANEOUS ENDOSCOPIC GASTROSTOMY (PEG) REMOVAL N/A 09/20/2015   Procedure: PERCUTANEOUS ENDOSCOPIC  GASTROSTOMY (PEG) REMOVAL;  Surgeon: Malissa Hippo, MD;  Location: [facility];  Service: Endoscopy;  Laterality: N/A;   Social History:  reports that she has never smoked. She has never used smokeless tobacco. She reports current alcohol use. She reports that she does not use drugs.  Allergies  Allergen Reactions   Benadryl [Diphenhydramine Hcl (Sleep)] Hives   Quinine Derivatives Other (See Comments)    Alters mental status   Vancomycin     Pt is tolerating this medication at HD   Azithromycin Itching and Rash   Tetracycline Itching    Able to tolerate Doxycycline.    Zosyn [Piperacillin Sod-Tazobactam So] Rash    Family History  Problem Relation Age of Onset   Cancer Mother    Hypertension Mother    Cancer Sister        breast and then spread everywhere.   Diabetes Paternal Grandmother    Hypertension Paternal Grandmother     Prior to Admission medications   Medication Sig Start Date End Date Taking? Authorizing Provider  acetaminophen (TYLENOL) 500 MG tablet Take 1,000 mg by mouth every 6 (six) hours as needed for moderate pain.    [provider]  albuterol (VENTOLIN HFA) 108 (90 Base) MCG/ACT inhaler Inhale 1-2 puffs into the lungs every 6 (six) hours as needed for wheezing or shortness of breath. 10/12/22   Prosperi, Christian H, PA-C  amLODipine (NORVASC) 5 MG tablet Take 5 mg by mouth See admin instructions. Take 1 tablet on Monday,Wednesday and Friday then take 2 tablets on Tuesday,Thursday,Saturday and Sunday. 11/24/18  [provider]  B Complex-C-Folic Acid (RENA-VITE RX PO) Take 1 tablet by mouth daily.    [provider]  buprenorphine (BUTRANS) 7.5 MCG/HR Place onto the skin. 10/03/22   [provider]  carvedilol (COREG) 6.25 MG tablet Take 6.25 mg by mouth 2 (two) times daily with a meal.    [provider]  cefUROXime (CEFTIN) 250 MG tablet Take 1 tablet (250 mg total) by mouth 2 (two) times daily with a meal. 11/07/22    Terrilee Files, MD  cloNIDine (CATAPRES) 0.1 MG tablet Take by mouth. 12/19/21   [provider]  docusate sodium (COLACE) 100 MG capsule Take 100 mg by mouth at bedtime.    [provider]  furosemide (LASIX) 20 MG tablet Take 20 mg by mouth daily. 08/14/21   [provider]  irbesartan (AVAPRO) 150 MG tablet SMARTSIG:1 Tablet(s) By Mouth Every Evening    [provider]  levETIRAcetam (KEPPRA) 500 MG tablet Take 1,000 mg by mouth daily. 08/09/21   [provider]  lidocaine-prilocaine (EMLA) cream Apply 1 Application topically 3 (three) times a week. 06/04/22   [provider]  Multiple Vitamin (MULTIVITAMIN) tablet Take 1 tablet by mouth daily.    [provider]  oxybutynin (DITROPAN-XL) 5 MG 24 hr tablet Take 5 mg by mouth daily. 10/14/22   [provider]  pantoprazole (PROTONIX) 40 MG tablet Take 40 mg by mouth daily. 08/03/22   [provider]  sevelamer carbonate (RENVELA) 800 MG tablet Take 800 mg by mouth 3 (three) times daily with meals.    [provider]    Physical Exam: Vitals:   11/08/22 1630 11/08/22 1645 11/08/22 1700 11/08/22 1745  BP: (!) 170/87 (!) 164/94 (!) 175/82 (!) 169/81  Pulse: 85 85 88 80  Resp: (!) 26 (!) 23 (!) 21 17  Temp:      TempSrc:      SpO2: 100% 100% 97% 97%  Weight:      Height:       General: Middle age female. Awake and alert and oriented x3. No acute cardiopulmonary distress.  HEENT: Normocephalic atraumatic.  Right and left ears normal in appearance.  Pupils equal, round, reactive to light. Extraocular muscles are intact. Sclerae anicteric and noninjected.  Moist mucosal membranes. No mucosal lesions.  Neck: Neck supple without lymphadenopathy. No carotid bruits. No masses palpated.  Cardiovascular: Regular rate with normal S1-S2 sounds. No murmurs, rubs, gallops auscultated. No JVD.  Respiratory: Good respiratory effort with no wheezes, rales, rhonchi.  Lungs clear to auscultation bilaterally.  No accessory muscle use. Abdomen: Soft, nontender, nondistended. Active bowel sounds. No masses or hepatosplenomegaly  Skin: No rashes, lesions, or ulcerations.  Dry, warm to touch. 2+ dorsalis pedis and radial pulses. Musculoskeletal: No calf or leg pain. All major joints not erythematous nontender.  No upper or lower joint deformation.  Good ROM.  No contractures  Psychiatric: Intact judgment and insight. Pleasant and cooperative. Neurologic: No focal neurological deficits. Strength is 5/5 and symmetric in upper and lower extremities.  Cranial nerves II through XII are grossly intact.  Data Reviewed: Results for orders placed or performed during the hospital encounter of 11/08/22 (from the past 24 hour(s))  Comprehensive metabolic panel     Status: Abnormal   Collection Time: 11/08/22  4:24 PM  Result Value Ref Range   Sodium 134 (L) 135 - 145 mmol/L   Potassium 3.1 (L) 3.5 - 5.1 mmol/L   Chloride 94 (L) 98 -  111 mmol/L   CO2 31 22 - 32 mmol/L   Glucose, Bld 93 70 - 99 mg/dL   BUN 15 6 - 20 mg/dL   Creatinine, Ser 1.61 (H) 0.44 - 1.00 mg/dL   Calcium 8.3 (L) 8.9 - 10.3 mg/dL   Total Protein 7.1 6.5 - 8.1 g/dL   Albumin 2.2 (L) 3.5 - 5.0 g/dL   AST 11 (L) 15 - 41 U/L   ALT 10 0 - 44 U/L   Alkaline Phosphatase 88 38 - 126 U/L   Total Bilirubin 0.6 0.3 - 1.2 mg/dL   GFR, Estimated 30 (L) >60 mL/min   Anion gap 9 5 - 15  CBC with Differential     Status: Abnormal   Collection Time: 11/08/22  4:24 PM  Result Value Ref Range   WBC 18.2 (H) 4.0 - 10.5 K/uL   RBC 2.96 (L) 3.87 - 5.11 MIL/uL   Hemoglobin 7.8 (L) 12.0 - 15.0 g/dL   HCT 09.6 (L) 04.5 - 40.9 %   MCV 92.2 80.0 - 100.0 fL   MCH 26.4 26.0 - 34.0 pg   MCHC 28.6 (L) 30.0 - 36.0 g/dL   RDW 81.1 (H) 91.4 - 78.2 %   Platelets 354 150 - 400 K/uL   nRBC 0.0 0.0 - 0.2 %   Neutrophils Relative % 85 %   Neutro Abs 15.5 (H) 1.7 - 7.7 K/uL   Lymphocytes Relative 10 %   Lymphs Abs 1.9 0.7 -  4.0 K/uL   Monocytes Relative 3 %   Monocytes Absolute 0.5 0.1 - 1.0 K/uL   Eosinophils Relative 1 %   Eosinophils Absolute 0.2 0.0 - 0.5 K/uL   Basophils Relative 0 %   Basophils Absolute 0.0 0.0 - 0.1 K/uL   Immature Granulocytes 1 %   Abs Immature Granulocytes 0.12 (H) 0.00 - 0.07 K/uL  Lactic acid, plasma     Status: None   Collection Time: 11/08/22  4:24 PM  Result Value Ref Range   Lactic Acid, Venous 0.9 0.5 - 1.9 mmol/L  Urinalysis, Routine w reflex microscopic -Urine, Catheterized     Status: Abnormal   Collection Time: 11/08/22  4:25 PM  Result Value Ref Range   Color, Urine YELLOW YELLOW   APPearance CLOUDY (A) CLEAR   Specific Gravity, Urine 1.010 1.005 - 1.030   pH 8.0 5.0 - 8.0   Glucose, UA 150 (A) NEGATIVE mg/dL   Hgb urine dipstick SMALL (A) NEGATIVE   Bilirubin Urine NEGATIVE NEGATIVE   Ketones, ur NEGATIVE NEGATIVE mg/dL   Protein, ur >=956 (A) NEGATIVE mg/dL   Nitrite NEGATIVE NEGATIVE   Leukocytes,Ua LARGE (A) NEGATIVE   RBC / HPF 11-20 0 - 5 RBC/hpf   WBC, UA 21-50 0 - 5 WBC/hpf   Bacteria, UA MANY (A) NONE SEEN   Squamous Epithelial / HPF 6-10 0 - 5 /HPF   Renal Epithelial 2    Mucus PRESENT    Amorphous Crystal PRESENT     DG Chest Port 1 View  Result Date: 11/08/2022 CLINICAL DATA:  Cough. Positive blood cultures. History of strep throat and pneumonia on antibiotics. EXAM: PORTABLE CHEST 1 VIEW COMPARISON:  11/07/2022 FINDINGS: Postoperative fixation of the thoracic spine. Cardiac enlargement. Atelectasis or infiltration suggested in the left lung base. Small pleural effusions bilaterally. No pneumothorax. Mediastinal contours appear intact. Calcification of the aorta. IMPRESSION: Infiltration or atelectasis in the left base is similar to prior study. Small bilateral pleural effusions. Electronically Signed   By: Chrissie Noa  Andria Meuse M.D.   On: 11/08/2022 16:36     Assessment and Plan: No notes have been filed under this hospital service. Service:  Hospitalist  Active Problems:   Decubitus ulcers   Paraplegia (HCC)   Suprapubic catheter (HCC)   Essential hypertension   UTI (urinary tract infection)   Left lower lobe pneumonia   ESRD (end stage renal disease) (HCC)   Bacteremia  Bacteremia secondary to community-acquired pneumonia Antibiotics: Rocephin, bank as blood culture showing gram-positive cocci Robitussin Blood cultures drawn in the emergency department Sputum cultures CBC tomorrow Strep and Legionella antigen by urine UTI On rocephin Hypertension Antihypertensives Paraplegia Decubitus ulcers Wound care End-stage renal disease on dialysis M/W/F Consulted nephrology   Advance Care Planning:   Code Status: Full Code   Consults: nephrology for dialysis  Family Communication: none  Severity of Illness: The appropriate patient status for this patient is INPATIENT. Inpatient status is judged to be reasonable and necessary in order to provide the required intensity of service to ensure the patient's safety. The patient's presenting symptoms, physical exam findings, and initial radiographic and laboratory data in the context of their chronic comorbidities is felt to place them at high risk for further clinical deterioration. Furthermore, it is not anticipated that the patient will be medically stable for discharge from the hospital within 2 midnights of admission.   * I certify that at the point of admission it is my clinical judgment that the patient will require inpatient hospital care spanning beyond 2 midnights from the point of admission due to high intensity of service, high risk for further deterioration and high frequency of surveillance required.*  Author: Levie Heritage, DO 11/08/2022 7:09 PM  For on call review [email or web address].

## 2022-11-08 NOTE — ED Notes (Signed)
Both sets of blood cultures obtained before any antibiotic administration  

## 2022-11-08 NOTE — Progress Notes (Signed)
Pharmacy Antibiotic Note  [name] is a 57 y.o. female admitted on 11/08/2022 with bacteremia.  Pharmacy has been consulted for vancomycin dosing.  Plan: Vancomycin 1250 mg IV x 1 dose. Vancomycin 500 mg IV every MWF w/ HD Monitor labs, c/s, and vanco levels as indicated.  Height: 5\' 5"  (165.1 cm) Weight: 59 kg (130 lb) IBW/kg (Calculated) : 57  Temp (24hrs), Avg:98.5 F (36.9 C), Min:97.7 F (36.5 C), Max:98.9 F (37.2 C)  Recent Labs  Lab 11/07/22 2032  WBC 20.8*  CREATININE 4.01*  LATICACIDVEN 1.1    Estimated Creatinine Clearance: 14.4 mL/min (A) (by C-G formula based on SCr of 4.01 mg/dL (H)).    Allergies  Allergen Reactions   Benadryl [Diphenhydramine Hcl (Sleep)] Hives   Quinine Derivatives Other (See Comments)    Alters mental status   Vancomycin     Pt is tolerating this medication at HD   Azithromycin Itching and Rash   Tetracycline Itching    Able to tolerate Doxycycline.    Zosyn [Piperacillin Sod-Tazobactam So] Rash    Antimicrobials this admission: Vanco 6/14  >> CTX 6/14  >>     Microbiology results: 6/14 BCx: pending 6/13 Bcx: gram + cocci in 4/4 bottles 6/14 UCx: pending    Thank you for allowing pharmacy to be a part of this patient's care.  Judeth Cornfield, PharmD Clinical Pharmacist 11/08/2022 4:26 PM

## 2022-11-08 NOTE — ED Notes (Signed)
Pt is in a motorized wheelchair, Hoyer lift used to transfer pt from wheelchair to bed

## 2022-11-08 NOTE — ED Notes (Signed)
Pt called per EDP about positive blood cultures and her needing to come to the ED to be tx. Pt verbalized understanding and will try to come after dialysis today.

## 2022-11-08 NOTE — ED Triage Notes (Addendum)
Pt returned to ER following a call to report positive blood cultures taken yesterday. Pt was dx with strep throat and pneumonia and discharged home with abx prescription, picked up today but not yet started. Pt also had dialysis this morning.

## 2022-11-08 NOTE — ED Notes (Signed)
Restricted extremity armband placed on L wrist

## 2022-11-08 NOTE — ED Notes (Signed)
Catheter bag changed per pts request

## 2022-11-08 NOTE — ED Provider Notes (Signed)
[place] EMERGENCY DEPARTMENT AT [facility] Provider Note  CSN: [id number] Arrival date & time: 11/08/22 1509  Chief Complaint(s) Abnormal Lab  HPI [name] is a 57 y.o. female with history of chronic wounds with chronic osteomyelitis, end-stage renal disease,, paraplegia presenting to the emergency department with abnormal lab.  Patient was seen in the emergency department yesterday, had sore throat and cough, blood cultures were obtained which returned positive for gram-positive cocci.  Patient reports that she has been having high fevers.  She did receive dose of antibiotics in the emergency department (ceftriaxone) last night and reports that she has not had any fever since then.  She went to dialysis today which went uneventfully.  She reports persistent cough.  No shortness of breath.  No chest pain.  She reports that her wounds seem at baseline without increased drainage per daughter who changes her dressings.  No abdominal pain.   Past Medical History Past Medical History:  Diagnosis Date   Abnormal uterine bleeding (AUB) 06/15/2014   Cancer (HCC)    uterine   High blood pressure    Paraplegia (lower)    Seizure disorder (HCC)    Seizures (HCC)    Suprapubic catheter (HCC)    Urinary tract infection    Patient Active Problem List   Diagnosis Date Noted   Bacteremia 11/08/2022   Loss of weight 10/26/2022   N&V (nausea and vomiting) 10/26/2022   Cellulitis of knee, left 07/24/2022   Infected decubitus ulcer 07/23/2022   Osteomyelitis of multiple sites-chronic     Chronic anemia 02/25/2022   ESRD (end stage renal disease) (HCC) 02/25/2022   Abscess of buttock, right 08/25/2021   GERD (gastroesophageal reflux disease) 08/25/2021   Anxiety 08/25/2021   Decubitus ulcers 08/25/2021   Anemia 08/24/2021   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 chronic kidney disease (HCC) 04/25/2017   AKI (acute kidney injury) (HCC) 04/25/2017   Dehydration 04/25/2017   CKD (chronic  kidney disease) stage 4, GFR 15-29 ml/min (HCC) 04/25/2017   Metabolic acidosis 04/25/2017   Altered mental status 06/22/2016   Altered mental state 08/16/2015   Pressure ulcer 05/16/2015   Acute encephalopathy 05/15/2015   Malnutrition (HCC)    Encephalopathy acute 10/10/2014   Sepsis (HCC) 10/10/2014   HCAP (healthcare-associated pneumonia) 10/10/2014   Encephalomalacia 10/10/2014   TBI (traumatic brain injury) (HCC) 10/10/2014   Severe protein-calorie malnutrition (HCC) 10/10/2014   UTI (urinary tract infection) 08/26/2014   Hypokalemia 08/26/2014   Abnormal uterine bleeding (AUB) 06/15/2014   Essential hypertension    Seizure disorder (HCC)    Status epilepticus (HCC) 05/02/2014   Hypertensive emergency without congestive heart failure 05/02/2014   Acute renal failure (HCC) 05/21/2013   Hyperkalemia 05/21/2013   Hypothermia 05/21/2013   Paraplegia (HCC) 06/25/2012   Suprapubic catheter (HCC) 06/25/2012   Decubitus ulcer of buttock 06/25/2012   Home Medication(s) Prior to Admission medications   Medication Sig Start Date End Date Taking? Authorizing Provider  acetaminophen (TYLENOL) 500 MG tablet Take 1,000 mg by mouth every 6 (six) hours as needed for moderate pain.    [provider]  albuterol (VENTOLIN HFA) 108 (90 Base) MCG/ACT inhaler Inhale 1-2 puffs into the lungs every 6 (six) hours as needed for wheezing or shortness of breath. 10/12/22   Prosperi, Christian H, PA-C  amLODipine (NORVASC) 5 MG tablet Take 5 mg by mouth See admin instructions. Take 1 tablet on Monday,Wednesday and Friday then take 2 tablets on Tuesday,Thursday,Saturday and Sunday. 11/24/18  [provider]  B Complex-C-Folic Acid (RENA-VITE RX PO) Take 1 tablet by mouth daily.    [provider]  buprenorphine (BUTRANS) 7.5 MCG/HR Place onto the skin. 10/03/22   [provider]  carvedilol (COREG) 6.25 MG tablet Take 6.25 mg by mouth 2 (two) times daily with a meal.     [provider]  cefUROXime (CEFTIN) 250 MG tablet Take 1 tablet (250 mg total) by mouth 2 (two) times daily with a meal. 11/07/22   Terrilee Files, MD  cloNIDine (CATAPRES) 0.1 MG tablet Take by mouth. 12/19/21   [provider]  docusate sodium (COLACE) 100 MG capsule Take 100 mg by mouth at bedtime.    [provider]  furosemide (LASIX) 20 MG tablet Take 20 mg by mouth daily. 08/14/21   [provider]  irbesartan (AVAPRO) 150 MG tablet SMARTSIG:1 Tablet(s) By Mouth Every Evening    [provider]  levETIRAcetam (KEPPRA) 500 MG tablet Take 1,000 mg by mouth daily. 08/09/21   [provider]  lidocaine-prilocaine (EMLA) cream Apply 1 Application topically 3 (three) times a week. 06/04/22   [provider]  Multiple Vitamin (MULTIVITAMIN) tablet Take 1 tablet by mouth daily.    [provider]  oxybutynin (DITROPAN-XL) 5 MG 24 hr tablet Take 5 mg by mouth daily. 10/14/22   [provider]  pantoprazole (PROTONIX) 40 MG tablet Take 40 mg by mouth daily. 08/03/22   [provider]  sevelamer carbonate (RENVELA) 800 MG tablet Take 800 mg by mouth 3 (three) times daily with meals.    [provider]                                                                                                                                    Past Surgical History Past Surgical History:  Procedure Laterality Date   APPLICATION OF WOUND VAC Right 09/13/2021   (approximately 1-22mos ago) pressure sore on right hip   BACK SURGERY     Pt stated "before 2000"   ESOPHAGOGASTRODUODENOSCOPY N/A 09/20/2015   Procedure: ESOPHAGOGASTRODUODENOSCOPY (EGD);  Surgeon: Malissa Hippo, MD;  Location: [facility];  Service: Endoscopy;  Laterality: N/A;  730   IR CATHETER TUBE CHANGE  04/02/2018   PERCUTANEOUS ENDOSCOPIC GASTROSTOMY (PEG) REMOVAL N/A 09/20/2015   Procedure: PERCUTANEOUS ENDOSCOPIC GASTROSTOMY (PEG) REMOVAL;  Surgeon:  Malissa Hippo, MD;  Location: [facility];  Service: Endoscopy;  Laterality: N/A;   Family History Family History  Problem Relation Age of Onset   Cancer Mother    Hypertension Mother    Cancer Sister        breast and then spread everywhere.   Diabetes Paternal Grandmother    Hypertension Paternal Grandmother     Social History Social History   Tobacco Use   Smoking status: Never   Smokeless tobacco: Never  Vaping Use   Vaping Use: Never used  Substance Use Topics   Alcohol use: Yes    Comment: Occassionally   Drug use: No   Allergies Benadryl [diphenhydramine hcl (sleep)], Quinine derivatives, Vancomycin, Azithromycin, Tetracycline, and Zosyn [piperacillin sod-tazobactam so]  Review of Systems Review of Systems  All other systems reviewed and are negative.   Physical Exam Vital Signs  I have reviewed the triage vital signs BP (!) 169/81   Pulse 80   Temp 97.7 F (36.5 C) (Oral)   Resp 17   Ht 5\' 5"  (1.651 m)   Wt 59 kg   LMP  (LMP Unknown)   SpO2 97%   BMI 21.63 kg/m  Physical Exam Vitals and nursing note reviewed.  Constitutional:      General: She is not in acute distress.    Appearance: She is well-developed.  HENT:     Head: Normocephalic and atraumatic.     Mouth/Throat:     Mouth: Mucous membranes are moist.  Eyes:     Pupils: Pupils are equal, round, and reactive to light.  Cardiovascular:     Rate and Rhythm: Normal rate and regular rhythm.     Heart sounds: No murmur heard. Pulmonary:     Effort: Pulmonary effort is normal. No respiratory distress.     Breath sounds: Examination of the left-middle field reveals rhonchi. Examination of the left-lower field reveals rhonchi. Rhonchi present.     Comments: Frequent coughing Abdominal:     General: Abdomen is flat.     Palpations: Abdomen is soft.     Tenderness: There is no abdominal tenderness.  Musculoskeletal:        General: No tenderness.     Right lower leg: No edema.     Left  lower leg: No edema.  Skin:    General: Skin is warm and dry.     Comments: 2 gluteal wounds, 1 on left, 1 on right, with dressing in place, appear chronic, no surrounding erythema, no obvious drainage  Neurological:     General: No focal deficit present.     Mental Status: She is alert. Mental status is at baseline.  Psychiatric:        Mood and Affect: Mood normal.        Behavior: Behavior normal.     ED Results and Treatments Labs (all labs ordered are listed, but only abnormal results are displayed) Labs Reviewed  COMPREHENSIVE METABOLIC PANEL - Abnormal; Notable for the following components:      Result Value   Sodium 134 (*)    Potassium 3.1 (*)    Chloride 94 (*)    Creatinine, Ser 1.95 (*)    Calcium 8.3 (*)    Albumin 2.2 (*)    AST 11 (*)    GFR, Estimated 30 (*)    All other components within normal limits  CBC WITH DIFFERENTIAL/PLATELET - Abnormal; Notable for the following components:   WBC 18.2 (*)    RBC 2.96 (*)    Hemoglobin 7.8 (*)    HCT 27.3 (*)    MCHC 28.6 (*)    RDW 18.8 (*)    Neutro Abs 15.5 (*)    Abs Immature Granulocytes 0.12 (*)    All other components within normal limits  URINALYSIS, ROUTINE W REFLEX MICROSCOPIC - Abnormal; Notable for the following components:   APPearance CLOUDY (*)    Glucose, UA 150 (*)    Hgb urine dipstick SMALL (*)    Protein, ur >=300 (*)    Leukocytes,Ua LARGE (*)  Bacteria, UA MANY (*)    All other components within normal limits  URINE CULTURE  CULTURE, BLOOD (ROUTINE X 2)  CULTURE, BLOOD (ROUTINE X 2)  LACTIC ACID, PLASMA  LACTIC ACID, PLASMA                                                                                                                          Radiology DG Chest Port 1 View  Result Date: 11/08/2022 CLINICAL DATA:  Cough. Positive blood cultures. History of strep throat and pneumonia on antibiotics. EXAM: PORTABLE CHEST 1 VIEW COMPARISON:  11/07/2022 FINDINGS: Postoperative fixation  of the thoracic spine. Cardiac enlargement. Atelectasis or infiltration suggested in the left lung base. Small pleural effusions bilaterally. No pneumothorax. Mediastinal contours appear intact. Calcification of the aorta. IMPRESSION: Infiltration or atelectasis in the left base is similar to prior study. Small bilateral pleural effusions. Electronically Signed   By: Burman Nieves M.D.   On: 11/08/2022 16:36   DG Chest 2 View  Result Date: 11/07/2022 CLINICAL DATA:  Cough EXAM: CHEST - 2 VIEW COMPARISON:  Previous studies including the examination of 10/12/2022 FINDINGS: Transverse diameter of heart is increased. Apparent shift of mediastinum to the left may be due to rotation. Increased markings are seen in medial left lower lung field. There is blunting of both lateral CP angles. There is no pneumothorax. There is surgical fusion in thoracic spine. There is deformity in the right clavicle from previous injury. Medial end of right clavicle is not seen. IMPRESSION: Increased markings are seen in medial left lower lung fields suggesting atelectasis/pneumonia. There is blunting of both lateral CP angles suggesting small bilateral pleural effusions. Electronically Signed   By: Ernie Avena M.D.   On: 11/07/2022 18:50    Pertinent labs & imaging results that were available during my care of the patient were reviewed by me and considered in my medical decision making (see MDM for details).  Medications Ordered in ED Medications  vancomycin (VANCOREADY) IVPB 1250 mg/250 mL (1,250 mg Intravenous New Bag/Given 11/08/22 1712)  vancomycin (VANCOREADY) IVPB 500 mg/100 mL (has no administration in time range)  cefTRIAXone (ROCEPHIN) 1 g in sodium chloride 0.9 % 100 mL IVPB (0 g Intravenous Stopped 11/08/22 1734)  Procedures .Critical Care  Performed by: Lonell Grandchild,  MD Authorized by: Lonell Grandchild, MD   Critical care provider statement:    Critical care time (minutes):  30   Critical care was necessary to treat or prevent imminent or life-threatening deterioration of the following conditions:  Sepsis   Critical care was time spent personally by me on the following activities:  Development of treatment plan with patient or surrogate, discussions with consultants, evaluation of patient's response to treatment, examination of patient, ordering and review of laboratory studies, ordering and review of radiographic studies, ordering and performing treatments and interventions, pulse oximetry, re-evaluation of patient's condition and review of old charts   Care discussed with: admitting provider     (including critical care time)  Medical Decision Making / ED Course   MDM:  57 year old female presenting to the emergency department abnormal lab.  Patient was seen in the emergency department yesterday, labs obtained yesterday with leukocytosis, diagnosed with a possible pneumonia and prescribed antibiotics.  She has not taken these up yet.  She did receive dose of ceftriaxone yesterday.  Given pneumonia, suspect patient possibly has strep pneumoniae, given patient is dialysis patient she is high risk for bacteremia and will need admission.  Will repeat cultures today.  Also cover with vancomycin to cover for streptococcal infection as well.  Cultures were positive in both aerobic and anaerobic bottles so less likely contaminant  Clinical Course as of 11/08/22 1816  Fri Nov 08, 2022  1739 Urine is from SPT. Will culture but low concern this is source of infection. No abdominal pain or tenderness on exam [WS]  1814 Discussed workup with Dr. Adrian Blackwater, he is admitted the patient for further management of her bacteremia. [WS]    Clinical Course User Index [WS] Lonell Grandchild, MD     Additional history obtained: -Additional history obtained from  ems -External records from outside source obtained and reviewed including: Chart review including previous notes, labs, imaging, consultation notes including ER note from yesterday   Lab Tests: -I ordered, reviewed, and interpreted labs.   The pertinent results include:   Labs Reviewed  COMPREHENSIVE METABOLIC PANEL - Abnormal; Notable for the following components:      Result Value   Sodium 134 (*)    Potassium 3.1 (*)    Chloride 94 (*)    Creatinine, Ser 1.95 (*)    Calcium 8.3 (*)    Albumin 2.2 (*)    AST 11 (*)    GFR, Estimated 30 (*)    All other components within normal limits  CBC WITH DIFFERENTIAL/PLATELET - Abnormal; Notable for the following components:   WBC 18.2 (*)    RBC 2.96 (*)    Hemoglobin 7.8 (*)    HCT 27.3 (*)    MCHC 28.6 (*)    RDW 18.8 (*)    Neutro Abs 15.5 (*)    Abs Immature Granulocytes 0.12 (*)    All other components within normal limits  URINALYSIS, ROUTINE W REFLEX MICROSCOPIC - Abnormal; Notable for the following components:   APPearance CLOUDY (*)    Glucose, UA 150 (*)    Hgb urine dipstick SMALL (*)    Protein, ur >=300 (*)    Leukocytes,Ua LARGE (*)    Bacteria, UA MANY (*)    All other components within normal limits  URINE CULTURE  CULTURE, BLOOD (ROUTINE X 2)  CULTURE, BLOOD (ROUTINE X 2)  LACTIC ACID, PLASMA  LACTIC ACID, PLASMA  Notable for leukocytosis. Normal lactate  Imaging Studies ordered: I ordered imaging studies including CXR On my interpretation imaging demonstrates LLL PNA I independently visualized and interpreted imaging. I agree with the radiologist interpretation   Medicines ordered and prescription drug management: Meds ordered this encounter  Medications   cefTRIAXone (ROCEPHIN) 1 g in sodium chloride 0.9 % 100 mL IVPB    Order Specific Question:   Antibiotic Indication:    Answer:   CAP   vancomycin (VANCOREADY) IVPB 1250 mg/250 mL    Order Specific Question:   Indication:    Answer:    Bacteremia   vancomycin (VANCOREADY) IVPB 500 mg/100 mL    Order Specific Question:   Indication:    Answer:   Bacteremia    -I have reviewed the patients home medicines and have made adjustments as needed   Consultations Obtained: I requested consultation with the hospitalist,  and discussed lab and imaging findings as well as pertinent plan - they recommend: admission   Cardiac Monitoring: The patient was maintained on a cardiac monitor.  I personally viewed and interpreted the cardiac monitored which showed an underlying rhythm of: NSR  Reevaluation: After the interventions noted above, I reevaluated the patient and found that their symptoms have improved  Co morbidities that complicate the patient evaluation  Past Medical History:  Diagnosis Date   Abnormal uterine bleeding (AUB) 06/15/2014   Cancer (HCC)    uterine   High blood pressure    Paraplegia (lower)    Seizure disorder (HCC)    Seizures (HCC)    Suprapubic catheter (HCC)    Urinary tract infection       Dispostion: Disposition decision including need for hospitalization was considered, and patient admitted to the hospital.    Final Clinical Impression(s) / ED Diagnoses Final diagnoses:  Bacteremia     This chart was dictated using voice recognition software.  Despite best efforts to proofread,  errors can occur which can change the documentation meaning.    Lonell Grandchild, MD 11/08/22 1816

## 2022-11-09 ENCOUNTER — Inpatient Hospital Stay (HOSPITAL_COMMUNITY): Payer: 59

## 2022-11-09 DIAGNOSIS — R7881 Bacteremia: Secondary | ICD-10-CM | POA: Diagnosis not present

## 2022-11-09 DIAGNOSIS — I10 Essential (primary) hypertension: Secondary | ICD-10-CM

## 2022-11-09 DIAGNOSIS — J189 Pneumonia, unspecified organism: Secondary | ICD-10-CM | POA: Diagnosis not present

## 2022-11-09 DIAGNOSIS — N186 End stage renal disease: Secondary | ICD-10-CM

## 2022-11-09 LAB — BLOOD CULTURE ID PANEL (REFLEXED) - BCID2
A.calcoaceticus-baumannii: NOT DETECTED
Bacteroides fragilis: NOT DETECTED
Candida albicans: NOT DETECTED
Candida auris: NOT DETECTED
Candida glabrata: NOT DETECTED
Candida krusei: NOT DETECTED
Candida parapsilosis: NOT DETECTED
Candida tropicalis: NOT DETECTED
Cryptococcus neoformans/gattii: NOT DETECTED
Enterobacter cloacae complex: NOT DETECTED
Enterobacterales: NOT DETECTED
Enterococcus Faecium: NOT DETECTED
Enterococcus faecalis: DETECTED — AB
Escherichia coli: NOT DETECTED
Haemophilus influenzae: NOT DETECTED
Klebsiella aerogenes: NOT DETECTED
Klebsiella oxytoca: NOT DETECTED
Klebsiella pneumoniae: NOT DETECTED
Listeria monocytogenes: NOT DETECTED
Neisseria meningitidis: NOT DETECTED
Proteus species: NOT DETECTED
Pseudomonas aeruginosa: NOT DETECTED
Salmonella species: NOT DETECTED
Serratia marcescens: NOT DETECTED
Staphylococcus aureus (BCID): NOT DETECTED
Staphylococcus epidermidis: NOT DETECTED
Staphylococcus lugdunensis: NOT DETECTED
Staphylococcus species: DETECTED — AB
Stenotrophomonas maltophilia: NOT DETECTED
Streptococcus agalactiae: NOT DETECTED
Streptococcus pneumoniae: NOT DETECTED
Streptococcus pyogenes: NOT DETECTED
Streptococcus species: NOT DETECTED
Vancomycin resistance: NOT DETECTED

## 2022-11-09 LAB — CULTURE, BLOOD (ROUTINE X 2): Special Requests: ADEQUATE

## 2022-11-09 LAB — ECHOCARDIOGRAM COMPLETE
Area-P 1/2: 6.54 cm2
Calc EF: 58.5 %
Height: 65 in
S' Lateral: 3.5 cm
Single Plane A2C EF: 59.5 %
Single Plane A4C EF: 58.6 %
Weight: 2080 oz

## 2022-11-09 LAB — CBC
HCT: 24.9 % — ABNORMAL LOW (ref 36.0–46.0)
Hemoglobin: 7.1 g/dL — ABNORMAL LOW (ref 12.0–15.0)
MCH: 26.3 pg (ref 26.0–34.0)
MCHC: 28.5 g/dL — ABNORMAL LOW (ref 30.0–36.0)
MCV: 92.2 fL (ref 80.0–100.0)
Platelets: 322 10*3/uL (ref 150–400)
RBC: 2.7 MIL/uL — ABNORMAL LOW (ref 3.87–5.11)
RDW: 18.7 % — ABNORMAL HIGH (ref 11.5–15.5)
WBC: 14 10*3/uL — ABNORMAL HIGH (ref 4.0–10.5)
nRBC: 0 % (ref 0.0–0.2)

## 2022-11-09 MED ORDER — OXYCODONE HCL 5 MG PO TABS
5.0000 mg | ORAL_TABLET | Freq: Once | ORAL | Status: AC | PRN
  Administered 2022-11-09: 5 mg via ORAL
  Filled 2022-11-09: qty 1

## 2022-11-09 MED ORDER — SENNOSIDES-DOCUSATE SODIUM 8.6-50 MG PO TABS
2.0000 | ORAL_TABLET | Freq: Every day | ORAL | Status: DC
  Administered 2022-11-09 – 2022-11-11 (×2): 2 via ORAL
  Filled 2022-11-09 (×3): qty 2

## 2022-11-09 MED ORDER — BUPRENORPHINE 7.5 MCG/HR TD PTWK
1.0000 | MEDICATED_PATCH | TRANSDERMAL | Status: DC
  Administered 2022-11-11: 1 via TRANSDERMAL

## 2022-11-09 NOTE — Consult Note (Addendum)
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  Abx: 6/14 ceftriaxone/vanc        Assessment: 57 yo female esrd on iHD mwf, seizure disorder, paraplegia, chronic spc, hx uterine cancer, chronic sacral debub, seen in ed 11/07/22 for a few months of cough but a few days of fever, sorethroat (chronic sacral decubitus ulcers cared by outpatient wound center and no sign of infection) initially given cefuroxime and sent home, but recalled due to blood culture growing e faecalis   6/13 bcx e faecalis 1 set along with staph species; 2 of 2 set gpc 6/13 ucx reincubated for better growth 6/14 repeat bcx in progress 6/14 ucx in progress   E faecalis bacteremia with long duration from community in terms of sx will need to r/o endocarditis  Staph species while could be contaminant unclear what to make if it at this time given potential endocarditis and it could play a role as well   She dialyzes out of left arm fistula -- need to evaluate for infection here  Chronic cough ?outpatient -- but an echo/tee in r/o endocarditis as mentioned below. Consider chest ct as well noncontrast   Plan: Follow up repeat blood culture Would continue vancomycin Stop ceftriaxone Will have lab workup the staph species Evaluate for sign of infection at the av fistula and else where (bone/joint/back -- for om/septic arthritis) and other sign of metastatic disease such as vision change (endophthalmitis or headache/ams) Consider chest ct for chronic cough evaluate for potential complication septic emboli in the lungs from bacteremia Given duration of sx would get echo tte and if negative tee to r/o endocarditis Depending on repeat bcx and echo result will decide further abx choice/duration  Discussed with primary team  --------- Addendum Group a  strep throat swab positive but likely a bystander. Not contributing to any symptomatology currently Vanc should cover it    I spent more than 15 minute reviewing data/chart, and coordinating care, providing counseling/discussing diagnostics/treatment plan with treatment team    ------------------------------------------------ Active Problems:   Paraplegia (HCC)   Suprapubic catheter (HCC)   Essential hypertension   UTI (urinary tract infection)   Left lower lobe pneumonia   Decubitus ulcers   ESRD (end stage renal disease) (HCC)   Bacteremia    HPI: [name] is a 57 y.o. female esrd with lue avf, chronic sacral decub, seen in ed 6/13 for fever and sorethroat given cefuroxime, but recalled for bcx e faecalis   According to h&p fever had resolved No other sx at this time except feeling better and still a lot of cough  Repeat bcx in progress  Unchanged cxr (lll opacity)  Abx on ceftriaxone/vanc  Family History  Problem Relation Age of Onset   Cancer Mother    Hypertension Mother    Cancer Sister        breast and then spread everywhere.   Diabetes Paternal Grandmother    Hypertension Paternal Grandmother     Social History   Tobacco Use   Smoking status: Never   Smokeless tobacco: Never  Vaping Use   Vaping Use: Never used  Substance Use Topics   Alcohol use: Yes    Comment: Occassionally   Drug use: No    Allergies  Allergen Reactions  Benadryl [Diphenhydramine Hcl (Sleep)] Hives   Quinine Derivatives Other (See Comments)    Alters mental status   Vancomycin     Pt is tolerating this medication at HD   Azithromycin Itching and Rash   Tetracycline Itching    Able to tolerate Doxycycline.    Zosyn [Piperacillin Sod-Tazobactam So] Rash    Review of Systems: ROS All Other ROS was negative, except mentioned above   Past Medical History:  Diagnosis Date   Abnormal uterine bleeding (AUB) 06/15/2014   Cancer (HCC)    uterine   High blood  pressure    Paraplegia (lower)    Seizure disorder (HCC)    Seizures (HCC)    Suprapubic catheter (HCC)    Urinary tract infection        Scheduled Meds:  amLODipine  5 mg Oral Q M,W,F   And   amLODipine  10 mg Oral Once per day on Sun Tue Thu Sat   [START ON 11/11/2022] buprenorphine  1 patch Transdermal Weekly   carvedilol  6.25 mg Oral BID WC   docusate sodium  100 mg Oral QHS   enoxaparin (LOVENOX) injection  30 mg Subcutaneous Q24H   furosemide  20 mg Oral Daily   irbesartan  150 mg Oral QPM   levETIRAcetam  1,000 mg Oral Daily   oxybutynin  5 mg Oral Daily   pantoprazole  40 mg Oral Daily   Continuous Infusions:  cefTRIAXone (ROCEPHIN)  IV     [START ON 11/11/2022] vancomycin     PRN Meds:.albuterol, guaiFENesin-dextromethorphan, ondansetron **OR** ondansetron (ZOFRAN) IV   OBJECTIVE: Blood pressure (!) 188/72, pulse 93, temperature 98.2 F (36.8 C), resp. rate 18, height 5\' 5"  (1.651 m), weight 59 kg, SpO2 96 %.  Physical Exam  Reviewed chart  Lab Results Lab Results  Component Value Date   WBC 14.0 (H) 11/09/2022   HGB 7.1 (L) 11/09/2022   HCT 24.9 (L) 11/09/2022   MCV 92.2 11/09/2022   PLT 322 11/09/2022    Lab Results  Component Value Date   CREATININE 1.95 (H) 11/08/2022   BUN 15 11/08/2022   NA 134 (L) 11/08/2022   K 3.1 (L) 11/08/2022   CL 94 (L) 11/08/2022   CO2 31 11/08/2022    Lab Results  Component Value Date   ALT 10 11/08/2022   AST 11 (L) 11/08/2022   ALKPHOS 88 11/08/2022   BILITOT 0.6 11/08/2022      Microbiology: Recent Results (from the past 240 hour(s))  Group A Strep by PCR     Status: Abnormal   Collection Time: 11/07/22  6:00 PM   Specimen: Throat; Sterile Swab  Result Value Ref Range Status   Group A Strep by PCR DETECTED (A) NOT DETECTED Final    Comment: Performed at [facility], [address] [place], [place] [address]  Resp panel by RT-PCR (RSV, Flu A&B, Covid) Throat     Status: None   Collection Time:  11/07/22  6:00 PM   Specimen: Throat; Nasal Swab  Result Value Ref Range Status   SARS Coronavirus 2 by RT PCR NEGATIVE NEGATIVE Final    Comment: (NOTE) SARS-CoV-2 target nucleic acids are NOT DETECTED.  The SARS-CoV-2 RNA is generally detectable in upper respiratory specimens during the acute phase of infection. The lowest concentration of SARS-CoV-2 viral copies this assay can detect is 138 copies/mL. A negative result does not preclude SARS-Cov-2 infection and should not be used as the sole basis for treatment or other  patient management decisions. A negative result may occur with  improper specimen collection/handling, submission of specimen other than nasopharyngeal swab, presence of viral mutation(s) within the areas targeted by this assay, and inadequate number of viral copies(<138 copies/mL). A negative result must be combined with clinical observations, patient history, and epidemiological information. The expected result is Negative.  Fact Sheet for Patients:  BloggerCourse.com  Fact Sheet for Healthcare Providers:  SeriousBroker.it  This test is no t yet approved or cleared by the [place] FDA and  has been authorized for detection and/or diagnosis of SARS-CoV-2 by FDA under an Emergency Use Authorization (EUA). This EUA will remain  in effect (meaning this test can be used) for the duration of the COVID-19 declaration under Section 564(b)(1) of the Act, 21 U.S.C.section 360bbb-3(b)(1), unless the authorization is terminated  or revoked sooner.       Influenza A by PCR NEGATIVE NEGATIVE Final   Influenza B by PCR NEGATIVE NEGATIVE Final    Comment: (NOTE) The Xpert Xpress SARS-CoV-2/FLU/RSV plus assay is intended as an aid in the diagnosis of influenza from Nasopharyngeal swab specimens and should not be used as a sole basis for treatment. Nasal washings and aspirates are unacceptable for Xpert Xpress  SARS-CoV-2/FLU/RSV testing.  Fact Sheet for Patients: BloggerCourse.com  Fact Sheet for Healthcare Providers: SeriousBroker.it  This test is not yet approved or cleared by the [place] FDA and has been authorized for detection and/or diagnosis of SARS-CoV-2 by FDA under an Emergency Use Authorization (EUA). This EUA will remain in effect (meaning this test can be used) for the duration of the COVID-19 declaration under Section 564(b)(1) of the Act, 21 U.S.C. section 360bbb-3(b)(1), unless the authorization is terminated or revoked.     Resp Syncytial Virus by PCR NEGATIVE NEGATIVE Final    Comment: (NOTE) Fact Sheet for Patients: BloggerCourse.com  Fact Sheet for Healthcare Providers: SeriousBroker.it  This test is not yet approved or cleared by the [place] FDA and has been authorized for detection and/or diagnosis of SARS-CoV-2 by FDA under an Emergency Use Authorization (EUA). This EUA will remain in effect (meaning this test can be used) for the duration of the COVID-19 declaration under Section 564(b)(1) of the Act, 21 U.S.C. section 360bbb-3(b)(1), unless the authorization is terminated or revoked.  Performed at [facility], [address] [place], [place] [address]   Urine Culture     Status: None (Preliminary result)   Collection Time: 11/07/22  7:53 PM   Specimen: Urine, Clean Catch  Result Value Ref Range Status   Specimen Description   Final    URINE, CLEAN CATCH Performed at [facility], [address], [place], [place] [address]    Special Requests   Final    NONE Performed at [facility], [address], [place], [place] [address]    Culture   Final    CULTURE REINCUBATED FOR BETTER GROWTH Performed at [facility], [address], [place], [place] [address]    Report Status PENDING  Incomplete  Culture, blood (routine x 2)     Status:  None (Preliminary result)   Collection Time: 11/07/22  8:32 PM   Specimen: BLOOD RIGHT ARM  Result Value Ref Range Status   Specimen Description   Final    BLOOD RIGHT ARM Performed at [facility], [address] [place], [place] [address]    Special Requests   Final    BOTTLES DRAWN AEROBIC AND ANAEROBIC Blood Culture adequate volume  Performed at [facility], [address], [place], [place] [address]    Culture  Setup Time   Final    GRAM POSITIVE COCCI IN BOTH AEROBIC AND ANAEROBIC BOTTLES Gram Stain Report Called to,Read Back By and Verified With: FOWLER D @ 1220 ON [id number] BY HENDERSON L Performed at [facility], [address] [place], [place] [address]    Culture [facility] POSITIVE COCCI  Final   Report Status PENDING  Incomplete  Culture, blood (routine x 2)     Status: None (Preliminary result)   Collection Time: 11/07/22  8:40 PM   Specimen: BLOOD RIGHT ARM  Result Value Ref Range Status   Specimen Description   Final    BLOOD RIGHT ARM Performed at [facility], [address], [place], [place] [address]    Special Requests   Final    BOTTLES DRAWN AEROBIC AND ANAEROBIC Blood Culture adequate volume Performed at [facility], [address], [place], [place] [address]    Culture  Setup Time   Final    GRAM POSITIVE COCCI IN BOTH AEROBIC AND ANAEROBIC BOTTLES CRITICAL RESULT CALLED TO, READ BACK BY AND VERIFIED WITH: FOWLER,D. @ 1421 ON 11/08/2022 BY FRATTO,A. CRITICAL RESULT CALLED TO, READ BACK BY AND VERIFIED WITH: RN Mosetta Anis ON 11/08/22 @ 1753 BY DRT Performed at [facility], [address], [place], [place] [address]    Culture GRAM POSITIVE COCCI  Final   Report Status PENDING  Incomplete  Blood Culture ID Panel (Reflexed)     Status: Abnormal   Collection Time: 11/07/22  8:40 PM  Result Value Ref Range Status   Enterococcus faecalis DETECTED (A) NOT DETECTED Final    Comment: CRITICAL RESULT CALLED TO, READ BACK BY AND VERIFIED WITH: RN  MORGAN EANES ON 11/08/22 @ 1753 BY DRT    Enterococcus Faecium NOT DETECTED NOT DETECTED Final   Listeria monocytogenes NOT DETECTED NOT DETECTED Final   Staphylococcus species DETECTED (A) NOT DETECTED Final    Comment: CRITICAL RESULT CALLED TO, READ BACK BY AND VERIFIED WITH: RN MORGAN EANES ON 11/08/22 @ 1753 BY DRT    Staphylococcus aureus (BCID) NOT DETECTED NOT DETECTED Final   Staphylococcus epidermidis NOT DETECTED NOT DETECTED Final   Staphylococcus lugdunensis NOT DETECTED NOT DETECTED Final   Streptococcus species NOT DETECTED NOT DETECTED Final   Streptococcus agalactiae NOT DETECTED NOT DETECTED Final   Streptococcus pneumoniae NOT DETECTED NOT DETECTED Final   Streptococcus pyogenes NOT DETECTED NOT DETECTED Final   A.calcoaceticus-baumannii NOT DETECTED NOT DETECTED Final   Bacteroides fragilis NOT DETECTED NOT DETECTED Final   Enterobacterales NOT DETECTED NOT DETECTED Final   Enterobacter cloacae complex NOT DETECTED NOT DETECTED Final   Escherichia coli NOT DETECTED NOT DETECTED Final   Klebsiella aerogenes NOT DETECTED NOT DETECTED Final   Klebsiella oxytoca NOT DETECTED NOT DETECTED Final   Klebsiella pneumoniae NOT DETECTED NOT DETECTED Final   Proteus species NOT DETECTED NOT DETECTED Final   Salmonella species NOT DETECTED NOT DETECTED Final   Serratia marcescens NOT DETECTED NOT DETECTED Final   Haemophilus influenzae NOT DETECTED NOT DETECTED Final   Neisseria meningitidis NOT DETECTED NOT DETECTED Final   Pseudomonas aeruginosa NOT DETECTED NOT DETECTED Final   Stenotrophomonas maltophilia NOT DETECTED NOT DETECTED Final   Candida albicans NOT DETECTED NOT DETECTED Final   Candida auris NOT DETECTED NOT DETECTED Final   Candida glabrata NOT DETECTED NOT DETECTED Final   Candida krusei NOT DETECTED NOT  DETECTED Final   Candida parapsilosis NOT DETECTED NOT DETECTED Final   Candida tropicalis NOT DETECTED NOT DETECTED Final   Cryptococcus neoformans/gattii  NOT DETECTED NOT DETECTED Final   Vancomycin resistance NOT DETECTED NOT DETECTED Final    Comment: Performed at [facility], [address], [place], [place] [address]  Culture, blood (routine x 2)     Status: None (Preliminary result)   Collection Time: 11/08/22  4:24 PM   Specimen: BLOOD  Result Value Ref Range Status   Specimen Description BLOOD BLOOD RIGHT ARM  Final   Special Requests   Final    BOTTLES DRAWN AEROBIC AND ANAEROBIC Blood Culture adequate volume   Culture   Final    NO GROWTH < 24 HOURS Performed at [facility], [address] [place], [place] [address]    Report Status PENDING  Incomplete  Culture, blood (routine x 2)     Status: None (Preliminary result)   Collection Time: 11/08/22  4:32 PM   Specimen: BLOOD  Result Value Ref Range Status   Specimen Description BLOOD BLOOD RIGHT ARM  Final   Special Requests   Final    BOTTLES DRAWN AEROBIC AND ANAEROBIC Blood Culture adequate volume   Culture   Final    NO GROWTH < 24 HOURS Performed at [facility], [address], [place], [place] [address]    Report Status PENDING  Incomplete     Serology:    Imaging: If present, new imagings (plain films, ct scans, and mri) have been personally visualized and interpreted; radiology reports have been reviewed. Decision making incorporated into the Impression / Recommendations.  6/14 cxr Infiltration or atelectasis in the left base is similar to prior study. Small bilateral pleural effusions.  615 right ankle xray Pending   Raymondo Band, MD [facility] [phone] pager    11/09/2022, 11:23 AM

## 2022-11-09 NOTE — Progress Notes (Signed)
PROGRESS NOTE     [name], is a 57 y.o. female, DOB - 1965-07-15, ZOX:[id number]  Admit date - 11/08/2022   Admitting Physician No admitting provider for patient encounter.  Outpatient Primary MD for the patient is [name], Christina Reek, FNP  LOS - 1  Chief Complaint  Patient presents with   Abnormal Lab       Brief Narrative:  57 y.o. female with medical history significant of end-stage renal disease on dialysis Monday, Wednesday, Friday, seizure disorder, paraplegia with suprapubic catheter, history of uterine cancer admitted on 11/08/2022 with E faecalis and staph bacteremia    -Assessment and Plan: 1) E faecalis and staph species bacteremia---- Received cefuroxime  as outpatient 6/13 bcx e faecalis 1 set along with staph species; 2 of 2 set gpc 6/13 ucx reincubated for better growth 11/07/22-group A strep throat swab was positive-- 6/14 repeat bcx in progress 6/14 ucx in progress -ID consult from Dr. Renold Don appreciated -Stop Rocephin -Continue IV vancomycin for bloody faecalis and staph species -TTE requested -Patient will need TEE to rule out endocarditis if TTE is not conclusive -Right ankle x-rays without infectious findings -CT chest without contrast to rule out metastatic infection is pending WBC-20.8 >>18.2 >>14.0  2)ESRD--- Monday Wednesday Friday -Last HD 11/08/2022 -Nephrology consult for HD  requested  3) sacral decubitus ulcers--- does not appear to be infected -Continue current wound care  4) paraplegia/wheelchair-bound at baseline--- supportive care  5)HTN-continue Coreg, avapro and amlodipine  may use IV Hydralazine 10 mg  Every 4 hours Prn for systolic blood pressure over 170 mmhg  6)GERD--- continue Protonix  7) chronic anemia of ESRD--- -no bleeding concerns -Hgb around 7 -Defer decision on Procrit/ESA agent to nephrology team  Status is: Inpatient   Disposition: The patient is from: Home              Anticipated d/c is to: Home               Anticipated d/c date is: 3 days              Patient currently is not medically stable to d/c. Barriers: Not Clinically Stable-   Code Status :  -  Code Status: Full Code   Family Communication:   NA (patient is alert, awake and coherent)   DVT Prophylaxis  :   - SCDs  enoxaparin (LOVENOX) injection 30 mg Start: 11/08/22 2200  Lab Results  Component Value Date   PLT 322 11/09/2022   Inpatient Medications  Scheduled Meds:  amLODipine  5 mg Oral Q M,W,F   And   amLODipine  10 mg Oral Once per day on Sun Tue Thu Sat   [START ON 11/11/2022] buprenorphine  1 patch Transdermal Weekly   carvedilol  6.25 mg Oral BID WC   docusate sodium  100 mg Oral QHS   enoxaparin (LOVENOX) injection  30 mg Subcutaneous Q24H   furosemide  20 mg Oral Daily   irbesartan  150 mg Oral QPM   levETIRAcetam  1,000 mg Oral Daily   oxybutynin  5 mg Oral Daily   pantoprazole  40 mg Oral Daily   Continuous Infusions:  [START ON 11/11/2022] vancomycin     PRN Meds:.albuterol, guaiFENesin-dextromethorphan, ondansetron **OR** ondansetron (ZOFRAN) IV   Anti-infectives (From admission, onward)    Start     Dose/Rate Route Frequency Ordered Stop   11/11/22 1200  vancomycin (VANCOREADY) IVPB 500 mg/100 mL        500 mg 100 mL/hr  over 60 Minutes Intravenous Every M-W-F (Hemodialysis) 11/08/22 1623     11/09/22 1800  cefTRIAXone (ROCEPHIN) 1 g in sodium chloride 0.9 % 100 mL IVPB  Status:  Discontinued        1 g 200 mL/hr over 30 Minutes Intravenous Every 24 hours 11/08/22 2018 11/09/22 1139   11/08/22 1630  vancomycin (VANCOREADY) IVPB 1250 mg/250 mL        1,250 mg 166.7 mL/hr over 90 Minutes Intravenous  Once 11/08/22 1621 11/08/22 1842   11/08/22 1615  cefTRIAXone (ROCEPHIN) 1 g in sodium chloride 0.9 % 100 mL IVPB        1 g 200 mL/hr over 30 Minutes Intravenous  Once 11/08/22 1609 11/08/22 1734      Subjective: [name] today has no fevers, no emesis,  No chest pain,   -Complains  of fatigue and malaise   Objective: Vitals:   11/08/22 2351 11/09/22 0329 11/09/22 0955 11/09/22 1222  BP: (!) 164/64 (!) 169/69 (!) 188/72 (!) 145/63  Pulse: 96 93  80  Resp: 18 18  18   Temp: 98.4 F (36.9 C) 98.2 F (36.8 C)  98.5 F (36.9 C)  TempSrc:    Oral  SpO2: 94% 96%  95%  Weight:      Height:        Intake/Output Summary (Last 24 hours) at 11/09/2022 1345 Last data filed at 11/09/2022 1610 Gross per 24 hour  Intake 240 ml  Output 200 ml  Net 40 ml   Filed Weights   11/08/22 1536  Weight: 59 kg   Physical Exam Gen:- Awake Alert,  in no apparent distress , chronically ill and frail appearing HEENT:- [name].AT, No sclera icterus Neck-Supple Neck,No JVD,.  Lungs-  CTAB , fair symmetrical air movement CV- S1, S2 normal, regular  Abd-  +ve B.Sounds, Abd Soft, No tenderness, suprapubic catheter Extremity--  pedal pulses present  Psych-affect is appropriate, oriented x3 Neuro-no new focal deficits, no tremors MSK-right lower leg/ankle area with erythema and swelling and  warmth..  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 and bruit, aneurysmal dilatation noted  Data Reviewed: I have personally reviewed following labs and imaging studies  CBC: Recent Labs  Lab 11/07/22 2032 11/08/22 1624 11/09/22 0328  WBC 20.8* 18.2* 14.0*  NEUTROABS 19.5* 15.5*  --   HGB 9.0* 7.8* 7.1*  HCT 31.6* 27.3* 24.9*  MCV 92.7 92.2 92.2  PLT 411* 354 322   Basic Metabolic Panel: Recent Labs  Lab 11/07/22 2032 11/08/22 1624  NA 133* 134*  K 3.9 3.1*  CL 95* 94*  CO2 26 31  GLUCOSE 105* 93  BUN 32* 15  CREATININE 4.01* 1.95*  CALCIUM 8.3* 8.3*   GFR: Estimated Creatinine Clearance: 29.7 mL/min (A) (by C-G formula based on SCr of 1.95 mg/dL (H)). Liver Function Tests: Recent Labs  Lab 11/08/22 1624  AST 11*  ALT 10  ALKPHOS 88  BILITOT 0.6  PROT 7.1  ALBUMIN 2.2*   Recent Results (from the  past 240 hour(s))  Group A Strep by PCR     Status: Abnormal   Collection Time: 11/07/22  6:00 PM   Specimen: Throat; Sterile Swab  Result Value Ref Range Status   Group A Strep by PCR DETECTED (A) NOT DETECTED Final    Comment: Performed at [facility], [address] [place], [place] [address]  Resp panel by RT-PCR (RSV, Flu A&B, Covid) Throat  Status: None   Collection Time: 11/07/22  6:00 PM   Specimen: Throat; Nasal Swab  Result Value Ref Range Status   SARS Coronavirus 2 by RT PCR NEGATIVE NEGATIVE Final    Comment: (NOTE) SARS-CoV-2 target nucleic acids are NOT DETECTED.  The SARS-CoV-2 RNA is generally detectable in upper respiratory specimens during the acute phase of infection. The lowest concentration of SARS-CoV-2 viral copies this assay can detect is 138 copies/mL. A negative result does not preclude SARS-Cov-2 infection and should not be used as the sole basis for treatment or other patient management decisions. A negative result may occur with  improper specimen collection/handling, submission of specimen other than nasopharyngeal swab, presence of viral mutation(s) within the areas targeted by this assay, and inadequate number of viral copies(<138 copies/mL). A negative result must be combined with clinical observations, patient history, and epidemiological information. The expected result is Negative.  Fact Sheet for Patients:  BloggerCourse.com  Fact Sheet for Healthcare Providers:  SeriousBroker.it  This test is no t yet approved or cleared by the [place] FDA and  has been authorized for detection and/or diagnosis of SARS-CoV-2 by FDA under an Emergency Use Authorization (EUA). This EUA will remain  in effect (meaning this test can be used) for the duration of the COVID-19 declaration under Section 564(b)(1) of the Act, 21 U.S.C.section 360bbb-3(b)(1), unless the authorization is terminated  or  revoked sooner.       Influenza A by PCR NEGATIVE NEGATIVE Final   Influenza B by PCR NEGATIVE NEGATIVE Final    Comment: (NOTE) The Xpert Xpress SARS-CoV-2/FLU/RSV plus assay is intended as an aid in the diagnosis of influenza from Nasopharyngeal swab specimens and should not be used as a sole basis for treatment. Nasal washings and aspirates are unacceptable for Xpert Xpress SARS-CoV-2/FLU/RSV testing.  Fact Sheet for Patients: BloggerCourse.com  Fact Sheet for Healthcare Providers: SeriousBroker.it  This test is not yet approved or cleared by the [place] FDA and has been authorized for detection and/or diagnosis of SARS-CoV-2 by FDA under an Emergency Use Authorization (EUA). This EUA will remain in effect (meaning this test can be used) for the duration of the COVID-19 declaration under Section 564(b)(1) of the Act, 21 U.S.C. section 360bbb-3(b)(1), unless the authorization is terminated or revoked.     Resp Syncytial Virus by PCR NEGATIVE NEGATIVE Final    Comment: (NOTE) Fact Sheet for Patients: BloggerCourse.com  Fact Sheet for Healthcare Providers: SeriousBroker.it  This test is not yet approved or cleared by the [place] FDA and has been authorized for detection and/or diagnosis of SARS-CoV-2 by FDA under an Emergency Use Authorization (EUA). This EUA will remain in effect (meaning this test can be used) for the duration of the COVID-19 declaration under Section 564(b)(1) of the Act, 21 U.S.C. section 360bbb-3(b)(1), unless the authorization is terminated or revoked.  Performed at [facility], [address] [place], [place] [address]   Urine Culture     Status: None (Preliminary result)   Collection Time: 11/07/22  7:53 PM   Specimen: Urine, Clean Catch  Result Value Ref Range Status   Specimen Description   Final    URINE, CLEAN CATCH Performed  at [facility], [address] [place], [place] [address]    Special Requests   Final    NONE Performed at [facility], [address] [place], [place] [address]    Culture   Final    CULTURE REINCUBATED FOR BETTER GROWTH Performed at [facility], [address] [place], [place] [address]    Report Status PENDING  Incomplete  Culture, blood (routine x 2)     Status: Abnormal (Preliminary result)   Collection Time: 11/07/22  8:32 PM   Specimen: BLOOD RIGHT ARM  Result Value Ref Range Status   Specimen Description   Final    BLOOD RIGHT ARM Performed at [facility], [address], [place], [place] [address]    Special Requests   Final    BOTTLES DRAWN AEROBIC AND ANAEROBIC Blood Culture adequate volume Performed at [facility], [address] [place], [place] [address]    Culture  Setup Time   Final    GRAM POSITIVE COCCI IN BOTH AEROBIC AND ANAEROBIC BOTTLES Gram Stain Report Called to,Read Back By and Verified With: FOWLER D @ 1220 ON [id number] BY HENDERSON L Performed at [facility], [address] [place], [place] [address]    Culture (A)  Final    STREPTOCOCCUS GROUP C Beta hemolytic streptococci are predictably susceptible to penicillin and other beta lactams. Susceptibility testing not routinely performed. Performed at [facility], [address], [place], [place] [address]    Report Status PENDING  Incomplete  Culture, blood (routine x 2)     Status: Abnormal (Preliminary result)   Collection Time: 11/07/22  8:40 PM   Specimen: BLOOD RIGHT ARM  Result Value Ref Range Status   Specimen Description   Final    BLOOD RIGHT ARM Performed at [facility], [address], [place], [place] [address]    Special Requests   Final    BOTTLES DRAWN AEROBIC AND ANAEROBIC Blood Culture adequate volume Performed at [facility], [address] [place], [place] [address]    Culture  Setup Time   Final    GRAM POSITIVE COCCI IN BOTH AEROBIC AND  ANAEROBIC BOTTLES CRITICAL RESULT CALLED TO, READ BACK BY AND VERIFIED WITH: FOWLER,D. @ 1421 ON 11/08/2022 BY FRATTO,A. CRITICAL RESULT CALLED TO, READ BACK BY AND VERIFIED WITH: RN MORGAN EANES ON 11/08/22 @ 1753 BY DRT    Culture (A)  Final    ENTEROCOCCUS FAECALIS STAPHYLOCOCCUS HAEMOLYTICUS SUSCEPTIBILITIES TO FOLLOW Performed at [facility], [address] [place], [place] [address]    Report Status PENDING  Incomplete  Blood Culture ID Panel (Reflexed)     Status: Abnormal   Collection Time: 11/07/22  8:40 PM  Result Value Ref Range Status   Enterococcus faecalis DETECTED (A) NOT DETECTED Final    Comment: CRITICAL RESULT CALLED TO, READ BACK BY AND VERIFIED WITH: RN MORGAN EANES ON 11/08/22 @ 1753 BY DRT    Enterococcus Faecium NOT DETECTED NOT DETECTED Final   Listeria monocytogenes NOT DETECTED NOT DETECTED Final   Staphylococcus species DETECTED (A) NOT DETECTED Final    Comment: CRITICAL RESULT CALLED TO, READ BACK BY AND VERIFIED WITH: RN MORGAN EANES ON 11/08/22 @ 1753 BY DRT    Staphylococcus aureus (BCID) NOT DETECTED NOT DETECTED Final   Staphylococcus epidermidis NOT DETECTED NOT DETECTED Final   Staphylococcus lugdunensis NOT DETECTED NOT DETECTED Final   Streptococcus species NOT DETECTED NOT DETECTED Final   Streptococcus agalactiae NOT DETECTED NOT DETECTED Final   Streptococcus pneumoniae NOT DETECTED NOT DETECTED Final   Streptococcus pyogenes NOT DETECTED NOT DETECTED Final   A.calcoaceticus-baumannii NOT DETECTED NOT DETECTED Final   Bacteroides fragilis NOT DETECTED NOT DETECTED Final   Enterobacterales NOT DETECTED NOT DETECTED Final  Enterobacter cloacae complex NOT DETECTED NOT DETECTED Final   Escherichia coli NOT DETECTED NOT DETECTED Final   Klebsiella aerogenes NOT DETECTED NOT DETECTED Final   Klebsiella oxytoca NOT DETECTED NOT DETECTED Final   Klebsiella pneumoniae NOT DETECTED NOT DETECTED Final   Proteus species NOT DETECTED NOT  DETECTED Final   Salmonella species NOT DETECTED NOT DETECTED Final   Serratia marcescens NOT DETECTED NOT DETECTED Final   Haemophilus influenzae NOT DETECTED NOT DETECTED Final   Neisseria meningitidis NOT DETECTED NOT DETECTED Final   Pseudomonas aeruginosa NOT DETECTED NOT DETECTED Final   Stenotrophomonas maltophilia NOT DETECTED NOT DETECTED Final   Candida albicans NOT DETECTED NOT DETECTED Final   Candida auris NOT DETECTED NOT DETECTED Final   Candida glabrata NOT DETECTED NOT DETECTED Final   Candida krusei NOT DETECTED NOT DETECTED Final   Candida parapsilosis NOT DETECTED NOT DETECTED Final   Candida tropicalis NOT DETECTED NOT DETECTED Final   Cryptococcus neoformans/gattii NOT DETECTED NOT DETECTED Final   Vancomycin resistance NOT DETECTED NOT DETECTED Final    Comment: Performed at [facility], [address], [place], [place] [address]  Culture, blood (routine x 2)     Status: None (Preliminary result)   Collection Time: 11/08/22  4:24 PM   Specimen: BLOOD  Result Value Ref Range Status   Specimen Description BLOOD BLOOD RIGHT ARM  Final   Special Requests   Final    BOTTLES DRAWN AEROBIC AND ANAEROBIC Blood Culture adequate volume   Culture   Final    NO GROWTH < 24 HOURS Performed at [facility], [address] [place], [place] [address]    Report Status PENDING  Incomplete  Culture, blood (routine x 2)     Status: None (Preliminary result)   Collection Time: 11/08/22  4:32 PM   Specimen: BLOOD  Result Value Ref Range Status   Specimen Description BLOOD BLOOD RIGHT ARM  Final   Special Requests   Final    BOTTLES DRAWN AEROBIC AND ANAEROBIC Blood Culture adequate volume   Culture   Final    NO GROWTH < 24 HOURS Performed at [facility], [address], [place], [place] [address]    Report Status PENDING  Incomplete    Radiology Studies: DG Ankle 2 Views Right  Result Date: 11/09/2022 CLINICAL DATA:  144615 Pain 144615 EXAM: RIGHT ANKLE - 2 VIEW  COMPARISON:  None Available. FINDINGS: Severe osteopenia. There is a favored subacute to remote oblique distal tibial fracture. Mild posterior displacement of the distal fragment. Adjacent callus formation. Lucency of the medial malleolus is nonspecific. Soft tissue edema. IMPRESSION: Profound osteopenia limits evaluation. There is a favored subacute to remote oblique distal tibial fracture. Lucency of the tip of the medial malleolus is nonspecific and may reflect an additional fracture. Recommend correlation with point tenderness. Electronically Signed   By: Meda Klinefelter M.D.   On: 11/09/2022 11:28   DG Chest Port 1 View  Result Date: 11/08/2022 CLINICAL DATA:  Cough. Positive blood cultures. History of strep throat and pneumonia on antibiotics. EXAM: PORTABLE CHEST 1 VIEW COMPARISON:  11/07/2022 FINDINGS: Postoperative fixation of the thoracic spine. Cardiac enlargement. Atelectasis or infiltration suggested in the left lung base. Small pleural effusions bilaterally. No pneumothorax. Mediastinal contours appear intact. Calcification of the aorta. IMPRESSION: Infiltration or atelectasis in the left base is similar to prior study. Small bilateral pleural effusions. Electronically Signed   By: Burman Nieves M.D.   On: 11/08/2022 16:36  DG Chest 2 View  Result Date: 11/07/2022 CLINICAL DATA:  Cough EXAM: CHEST - 2 VIEW COMPARISON:  Previous studies including the examination of 10/12/2022 FINDINGS: Transverse diameter of heart is increased. Apparent shift of mediastinum to the left may be due to rotation. Increased markings are [name] in medial left lower lung field. There is blunting of both lateral CP angles. There is no pneumothorax. There is surgical fusion in thoracic spine. There is deformity in the right clavicle from previous injury. Medial end of right clavicle is not [name]. IMPRESSION: Increased markings are [name] in medial left lower lung fields suggesting atelectasis/pneumonia. There is  blunting of both lateral CP angles suggesting small bilateral pleural effusions. Electronically Signed   By: Ernie Avena M.D.   On: 11/07/2022 18:50    Scheduled Meds:  amLODipine  5 mg Oral Q M,W,F   And   amLODipine  10 mg Oral Once per day on Sun Tue Thu Sat   [START ON 11/11/2022] buprenorphine  1 patch Transdermal Weekly   carvedilol  6.25 mg Oral BID WC   docusate sodium  100 mg Oral QHS   enoxaparin (LOVENOX) injection  30 mg Subcutaneous Q24H   furosemide  20 mg Oral Daily   irbesartan  150 mg Oral QPM   levETIRAcetam  1,000 mg Oral Daily   oxybutynin  5 mg Oral Daily   pantoprazole  40 mg Oral Daily  Continuous Infusions:  [START ON 11/11/2022] vancomycin      LOS: 1 day   Shon Hale M.D on 11/09/2022 at 1:45 PM  Go to [email or web address] - for contact info  [facility] - Office  [phone]  If 7PM-7AM, please contact night-coverage [email or web address] 11/09/2022, 1:45 PM

## 2022-11-09 NOTE — TOC Initial Note (Signed)
Transition of Care [facility]) - Initial/Assessment Note    Patient Details  Name: [name] MRN: [id number] Date of Birth: 30-Jul-1965  Transition of Care (TOC) CM/SW Contact:    Catalina Gravel, LCSW Phone Number: 11/09/2022, 5:27 PM  Clinical Narrative:                 CSW assessed pt.due to  readmission risk.  Pt lives with daughter and shared that daughter is also a Engineer, civil (consulting) to her.  Family resides in a single home.  Pt describes doing her bathing and self care and daughter cooks for her. Pt takes the medical van [facility] for appointments and does not drive. Pt a HH wound care nurse with [facility]  and another visitor related to health care but she was not certain of the agency.   PT has a hospital bed, wc. Pt able to access shower in her WC. Pt does not report any additional DME needs at this time.  TOC to continue to follow.    Expected Discharge Plan: Home/Self Care Barriers to Discharge: Continued Medical Work up   Patient Goals and CMS Choice Patient states their goals for this hospitalization and ongoing recovery are:: Return home with daughter, who serves as her nurse as well per pt.          Expected Discharge Plan and Services In-house Referral: Clinical Social Work     Living arrangements for the past 2 months: Single Family Home                                      Prior Living Arrangements/Services Living arrangements for the past 2 months: Single Family Home Lives with:: Adult Children   Do you feel safe going back to the place where you live?: Yes      Need for Family Participation in Patient Care: Yes (Comment) Care giver support system in place?: Yes (comment) Current home services: Home RN (Wound care Nurse visits) Criminal Activity/Legal Involvement Pertinent to Current Situation/Hospitalization: No - Comment as needed  Activities of Daily Living Home Assistive Devices/Equipment: Wheelchair ADL Screening (condition at time of  admission) Patient's cognitive ability adequate to safely complete daily activities?: Yes Is the patient deaf or have difficulty hearing?: No Does the patient have difficulty seeing, even when wearing glasses/contacts?: No Does the patient have difficulty concentrating, remembering, or making decisions?: No Patient able to express need for assistance with ADLs?: Yes Does the patient have difficulty dressing or bathing?: Yes Independently performs ADLs?: No Communication: Independent Dressing (OT): Needs assistance Is this a change from baseline?: Pre-admission baseline Grooming: Independent Feeding: Independent Bathing: Needs assistance Is this a change from baseline?: Pre-admission baseline Toileting: Needs assistance Is this a change from baseline?: Pre-admission baseline In/Out Bed: Needs assistance Is this a change from baseline?: Pre-admission baseline Walks in Home: Dependent Is this a change from baseline?: Pre-admission baseline Does the patient have difficulty walking or climbing stairs?: Yes Weakness of Legs: Both Weakness of Arms/Hands: None  Permission Sought/Granted                  Emotional Assessment Appearance:: Appears stated age Attitude/Demeanor/Rapport: Gracious Affect (typically observed): Appropriate Orientation: : Oriented to Self, Oriented to Place, Oriented to  Time, Oriented to Situation      Admission diagnosis:  Bacteremia [R78.81] Patient Active Problem List   Diagnosis Date Noted   Bacteremia 11/08/2022   Loss of  weight 10/26/2022   N&V (nausea and vomiting) 10/26/2022   Cellulitis of knee, left 07/24/2022   Infected decubitus ulcer 07/23/2022   Osteomyelitis of multiple sites-chronic     Chronic anemia 02/25/2022   ESRD (end stage renal disease) (HCC) 02/25/2022   Abscess of buttock, right 08/25/2021   GERD (gastroesophageal reflux disease) 08/25/2021   Anxiety 08/25/2021   Decubitus ulcers 08/25/2021   Anemia 08/24/2021    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 chronic kidney disease (HCC) 04/25/2017   AKI (acute kidney injury) (HCC) 04/25/2017   Dehydration 04/25/2017   Metabolic acidosis 04/25/2017   Altered mental status 06/22/2016   Altered mental state 08/16/2015   Pressure ulcer 05/16/2015   Acute encephalopathy 05/15/2015   Malnutrition (HCC)    Encephalopathy acute 10/10/2014   Sepsis (HCC) 10/10/2014   Left lower lobe pneumonia 10/10/2014   Encephalomalacia 10/10/2014   TBI (traumatic brain injury) (HCC) 10/10/2014   Severe protein-calorie malnutrition (HCC) 10/10/2014   UTI (urinary tract infection) 08/26/2014   Hypokalemia 08/26/2014   Abnormal uterine bleeding (AUB) 06/15/2014   Essential hypertension    Seizure disorder (HCC)    Status epilepticus (HCC) 05/02/2014   Hypertensive emergency without congestive heart failure 05/02/2014   Acute renal failure (HCC) 05/21/2013   Hyperkalemia 05/21/2013   Hypothermia 05/21/2013   Paraplegia (HCC) 06/25/2012   Suprapubic catheter (HCC) 06/25/2012   Decubitus ulcer of buttock 06/25/2012   PCP:  Richarda Osmond, FNP Pharmacy:   [facility] [place], [place] - [address] [place] [address] Phone: [phone] Fax: [phone]     Social Determinants of Health (SDOH) Social History: SDOH Screenings   Food Insecurity: No Food Insecurity (11/08/2022)  Housing: Low Risk  (11/08/2022)  Transportation Needs: No Transportation Needs (11/08/2022)  Utilities: Not At Risk (11/09/2022)  Tobacco Use: Low Risk  (11/08/2022)   SDOH Interventions:     Readmission Risk Interventions    11/09/2022    5:22 PM  Readmission Risk Prevention Plan  Transportation Screening Complete  Medication Review (RN Care Manager) Complete  PCP or Specialist appointment within 3-5 days of discharge Complete  HRI or Home Care Consult Complete  SW Recovery Care/Counseling Consult Complete  Palliative Care Screening Not Applicable  Skilled  Nursing Facility Patient Refused

## 2022-11-09 NOTE — Progress Notes (Signed)
  Echocardiogram 2D Echocardiogram has been performed.  Janalyn Harder 11/09/2022, 2:24 PM

## 2022-11-10 ENCOUNTER — Inpatient Hospital Stay (HOSPITAL_COMMUNITY): Payer: 59

## 2022-11-10 DIAGNOSIS — G8220 Paraplegia, unspecified: Secondary | ICD-10-CM

## 2022-11-10 DIAGNOSIS — N186 End stage renal disease: Secondary | ICD-10-CM | POA: Diagnosis not present

## 2022-11-10 DIAGNOSIS — R7881 Bacteremia: Secondary | ICD-10-CM | POA: Diagnosis not present

## 2022-11-10 DIAGNOSIS — Z9359 Other cystostomy status: Secondary | ICD-10-CM

## 2022-11-10 LAB — CULTURE, BLOOD (ROUTINE X 2)
Culture: NO GROWTH
Special Requests: ADEQUATE
Special Requests: ADEQUATE

## 2022-11-10 LAB — URINE CULTURE: Culture: >=100000 — AB

## 2022-11-10 MED ORDER — LEVETIRACETAM 500 MG PO TABS
500.0000 mg | ORAL_TABLET | ORAL | Status: DC
  Administered 2022-11-11: 500 mg via ORAL
  Filled 2022-11-10: qty 1

## 2022-11-10 MED ORDER — IOHEXOL 9 MG/ML PO SOLN
ORAL | Status: AC
  Filled 2022-11-10: qty 1000

## 2022-11-10 MED ORDER — IOHEXOL 9 MG/ML PO SOLN
500.0000 mL | ORAL | Status: AC
  Administered 2022-11-10 (×2): 500 mL via ORAL

## 2022-11-10 MED ORDER — IOHEXOL 300 MG/ML  SOLN
100.0000 mL | Freq: Once | INTRAMUSCULAR | Status: AC | PRN
  Administered 2022-11-10: 100 mL via INTRAVENOUS

## 2022-11-10 NOTE — Progress Notes (Addendum)
Id brief note   Afebrile/hds    Initial bcx group c strep, CoNS, e faecalis ?intraabd abscess vs sacral wound translocation; the last source is most likely given the bug spectrum    -abd pelv ct with contrast; contrast load can be dialyzed tomorrow -I have discussed with dr Mariea Clonts to ask renal to see if dialysis can be coordinated in setting contrast load -continue current vanc until the CoNS can be worked up for sensitivity -I have asked micro today to workup the staph hemolyticus -wbc down, will defer adding other gram negative coverage back as per primary team the wound don't look infected

## 2022-11-10 NOTE — Progress Notes (Signed)
Patient repositioned in bed per order. Tolerated medications whole, patient continues to have dry productive cough,, patient given PRN, see MAR. Patient verbalized no complaints pain or discomfort.

## 2022-11-10 NOTE — Progress Notes (Signed)
Patient slept most of the night. 2 prn medications given during this shift. Dressing change completed this am. Plan of care ongoing.
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
## 2022-11-11 ENCOUNTER — Inpatient Hospital Stay (HOSPITAL_COMMUNITY): Payer: 59 | Admitting: Anesthesiology

## 2022-11-11 ENCOUNTER — Encounter (HOSPITAL_COMMUNITY): Payer: Self-pay | Admitting: Anesthesiology

## 2022-11-11 ENCOUNTER — Telehealth (HOSPITAL_BASED_OUTPATIENT_CLINIC_OR_DEPARTMENT_OTHER): Payer: Self-pay | Admitting: *Deleted

## 2022-11-11 ENCOUNTER — Inpatient Hospital Stay (HOSPITAL_COMMUNITY): Payer: 59

## 2022-11-11 ENCOUNTER — Encounter (HOSPITAL_COMMUNITY)
Admission: EM | Disposition: A | Discharge status: Home Health | Payer: Self-pay | Source: Home / Self Care | Attending: Family Medicine

## 2022-11-11 DIAGNOSIS — J189 Pneumonia, unspecified organism: Secondary | ICD-10-CM

## 2022-11-11 DIAGNOSIS — R7881 Bacteremia: Secondary | ICD-10-CM

## 2022-11-11 DIAGNOSIS — I3139 Other pericardial effusion (noninflammatory): Secondary | ICD-10-CM | POA: Diagnosis not present

## 2022-11-11 DIAGNOSIS — N186 End stage renal disease: Secondary | ICD-10-CM | POA: Diagnosis not present

## 2022-11-11 DIAGNOSIS — I10 Essential (primary) hypertension: Secondary | ICD-10-CM | POA: Diagnosis not present

## 2022-11-11 DIAGNOSIS — F419 Anxiety disorder, unspecified: Secondary | ICD-10-CM

## 2022-11-11 DIAGNOSIS — Z9359 Other cystostomy status: Secondary | ICD-10-CM | POA: Diagnosis not present

## 2022-11-11 HISTORY — PX: TEE WITHOUT CARDIOVERSION: SHX5443

## 2022-11-11 LAB — CBC
HCT: 25.5 % — ABNORMAL LOW (ref 36.0–46.0)
Hemoglobin: 7.3 g/dL — ABNORMAL LOW (ref 12.0–15.0)
MCH: 26.2 pg (ref 26.0–34.0)
MCHC: 28.6 g/dL — ABNORMAL LOW (ref 30.0–36.0)
MCV: 91.4 fL (ref 80.0–100.0)
Platelets: 380 10*3/uL (ref 150–400)
RBC: 2.79 MIL/uL — ABNORMAL LOW (ref 3.87–5.11)
RDW: 18.5 % — ABNORMAL HIGH (ref 11.5–15.5)
WBC: 7.1 10*3/uL (ref 4.0–10.5)
nRBC: 0 % (ref 0.0–0.2)

## 2022-11-11 LAB — GLUCOSE, CAPILLARY
Glucose-Capillary: 103 mg/dL — ABNORMAL HIGH (ref 70–99)
Glucose-Capillary: 77 mg/dL (ref 70–99)
Glucose-Capillary: 81 mg/dL (ref 70–99)

## 2022-11-11 LAB — RENAL FUNCTION PANEL
Albumin: 2.1 g/dL — ABNORMAL LOW (ref 3.5–5.0)
Anion gap: 10 (ref 5–15)
BUN: 39 mg/dL — ABNORMAL HIGH (ref 6–20)
CO2: 22 mmol/L (ref 22–32)
Calcium: 8.2 mg/dL — ABNORMAL LOW (ref 8.9–10.3)
Chloride: 96 mmol/L — ABNORMAL LOW (ref 98–111)
Creatinine, Ser: 5.12 mg/dL — ABNORMAL HIGH (ref 0.44–1.00)
GFR, Estimated: 9 mL/min — ABNORMAL LOW (ref >60)
Glucose, Bld: 80 mg/dL (ref 70–99)
Phosphorus: 4.8 mg/dL — ABNORMAL HIGH (ref 2.5–4.6)
Potassium: 4.7 mmol/L (ref 3.5–5.1)
Sodium: 128 mmol/L — ABNORMAL LOW (ref 135–145)

## 2022-11-11 LAB — CULTURE, BLOOD (ROUTINE X 2): Culture: NO GROWTH

## 2022-11-11 LAB — HEPATITIS B SURFACE ANTIGEN: Hepatitis B Surface Ag: NONREACTIVE

## 2022-11-11 SURGERY — ECHOCARDIOGRAM, TRANSESOPHAGEAL
Anesthesia: General

## 2022-11-11 MED ORDER — PROPOFOL 10 MG/ML IV BOLUS
INTRAVENOUS | Status: AC
  Filled 2022-11-11: qty 20

## 2022-11-11 MED ORDER — PROPOFOL 10 MG/ML IV BOLUS
INTRAVENOUS | Status: DC | PRN
  Administered 2022-11-11 (×2): 50 mg via INTRAVENOUS
  Administered 2022-11-11: 30 mg via INTRAVENOUS

## 2022-11-11 MED ORDER — CHLORHEXIDINE GLUCONATE CLOTH 2 % EX PADS
6.0000 | MEDICATED_PAD | Freq: Every day | CUTANEOUS | Status: DC
  Administered 2022-11-11 – 2022-11-12 (×2): 6 via TOPICAL

## 2022-11-11 MED ORDER — ORAL CARE MOUTH RINSE: 15.0000 mL | Freq: Once | OROMUCOSAL | Status: DC

## 2022-11-11 MED ORDER — CALCITRIOL 0.25 MCG PO CAPS
0.7500 ug | ORAL_CAPSULE | ORAL | Status: DC
  Administered 2022-11-11: 0.75 ug via ORAL
  Filled 2022-11-11: qty 3

## 2022-11-11 MED ORDER — PENTAFLUOROPROP-TETRAFLUOROETH EX AERO: 1.0000 | INHALATION_SPRAY | CUTANEOUS | Status: DC | PRN

## 2022-11-11 MED ORDER — SODIUM CHLORIDE 0.9 % IV SOLN: INTRAVENOUS | Status: DC

## 2022-11-11 MED ORDER — BUTAMBEN-TETRACAINE-BENZOCAINE 2-2-14 % EX AERO
INHALATION_SPRAY | CUTANEOUS | Status: AC
  Filled 2022-11-11: qty 5

## 2022-11-11 MED ORDER — SODIUM CHLORIDE 0.9 % IV SOLN: INTRAVENOUS | Status: DC | PRN

## 2022-11-11 MED ORDER — DARBEPOETIN ALFA 200 MCG/0.4ML IJ SOSY
200.0000 ug | PREFILLED_SYRINGE | INTRAMUSCULAR | Status: DC
  Administered 2022-11-11: 200 ug via SUBCUTANEOUS
  Filled 2022-11-11: qty 0.4

## 2022-11-11 MED ORDER — CHLORHEXIDINE GLUCONATE 0.12 % MT SOLN
15.0000 mL | Freq: Once | OROMUCOSAL | Status: DC
  Administered 2022-11-11: 15 mL via OROMUCOSAL

## 2022-11-11 MED ORDER — LIDOCAINE HCL 1 % IJ SOLN
INTRAMUSCULAR | Status: DC | PRN
  Administered 2022-11-11: 50 mg via INTRADERMAL

## 2022-11-11 MED ORDER — LIDOCAINE HCL (PF) 1 % IJ SOLN: 5.0000 mL | INTRAMUSCULAR | Status: DC | PRN

## 2022-11-11 MED ORDER — LIDOCAINE-PRILOCAINE 2.5-2.5 % EX CREA: 1.0000 | TOPICAL_CREAM | CUTANEOUS | Status: DC | PRN

## 2022-11-11 NOTE — Progress Notes (Signed)
*  PRELIMINARY RESULTS* Echocardiogram Echocardiogram Transesophageal has been performed.  Stacey Drain 11/11/2022, 12:45 PM

## 2022-11-11 NOTE — Anesthesia Postprocedure Evaluation (Signed)
Anesthesia Post Note  Patient: Elleni Chiarenza  Procedure(s) Performed: TRANSESOPHAGEAL ECHOCARDIOGRAM (TEE)  Anesthetic complications: no   No notable events documented.   Last Vitals:  Vitals:   11/10/22 2029 11/11/22 0527  BP: (!) 159/68 (!) 169/73  Pulse: 83 79  Resp: 18   Temp: 36.8 C 36.7 C  SpO2: 97% 98%    Last Pain:  Vitals:   11/11/22 0527  TempSrc: Oral  PainSc:                  Glynn Octave

## 2022-11-11 NOTE — Transfer of Care (Signed)
Immediate Anesthesia Transfer of Care Note  Patient: [name]  Procedure(s) Performed: TRANSESOPHAGEAL ECHOCARDIOGRAM (TEE)  Patient Location: PACU  Anesthesia Type:General  Level of Consciousness: awake  Airway & Oxygen Therapy: Patient Spontanous Breathing  Post-op Assessment: Report given to RN  Post vital signs: Reviewed and stable  Last Vitals:  Vitals Value Taken Time  BP    Temp    Pulse    Resp    SpO2      Last Pain:  Vitals:   11/11/22 0527  TempSrc: Oral  PainSc:          Complications: No notable events documented.

## 2022-11-11 NOTE — Progress Notes (Signed)
   11/11/22 0406  Complete When Order for Surgery Written (All Areas)  ID Verified and ID Band Applied Yes  Ordered ERAS meds (Tylenol, Celebrex, Gabapentin) given? None ordered  Was patient on a Beta Blocker prior to hospital arrival? Yes  Last dose documented: On MAR  Does the patient have diabetes? No diagnosis of diabetes  Complete When Order for Surgery Written:  Pre-op Surgical PCR Testing (All Areas )  PCR Swab Performed No  Reason not swabbed Not applicable  Complete Within 24 Hours of Surgery (All Areas)  CHG  Bath Completed Yes   CHG Cloths/Bath/Shower Completed 1 night prior  For Patients with FOLEY CATHETERS:  Complete Within One Hour of Surgery (All Areas)  Hygiene Foley care AND Peri care  NPO (All Areas)  NPO past Midnight Yes  Date of last liquid 11/10/22  Time of last liquid 2330  Date of last solid 11/10/22  Patient belongings (All Areas)  Patient/Family advised about valuables policy? Yes  Home Medications Sent to pharmacy in secure med bag  Patient Belongings Kept at bedside  Belongings at Bedside Glasses;Clothing;Electronic device(s);Assistive device  Bedside: Electronic Device(s) Cellphone  BedsideEducation officer, museum

## 2022-11-11 NOTE — Telephone Encounter (Signed)
Post ED Visit - Positive Culture Follow-up  Culture report reviewed by antimicrobial stewardship pharmacist: [facility] []  Andreas Ohm, Pharm.D. []  Celedonio Miyamoto, Pharm.D., BCPS AQ-ID []  Garvin Fila, Pharm.D., BCPS []  Georgina Pillion, [address].D., BCPS []  [place]ess[place], [address].D., BCPS, AAHIVP []  Estella Husk, Pharm.D., BCPS, AAHIVP []  Lysle Pearl, PharmD, BCPS []  Phillips Climes, PharmD, BCPS []  Agapito Games, PharmD, BCPS []  Verlan Friends, PharmD []  Mervyn Gay, PharmD, BCPS []  Vinnie Level, PharmD  [facility] []  Len Childs, PharmD []  Greer Pickerel, PharmD []  Adalberto Cole, PharmD []  Perlie Gold, Rph []  Lonell Face) Jean Rosenthal, PharmD []  Earl Many, PharmD []  Junita Push, PharmD []  Dorna Leitz, PharmD []  Terrilee Files, PharmD []  Lynann Beaver, PharmD []  Keturah Barre, PharmD []  Loralee Pacas, PharmD []  Bernadene Person, PharmD   Positive blood culture Pt returned to hospital for IV antibiotics and no further patient follow-up is required at this time.  [facility] 11/11/2022, 8:29 AM

## 2022-11-11 NOTE — Progress Notes (Signed)
    PROCEDURE:  TEE  Indications:  Bacteremia  Risks/benefits described   Pt understands and agreed to proceed  Patient sedated by anesthesia with Propofol IV Throat numbed with Cetacaine spray Mouth guard placed  TEE probe advanced to mid esophagus without difficulty   Findings:  TV mildly thickened.  No vegetation   Trace TR AV normal   No AI    MV normal   Trace MR PV normal   Trace PI  LVEF and RVEF normal LA, LAA without masses  Small PFO as tested with injection of agitated saline with appearance in L sided chambers   Small pericardial effsuion  Minimal plaquing in the thoracic aorta   Procedure was without complications   Full report to follow     Signed, Dietrich Pates, MD  11/11/2022 1:05 PM

## 2022-11-11 NOTE — Progress Notes (Signed)
   HEMODIALYSIS TREATMENT NOTE:   Uneventful 3 hour heparin-free treatment completed using left upper arm AVF (15g/antegrade). Goal met: 2 liters removed without interruption in UF.  All blood was returned and hemostasis was achieved in 10 minutes.  Post-HD:  11/11/22 1920  Vitals  Temp 98 F (36.7 C)  Temp Source Oral  BP (!) 160/81  MAP (mmHg) 99  BP Location Right Wrist  BP Method Automatic  Patient Position (if appropriate) Lying  Pulse Rate 74  Pulse Rate Source Monitor  ECG Heart Rate 73  Resp 18  Oxygen Therapy  SpO2 99 %  O2 Device Room Air  Post Treatment  Dialyzer Clearance Lightly streaked  Duration of HD Treatment -hour(s) 3 hour(s)  Hemodialysis Intake (mL) 0 mL  Liters Processed 63.2  Fluid Removed (mL) 2000 mL  Tolerated HD Treatment Yes  Post-Hemodialysis Comments Goal met  AVG/AVF Arterial Site Held (minutes) 5 minutes  AVG/AVF Venous Site Held (minutes) 5 minutes  Fistula / Graft Left Upper arm Arteriovenous fistula  No placement date or time found.   Placed prior to admission: Yes  Orientation: Left  Access Location: Upper arm  Access Type: Arteriovenous fistula  Site Condition No complications  Fistula / Graft Assessment Thrill;Bruit  Status Patent   Pt was transported from [facility] back to [facility] where Vancomycin administration was started via PIV. Bedside hand-off was given to [facility], LPN  Arman Filter, RN [facility]

## 2022-11-11 NOTE — Consult Note (Signed)
Reason for Consult: To manage dialysis and dialysis related needs Referring Physician: Abigel Karst is an 57 y.o. female with paraplegia since 1999- chronic suprapubic catheter ,  history of uterine CA, sacral decub and ESRD.  She is admitted for bacteremia felt to be due to her decub-  ID following.  She admitted to fever PTA but otherwise feels well. We are asked to provide her routine HD    Dialyzes at [facility] MWF  3 hours  EDW 45.5. HD Bath 3K/2.5 calc, Dialyzer unknown, Heparin none. Access 15 gauge.400/500  200 mircera, 0.75 calcitriol   Past Medical History:  Diagnosis Date   Abnormal uterine bleeding (AUB) 06/15/2014   Cancer (HCC)    uterine   High blood pressure    Paraplegia (lower)    Seizure disorder (HCC)    Seizures (HCC)    Suprapubic catheter (HCC)    Urinary tract infection     Past Surgical History:  Procedure Laterality Date   APPLICATION OF WOUND VAC Right 09/13/2021   (approximately 1-74mos ago) pressure sore on right hip   BACK SURGERY     Pt stated "before 2000"   ESOPHAGOGASTRODUODENOSCOPY N/A 09/20/2015   Procedure: ESOPHAGOGASTRODUODENOSCOPY (EGD);  Surgeon: Malissa Hippo, MD;  [facility];  Service: Endoscopy;  Laterality: N/A;  730   IR CATHETER TUBE CHANGE  04/02/2018   PERCUTANEOUS ENDOSCOPIC GASTROSTOMY (PEG) REMOVAL N/A 09/20/2015   Procedure: PERCUTANEOUS ENDOSCOPIC GASTROSTOMY (PEG) REMOVAL;  Surgeon: Malissa Hippo, MD;  Location: [facility];  Service: Endoscopy;  Laterality: N/A;    Family History  Problem Relation Age of Onset   Cancer Mother    Hypertension Mother    Cancer Sister        breast and then spread everywhere.   Diabetes Paternal Grandmother    Hypertension Paternal Grandmother     Social History:  reports that she has never smoked. She has never used smokeless tobacco. She reports current alcohol use. She reports that she does not use drugs.  Allergies:  Allergies  Allergen  Reactions   Benadryl [Diphenhydramine Hcl (Sleep)] Hives   Quinine Derivatives Other (See Comments)    Alters mental status   Vancomycin     Pt is tolerating this medication at HD   Azithromycin Itching and Rash   Tetracycline Itching    Able to tolerate Doxycycline.    Zosyn [Piperacillin Sod-Tazobactam So] Rash    Medications: I have reviewed the patient's current medications.  Results for orders placed or performed during the hospital encounter of 11/08/22 (from the past 48 hour(s))  Glucose, capillary     Status: Abnormal   Collection Time: 11/11/22 12:29 AM  Result Value Ref Range   Glucose-Capillary 103 (H) 70 - 99 mg/dL    Comment: Glucose reference range applies only to samples taken after fasting for at least 8 hours.  CBC     Status: Abnormal   Collection Time: 11/11/22  5:14 AM  Result Value Ref Range   WBC 7.1 4.0 - 10.5 K/uL   RBC 2.79 (L) 3.87 - 5.11 MIL/uL   Hemoglobin 7.3 (L) 12.0 - 15.0 g/dL   HCT 16.1 (L) 09.6 - 04.5 %   MCV 91.4 80.0 - 100.0 fL   MCH 26.2 26.0 - 34.0 pg   MCHC 28.6 (L) 30.0 - 36.0 g/dL   RDW 40.9 (H) 81.1 - 91.4 %   Platelets 380 150 - 400 K/uL   nRBC 0.0 0.0 - 0.2 %  Comment: Performed at [facility], [address] [place], [place] [address]  Renal function panel     Status: Abnormal   Collection Time: 11/11/22  5:14 AM  Result Value Ref Range   Sodium 128 (L) 135 - 145 mmol/L   Potassium 4.7 3.5 - 5.1 mmol/L   Chloride 96 (L) 98 - 111 mmol/L   CO2 22 22 - 32 mmol/L   Glucose, Bld 80 70 - 99 mg/dL    Comment: Glucose reference range applies only to samples taken after fasting for at least 8 hours.   BUN 39 (H) 6 - 20 mg/dL   Creatinine, Ser 1.91 (H) 0.44 - 1.00 mg/dL   Calcium 8.2 (L) 8.9 - 10.3 mg/dL   Phosphorus 4.8 (H) 2.5 - 4.6 mg/dL   Albumin 2.1 (L) 3.5 - 5.0 g/dL   GFR, Estimated 9 (L) >60 mL/min    Comment: (NOTE) Calculated using the CKD-EPI Creatinine Equation (2021)    Anion gap 10 5 - 15    Comment: Performed at  [facility], [address], [place], [place] [address]  Glucose, capillary     Status: None   Collection Time: 11/11/22  5:44 AM  Result Value Ref Range   Glucose-Capillary 77 70 - 99 mg/dL    Comment: Glucose reference range applies only to samples taken after fasting for at least 8 hours.    CT ABDOMEN PELVIS W CONTRAST  Result Date: 11/10/2022 CLINICAL DATA:  Concern for abscess. EXAM: CT ABDOMEN AND PELVIS WITH CONTRAST TECHNIQUE: Multidetector CT imaging of the abdomen and pelvis was performed using the standard protocol following bolus administration of intravenous contrast. RADIATION DOSE REDUCTION: This exam was performed according to the departmental dose-optimization program which includes automated exposure control, adjustment of the mA and/or kV according to patient size and/or use of iterative reconstruction technique. CONTRAST:  OMNIPAQUE IOHEXOL 300 MG/ML  SOLN COMPARISON:  CT abdomen pelvis dated 07/23/2022. FINDINGS: Lower chest: Bibasilar subsegmental atelectasis. Stable mild cardiomegaly. No intra-abdominal free air or free fluid. Hepatobiliary: The liver is unremarkable. No biliary dilatation. The gallbladder is unremarkable. Pancreas: Unremarkable. No pancreatic ductal dilatation or surrounding inflammatory changes. Spleen: Normal in size without focal abnormality. Adrenals/Urinary Tract: The adrenal glands unremarkable. Atrophic kidneys. There is no hydronephrosis on either side. Subcentimeter bilateral renal hypodense lesions are too small to characterize. The urinary bladder is decompressed around a suprapubic catheter. Stomach/Bowel: There is no bowel obstruction or active inflammation. The appendix is normal. Vascular/Lymphatic: Mild aortoiliac atherosclerotic disease. The IVC is unremarkable. No portal venous gas. There is no adenopathy. Reproductive: The uterus is retroflexed.  No adnexal masses. Other: Complex subcutaneous collection superficial to the coccyx as seen  previously. Musculoskeletal: There is decubitus ulcer over the greater trochanter of the left femur. No drainable fluid collection or abscess. No acute osseous pathology. IMPRESSION: 1. No acute intra-abdominal or pelvic pathology. No intra-abdominal abscess. 2.  Aortic Atherosclerosis (ICD10-I70.0). Electronically Signed   By: Elgie Collard M.D.   On: 11/10/2022 17:54   CT CHEST WO CONTRAST  Result Date: 11/09/2022 CLINICAL DATA:  Chronic cough EXAM: CT CHEST WITHOUT CONTRAST TECHNIQUE: Multidetector CT imaging of the chest was performed following the standard protocol without IV contrast. RADIATION DOSE REDUCTION: This exam was performed according to the departmental dose-optimization program which includes automated exposure control, adjustment of the mA and/or kV according to patient size and/or use of iterative reconstruction technique. COMPARISON:  10/21/2015 FINDINGS: Cardiovascular: Cardiomegaly with left-sided enlargement. Small pericardial effusion. Dilated central  pulmonary arteries suggesting pulmonary hypertension. Scattered calcified aortic plaque without aneurysm. Mediastinum/Nodes: No mass or adenopathy. Lungs/Pleura: Dependent atelectasis/consolidation posteriorly in the lower lobes. Ground-glass opacities in the posterior segment left upper lobe and posterior lingula, new since previous, infectious versus atelectatic in origin. No worrisome nodule or suggestion of septic emboli. Upper Abdomen: Bilateral renal parenchymal loss.  No acute findings. Musculoskeletal: Post posterior thoracic instrumented fusion T3-T10 with chronic grade 2 anterolisthesis T6-7. 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 Old healed sternal fracture deformity. Old right clavicle fracture deformity. IMPRESSION: 1. Dependent atelectasis/consolidation posteriorly in the lower lobes. 2. Ground-glass opacities in the posterior segment left upper lobe and posterior lingula, infectious versus atelectatic in origin. 3.  Cardiomegaly with left-sided enlargement and small pericardial effusion. 4. Dilated central pulmonary arteries suggesting pulmonary hypertension. 5.  Aortic Atherosclerosis (ICD10-I70.0). Electronically Signed   By: Corlis Leak M.D.   On: 11/09/2022 16:05   ECHOCARDIOGRAM COMPLETE  Result Date: 11/09/2022    ECHOCARDIOGRAM REPORT   Patient Name:   [name] Date of Exam: 11/09/2022 Medical Rec #:  [id number]        Height:       65.0 in Accession #:    [id number]       Weight:       130.0 lb Date of Birth:  1965-08-21         BSA:          1.647 m Patient Age:    56 years         BP:           145/63 mmHg Patient Gender: F                HR:           85 bpm. [name] Procedure: 2D Echo, Cardiac Doppler and Color Doppler Indications:    Bacteremia  History:        Patient has prior history of Echocardiogram examinations, most                 recent 05/04/2014. Signs/Symptoms:Bacteremia, Altered Mental                 Status and Fever; Risk Factors:Hypertension. ESRD. Cancer.  Sonographer:    Sheralyn Boatman RDCS Referring Phys: [id number] COURAGE EMOKPAE IMPRESSIONS  1. Left ventricular ejection fraction, by estimation, is 50 to 55%. The left ventricle has low normal function. The left ventricle has no regional wall motion abnormalities. There is mild left ventricular hypertrophy. Left ventricular diastolic parameters are consistent with Grade II diastolic dysfunction (pseudonormalization).  2. Right ventricular systolic function is normal. The right ventricular size is normal.  3. Left atrial size was mild to moderately dilated.  4. A small pericardial effusion is present. The pericardial effusion is posterior to the left ventricle and the left atrium. There is no evidence of cardiac tamponade.  5. The mitral valve is normal in structure. No evidence of mitral valve regurgitation.  6. The aortic valve is normal in structure. Aortic valve regurgitation is not visualized.  7. The inferior vena cava is  normal in size with greater than 50% respiratory variability, suggesting right atrial pressure of 3 mmHg. FINDINGS  Left Ventricle: Left ventricular ejection fraction, by estimation, is 50 to 55%. The left ventricle has low normal function. The left ventricle has no regional wall motion abnormalities. The left ventricular internal cavity size was normal in size. There is mild left ventricular hypertrophy. Left ventricular diastolic parameters  are consistent with Grade II diastolic dysfunction (pseudonormalization). Right Ventricle: The right ventricular size is normal. Right ventricular systolic function is normal. Left Atrium: Left atrial size was mild to moderately dilated. Right Atrium: Right atrial size was normal in size. Pericardium: A small pericardial effusion is present. The pericardial effusion is posterior to the left ventricle and the left atrium. There is no evidence of cardiac tamponade. Mitral Valve: The mitral valve is normal in structure. No evidence of mitral valve regurgitation. Tricuspid Valve: Tricuspid valve regurgitation is mild. Aortic Valve: The aortic valve is normal in structure. Aortic valve regurgitation is not visualized. Pulmonic Valve: Pulmonic valve regurgitation is trivial. Aorta: The aortic root and ascending aorta are structurally normal, with no evidence of dilitation. Venous: The inferior vena cava is normal in size with greater than 50% respiratory variability, suggesting right atrial pressure of 3 mmHg. IAS/Shunts: No atrial level shunt detected by color flow Doppler.  LEFT VENTRICLE PLAX 2D LVIDd:         4.90 cm      Diastology LVIDs:         3.50 cm      LV e' medial:    5.98 cm/s LV PW:         2.10 cm      LV E/e' medial:  23.2 LV IVS:        1.20 cm      LV e' lateral:   8.81 cm/s LVOT diam:     2.25 cm      LV E/e' lateral: 15.8 LV SV:         91 LV SV Index:   55 LVOT Area:     3.98 cm  LV Volumes (MOD) LV vol d, MOD A2C: 146.0 ml LV vol d, MOD A4C: 121.0 ml LV vol s,  MOD A2C: 59.2 ml LV vol s, MOD A4C: 50.1 ml LV SV MOD A2C:     86.8 ml LV SV MOD A4C:     121.0 ml LV SV MOD BP:      82.2 ml RIGHT VENTRICLE             IVC RV S prime:     13.60 cm/s  IVC diam: 1.60 cm TAPSE (M-mode): 2.6 cm LEFT ATRIUM             Index        RIGHT ATRIUM           Index LA diam:        2.20 cm 1.34 cm/m   RA Area:     11.10 cm LA Vol (A2C):   63.2 ml 38.37 ml/m  RA Volume:   22.80 ml  13.84 ml/m LA Vol (A4C):   55.3 ml 33.57 ml/m LA Biplane Vol: 65.4 ml 39.70 ml/m  AORTIC VALVE             PULMONIC VALVE LVOT Vmax:   129.00 cm/s PR End Diast Vel: 1.71 msec LVOT Vmean:  82.500 cm/s LVOT VTI:    0.229 m  AORTA Ao Root diam: 3.80 cm Ao Asc diam:  3.60 cm MITRAL VALVE                TRICUSPID VALVE MV Area (PHT): 6.54 cm     TR Peak grad:   21.5 mmHg MV Decel Time: 116 msec     TR Vmax:        232.00 cm/s MV E velocity: 139.00 cm/s MV A velocity: 79.50  cm/s   SHUNTS MV E/A ratio:  1.75         Systemic VTI:  0.23 m                             Systemic Diam: 2.25 cm Carolan Clines Electronically signed by Carolan Clines Signature Date/Time: 11/09/2022/3:56:19 PM    Final    DG Ankle 2 Views Right  Result Date: 11/09/2022 CLINICAL DATA:  [id number] Pain [id number] EXAM: RIGHT ANKLE - 2 VIEW COMPARISON:  None Available. FINDINGS: Severe osteopenia. There is a favored subacute to remote oblique distal tibial fracture. Mild posterior displacement of the distal fragment. Adjacent callus formation. Lucency of the medial malleolus is nonspecific. Soft tissue edema. IMPRESSION: Profound osteopenia limits evaluation. There is a favored subacute to remote oblique distal tibial fracture. Lucency of the tip of the medial malleolus is nonspecific and may reflect an additional fracture. Recommend correlation with point tenderness. Electronically Signed   By: Meda Klinefelter M.D.   On: 11/09/2022 11:28    ROS: other than fever is negative  Blood pressure (!) 169/73, pulse 79, temperature 98.1 F (36.7 C),  temperature source Oral, resp. rate 18, height 5\' 5"  (1.651 m), weight 59 kg, SpO2 98 %. General appearance: alert and no distress Resp: clear to auscultation bilaterally Cardio: regular rate and rhythm, S1, S2 normal, no murmur, click, rub or gallop GI: soft, non-tender; bowel sounds normal; no masses,  no organomegaly Extremities: extremities normal, atraumatic, no cyanosis or edema Left upper arm AVF-  patent  Assessment/Plan: 57 year old hispanic female with many medical issues including ESRD-  admitted with a gram negative bacteremia 1 bacteremia -  per ID and primary-  currently on vanc-  imaging to be done-  decub felt to be the source 2 ESRD: cont HD MWF on schedule via AVF-  due today  3 Hypertension/volume: BP adequate at present-  norvasc, coreg, avapro-  low sodium and current weight indicates some volume-  small lady-  2 liter goal today  4. Anemia of ESRD: due for ESA today -  will give  5. Metabolic Bone Disease: continue rocaltrol -  phos is OK right now-  maybe on renvela as OP-  has not been ordered here    [name] 11/11/2022, 8:23 AM

## 2022-11-11 NOTE — Progress Notes (Signed)
Consult for TEE for E faecalis and staph bacteremia rule out endocarditis. TTE without vegetations. Heart RRR with 2/6 systolic murmur LSB, lungs basilar crackles    [facility] has been requested to perform a transesophageal echocardiogram on [facility] for bacteremia.  After careful review of history and examination, the risks and benefits of transesophageal echocardiogram have been explained including risks of esophageal damage, perforation (1:10,000 risk), bleeding, pharyngeal hematoma as well as other potential complications associated with conscious sedation including aspiration, arrhythmia, respiratory failure and death. Alternatives to treatment were discussed, questions were answered. Patient is willing to proceed.   Jacolyn Reedy, PA-C  11/11/2022 10:04 AM
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
## 2022-11-11 NOTE — Anesthesia Preprocedure Evaluation (Signed)
Anesthesia Evaluation  Patient identified by MRN, date of birth, ID band Patient awake    Reviewed: Allergy & Precautions, H&P , NPO status , Patient's Chart, lab work & pertinent test results, reviewed documented beta blocker date and time   Airway Mallampati: II  TM Distance: >3 FB Neck ROM: Full    Dental  (+) Missing, Dental Advisory Given   Pulmonary pneumonia   Pulmonary exam normal breath sounds clear to auscultation       Cardiovascular Exercise Tolerance: Good hypertension, Pt. on medications and Pt. on home beta blockers Normal cardiovascular exam Rhythm:Regular Rate:Normal     Neuro/Psych Seizures -,  PSYCHIATRIC DISORDERS Anxiety        GI/Hepatic Neg liver ROS,GERD  Medicated,,  Endo/Other  negative endocrine ROS    Renal/GU Renal disease  negative genitourinary   Musculoskeletal negative musculoskeletal ROS (+)    Abdominal   Peds negative pediatric ROS (+)  Hematology  (+) Blood dyscrasia, anemia   Anesthesia Other Findings Buprenorphine patch  Reproductive/Obstetrics negative OB ROS                             Anesthesia Physical Anesthesia Plan  ASA: 3  Anesthesia Plan: General   Post-op Pain Management: Minimal or no pain anticipated   Induction: Intravenous  PONV Risk Score and Plan: Propofol infusion  Airway Management Planned: Nasal Cannula and Natural Airway  Additional Equipment:   Intra-op Plan:   Post-operative Plan:   Informed Consent: I have reviewed the patients History and Physical, chart, labs and discussed the procedure including the risks, benefits and alternatives for the proposed anesthesia with the patient or authorized representative who has indicated his/her understanding and acceptance.     Dental advisory given  Plan Discussed with: CRNA and Surgeon  Anesthesia Plan Comments:        Anesthesia Quick Evaluation

## 2022-11-12 DIAGNOSIS — F419 Anxiety disorder, unspecified: Secondary | ICD-10-CM

## 2022-11-12 DIAGNOSIS — G8220 Paraplegia, unspecified: Secondary | ICD-10-CM | POA: Diagnosis not present

## 2022-11-12 DIAGNOSIS — I10 Essential (primary) hypertension: Secondary | ICD-10-CM | POA: Diagnosis not present

## 2022-11-12 DIAGNOSIS — N186 End stage renal disease: Secondary | ICD-10-CM | POA: Diagnosis not present

## 2022-11-12 DIAGNOSIS — R7881 Bacteremia: Secondary | ICD-10-CM | POA: Diagnosis not present

## 2022-11-12 LAB — URINE CULTURE

## 2022-11-12 LAB — CBC
HCT: 25.4 % — ABNORMAL LOW (ref 36.0–46.0)
Hemoglobin: 7.4 g/dL — ABNORMAL LOW (ref 12.0–15.0)
MCH: 26.5 pg (ref 26.0–34.0)
MCHC: 29.1 g/dL — ABNORMAL LOW (ref 30.0–36.0)
MCV: 91 fL (ref 80.0–100.0)
Platelets: 394 10*3/uL (ref 150–400)
RBC: 2.79 MIL/uL — ABNORMAL LOW (ref 3.87–5.11)
RDW: 18.3 % — ABNORMAL HIGH (ref 11.5–15.5)
WBC: 8 10*3/uL (ref 4.0–10.5)
nRBC: 0 % (ref 0.0–0.2)

## 2022-11-12 LAB — RENAL FUNCTION PANEL
Albumin: 2.2 g/dL — ABNORMAL LOW (ref 3.5–5.0)
Anion gap: 9 (ref 5–15)
BUN: 20 mg/dL (ref 6–20)
CO2: 26 mmol/L (ref 22–32)
Calcium: 8.1 mg/dL — ABNORMAL LOW (ref 8.9–10.3)
Chloride: 96 mmol/L — ABNORMAL LOW (ref 98–111)
Creatinine, Ser: 2.92 mg/dL — ABNORMAL HIGH (ref 0.44–1.00)
GFR, Estimated: 19 mL/min — ABNORMAL LOW (ref >60)
Glucose, Bld: 78 mg/dL (ref 70–99)
Phosphorus: 4 mg/dL (ref 2.5–4.6)
Potassium: 4 mmol/L (ref 3.5–5.1)
Sodium: 131 mmol/L — ABNORMAL LOW (ref 135–145)

## 2022-11-12 LAB — CULTURE, BLOOD (ROUTINE X 2): Special Requests: ADEQUATE

## 2022-11-12 LAB — HEPATITIS B SURFACE ANTIBODY, QUANTITATIVE: Hep B S AB Quant (Post): 318 m[IU]/mL (ref >9.9)

## 2022-11-12 LAB — GLUCOSE, CAPILLARY
Glucose-Capillary: 81 mg/dL (ref 70–99)
Glucose-Capillary: 95 mg/dL (ref 70–99)

## 2022-11-12 MED ORDER — SENNOSIDES-DOCUSATE SODIUM 8.6-50 MG PO TABS: 2.0000 | ORAL_TABLET | Freq: Every day | ORAL | 3 refills | Status: DC

## 2022-11-12 MED ORDER — ALBUTEROL SULFATE HFA 108 (90 BASE) MCG/ACT IN AERS: 2.0000 | INHALATION_SPRAY | RESPIRATORY_TRACT | 1 refills | Status: DC | PRN

## 2022-11-12 MED ORDER — CLONIDINE HCL 0.1 MG PO TABS: 0.1000 mg | ORAL_TABLET | Freq: Two times a day (BID) | ORAL | 11 refills | Status: DC

## 2022-11-12 MED ORDER — AMLODIPINE BESYLATE 5 MG PO TABS: 5.0000 mg | ORAL_TABLET | Freq: Every day | ORAL | 3 refills | Status: DC

## 2022-11-12 MED ORDER — VANCOMYCIN IV (FOR PTA / DISCHARGE USE ONLY)
500.0000 mg | INTRAVENOUS | 0 refills | Status: AC
Start: 2022-11-13 — End: 2022-11-18

## 2022-11-12 NOTE — Progress Notes (Signed)
Outpatient Antibiotic Plan for Dialysis   Indication: Bacteremia  Regimen: Vancomycin 500 mg every MWF with HD  End date: 11/18/22    No formal OPAT will be done since patient will receive antibiotics at her dialysis center. Nephrology aware of plan.     Thank you for allowing pharmacy to be a part of this patient's care.  Sharin Mons, PharmD, BCPS, BCIDP Infectious Diseases Clinical Pharmacist Phone: [phone] 11/12/2022, 11:09 AM

## 2022-11-12 NOTE — Progress Notes (Signed)
Pharmacy states they do not have any of the patient's home medications

## 2022-11-12 NOTE — Progress Notes (Signed)
ID brief note ( remote, not seen in person)  Remains afebrile Cr improving to 2.91, wbc 8 6/13 blood cx 1 set Amp S E faecalis and Oxa R Staph haemolyticus, 2nd set Group C strep 6/13 throat Group A strep + 6/14 blood cx NG in 4 days  6/14 UA with pyuria, bacteruria, urine cx multiple spp  ECHO TEE  Result Date: 11/11/2022    TRANSESOPHOGEAL ECHO REPORT   Patient Name:   [name] Seen Date of Exam: 11/11/2022 Medical Rec #:  [id number]        Height:       65.0 in Accession #:    [id number]       Weight:       130.0 lb Date of Birth:  11/04/1965         BSA:          1.647 m Patient Age:    57 years         BP:           168/82 mmHg Patient Gender: F                HR:           82 bpm. [name] Procedure: Transesophageal Echo, Cardiac Doppler and Color Doppler Indications:    Bacteremia R78.81  History:        Patient has prior history of Echocardiogram examinations, most                 recent 11/09/2022. Risk Factors:Hypertension. ESRD, Cancer,                 Altered Mental Status and Fever.  Sonographer:    Celesta Gentile RCS Referring Phys: [phone] COURAGE EMOKPAE PROCEDURE: The transesophogeal probe was passed without difficulty through the esophogus of the patient. Sedation performed by different physician. The patient developed no complications during the procedure.  IMPRESSIONS  1. No obvious vegetations.  2. The left ventricle has normal function.  3. Right ventricular systolic function is normal. The right ventricular size is normal.  4. No left atrial/left atrial appendage thrombus was detected.  5. A small pericardial effusion is present.  6. The mitral valve is normal in structure. Trivial mitral valve regurgitation.  7. TV is mildly thickened.  8. The aortic valve is tricuspid. Aortic valve regurgitation is not visualized.  9. Evidence of atrial level shunting detected by color flow Doppler. Agitated saline contrast bubble study was positive with shunting observed within 3-6  cardiac cycles suggestive of interatrial shunt. FINDINGS  Left Ventricle: The left ventricle has normal function. The left ventricular internal cavity size was normal in size. Right Ventricle: The right ventricular size is normal. Right vetricular wall thickness was not assessed. Right ventricular systolic function is normal. Left Atrium: Left atrial size was normal in size. No left atrial/left atrial appendage thrombus was detected. Right Atrium: Right atrial size was normal in size. Pericardium: A small pericardial effusion is present. Mitral Valve: The mitral valve is normal in structure. Trivial mitral valve regurgitation. Tricuspid Valve: TV is mildly thickened. Tricuspid valve regurgitation is trivial. Aortic Valve: The aortic valve is tricuspid. Aortic valve regurgitation is not visualized. Pulmonic Valve: The pulmonic valve was normal in structure. Pulmonic valve regurgitation is trivial. Aorta: The aortic root is normal in size and structure. There is minimal (Grade I) plaque. IAS/Shunts: Evidence of atrial level shunting detected by color flow Doppler. Agitated saline contrast bubble study was positive with  shunting observed within 3-6 cardiac cycles suggestive of interatrial shunt. Dietrich Pates MD Electronically signed by Dietrich Pates MD Signature Date/Time: 11/11/2022/5:04:49 PM    Final    CT ABDOMEN PELVIS W CONTRAST  Result Date: 11/10/2022 CLINICAL DATA:  Concern for abscess. EXAM: CT ABDOMEN AND PELVIS WITH CONTRAST TECHNIQUE: Multidetector CT imaging of the abdomen and pelvis was performed using the standard protocol following bolus administration of intravenous contrast. RADIATION DOSE REDUCTION: This exam was performed according to the departmental dose-optimization program which includes automated exposure control, adjustment of the mA and/or kV according to patient size and/or use of iterative reconstruction technique. CONTRAST:  OMNIPAQUE IOHEXOL 300 MG/ML  SOLN COMPARISON:  CT abdomen  pelvis dated 07/23/2022. FINDINGS: Lower chest: Bibasilar subsegmental atelectasis. Stable mild cardiomegaly. No intra-abdominal free air or free fluid. Hepatobiliary: The liver is unremarkable. No biliary dilatation. The gallbladder is unremarkable. Pancreas: Unremarkable. No pancreatic ductal dilatation or surrounding inflammatory changes. Spleen: Normal in size without focal abnormality. Adrenals/Urinary Tract: The adrenal glands unremarkable. Atrophic kidneys. There is no hydronephrosis on either side. Subcentimeter bilateral renal hypodense lesions are too small to characterize. The urinary bladder is decompressed around a suprapubic catheter. Stomach/Bowel: There is no bowel obstruction or active inflammation. The appendix is normal. Vascular/Lymphatic: Mild aortoiliac atherosclerotic disease. The IVC is unremarkable. No portal venous gas. There is no adenopathy. Reproductive: The uterus is retroflexed.  No adnexal masses. Other: Complex subcutaneous collection superficial to the coccyx as seen previously. Musculoskeletal: There is decubitus ulcer over the greater trochanter of the left femur. No drainable fluid collection or abscess. No acute osseous pathology. IMPRESSION: 1. No acute intra-abdominal or pelvic pathology. No intra-abdominal abscess. 2.  Aortic Atherosclerosis (ICD10-I70.0). Electronically Signed   By: Elgie Collard M.D.   On: 11/10/2022 17:54   CT CHEST WO CONTRAST  Result Date: 11/09/2022 CLINICAL DATA:  Chronic cough EXAM: CT CHEST WITHOUT CONTRAST TECHNIQUE: Multidetector CT imaging of the chest was performed following the standard protocol without IV contrast. RADIATION DOSE REDUCTION: This exam was performed according to the departmental dose-optimization program which includes automated exposure control, adjustment of the mA and/or kV according to patient size and/or use of iterative reconstruction technique. COMPARISON:  10/21/2015 FINDINGS: Cardiovascular: Cardiomegaly with  left-sided enlargement. Small pericardial effusion. Dilated central pulmonary arteries suggesting pulmonary hypertension. Scattered calcified aortic plaque without aneurysm. Mediastinum/Nodes: No mass or adenopathy. Lungs/Pleura: Dependent atelectasis/consolidation posteriorly in the lower lobes. Ground-glass opacities in the posterior segment left upper lobe and posterior lingula, new since previous, infectious versus atelectatic in origin. No worrisome nodule or suggestion of septic emboli. Upper Abdomen: Bilateral renal parenchymal loss.  No acute findings. Musculoskeletal: Post posterior thoracic instrumented fusion T3-T10 with chronic grade 2 anterolisthesis T6-7. 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 Old healed sternal fracture deformity. Old right clavicle fracture deformity. IMPRESSION: 1. Dependent atelectasis/consolidation posteriorly in the lower lobes. 2. Ground-glass opacities in the posterior segment left upper lobe and posterior lingula, infectious versus atelectatic in origin. 3. Cardiomegaly with left-sided enlargement and small pericardial effusion. 4. Dilated central pulmonary arteries suggesting pulmonary hypertension. 5.  Aortic Atherosclerosis (ICD10-I70.0). Electronically Signed   By: Corlis Leak M.D.   On: 11/09/2022 16:05   ECHOCARDIOGRAM COMPLETE  Result Date: 11/09/2022    ECHOCARDIOGRAM REPORT   Patient Name:   [name] Seen Date of Exam: 11/09/2022 Medical Rec #:  [id number]        Height:       65.0 in Accession #:    [id number]  Weight:       130.0 lb Date of Birth:  05/05/1966         BSA:          1.647 m Patient Age:    57 years         BP:           145/63 mmHg Patient Gender: F                HR:           85 bpm. [name] Procedure: 2D Echo, Cardiac Doppler and Color Doppler Indications:    Bacteremia  History:        Patient has prior history of Echocardiogram examinations, most                 recent 05/04/2014. Signs/Symptoms:Bacteremia, Altered  Mental                 Status and Fever; Risk Factors:Hypertension. ESRD. Cancer.  Sonographer:    Sheralyn Boatman RDCS Referring Phys: [id number] COURAGE EMOKPAE IMPRESSIONS  1. Left ventricular ejection fraction, by estimation, is 50 to 55%. The left ventricle has low normal function. The left ventricle has no regional wall motion abnormalities. There is mild left ventricular hypertrophy. Left ventricular diastolic parameters are consistent with Grade II diastolic dysfunction (pseudonormalization).  2. Right ventricular systolic function is normal. The right ventricular size is normal.  3. Left atrial size was mild to moderately dilated.  4. A small pericardial effusion is present. The pericardial effusion is posterior to the left ventricle and the left atrium. There is no evidence of cardiac tamponade.  5. The mitral valve is normal in structure. No evidence of mitral valve regurgitation.  6. The aortic valve is normal in structure. Aortic valve regurgitation is not visualized.  7. The inferior vena cava is normal in size with greater than 50% respiratory variability, suggesting right atrial pressure of 3 mmHg. FINDINGS  Left Ventricle: Left ventricular ejection fraction, by estimation, is 50 to 55%. The left ventricle has low normal function. The left ventricle has no regional wall motion abnormalities. The left ventricular internal cavity size was normal in size. There is mild left ventricular hypertrophy. Left ventricular diastolic parameters are consistent with Grade II diastolic dysfunction (pseudonormalization). Right Ventricle: The right ventricular size is normal. Right ventricular systolic function is normal. Left Atrium: Left atrial size was mild to moderately dilated. Right Atrium: Right atrial size was normal in size. Pericardium: A small pericardial effusion is present. The pericardial effusion is posterior to the left ventricle and the left atrium. There is no evidence of cardiac tamponade. Mitral Valve: The  mitral valve is normal in structure. No evidence of mitral valve regurgitation. Tricuspid Valve: Tricuspid valve regurgitation is mild. Aortic Valve: The aortic valve is normal in structure. Aortic valve regurgitation is not visualized. Pulmonic Valve: Pulmonic valve regurgitation is trivial. Aorta: The aortic root and ascending aorta are structurally normal, with no evidence of dilitation. Venous: The inferior vena cava is normal in size with greater than 50% respiratory variability, suggesting right atrial pressure of 3 mmHg. IAS/Shunts: No atrial level shunt detected by color flow Doppler.  LEFT VENTRICLE PLAX 2D LVIDd:         4.90 cm      Diastology LVIDs:         3.50 cm      LV e' medial:    5.98 cm/s LV PW:  2.10 cm      LV E/e' medial:  23.2 LV IVS:        1.20 cm      LV e' lateral:   8.81 cm/s LVOT diam:     2.25 cm      LV E/e' lateral: 15.8 LV SV:         91 LV SV Index:   55 LVOT Area:     3.98 cm  LV Volumes (MOD) LV vol d, MOD A2C: 146.0 ml LV vol d, MOD A4C: 121.0 ml LV vol s, MOD A2C: 59.2 ml LV vol s, MOD A4C: 50.1 ml LV SV MOD A2C:     86.8 ml LV SV MOD A4C:     121.0 ml LV SV MOD BP:      82.2 ml RIGHT VENTRICLE             IVC RV S prime:     13.60 cm/s  IVC diam: 1.60 cm TAPSE (M-mode): 2.6 cm LEFT ATRIUM             Index        RIGHT ATRIUM           Index LA diam:        2.20 cm 1.34 cm/m   RA Area:     11.10 cm LA Vol (A2C):   63.2 ml 38.37 ml/m  RA Volume:   22.80 ml  13.84 ml/m LA Vol (A4C):   55.3 ml 33.57 ml/m LA Biplane Vol: 65.4 ml 39.70 ml/m  AORTIC VALVE             PULMONIC VALVE LVOT Vmax:   129.00 cm/s PR End Diast Vel: 1.71 msec LVOT Vmean:  82.500 cm/s LVOT VTI:    0.229 m  AORTA Ao Root diam: 3.80 cm Ao Asc diam:  3.60 cm MITRAL VALVE                TRICUSPID VALVE MV Area (PHT): 6.54 cm     TR Peak grad:   21.5 mmHg MV Decel Time: 116 msec     TR Vmax:        232.00 cm/s MV E velocity: 139.00 cm/s MV A velocity: 79.50 cm/s   SHUNTS MV E/A ratio:  1.75          Systemic VTI:  0.23 m                             Systemic Diam: 2.25 cm Carolan Clines Electronically signed by Carolan Clines Signature Date/Time: 11/09/2022/3:56:19 PM    Final    DG Ankle 2 Views Right  Result Date: 11/09/2022 CLINICAL DATA:  [id number] Pain [id number] EXAM: RIGHT ANKLE - 2 VIEW COMPARISON:  None Available. FINDINGS: Severe osteopenia. There is a favored subacute to remote oblique distal tibial fracture. Mild posterior displacement of the distal fragment. Adjacent callus formation. Lucency of the medial malleolus is nonspecific. Soft tissue edema. IMPRESSION: Profound osteopenia limits evaluation. There is a favored subacute to remote oblique distal tibial fracture. Lucency of the tip of the medial malleolus is nonspecific and may reflect an additional fracture. Recommend correlation with point tenderness. Electronically Signed   By: Meda Klinefelter M.D.   On: 11/09/2022 11:28   DG Chest Port 1 View  Result Date: 11/08/2022 CLINICAL DATA:  Cough. Positive blood cultures. History of strep throat and pneumonia on antibiotics. EXAM: PORTABLE CHEST 1 VIEW COMPARISON:  11/07/2022 FINDINGS: Postoperative fixation of the thoracic spine. Cardiac enlargement. Atelectasis or infiltration suggested in the left lung base. Small pleural effusions bilaterally. No pneumothorax. Mediastinal contours appear intact. Calcification of the aorta. IMPRESSION: Infiltration or atelectasis in the left base is similar to prior study. Small bilateral pleural effusions. Electronically Signed   By: Burman Nieves M.D.   On: 11/08/2022 16:36   DG Chest 2 View  Result Date: 11/07/2022 CLINICAL DATA:  Cough EXAM: CHEST - 2 VIEW COMPARISON:  Previous studies including the examination of 10/12/2022 FINDINGS: Transverse diameter of heart is increased. Apparent shift of mediastinum to the left may be due to rotation. Increased markings are seen in medial left lower lung field. There is blunting of both lateral CP angles. There  is no pneumothorax. There is surgical fusion in thoracic spine. There is deformity in the right clavicle from previous injury. Medial end of right clavicle is not seen. IMPRESSION: Increased markings are seen in medial left lower lung fields suggesting atelectasis/pneumonia. There is blunting of both lateral CP angles suggesting small bilateral pleural effusions. Electronically Signed   By: Ernie Avena M.D.   On: 11/07/2022 18:50     No mention of infected decubitus ulcers, GU symptoms or concerns at dialysis access ? Translocation from DU vs GU   Continue Vancomycin with HD, plan for 10 days from 6/14 Monitor CBC, BMP and Vancomycin trough  ID will so, recall if needed  D/w primary team   I have spent at least 10 mins for this non face to face patient encounter including review of chart and coordination of care with primary team  Odette Fraction, MD Infectious Disease Physician [facility] [address] [place], [place] [address] Phone: [phone]  Fax: [phone]

## 2022-11-12 NOTE — Progress Notes (Signed)
Nsg Discharge Note  Admit Date:  11/08/2022 Discharge date: 11/12/2022   Sierria Onofrey to be D/C'd Home per MD order.  AVS completed.  Copy for chart, and copy for patient signed, and dated. Patient/caregiver able to verbalize understanding.  Discharge Medication: Allergies as of 11/12/2022       Reactions   Benadryl [diphenhydramine Hcl (sleep)] Hives   Quinine Derivatives Other (See Comments)   Alters mental status   Vancomycin    Pt is tolerating this medication at HD   Azithromycin Itching, Rash   Tetracycline Itching   Able to tolerate Doxycycline.    Zosyn [piperacillin Sod-tazobactam So] Rash        Medication List     STOP taking these medications    cefUROXime 250 MG tablet Commonly known as: CEFTIN   lidocaine 2 % jelly Commonly known as: XYLOCAINE   methocarbamol 500 MG tablet Commonly known as: ROBAXIN   ondansetron 4 MG disintegrating tablet Commonly known as: ZOFRAN-ODT   oxybutynin 5 MG 24 hr tablet Commonly known as: DITROPAN-XL   povidone-iodine 10 % ointment Commonly known as: BETADINE   sevelamer carbonate 800 MG tablet Commonly known as: RENVELA   Sodium Hypochlorite 0.057 % Liqd   traMADol 50 MG tablet Commonly known as: ULTRAM       TAKE these medications    acetaminophen 500 MG tablet Commonly known as: TYLENOL Take 1,000 mg by mouth every 6 (six) hours as needed for moderate pain.   albuterol 108 (90 Base) MCG/ACT inhaler Commonly known as: VENTOLIN HFA Inhale 2 puffs into the lungs every 4 (four) hours as needed for wheezing or shortness of breath. What changed:  how much to take when to take this   amLODipine 5 MG tablet Commonly known as: NORVASC Take 1 tablet (5 mg total) by mouth daily. Take 1 tablet on Monday,Wednesday and Friday then take 2 tablets on Tuesday,Thursday,Saturday and Sunday.   buprenorphine 7.5 MCG/HR Commonly known as: BUTRANS Place 1 patch onto the skin once a week.   carvedilol 6.25 MG  tablet Commonly known as: COREG Take 6.25 mg by mouth 2 (two) times daily with a meal.   cloNIDine 0.1 MG tablet Commonly known as: CATAPRES Take 1 tablet (0.1 mg total) by mouth 2 (two) times daily.   docusate sodium 100 MG capsule Commonly known as: COLACE Take 100 mg by mouth at bedtime.   furosemide 20 MG tablet Commonly known as: LASIX Take 20 mg by mouth daily.   irbesartan 150 MG tablet Commonly known as: AVAPRO Take 150 mg by mouth every evening.   levETIRAcetam 500 MG tablet Commonly known as: KEPPRA Take 1,000 mg by mouth daily. Take an additional tablet on Monday,Wednesday and Friday evening after Dialysis   lidocaine-prilocaine cream Commonly known as: EMLA Apply 1 Application topically 3 (three) times a week.   naloxone 4 MG/0.1ML Liqd nasal spray kit Commonly known as: NARCAN Place into the nose.   pantoprazole 40 MG tablet Commonly known as: PROTONIX Take 40 mg by mouth daily.   RENA-VITE RX PO Take 1 tablet by mouth daily.   senna-docusate 8.6-50 MG tablet Commonly known as: Senokot-S Take 2 tablets by mouth at bedtime.   vancomycin  IVPB Inject 500 mg into the vein every Monday, Wednesday, and Friday with hemodialysis for 5 days. Vancomycin 500 mg iv post HD on M/W/F thru 11/18/22 Indication:  Bacteremia First Dose: No Last Day of Therapy:  11/18/22 Labs with dialysis Method of administration:Elastomeric Method of administration  may be changed at the discretion of the patient and/or caregiver's ability to self-administer the medication ordered. Start taking on: November 13, 2022               Discharge Care Instructions  (From admission, onward)           Start     Ordered   11/12/22 0000  Change dressing on IV access line weekly and PRN  (Home infusion instructions - [facility] )        11/12/22 1448   11/12/22 0000  Discharge wound care:       Comments: As advised   11/12/22 1448            Discharge  Assessment: Vitals:   11/11/22 2144 11/12/22 1449  BP: (!) 167/74 (!) 160/72  Pulse: 85 91  Resp: 20 18  Temp: 98.4 F (36.9 C) 98 F (36.7 C)  SpO2: 98% 98%   Skin clean, dry and intact without evidence of skin break down, no evidence of skin tears noted. IV catheter discontinued intact. Site without signs and symptoms of complications - no redness or edema noted at insertion site, patient denies c/o pain - only slight tenderness at site.  Dressing with slight pressure applied.  D/c Instructions-Education: Discharge instructions given to patient/family with verbalized understanding. D/c education completed with patient/family including follow up instructions, medication list, d/c activities limitations if indicated, with other d/c instructions as indicated by MD - patient able to verbalize understanding, all questions fully answered. Patient instructed to return to ED, call 911, or call MD for any changes in condition.  Patient escorted via WC, and D/C home via private auto.  Demetrio Lapping, LPN 1/61/0960 4:54 PM

## 2022-11-12 NOTE — Discharge Summary (Signed)
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    Final    DG Ankle 2 Views Right  Result Date: 11/09/2022 CLINICAL DATA:  [id number] Pain [id number] EXAM: RIGHT ANKLE - 2 VIEW COMPARISON:  None Available. FINDINGS: Severe osteopenia. There is a favored subacute to remote oblique distal tibial fracture. Mild posterior displacement of the distal fragment. Adjacent callus formation. Lucency of the medial malleolus is nonspecific. Soft tissue edema. IMPRESSION: Profound osteopenia limits evaluation. There is a favored subacute to remote oblique distal tibial fracture. Lucency of the tip of the medial malleolus is nonspecific and may reflect an additional fracture. Recommend correlation with point tenderness. Electronically Signed   By: Meda Klinefelter M.D.   On: 11/09/2022 11:28   DG Chest Port 1 View  Result Date: 11/08/2022 CLINICAL DATA:   Cough. Positive blood cultures. History of strep throat and pneumonia on antibiotics. EXAM: PORTABLE CHEST 1 VIEW COMPARISON:  11/07/2022 FINDINGS: Postoperative fixation of the thoracic spine. Cardiac enlargement. Atelectasis or infiltration suggested in the left lung base. Small pleural effusions bilaterally. No pneumothorax. Mediastinal contours appear intact. Calcification of the aorta. IMPRESSION: Infiltration or atelectasis in the left base is similar to prior study. Small bilateral pleural effusions. Electronically Signed   By: Burman Nieves M.D.   On: 11/08/2022 16:36   DG Chest 2 View  Result Date: 11/07/2022 CLINICAL DATA:  Cough EXAM: CHEST - 2 VIEW COMPARISON:  Previous studies including the examination  of 10/12/2022 FINDINGS: Transverse diameter of heart is increased. Apparent shift of mediastinum to the left may be due to rotation. Increased markings are [name] in medial left lower lung field. There is blunting of both lateral CP angles. There is no pneumothorax. There is surgical fusion in thoracic spine. There is deformity in the right clavicle from previous injury. Medial end of right clavicle is not [name]. IMPRESSION: Increased markings are [name] in medial left lower lung fields suggesting atelectasis/pneumonia. There is blunting of both lateral CP angles suggesting small bilateral pleural effusions. Electronically Signed   By: Ernie Avena M.D.   On: 11/07/2022 18:50    Micro Results   Recent Results (from the past 240 hour(s))  Group A Strep by PCR     Status: Abnormal   Collection Time: 11/07/22  6:00 PM   Specimen: Throat; Sterile Swab  Result Value Ref Range Status   Group A Strep by PCR DETECTED (A) NOT DETECTED Final    Comment: Performed at [facility], [address], [place], [place] [address]  Resp panel by RT-PCR (RSV, Flu A&B, Covid) Throat     Status: None   Collection Time: 11/07/22  6:00 PM   Specimen: Throat; Nasal Swab  Result Value Ref Range Status   SARS  Coronavirus 2 by RT PCR NEGATIVE NEGATIVE Final    Comment: (NOTE) SARS-CoV-2 target nucleic acids are NOT DETECTED.  The SARS-CoV-2 RNA is generally detectable in upper respiratory specimens during the acute phase of infection. The lowest concentration of SARS-CoV-2 viral copies this assay can detect is 138 copies/mL. A negative result does not preclude SARS-Cov-2 infection and should not be used as the sole basis for treatment or other patient management decisions. A negative result may occur with  improper specimen collection/handling, submission of specimen other than nasopharyngeal swab, presence of viral mutation(s) within the areas targeted by this assay, and inadequate number of viral copies(<138 copies/mL). A negative result must be combined with clinical observations, patient history, and epidemiological information. The expected result is Negative.  Fact Sheet for Patients:  BloggerCourse.com  Fact Sheet for Healthcare Providers:  SeriousBroker.it  This test is no t yet approved or cleared by the [place] FDA and  has been authorized for detection and/or diagnosis of SARS-CoV-2 by FDA under an Emergency Use Authorization (EUA). This EUA will remain  in effect (meaning this test can be used) for the duration of the COVID-19 declaration under Section 564(b)(1) of the Act, 21 U.S.C.section 360bbb-3(b)(1), unless the authorization is terminated  or revoked sooner.       Influenza A by PCR NEGATIVE NEGATIVE Final   Influenza B by PCR NEGATIVE NEGATIVE Final    Comment: (NOTE) The Xpert Xpress SARS-CoV-2/FLU/RSV plus assay is intended as an aid in the diagnosis of influenza from Nasopharyngeal swab specimens and should not be used as a sole basis for treatment. Nasal washings and aspirates are unacceptable for Xpert Xpress SARS-CoV-2/FLU/RSV testing.  Fact Sheet for  Patients: BloggerCourse.com  Fact Sheet for Healthcare Providers: SeriousBroker.it  This test is not yet approved or cleared by the [place] FDA and has been authorized for detection and/or diagnosis of SARS-CoV-2 by FDA under an Emergency Use Authorization (EUA). This EUA will remain in effect (meaning this test can be used) for the duration of the COVID-19 declaration under Section 564(b)(1) of the Act, 21 U.S.C. section 360bbb-3(b)(1), unless the authorization is terminated or revoked.     Resp Syncytial Virus by PCR NEGATIVE NEGATIVE Final  Comment: (NOTE) Fact Sheet for Patients: BloggerCourse.com  Fact Sheet for Healthcare Providers: SeriousBroker.it  This test is not yet approved or cleared by the [place] FDA and has been authorized for detection and/or diagnosis of SARS-CoV-2 by FDA under an Emergency Use Authorization (EUA). This EUA will remain in effect (meaning this test can be used) for the duration of the COVID-19 declaration under Section 564(b)(1) of the Act, 21 U.S.C. section 360bbb-3(b)(1), unless the authorization is terminated or revoked.  Performed at [facility], [address], [place], [place] [address]   Urine Culture     Status: Abnormal   Collection Time: 11/07/22  7:53 PM   Specimen: Urine, Clean Catch  Result Value Ref Range Status   Specimen Description   Final    URINE, CLEAN CATCH Performed at [facility], [address] [place], [place] [address]    Special Requests   Final    NONE Performed at [facility], [address] [place], [place] [address]    Culture (A)  Final    >=100,000 COLONIES/mL MULTIPLE SPECIES PRESENT, SUGGEST RECOLLECTION   Report Status 11/10/2022 FINAL  Final  Culture, blood (routine x 2)     Status: Abnormal   Collection Time: 11/07/22  8:32 PM   Specimen: BLOOD RIGHT ARM  Result Value Ref Range  Status   Specimen Description   Final    BLOOD RIGHT ARM Performed at [facility], [address] [place], [place] [address]    Special Requests   Final    BOTTLES DRAWN AEROBIC AND ANAEROBIC Blood Culture adequate volume Performed at [facility], [address], [place], [place] [address]    Culture  Setup Time   Final    GRAM POSITIVE COCCI IN BOTH AEROBIC AND ANAEROBIC BOTTLES Gram Stain Report Called to,Read Back By and Verified With: FOWLER D @ 1220 ON [id number] BY HENDERSON L Performed at [facility], [address], [place], [place] [address]    Culture (A)  Final    STREPTOCOCCUS GROUP C Beta hemolytic streptococci are predictably susceptible to penicillin and other beta lactams. Susceptibility testing not routinely performed. Performed at [facility], [address] [place], [place] [address]    Report Status 11/10/2022 FINAL  Final  Culture, blood (routine x 2)     Status: Abnormal   Collection Time: 11/07/22  8:40 PM   Specimen: BLOOD RIGHT ARM  Result Value Ref Range Status   Specimen Description   Final    BLOOD RIGHT ARM Performed at [facility], [address] [place], [place] [address]    Special Requests   Final    BOTTLES DRAWN AEROBIC AND ANAEROBIC Blood Culture adequate volume Performed at [facility], [address], [place], [place] [address]    Culture  Setup Time   Final    GRAM POSITIVE COCCI IN BOTH AEROBIC AND ANAEROBIC BOTTLES CRITICAL RESULT CALLED TO, READ BACK BY AND VERIFIED WITH: FOWLER,D. @ 1421 ON 11/08/2022 BY FRATTO,A. CRITICAL RESULT CALLED TO, READ BACK BY AND VERIFIED WITH: RN Mosetta Anis ON 11/08/22 @ 1753 BY DRT Performed at [facility], [address] [place], [place] [address]    Culture (A)  Final    ENTEROCOCCUS FAECALIS STAPHYLOCOCCUS HAEMOLYTICUS    Report Status 11/12/2022 FINAL  Final   Organism ID, Bacteria ENTEROCOCCUS FAECALIS  Final   Organism ID, Bacteria STAPHYLOCOCCUS HAEMOLYTICUS  Final       Susceptibility  Enterococcus faecalis - MIC*    AMPICILLIN <=2 SENSITIVE Sensitive     VANCOMYCIN 1 SENSITIVE Sensitive     GENTAMICIN SYNERGY RESISTANT Resistant     * ENTEROCOCCUS FAECALIS   Staphylococcus haemolyticus - MIC*    CIPROFLOXACIN 2 INTERMEDIATE Intermediate     ERYTHROMYCIN >=8 RESISTANT Resistant     GENTAMICIN <=0.5 SENSITIVE Sensitive     OXACILLIN >=4 RESISTANT Resistant     TETRACYCLINE 2 SENSITIVE Sensitive     VANCOMYCIN 1 SENSITIVE Sensitive     TRIMETH/SULFA >=320 RESISTANT Resistant     CLINDAMYCIN <=0.25 SENSITIVE Sensitive     RIFAMPIN <=0.5 SENSITIVE Sensitive     Inducible Clindamycin NEGATIVE Sensitive     * STAPHYLOCOCCUS HAEMOLYTICUS  Blood Culture ID Panel (Reflexed)     Status: Abnormal   Collection Time: 11/07/22  8:40 PM  Result Value Ref Range Status   Enterococcus faecalis DETECTED (A) NOT DETECTED Final    Comment: CRITICAL RESULT CALLED TO, READ BACK BY AND VERIFIED WITH: RN MORGAN EANES ON 11/08/22 @ 1753 BY DRT    Enterococcus Faecium NOT DETECTED NOT DETECTED Final   Listeria monocytogenes NOT DETECTED NOT DETECTED Final   Staphylococcus species DETECTED (A) NOT DETECTED Final    Comment: CRITICAL RESULT CALLED TO, READ BACK BY AND VERIFIED WITH: RN MORGAN EANES ON 11/08/22 @ 1753 BY DRT    Staphylococcus aureus (BCID) NOT DETECTED NOT DETECTED Final   Staphylococcus epidermidis NOT DETECTED NOT DETECTED Final   Staphylococcus lugdunensis NOT DETECTED NOT DETECTED Final   Streptococcus species NOT DETECTED NOT DETECTED Final   Streptococcus agalactiae NOT DETECTED NOT DETECTED Final   Streptococcus pneumoniae NOT DETECTED NOT DETECTED Final   Streptococcus pyogenes NOT DETECTED NOT DETECTED Final   A.calcoaceticus-baumannii NOT DETECTED NOT DETECTED Final   Bacteroides fragilis NOT DETECTED NOT DETECTED Final   Enterobacterales NOT DETECTED NOT DETECTED Final   Enterobacter cloacae complex NOT DETECTED NOT DETECTED Final    Escherichia coli NOT DETECTED NOT DETECTED Final   Klebsiella aerogenes NOT DETECTED NOT DETECTED Final   Klebsiella oxytoca NOT DETECTED NOT DETECTED Final   Klebsiella pneumoniae NOT DETECTED NOT DETECTED Final   Proteus species NOT DETECTED NOT DETECTED Final   Salmonella species NOT DETECTED NOT DETECTED Final   Serratia marcescens NOT DETECTED NOT DETECTED Final   Haemophilus influenzae NOT DETECTED NOT DETECTED Final   Neisseria meningitidis NOT DETECTED NOT DETECTED Final   Pseudomonas aeruginosa NOT DETECTED NOT DETECTED Final   Stenotrophomonas maltophilia NOT DETECTED NOT DETECTED Final   Candida albicans NOT DETECTED NOT DETECTED Final   Candida auris NOT DETECTED NOT DETECTED Final   Candida glabrata NOT DETECTED NOT DETECTED Final   Candida krusei NOT DETECTED NOT DETECTED Final   Candida parapsilosis NOT DETECTED NOT DETECTED Final   Candida tropicalis NOT DETECTED NOT DETECTED Final   Cryptococcus neoformans/gattii NOT DETECTED NOT DETECTED Final   Vancomycin resistance NOT DETECTED NOT DETECTED Final    Comment: Performed at [facility], [address], [place], [place] [address]  Culture, blood (routine x 2)     Status: None (Preliminary result)   Collection Time: 11/08/22  4:24 PM   Specimen: BLOOD  Result Value Ref Range Status   Specimen Description BLOOD BLOOD RIGHT ARM  Final   Special Requests   Final    BOTTLES DRAWN AEROBIC AND ANAEROBIC Blood Culture adequate volume   Culture   Final    NO GROWTH 4 DAYS Performed at  [facility], [address] [place], [place] [address]    Report Status PENDING  Incomplete  Urine Culture     Status: Abnormal   Collection Time: 11/08/22  4:25 PM   Specimen: Urine, Clean Catch  Result Value Ref Range Status   Specimen Description   Final    URINE, CLEAN CATCH Performed at [facility], [address], [place], [place] [address]    Special Requests   Final    NONE Performed at [facility], [address],  [place], [place] [address]    Culture MULTIPLE SPECIES PRESENT, SUGGEST RECOLLECTION (A)  Final   Report Status 11/12/2022 FINAL  Final  Culture, blood (routine x 2)     Status: None (Preliminary result)   Collection Time: 11/08/22  4:32 PM   Specimen: BLOOD  Result Value Ref Range Status   Specimen Description BLOOD BLOOD RIGHT ARM  Final   Special Requests   Final    BOTTLES DRAWN AEROBIC AND ANAEROBIC Blood Culture adequate volume   Culture   Final    NO GROWTH 4 DAYS Performed at [facility], [address] [place], [place] [address]    Report Status PENDING  Incomplete   Today   Subjective    Adrina Draves today has no new complaints No fever  Or chills   No Nausea, Vomiting or Diarrhea   Patient has been [name] and examined prior to discharge   Objective   Blood pressure (!) 167/74, pulse 85, temperature 98.4 F (36.9 C), temperature source Oral, resp. rate 20, height 5\' 5"  (1.651 m), weight 48.1 kg, SpO2 98 %.   Intake/Output Summary (Last 24 hours) at 11/12/2022 1449 Last data filed at 11/12/2022 0900 Gross per 24 hour  Intake 120 ml  Output 2000 ml  Net -1880 ml    Exam Gen:- Awake Alert,  in no apparent distress , chronically ill and frail appearing HEENT:- [place].AT, No sclera icterus Neck-Supple Neck,No JVD,.  Lungs-  CTAB , fair symmetrical air movement CV- S1, S2 normal, regular  Abd-  +ve B.Sounds, Abd Soft, No tenderness, suprapubic catheter   Extremity--  pedal pulses present  Psych-affect is appropriate, oriented x3 Neuro-paraplegic with chronic neuromuscular deficits,, generalized weakness , no new focal deficits, no tremors MSK-right lower leg/ankle area with erythema and swelling and  warmth..  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 and bruit, aneurysmal dilatation noted Skin-   Media Information   Document Information  Photos  Buttock wounds on Both sides Rt and  Left  11/12/2022 12:04  Attached To:  Hospital Encounter on 11/08/22  Source Information  Shon Hale, MD  [facility]      Media Information   Document Information  Photos  Rt buttock wound  11/12/2022 12:03  Attached To:  Hospital Encounter on 11/08/22  Source Information  Shon Hale, MD  [facility]     Media Information   Document Information  Photos  Lt buttock wound  11/12/2022 11:59  Attached To:  Hospital Encounter on 11/08/22  Source Information  Shon Hale, MD  [facility]     Media Information   Document Information  Photos  Lt Buttock  11/12/2022 12:00  Attached To:  Hospital Encounter on 11/08/22  Source Information  Shon Hale, MD  [facility]     Data Review   CBC w Diff:  Lab Results  Component Value Date   WBC 8.0  11/12/2022   HGB 7.4 (L) 11/12/2022   HCT 25.4 (L) 11/12/2022   PLT 394 11/12/2022   LYMPHOPCT 10 11/08/2022   MONOPCT 3 11/08/2022   EOSPCT 1 11/08/2022   BASOPCT 0 11/08/2022    CMP:  Lab Results  Component Value Date   NA 131 (L) 11/12/2022   NA 137 06/14/2014   K 4.0 11/12/2022   CL 96 (L) 11/12/2022   CO2 26 11/12/2022   BUN 20 11/12/2022   BUN 10 06/14/2014   CREATININE 2.92 (H) 11/12/2022   CREATININE 0.76 04/18/2015   PROT 7.1 11/08/2022   PROT 5.4 (L) 06/14/2014   ALBUMIN 2.2 (L) 11/12/2022   ALBUMIN 2.4 (L) 06/14/2014   BILITOT 0.6 11/08/2022   ALKPHOS 88 11/08/2022   AST 11 (L) 11/08/2022   ALT 10 11/08/2022   Total Discharge time is about 33 minutes  Shon Hale M.D on 11/12/2022 at 2:49 PM  Go to [email or web address] -  for contact info  [facility] - Office  [phone]

## 2022-11-12 NOTE — Progress Notes (Signed)
Subjective:  completed HD yesterday -  removed 2 liters no issues-  had TEE neg for vegetation  Objective Vital signs in last 24 hours: Vitals:   11/11/22 1830 11/11/22 1900 11/11/22 1920 11/11/22 2144  BP: (!) 143/74 (!) 163/81 (!) 160/81 (!) 167/74  Pulse: 80 79 74 85  Resp: 16 18 18 20   Temp:   98 F (36.7 C) 98.4 F (36.9 C)  TempSrc:   Oral Oral  SpO2:   99% 98%  Weight:      Height:       Weight change:   Intake/Output Summary (Last 24 hours) at 11/12/2022 0827 Last data filed at 11/11/2022 1920 Gross per 24 hour  Intake 340 ml  Output 2000 ml  Net -1660 ml    Dialyzes at [facility] MWF  3 hours  EDW 45.5. HD Bath 3K/2.5 calc, Dialyzer unknown, Heparin none. 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 Metabolic Bone Disease: continue rocaltrol -  phos is OK right now-  maybe on renvela as OP-  has not been ordered here as of yet     [name]    Labs: Basic Metabolic Panel: Recent Labs  Lab 11/08/22 1624 11/11/22 0514 11/12/22 0424  NA 134* 128* 131*  K 3.1* 4.7 4.0  CL 94* 96* 96*  CO2 31 22 26   GLUCOSE 93 80 78  BUN 15 39* 20  CREATININE 1.95* 5.12* 2.92*  CALCIUM 8.3* 8.2* 8.1*  PHOS  --  4.8* 4.0   Liver Function Tests: Recent Labs  Lab 11/08/22 1624 11/11/22 0514 11/12/22 0424  AST 11*  --   --   ALT 10  --   --   ALKPHOS 88  --   --   BILITOT 0.6  --   --   PROT 7.1  --   --    ALBUMIN 2.2* 2.1* 2.2*   No results for input(s): "LIPASE", "AMYLASE" in the last 168 hours. No results for input(s): "AMMONIA" in the last 168 hours. CBC: Recent Labs  Lab 11/07/22 2032 11/08/22 1624 11/09/22 0328 11/11/22 0514 11/12/22 0424  WBC 20.8* 18.2* 14.0* 7.1 8.0  NEUTROABS 19.5* 15.5*  --   --   --   HGB 9.0* 7.8* 7.1* 7.3* 7.4*  HCT 31.6* 27.3* 24.9* 25.5* 25.4*  MCV 92.7 92.2 92.2 91.4 91.0  PLT 411* 354 322 380 394   Cardiac Enzymes: No results for input(s): "CKTOTAL", "CKMB", "CKMBINDEX", "TROPONINI" in the last 168 hours. CBG: Recent Labs  Lab 11/11/22 0029 11/11/22 0544 11/11/22 1134 11/12/22 0006 11/12/22 0551  GLUCAP 103* 77 81 95 81    Iron Studies: No results for input(s): "IRON", "TIBC", "TRANSFERRIN", "FERRITIN" in the last 72 hours. Studies/Results: ECHO TEE  Result Date: 11/11/2022    TRANSESOPHOGEAL ECHO REPORT   Patient Name:   AYRIKA KENNEDY Date of Exam: 11/11/2022 Medical Rec #:  [id number]        Height:       65.0 in Accession #:    [id number]       Weight:       130.0 lb Date of Birth:  11/21/1965         BSA:          1.647 m Patient Age:    56 years         BP:           168/82 mmHg Patient Gender: F                HR:           82 bpm. [name] Procedure: Transesophageal Echo, Cardiac Doppler and Color Doppler Indications:    Bacteremia R78.81  History:        Patient has prior history of Echocardiogram examinations, most                 recent 11/09/2022. Risk Factors:Hypertension. ESRD, Cancer,                 Altered Mental Status and Fever.  Sonographer:    Celesta Gentile RCS Referring Phys: [phone] COURAGE EMOKPAE PROCEDURE: The transesophogeal probe was passed without difficulty through the esophogus of the patient. Sedation performed by different physician. The patient developed no complications during the procedure.  IMPRESSIONS  1. No obvious vegetations.  2. The left ventricle has normal function.  3. Right ventricular  systolic function is normal. The right ventricular size is normal.  4. No left atrial/left atrial appendage thrombus was detected.  5. A small pericardial effusion is present.  6. The mitral valve is normal in structure. Trivial mitral valve regurgitation.  7. TV is mildly thickened.  8. The aortic valve is tricuspid. Aortic valve regurgitation is not visualized.  9. Evidence of atrial level shunting detected by color flow Doppler. Agitated saline contrast bubble study was positive with shunting observed within 3-6 cardiac cycles suggestive of interatrial shunt. FINDINGS  Left Ventricle: The left ventricle has normal function. The left ventricular internal cavity size was normal in size. Right Ventricle: The right ventricular size is normal. Right vetricular wall thickness was not assessed. Right ventricular systolic function is normal. Left Atrium: Left atrial size was normal in size. No left atrial/left atrial appendage thrombus was detected. Right Atrium: Right atrial size was normal in size. Pericardium: A small pericardial effusion is present. Mitral Valve: The mitral valve is normal in structure. Trivial mitral valve regurgitation. Tricuspid Valve: TV is mildly thickened. Tricuspid valve regurgitation is trivial. Aortic Valve: The aortic valve is tricuspid. Aortic valve regurgitation is not visualized. Pulmonic Valve: The pulmonic valve was normal in structure. Pulmonic valve regurgitation is trivial. Aorta: The aortic root is normal in size and structure. There is minimal (Grade I) plaque. IAS/Shunts: Evidence of atrial level shunting detected by color flow Doppler. Agitated saline contrast bubble study was positive with shunting observed within 3-6 cardiac cycles suggestive of interatrial shunt. Dietrich Pates MD Electronically signed by Dietrich Pates MD Signature Date/Time: 11/11/2022/5:04:49 PM    Final    CT ABDOMEN PELVIS W CONTRAST  Result Date: 11/10/2022 CLINICAL DATA:  Concern for abscess. EXAM: CT  ABDOMEN AND PELVIS WITH CONTRAST TECHNIQUE: Multidetector CT imaging of the abdomen and pelvis was performed using the standard protocol following bolus administration of intravenous contrast. RADIATION DOSE REDUCTION: This exam was performed according to the departmental dose-optimization program which includes  automated exposure control, adjustment of the mA and/or kV according to patient size and/or use of iterative reconstruction technique. CONTRAST:  OMNIPAQUE IOHEXOL 300 MG/ML  SOLN COMPARISON:  CT abdomen pelvis dated 07/23/2022. FINDINGS: Lower chest: Bibasilar subsegmental atelectasis. Stable mild cardiomegaly. No intra-abdominal free air or free fluid. Hepatobiliary: The liver is unremarkable. No biliary dilatation. The gallbladder is unremarkable. Pancreas: Unremarkable. No pancreatic ductal dilatation or surrounding inflammatory changes. Spleen: Normal in size without focal abnormality. Adrenals/Urinary Tract: The adrenal glands unremarkable. Atrophic kidneys. There is no hydronephrosis on either side. Subcentimeter bilateral renal hypodense lesions are too small to characterize. The urinary bladder is decompressed around a suprapubic catheter. Stomach/Bowel: There is no bowel obstruction or active inflammation. The appendix is normal. Vascular/Lymphatic: Mild aortoiliac atherosclerotic disease. The IVC is unremarkable. No portal venous gas. There is no adenopathy. Reproductive: The uterus is retroflexed.  No adnexal masses. Other: Complex subcutaneous collection superficial to the coccyx as seen previously. Musculoskeletal: There is decubitus ulcer over the greater trochanter of the left femur. No drainable fluid collection or abscess. No acute osseous pathology. IMPRESSION: 1. No acute intra-abdominal or pelvic pathology. No intra-abdominal abscess. 2.  Aortic Atherosclerosis (ICD10-I70.0). Electronically Signed   By: Elgie Collard M.D.   On: 11/10/2022 17:54   Medications: Infusions:   vancomycin 500 mg (11/11/22 1932)    Scheduled Medications:  amLODipine  5 mg Oral Q M,W,F   And   amLODipine  10 mg Oral Once per day on Sun Tue Thu Sat   buprenorphine  1 patch Transdermal Weekly   calcitRIOL  0.75 mcg Oral Q M,W,F-HD   carvedilol  6.25 mg Oral BID WC   Chlorhexidine Gluconate Cloth  6 each Topical Q0600   darbepoetin (ARANESP) injection - DIALYSIS  200 mcg Subcutaneous Q Mon-1800   enoxaparin (LOVENOX) injection  30 mg Subcutaneous Q24H   furosemide  20 mg Oral Daily   irbesartan  150 mg Oral QPM   levETIRAcetam  1,000 mg Oral Daily   levETIRAcetam  500 mg Oral Once per day on Mon Wed Fri   oxybutynin  5 mg Oral Daily   pantoprazole  40 mg Oral Daily   senna-docusate  2 tablet Oral QHS    have reviewed scheduled and prn medications.  Physical Exam: General:  NAD Heart:RRR Lungs: mostly clear Abdomen: soft , non tender Extremities: min edema-  paraplegia Dialysis Access: AVF patent     11/12/2022,8:27 AM  LOS: 4 days

## 2022-11-12 NOTE — TOC Transition Note (Addendum)
Transition of Care [facility]) - CM/SW Discharge Note   Patient Details  Name: [name] MRN: [id number] Date of Birth: 04-16-1966  [facility]) CM/SW Contact:  Karn Cassis, LCSW Phone Number: 11/12/2022, 3:03 PM   Clinical Narrative:  Pt d/c today. LCSW sent d/c summary to [facility] and spoke with RN about IV antibiotics at dialysis. Confirmed received. Clydie Braun with [facility] notified of d/c and HHRN order in.      Final next level of care: Home w Home Health Services Barriers to Discharge: Barriers Resolved   Patient Goals and CMS Choice      Discharge Placement                      Patient and family notified of of transfer: 11/12/22  Discharge Plan and Services Additional resources added to the After Visit Summary for   In-house Referral: Clinical Social Work                          [facility] Agency: [facility] Date [facility] Agency Contacted: 11/12/22 Time HH Agency Contacted: 1502 Representative spoke with at [facility] Agency: Clydie Braun  Social Determinants of Health (SDOH) Interventions SDOH Screenings   Food Insecurity: No Food Insecurity (11/08/2022)  Housing: Low Risk  (11/08/2022)  Transportation Needs: No Transportation Needs (11/08/2022)  Utilities: Not At Risk (11/09/2022)  Tobacco Use: Low Risk  (11/11/2022)     Readmission Risk Interventions    11/09/2022    5:22 PM  Readmission Risk Prevention Plan  Transportation Screening Complete  Medication Review (RN Care Manager) Complete  PCP or Specialist appointment within 3-5 days of discharge Complete  HRI or Home Care Consult Complete  SW Recovery Care/Counseling Consult Complete  Palliative Care Screening Not Applicable  Skilled Nursing Facility Patient Refused

## 2022-11-12 NOTE — Discharge Instructions (Signed)
1)Vancomycin 500 mg iv post Hemodialysis on M/W/F thru 11/18/22--- for bacterial infection your blood 2) please continue modalities on Mondays Wednesdays and Fridays as previously 3) CBC and BMP blood test on Friday, 11/15/2022

## 2022-11-13 ENCOUNTER — Telehealth (HOSPITAL_BASED_OUTPATIENT_CLINIC_OR_DEPARTMENT_OTHER): Payer: Self-pay | Admitting: *Deleted

## 2022-11-13 NOTE — Telephone Encounter (Signed)
Post ED Visit - Positive Culture Follow-up  Culture report reviewed by antimicrobial stewardship pharmacist: [facility] [x]  Daylene Posey, Pharm.D. []  Celedonio Miyamoto, Pharm.D., BCPS AQ-ID []  Garvin Fila, Pharm.D., BCPS []  Georgina Pillion, Pharm.D., BCPS []  [place], [place].D., BCPS, AAHIVP []  Estella Husk, Pharm.D., BCPS, AAHIVP []  Lysle Pearl, PharmD, BCPS []  Phillips Climes, PharmD, BCPS []  Agapito Games, PharmD, BCPS []  Verlan Friends, PharmD []  Mervyn Gay, PharmD, BCPS []  Vinnie Level, PharmD  [facility] []  Len Childs, PharmD []  Greer Pickerel, PharmD []  Adalberto Cole, PharmD []  Perlie Gold, Rph []  Lonell Face) Jean Rosenthal, PharmD []  Earl Many, PharmD []  Junita Push, PharmD []  Dorna Leitz, PharmD []  Terrilee Files, PharmD []  Lynann Beaver, PharmD []  Keturah Barre, PharmD []  Loralee Pacas, PharmD []  Bernadene Person, PharmD   Positive blood culture Currently on Vancomycin per ID and no further patient follow-up is required at this time.  Virl Axe [facility] 11/13/2022, 9:06 AM

## 2022-11-18 ENCOUNTER — Encounter (HOSPITAL_COMMUNITY): Payer: Self-pay | Admitting: Internal Medicine

## 2022-11-29 ENCOUNTER — Encounter (HOSPITAL_COMMUNITY)
Admission: RE | Admit: 2022-11-29 | Discharge: 2022-11-29 | Disposition: A | Discharge status: Home / Self Care | Payer: 59 | Source: Ambulatory Visit | Attending: Gastroenterology | Admitting: Gastroenterology

## 2022-11-29 DIAGNOSIS — N186 End stage renal disease: Secondary | ICD-10-CM

## 2022-11-29 NOTE — Pre-Procedure Instructions (Signed)
Called patient for pre-op call. She states she is going to ride [facility] home. I told her she would still need someone to ride [facility] with her home and why. She states, "I dont live that far from you." Explained again that this was procedure and why. She states, "Well, I'll let my daughter know. Patient also states that, " [facility] will have me there at 12:00", when I told her of arrival time of 11:30. She states, "[facility] is closed today and Monday so that's the time they told me they could bring me." I told her to try and come closer to 11:30 if possible. She will try and call [facility] on Tuesday morning to see if this is possible, but told her to come on when they could bring her.

## 2022-12-03 ENCOUNTER — Ambulatory Visit (HOSPITAL_BASED_OUTPATIENT_CLINIC_OR_DEPARTMENT_OTHER): Payer: 59 | Admitting: Anesthesiology

## 2022-12-03 ENCOUNTER — Encounter (HOSPITAL_COMMUNITY)
Admission: RE | Disposition: A | Discharge status: Home / Self Care | Payer: Self-pay | Source: Home / Self Care | Attending: Gastroenterology

## 2022-12-03 ENCOUNTER — Ambulatory Visit (HOSPITAL_COMMUNITY)
Admission: RE | Admit: 2022-12-03 | Discharge: 2022-12-03 | Disposition: A | Discharge status: Home / Self Care | Payer: 59 | Attending: Gastroenterology | Admitting: Gastroenterology

## 2022-12-03 ENCOUNTER — Ambulatory Visit (HOSPITAL_COMMUNITY): Payer: 59 | Admitting: Anesthesiology

## 2022-12-03 ENCOUNTER — Encounter (HOSPITAL_COMMUNITY): Payer: Self-pay | Admitting: Gastroenterology

## 2022-12-03 DIAGNOSIS — N186 End stage renal disease: Secondary | ICD-10-CM

## 2022-12-03 DIAGNOSIS — G8220 Paraplegia, unspecified: Secondary | ICD-10-CM | POA: Diagnosis not present

## 2022-12-03 DIAGNOSIS — R112 Nausea with vomiting, unspecified: Secondary | ICD-10-CM | POA: Diagnosis not present

## 2022-12-03 DIAGNOSIS — R634 Abnormal weight loss: Secondary | ICD-10-CM | POA: Insufficient documentation

## 2022-12-03 DIAGNOSIS — Z87440 Personal history of urinary (tract) infections: Secondary | ICD-10-CM | POA: Insufficient documentation

## 2022-12-03 DIAGNOSIS — K259 Gastric ulcer, unspecified as acute or chronic, without hemorrhage or perforation: Secondary | ICD-10-CM

## 2022-12-03 DIAGNOSIS — Z79899 Other long term (current) drug therapy: Secondary | ICD-10-CM | POA: Diagnosis not present

## 2022-12-03 DIAGNOSIS — K3189 Other diseases of stomach and duodenum: Secondary | ICD-10-CM | POA: Diagnosis not present

## 2022-12-03 DIAGNOSIS — K219 Gastro-esophageal reflux disease without esophagitis: Secondary | ICD-10-CM | POA: Insufficient documentation

## 2022-12-03 DIAGNOSIS — I120 Hypertensive chronic kidney disease with stage 5 chronic kidney disease or end stage renal disease: Secondary | ICD-10-CM | POA: Diagnosis not present

## 2022-12-03 DIAGNOSIS — G40909 Epilepsy, unspecified, not intractable, without status epilepticus: Secondary | ICD-10-CM | POA: Insufficient documentation

## 2022-12-03 DIAGNOSIS — Z992 Dependence on renal dialysis: Secondary | ICD-10-CM

## 2022-12-03 DIAGNOSIS — F419 Anxiety disorder, unspecified: Secondary | ICD-10-CM | POA: Diagnosis not present

## 2022-12-03 HISTORY — PX: BIOPSY: SHX5522

## 2022-12-03 HISTORY — PX: ESOPHAGOGASTRODUODENOSCOPY (EGD) WITH PROPOFOL: SHX5813

## 2022-12-03 LAB — POCT I-STAT, CHEM 8
BUN: 49 mg/dL — ABNORMAL HIGH (ref 6–20)
Calcium, Ion: 1.14 mmol/L — ABNORMAL LOW (ref 1.15–1.40)
Chloride: 107 mmol/L (ref 98–111)
Creatinine, Ser: 7.1 mg/dL — ABNORMAL HIGH (ref 0.44–1.00)
Glucose, Bld: 102 mg/dL — ABNORMAL HIGH (ref 70–99)
HCT: 34 % — ABNORMAL LOW (ref 36.0–46.0)
Hemoglobin: 11.6 g/dL — ABNORMAL LOW (ref 12.0–15.0)
Potassium: 3.7 mmol/L (ref 3.5–5.1)
Sodium: 138 mmol/L (ref 135–145)
TCO2: 24 mmol/L (ref 22–32)

## 2022-12-03 SURGERY — ESOPHAGOGASTRODUODENOSCOPY (EGD) WITH PROPOFOL
Anesthesia: General

## 2022-12-03 MED ORDER — LIDOCAINE HCL (CARDIAC) PF 100 MG/5ML IV SOSY
PREFILLED_SYRINGE | INTRAVENOUS | Status: DC | PRN
  Administered 2022-12-03: 40 mg via INTRAVENOUS

## 2022-12-03 MED ORDER — PROPOFOL 10 MG/ML IV BOLUS
INTRAVENOUS | Status: DC | PRN
  Administered 2022-12-03: 80 mg via INTRAVENOUS

## 2022-12-03 MED ORDER — PHENYLEPHRINE 80 MCG/ML (10ML) SYRINGE FOR IV PUSH (FOR BLOOD PRESSURE SUPPORT)
PREFILLED_SYRINGE | INTRAVENOUS | Status: AC
  Filled 2022-12-03: qty 10

## 2022-12-03 MED ORDER — SODIUM CHLORIDE 0.9 % IV SOLN: INTRAVENOUS | Status: DC | PRN

## 2022-12-03 MED ORDER — PROPOFOL 500 MG/50ML IV EMUL
INTRAVENOUS | Status: AC
  Filled 2022-12-03: qty 50

## 2022-12-03 MED ORDER — PROPOFOL 500 MG/50ML IV EMUL
INTRAVENOUS | Status: DC | PRN
  Administered 2022-12-03: 100 ug/kg/min via INTRAVENOUS

## 2022-12-03 MED ORDER — LACTATED RINGERS IV SOLN: INTRAVENOUS | Status: DC

## 2022-12-03 MED ORDER — EPHEDRINE 5 MG/ML INJ
INTRAVENOUS | Status: AC
  Filled 2022-12-03: qty 5

## 2022-12-03 MED ORDER — SODIUM CHLORIDE 0.9 % IV SOLN: Freq: Once | INTRAVENOUS | Status: AC

## 2022-12-03 MED ORDER — LACTATED RINGERS IV SOLN: INTRAVENOUS | Status: DC | PRN

## 2022-12-03 NOTE — Transfer of Care (Addendum)
Immediate Anesthesia Transfer of Care Note  Patient: [name]  Procedure(s) Performed: ESOPHAGOGASTRODUODENOSCOPY (EGD) WITH PROPOFOL BIOPSY  Patient Location: PACU  Anesthesia Type:General  Level of Consciousness: awake and pateint uncooperative  Airway & Oxygen Therapy: Patient Spontanous Breathing  Post-op Assessment: Report given to RN and Post -op Vital signs reviewed and stable  Post vital signs: Reviewed and stable  Last Vitals:  Vitals Value Taken Time  BP 174/78 12/03/22   1155  Temp  12/03/22   1155  Pulse 76 12/03/22   1155  Resp 20 12/03/22   1155  SpO2 97% 12/03/22   1155    Last Pain:  Vitals:   12/03/22 1025  TempSrc: Oral         Complications: No notable events documented.

## 2022-12-03 NOTE — Anesthesia Postprocedure Evaluation (Signed)
Anesthesia Post Note  Patient: [name]  Procedure(s) Performed: ESOPHAGOGASTRODUODENOSCOPY (EGD) WITH PROPOFOL BIOPSY  Patient location during evaluation: Phase II Anesthesia Type: General Level of consciousness: awake and alert and oriented Pain management: pain level controlled Vital Signs Assessment: post-procedure vital signs reviewed and stable Respiratory status: spontaneous breathing, nonlabored ventilation and respiratory function stable Cardiovascular status: blood pressure returned to baseline and stable Postop Assessment: no apparent nausea or vomiting Anesthetic complications: no  No notable events documented.   Last Vitals:  Vitals:   12/03/22 1025 12/03/22 1155  BP: (!) 193/84 (!) 174/78  Pulse: 67 74  Resp: 19 20  Temp: 36.6 C   SpO2: 100% 97%    Last Pain:  Vitals:   12/03/22 1155  TempSrc: Oral  PainSc: 1                  Maddie Brazier C Akif Weldy

## 2022-12-03 NOTE — H&P (Signed)
[name] is an 57 y.o. female.   Chief Complaint: Nausea, dry heaving and weight loss HPI: 57 year old female with past medical history of end-stage renal disease, paraplegia, seizures, coming for evaluation of nausea, dry heaving and weight loss.  Patient reports that she had nausea and dry heaving in the past but the symptoms have much more improved and she is now able to eat any type of food.  The patient denies having any nausea, vomiting, fever, chills, hematochezia, melena, hematemesis, abdominal distention, abdominal pain, diarrhea, jaundice, pruritus or weight loss.   Past Medical History:  Diagnosis Date   Abnormal uterine bleeding (AUB) 06/15/2014   Cancer (HCC)    uterine   High blood pressure    Paraplegia (lower)    Seizure disorder (HCC)    Seizures (HCC)    Suprapubic catheter (HCC)    Urinary tract infection     Past Surgical History:  Procedure Laterality Date   APPLICATION OF WOUND VAC Right 09/13/2021   (approximately 1-97mos ago) pressure sore on right hip   BACK SURGERY     Pt stated "before 2000"   ESOPHAGOGASTRODUODENOSCOPY N/A 09/20/2015   Procedure: ESOPHAGOGASTRODUODENOSCOPY (EGD);  Surgeon: Malissa Hippo, MD;  Location: [facility];  Service: Endoscopy;  Laterality: N/A;  730   IR CATHETER TUBE CHANGE  04/02/2018   PERCUTANEOUS ENDOSCOPIC GASTROSTOMY (PEG) REMOVAL N/A 09/20/2015   Procedure: PERCUTANEOUS ENDOSCOPIC GASTROSTOMY (PEG) REMOVAL;  Surgeon: Malissa Hippo, MD;  Location: [facility];  Service: Endoscopy;  Laterality: N/A;   TEE WITHOUT CARDIOVERSION N/A 11/11/2022   Procedure: TRANSESOPHAGEAL ECHOCARDIOGRAM (TEE);  Surgeon: Pricilla Riffle, MD;  Location: [facility] ORS;  Service: Cardiovascular;  Laterality: N/A;    Family History  Problem Relation Age of Onset   Cancer Mother    Hypertension Mother    Cancer Sister        breast and then spread everywhere.   Diabetes Paternal Grandmother    Hypertension Paternal Grandmother     Social History:  reports that she has never smoked. She has never used smokeless tobacco. She reports current alcohol use. She reports that she does not use drugs.  Allergies:  Allergies  Allergen Reactions   Benadryl [Diphenhydramine Hcl (Sleep)] Hives   Daptomycin Hives   Linezolid     Other Reaction(s): GI Intolerance  Patient self-discontinued treatment due to GI intolerance. Taking it along with moxifloxacin   Moxifloxacin     Other Reaction(s): GI Intolerance  Patient self-discontinued treatment due to GI intolerance. Taking it along with linezolid   Quinine Derivatives Other (See Comments)    Alters mental status   Vancomycin     Pt is tolerating this medication at HD   Azithromycin Itching and Rash   Tetracycline Itching    Able to tolerate Doxycycline.    Zosyn [Piperacillin Sod-Tazobactam So] Rash    Medications Prior to Admission  Medication Sig Dispense Refill   acetaminophen (TYLENOL) 500 MG tablet Take 1,000 mg by mouth every 6 (six) hours as needed for moderate pain.     albuterol (VENTOLIN HFA) 108 (90 Base) MCG/ACT inhaler Inhale 2 puffs into the lungs every 4 (four) hours as needed for wheezing or shortness of breath. 18 g 1   amLODipine (NORVASC) 5 MG tablet Take 1 tablet (5 mg total) by mouth daily. Take 1 tablet on Monday,Wednesday and Friday then take 2 tablets on Tuesday,Thursday,Saturday and Sunday. (Patient taking differently: Take 5 mg by mouth See admin instructions. Take 1 tablet  on Monday,Wednesday and Friday then take 2 tablets on Tuesday,Thursday,Saturday and Sunday.) 50 tablet 3   B Complex-C-Folic Acid (RENA-VITE RX PO) Take 1 tablet by mouth every morning.     buprenorphine (BUTRANS) 7.5 MCG/HR Place 1 patch onto the skin once a week.     carvedilol (COREG) 6.25 MG tablet Take 6.25 mg by mouth 2 (two) times daily with a meal.     cloNIDine (CATAPRES) 0.1 MG tablet Take 1 tablet (0.1 mg total) by mouth 2 (two) times daily. 60 tablet 11    furosemide (LASIX) 20 MG tablet Take 20 mg by mouth daily.     irbesartan (AVAPRO) 150 MG tablet Take 150 mg by mouth daily at 6 PM.     levETIRAcetam (KEPPRA) 500 MG tablet Take 1,000 mg by mouth every evening. Take an additional tablet on Monday,Wednesday and Friday evening after Dialysis     lidocaine-prilocaine (EMLA) cream Apply 1 Application topically 3 (three) times a week.     methocarbamol (ROBAXIN) 500 MG tablet Take 500 mg by mouth daily as needed (pain in ribs).     pantoprazole (PROTONIX) 40 MG tablet Take 40 mg by mouth daily.     senna-docusate (SENOKOT-S) 8.6-50 MG tablet Take 2 tablets by mouth at bedtime. 60 tablet 3   naloxone (NARCAN) nasal spray 4 mg/0.1 mL Place 0.4 mg into the nose once.      No results found for this or any previous visit (from the past 48 hour(s)). No results found.  Review of Systems  All other systems reviewed and are negative.   Blood pressure (!) 193/84, pulse 67, temperature 97.9 F (36.6 C), temperature source Oral, resp. rate 19, height 5\' 5"  (1.651 m), weight 48.1 kg, SpO2 100 %. Physical Exam  GENERAL: The patient is AO x3, in no acute distress. HEENT: Head is normocephalic and atraumatic. EOMI are intact. Mouth is well hydrated and without lesions. NECK: Supple. No masses LUNGS: Clear to auscultation. No presence of rhonchi/wheezing/rales. Adequate chest expansion HEART: RRR, normal s1 and s2. ABDOMEN: Soft, nontender, no guarding, no peritoneal signs, and nondistended. BS +. No masses. EXTREMITIES: Without any cyanosis, clubbing, rash, lesions or edema. NEUROLOGIC: AOx3, no focal motor deficit. SKIN: no jaundice, no rashes  Assessment/Plan 57 year old female with past medical history of end-stage renal disease, paraplegia, seizures, coming for evaluation of nausea, dry heaving and weight loss.  Will proceed with EGD.  Dolores Frame, MD 12/03/2022, 10:51 AM

## 2022-12-03 NOTE — Anesthesia Preprocedure Evaluation (Signed)
Anesthesia Evaluation  Patient identified by MRN, date of birth, ID band Patient awake    Reviewed: Allergy & Precautions, H&P , NPO status , Patient's Chart, lab work & pertinent test results, reviewed documented beta blocker date and time   Airway Mallampati: II  TM Distance: >3 FB Neck ROM: Full    Dental  (+) Missing, Dental Advisory Given   Pulmonary pneumonia   Pulmonary exam normal breath sounds clear to auscultation       Cardiovascular Exercise Tolerance: Good hypertension, Pt. on medications and Pt. on home beta blockers Normal cardiovascular exam Rhythm:Regular Rate:Normal     Neuro/Psych Seizures -, Well Controlled,  PSYCHIATRIC DISORDERS Anxiety        GI/Hepatic Neg liver ROS,GERD  Medicated,,  Endo/Other  negative endocrine ROS    Renal/GU ESRF and DialysisRenal disease (last dialysis - friday)  negative genitourinary   Musculoskeletal negative musculoskeletal ROS (+)    Abdominal   Peds negative pediatric ROS (+)  Hematology  (+) Blood dyscrasia, anemia   Anesthesia Other Findings Buprenorphine patch  Reproductive/Obstetrics negative OB ROS                             Anesthesia Physical Anesthesia Plan  ASA: 3  Anesthesia Plan: General   Post-op Pain Management: Minimal or no pain anticipated   Induction: Intravenous  PONV Risk Score and Plan: Propofol infusion  Airway Management Planned: Nasal Cannula and Natural Airway  Additional Equipment:   Intra-op Plan:   Post-operative Plan:   Informed Consent: I have reviewed the patients History and Physical, chart, labs and discussed the procedure including the risks, benefits and alternatives for the proposed anesthesia with the patient or authorized representative who has indicated his/her understanding and acceptance.     Dental advisory given  Plan Discussed with: CRNA and Surgeon  Anesthesia Plan  Comments:        Anesthesia Quick Evaluation

## 2022-12-03 NOTE — Op Note (Signed)
[facility] Patient Name: [name] Procedure Date: 12/03/2022 11:22 AM MRN: [id number] Date of Birth: 09-04-1965 Attending MD: Katrinka Blazing , , [id number] CSN: [id number] Age: 57 Admit Type: Outpatient Procedure:                Upper GI endoscopy Indications:              Nausea with vomiting, Weight loss Providers:                Katrinka Blazing, Nena Polio, RN, Lennice Sites                            Technician, Technician Referring MD:              Medicines:                Monitored Anesthesia Care Complications:            No immediate complications. Estimated Blood Loss:     Estimated blood loss: none. Procedure:                Pre-Anesthesia Assessment:                           - Prior to the procedure, a History and Physical                            was performed, and patient medications, allergies                            and sensitivities were reviewed. The patient's                            tolerance of previous anesthesia was reviewed.                           - The risks and benefits of the procedure and the                            sedation options and risks were discussed with the                            patient. All questions were answered and informed                            consent was obtained.                           - ASA Grade Assessment: III - A patient with severe                            systemic disease.                           After obtaining informed consent, the endoscope was                            passed under direct vision. Throughout the  procedure, the patient's blood pressure, pulse, and                            oxygen saturations were monitored continuously. The                            [id number]-H190 ([id number]) scope was introduced through the                            mouth, and advanced to the second part of duodenum.                            The upper GI endoscopy was  accomplished without                            difficulty. The patient tolerated the procedure                            well. Scope In: 11:42:43 AM Scope Out: 11:46:03 AM Total Procedure Duration: 0 hours 3 minutes 20 seconds  Findings:      The esophagus was normal.      A few localized small erosions with no stigmata of recent bleeding were       found in the gastric antrum. Biopsies were taken with a cold forceps for       histology.      The examined duodenum was normal. Impression:               - Normal esophagus.                           - Erosive gastropathy with no stigmata of recent                            bleeding. Biopsied.                           - Normal examined duodenum. Moderate Sedation:      Per Anesthesia Care Recommendation:           - Discharge patient to home (ambulatory).                           - Resume previous diet.                           - Await pathology results. Procedure Code(s):        --- Professional ---                           [phone], Esophagogastroduodenoscopy, flexible,                            transoral; with biopsy, single or multiple Diagnosis Code(s):        --- Professional ---                           K31.89, Other diseases  of stomach and duodenum                           R11.2, Nausea with vomiting, unspecified                           R63.4, Abnormal weight loss CPT copyright 2022 [employer]. All rights reserved. The codes documented in this report are preliminary and upon coder review may  be revised to meet current compliance requirements. Katrinka Blazing, MD Katrinka Blazing,  12/03/2022 11:56:48 AM This report has been signed electronically. Number of Addenda: 0

## 2022-12-03 NOTE — Discharge Instructions (Signed)
You are being discharged to home.  Resume your previous diet.  We are waiting for your pathology results.  

## 2022-12-04 ENCOUNTER — Encounter (INDEPENDENT_AMBULATORY_CARE_PROVIDER_SITE_OTHER): Payer: Self-pay | Admitting: *Deleted

## 2022-12-04 LAB — SURGICAL PATHOLOGY

## 2022-12-10 ENCOUNTER — Encounter (HOSPITAL_COMMUNITY): Payer: Self-pay | Admitting: Gastroenterology

## 2022-12-13 ENCOUNTER — Emergency Department (HOSPITAL_COMMUNITY): Payer: 59

## 2022-12-13 ENCOUNTER — Inpatient Hospital Stay (HOSPITAL_COMMUNITY)
Admission: EM | Admit: 2022-12-13 | Discharge: 2022-12-20 | DRG: 871 | Disposition: A | Discharge status: Other Acute Inpatient Hospital | Payer: 59 | Attending: Internal Medicine | Admitting: Internal Medicine

## 2022-12-13 ENCOUNTER — Encounter (HOSPITAL_COMMUNITY): Payer: Self-pay

## 2022-12-13 ENCOUNTER — Other Ambulatory Visit: Payer: Self-pay

## 2022-12-13 DIAGNOSIS — R509 Fever, unspecified: Secondary | ICD-10-CM

## 2022-12-13 DIAGNOSIS — G8220 Paraplegia, unspecified: Secondary | ICD-10-CM | POA: Diagnosis present

## 2022-12-13 DIAGNOSIS — G8929 Other chronic pain: Secondary | ICD-10-CM | POA: Diagnosis present

## 2022-12-13 DIAGNOSIS — G40909 Epilepsy, unspecified, not intractable, without status epilepticus: Secondary | ICD-10-CM

## 2022-12-13 DIAGNOSIS — L89220 Pressure ulcer of left hip, unstageable: Secondary | ICD-10-CM | POA: Diagnosis present

## 2022-12-13 DIAGNOSIS — J189 Pneumonia, unspecified organism: Secondary | ICD-10-CM | POA: Diagnosis present

## 2022-12-13 DIAGNOSIS — M869 Osteomyelitis, unspecified: Secondary | ICD-10-CM

## 2022-12-13 DIAGNOSIS — M898X9 Other specified disorders of bone, unspecified site: Secondary | ICD-10-CM | POA: Diagnosis present

## 2022-12-13 DIAGNOSIS — A419 Sepsis, unspecified organism: Principal | ICD-10-CM | POA: Diagnosis present

## 2022-12-13 DIAGNOSIS — I1311 Hypertensive heart and chronic kidney disease without heart failure, with stage 5 chronic kidney disease, or end stage renal disease: Secondary | ICD-10-CM | POA: Diagnosis present

## 2022-12-13 DIAGNOSIS — L8990 Pressure ulcer of unspecified site, unspecified stage: Secondary | ICD-10-CM | POA: Diagnosis present

## 2022-12-13 DIAGNOSIS — M86152 Other acute osteomyelitis, left femur: Secondary | ICD-10-CM | POA: Diagnosis not present

## 2022-12-13 DIAGNOSIS — Z9359 Other cystostomy status: Secondary | ICD-10-CM | POA: Diagnosis not present

## 2022-12-13 DIAGNOSIS — M25451 Effusion, right hip: Secondary | ICD-10-CM | POA: Diagnosis present

## 2022-12-13 DIAGNOSIS — J90 Pleural effusion, not elsewhere classified: Secondary | ICD-10-CM | POA: Diagnosis present

## 2022-12-13 DIAGNOSIS — N186 End stage renal disease: Secondary | ICD-10-CM | POA: Diagnosis present

## 2022-12-13 DIAGNOSIS — Z888 Allergy status to other drugs, medicaments and biological substances status: Secondary | ICD-10-CM

## 2022-12-13 DIAGNOSIS — Z79899 Other long term (current) drug therapy: Secondary | ICD-10-CM

## 2022-12-13 DIAGNOSIS — M009 Pyogenic arthritis, unspecified: Secondary | ICD-10-CM | POA: Diagnosis present

## 2022-12-13 DIAGNOSIS — Z992 Dependence on renal dialysis: Secondary | ICD-10-CM | POA: Diagnosis present

## 2022-12-13 DIAGNOSIS — R609 Edema, unspecified: Secondary | ICD-10-CM | POA: Diagnosis present

## 2022-12-13 DIAGNOSIS — D631 Anemia in chronic kidney disease: Secondary | ICD-10-CM | POA: Diagnosis present

## 2022-12-13 DIAGNOSIS — M00851 Arthritis due to other bacteria, right hip: Secondary | ICD-10-CM | POA: Diagnosis not present

## 2022-12-13 DIAGNOSIS — L89154 Pressure ulcer of sacral region, stage 4: Secondary | ICD-10-CM | POA: Diagnosis present

## 2022-12-13 DIAGNOSIS — I3139 Other pericardial effusion (noninflammatory): Secondary | ICD-10-CM | POA: Diagnosis present

## 2022-12-13 DIAGNOSIS — S31000A Unspecified open wound of lower back and pelvis without penetration into retroperitoneum, initial encounter: Secondary | ICD-10-CM | POA: Diagnosis present

## 2022-12-13 DIAGNOSIS — K219 Gastro-esophageal reflux disease without esophagitis: Secondary | ICD-10-CM | POA: Diagnosis present

## 2022-12-13 DIAGNOSIS — Z833 Family history of diabetes mellitus: Secondary | ICD-10-CM

## 2022-12-13 DIAGNOSIS — J9811 Atelectasis: Secondary | ICD-10-CM | POA: Diagnosis present

## 2022-12-13 DIAGNOSIS — Z881 Allergy status to other antibiotic agents status: Secondary | ICD-10-CM

## 2022-12-13 DIAGNOSIS — Z8542 Personal history of malignant neoplasm of other parts of uterus: Secondary | ICD-10-CM

## 2022-12-13 DIAGNOSIS — Z8249 Family history of ischemic heart disease and other diseases of the circulatory system: Secondary | ICD-10-CM

## 2022-12-13 DIAGNOSIS — I10 Essential (primary) hypertension: Secondary | ICD-10-CM | POA: Diagnosis present

## 2022-12-13 DIAGNOSIS — L89200 Pressure ulcer of unspecified hip, unstageable: Secondary | ICD-10-CM | POA: Diagnosis not present

## 2022-12-13 LAB — URINALYSIS, W/ REFLEX TO CULTURE (INFECTION SUSPECTED)
Bilirubin Urine: NEGATIVE
Glucose, UA: 150 mg/dL — AB
Ketones, ur: NEGATIVE mg/dL
Nitrite: NEGATIVE
Protein, ur: >=300 mg/dL — AB
RBC / HPF: >50 RBC/hpf (ref 0–5)
Specific Gravity, Urine: 1.009 (ref 1.005–1.030)
pH: 8 (ref 5.0–8.0)

## 2022-12-13 LAB — CBC WITH DIFFERENTIAL/PLATELET
Abs Immature Granulocytes: 0.05 10*3/uL (ref 0.00–0.07)
Basophils Absolute: 0 10*3/uL (ref 0.0–0.1)
Basophils Relative: 0 %
Eosinophils Absolute: 0 10*3/uL (ref 0.0–0.5)
Eosinophils Relative: 0 %
HCT: 34.5 % — ABNORMAL LOW (ref 36.0–46.0)
Hemoglobin: 10.2 g/dL — ABNORMAL LOW (ref 12.0–15.0)
Immature Granulocytes: 0 %
Lymphocytes Relative: 8 %
Lymphs Abs: 0.9 10*3/uL (ref 0.7–4.0)
MCH: 28.8 pg (ref 26.0–34.0)
MCHC: 29.6 g/dL — ABNORMAL LOW (ref 30.0–36.0)
MCV: 97.5 fL (ref 80.0–100.0)
Monocytes Absolute: 0.5 10*3/uL (ref 0.1–1.0)
Monocytes Relative: 5 %
Neutro Abs: 9.9 10*3/uL — ABNORMAL HIGH (ref 1.7–7.7)
Neutrophils Relative %: 87 %
Platelets: 169 10*3/uL (ref 150–400)
RBC: 3.54 MIL/uL — ABNORMAL LOW (ref 3.87–5.11)
RDW: 17.2 % — ABNORMAL HIGH (ref 11.5–15.5)
WBC: 11.3 10*3/uL — ABNORMAL HIGH (ref 4.0–10.5)
nRBC: 0 % (ref 0.0–0.2)

## 2022-12-13 LAB — PROTIME-INR
INR: 1.2 (ref 0.8–1.2)
Prothrombin Time: 15.6 seconds — ABNORMAL HIGH (ref 11.4–15.2)

## 2022-12-13 LAB — COMPREHENSIVE METABOLIC PANEL
ALT: 13 U/L (ref 0–44)
AST: 19 U/L (ref 15–41)
Albumin: 2.8 g/dL — ABNORMAL LOW (ref 3.5–5.0)
Alkaline Phosphatase: 80 U/L (ref 38–126)
Anion gap: 12 (ref 5–15)
BUN: 44 mg/dL — ABNORMAL HIGH (ref 6–20)
CO2: 22 mmol/L (ref 22–32)
Calcium: 7.9 mg/dL — ABNORMAL LOW (ref 8.9–10.3)
Chloride: 100 mmol/L (ref 98–111)
Creatinine, Ser: 5.86 mg/dL — ABNORMAL HIGH (ref 0.44–1.00)
GFR, Estimated: 8 mL/min — ABNORMAL LOW (ref >60)
Glucose, Bld: 128 mg/dL — ABNORMAL HIGH (ref 70–99)
Potassium: 4.2 mmol/L (ref 3.5–5.1)
Sodium: 134 mmol/L — ABNORMAL LOW (ref 135–145)
Total Bilirubin: 0.8 mg/dL (ref 0.3–1.2)
Total Protein: 7.2 g/dL (ref 6.5–8.1)

## 2022-12-13 LAB — LACTIC ACID, PLASMA
Lactic Acid, Venous: 1.3 mmol/L (ref 0.5–1.9)
Lactic Acid, Venous: 1 mmol/L (ref 0.5–1.9)

## 2022-12-13 MED ORDER — VANCOMYCIN HCL 750 MG/150ML IV SOLN
750.0000 mg | Freq: Once | INTRAVENOUS | Status: AC
  Administered 2022-12-13: 750 mg via INTRAVENOUS
  Filled 2022-12-13 (×2): qty 150

## 2022-12-13 MED ORDER — CHLORHEXIDINE GLUCONATE CLOTH 2 % EX PADS
6.0000 | MEDICATED_PAD | Freq: Every day | CUTANEOUS | Status: DC
  Administered 2022-12-14 – 2022-12-18 (×6): 6 via TOPICAL

## 2022-12-13 MED ORDER — SODIUM CHLORIDE 0.9 % IV SOLN
2.0000 g | Freq: Once | INTRAVENOUS | Status: AC
  Administered 2022-12-13: 2 g via INTRAVENOUS
  Filled 2022-12-13: qty 12.5

## 2022-12-13 MED ORDER — KETOROLAC TROMETHAMINE 15 MG/ML IJ SOLN
15.0000 mg | Freq: Once | INTRAMUSCULAR | Status: AC
  Administered 2022-12-14: 15 mg via INTRAVENOUS
  Filled 2022-12-13: qty 1

## 2022-12-13 MED ORDER — ACETAMINOPHEN 500 MG PO TABS
1000.0000 mg | ORAL_TABLET | Freq: Once | ORAL | Status: AC
  Administered 2022-12-13: 1000 mg via ORAL
  Filled 2022-12-13: qty 2

## 2022-12-13 NOTE — Progress Notes (Signed)
A consult was received from an ED physician for vancomycin per pharmacy dosing.  The patient's profile has been reviewed for ht/wt/allergies/indication/available labs.    A one time order has been placed for vancomycin 750 mg IV.    Further antibiotics/pharmacy consults should be ordered by admitting physician if indicated.                       Thank you, Lynden Ang, PharmD, BCPS 12/13/2022  9:44 PM

## 2022-12-13 NOTE — ED Triage Notes (Signed)
Pt brought in by EMS from home, reports fever, diarrhea that started this morning. Pt goes to dialysis [facility], [facility], F, but did not go today b/c she didn't feel good.

## 2022-12-13 NOTE — ED Provider Notes (Signed)
  Provider Note MRN:  [id number]  Arrival date & time: 12/13/22    ED Course and Medical Decision Making  Assumed care from Dr. Posey Rea at shift change.  History of paraplegia, ESRD here with sepsis, suspected soft tissue source, decubitus wound with foul smell.  Will need admission.  1138pm update: Patient overall well-appearing on my assessment, mildly tachycardic, normal mental status.  Accepted for admission by hospitalist service.  .Critical Care  Performed by: Sabas Sous, MD Authorized by: Sabas Sous, MD   Critical care provider statement:    Critical care time (minutes):  30   Critical care was necessary to treat or prevent imminent or life-threatening deterioration of the following conditions:  Sepsis   Critical care was time spent personally by me on the following activities:  Development of treatment plan with patient or surrogate, discussions with consultants, evaluation of patient's response to treatment, examination of patient, ordering and review of laboratory studies, ordering and review of radiographic studies, ordering and performing treatments and interventions, pulse oximetry, re-evaluation of patient's condition and review of old charts   Final Clinical Impressions(s) / ED Diagnoses     ICD-10-CM   1. Wound of sacral region, initial encounter  S31.000A     2. Fever, unspecified fever cause  R50.9       ED Discharge Orders     None       Discharge Instructions   None     Elmer Sow. Pilar Plate, MD [facility] [email or web address]    Sabas Sous, MD 12/13/22 [phone]

## 2022-12-14 DIAGNOSIS — K219 Gastro-esophageal reflux disease without esophagitis: Secondary | ICD-10-CM

## 2022-12-14 DIAGNOSIS — G40909 Epilepsy, unspecified, not intractable, without status epilepticus: Secondary | ICD-10-CM

## 2022-12-14 DIAGNOSIS — A419 Sepsis, unspecified organism: Principal | ICD-10-CM

## 2022-12-14 DIAGNOSIS — L89200 Pressure ulcer of unspecified hip, unstageable: Secondary | ICD-10-CM

## 2022-12-14 DIAGNOSIS — N186 End stage renal disease: Secondary | ICD-10-CM | POA: Diagnosis not present

## 2022-12-14 DIAGNOSIS — G8929 Other chronic pain: Secondary | ICD-10-CM | POA: Diagnosis not present

## 2022-12-14 DIAGNOSIS — I10 Essential (primary) hypertension: Secondary | ICD-10-CM

## 2022-12-14 LAB — COMPREHENSIVE METABOLIC PANEL
ALT: 12 U/L (ref 0–44)
AST: 20 U/L (ref 15–41)
Albumin: 2.5 g/dL — ABNORMAL LOW (ref 3.5–5.0)
Alkaline Phosphatase: 65 U/L (ref 38–126)
Anion gap: 11 (ref 5–15)
BUN: 52 mg/dL — ABNORMAL HIGH (ref 6–20)
CO2: 20 mmol/L — ABNORMAL LOW (ref 22–32)
Calcium: 8 mg/dL — ABNORMAL LOW (ref 8.9–10.3)
Chloride: 104 mmol/L (ref 98–111)
Creatinine, Ser: 6.12 mg/dL — ABNORMAL HIGH (ref 0.44–1.00)
GFR, Estimated: 8 mL/min — ABNORMAL LOW (ref >60)
Glucose, Bld: 118 mg/dL — ABNORMAL HIGH (ref 70–99)
Potassium: 3.8 mmol/L (ref 3.5–5.1)
Sodium: 135 mmol/L (ref 135–145)
Total Bilirubin: 1 mg/dL (ref 0.3–1.2)
Total Protein: 6.3 g/dL — ABNORMAL LOW (ref 6.5–8.1)

## 2022-12-14 LAB — HEPATITIS B SURFACE ANTIGEN: Hepatitis B Surface Ag: NONREACTIVE

## 2022-12-14 LAB — CBC WITH DIFFERENTIAL/PLATELET
Abs Immature Granulocytes: 0.04 10*3/uL (ref 0.00–0.07)
Basophils Absolute: 0 10*3/uL (ref 0.0–0.1)
Basophils Relative: 0 %
Eosinophils Absolute: 0 10*3/uL (ref 0.0–0.5)
Eosinophils Relative: 0 %
HCT: 29.9 % — ABNORMAL LOW (ref 36.0–46.0)
Hemoglobin: 9 g/dL — ABNORMAL LOW (ref 12.0–15.0)
Immature Granulocytes: 0 %
Lymphocytes Relative: 10 %
Lymphs Abs: 1 10*3/uL (ref 0.7–4.0)
MCH: 29.6 pg (ref 26.0–34.0)
MCHC: 30.1 g/dL (ref 30.0–36.0)
MCV: 98.4 fL (ref 80.0–100.0)
Monocytes Absolute: 0.4 10*3/uL (ref 0.1–1.0)
Monocytes Relative: 4 %
Neutro Abs: 8.2 10*3/uL — ABNORMAL HIGH (ref 1.7–7.7)
Neutrophils Relative %: 86 %
Platelets: 147 10*3/uL — ABNORMAL LOW (ref 150–400)
RBC: 3.04 MIL/uL — ABNORMAL LOW (ref 3.87–5.11)
RDW: 17.2 % — ABNORMAL HIGH (ref 11.5–15.5)
WBC: 9.6 10*3/uL (ref 4.0–10.5)
nRBC: 0 % (ref 0.0–0.2)

## 2022-12-14 LAB — PROTIME-INR
INR: 1.3 — ABNORMAL HIGH (ref 0.8–1.2)
Prothrombin Time: 16 seconds — ABNORMAL HIGH (ref 11.4–15.2)

## 2022-12-14 LAB — C-REACTIVE PROTEIN: CRP: 10.9 mg/dL — ABNORMAL HIGH (ref <1.0)

## 2022-12-14 LAB — PROCALCITONIN: Procalcitonin: 2.21 ng/mL

## 2022-12-14 LAB — MAGNESIUM: Magnesium: 2 mg/dL (ref 1.7–2.4)

## 2022-12-14 LAB — PHOSPHORUS: Phosphorus: 5.2 mg/dL — ABNORMAL HIGH (ref 2.5–4.6)

## 2022-12-14 LAB — CULTURE, BLOOD (ROUTINE X 2): Special Requests: ADEQUATE

## 2022-12-14 LAB — MRSA NEXT GEN BY PCR, NASAL: MRSA by PCR Next Gen: NOT DETECTED

## 2022-12-14 LAB — HIV ANTIBODY (ROUTINE TESTING W REFLEX): HIV Screen 4th Generation wRfx: NONREACTIVE

## 2022-12-14 LAB — CORTISOL-AM, BLOOD: Cortisol - AM: 17.3 ug/dL (ref 6.7–22.6)

## 2022-12-14 LAB — SEDIMENTATION RATE: Sed Rate: 55 mm/hr — ABNORMAL HIGH (ref 0–22)

## 2022-12-14 MED ORDER — AMLODIPINE BESYLATE 5 MG PO TABS
10.0000 mg | ORAL_TABLET | ORAL | Status: DC
  Administered 2022-12-14: 10 mg via ORAL
  Filled 2022-12-14: qty 2

## 2022-12-14 MED ORDER — METHOCARBAMOL 500 MG PO TABS: 500.0000 mg | ORAL_TABLET | Freq: Every day | ORAL | Status: DC | PRN

## 2022-12-14 MED ORDER — HEPARIN SODIUM (PORCINE) 5000 UNIT/ML IJ SOLN
5000.0000 [IU] | Freq: Three times a day (TID) | INTRAMUSCULAR | Status: DC
  Administered 2022-12-14 – 2022-12-19 (×17): 5000 [IU] via SUBCUTANEOUS
  Filled 2022-12-14 (×17): qty 1

## 2022-12-14 MED ORDER — ONDANSETRON HCL 4 MG/2ML IJ SOLN: 4.0000 mg | Freq: Four times a day (QID) | INTRAMUSCULAR | Status: DC | PRN

## 2022-12-14 MED ORDER — CHLORHEXIDINE GLUCONATE CLOTH 2 % EX PADS
6.0000 | MEDICATED_PAD | Freq: Every day | CUTANEOUS | Status: DC
  Administered 2022-12-15: 6 via TOPICAL

## 2022-12-14 MED ORDER — ALBUTEROL SULFATE (2.5 MG/3ML) 0.083% IN NEBU: 2.5000 mg | INHALATION_SOLUTION | RESPIRATORY_TRACT | Status: DC | PRN

## 2022-12-14 MED ORDER — ONDANSETRON HCL 4 MG PO TABS: 4.0000 mg | ORAL_TABLET | Freq: Four times a day (QID) | ORAL | Status: DC | PRN

## 2022-12-14 MED ORDER — BUPRENORPHINE 7.5 MCG/HR TD PTWK: 1.0000 | MEDICATED_PATCH | TRANSDERMAL | Status: DC

## 2022-12-14 MED ORDER — ACETAMINOPHEN 325 MG PO TABS
650.0000 mg | ORAL_TABLET | Freq: Four times a day (QID) | ORAL | Status: DC | PRN
  Administered 2022-12-18: 650 mg via ORAL

## 2022-12-14 MED ORDER — ALBUTEROL SULFATE HFA 108 (90 BASE) MCG/ACT IN AERS: 2.0000 | INHALATION_SPRAY | RESPIRATORY_TRACT | Status: DC | PRN

## 2022-12-14 MED ORDER — OXYCODONE HCL 5 MG PO TABS
5.0000 mg | ORAL_TABLET | ORAL | Status: DC | PRN
  Administered 2022-12-14 – 2022-12-19 (×10): 5 mg via ORAL
  Filled 2022-12-14 (×10): qty 1

## 2022-12-14 MED ORDER — CLONIDINE HCL 0.1 MG PO TABS
0.1000 mg | ORAL_TABLET | Freq: Two times a day (BID) | ORAL | Status: DC
  Administered 2022-12-14 – 2022-12-19 (×12): 0.1 mg via ORAL
  Filled 2022-12-14 (×13): qty 1

## 2022-12-14 MED ORDER — VANCOMYCIN HCL 500 MG/100ML IV SOLN
500.0000 mg | INTRAVENOUS | Status: DC
  Administered 2022-12-18: 500 mg via INTRAVENOUS
  Filled 2022-12-14 (×3): qty 100

## 2022-12-14 MED ORDER — LEVETIRACETAM 500 MG PO TABS
1000.0000 mg | ORAL_TABLET | Freq: Every evening | ORAL | Status: DC
  Administered 2022-12-14 – 2022-12-19 (×6): 1000 mg via ORAL
  Filled 2022-12-14 (×6): qty 2

## 2022-12-14 MED ORDER — IRBESARTAN 150 MG PO TABS
150.0000 mg | ORAL_TABLET | Freq: Every day | ORAL | Status: DC
  Administered 2022-12-14 – 2022-12-19 (×6): 150 mg via ORAL
  Filled 2022-12-14 (×6): qty 1

## 2022-12-14 MED ORDER — AMLODIPINE BESYLATE 5 MG PO TABS: 5.0000 mg | ORAL_TABLET | ORAL | Status: DC

## 2022-12-14 MED ORDER — CARVEDILOL 3.125 MG PO TABS
6.2500 mg | ORAL_TABLET | Freq: Two times a day (BID) | ORAL | Status: DC
  Administered 2022-12-14 – 2022-12-19 (×11): 6.25 mg via ORAL
  Filled 2022-12-14 (×12): qty 2

## 2022-12-14 MED ORDER — ACETAMINOPHEN 650 MG RE SUPP: 650.0000 mg | Freq: Four times a day (QID) | RECTAL | Status: DC | PRN

## 2022-12-14 MED ORDER — PANTOPRAZOLE SODIUM 40 MG PO TBEC
40.0000 mg | DELAYED_RELEASE_TABLET | Freq: Every day | ORAL | Status: DC
  Administered 2022-12-14: 40 mg via ORAL
  Filled 2022-12-14: qty 1

## 2022-12-14 NOTE — ED Provider Notes (Signed)
[facility] Provider Note  CSN: [id number] Arrival date & time: 12/13/22 2100  Chief Complaint(s) Fever (diarrhea)  HPI [name] is a 57 y.o. female with PMH paraplegia with suprapubic catheter in place, seizure disorder, sacral decubitus ulcer, ESRD on hemodialysis who presents emergency department for evaluation of fever, chills and diarrhea.  Symptoms began this morning and patient did not go to hemodialysis today because of feeling ill.  Endorses brown watery diarrhea with no blood.  Denies chest pain, shortness of breath, headache or other systemic symptoms.  Patient arrives febrile and tachycardic meeting sepsis criteria.   Past Medical History Past Medical History:  Diagnosis Date   Abnormal uterine bleeding (AUB) 06/15/2014   Cancer (HCC)    uterine   High blood pressure    Paraplegia (lower)    Seizure disorder (HCC)    Seizures (HCC)    Suprapubic catheter (HCC)    Urinary tract infection    Patient Active Problem List   Diagnosis Date Noted   Chronic pain 12/14/2022   Bacteremia 11/08/2022   Loss of weight 10/26/2022   N&V (nausea and vomiting) 10/26/2022   Cellulitis of knee, left 07/24/2022   Infected decubitus ulcer 07/23/2022   Osteomyelitis of multiple sites-chronic     Chronic anemia 02/25/2022   ESRD (end stage renal disease) (HCC) 02/25/2022   Abscess of buttock, right 08/25/2021   GERD (gastroesophageal reflux disease) 08/25/2021   Anxiety 08/25/2021   Decubitus ulcers 08/25/2021   Anemia 08/24/2021   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 chronic kidney disease (HCC) 04/25/2017   AKI (acute kidney injury) (HCC) 04/25/2017   Dehydration 04/25/2017   Metabolic acidosis 04/25/2017   Altered mental status 06/22/2016   Altered mental state 08/16/2015   Pressure ulcer 05/16/2015   Acute encephalopathy 05/15/2015   Malnutrition (HCC)    Encephalopathy acute 10/10/2014   Sepsis (HCC) 10/10/2014   Left lower lobe pneumonia 10/10/2014    Encephalomalacia 10/10/2014   TBI (traumatic brain injury) (HCC) 10/10/2014   Severe protein-calorie malnutrition (HCC) 10/10/2014   UTI (urinary tract infection) 08/26/2014   Hypokalemia 08/26/2014   Abnormal uterine bleeding (AUB) 06/15/2014   Essential hypertension    Seizure disorder (HCC)    Status epilepticus (HCC) 05/02/2014   Hypertensive emergency without congestive heart failure 05/02/2014   Acute renal failure (HCC) 05/21/2013   Hyperkalemia 05/21/2013   Hypothermia 05/21/2013   Paraplegia (HCC) 06/25/2012   Suprapubic catheter (HCC) 06/25/2012   Decubitus ulcer of buttock 06/25/2012   Home Medication(s) Prior to Admission medications   Medication Sig Start Date End Date Taking? Authorizing Provider  acetaminophen (TYLENOL) 500 MG tablet Take 1,000 mg by mouth every 6 (six) hours as needed for moderate pain.    [provider]  albuterol (VENTOLIN HFA) 108 (90 Base) MCG/ACT inhaler Inhale 2 puffs into the lungs every 4 (four) hours as needed for wheezing or shortness of breath. 11/12/22   Shon Hale, MD  amLODipine (NORVASC) 5 MG tablet Take 1 tablet (5 mg total) by mouth daily. Take 1 tablet on Monday,Wednesday and Friday then take 2 tablets on Tuesday,Thursday,Saturday and Sunday. Patient taking differently: Take 5 mg by mouth See admin instructions. Take 1 tablet on Monday,Wednesday and Friday then take 2 tablets on Tuesday,Thursday,Saturday and Sunday. 11/12/22   Shon Hale, MD  B Complex-C-Folic Acid (RENA-VITE RX PO) Take 1 tablet by mouth every morning.    [provider]  buprenorphine (BUTRANS) 7.5 MCG/HR Place 1 patch onto the skin once  a week. 10/03/22   [provider]  carvedilol (COREG) 6.25 MG tablet Take 6.25 mg by mouth 2 (two) times daily with a meal.    [provider]  cloNIDine (CATAPRES) 0.1 MG tablet Take 1 tablet (0.1 mg total) by mouth 2 (two) times daily. 11/12/22   Shon Hale, MD  furosemide (LASIX) 20  MG tablet Take 20 mg by mouth daily. 08/14/21   [provider]  irbesartan (AVAPRO) 150 MG tablet Take 150 mg by mouth daily at 6 PM.    [provider]  levETIRAcetam (KEPPRA) 500 MG tablet Take 1,000 mg by mouth every evening. Take an additional tablet on Monday,Wednesday and Friday evening after Dialysis 08/09/21   [provider]  lidocaine-prilocaine (EMLA) cream Apply 1 Application topically 3 (three) times a week. 06/04/22   [provider]  methocarbamol (ROBAXIN) 500 MG tablet Take 500 mg by mouth daily as needed (pain in ribs).    [provider]  naloxone [facility]) nasal spray 4 mg/0.1 mL Place 0.4 mg into the nose once. 10/03/22   [provider]  pantoprazole (PROTONIX) 40 MG tablet Take 40 mg by mouth daily. 08/03/22   [provider]  senna-docusate (SENOKOT-S) 8.6-50 MG tablet Take 2 tablets by mouth at bedtime. 11/12/22   Shon Hale, MD                                                                                                                                    Past Surgical History Past Surgical History:  Procedure Laterality Date   APPLICATION OF WOUND VAC Right 09/13/2021   (approximately 1-52mos ago) pressure sore on right hip   BACK SURGERY     Pt stated "before 2000"   BIOPSY  12/03/2022   Procedure: BIOPSY;  Surgeon: Dolores Frame, MD;  Location: [facility];  Service: Gastroenterology;;   ESOPHAGOGASTRODUODENOSCOPY N/A 09/20/2015   Procedure: ESOPHAGOGASTRODUODENOSCOPY (EGD);  Surgeon: Malissa Hippo, MD;  Location: [facility];  Service: Endoscopy;  Laterality: N/A;  730   ESOPHAGOGASTRODUODENOSCOPY (EGD) WITH PROPOFOL N/A 12/03/2022   Procedure: ESOPHAGOGASTRODUODENOSCOPY (EGD) WITH PROPOFOL;  Surgeon: Dolores Frame, MD;  Location: [facility];  Service: Gastroenterology;  Laterality: N/A;  1:15 pm, asa 3, pt knows to arrive at 10:30  dialysis pt, M,W & F   IR CATHETER TUBE  CHANGE  04/02/2018   PERCUTANEOUS ENDOSCOPIC GASTROSTOMY (PEG) REMOVAL N/A 09/20/2015   Procedure: PERCUTANEOUS ENDOSCOPIC GASTROSTOMY (PEG) REMOVAL;  Surgeon: Malissa Hippo, MD;  Location: [facility];  Service: Endoscopy;  Laterality: N/A;   TEE WITHOUT CARDIOVERSION N/A 11/11/2022   Procedure: TRANSESOPHAGEAL ECHOCARDIOGRAM (TEE);  Surgeon: Pricilla Riffle, MD;  Location: [facility];  Service: Cardiovascular;  Laterality: N/A;   Family History Family History  Problem Relation Age of Onset   Cancer Mother    Hypertension Mother    Cancer Sister  breast and then spread everywhere.   Diabetes Paternal Grandmother    Hypertension Paternal Grandmother     Social History Social History   Tobacco Use   Smoking status: Never   Smokeless tobacco: Never  Vaping Use   Vaping status: Never Used  Substance Use Topics   Alcohol use: Yes    Comment: Occassionally   Drug use: No   Allergies Benadryl [diphenhydramine hcl (sleep)], Daptomycin, Linezolid, Moxifloxacin, Quinine derivatives, Vancomycin, Azithromycin, Tetracycline, and Zosyn [piperacillin sod-tazobactam so]  Review of Systems Review of Systems  Constitutional:  Positive for chills and fever.  Gastrointestinal:  Positive for diarrhea.  Skin:  Positive for wound.    Physical Exam Vital Signs  I have reviewed the triage vital signs BP (!) 156/71   Pulse 69   Temp 98.2 F (36.8 C) (Oral)   Resp 19   Ht 5\' 3"  (1.6 m)   Wt 45 kg   LMP  (LMP Unknown)   SpO2 95%   BMI 17.57 kg/m   Physical Exam Vitals and nursing note reviewed.  Constitutional:      General: She is not in acute distress.    Appearance: She is well-developed. She is ill-appearing.  HENT:     Head: Normocephalic and atraumatic.  Eyes:     Conjunctiva/sclera: Conjunctivae normal.  Cardiovascular:     Rate and Rhythm: Regular rhythm. Tachycardia present.     Heart sounds: No murmur heard. Pulmonary:     Effort: Pulmonary effort is normal. No  respiratory distress.  Musculoskeletal:        General: No swelling.     Cervical back: Neck supple.  Skin:    General: Skin is warm and dry.     Capillary Refill: Capillary refill takes less than 2 seconds.     Findings: Lesion present.  Neurological:     Mental Status: She is alert.  Psychiatric:        Mood and Affect: Mood normal.     ED Results and Treatments Labs (all labs ordered are listed, but only abnormal results are displayed) Labs Reviewed  COMPREHENSIVE METABOLIC PANEL - Abnormal; Notable for the following components:      Result Value   Sodium 134 (*)    Glucose, Bld 128 (*)    BUN 44 (*)    Creatinine, Ser 5.86 (*)    Calcium 7.9 (*)    Albumin 2.8 (*)    GFR, Estimated 8 (*)    All other components within normal limits  CBC WITH DIFFERENTIAL/PLATELET - Abnormal; Notable for the following components:   WBC 11.3 (*)    RBC 3.54 (*)    Hemoglobin 10.2 (*)    HCT 34.5 (*)    MCHC 29.6 (*)    RDW 17.2 (*)    Neutro Abs 9.9 (*)    All other components within normal limits  PROTIME-INR - Abnormal; Notable for the following components:   Prothrombin Time 15.6 (*)    All other components within normal limits  URINALYSIS, W/ REFLEX TO CULTURE (INFECTION SUSPECTED) - Abnormal; Notable for the following components:   APPearance CLOUDY (*)    Glucose, UA 150 (*)    Hgb urine dipstick SMALL (*)    Protein, ur >=300 (*)    Leukocytes,Ua TRACE (*)    Bacteria, UA FEW (*)    All other components within normal limits  SEDIMENTATION RATE - Abnormal; Notable for the following components:   Sed Rate 55 (*)  All other components within normal limits  PROTIME-INR - Abnormal; Notable for the following components:   Prothrombin Time 16.0 (*)    INR 1.3 (*)    All other components within normal limits  COMPREHENSIVE METABOLIC PANEL - Abnormal; Notable for the following components:   CO2 20 (*)    Glucose, Bld 118 (*)    BUN 52 (*)    Creatinine, Ser 6.12 (*)     Calcium 8.0 (*)    Total Protein 6.3 (*)    Albumin 2.5 (*)    GFR, Estimated 8 (*)    All other components within normal limits  CBC WITH DIFFERENTIAL/PLATELET - Abnormal; Notable for the following components:   RBC 3.04 (*)    Hemoglobin 9.0 (*)    HCT 29.9 (*)    RDW 17.2 (*)    Platelets 147 (*)    Neutro Abs 8.2 (*)    All other components within normal limits  PHOSPHORUS - Abnormal; Notable for the following components:   Phosphorus 5.2 (*)    All other components within normal limits  CULTURE, BLOOD (ROUTINE X 2)  CULTURE, BLOOD (ROUTINE X 2)  MRSA NEXT GEN BY PCR, NASAL  URINE CULTURE  LACTIC ACID, PLASMA  LACTIC ACID, PLASMA  PROCALCITONIN  MAGNESIUM  HIV ANTIBODY (ROUTINE TESTING W REFLEX)  C-REACTIVE PROTEIN  CORTISOL-AM, BLOOD  HEPATITIS B SURFACE ANTIGEN  HEPATITIS B SURFACE ANTIBODY, QUANTITATIVE                                                                                                                          Radiology CT CHEST ABDOMEN PELVIS WO CONTRAST  Result Date: 12/13/2022 CLINICAL DATA:  Sepsis.  Fever and diarrhea. EXAM: CT CHEST, ABDOMEN AND PELVIS WITHOUT CONTRAST TECHNIQUE: Multidetector CT imaging of the chest, abdomen and pelvis was performed following the standard protocol without IV contrast. RADIATION DOSE REDUCTION: This exam was performed according to the departmental dose-optimization program which includes automated exposure control, adjustment of the mA and/or kV according to patient size and/or use of iterative reconstruction technique. COMPARISON:  11/10/2022. FINDINGS: CT CHEST FINDINGS Cardiovascular: The heart is enlarged and there is a small pericardial effusion. A few coronary artery calcifications are noted. There is atherosclerotic calcification of the aorta without evidence of aneurysm. The pulmonary trunk is distended suggesting underlying pulmonary artery hypertension. Mediastinum/Nodes: No mediastinal or axillary lymphadenopathy.  Evaluation of the hila is limited due to lack of IV contrast. Thyroid gland, trachea, and esophagus are within normal limits. Lungs/Pleura: Small pleural effusion is present on the right. Patchy atelectasis or infiltrate is noted in the in the lungs bilaterally. No pneumothorax. Musculoskeletal: Degenerative changes and fusion hardware are noted in the thoracic spine. There is bony deformity of the proximal sternum likely related to old trauma. There stable anterolisthesis at T7-T8. No acute osseous abnormality. CT ABDOMEN PELVIS FINDINGS Hepatobiliary: No focal liver abnormality is seen. No gallstones, gallbladder wall thickening, or biliary dilatation. Pancreas: Unremarkable. No  pancreatic ductal dilatation or surrounding inflammatory changes. Spleen: Normal in size without focal abnormality. Adrenals/Urinary Tract: The adrenal glands are within normal limits. Renal atrophy is noted bilaterally. No renal calculus or hydronephrosis. A suprapubic catheter is noted in the urinary bladder. There is diffuse bladder wall thickening. Stomach/Bowel: Stomach is within normal limits. Appendix is not seen. No evidence of bowel wall thickening, distention, or inflammatory changes. No free air or pneumatosis. Vascular/Lymphatic: Aortic atherosclerosis. No enlarged abdominal or pelvic lymph nodes. Reproductive: Uterus and bilateral adnexa are unremarkable. Other: No abdominopelvic ascites. Skin defects and soft tissue thickening are noted in the ischial regions bilaterally and over the left hip. No obvious abscess is seen. A 5.7 x 2.4 cm hypodense collection with calcifications is noted posterior to the sacrum on the right, increased in size from the prior exam. Musculoskeletal: Degenerative changes are present in the lumbar spine. No acute osseous abnormality is seen. A stable joint effusion is noted at the right hip. IMPRESSION: 1. Small right pleural effusion with patchy atelectasis or infiltrates in the lungs bilaterally.  2. Cardiomegaly with small pericardial effusion. 3. Diffuse bladder wall thickening with suprapubic catheter in place, possible infectious or inflammatory cystitis. 4. Bilateral renal atrophy. 5. Decubitus ulcers over the left hip, and ischial tuberosities. Collection containing calcifications posterior to the sacrum and coccyx, increased in size from the prior exam. Consider MRI for further evaluation if there is clinical concern for osteomyelitis. Electronically Signed   By: Thornell Sartorius M.D.   On: 12/13/2022 22:27    Pertinent labs & imaging results that were available during my care of the patient were reviewed by me and considered in my medical decision making (see MDM for details).  Medications Ordered in ED Medications  buprenorphine (BUTRANS) 7.5 MCG/HR 1 patch (has no administration in time range)  amLODipine (NORVASC) tablet 5 mg (has no administration in time range)  carvedilol (COREG) tablet 6.25 mg (6.25 mg Oral Given 12/14/22 0902)  cloNIDine (CATAPRES) tablet 0.1 mg (0.1 mg Oral Given 12/14/22 0902)  irbesartan (AVAPRO) tablet 150 mg (has no administration in time range)  pantoprazole (PROTONIX) EC tablet 40 mg (40 mg Oral Given 12/14/22 0901)  levETIRAcetam (KEPPRA) tablet 1,000 mg (has no administration in time range)  methocarbamol (ROBAXIN) tablet 500 mg (has no administration in time range)  heparin injection 5,000 Units (5,000 Units Subcutaneous Given 12/14/22 0530)  acetaminophen (TYLENOL) tablet 650 mg (has no administration in time range)    Or  acetaminophen (TYLENOL) suppository 650 mg (has no administration in time range)  oxyCODONE (Oxy IR/ROXICODONE) immediate release tablet 5 mg (5 mg Oral Given 12/14/22 0901)  ondansetron (ZOFRAN) tablet 4 mg (has no administration in time range)    Or  ondansetron (ZOFRAN) injection 4 mg (has no administration in time range)  Chlorhexidine Gluconate Cloth 2 % PADS 6 each (6 each Topical Given 12/14/22 0902)  amLODipine (NORVASC)  tablet 10 mg (10 mg Oral Given 12/14/22 0901)  albuterol (PROVENTIL) (2.5 MG/3ML) 0.083% nebulizer solution 2.5 mg (has no administration in time range)  vancomycin (VANCOREADY) IVPB 500 mg/100 mL (has no administration in time range)  Chlorhexidine Gluconate Cloth 2 % PADS 6 each (has no administration in time range)  ceFEPIme (MAXIPIME) 2 g in sodium chloride 0.9 % 100 mL IVPB (0 g Intravenous Stopped 12/13/22 2315)  vancomycin (VANCOREADY) IVPB 750 mg/150 mL (0 mg Intravenous Stopped 12/13/22 2335)  acetaminophen (TYLENOL) tablet 1,000 mg (1,000 mg Oral Given 12/13/22 2247)  ketorolac (TORADOL) 15 MG/ML injection  15 mg (15 mg Intravenous Given 12/14/22 0119)                                                                                                                                     Procedures .Critical Care  Performed by: Glendora Score, MD Authorized by: Glendora Score, MD   Critical care provider statement:    Critical care time (minutes):  30   Critical care was necessary to treat or prevent imminent or life-threatening deterioration of the following conditions:  Sepsis   Critical care was time spent personally by me on the following activities:  Development of treatment plan with patient or surrogate, discussions with consultants, evaluation of patient's response to treatment, examination of patient, ordering and review of laboratory studies, ordering and review of radiographic studies, ordering and performing treatments and interventions, pulse oximetry, re-evaluation of patient's condition and review of old charts   (including critical care time)  Medical Decision Making / ED Course   This patient presents to the ED for concern of fever, diarrhea, this involves an extensive number of treatment options, and is a complaint that carries with it a high risk of complications and morbidity.  The differential diagnosis includes bacteremia, sepsis, infectious diarrhea, intra-abdominal  infection, sacral wound infection, electrolyte abnormality, thyroid storm  MDM: Patient seen emergency room for evaluation of fever and diarrhea.  Physical exam with foul-smelling sacral wound with minimal drainage, baseline paraplegia and regular tachycardia.  Physical exam otherwise unremarkable.  Patient meets sepsis criteria on arrival and broad-spectrum antibiotics initiated immediately.  Laboratory evaluation with a BUN of 44, creatinine 5.86 consistent with her history of ESRD on hemodialysis, leukocytosis to 11.3, hemoglobin 10.2, urinalysis with trace leuk esterase, greater than 50 red blood cells, 21-50 white blood cells, few bacteria but the sample was dirty with 21-50 squamous epithelial cells.  CT chest abdomen pelvis with small right pleural effusion, cardiomegaly with a small pericardial effusion, diffuse bladder wall thickening with a suprapubic catheter, multiple decubitus ulcers over the left hip and ischial tuberosities that are increasing in size.  Patient require hospital admission for suspected sepsis secondary to a wound infection.  Patient then admitted.   Additional history obtained:  -External records from outside source obtained and reviewed including: Chart review including previous notes, labs, imaging, consultation notes   Lab Tests: -I ordered, reviewed, and interpreted labs.   The pertinent results include:   Labs Reviewed  COMPREHENSIVE METABOLIC PANEL - Abnormal; Notable for the following components:      Result Value   Sodium 134 (*)    Glucose, Bld 128 (*)    BUN 44 (*)    Creatinine, Ser 5.86 (*)    Calcium 7.9 (*)    Albumin 2.8 (*)    GFR, Estimated 8 (*)    All other components within normal limits  CBC WITH DIFFERENTIAL/PLATELET - Abnormal; Notable for the following components:   WBC  11.3 (*)    RBC 3.54 (*)    Hemoglobin 10.2 (*)    HCT 34.5 (*)    MCHC 29.6 (*)    RDW 17.2 (*)    Neutro Abs 9.9 (*)    All other components within normal  limits  PROTIME-INR - Abnormal; Notable for the following components:   Prothrombin Time 15.6 (*)    All other components within normal limits  URINALYSIS, W/ REFLEX TO CULTURE (INFECTION SUSPECTED) - Abnormal; Notable for the following components:   APPearance CLOUDY (*)    Glucose, UA 150 (*)    Hgb urine dipstick SMALL (*)    Protein, ur >=300 (*)    Leukocytes,Ua TRACE (*)    Bacteria, UA FEW (*)    All other components within normal limits  SEDIMENTATION RATE - Abnormal; Notable for the following components:   Sed Rate 55 (*)    All other components within normal limits  PROTIME-INR - Abnormal; Notable for the following components:   Prothrombin Time 16.0 (*)    INR 1.3 (*)    All other components within normal limits  COMPREHENSIVE METABOLIC PANEL - Abnormal; Notable for the following components:   CO2 20 (*)    Glucose, Bld 118 (*)    BUN 52 (*)    Creatinine, Ser 6.12 (*)    Calcium 8.0 (*)    Total Protein 6.3 (*)    Albumin 2.5 (*)    GFR, Estimated 8 (*)    All other components within normal limits  CBC WITH DIFFERENTIAL/PLATELET - Abnormal; Notable for the following components:   RBC 3.04 (*)    Hemoglobin 9.0 (*)    HCT 29.9 (*)    RDW 17.2 (*)    Platelets 147 (*)    Neutro Abs 8.2 (*)    All other components within normal limits  PHOSPHORUS - Abnormal; Notable for the following components:   Phosphorus 5.2 (*)    All other components within normal limits  CULTURE, BLOOD (ROUTINE X 2)  CULTURE, BLOOD (ROUTINE X 2)  MRSA NEXT GEN BY PCR, NASAL  URINE CULTURE  LACTIC ACID, PLASMA  LACTIC ACID, PLASMA  PROCALCITONIN  MAGNESIUM  HIV ANTIBODY (ROUTINE TESTING W REFLEX)  C-REACTIVE PROTEIN  CORTISOL-AM, BLOOD  HEPATITIS B SURFACE ANTIGEN  HEPATITIS B SURFACE ANTIBODY, QUANTITATIVE      EKG   EKG Interpretation Date/Time:  Friday December 13 2022 21:07:03 EDT Ventricular Rate:  102 PR Interval:  160 QRS Duration:  103 QT Interval:  343 QTC  Calculation: 447 R Axis:   -70  Text Interpretation: Sinus tachycardia Probable left atrial enlargement Incomplete RBBB and LAFB Confirmed by Bristal Steffy ([id number]) on 12/14/2022 12:39:53 PM         Imaging Studies ordered: I ordered imaging studies including CT chest abdomen pelvis I independently visualized and interpreted imaging. I agree with the radiologist interpretation   Medicines ordered and prescription drug management: Meds ordered this encounter  Medications   ceFEPIme (MAXIPIME) 2 g in sodium chloride 0.9 % 100 mL IVPB   vancomycin (VANCOREADY) IVPB 750 mg/150 mL    Order Specific Question:   Indication:    Answer:   Sepsis   acetaminophen (TYLENOL) tablet 1,000 mg   ketorolac (TORADOL) 15 MG/ML injection 15 mg   buprenorphine (BUTRANS) 7.5 MCG/HR 1 patch   amLODipine (NORVASC) tablet 5 mg    Take 1 tablet on Monday,Wednesday and Friday then take 2 tablets on Tuesday,Thursday,Saturday and Sunday.  carvedilol (COREG) tablet 6.25 mg   cloNIDine (CATAPRES) tablet 0.1 mg   irbesartan (AVAPRO) tablet 150 mg   pantoprazole (PROTONIX) EC tablet 40 mg   levETIRAcetam (KEPPRA) tablet 1,000 mg    Take an additional tablet on Monday,Wednesday and Friday evening after Dialysis     methocarbamol (ROBAXIN) tablet 500 mg   DISCONTD: albuterol (VENTOLIN HFA) 108 (90 Base) MCG/ACT inhaler 2 puff   heparin injection 5,000 Units   OR Linked Order Group    acetaminophen (TYLENOL) tablet 650 mg    acetaminophen (TYLENOL) suppository 650 mg   oxyCODONE (Oxy IR/ROXICODONE) immediate release tablet 5 mg   OR Linked Order Group    ondansetron (ZOFRAN) tablet 4 mg    ondansetron (ZOFRAN) injection 4 mg   Chlorhexidine Gluconate Cloth 2 % PADS 6 each   amLODipine (NORVASC) tablet 10 mg   albuterol (PROVENTIL) (2.5 MG/3ML) 0.083% nebulizer solution 2.5 mg   vancomycin (VANCOREADY) IVPB 500 mg/100 mL    Order Specific Question:   Indication:    Answer:   Sepsis   Chlorhexidine  Gluconate Cloth 2 % PADS 6 each    -I have reviewed the patients home medicines and have made adjustments as needed  Critical interventions Broad-spectrum antibiotics, fever control    Cardiac Monitoring: The patient was maintained on a cardiac monitor.  I personally viewed and interpreted the cardiac monitored which showed an underlying rhythm of: Sinus tachycardia, NSR  Social Determinants of Health:  Factors impacting patients care include: none   Reevaluation: After the interventions noted above, I reevaluated the patient and found that they have :improved  Co morbidities that complicate the patient evaluation  Past Medical History:  Diagnosis Date   Abnormal uterine bleeding (AUB) 06/15/2014   Cancer (HCC)    uterine   High blood pressure    Paraplegia (lower)    Seizure disorder (HCC)    Seizures (HCC)    Suprapubic catheter (HCC)    Urinary tract infection       Dispostion: I considered admission for this patient, and due to the sepsis likely secondary to a infected sacral wound patient will for hospital mission     Final Clinical Impression(s) / ED Diagnoses Final diagnoses:  Wound of sacral region, initial encounter  Fever, unspecified fever cause     @PCDICTATION @    Glendora Score, MD 12/14/22 1240

## 2022-12-14 NOTE — Assessment & Plan Note (Signed)
Continue Keppra.

## 2022-12-14 NOTE — Consult Note (Addendum)
[facility] Nephrology Consult Note: Reason for Consult: To manage dialysis and dialysis related needs Referring Physician: Dr. Flossie Dibble, Guadalupe Maple  HPI:  [name] is an 57 y.o. female with past medical history significant for hypertension, paraplegia, seizure disorder, suprapubic catheter, chronic pain, ESRD on HD who presented with fever and diarrhea, seen as a consultation for the management of ESRD. Patient reported that she had fever and 1 episode of diarrhea yesterday.  She also had decubiti ulcer on that was noted to be malodorous.  In the ER, temperature was 102.9, in room air, blood pressure was elevated. The labs showed potassium 3.8, BUN 52, phosphorus 5.2, calcium 8.0 with albumin 2.5, hemoglobin 9.0, WBC 9.6. CT scan showed right pleural effusion, collection in sacral and coccyx which was increased from previous exam therefore concerning MRI to rule out osteomyelitis.  She is currently on vancomycin and Zosyn and empiric antibiotics.  For ESRD, she receives dialysis Monday Wednesday Friday at [facility] in [place].  She missed dialysis yesterday because of diarrhea.  She has left AV fistula for the access.  Reports 3 hours of treatment. Denies nausea, vomiting, chest pain, shortness of breath.  OP HD orders from last visit to hospital on 11/12/22: need updates  Dialyzes at [facility] MWF  3 hours  EDW 45.5. HD Bath 3K/2.5 calc, Dialyzer unknown, Heparin none. Access 15 gauge.400/500  200 mircera, 0.75 calcitriol  Past Medical History:  Diagnosis Date   Abnormal uterine bleeding (AUB) 06/15/2014   Cancer (HCC)    uterine   High blood pressure    Paraplegia (lower)    Seizure disorder (HCC)    Seizures (HCC)    Suprapubic catheter (HCC)    Urinary tract infection     Past Surgical History:  Procedure Laterality Date   APPLICATION OF WOUND VAC Right 09/13/2021   (approximately 1-1mos ago) pressure sore on right hip   BACK SURGERY     Pt stated  "before 2000"   BIOPSY  12/03/2022   Procedure: BIOPSY;  Surgeon: Dolores Frame, MD;  Location: [facility];  Service: Gastroenterology;;   ESOPHAGOGASTRODUODENOSCOPY N/A 09/20/2015   Procedure: ESOPHAGOGASTRODUODENOSCOPY (EGD);  Surgeon: Malissa Hippo, MD;  Location: [facility];  Service: Endoscopy;  Laterality: N/A;  730   ESOPHAGOGASTRODUODENOSCOPY (EGD) WITH PROPOFOL N/A 12/03/2022   Procedure: ESOPHAGOGASTRODUODENOSCOPY (EGD) WITH PROPOFOL;  Surgeon: Dolores Frame, MD;  Location: [facility];  Service: Gastroenterology;  Laterality: N/A;  1:15 pm, asa 3, pt knows to arrive at 10:30  dialysis pt, M,W & F   IR CATHETER TUBE CHANGE  04/02/2018   PERCUTANEOUS ENDOSCOPIC GASTROSTOMY (PEG) REMOVAL N/A 09/20/2015   Procedure: PERCUTANEOUS ENDOSCOPIC GASTROSTOMY (PEG) REMOVAL;  Surgeon: Malissa Hippo, MD;  Location: [facility];  Service: Endoscopy;  Laterality: N/A;   TEE WITHOUT CARDIOVERSION N/A 11/11/2022   Procedure: TRANSESOPHAGEAL ECHOCARDIOGRAM (TEE);  Surgeon: Pricilla Riffle, MD;  Location: [facility];  Service: Cardiovascular;  Laterality: N/A;    Family History  Problem Relation Age of Onset   Cancer Mother    Hypertension Mother    Cancer Sister        breast and then spread everywhere.   Diabetes Paternal Grandmother    Hypertension Paternal Grandmother     Social History:  reports that she has never smoked. She has never used smokeless tobacco. She reports current alcohol use. She reports that she does not use drugs.  Allergies:  Allergies  Allergen Reactions   Benadryl [Diphenhydramine  Hcl (Sleep)] Hives   Daptomycin Hives   Linezolid     Other Reaction(s): GI Intolerance  Patient self-discontinued treatment due to GI intolerance. Taking it along with moxifloxacin   Moxifloxacin     Other Reaction(s): GI Intolerance  Patient self-discontinued treatment due to GI intolerance. Taking it along with linezolid   Quinine Derivatives Other (See  Comments)    Alters mental status   Vancomycin     Pt is tolerating this medication at HD   Azithromycin Itching and Rash   Tetracycline Itching    Able to tolerate Doxycycline.    Zosyn [Piperacillin Sod-Tazobactam So] Rash    Medications: I have reviewed the patient's current medications.   Results for orders placed or performed during the hospital encounter of 12/13/22 (from the past 48 hour(s))  Comprehensive metabolic panel     Status: Abnormal   Collection Time: 12/13/22  9:00 PM  Result Value Ref Range   Sodium 134 (L) 135 - 145 mmol/L   Potassium 4.2 3.5 - 5.1 mmol/L   Chloride 100 98 - 111 mmol/L   CO2 22 22 - 32 mmol/L   Glucose, Bld 128 (H) 70 - 99 mg/dL    Comment: Glucose reference range applies only to samples taken after fasting for at least 8 hours.   BUN 44 (H) 6 - 20 mg/dL   Creatinine, Ser 0.86 (H) 0.44 - 1.00 mg/dL   Calcium 7.9 (L) 8.9 - 10.3 mg/dL   Total Protein 7.2 6.5 - 8.1 g/dL   Albumin 2.8 (L) 3.5 - 5.0 g/dL   AST 19 15 - 41 U/L   ALT 13 0 - 44 U/L   Alkaline Phosphatase 80 38 - 126 U/L   Total Bilirubin 0.8 0.3 - 1.2 mg/dL   GFR, Estimated 8 (L) >60 mL/min    Comment: (NOTE) Calculated using the CKD-EPI Creatinine Equation (2021)    Anion gap 12 5 - 15    Comment: Performed at [facility], [address], [place], [place] [address]  Lactic acid, plasma     Status: None   Collection Time: 12/13/22  9:00 PM  Result Value Ref Range   Lactic Acid, Venous 1.3 0.5 - 1.9 mmol/L    Comment: Performed at [facility], [address], [place], [place] [address]  CBC with Differential     Status: Abnormal   Collection Time: 12/13/22  9:00 PM  Result Value Ref Range   WBC 11.3 (H) 4.0 - 10.5 K/uL   RBC 3.54 (L) 3.87 - 5.11 MIL/uL   Hemoglobin 10.2 (L) 12.0 - 15.0 g/dL   HCT 28.4 (L) 13.2 - 44.0 %   MCV 97.5 80.0 - 100.0 fL   MCH 28.8 26.0 - 34.0 pg   MCHC 29.6 (L) 30.0 - 36.0 g/dL   RDW 10.2 (H) 72.5 - 36.6 %   Platelets 169 150 - 400 K/uL   nRBC  0.0 0.0 - 0.2 %   Neutrophils Relative % 87 %   Neutro Abs 9.9 (H) 1.7 - 7.7 K/uL   Lymphocytes Relative 8 %   Lymphs Abs 0.9 0.7 - 4.0 K/uL   Monocytes Relative 5 %   Monocytes Absolute 0.5 0.1 - 1.0 K/uL   Eosinophils Relative 0 %   Eosinophils Absolute 0.0 0.0 - 0.5 K/uL   Basophils Relative 0 %   Basophils Absolute 0.0 0.0 - 0.1 K/uL   Immature Granulocytes 0 %   Abs Immature Granulocytes 0.05 0.00 - 0.07 K/uL  Comment: Performed at [facility], [address], [place], [place] [address]  Protime-INR     Status: Abnormal   Collection Time: 12/13/22  9:00 PM  Result Value Ref Range   Prothrombin Time 15.6 (H) 11.4 - 15.2 seconds   INR 1.2 0.8 - 1.2    Comment: (NOTE) INR goal varies based on device and disease states. Performed at [facility], [address], [place], [place] [address]   Sedimentation rate     Status: Abnormal   Collection Time: 12/13/22  9:00 PM  Result Value Ref Range   Sed Rate 55 (H) 0 - 22 mm/hr    Comment: Performed at [facility], [address], [place], [place] [address]  Urinalysis, w/ Reflex to Culture (Infection Suspected) -Urine, Suprapubic     Status: Abnormal   Collection Time: 12/13/22  9:15 PM  Result Value Ref Range   Specimen Source URINE, CATHETERIZED    Color, Urine YELLOW YELLOW   APPearance CLOUDY (A) CLEAR   Specific Gravity, Urine 1.009 1.005 - 1.030   pH 8.0 5.0 - 8.0   Glucose, UA 150 (A) NEGATIVE mg/dL   Hgb urine dipstick SMALL (A) NEGATIVE   Bilirubin Urine NEGATIVE NEGATIVE   Ketones, ur NEGATIVE NEGATIVE mg/dL   Protein, ur >=578 (A) NEGATIVE mg/dL   Nitrite NEGATIVE NEGATIVE   Leukocytes,Ua TRACE (A) NEGATIVE   RBC / HPF >50 0 - 5 RBC/hpf   WBC, UA 21-50 0 - 5 WBC/hpf    Comment:        Reflex urine culture not performed if WBC <=10, OR if Squamous epithelial cells >5. If Squamous epithelial cells >5 suggest recollection.    Bacteria, UA FEW (A) NONE SEEN   Squamous Epithelial / HPF 21-50 0 - 5 /HPF   WBC  Clumps PRESENT    Mucus PRESENT     Comment: Performed at [facility], [address] [place], [place] [address]  Culture, blood (Routine x 2)     Status: None (Preliminary result)   Collection Time: 12/13/22  9:29 PM   Specimen: BLOOD RIGHT HAND  Result Value Ref Range   Specimen Description BLOOD RIGHT HAND    Special Requests      BOTTLES DRAWN AEROBIC AND ANAEROBIC Blood Culture adequate volume   Culture      NO GROWTH < 12 HOURS Performed at [facility], [address] [place], [place] [address]    Report Status PENDING   Culture, blood (Routine x 2)     Status: None (Preliminary result)   Collection Time: 12/13/22  9:36 PM   Specimen: Right Antecubital; Blood  Result Value Ref Range   Specimen Description RIGHT ANTECUBITAL    Special Requests      BOTTLES DRAWN AEROBIC AND ANAEROBIC Blood Culture results may not be optimal due to an excessive volume of blood received in culture bottles   Culture      NO GROWTH < 12 HOURS Performed at [facility], [address], [place], [place] [address]    Report Status PENDING   Lactic acid, plasma     Status: None   Collection Time: 12/13/22 10:40 PM  Result Value Ref Range   Lactic Acid, Venous 1.0 0.5 - 1.9 mmol/L    Comment: Performed at [facility], [address], [place], [place] [address]  Protime-INR     Status: Abnormal   Collection Time: 12/14/22  4:45 AM  Result Value Ref Range   Prothrombin Time 16.0 (H)  11.4 - 15.2 seconds   INR 1.3 (H) 0.8 - 1.2    Comment: (NOTE) INR goal varies based on device and disease states. Performed at [facility], [address], [place], [place] [address]   Procalcitonin     Status: None   Collection Time: 12/14/22  4:45 AM  Result Value Ref Range   Procalcitonin 2.21 ng/mL    Comment:        Interpretation: PCT > 2 ng/mL: Systemic infection (sepsis) is likely, unless other causes are known. (NOTE)       Sepsis PCT Algorithm           Lower Respiratory Tract                                       Infection PCT Algorithm    ----------------------------     ----------------------------         PCT < 0.25 ng/mL                PCT < 0.10 ng/mL          Strongly encourage             Strongly discourage   discontinuation of antibiotics    initiation of antibiotics    ----------------------------     -----------------------------       PCT 0.25 - 0.50 ng/mL            PCT 0.10 - 0.25 ng/mL               OR       >80% decrease in PCT            Discourage initiation of                                            antibiotics      Encourage discontinuation           of antibiotics    ----------------------------     -----------------------------         PCT >= 0.50 ng/mL              PCT 0.26 - 0.50 ng/mL               AND       <80% decrease in PCT              Encourage initiation of                                             antibiotics       Encourage continuation           of antibiotics    ----------------------------     -----------------------------        PCT >= 0.50 ng/mL                  PCT > 0.50 ng/mL               AND         increase in PCT                  Strongly encourage  initiation of antibiotics    Strongly encourage escalation           of antibiotics                                     -----------------------------                                           PCT <= 0.25 ng/mL                                                 OR                                        > 80% decrease in PCT                                      Discontinue / Do not initiate                                             antibiotics  Performed at [facility], [address], [place], [place] [address]   Comprehensive metabolic panel     Status: Abnormal   Collection Time: 12/14/22  4:45 AM  Result Value Ref Range   Sodium 135 135 - 145 mmol/L   Potassium 3.8 3.5 - 5.1 mmol/L   Chloride 104 98 - 111 mmol/L   CO2 20 (L) 22 - 32  mmol/L   Glucose, Bld 118 (H) 70 - 99 mg/dL    Comment: Glucose reference range applies only to samples taken after fasting for at least 8 hours.   BUN 52 (H) 6 - 20 mg/dL   Creatinine, Ser 7.42 (H) 0.44 - 1.00 mg/dL   Calcium 8.0 (L) 8.9 - 10.3 mg/dL   Total Protein 6.3 (L) 6.5 - 8.1 g/dL   Albumin 2.5 (L) 3.5 - 5.0 g/dL   AST 20 15 - 41 U/L   ALT 12 0 - 44 U/L   Alkaline Phosphatase 65 38 - 126 U/L   Total Bilirubin 1.0 0.3 - 1.2 mg/dL   GFR, Estimated 8 (L) >60 mL/min    Comment: (NOTE) Calculated using the CKD-EPI Creatinine Equation (2021)    Anion gap 11 5 - 15    Comment: Performed at [facility], [address], [place], [place] [address]  Magnesium     Status: None   Collection Time: 12/14/22  4:45 AM  Result Value Ref Range   Magnesium 2.0 1.7 - 2.4 mg/dL    Comment: Performed at [facility], [address], [place], [place] [address]  CBC with Differential/Platelet     Status: Abnormal   Collection Time: 12/14/22  4:45 AM  Result Value Ref Range   WBC 9.6 4.0 - 10.5 K/uL   RBC 3.04 (L) 3.87 - 5.11 MIL/uL   Hemoglobin 9.0 (L) 12.0 - 15.0  g/dL   HCT 21.3 (L) 08.6 - 57.8 %   MCV 98.4 80.0 - 100.0 fL   MCH 29.6 26.0 - 34.0 pg   MCHC 30.1 30.0 - 36.0 g/dL   RDW 46.9 (H) 62.9 - 52.8 %   Platelets 147 (L) 150 - 400 K/uL   nRBC 0.0 0.0 - 0.2 %   Neutrophils Relative % 86 %   Neutro Abs 8.2 (H) 1.7 - 7.7 K/uL   Lymphocytes Relative 10 %   Lymphs Abs 1.0 0.7 - 4.0 K/uL   Monocytes Relative 4 %   Monocytes Absolute 0.4 0.1 - 1.0 K/uL   Eosinophils Relative 0 %   Eosinophils Absolute 0.0 0.0 - 0.5 K/uL   Basophils Relative 0 %   Basophils Absolute 0.0 0.0 - 0.1 K/uL   Immature Granulocytes 0 %   Abs Immature Granulocytes 0.04 0.00 - 0.07 K/uL    Comment: Performed at [facility], [address], [place], [place] [address]  Phosphorus     Status: Abnormal   Collection Time: 12/14/22  4:45 AM  Result Value Ref Range   Phosphorus 5.2 (H) 2.5 - 4.6 mg/dL    Comment:  Performed at [facility], [address] [place], [place] [address]    CT CHEST ABDOMEN PELVIS WO CONTRAST  Result Date: 12/13/2022 CLINICAL DATA:  Sepsis.  Fever and diarrhea. EXAM: CT CHEST, ABDOMEN AND PELVIS WITHOUT CONTRAST TECHNIQUE: Multidetector CT imaging of the chest, abdomen and pelvis was performed following the standard protocol without IV contrast. RADIATION DOSE REDUCTION: This exam was performed according to the departmental dose-optimization program which includes automated exposure control, adjustment of the mA and/or kV according to patient size and/or use of iterative reconstruction technique. COMPARISON:  11/10/2022. FINDINGS: CT CHEST FINDINGS Cardiovascular: The heart is enlarged and there is a small pericardial effusion. A few coronary artery calcifications are noted. There is atherosclerotic calcification of the aorta without evidence of aneurysm. The pulmonary trunk is distended suggesting underlying pulmonary artery hypertension. Mediastinum/Nodes: No mediastinal or axillary lymphadenopathy. Evaluation of the hila is limited due to lack of IV contrast. Thyroid gland, trachea, and esophagus are within normal limits. Lungs/Pleura: Small pleural effusion is present on the right. Patchy atelectasis or infiltrate is noted in the in the lungs bilaterally. No pneumothorax. Musculoskeletal: Degenerative changes and fusion hardware are noted in the thoracic spine. There is bony deformity of the proximal sternum likely related to old trauma. There stable anterolisthesis at T7-T8. No acute osseous abnormality. CT ABDOMEN PELVIS FINDINGS Hepatobiliary: No focal liver abnormality is seen. No gallstones, gallbladder wall thickening, or biliary dilatation. Pancreas: Unremarkable. No pancreatic ductal dilatation or surrounding inflammatory changes. Spleen: Normal in size without focal abnormality. Adrenals/Urinary Tract: The adrenal glands are within normal limits. Renal atrophy is noted  bilaterally. No renal calculus or hydronephrosis. A suprapubic catheter is noted in the urinary bladder. There is diffuse bladder wall thickening. Stomach/Bowel: Stomach is within normal limits. Appendix is not seen. No evidence of bowel wall thickening, distention, or inflammatory changes. No free air or pneumatosis. Vascular/Lymphatic: Aortic atherosclerosis. No enlarged abdominal or pelvic lymph nodes. Reproductive: Uterus and bilateral adnexa are unremarkable. Other: No abdominopelvic ascites. Skin defects and soft tissue thickening are noted in the ischial regions bilaterally and over the left hip. No obvious abscess is seen. A 5.7 x 2.4 cm hypodense collection with calcifications is noted posterior to the sacrum on the right, increased in size from the prior exam. Musculoskeletal: Degenerative changes are present in the lumbar  spine. No acute osseous abnormality is seen. A stable joint effusion is noted at the right hip. IMPRESSION: 1. Small right pleural effusion with patchy atelectasis or infiltrates in the lungs bilaterally. 2. Cardiomegaly with small pericardial effusion. 3. Diffuse bladder wall thickening with suprapubic catheter in place, possible infectious or inflammatory cystitis. 4. Bilateral renal atrophy. 5. Decubitus ulcers over the left hip, and ischial tuberosities. Collection containing calcifications posterior to the sacrum and coccyx, increased in size from the prior exam. Consider MRI for further evaluation if there is clinical concern for osteomyelitis. Electronically Signed   By: Thornell Sartorius M.D.   On: 12/13/2022 22:27    ROS: As per H&P, rest of the systems reviewed and negative. Blood pressure (!) 156/71, pulse 69, temperature 98.2 F (36.8 C), temperature source Oral, resp. rate 19, height 5\' 3"  (1.6 m), weight 45 kg, SpO2 95%. Gen: NAD, comfortable Respiratory: Clear bilateral, no wheezing or crackle Cardiovascular: Regular rate rhythm S1-S2 normal, no rubs GI: Abdomen soft,  nontender, nondistended Extremities, no cyanosis or clubbing, no edema Skin: Decubiti ulcer reported. Neurology: Alert, awake, following commands, oriented Dialysis Access: AV fistula for dialysis. T/B+  Assessment/Plan:  # Sepsis presumably due to sacral decubiti infection: Currently on vancomycin and Zosyn and getting MRI of the pelvis by primary team.  # ESRD: MWF schedule daily she missed dialysis yesterday liters oxygen diarrhea.  Her volume status and electrolytes looks acceptable.  We will plan to do dialysis either today or tomorrow depending on staffing/dialysis nurse schedule.  # Hypertension: UF with HD, resume home medication.  # Anemia of ESRD: hemoglobin 9, need to obtain outpatient treatment record if she remains in the hospital longer.  # Metabolic Bone Disease: monitor ca, phos level. Obtain OP record on Monday.  Discussed with primary team provider, nurse.  Thank you for the consult.  Eurydice Calixto Jaynie Collins 12/14/2022, 12:01 PM

## 2022-12-14 NOTE — Assessment & Plan Note (Addendum)
-  Met sepsis criteria on admission with Temp 102.9, heart rate 106, respiratory rate 24  - Secondary to infected sacral decubitus ulcer versus UTI - POA: Buttocks decubitus-malodorous drainage - this morning the wound was cleaned, not draining dressing was in place - UA suspicious for UTI but patient does have suprapubic catheter  - Blood cultures/urine cultures - Previous blood cultures grew Enterococcus faecalis that was sensitive to vancomycin - Patient was started on vancomycin and cefepime in the ED, continue vancomycin  - CT abdomen pelvis with multiple findings including bladder wall thickening, and decubitus ulcers over left hip and ischial tuberosities with possible osteomyelitis -Lactic acid 1.3, 1.0, -Procalcitonin 2.21, - ESR is elevated at 55, CRP pending - Obtaining MRI of the pelvis to rule out myelitis (MRI not available at [facility] this weekend)  - Continue to monitor

## 2022-12-14 NOTE — Assessment & Plan Note (Signed)
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

## 2022-12-14 NOTE — Assessment & Plan Note (Signed)
Continue Protonix °

## 2022-12-14 NOTE — Assessment & Plan Note (Addendum)
-   Monday/Wednesday/Friday dialysis schedule - Did not get dialyzed on 7/19 due to diarrhea - Consult nephro for HD - Nephrology following closely -Dialysis  12/15/2022>> hemodialysis in a.m. - Continue to monitor

## 2022-12-14 NOTE — Progress Notes (Signed)
PROGRESS NOTE    Patient: [name]                            PCP: [facility] [place], [place]                    DOB: August 06, 1965            DOA: 12/13/2022 ION:[id number]             DOS: 12/14/2022, 11:38 AM   LOS: 1 day   Date of Service: The patient was [name] and examined on 12/14/2022  Subjective:   The patient was [name] and examined this morning. Hemodynamically stable. No issues overnight .  Brief Narrative:   [name] is a 57 y.o. female with medical history significant of hypertension, paraplegia, seizure disorder, suprapubic catheter, GERD, chronic pain, and more presents the ED with a chief complaint of fever and diarrhea.   Patient reports she had 1 episode of diarrhea that started in the a.m. on July 19.  She reports it was nonbloody.  She reports she also had a high fever.  Her family reported to her that the decubitus ulcers that she has were malodorous.  Patient reports that she is not able to assess those herself.  She reports that she has no feeling below her waist, so they have not been any more painful.  Patient reports that the wounds have been there for a long time either months or years she is not sure.  She reports the first time she felt feverish was during the night between the 18th and 19th.  She reports she has had a normal appetite.  She has not had any new weakness, nausea, dizziness.  She has not noticed any change in her urine.  Patient has no other complaints at this time.  Patient does live with her daughter at home.   Patient does not smoke.  She drinks occasionally.  She is vaccinated for COVID and flu.  Patient is full code.   ED Temp 102.9, heart rate 106, respiratory rate 24, blood pressure 185/88, satting 93% Leukocytosis 11.3, hemoglobin 10.2 Chemistry reveals a BUN of 44 and a creatinine of 5.6 Albumin 2.8 UA is indicative of UTI, urine culture pending Blood cultures pending CT abdomen pelvis shows multiple findings but most  notably for bladder wall thickening, and possible osteomyelitis EKG shows a heart rate of 102, sinus tach, QTc 447 Admission requested for sepsis thought to be due to infected decubitus ulce    Assessment & Plan:   Principal Problem:   Sepsis (HCC) Active Problems:   Decubitus ulcers   Seizure disorder (HCC)   Essential hypertension   GERD (gastroesophageal reflux disease)   ESRD (end stage renal disease) (HCC)   Chronic pain     Assessment and Plan: * Sepsis (HCC) -Met sepsis criteria on admission with Temp 102.9, heart rate 106, respiratory rate 24  - Secondary to infected sacral decubitus ulcer versus UTI - POA: Buttocks decubitus-malodorous drainage - this morning the wound was cleaned, not draining dressing was in place - UA suspicious for UTI but patient does have suprapubic catheter  - Blood cultures/urine cultures - Previous blood cultures grew Enterococcus faecalis that was sensitive to vancomycin - Patient was started on vancomycin and cefepime in the ED, continue vancomycin  - CT abdomen pelvis with multiple findings including bladder wall thickening, and decubitus ulcers over left hip and ischial tuberosities with  possible osteomyelitis -Lactic acid 1.3, 1.0, -Procalcitonin 2.21, - ESR is elevated at 55, CRP pending - Obtaining MRI of the pelvis to rule out myelitis (MRI not available at [facility] this weekend)  - Continue to monitor  Decubitus ulcers - Unstageable - Possible source of infection - Continue vancomycin - Wound consult - Continue to monitor  Chronic pain - Continue buprenorphine  ESRD (end stage renal disease) (HCC) - Monday/Wednesday/Friday dialysis schedule - Did not get dialyzed on 7/19 due to diarrhea - Consult nephro for HD - Consulted Dr. Ronalee Belts - Continue to monitor  GERD (gastroesophageal reflux disease) - Continue Protonix  Essential hypertension - Continue Norvasc, Coreg, clonidine, ARB  Seizure disorder (HCC) - Continue  Keppra  ------------------------------------------------------------------------------------------------------------------------------- Nutritional status:  The patient's BMI is: Body mass index is 17.57 kg/m. I agree with the assessment and plan as outlined   Skin Assessment: I have examined the patient's skin and I agree with the wound assessment as performed by wound care team As outlined belowe: Pressure Injury 02/26/22 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 (Active)  02/26/22 0120  Location: Ischial tuberosity  Location Orientation: Left  Staging: Stage 4 - Full thickness tissue loss with exposed bone, tendon or muscle.  Wound Description (Comments):   Present on Admission: Yes  Dressing Type Foam - Lift dressing to assess site every shift 12/14/22 0100     Pressure Injury 02/26/22 Ischial tuberosity Right Stage 4 - Full thickness tissue loss with exposed bone, tendon or muscle. (Active)  02/26/22 0120  Location: Ischial tuberosity  Location Orientation: Right  Staging: Stage 4 - Full thickness tissue loss with exposed bone, tendon or muscle.  Wound Description (Comments):   Present on Admission: Yes  Dressing Type Foam - Lift dressing to assess site every shift 12/14/22 0100     ---------------------------------------------------------------------------------------------------------------------------------------------------- Cultures; Blood Cultures x 2 >> NGT Urine Culture  >>> NGT     ------------------------------------------------------------------------------------------------------------------------------------------------  DVT prophylaxis:  heparin injection 5,000 Units Start: 12/14/22 0600 SCDs Start: 12/14/22 0106   Code Status:   Code Status: Full Code  Family Communication: Discussed with daughter on the phone and the patient's cell phone The above findings and plan of care has been discussed  with patient (and family)  in detail,  they expressed understanding and agreement of above. -Advance care planning has been discussed.   Admission status:   Status is: Inpatient Remains inpatient appropriate because: Continue treatment per sepsis protocol, IV fluids, IV antibiotics,   Disposition: From  - home             Planning for discharge in 1-2 days: to   Procedures:   No admission procedures for hospital encounter.   Antimicrobials:  Anti-infectives (From admission, onward)    Start     Dose/Rate Route Frequency Ordered Stop   12/16/22 1600  vancomycin (VANCOREADY) IVPB 500 mg/100 mL        500 mg 100 mL/hr over 60 Minutes Intravenous Every M-W-F (Hemodialysis) 12/14/22 0859     12/13/22 2145  vancomycin (VANCOREADY) IVPB 750 mg/150 mL        750 mg 150 mL/hr over 60 Minutes Intravenous  Once 12/13/22 2142 12/13/22 2335   12/13/22 2130  ceFEPIme (MAXIPIME) 2 g in sodium chloride 0.9 % 100 mL IVPB        2 g 200 mL/hr over 30 Minutes Intravenous  Once 12/13/22 2129 12/13/22 2315  Medication:   amLODipine  10 mg Oral Once per day on Sunday Tuesday Thursday Saturday   [START ON 12/16/2022] amLODipine  5 mg Oral Once per day on Monday Wednesday Friday   [START ON 12/16/2022] buprenorphine  1 patch Transdermal Weekly   carvedilol  6.25 mg Oral BID WC   Chlorhexidine Gluconate Cloth  6 each Topical Daily   cloNIDine  0.1 mg Oral BID   heparin  5,000 Units Subcutaneous Q8H   irbesartan  150 mg Oral q1800   levETIRAcetam  1,000 mg Oral QPM   pantoprazole  40 mg Oral Daily    acetaminophen **OR** acetaminophen, albuterol, methocarbamol, ondansetron **OR** ondansetron (ZOFRAN) IV, oxyCODONE   Objective:   Vitals:   12/14/22 0600 12/14/22 0700 12/14/22 0738 12/14/22 1137  BP: (!) 149/66 (!) 156/71    Pulse: 70 69    Resp: 18 19    Temp:   97.7 F (36.5 C) 98.2 F (36.8 C)  TempSrc:   Oral Oral  SpO2: 95% 95%    Weight:      Height:         Intake/Output Summary (Last 24 hours) at 12/14/2022 1138 Last data filed at 12/14/2022 0348 Gross per 24 hour  Intake 100.81 ml  Output --  Net 100.81 ml   Filed Weights   12/13/22 2103 12/14/22 0103 12/14/22 0503  Weight: 48.1 kg 44.7 kg 45 kg     Physical examination:   Constitution:  Alert, cooperative, no distress,  Appears calm and comfortable  Psychiatric:   Normal and stable mood and affect, cognition intact,   HEENT:        Normocephalic, PERRL, otherwise with in Normal limits  Chest:         Chest symmetric Cardio vascular:  S1/S2, RRR, No murmure, No Rubs or Gallops  pulmonary: Clear to auscultation bilaterally, respirations unlabored, negative wheezes / crackles Abdomen: Soft, non-tender, non-distended, bowel sounds,no masses, no organomegaly Muscular skeletal: Limited exam - in bed, able upper extremities, paraplegic, contracted lower extremities Neuro: CNII-XII intact. , normal motor and sensation, reflexes intact  Extremities: No pitting edema lower extremities, +2 pulses  Skin: Dry, warm to touch, negative for any Rashes, No open wounds Wounds: Open stage IV decubitus wound,-both wounds or clean, not draining   ------------------------------------------------------------------------------------------------------------------------------------------    LABs:     Latest Ref Rng & Units 12/14/2022    4:45 AM 12/13/2022    9:00 PM 12/03/2022   10:39 AM  CBC  WBC 4.0 - 10.5 K/uL 9.6  11.3    Hemoglobin 12.0 - 15.0 g/dL 9.0  16.1  09.6   Hematocrit 36.0 - 46.0 % 29.9  34.5  34.0   Platelets 150 - 400 K/uL 147  169        Latest Ref Rng & Units 12/14/2022    4:45 AM 12/13/2022    9:00 PM 12/03/2022   10:39 AM  CMP  Glucose 70 - 99 mg/dL 045  409  811   BUN 6 - 20 mg/dL 52  44  49   Creatinine 0.44 - 1.00 mg/dL 9.14  7.82  9.56   Sodium 135 - 145 mmol/L 135  134  138   Potassium 3.5 - 5.1 mmol/L 3.8  4.2  3.7   Chloride 98 - 111 mmol/L 104  100  107   CO2 22  - 32 mmol/L 20  22    Calcium 8.9 - 10.3 mg/dL 8.0  7.9    Total Protein 6.5 -  8.1 g/dL 6.3  7.2    Total Bilirubin 0.3 - 1.2 mg/dL 1.0  0.8    Alkaline Phos 38 - 126 U/L 65  80    AST 15 - 41 U/L 20  19    ALT 0 - 44 U/L 12  13         Micro Results Recent Results (from the past 240 hour(s))  Culture, blood (Routine x 2)     Status: None (Preliminary result)   Collection Time: 12/13/22  9:29 PM   Specimen: BLOOD RIGHT HAND  Result Value Ref Range Status   Specimen Description BLOOD RIGHT HAND  Final   Special Requests   Final    BOTTLES DRAWN AEROBIC AND ANAEROBIC Blood Culture adequate volume   Culture   Final    NO GROWTH < 12 HOURS Performed at [facility], [address], [place], [place] [address]    Report Status PENDING  Incomplete  Culture, blood (Routine x 2)     Status: None (Preliminary result)   Collection Time: 12/13/22  9:36 PM   Specimen: Right Antecubital; Blood  Result Value Ref Range Status   Specimen Description RIGHT ANTECUBITAL  Final   Special Requests   Final    BOTTLES DRAWN AEROBIC AND ANAEROBIC Blood Culture results may not be optimal due to an excessive volume of blood received in culture bottles   Culture   Final    NO GROWTH < 12 HOURS Performed at [facility], [address] [place], [place] [address]    Report Status PENDING  Incomplete    Radiology Reports CT CHEST ABDOMEN PELVIS WO CONTRAST  Result Date: 12/13/2022 CLINICAL DATA:  Sepsis.  Fever and diarrhea. EXAM: CT CHEST, ABDOMEN AND PELVIS WITHOUT CONTRAST TECHNIQUE: Multidetector CT imaging of the chest, abdomen and pelvis was performed following the standard protocol without IV contrast. RADIATION DOSE REDUCTION: This exam was performed according to the departmental dose-optimization program which includes automated exposure control, adjustment of the mA and/or kV according to patient size and/or use of iterative reconstruction technique. COMPARISON:  11/10/2022. FINDINGS: CT CHEST  FINDINGS Cardiovascular: The heart is enlarged and there is a small pericardial effusion. A few coronary artery calcifications are noted. There is atherosclerotic calcification of the aorta without evidence of aneurysm. The pulmonary trunk is distended suggesting underlying pulmonary artery hypertension. Mediastinum/Nodes: No mediastinal or axillary lymphadenopathy. Evaluation of the hila is limited due to lack of IV contrast. Thyroid gland, trachea, and esophagus are within normal limits. Lungs/Pleura: Small pleural effusion is present on the right. Patchy atelectasis or infiltrate is noted in the in the lungs bilaterally. No pneumothorax. Musculoskeletal: Degenerative changes and fusion hardware are noted in the thoracic spine. There is bony deformity of the proximal sternum likely related to old trauma. There stable anterolisthesis at T7-T8. No acute osseous abnormality. CT ABDOMEN PELVIS FINDINGS Hepatobiliary: No focal liver abnormality is [name]. No gallstones, gallbladder wall thickening, or biliary dilatation. Pancreas: Unremarkable. No pancreatic ductal dilatation or surrounding inflammatory changes. Spleen: Normal in size without focal abnormality. Adrenals/Urinary Tract: The adrenal glands are within normal limits. Renal atrophy is noted bilaterally. No renal calculus or hydronephrosis. A suprapubic catheter is noted in the urinary bladder. There is diffuse bladder wall thickening. Stomach/Bowel: Stomach is within normal limits. Appendix is not [name]. No evidence of bowel wall thickening, distention, or inflammatory changes. No free air or pneumatosis. Vascular/Lymphatic: Aortic atherosclerosis. No enlarged abdominal or pelvic lymph nodes. Reproductive: Uterus and bilateral adnexa are unremarkable. Other:  No abdominopelvic ascites. Skin defects and soft tissue thickening are noted in the ischial regions bilaterally and over the left hip. No obvious abscess is [name]. A 5.7 x 2.4 cm hypodense collection with  calcifications is noted posterior to the sacrum on the right, increased in size from the prior exam. Musculoskeletal: Degenerative changes are present in the lumbar spine. No acute osseous abnormality is [name]. A stable joint effusion is noted at the right hip. IMPRESSION: 1. Small right pleural effusion with patchy atelectasis or infiltrates in the lungs bilaterally. 2. Cardiomegaly with small pericardial effusion. 3. Diffuse bladder wall thickening with suprapubic catheter in place, possible infectious or inflammatory cystitis. 4. Bilateral renal atrophy. 5. Decubitus ulcers over the left hip, and ischial tuberosities. Collection containing calcifications posterior to the sacrum and coccyx, increased in size from the prior exam. Consider MRI for further evaluation if there is clinical concern for osteomyelitis. Electronically Signed   By: Thornell Sartorius M.D.   On: 12/13/2022 22:27    SIGNED: Kendell Bane, MD, FHM. FAAFP. [facility] - [facility] Time spent - 55 min.  Critical care time was spent in seeing, evaluating and examining the patient. Reviewing medical records, labs, drawn plan of care. [facility],  Pager (please use [email or web address] to page/ text) Please use Epic Secure Chat for non-urgent communication (7AM-7PM)  If 7PM-7AM, please contact night-coverage [email or web address], 12/14/2022, 11:38 AM

## 2022-12-14 NOTE — Hospital Course (Signed)
[name] is a 57 y.o. female with medical history significant of hypertension, paraplegia, seizure disorder, suprapubic catheter, GERD, chronic pain, and more presents the ED with a chief complaint of fever and diarrhea.   Patient reports she had 1 episode of diarrhea that started in the a.m. on July 19.  She reports it was nonbloody.  She reports she also had a high fever.  Her family reported to her that the decubitus ulcers that she has were malodorous.  Patient reports that she is not able to assess those herself.  She reports that she has no feeling below her waist, so they have not been any more painful.  Patient reports that the wounds have been there for a long time either months or years she is not sure.  She reports the first time she felt feverish was during the night between the 18th and 19th.  She reports she has had a normal appetite.  She has not had any new weakness, nausea, dizziness.  She has not noticed any change in her urine.  Patient has no other complaints at this time.  Patient does live with her daughter at home.   Patient does not smoke.  She drinks occasionally.  She is vaccinated for COVID and flu.  Patient is full code.   ED Temp 102.9, heart rate 106, respiratory rate 24, blood pressure 185/88, satting 93% Leukocytosis 11.3, hemoglobin 10.2 Chemistry reveals a BUN of 44 and a creatinine of 5.6 Albumin 2.8 UA is indicative of UTI, urine culture pending Blood cultures pending CT abdomen pelvis shows multiple findings but most notably for bladder wall thickening, and possible osteomyelitis EKG shows a heart rate of 102, sinus tach, QTc 447 Admission requested for sepsis thought to be due to infected decubitus ulcer  CT [facility] showed a fluid collection posterior to sacrum and coccyx with calcification.  There was no obvious abscess  MR of pelvis showed a simple fluid collection adj to distal sacrum on the right 3.7 cm x 1.9 cm.  There was right hip effusion could not  rule out septic arthritis.   ID was consulted and continued to follow the patient.  Ortho was consulted and recommended transfer to tertiary care center.  Plastics not available.  After numerous calls to multiple centers, the patient was wait listed at [facility]

## 2022-12-14 NOTE — H&P (Signed)
History and Physical    Patient: [name] UVO:[id number] DOB: Nov 05, 1965 DOA: 12/13/2022 DOS: the patient was seen and examined on 12/14/2022 PCP: Richarda Osmond, FNP  Patient coming from: Home  Chief Complaint:  Chief Complaint  Patient presents with   Fever    diarrhea   HPI: [name] is a 57 y.o. female with medical history significant of hypertension, paraplegia, seizure disorder, suprapubic catheter, GERD, chronic pain, and more presents the ED with a chief complaint of fever and diarrhea.  Patient reports she had 1 episode of diarrhea that started in the a.m. on July 19.  She reports it was nonbloody.  She reports she also had a high fever.  Her family reported to her that the decubitus ulcers that she has were malodorous.  Patient reports that she is not able to assess those herself.  She reports that she has no feeling below her waist, so they have not been any more painful.  Patient reports that the wounds have been there for a long time either months or years she is not sure.  She reports the first time she felt feverish was during the night between the 18th and 19th.  She reports she has had a normal appetite.  She has not had any new weakness, nausea, dizziness.  She has not noticed any change in her urine.  Patient has no other complaints at this time.  Patient does live with her daughter at home.  Patient does not smoke.  She drinks occasionally.  She is vaccinated for COVID and flu.  Patient is full code. Review of Systems: As mentioned in the history of present illness. All other systems reviewed and are negative. Past Medical History:  Diagnosis Date   Abnormal uterine bleeding (AUB) 06/15/2014   Cancer (HCC)    uterine   High blood pressure    Paraplegia (lower)    Seizure disorder (HCC)    Seizures (HCC)    Suprapubic catheter (HCC)    Urinary tract infection    Past Surgical History:  Procedure Laterality Date   APPLICATION OF WOUND VAC  Right 09/13/2021   (approximately 1-29mos ago) pressure sore on right hip   BACK SURGERY     Pt stated "before 2000"   BIOPSY  12/03/2022   Procedure: BIOPSY;  Surgeon: Dolores Frame, MD;  Location: [facility];  Service: Gastroenterology;;   ESOPHAGOGASTRODUODENOSCOPY N/A 09/20/2015   Procedure: ESOPHAGOGASTRODUODENOSCOPY (EGD);  Surgeon: Malissa Hippo, MD;  Location: [facility];  Service: Endoscopy;  Laterality: N/A;  730   ESOPHAGOGASTRODUODENOSCOPY (EGD) WITH PROPOFOL N/A 12/03/2022   Procedure: ESOPHAGOGASTRODUODENOSCOPY (EGD) WITH PROPOFOL;  Surgeon: Dolores Frame, MD;  Location: [facility];  Service: Gastroenterology;  Laterality: N/A;  1:15 pm, asa 3, pt knows to arrive at 10:30  dialysis pt, M,W & F   IR CATHETER TUBE CHANGE  04/02/2018   PERCUTANEOUS ENDOSCOPIC GASTROSTOMY (PEG) REMOVAL N/A 09/20/2015   Procedure: PERCUTANEOUS ENDOSCOPIC GASTROSTOMY (PEG) REMOVAL;  Surgeon: Malissa Hippo, MD;  Location: [facility];  Service: Endoscopy;  Laterality: N/A;   TEE WITHOUT CARDIOVERSION N/A 11/11/2022   Procedure: TRANSESOPHAGEAL ECHOCARDIOGRAM (TEE);  Surgeon: Pricilla Riffle, MD;  Location: [facility];  Service: Cardiovascular;  Laterality: N/A;   Social History:  reports that she has never smoked. She has never used smokeless tobacco. She reports current alcohol use. She reports that she does not use drugs.  Allergies  Allergen Reactions   Benadryl [Diphenhydramine Hcl (Sleep)] Hives  Daptomycin Hives   Linezolid     Other Reaction(s): GI Intolerance  Patient self-discontinued treatment due to GI intolerance. Taking it along with moxifloxacin   Moxifloxacin     Other Reaction(s): GI Intolerance  Patient self-discontinued treatment due to GI intolerance. Taking it along with linezolid   Quinine Derivatives Other (See Comments)    Alters mental status   Vancomycin     Pt is tolerating this medication at HD   Azithromycin Itching and Rash    Tetracycline Itching    Able to tolerate Doxycycline.    Zosyn [Piperacillin Sod-Tazobactam So] Rash    Family History  Problem Relation Age of Onset   Cancer Mother    Hypertension Mother    Cancer Sister        breast and then spread everywhere.   Diabetes Paternal Grandmother    Hypertension Paternal Grandmother     Prior to Admission medications   Medication Sig Start Date End Date Taking? Authorizing Provider  acetaminophen (TYLENOL) 500 MG tablet Take 1,000 mg by mouth every 6 (six) hours as needed for moderate pain.    [provider]  albuterol (VENTOLIN HFA) 108 (90 Base) MCG/ACT inhaler Inhale 2 puffs into the lungs every 4 (four) hours as needed for wheezing or shortness of breath. 11/12/22   Shon Hale, MD  amLODipine (NORVASC) 5 MG tablet Take 1 tablet (5 mg total) by mouth daily. Take 1 tablet on Monday,Wednesday and Friday then take 2 tablets on Tuesday,Thursday,Saturday and Sunday. Patient taking differently: Take 5 mg by mouth See admin instructions. Take 1 tablet on Monday,Wednesday and Friday then take 2 tablets on Tuesday,Thursday,Saturday and Sunday. 11/12/22   Shon Hale, MD  B Complex-C-Folic Acid (RENA-VITE RX PO) Take 1 tablet by mouth every morning.    [provider]  buprenorphine (BUTRANS) 7.5 MCG/HR Place 1 patch onto the skin once a week. 10/03/22   [provider]  carvedilol (COREG) 6.25 MG tablet Take 6.25 mg by mouth 2 (two) times daily with a meal.    [provider]  cloNIDine (CATAPRES) 0.1 MG tablet Take 1 tablet (0.1 mg total) by mouth 2 (two) times daily. 11/12/22   Shon Hale, MD  furosemide (LASIX) 20 MG tablet Take 20 mg by mouth daily. 08/14/21   [provider]  irbesartan (AVAPRO) 150 MG tablet Take 150 mg by mouth daily at 6 PM.    [provider]  levETIRAcetam (KEPPRA) 500 MG tablet Take 1,000 mg by mouth every evening. Take an additional tablet on Monday,Wednesday and Friday  evening after Dialysis 08/09/21   [provider]  lidocaine-prilocaine (EMLA) cream Apply 1 Application topically 3 (three) times a week. 06/04/22   [provider]  methocarbamol (ROBAXIN) 500 MG tablet Take 500 mg by mouth daily as needed (pain in ribs).    [provider]  naloxone [facility]) nasal spray 4 mg/0.1 mL Place 0.4 mg into the nose once. 10/03/22   [provider]  pantoprazole (PROTONIX) 40 MG tablet Take 40 mg by mouth daily. 08/03/22   [provider]  senna-docusate (SENOKOT-S) 8.6-50 MG tablet Take 2 tablets by mouth at bedtime. 11/12/22   Shon Hale, MD    Physical Exam: Vitals:   12/14/22 0103 12/14/22 0118 12/14/22 0200 12/14/22 0300  BP: (!) 182/75 (!) 182/75 (!) 157/58 (!) 133/49  Pulse: 97  89 81  Resp: (!) 29  19 20   Temp: 99.7 F (37.6 C)     TempSrc: Oral  SpO2: 94%  93% 95%  Weight: 44.7 kg     Height: 5\' 3"  (1.6 m)      1.  General: Patient lying supine in bed,  no acute distress   2. Psychiatric: Alert and oriented x 3, mood and behavior normal for situation, pleasant and cooperative with exam   3. Neurologic: Speech and language are normal, face is symmetric, moves upper extremities voluntarily, lower extremities have involuntary spasms, at baseline without acute deficits on limited exam   4. HEENMT:  Head is atraumatic, normocephalic, pupils reactive to light, neck is supple, trachea is midline, mucous membranes are moist   5. Respiratory : Lungs are clear to auscultation bilaterally without wheezing, rhonchi, rales, no cyanosis, no increase in work of breathing or accessory muscle use   6. Cardiovascular : Heart rate normal, rhythm is regular, no murmurs, rubs or gallops, no peripheral edema, peripheral pulses palpated   7. Gastrointestinal:  Abdomen is soft, nondistended, nontender to palpation bowel sounds active, no masses or organomegaly palpated   8. Skin:  Unstageable decubitus ulcers    9.Musculoskeletal:  No acute deformities or trauma, no asymmetry in tone, no peripheral edema, peripheral pulses palpated, no tenderness to palpation in the extremities  Data Reviewed: In the ED Temp 102.9, heart rate 106, respiratory rate 24, blood pressure 185/88, satting 93% Leukocytosis 11.3, hemoglobin 10.2 Chemistry reveals a BUN of 44 and a creatinine of 5.6 Albumin 2.8 UA is indicative of UTI, urine culture pending Blood cultures pending CT abdomen pelvis shows multiple findings but most notably for bladder wall thickening, and possible osteomyelitis EKG shows a heart rate of 102, sinus tach, QTc 447 Admission requested for sepsis thought to be due to infected decubitus ulcer Assessment and Plan: Decubitus ulcers - Unstageable - Possible source of infection - Continue vancomycin - Wound consult - Continue to monitor  Chronic pain - Continue buprenorphine  ESRD (end stage renal disease) (HCC) - Monday/Wednesday/Friday dialysis schedule - Did not get dialyzed on 7/19 due to diarrhea - Consult nephro for HD - Inbox sent to Dr. Ronalee Belts - Continue to monitor  GERD (gastroesophageal reflux disease) - Continue Protonix  Sepsis (HCC) - Secondary to infected sacral decubitus ulcer versus UTI - Reported malodorous drainage from decubitus ulcer - UA suspicious for UTI but patient does have suprapubic catheter - Temp 102.9, heart rate 106, respiratory rate 24 - Blood cultures pending - Previous blood cultures grew Enterococcus faecalis that was sensitive to vancomycin - Patient was started on vancomycin and cefepime in the ED, continue vancomycin - CT abdomen pelvis with multiple findings including bladder wall thickening, and decubitus ulcers over left hip and ischial tuberosities with possible osteomyelitis - ESR is elevated at 55, CRP pending - Consider MRI to evaluate for osteo - Continue to monitor  Essential hypertension - Continue Norvasc, Coreg, clonidine,  ARB  Seizure disorder (HCC) - Continue Keppra      Advance Care Planning:   Code Status: Full Code  Consults: Wound consult  Family Communication: No family at bedside  Severity of Illness: The appropriate patient status for this patient is INPATIENT. Inpatient status is judged to be reasonable and necessary in order to provide the required intensity of service to ensure the patient's safety. The patient's presenting symptoms, physical exam findings, and initial radiographic and laboratory data in the context of their chronic comorbidities is felt to place them at high risk for further clinical deterioration. Furthermore, it is not anticipated that the patient will be medically  stable for discharge from the hospital within 2 midnights of admission.   * I certify that at the point of admission it is my clinical judgment that the patient will require inpatient hospital care spanning beyond 2 midnights from the point of admission due to high intensity of service, high risk for further deterioration and high frequency of surveillance required.*  Author: Lilyan Gilford, DO 12/14/2022 4:41 AM  For on call review [email or web address].

## 2022-12-14 NOTE — Assessment & Plan Note (Signed)
-   Continue Norvasc, Coreg, clonidine, ARB -Blood pressure elevated, Norvasc increased from 5 to 10 mg resuming the rest

## 2022-12-14 NOTE — Progress Notes (Signed)
Pharmacy Antibiotic Note  [name] is a 57 y.o. female admitted on 12/13/2022 with sepsis.  Pharmacy has been consulted for Vancomycin dosing. ESRD, recent abx with vancomycin completed 11/18/22 per notes. Also, with decubitus ulcers, malodorous. Possible UTI. Previous cxs + E. Faecalis Tmax 102.9, PCT 2.21  Plan: Continue Vancomycin 500mg  after HD on MWF F/U cxs and clinical progress Monitor V/s, labs and levels as indicated  Height: 5\' 3"  (160 cm) Weight: 45 kg (99 lb 3.3 oz) IBW/kg (Calculated) : 52.4  Temp (24hrs), Avg:100 F (37.8 C), Min:97.7 F (36.5 C), Max:102.9 F (39.4 C)  Recent Labs  Lab 12/13/22 2100 12/13/22 2240 12/14/22 0445  WBC 11.3*  --  9.6  CREATININE 5.86*  --  6.12*  LATICACIDVEN 1.3 1.0  --     Estimated Creatinine Clearance: 7.5 mL/min (A) (by C-G formula based on SCr of 6.12 mg/dL (H)).    Allergies  Allergen Reactions   Benadryl [Diphenhydramine Hcl (Sleep)] Hives   Daptomycin Hives   Linezolid     Other Reaction(s): GI Intolerance  Patient self-discontinued treatment due to GI intolerance. Taking it along with moxifloxacin   Moxifloxacin     Other Reaction(s): GI Intolerance  Patient self-discontinued treatment due to GI intolerance. Taking it along with linezolid   Quinine Derivatives Other (See Comments)    Alters mental status   Vancomycin     Pt is tolerating this medication at HD   Azithromycin Itching and Rash   Tetracycline Itching    Able to tolerate Doxycycline.    Zosyn [Piperacillin Sod-Tazobactam So] Rash    Antimicrobials this admission: Vancomycin 7/19 >>  Cefepime 7/19 x 1 dose  Microbiology results: 7/19 BCx: pending 7/19 UCx: pending   MRSA PCR:   Thank you for allowing pharmacy to be a part of this patient's care.  Elder Cyphers, BS Pharm D, BCPS Clinical Pharmacist 12/14/2022 9:01 AM

## 2022-12-14 NOTE — Assessment & Plan Note (Signed)
-   Continue buprenorphine

## 2022-12-15 DIAGNOSIS — A419 Sepsis, unspecified organism: Secondary | ICD-10-CM | POA: Diagnosis not present

## 2022-12-15 LAB — COMPREHENSIVE METABOLIC PANEL
ALT: 15 U/L (ref 0–44)
AST: 16 U/L (ref 15–41)
Albumin: 2.4 g/dL — ABNORMAL LOW (ref 3.5–5.0)
Alkaline Phosphatase: 68 U/L (ref 38–126)
Anion gap: 13 (ref 5–15)
BUN: 61 mg/dL — ABNORMAL HIGH (ref 6–20)
CO2: 16 mmol/L — ABNORMAL LOW (ref 22–32)
Calcium: 7.9 mg/dL — ABNORMAL LOW (ref 8.9–10.3)
Chloride: 100 mmol/L (ref 98–111)
Creatinine, Ser: 6.58 mg/dL — ABNORMAL HIGH (ref 0.44–1.00)
GFR, Estimated: 7 mL/min — ABNORMAL LOW (ref >60)
Glucose, Bld: 92 mg/dL (ref 70–99)
Potassium: 4.9 mmol/L (ref 3.5–5.1)
Sodium: 129 mmol/L — ABNORMAL LOW (ref 135–145)
Total Bilirubin: 0.9 mg/dL (ref 0.3–1.2)
Total Protein: 6.3 g/dL — ABNORMAL LOW (ref 6.5–8.1)

## 2022-12-15 LAB — CBC
HCT: 30.2 % — ABNORMAL LOW (ref 36.0–46.0)
Hemoglobin: 9.1 g/dL — ABNORMAL LOW (ref 12.0–15.0)
MCH: 29 pg (ref 26.0–34.0)
MCHC: 30.1 g/dL (ref 30.0–36.0)
MCV: 96.2 fL (ref 80.0–100.0)
Platelets: 145 10*3/uL — ABNORMAL LOW (ref 150–400)
RBC: 3.14 MIL/uL — ABNORMAL LOW (ref 3.87–5.11)
RDW: 16.9 % — ABNORMAL HIGH (ref 11.5–15.5)
WBC: 7.1 10*3/uL (ref 4.0–10.5)
nRBC: 0 % (ref 0.0–0.2)

## 2022-12-15 LAB — URINE CULTURE

## 2022-12-15 MED ORDER — VANCOMYCIN HCL 500 MG/100ML IV SOLN
500.0000 mg | Freq: Once | INTRAVENOUS | Status: AC
  Administered 2022-12-15: 500 mg via INTRAVENOUS
  Filled 2022-12-15: qty 100

## 2022-12-15 MED ORDER — AMLODIPINE BESYLATE 5 MG PO TABS
10.0000 mg | ORAL_TABLET | Freq: Every day | ORAL | Status: DC
  Administered 2022-12-15 – 2022-12-19 (×4): 10 mg via ORAL
  Filled 2022-12-15 (×5): qty 2

## 2022-12-15 MED ORDER — AMLODIPINE BESYLATE 5 MG PO TABS: 10.0000 mg | ORAL_TABLET | ORAL | Status: DC

## 2022-12-15 MED ORDER — LABETALOL HCL 5 MG/ML IV SOLN: 10.0000 mg | INTRAVENOUS | Status: DC | PRN

## 2022-12-15 MED ORDER — ALUM & MAG HYDROXIDE-SIMETH 200-200-20 MG/5ML PO SUSP: 30.0000 mL | Freq: Four times a day (QID) | ORAL | Status: DC | PRN

## 2022-12-15 MED ORDER — SODIUM CHLORIDE 0.9 % IV SOLN
1.0000 g | INTRAVENOUS | Status: DC
  Administered 2022-12-15 – 2022-12-16 (×2): 1 g via INTRAVENOUS
  Filled 2022-12-15 (×3): qty 10

## 2022-12-15 NOTE — Plan of Care (Signed)

## 2022-12-15 NOTE — Progress Notes (Signed)
Pharmacy Antibiotic Note  [name] is a 57 y.o. female admitted on 12/13/2022 with sepsis.  Pharmacy has been consulted for Vancomycin and Cefepime dosing. ESRD, recent abx with vancomycin completed 11/18/22 per notes. Also, with decubitus ulcers, malodorous. Possible UTI. Previous cxs + E. Faecalis Tmax 99.5, PCT 2.21. Concerns for infected decubitus ulcer. Adding cefepime For HD today, will enter 1 x time post HD today  Plan: Vancomycin 500mg  post HD today Cefepime 1gm IV q24h Continue Vancomycin 500mg  after HD on MWF F/U cxs and clinical progress Monitor V/s, labs and levels as indicated  Height: 5\' 3"  (160 cm) Weight: 46.7 kg (102 lb 15.3 oz) IBW/kg (Calculated) : 52.4  Temp (24hrs), Avg:98.5 F (36.9 C), Min:97.7 F (36.5 C), Max:99.5 F (37.5 C)  Recent Labs  Lab 12/13/22 2100 12/13/22 2240 12/14/22 0445 12/15/22 0435  WBC 11.3*  --  9.6 7.1  CREATININE 5.86*  --  6.12* 6.58*  LATICACIDVEN 1.3 1.0  --   --     Estimated Creatinine Clearance: 7.2 mL/min (A) (by C-G formula based on SCr of 6.58 mg/dL (H)).    Allergies  Allergen Reactions   Benadryl [Diphenhydramine Hcl (Sleep)] Hives   Daptomycin Hives   Linezolid     Other Reaction(s): GI Intolerance  Patient self-discontinued treatment due to GI intolerance. Taking it along with moxifloxacin   Moxifloxacin     Other Reaction(s): GI Intolerance  Patient self-discontinued treatment due to GI intolerance. Taking it along with linezolid   Quinine Derivatives Other (See Comments)    Alters mental status   Vancomycin     Pt is tolerating this medication at HD   Azithromycin Itching and Rash   Tetracycline Itching    Able to tolerate Doxycycline.    Zosyn [Piperacillin Sod-Tazobactam So] Rash    Antimicrobials this admission: Vancomycin 7/19 >>  Cefepime 7/19 >>  Microbiology results: 7/20 MRSA PCR neg 7/19 BCX ngtd 7/19 UCX: pending  Thank you for allowing pharmacy to be a part of this  patient's care.  Elder Cyphers, BS Pharm D, BCPS Clinical Pharmacist 12/15/2022 8:51 AM

## 2022-12-15 NOTE — Progress Notes (Signed)
PROGRESS NOTE    Patient: [name]                            PCP: [facility] [place], [place]                    DOB: 04-Sep-1965            DOA: 12/13/2022 RSW:[id number]             DOS: 12/15/2022, 10:23 AM   LOS: 2 days   Date of Service: The patient was [name] and examined on 12/15/2022  Subjective:   The patient was [name] and examined this morning, stable no acute distress, hemodynamically stable [place] and alert. Getting hemodialysis earlier this morning  Brief Narrative:   [name] is a 57 y.o. female with medical history significant of hypertension, paraplegia, seizure disorder, suprapubic catheter, GERD, chronic pain, and more presents the ED with a chief complaint of fever and diarrhea.   Patient reports she had 1 episode of diarrhea that started in the a.m. on July 19.  She reports it was nonbloody.  She reports she also had a high fever.  Her family reported to her that the decubitus ulcers that she has were malodorous.  Patient reports that she is not able to assess those herself.  She reports that she has no feeling below her waist, so they have not been any more painful.  Patient reports that the wounds have been there for a long time either months or years she is not sure.  She reports the first time she felt feverish was during the night between the 18th and 19th.  She reports she has had a normal appetite.  She has not had any new weakness, nausea, dizziness.  She has not noticed any change in her urine.  Patient has no other complaints at this time.  Patient does live with her daughter at home.   Patient does not smoke.  She drinks occasionally.  She is vaccinated for COVID and flu.  Patient is full code.   ED Temp 102.9, heart rate 106, respiratory rate 24, blood pressure 185/88, satting 93% Leukocytosis 11.3, hemoglobin 10.2 Chemistry reveals a BUN of 44 and a creatinine of 5.6 Albumin 2.8 UA is indicative of UTI, urine culture pending Blood cultures  pending CT abdomen pelvis shows multiple findings but most notably for bladder wall thickening, and possible osteomyelitis EKG shows a heart rate of 102, sinus tach, QTc 447 Admission requested for sepsis thought to be due to infected decubitus ulce    Assessment & Plan:   Principal Problem:   Sepsis (HCC) Active Problems:   Decubitus ulcers   Seizure disorder (HCC)   Essential hypertension   GERD (gastroesophageal reflux disease)   ESRD (end stage renal disease) (HCC)   Chronic pain     Assessment and Plan: * Sepsis (HCC) - Sepsis physiology resolved -Hemodynamically stable now Current vitals Blood pressure (!) 205/139, pulse 92, temperature 98.4 F (36.9 C), RR (!) 24, SpO2 97%.   -Met sepsis criteria on admission with Temp 102.9, heart rate 106, respiratory rate 24  - Secondary to ? infected sacral decubitus ulcer vs UTI - POA: Buttocks decubitus-malodorous drainage - this morning the wound was cleaned, not draining dressing was in place - UA suspicious for UTI but patient does have chronic suprapubic catheter  - Blood cultures/urine cultures >>  NGTD  - Previous blood cultures grew Enterococcus  faecalis that was sensitive to vancomycin - Patient was started on vancomycin and cefepime in the ED, continue vancomycin  - CT abdomen pelvis with multiple findings including bladder wall thickening, and decubitus ulcers over left hip and ischial tuberosities with possible osteomyelitis -Lactic acid 1.3, 1.0, -Procalcitonin 2.21, - ESR is elevated at 55, CRP   - Obtaining MRI of the pelvis to rule out myelitis (MRI not available at [facility] this weekend)   -Continue current antibiotics: Vancomycin 500mg  post HD today Cefepime 1gm IV q24h Continue Vancomycin 500mg  after HD on MWF - Continue to monitor  Decubitus ulcers - X 2 sacral 1 unstageable second 1 stage IV tunneling with drainage - Possible source of infection - Continue vancomycin-added cefepime - Wound  consult -Obtaining MRI of pelvic to rule out osteomyelitis - Continue to monitor  Chronic pain - Continue buprenorphine  ESRD (end stage renal disease) (HCC) - Monday/Wednesday/Friday dialysis schedule - Did not get dialyzed on 7/19 due to diarrhea - Consult nephro for HD - Consulted Dr. Ronalee Belts -Dialysis today 12/15/2022 - Continue to monitor  GERD (gastroesophageal reflux disease) - Continue Protonix  Essential hypertension - Continue Norvasc, Coreg, clonidine, ARB -Blood pressure elevated, Norvasc increased from 5 to 10 mg resuming the rest   Seizure disorder (HCC) - Continue Keppra  ------------------------------------------------------------------------------------------------------------------------------- Nutritional status:  The patient's BMI is: Body mass index is 18.24 kg/m. I agree with the assessment and plan as outlined   Skin Assessment: I have examined the patient's skin and I agree with the wound assessment as performed by wound care team As outlined belowe: Pressure Injury 02/26/22 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 (Active)  02/26/22 0120  Location: Ischial tuberosity  Location Orientation: Left  Staging: Stage 4 - Full thickness tissue loss with exposed bone, tendon or muscle.  Wound Description (Comments):   Present on Admission: Yes  Dressing Type Foam - Lift dressing to assess site every shift 12/15/22 0625     Pressure Injury 02/26/22 Ischial tuberosity Right Stage 4 - Full thickness tissue loss with exposed bone, tendon or muscle. (Active)  02/26/22 0120  Location: Ischial tuberosity  Location Orientation: Right  Staging: Stage 4 - Full thickness tissue loss with exposed bone, tendon or muscle.  Wound Description (Comments):   Present on Admission: Yes  Dressing Type Foam - Lift dressing to assess site every shift 12/15/22 0625    ------------------------------------------------------------------------------------------------------------------------------- Cultures; Blood Cultures x 2 >> NGT Urine Culture  >>> NGT   ---------------------------------------------------------------------------------------------------------------------------------  DVT prophylaxis:  heparin injection 5,000 Units Start: 12/14/22 0600 SCDs Start: 12/14/22 0106   Code Status:   Code Status: Full Code  Family Communication: Discussed with daughter on the phone and the patient's cell phone The above findings and plan of care has been discussed with patient (and family)  in detail,  they expressed understanding and agreement of above. -Advance care planning has been discussed.   Admission status:   Status is: Inpatient Remains inpatient appropriate because: Continue treatment per sepsis protocol, IV fluids, IV antibiotics,   Disposition: From  - home             Planning for discharge in 2 days to HOME w [facility]   Procedures:   No admission procedures for hospital encounter.   Antimicrobials:  Anti-infectives (From admission, onward)    Start     Dose/Rate Route Frequency Ordered Stop   12/16/22 1600  vancomycin (VANCOREADY) IVPB 500 mg/100 mL  500 mg 100 mL/hr over 60 Minutes Intravenous Every M-W-F (Hemodialysis) 12/14/22 0859     12/15/22 1600  ceFEPIme (MAXIPIME) 1 g in sodium chloride 0.9 % 100 mL IVPB        1 g 200 mL/hr over 30 Minutes Intravenous Every 24 hours 12/15/22 0857     12/15/22 1200  vancomycin (VANCOREADY) IVPB 500 mg/100 mL        500 mg 100 mL/hr over 60 Minutes Intravenous  Once 12/15/22 0825     12/13/22 2145  vancomycin (VANCOREADY) IVPB 750 mg/150 mL        750 mg 150 mL/hr over 60 Minutes Intravenous  Once 12/13/22 2142 12/13/22 2335   12/13/22 2130  ceFEPIme (MAXIPIME) 2 g in sodium chloride 0.9 % 100 mL IVPB        2 g 200 mL/hr over 30 Minutes Intravenous  Once 12/13/22 2129 12/13/22  2315        Medication:   amLODipine  10 mg Oral Daily   [START ON 12/16/2022] buprenorphine  1 patch Transdermal Weekly   carvedilol  6.25 mg Oral BID WC   Chlorhexidine Gluconate Cloth  6 each Topical Daily   Chlorhexidine Gluconate Cloth  6 each Topical Q0600   cloNIDine  0.1 mg Oral BID   heparin  5,000 Units Subcutaneous Q8H   irbesartan  150 mg Oral q1800   levETIRAcetam  1,000 mg Oral QPM    acetaminophen **OR** acetaminophen, albuterol, alum & mag hydroxide-simeth, methocarbamol, ondansetron **OR** ondansetron (ZOFRAN) IV, oxyCODONE   Objective:   Vitals:   12/15/22 0915 12/15/22 0930 12/15/22 0945 12/15/22 1000  BP: (!) 186/74 (!) 188/71 (!) 182/67 (!) 205/139  Pulse: 91 84 81 92  Resp: (!) 22 20 17  (!) 24  Temp:      TempSrc:      SpO2: 95% 97% 97% 97%  Weight:      Height:        Intake/Output Summary (Last 24 hours) at 12/15/2022 1023 Last data filed at 12/15/2022 0551 Gross per 24 hour  Intake 240 ml  Output 1000 ml  Net -760 ml   Filed Weights   12/14/22 0103 12/14/22 0503 12/15/22 0442  Weight: 44.7 kg 45 kg 46.7 kg     Physical examination:    General:  AAO x 3,  cooperative, no distress;   HEENT:  Normocephalic, PERRL, otherwise with in Normal limits   Neuro:  CNII-XII intact. , normal motor and sensation, reflexes intact   Lungs:   Clear to auscultation BL, Respirations unlabored,  No wheezes / crackles  Cardio:    S1/S2, RRR, No murmure, No Rubs or Gallops   Abdomen:  Soft, non-tender, bowel sounds active all four quadrants, no guarding or peritoneal signs.  Muscular  skeletal:  Limited exam -global generalized weaknesses -In bed, paraplegic, contracted lower extremities, moving upper extremities 2+ pulses,  symmetric, No pitting edema  Skin:  Dry, warm to touch, 2 stage IV decubitus sacral area Description as below  Wounds: Pressure ulcers  Pressure Injury 02/26/22 Ischial tuberosity Left Stage 4 - Full thickness tissue loss with  exposed bone, tendon or muscle. (Active)  02/26/22 0120  Location: Ischial tuberosity  Location Orientation: Left  Staging: Stage 4 - Full thickness tissue loss with exposed bone, tendon or muscle.  Wound Description (Comments):   Present on Admission: Yes  Dressing Type Foam - Lift dressing to assess site every shift 12/15/22 0625     Pressure Injury 02/26/22 Ischial  tuberosity Right Stage 4 - Full thickness tissue loss with exposed bone, tendon or muscle. (Active)  02/26/22 0120  Location: Ischial tuberosity  Location Orientation: Right  Staging: Stage 4 - Full thickness tissue loss with exposed bone, tendon or muscle.  Wound Description (Comments):   Present on Admission: Yes  Dressing Type Foam - Lift dressing to assess site every shift 12/15/22 0625    -------------------------------------------------------------------------------------------------------------------------------    LABs:     Latest Ref Rng & Units 12/15/2022    4:35 AM 12/14/2022    4:45 AM 12/13/2022    9:00 PM  CBC  WBC 4.0 - 10.5 K/uL 7.1  9.6  11.3   Hemoglobin 12.0 - 15.0 g/dL 9.1  9.0  34.7   Hematocrit 36.0 - 46.0 % 30.2  29.9  34.5   Platelets 150 - 400 K/uL 145  147  169       Latest Ref Rng & Units 12/15/2022    4:35 AM 12/14/2022    4:45 AM 12/13/2022    9:00 PM  CMP  Glucose 70 - 99 mg/dL 92  425  956   BUN 6 - 20 mg/dL 61  52  44   Creatinine 0.44 - 1.00 mg/dL 3.87  5.64  3.32   Sodium 135 - 145 mmol/L 129  135  134   Potassium 3.5 - 5.1 mmol/L 4.9  3.8  4.2   Chloride 98 - 111 mmol/L 100  104  100   CO2 22 - 32 mmol/L 16  20  22    Calcium 8.9 - 10.3 mg/dL 7.9  8.0  7.9   Total Protein 6.5 - 8.1 g/dL 6.3  6.3  7.2   Total Bilirubin 0.3 - 1.2 mg/dL 0.9  1.0  0.8   Alkaline Phos 38 - 126 U/L 68  65  80   AST 15 - 41 U/L 16  20  19    ALT 0 - 44 U/L 15  12  13         Micro Results Recent Results (from the past 240 hour(s))  Culture, blood (Routine x 2)     Status: None (Preliminary  result)   Collection Time: 12/13/22  9:29 PM   Specimen: BLOOD RIGHT HAND  Result Value Ref Range Status   Specimen Description BLOOD RIGHT HAND  Final   Special Requests   Final    BOTTLES DRAWN AEROBIC AND ANAEROBIC Blood Culture adequate volume   Culture   Final    NO GROWTH 2 DAYS Performed at [facility], [address] [place], [place] [address]    Report Status PENDING  Incomplete  Culture, blood (Routine x 2)     Status: None (Preliminary result)   Collection Time: 12/13/22  9:36 PM   Specimen: Right Antecubital; Blood  Result Value Ref Range Status   Specimen Description RIGHT ANTECUBITAL  Final   Special Requests   Final    BOTTLES DRAWN AEROBIC AND ANAEROBIC Blood Culture results may not be optimal due to an excessive volume of blood received in culture bottles   Culture   Final    NO GROWTH 2 DAYS Performed at [facility], [address] [place], [place] [address]    Report Status PENDING  Incomplete  MRSA Next Gen by PCR, Nasal     Status: None   Collection Time: 12/14/22  1:05 AM   Specimen: Nasal Mucosa; Nasal Swab  Result Value Ref Range Status   MRSA by PCR Next Gen NOT DETECTED  NOT DETECTED Final    Comment: (NOTE) The GeneXpert MRSA Assay (FDA approved for NASAL specimens only), is one component of a comprehensive MRSA colonization surveillance program. It is not intended to diagnose MRSA infection nor to guide or monitor treatment for MRSA infections. Test performance is not FDA approved in patients less than 6 years old. Performed at [facility], [address], [place], [place] [address]     Radiology Reports No results found.  SIGNED: Kendell Bane, MD, FHM. FAAFP. [facility] - [facility] Time spent - 55 min.  Critical care time was spent in seeing, evaluating and examining the patient. Reviewing medical records, labs, drawn plan of care. [facility],  Pager (please use [email or web address] to page/ text) Please use Epic Secure Chat for  non-urgent communication (7AM-7PM)  If 7PM-7AM, please contact night-coverage [email or web address], 12/15/2022, 10:23 AM

## 2022-12-15 NOTE — Consult Note (Signed)
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  R ischial Stage 4 Pressure Injury 2 cm x 2 cm x 1 cm with 0.8 cm undermining circumferentially  2. L ischium Stage 4 Pressure Injury 4 cm x 2.5 cm x 1 cm with 2 cm undermining from 12 to 6 o'clock  Wound bed: 100% pink and moist  Drainage (amount, consistency, odor) minimal serosanguinous  Periwound: intact, rolled wound edges  Dressing procedure/placement/frequency: Clean bilateral ischial wounds with NS, apply silver hydrofiber [place] [phone]) cut to fit wound bed daily making sure to tuck into undermining.  Cover with silicone foam.  May lift foam dressing daily to replace silver. Change foam dressing q3 days and prn soiling.   Patient should remain on low air loss mattress throughout hospitalization.    POC discussed with patient and bedside nurse.  WOC team will not follow.  Re-consult if further needs arise.   Thank you,    Priscella Mann MSN, RN-BC, [employer] [phone]

## 2022-12-15 NOTE — Progress Notes (Signed)
patient in bed  Alert and oriented.  Informed consent signed and in chart.   TX duration: 3 hours  Patient tolerated well.  Alert, without acute distress.  Hand-off given to patient's nurse.   Access used: fistula Access issues: none  Total UF removed: 2100 mls Medication(s) given: none Post HD VS: 233/82 Post HD weight: unable to obtain.     12/15/22 1145  Vitals  Temp 97.6 F (36.4 C)  Temp Source Oral  BP (!) 173/150  MAP (mmHg) 155  BP Location Right Arm  BP Method Automatic  Patient Position (if appropriate) Lying  Pulse Rate 89  ECG Heart Rate 93  Resp (!) 25  Oxygen Therapy  SpO2 95 %  O2 Device Room Air  Patient Activity (if Appropriate) In bed  Pulse Oximetry Type Continuous  Post Treatment  Dialyzer Clearance Lightly streaked  Duration of HD Treatment -hour(s) 3 hour(s)  Hemodialysis Intake (mL) 100 mL  Liters Processed 72  Fluid Removed (mL) 2100 mL  Tolerated HD Treatment Yes  Post-Hemodialysis Comments HD tx completed as expected, tolerated well, pt is stable.  AVG/AVF Arterial Site Held (minutes) 10 minutes  AVG/AVF Venous Site Held (minutes) 10 minutes  Note  Patient Observations pt is in bed resting  Fistula / Graft Left Upper arm Arteriovenous fistula  No placement date or time found.   Placed prior to admission: Yes  Orientation: Left  Access Location: Upper arm  Access Type: Arteriovenous fistula  Site Condition No complications  Fistula / Graft Assessment Present;Thrill;Bruit  Status Deaccessed  Drainage Description None

## 2022-12-15 NOTE — Progress Notes (Signed)
   12/15/22 1313  TOC Brief Assessment  Insurance and Status Reviewed  Patient has primary care physician Yes  Home environment has been reviewed Home with family  Prior level of function: need assistance  Prior/Current Home Services Current home services (Home health RN)  Social Determinants of Health Reivew SDOH reviewed no interventions necessary  Readmission risk has been reviewed Yes  Transition of care needs no transition of care needs at this time    Recent admission, HD patient at [facility], discharges with IV ABX, Used [employer] for home health.  Patient needs MR to evaluate possible osteo. TOC following for discharge needs.

## 2022-12-16 ENCOUNTER — Inpatient Hospital Stay (HOSPITAL_COMMUNITY): Payer: 59

## 2022-12-16 DIAGNOSIS — A419 Sepsis, unspecified organism: Secondary | ICD-10-CM | POA: Diagnosis not present

## 2022-12-16 LAB — CBC
HCT: 31.9 % — ABNORMAL LOW (ref 36.0–46.0)
Hemoglobin: 9.5 g/dL — ABNORMAL LOW (ref 12.0–15.0)
MCH: 28.4 pg (ref 26.0–34.0)
MCHC: 29.8 g/dL — ABNORMAL LOW (ref 30.0–36.0)
MCV: 95.5 fL (ref 80.0–100.0)
Platelets: 170 10*3/uL (ref 150–400)
RBC: 3.34 MIL/uL — ABNORMAL LOW (ref 3.87–5.11)
RDW: 16.8 % — ABNORMAL HIGH (ref 11.5–15.5)
WBC: 5.7 10*3/uL (ref 4.0–10.5)
nRBC: 0 % (ref 0.0–0.2)

## 2022-12-16 LAB — COMPREHENSIVE METABOLIC PANEL
ALT: 13 U/L (ref 0–44)
AST: 16 U/L (ref 15–41)
Albumin: 2.6 g/dL — ABNORMAL LOW (ref 3.5–5.0)
Alkaline Phosphatase: 70 U/L (ref 38–126)
Anion gap: 12 (ref 5–15)
BUN: 37 mg/dL — ABNORMAL HIGH (ref 6–20)
CO2: 25 mmol/L (ref 22–32)
Calcium: 8.2 mg/dL — ABNORMAL LOW (ref 8.9–10.3)
Chloride: 97 mmol/L — ABNORMAL LOW (ref 98–111)
Creatinine, Ser: 4.04 mg/dL — ABNORMAL HIGH (ref 0.44–1.00)
GFR, Estimated: 13 mL/min — ABNORMAL LOW (ref >60)
Glucose, Bld: 93 mg/dL (ref 70–99)
Potassium: 4.7 mmol/L (ref 3.5–5.1)
Sodium: 134 mmol/L — ABNORMAL LOW (ref 135–145)
Total Bilirubin: 0.8 mg/dL (ref 0.3–1.2)
Total Protein: 7 g/dL (ref 6.5–8.1)

## 2022-12-16 LAB — CULTURE, BLOOD (ROUTINE X 2)

## 2022-12-16 NOTE — Progress Notes (Signed)
Admit: 12/13/2022 LOS: 3  36F ESRD MWF [facility] with sepsis and concern for infected decubitus ulcer  Subjective:  HD yesterday 2.1L UF  No c/o this AM BPs stable On Vanc/Cefepime No fevers Labs stable this AM, K 4.7  07/21 0701 - 07/22 0700 In: 100 [IV Piggyback:100] Out: 2700 [Urine:600]  Filed Weights   12/14/22 0103 12/14/22 0503 12/15/22 0442  Weight: 44.7 kg 45 kg 46.7 kg    Scheduled Meds:  amLODipine  10 mg Oral Daily   buprenorphine  1 patch Transdermal Weekly   carvedilol  6.25 mg Oral BID WC   Chlorhexidine Gluconate Cloth  6 each Topical Daily   cloNIDine  0.1 mg Oral BID   heparin  5,000 Units Subcutaneous Q8H   irbesartan  150 mg Oral q1800   levETIRAcetam  1,000 mg Oral QPM   Continuous Infusions:  ceFEPime (MAXIPIME) IV 1 g (12/15/22 1625)   vancomycin     PRN Meds:.acetaminophen **OR** acetaminophen, albuterol, alum & mag hydroxide-simeth, labetalol, methocarbamol, ondansetron **OR** ondansetron (ZOFRAN) IV, oxyCODONE  Current Labs: reviewed   Physical Exam:  Blood pressure (!) 178/88, pulse 86, temperature 98.4 F (36.9 C), resp. rate 18, height 5\' 3"  (1.6 m), weight 46.7 kg, SpO2 94%. NAD lying in bed LUE AVF +B/T CTAB NO rub, regular  A ESRD MWF LUE AVF [facility] Sepsis  Chronic decubitus ulcer concern for infection for MRI Anemia, Hb 9.5 stable CKDBMM: P and Ca stable HTN/Vol: BPs variable, trend.  On RA  P Offered HD again today to get back on schedule, she's not sure she desires even though missed Friday.  Will f/u with her MRI needed as primary goal today Will follow along Medication Issues; Preferred narcotic agents for pain control are hydromorphone, fentanyl, and methadone. Morphine should not be used.  Baclofen should be avoided Avoid oral sodium phosphate and magnesium citrate based laxatives / bowel preps    Sabra Heck MD 12/16/2022, 8:48 AM  Recent Labs  Lab 12/14/22 0445 12/15/22 0435 12/16/22 0459  NA  135 129* 134*  K 3.8 4.9 4.7  CL 104 100 97*  CO2 20* 16* 25  GLUCOSE 118* 92 93  BUN 52* 61* 37*  CREATININE 6.12* 6.58* 4.04*  CALCIUM 8.0* 7.9* 8.2*  PHOS 5.2*  --   --    Recent Labs  Lab 12/13/22 2100 12/14/22 0445 12/15/22 0435 12/16/22 0459  WBC 11.3* 9.6 7.1 5.7  NEUTROABS 9.9* 8.2*  --   --   HGB 10.2* 9.0* 9.1* 9.5*  HCT 34.5* 29.9* 30.2* 31.9*  MCV 97.5 98.4 96.2 95.5  PLT 169 147* 145* 170

## 2022-12-16 NOTE — Progress Notes (Signed)
PROGRESS NOTE    Patient: [name] Seen                            PCP: [facility] [place], [place]                    DOB: May 13, 1966            DOA: 12/13/2022 NWG:[id number]             DOS: 12/16/2022, 11:20 AM   LOS: 3 days   Date of Service: The patient was seen and examined on 12/16/2022  Subjective:   The patient was seen and examined this morning, laying in bed comfortable no acute distress, blood pressure improving but still elevated Otherwise afebrile, improved leukocytosis Status post hemodialysis yesterday 12/15/2022  Brief Narrative:   Laekyn Rayos is a 57 y.o. female with medical history significant of hypertension, paraplegia, seizure disorder, suprapubic catheter, GERD, chronic pain, and more presents the ED with a chief complaint of fever and diarrhea.   Patient reports she had 1 episode of diarrhea that started in the a.m. on July 19.  She reports it was nonbloody.  She reports she also had a high fever.  Her family reported to her that the decubitus ulcers that she has were malodorous.  Patient reports that she is not able to assess those herself.  She reports that she has no feeling below her waist, so they have not been any more painful.  Patient reports that the wounds have been there for a long time either months or years she is not sure.  She reports the first time she felt feverish was during the night between the 18th and 19th.  She reports she has had a normal appetite.  She has not had any new weakness, nausea, dizziness.  She has not noticed any change in her urine.  Patient has no other complaints at this time.  Patient does live with her daughter at home.   Patient does not smoke.  She drinks occasionally.  She is vaccinated for COVID and flu.  Patient is full code.   ED Temp 102.9, heart rate 106, respiratory rate 24, blood pressure 185/88, satting 93% Leukocytosis 11.3, hemoglobin 10.2 Chemistry reveals a BUN of 44 and a creatinine of 5.6 Albumin  2.8 UA is indicative of UTI, urine culture pending Blood cultures pending CT abdomen pelvis shows multiple findings but most notably for bladder wall thickening, and possible osteomyelitis EKG shows a heart rate of 102, sinus tach, QTc 447 Admission requested for sepsis thought to be due to infected decubitus ulce    Assessment & Plan:   Principal Problem:   Sepsis (HCC) Active Problems:   Decubitus ulcers   Seizure disorder (HCC)   Essential hypertension   GERD (gastroesophageal reflux disease)   ESRD (end stage renal disease) (HCC)   Chronic pain     Assessment and Plan: * Sepsis (HCC) - Sepsis physiology resolved -Elevated blood pressure otherwise hemodynamically stable  -POA: Met sepsis criteria on admission with Temp 102.9, heart rate 106, respiratory rate 24  - Secondary to ? infected sacral decubitus ulcer vs UTI - POA: Buttocks decubitus-malodorous drainage - this morning the wound was cleaned, not draining dressing was in place - UA suspicious for UTI but patient does have chronic suprapubic catheter  - Blood cultures/urine cultures >>  NGTD   - Previous blood cultures grew Enterococcus faecalis that was sensitive to vancomycin -  Patient was started on vancomycin and cefepime in the ED, continue vancomycin  - CT abdomen pelvis with multiple findings including bladder wall thickening, and decubitus ulcers over left hip and ischial tuberosities with possible osteomyelitis -Lactic acid 1.3, 1.0, -Procalcitonin 2.21, - ESR is elevated at 55,   - MRI pelvic-pending, to rule out osteomyelitis  -Continue current antibiotics: Vanco plus cefepime  Continue Vancomycin 500mg  after HD on MWF - Continue to monitor  Decubitus ulcers - X 2 sacral decubitus stage IV left greater than right Left 1 tunneling - Possible source of infection - Continue vancomycin + cefepime - Wound consult -appreciate wound care  -Pending MRI of pelvic today 12/16/2022 to rule out  osteomyelitis  - Continue to monitor  Chronic pain - Continue buprenorphine  ESRD (end stage renal disease) (HCC) - Monday/Wednesday/Friday dialysis schedule - Did not get dialyzed on 7/19 due to diarrhea - Consult nephro for HD - Consulted Dr. Ronalee Belts -Dialysis  12/15/2022 - Continue to monitor  GERD (gastroesophageal reflux disease) - Continue Protonix  Essential hypertension - Continue Norvasc, Coreg, clonidine, ARB -Blood pressure elevated, Norvasc increased from 5 to 10 mg resuming the rest   Seizure disorder (HCC) - Continue Keppra  ------------------------------------------------------------------------------------------------------------------------------- Nutritional status:  The patient's BMI is: Body mass index is 18.24 kg/m. I agree with the assessment and plan as outlined   Skin Assessment: I have examined the patient's skin and I agree with the wound assessment as performed by wound care team As outlined belowe: Pressure Injury 02/26/22 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 (Active)  02/26/22 0120  Location: Ischial tuberosity  Location Orientation: Left  Staging: Stage 4 - Full thickness tissue loss with exposed bone, tendon or muscle.  Wound Description (Comments):   Present on Admission: Yes  Dressing Type Foam - Lift dressing to assess site every shift 12/15/22 2011     Pressure Injury 02/26/22 Ischial tuberosity Right Stage 4 - Full thickness tissue loss with exposed bone, tendon or muscle. (Active)  02/26/22 0120  Location: Ischial tuberosity  Location Orientation: Right  Staging: Stage 4 - Full thickness tissue loss with exposed bone, tendon or muscle.  Wound Description (Comments):   Present on Admission: Yes  Dressing Type Gauze (Comment) 12/15/22 2011    ------------------------------------------------------------------------------------------------------------------------------- Cultures; Blood Cultures x 2 >> NGT Urine Culture  >>> NGT   ---------------------------------------------------------------------------------------------------------------------------------  DVT prophylaxis:  heparin injection 5,000 Units Start: 12/14/22 0600 SCDs Start: 12/14/22 0106   Code Status:   Code Status: Full Code  Family Communication: Discussed with daughter on the phone and the patient's cell phone The above findings and plan of care has been discussed with patient (and family)  in detail,  they expressed understanding and agreement of above. -Advance care planning has been discussed.   Admission status:   Status is: Inpatient Remains inpatient appropriate because: Continue treatment per sepsis protocol, IV fluids, IV antibiotics,   Disposition: From  - home             Planning for discharge in 2 days to HOME w [facility]   Procedures:   No admission procedures for hospital encounter.   Antimicrobials:  Anti-infectives (From admission, onward)    Start     Dose/Rate Route Frequency Ordered Stop   12/16/22 1600  vancomycin (VANCOREADY) IVPB 500 mg/100 mL        500 mg 100 mL/hr over 60 Minutes Intravenous Every M-W-F (Hemodialysis) 12/14/22 5284  12/15/22 1600  ceFEPIme (MAXIPIME) 1 g in sodium chloride 0.9 % 100 mL IVPB        1 g 200 mL/hr over 30 Minutes Intravenous Every 24 hours 12/15/22 0857     12/15/22 1200  vancomycin (VANCOREADY) IVPB 500 mg/100 mL        500 mg 100 mL/hr over 60 Minutes Intravenous  Once 12/15/22 0825 12/15/22 1153   12/13/22 2145  vancomycin (VANCOREADY) IVPB 750 mg/150 mL        750 mg 150 mL/hr over 60 Minutes Intravenous  Once 12/13/22 2142 12/13/22 2335   12/13/22 2130  ceFEPIme (MAXIPIME) 2 g in sodium chloride 0.9 % 100 mL IVPB        2 g 200 mL/hr over 30 Minutes Intravenous  Once 12/13/22  2129 12/13/22 2315        Medication:   amLODipine  10 mg Oral Daily   buprenorphine  1 patch Transdermal Weekly   carvedilol  6.25 mg Oral BID WC   Chlorhexidine Gluconate Cloth  6 each Topical Daily   cloNIDine  0.1 mg Oral BID   heparin  5,000 Units Subcutaneous Q8H   irbesartan  150 mg Oral q1800   levETIRAcetam  1,000 mg Oral QPM    acetaminophen **OR** acetaminophen, albuterol, alum & mag hydroxide-simeth, labetalol, methocarbamol, ondansetron **OR** ondansetron (ZOFRAN) IV, oxyCODONE   Objective:   Vitals:   12/15/22 2048 12/16/22 0030 12/16/22 0438 12/16/22 0747  BP: (!) 145/61 (!) 145/63 (!) 164/73 (!) 178/88  Pulse: 83 83 76 86  Resp: 20 20 19 18   Temp: 99 F (37.2 C) 98.4 F (36.9 C) 98.3 F (36.8 C) 98.4 F (36.9 C)  TempSrc: Oral Oral Oral   SpO2: 95% 94% 95% 94%  Weight:      Height:        Intake/Output Summary (Last 24 hours) at 12/16/2022 1120 Last data filed at 12/16/2022 0900 Gross per 24 hour  Intake 340 ml  Output 2700 ml  Net -2360 ml   Filed Weights   12/14/22 0103 12/14/22 0503 12/15/22 0442  Weight: 44.7 kg 45 kg 46.7 kg     Physical examination:         General:  AAO x 3,  cooperative, no distress;   HEENT:  Normocephalic, PERRL, otherwise with in Normal limits   Neuro:  CNII-XII intact. , normal motor and sensation, reflexes intact   Lungs:   Clear to auscultation BL, Respirations unlabored,  No wheezes / crackles  Cardio:    S1/S2, RRR, No murmure, No Rubs or Gallops   Abdomen:  Soft, non-tender, bowel sounds active all four quadrants, no guarding or peritoneal signs.  Muscular  skeletal:  Limited exam -global generalized weaknesses -Paraplegic, contracted bilateral lower extremities, decubitus x 2 with dressing in place, Moving bilateral upper extremities,   2+ pulses,  symmetric, No pitting edema  Skin:  Dry, warm to touch, see below for description 2 stage IV pressure ulcers left 1 exposed to the bone tunneling   Wounds: Please see nursing documentation  Pressure Injury 02/26/22 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 (Active)  02/26/22 0120  Location: Ischial tuberosity  Location Orientation: Left  Staging: Stage 4 - Full thickness tissue loss with exposed bone, tendon or muscle.  Wound Description (Comments):   Present on Admission: Yes  Dressing Type Foam - Lift dressing to assess site every shift 12/15/22 2011  Pressure Injury 02/26/22 Ischial tuberosity Right Stage 4 - Full thickness tissue loss with exposed bone, tendon or muscle. (Active)  02/26/22 0120  Location: Ischial tuberosity  Location Orientation: Right  Staging: Stage 4 - Full thickness tissue loss with exposed bone, tendon or muscle.  Wound Description (Comments):   Present on Admission: Yes  Dressing Type Gauze (Comment) 12/15/22 2011        ---------------------------------------------------------------------------------------    LABs:     Latest Ref Rng & Units 12/16/2022    4:59 AM 12/15/2022    4:35 AM 12/14/2022    4:45 AM  CBC  WBC 4.0 - 10.5 K/uL 5.7  7.1  9.6   Hemoglobin 12.0 - 15.0 g/dL 9.5  9.1  9.0   Hematocrit 36.0 - 46.0 % 31.9  30.2  29.9   Platelets 150 - 400 K/uL 170  145  147       Latest Ref Rng & Units 12/16/2022    4:59 AM 12/15/2022    4:35 AM 12/14/2022    4:45 AM  CMP  Glucose 70 - 99 mg/dL 93  92  604   BUN 6 - 20 mg/dL 37  61  52   Creatinine 0.44 - 1.00 mg/dL 5.40  9.81  1.91   Sodium 135 - 145 mmol/L 134  129  135   Potassium 3.5 - 5.1 mmol/L 4.7  4.9  3.8   Chloride 98 - 111 mmol/L 97  100  104   CO2 22 - 32 mmol/L 25  16  20    Calcium 8.9 - 10.3 mg/dL 8.2  7.9  8.0   Total Protein 6.5 - 8.1 g/dL 7.0  6.3  6.3   Total Bilirubin 0.3 - 1.2 mg/dL 0.8  0.9  1.0   Alkaline Phos 38 - 126 U/L 70  68  65   AST 15 - 41 U/L 16  16  20    ALT 0 - 44 U/L 13  15  12         Micro Results Recent Results (from the past 240  hour(s))  Urine Culture (for pregnant, neutropenic or urologic patients or patients with an indwelling urinary catheter)     Status: Abnormal   Collection Time: 12/13/22  9:15 PM   Specimen: Urine, Clean Catch  Result Value Ref Range Status   Specimen Description   Final    URINE, CLEAN CATCH Performed at [facility], [address], [place], [place] [address]    Special Requests   Final    NONE Performed at [facility], [address] [place], [place] [address]    Culture MULTIPLE SPECIES PRESENT, SUGGEST RECOLLECTION (A)  Final   Report Status 12/15/2022 FINAL  Final  Culture, blood (Routine x 2)     Status: None (Preliminary result)   Collection Time: 12/13/22  9:29 PM   Specimen: BLOOD RIGHT HAND  Result Value Ref Range Status   Specimen Description BLOOD RIGHT HAND  Final   Special Requests   Final    BOTTLES DRAWN AEROBIC AND ANAEROBIC Blood Culture adequate volume   Culture   Final    NO GROWTH 3 DAYS Performed at [facility], [address] [place], [place] [address]    Report Status PENDING  Incomplete  Culture, blood (Routine x 2)     Status: None (Preliminary result)   Collection Time: 12/13/22  9:36 PM   Specimen: Right Antecubital; Blood  Result Value Ref Range Status   Specimen Description RIGHT ANTECUBITAL  Final   Special Requests   Final    BOTTLES DRAWN AEROBIC AND ANAEROBIC Blood Culture results may not be optimal due to an excessive volume of blood received in culture bottles   Culture   Final    NO GROWTH 3 DAYS Performed at [facility], [address], [place], [place] [address]    Report Status PENDING  Incomplete  MRSA Next Gen by PCR, Nasal     Status: None   Collection Time: 12/14/22  1:05 AM   Specimen: Nasal Mucosa; Nasal Swab  Result Value Ref Range Status   MRSA by PCR Next Gen NOT DETECTED NOT DETECTED Final    Comment: (NOTE) The GeneXpert MRSA Assay (FDA approved for NASAL specimens only), is one component of a comprehensive MRSA  colonization surveillance program. It is not intended to diagnose MRSA infection nor to guide or monitor treatment for MRSA infections. Test performance is not FDA approved in patients less than 49 years old. Performed at [facility], [address], [place], [place] [address]     Radiology Reports No results found.  SIGNED: Kendell Bane, MD, FHM. FAAFP. Redge Gainer - [facility] hospitalist Time spent - 55 min.  Critical care time was spent in seeing, evaluating and examining the patient. Reviewing medical records, labs, drawn plan of care. [facility],  Pager (please use [email or web address] to page/ text) Please use Epic Secure Chat for non-urgent communication (7AM-7PM)  If 7PM-7AM, please contact night-coverage [email or web address], 12/16/2022, 11:20 AM

## 2022-12-16 NOTE — Plan of Care (Signed)

## 2022-12-16 NOTE — Plan of Care (Signed)

## 2022-12-17 DIAGNOSIS — A419 Sepsis, unspecified organism: Secondary | ICD-10-CM | POA: Diagnosis not present

## 2022-12-17 DIAGNOSIS — M00851 Arthritis due to other bacteria, right hip: Secondary | ICD-10-CM

## 2022-12-17 DIAGNOSIS — M86152 Other acute osteomyelitis, left femur: Secondary | ICD-10-CM | POA: Diagnosis not present

## 2022-12-17 LAB — COMPREHENSIVE METABOLIC PANEL
ALT: 13 U/L (ref 0–44)
AST: 13 U/L — ABNORMAL LOW (ref 15–41)
Albumin: 2.4 g/dL — ABNORMAL LOW (ref 3.5–5.0)
Alkaline Phosphatase: 65 U/L (ref 38–126)
Anion gap: 14 (ref 5–15)
BUN: 43 mg/dL — ABNORMAL HIGH (ref 6–20)
CO2: 22 mmol/L (ref 22–32)
Calcium: 8.5 mg/dL — ABNORMAL LOW (ref 8.9–10.3)
Chloride: 97 mmol/L — ABNORMAL LOW (ref 98–111)
Creatinine, Ser: 4.97 mg/dL — ABNORMAL HIGH (ref 0.44–1.00)
GFR, Estimated: 10 mL/min — ABNORMAL LOW (ref >60)
Glucose, Bld: 90 mg/dL (ref 70–99)
Potassium: 4.3 mmol/L (ref 3.5–5.1)
Sodium: 133 mmol/L — ABNORMAL LOW (ref 135–145)
Total Bilirubin: 0.5 mg/dL (ref 0.3–1.2)
Total Protein: 6.5 g/dL (ref 6.5–8.1)

## 2022-12-17 LAB — CULTURE, BLOOD (ROUTINE X 2): Culture: NO GROWTH

## 2022-12-17 LAB — CBC
HCT: 30.8 % — ABNORMAL LOW (ref 36.0–46.0)
Hemoglobin: 9.3 g/dL — ABNORMAL LOW (ref 12.0–15.0)
MCH: 28.8 pg (ref 26.0–34.0)
MCHC: 30.2 g/dL (ref 30.0–36.0)
MCV: 95.4 fL (ref 80.0–100.0)
Platelets: 170 10*3/uL (ref 150–400)
RBC: 3.23 MIL/uL — ABNORMAL LOW (ref 3.87–5.11)
RDW: 16.5 % — ABNORMAL HIGH (ref 11.5–15.5)
WBC: 4.5 10*3/uL (ref 4.0–10.5)
nRBC: 0 % (ref 0.0–0.2)

## 2022-12-17 LAB — HEPATITIS B SURFACE ANTIBODY, QUANTITATIVE: Hep B S AB Quant (Post): 179 m[IU]/mL

## 2022-12-17 MED ORDER — SODIUM CHLORIDE 0.9 % IV SOLN
2.0000 g | INTRAVENOUS | Status: DC
  Administered 2022-12-17 – 2022-12-19 (×3): 2 g via INTRAVENOUS
  Filled 2022-12-17 (×3): qty 20

## 2022-12-17 NOTE — Progress Notes (Signed)
Patient slept through the night only waking when staff entered the room for vitals. Dressing changed this AM. No complaints of pain. Continued to monitor.

## 2022-12-17 NOTE — Progress Notes (Addendum)
Pharmacy Antibiotic Note  [name] is a 57 y.o. female admitted on 12/13/2022 with sepsis.  Pharmacy has been consulted for Vancomycin and Cefepime dosing. ESRD, recent abx with vancomycin completed 11/18/22 per notes. Also, with decubitus ulcers, malodorous. Possible UTI. Previous cxs + E. Faecalis  Plan: Per nephrology note today, next HD session planned for 7/24 (this will return her to regular schedule of MWF) Continue vancomycin 500 mg IV after each HD session Continue cefepime 1 g IV q24H F/U cxs and clinical progress Monitor V/s, labs and levels as indicated  Height: 5\' 3"  (160 cm) Weight: 46.7 kg (102 lb 15.3 oz) IBW/kg (Calculated) : 52.4  Temp (24hrs), Avg:98.3 F (36.8 C), Min:97.8 F (36.6 C), Max:98.9 F (37.2 C)  Recent Labs  Lab 12/13/22 2100 12/13/22 2240 12/14/22 0445 12/15/22 0435 12/16/22 0459 12/17/22 0317  WBC 11.3*  --  9.6 7.1 5.7 4.5  CREATININE 5.86*  --  6.12* 6.58* 4.04* 4.97*  LATICACIDVEN 1.3 1.0  --   --   --   --     Estimated Creatinine Clearance: 9.5 mL/min (A) (by C-G formula based on SCr of 4.97 mg/dL (H)).    Allergies  Allergen Reactions   Benadryl [Diphenhydramine Hcl (Sleep)] Hives   Daptomycin Hives   Linezolid     Other Reaction(s): GI Intolerance  Patient self-discontinued treatment due to GI intolerance. Taking it along with moxifloxacin   Moxifloxacin     Other Reaction(s): GI Intolerance  Patient self-discontinued treatment due to GI intolerance. Taking it along with linezolid   Quinine Derivatives Other (See Comments)    Alters mental status   Vancomycin     Pt is tolerating this medication at HD   Azithromycin Itching and Rash   Tetracycline Itching    Able to tolerate Doxycycline.    Zosyn [Piperacillin Sod-Tazobactam So] Rash    Antimicrobials this admission: Vancomycin 7/19 >>  Cefepime 7/19 >>  Microbiology results: 7/20 MRSA PCR neg 7/19 BCX: NG4D 7/19 UCX: Multiple spp  Thank you for allowing  pharmacy to be a part of this patient's care.  Will M. Dareen Piano, PharmD Clinical Pharmacist 12/17/2022 1:01 PM

## 2022-12-17 NOTE — Consult Note (Signed)
Virtual Visit via Telephone/Video Note   I connected with [name] Seen   On 12/17/2022 at 3:16 PM  by Video and verified that I am speaking with the correct person using two identifiers.   I discussed the limitations, risks, security and privacy concerns of performing an evaluation and management service by video.The patient expressed understanding and agreed to proceed.   Location:   Patient: [name] Provider: [facility] for Infectious Disease    Date of Admission:  12/13/2022   Total days of inpatient antibiotics 3        Reason for Consult: F    Principal Problem:   Sepsis (HCC) Active Problems:   Seizure disorder (HCC)   Essential hypertension   GERD (gastroesophageal reflux disease)   Decubitus ulcers   ESRD (end stage renal disease) (HCC)   Chronic pain   Assessment: 57 year old female admitted with: #Sepsis secondary to left femoral greater trochanter osteomyelitis, distal sacral abscess, right hip septic arthritis #Pneumonia -Patient had recent admission 6/14 - 6/18 for probably micro bacteremia(E faecalis, staph hemolyticus, group C strep) [place] unclear source GU versus decubitus ulcer translocation discharged on vancomycin from negative culture sent 10 days. - She reports that she woke up Friday with diarrhea and temp 104.  On arrival to the ED, she had temp 102.9, WBC 23 K.  She was started on cefepime and vancomycin.  Blood cultures no growth.  Diarrhea is now resolved.  She denies any discomfort fistula site.  Wound care was engaged on 7/21 noted minimal serosanguineous drainage. - CT head showed bilateral infiltrates/infection, bladder wall thickening.   - MRI was nondiagnostic intramurally due to patient discomfort although did note left greater throat trochanter concerning for osteo, partially imaged simple appearing fluid collection adjacent to distal sacrum measuring 3.7X 1.9 cm, right hip joint effusion septic  arthritis Recommendations:  -D/C cefepime - Start ceftriaxone - Continue vancomycin - Follow-up blood cultures - Per my communication with primary/epic chat, orthopedics  engaged.  Dr. Lajoyce Corners does not have availability this week, noted that patient is open debridement(do not think IR helpful in this case).  Recommended plastics, which is not available at this time.  Primary is working on tertiary care placement.   Microbiology:   Antibiotics: Cefepime 7/19, 7/21-7/22 Vancomycin 7/19, 7/21 Cultures: Blood 7/19 NG Urine 7/19 multiple sp    HPI: [name] is a 57 y.o. female ESRD on HD, seizure disorder, paraplegia, chronic suprapubic catheter, history of uterine cancer, chronic sacral decubitus ulcer, recent admission 6/14 - 6/18 polymicrobial bacteremia with E faecalis, staph hemolyticus, group C strep, can [place] to unclear source possible GU or conservative dialysis access translation from decubitus ulcers versus GU.  Throat cultures positive group A strep, repeat blood culture on 6/14 no growth.  Discharged on 10 days of antibiotics with vancomycin with HD TEE did not show vegetation. Patient reports starting Friday she woke with diarrhea and 10 104.  Her daughter came home and noticed that she had a high temperature. CT chest abdomen pelvis showed small right pleural effusion and patchy atelectasis/infiltrates bilaterally in the lungs.,  Diffuse bladder wall thickening with suprapubic catheter in place possibly infectious, decubitus ulcer over left hip and ischial tuberosity consistent containing calcifications.  MRI showed evidence of bone marrow edema in the left greater throat trochanter concerning for osteo, partially imaged simple appearing fluid collection adjacent to distal sacrum measuring 3.7X 1.9 cm, right hip joint effusion septic arthritis not excluded of note  MRI was incomplete essentially nondiagnostic exam as it was terminated due to patient discomfort. Review of  Systems: Review of Systems  All other systems reviewed and are negative.   Past Medical History:  Diagnosis Date   Abnormal uterine bleeding (AUB) 06/15/2014   Cancer (HCC)    uterine   High blood pressure    Paraplegia (lower)    Seizure disorder (HCC)    Seizures (HCC)    Suprapubic catheter (HCC)    Urinary tract infection     Social History   Tobacco Use   Smoking status: Never   Smokeless tobacco: Never  Vaping Use   Vaping status: Never Used  Substance Use Topics   Alcohol use: Yes    Comment: Occassionally   Drug use: No    Family History  Problem Relation Age of Onset   Cancer Mother    Hypertension Mother    Cancer Sister        breast and then spread everywhere.   Diabetes Paternal Grandmother    Hypertension Paternal Grandmother    Scheduled Meds:  amLODipine  10 mg Oral Daily   buprenorphine  1 patch Transdermal Weekly   carvedilol  6.25 mg Oral BID WC   Chlorhexidine Gluconate Cloth  6 each Topical Daily   cloNIDine  0.1 mg Oral BID   heparin  5,000 Units Subcutaneous Q8H   irbesartan  150 mg Oral q1800   levETIRAcetam  1,000 mg Oral QPM   Continuous Infusions:  ceFEPime (MAXIPIME) IV 1 g (12/16/22 1418)   vancomycin Stopped (12/16/22 1529)   PRN Meds:.acetaminophen **OR** acetaminophen, albuterol, alum & mag hydroxide-simeth, labetalol, methocarbamol, ondansetron **OR** ondansetron (ZOFRAN) IV, oxyCODONE Allergies  Allergen Reactions   Benadryl [Diphenhydramine Hcl (Sleep)] Hives   Daptomycin Hives   Linezolid     Other Reaction(s): GI Intolerance  Patient self-discontinued treatment due to GI intolerance. Taking it along with moxifloxacin   Moxifloxacin     Other Reaction(s): GI Intolerance  Patient self-discontinued treatment due to GI intolerance. Taking it along with linezolid   Quinine Derivatives Other (See Comments)    Alters mental status   Vancomycin     Pt is tolerating this medication at HD   Azithromycin Itching and Rash    Tetracycline Itching    Able to tolerate Doxycycline.    Zosyn [Piperacillin Sod-Tazobactam So] Rash    OBJECTIVE: Blood pressure (!) 170/86, pulse 74, temperature 97.9 F (36.6 C), temperature source Oral, resp. rate 20, height 5\' 3"  (1.6 m), weight 46.7 kg, SpO2 98%.    Lab Results Lab Results  Component Value Date   WBC 4.5 12/17/2022   HGB 9.3 (L) 12/17/2022   HCT 30.8 (L) 12/17/2022   MCV 95.4 12/17/2022   PLT 170 12/17/2022    Lab Results  Component Value Date   CREATININE 4.97 (H) 12/17/2022   BUN 43 (H) 12/17/2022   NA 133 (L) 12/17/2022   K 4.3 12/17/2022   CL 97 (L) 12/17/2022   CO2 22 12/17/2022    Lab Results  Component Value Date   ALT 13 12/17/2022   AST 13 (L) 12/17/2022   ALKPHOS 65 12/17/2022   BILITOT 0.5 12/17/2022       Danelle Earthly, MD [facility] 12/17/2022, 11:39 AM I have personally spent 82 minutes involved in face-to-face and non-face-to-face activities for this patient on the day of the visit. Professional time spent includes the following activities: Preparing to see the  patient (review of tests), Obtaining and/or reviewing separately obtained history (admission/discharge record), Performing a medically appropriate examination and/or evaluation , Ordering medications/tests/procedures, referring and communicating with other health care professionals, Documenting clinical information in the EMR, Independently interpreting results (not separately reported), Communicating results to the patient/family/caregiver, Counseling and educating the patient/family/caregiver and Care coordination (not separately reported).

## 2022-12-17 NOTE — Progress Notes (Signed)
PROGRESS NOTE    Patient: [name]                            PCP: [facility] [place], [place]                    DOB: 10-11-1965            DOA: 12/13/2022 ZOX:[id number]             DOS: 12/17/2022, 2:56 PM   LOS: 4 days   Date of Service: The patient was [name] and examined on 12/17/2022  Subjective:   Patient was [name] and examined this morning, stable no acute distress Hemodynamically stable  Brief Narrative:   [name] is a 57 y.o. female with medical history significant of hypertension, paraplegia, seizure disorder, suprapubic catheter, GERD, chronic pain, and more presents the ED with a chief complaint of fever and diarrhea.   Patient reports she had 1 episode of diarrhea that started in the a.m. on July 19.  She reports it was nonbloody.  She reports she also had a high fever.  Her family reported to her that the decubitus ulcers that she has were malodorous.  Patient reports that she is not able to assess those herself.  She reports that she has no feeling below her waist, so they have not been any more painful.  Patient reports that the wounds have been there for a long time either months or years she is not sure.  She reports the first time she felt feverish was during the night between the 18th and 19th.  She reports she has had a normal appetite.  She has not had any new weakness, nausea, dizziness.  She has not noticed any change in her urine.  Patient has no other complaints at this time.  Patient does live with her daughter at home.   Patient does not smoke.  She drinks occasionally.  She is vaccinated for COVID and flu.  Patient is full code.   ED Temp 102.9, heart rate 106, respiratory rate 24, blood pressure 185/88, satting 93% Leukocytosis 11.3, hemoglobin 10.2 Chemistry reveals a BUN of 44 and a creatinine of 5.6 Albumin 2.8 UA is indicative of UTI, urine culture pending Blood cultures pending CT abdomen pelvis shows multiple findings but most notably  for bladder wall thickening, and possible osteomyelitis EKG shows a heart rate of 102, sinus tach, QTc 447 Admission requested for sepsis thought to be due to infected decubitus ulce    Assessment & Plan:   Principal Problem:   Sepsis (HCC) Active Problems:   Decubitus ulcers   Seizure disorder (HCC)   Essential hypertension   GERD (gastroesophageal reflux disease)   ESRD (end stage renal disease) (HCC)   Chronic pain     Assessment and Plan: * Sepsis (HCC) - Sepsis physiology resolved -Elevated blood pressure otherwise hemodynamically stable  -POA: Met sepsis criteria on admission with Temp 102.9, heart rate 106, respiratory rate 24  - Secondary to ? infected sacral decubitus ulcer vs UTI - POA: Buttocks decubitus-malodorous drainage - this morning the wound was cleaned, not draining dressing was in place - UA suspicious for UTI but patient does have chronic suprapubic catheter  - Blood cultures/urine cultures >>  NGTD   - Previous blood cultures grew Enterococcus faecalis that was sensitive to vancomycin - Patient was started on vancomycin and cefepime in the ED, continue vancomycin  - CT abdomen  pelvis with multiple findings including bladder wall thickening, and decubitus ulcers over left hip and ischial tuberosities with possible osteomyelitis  - MRI was completed, nondiagnostic, motion degradation but could not rule out septic arthritis, fluid collection, and osteomyelitis -ID Dr. Thedore Mins consulted appreciated the Dr. Thedore Mins consulted appreciated for evaluation recommendation  -Lactic acid 1.3, 1.0, -Procalcitonin 2.21, - ESR is elevated at 55,    -Continue current antibiotics: Vanco plus cefepime  Continue Vancomycin 500mg  after HD on MWF  -Discussed with orthopedic team, ID, called tertiary care [facility], [facility] they do not any bed available at this time. Reached out and surgeon: There are no plastic surgery available Discussing with  interventional radiology for possible drainage hip   At this point Patient's option is only antibiotics per ID  - Continue to monitor  Decubitus ulcers - X 2 sacral decubitus stage IV left greater than right Left 1 tunneling - Possible source of infection - Continue vancomycin + cefepime - Wound consult -appreciate wound care  -MRI 12/16/2022 -nondiagnostic but revealing -sacral decubitus ulcersleft greater trochanter, concerning for osteomyelitis. Partially imaged simple-appearing fluid collection adjacent to the distal sacrum on the right measuring 3.7 x 1.9 cm. 4. Moderate-sized right hip joint effusion. Septic arthritis not excluded. 5. Generalized soft tissue edema.  Discussed the case in detail with Dr. Thedore Mins appreciate further input and antibiotics recommendation  Discussed the case with Dr. Romeo Apple who feels that the patient needs orthopedic/infectious disease/plastic surgery intervention  -Discussed with Dr. Lajoyce Corners he will not be able to see or help this patient  -Start uninvestigated plastic surgery is not available at [facility]  -I have reached out to [facility] multiple times for transfer unfortunately do not have a bed available for this patient to be transferred  -Patient's only option is IV antibiotics for now  -Reached out to interventional radiologist at [facility] for possible drainage per ID recommendation  - Continue to monitor  Chronic pain - Continue buprenorphine  ESRD (end stage renal disease) (HCC) - Monday/Wednesday/Friday dialysis schedule - Did not get dialyzed on 7/19 due to diarrhea - Consult nephro for HD - Nephrology following closely -Dialysis  12/15/2022>> hemodialysis in a.m. - Continue to monitor  GERD (gastroesophageal reflux disease) - Continue Protonix  Essential hypertension - Continue Norvasc, Coreg, clonidine, ARB -Blood pressure elevated, Norvasc increased from 5 to 10 mg resuming the rest   Seizure  disorder (HCC) - Continue Keppra  ------------------------------------------------------------------------------------------------------------------------------- Nutritional status:  The patient's BMI is: Body mass index is 18.24 kg/m. I agree with the assessment and plan as outlined   Skin Assessment: I have examined the patient's skin and I agree with the wound assessment as performed by wound care team As outlined belowe: Pressure Injury 02/26/22 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 (Active)  02/26/22 0120  Location: Ischial tuberosity  Location Orientation: Left  Staging: Stage 4 - Full thickness tissue loss with exposed bone, tendon or muscle.  Wound Description (Comments):   Present on Admission: Yes  Dressing Type Foam - Lift dressing to assess site every shift 12/17/22 0912     Pressure Injury 02/26/22 Ischial tuberosity Right Stage 4 - Full thickness tissue loss with exposed bone, tendon or muscle. (Active)  02/26/22 0120  Location: Ischial tuberosity  Location Orientation: Right  Staging: Stage 4 - Full thickness tissue loss with exposed bone, tendon or muscle.  Wound Description (Comments):   Present  on Admission: Yes  Dressing Type Gauze (Comment) 12/16/22 2214   ------------------------------------------------------------------------------------------------------------------------------- Cultures; Blood Cultures x 2 >> NGT Urine Culture  >>> NGT   ---------------------------------------------------------------------------------------------------------------------------------  DVT prophylaxis:  heparin injection 5,000 Units Start: 12/14/22 0600 SCDs Start: 12/14/22 0106   Code Status:   Code Status: Full Code  Family Communication: Discussed with the patient and her son at the bedside The above findings and plan of care has been discussed with patient (and family)  in detail,  they expressed  understanding and agreement of above. -Advance care planning has been discussed.   Admission status:   Status is: Inpatient Remains inpatient appropriate because: Continue treatment per sepsis protocol, IV fluids, IV antibiotics,   Disposition: From  - home             Planning for discharge in 2 days to HOME w [facility]   Procedures:   No admission procedures for hospital encounter.   Antimicrobials:  Anti-infectives (From admission, onward)    Start     Dose/Rate Route Frequency Ordered Stop   12/16/22 1600  vancomycin (VANCOREADY) IVPB 500 mg/100 mL        500 mg 100 mL/hr over 60 Minutes Intravenous Every M-W-F (Hemodialysis) 12/14/22 0859     12/15/22 1600  ceFEPIme (MAXIPIME) 1 g in sodium chloride 0.9 % 100 mL IVPB        1 g 200 mL/hr over 30 Minutes Intravenous Every 24 hours 12/15/22 0857     12/15/22 1200  vancomycin (VANCOREADY) IVPB 500 mg/100 mL        500 mg 100 mL/hr over 60 Minutes Intravenous  Once 12/15/22 0825 12/15/22 1153   12/13/22 2145  vancomycin (VANCOREADY) IVPB 750 mg/150 mL        750 mg 150 mL/hr over 60 Minutes Intravenous  Once 12/13/22 2142 12/13/22 2335   12/13/22 2130  ceFEPIme (MAXIPIME) 2 g in sodium chloride 0.9 % 100 mL IVPB        2 g 200 mL/hr over 30 Minutes Intravenous  Once 12/13/22 2129 12/13/22 2315        Medication:   amLODipine  10 mg Oral Daily   buprenorphine  1 patch Transdermal Weekly   carvedilol  6.25 mg Oral BID WC   Chlorhexidine Gluconate Cloth  6 each Topical Daily   cloNIDine  0.1 mg Oral BID   heparin  5,000 Units Subcutaneous Q8H   irbesartan  150 mg Oral q1800   levETIRAcetam  1,000 mg Oral QPM    acetaminophen **OR** acetaminophen, albuterol, alum & mag hydroxide-simeth, labetalol, methocarbamol, ondansetron **OR** ondansetron (ZOFRAN) IV, oxyCODONE   Objective:   Vitals:   12/16/22 1405 12/16/22 2214 12/17/22 0538 12/17/22 1257  BP: (!) 147/79 (!) 164/85 (!) 170/86 (!) 145/69  Pulse: 77 78 74 73   Resp: (!) 22 20  20   Temp: 98.6 F (37 C) 97.8 F (36.6 C) 97.9 F (36.6 C) 98.9 F (37.2 C)  TempSrc:   Oral Oral  SpO2: 94% 98% 98% 97%  Weight:      Height:        Intake/Output Summary (Last 24 hours) at 12/17/2022 1456 Last data filed at 12/17/2022 1256 Gross per 24 hour  Intake 2113.16 ml  Output 1750 ml  Net 363.16 ml   Filed Weights   12/14/22 0103 12/14/22 0503 12/15/22 0442  Weight: 44.7 kg 45 kg 46.7 kg     Physical examination:  General:  AAO x 3,  cooperative, no distress;   HEENT:  Normocephalic, PERRL, otherwise with in Normal limits   Neuro:  CNII-XII intact. , normal motor and sensation, reflexes intact   Lungs:   Clear to auscultation BL, Respirations unlabored,  No wheezes / crackles  Cardio:    S1/S2, RRR, No murmure, No Rubs or Gallops   Abdomen:  Soft, non-tender, bowel sounds active all four quadrants, no guarding or peritoneal signs.  Muscular  skeletal:  Paraplegic with lower extremity contraction, 2 pressure ulcers as described below  Limited exam -global generalized weaknesses - in bed, able to move all 4 extremities,   2+ pulses,  symmetric, No pitting edema  Skin:  Dry, warm to touch, open wound sacral decubitus  Wounds:   Pressure Injury 02/26/22 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 (Active)  02/26/22 0120  Location: Ischial tuberosity  Location Orientation: Left  Staging: Stage 4 - Full thickness tissue loss with exposed bone, tendon or muscle.  Wound Description (Comments):   Present on Admission: Yes  Dressing Type Foam - Lift dressing to assess site every shift 12/17/22 0912     Pressure Injury 02/26/22 Ischial tuberosity Right Stage 4 - Full thickness tissue loss with exposed bone, tendon or muscle. (Active)  02/26/22 0120  Location: Ischial tuberosity  Location Orientation: Right  Staging: Stage 4 - Full thickness tissue loss with exposed bone, tendon  or muscle.  Wound Description (Comments):   Present on Admission: Yes  Dressing Type Gauze (Comment) 12/16/22 2214           ---------------------------------------------------------------------------------------    LABs:     Latest Ref Rng & Units 12/17/2022    3:17 AM 12/16/2022    4:59 AM 12/15/2022    4:35 AM  CBC  WBC 4.0 - 10.5 K/uL 4.5  5.7  7.1   Hemoglobin 12.0 - 15.0 g/dL 9.3  9.5  9.1   Hematocrit 36.0 - 46.0 % 30.8  31.9  30.2   Platelets 150 - 400 K/uL 170  170  145       Latest Ref Rng & Units 12/17/2022    3:17 AM 12/16/2022    4:59 AM 12/15/2022    4:35 AM  CMP  Glucose 70 - 99 mg/dL 90  93  92   BUN 6 - 20 mg/dL 43  37  61   Creatinine 0.44 - 1.00 mg/dL 1.91  4.78  2.95   Sodium 135 - 145 mmol/L 133  134  129   Potassium 3.5 - 5.1 mmol/L 4.3  4.7  4.9   Chloride 98 - 111 mmol/L 97  97  100   CO2 22 - 32 mmol/L 22  25  16    Calcium 8.9 - 10.3 mg/dL 8.5  8.2  7.9   Total Protein 6.5 - 8.1 g/dL 6.5  7.0  6.3   Total Bilirubin 0.3 - 1.2 mg/dL 0.5  0.8  0.9   Alkaline Phos 38 - 126 U/L 65  70  68   AST 15 - 41 U/L 13  16  16    ALT 0 - 44 U/L 13  13  15         Micro Results Recent Results (from the past 240 hour(s))  Urine Culture (for pregnant, neutropenic or urologic patients or patients with an indwelling urinary catheter)     Status: Abnormal   Collection Time: 12/13/22  9:15 PM  Specimen: Urine, Clean Catch  Result Value Ref Range Status   Specimen Description   Final    URINE, CLEAN CATCH Performed at [facility], [address], [place], [place] [address]    Special Requests   Final    NONE Performed at [facility], [address] [place], [place] [address]    Culture MULTIPLE SPECIES PRESENT, SUGGEST RECOLLECTION (A)  Final   Report Status 12/15/2022 FINAL  Final  Culture, blood (Routine x 2)     Status: None (Preliminary result)   Collection Time: 12/13/22  9:29 PM   Specimen: BLOOD RIGHT HAND  Result Value Ref Range Status    Specimen Description BLOOD RIGHT HAND  Final   Special Requests   Final    BOTTLES DRAWN AEROBIC AND ANAEROBIC Blood Culture adequate volume   Culture   Final    NO GROWTH 4 DAYS Performed at [facility], [address], [place], [place] [address]    Report Status PENDING  Incomplete  Culture, blood (Routine x 2)     Status: None (Preliminary result)   Collection Time: 12/13/22  9:36 PM   Specimen: Right Antecubital; Blood  Result Value Ref Range Status   Specimen Description RIGHT ANTECUBITAL  Final   Special Requests   Final    BOTTLES DRAWN AEROBIC AND ANAEROBIC Blood Culture results may not be optimal due to an excessive volume of blood received in culture bottles   Culture   Final    NO GROWTH 4 DAYS Performed at [facility], [address] [place], [place] [address]    Report Status PENDING  Incomplete  MRSA Next Gen by PCR, Nasal     Status: None   Collection Time: 12/14/22  1:05 AM   Specimen: Nasal Mucosa; Nasal Swab  Result Value Ref Range Status   MRSA by PCR Next Gen NOT DETECTED NOT DETECTED Final    Comment: (NOTE) The GeneXpert MRSA Assay (FDA approved for NASAL specimens only), is one component of a comprehensive MRSA colonization surveillance program. It is not intended to diagnose MRSA infection nor to guide or monitor treatment for MRSA infections. Test performance is not FDA approved in patients less than 26 years old. Performed at [facility], [address] [place], [place] [address]     Radiology Reports No results found.  SIGNED: Kendell Bane, MD, FHM. FAAFP. Red[facility] Gainer - [facility] hospitalist Time spent - 55 min.  Critical care time was spent in seeing, evaluating and examining the patient. Reviewing medical records, labs, drawn plan of care. [facility],  Pager (please use [email or web address] to page/ text) Please use Epic Secure Chat for non-urgent communication (7AM-7PM)  If 7PM-7AM, please contact night-coverage [email or web address], 12/17/2022, 2:56  PM

## 2022-12-17 NOTE — TOC Initial Note (Signed)
Transition of Care [facility]) - Initial/Assessment Note    Patient Details  Name: [name] MRN: [id number] Date of Birth: [birth year]  Transition [facility]) CM/SW Contact:    Leitha Bleak, RN Phone Number: 12/17/2022, 11:41 AM  Clinical Narrative:    Patient admitted with sepsis. Patient has a high risk for readmission. CM spoke with patient she lives with her daughter. She uses [employer] or [facility] for transportation. She has hospital bed and all equipment needed in the home. She is requesting a new air mattress. CM spoke with Mitch( [employer]). She received at mattress in June 2023, insurance will not cover another one, CM explained that the patient is stating the one she has is not working properly. Mitch put in a service call for techs to go assess. Patient updated. Continuing medical work up.                 Barriers to Discharge: Continued Medical Work up  Patient Goals and CMS Choice Patient states their goals for this hospitalization and ongoing recovery are:: to go home. CMS Medicare.gov Compare Post Acute Care list provided to:: Patient   Expected Discharge Plan and Services      Living arrangements for the past 2 months: Single Family Home                   HH Agency: [facility]      Prior Living Arrangements/Services Living arrangements for the past 2 months: Single Family Home Lives with:: Adult Children Patient language and need for interpreter reviewed:: Yes        Need for Family Participation in Patient Care: Yes (Comment) Care giver support system in place?: Yes (comment) Current home services: DME Criminal Activity/Legal Involvement Pertinent to Current Situation/Hospitalization: No - Comment as needed  Activities of Daily Living Home Assistive Devices/Equipment: Wheelchair, Nurse, adult ADL Screening (condition at time of admission) Patient's cognitive ability adequate to safely complete daily activities?: Yes Is the patient deaf or have  difficulty hearing?: No Does the patient have difficulty seeing, even when wearing glasses/contacts?: No Does the patient have difficulty concentrating, remembering, or making decisions?: No Patient able to express need for assistance with ADLs?: Yes Does the patient have difficulty dressing or bathing?: Yes Independently performs ADLs?: No Communication: Independent Dressing (OT): Needs assistance Is this a change from baseline?: Pre-admission baseline Grooming: Needs assistance Is this a change from baseline?: Pre-admission baseline Feeding: Independent Bathing: Independent with device (comment) Toileting: Independent with device (comment) In/Out Bed: Independent with device (comment) Walks in Home: Independent with device (comment) Does the patient have difficulty walking or climbing stairs?: Yes Weakness of Legs: Both (Paraplegic) Weakness of Arms/Hands: None  Permission Sought/Granted       Emotional Assessment     Affect (typically observed): Accepting Orientation: : Oriented to Self, Oriented to Place, Oriented to  Time, Oriented to Situation Alcohol / Substance Use: Not Applicable Psych Involvement: No (comment)  Admission diagnosis:  Wound of sacral region, initial encounter [S31.000A] Sepsis (HCC) [A41.9] Fever, unspecified fever cause [R50.9] Patient Active Problem List   Diagnosis Date Noted   Chronic pain 12/14/2022   Bacteremia 11/08/2022   Loss of weight 10/26/2022   N&V (nausea and vomiting) 10/26/2022   Cellulitis of knee, left 07/24/2022   Infected decubitus ulcer 07/23/2022   Osteomyelitis of multiple sites-chronic     Chronic anemia 02/25/2022   ESRD (end stage renal disease) (HCC) 02/25/2022   Abscess of buttock, right 08/25/2021  GERD (gastroesophageal reflux disease) 08/25/2021   Anxiety 08/25/2021   Decubitus ulcers 08/25/2021   Anemia 08/24/2021   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 chronic kidney disease (HCC) 04/25/2017   AKI (acute kidney injury)  (HCC) 04/25/2017   Dehydration 04/25/2017   Metabolic acidosis 04/25/2017   Altered mental status 06/22/2016   Altered mental state 08/16/2015   Pressure ulcer 05/16/2015   Acute encephalopathy 05/15/2015   Malnutrition (HCC)    Encephalopathy acute 10/10/2014   Sepsis (HCC) 10/10/2014   Left lower lobe pneumonia 10/10/2014   Encephalomalacia 10/10/2014   TBI (traumatic brain injury) (HCC) 10/10/2014   Severe protein-calorie malnutrition (HCC) 10/10/2014   UTI (urinary tract infection) 08/26/2014   Hypokalemia 08/26/2014   Abnormal uterine bleeding (AUB) 06/15/2014   Essential hypertension    Seizure disorder (HCC)    Status epilepticus (HCC) 05/02/2014   Hypertensive emergency without congestive heart failure 05/02/2014   Acute renal failure (HCC) 05/21/2013   Hyperkalemia 05/21/2013   Hypothermia 05/21/2013   Paraplegia (HCC) 06/25/2012   Suprapubic catheter (HCC) 06/25/2012   Decubitus ulcer of buttock 06/25/2012   PCP:  Richarda Osmond, FNP Pharmacy:   [facility] [place] [place] - [address] [place] [address] Phone: [phone] Fax: [phone]     Social Determinants of Health (SDOH) Social History: SDOH Screenings   Food Insecurity: No Food Insecurity (12/14/2022)  Housing: Patient Declined (12/14/2022)  Transportation Needs: No Transportation Needs (12/14/2022)  Utilities: Not At Risk (12/14/2022)  Tobacco Use: Low Risk  (12/13/2022)   SDOH Interventions:   Readmission Risk Interventions    12/17/2022   11:39 AM 11/09/2022    5:22 PM  Readmission Risk Prevention Plan  Transportation Screening Complete Complete  Medication Review [employer]) Complete Complete  PCP or Specialist appointment within 3-5 days of discharge Not Complete Complete  HRI or Home Care Consult Complete Complete  SW Recovery Care/Counseling Consult Complete Complete  Palliative Care Screening Not Applicable Not Applicable  Skilled  Nursing Facility Not Applicable Patient Refused

## 2022-12-17 NOTE — Plan of Care (Signed)
Pt nutrition continues to be minimal amounts. Pt refuses mobility at times, pt prefers to sleep on R or L side. Pt has remained somnolent and resting most of the shift. Tele visit with infectious disease today, abt therapy changed to Rocephin. Plan of care ongoing at this time.

## 2022-12-17 NOTE — Discharge Summary (Signed)
Physician Discharge Summary   Patient: [name] MRN: [id number] DOB: [birth year]  Admit date:     12/13/2022  Discharge date: 12/17/22  Discharge Physician: Kendell Bane   PCP: Richarda Osmond, FNP   Recommendations at discharge:  Preemptive discharge summary in case patient is excepted to [facility]  Patient is continue to follow-up with scheduled hemodialysis, IV antibiotics, consultants  Discharge Diagnoses: Principal Problem:   Sepsis (HCC) Active Problems:   Decubitus ulcers   Seizure disorder (HCC)   Essential hypertension   GERD (gastroesophageal reflux disease)   ESRD (end stage renal disease) (HCC)   Chronic pain  Resolved Problems:   * No resolved hospital problems. *  Hospital Course: [name] is a 57 y.o. female with medical history significant of hypertension, paraplegia, seizure disorder, suprapubic catheter, GERD, chronic pain, and more presents the ED with a chief complaint of fever and diarrhea.   Patient reports she had 1 episode of diarrhea that started in the a.m. on July 19.  She reports it was nonbloody.  She reports she also had a high fever.  Her family reported to her that the decubitus ulcers that she has were malodorous.  Patient reports that she is not able to assess those herself.  She reports that she has no feeling below her waist, so they have not been any more painful.  Patient reports that the wounds have been there for a long time either months or years she is not sure.  She reports the first time she felt feverish was during the night between the 18th and 19th.  She reports she has had a normal appetite.  She has not had any new weakness, nausea, dizziness.  She has not noticed any change in her urine.  Patient has no other complaints at this time.  Patient does live with her daughter at home.   Patient does not smoke.  She drinks occasionally.  She is vaccinated for COVID and flu.  Patient is full code.    ED Temp 102.9, heart rate 106, respiratory rate 24, blood pressure 185/88, satting 93% Leukocytosis 11.3, hemoglobin 10.2 Chemistry reveals a BUN of 44 and a creatinine of 5.6 Albumin 2.8 UA is indicative of UTI, urine culture pending Blood cultures pending CT abdomen pelvis shows multiple findings but most notably for bladder wall thickening, and possible osteomyelitis EKG shows a heart rate of 102, sinus tach, QTc 447 Admission requested for sepsis thought to be due to infected decubitus ulce  Assessment and Plan: * Sepsis (HCC) - Sepsis physiology resolved -Elevated blood pressure otherwise hemodynamically stable  -POA: Met sepsis criteria on admission with Temp 102.9, heart rate 106, respiratory rate 24  - Secondary to ? infected sacral decubitus ulcer vs UTI - POA: Buttocks decubitus-malodorous drainage - this morning the wound was cleaned, not draining dressing was in place - UA suspicious for UTI but patient does have chronic suprapubic catheter  - Blood cultures/urine cultures >>  NGTD   - Previous blood cultures grew Enterococcus faecalis that was sensitive to vancomycin - Patient was started on vancomycin and cefepime in the ED, continue vancomycin  - CT abdomen pelvis with multiple findings including bladder wall thickening, and decubitus ulcers over left hip and ischial tuberosities with possible osteomyelitis  - MRI was completed, nondiagnostic, motion degradation but could not rule out septic arthritis, fluid collection, and osteomyelitis -ID Dr. Thedore Mins consulted appreciated the Dr. Thedore Mins consulted appreciated for evaluation recommendation  -Lactic acid 1.3, 1.0, -Procalcitonin  2.21, - ESR is elevated at 57,    -Continue current antibiotics: Vanco plus cefepime  Continue Vancomycin 500mg  after HD on MWF  -Discussed with orthopedic team, ID, called tertiary care [facility], [facility] they do not any bed available at this time. Reached out and  surgeon: There are no plastic surgery available Discussing with interventional radiology for possible drainage hip   At this point Patient's option is only antibiotics per ID  - Continue to monitor  Decubitus ulcers - X 2 sacral decubitus stage IV left greater than right Left 1 tunneling - Possible source of infection - Continue vancomycin + cefepime - Wound consult -appreciate wound care  -MRI 12/16/2022 -nondiagnostic but revealing -sacral decubitus ulcersleft greater trochanter, concerning for osteomyelitis. Partially imaged simple-appearing fluid collection adjacent to the distal sacrum on the right measuring 3.7 x 1.9 cm. 4. Moderate-sized right hip joint effusion. Septic arthritis not excluded. 5. Generalized soft tissue edema.  Discussed the case in detail with Dr. Thedore Mins appreciate further input and antibiotics recommendation  Discussed the case with Dr. Romeo Apple who feels that the patient needs orthopedic/infectious disease/plastic surgery intervention  -Discussed with Dr. Lajoyce Corners he will not be able to see or help this patient  -Start uninvestigated plastic surgery is not available at [facility]  -I have reached out to [facility] multiple times for transfer unfortunately do not have a bed available for this patient to be transferred  -Patient's only option is IV antibiotics for now  -Reached out to interventional radiologist at [facility] for possible drainage per ID recommendation  - Continue to monitor  Chronic pain - Continue buprenorphine  ESRD (end stage renal disease) (HCC) - Monday/Wednesday/Friday dialysis schedule - Did not get dialyzed on 7/19 due to diarrhea - Consult nephro for HD - Nephrology following closely -Dialysis  12/15/2022>> hemodialysis in a.m. - Continue to monitor  GERD (gastroesophageal reflux disease) - Continue Protonix  Essential hypertension - Continue Norvasc, Coreg, clonidine, ARB -Blood pressure elevated,  Norvasc increased from 5 to 10 mg resuming the rest   Seizure disorder (HCC) - Continue Keppra        Consultants: Infectious disease/orthopedics/nephrology Procedures performed: MRI of the hip Disposition:  Transfer to tertiary care Diet recommendation:  Discharge Diet Orders (From admission, onward)     Start     Ordered   12/17/22 0000  Diet - low sodium heart healthy        12/17/22 1642           Regular diet DISCHARGE MEDICATION: Allergies as of 12/17/2022       Reactions   Benadryl [diphenhydramine Hcl (sleep)] Hives   Daptomycin Hives   Linezolid    Other Reaction(s): GI Intolerance Patient self-discontinued treatment due to GI intolerance. Taking it along with moxifloxacin   Moxifloxacin    Other Reaction(s): GI Intolerance Patient self-discontinued treatment due to GI intolerance. Taking it along with linezolid   Quinine Derivatives Other (See Comments)   Alters mental status   Vancomycin    Pt is tolerating this medication at HD   Azithromycin Itching, Rash   Tetracycline Itching   Able to tolerate Doxycycline.    Zosyn [piperacillin Sod-tazobactam So] Rash        Medication List     TAKE these medications    acetaminophen 500 MG tablet Commonly known as: TYLENOL Take 1,000 mg by mouth every 6 (six) hours as needed for moderate pain.   albuterol 108 (90  Base) MCG/ACT inhaler Commonly known as: VENTOLIN HFA Inhale 2 puffs into the lungs every 4 (four) hours as needed for wheezing or shortness of breath.   amLODipine 5 MG tablet Commonly known as: NORVASC Take 1 tablet (5 mg total) by mouth daily. Take 1 tablet on Monday,Wednesday and Friday then take 2 tablets on Tuesday,Thursday,Saturday and Sunday. What changed: when to take this   buprenorphine 7.5 MCG/HR Commonly known as: BUTRANS Place 1 patch onto the skin once a week.   carvedilol 6.25 MG tablet Commonly known as: COREG Take 6.25 mg by mouth 2 (two) times daily with a meal.    cefTRIAXone 2 g in sodium chloride 0.9 % 100 mL Inject 2 g into the vein daily. Start taking on: December 18, 2022   cloNIDine 0.1 MG tablet Commonly known as: CATAPRES Take 1 tablet (0.1 mg total) by mouth 2 (two) times daily.   furosemide 20 MG tablet Commonly known as: LASIX Take 20 mg by mouth daily.   irbesartan 150 MG tablet Commonly known as: AVAPRO Take 150 mg by mouth daily at 6 PM.   levETIRAcetam 500 MG tablet Commonly known as: KEPPRA Take 1,000 mg by mouth every evening. Take an additional tablet on Monday,Wednesday and Friday evening after Dialysis   lidocaine-prilocaine cream Commonly known as: EMLA Apply 1 Application topically 3 (three) times a week.   naloxone 4 MG/0.1ML Liqd nasal spray kit Commonly known as: NARCAN Place 0.4 mg into the nose once.   pantoprazole 40 MG tablet Commonly known as: PROTONIX Take 40 mg by mouth daily.   RENA-VITE RX PO Take 1 tablet by mouth every morning.   senna-docusate 8.6-50 MG tablet Commonly known as: Senokot-S Take 2 tablets by mouth at bedtime.   sevelamer carbonate 800 MG tablet Commonly known as: RENVELA Take 800 mg by mouth 3 (three) times daily.   vancomycin 500 MG/100ML IVPB Commonly known as: VANCOREADY Inject 100 mLs (500 mg total) into the vein every Monday, Wednesday, and Friday with hemodialysis. Start taking on: December 18, 2022               Discharge Care Instructions  (From admission, onward)           Start     Ordered   12/17/22 0000  Discharge wound care:       Comments: Per wound care RN   12/17/22 1642            Discharge Exam: Filed Weights   12/14/22 0103 12/14/22 0503 12/15/22 0442  Weight: 44.7 kg 45 kg 46.7 kg        General:  AAO x 3,  cooperative, no distress;   HEENT:  Normocephalic, PERRL, otherwise with in Normal limits   Neuro:  CNII-XII intact. , normal motor and sensation, reflexes intact   Lungs:   Clear to auscultation BL, Respirations unlabored,   No wheezes / crackles  Cardio:    S1/S2, RRR, No murmure, No Rubs or Gallops   Abdomen:  Soft, non-tender, bowel sounds active all four quadrants, no guarding or peritoneal signs.  Muscular  skeletal:  Paraplegic, contracted lower extremity, 2 large stage IV decubitus Limited exam -global generalized weaknesses - in bed, able to move all 4 extremities,   2+ pulses,  symmetric, No pitting edema  Skin:  Dry, warm to touch, negative for any Rashes,  Wounds:   Pressure Injury 02/26/22 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 (  Active)  02/26/22 0120  Location: Ischial tuberosity  Location Orientation: Left  Staging: Stage 4 - Full thickness tissue loss with exposed bone, tendon or muscle.  Wound Description (Comments):   Present on Admission: Yes  Dressing Type Foam - Lift dressing to assess site every shift 12/17/22 0912     Pressure Injury 02/26/22 Ischial tuberosity Right Stage 4 - Full thickness tissue loss with exposed bone, tendon or muscle. (Active)  02/26/22 0120  Location: Ischial tuberosity  Location Orientation: Right  Staging: Stage 4 - Full thickness tissue loss with exposed bone, tendon or muscle.  Wound Description (Comments):   Present on Admission: Yes  Dressing Type Gauze (Comment) 12/16/22 2214          Condition at discharge: stable  The results of significant diagnostics from this hospitalization (including imaging, microbiology, ancillary and laboratory) are listed below for reference.   Imaging Studies: MR PELVIS WO CONTRAST  Result Date: 12/16/2022 CLINICAL DATA:  Osteomyelitis suspected, pelvis, no prior imaging EXAM: MRI PELVIS WITHOUT CONTRAST TECHNIQUE: Multiplanar multisequence MR imaging of the pelvis was performed. No intravenous contrast was administered. COMPARISON:  CT 12/13/2022 FINDINGS: Technical note: Incomplete, essentially nondiagnostic exam as only 4 motion degraded sequences were  able to be obtained prior to patient terminating the study secondary to discomfort. Limited dictation as follows: Decubitus ulcers in the region of the sacrum, bilateral ischial tuberosities, and left femoral greater trochanter. There is evidence of bone marrow edema within the left greater trochanter. There is fluid within the soft tissues overlying the left greater trochanter. Portions of the sacrum and coccyx of view were not entirely imaged within the field. Partially imaged simple-appearing fluid collection adjacent to the distal sacrum on the right measuring 3.7 x 1.9 cm. There is a moderate-sized right hip joint effusion. Generalized soft tissue edema. A suprapubic catheter is evident within the bladder. IMPRESSION: 1. Incomplete, essentially non-diagnostic exam. See above discussion. 2. Decubitus ulcers in the region of the sacrum, bilateral ischial tuberosities, and left femoral greater trochanter. There is evidence of bone marrow edema within the left greater trochanter, concerning for osteomyelitis. 3. Partially imaged simple-appearing fluid collection adjacent to the distal sacrum on the right measuring 3.7 x 1.9 cm. 4. Moderate-sized right hip joint effusion. Septic arthritis not excluded. 5. Generalized soft tissue edema. Electronically Signed   By: Duanne Guess D.O.   On: 12/16/2022 18:34   CT CHEST ABDOMEN PELVIS WO CONTRAST  Result Date: 12/13/2022 CLINICAL DATA:  Sepsis.  Fever and diarrhea. EXAM: CT CHEST, ABDOMEN AND PELVIS WITHOUT CONTRAST TECHNIQUE: Multidetector CT imaging of the chest, abdomen and pelvis was performed following the standard protocol without IV contrast. RADIATION DOSE REDUCTION: This exam was performed according to the departmental dose-optimization program which includes automated exposure control, adjustment of the mA and/or kV according to patient size and/or use of iterative reconstruction technique. COMPARISON:  11/10/2022. FINDINGS: CT CHEST FINDINGS  Cardiovascular: The heart is enlarged and there is a small pericardial effusion. A few coronary artery calcifications are noted. There is atherosclerotic calcification of the aorta without evidence of aneurysm. The pulmonary trunk is distended suggesting underlying pulmonary artery hypertension. Mediastinum/Nodes: No mediastinal or axillary lymphadenopathy. Evaluation of the hila is limited due to lack of IV contrast. Thyroid gland, trachea, and esophagus are within normal limits. Lungs/Pleura: Small pleural effusion is present on the right. Patchy atelectasis or infiltrate is noted in the in the lungs bilaterally. No pneumothorax. Musculoskeletal: Degenerative changes and fusion hardware are noted in the thoracic  spine. There is bony deformity of the proximal sternum likely related to old trauma. There stable anterolisthesis at T7-T8. No acute osseous abnormality. CT ABDOMEN PELVIS FINDINGS Hepatobiliary: No focal liver abnormality is seen. No gallstones, gallbladder wall thickening, or biliary dilatation. Pancreas: Unremarkable. No pancreatic ductal dilatation or surrounding inflammatory changes. Spleen: Normal in size without focal abnormality. Adrenals/Urinary Tract: The adrenal glands are within normal limits. Renal atrophy is noted bilaterally. No renal calculus or hydronephrosis. A suprapubic catheter is noted in the urinary bladder. There is diffuse bladder wall thickening. Stomach/Bowel: Stomach is within normal limits. Appendix is not seen. No evidence of bowel wall thickening, distention, or inflammatory changes. No free air or pneumatosis. Vascular/Lymphatic: Aortic atherosclerosis. No enlarged abdominal or pelvic lymph nodes. Reproductive: Uterus and bilateral adnexa are unremarkable. Other: No abdominopelvic ascites. Skin defects and soft tissue thickening are noted in the ischial regions bilaterally and over the left hip. No obvious abscess is seen. A 5.7 x 2.4 cm hypodense collection with  calcifications is noted posterior to the sacrum on the right, increased in size from the prior exam. Musculoskeletal: Degenerative changes are present in the lumbar spine. No acute osseous abnormality is seen. A stable joint effusion is noted at the right hip. IMPRESSION: 1. Small right pleural effusion with patchy atelectasis or infiltrates in the lungs bilaterally. 2. Cardiomegaly with small pericardial effusion. 3. Diffuse bladder wall thickening with suprapubic catheter in place, possible infectious or inflammatory cystitis. 4. Bilateral renal atrophy. 5. Decubitus ulcers over the left hip, and ischial tuberosities. Collection containing calcifications posterior to the sacrum and coccyx, increased in size from the prior exam. Consider MRI for further evaluation if there is clinical concern for osteomyelitis. Electronically Signed   By: Thornell Sartorius M.D.   On: 12/13/2022 22:27    Microbiology: Results for orders placed or performed during the hospital encounter of 12/13/22  Urine Culture (for pregnant, neutropenic or urologic patients or patients with an indwelling urinary catheter)     Status: Abnormal   Collection Time: 12/13/22  9:15 PM   Specimen: Urine, Clean Catch  Result Value Ref Range Status   Specimen Description   Final    URINE, CLEAN CATCH Performed at [facility], [address], [place], [place] [address]    Special Requests   Final    NONE Performed at [facility], [address] [place], [place] [address]    Culture MULTIPLE SPECIES PRESENT, SUGGEST RECOLLECTION (A)  Final   Report Status 12/15/2022 FINAL  Final  Culture, blood (Routine x 2)     Status: None (Preliminary result)   Collection Time: 12/13/22  9:29 PM   Specimen: BLOOD RIGHT HAND  Result Value Ref Range Status   Specimen Description BLOOD RIGHT HAND  Final   Special Requests   Final    BOTTLES DRAWN AEROBIC AND ANAEROBIC Blood Culture adequate volume   Culture   Final    NO GROWTH 4 DAYS Performed at  [facility], [address] [place], [place] [address]    Report Status PENDING  Incomplete  Culture, blood (Routine x 2)     Status: None (Preliminary result)   Collection Time: 12/13/22  9:36 PM   Specimen: Right Antecubital; Blood  Result Value Ref Range Status   Specimen Description RIGHT ANTECUBITAL  Final   Special Requests   Final    BOTTLES DRAWN AEROBIC AND ANAEROBIC Blood Culture results may not be optimal due to an excessive volume of blood received in culture bottles  Culture   Final    NO GROWTH 4 DAYS Performed at [facility], [address], [place], [place] [address]    Report Status PENDING  Incomplete  MRSA Next Gen by PCR, Nasal     Status: None   Collection Time: 12/14/22  1:05 AM   Specimen: Nasal Mucosa; Nasal Swab  Result Value Ref Range Status   MRSA by PCR Next Gen NOT DETECTED NOT DETECTED Final    Comment: (NOTE) The GeneXpert MRSA Assay (FDA approved for NASAL specimens only), is one component of a comprehensive MRSA colonization surveillance program. It is not intended to diagnose MRSA infection nor to guide or monitor treatment for MRSA infections. Test performance is not FDA approved in patients less than 52 years old. Performed at [facility], [address] [place], [place] [address]     Labs: CBC: Recent Labs  Lab 12/13/22 2100 12/14/22 0445 12/15/22 0435 12/16/22 0459 12/17/22 0317  WBC 11.3* 9.6 7.1 5.7 4.5  NEUTROABS 9.9* 8.2*  --   --   --   HGB 10.2* 9.0* 9.1* 9.5* 9.3*  HCT 34.5* 29.9* 30.2* 31.9* 30.8*  MCV 97.5 98.4 96.2 95.5 95.4  PLT 169 147* 145* 170 170   Basic Metabolic Panel: Recent Labs  Lab 12/13/22 2100 12/14/22 0445 12/15/22 0435 12/16/22 0459 12/17/22 0317  NA 134* 135 129* 134* 133*  K 4.2 3.8 4.9 4.7 4.3  CL 100 104 100 97* 97*  CO2 22 20* 16* 25 22  GLUCOSE 128* 118* 92 93 90  BUN 44* 52* 61* 37* 43*  CREATININE 5.86* 6.12* 6.58* 4.04* 4.97*  CALCIUM 7.9* 8.0* 7.9* 8.2* 8.5*  MG  --  2.0  --   --    --   PHOS  --  5.2*  --   --   --    Liver Function Tests: Recent Labs  Lab 12/13/22 2100 12/14/22 0445 12/15/22 0435 12/16/22 0459 12/17/22 0317  AST 19 20 16 16  13*  ALT 13 12 15 13 13   ALKPHOS 80 65 68 70 65  BILITOT 0.8 1.0 0.9 0.8 0.5  PROT 7.2 6.3* 6.3* 7.0 6.5  ALBUMIN 2.8* 2.5* 2.4* 2.6* 2.4*   CBG: No results for input(s): "GLUCAP" in the last 168 hours.  Discharge time spent: greater than 40 minutes.  Signed: Kendell Bane, MD [facility] 12/17/2022

## 2022-12-17 NOTE — Progress Notes (Signed)
Admit: 12/13/2022 LOS: 4  36F ESRD MWF [facility] with sepsis and concern for infected decubitus ulcer  Subjective:  MRI yesterday 'essentially nondiagnostic' No c/o this AM BPs stable On Vanc/Cefepime No fevers Labs stable this AM, K 4.7, 1.7L UOP  07/22 0701 - 07/23 0700 In: 1873.2 [P.O.:840; IV Piggyback:1033.2] Out: 1650 [Urine:1650]  Filed Weights   12/14/22 0103 12/14/22 0503 12/15/22 0442  Weight: 44.7 kg 45 kg 46.7 kg    Scheduled Meds:  amLODipine  10 mg Oral Daily   buprenorphine  1 patch Transdermal Weekly   carvedilol  6.25 mg Oral BID WC   Chlorhexidine Gluconate Cloth  6 each Topical Daily   cloNIDine  0.1 mg Oral BID   heparin  5,000 Units Subcutaneous Q8H   irbesartan  150 mg Oral q1800   levETIRAcetam  1,000 mg Oral QPM   Continuous Infusions:  ceFEPime (MAXIPIME) IV 1 g (12/16/22 1418)   vancomycin Stopped (12/16/22 1529)   PRN Meds:.acetaminophen **OR** acetaminophen, albuterol, alum & mag hydroxide-simeth, labetalol, methocarbamol, ondansetron **OR** ondansetron (ZOFRAN) IV, oxyCODONE  Current Labs: reviewed   Physical Exam:  Blood pressure (!) 170/86, pulse 74, temperature 97.9 F (36.6 C), temperature source Oral, resp. rate 20, height 5\' 3"  (1.6 m), weight 46.7 kg, SpO2 98%. NAD lying in bed LUE AVF +B/T CTAB NO rub, regular  A ESRD MWF LUE AVF [facility] using LUE AVF Sepsis, resolving Chronic decubitus ulcer concern for infection on Vanc/Cefepime; BCx NGTD Anemia, Hb 9s, stable CKDBMM: P and Ca stable HTN/Vol: BPs variable, trend.  On RA  P She had HD Sunday and asks to wait until tomorrow. Good UOP/Vol status and labs ok.  WIll do HD tomorrow back on usual schedule Will follow along Medication Issues; Preferred narcotic agents for pain control are hydromorphone, fentanyl, and methadone. Morphine should not be used.  Baclofen should be avoided Avoid oral sodium phosphate and magnesium citrate based laxatives / bowel preps     Sabra Heck MD 12/17/2022, 8:10 AM  Recent Labs  Lab 12/14/22 0445 12/15/22 0435 12/16/22 0459 12/17/22 0317  NA 135 129* 134* 133*  K 3.8 4.9 4.7 4.3  CL 104 100 97* 97*  CO2 20* 16* 25 22  GLUCOSE 118* 92 93 90  BUN 52* 61* 37* 43*  CREATININE 6.12* 6.58* 4.04* 4.97*  CALCIUM 8.0* 7.9* 8.2* 8.5*  PHOS 5.2*  --   --   --    Recent Labs  Lab 12/13/22 2100 12/14/22 0445 12/15/22 0435 12/16/22 0459 12/17/22 0317  WBC 11.3* 9.6 7.1 5.7 4.5  NEUTROABS 9.9* 8.2*  --   --   --   HGB 10.2* 9.0* 9.1* 9.5* 9.3*  HCT 34.5* 29.9* 30.2* 31.9* 30.8*  MCV 97.5 98.4 96.2 95.5 95.4  PLT 169 147* 145* 170 170

## 2022-12-17 NOTE — Progress Notes (Signed)
Patient ID: [name], female   DOB: September 08, 1965, 57 y.o.   MRN: [id number]  Preliminary opinion regarding the patient's condition  The patient has multiple medical problems as we all know  She needs to have a hip aspiration to determine if the fluid is indeed infectious  She also needs a multidisciplinary approach which would involve  Orthopedic surgery  Infectious disease  Plastic surgery  Her case is best handled at a tertiary care facility

## 2022-12-18 ENCOUNTER — Inpatient Hospital Stay (HOSPITAL_COMMUNITY): Payer: 59

## 2022-12-18 DIAGNOSIS — A419 Sepsis, unspecified organism: Secondary | ICD-10-CM

## 2022-12-18 DIAGNOSIS — M869 Osteomyelitis, unspecified: Secondary | ICD-10-CM | POA: Diagnosis not present

## 2022-12-18 DIAGNOSIS — N186 End stage renal disease: Secondary | ICD-10-CM | POA: Diagnosis not present

## 2022-12-18 DIAGNOSIS — G40909 Epilepsy, unspecified, not intractable, without status epilepticus: Secondary | ICD-10-CM | POA: Diagnosis not present

## 2022-12-18 LAB — RENAL FUNCTION PANEL
Albumin: 2.4 g/dL — ABNORMAL LOW (ref 3.5–5.0)
Anion gap: 15 (ref 5–15)
BUN: 50 mg/dL — ABNORMAL HIGH (ref 6–20)
CO2: 17 mmol/L — ABNORMAL LOW (ref 22–32)
Calcium: 8.4 mg/dL — ABNORMAL LOW (ref 8.9–10.3)
Chloride: 99 mmol/L (ref 98–111)
Creatinine, Ser: 6.66 mg/dL — ABNORMAL HIGH (ref 0.44–1.00)
GFR, Estimated: 7 mL/min — ABNORMAL LOW (ref >60)
Glucose, Bld: 122 mg/dL — ABNORMAL HIGH (ref 70–99)
Phosphorus: 5.3 mg/dL — ABNORMAL HIGH (ref 2.5–4.6)
Potassium: 4.9 mmol/L (ref 3.5–5.1)
Sodium: 131 mmol/L — ABNORMAL LOW (ref 135–145)

## 2022-12-18 LAB — CBC
HCT: 32.4 % — ABNORMAL LOW (ref 36.0–46.0)
Hemoglobin: 9.7 g/dL — ABNORMAL LOW (ref 12.0–15.0)
MCH: 28.4 pg (ref 26.0–34.0)
MCHC: 29.9 g/dL — ABNORMAL LOW (ref 30.0–36.0)
MCV: 94.7 fL (ref 80.0–100.0)
Platelets: 191 10*3/uL (ref 150–400)
RBC: 3.42 MIL/uL — ABNORMAL LOW (ref 3.87–5.11)
RDW: 16.7 % — ABNORMAL HIGH (ref 11.5–15.5)
WBC: 5.9 10*3/uL (ref 4.0–10.5)
nRBC: 0 % (ref 0.0–0.2)

## 2022-12-18 LAB — BODY FLUID CULTURE W GRAM STAIN: Gram Stain: NONE SEEN

## 2022-12-18 LAB — SYNOVIAL CELL COUNT + DIFF, W/ CRYSTALS
Crystals, Fluid: NONE SEEN
Eosinophils-Synovial: 3 % — ABNORMAL HIGH (ref 0–1)
Lymphocytes-Synovial Fld: 35 % — ABNORMAL HIGH (ref 0–20)
Monocyte-Macrophage-Synovial Fluid: 19 % — ABNORMAL LOW (ref 50–90)
Neutrophil, Synovial: 43 % — ABNORMAL HIGH (ref 0–25)
WBC, Synovial: 83 /mm3 (ref 0–200)

## 2022-12-18 LAB — GLUCOSE, CAPILLARY
Glucose-Capillary: 95 mg/dL (ref 70–99)
Glucose-Capillary: 98 mg/dL (ref 70–99)

## 2022-12-18 MED ORDER — LIDOCAINE HCL (PF) 1 % IJ SOLN
INTRAMUSCULAR | Status: AC
  Filled 2022-12-18: qty 5

## 2022-12-18 MED ORDER — DARBEPOETIN ALFA 60 MCG/0.3ML IJ SOSY
60.0000 ug | PREFILLED_SYRINGE | INTRAMUSCULAR | Status: DC
  Administered 2022-12-18: 60 ug via SUBCUTANEOUS
  Filled 2022-12-18: qty 0.3

## 2022-12-18 MED ORDER — ORAL CARE MOUTH RINSE: 15.0000 mL | OROMUCOSAL | Status: DC | PRN

## 2022-12-18 MED ORDER — ACETAMINOPHEN 325 MG PO TABS
ORAL_TABLET | ORAL | Status: AC
  Filled 2022-12-18: qty 2

## 2022-12-18 MED ORDER — LIDOCAINE HCL (PF) 1 % IJ SOLN
5.0000 mL | Freq: Once | INTRAMUSCULAR | Status: AC
  Administered 2022-12-18: 5 mL via INTRADERMAL

## 2022-12-18 NOTE — Progress Notes (Signed)
Admit: 12/13/2022 LOS: 5  [name] ESRD MWF [facility] with sepsis and concern for infected decubitus ulcer  Subjective:  MRI  nondiagnostic No c/o this AM BPs stable to high On Vanc/rocephin now  No fevers Labs stable this AM, K 4.3, 1.7L UOP-- due for routine HD today -   there is discharge summary in the chart but pt not sure of disposition   07/23 0701 - 07/24 0700 In: 480 [P.O.:480] Out: 1650 [Urine:1650]  Filed Weights   12/14/22 0103 12/14/22 0503 12/15/22 0442  Weight: 44.7 kg 45 kg 46.7 kg    Scheduled Meds:  amLODipine  10 mg Oral Daily   buprenorphine  1 patch Transdermal Weekly   carvedilol  6.25 mg Oral BID WC   Chlorhexidine Gluconate Cloth  6 each Topical Daily   cloNIDine  0.1 mg Oral BID   heparin  5,000 Units Subcutaneous Q8H   irbesartan  150 mg Oral q1800   levETIRAcetam  1,000 mg Oral QPM   Continuous Infusions:  cefTRIAXone (ROCEPHIN)  IV 2 g (12/17/22 1538)   vancomycin Stopped (12/16/22 1529)   PRN Meds:.acetaminophen **OR** acetaminophen, albuterol, alum & mag hydroxide-simeth, labetalol, methocarbamol, ondansetron **OR** ondansetron (ZOFRAN) IV, oxyCODONE  Current Labs: reviewed   Physical Exam:  Blood pressure (!) 184/91, pulse 83, temperature 98.3 F (36.8 C), temperature source Oral, resp. rate 20, height 5\' 3"  (1.6 m), weight 46.7 kg, SpO2 93%. NAD lying in bed LUE AVF +B/T CTAB NO rub, regular  A ESRD MWF LUE AVF [facility] using LUE AVF Sepsis, resolving-  to continue IV abx at OP HD center Chronic decubitus ulcer concern for infection on Vanc/rocephin; BCx NGTD Anemia, Hb 9s, stable-  no ESA here as of yet-  will add  CKDBMM: P and Ca stable-- no binder HTN/Vol: BPs variable, trend.  On RA-  on many BP meds- UF with HD today   P  HD today back on usual schedule Will follow along Medication Issues; Preferred narcotic agents for pain control are hydromorphone, fentanyl, and methadone. Morphine should not be used.  Baclofen  should be avoided Avoid oral sodium phosphate and magnesium citrate based laxatives / bowel preps    [name]  12/18/2022, 8:19 AM  Recent Labs  Lab 12/14/22 0445 12/15/22 0435 12/16/22 0459 12/17/22 0317  NA 135 129* 134* 133*  K 3.8 4.9 4.7 4.3  CL 104 100 97* 97*  CO2 20* 16* 25 22  GLUCOSE 118* 92 93 90  BUN 52* 61* 37* 43*  CREATININE 6.12* 6.58* 4.04* 4.97*  CALCIUM 8.0* 7.9* 8.2* 8.5*  PHOS 5.2*  --   --   --    Recent Labs  Lab 12/13/22 2100 12/14/22 0445 12/15/22 0435 12/16/22 0459 12/17/22 0317 12/18/22 0324  WBC 11.3* 9.6   < > 5.7 4.5 5.9  NEUTROABS 9.9* 8.2*  --   --   --   --   HGB 10.2* 9.0*   < > 9.5* 9.3* 9.7*  HCT 34.5* 29.9*   < > 31.9* 30.8* 32.4*  MCV 97.5 98.4   < > 95.5 95.4 94.7  PLT 169 147*   < > 170 170 191   < > = values in this interval not displayed.
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
## 2022-12-18 NOTE — Progress Notes (Signed)
   HEMODIALYSIS TREATMENT NOTE:  Unable to tolerate any UF d/t symptomatic hypotension relieved with NS boluses.  3 hour treatment completed.  All blood was returned.  Post-treatment:  12/18/22 1750  Vitals  Temp 97.9 F (36.6 C)  Temp Source Oral  BP 130/68  MAP (mmHg) 88  BP Location Right Wrist  BP Method Automatic  Patient Position (if appropriate) Lying  Pulse Rate 92  Pulse Rate Source Monitor  Resp 18  Oxygen Therapy  SpO2 99 %  O2 Device Room Air  Post Treatment  Dialyzer Clearance Lightly streaked  Duration of HD Treatment -hour(s) 3 hour(s)  Hemodialysis Intake (mL) 500 mL  Liters Processed 60.8  Fluid Removed (mL) 0 mL  Tolerated HD Treatment No (Comment)  Post-Hemodialysis Comments Unable to UF d/t symptomatic hypotension.  AVG/AVF Arterial Site Held (minutes) 5 minutes  AVG/AVF Venous Site Held (minutes) 5 minutes  Fistula / Graft Left Upper arm Arteriovenous fistula  No placement date or time found.   Placed prior to admission: Yes  Orientation: Left  Access Location: Upper arm  Access Type: Arteriovenous fistula  Fistula / Graft Assessment Thrill;Bruit  Status Patent    Arman Filter, RN AP KDU

## 2022-12-18 NOTE — Procedures (Signed)
Interventional Radiology Procedure Note  Procedure: fluoro rt hip aspiration    Complications: None  Estimated Blood Loss:  min  Findings: 23cc  blood tinged synovial fld.  Non purulent  Sharen Counter, MD

## 2022-12-18 NOTE — Plan of Care (Signed)
  Problem: Education: Goal: Knowledge of General Education information will improve Description: Including pain rating scale, medication(s)/side effects and non-pharmacologic comfort measures Outcome: Progressing   Problem: Health Behavior/Discharge Planning: Goal: Ability to manage health-related needs will improve Outcome: Progressing   Problem: Clinical Measurements: Goal: Ability to maintain clinical measurements within normal limits will improve Outcome: Progressing Goal: Will remain free from infection Outcome: Progressing Goal: Diagnostic test results will improve Outcome: Progressing Goal: Respiratory complications will improve Outcome: Progressing Goal: Cardiovascular complication will be avoided Outcome: Progressing   Problem: Activity: Goal: Risk for activity intolerance will decrease Outcome: Progressing   Problem: Nutrition: Goal: Adequate nutrition will be maintained Outcome: Progressing   Problem: Coping: Goal: Level of anxiety will decrease Outcome: Progressing   Problem: Pain Managment: Goal: General experience of comfort will improve Outcome: Progressing   Problem: Safety: Goal: Ability to remain free from injury will improve Outcome: Progressing   Problem: Skin Integrity: Goal: Risk for impaired skin integrity will decrease Outcome: Progressing   Problem: Fluid Volume: Goal: Hemodynamic stability will improve Outcome: Progressing   Problem: Clinical Measurements: Goal: Diagnostic test results will improve Outcome: Progressing Goal: Signs and symptoms of infection will decrease Outcome: Progressing   Problem: Respiratory: Goal: Ability to maintain adequate ventilation will improve Outcome: Progressing

## 2022-12-18 NOTE — Plan of Care (Signed)
  Problem: Acute Rehab PT Goals(only PT should resolve) Goal: Pt will Roll Supine to Side Outcome: Progressing Flowsheets (Taken 12/18/2022 1229) Pt will Roll Supine to Side:  Independently  with modified independence Goal: Pt Will Go Supine/Side To Sit Outcome: Progressing Flowsheets (Taken 12/18/2022 1229) Pt will go Supine/Side to Sit:  Independently  with modified independence Goal: Pt Will Go Sit To Supine/Side Outcome: Progressing Flowsheets (Taken 12/18/2022 1229) Pt will go Sit to Supine/Side:  Independently  with modified independence Goal: Patient Will Perform Sitting Balance Outcome: Progressing Flowsheets (Taken 12/18/2022 1229) Patient will perform sitting balance:  Independently  with modified independence Goal: Pt Will Transfer Bed To Chair/Chair To Bed Outcome: Progressing   12:30 PM, 12/18/22 Ocie Bob, MPT Physical Therapist with [facility] [phone] office [phone] mobile phone

## 2022-12-18 NOTE — Evaluation (Signed)
Occupational Therapy Evaluation Patient Details Name: [name] MRN: [id number] DOB: Jul 12, 1965 Today's Date: 12/18/2022   History of Present Illness [name] is a 57 y.o. female with medical history significant of hypertension, paraplegia, seizure disorder, suprapubic catheter, GERD, chronic pain, and more presents the ED with a chief complaint of fever and diarrhea.  Patient reports she had 1 episode of diarrhea that started in the a.m. on July 19.  She reports it was nonbloody.  She reports she also had a high fever.  Her family reported to her that the decubitus ulcers that she has were malodorous.  Patient reports that she is not able to assess those herself.  She reports that she has no feeling below her waist, so they have not been any more painful.  Patient reports that the wounds have been there for a long time either months or years she is not sure.  She reports the first time she felt feverish was during the night between the 18th and 19th.  She reports she has had a normal appetite.  She has not had any new weakness, nausea, dizziness.  She has not noticed any change in her urine.  Patient has no other complaints at this time.  Patient does live with her daughter at home. (per DO)   Clinical Impression   Pt agreeable to OT and PT co-evaluation. Pt reports no concerns with ADL's. Independent at baseline for ADL's with use of hoyer lift. Primary complaint is stiffness of B LE. Pt present and stretching pt. After stretches pt was able to sit up in bed for a short duration. Deferring to PT for B LE needs. Pt is not recommended for further acute OT services and will be discharged to care of nursing staff for remaining length of stay.      Recommendations for follow up therapy are one component of a multi-disciplinary discharge planning process, led by the attending physician.  Recommendations may be updated based on patient status, additional functional criteria and insurance  authorization.   Assistance Recommended at Discharge PRN        Functional Status Assessment  Patient has not had a recent decline in their functional status  Equipment Recommendations  None recommended by OT           Precautions / Restrictions Precautions Precautions: Fall Restrictions Weight Bearing Restrictions: No      Mobility Bed Mobility Overal bed mobility: Modified Independent             General bed mobility comments: Able to sit up in bed without assist after stretches. Pt fatigued and went back to supine after 30 seconds to a minute.    Transfers                          Balance Overall balance assessment: Needs assistance Sitting-balance support: Bilateral upper extremity supported Sitting balance-Leahy Scale: Fair Sitting balance - Comments: Sat up support in bed; quick to fatigue.                                   ADL either performed or assessed with clinical judgement   ADL Overall ADL's : Modified independent  General ADL Comments: Pt has no concerns with her ADL's. Able to sit up in bed after stretches to B LE from PT.     Vision Baseline Vision/History: 1 Wears glasses Ability to See in Adequate Light: 1 Impaired Patient Visual Report: No change from baseline Vision Assessment?: No apparent visual deficits                Pertinent Vitals/Pain Pain Assessment Pain Assessment: Faces Faces Pain Scale: Hurts even more Pain Location: back and B LE with stretches Pain Descriptors / Indicators: Grimacing, Guarding Pain Intervention(s): Limited activity within patient's tolerance, Monitored during session, Repositioned     Hand Dominance Right   Extremity/Trunk Assessment Upper Extremity Assessment Upper Extremity Assessment: Overall WFL for tasks assessed   Lower Extremity Assessment Lower Extremity Assessment: Defer to PT evaluation        Communication Communication Communication: No difficulties   Cognition Arousal/Alertness: Awake/alert Behavior During Therapy: WFL for tasks assessed/performed Overall Cognitive Status: Within Functional Limits for tasks assessed                                                        Home Living Family/patient expects to be discharged to:: Private residence Living Arrangements: Children Available Help at Discharge: Family;Available PRN/intermittently Type of Home: House Home Access: Ramped entrance     Home Layout: One level     Bathroom Shower/Tub: Other (comment) (w/c accessible shower)   Bathroom Toilet:  (Pt does not use a toilet; pt uses diapers.)     Home Equipment: Hospital bed;Wheelchair - Careers adviser (comment) (hospital bed)   Additional Comments: Pt's daughter is also her home nurse per chart review.      Prior Functioning/Environment Prior Level of Function : Needs assist       Physical Assist : Mobility (physical) Mobility (physical): Transfers   Mobility Comments: Hoyer lift at baseline. ADLs Comments: Indepdnent for bathing, dressing, grooming, and eating. Uses diaper for toileting.                                Co-evaluation PT/OT/SLP Co-Evaluation/Treatment: Yes Reason for Co-Treatment: To address functional/ADL transfers   OT goals addressed during session: ADL's and self-care                       End of Session    Activity Tolerance: Patient tolerated treatment well Patient left: in bed;with call bell/phone within reach  OT Visit Diagnosis: Muscle weakness (generalized) (M62.81)                Time: 5621-3086 OT Time Calculation (min): 15 min Charges:  OT General Charges $OT Visit: 1 Visit OT Evaluation $OT Eval Low Complexity: 1 Low  Maycol Hoying OT, MOT   [employer] 12/18/2022, 10:08 AM

## 2022-12-18 NOTE — Evaluation (Signed)
Physical Therapy Evaluation Patient Details Name: [name] MRN: [id number] DOB: 1966/04/07 Today's Date: 12/18/2022  History of Present Illness  [name] is a 57 y.o. female with medical history significant of hypertension, paraplegia, seizure disorder, suprapubic catheter, GERD, chronic pain, and more presents the ED with a chief complaint of fever and diarrhea.  Patient reports she had 1 episode of diarrhea that started in the a.m. on July 19.  She reports it was nonbloody.  She reports she also had a high fever.  Her family reported to her that the decubitus ulcers that she has were malodorous.  Patient reports that she is not able to assess those herself.  She reports that she has no feeling below her waist, so they have not been any more painful.  Patient reports that the wounds have been there for a long time either months or years she is not sure.  She reports the first time she felt feverish was during the night between the 18th and 19th.  She reports she has had a normal appetite.  She has not had any new weakness, nausea, dizziness.  She has not noticed any change in her urine.  Patient has no other complaints at this time.  Patient does live with her daughter at home.   Clinical Impression  Patient demonstrates good return for rolling side to side and long sitting in bed with labored movement having to support self with BUE, tolerated stretching to bilateral hamstrings with c/o severe pain and had to lie down due to c/o fatigue after functional activity.  Patient will benefit from continued skilled physical therapy in hospital and recommended venue below to increase strength, balance, endurance for safe ADLs and gait.         Assistance Recommended at Discharge Set up Supervision/Assistance  If plan is discharge home, recommend the following:  Can travel by private vehicle  A lot of help with walking and/or transfers;A little help with bathing/dressing/bathroom;Assistance  with cooking/housework;Help with stairs or ramp for entrance        Equipment Recommendations None recommended by PT  Recommendations for Other Services       Functional Status Assessment Patient has had a recent decline in their functional status and/or demonstrates limited ability to make significant improvements in function in a reasonable and predictable amount of time     Precautions / Restrictions Precautions Precautions: Fall Restrictions Weight Bearing Restrictions: No      Mobility  Bed Mobility Overal bed mobility: Modified Independent             General bed mobility comments: good return for long sitting in bed using BUE mostly    Transfers                        Ambulation/Gait                  Stairs            Wheelchair Mobility     Tilt Bed    Modified Rankin (Stroke Patients Only)       Balance Overall balance assessment: Needs assistance Sitting-balance support: Feet unsupported, Bilateral upper extremity supported Sitting balance-Leahy Scale: Fair Sitting balance - Comments: long sitting in bed                                     Pertinent Vitals/Pain Pain Assessment  Pain Assessment: Faces Faces Pain Scale: Hurts even more Pain Location: back and B LE with stretches Pain Descriptors / Indicators: Grimacing, Guarding Pain Intervention(s): Limited activity within patient's tolerance, Monitored during session, Repositioned    Home Living Family/patient expects to be discharged to:: Private residence Living Arrangements: Children Available Help at Discharge: Family;Available PRN/intermittently Type of Home: House Home Access: Ramped entrance       Home Layout: One level Home Equipment: Hospital bed;Wheelchair - manual;Other (comment) Additional Comments: Pt's daughter is also her home nurse per chart review.    Prior Function Prior Level of Function : Needs assist       Physical  Assist : Mobility (physical) Mobility (physical): Transfers   Mobility Comments: Hoyer lift at baseline for transfers, able to sit up in bed and self stretch BLE ADLs Comments: Indepdnent for bathing, dressing, grooming, and eating. Uses diaper for toileting.     Hand Dominance   Dominant Hand: Right    Extremity/Trunk Assessment   Upper Extremity Assessment Upper Extremity Assessment: Defer to OT evaluation    Lower Extremity Assessment Lower Extremity Assessment: Generalized weakness    Cervical / Trunk Assessment Cervical / Trunk Assessment: Normal  Communication   Communication: No difficulties  Cognition Arousal/Alertness: Awake/alert Behavior During Therapy: WFL for tasks assessed/performed Overall Cognitive Status: Within Functional Limits for tasks assessed                                          General Comments      Exercises     Assessment/Plan    PT Assessment Patient needs continued PT services  PT Problem List Decreased strength;Decreased range of motion;Decreased activity tolerance;Decreased balance;Decreased mobility       PT Treatment Interventions Functional mobility training;Therapeutic activities;Therapeutic exercise;Balance training;Patient/family education    PT Goals (Current goals can be found in the Care Plan section)  Acute Rehab PT Goals Patient Stated Goal: return home with family to assist PT Goal Formulation: With patient Time For Goal Achievement: 12/25/22 Potential to Achieve Goals: Good    Frequency Min 2X/week     Co-evaluation PT/OT/SLP Co-Evaluation/Treatment: Yes Reason for Co-Treatment: To address functional/ADL transfers PT goals addressed during session: Mobility/safety with mobility;Balance;Other (comment) (hamstring stretching) OT goals addressed during session: ADL's and self-care       AM-PAC PT "6 Clicks" Mobility  Outcome Measure Help needed turning from your back to your side while in a  flat bed without using bedrails?: A Little Help needed moving from lying on your back to sitting on the side of a flat bed without using bedrails?: A Little Help needed moving to and from a bed to a chair (including a wheelchair)?: Total Help needed standing up from a chair using your arms (e.g., wheelchair or bedside chair)?: Total Help needed to walk in hospital room?: Total Help needed climbing 3-5 steps with a railing? : Total 6 Click Score: 10    End of Session   Activity Tolerance: Patient tolerated treatment well;Patient limited by fatigue;Patient limited by pain Patient left: in bed;with call bell/phone within reach Nurse Communication: Mobility status PT Visit Diagnosis: Muscle weakness (generalized) (M62.81);Pain;Other abnormalities of gait and mobility (R26.89) Pain - Right/Left: Right Pain - part of body: Leg    Time: 4132-4401 PT Time Calculation (min) (ACUTE ONLY): 16 min   Charges:   PT Evaluation $PT Eval Low Complexity: 1 Low PT Treatments $  Therapeutic Activity: 8-22 mins PT General Charges $$ ACUTE PT VISIT: 1 Visit         12:27 PM, 12/18/22 Ocie Bob, MPT Physical Therapist with [facility] 336 [phone] office [phone] mobile phone

## 2022-12-19 DIAGNOSIS — I10 Essential (primary) hypertension: Secondary | ICD-10-CM | POA: Diagnosis not present

## 2022-12-19 DIAGNOSIS — N186 End stage renal disease: Secondary | ICD-10-CM | POA: Diagnosis not present

## 2022-12-19 DIAGNOSIS — A419 Sepsis, unspecified organism: Secondary | ICD-10-CM | POA: Diagnosis not present

## 2022-12-19 DIAGNOSIS — M869 Osteomyelitis, unspecified: Secondary | ICD-10-CM | POA: Diagnosis not present

## 2022-12-19 LAB — CBC
HCT: 25.8 % — ABNORMAL LOW (ref 36.0–46.0)
Hemoglobin: 7.7 g/dL — ABNORMAL LOW (ref 12.0–15.0)
MCH: 28.5 pg (ref 26.0–34.0)
MCHC: 29.8 g/dL — ABNORMAL LOW (ref 30.0–36.0)
MCV: 95.6 fL (ref 80.0–100.0)
Platelets: 205 10*3/uL (ref 150–400)
RBC: 2.7 MIL/uL — ABNORMAL LOW (ref 3.87–5.11)
RDW: 16.7 % — ABNORMAL HIGH (ref 11.5–15.5)
WBC: 5.9 10*3/uL (ref 4.0–10.5)
nRBC: 0.3 % — ABNORMAL HIGH (ref 0.0–0.2)

## 2022-12-19 LAB — BODY FLUID CULTURE W GRAM STAIN

## 2022-12-19 LAB — CULTURE, BLOOD (ROUTINE X 2): Culture: NO GROWTH

## 2022-12-19 MED ORDER — CALCITRIOL 0.25 MCG PO CAPS: 0.7500 ug | ORAL_CAPSULE | ORAL | Status: DC

## 2022-12-19 MED ORDER — CHLORHEXIDINE GLUCONATE CLOTH 2 % EX PADS
6.0000 | MEDICATED_PAD | Freq: Every day | CUTANEOUS | Status: DC
  Administered 2022-12-19: 6 via TOPICAL

## 2022-12-19 NOTE — Plan of Care (Signed)
  Problem: Education: Goal: Knowledge of General Education information will improve Description: Including pain rating scale, medication(s)/side effects and non-pharmacologic comfort measures Outcome: Progressing   Problem: Health Behavior/Discharge Planning: Goal: Ability to manage health-related needs will improve Outcome: Progressing   Problem: Clinical Measurements: Goal: Ability to maintain clinical measurements within normal limits will improve Outcome: Progressing Goal: Will remain free from infection Outcome: Progressing Goal: Diagnostic test results will improve Outcome: Progressing Goal: Respiratory complications will improve Outcome: Progressing Goal: Cardiovascular complication will be avoided Outcome: Progressing   Problem: Activity: Goal: Risk for activity intolerance will decrease Outcome: Progressing   Problem: Nutrition: Goal: Adequate nutrition will be maintained Outcome: Progressing   Problem: Coping: Goal: Level of anxiety will decrease Outcome: Progressing   Problem: Pain Managment: Goal: General experience of comfort will improve Outcome: Progressing   Problem: Safety: Goal: Ability to remain free from injury will improve Outcome: Progressing   Problem: Skin Integrity: Goal: Risk for impaired skin integrity will decrease Outcome: Progressing   Problem: Fluid Volume: Goal: Hemodynamic stability will improve Outcome: Progressing   Problem: Clinical Measurements: Goal: Diagnostic test results will improve Outcome: Progressing Goal: Signs and symptoms of infection will decrease Outcome: Progressing   Problem: Respiratory: Goal: Ability to maintain adequate ventilation will improve Outcome: Progressing
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
## 2022-12-19 NOTE — Progress Notes (Signed)
Admit: 12/13/2022 LOS: 6  89F ESRD MWF [facility] with sepsis and concern for infected decubitus ulcer  Subjective:  HD done yesterday-  unable to remove any fluid due to hypotension-  BP 150 systolic after  No c/o this AM BPs better On Vanc/rocephin now  No fevers chems not done this AM-  supposedly hgb down 2 grams ?   07/24 0701 - 07/25 0700 In: 326.7 [P.O.:120; IV Piggyback:206.7] Out: 450 [Urine:450]  Filed Weights   12/15/22 0442 12/18/22 1412 12/18/22 1750  Weight: 46.7 kg 49.2 kg 49.5 kg    Scheduled Meds:  amLODipine  10 mg Oral Daily   buprenorphine  1 patch Transdermal Weekly   carvedilol  6.25 mg Oral BID WC   Chlorhexidine Gluconate Cloth  6 each Topical Daily   cloNIDine  0.1 mg Oral BID   darbepoetin (ARANESP) injection - DIALYSIS  60 mcg Subcutaneous Q Wed-1800   heparin  5,000 Units Subcutaneous Q8H   irbesartan  150 mg Oral q1800   levETIRAcetam  1,000 mg Oral QPM   Continuous Infusions:  cefTRIAXone (ROCEPHIN)  IV Stopped (12/18/22 1909)   vancomycin 500 mg (12/18/22 2221)   PRN Meds:.acetaminophen **OR** acetaminophen, albuterol, alum & mag hydroxide-simeth, labetalol, methocarbamol, ondansetron **OR** ondansetron (ZOFRAN) IV, mouth rinse, oxyCODONE  Current Labs: reviewed   Physical Exam:  Blood pressure (!) 150/83, pulse 86, temperature 97.7 F (36.5 C), temperature source Oral, resp. rate 16, height 5\' 3"  (1.6 m), weight 49.5 kg, SpO2 95%. NAD lying in bed LUE AVF +B/T CTAB NO rub, regular  A ESRD MWF LUE AVF [facility] using LUE AVF Sepsis, resolving-  to continue IV abx here and at OP HD center Chronic decubitus ulcer concern for infection on Vanc/rocephin; BCx NGTD Anemia, Hb 9s, stable-  no ESA here as of yet-  have added -  possible acute drop today - per primary    CKDBMM: P and Ca stable-- no binder-  will add back calcitriol which she is on as OP HTN/Vol: BPs variable, trend.  On RA-  on many BP meds-  attempted UF but was  not successful -  low sodium indicates volume to give-  will try again tomorrow-  BP variable -  autonomic dysfunction ?  P  HD tomorrow on usual schedule Will follow along Medication Issues; Preferred narcotic agents for pain control are hydromorphone, fentanyl, and methadone. Morphine should not be used.  Baclofen should be avoided Avoid oral sodium phosphate and magnesium citrate based laxatives / bowel preps    [name]  12/19/2022, 7:59 AM  Recent Labs  Lab 12/14/22 0445 12/15/22 0435 12/16/22 0459 12/17/22 0317 12/18/22 1451  NA 135   < > 134* 133* 131*  K 3.8   < > 4.7 4.3 4.9  CL 104   < > 97* 97* 99  CO2 20*   < > 25 22 17*  GLUCOSE 118*   < > 93 90 122*  BUN 52*   < > 37* 43* 50*  CREATININE 6.12*   < > 4.04* 4.97* 6.66*  CALCIUM 8.0*   < > 8.2* 8.5* 8.4*  PHOS 5.2*  --   --   --  5.3*   < > = values in this interval not displayed.   Recent Labs  Lab 12/13/22 2100 12/14/22 0445 12/15/22 0435 12/17/22 0317 12/18/22 0324 12/19/22 0353  WBC 11.3* 9.6   < > 4.5 5.9 5.9  NEUTROABS 9.9* 8.2*  --   --   --   --  HGB 10.2* 9.0*   < > 9.3* 9.7* 7.7*  HCT 34.5* 29.9*   < > 30.8* 32.4* 25.8*  MCV 97.5 98.4   < > 95.4 94.7 95.6  PLT 169 147*   < > 170 191 205   < > = values in this interval not displayed.

## 2022-12-20 NOTE — Progress Notes (Signed)
Pt picked up for transport to [facility] by [employer]. Pt in stable condition. Report called to Maurine Minister, receiving RN at [facility].
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
## 2022-12-31 ENCOUNTER — Emergency Department (HOSPITAL_COMMUNITY): Payer: 59

## 2022-12-31 ENCOUNTER — Encounter (HOSPITAL_COMMUNITY): Payer: Self-pay | Admitting: *Deleted

## 2022-12-31 ENCOUNTER — Other Ambulatory Visit: Payer: Self-pay

## 2022-12-31 ENCOUNTER — Emergency Department (HOSPITAL_COMMUNITY)
Admission: EM | Admit: 2022-12-31 | Discharge: 2022-12-31 | Disposition: A | Discharge status: Home / Self Care | Payer: 59 | Attending: Student | Admitting: Student

## 2022-12-31 DIAGNOSIS — S7011XA Contusion of right thigh, initial encounter: Secondary | ICD-10-CM | POA: Diagnosis not present

## 2022-12-31 DIAGNOSIS — N186 End stage renal disease: Secondary | ICD-10-CM | POA: Insufficient documentation

## 2022-12-31 DIAGNOSIS — X58XXXA Exposure to other specified factors, initial encounter: Secondary | ICD-10-CM | POA: Diagnosis not present

## 2022-12-31 DIAGNOSIS — Z992 Dependence on renal dialysis: Secondary | ICD-10-CM | POA: Insufficient documentation

## 2022-12-31 DIAGNOSIS — S7001XA Contusion of right hip, initial encounter: Secondary | ICD-10-CM | POA: Diagnosis not present

## 2022-12-31 DIAGNOSIS — K683 Retroperitoneal hematoma: Secondary | ICD-10-CM

## 2022-12-31 DIAGNOSIS — S301XXA Contusion of abdominal wall, initial encounter: Secondary | ICD-10-CM | POA: Insufficient documentation

## 2022-12-31 DIAGNOSIS — S79911A Unspecified injury of right hip, initial encounter: Secondary | ICD-10-CM | POA: Diagnosis present

## 2022-12-31 DIAGNOSIS — T148XXA Other injury of unspecified body region, initial encounter: Secondary | ICD-10-CM

## 2022-12-31 LAB — CBC WITH DIFFERENTIAL/PLATELET
Abs Immature Granulocytes: 0.01 10*3/uL (ref 0.00–0.07)
Basophils Absolute: 0.1 10*3/uL (ref 0.0–0.1)
Basophils Relative: 2 %
Eosinophils Absolute: 0.2 10*3/uL (ref 0.0–0.5)
Eosinophils Relative: 6 %
HCT: 38.2 % (ref 36.0–46.0)
Hemoglobin: 11.4 g/dL — ABNORMAL LOW (ref 12.0–15.0)
Immature Granulocytes: 0 %
Lymphocytes Relative: 27 %
Lymphs Abs: 1.1 10*3/uL (ref 0.7–4.0)
MCH: 29.5 pg (ref 26.0–34.0)
MCHC: 29.8 g/dL — ABNORMAL LOW (ref 30.0–36.0)
MCV: 99 fL (ref 80.0–100.0)
Monocytes Absolute: 0.3 10*3/uL (ref 0.1–1.0)
Monocytes Relative: 8 %
Neutro Abs: 2.4 10*3/uL (ref 1.7–7.7)
Neutrophils Relative %: 57 %
Platelets: 213 10*3/uL (ref 150–400)
RBC: 3.86 MIL/uL — ABNORMAL LOW (ref 3.87–5.11)
RDW: 19.3 % — ABNORMAL HIGH (ref 11.5–15.5)
WBC: 4.1 10*3/uL (ref 4.0–10.5)
nRBC: 0 % (ref 0.0–0.2)

## 2022-12-31 LAB — COMPREHENSIVE METABOLIC PANEL
ALT: 14 U/L (ref 0–44)
AST: 21 U/L (ref 15–41)
Albumin: 3.2 g/dL — ABNORMAL LOW (ref 3.5–5.0)
Alkaline Phosphatase: 88 U/L (ref 38–126)
Anion gap: 12 (ref 5–15)
BUN: 32 mg/dL — ABNORMAL HIGH (ref 6–20)
CO2: 26 mmol/L (ref 22–32)
Calcium: 9 mg/dL (ref 8.9–10.3)
Chloride: 100 mmol/L (ref 98–111)
Creatinine, Ser: 3.43 mg/dL — ABNORMAL HIGH (ref 0.44–1.00)
GFR, Estimated: 15 mL/min — ABNORMAL LOW (ref >60)
Glucose, Bld: 123 mg/dL — ABNORMAL HIGH (ref 70–99)
Potassium: 3.2 mmol/L — ABNORMAL LOW (ref 3.5–5.1)
Sodium: 138 mmol/L (ref 135–145)
Total Bilirubin: 0.9 mg/dL (ref 0.3–1.2)
Total Protein: 7.6 g/dL (ref 6.5–8.1)

## 2022-12-31 LAB — TYPE AND SCREEN
ABO/RH(D): A POS
Antibody Screen: NEGATIVE

## 2022-12-31 LAB — PROTIME-INR
INR: 1 (ref 0.8–1.2)
Prothrombin Time: 13.5 seconds (ref 11.4–15.2)

## 2022-12-31 LAB — LACTIC ACID, PLASMA
Lactic Acid, Venous: 1.3 mmol/L (ref 0.5–1.9)
Lactic Acid, Venous: 0.9 mmol/L (ref 0.5–1.9)

## 2022-12-31 MED ORDER — IOHEXOL 350 MG/ML SOLN
100.0000 mL | Freq: Once | INTRAVENOUS | Status: AC | PRN
  Administered 2022-12-31: 100 mL via INTRAVENOUS

## 2022-12-31 NOTE — ED Triage Notes (Signed)
Pt was admitted to [facility] last week and given antibiotics due to 2 large swollen areas on her right hip  Daughter states the areas have gotten bigger since last week   Pt denies any pain and is paraplegic

## 2022-12-31 NOTE — Discharge Instructions (Addendum)
You were seen in the ER today for evaluation of your hematomas.  General surgery has seen you and does not recommend any intervention at this time.  I would feel like for you to keep careful watch over these.  If they start getting any overlying warmth, redness, or you develop any fever or any pain, or if they continue to get larger, please return to your nearest emergency department for reevaluation.  Otherwise, please make sure you are having close follow-up with your wound care and your primary care doctor as these will need to be monitored as well as your blood counts frequently.  If you have any concerns, new or worsening symptoms, please return to your nearest emergency department for evaluation.  Contact a doctor if: You have a fever. The swelling or bruising gets worse. You start to get more hematomas. Your pain gets worse. Your pain is not getting better with medicine. The skin over the hematoma breaks or starts to bleed. Get help right away if: Your hematoma is in your chest or belly and you: Pass out. Feel weak. Become short of breath. You have a hematoma on your scalp that is caused by a fall or injury, and you: Have a headache that gets worse. Have trouble speaking or understanding speech. Become less alert or you pass out. These symptoms may be an emergency. Get help right away. Call 911. Do not wait to see if the symptoms will go away. Do not drive yourself to the hospital

## 2022-12-31 NOTE — ED Notes (Signed)
Patient repositioned in bed and light dimmed for comfort. Patient denies any concerns or questions at this time.

## 2022-12-31 NOTE — ED Notes (Signed)
ED Provider at bedside. 

## 2022-12-31 NOTE — Consult Note (Signed)
Reason for Consult: Right groin, flank hematoma Referring Physician: Dr. Posey Rea  [name] is an 57 y.o. female.  HPI: Patient is a 57 year old female with multiple medical problems including paraplegia and ongoing pressure ulcers who was was recently discharged from [facility] due to sepsis.  Her discharge diagnosis included a stage IV sacral decubitus ulcer and a chronic left trochanteric osteomyelitis, end-stage renal disease, paraplegia, and retroperitoneal hematoma.  It was noted that she did have this retroperitoneal hematoma which required 2 units of packed red blood cells, but nothing further was done.  She has been followed by wound care center and she was noted to have a large fluid collection.  She was brought to the emergency room here as it seems to have enlarged in size.  Her hemoglobin is actually better than it was upon discharge.  Patient denies any fevers or drainage.  Past Medical History:  Diagnosis Date   Abnormal uterine bleeding (AUB) 06/15/2014   Cancer (HCC)    uterine   High blood pressure    Paraplegia (lower)    Seizure disorder (HCC)    Seizures (HCC)    Suprapubic catheter (HCC)    Urinary tract infection     Past Surgical History:  Procedure Laterality Date   APPLICATION OF WOUND VAC Right 09/13/2021   (approximately 1-42mos ago) pressure sore on right hip   BACK SURGERY     Pt stated "before 2000"   BIOPSY  12/03/2022   Procedure: BIOPSY;  Surgeon: Dolores Frame, MD;  [facility]: [facility];  Service: Gastroenterology;;   ESOPHAGOGASTRODUODENOSCOPY N/A 09/20/2015   Procedure: ESOPHAGOGASTRODUODENOSCOPY (EGD);  Surgeon: Malissa Hippo, MD;  Location: [facility];  Service: Endoscopy;  Laterality: N/A;  730   ESOPHAGOGASTRODUODENOSCOPY (EGD) WITH PROPOFOL N/A 12/03/2022   Procedure: ESOPHAGOGASTRODUODENOSCOPY (EGD) WITH PROPOFOL;  Surgeon: Dolores Frame, MD;  [facility];  Service: Gastroenterology;   Laterality: N/A;  1:15 pm, asa 3, pt knows to arrive at 10:30  dialysis pt, M,W & F   IR CATHETER TUBE CHANGE  04/02/2018   PERCUTANEOUS ENDOSCOPIC GASTROSTOMY (PEG) REMOVAL N/A 09/20/2015   Procedure: PERCUTANEOUS ENDOSCOPIC GASTROSTOMY (PEG) REMOVAL;  Surgeon: Malissa Hippo, MD;  [facility];  Service: Endoscopy;  Laterality: N/A;   TEE WITHOUT CARDIOVERSION N/A 11/11/2022   Procedure: TRANSESOPHAGEAL ECHOCARDIOGRAM (TEE);  Surgeon: Pricilla Riffle, MD;  Location: [facility];  Service: Cardiovascular;  Laterality: N/A;    Family History  Problem Relation Age of Onset   Cancer Mother    Hypertension Mother    Cancer Sister        breast and then spread everywhere.   Diabetes Paternal Grandmother    Hypertension Paternal Grandmother     Social History:  reports that she has never smoked. She has never used smokeless tobacco. She reports current alcohol use. She reports that she does not use drugs.  Allergies:  Allergies  Allergen Reactions   Benadryl [Diphenhydramine Hcl (Sleep)] Hives   Daptomycin Hives   Linezolid     Other Reaction(s): GI Intolerance  Patient self-discontinued treatment due to GI intolerance. Taking it along with moxifloxacin   Moxifloxacin     Other Reaction(s): GI Intolerance  Patient self-discontinued treatment due to GI intolerance. Taking it along with linezolid   Quinine Derivatives Other (See Comments)    Alters mental status   Vancomycin     Pt is tolerating this medication at HD   Azithromycin Itching and Rash  Tetracycline Itching    Able to tolerate Doxycycline.    Zosyn [Piperacillin Sod-Tazobactam So] Rash    Medications: I have reviewed the patient's current medications.  Results for orders placed or performed during the hospital encounter of 12/31/22 (from the past 48 hour(s))  CBC with Differential     Status: Abnormal   Collection Time: 12/31/22 10:01 AM  Result Value Ref Range   WBC 4.1 4.0 - 10.5 K/uL   RBC 3.86 (L)  3.87 - 5.11 MIL/uL   Hemoglobin 11.4 (L) 12.0 - 15.0 g/dL   HCT 30.8 65.7 - 84.6 %   MCV 99.0 80.0 - 100.0 fL   MCH 29.5 26.0 - 34.0 pg   MCHC 29.8 (L) 30.0 - 36.0 g/dL   RDW 96.2 (H) 95.2 - 84.1 %   Platelets 213 150 - 400 K/uL   nRBC 0.0 0.0 - 0.2 %   Neutrophils Relative % 57 %   Neutro Abs 2.4 1.7 - 7.7 K/uL   Lymphocytes Relative 27 %   Lymphs Abs 1.1 0.7 - 4.0 K/uL   Monocytes Relative 8 %   Monocytes Absolute 0.3 0.1 - 1.0 K/uL   Eosinophils Relative 6 %   Eosinophils Absolute 0.2 0.0 - 0.5 K/uL   Basophils Relative 2 %   Basophils Absolute 0.1 0.0 - 0.1 K/uL   Immature Granulocytes 0 %   Abs Immature Granulocytes 0.01 0.00 - 0.07 K/uL    Comment: Performed at [facility], [address] [place], [place] [address]  Type and screen [facility]     Status: None   Collection Time: 12/31/22 10:01 AM  Result Value Ref Range   ABO/RH(D) A POS    Antibody Screen NEG    Sample Expiration      01/03/2023,2359 Performed at [facility], [address], [place], [place] [address]   Comprehensive metabolic panel     Status: Abnormal   Collection Time: 12/31/22 10:01 AM  Result Value Ref Range   Sodium 138 135 - 145 mmol/L   Potassium 3.2 (L) 3.5 - 5.1 mmol/L   Chloride 100 98 - 111 mmol/L   CO2 26 22 - 32 mmol/L   Glucose, Bld 123 (H) 70 - 99 mg/dL    Comment: Glucose reference range applies only to samples taken after fasting for at least 8 hours.   BUN 32 (H) 6 - 20 mg/dL   Creatinine, Ser 5.36 (H) 0.44 - 1.00 mg/dL   Calcium 9.0 8.9 - 64.4 mg/dL   Total Protein 7.6 6.5 - 8.1 g/dL   Albumin 3.2 (L) 3.5 - 5.0 g/dL   AST 21 15 - 41 U/L   ALT 14 0 - 44 U/L   Alkaline Phosphatase 88 38 - 126 U/L   Total Bilirubin 0.9 0.3 - 1.2 mg/dL   GFR, Estimated 15 (L) >60 mL/min    Comment: (NOTE) Calculated using the CKD-EPI Creatinine Equation (2021)    Anion gap 12 5 - 15    Comment: Performed at [facility], [address] [place], [place] [address]  Lactic acid,  plasma     Status: None   Collection Time: 12/31/22 10:01 AM  Result Value Ref Range   Lactic Acid, Venous 1.3 0.5 - 1.9 mmol/L    Comment: Performed at [facility], [address], [place], [place] [address]  Protime-INR     Status: None   Collection Time: 12/31/22 10:01 AM  Result Value Ref Range   Prothrombin Time 13.5 11.4 -  15.2 seconds   INR 1.0 0.8 - 1.2    Comment: (NOTE) INR goal varies based on device and disease states. Performed at [facility], [address], [place], [place] [address]   Lactic acid, plasma     Status: None   Collection Time: 12/31/22 11:37 AM  Result Value Ref Range   Lactic Acid, Venous 0.9 0.5 - 1.9 mmol/L    Comment: Performed at [facility], [address], [place], [place] [address]    CT ANGIO GI BLEED  Result Date: 12/31/2022 CLINICAL DATA:  Retroperitoneal and right thigh hematoma. Recent drainage (right hip aspiration 12/18/2022). CTA GI bleeding study requested. EXAM: CTA ABDOMEN AND PELVIS WITHOUT AND WITH CONTRAST TECHNIQUE: Multidetector CT imaging of the abdomen and pelvis was performed using the standard protocol during bolus administration of intravenous contrast. Multiplanar reconstructed images and MIPs were obtained and reviewed to evaluate the vascular anatomy. RADIATION DOSE REDUCTION: This exam was performed according to the departmental dose-optimization program which includes automated exposure control, adjustment of the mA and/or kV according to patient size and/or use of iterative reconstruction technique. CONTRAST:  OMNIPAQUE IOHEXOL 350 MG/ML SOLN COMPARISON:  Noncontrast CT of the abdomen and pelvis 12/13/2022. Abdominopelvic CT with contrast 11/10/2022. FINDINGS: VASCULAR Aorta: Aortic atherosclerosis without evidence of aneurysm, dissection or luminal stenosis. Celiac: Unremarkable. SMA: Unremarkable. Renals: Ostial atherosclerosis with long segment stenosis of the right renal artery. No evidence of aneurysm or dissection. IMA:  Patent. Inflow: Mild atherosclerosis without evidence of aneurysm or significant stenosis. Proximal Outflow: Bilateral common femoral and visualized portions of the superficial and profunda femoral arteries are patent without evidence of aneurysm, dissection, vasculitis or significant stenosis. No active bleeding identified. Veins: No significant venous abnormalities are identified. The portal, superior mesenteric and splenic veins are patent. The IVC and iliac veins are patent. Review of the MIP images confirms the above findings. NON-VASCULAR Lower chest: Right pleural effusion has resolved. There is improved aeration of both lung bases with mild residual atelectasis or scarring. The heart remains enlarged, with a stable moderate-sized pericardial effusion. Hepatobiliary: No suspicious focal liver lesions are identified. Low-density lesion posteriorly in the right hepatic lobe is unchanged. Calcified gallstones are noted. No evidence of gallbladder wall thickening, surrounding inflammation or significant biliary dilatation. Pancreas: No evidence of pancreatic ductal dilatation, mass lesion or surrounding inflammation. Spleen: The spleen appears stable, at the upper limits of normal in size. No focal abnormality. Adrenals/Urinary Tract: Both adrenal glands appear normal. Severe chronic renal cortical thinning and atrophy bilaterally. No evidence of urinary tract calculus, renal mass or hydronephrosis. There are stable small renal cysts bilaterally for which no specific follow-up imaging is recommended. Suprapubic bladder catheter is in place with unchanged nonspecific generalized bladder wall thickening. Stomach/Bowel: No enteric contrast administered. The stomach appears unremarkable for its degree of distention. No evidence of small bowel distension, wall thickening or surrounding inflammation. The appendix appears normal. The proximal colon appears unremarkable. Nonspecific rectal wall thickening. No evidence of  active GI bleeding. The Lymphatic: No enlarged abdominopelvic lymph nodes identified. Reproductive: The uterus and ovaries appear unremarkable. No evidence of adnexal mass. Other: Interval development of a large subcutaneous fluid collection within the right anterior abdominal wall which appears contiguous with a similar appearing collection anteriorly in the proximal right thigh. The component draping over the anterior aspect of the right iliac crest measures up to 13.3 x 6.0 cm on image 35/4 and extends up to 20.2 cm in overall length on sagittal image 35/8. A  component in the proximal thigh measures up to 9.4 x 5.1 cm on image 60/4. No active bleeding into these collections identified. There is mild nonspecific peripheral enhancement on the delayed images. These collections have developed over the last 2 weeks and are consistent with subacute hematomas. Musculoskeletal: Chronic deformity of the right hip with chronic posterolateral subluxation, right hip degenerative changes and a nonspecific right hip joint effusion. There is chronic decubitus ulceration over both ischial tuberosities and the left femoral greater trochanter. Chronic erosive changes of both ischial tuberosities, the coccyx and left greater trochanter are similar to previous study. The no progressive bone destruction to suggest osteomyelitis. Previous lower thoracic fusion with stable degenerative disc disease at T11-12. Peripherally calcified subcutaneous collection along the inferior right aspect of the coccyx appears unchanged, measuring approximately 6.8 x 2.4 cm on image 56/15 IMPRESSION: 1. Interval development of a large subcutaneous fluid collection within the right anterior abdominal wall and proximal right thigh, consistent with subacute hematomas. No active bleeding identified. 2. No evidence of active GI bleeding. 3. No acute vascular findings. 4. Chronic rectal wall thickening which may relate to prolapse, similar to previous study.  5. Chronic decubitus ulcerations over both ischial tuberosities and the left femoral greater trochanter with chronic erosive changes of both ischial tuberosities, the coccyx and the left greater trochanter. No progressive bone destruction to suggest ongoing osteomyelitis. Stable chronic deformity of the right hip with chronic posterolateral subluxation, degenerative changes and a nonspecific right hip joint effusion. 6. Stable cardiomegaly with moderate-sized pericardial effusion. 7.  Aortic Atherosclerosis (ICD10-I70.0). Electronically Signed   By: Carey Bullocks M.D.   On: 12/31/2022 12:08    ROS:  Pertinent items are noted in HPI.  Blood pressure (!) 179/79, pulse 68, temperature (!) 97.5 F (36.4 C), resp. rate 18, height 5\' 3"  (1.6 m), weight 45.3 kg, SpO2 95%. Physical Exam: Pleasant female no acute distress Head is normocephalic, atraumatic Right groin and hip with fluid-filled subcutaneous tissue with surrounding ecchymosis extending up the right flank into the back.  No open wounds are noted.  Please see pictures on chart  Assessment/Plan: Impression: Subcutaneous hematoma that appears to be resolving, even though it may be slightly bigger.  This may be due to oncotic pressure from blood drawing in fluid.  This does not appear to be infected. Plan: Would just monitor this at the present time as there is no indication to tap this.  This was explained to the patient and family.  Follow-up with wound care and primary care as needed.  Franky Macho 12/31/2022, 2:04 PM

## 2022-12-31 NOTE — ED Notes (Signed)
Lab at bedside for second set of blood cultures.

## 2022-12-31 NOTE — ED Provider Notes (Signed)
EMERGENCY DEPARTMENT AT [facility] Provider Note   CSN: [id number] Arrival date & time: 12/31/22  8657     History Chief Complaint  Patient presents with   Abscess    [name] is a 57 y.o. female with h/o paraplegia with suprapubic catheter in place, seizure disorder, sacral decubitus ulcer, ESRD on hemodialysis presents to the ER for evaluation of abdominal/leg swelling. The patient was recently discharged last week from [facility]. She reports that she did have swelling on her thigh at that point but told her this would improve. Now she is having the swelling in her lower right abdomen and flank. She denies any new trauma to the area. She reports that both have been worsening over the past week since discharge. Denies any new trauma or falls. She doesn't know why she was them. Denies any fevers or other symptoms. She does not having feeling in her abdomen, so she does not know if they are painful. She reports that she was seen by her wound care provider just recently for her chronic wounds and was told her wounds were looking much better. Additionally, per the patient, she was instructed to follow up with her PCP for monitoring of these. She denies any fevers. Went to dialysis yesterday for full session where she received her last dose of antibiotics. Her daughter wanted her to come in and be evaluated.    Abscess Associated symptoms: no fever, no nausea and no vomiting        Home Medications Prior to Admission medications   Medication Sig Start Date End Date Taking? Authorizing Provider  acetaminophen (TYLENOL) 500 MG tablet Take 1,000 mg by mouth every 6 (six) hours as needed for moderate pain.    [provider]  albuterol (VENTOLIN HFA) 108 (90 Base) MCG/ACT inhaler Inhale 2 puffs into the lungs every 4 (four) hours as needed for wheezing or shortness of breath. 11/12/22   Shon Hale, MD  amLODipine (NORVASC) 5 MG tablet Take 1 tablet (5  mg total) by mouth daily. Take 1 tablet on Monday,Wednesday and Friday then take 2 tablets on Tuesday,Thursday,Saturday and Sunday. Patient taking differently: Take 5 mg by mouth See admin instructions. Take 1 tablet on Monday,Wednesday and Friday then take 2 tablets on Tuesday,Thursday,Saturday and Sunday. 11/12/22   Shon Hale, MD  B Complex-C-Folic Acid (RENA-VITE RX PO) Take 1 tablet by mouth every morning.    [provider]  buprenorphine (BUTRANS) 7.5 MCG/HR Place 1 patch onto the skin once a week. 12/17/22   Shahmehdi, Gemma Payor, MD  carvedilol (COREG) 6.25 MG tablet Take 6.25 mg by mouth 2 (two) times daily with a meal.    [provider]  cefTRIAXone 2 g in sodium chloride 0.9 % 100 mL Inject 2 g into the vein daily. 12/18/22   Shahmehdi, Gemma Payor, MD  cloNIDine (CATAPRES) 0.1 MG tablet Take 1 tablet (0.1 mg total) by mouth 2 (two) times daily. 11/12/22   Shon Hale, MD  furosemide (LASIX) 20 MG tablet Take 20 mg by mouth daily. 08/14/21   [provider]  irbesartan (AVAPRO) 150 MG tablet Take 150 mg by mouth daily at 6 PM.    [provider]  levETIRAcetam (KEPPRA) 500 MG tablet Take 1,000 mg by mouth every evening. Take an additional tablet on Monday,Wednesday and Friday evening after Dialysis 08/09/21   [provider]  lidocaine-prilocaine (EMLA) cream Apply 1 Application topically 3 (three) times a week. 06/04/22   [provider]  naloxone (NARCAN) nasal spray 4 mg/0.1 mL Place 0.4 mg into the nose once. 10/03/22   [provider]  pantoprazole (PROTONIX) 40 MG tablet Take 40 mg by mouth daily. 08/03/22   [provider]  senna-docusate (SENOKOT-S) 8.6-50 MG tablet Take 2 tablets by mouth at bedtime. 11/12/22   Shon Hale, MD  sevelamer carbonate (RENVELA) 800 MG tablet Take 800 mg by mouth 3 (three) times daily. 12/11/22   [provider]  vancomycin (VANCOREADY) 500 MG/100ML IVPB Inject 100 mLs (500 mg  total) into the vein every Monday, Wednesday, and Friday with hemodialysis. 12/18/22   Kendell Bane, MD      Allergies    Benadryl [diphenhydramine hcl (sleep)], Daptomycin, Linezolid, Moxifloxacin, Quinine derivatives, Vancomycin, Azithromycin, Tetracycline, and Zosyn [piperacillin sod-tazobactam so]    Review of Systems   Review of Systems  Constitutional:  Negative for chills and fever.  Respiratory:  Negative for shortness of breath.   Cardiovascular:  Negative for chest pain.  Gastrointestinal:  Negative for nausea and vomiting.  Skin:        Skin swelling    Physical Exam Updated Vital Signs BP (!) 170/60 (BP Location: Right Arm)   Pulse 70   Temp 97.8 F (36.6 C) (Oral)   Resp 18   Ht 5\' 3"  (1.6 m)   Wt 45.3 kg   LMP  (LMP Unknown)   SpO2 98%   BMI 17.69 kg/m  Physical Exam Vitals and nursing note reviewed.  Constitutional:      General: She is not in acute distress.    Appearance: She is not ill-appearing or toxic-appearing.  HENT:     Head:     Comments: Slight left sided facial droop which is chronic for the patient Eyes:     General: No scleral icterus. Cardiovascular:     Rate and Rhythm: Normal rate.  Pulmonary:     Effort: Pulmonary effort is normal. No respiratory distress.  Abdominal:     Palpations: Abdomen is soft.     Tenderness: There is no guarding or rebound.     Comments: Large bruising seen to the patient's right flank. There is some distinct RLQ abdominal swelling that does appear to go into the flank and back. There is overlying bruising noted to the area. Firm, but still pliable. Non pulsatile. She has a similar distinct swelling/fluid pocket on the upper right thigh with overlying bruising noted. Again, firm but pliable. Non pulsatile. Dopplerable DP and PT pulses bilaterally. Compartments are soft.   Musculoskeletal:     Comments: Legs chronically held in contractures.   Skin:    Comments: Wounds to her left lateral posterior thigh  and right buttock appear to be well healing from previous images. No purulent discharge noted. No surrounding warmth or erythema.  Neurological:     Mental Status: She is alert and oriented to person, place, and time. Mental status is at baseline.     Comments: Paraplegic.             ED Results / Procedures / Treatments   Labs (all labs ordered are listed, but only abnormal results are displayed) Labs Reviewed  CBC WITH DIFFERENTIAL/PLATELET - Abnormal; Notable for the following components:      Result Value   RBC 3.86 (*)    Hemoglobin 11.4 (*)    MCHC 29.8 (*)    RDW 19.3 (*)    All other components within normal limits  COMPREHENSIVE METABOLIC PANEL -  Abnormal; Notable for the following components:   Potassium 3.2 (*)    Glucose, Bld 123 (*)    BUN 32 (*)    Creatinine, Ser 3.43 (*)    Albumin 3.2 (*)    GFR, Estimated 15 (*)    All other components within normal limits  CULTURE, BLOOD (ROUTINE X 2)  CULTURE, BLOOD (ROUTINE X 2)  LACTIC ACID, PLASMA  LACTIC ACID, PLASMA  PROTIME-INR  TYPE AND SCREEN    EKG None  Radiology CT ANGIO GI BLEED  Result Date: 12/31/2022 CLINICAL DATA:  Retroperitoneal and right thigh hematoma. Recent drainage (right hip aspiration 12/18/2022). CTA GI bleeding study requested. EXAM: CTA ABDOMEN AND PELVIS WITHOUT AND WITH CONTRAST TECHNIQUE: Multidetector CT imaging of the abdomen and pelvis was performed using the standard protocol during bolus administration of intravenous contrast. Multiplanar reconstructed images and MIPs were obtained and reviewed to evaluate the vascular anatomy. RADIATION DOSE REDUCTION: This exam was performed according to the departmental dose-optimization program which includes automated exposure control, adjustment of the mA and/or kV according to patient size and/or use of iterative reconstruction technique. CONTRAST:  OMNIPAQUE IOHEXOL 350 MG/ML SOLN COMPARISON:  Noncontrast CT of the abdomen and pelvis  12/13/2022. Abdominopelvic CT with contrast 11/10/2022. FINDINGS: VASCULAR Aorta: Aortic atherosclerosis without evidence of aneurysm, dissection or luminal stenosis. Celiac: Unremarkable. SMA: Unremarkable. Renals: Ostial atherosclerosis with long segment stenosis of the right renal artery. No evidence of aneurysm or dissection. IMA: Patent. Inflow: Mild atherosclerosis without evidence of aneurysm or significant stenosis. Proximal Outflow: Bilateral common femoral and visualized portions of the superficial and profunda femoral arteries are patent without evidence of aneurysm, dissection, vasculitis or significant stenosis. No active bleeding identified. Veins: No significant venous abnormalities are identified. The portal, superior mesenteric and splenic veins are patent. The IVC and iliac veins are patent. Review of the MIP images confirms the above findings. NON-VASCULAR Lower chest: Right pleural effusion has resolved. There is improved aeration of both lung bases with mild residual atelectasis or scarring. The heart remains enlarged, with a stable moderate-sized pericardial effusion. Hepatobiliary: No suspicious focal liver lesions are identified. Low-density lesion posteriorly in the right hepatic lobe is unchanged. Calcified gallstones are noted. No evidence of gallbladder wall thickening, surrounding inflammation or significant biliary dilatation. Pancreas: No evidence of pancreatic ductal dilatation, mass lesion or surrounding inflammation. Spleen: The spleen appears stable, at the upper limits of normal in size. No focal abnormality. Adrenals/Urinary Tract: Both adrenal glands appear normal. Severe chronic renal cortical thinning and atrophy bilaterally. No evidence of urinary tract calculus, renal mass or hydronephrosis. There are stable small renal cysts bilaterally for which no specific follow-up imaging is recommended. Suprapubic bladder catheter is in place with unchanged nonspecific generalized  bladder wall thickening. Stomach/Bowel: No enteric contrast administered. The stomach appears unremarkable for its degree of distention. No evidence of small bowel distension, wall thickening or surrounding inflammation. The appendix appears normal. The proximal colon appears unremarkable. Nonspecific rectal wall thickening. No evidence of active GI bleeding. The Lymphatic: No enlarged abdominopelvic lymph nodes identified. Reproductive: The uterus and ovaries appear unremarkable. No evidence of adnexal mass. Other: Interval development of a large subcutaneous fluid collection within the right anterior abdominal wall which appears contiguous with a similar appearing collection anteriorly in the proximal right thigh. The component draping over the anterior aspect of the right iliac crest measures up to 13.3 x 6.0 cm on image 35/4 and extends up to 20.2 cm in overall length on sagittal image 35/8.  A component in the proximal thigh measures up to 9.4 x 5.1 cm on image 60/4. No active bleeding into these collections identified. There is mild nonspecific peripheral enhancement on the delayed images. These collections have developed over the last 2 weeks and are consistent with subacute hematomas. Musculoskeletal: Chronic deformity of the right hip with chronic posterolateral subluxation, right hip degenerative changes and a nonspecific right hip joint effusion. There is chronic decubitus ulceration over both ischial tuberosities and the left femoral greater trochanter. Chronic erosive changes of both ischial tuberosities, the coccyx and left greater trochanter are similar to previous study. The no progressive bone destruction to suggest osteomyelitis. Previous lower thoracic fusion with stable degenerative disc disease at T11-12. Peripherally calcified subcutaneous collection along the inferior right aspect of the coccyx appears unchanged, measuring approximately 6.8 x 2.4 cm on image 56/15 IMPRESSION: 1. Interval  development of a large subcutaneous fluid collection within the right anterior abdominal wall and proximal right thigh, consistent with subacute hematomas. No active bleeding identified. 2. No evidence of active GI bleeding. 3. No acute vascular findings. 4. Chronic rectal wall thickening which may relate to prolapse, similar to previous study. 5. Chronic decubitus ulcerations over both ischial tuberosities and the left femoral greater trochanter with chronic erosive changes of both ischial tuberosities, the coccyx and the left greater trochanter. No progressive bone destruction to suggest ongoing osteomyelitis. Stable chronic deformity of the right hip with chronic posterolateral subluxation, degenerative changes and a nonspecific right hip joint effusion. 6. Stable cardiomegaly with moderate-sized pericardial effusion. 7.  Aortic Atherosclerosis (ICD10-I70.0). Electronically Signed   By: Carey Bullocks M.D.   On: 12/31/2022 12:08   Procedures Procedures    Medications Ordered in ED Medications  iohexol (OMNIPAQUE) 350 MG/ML injection 100 mL (100 mLs Intravenous Contrast Given 12/31/22 1110)    ED Course/ Medical Decision Making/ A&P Clinical Course as of 01/03/23 1407  Tue Dec 31, 2022  1006 CT GI bleed, need three phase for concern for active bleed from retro peritoneal.  [RR]    Clinical Course User Index [RR] Achille Rich, PA-C                               Medical Decision Making Amount and/or Complexity of Data Reviewed Labs: ordered. Radiology: ordered.  Risk Prescription drug management.   57 y.o. female presents to the ER for evaluation of hematomas. Differential diagnosis includes but is not limited to sepsis, active extravasation, retroperitoneal hematoma acute blood loss. Vital signs pressure otherwise unremarkable. Physical exam as noted above.   From chart evaluation, the patient was admitted to the hospital at [facility] on 12/13/22 and was went to [facility] on  12/20/22 for tertiary care. They were questioning sepsis from UTI, bacteremia, osteomyelitis, or possible septic arthritis.  There was an MRI that showed a questionable fluid collection of the sacrum.  She was sent over to [facility] where she was noted to have a retroperitoneal hematoma measuring 16.6 cm the downtrending hemoglobin.  Required blood transfusion.  She was discharged home on antibiotics to continue with hemodialysis.  I independently reviewed and interpreted the patient's labs. CBC 0.4 which is within patient's baseline.  No leukocytosis.  Lactic acid within normal limits.  PT/INR normal.  CMP did show mildly decreased potassium 3.2 however patient is a dialysis patient.  Glucose mildly elevated 123.  Creatinine at 3.43 the BUN of 32.  Mildly decreased albumin  at 3.2.  Blood cultures pending.. Last hemoglobin was 9.7 on 12/24/22.  CT imaging shows 1. Interval development of a large subcutaneous fluid collection within the right anterior abdominal wall and proximal right thigh, consistent with subacute hematomas. No active bleeding identified. 2. No evidence of active GI bleeding. 3. No acute vascular findings. 4. Chronic rectal wall thickening which may relate to prolapse, similar to previous study. 5. Chronic decubitus ulcerations over both ischial tuberosities and the left femoral greater trochanter with chronic erosive changes of both ischial tuberosities, the coccyx and the left greater trochanter. No progressive bone destruction to suggest ongoing osteomyelitis. Stable chronic deformity of the right hip with chronic posterolateral subluxation, degenerative changes and a nonspecific right hip joint effusion. 6. Stable cardiomegaly with moderate-sized pericardial effusion. 7.  Aortic Atherosclerosis   I consulted general surgery and spoke with Dr. Lovell Sheehan. He came and evaluated the patient at bedside. Questioning if these hematomas where caused from the patient's hip aspiration done to  r/o septic arthritis. He does not recommends drainage at this time. Does not recommend any antibiotics. He would like to the patient to to follow up with PCP for monitoring and return to the ER for any new or worsening symptoms. This was discussed with the patient and daughter over the phone.   With general surgery reporting that she is stable for discharge home as well as no active devastation seen on CT imaging and improving hemoglobin, patient stable for discharge home with close outpatient follow-up.  We discussed the results of the labs/imaging. The plan is follow up with your PCP for continuous monitoring of this. We discussed strict return precautions and red flag symptoms. The patient verbalized their understanding and agrees to the plan. The patient is stable and being discharged home in good condition.  Portions of this report may have been transcribed using voice recognition software. Every effort was made to ensure accuracy; however, inadvertent computerized transcription errors may be present.   I discussed this case with my attending physician who cosigned this note including patient's presenting symptoms, physical exam, and planned diagnostics and interventions. Attending physician stated agreement with plan or made changes to plan which were implemented.   Final Clinical Impression(s) / ED Diagnoses Final diagnoses:  Hematoma of right hip, initial encounter  Hematoma of right thigh, initial encounter    Rx / DC Orders ED Discharge Orders     None         Achille Rich, PA-C 01/03/23 1429    Glendora Score, MD 01/03/23 [phone]

## 2022-12-31 NOTE — ED Notes (Signed)
Patient updated on plan of care. Daughter at bedside. Patient denies any concerns or questions at this time.

## 2022-12-31 NOTE — ED Notes (Signed)
Unsuccessful blood collection attempt for second set of blood cultures by this RN. Christy, RN to bedside shortly to attempt.

## 2023-03-05 ENCOUNTER — Encounter (HOSPITAL_COMMUNITY): Payer: Self-pay

## 2023-03-05 ENCOUNTER — Emergency Department (HOSPITAL_COMMUNITY): Payer: 59

## 2023-03-05 ENCOUNTER — Other Ambulatory Visit: Payer: Self-pay

## 2023-03-05 ENCOUNTER — Observation Stay (HOSPITAL_COMMUNITY)
Admission: EM | Admit: 2023-03-05 | Discharge: 2023-03-06 | Disposition: A | Discharge status: Home / Self Care | Payer: 59 | Attending: Family Medicine | Admitting: Family Medicine

## 2023-03-05 DIAGNOSIS — J9811 Atelectasis: Secondary | ICD-10-CM | POA: Diagnosis not present

## 2023-03-05 DIAGNOSIS — D649 Anemia, unspecified: Secondary | ICD-10-CM | POA: Diagnosis present

## 2023-03-05 DIAGNOSIS — L89323 Pressure ulcer of left buttock, stage 3: Secondary | ICD-10-CM

## 2023-03-05 DIAGNOSIS — D631 Anemia in chronic kidney disease: Secondary | ICD-10-CM | POA: Insufficient documentation

## 2023-03-05 DIAGNOSIS — R0609 Other forms of dyspnea: Secondary | ICD-10-CM

## 2023-03-05 DIAGNOSIS — J90 Pleural effusion, not elsewhere classified: Secondary | ICD-10-CM | POA: Insufficient documentation

## 2023-03-05 DIAGNOSIS — L89309 Pressure ulcer of unspecified buttock, unspecified stage: Secondary | ICD-10-CM | POA: Diagnosis present

## 2023-03-05 DIAGNOSIS — R0602 Shortness of breath: Secondary | ICD-10-CM | POA: Diagnosis present

## 2023-03-05 DIAGNOSIS — I120 Hypertensive chronic kidney disease with stage 5 chronic kidney disease or end stage renal disease: Secondary | ICD-10-CM | POA: Diagnosis not present

## 2023-03-05 DIAGNOSIS — Z992 Dependence on renal dialysis: Secondary | ICD-10-CM | POA: Diagnosis present

## 2023-03-05 DIAGNOSIS — J9601 Acute respiratory failure with hypoxia: Principal | ICD-10-CM

## 2023-03-05 DIAGNOSIS — I509 Heart failure, unspecified: Secondary | ICD-10-CM

## 2023-03-05 DIAGNOSIS — R0600 Dyspnea, unspecified: Secondary | ICD-10-CM | POA: Diagnosis present

## 2023-03-05 DIAGNOSIS — N186 End stage renal disease: Secondary | ICD-10-CM | POA: Insufficient documentation

## 2023-03-05 DIAGNOSIS — Z8542 Personal history of malignant neoplasm of other parts of uterus: Secondary | ICD-10-CM | POA: Diagnosis not present

## 2023-03-05 DIAGNOSIS — I10 Essential (primary) hypertension: Secondary | ICD-10-CM | POA: Diagnosis present

## 2023-03-05 DIAGNOSIS — Z79899 Other long term (current) drug therapy: Secondary | ICD-10-CM | POA: Diagnosis not present

## 2023-03-05 DIAGNOSIS — K219 Gastro-esophageal reflux disease without esophagitis: Secondary | ICD-10-CM | POA: Diagnosis present

## 2023-03-05 LAB — CBC WITH DIFFERENTIAL/PLATELET
Abs Immature Granulocytes: 0.06 10*3/uL (ref 0.00–0.07)
Basophils Absolute: 0.1 10*3/uL (ref 0.0–0.1)
Basophils Relative: 1 %
Eosinophils Absolute: 0.2 10*3/uL (ref 0.0–0.5)
Eosinophils Relative: 3 %
HCT: 28.5 % — ABNORMAL LOW (ref 36.0–46.0)
Hemoglobin: 8.2 g/dL — ABNORMAL LOW (ref 12.0–15.0)
Immature Granulocytes: 1 %
Lymphocytes Relative: 21 %
Lymphs Abs: 1.5 10*3/uL (ref 0.7–4.0)
MCH: 25.9 pg — ABNORMAL LOW (ref 26.0–34.0)
MCHC: 28.8 g/dL — ABNORMAL LOW (ref 30.0–36.0)
MCV: 90.2 fL (ref 80.0–100.0)
Monocytes Absolute: 0.5 10*3/uL (ref 0.1–1.0)
Monocytes Relative: 7 %
Neutro Abs: 4.7 10*3/uL (ref 1.7–7.7)
Neutrophils Relative %: 67 %
Platelets: 283 10*3/uL (ref 150–400)
RBC: 3.16 MIL/uL — ABNORMAL LOW (ref 3.87–5.11)
RDW: 18.4 % — ABNORMAL HIGH (ref 11.5–15.5)
WBC: 7 10*3/uL (ref 4.0–10.5)
nRBC: 0 % (ref 0.0–0.2)

## 2023-03-05 LAB — BLOOD GAS, VENOUS
Acid-Base Excess: 7.1 mmol/L — ABNORMAL HIGH (ref 0.0–2.0)
Bicarbonate: 31.3 mmol/L — ABNORMAL HIGH (ref 20.0–28.0)
Drawn by: 35430
O2 Saturation: 87.3 %
Patient temperature: 36.7
pCO2, Ven: 41 mm[Hg] — ABNORMAL LOW (ref 44–60)
pH, Ven: 7.49 — ABNORMAL HIGH (ref 7.25–7.43)
pO2, Ven: 53 mm[Hg] — ABNORMAL HIGH (ref 32–45)

## 2023-03-05 LAB — COMPREHENSIVE METABOLIC PANEL
ALT: 9 U/L (ref 0–44)
AST: 15 U/L (ref 15–41)
Albumin: 2.2 g/dL — ABNORMAL LOW (ref 3.5–5.0)
Alkaline Phosphatase: 77 U/L (ref 38–126)
Anion gap: 7 (ref 5–15)
BUN: 15 mg/dL (ref 6–20)
CO2: 27 mmol/L (ref 22–32)
Calcium: 7.6 mg/dL — ABNORMAL LOW (ref 8.9–10.3)
Chloride: 97 mmol/L — ABNORMAL LOW (ref 98–111)
Creatinine, Ser: 1.87 mg/dL — ABNORMAL HIGH (ref 0.44–1.00)
GFR, Estimated: 31 mL/min — ABNORMAL LOW (ref >60)
Glucose, Bld: 101 mg/dL — ABNORMAL HIGH (ref 70–99)
Potassium: 3.2 mmol/L — ABNORMAL LOW (ref 3.5–5.1)
Sodium: 131 mmol/L — ABNORMAL LOW (ref 135–145)
Total Bilirubin: 0.5 mg/dL (ref 0.3–1.2)
Total Protein: 7.2 g/dL (ref 6.5–8.1)

## 2023-03-05 LAB — TROPONIN I (HIGH SENSITIVITY): Troponin I (High Sensitivity): 10 ng/L (ref <18)

## 2023-03-05 LAB — BRAIN NATRIURETIC PEPTIDE: B Natriuretic Peptide: 2139 pg/mL — ABNORMAL HIGH (ref 0.0–100.0)

## 2023-03-05 MED ORDER — ACETAMINOPHEN 500 MG PO TABS: 1000.0000 mg | ORAL_TABLET | Freq: Four times a day (QID) | ORAL | Status: DC | PRN

## 2023-03-05 MED ORDER — IOHEXOL 350 MG/ML SOLN
75.0000 mL | Freq: Once | INTRAVENOUS | Status: AC | PRN
  Administered 2023-03-05: 75 mL via INTRAVENOUS

## 2023-03-05 MED ORDER — AMLODIPINE BESYLATE 5 MG PO TABS
5.0000 mg | ORAL_TABLET | Freq: Every day | ORAL | Status: DC
  Administered 2023-03-06: 5 mg via ORAL
  Filled 2023-03-05: qty 1

## 2023-03-05 MED ORDER — ACETAMINOPHEN 325 MG PO TABS
650.0000 mg | ORAL_TABLET | Freq: Four times a day (QID) | ORAL | Status: DC | PRN
  Administered 2023-03-06: 650 mg via ORAL

## 2023-03-05 MED ORDER — HYDRALAZINE HCL 20 MG/ML IJ SOLN: 5.0000 mg | INTRAMUSCULAR | Status: DC | PRN

## 2023-03-05 MED ORDER — SENNOSIDES-DOCUSATE SODIUM 8.6-50 MG PO TABS
2.0000 | ORAL_TABLET | Freq: Every day | ORAL | Status: DC
  Filled 2023-03-05: qty 2

## 2023-03-05 MED ORDER — HEPARIN SODIUM (PORCINE) 5000 UNIT/ML IJ SOLN
5000.0000 [IU] | Freq: Three times a day (TID) | INTRAMUSCULAR | Status: DC
  Administered 2023-03-06: 5000 [IU] via SUBCUTANEOUS
  Filled 2023-03-05: qty 1

## 2023-03-05 MED ORDER — LEVETIRACETAM 500 MG PO TABS: 1000.0000 mg | ORAL_TABLET | ORAL | Status: DC

## 2023-03-05 MED ORDER — LEVETIRACETAM 500 MG PO TABS
1000.0000 mg | ORAL_TABLET | Freq: Every evening | ORAL | Status: DC
  Administered 2023-03-05 – 2023-03-06 (×2): 1000 mg via ORAL
  Filled 2023-03-05 (×2): qty 2

## 2023-03-05 MED ORDER — CLONIDINE HCL 0.1 MG PO TABS
0.1000 mg | ORAL_TABLET | Freq: Two times a day (BID) | ORAL | Status: DC
  Administered 2023-03-06 (×2): 0.1 mg via ORAL
  Filled 2023-03-05 (×2): qty 1

## 2023-03-05 MED ORDER — NALOXONE HCL 4 MG/0.1ML NA LIQD: 0.4000 mg | Freq: Once | NASAL | Status: DC

## 2023-03-05 MED ORDER — ACETAMINOPHEN 650 MG RE SUPP: 650.0000 mg | Freq: Four times a day (QID) | RECTAL | Status: DC | PRN

## 2023-03-05 MED ORDER — CARVEDILOL 12.5 MG PO TABS
12.5000 mg | ORAL_TABLET | Freq: Two times a day (BID) | ORAL | Status: DC
  Administered 2023-03-06 (×2): 12.5 mg via ORAL
  Filled 2023-03-05 (×2): qty 1

## 2023-03-05 MED ORDER — PANTOPRAZOLE SODIUM 40 MG PO TBEC
40.0000 mg | DELAYED_RELEASE_TABLET | Freq: Every day | ORAL | Status: DC
  Administered 2023-03-06: 40 mg via ORAL
  Filled 2023-03-05: qty 1

## 2023-03-05 MED ORDER — FUROSEMIDE 10 MG/ML IJ SOLN
80.0000 mg | Freq: Once | INTRAMUSCULAR | Status: AC
  Administered 2023-03-05: 80 mg via INTRAVENOUS
  Filled 2023-03-05: qty 8

## 2023-03-05 MED ORDER — VANCOMYCIN HCL IN DEXTROSE 1-5 GM/200ML-% IV SOLN
1000.0000 mg | Freq: Once | INTRAVENOUS | Status: AC
  Administered 2023-03-05: 1000 mg via INTRAVENOUS
  Filled 2023-03-05: qty 200

## 2023-03-05 NOTE — ED Notes (Signed)
Patient transported to CT 
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
## 2023-03-05 NOTE — ED Triage Notes (Signed)
Per EMS, Pt, from [facility], c/o increasing SOB w/ exertion xa couple weeks and nausea starting today.  Denies new pain (chronic back pain).  Pt reports after completing dialysis, she was sat up, and she was unable to catch her breath.  Initial O2 sat 88% RA at dialysis.

## 2023-03-05 NOTE — ED Notes (Signed)
ED TO INPATIENT HANDOFF REPORT  ED Nurse Name and Phone #: Jacques Earthly Name/Age/Gender [name] 57 y.o. female Room/Bed: APA02/APA02  Code Status   Code Status: Full Code  Home/SNF/Other Home Patient oriented to: self, place, time, and situation Is this baseline? Yes   Triage Complete: Triage complete  Chief Complaint Dyspnea [R06.00]  Triage Note Per EMS, Pt, from [facility], c/o increasing SOB w/ exertion xa couple weeks and nausea starting today.  Denies new pain (chronic back pain).  Pt reports after completing dialysis, she was sat up, and she was unable to catch her breath.  Initial O2 sat 88% RA at dialysis.     Allergies Allergies  Allergen Reactions   Benadryl [Diphenhydramine Hcl (Sleep)] Hives   Daptomycin Hives   Linezolid     Other Reaction(s): GI Intolerance  Patient self-discontinued treatment due to GI intolerance. Taking it along with moxifloxacin   Moxifloxacin     Other Reaction(s): GI Intolerance  Patient self-discontinued treatment due to GI intolerance. Taking it along with linezolid   Quinine Derivatives Other (See Comments)    Alters mental status   Vancomycin     Pt is tolerating this medication at HD   Azithromycin Itching and Rash   Tetracycline Itching    Able to tolerate Doxycycline.    Zosyn [Piperacillin Sod-Tazobactam So] Rash    Level of Care/Admitting Diagnosis ED Disposition     ED Disposition  Admit   Condition  --   Comment  Hospital Area: [facility] [[id number]]  Level of Care: Med-Surg [16]  Covid Evaluation: Asymptomatic - no recent exposure (last 10 days) testing not required  Diagnosis: Dyspnea [409811]  Admitting Physician: Chiquita Loth  Attending Physician: Randol Kern, DAWOOD S [[id number]]          B Medical/Surgery History Past Medical History:  Diagnosis Date   Abnormal uterine bleeding (AUB) 06/15/2014   Cancer (HCC)    uterine   High blood pressure    Paraplegia  (lower)    Seizure disorder (HCC)    Seizures (HCC)    Suprapubic catheter (HCC)    Urinary tract infection    Past Surgical History:  Procedure Laterality Date   APPLICATION OF WOUND VAC Right 09/13/2021   (approximately 1-45mos ago) pressure sore on right hip   BACK SURGERY     Pt stated "before 2000"   BIOPSY  12/03/2022   Procedure: BIOPSY;  Surgeon: Dolores Frame, MD;  [facility]: [facility];  Service: Gastroenterology;;   ESOPHAGOGASTRODUODENOSCOPY N/A 09/20/2015   Procedure: ESOPHAGOGASTRODUODENOSCOPY (EGD);  Surgeon: Malissa Hippo, MD;  Location: [facility];  Service: Endoscopy;  Laterality: N/A;  730   ESOPHAGOGASTRODUODENOSCOPY (EGD) WITH PROPOFOL N/A 12/03/2022   Procedure: ESOPHAGOGASTRODUODENOSCOPY (EGD) WITH PROPOFOL;  Surgeon: Dolores Frame, MD;  Location: [facility];  Service: Gastroenterology;  Laterality: N/A;  1:15 pm, asa 3, pt knows to arrive at 10:30  dialysis pt, M,W & F   IR CATHETER TUBE CHANGE  04/02/2018   PERCUTANEOUS ENDOSCOPIC GASTROSTOMY (PEG) REMOVAL N/A 09/20/2015   Procedure: PERCUTANEOUS ENDOSCOPIC GASTROSTOMY (PEG) REMOVAL;  Surgeon: Malissa Hippo, MD;  [facility];  Service: Endoscopy;  Laterality: N/A;   TEE WITHOUT CARDIOVERSION N/A 11/11/2022   Procedure: TRANSESOPHAGEAL ECHOCARDIOGRAM (TEE);  Surgeon: Pricilla Riffle, MD;  Location: [facility];  Service: Cardiovascular;  Laterality: N/A;     A IV Location/Drains/Wounds Patient Lines/Drains/Airways Status     Active Line/Drains/Airways  Name Placement date Placement time Site Days   Peripheral IV 03/05/23 20 G Anterior;Proximal;Right Forearm 03/05/23  1620  Forearm  less than 1   Fistula / Graft Left Upper arm Arteriovenous fistula --  --  Upper arm  --   Suprapubic Catheter Latex --  --  Latex  --   Pressure Injury 02/26/22 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 02/26/22  0120  -- 372    Pressure Injury 02/26/22 Ischial tuberosity Right Stage 4 - Full thickness tissue loss with exposed bone, tendon or muscle. 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 with 1.5cm depth small amount of serous exudate 07/24/22  2239  Ischial tuberosity  224            Intake/Output Last 24 hours No intake or output data in the 24 hours ending 03/05/23 2035  Labs/Imaging Results for orders placed or performed during the hospital encounter of 03/05/23 (from the past 48 hour(s))  CBC with Differential     Status: Abnormal   Collection Time: 03/05/23  4:22 PM  Result Value Ref Range   WBC 7.0 4.0 - 10.5 K/uL   RBC 3.16 (L) 3.87 - 5.11 MIL/uL   Hemoglobin 8.2 (L) 12.0 - 15.0 g/dL   HCT 46.9 (L) 62.9 - 52.8 %   MCV 90.2 80.0 - 100.0 fL   MCH 25.9 (L) 26.0 - 34.0 pg   MCHC 28.8 (L) 30.0 - 36.0 g/dL   RDW 41.3 (H) 24.4 - 01.0 %   Platelets 283 150 - 400 K/uL   nRBC 0.0 0.0 - 0.2 %   Neutrophils Relative % 67 %   Neutro Abs 4.7 1.7 - 7.7 K/uL   Lymphocytes Relative 21 %   Lymphs Abs 1.5 0.7 - 4.0 K/uL   Monocytes Relative 7 %   Monocytes Absolute 0.5 0.1 - 1.0 K/uL   Eosinophils Relative 3 %   Eosinophils Absolute 0.2 0.0 - 0.5 K/uL   Basophils Relative 1 %   Basophils Absolute 0.1 0.0 - 0.1 K/uL   Immature Granulocytes 1 %   Abs Immature Granulocytes 0.06 0.00 - 0.07 K/uL    Comment: Performed at [facility], [address] [place], [place] [address]  Comprehensive metabolic panel     Status: Abnormal   Collection Time: 03/05/23  4:22 PM  Result Value Ref Range   Sodium 131 (L) 135 - 145 mmol/L   Potassium 3.2 (L) 3.5 - 5.1 mmol/L   Chloride 97 (L) 98 - 111 mmol/L   CO2 27 22 - 32 mmol/L   Glucose, Bld 101 (H) 70 - 99 mg/dL    Comment: Glucose reference range applies only to samples taken after fasting for at least 8 hours.   BUN 15 6 - 20 mg/dL   Creatinine, Ser 6.64 (H) 0.44 - 1.00 mg/dL    Calcium 7.6 (L) 8.9 - 10.3 mg/dL   Total Protein 7.2 6.5 - 8.1 g/dL   Albumin 2.2 (L) 3.5 - 5.0 g/dL   AST 15 15 - 41 U/L   ALT 9 0 - 44 U/L   Alkaline Phosphatase 77 38 - 126 U/L   Total Bilirubin 0.5 0.3 - 1.2 mg/dL   GFR, Estimated 31 (L) >60 mL/min    Comment: (NOTE) Calculated using the CKD-EPI Creatinine Equation (2021)    Anion gap 7 5 -  15    Comment: Performed at [facility], [address] [place], [place] [address]  Brain natriuretic peptide     Status: Abnormal   Collection Time: 03/05/23  4:22 PM  Result Value Ref Range   B Natriuretic Peptide 2,139.0 (H) 0.0 - 100.0 pg/mL    Comment: Performed at [facility], [address], [place], [place] [address]  Troponin I (High Sensitivity)     Status: None   Collection Time: 03/05/23  4:22 PM  Result Value Ref Range   Troponin I (High Sensitivity) 10 <18 ng/L    Comment: (NOTE) Elevated high sensitivity troponin I (hsTnI) values and significant  changes across serial measurements may suggest ACS but many other  chronic and acute conditions are known to elevate hsTnI results.  Refer to the "Links" section for chest pain algorithms and additional  guidance. Performed at [facility], [address], [place], [place] [address]   Blood gas, venous     Status: Abnormal   Collection Time: 03/05/23  4:22 PM  Result Value Ref Range   pH, Ven 7.49 (H) 7.25 - 7.43   pCO2, Ven 41 (L) 44 - 60 mmHg   pO2, Ven 53 (H) 32 - 45 mmHg   Bicarbonate 31.3 (H) 20.0 - 28.0 mmol/L   Acid-Base Excess 7.1 (H) 0.0 - 2.0 mmol/L   O2 Saturation 87.3 %   Patient temperature 36.7    Collection site BLOOD RIGHT FOREARM    Drawn by [phone]     Comment: Performed at [facility], [address], [place], [place] [address]   CT Angio Chest PE W and/or Wo Contrast  Result Date: 03/05/2023 CLINICAL DATA:  Pulmonary embolism (PE) suspected, high prob. Increasing shortness of breath with exertion. Nausea. EXAM: CT ANGIOGRAPHY CHEST WITH CONTRAST TECHNIQUE:  Multidetector CT imaging of the chest was performed using the standard protocol during bolus administration of intravenous contrast. Multiplanar CT image reconstructions and MIPs were obtained to evaluate the vascular anatomy. RADIATION DOSE REDUCTION: This exam was performed according to the departmental dose-optimization program which includes automated exposure control, adjustment of the mA and/or kV according to patient size and/or use of iterative reconstruction technique. CONTRAST:  75mL OMNIPAQUE IOHEXOL 350 MG/ML SOLN COMPARISON:  CT scan chest from 12/13/2022. FINDINGS: Cardiovascular: No evidence of embolism to the proximal subsegmental pulmonary artery level. Mild cardiomegaly. Stable mild pericardial effusion. No aortic aneurysm. Mediastinum/Nodes: Visualized thyroid gland appears grossly unremarkable. No solid / cystic mediastinal masses. The esophagus is nondistended precluding optimal assessment. No axillary, mediastinal or hilar lymphadenopathy by size criteria. Lungs/Pleura: The central tracheo-bronchial tree is patent. There are bilateral small-to-moderate pleural effusions, right more than left which also extend into the bilateral major fissures. There are resultant compressive atelectatic changes in the bilateral lower lobes. No mass or consolidation. No pneumothorax. No suspicious lung nodules. Upper Abdomen: Visualized upper abdominal viscera within normal limits. Musculoskeletal: The visualized soft tissues of the chest wall are grossly unremarkable. No suspicious osseous lesions. There are mild to moderate multilevel degenerative changes in the visualized spine. Posterior spinal fixation of thoracic spine noted. There is old fracture deformity of sternum. Review of the MIP images confirms the above findings. IMPRESSION: 1. No evidence of pulmonary embolism to the proximal subsegmental pulmonary artery level. 2. Small to moderate bilateral pleural effusions, right more than left with resultant  compressive atelectatic changes in the bilateral lower lobes. 3. Stable cardiomegaly and mild pericardial effusion. 4. Multiple other nonacute observations, as described above. Electronically Signed  By: Jules Schick M.D.   On: 03/05/2023 18:24   DG Chest Port 1 View  Result Date: 03/05/2023 CLINICAL DATA:  Shortness of breath. EXAM: PORTABLE CHEST 1 VIEW COMPARISON:  11/08/2022. FINDINGS: There are bilateral small pleural effusions with probable associated compressive atelectatic changes in the bilateral lower lobes. Bilateral lung fields are otherwise clear. No dense consolidation or lung collapse. Stable cardio-mediastinal silhouette. No acute osseous abnormalities. The soft tissues are within normal limits. IMPRESSION: 1. Bilateral small pleural effusions with probable associated compressive atelectatic changes in the bilateral lower lobes. Electronically Signed   By: Jules Schick M.D.   On: 03/05/2023 18:11    Pending Labs Unresulted Labs (From admission, onward)     Start     Ordered   03/06/23 0500  Basic metabolic panel  Tomorrow morning,   R        03/05/23 1918   03/06/23 0500  CBC  Tomorrow morning,   R        03/05/23 1918            Vitals/Pain Today's Vitals   03/05/23 1730 03/05/23 1830 03/05/23 1900 03/05/23 1930  BP: (!) 177/89 (!) 173/83 (!) 184/88 (!) 180/88  Pulse: 79 74 79 79  Resp: (!) 22 (!) 23 (!) 21 (!) 23  Temp:    97.8 F (36.6 C)  TempSrc:    Oral  SpO2: 93% 92% 96% 95%  Weight:      Height:      PainSc:        Isolation Precautions No active isolations  Medications Medications  heparin injection 5,000 Units (has no administration in time range)  acetaminophen (TYLENOL) tablet 650 mg (has no administration in time range)    Or  acetaminophen (TYLENOL) suppository 650 mg (has no administration in time range)  hydrALAZINE (APRESOLINE) injection 5 mg (has no administration in time range)  acetaminophen (TYLENOL) tablet 1,000 mg (has no  administration in time range)  amLODipine (NORVASC) tablet 5 mg (has no administration in time range)  carvedilol (COREG) tablet 12.5 mg (has no administration in time range)  cloNIDine (CATAPRES) tablet 0.1 mg (has no administration in time range)  pantoprazole (PROTONIX) EC tablet 40 mg (has no administration in time range)  senna-docusate (Senokot-S) tablet 2 tablet (has no administration in time range)  naloxone (NARCAN) nasal spray 4 mg/0.1 mL (has no administration in time range)  levETIRAcetam (KEPPRA) tablet 1,000 mg (1,000 mg Oral Given 03/05/23 1936)  levETIRAcetam (KEPPRA) tablet 1,000 mg (has no administration in time range)  vancomycin (VANCOCIN) IVPB 1000 mg/200 mL premix (1,000 mg Intravenous New Bag/Given 03/05/23 2014)  iohexol (OMNIPAQUE) 350 MG/ML injection 75 mL (75 mLs Intravenous Contrast Given 03/05/23 1755)  furosemide (LASIX) injection 80 mg (80 mg Intravenous Given 03/05/23 1938)    Mobility non-ambulatory     Focused Assessments    R Recommendations: See Admitting Provider Note  Report given to:   Additional Notes: A&O; 20G RFA; No sticks/BP to L arm; Suprapubic catheter

## 2023-03-05 NOTE — Consult Note (Signed)
Pharmacy Antibiotic Note  [name] is a 57 y.o. female admitted on 03/05/2023 with  shortness of breath .  Patient has a history of end-stage renal disease and is currently on dialysis which she has been undergoing for about 4 or 5 years and her access is in the left upper extremity. Pharmacy has been consulted for Vancomycin dosing.  Plan: - Vancomycin 1g IV x 1 as loading dose. - Will defer scheduled dosing currently until in-patient hemodialysis schedule is determined. - Once schedule is available, will order Vancomycin 500mg  with each dialysis session.  Height: 5\' 3"  (160 cm) Weight: 44.9 kg (99 lb) IBW/kg (Calculated) : 52.4  Temp (24hrs), Avg:97.9 F (36.6 C), Min:97.8 F (36.6 C), Max:98 F (36.7 C)  Recent Labs  Lab 03/05/23 1622  WBC 7.0  CREATININE 1.87*    Estimated Creatinine Clearance: 24.1 mL/min (A) (by C-G formula based on SCr of 1.87 mg/dL (H)).    Allergies  Allergen Reactions   Benadryl [Diphenhydramine Hcl (Sleep)] Hives   Daptomycin Hives   Linezolid     Other Reaction(s): GI Intolerance  Patient self-discontinued treatment due to GI intolerance. Taking it along with moxifloxacin   Moxifloxacin     Other Reaction(s): GI Intolerance  Patient self-discontinued treatment due to GI intolerance. Taking it along with linezolid   Quinine Derivatives Other (See Comments)    Alters mental status   Vancomycin     Pt is tolerating this medication at HD   Azithromycin Itching and Rash   Tetracycline Itching    Able to tolerate Doxycycline.    Zosyn [Piperacillin Sod-Tazobactam So] Rash    Antimicrobials this admission: Vancomycin 10/9 >>   Dose adjustments this admission: N/A  Microbiology results: No cultures currently ordered.  Thank you for allowing pharmacy to be a part of this patient's care.  Bettey Costa 03/05/2023 7:53 PM

## 2023-03-05 NOTE — ED Provider Notes (Signed)
[place] EMERGENCY DEPARTMENT AT [facility] Provider Note   CSN: [id number] Arrival date & time: 03/05/23  1532     History  Chief Complaint  Patient presents with   Shortness of Breath   Nausea    [name] is a 57 y.o. female.   Shortness of Breath  The patient is a 57 year old female, she has a history of hypertension, she has a history of end-stage renal disease and is currently on dialysis which she has been undergoing for about 4 or 5 years and her access is in the left upper extremity.  She states that for the last several months she has been getting short of breath rather easily, she was at dialysis today and after finishing 3 hours of dialysis she became acutely short of breath when she tried to sit up.  She has not been coughing or having fevers and denies any swelling of her legs.  She has not had any coughing or vomiting although she is nauseated.  Vital signs on arrival show that the patient was hypertensive and tachypneic.  She arrives by paramedic transport.  The patient was 88% on room air while she was at dialysis    Home Medications Prior to Admission medications   Medication Sig Start Date End Date Taking? Authorizing Provider  acetaminophen (TYLENOL) 500 MG tablet Take 1,000 mg by mouth every 6 (six) hours as needed for moderate pain.    [provider]  albuterol (VENTOLIN HFA) 108 (90 Base) MCG/ACT inhaler Inhale 2 puffs into the lungs every 4 (four) hours as needed for wheezing or shortness of breath. 11/12/22   Shon Hale, MD  amLODipine (NORVASC) 5 MG tablet Take 1 tablet (5 mg total) by mouth daily. Take 1 tablet on Monday,Wednesday and Friday then take 2 tablets on Tuesday,Thursday,Saturday and Sunday. Patient taking differently: Take 5 mg by mouth See admin instructions. Take 1 tablet on Monday,Wednesday and Friday then take 2 tablets on Tuesday,Thursday,Saturday and Sunday. 11/12/22   Shon Hale, MD  B Complex-C-Folic  Acid (RENA-VITE RX PO) Take 1 tablet by mouth every morning.    [provider]  buprenorphine (BUTRANS) 7.5 MCG/HR Place 1 patch onto the skin once a week. 12/17/22   Shahmehdi, Gemma Payor, MD  carvedilol (COREG) 6.25 MG tablet Take 6.25 mg by mouth 2 (two) times daily with a meal.    [provider]  cefTRIAXone 2 g in sodium chloride 0.9 % 100 mL Inject 2 g into the vein daily. 12/18/22   Shahmehdi, Gemma Payor, MD  cloNIDine (CATAPRES) 0.1 MG tablet Take 1 tablet (0.1 mg total) by mouth 2 (two) times daily. 11/12/22   Shon Hale, MD  furosemide (LASIX) 20 MG tablet Take 20 mg by mouth daily. 08/14/21   [provider]  irbesartan (AVAPRO) 150 MG tablet Take 150 mg by mouth daily at 6 PM.    [provider]  levETIRAcetam (KEPPRA) 500 MG tablet Take 1,000 mg by mouth every evening. Take an additional tablet on Monday,Wednesday and Friday evening after Dialysis 08/09/21   [provider]  lidocaine-prilocaine (EMLA) cream Apply 1 Application topically 3 (three) times a week. 06/04/22   [provider]  naloxone [facility]) nasal spray 4 mg/0.1 mL Place 0.4 mg into the nose once. 10/03/22   [provider]  pantoprazole (PROTONIX) 40 MG tablet Take 40 mg by mouth daily. 08/03/22   [provider]  senna-docusate (SENOKOT-S) 8.6-50 MG tablet Take 2 tablets by mouth at  bedtime. 11/12/22   Shon Hale, MD  sevelamer carbonate (RENVELA) 800 MG tablet Take 800 mg by mouth 3 (three) times daily. 12/11/22   [provider]  vancomycin (VANCOREADY) 500 MG/100ML IVPB Inject 100 mLs (500 mg total) into the vein every Monday, Wednesday, and Friday with hemodialysis. 12/18/22   Kendell Bane, MD      Allergies    Benadryl [diphenhydramine hcl (sleep)], Daptomycin, Linezolid, Moxifloxacin, Quinine derivatives, Vancomycin, Azithromycin, Tetracycline, and Zosyn [piperacillin sod-tazobactam so]    Review of Systems   Review of Systems   Respiratory:  Positive for shortness of breath.   All other systems reviewed and are negative.   Physical Exam Updated Vital Signs BP (!) 173/83   Pulse 74   Temp 98 F (36.7 C) (Oral)   Resp (!) 23   Ht 1.6 m (5\' 3" )   Wt 44.9 kg   LMP  (LMP Unknown)   SpO2 92%   BMI 17.54 kg/m  Physical Exam Vitals and nursing note reviewed.  Constitutional:      General: She is not in acute distress.    Appearance: She is well-developed.  HENT:     Head: Normocephalic and atraumatic.     Mouth/Throat:     Pharynx: No oropharyngeal exudate.  Eyes:     General: No scleral icterus.       Right eye: No discharge.        Left eye: No discharge.     Conjunctiva/sclera: Conjunctivae normal.     Pupils: Pupils are equal, round, and reactive to light.  Neck:     Thyroid: No thyromegaly.     Vascular: No JVD.     Comments: JVD present in the bilateral neck Cardiovascular:     Rate and Rhythm: Normal rate and regular rhythm.     Heart sounds: Normal heart sounds. No murmur heard.    No friction rub. No gallop.  Pulmonary:     Effort: Respiratory distress present.     Breath sounds: Rales present. No wheezing.     Comments: Tachypneic, rales at the bases bilaterally Abdominal:     General: Bowel sounds are normal. There is no distension.     Palpations: Abdomen is soft. There is no mass.     Tenderness: There is no abdominal tenderness.  Musculoskeletal:        General: No tenderness. Normal range of motion.     Cervical back: Normal range of motion and neck supple.     Right lower leg: No edema.     Left lower leg: No edema.     Comments: Fistula in the left upper extremity with a good thrill  Lymphadenopathy:     Cervical: No cervical adenopathy.  Skin:    General: Skin is warm and dry.     Findings: No erythema or rash.  Neurological:     General: No focal deficit present.     Mental Status: She is alert.     Coordination: Coordination normal.  Psychiatric:        Behavior:  Behavior normal.     ED Results / Procedures / Treatments   Labs (all labs ordered are listed, but only abnormal results are displayed) Labs Reviewed  CBC WITH DIFFERENTIAL/PLATELET - Abnormal; Notable for the following components:      Result Value   RBC 3.16 (*)    Hemoglobin 8.2 (*)    HCT 28.5 (*)    MCH 25.9 (*)    MCHC  28.8 (*)    RDW 18.4 (*)    All other components within normal limits  COMPREHENSIVE METABOLIC PANEL - Abnormal; Notable for the following components:   Sodium 131 (*)    Potassium 3.2 (*)    Chloride 97 (*)    Glucose, Bld 101 (*)    Creatinine, Ser 1.87 (*)    Calcium 7.6 (*)    Albumin 2.2 (*)    GFR, Estimated 31 (*)    All other components within normal limits  BRAIN NATRIURETIC PEPTIDE - Abnormal; Notable for the following components:   B Natriuretic Peptide 2,139.0 (*)    All other components within normal limits  BLOOD GAS, VENOUS - Abnormal; Notable for the following components:   pH, Ven 7.49 (*)    pCO2, Ven 41 (*)    pO2, Ven 53 (*)    Bicarbonate 31.3 (*)    Acid-Base Excess 7.1 (*)    All other components within normal limits  TROPONIN I (HIGH SENSITIVITY)    EKG EKG Interpretation Date/Time:  Wednesday March 05 2023 15:43:40 EDT Ventricular Rate:  80 PR Interval:  140 QRS Duration:  115 QT Interval:  471 QTC Calculation: 544 R Axis:   -33  Text Interpretation: Sinus rhythm Probable left atrial enlargement Incomplete RBBB and LAFB Left ventricular hypertrophy since last tracing no significant change Confirmed by Eber Hong ([id number]) on 03/05/2023 3:49:03 PM  Radiology CT Angio Chest PE W and/or Wo Contrast  Result Date: 03/05/2023 CLINICAL DATA:  Pulmonary embolism (PE) suspected, high prob. Increasing shortness of breath with exertion. Nausea. EXAM: CT ANGIOGRAPHY CHEST WITH CONTRAST TECHNIQUE: Multidetector CT imaging of the chest was performed using the standard protocol during bolus administration of intravenous contrast.  Multiplanar CT image reconstructions and MIPs were obtained to evaluate the vascular anatomy. RADIATION DOSE REDUCTION: This exam was performed according to the departmental dose-optimization program which includes automated exposure control, adjustment of the mA and/or kV according to patient size and/or use of iterative reconstruction technique. CONTRAST:  75mL OMNIPAQUE IOHEXOL 350 MG/ML SOLN COMPARISON:  CT scan chest from 12/13/2022. FINDINGS: Cardiovascular: No evidence of embolism to the proximal subsegmental pulmonary artery level. Mild cardiomegaly. Stable mild pericardial effusion. No aortic aneurysm. Mediastinum/Nodes: Visualized thyroid gland appears grossly unremarkable. No solid / cystic mediastinal masses. The esophagus is nondistended precluding optimal assessment. No axillary, mediastinal or hilar lymphadenopathy by size criteria. Lungs/Pleura: The central tracheo-bronchial tree is patent. There are bilateral small-to-moderate pleural effusions, right more than left which also extend into the bilateral major fissures. There are resultant compressive atelectatic changes in the bilateral lower lobes. No mass or consolidation. No pneumothorax. No suspicious lung nodules. Upper Abdomen: Visualized upper abdominal viscera within normal limits. Musculoskeletal: The visualized soft tissues of the chest wall are grossly unremarkable. No suspicious osseous lesions. There are mild to moderate multilevel degenerative changes in the visualized spine. Posterior spinal fixation of thoracic spine noted. There is old fracture deformity of sternum. Review of the MIP images confirms the above findings. IMPRESSION: 1. No evidence of pulmonary embolism to the proximal subsegmental pulmonary artery level. 2. Small to moderate bilateral pleural effusions, right more than left with resultant compressive atelectatic changes in the bilateral lower lobes. 3. Stable cardiomegaly and mild pericardial effusion. 4. Multiple  other nonacute observations, as described above. Electronically Signed   By: Jules Schick M.D.   On: 03/05/2023 18:24   DG Chest Port 1 View  Result Date: 03/05/2023 CLINICAL DATA:  Shortness of breath. EXAM: PORTABLE  CHEST 1 VIEW COMPARISON:  11/08/2022. FINDINGS: There are bilateral small pleural effusions with probable associated compressive atelectatic changes in the bilateral lower lobes. Bilateral lung fields are otherwise clear. No dense consolidation or lung collapse. Stable cardio-mediastinal silhouette. No acute osseous abnormalities. The soft tissues are within normal limits. IMPRESSION: 1. Bilateral small pleural effusions with probable associated compressive atelectatic changes in the bilateral lower lobes. Electronically Signed   By: Jules Schick M.D.   On: 03/05/2023 18:11    Procedures Procedures    Medications Ordered in ED Medications  iohexol (OMNIPAQUE) 350 MG/ML injection 75 mL (75 mLs Intravenous Contrast Given 03/05/23 1755)    ED Course/ Medical Decision Making/ A&P                                 Medical Decision Making Amount and/or Complexity of Data Reviewed Labs: ordered. Radiology: ordered.  Risk Prescription drug management. Decision regarding hospitalization.      This patient presents to the ED for concern of redness of breath, this involves an extensive number of treatment options, and is a complaint that carries with it a high risk of complications and morbidity.  The differential diagnosis includes :This patient is very weak, she is short of breath, she has rales and was hypoxic.  She is requiring 2 L of oxygen.  The EKG does not reveal any specific abnormalities and is unchanged from prior EKGs but there is certainly concern that her increasing shortness of breath could be related to cardiac output, congestive heart failure or possibly pneumonia.  Given the length of time this is been going on over a month I suspect it is can to be pulmonary  edema and congestive heart failure.  She has undergone dialysis but still sounds wet, she may have pulmonary fibrosis on top of that but is never been a smoker.  She has no history of COPD and does not use albuterol inhalers.   Co morbidities that complicate the patient evaluation  End-stage renal disease on dialysis   Additional history obtained:  Additional history obtained from medical record External records from outside source obtained and reviewed including echocardiogram performed on November 09, 2022 showing ejection fraction of 50 to 55% with grade 2 diastolic dysfunction Transesophageal echocardiogram performed on November 11, 2022 showed no vegetations on the heart valves, left ventricular normal function, right ventricular systolic function was normal no significant valvular abnormalities there was a color-flow Doppler with agitated saline bubble study which showed suggestion of an interatrial shunt The patient reports to me that she has had prior blood clots in her legs and her chest however angiograms dating back to 2016 have not shown any pulmonary embolism   Lab Tests:  I Ordered, and personally interpreted labs.  The pertinent results include: CBC without leukocytosis, chronic anemia is present, BNP is over 2000 after dialysis, metabolic panel was unremarkable given underlying end-stage renal disease   Imaging Studies ordered:  I ordered imaging studies including chest x-ray and CT angiogram I independently visualized and interpreted imaging which showed bilateral effusions, no signs of pulmonary embolism I agree with the radiologist interpretation   Cardiac Monitoring: / EKG:  The patient was maintained on a cardiac monitor.  I personally viewed and interpreted the cardiac monitored which showed an underlying rhythm of: Sinus rhythm   Consultations Obtained:  I requested consultation with the hospitalist Dr. Randol Kern,  and discussed lab and imaging findings as  well as  pertinent plan - they recommend: Admission to the hospital, may need another session of dialysis   Problem List / ED Course / Critical interventions / Medication management  Patient appears to have a new oxygen requirement, on 2 L she comes up to 93% from 88, she is tachypneic I ordered medication including oxygen for hyper [place] improved Reevaluation of the patient after these medicines showed that the patient improved I have reviewed the patients home medicines and have made adjustments as needed   Social Determinants of Health:  End-stage renal disease on dialysis   Test / Admission - Considered:  Admit to hospital         Final Clinical Impression(s) / ED Diagnoses Final diagnoses:  Acute hypoxic respiratory failure (HCC)  Acute on chronic congestive heart failure, unspecified heart failure type [facility])    Rx / DC Orders ED Discharge Orders     None         Eber Hong, MD 03/05/23 1839

## 2023-03-05 NOTE — ED Notes (Addendum)
Attempted to call report to [phone]

## 2023-03-05 NOTE — ED Notes (Signed)
ED Provider at bedside. 

## 2023-03-06 ENCOUNTER — Observation Stay (HOSPITAL_COMMUNITY): Payer: 59

## 2023-03-06 DIAGNOSIS — R0609 Other forms of dyspnea: Secondary | ICD-10-CM | POA: Diagnosis not present

## 2023-03-06 DIAGNOSIS — R0602 Shortness of breath: Secondary | ICD-10-CM | POA: Diagnosis not present

## 2023-03-06 LAB — BASIC METABOLIC PANEL
Anion gap: 7 (ref 5–15)
BUN: 20 mg/dL (ref 6–20)
CO2: 27 mmol/L (ref 22–32)
Calcium: 7.9 mg/dL — ABNORMAL LOW (ref 8.9–10.3)
Chloride: 97 mmol/L — ABNORMAL LOW (ref 98–111)
Creatinine, Ser: 2.72 mg/dL — ABNORMAL HIGH (ref 0.44–1.00)
GFR, Estimated: 20 mL/min — ABNORMAL LOW (ref >60)
Glucose, Bld: 94 mg/dL (ref 70–99)
Potassium: 3.7 mmol/L (ref 3.5–5.1)
Sodium: 131 mmol/L — ABNORMAL LOW (ref 135–145)

## 2023-03-06 LAB — CBC WITH DIFFERENTIAL/PLATELET
Abs Immature Granulocytes: 0.08 10*3/uL — ABNORMAL HIGH (ref 0.00–0.07)
Basophils Absolute: 0.1 10*3/uL (ref 0.0–0.1)
Basophils Relative: 0 %
Eosinophils Absolute: 0.2 10*3/uL (ref 0.0–0.5)
Eosinophils Relative: 1 %
HCT: 27.7 % — ABNORMAL LOW (ref 36.0–46.0)
Hemoglobin: 7.8 g/dL — ABNORMAL LOW (ref 12.0–15.0)
Immature Granulocytes: 1 %
Lymphocytes Relative: 12 %
Lymphs Abs: 1.6 10*3/uL (ref 0.7–4.0)
MCH: 25.7 pg — ABNORMAL LOW (ref 26.0–34.0)
MCHC: 28.2 g/dL — ABNORMAL LOW (ref 30.0–36.0)
MCV: 91.4 fL (ref 80.0–100.0)
Monocytes Absolute: 0.5 10*3/uL (ref 0.1–1.0)
Monocytes Relative: 3 %
Neutro Abs: 11.5 10*3/uL — ABNORMAL HIGH (ref 1.7–7.7)
Neutrophils Relative %: 83 %
Platelets: 262 10*3/uL (ref 150–400)
RBC: 3.03 MIL/uL — ABNORMAL LOW (ref 3.87–5.11)
RDW: 18.6 % — ABNORMAL HIGH (ref 11.5–15.5)
WBC: 13.9 10*3/uL — ABNORMAL HIGH (ref 4.0–10.5)
nRBC: 0 % (ref 0.0–0.2)

## 2023-03-06 LAB — CBC
HCT: 26.8 % — ABNORMAL LOW (ref 36.0–46.0)
Hemoglobin: 7.6 g/dL — ABNORMAL LOW (ref 12.0–15.0)
MCH: 25.9 pg — ABNORMAL LOW (ref 26.0–34.0)
MCHC: 28.4 g/dL — ABNORMAL LOW (ref 30.0–36.0)
MCV: 91.2 fL (ref 80.0–100.0)
Platelets: 288 10*3/uL (ref 150–400)
RBC: 2.94 MIL/uL — ABNORMAL LOW (ref 3.87–5.11)
RDW: 18.7 % — ABNORMAL HIGH (ref 11.5–15.5)
WBC: 16.6 10*3/uL — ABNORMAL HIGH (ref 4.0–10.5)
nRBC: 0 % (ref 0.0–0.2)

## 2023-03-06 LAB — PROCALCITONIN: Procalcitonin: 1.09 ng/mL

## 2023-03-06 LAB — HEPATITIS B SURFACE ANTIGEN: Hepatitis B Surface Ag: NONREACTIVE

## 2023-03-06 LAB — LACTIC ACID, PLASMA: Lactic Acid, Venous: 0.6 mmol/L (ref 0.5–1.9)

## 2023-03-06 MED ORDER — TORSEMIDE 20 MG PO TABS
100.0000 mg | ORAL_TABLET | Freq: Two times a day (BID) | ORAL | Status: DC
  Administered 2023-03-06: 100 mg via ORAL
  Filled 2023-03-06: qty 5

## 2023-03-06 MED ORDER — PENTAFLUOROPROP-TETRAFLUOROETH EX AERO: 1.0000 | INHALATION_SPRAY | CUTANEOUS | Status: DC | PRN

## 2023-03-06 MED ORDER — TORSEMIDE 100 MG PO TABS: 100.0000 mg | ORAL_TABLET | Freq: Two times a day (BID) | ORAL | 1 refills | Status: DC

## 2023-03-06 MED ORDER — LIDOCAINE-PRILOCAINE 2.5-2.5 % EX CREA: 1.0000 | TOPICAL_CREAM | CUTANEOUS | Status: DC | PRN

## 2023-03-06 MED ORDER — LIDOCAINE HCL (PF) 1 % IJ SOLN: 5.0000 mL | INTRAMUSCULAR | Status: DC | PRN

## 2023-03-06 MED ORDER — ACETAMINOPHEN 325 MG PO TABS
ORAL_TABLET | ORAL | Status: AC
  Filled 2023-03-06: qty 2

## 2023-03-06 MED ORDER — LIDOCAINE 5 % EX PTCH
1.0000 | MEDICATED_PATCH | CUTANEOUS | Status: DC
  Administered 2023-03-06: 1 via TRANSDERMAL
  Filled 2023-03-06: qty 1

## 2023-03-06 NOTE — Consult Note (Addendum)
WOC Nurse Consult Note:  Consult requested for bilat ischium wounds.  Performed remotely after review of the progress notes and photos in the EMR.  Pt is familiar to the WOC team from a previous admission 7/21 and she is followed as an outpatient at the wound center at [facility]  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 Dressing procedure/placement/frequency: Air mattress ordered to reduce pressure. Topical treatment orders provided for bedside nurses to perform as follows to provide antimicrobial benefits and absorb drainage:  Apply Aquacel [place] # [phone]) packing to left and right ischium wounds Q day, using swab to fill, then cover with foam dressings.  Change foam dressings Q 3 days or PRN soiling. Pt should follow-up with the outpatient wound care center after discharge.  Please re-consult if further assistance is needed.  Thank-you,  Cammie Mcgee MSN, RN, CWOCN, [place], CNS [phone]

## 2023-03-06 NOTE — Progress Notes (Signed)
   03/06/23 1126  TOC Brief Assessment  Insurance and Status Reviewed  Patient has primary care physician Yes  Home environment has been reviewed Home with children  Prior level of function: needs assistance  Prior/Current Home Services No current home services  Social Determinants of Health Reivew SDOH reviewed no interventions necessary  Readmission risk has been reviewed Yes  Transition of care needs no transition of care needs at this time    Discharge home after HD.  Transition of Care Department [facility]) has reviewed patient and no TOC needs have been identified at this time. We will continue to monitor patient advancement through interdisciplinary progression rounds. If new patient transition needs arise, please place a TOC consult.

## 2023-03-06 NOTE — Consult Note (Addendum)
Ruther Dement Admit Date: 03/05/2023 03/06/2023 [name] Requesting Physician:  Flossie Dibble MD  Reason for Consult:  ESRD, AHRF, Pulm Effusions HPI:  [name] ESRD MWF [facility] using LUE AVF presented to the today from dialysis with acute dyspnea and hypoxia.  CTA of the chest negative for PE but bilateral mild to moderate effusions noted with associated atelectasis.  Patient's blood pressures moderately elevated.  Pain labs at triage with K of 3.2 which has improved to 3.7 consistent with equilibration postdialysis, BUN 20, bicarbonate 27.  Hemoglobin is 7.8 this morning.  She has no specific complaints this morning.  She is unable to recall if she achieves her estimated dry weight at dialysis.  She states that she has high blood pressure and struggles with intradialytic hypotension.  It appears that her current hypertensive medications include carvedilol clonidine, amlodipine.  Here she is afebrile.  She is currently on room air.  WBC is 13.9, was 16.6 at presentation.  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   ROS Balance of 12 systems is negative w/ exceptions as above  PMH  Past Medical History:  Diagnosis Date   Abnormal uterine bleeding (AUB) 06/15/2014   Cancer (HCC)    uterine   High blood pressure    Paraplegia (lower)    Seizure disorder (HCC)    Seizures (HCC)    Suprapubic catheter (HCC)    Urinary tract infection    PSH  Past Surgical History:  Procedure Laterality Date   APPLICATION OF WOUND VAC Right 09/13/2021   (approximately 1-94mos ago) pressure sore on right hip   BACK SURGERY     Pt stated "before 2000"   BIOPSY  12/03/2022   Procedure: BIOPSY;  Surgeon: Dolores Frame, MD;  [facility]: [facility];  Service: Gastroenterology;;   ESOPHAGOGASTRODUODENOSCOPY N/A 09/20/2015   Procedure:  ESOPHAGOGASTRODUODENOSCOPY (EGD);  Surgeon: Malissa Hippo, MD;  Location: [facility];  Service: Endoscopy;  Laterality: N/A;  730   ESOPHAGOGASTRODUODENOSCOPY (EGD) WITH PROPOFOL N/A 12/03/2022   Procedure: ESOPHAGOGASTRODUODENOSCOPY (EGD) WITH PROPOFOL;  Surgeon: Dolores Frame, MD;  Location: [facility];  Service: Gastroenterology;  Laterality: N/A;  1:15 pm, asa 3, pt knows to arrive at 10:30  dialysis pt, M,W & F   IR CATHETER TUBE CHANGE  04/02/2018   PERCUTANEOUS ENDOSCOPIC GASTROSTOMY (PEG) REMOVAL N/A 09/20/2015   Procedure: PERCUTANEOUS ENDOSCOPIC GASTROSTOMY (PEG) REMOVAL;  Surgeon: Malissa Hippo, MD;  [facility]: [facility];  Service: Endoscopy;  Laterality: N/A;   TEE WITHOUT CARDIOVERSION N/A 11/11/2022   Procedure: TRANSESOPHAGEAL ECHOCARDIOGRAM (TEE);  Surgeon: Pricilla Riffle, MD;  Location: [facility];  Service: Cardiovascular;  Laterality: N/A;   FH  Family History  Problem Relation Age of Onset   Cancer Mother    Hypertension Mother    Cancer Sister        breast and then spread everywhere.   Diabetes Paternal Grandmother    Hypertension Paternal Grandmother    SH  reports that she has never smoked. She has never used smokeless tobacco. She reports current alcohol use. She reports that she does not use drugs. Allergies  Allergies  Allergen Reactions   Benadryl [Diphenhydramine Hcl (Sleep)] Hives   Daptomycin Hives   Linezolid     Other Reaction(s): GI Intolerance  Patient self-discontinued treatment due to GI intolerance. Taking it along with moxifloxacin   Moxifloxacin     Other  Reaction(s): GI Intolerance  Patient self-discontinued treatment due to GI intolerance. Taking it along with linezolid   Quinine Derivatives Other (See Comments)    Alters mental status   Vancomycin     Pt is tolerating this medication at HD   Azithromycin Itching and Rash   Tetracycline Itching    Able to tolerate Doxycycline.    Zosyn [Piperacillin Sod-Tazobactam  So] Rash   Home medications Prior to Admission medications   Medication Sig Start Date End Date Taking? Authorizing Provider  acetaminophen (TYLENOL) 500 MG tablet Take 1,000 mg by mouth every 6 (six) hours as needed for moderate pain.   Yes [provider]  albuterol (VENTOLIN HFA) 108 (90 Base) MCG/ACT inhaler Inhale 2 puffs into the lungs every 4 (four) hours as needed for wheezing or shortness of breath. 11/12/22  Yes Emokpae, Courage, MD  amLODipine (NORVASC) 5 MG tablet Take 1 tablet (5 mg total) by mouth daily. Take 1 tablet on Monday,Wednesday and Friday then take 2 tablets on Tuesday,Thursday,Saturday and Sunday. Patient taking differently: Take 5 mg by mouth See admin instructions. Take 1 tablet on Monday,Wednesday and Friday then take 2 tablets on Tuesday,Thursday,Saturday and Sunday. 11/12/22  Yes Shon Hale, MD  carvedilol (COREG) 12.5 MG tablet Take 12.5 mg by mouth 2 (two) times daily with a meal. 12/24/22 03/24/23 Yes [provider]  cloNIDine (CATAPRES) 0.1 MG tablet Take 1 tablet (0.1 mg total) by mouth 2 (two) times daily. 11/12/22  Yes Emokpae, Courage, MD  cyclobenzaprine (FLEXERIL) 10 MG tablet Take 10 mg by mouth 3 (three) times daily. 01/29/23  Yes [provider]  furosemide (LASIX) 20 MG tablet Take 20 mg by mouth daily. 08/14/21  Yes [provider]  irbesartan (AVAPRO) 150 MG tablet Take 150 mg by mouth daily at 6 PM.   Yes [provider]  levETIRAcetam (KEPPRA) 500 MG tablet Take 1,000 mg by mouth every evening. Take an additional tablet on Monday,Wednesday and Friday evening after Dialysis 08/09/21  Yes [provider]  lidocaine-prilocaine (EMLA) cream Apply 1 Application topically 3 (three) times a week. 06/04/22  Yes [provider]  naloxone (NARCAN) nasal spray 4 mg/0.1 mL Place 0.4 mg into the nose once. 10/03/22  Yes [provider]  pantoprazole (PROTONIX) 40 MG tablet Take 40 mg by mouth daily.  08/03/22  Yes [provider]  senna-docusate (SENOKOT-S) 8.6-50 MG tablet Take 2 tablets by mouth at bedtime. 11/12/22  Yes Shon Hale, MD  vancomycin (VANCOREADY) 500 MG/100ML IVPB Inject 100 mLs (500 mg total) into the vein every Monday, Wednesday, and Friday with hemodialysis. 12/18/22  Yes Shahmehdi, Gemma Payor, MD  B Complex-C-Folic Acid (RENA-VITE RX PO) Take 1 tablet by mouth every morning. Patient not taking: Reported on 03/05/2023    [provider]  sevelamer carbonate (RENVELA) 800 MG tablet Take 800 mg by mouth 3 (three) times daily. Patient not taking: Reported on 03/05/2023 12/11/22   [provider]    Current Medications Scheduled Meds:  amLODipine  5 mg Oral Daily   carvedilol  12.5 mg Oral BID WC   cloNIDine  0.1 mg Oral BID   heparin  5,000 Units Subcutaneous Q8H   levETIRAcetam  1,000 mg Oral QPM   [START ON 03/07/2023] levETIRAcetam  1,000 mg Oral Q M,W,F-HD   lidocaine  1 patch Transdermal Q24H   naloxone  0.4 mg Nasal Once   pantoprazole  40 mg Oral Daily   senna-docusate  2 tablet Oral QHS  Continuous Infusions: PRN Meds:.acetaminophen **OR** acetaminophen, acetaminophen, hydrALAZINE  CBC Recent Labs  Lab 03/05/23 1622 03/06/23 0500 03/06/23 0836  WBC 7.0 16.6* 13.9*  NEUTROABS 4.7  --  11.5*  HGB 8.2* 7.6* 7.8*  HCT 28.5* 26.8* 27.7*  MCV 90.2 91.2 91.4  PLT 283 288 262   Basic Metabolic Panel Recent Labs  Lab 03/05/23 1622 03/06/23 0500  NA 131* 131*  K 3.2* 3.7  CL 97* 97*  CO2 27 27  GLUCOSE 101* 94  BUN 15 20  CREATININE 1.87* 2.72*  CALCIUM 7.6* 7.9*    Physical Exam  Blood pressure (!) 182/90, pulse 93, temperature 98.9 F (37.2 C), temperature source Oral, resp. rate 19, height 5\' 3"  (1.6 m), weight 44.9 kg, SpO2 93%. GEN: Chronically ill-appearing, NAD, breathing comfortably ENT: NCAT EYES: EOMI CV: Regular, no rub PULM: Diminished in the bases, normal work of breathing ABD: Soft, nontender SKIN: No  rashes or lesions, decubitus ulcers not examined VASC: LUE AVF +B/T  Assessment 23F ESRD MWF [facility] [address] with AHRF, bilateral pleural effusions, compressive atelectasis.  ESRD MWF, no missed treatments, using AVF LUE. Outpt records not currently available AHRF 2/2 bilateral pleural effusions with compressive atelectasis; CTA negative for PE.  No clear evidence of pneumonia.  Essentially this is volume overload Chronic anemia, recent outpatient management unclear Hypertension on at least 3 outpatient agents but also perhaps with IDH Chronic osteomyelitis with stage IV sacral decubitus ulcers  Plan Extra treatment today with goal UF of 3 L We might have to back off on her hypertensive regimen, ideally this is going to be done on an outpatient basis where they have a better understanding of her weights and pre-/post blood pressures She does need to lower her EDW  She does have enough residual renal function to have  decent UOP, try using torsemide 100 mg twice daily; outpatient furosemide dosing is listed at 20 mg daily, this will be an adequate for her low GFR Dialysis again tomorrow on usual schedule if here   [name]  03/06/2023, 9:03 AM

## 2023-03-06 NOTE — Care Management Obs Status (Signed)
MEDICARE OBSERVATION STATUS NOTIFICATION   Patient Details  Name: Adira Limburg MRN: [id number] Date of Birth: 05-29-1965   Medicare Observation Status Notification Given:  Yes    Corey Harold 03/06/2023, 11:37 AM
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
## 2023-03-06 NOTE — Progress Notes (Signed)
   HEMODIALYSIS TREATMENT NOTE:  3 hour extra treatment completed.  Goal met: 3 liters removed.  All blood was returned.  Post HD spO2 99% on RA and BP 151/74 but pt is still c/o dyspnea.  "I don't feel good."  CXR was ordered, pending at time of this writing.      03/06/23 1650  Vitals  Temp 98 F (36.7 C)  Temp Source Oral  BP (!) 151/74  MAP (mmHg) 96  BP Location Right Arm  BP Method Automatic  Patient Position (if appropriate) Sitting  Pulse Rate 73  Pulse Rate Source Monitor  Resp 18  Oxygen Therapy  SpO2 99 %  O2 Device Room Air  Hepatitis B Pre Treatment Patient Checks  Hepatitis B Surface Antigen Results Pending (labs drawn, awaiting results)  Date Hepatitis B Surface Antigen Drawn 03/06/23  Hep B Antibody Quant/Post still pending  Date Hep B Antibody Quant/Post Drawn 03/06/23  Patient's Immunity Status Pending (labs drawn, awaiting results)  Isolation Initiated Unknown Hepatitis status  Post Treatment  Dialyzer Clearance Lightly streaked  Hemodialysis Intake (mL) 0 mL  Liters Processed 53.3  Fluid Removed (mL) 3000 mL  Tolerated HD Treatment Yes  Post-Hemodialysis Comments Goal met  AVG/AVF Arterial Site Held (minutes) 5 minutes  AVG/AVF Venous Site Held (minutes) 5 minutes  Fistula / Graft Left Upper arm Arteriovenous fistula  No placement date or time found.   Placed prior to admission: Yes  Orientation: Left  Access Location: Upper arm  Access Type: Arteriovenous fistula  Fistula / Graft Assessment Thrill;Bruit;Aneurysm present  Status Patent    Arman Filter, RN [facility]

## 2023-03-06 NOTE — Plan of Care (Signed)
°  Problem: Fluid Volume: °Goal: Hemodynamic stability will improve °Outcome: Progressing °  °Problem: Clinical Measurements: °Goal: Diagnostic test results will improve °Outcome: Progressing °Goal: Signs and symptoms of infection will decrease °Outcome: Progressing °  °

## 2023-03-06 NOTE — Consult Note (Signed)
Pharmacy Antibiotic Note  [name] is a 57 y.o. female admitted on 03/05/2023 with  shortness of breath .  Patient has a history of end-stage renal disease and is currently on dialysis which she has been undergoing for about 4 or 5 years and her access is in the left upper extremity. Pharmacy has been consulted for Vancomycin dosing.  Patient prescribed vancomycin 500mg  after each HD session and it appears that she received 500mg  after HD 10/9 then an additional 1g on arrival to [facility].   Patient for HD today 10/10, will hold further vancomycin dosing.  Height: 5\' 3"  (160 cm) Weight: 44.9 kg (99 lb) IBW/kg (Calculated) : 52.4  Temp (24hrs), Avg:98.4 F (36.9 C), Min:97.8 F (36.6 C), Max:99.3 F (37.4 C)  Recent Labs  Lab 03/05/23 1622 03/06/23 0500 03/06/23 0836  WBC 7.0 16.6* 13.9*  CREATININE 1.87* 2.72*  --   LATICACIDVEN  --   --  0.6    Estimated Creatinine Clearance: 16.6 mL/min (A) (by C-G formula based on SCr of 2.72 mg/dL (H)).    Allergies  Allergen Reactions   Benadryl [Diphenhydramine Hcl (Sleep)] Hives   Daptomycin Hives   Linezolid     Other Reaction(s): GI Intolerance  Patient self-discontinued treatment due to GI intolerance. Taking it along with moxifloxacin   Moxifloxacin     Other Reaction(s): GI Intolerance  Patient self-discontinued treatment due to GI intolerance. Taking it along with linezolid   Quinine Derivatives Other (See Comments)    Alters mental status   Vancomycin     Pt is tolerating this medication at HD   Azithromycin Itching and Rash   Tetracycline Itching    Able to tolerate Doxycycline.    Zosyn [Piperacillin Sod-Tazobactam So] Rash    Antimicrobials this admission: Vancomycin 10/9 >>   Dose adjustments this admission: Hold dose post HD - 10/10  Microbiology results: No cultures currently ordered.  Thank you for allowing pharmacy to be a part of this patient's care.  Sheppard Coil PharmD., BCPS Clinical  Pharmacist 03/06/2023 12:31 PM

## 2023-03-07 LAB — HEPATITIS B SURFACE ANTIBODY, QUANTITATIVE: Hep B S AB Quant (Post): 410 m[IU]/mL

## 2023-03-17 ENCOUNTER — Observation Stay (HOSPITAL_COMMUNITY)
Admission: EM | Admit: 2023-03-17 | Discharge: 2023-03-18 | Disposition: A | Discharge status: Home / Self Care | Payer: 59 | Attending: Internal Medicine | Admitting: Internal Medicine

## 2023-03-17 ENCOUNTER — Emergency Department (HOSPITAL_COMMUNITY): Payer: 59

## 2023-03-17 ENCOUNTER — Other Ambulatory Visit: Payer: Self-pay

## 2023-03-17 ENCOUNTER — Encounter (HOSPITAL_COMMUNITY): Payer: Self-pay

## 2023-03-17 DIAGNOSIS — R0689 Other abnormalities of breathing: Secondary | ICD-10-CM | POA: Diagnosis not present

## 2023-03-17 DIAGNOSIS — Z9359 Other cystostomy status: Secondary | ICD-10-CM

## 2023-03-17 DIAGNOSIS — G8220 Paraplegia, unspecified: Secondary | ICD-10-CM | POA: Diagnosis present

## 2023-03-17 DIAGNOSIS — D631 Anemia in chronic kidney disease: Secondary | ICD-10-CM | POA: Diagnosis not present

## 2023-03-17 DIAGNOSIS — L8990 Pressure ulcer of unspecified site, unspecified stage: Secondary | ICD-10-CM | POA: Diagnosis present

## 2023-03-17 DIAGNOSIS — R0789 Other chest pain: Secondary | ICD-10-CM | POA: Insufficient documentation

## 2023-03-17 DIAGNOSIS — Z8542 Personal history of malignant neoplasm of other parts of uterus: Secondary | ICD-10-CM | POA: Diagnosis not present

## 2023-03-17 DIAGNOSIS — D649 Anemia, unspecified: Secondary | ICD-10-CM | POA: Diagnosis present

## 2023-03-17 DIAGNOSIS — G40909 Epilepsy, unspecified, not intractable, without status epilepticus: Secondary | ICD-10-CM

## 2023-03-17 DIAGNOSIS — R0602 Shortness of breath: Secondary | ICD-10-CM | POA: Diagnosis present

## 2023-03-17 DIAGNOSIS — R0902 Hypoxemia: Secondary | ICD-10-CM

## 2023-03-17 DIAGNOSIS — N186 End stage renal disease: Secondary | ICD-10-CM | POA: Diagnosis not present

## 2023-03-17 DIAGNOSIS — I120 Hypertensive chronic kidney disease with stage 5 chronic kidney disease or end stage renal disease: Secondary | ICD-10-CM | POA: Diagnosis not present

## 2023-03-17 DIAGNOSIS — Z992 Dependence on renal dialysis: Secondary | ICD-10-CM | POA: Diagnosis present

## 2023-03-17 DIAGNOSIS — I10 Essential (primary) hypertension: Secondary | ICD-10-CM | POA: Diagnosis present

## 2023-03-17 DIAGNOSIS — Z79899 Other long term (current) drug therapy: Secondary | ICD-10-CM | POA: Insufficient documentation

## 2023-03-17 DIAGNOSIS — R079 Chest pain, unspecified: Principal | ICD-10-CM

## 2023-03-17 DIAGNOSIS — K219 Gastro-esophageal reflux disease without esophagitis: Secondary | ICD-10-CM | POA: Diagnosis present

## 2023-03-17 DIAGNOSIS — L89309 Pressure ulcer of unspecified buttock, unspecified stage: Secondary | ICD-10-CM | POA: Diagnosis not present

## 2023-03-17 DIAGNOSIS — R0600 Dyspnea, unspecified: Principal | ICD-10-CM | POA: Diagnosis present

## 2023-03-17 HISTORY — DX: Disorder of kidney and ureter, unspecified: N28.9

## 2023-03-17 LAB — COMPREHENSIVE METABOLIC PANEL
ALT: 19 U/L (ref 0–44)
AST: 23 U/L (ref 15–41)
Albumin: 2.9 g/dL — ABNORMAL LOW (ref 3.5–5.0)
Alkaline Phosphatase: 106 U/L (ref 38–126)
Anion gap: 11 (ref 5–15)
BUN: 70 mg/dL — ABNORMAL HIGH (ref 6–20)
CO2: 23 mmol/L (ref 22–32)
Calcium: 8.5 mg/dL — ABNORMAL LOW (ref 8.9–10.3)
Chloride: 99 mmol/L (ref 98–111)
Creatinine, Ser: 5.57 mg/dL — ABNORMAL HIGH (ref 0.44–1.00)
GFR, Estimated: 8 mL/min — ABNORMAL LOW (ref >60)
Glucose, Bld: 99 mg/dL (ref 70–99)
Potassium: 4.9 mmol/L (ref 3.5–5.1)
Sodium: 133 mmol/L — ABNORMAL LOW (ref 135–145)
Total Bilirubin: 0.5 mg/dL (ref 0.3–1.2)
Total Protein: 8.4 g/dL — ABNORMAL HIGH (ref 6.5–8.1)

## 2023-03-17 LAB — CBC WITH DIFFERENTIAL/PLATELET
Abs Immature Granulocytes: 0.02 10*3/uL (ref 0.00–0.07)
Basophils Absolute: 0.1 10*3/uL (ref 0.0–0.1)
Basophils Relative: 1 %
Eosinophils Absolute: 0.2 10*3/uL (ref 0.0–0.5)
Eosinophils Relative: 3 %
HCT: 33.6 % — ABNORMAL LOW (ref 36.0–46.0)
Hemoglobin: 9.6 g/dL — ABNORMAL LOW (ref 12.0–15.0)
Immature Granulocytes: 0 %
Lymphocytes Relative: 24 %
Lymphs Abs: 1.2 10*3/uL (ref 0.7–4.0)
MCH: 26.8 pg (ref 26.0–34.0)
MCHC: 28.6 g/dL — ABNORMAL LOW (ref 30.0–36.0)
MCV: 93.9 fL (ref 80.0–100.0)
Monocytes Absolute: 0.3 10*3/uL (ref 0.1–1.0)
Monocytes Relative: 6 %
Neutro Abs: 3.4 10*3/uL (ref 1.7–7.7)
Neutrophils Relative %: 66 %
Platelets: 197 10*3/uL (ref 150–400)
RBC: 3.58 MIL/uL — ABNORMAL LOW (ref 3.87–5.11)
RDW: 22 % — ABNORMAL HIGH (ref 11.5–15.5)
WBC: 5.2 10*3/uL (ref 4.0–10.5)
nRBC: 0 % (ref 0.0–0.2)

## 2023-03-17 LAB — BLOOD GAS, VENOUS
Acid-base deficit: 0.2 mmol/L (ref 0.0–2.0)
Bicarbonate: 25.4 mmol/L (ref 20.0–28.0)
Drawn by: 44828
O2 Saturation: 64.8 %
Patient temperature: 36.4
pCO2, Ven: 43 mm[Hg] — ABNORMAL LOW (ref 44–60)
pH, Ven: 7.38 (ref 7.25–7.43)
pO2, Ven: 35 mm[Hg] (ref 32–45)

## 2023-03-17 LAB — CBG MONITORING, ED: Glucose-Capillary: 104 mg/dL — ABNORMAL HIGH (ref 70–99)

## 2023-03-17 LAB — TROPONIN I (HIGH SENSITIVITY)
Troponin I (High Sensitivity): 24 ng/L — ABNORMAL HIGH (ref <18)
Troponin I (High Sensitivity): 23 ng/L — ABNORMAL HIGH (ref <18)

## 2023-03-17 MED ORDER — ALBUTEROL SULFATE (2.5 MG/3ML) 0.083% IN NEBU: 3.0000 mL | INHALATION_SOLUTION | RESPIRATORY_TRACT | Status: DC | PRN

## 2023-03-17 MED ORDER — CLONIDINE HCL 0.1 MG PO TABS
0.1000 mg | ORAL_TABLET | Freq: Two times a day (BID) | ORAL | Status: DC
  Administered 2023-03-17 – 2023-03-18 (×3): 0.1 mg via ORAL
  Filled 2023-03-17 (×3): qty 1

## 2023-03-17 MED ORDER — NEPRO/CARBSTEADY PO LIQD
237.0000 mL | ORAL | Status: DC | PRN
Start: 2023-03-17 — End: 2023-03-18

## 2023-03-17 MED ORDER — LEVETIRACETAM 500 MG PO TABS: 500.0000 mg | ORAL_TABLET | ORAL | Status: DC

## 2023-03-17 MED ORDER — IRBESARTAN 150 MG PO TABS
150.0000 mg | ORAL_TABLET | Freq: Every day | ORAL | Status: DC
  Administered 2023-03-17: 150 mg via ORAL
  Filled 2023-03-17: qty 1

## 2023-03-17 MED ORDER — CHLORHEXIDINE GLUCONATE CLOTH 2 % EX PADS
6.0000 | MEDICATED_PAD | Freq: Every day | CUTANEOUS | Status: DC
  Administered 2023-03-18: 6 via TOPICAL

## 2023-03-17 MED ORDER — PENTAFLUOROPROP-TETRAFLUOROETH EX AERO: 1.0000 | INHALATION_SPRAY | CUTANEOUS | Status: DC | PRN

## 2023-03-17 MED ORDER — AMLODIPINE BESYLATE 5 MG PO TABS: 5.0000 mg | ORAL_TABLET | Freq: Every day | ORAL | Status: DC

## 2023-03-17 MED ORDER — CYCLOBENZAPRINE HCL 10 MG PO TABS: 10.0000 mg | ORAL_TABLET | Freq: Three times a day (TID) | ORAL | Status: DC | PRN

## 2023-03-17 MED ORDER — VANCOMYCIN HCL 500 MG/100ML IV SOLN: 500.0000 mg | INTRAVENOUS | Status: DC

## 2023-03-17 MED ORDER — ONDANSETRON HCL 4 MG/2ML IJ SOLN: 4.0000 mg | Freq: Four times a day (QID) | INTRAMUSCULAR | Status: DC | PRN

## 2023-03-17 MED ORDER — AMLODIPINE BESYLATE 5 MG PO TABS
5.0000 mg | ORAL_TABLET | ORAL | Status: DC
  Administered 2023-03-17: 5 mg via ORAL
  Filled 2023-03-17 (×2): qty 1

## 2023-03-17 MED ORDER — HYDRALAZINE HCL 20 MG/ML IJ SOLN: 10.0000 mg | INTRAMUSCULAR | Status: DC | PRN

## 2023-03-17 MED ORDER — AMLODIPINE BESYLATE 5 MG PO TABS
10.0000 mg | ORAL_TABLET | ORAL | Status: DC
  Administered 2023-03-18: 10 mg via ORAL
  Filled 2023-03-17: qty 2

## 2023-03-17 MED ORDER — PANTOPRAZOLE SODIUM 40 MG PO TBEC
40.0000 mg | DELAYED_RELEASE_TABLET | Freq: Every day | ORAL | Status: DC
  Administered 2023-03-17 – 2023-03-18 (×2): 40 mg via ORAL
  Filled 2023-03-17 (×2): qty 1

## 2023-03-17 MED ORDER — HEPARIN SODIUM (PORCINE) 5000 UNIT/ML IJ SOLN
5000.0000 [IU] | Freq: Three times a day (TID) | INTRAMUSCULAR | Status: DC
  Administered 2023-03-17 – 2023-03-18 (×2): 5000 [IU] via SUBCUTANEOUS
  Filled 2023-03-17 (×2): qty 1

## 2023-03-17 MED ORDER — CARVEDILOL 12.5 MG PO TABS
12.5000 mg | ORAL_TABLET | Freq: Two times a day (BID) | ORAL | Status: DC
  Administered 2023-03-17 – 2023-03-18 (×2): 12.5 mg via ORAL
  Filled 2023-03-17 (×2): qty 1

## 2023-03-17 MED ORDER — LIDOCAINE-PRILOCAINE 2.5-2.5 % EX CREA
1.0000 | TOPICAL_CREAM | CUTANEOUS | Status: DC
Start: 2023-03-19 — End: 2023-03-18

## 2023-03-17 MED ORDER — LEVETIRACETAM 500 MG PO TABS
1000.0000 mg | ORAL_TABLET | Freq: Every evening | ORAL | Status: DC
  Administered 2023-03-17: 1000 mg via ORAL
  Filled 2023-03-17: qty 2

## 2023-03-17 MED ORDER — LIDOCAINE HCL (PF) 1 % IJ SOLN: 5.0000 mL | INTRAMUSCULAR | Status: DC | PRN

## 2023-03-17 MED ORDER — ACETAMINOPHEN 500 MG PO TABS
1000.0000 mg | ORAL_TABLET | Freq: Four times a day (QID) | ORAL | Status: DC | PRN
  Administered 2023-03-18: 1000 mg via ORAL
  Filled 2023-03-17: qty 2

## 2023-03-17 MED ORDER — SEVELAMER CARBONATE 800 MG PO TABS
800.0000 mg | ORAL_TABLET | Freq: Three times a day (TID) | ORAL | Status: DC
  Administered 2023-03-17 – 2023-03-18 (×3): 800 mg via ORAL
  Filled 2023-03-17 (×3): qty 1

## 2023-03-17 MED ORDER — TORSEMIDE 20 MG PO TABS
100.0000 mg | ORAL_TABLET | Freq: Two times a day (BID) | ORAL | Status: DC
Start: 2023-03-17 — End: 2023-03-18
  Administered 2023-03-17 – 2023-03-18 (×2): 100 mg via ORAL
  Filled 2023-03-17 (×2): qty 5

## 2023-03-17 MED ORDER — LIDOCAINE-PRILOCAINE 2.5-2.5 % EX CREA: 1.0000 | TOPICAL_CREAM | CUTANEOUS | Status: DC | PRN

## 2023-03-17 MED ORDER — SENNOSIDES-DOCUSATE SODIUM 8.6-50 MG PO TABS
2.0000 | ORAL_TABLET | Freq: Every day | ORAL | Status: DC
  Administered 2023-03-17: 2 via ORAL
  Filled 2023-03-17: qty 2

## 2023-03-17 MED ORDER — LIDOCAINE-PRILOCAINE 2.5-2.5 % EX CREA
1.0000 | TOPICAL_CREAM | CUTANEOUS | Status: DC
Start: 2023-03-17 — End: 2023-03-17

## 2023-03-17 MED ORDER — ONDANSETRON HCL 4 MG PO TABS: 4.0000 mg | ORAL_TABLET | Freq: Four times a day (QID) | ORAL | Status: DC | PRN

## 2023-03-17 MED ORDER — LEVETIRACETAM 500 MG PO TABS: 1000.0000 mg | ORAL_TABLET | Freq: Every evening | ORAL | Status: DC
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
## 2023-03-17 NOTE — ED Provider Notes (Signed)
[name] Provider Note   CSN: [id number] Arrival date & time: 03/17/23  1324     History Chief Complaint  Patient presents with   Chest Pain    [name] is a 57 y.o. female with past medical history of paraplegia with suprapubic catheter, hypertension, seizure disorder, GERD, chronic pain, pressure ulcers, and osteomyelitis presents the emergency room today for evaluation of worsening shortness of breath with chest pain.  Patient reports using feeling shortness of breath overall for the past 2 to 3 months but has felt it worsening today.  Patient reports that she usually feels winded with getting dressed but today felt more winded with getting dressed.  Is having some left-sided chest pain but does not radiate.  Describes it as burning in nature.  No nausea or vomiting.  No cough.  She denies any heart palpitations.  She is was to go to dialysis today however given her symptoms she came to the emergency department.  She arrived via EMS which nursing reports her oxygen was "low" however it was not documented how low it was.  She was placed on 2 L.   Chest Pain Associated symptoms: shortness of breath   Associated symptoms: no abdominal pain, no cough, no fever, no nausea, no palpitations and no vomiting        Home Medications Prior to Admission medications   Medication Sig Start Date End Date Taking? Authorizing Provider  acetaminophen (TYLENOL) 500 MG tablet Take 1,000 mg by mouth every 6 (six) hours as needed for moderate pain.    [provider]  albuterol (VENTOLIN HFA) 108 (90 Base) MCG/ACT inhaler Inhale 2 puffs into the lungs every 4 (four) hours as needed for wheezing or shortness of breath. 11/12/22   Shon Hale, MD  amLODipine (NORVASC) 5 MG tablet Take 1 tablet (5 mg total) by mouth daily. Take 1 tablet on Monday,Wednesday and Friday then take 2 tablets on Tuesday,Thursday,Saturday and Sunday. Patient  taking differently: Take 5 mg by mouth See admin instructions. Take 1 tablet on Monday,Wednesday and Friday then take 2 tablets on Tuesday,Thursday,Saturday and Sunday. 11/12/22   Shon Hale, MD  B Complex-C-Folic Acid (RENA-VITE RX PO) Take 1 tablet by mouth every morning. Patient not taking: Reported on 03/05/2023    [provider]  carvedilol (COREG) 12.5 MG tablet Take 12.5 mg by mouth 2 (two) times daily with a meal. 12/24/22 03/24/23  [provider]  cloNIDine (CATAPRES) 0.1 MG tablet Take 1 tablet (0.1 mg total) by mouth 2 (two) times daily. 11/12/22   Shon Hale, MD  cyclobenzaprine (FLEXERIL) 10 MG tablet Take 10 mg by mouth 3 (three) times daily. 01/29/23   [provider]  irbesartan (AVAPRO) 150 MG tablet Take 150 mg by mouth daily at 6 PM.    [provider]  levETIRAcetam (KEPPRA) 500 MG tablet Take 1,000 mg by mouth every evening. Take an additional tablet on Monday,Wednesday and Friday evening after Dialysis 08/09/21   [provider]  lidocaine-prilocaine (EMLA) cream Apply 1 Application topically 3 (three) times a week. 06/04/22   [provider]  naloxone [facility]) nasal spray 4 mg/0.1 mL Place 0.4 mg into the nose once. 10/03/22   [provider]  pantoprazole (PROTONIX) 40 MG tablet Take 40 mg by mouth daily. 08/03/22   [provider]  senna-docusate (SENOKOT-S) 8.6-50 MG tablet Take 2 tablets by mouth at bedtime. 11/12/22   Shon Hale, MD  sevelamer carbonate (RENVELA)  800 MG tablet Take 800 mg by mouth 3 (three) times daily. Patient not taking: Reported on 03/05/2023 12/11/22   [provider]  torsemide (DEMADEX) 100 MG tablet Take 1 tablet (100 mg total) by mouth 2 (two) times daily. 03/06/23 04/05/23  Kendell Bane, MD  vancomycin (VANCOREADY) 500 MG/100ML IVPB Inject 100 mLs (500 mg total) into the vein every Monday, Wednesday, and Friday with hemodialysis. 12/18/22   Kendell Bane,  MD      Allergies    Benadryl [diphenhydramine hcl (sleep)], Daptomycin, Linezolid, Moxifloxacin, Quinine derivatives, Vancomycin, Azithromycin, Tetracycline, and Zosyn [piperacillin sod-tazobactam so]    Review of Systems   Review of Systems  Constitutional:  Negative for chills and fever.  HENT:  Negative for congestion and rhinorrhea.   Respiratory:  Positive for shortness of breath. Negative for cough.   Cardiovascular:  Positive for chest pain. Negative for palpitations and leg swelling.  Gastrointestinal:  Negative for abdominal pain, nausea and vomiting.  Neurological:  Negative for syncope.    Physical Exam Updated Vital Signs BP (!) 166/89 (BP Location: Right Arm)   Pulse 69   Temp (!) 97.5 F (36.4 C) (Oral)   Resp 16   Ht 5\' 2"  (1.575 m)   Wt 54.4 kg   LMP  (LMP Unknown)   SpO2 98%   BMI 21.95 kg/m  Physical Exam Vitals and nursing note reviewed.  Constitutional:      General: She is not in acute distress.    Comments: Chronically ill-appearing  Eyes:     General: No scleral icterus. Cardiovascular:     Rate and Rhythm: Normal rate.  Pulmonary:     Effort: Tachypnea present.     Breath sounds: Examination of the right-lower field reveals rales. Examination of the left-lower field reveals rales. Rales present.     Comments: Mild tachypnea present.  Patient is almost able to speak in full sentences without a stick of breath.  No tripoding.  No cyanosis or nasal flaring present.  She is breathing around 24 times per minute.  Rales auscultated in bilateral bases, left greater than right. Chest:     Chest wall: There is no dullness to percussion.  Skin:    General: Skin is warm and dry.  Neurological:     Comments: Loss of sensation and motor function in lower extremities at baseline     ED Results / Procedures / Treatments   Labs (all labs ordered are listed, but only abnormal results are displayed) Labs Reviewed  CBC WITH DIFFERENTIAL/PLATELET -  Abnormal; Notable for the following components:      Result Value   RBC 3.58 (*)    Hemoglobin 9.6 (*)    HCT 33.6 (*)    MCHC 28.6 (*)    RDW 22.0 (*)    All other components within normal limits  COMPREHENSIVE METABOLIC PANEL - Abnormal; Notable for the following components:   Sodium 133 (*)    BUN 70 (*)    Creatinine, Ser 5.57 (*)    Calcium 8.5 (*)    Total Protein 8.4 (*)    Albumin 2.9 (*)    GFR, Estimated 8 (*)    All other components within normal limits  BLOOD GAS, VENOUS - Abnormal; Notable for the following components:   pCO2, Ven 43 (*)    All other components within normal limits  TROPONIN I (HIGH SENSITIVITY) - Abnormal; Notable for the following components:   Troponin I (High Sensitivity) 24 (*)  All other components within normal limits  TROPONIN I (HIGH SENSITIVITY) - Abnormal; Notable for the following components:   Troponin I (High Sensitivity) 23 (*)    All other components within normal limits    EKG None  Radiology DG Chest 2 View  Result Date: 03/17/2023 CLINICAL DATA:  Chest pain over the last few days.  Dialysis patient EXAM: CHEST - 2 VIEW COMPARISON:  X-ray 03/06/2023 and older FINDINGS: Extensive Harrington rods along thoracic spine. Enlarged cardiopericardial silhouette. Films are rotated. Central vascular congestion with moderate pleural effusions. No pneumothorax. No frank edema. There is also some confluence opacity along both lung bases. The effusions in the opacities are increased from prior x-ray. Overlapping cardiac leads. IMPRESSION: Rotated radiographs. Increasing pleural effusions and opacities at the bases. Enlarged heart with increasing vascular congestion. Electronically Signed   By: Karen Kays M.D.   On: 03/17/2023 12:06    Procedures Procedures   Medications Ordered in ED Medications - No data to display  ED Course/ Medical Decision Making/ A&P Clinical Course as of 03/17/23 1800  Mon Mar 17, 2023  1233 Consulted  hospitalist team who will come and take a look at her. Awaiting call back from cardiology [RR]  1238 Spoke with [name], cardiology, does not see a need for intervention at this time and is umimpressed with the troponins given her paraplegia and ESRD on dialysis.  [RR]    Clinical Course User Index [RR] Achille Rich, PA-C   Medical Decision Making Amount and/or Complexity of Data Reviewed Labs: ordered. Radiology: ordered.  Risk Decision regarding hospitalization.    57 y.o. female presents to the ER for evaluation of shortness of breath. Differential diagnosis includes but is not limited to CHF, pericardial effusion/tamponade, arrhythmias, ACS, COPD, asthma, bronchitis, pneumonia, pneumothorax, PE, anemia, pleural effusions. Vital signs mildly elevated blood pressure, mildly tachypneic, satting well on 2 L nasal cannula.. Physical exam as noted above.   Unsure the patient's occupation was coming in but staff placed her on oxygen.  I drained the patient's oxygen off and let her go as low as 90 and turned to the oxygen back on.  Will obtain labs.  I independently reviewed and interpreted the patient's labs.  VBG shows pH is 7.38.  CMP shows mildly decreased sodium 133.  Creatinine elevated however patient dialysis patient.  Mildly decreased calcium.  Mildly elevated total protein.  Mildly decreased albumin.  Otherwise no electrolyte or LFT abnormality.  CBC shows anemia with a hemoglobin of 9.6 which appears to be improved from patient's baseline.  No leukocytosis.  Troponins are slightly elevated at 24 with repeat at 23.  Chest x-ray shows Rotated radiographs. Increasing pleural effusions and opacities at the bases. Enlarged heart with increasing vascular congestion.  Per radiologist read.  On reevaluation, now that she has been lying still and has not been moving around much or being transferred from stretcher to the bed, she does appear less tachypneic and has a normal respiratory rate.  I  do not think she needs BiPAP at this time.  She satting well on her 2 L of nasal cannula.  However her troponins are slightly elevated but flat, I did consult cardiology was not impressed and think is her typical findings with the patient on end-stage renal disease on dialysis.  I considered PE however patient did have that scan when I when she was here to few days prior that did not show any signs of a PE.  Patient reports this is the same  symptoms she had previously.  I think she is likely more volume up and could benefit from dialysis. Patient will need to be admitted for dialysis and re-evaluation of her SOB. Admit to hospitalist.   Portions of this report may have been transcribed using voice recognition software. Every effort was made to ensure accuracy; however, inadvertent computerized transcription errors may be present.   Final Clinical Impression(s) / ED Diagnoses Final diagnoses:  None    Rx / DC Orders ED Discharge Orders     None         Achille Rich, PA-C 03/17/23 2249    Pricilla Loveless, MD 03/20/23 [phone]

## 2023-03-17 NOTE — ED Notes (Addendum)
ED TO INPATIENT HANDOFF REPORT  ED Nurse Name and Phone #: Haze Justin [phone]  S Name/Age/Gender [name] 57 y.o. female Room/Bed: APA14/APA14  Code Status   Code Status: Prior  Home/SNF/Other Home Patient oriented to: self, place, time, and situation Is this baseline? Yes   Triage Complete: Triage complete  Chief Complaint Dyspnea and respiratory abnormalities [R06.00, R06.89]  Triage Note Pt brought her from home [facility]. EMS reported she has had chest pains for the past few days, but was more severe at about 0700 this morning. She is a dialysis patient and today is also her day for dialysis treatment.     Allergies Allergies  Allergen Reactions   Benadryl [Diphenhydramine Hcl (Sleep)] Hives   Daptomycin Hives   Linezolid     Other Reaction(s): GI Intolerance  Patient self-discontinued treatment due to GI intolerance. Taking it along with moxifloxacin   Moxifloxacin     Other Reaction(s): GI Intolerance  Patient self-discontinued treatment due to GI intolerance. Taking it along with linezolid   Quinine Derivatives Other (See Comments)    Alters mental status   Vancomycin     Pt is tolerating this medication at HD   Azithromycin Itching and Rash   Tetracycline Itching    Able to tolerate Doxycycline.    Zosyn [Piperacillin Sod-Tazobactam So] Rash    Level of Care/Admitting Diagnosis ED Disposition     ED Disposition  Admit   Condition  --   Comment  Hospital Area: [facility] [[id number]]  Level of Care: Telemetry [5]  Covid Evaluation: Asymptomatic - no recent exposure (last 10 days) testing not required  Diagnosis: Dyspnea and respiratory abnormalities [742728]  Admitting Physician: Erick Blinks [[id number]]  Attending Physician: Erick Blinks [[id number]]          B Medical/Surgery History Past Medical History:  Diagnosis Date   Abnormal uterine bleeding (AUB) 06/15/2014   Cancer (HCC)    uterine   High blood  pressure    Paraplegia (lower)    Renal disorder    Seizure disorder (HCC)    Seizures (HCC)    Suprapubic catheter (HCC)    Urinary tract infection    Past Surgical History:  Procedure Laterality Date   APPLICATION OF WOUND VAC Right 09/13/2021   (approximately 1-45mos ago) pressure sore on right hip   BACK SURGERY     Pt stated "before 2000"   BIOPSY  12/03/2022   Procedure: BIOPSY;  Surgeon: Dolores Frame, MD;  Location: [facility];  Service: Gastroenterology;;   ESOPHAGOGASTRODUODENOSCOPY N/A 09/20/2015   Procedure: ESOPHAGOGASTRODUODENOSCOPY (EGD);  Surgeon: Malissa Hippo, MD;  Location: [facility];  Service: Endoscopy;  Laterality: N/A;  730   ESOPHAGOGASTRODUODENOSCOPY (EGD) WITH PROPOFOL N/A 12/03/2022   Procedure: ESOPHAGOGASTRODUODENOSCOPY (EGD) WITH PROPOFOL;  Surgeon: Dolores Frame, MD;  Location: [facility];  Service: Gastroenterology;  Laterality: N/A;  1:15 pm, asa 3, pt knows to arrive at 10:30  dialysis pt, M,W & F   IR CATHETER TUBE CHANGE  04/02/2018   PERCUTANEOUS ENDOSCOPIC GASTROSTOMY (PEG) REMOVAL N/A 09/20/2015   Procedure: PERCUTANEOUS ENDOSCOPIC GASTROSTOMY (PEG) REMOVAL;  Surgeon: Malissa Hippo, MD;  Location: [facility];  Service: Endoscopy;  Laterality: N/A;   TEE WITHOUT CARDIOVERSION N/A 11/11/2022   Procedure: TRANSESOPHAGEAL ECHOCARDIOGRAM (TEE);  Surgeon: Pricilla Riffle, MD;  Location: [facility] ORS;  Service: Cardiovascular;  Laterality: N/A;     A IV Location/Drains/Wounds Patient Lines/Drains/Airways Status  Active Line/Drains/Airways     Name Placement date Placement time Site Days   Peripheral IV 03/17/23 20 G 1" Anterior;Right Forearm 03/17/23  1014  Forearm  less than 1   Fistula / Graft Left Upper arm Arteriovenous fistula --  --  Upper arm  --   Suprapubic Catheter Latex --  --  Latex  --   Pressure Injury 02/26/22 Ischial tuberosity Left Stage 4 - Full thickness tissue loss with exposed bone, tendon or  muscle. 02/26/22  0120  -- 384   Pressure Injury 02/26/22 Ischial tuberosity Right Stage 4 - Full thickness tissue loss with exposed bone, tendon or muscle. 02/26/22  0120  -- 384   Wound / Incision (Open or Dehisced) 07/24/22 Other (Comment) Ischial tuberosity Left Stage IV 2x2 cm with 1.5cm depth small amount of serous exudate 07/24/22  2239  Ischial tuberosity  236            Intake/Output Last 24 hours No intake or output data in the 24 hours ending 03/17/23 1251  Labs/Imaging Results for orders placed or performed during the hospital encounter of 03/17/23 (from the past 48 hour(s))  Troponin I (High Sensitivity)     Status: Abnormal   Collection Time: 03/17/23 10:02 AM  Result Value Ref Range   Troponin I (High Sensitivity) 24 (H) <18 ng/L    Comment: (NOTE) Elevated high sensitivity troponin I (hsTnI) values and significant  changes across serial measurements may suggest ACS but many other  chronic and acute conditions are known to elevate hsTnI results.  Refer to the "Links" section for chest pain algorithms and additional  guidance. Performed at [facility], [address], [place], [place] [address]   CBC with Differential     Status: Abnormal   Collection Time: 03/17/23 10:02 AM  Result Value Ref Range   WBC 5.2 4.0 - 10.5 K/uL   RBC 3.58 (L) 3.87 - 5.11 MIL/uL   Hemoglobin 9.6 (L) 12.0 - 15.0 g/dL   HCT 60.4 (L) 54.0 - 98.1 %   MCV 93.9 80.0 - 100.0 fL   MCH 26.8 26.0 - 34.0 pg   MCHC 28.6 (L) 30.0 - 36.0 g/dL   RDW 19.1 (H) 47.8 - 29.5 %   Platelets 197 150 - 400 K/uL   nRBC 0.0 0.0 - 0.2 %   Neutrophils Relative % 66 %   Neutro Abs 3.4 1.7 - 7.7 K/uL   Lymphocytes Relative 24 %   Lymphs Abs 1.2 0.7 - 4.0 K/uL   Monocytes Relative 6 %   Monocytes Absolute 0.3 0.1 - 1.0 K/uL   Eosinophils Relative 3 %   Eosinophils Absolute 0.2 0.0 - 0.5 K/uL   Basophils Relative 1 %   Basophils Absolute 0.1 0.0 - 0.1 K/uL   WBC Morphology MORPHOLOGY UNREMARKABLE    RBC  Morphology ANISOCYTOSIS    Smear Review MORPHOLOGY UNREMARKABLE    Immature Granulocytes 0 %   Abs Immature Granulocytes 0.02 0.00 - 0.07 K/uL    Comment: Performed at [facility], [address] [place], [place] [address]  Comprehensive metabolic panel     Status: Abnormal   Collection Time: 03/17/23 10:02 AM  Result Value Ref Range   Sodium 133 (L) 135 - 145 mmol/L   Potassium 4.9 3.5 - 5.1 mmol/L   Chloride 99 98 - 111 mmol/L   CO2 23 22 - 32 mmol/L   Glucose, Bld 99 70 - 99 mg/dL    Comment: Glucose reference range applies only  to samples taken after fasting for at least 8 hours.   BUN 70 (H) 6 - 20 mg/dL   Creatinine, Ser 2.59 (H) 0.44 - 1.00 mg/dL   Calcium 8.5 (L) 8.9 - 10.3 mg/dL   Total Protein 8.4 (H) 6.5 - 8.1 g/dL   Albumin 2.9 (L) 3.5 - 5.0 g/dL   AST 23 15 - 41 U/L   ALT 19 0 - 44 U/L   Alkaline Phosphatase 106 38 - 126 U/L   Total Bilirubin 0.5 0.3 - 1.2 mg/dL   GFR, Estimated 8 (L) >60 mL/min    Comment: (NOTE) Calculated using the CKD-EPI Creatinine Equation (2021)    Anion gap 11 5 - 15    Comment: Performed at [facility], [address] [place], [place] [address]  Blood gas, venous (at [facility] and [facility])     Status: Abnormal   Collection Time: 03/17/23 11:15 AM  Result Value Ref Range   pH, Ven 7.38 7.25 - 7.43   pCO2, Ven 43 (L) 44 - 60 mmHg   pO2, Ven 35 32 - 45 mmHg   Bicarbonate 25.4 20.0 - 28.0 mmol/L   Acid-base deficit 0.2 0.0 - 2.0 mmol/L   O2 Saturation 64.8 %   Patient temperature 36.4    Collection site RIGHT ANTECUBITAL    Drawn by [phone]     Comment: Performed at [facility], [address], [place], [place] [address]  Troponin I (High Sensitivity)     Status: Abnormal   Collection Time: 03/17/23 11:15 AM  Result Value Ref Range   Troponin I (High Sensitivity) 23 (H) <18 ng/L    Comment: (NOTE) Elevated high sensitivity troponin I (hsTnI) values and significant  changes across serial measurements may suggest ACS but many other  chronic and  acute conditions are known to elevate hsTnI results.  Refer to the "Links" section for chest pain algorithms and additional  guidance. Performed at [facility], [address], [place], [place] [address]   CBG monitoring, ED     Status: Abnormal   Collection Time: 03/17/23 12:29 PM  Result Value Ref Range   Glucose-Capillary 104 (H) 70 - 99 mg/dL    Comment: Glucose reference range applies only to samples taken after fasting for at least 8 hours.   DG Chest 2 View  Result Date: 03/17/2023 CLINICAL DATA:  Chest pain over the last few days.  Dialysis patient EXAM: CHEST - 2 VIEW COMPARISON:  X-ray 03/06/2023 and older FINDINGS: Extensive Harrington rods along thoracic spine. Enlarged cardiopericardial silhouette. Films are rotated. Central vascular congestion with moderate pleural effusions. No pneumothorax. No frank edema. There is also some confluence opacity along both lung bases. The effusions in the opacities are increased from prior x-ray. Overlapping cardiac leads. IMPRESSION: Rotated radiographs. Increasing pleural effusions and opacities at the bases. Enlarged heart with increasing vascular congestion. Electronically Signed   By: Karen Kays M.D.   On: 03/17/2023 12:06    Pending Labs Unresulted Labs (From admission, onward)    None       Vitals/Pain Today's Vitals   03/17/23 1115 03/17/23 1130 03/17/23 1145 03/17/23 1200  BP: (!) 158/86 (!) 151/79 (!) 155/84 (!) 166/89  Pulse: 71 67 68 69  Resp: 20 16 15 16   Temp:      TempSrc:      SpO2: 97% 98% 97% 98%  Weight:      Height:      PainSc:        Isolation Precautions  No active isolations  Medications Medications - No data to display  Mobility non-ambulatory       R Recommendations: See Admitting Provider Note  Report given to: [place]

## 2023-03-17 NOTE — Progress Notes (Signed)
   HEMODIALYSIS TREATMENT NOTE:  Uneventful 3 hour heparin-free treatment completed using left upper arm AVF (15g/antegrade). Goal met: 2.9 liters removed without interruption in UF.  Reported some cramping in her hands at the end of treatment. All blood was returned and hemostasis was achieved in 15 minutes.   Post-HD:  03/17/23 2140  Vitals  Temp 98 F (36.7 C)  Temp Source Oral  BP (!) 166/90  MAP (mmHg) 114  BP Location Right Arm  BP Method Automatic  Patient Position (if appropriate) Lying  Pulse Rate 77  Pulse Rate Source Monitor  ECG Heart Rate 80  Resp 20  During Treatment Monitoring  Blood Flow Rate (mL/min) 200 mL/min  Ultrafiltration Rate (mL/min) 0 mL/min  HD Safety Checks Performed Yes  Intra-Hemodialysis Comments Tx completed  Dialysis Fluid Bolus Normal Saline  Post Treatment  Dialyzer Clearance Lightly streaked  Hemodialysis Intake (mL) 0 mL  Liters Processed 65.4  Fluid Removed (mL) 2900 mL  Tolerated HD Treatment Yes  Post-Hemodialysis Comments Goal met  AVG/AVF Arterial Site Held (minutes) 5 minutes  AVG/AVF Venous Site Held (minutes) 5 minutes  Fistula / Graft Left Upper arm Arteriovenous fistula  No placement date or time found.   Placed prior to admission: Yes  Orientation: Left  Access Location: Upper arm  Access Type: Arteriovenous fistula  Fistula / Graft Assessment Thrill;Bruit  Status Patent    Arman Filter, RN [facility]

## 2023-03-17 NOTE — Progress Notes (Signed)
Pt brouht to [facility] ED via EMS c/o several day history of chest pains and worsening SOB.  She did not go to her HD session today.  In the ED BP 183/124, HR 78, SpO2 96% on 2L [place].  K 4.9, Na 133, Hgb 9.6.  CXR with increase pleural effusions and enlarged heart with increasing vascular congestion.   We were asked to provide HD and UF as tolerated while she remains in the hospital.

## 2023-03-17 NOTE — ED Triage Notes (Signed)
Pt brought her from home by [facility] EMS. EMS reported she has had chest pains for the past few days, but was more severe at about 0700 this morning. She is a dialysis patient and today is also her day for dialysis treatment.

## 2023-03-18 DIAGNOSIS — R0689 Other abnormalities of breathing: Secondary | ICD-10-CM | POA: Diagnosis not present

## 2023-03-18 DIAGNOSIS — R0600 Dyspnea, unspecified: Secondary | ICD-10-CM | POA: Diagnosis not present

## 2023-03-18 DIAGNOSIS — R0602 Shortness of breath: Secondary | ICD-10-CM | POA: Diagnosis not present

## 2023-03-18 LAB — CBC
HCT: 27.5 % — ABNORMAL LOW (ref 36.0–46.0)
Hemoglobin: 8.2 g/dL — ABNORMAL LOW (ref 12.0–15.0)
MCH: 27.5 pg (ref 26.0–34.0)
MCHC: 29.8 g/dL — ABNORMAL LOW (ref 30.0–36.0)
MCV: 92.3 fL (ref 80.0–100.0)
Platelets: 190 10*3/uL (ref 150–400)
RBC: 2.98 MIL/uL — ABNORMAL LOW (ref 3.87–5.11)
RDW: 21.4 % — ABNORMAL HIGH (ref 11.5–15.5)
WBC: 5.7 10*3/uL (ref 4.0–10.5)
nRBC: 0 % (ref 0.0–0.2)

## 2023-03-18 LAB — MAGNESIUM: Magnesium: 1.8 mg/dL (ref 1.7–2.4)

## 2023-03-18 LAB — BASIC METABOLIC PANEL
Anion gap: 9 (ref 5–15)
BUN: 31 mg/dL — ABNORMAL HIGH (ref 6–20)
CO2: 29 mmol/L (ref 22–32)
Calcium: 8 mg/dL — ABNORMAL LOW (ref 8.9–10.3)
Chloride: 93 mmol/L — ABNORMAL LOW (ref 98–111)
Creatinine, Ser: 3.29 mg/dL — ABNORMAL HIGH (ref 0.44–1.00)
GFR, Estimated: 16 mL/min — ABNORMAL LOW (ref >60)
Glucose, Bld: 112 mg/dL — ABNORMAL HIGH (ref 70–99)
Potassium: 3.4 mmol/L — ABNORMAL LOW (ref 3.5–5.1)
Sodium: 131 mmol/L — ABNORMAL LOW (ref 135–145)

## 2023-03-18 MED ORDER — AMLODIPINE BESYLATE 5 MG PO TABS: 5.0000 mg | ORAL_TABLET | Freq: Every day | ORAL | 0 refills | Status: DC

## 2023-03-18 NOTE — Care Management Obs Status (Signed)
MEDICARE OBSERVATION STATUS NOTIFICATION   Patient Details  Name: [name] MRN: [id number] Date of Birth: 1965/06/24   Medicare Observation Status Notification Given:  Yes    Corey Harold 03/18/2023, 1:24 PM

## 2023-03-18 NOTE — Plan of Care (Signed)
  Problem: Activity: Goal: Risk for activity intolerance will decrease Outcome: Progressing   Problem: Coping: Goal: Level of anxiety will decrease Outcome: Progressing   Problem: Pain Managment: Goal: General experience of comfort will improve Outcome: Progressing   

## 2023-03-18 NOTE — Discharge Summary (Signed)
Physician Discharge Summary  [name] WJX:[id number] DOB: January 13, 1966 DOA: 03/17/2023  PCP: Richarda Osmond, FNP  Admit date: 03/17/2023  Discharge date: 03/18/2023  Admitted From:Home  Disposition:  Home  Recommendations for Outpatient Follow-up:  Follow up with PCP in 1-2 weeks Continue on dialysis as routinely scheduled Continue other home medications as prior  Home Health: None  Equipment/Devices: None  Discharge Condition:Stable  CODE STATUS: Full  Diet recommendation: Renal diet  Brief/Interim Summary: Per HPI: [name] is a 57 y.o. female with medical history significant for paraplegia with suprapubic catheter, hypertension, seizure disorder, GERD, chronic pain, pressure ulcers, and osteomyelitis who presented to the ED with complaints of worsening shortness of breath as well as chest pain over the last few days.  She claims that much of this has been ongoing over the last couple months and she was recently admitted for similar issues with volume overload and discharged on 10/10 with adjustment to her diuretic to Demadex 100 mg twice daily.  She states that she last received hemodialysis on Friday of last week and is due for further dialysis today.  She denies any fevers, chills, cough, or any other symptoms.   Patient was admitted with dyspnea and atypical chest pain in the setting of volume overload and received a session of hemodialysis on 10/21 with resolution of her symptoms.  No other acute events or concerns noted throughout the course of the stay and she denies any further chest pain.  No further cause for concern and she is in stable condition for discharge from nephrology standpoint.  Discharge Diagnoses:  Principal Problem:   Dyspnea and respiratory abnormalities Active Problems:   Paraplegia (HCC)   Suprapubic catheter (HCC)   Decubitus ulcer of buttock   Seizure disorder (HCC)   Essential hypertension   Pressure ulcer   Anemia    GERD (gastroesophageal reflux disease)   ESRD (end stage renal disease) (HCC)  Principal discharge diagnosis: Dyspnea with atypical chest pain secondary to volume overload in the setting of ESRD on HD MWF.  Discharge Instructions  Discharge Instructions     Diet - low sodium heart healthy   Complete by: As directed    If the dressing is still on your incision site when you go home, remove it on the third day after your surgery date. Remove dressing if it begins to fall off, or if it is dirty or damaged before the third day.   Complete by: As directed    Increase activity slowly   Complete by: As directed       Allergies as of 03/18/2023       Reactions   Benadryl [diphenhydramine Hcl (sleep)] Hives   Daptomycin Hives   Linezolid    Other Reaction(s): GI Intolerance Patient self-discontinued treatment due to GI intolerance. Taking it along with moxifloxacin   Moxifloxacin    Other Reaction(s): GI Intolerance Patient self-discontinued treatment due to GI intolerance. Taking it along with linezolid   Quinine Derivatives Other (See Comments)   Alters mental status   Vancomycin    Pt is tolerating this medication at HD   Azithromycin Itching, Rash   Tetracycline Itching   Able to tolerate Doxycycline.    Zosyn [piperacillin Sod-tazobactam So] Rash        Medication List     STOP taking these medications    vancomycin 500 MG/100ML IVPB Commonly known as: VANCOREADY       TAKE these medications    acetaminophen 500 MG tablet Commonly  known as: TYLENOL Take 1,000 mg by mouth every 6 (six) hours as needed for moderate pain.   albuterol 108 (90 Base) MCG/ACT inhaler Commonly known as: VENTOLIN HFA Inhale 2 puffs into the lungs every 4 (four) hours as needed for wheezing or shortness of breath.   amLODipine 5 MG tablet Commonly known as: NORVASC Take 1 tablet (5 mg total) by mouth daily. Take 1 tablet on Monday,Wednesday and Friday then take 2 tablets on  Tuesday,Thursday,Saturday and Sunday. What changed: when to take this   amLODipine 5 MG tablet Commonly known as: NORVASC Take 1 tablet (5 mg total) by mouth daily. Take 1 tablet on Monday,Wednesday and Friday then take 2 tablets on Tuesday,Thursday,Saturday and Sunday. What changed: You were already taking a medication with the same name, and this prescription was added. Make sure you understand how and when to take each.   carvedilol 12.5 MG tablet Commonly known as: COREG Take 12.5 mg by mouth 2 (two) times daily with a meal.   cloNIDine 0.1 MG tablet Commonly known as: CATAPRES Take 1 tablet (0.1 mg total) by mouth 2 (two) times daily.   cyclobenzaprine 10 MG tablet Commonly known as: FLEXERIL Take 10 mg by mouth 3 (three) times daily.   irbesartan 150 MG tablet Commonly known as: AVAPRO Take 150 mg by mouth daily at 6 PM.   levETIRAcetam 500 MG tablet Commonly known as: KEPPRA Take 1,000 mg by mouth every evening. Take an additional tablet on Monday,Wednesday and Friday evening after Dialysis   lidocaine-prilocaine cream Commonly known as: EMLA Apply 1 Application topically 3 (three) times a week.   naloxone 4 MG/0.1ML Liqd nasal spray kit Commonly known as: NARCAN Place 0.4 mg into the nose once.   pantoprazole 40 MG tablet Commonly known as: PROTONIX Take 40 mg by mouth daily.   senna-docusate 8.6-50 MG tablet Commonly known as: Senokot-S Take 2 tablets by mouth at bedtime.   sevelamer carbonate 800 MG tablet Commonly known as: RENVELA Take 800 mg by mouth 3 (three) times daily.   torsemide 100 MG tablet Commonly known as: DEMADEX Take 1 tablet (100 mg total) by mouth 2 (two) times daily.               Discharge Care Instructions  (From admission, onward)           Start     Ordered   03/18/23 0000  If the dressing is still on your incision site when you go home, remove it on the third day after your surgery date. Remove dressing if it begins  to fall off, or if it is dirty or damaged before the third day.        10 /22/24 0940            Follow-up Information     [place], [name], FNP. Schedule an appointment as soon as possible for a visit in 1 week(s).   Specialty: Internal Medicine Contact information: [phone] [facility] [place] [address] [phone]                Allergies  Allergen Reactions   Benadryl [Diphenhydramine Hcl (Sleep)] Hives   Daptomycin Hives   Linezolid     Other Reaction(s): GI Intolerance  Patient self-discontinued treatment due to GI intolerance. Taking it along with moxifloxacin   Moxifloxacin     Other Reaction(s): GI Intolerance  Patient self-discontinued treatment due to GI intolerance. Taking it along with linezolid  Quinine Derivatives Other (See Comments)    Alters mental status   Vancomycin     Pt is tolerating this medication at HD   Azithromycin Itching and Rash   Tetracycline Itching    Able to tolerate Doxycycline.    Zosyn [Piperacillin Sod-Tazobactam So] Rash    Consultations: None   Procedures/Studies: DG Chest 2 View  Result Date: 03/17/2023 CLINICAL DATA:  Chest pain over the last few days.  Dialysis patient EXAM: CHEST - 2 VIEW COMPARISON:  X-ray 03/06/2023 and older FINDINGS: Extensive Harrington rods along thoracic spine. Enlarged cardiopericardial silhouette. Films are rotated. Central vascular congestion with moderate pleural effusions. No pneumothorax. No frank edema. There is also some confluence opacity along both lung bases. The effusions in the opacities are increased from prior x-ray. Overlapping cardiac leads. IMPRESSION: Rotated radiographs. Increasing pleural effusions and opacities at the bases. Enlarged heart with increasing vascular congestion. Electronically Signed   By: Karen Kays M.D.   On: 03/17/2023 12:06   DG CHEST PORT 1 VIEW  Result Date: 03/06/2023 CLINICAL DATA:  Shortness of breath  EXAM: PORTABLE CHEST 1 VIEW COMPARISON:  03/05/2023 FINDINGS: Thoracic Arrington rods noted. Continued blunting of both costophrenic angles with retrocardiac airspace opacity on the left. Absent central third of the right clavicle. Atherosclerotic calcification of the aortic arch. The patient is rotated to the left on today's radiograph, reducing diagnostic sensitivity and specificity. Borderline enlargement of the cardiopericardial silhouette IMPRESSION: 1. Continued blunting of both costophrenic angles with retrocardiac airspace opacity on the left. 2. Borderline enlargement of the cardiopericardial silhouette. 3. Absent central third of the right clavicle. Electronically Signed   By: Gaylyn Rong M.D.   On: 03/06/2023 21:00   CT Angio Chest PE W and/or Wo Contrast  Result Date: 03/05/2023 CLINICAL DATA:  Pulmonary embolism (PE) suspected, high prob. Increasing shortness of breath with exertion. Nausea. EXAM: CT ANGIOGRAPHY CHEST WITH CONTRAST TECHNIQUE: Multidetector CT imaging of the chest was performed using the standard protocol during bolus administration of intravenous contrast. Multiplanar CT image reconstructions and MIPs were obtained to evaluate the vascular anatomy. RADIATION DOSE REDUCTION: This exam was performed according to the departmental dose-optimization program which includes automated exposure control, adjustment of the mA and/or kV according to patient size and/or use of iterative reconstruction technique. CONTRAST:  75mL OMNIPAQUE IOHEXOL 350 MG/ML SOLN COMPARISON:  CT scan chest from 12/13/2022. FINDINGS: Cardiovascular: No evidence of embolism to the proximal subsegmental pulmonary artery level. Mild cardiomegaly. Stable mild pericardial effusion. No aortic aneurysm. Mediastinum/Nodes: Visualized thyroid gland appears grossly unremarkable. No solid / cystic mediastinal masses. The esophagus is nondistended precluding optimal assessment. No axillary, mediastinal or hilar  lymphadenopathy by size criteria. Lungs/Pleura: The central tracheo-bronchial tree is patent. There are bilateral small-to-moderate pleural effusions, right more than left which also extend into the bilateral major fissures. There are resultant compressive atelectatic changes in the bilateral lower lobes. No mass or consolidation. No pneumothorax. No suspicious lung nodules. Upper Abdomen: Visualized upper abdominal viscera within normal limits. Musculoskeletal: The visualized soft tissues of the chest wall are grossly unremarkable. No suspicious osseous lesions. There are mild to moderate multilevel degenerative changes in the visualized spine. Posterior spinal fixation of thoracic spine noted. There is old fracture deformity of sternum. Review of the MIP images confirms the above findings. IMPRESSION: 1. No evidence of pulmonary embolism to the proximal subsegmental pulmonary artery level. 2. Small to moderate bilateral pleural effusions, right more than left with resultant compressive atelectatic changes in the bilateral lower  lobes. 3. Stable cardiomegaly and mild pericardial effusion. 4. Multiple other nonacute observations, as described above. Electronically Signed   By: Jules Schick M.D.   On: 03/05/2023 18:24   DG Chest Port 1 View  Result Date: 03/05/2023 CLINICAL DATA:  Shortness of breath. EXAM: PORTABLE CHEST 1 VIEW COMPARISON:  11/08/2022. FINDINGS: There are bilateral small pleural effusions with probable associated compressive atelectatic changes in the bilateral lower lobes. Bilateral lung fields are otherwise clear. No dense consolidation or lung collapse. Stable cardio-mediastinal silhouette. No acute osseous abnormalities. The soft tissues are within normal limits. IMPRESSION: 1. Bilateral small pleural effusions with probable associated compressive atelectatic changes in the bilateral lower lobes. Electronically Signed   By: Jules Schick M.D.   On: 03/05/2023 18:11     Discharge  Exam: Vitals:   03/17/23 2219 03/18/23 0441  BP: (!) 152/90 139/74  Pulse: 80 73  Resp: 20 20  Temp: 97.7 F (36.5 C) 98.5 F (36.9 C)  SpO2: 100% 92%   Vitals:   03/17/23 2130 03/17/23 2140 03/17/23 2219 03/18/23 0441  BP: (!) 162/82 (!) 166/90 (!) 152/90 139/74  Pulse:  77 80 73  Resp: 18 20 20 20   Temp:  98 F (36.7 C) 97.7 F (36.5 C) 98.5 F (36.9 C)  TempSrc:  Oral    SpO2:  100% 100% 92%  Weight:  51.4 kg    Height:        General: Pt is alert, awake, not in acute distress Cardiovascular: RRR, S1/S2 +, no rubs, no gallops Respiratory: CTA bilaterally, no wheezing, no rhonchi Abdominal: Soft, NT, ND, bowel sounds +, suprapubic catheter C/D/I Extremities: no edema, no cyanosis Ischial tuberosity wounds C/D/I    The results of significant diagnostics from this hospitalization (including imaging, microbiology, ancillary and laboratory) are listed below for reference.     Microbiology: No results found for this or any previous visit (from the past 240 hour(s)).   Labs: BNP (last 3 results) Recent Labs    03/05/23 1622  BNP 2,139.0*   Basic Metabolic Panel: Recent Labs  Lab 03/17/23 1002 03/18/23 0404  NA 133* 131*  K 4.9 3.4*  CL 99 93*  CO2 23 29  GLUCOSE 99 112*  BUN 70* 31*  CREATININE 5.57* 3.29*  CALCIUM 8.5* 8.0*  MG  --  1.8   Liver Function Tests: Recent Labs  Lab 03/17/23 1002  AST 23  ALT 19  ALKPHOS 106  BILITOT 0.5  PROT 8.4*  ALBUMIN 2.9*   No results for input(s): "LIPASE", "AMYLASE" in the last 168 hours. No results for input(s): "AMMONIA" in the last 168 hours. CBC: Recent Labs  Lab 03/17/23 1002 03/18/23 0404  WBC 5.2 5.7  NEUTROABS 3.4  --   HGB 9.6* 8.2*  HCT 33.6* 27.5*  MCV 93.9 92.3  PLT 197 190   Cardiac Enzymes: No results for input(s): "CKTOTAL", "CKMB", "CKMBINDEX", "TROPONINI" in the last 168 hours. BNP: Invalid input(s): "POCBNP" CBG: Recent Labs  Lab 03/17/23 1229  GLUCAP 104*    D-Dimer No results for input(s): "DDIMER" in the last 72 hours. Hgb A1c No results for input(s): "HGBA1C" in the last 72 hours. Lipid Profile No results for input(s): "CHOL", "HDL", "LDLCALC", "TRIG", "CHOLHDL", "LDLDIRECT" in the last 72 hours. Thyroid function studies No results for input(s): "TSH", "T4TOTAL", "T3FREE", "THYROIDAB" in the last 72 hours.  Invalid input(s): "FREET3" Anemia work up No results for input(s): "VITAMINB12", "FOLATE", "FERRITIN", "TIBC", "IRON", "RETICCTPCT" in the last 72 hours. Urinalysis  Component Value Date/Time   COLORURINE YELLOW 12/13/2022 2115   APPEARANCEUR CLOUDY (A) 12/13/2022 2115   LABSPEC 1.009 12/13/2022 2115   PHURINE 8.0 12/13/2022 2115   GLUCOSEU 150 (A) 12/13/2022 2115   HGBUR SMALL (A) 12/13/2022 2115   BILIRUBINUR NEGATIVE 12/13/2022 2115   KETONESUR NEGATIVE 12/13/2022 2115   PROTEINUR >=300 (A) 12/13/2022 2115   UROBILINOGEN 0.2 03/31/2015 1200   NITRITE NEGATIVE 12/13/2022 2115   LEUKOCYTESUR TRACE (A) 12/13/2022 2115   Sepsis Labs Recent Labs  Lab 03/17/23 1002 03/18/23 0404  WBC 5.2 5.7   Microbiology No results found for this or any previous visit (from the past 240 hour(s)).   Time coordinating discharge: 35 minutes  SIGNED:   Erick Blinks, DO [facility] 03/18/2023, 9:54 AM  If 7PM-7AM, please contact night-coverage [email or web address]

## 2023-03-18 NOTE — TOC CM/SW Note (Signed)
Transition of Care [facility]) - Inpatient Brief Assessment   Patient Details  Name: [name] MRN: [id number] Date of Birth: [birth year]  [facility] of Care [facility]) CM/SW Contact:    Villa Herb, LCSWA Phone Number: 03/18/2023, 10:42 AM   Clinical Narrative: Transition of Care Department [facility]) has reviewed patient and no TOC needs have been identified at this time. We will continue to monitor patient advancement through interdisciplinary progression rounds. If new patient transition needs arise, please place a TOC consult.  Transition of Care Asessment: Insurance and Status: Insurance coverage has been reviewed Patient has primary care physician: Yes Home environment has been reviewed: Home with family Prior level of function:: Needs assistance Prior/Current Home Services: No current home services Social Determinants of Health Reivew: SDOH reviewed no interventions necessary Readmission risk has been reviewed: Yes Transition of care needs: no transition of care needs at this time

## 2023-03-20 ENCOUNTER — Telehealth: Payer: Self-pay

## 2023-03-20 NOTE — Transitions of Care (Post Inpatient/ED Visit) (Signed)
03/20/2023  Name: [name] MRN: [id number] DOB: 07/26/1965  Today's TOC FU Call Status: Today's TOC FU Call Status:: Successful TOC FU Call Completed TOC FU Call Complete Date: 03/20/23 Patient's Name and Date of Birth confirmed.  Transition Care Management Follow-up Telephone Call Date of Discharge: 03/19/23 [facility] Type of Discharge: Inpatient Admission Primary Inpatient Discharge Diagnosis:: dysnea How have you been since you were released from the hospital?: Better Any questions or concerns?: No  Items Reviewed: Did you receive and understand the discharge instructions provided?: Yes Medications obtained,verified, and reconciled?: Yes (Medications Reviewed) Any new allergies since your discharge?: No Dietary orders reviewed?: Yes Do you have support at home?: Yes People in Home: child(ren), adult  Medications Reviewed Today: Medications Reviewed Today   Medications were not reviewed in this encounter     Home Care and Equipment/Supplies: Were Home Health Services Ordered?: NA Any new equipment or medical supplies ordered?: NA  Functional Questionnaire: Do you need assistance with bathing/showering or dressing?: No Do you need assistance with meal preparation?: No Do you need assistance with eating?: No Do you have difficulty maintaining continence: No Do you need assistance with getting out of bed/getting out of a chair/moving?: No Do you have difficulty managing or taking your medications?: No  Follow up appointments reviewed: PCP Follow-up appointment confirmed?: No (patient transferred to [facility]) MD Provider Line Number:[phone] Given: No Specialist Hospital Follow-up appointment confirmed?: NA Do you need transportation to your follow-up appointment?: No Do you understand care options if your condition(s) worsen?: Yes-patient verbalized understanding    SIGNATURE Karena Addison, LPN [facility] Nurse Health  Advisor Direct Dial [phone]

## 2023-03-31 ENCOUNTER — Encounter (HOSPITAL_COMMUNITY): Payer: Self-pay

## 2023-03-31 ENCOUNTER — Other Ambulatory Visit: Payer: Self-pay

## 2023-03-31 ENCOUNTER — Emergency Department (HOSPITAL_COMMUNITY): Payer: 59

## 2023-03-31 ENCOUNTER — Observation Stay (HOSPITAL_COMMUNITY)
Admission: EM | Admit: 2023-03-31 | Discharge: 2023-04-01 | Disposition: A | Discharge status: Home / Self Care | Payer: 59 | Attending: Internal Medicine | Admitting: Internal Medicine

## 2023-03-31 DIAGNOSIS — G8929 Other chronic pain: Secondary | ICD-10-CM | POA: Insufficient documentation

## 2023-03-31 DIAGNOSIS — Z79899 Other long term (current) drug therapy: Secondary | ICD-10-CM | POA: Insufficient documentation

## 2023-03-31 DIAGNOSIS — Z8542 Personal history of malignant neoplasm of other parts of uterus: Secondary | ICD-10-CM | POA: Diagnosis not present

## 2023-03-31 DIAGNOSIS — J9601 Acute respiratory failure with hypoxia: Principal | ICD-10-CM | POA: Diagnosis present

## 2023-03-31 DIAGNOSIS — J810 Acute pulmonary edema: Principal | ICD-10-CM | POA: Insufficient documentation

## 2023-03-31 DIAGNOSIS — J9691 Respiratory failure, unspecified with hypoxia: Secondary | ICD-10-CM | POA: Diagnosis present

## 2023-03-31 DIAGNOSIS — I120 Hypertensive chronic kidney disease with stage 5 chronic kidney disease or end stage renal disease: Secondary | ICD-10-CM | POA: Insufficient documentation

## 2023-03-31 DIAGNOSIS — G8220 Paraplegia, unspecified: Secondary | ICD-10-CM | POA: Diagnosis not present

## 2023-03-31 DIAGNOSIS — G40909 Epilepsy, unspecified, not intractable, without status epilepticus: Secondary | ICD-10-CM

## 2023-03-31 DIAGNOSIS — Z9359 Other cystostomy status: Secondary | ICD-10-CM

## 2023-03-31 DIAGNOSIS — D631 Anemia in chronic kidney disease: Secondary | ICD-10-CM | POA: Diagnosis not present

## 2023-03-31 DIAGNOSIS — N186 End stage renal disease: Secondary | ICD-10-CM | POA: Diagnosis not present

## 2023-03-31 DIAGNOSIS — R0789 Other chest pain: Secondary | ICD-10-CM | POA: Diagnosis present

## 2023-03-31 DIAGNOSIS — Z992 Dependence on renal dialysis: Secondary | ICD-10-CM | POA: Diagnosis present

## 2023-03-31 DIAGNOSIS — M4628 Osteomyelitis of vertebra, sacral and sacrococcygeal region: Secondary | ICD-10-CM | POA: Insufficient documentation

## 2023-03-31 LAB — CBC WITH DIFFERENTIAL/PLATELET
Abs Immature Granulocytes: 0.02 10*3/uL (ref 0.00–0.07)
Basophils Absolute: 0.1 10*3/uL (ref 0.0–0.1)
Basophils Relative: 1 %
Eosinophils Absolute: 0.3 10*3/uL (ref 0.0–0.5)
Eosinophils Relative: 5 %
HCT: 32.2 % — ABNORMAL LOW (ref 36.0–46.0)
Hemoglobin: 9.3 g/dL — ABNORMAL LOW (ref 12.0–15.0)
Immature Granulocytes: 0 %
Lymphocytes Relative: 18 %
Lymphs Abs: 1.1 10*3/uL (ref 0.7–4.0)
MCH: 27.7 pg (ref 26.0–34.0)
MCHC: 28.9 g/dL — ABNORMAL LOW (ref 30.0–36.0)
MCV: 95.8 fL (ref 80.0–100.0)
Monocytes Absolute: 0.3 10*3/uL (ref 0.1–1.0)
Monocytes Relative: 5 %
Neutro Abs: 4.3 10*3/uL (ref 1.7–7.7)
Neutrophils Relative %: 71 %
Platelets: 169 10*3/uL (ref 150–400)
RBC: 3.36 MIL/uL — ABNORMAL LOW (ref 3.87–5.11)
RDW: 21.3 % — ABNORMAL HIGH (ref 11.5–15.5)
Smear Review: NORMAL
WBC: 6.1 10*3/uL (ref 4.0–10.5)
nRBC: 0 % (ref 0.0–0.2)

## 2023-03-31 LAB — BASIC METABOLIC PANEL
Anion gap: 15 (ref 5–15)
BUN: 67 mg/dL — ABNORMAL HIGH (ref 6–20)
CO2: 21 mmol/L — ABNORMAL LOW (ref 22–32)
Calcium: 8.8 mg/dL — ABNORMAL LOW (ref 8.9–10.3)
Chloride: 99 mmol/L (ref 98–111)
Creatinine, Ser: 5.55 mg/dL — ABNORMAL HIGH (ref 0.44–1.00)
GFR, Estimated: 9 mL/min — ABNORMAL LOW (ref >60)
Glucose, Bld: 104 mg/dL — ABNORMAL HIGH (ref 70–99)
Potassium: 5.3 mmol/L — ABNORMAL HIGH (ref 3.5–5.1)
Sodium: 135 mmol/L (ref 135–145)

## 2023-03-31 LAB — TROPONIN I (HIGH SENSITIVITY)
Troponin I (High Sensitivity): 27 ng/L — ABNORMAL HIGH (ref <18)
Troponin I (High Sensitivity): 22 ng/L — ABNORMAL HIGH (ref <18)

## 2023-03-31 LAB — HEPATITIS B SURFACE ANTIGEN: Hepatitis B Surface Ag: NONREACTIVE

## 2023-03-31 MED ORDER — HEPARIN SODIUM (PORCINE) 1000 UNIT/ML DIALYSIS: 1000.0000 [IU] | INTRAMUSCULAR | Status: DC | PRN

## 2023-03-31 MED ORDER — LIDOCAINE-PRILOCAINE 2.5-2.5 % EX CREA: 1.0000 | TOPICAL_CREAM | CUTANEOUS | Status: DC | PRN

## 2023-03-31 MED ORDER — IPRATROPIUM-ALBUTEROL 0.5-2.5 (3) MG/3ML IN SOLN
3.0000 mL | Freq: Once | RESPIRATORY_TRACT | Status: AC
  Administered 2023-03-31: 3 mL via RESPIRATORY_TRACT
  Filled 2023-03-31: qty 3

## 2023-03-31 MED ORDER — ACETAMINOPHEN 325 MG PO TABS
650.0000 mg | ORAL_TABLET | Freq: Four times a day (QID) | ORAL | Status: DC | PRN
  Administered 2023-03-31: 650 mg via ORAL
  Filled 2023-03-31: qty 2

## 2023-03-31 MED ORDER — CHLORHEXIDINE GLUCONATE CLOTH 2 % EX PADS
6.0000 | MEDICATED_PAD | Freq: Every day | CUTANEOUS | Status: DC
  Administered 2023-04-01: 6 via TOPICAL

## 2023-03-31 MED ORDER — LIDOCAINE HCL (PF) 1 % IJ SOLN: 5.0000 mL | INTRAMUSCULAR | Status: DC | PRN

## 2023-03-31 MED ORDER — ANTICOAGULANT SODIUM CITRATE 4% (200MG/5ML) IV SOLN: 5.0000 mL | Status: DC | PRN

## 2023-03-31 MED ORDER — ALTEPLASE 2 MG IJ SOLR: 2.0000 mg | Freq: Once | INTRAMUSCULAR | Status: DC | PRN

## 2023-03-31 MED ORDER — PENTAFLUOROPROP-TETRAFLUOROETH EX AERO: 1.0000 | INHALATION_SPRAY | CUTANEOUS | Status: DC | PRN

## 2023-03-31 MED ORDER — NEPRO/CARBSTEADY PO LIQD: 237.0000 mL | ORAL | Status: DC | PRN

## 2023-03-31 NOTE — ED Notes (Signed)
Dinner tray ordered ETA of 8pm

## 2023-03-31 NOTE — Progress Notes (Deleted)
I have reviewed the HD regimen and made appropriate changes.  Vinson Moselle MD  [facility] 03/31/2023, 3:14 PM

## 2023-03-31 NOTE — Progress Notes (Signed)
Received patient in ED stretcher bed.Awake,alert and oriented x 4. She is sob at rest.She signed her HD treatment consent. She BLE contracture.  Access used :Left upper arm AVF that worked well.  Duration of treatment: 3 hours.  UF goal: met  uf goal 2.5 liters  Hemo comment:Tolerated treatment.  Hand off to the patient nurse Back into her E.D cn table with stable medical condition via transporter.

## 2023-03-31 NOTE — ED Triage Notes (Signed)
Pt arrives via ems to the er for the c/o sob, chest pain. Pt is due for dialysis today. 89% RA 02, placed on 4/L by fire new 02 98%. Ems gave 0.4nitrog sublingual,  324 asp. Initial BP 200/100, last one 156/95. Hr on arrival 16. Pt states chest pain is left sided, doesn't radiate 7/10.

## 2023-03-31 NOTE — Consult Note (Signed)
Renal Service Consult Note [facility]  [name] 03/31/2023 Christina Krabbe, MD Requesting Physician: Dr. Vivia Ewing  Reason for Consult: ESRD pt w/ pulm edema, chest pain HPI: The patient is a 57 y.o. year-old w/ hx of hypertension, paraplegia, seizure disorder, suprapubic catheter, chronic pain and ESRD on HD as below who presented to ED for chest pain and SOB. Initial SpO2 was 89% on RA, fire placed 4 L [place] and improved to 98%. Initial BP was 200/100, improved to 156/95. Got SL ntg and aspirin. Trop 24 and 23. Hb 8.2, K+ 3.4 , creat 3.2. CXR showed bilat pulm edema at 10:34 am. Pt to be admitted. We are asked to see for dialysis.   Pt seen in ED. Last BP 189/97, HR 80, RR 22. Not in distress. C/o chest pain, wants some water to drink. No fevers, no prod cough, no LE edema.   Chart history:  Oct 10 - at [facility], HD 3h , 3L for SOB  July 24 - at [facility], 3h HD, only 500 cc UF due to symptomatic low BP's  June 17 - at [facility], 3h HD, 2 L removed     ROS - no joint pain, no HA, no blurry vision, no rash, no diarrhea, no nausea/ vomiting   Past Medical History  Past Medical History:  Diagnosis Date   Abnormal uterine bleeding (AUB) 06/15/2014   Cancer (HCC)    uterine   High blood pressure    Paraplegia (lower)    Renal disorder    Seizure disorder (HCC)    Seizures (HCC)    Suprapubic catheter (HCC)    Urinary tract infection    Past Surgical History  Past Surgical History:  Procedure Laterality Date   APPLICATION OF WOUND VAC Right 09/13/2021   (approximately 1-75mos ago) pressure sore on right hip   BACK SURGERY     Pt stated "before 2000"   BIOPSY  12/03/2022   Procedure: BIOPSY;  Surgeon: Dolores Frame, MD;  Location: [facility];  Service: Gastroenterology;;   ESOPHAGOGASTRODUODENOSCOPY N/A 09/20/2015   Procedure: ESOPHAGOGASTRODUODENOSCOPY (EGD);  Surgeon: Malissa Hippo, MD;  Location: [facility];  Service: Endoscopy;  Laterality: N/A;  730    ESOPHAGOGASTRODUODENOSCOPY (EGD) WITH PROPOFOL N/A 12/03/2022   Procedure: ESOPHAGOGASTRODUODENOSCOPY (EGD) WITH PROPOFOL;  Surgeon: Dolores Frame, MD;  Location: [facility];  Service: Gastroenterology;  Laterality: N/A;  1:15 pm, asa 3, pt knows to arrive at 10:30  dialysis pt, M,W & F   IR CATHETER TUBE CHANGE  04/02/2018   PERCUTANEOUS ENDOSCOPIC GASTROSTOMY (PEG) REMOVAL N/A 09/20/2015   Procedure: PERCUTANEOUS ENDOSCOPIC GASTROSTOMY (PEG) REMOVAL;  Surgeon: Malissa Hippo, MD;  Location: [facility];  Service: Endoscopy;  Laterality: N/A;   TEE WITHOUT CARDIOVERSION N/A 11/11/2022   Procedure: TRANSESOPHAGEAL ECHOCARDIOGRAM (TEE);  Surgeon: Pricilla Riffle, MD;  Location: [facility];  Service: Cardiovascular;  Laterality: N/A;   Family History  Family History  Problem Relation Age of Onset   Cancer Mother    Hypertension Mother    Cancer Sister        breast and then spread everywhere.   Diabetes Paternal Grandmother    Hypertension Paternal Grandmother    Social History  reports that she has never smoked. She has never used smokeless tobacco. She reports current alcohol use. She reports that she does not use drugs. Allergies  Allergies  Allergen Reactions   Benadryl [Diphenhydramine Hcl (Sleep)] Hives   Daptomycin Hives  Linezolid     Other Reaction(s): GI Intolerance  Patient self-discontinued treatment due to GI intolerance. Taking it along with moxifloxacin   Moxifloxacin     Other Reaction(s): GI Intolerance  Patient self-discontinued treatment due to GI intolerance. Taking it along with linezolid   Quinine Derivatives Other (See Comments)    Alters mental status   Vancomycin     Pt is tolerating this medication at HD   Azithromycin Itching and Rash   Tetracycline Itching    Able to tolerate Doxycycline.    Zosyn [Piperacillin Sod-Tazobactam So] Rash   Home medications Prior to Admission medications   Medication Sig Start Date End Date Taking?  Authorizing Provider  amLODipine (NORVASC) 5 MG tablet Take 1 tablet (5 mg total) by mouth daily. Take 1 tablet on Monday,Wednesday and Friday then take 2 tablets on Tuesday,Thursday,Saturday and Sunday. Patient taking differently: Take 5 mg by mouth See admin instructions. Take 1 tablet on Monday,Wednesday and Friday then take 2 tablets on Tuesday,Thursday,Saturday and Sunday. 11/12/22  Yes Shon Hale, MD  acetaminophen (TYLENOL) 500 MG tablet Take 1,000 mg by mouth every 6 (six) hours as needed for moderate pain.    [provider]  albuterol (VENTOLIN HFA) 108 (90 Base) MCG/ACT inhaler Inhale 2 puffs into the lungs every 4 (four) hours as needed for wheezing or shortness of breath. Patient not taking: Reported on 03/17/2023 11/12/22   Shon Hale, MD  amLODipine (NORVASC) 5 MG tablet Take 1 tablet (5 mg total) by mouth daily. Take 1 tablet on Monday,Wednesday and Friday then take 2 tablets on Tuesday,Thursday,Saturday and Sunday. 03/18/23   Sherryll Burger, Pratik D, DO  carvedilol (COREG) 12.5 MG tablet Take 12.5 mg by mouth 2 (two) times daily with a meal. 12/24/22 03/24/23  [provider]  cloNIDine (CATAPRES) 0.1 MG tablet Take 1 tablet (0.1 mg total) by mouth 2 (two) times daily. 11/12/22   Shon Hale, MD  cyclobenzaprine (FLEXERIL) 10 MG tablet Take 10 mg by mouth 3 (three) times daily. 01/29/23   [provider]  irbesartan (AVAPRO) 150 MG tablet Take 150 mg by mouth daily at 6 PM.    [provider]  levETIRAcetam (KEPPRA) 500 MG tablet Take 1,000 mg by mouth every evening. Take an additional tablet on Monday,Wednesday and Friday evening after Dialysis 08/09/21   [provider]  lidocaine-prilocaine (EMLA) cream Apply 1 Application topically 3 (three) times a week. 06/04/22   [provider]  naloxone [facility]) nasal spray 4 mg/0.1 mL Place 0.4 mg into the nose once. 10/03/22   [provider]  pantoprazole (PROTONIX) 40 MG tablet  Take 40 mg by mouth daily. 08/03/22   [provider]  senna-docusate (SENOKOT-S) 8.6-50 MG tablet Take 2 tablets by mouth at bedtime. 11/12/22   Shon Hale, MD  sevelamer carbonate (RENVELA) 800 MG tablet Take 800 mg by mouth 3 (three) times daily. Patient not taking: Reported on 03/05/2023 12/11/22   [provider]  torsemide (DEMADEX) 100 MG tablet Take 1 tablet (100 mg total) by mouth 2 (two) times daily. 03/06/23 04/05/23  Kendell Bane, MD     Vitals:   03/31/23 0849 03/31/23 0851  BP: (!) 162/92   Pulse: 82   Resp: (!) 24   Temp:  98 F (36.7 C)  TempSrc:  Oral  SpO2: 96%    Exam Gen alert, no distress, on RA, no distress Small framed, contractures LE's No rash, cyanosis or gangrene Sclera anicteric, throat clear  No jvd  or bruits Chest rales R base 1/3 up, L cta  RRR no RG Abd soft ntnd no mass or ascites +bs GU defer MS legs are contracted, no edema Neuro is alert, Ox 3 , nf    LUA AVF+bruit    Renal-related home meds: - norvasc 5mg  mwf, 10mg  non hd days - coreg 12.5 bid - clonidine 0.1 bid - irbesartan    OP HD: MWF [facility]  3h   45.5kg   400/500   3K/2.5Ca bath   LUA AVF  heparin none  - rocaltrol 0.75 mcg  - IDPN - mircera 175 mcg q2, due this week    Labs are pending   Assessment/ Plan: Pulm edema - initially borderline hypoxic and now is on RA. Possibly due to uncont HTN vs ischemic (+CP). Not in distress. Will plan on HD today.  ESKD - on HD MWF. HD today.  HTN - uncont w/ 200/100 initially. Better now 165/ 95.  Volume - not edematous on exam. Get bed wt when admitted. UF goal 2.75L.  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 Inpatient medications:

## 2023-03-31 NOTE — ED Provider Notes (Signed)
[place] EMERGENCY DEPARTMENT AT [facility] Provider Note   CSN: [id number] Arrival date & time: 03/31/23  0840     History  Chief Complaint  Patient presents with   Shortness of Breath   Chest Pain    [name] is a 57 y.o. female.  HPI    57 year old female comes with chief complaint of chest pain, shortness of breath. Patient has history of paraplegia, pressure ulcers, hypertension, renal failure on dialysis.  She denies any coronary artery disease, CHF.  Patient states that she started having chest pain earlier today.  Chest pain is described as left-sided, tightness and pressure pain that is fairly constant.  She has associated shortness of breath and felt clammy as well.  She is supposed to be due for dialysis today.  She has no history of similar symptoms in the past.  Patient has had some chest discomfort in the past with volume overload.  Per EMS, patient had O2 sats of 89% on room air.  They put her on 4 L of oxygen.  Patient did receive nitro glycerin and full dose aspirin.  Patient states that her symptoms after the EMS medications have improved but not resolved.  Home Medications Prior to Admission medications   Medication Sig Start Date End Date Taking? Authorizing Provider  amLODipine (NORVASC) 5 MG tablet Take 1 tablet (5 mg total) by mouth daily. Take 1 tablet on Monday,Wednesday and Friday then take 2 tablets on Tuesday,Thursday,Saturday and Sunday. Patient taking differently: Take 5 mg by mouth See admin instructions. Take 1 tablet on Monday,Wednesday and Friday then take 2 tablets on Tuesday,Thursday,Saturday and Sunday. 11/12/22  Yes Shon Hale, MD  acetaminophen (TYLENOL) 500 MG tablet Take 1,000 mg by mouth every 6 (six) hours as needed for moderate pain.    [provider]  albuterol (VENTOLIN HFA) 108 (90 Base) MCG/ACT inhaler Inhale 2 puffs into the lungs every 4 (four) hours as needed for wheezing or shortness of  breath. Patient not taking: Reported on 03/17/2023 11/12/22   Shon Hale, MD  amLODipine (NORVASC) 5 MG tablet Take 1 tablet (5 mg total) by mouth daily. Take 1 tablet on Monday,Wednesday and Friday then take 2 tablets on Tuesday,Thursday,Saturday and Sunday. 03/18/23   Sherryll Burger, Pratik D, DO  carvedilol (COREG) 12.5 MG tablet Take 12.5 mg by mouth 2 (two) times daily with a meal. 12/24/22 03/24/23  [provider]  cloNIDine (CATAPRES) 0.1 MG tablet Take 1 tablet (0.1 mg total) by mouth 2 (two) times daily. 11/12/22   Shon Hale, MD  cyclobenzaprine (FLEXERIL) 10 MG tablet Take 10 mg by mouth 3 (three) times daily. 01/29/23   [provider]  irbesartan (AVAPRO) 150 MG tablet Take 150 mg by mouth daily at 6 PM.    [provider]  levETIRAcetam (KEPPRA) 500 MG tablet Take 1,000 mg by mouth every evening. Take an additional tablet on Monday,Wednesday and Friday evening after Dialysis 08/09/21   [provider]  lidocaine-prilocaine (EMLA) cream Apply 1 Application topically 3 (three) times a week. 06/04/22   [provider]  naloxone [facility]) nasal spray 4 mg/0.1 mL Place 0.4 mg into the nose once. 10/03/22   [provider]  pantoprazole (PROTONIX) 40 MG tablet Take 40 mg by mouth daily. 08/03/22   [provider]  senna-docusate (SENOKOT-S) 8.6-50 MG tablet Take 2 tablets by mouth at bedtime. 11/12/22   Shon Hale, MD  sevelamer carbonate (RENVELA) 800 MG tablet Take 800 mg by mouth 3 (  three) times daily. Patient not taking: Reported on 03/05/2023 12/11/22   [provider]  torsemide (DEMADEX) 100 MG tablet Take 1 tablet (100 mg total) by mouth 2 (two) times daily. 03/06/23 04/05/23  Kendell Bane, MD      Allergies    Benadryl [diphenhydramine hcl (sleep)], Daptomycin, Linezolid, Moxifloxacin, Quinine derivatives, Vancomycin, Azithromycin, Tetracycline, and Zosyn [piperacillin sod-tazobactam so]    Review of Systems    Review of Systems  All other systems reviewed and are negative.   Physical Exam Updated Vital Signs BP (!) 162/92   Pulse 82   Temp 98 F (36.7 C) (Oral)   Resp (!) 24   LMP  (LMP Unknown)   SpO2 96%  Physical Exam Vitals and nursing note reviewed.  Constitutional:      Appearance: She is well-developed.  HENT:     Head: Atraumatic.  Neck:     Vascular: JVD present.  Cardiovascular:     Rate and Rhythm: Normal rate.  Pulmonary:     Effort: Pulmonary effort is normal.     Breath sounds: Examination of the right-middle field reveals rales. Examination of the left-middle field reveals rales. Examination of the right-lower field reveals rales. Examination of the left-lower field reveals rales. Rales present.  Musculoskeletal:     Cervical back: Normal range of motion and neck supple.     Right lower leg: No edema.     Left lower leg: No edema.  Skin:    General: Skin is warm and dry.  Neurological:     Mental Status: She is alert and oriented to person, place, and time.     ED Results / Procedures / Treatments   Labs (all labs ordered are listed, but only abnormal results are displayed) Labs Reviewed  BASIC METABOLIC PANEL  CBC WITH DIFFERENTIAL/PLATELET  TROPONIN I (HIGH SENSITIVITY)  TROPONIN I (HIGH SENSITIVITY)    EKG EKG Interpretation Date/Time:  Monday March 31 2023 08:49:07 EST Ventricular Rate:  83 PR Interval:  145 QRS Duration:  108 QT Interval:  432 QTC Calculation: 508 R Axis:   -76  Text Interpretation: Sinus rhythm Left atrial enlargement Incomplete RBBB and LAFB Right ventricular hypertrophy Borderline prolonged QT interval No acute changes No significant change since last tracing Confirmed by Derwood Kaplan [phone]) on 03/31/2023 9:36:34 AM  Radiology No results found.  Procedures .Critical Care  Performed by: Derwood Kaplan, MD Authorized by: Derwood Kaplan, MD   Critical care provider statement:    Critical care time (minutes):   41   Critical care was necessary to treat or prevent imminent or life-threatening deterioration of the following conditions:  Renal failure, respiratory failure and circulatory failure   Critical care was time spent personally by me on the following activities:  Development of treatment plan with patient or surrogate, discussions with consultants, evaluation of patient's response to treatment, examination of patient, ordering and review of laboratory studies, ordering and review of radiographic studies, ordering and performing treatments and interventions, pulse oximetry, re-evaluation of patient's condition, review of old charts and obtaining history from patient or surrogate     Medications Ordered in ED Medications - No data to display  ED Course/ Medical Decision Making/ A&P                                 Medical Decision Making Amount and/or Complexity of Data Reviewed Labs: ordered. Radiology: ordered.  Risk Decision regarding  hospitalization.   This patient presents to the ED with chief complaint(s) of chest pain, shortness of breath with pertinent past medical history of hypertension, paraplegia, ESRD on hemodialysis.patient had 2 admissions in October for dyspnea and chest pain.  Both times she was found volume overloaded and required dialysis . the complaint involves an extensive differential diagnosis and also carries with it a high risk of complications and morbidity.    The differential diagnosis considered for this patient includes  ACS syndrome Volume overload due to dialysis/CKD history Valvular disorder Pericardial effusion / tamponade Pneumonia Pleural effusion / Pulmonary edema PE Pneumothorax Musculoskeletal pain PUD / Gastritis / Esophagitis Esophageal spasm  The initial plan is to get basic labs and chest x-ray.  EKG does not show acute MI. Clinically she appears volume overloaded.  Her systolic was in the 200 range per EMS, may be them some hypertensive  emergency.  Additional history obtained: Additional history obtained from EMS  Records reviewed previous admission documents and previous CT scans.  She had a CT PE in October which was negative.  No repeat CT PE needed .  Independent labs interpretation:  The following labs were independently interpreted:   Independent visualization and interpretation of imaging: - I independently visualized the following imaging with scope of interpretation limited to determining acute life threatening conditions related to emergency care: X-ray of the chest, which revealed findings concerning for pulmonary edema.  I will consult nephrology for emergent dialysis.   Consultation: - Consulted or discussed management/test interpretation with external professional: Nephrology spoke with Dr. Chales Abrahams at 12:10 pm.  I requested hemodialysis.  He will assess the patient.  Labs are still pending.   Final Clinical Impression(s) / ED Diagnoses Final diagnoses:  Acute pulmonary edema (HCC)  ESRD (end stage renal disease) on dialysis [facility])    Rx / DC Orders ED Discharge Orders     None         Derwood Kaplan, MD 03/31/23 1227

## 2023-03-31 NOTE — ED Notes (Signed)
Assumed care of patient, NAD noted at this time, pt resting in bed, respirations even and unlabored, skin warm and dry, bed in low position, call light within reach. Comfort measures offered. Will continue to monitor. SR noted on monitor.

## 2023-03-31 NOTE — ED Provider Notes (Signed)
[place] EMERGENCY DEPARTMENT AT [facility] Provider Note   CSN: [id number] Arrival date & time: 03/31/23  0840     History  Chief Complaint  Patient presents with   Shortness of Breath   Chest Pain   HPI [name] is a 57 y.o. female with history of ESRD, paraplegic, hypertension, seizures presenting status post hemodialysis. Was initially seen here for shortness of breath and hypoxia.  Please see note from earlier today for that encounter. Patient states that this time that chest pain has resolved.  States that shortness of breath has improved somewhat but still present.   Shortness of Breath Associated symptoms: chest pain   Chest Pain Associated symptoms: shortness of breath        Home Medications Prior to Admission medications   Medication Sig Start Date End Date Taking? Authorizing Provider  acetaminophen (TYLENOL) 500 MG tablet Take 1,000 mg by mouth every 6 (six) hours as needed for moderate pain.   Yes [provider]  albuterol (VENTOLIN HFA) 108 (90 Base) MCG/ACT inhaler Inhale 2 puffs into the lungs every 4 (four) hours as needed for wheezing or shortness of breath. 11/12/22  Yes Emokpae, Courage, MD  amLODipine (NORVASC) 5 MG tablet Take 1 tablet (5 mg total) by mouth daily. Take 1 tablet on Monday,Wednesday and Friday then take 2 tablets on Tuesday,Thursday,Saturday and Sunday. 03/18/23  Yes Sherryll Burger, Pratik D, DO  carvedilol (COREG) 12.5 MG tablet Take 12.5 mg by mouth 2 (two) times daily with a meal. 12/24/22 03/31/23 Yes [provider]  cloNIDine (CATAPRES) 0.1 MG tablet Take 1 tablet (0.1 mg total) by mouth 2 (two) times daily. 11/12/22  Yes Emokpae, Courage, MD  cyclobenzaprine (FLEXERIL) 10 MG tablet Take 10 mg by mouth 3 (three) times daily. 01/29/23  Yes [provider]  irbesartan (AVAPRO) 150 MG tablet Take 150 mg by mouth daily at 6 PM.   Yes [provider]  levETIRAcetam (KEPPRA) 500 MG tablet Take 1,000  mg by mouth every evening. Take an additional tablet on Monday,Wednesday and Friday evening after Dialysis 08/09/21  Yes [provider]  lidocaine-prilocaine (EMLA) cream Apply 1 Application topically 3 (three) times a week. 06/04/22  Yes [provider]  naloxone (NARCAN) nasal spray 4 mg/0.1 mL Place 0.4 mg into the nose once. 10/03/22  Yes [provider]  pantoprazole (PROTONIX) 40 MG tablet Take 40 mg by mouth daily. 08/03/22  Yes [provider]  senna-docusate (SENOKOT-S) 8.6-50 MG tablet Take 2 tablets by mouth at bedtime. 11/12/22  Yes Emokpae, Courage, MD  sevelamer carbonate (RENVELA) 800 MG tablet Take 800 mg by mouth 3 (three) times daily. 12/11/22  Yes [provider]  torsemide (DEMADEX) 100 MG tablet Take 1 tablet (100 mg total) by mouth 2 (two) times daily. 03/06/23 04/05/23 Yes Shahmehdi, Gemma Payor, MD      Allergies    Benadryl [diphenhydramine hcl (sleep)], Daptomycin, Linezolid, Moxifloxacin, Quinine derivatives, Vancomycin, Azithromycin, Tetracycline, and Zosyn [piperacillin sod-tazobactam so]    Review of Systems   Review of Systems  Respiratory:  Positive for shortness of breath.   Cardiovascular:  Positive for chest pain.    Physical Exam Updated Vital Signs BP (!) 141/80 (BP Location: Right Arm)   Pulse 80   Temp 97.6 F (36.4 C) (Oral)   Resp 17   Wt 52.6 kg   LMP  (LMP Unknown)   SpO2 95%   BMI 21.21 kg/m  Physical Exam  ED Results / Procedures /  Treatments   Labs (all labs ordered are listed, but only abnormal results are displayed) Labs Reviewed  BASIC METABOLIC PANEL - Abnormal; Notable for the following components:      Result Value   Potassium 5.3 (*)    CO2 21 (*)    Glucose, Bld 104 (*)    BUN 67 (*)    Creatinine, Ser 5.55 (*)    Calcium 8.8 (*)    GFR, Estimated 9 (*)    All other components within normal limits  CBC WITH DIFFERENTIAL/PLATELET - Abnormal; Notable for the following components:   RBC  3.36 (*)    Hemoglobin 9.3 (*)    HCT 32.2 (*)    MCHC 28.9 (*)    RDW 21.3 (*)    All other components within normal limits  BASIC METABOLIC PANEL - Abnormal; Notable for the following components:   Sodium 132 (*)    Chloride 97 (*)    BUN 34 (*)    Creatinine, Ser 3.54 (*)    Calcium 8.5 (*)    GFR, Estimated 15 (*)    All other components within normal limits  CBC - Abnormal; Notable for the following components:   RBC 3.49 (*)    Hemoglobin 9.6 (*)    HCT 32.6 (*)    MCHC 29.4 (*)    RDW 20.9 (*)    All other components within normal limits  GLUCOSE, CAPILLARY - Abnormal; Notable for the following components:   Glucose-Capillary 111 (*)    All other components within normal limits  TROPONIN I (HIGH SENSITIVITY) - Abnormal; Notable for the following components:   Troponin I (High Sensitivity) 27 (*)    All other components within normal limits  TROPONIN I (HIGH SENSITIVITY) - Abnormal; Notable for the following components:   Troponin I (High Sensitivity) 22 (*)    All other components within normal limits  HEPATITIS B SURFACE ANTIGEN  PHOSPHORUS  HEPATITIS B SURFACE ANTIBODY, QUANTITATIVE  RENAL FUNCTION PANEL    EKG EKG Interpretation Date/Time:  Monday March 31 2023 08:49:07 EST Ventricular Rate:  83 PR Interval:  145 QRS Duration:  108 QT Interval:  432 QTC Calculation: 508 R Axis:   -76  Text Interpretation: Sinus rhythm Left atrial enlargement Incomplete RBBB and LAFB Right ventricular hypertrophy Borderline prolonged QT interval No acute changes No significant change since last tracing Confirmed by Derwood Kaplan ([id number]) on 03/31/2023 9:36:34 AM  Radiology ECHOCARDIOGRAM COMPLETE  Result Date: 04/01/2023    ECHOCARDIOGRAM REPORT   Patient Name:   [name] Date of Exam: 04/01/2023 Medical Rec #:  [id number]        Height:       62.0 in Accession #:    [id number]       Weight:       116.0 lb Date of Birth:  1966/02/10         BSA:          1.516 m  Patient Age:    57 years         BP:           169/94 mmHg Patient Gender: F                HR:           79 bpm. Exam Location:  Inpatient Procedure: 2D Echo, Cardiac Doppler and Color Doppler Indications:    Abnormal ECG  History:        Patient has prior history of  Echocardiogram examinations, most                 recent 11/09/2022. ESRD; Risk Factors:Hypertension and                 Non-Smoker.  Sonographer:    Dondra Prader RVT RCS Referring Phys: Lorin Glass IMPRESSIONS  1. Left ventricular ejection fraction, by estimation, is 45 to 50%. The left ventricle has mildly decreased function. The left ventricle demonstrates global hypokinesis. There is mild concentric left ventricular hypertrophy. Left ventricular diastolic parameters are consistent with Grade II diastolic dysfunction (pseudonormalization).  2. Right ventricular systolic function is mildly reduced. The right ventricular size is mildly enlarged. There is normal pulmonary artery systolic pressure. The estimated right ventricular systolic pressure is 28.2 mmHg.  3. Left atrial size was mildly dilated.  4. Right atrial size was mildly dilated.  5. The mitral valve is grossly normal. Mild mitral valve regurgitation. No evidence of mitral stenosis.  6. The aortic valve is tricuspid. Aortic valve regurgitation is not visualized. No aortic stenosis is present.  7. The inferior vena cava is normal in size with greater than 50% respiratory variability, suggesting right atrial pressure of 3 mmHg. Comparison(s): No significant change from prior study. FINDINGS  Left Ventricle: Left ventricular ejection fraction, by estimation, is 45 to 50%. The left ventricle has mildly decreased function. The left ventricle demonstrates global hypokinesis. The left ventricular internal cavity size was normal in size. There is  mild concentric left ventricular hypertrophy. Left ventricular diastolic parameters are consistent with Grade II diastolic dysfunction  (pseudonormalization). Right Ventricle: The right ventricular size is mildly enlarged. No increase in right ventricular wall thickness. Right ventricular systolic function is mildly reduced. There is normal pulmonary artery systolic pressure. The tricuspid regurgitant velocity  is 2.51 m/s, and with an assumed right atrial pressure of 3 mmHg, the estimated right ventricular systolic pressure is 28.2 mmHg. Left Atrium: Left atrial size was mildly dilated. Right Atrium: Right atrial size was mildly dilated. Pericardium: Trivial pericardial effusion is present. Mitral Valve: The mitral valve is grossly normal. Mild mitral valve regurgitation. No evidence of mitral valve stenosis. Tricuspid Valve: The tricuspid valve is grossly normal. Tricuspid valve regurgitation is mild . No evidence of tricuspid stenosis. Aortic Valve: The aortic valve is tricuspid. Aortic valve regurgitation is not visualized. No aortic stenosis is present. Aortic valve mean gradient measures 3.0 mmHg. Aortic valve peak gradient measures 5.5 mmHg. Aortic valve area, by VTI measures 3.29 cm. Pulmonic Valve: The pulmonic valve was grossly normal. Pulmonic valve regurgitation is not visualized. No evidence of pulmonic stenosis. Aorta: The aortic root and ascending aorta are structurally normal, with no evidence of dilitation. Venous: The inferior vena cava is normal in size with greater than 50% respiratory variability, suggesting right atrial pressure of 3 mmHg. IAS/Shunts: The atrial septum is grossly normal.  LEFT VENTRICLE PLAX 2D LVIDd:         5.20 cm   Diastology LVIDs:         3.40 cm   LV e' medial:    3.38 cm/s LV PW:         1.10 cm   LV E/e' medial:  31.1 LV IVS:        1.10 cm   LV e' lateral:   5.06 cm/s LVOT diam:     2.10 cm   LV E/e' lateral: 20.8 LV SV:         72 LV SV Index:   48  LVOT Area:     3.46 cm  RIGHT VENTRICLE             IVC RV S prime:     11.70 cm/s  IVC diam: 1.50 cm TAPSE (M-mode): 2.5 cm LEFT ATRIUM              Index        RIGHT ATRIUM           Index LA diam:        3.50 cm 2.31 cm/m   RA Area:     15.80 cm LA Vol (A2C):   39.2 ml 25.82 ml/m  RA Volume:   52.00 ml  34.29 ml/m LA Vol (A4C):   54.5 ml 35.94 ml/m LA Biplane Vol: 53.7 ml 35.41 ml/m  AORTIC VALVE                    PULMONIC VALVE AV Area (Vmax):    3.32 cm     PV Vmax:       0.70 m/s AV Area (Vmean):   3.27 cm     PV Peak grad:  2.0 mmHg AV Area (VTI):     3.29 cm AV Vmax:           117.00 cm/s AV Vmean:          80.800 cm/s AV VTI:            0.220 m AV Peak Grad:      5.5 mmHg AV Mean Grad:      3.0 mmHg LVOT Vmax:         112.00 cm/s LVOT Vmean:        76.400 cm/s LVOT VTI:          0.209 m LVOT/AV VTI ratio: 0.95  AORTA Ao Root diam: 3.60 cm Ao Asc diam:  3.40 cm MITRAL VALVE                TRICUSPID VALVE MV Area (PHT): 4.89 cm     TR Peak grad:   25.2 mmHg MV Decel Time: 155 msec     TR Vmax:        251.00 cm/s MV E velocity: 105.00 cm/s MV A velocity: 87.00 cm/s   SHUNTS MV E/A ratio:  1.21         Systemic VTI:  0.21 m                             Systemic Diam: 2.10 cm Lennie Odor MD Electronically signed by Lennie Odor MD Signature Date/Time: 04/01/2023/4:14:42 PM    Final    DG Chest Port 1 View  Result Date: 03/31/2023 CLINICAL DATA:  Shortness of breath. EXAM: PORTABLE CHEST 1 VIEW COMPARISON:  March 17, 2023. FINDINGS: Stable cardiomegaly. Status post fusion of thoracic spine. Bibasilar opacities are noted concerning for edema or atelectasis with associated pleural effusions. IMPRESSION: Stable bibasilar edema or atelectasis is noted with associated pleural effusions. Electronically Signed   By: Lupita Raider M.D.   On: 03/31/2023 12:50    Procedures Procedures    Medications Ordered in ED Medications  Chlorhexidine Gluconate Cloth 2 % PADS 6 each (6 each Topical Given 04/01/23 0856)  acetaminophen (TYLENOL) tablet 650 mg (650 mg Oral Given 03/31/23 1650)  heparin injection 5,000 Units (5,000 Units Subcutaneous Not  Given 04/01/23 1539)  ondansetron (ZOFRAN) tablet 4 mg (has no administration in time range)  Or  ondansetron (ZOFRAN) injection 4 mg (has no administration in time range)  sorbitol 70 % solution 30 mL (has no administration in time range)  carvedilol (COREG) tablet 12.5 mg (12.5 mg Oral Given 04/01/23 1749)  cloNIDine (CATAPRES) tablet 0.1 mg (0.1 mg Oral Given 04/01/23 1006)  irbesartan (AVAPRO) tablet 150 mg (150 mg Oral Given 04/01/23 1748)  pantoprazole (PROTONIX) EC tablet 40 mg (40 mg Oral Given 04/01/23 1005)  senna-docusate (Senokot-S) tablet 2 tablet (2 tablets Oral Given 04/01/23 0104)  cyclobenzaprine (FLEXERIL) tablet 10 mg (10 mg Oral Given 04/01/23 0104)  torsemide (DEMADEX) tablet 100 mg (100 mg Oral Given 04/01/23 1748)  amLODipine (NORVASC) tablet 5 mg (has no administration in time range)    And  amLODipine (NORVASC) tablet 10 mg (10 mg Oral Given 04/01/23 1005)  levETIRAcetam (KEPPRA) tablet 1,000 mg (1,000 mg Oral Given 04/01/23 1005)    And  levETIRAcetam (KEPPRA) tablet 1,000 mg (has no administration in time range)  ipratropium-albuterol (DUONEB) 0.5-2.5 (3) MG/3ML nebulizer solution 3 mL (3 mLs Nebulization Given 03/31/23 2037)    ED Course/ Medical Decision Making/ A&P Clinical Course as of 04/01/23 2339  Mon Mar 31, 2023  2346 Patient persistently tachypneic and short of breath. Treated with Duoneb. On reassessment there was minimal improvement.  Patient remained tachypneic and stated that shortness of breath was still about the same.  Admitted to hospital service with Dr. Gasper Sells for ongoing shortness of breath after hemodialysis. [JR]    Clinical Course User Index [JR] Gareth Eagle, PA-C                                 Medical Decision Making Amount and/or Complexity of Data Reviewed Labs: ordered. Radiology: ordered.  Risk Prescription drug management. Decision regarding hospitalization.          Final Clinical Impression(s) / ED  Diagnoses Final diagnoses:  Acute pulmonary edema (HCC)  ESRD (end stage renal disease) on dialysis [facility])    Rx / DC Orders ED Discharge Orders          Ordered    Discharge instructions       Comments: Recommendations at discharge:   Ensure compliance with your medications and dialysis.  Cardiology appointment with Dr. Bjorn Pippin 11/12 @8am  ([facility] office)   General discharge instructions: Follow with Primary MD [facility] [name], FNP in 7 days  Please request your PCP  to go over your hospital tests, procedures, radiology results at the follow up. Please get your medicines reviewed and adjusted.  Your PCP may decide to repeat certain labs or tests as needed. Do not drive, operate heavy machinery, perform activities at heights, swimming or participation in water activities or provide baby sitting services if your were admitted for syncope or siezures until you have seen by Primary MD or a Neurologist and advised to do so again. [place] Controlled Substance Reporting System database was reviewed. Do not drive, operate heavy machinery, perform activities at heights, swim, participate in water activities or provide baby-sitting services while on medications for pain, sleep and mood until your outpatient physician has reevaluated you and advised to do so again.  You are strongly recommended to comply with the dose, frequency and duration of prescribed medications. Activity: As tolerated with Full fall precautions use walker/cane & assistance as needed Avoid using any recreational substances like cigarette, tobacco, alcohol, or non-prescribed drug. If you experience worsening of your admission  symptoms, develop shortness of breath, life threatening emergency, suicidal or homicidal thoughts you must seek medical attention immediately by calling 911 or calling your MD immediately  if symptoms less severe. You must read complete instructions/literature along with all the possible  adverse reactions/side effects for all the medicines you take and that have been prescribed to you. Take any new medicine only after you have completely understood and accepted all the possible adverse reactions/side effects.  Wear Seat belts while driving. You were cared for by a hospitalist during your hospital stay. If you have any questions about your discharge medications or the care you received while you were in the hospital after you are discharged, you can call the unit and ask to speak with the hospitalist or the covering physician. Once you are discharged, your primary care physician will handle any further medical issues. Please note that NO REFILLS for any discharge medications will be authorized once you are discharged, as it is imperative that you return to your primary care physician (or establish a relationship with a primary care physician if you do not have one).   04/01/23 1716    Increase activity slowly        04/01/23 1716    Diet general       Comments: Dialysis diet   04/01/23 1716    Discharge wound care:        04/01/23 1716    Call MD for:  temperature >100.4        04/01/23 1716    Call MD for:  persistant nausea and vomiting        04/01/23 1716    Call MD for:  severe uncontrolled pain        04/01/23 1716    Call MD for:  difficulty breathing, headache or visual disturbances        04/01/23 1716    Call MD for:  hives        04/01/23 1716    Call MD for:  persistant dizziness or light-headedness        04/01/23 1716    Call MD for:  extreme fatigue        04/01/23 1716              Gareth Eagle, PA-C 04/01/23 2339    Durwin Glaze, MD 04/09/23 1745

## 2023-03-31 NOTE — ED Notes (Signed)
Pt is sleeping

## 2023-03-31 NOTE — Procedures (Signed)
I have reviewed the HD regimen and made appropriate changes.  Vinson Moselle MD  [facility] 03/31/2023, 3:57 PM

## 2023-03-31 NOTE — ED Notes (Signed)
NAD noted at this time, pt resting in bed, respirations even and unlabored, tachypnea at times, skin warm and dry, bed in low position, call light within reach. Comfort measures offered. Will continue to monitor. Pt denies any needs.

## 2023-04-01 ENCOUNTER — Inpatient Hospital Stay (HOSPITAL_BASED_OUTPATIENT_CLINIC_OR_DEPARTMENT_OTHER): Payer: 59

## 2023-04-01 DIAGNOSIS — R9431 Abnormal electrocardiogram [ECG] [EKG]: Secondary | ICD-10-CM

## 2023-04-01 DIAGNOSIS — Z9359 Other cystostomy status: Secondary | ICD-10-CM

## 2023-04-01 DIAGNOSIS — J9601 Acute respiratory failure with hypoxia: Secondary | ICD-10-CM | POA: Diagnosis not present

## 2023-04-01 DIAGNOSIS — G8220 Paraplegia, unspecified: Secondary | ICD-10-CM

## 2023-04-01 DIAGNOSIS — G40909 Epilepsy, unspecified, not intractable, without status epilepticus: Secondary | ICD-10-CM | POA: Diagnosis not present

## 2023-04-01 DIAGNOSIS — J9691 Respiratory failure, unspecified with hypoxia: Secondary | ICD-10-CM | POA: Diagnosis present

## 2023-04-01 DIAGNOSIS — N186 End stage renal disease: Secondary | ICD-10-CM | POA: Diagnosis not present

## 2023-04-01 LAB — CBC
HCT: 32.6 % — ABNORMAL LOW (ref 36.0–46.0)
Hemoglobin: 9.6 g/dL — ABNORMAL LOW (ref 12.0–15.0)
MCH: 27.5 pg (ref 26.0–34.0)
MCHC: 29.4 g/dL — ABNORMAL LOW (ref 30.0–36.0)
MCV: 93.4 fL (ref 80.0–100.0)
Platelets: 176 10*3/uL (ref 150–400)
RBC: 3.49 MIL/uL — ABNORMAL LOW (ref 3.87–5.11)
RDW: 20.9 % — ABNORMAL HIGH (ref 11.5–15.5)
WBC: 6.4 10*3/uL (ref 4.0–10.5)
nRBC: 0 % (ref 0.0–0.2)

## 2023-04-01 LAB — ECHOCARDIOGRAM COMPLETE
AR max vel: 3.32 cm2
AV Area VTI: 3.29 cm2
AV Area mean vel: 3.27 cm2
AV Mean grad: 3 mm[Hg]
AV Peak grad: 5.5 mm[Hg]
Ao pk vel: 1.17 m/s
Area-P 1/2: 4.89 cm2
S' Lateral: 3.4 cm
Weight: 1855.39 [oz_av]

## 2023-04-01 LAB — BASIC METABOLIC PANEL
Anion gap: 11 (ref 5–15)
BUN: 34 mg/dL — ABNORMAL HIGH (ref 6–20)
CO2: 24 mmol/L (ref 22–32)
Calcium: 8.5 mg/dL — ABNORMAL LOW (ref 8.9–10.3)
Chloride: 97 mmol/L — ABNORMAL LOW (ref 98–111)
Creatinine, Ser: 3.54 mg/dL — ABNORMAL HIGH (ref 0.44–1.00)
GFR, Estimated: 15 mL/min — ABNORMAL LOW (ref >60)
Glucose, Bld: 76 mg/dL (ref 70–99)
Potassium: 4.7 mmol/L (ref 3.5–5.1)
Sodium: 132 mmol/L — ABNORMAL LOW (ref 135–145)

## 2023-04-01 LAB — PHOSPHORUS: Phosphorus: 4.2 mg/dL (ref 2.5–4.6)

## 2023-04-01 LAB — GLUCOSE, CAPILLARY: Glucose-Capillary: 111 mg/dL — ABNORMAL HIGH (ref 70–99)

## 2023-04-01 MED ORDER — LEVETIRACETAM 500 MG PO TABS
1000.0000 mg | ORAL_TABLET | Freq: Every day | ORAL | Status: DC
  Administered 2023-04-01: 1000 mg via ORAL
  Filled 2023-04-01: qty 2

## 2023-04-01 MED ORDER — LEVETIRACETAM 500 MG PO TABS
1000.0000 mg | ORAL_TABLET | ORAL | Status: DC
Start: 2023-04-02 — End: 2023-04-02

## 2023-04-01 MED ORDER — CLONIDINE HCL 0.1 MG PO TABS
0.1000 mg | ORAL_TABLET | Freq: Two times a day (BID) | ORAL | Status: DC
Start: 2023-04-01 — End: 2023-04-02
  Administered 2023-04-01: 0.1 mg via ORAL
  Filled 2023-04-01: qty 1

## 2023-04-01 MED ORDER — AMLODIPINE BESYLATE 5 MG PO TABS: 5.0000 mg | ORAL_TABLET | Freq: Every day | ORAL | Status: DC

## 2023-04-01 MED ORDER — CARVEDILOL 12.5 MG PO TABS
12.5000 mg | ORAL_TABLET | Freq: Two times a day (BID) | ORAL | Status: DC
  Administered 2023-04-01 (×2): 12.5 mg via ORAL
  Filled 2023-04-01 (×2): qty 1

## 2023-04-01 MED ORDER — HEPARIN SODIUM (PORCINE) 5000 UNIT/ML IJ SOLN
5000.0000 [IU] | Freq: Three times a day (TID) | INTRAMUSCULAR | Status: DC
Start: 2023-04-01 — End: 2023-04-02

## 2023-04-01 MED ORDER — AMLODIPINE BESYLATE 10 MG PO TABS
10.0000 mg | ORAL_TABLET | ORAL | Status: DC
  Administered 2023-04-01: 10 mg via ORAL
  Filled 2023-04-01: qty 1

## 2023-04-01 MED ORDER — LEVETIRACETAM 500 MG PO TABS: 1000.0000 mg | ORAL_TABLET | Freq: Every evening | ORAL | Status: DC

## 2023-04-01 MED ORDER — SENNOSIDES-DOCUSATE SODIUM 8.6-50 MG PO TABS
2.0000 | ORAL_TABLET | Freq: Every day | ORAL | Status: DC
Start: 2023-04-01 — End: 2023-04-02
  Administered 2023-04-01: 2 via ORAL
  Filled 2023-04-01: qty 2

## 2023-04-01 MED ORDER — ONDANSETRON HCL 4 MG PO TABS: 4.0000 mg | ORAL_TABLET | Freq: Four times a day (QID) | ORAL | Status: DC | PRN

## 2023-04-01 MED ORDER — SORBITOL 70 % SOLN: 30.0000 mL | Freq: Every day | Status: DC | PRN

## 2023-04-01 MED ORDER — CYCLOBENZAPRINE HCL 10 MG PO TABS
10.0000 mg | ORAL_TABLET | Freq: Three times a day (TID) | ORAL | Status: DC | PRN
  Administered 2023-04-01: 10 mg via ORAL
  Filled 2023-04-01: qty 1

## 2023-04-01 MED ORDER — PANTOPRAZOLE SODIUM 40 MG PO TBEC
40.0000 mg | DELAYED_RELEASE_TABLET | Freq: Every day | ORAL | Status: DC
  Administered 2023-04-01: 40 mg via ORAL
  Filled 2023-04-01: qty 1

## 2023-04-01 MED ORDER — TORSEMIDE 20 MG PO TABS: 100.0000 mg | ORAL_TABLET | Freq: Two times a day (BID) | ORAL | Status: DC

## 2023-04-01 MED ORDER — TORSEMIDE 20 MG PO TABS
100.0000 mg | ORAL_TABLET | Freq: Two times a day (BID) | ORAL | Status: DC
  Administered 2023-04-01 (×2): 100 mg via ORAL
  Filled 2023-04-01 (×2): qty 5

## 2023-04-01 MED ORDER — IRBESARTAN 75 MG PO TABS
150.0000 mg | ORAL_TABLET | Freq: Every day | ORAL | Status: DC
  Administered 2023-04-01: 150 mg via ORAL
  Filled 2023-04-01: qty 2

## 2023-04-01 MED ORDER — AMLODIPINE BESYLATE 5 MG PO TABS: 5.0000 mg | ORAL_TABLET | ORAL | Status: DC

## 2023-04-01 MED ORDER — ONDANSETRON HCL 4 MG/2ML IJ SOLN: 4.0000 mg | Freq: Four times a day (QID) | INTRAMUSCULAR | Status: DC | PRN

## 2023-04-01 NOTE — Plan of Care (Signed)

## 2023-04-01 NOTE — ED Notes (Signed)
Pt sleeping at this time, NAD noted. Resp even and unlabored, bed in low position, SR noted, will continue to monitor.

## 2023-04-01 NOTE — Consult Note (Signed)
WOC Nurse Consult Note: Reason for Consult: Consult requested for bilat hip wounds. Pt has chronic Stage 4 pressure injuries which are slowing improving and decreasing in depth, Pt states.  Left hip 2X2X.4cm, 100% red and moist, mod amt yellow drainage, white macerated edges.   Right hip 1X1X.3cm, 100% red and moist, mod amt yellow drainage, white macerated edges.  Pressure Injury POA: Yes Dressing procedure/placement/frequency: Air mattress ordered to decrease pressure.  Topical treatment orders provided for bedside nurses to perform as follows to provide antimicrobial benefits and absorb drainage: Cut piece of Aquacel [place] # [phone]) and tuck into left and right hip wounds Q day, then cover with foam dressings. Change foam dressings Q 3 days or PRN soiling. Please re-consult if further assistance is needed.  Thank-you,  Cammie Mcgee MSN, RN, CWOCN, [place], CNS [phone]

## 2023-04-01 NOTE — TOC CM/SW Note (Addendum)
Transition of Care [facility]) - Inpatient Brief Assessment   Patient Details  Name: [name] MRN: [id number] Date of Birth: [birth year]  [facility] CM/SW Contact:    Tom-Johnson, Hershal Coria, RN Phone Number: 04/01/2023, 11:37 AM   Clinical Narrative:  Patient presented to the ED with SOB and Chest Pain. Currently on Room Air. Does not use home O2.  Patient has Hx of ESRD and on MWF outpatient Dialysis schedule. Nephrology following for inpatient Dialysis.  Uses [facility] to and from Dialysis. Has significant hx of Paraplegia, Seizure Disorder, HTN, Pressure Ulcers and Osteomyelitis. Has a chronic Suprapubic Catheter.    From home with her daughter [name], has three supportive children. Patient is bed/wheelchair bound at home. [name] is her primary caregiver.  Has all necessary DME's at home.   PCP is [place], [name], FNP and uses [address] in [place].   Active with [employer] with RN disciplines. CM called in resumption of care and [facility] voiced acceptance, info on AVS.   Patient not Medically ready for discharge.  CM will continue to follow as patient progresses with care towards discharge.              Transition of Care Asessment: Insurance and Status: Insurance coverage has been reviewed Patient has primary care physician: Yes Home environment has been reviewed: Yes Prior level of function:: Dependent- Paralysis Prior/Current Home Services: Current home services (Home Health RN with [facility].) Social Determinants of Health Reivew: SDOH reviewed no interventions necessary Readmission risk has been reviewed: Yes Transition of care needs: transition of care needs identified, TOC will continue to follow

## 2023-04-01 NOTE — ED Notes (Signed)
NAD noted at this time, pt resting in bed, respirations even and unlabored, skin warm and dry, bed in low position, call light within reach. Comfort measures offered. Will continue to monitor. New orders noted.

## 2023-04-01 NOTE — Progress Notes (Signed)
PROGRESS NOTE  [name]  DOB: Apr 22, 1966  PCP: Richarda Osmond, [place] IHK:[id number]  DOA: 03/31/2023  LOS: 0 days  Hospital Day: 2  Brief narrative: [name] is a 57 y.o. female with PMH significant for ESRD-HD-MWF, paraplegia with suprapubic catheter, UTI, pressure ulcers, osteomyelitis, seizure disorder, chronic pain, HTN, GERD. 11/4, patient was brought to the ED by EMS for shortness of breath. Recently hospitalized at [facility] 11/21 to 11/22 for shortness of breath, found to have volume overload, improved with dialysis and was discharged home. On 11/4, patient was having more than usual dyspnea and severe chest pain and she called 911.  She had a hemodialysis 3 days prior on Friday on schedule.   In the ED, patient afebrile, tachypneic, blood pressure in 200s.  O2 sat was low at 89% on room air and required supplemental oxygen.   Labs with potassium 5.3 Chest x-ray showed bilateral pulm edema Seen by nephrology.  Patient underwent dialysis. Afterwards the patient said her chest pain was gone but she continued to feel more short of breath than usual particularly at rest.  After dialysis she was still having trouble completing full sentences.  She was still tachypneic and she still required 2 L O2 nasal cannula.   She was hence admitted to [facility]  Subjective: Patient was seen and examined this morning.  Pleasant middle-aged female.  Not in distress. Breathing better.  Chest pain resolved. Overnight, blood pressure 160s Labs this morning sodium 132, creatinine 3.54, hemoglobin 9.6  Assessment and plan: Acute respiratory failure with hypoxia Acute pulm edema ESRD-HD-MWF Presented with shortness of breath, noted to have hypoxia.  Chest x-ray with pulm edema Seen by nephrology.  Underwent dialysis.  Chest pain Complaint of chest pain along with shortness of breath.  Troponin in 20s EKG with normal sinus rhythm, QTc 508 ms. Echocardiogram showed a new drop  in EF to 45 to 50% compared to normal echo from 4 months ago.  Global hypokinesis present as well.  Cardiology consulted. Recent Labs    03/31/23 0903 03/31/23 1426  TROPONINIHS 27* 22*   Uncontrolled hypertension Initial blood pressure was over 200. PTA meds- carvedilol 12.5 mg twice daily, clonidine 0.1 mg twice daily, amlodipine 5 mg daily, irbesartan 150 mg daily, torsemide 100 mg twice daily Currently continued on all.  Chronic anemia GERD Hemoglobin close to 9 at baseline. Continue Protonix Recent Labs    03/06/23 0836 03/17/23 1002 03/18/23 0404 03/31/23 0903 04/01/23 0535  HGB 7.8* 9.6* 8.2* 9.3* 9.6*  MCV 91.4 93.9 92.3 95.8 93.4   Paraplegia  S/p suprapubic catheter, h/o UTI pressure ulcers, osteomyelitis Chronic pain Supportive care PTA pain meds -Flexeril as needed  Seizure disorder Continue Keppra 1000 mg nightly Chronic pain  Mobility: Paraplegic.  Bedbound.  Uses Hoyer lift  Goals of care   Code Status: Full Code     DVT prophylaxis:  heparin injection 5,000 Units Start: 04/01/23 1400   Antimicrobials: None Fluid: None Consultants: Nephrology, cardiology called Family Communication: None at bedside  Status: Inpatient Level of care:  Med-Surg   Patient is from: Home Needs to continue in-hospital care: Noted new drop in EF.  Cardiology consulted.  Unable to discharge today. Anticipated d/c to: Home eventually      Diet:  Diet Order             Diet renal with fluid restriction Fluid restriction: 1200 mL Fluid; Room service appropriate? Yes; Fluid consistency: Thin  Diet effective now  Scheduled Meds:  [START ON 04/02/2023] amLODipine  5 mg Oral Q M,W,F   And   amLODipine  10 mg Oral Once per day on Sunday Tuesday Thursday Saturday   carvedilol  12.5 mg Oral BID WC   Chlorhexidine Gluconate Cloth  6 each Topical Q0600   cloNIDine  0.1 mg Oral BID   heparin  5,000 Units Subcutaneous Q8H   irbesartan  150 mg  Oral q1800   levETIRAcetam  1,000 mg Oral Daily   And   [START ON 04/02/2023] levETIRAcetam  1,000 mg Oral Q M,W,F-2000   pantoprazole  40 mg Oral Daily   senna-docusate  2 tablet Oral QHS   torsemide  100 mg Oral BID    PRN meds: acetaminophen, cyclobenzaprine, ondansetron **OR** ondansetron (ZOFRAN) IV, sorbitol   Infusions:    Antimicrobials: Anti-infectives (From admission, onward)    None       Objective: Vitals:   04/01/23 1148 04/01/23 1554  BP: (!) 169/94 (!) 141/80  Pulse: 83 80  Resp: 19 17  Temp: 98.1 F (36.7 C) 97.6 F (36.4 C)  SpO2: 95% 95%    Intake/Output Summary (Last 24 hours) at 04/01/2023 1627 Last data filed at 04/01/2023 0958 Gross per 24 hour  Intake 240 ml  Output 2400 ml  Net -2160 ml   Filed Weights   03/31/23 1343  Weight: 52.6 kg   Weight change:  Body mass index is 21.21 kg/m.   Physical Exam: General exam: Pleasant, middle-aged female.  Not in distress Skin: No rashes, lesions or ulcers. HEENT: Atraumatic, normocephalic, no obvious bleeding Lungs: Clear to auscultation bilaterally, CVS: Regular rate and rhythm, no murmur GI/Abd soft, nontender, nondistended, bowel sound present CNS: Alert, awake, oriented x 3 Psychiatry: Mood appropriate. Extremities: Atrophic from paraplegia years.  No pedal edema.  Data Review: I have personally reviewed the laboratory data and studies available.  F/u labs  Unresulted Labs (From admission, onward)     Start     Ordered   04/02/23 0500  Renal function panel  Tomorrow morning,   R        04/01/23 1049   03/31/23 1430  Hepatitis B surface antibody,quantitative  Once,   URGENT        11 /04/24 1430   Signed and Held  Renal function panel  Once,   R        Signed and Held   Signed and Held  CBC  Once,   R        Signed and Held            Admission date and time: 03/31/2023  8:40 AM   Total time spent in review of labs and imaging, patient evaluation, formulation of plan,  documentation and communication with family: 55 minutes  Signed, Lorin Glass, MD [facility] 04/01/2023

## 2023-04-01 NOTE — Care Management CC44 (Signed)
Condition Code 44 Documentation Completed  Patient Details  Name: [name] MRN: [id number] Date of Birth: 1966/03/15   Condition Code 44 given:  Yes Patient signature on Condition Code 44 notice:  Yes Documentation of 2 MD's agreement:  Yes Code 44 added to claim:  Yes    Michel Bickers, RN 04/01/2023, 5:34 PM

## 2023-04-01 NOTE — Progress Notes (Signed)
Pt receives out-pt HD at [facility] on MWF. Will assist as needed.   Olivia Canter Renal Navigator [phone]

## 2023-04-01 NOTE — Progress Notes (Signed)
*  PRELIMINARY RESULTS* Echocardiogram 2D Echocardiogram has been performed.  [name] 04/01/2023, 3:35 PM

## 2023-04-01 NOTE — Care Management Obs Status (Signed)
MEDICARE OBSERVATION STATUS NOTIFICATION   Patient Details  Name: [name] MRN: [id number] Date of Birth: Oct 10, 1965   Medicare Observation Status Notification Given:  Waylan Boga, RN 04/01/2023, 5:34 PM

## 2023-04-01 NOTE — ED Notes (Signed)
ED TO INPATIENT HANDOFF REPORT  ED Nurse Name and Phone #: Juliette Alcide [phone]  S Name/Age/Gender [name] 57 y.o. female Room/Bed: 037C/037C  Code Status   Code Status: Full Code  Home/SNF/Other Home Patient oriented to: self, place, time, and situation Is this baseline? Yes   Triage Complete: Triage complete  Chief Complaint Acute pulmonary edema (HCC) [J81.0] ESRD (end stage renal disease) on dialysis (HCC) [N18.6, Z99.2] Respiratory failure with hypoxia (HCC) [J96.91]  Triage Note Pt arrives via ems to the er for the c/o sob, chest pain. Pt is due for dialysis today. 89% RA 02, placed on 4/L by fire new 02 98%. Ems gave 0.4nitrog sublingual,  324 asp. Initial BP 200/100, last one 156/95. Hr on arrival 21. Pt states chest pain is left sided, doesn't radiate 7/10.    Allergies Allergies  Allergen Reactions   Benadryl [Diphenhydramine Hcl (Sleep)] Hives   Daptomycin Hives   Linezolid     Other Reaction(s): GI Intolerance  Patient self-discontinued treatment due to GI intolerance. Taking it along with moxifloxacin   Moxifloxacin     Other Reaction(s): GI Intolerance  Patient self-discontinued treatment due to GI intolerance. Taking it along with linezolid   Quinine Derivatives Other (See Comments)    Alters mental status   Vancomycin     Pt is tolerating this medication at HD   Azithromycin Itching and Rash   Tetracycline Itching    Able to tolerate Doxycycline.    Zosyn [Piperacillin Sod-Tazobactam So] Rash    Level of Care/Admitting Diagnosis ED Disposition     ED Disposition  Admit   Condition  --   Comment  Hospital Area: [facility] [[id number]]  Level of Care: Med-Surg [16]  May admit patient to [facility] or [facility] if equivalent level of care is available:: No  Covid Evaluation: Asymptomatic - no recent exposure (last 10 days) testing not required  Diagnosis: Respiratory failure with hypoxia [facility]) [[id number]]  Admitting  Physician: Buena Irish [[id number]]  Attending Physician: Buena Irish [phone]  Certification:: I certify this patient will need inpatient services for at least 2 midnights  Expected Medical Readiness: 04/03/2023          B Medical/Surgery History Past Medical History:  Diagnosis Date   Abnormal uterine bleeding (AUB) 06/15/2014   Cancer (HCC)    uterine   High blood pressure    Paraplegia (lower)    Renal disorder    Seizure disorder (HCC)    Seizures (HCC)    Suprapubic catheter (HCC)    Urinary tract infection    Past Surgical History:  Procedure Laterality Date   APPLICATION OF WOUND VAC Right 09/13/2021   (approximately 1-54mos ago) pressure sore on right hip   BACK SURGERY     Pt stated "before 2000"   BIOPSY  12/03/2022   Procedure: BIOPSY;  Surgeon: Dolores Frame, MD;  Location: [facility];  Service: Gastroenterology;;   ESOPHAGOGASTRODUODENOSCOPY N/A 09/20/2015   Procedure: ESOPHAGOGASTRODUODENOSCOPY (EGD);  Surgeon: Malissa Hippo, MD;  Location: [facility];  Service: Endoscopy;  Laterality: N/A;  730   ESOPHAGOGASTRODUODENOSCOPY (EGD) WITH PROPOFOL N/A 12/03/2022   Procedure: ESOPHAGOGASTRODUODENOSCOPY (EGD) WITH PROPOFOL;  Surgeon: Dolores Frame, MD;  Location: [facility];  Service: Gastroenterology;  Laterality: N/A;  1:15 pm, asa 3, pt knows to arrive at 10:30  dialysis pt, M,W & F   IR CATHETER TUBE CHANGE  04/02/2018   PERCUTANEOUS ENDOSCOPIC GASTROSTOMY (PEG) REMOVAL N/A 09/20/2015  Procedure: PERCUTANEOUS ENDOSCOPIC GASTROSTOMY (PEG) REMOVAL;  Surgeon: Malissa Hippo, MD;  Location[facility];  Service: Endoscopy;  Laterality: N/A;   TEE WITHOUT CARDIOVERSION N/A 11/11/2022   Procedure: TRANSESOPHAGEAL ECHOCARDIOGRAM (TEE);  Surgeon: Pricilla Riffle, MD;  Location: [facility] ORS;  Service: Cardiovascular;  Laterality: N/A;     A IV Location/Drains/Wounds Patient Lines/Drains/Airways Status     Active Line/Drains/Airways      Name Placement date Placement time Site Days   Peripheral IV 03/31/23 18 G Right Antecubital 03/31/23  0856  Antecubital  1   Fistula / Graft Left Upper arm Arteriovenous fistula --  --  Upper arm  --   Suprapubic Catheter Latex --  --  Latex  --   Pressure Injury 02/26/22 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 02/26/22  0120  -- 399   Pressure Injury 02/26/22 Ischial tuberosity Right Stage 4 - Full thickness tissue loss with exposed bone, tendon or muscle. 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 cm with 1.5cm depth small amount of serous exudate 07/24/22  2239  Ischial tuberosity  251            Intake/Output Last 24 hours  Intake/Output Summary (Last 24 hours) at 04/01/2023 0646 Last data filed at 03/31/2023 1700 Gross per 24 hour  Intake --  Output 2400 ml  Net -2400 ml    Labs/Imaging Results for orders placed or performed during the hospital encounter of 03/31/23 (from the past 48 hour(s))  Basic metabolic panel     Status: Abnormal   Collection Time: 03/31/23  9:03 AM  Result Value Ref Range   Sodium 135 135 - 145 mmol/L   Potassium 5.3 (H) 3.5 - 5.1 mmol/L   Chloride 99 98 - 111 mmol/L   CO2 21 (L) 22 - 32 mmol/L   Glucose, Bld 104 (H) 70 - 99 mg/dL    Comment: Glucose reference range applies only to samples taken after fasting for at least 8 hours.   BUN 67 (H) 6 - 20 mg/dL   Creatinine, Ser 1.61 (H) 0.44 - 1.00 mg/dL   Calcium 8.8 (L) 8.9 - 10.3 mg/dL   GFR, Estimated 9 (L) >60 mL/min    Comment: (NOTE) Calculated using the CKD-EPI Creatinine Equation (2021)    Anion gap 15 5 - 15    Comment: Performed at [facility], [address], [place], [place] [address]  CBC with Differential     Status: Abnormal   Collection Time: 03/31/23  9:03 AM  Result Value Ref Range   WBC 6.1 4.0 - 10.5 K/uL   RBC 3.36 (L) 3.87 -  5.11 MIL/uL   Hemoglobin 9.3 (L) 12.0 - 15.0 g/dL   HCT 54.0 (L) 98.1 - 19.1 %   MCV 95.8 80.0 - 100.0 fL   MCH 27.7 26.0 - 34.0 pg   MCHC 28.9 (L) 30.0 - 36.0 g/dL   RDW 47.8 (H) 29.5 - 62.1 %   Platelets 169 150 - 400 K/uL   nRBC 0.0 0.0 - 0.2 %   Neutrophils Relative % 71 %   Neutro Abs 4.3 1.7 - 7.7 K/uL   Lymphocytes Relative 18 %   Lymphs Abs 1.1 0.7 - 4.0 K/uL   Monocytes Relative 5 %   Monocytes Absolute 0.3 0.1 - 1.0 K/uL   Eosinophils Relative 5 %  Eosinophils Absolute 0.3 0.0 - 0.5 K/uL   Basophils Relative 1 %   Basophils Absolute 0.1 0.0 - 0.1 K/uL   WBC Morphology MORPHOLOGY UNREMARKABLE    RBC Morphology MORPHOLOGY UNREMARKABLE    Smear Review Normal platelet morphology    Immature Granulocytes 0 %   Abs Immature Granulocytes 0.02 0.00 - 0.07 K/uL    Comment: Performed at [facility], [address], [place], [place] [address]  Troponin I (High Sensitivity)     Status: Abnormal   Collection Time: 03/31/23  9:03 AM  Result Value Ref Range   Troponin I (High Sensitivity) 27 (H) <18 ng/L    Comment: (NOTE) Elevated high sensitivity troponin I (hsTnI) values and significant  changes across serial measurements may suggest ACS but many other  chronic and acute conditions are known to elevate hsTnI results.  Refer to the "Links" section for chest pain algorithms and additional  guidance. Performed at [facility], [address], [place], [place] [address]   Troponin I (High Sensitivity)     Status: Abnormal   Collection Time: 03/31/23  2:26 PM  Result Value Ref Range   Troponin I (High Sensitivity) 22 (H) <18 ng/L    Comment: (NOTE) Elevated high sensitivity troponin I (hsTnI) values and significant  changes across serial measurements may suggest ACS but many other  chronic and acute conditions are known to elevate hsTnI results.  Refer to the "Links" section for chest pain algorithms and additional  guidance. Performed at [facility]  Lab, [address], [place], [place] [address]   Hepatitis B surface antigen     Status: None   Collection Time: 03/31/23  5:34 PM  Result Value Ref Range   Hepatitis B Surface Ag NON REACTIVE NON REACTIVE    Comment: Performed at [facility], [address] [place], [place] [address]  Basic metabolic panel     Status: Abnormal   Collection Time: 04/01/23  5:35 AM  Result Value Ref Range   Sodium 132 (L) 135 - 145 mmol/L   Potassium 4.7 3.5 - 5.1 mmol/L   Chloride 97 (L) 98 - 111 mmol/L   CO2 24 22 - 32 mmol/L   Glucose, Bld 76 70 - 99 mg/dL    Comment: Glucose reference range applies only to samples taken after fasting for at least 8 hours.   BUN 34 (H) 6 - 20 mg/dL   Creatinine, Ser 2.95 (H) 0.44 - 1.00 mg/dL   Calcium 8.5 (L) 8.9 - 10.3 mg/dL   GFR, Estimated 15 (L) >60 mL/min    Comment: (NOTE) Calculated using the CKD-EPI Creatinine Equation (2021)    Anion gap 11 5 - 15    Comment: Performed at [facility], [address] [place], [place] [address]  CBC     Status: Abnormal   Collection Time: 04/01/23  5:35 AM  Result Value Ref Range   WBC 6.4 4.0 - 10.5 K/uL   RBC 3.49 (L) 3.87 - 5.11 MIL/uL   Hemoglobin 9.6 (L) 12.0 - 15.0 g/dL   HCT 86.5 (L) 78.4 - 69.6 %   MCV 93.4 80.0 - 100.0 fL   MCH 27.5 26.0 - 34.0 pg   MCHC 29.4 (L) 30.0 - 36.0 g/dL   RDW 29.5 (H) 28.4 - 13.2 %   Platelets 176 150 - 400 K/uL   nRBC 0.0 0.0 - 0.2 %    Comment: Performed at [facility],  [address], [place], [place] [address]   DG Chest Port 1 View  Result Date: 03/31/2023 CLINICAL DATA:  Shortness of breath. EXAM: PORTABLE CHEST 1 VIEW COMPARISON:  March 17, 2023. FINDINGS: Stable cardiomegaly. Status post fusion of thoracic spine. Bibasilar opacities are noted concerning for edema or atelectasis with associated pleural effusions. IMPRESSION: Stable bibasilar edema or atelectasis is noted with associated pleural effusions. Electronically Signed   By: Lupita Raider  M.D.   On: 03/31/2023 12:50    Pending Labs Unresulted Labs (From admission, onward)     Start     Ordered   03/31/23 1430  Hepatitis B surface antibody,quantitative  Once,   URGENT        03/31/23 1430            Vitals/Pain Today's Vitals   04/01/23 0400 04/01/23 0500 04/01/23 0529 04/01/23 0600  BP: (!) 162/80 (!) 160/81  (!) 165/85  Pulse: 75 74 76 77  Resp: (!) 23 (!) 22 17 (!) 25  Temp:   98 F (36.7 C)   TempSrc:   Oral   SpO2: 96% 97% 98% 97%  Weight:      PainSc: Asleep Asleep  Asleep    Isolation Precautions No active isolations  Medications Medications  Chlorhexidine Gluconate Cloth 2 % PADS 6 each (has no administration in time range)  acetaminophen (TYLENOL) tablet 650 mg (650 mg Oral Given 03/31/23 1650)  heparin injection 5,000 Units (has no administration in time range)  ondansetron (ZOFRAN) tablet 4 mg (has no administration in time range)    Or  ondansetron (ZOFRAN) injection 4 mg (has no administration in time range)  sorbitol 70 % solution 30 mL (has no administration in time range)  carvedilol (COREG) tablet 12.5 mg (has no administration in time range)  cloNIDine (CATAPRES) tablet 0.1 mg (has no administration in time range)  irbesartan (AVAPRO) tablet 150 mg (has no administration in time range)  pantoprazole (PROTONIX) EC tablet 40 mg (has no administration in time range)  senna-docusate (Senokot-S) tablet 2 tablet (2 tablets Oral Given 04/01/23 0104)  cyclobenzaprine (FLEXERIL) tablet 10 mg (10 mg Oral Given 04/01/23 0104)  torsemide (DEMADEX) tablet 100 mg (has no administration in time range)  amLODipine (NORVASC) tablet 5 mg (has no administration in time range)    And  amLODipine (NORVASC) tablet 10 mg (has no administration in time range)  levETIRAcetam (KEPPRA) tablet 1,000 mg (has no administration in time range)    And  levETIRAcetam (KEPPRA) tablet 1,000 mg (has no administration in time range)  ipratropium-albuterol (DUONEB)  0.5-2.5 (3) MG/3ML nebulizer solution 3 mL (3 mLs Nebulization Given 03/31/23 2037)    Mobility non-ambulatory     Focused Assessments Renal Assessment Handoff:  Hemodialysis Schedule: Hemodialysis Schedule: Monday/Wednesday/Friday Last Hemodialysis date and time: 03/31/23   Restricted appendage: left arm  , Pulmonary Assessment Handoff:  Lung sounds: Bilateral Breath Sounds: Rhonchi O2 Device: Room Air      R Recommendations: See Admitting Provider Note  Report given to:   Additional Notes: paraplegia

## 2023-04-01 NOTE — Discharge Summary (Signed)
Physician Discharge Summary  [name] BJY:[id number] DOB: 13-Aug-1965 DOA: 03/31/2023  PCP: Richarda Osmond, FNP  Admit date: 03/31/2023 Discharge date: 04/01/2023  Admitted From: Home Discharge disposition: Home  Recommendations at discharge:  Ensure compliance with your medications and dialysis. Cardiology appointment with Dr. Bjorn Pippin 11/12 @8am  ([facility] office)   Brief narrative: [name] is a 57 y.o. female with PMH significant for ESRD-HD-MWF, paraplegia with suprapubic catheter, UTI, pressure ulcers, osteomyelitis, seizure disorder, chronic pain, HTN, GERD. 11/4, patient was brought to the ED by EMS for shortness of breath. Recently hospitalized at [facility] 11/21 to 11/22 for shortness of breath, found to have volume overload, improved with dialysis and was discharged home. On 11/4, patient was having more than usual dyspnea and severe chest pain and she called 911.  She had a hemodialysis 3 days prior on Friday on schedule.   In the ED, patient afebrile, tachypneic, blood pressure in 200s.  O2 sat was low at 89% on room air and required supplemental oxygen.   Labs with potassium 5.3 Chest x-ray showed bilateral pulm edema Seen by nephrology.  Patient underwent dialysis. Afterwards the patient said her chest pain was gone but she continued to feel more short of breath than usual particularly at rest.  After dialysis she was still having trouble completing full sentences.  She was still tachypneic and she still required 2 L O2 nasal cannula.   She was hence admitted to [facility].  Subjective: Patient was seen and examined this morning.  Pleasant middle-aged female.  Not in distress. Breathing better.  Chest pain resolved. Overnight, blood pressure 160s Labs this morning sodium 132, creatinine 3.54, hemoglobin 9.6  Assessment and plan: Acute respiratory failure with hypoxia Acute pulm edema ESRD-HD-MWF Presented with shortness of breath, noted to have  hypoxia.  Chest x-ray with pulm edema Seen by nephrology.  Underwent dialysis.  Chest pain Complaint of chest pain along with shortness of breath.  Troponin in 20s EKG with normal sinus rhythm, QTc 508 ms. Echocardiogram showed a new drop in EF to 45 to 50% compared to normal echo from 4 months ago.  Global hypokinesis present as well.  I discussed with cardiology navigator Trish for potential cardiology consult.  She discussed with cardiologist Dr. Jens Som who looked at both echoes and did not see much difference that would warrant any further intervention.  Hence no need of inpatient cardiology evaluation.  Plan to discharge home to follow-up with cardiology as an outpatient.  Dr. Bjorn Pippin 11/12 @8am  ([facility] office)  Recent Labs    03/31/23 0903 03/31/23 1426  TROPONINIHS 27* 22*   Uncontrolled hypertension Initial blood pressure was over 200. PTA meds- carvedilol 12.5 mg twice daily, clonidine 0.1 mg twice daily, amlodipine 5 mg daily, irbesartan 150 mg daily, torsemide 100 mg twice daily Currently continued on all.  Chronic anemia GERD Hemoglobin close to 9 at baseline. Continue Protonix Recent Labs    03/06/23 0836 03/17/23 1002 03/18/23 0404 03/31/23 0903 04/01/23 0535  HGB 7.8* 9.6* 8.2* 9.3* 9.6*  MCV 91.4 93.9 92.3 95.8 93.4   Paraplegia  S/p suprapubic catheter, h/o UTI pressure ulcers, osteomyelitis Chronic pain Supportive care PTA pain meds -Flexeril as needed  Seizure disorder Continue Keppra 1000 mg nightly Chronic pain  Mobility: Paraplegic.  Bedbound.  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 (Active)  Date First Assessed/Time First Assessed: 02/26/22 0120   Location: Ischial tuberosity  Location Orientation: Left  Staging: Stage 4 - Full thickness tissue loss with exposed bone, tendon or muscle.   Present on Admission: Yes    Assessments 02/26/2022  2:12 AM 04/01/2023 10:00 AM  Dressing Type Moist to dry --  Dressing Changed --  State of Healing Early/partial granulation --  Site / Wound Assessment Pale;Pink --  % Wound base Red or Granulating -- 100%  Peri-wound Assessment Pink --  Wound Length (cm) 2 cm 2 cm  Wound Width (cm) 2.7 cm 2 cm  Wound Depth (cm) 2 cm 0.4 cm  Wound Surface Area (cm^2) 5.4 cm^2 4 cm^2  Wound Volume (cm^3) 10.8 cm^3 1.6 cm^3  Margins Unattached edges (unapproximated) --  Drainage Amount Moderate --  Drainage Description Serous --  Treatment Cleansed --     No associated orders.     Pressure Injury 02/26/22 Ischial tuberosity Right Stage 4 - Full thickness tissue loss with exposed bone, tendon or muscle. (Active)  Date First Assessed/Time First Assessed: 02/26/22 0120   Location: Ischial tuberosity  Location Orientation: Right  Staging: Stage 4 - Full thickness tissue loss with exposed bone, tendon or muscle.  Present on Admission: Yes    Assessments 02/26/2022  2:13 AM 04/01/2023 10:00 AM  Dressing Type Moist to dry --  Dressing Changed --  State of Healing Fully granulated --  Site / Wound Assessment Pink;Pale --  % Wound base Red or Granulating -- 100%  Wound Length (cm) 4.5 cm 1 cm  Wound Width (cm) 2.5 cm 1 cm  Wound Depth (cm) 6 cm 0.3 cm  Wound Surface Area (cm^2) 11.25 cm^2 1 cm^2  Wound Volume (cm^3) 67.5 cm^3 0.3 cm^3  Margins Unattached edges (unapproximated) --  Drainage Amount Moderate --  Drainage Description Serous --  Treatment Cleansed --     No associated orders.     Wound / Incision (Open or Dehisced) 07/24/22 Other (Comment) Ischial tuberosity Left Stage IV 2x2 cm with 1.5cm depth small amount of serous exudate (Active)  Date First Assessed/Time First Assessed: 07/24/22 2239   Wound Type: Other (Comment)  Location: Ischial tuberosity  Location Orientation: Left  Wound Description (Comments): Stage IV 2x2 cm with 1.5cm depth  small amount of serous exudate  Present on A...    Assessments 07/24/2022  4:34 PM 11/11/2022 11:49 PM  Dressing Type Moist to dry Foam - Lift dressing to assess site every shift  Dressing Changed Changed Reinforced  Dressing Status Clean, Dry, Intact Clean, Dry, Intact  Site / Wound Assessment Clean;Dry --  % Wound base Red or Granulating 95% --  % Wound base Yellow/Fibrinous Exudate 5% --  % Wound base Black/Eschar 0% --  Peri-wound Assessment Intact;Pink --  Wound Length (cm) 2 cm --  Wound Width (cm) 2 cm --  Wound Depth (cm) 1.5 cm --  Wound Volume (cm^3) 6 cm^3 --  Wound Surface Area (cm^2) 4 cm^2 --  Tunneling (cm) 1.5 --  Margins Attached edges (approximated) --  Drainage Amount Minimal --  Drainage Description Serous --  Treatment Cleansed;Packing (Saline gauze) --     No associated orders.    Discharge Exam:   Vitals:   04/01/23 0700 04/01/23 0811 04/01/23 1148 04/01/23 1554  BP: (!) 170/81 (!) 183/88 (!) 169/94 (!) 141/80  Pulse: 81 83 83 80  Resp: (!) 24 18 19 17   Temp:  98.3 F (36.8 C) 98.1  F (36.7 C) 97.6 F (36.4 C)  TempSrc:  Oral Oral Oral  SpO2: 97% 94% 95% 95%  Weight:        Body mass index is 21.21 kg/m.  General exam: Pleasant, middle-aged female.  Not in distress Skin: No rashes, lesions or ulcers. HEENT: Atraumatic, normocephalic, no obvious bleeding Lungs: Clear to auscultation bilaterally, CVS: Regular rate and rhythm, no murmur GI/Abd soft, nontender, nondistended, bowel sound present CNS: Alert, awake, oriented x 3 Psychiatry: Mood appropriate. Extremities: Atrophic from paraplegia years.  No pedal edema.  Follow ups:    Follow-up Information     [facility] Follow up.   Why: Someone will call you to schedule resumption of care visit. Contact information: [address] [place] [address]  [phone]        Richarda Osmond, FNP Follow up.   Specialty: Internal Medicine Contact  information: [phone] [address] [facility] [place] [address] [phone]         Little Ishikawa, MD. Go today.   Specialties: Cardiology, Radiology Why: 11/12 @8am  ([facility] office) Contact information: [address] [place] [address] [phone]                 Discharge Instructions:   Discharge Instructions     Call MD for:  difficulty breathing, headache or visual disturbances   Complete by: As directed    Call MD for:  extreme fatigue   Complete by: As directed    Call MD for:  hives   Complete by: As directed    Call MD for:  persistant dizziness or light-headedness   Complete by: As directed    Call MD for:  persistant nausea and vomiting   Complete by: As directed    Call MD for:  severe uncontrolled pain   Complete by: As directed    Call MD for:  temperature >100.4   Complete by: As directed    Diet general   Complete by: As directed    Dialysis diet   Discharge instructions   Complete by: As directed    Recommendations at discharge:   Ensure compliance with your medications and dialysis.  Cardiology appointment with Dr. Bjorn Pippin 11/12 @8am  ([facility] office)   General discharge instructions: Follow with Primary MD [facility] [name], FNP in 7 days  Please request your PCP  to go over your hospital tests, procedures, radiology results at the follow up. Please get your medicines reviewed and adjusted.  Your PCP may decide to repeat certain labs or tests as needed. Do not drive, operate heavy machinery, perform activities at heights, swimming or participation in water activities or provide baby sitting services if your were admitted for syncope or siezures until you have seen by Primary MD or a Neurologist and advised to do so again. [place] Controlled Substance Reporting System database was reviewed. Do not drive, operate heavy machinery, perform activities at heights, swim,  participate in water activities or provide baby-sitting services while on medications for pain, sleep and mood until your outpatient physician has reevaluated you and advised to do so again.  You are strongly recommended to comply with the dose, frequency and duration of prescribed medications. Activity: As tolerated with Full fall precautions use walker/cane & assistance as needed Avoid using any recreational substances like cigarette, tobacco, alcohol, or non-prescribed drug. If you experience worsening of your admission symptoms, develop shortness of breath, life threatening emergency, suicidal or  homicidal thoughts you must seek medical attention immediately by calling 911 or calling your MD immediately  if symptoms less severe. You must read complete instructions/literature along with all the possible adverse reactions/side effects for all the medicines you take and that have been prescribed to you. Take any new medicine only after you have completely understood and accepted all the possible adverse reactions/side effects.  Wear Seat belts while driving. You were cared for by a hospitalist during your hospital stay. If you have any questions about your discharge medications or the care you received while you were in the hospital after you are discharged, you can call the unit and ask to speak with the hospitalist or the covering physician. Once you are discharged, your primary care physician will handle any further medical issues. Please note that NO REFILLS for any discharge medications will be authorized once you are discharged, as it is imperative that you return to your primary care physician (or establish a relationship with a primary care physician if you do not have one).   Discharge wound care:   Complete by: As directed    Increase activity slowly   Complete by: As directed        Discharge Medications:   Allergies as of 04/01/2023       Reactions   Benadryl [diphenhydramine Hcl  (sleep)] Hives   Daptomycin Hives   Linezolid    Other Reaction(s): GI Intolerance Patient self-discontinued treatment due to GI intolerance. Taking it along with moxifloxacin   Moxifloxacin    Other Reaction(s): GI Intolerance Patient self-discontinued treatment due to GI intolerance. Taking it along with linezolid   Quinine Derivatives Other (See Comments)   Alters mental status   Vancomycin    Pt is tolerating this medication at HD   Azithromycin Itching, Rash   Tetracycline Itching   Able to tolerate Doxycycline.    Zosyn [piperacillin Sod-tazobactam So] Rash        Medication List     TAKE these medications    acetaminophen 500 MG tablet Commonly known as: TYLENOL Take 1,000 mg by mouth every 6 (six) hours as needed for moderate pain.   albuterol 108 (90 Base) MCG/ACT inhaler Commonly known as: VENTOLIN HFA Inhale 2 puffs into the lungs every 4 (four) hours as needed for wheezing or shortness of breath.   amLODipine 5 MG tablet Commonly known as: NORVASC Take 1 tablet (5 mg total) by mouth daily. Take 1 tablet on Monday,Wednesday and Friday then take 2 tablets on Tuesday,Thursday,Saturday and Sunday.   carvedilol 12.5 MG tablet Commonly known as: COREG Take 12.5 mg by mouth 2 (two) times daily with a meal.   cloNIDine 0.1 MG tablet Commonly known as: CATAPRES Take 1 tablet (0.1 mg total) by mouth 2 (two) times daily.   cyclobenzaprine 10 MG tablet Commonly known as: FLEXERIL Take 10 mg by mouth 3 (three) times daily.   irbesartan 150 MG tablet Commonly known as: AVAPRO Take 150 mg by mouth daily at 6 PM.   levETIRAcetam 500 MG tablet Commonly known as: KEPPRA Take 1,000 mg by mouth every evening. Take an additional tablet on Monday,Wednesday and Friday evening after Dialysis   lidocaine-prilocaine cream Commonly known as: EMLA Apply 1 Application topically 3 (three) times a week.   naloxone 4 MG/0.1ML Liqd nasal spray kit Commonly known as:  NARCAN Place 0.4 mg into the nose once.   pantoprazole 40 MG tablet Commonly known as: PROTONIX Take 40 mg by mouth daily.  senna-docusate 8.6-50 MG tablet Commonly known as: Senokot-S Take 2 tablets by mouth at bedtime.   sevelamer carbonate 800 MG tablet Commonly known as: RENVELA Take 800 mg by mouth 3 (three) times daily.   torsemide 100 MG tablet Commonly known as: DEMADEX Take 1 tablet (100 mg total) by mouth 2 (two) times daily.               Discharge Care Instructions  (From admission, onward)           Start     Ordered   04/01/23 0000  Discharge wound care:        04/01/23 1716             The results of significant diagnostics from this hospitalization (including imaging, microbiology, ancillary and laboratory) are listed below for reference.    Procedures and Diagnostic Studies:   ECHOCARDIOGRAM COMPLETE  Result Date: 04/01/2023    ECHOCARDIOGRAM REPORT   Patient Name:   [name] Date of Exam: 04/01/2023 Medical Rec #:  [id number]        Height:       62.0 in Accession #:    [id number]       Weight:       116.0 lb Date of Birth:  10-24-1965         BSA:          1.516 m Patient Age:    57 years         BP:           169/94 mmHg Patient Gender: F                HR:           79 bpm. Exam Location:  Inpatient Procedure: 2D Echo, Cardiac Doppler and Color Doppler Indications:    Abnormal ECG  History:        Patient has prior history of Echocardiogram examinations, most                 recent 11/09/2022. ESRD; Risk Factors:Hypertension and                 Non-Smoker.  Sonographer:    Dondra Prader RVT RCS Referring Phys: Lorin Glass IMPRESSIONS  1. Left ventricular ejection fraction, by estimation, is 45 to 50%. The left ventricle has mildly decreased function. The left ventricle demonstrates global hypokinesis. There is mild concentric left ventricular hypertrophy. Left ventricular diastolic parameters are consistent with Grade II diastolic  dysfunction (pseudonormalization).  2. Right ventricular systolic function is mildly reduced. The right ventricular size is mildly enlarged. There is normal pulmonary artery systolic pressure. The estimated right ventricular systolic pressure is 28.2 mmHg.  3. Left atrial size was mildly dilated.  4. Right atrial size was mildly dilated.  5. The mitral valve is grossly normal. Mild mitral valve regurgitation. No evidence of mitral stenosis.  6. The aortic valve is tricuspid. Aortic valve regurgitation is not visualized. No aortic stenosis is present.  7. The inferior vena cava is normal in size with greater than 50% respiratory variability, suggesting right atrial pressure of 3 mmHg. Comparison(s): No significant change from prior study. FINDINGS  Left Ventricle: Left ventricular ejection fraction, by estimation, is 45 to 50%. The left ventricle has mildly decreased function. The left ventricle demonstrates global hypokinesis. The left ventricular internal cavity size was normal in size. There is  mild concentric left ventricular hypertrophy. Left ventricular diastolic parameters are consistent with Grade II  diastolic dysfunction (pseudonormalization). Right Ventricle: The right ventricular size is mildly enlarged. No increase in right ventricular wall thickness. Right ventricular systolic function is mildly reduced. There is normal pulmonary artery systolic pressure. The tricuspid regurgitant velocity  is 2.51 m/s, and with an assumed right atrial pressure of 3 mmHg, the estimated right ventricular systolic pressure is 28.2 mmHg. Left Atrium: Left atrial size was mildly dilated. Right Atrium: Right atrial size was mildly dilated. Pericardium: Trivial pericardial effusion is present. Mitral Valve: The mitral valve is grossly normal. Mild mitral valve regurgitation. No evidence of mitral valve stenosis. Tricuspid Valve: The tricuspid valve is grossly normal. Tricuspid valve regurgitation is mild . No evidence of  tricuspid stenosis. Aortic Valve: The aortic valve is tricuspid. Aortic valve regurgitation is not visualized. No aortic stenosis is present. Aortic valve mean gradient measures 3.0 mmHg. Aortic valve peak gradient measures 5.5 mmHg. Aortic valve area, by VTI measures 3.29 cm. Pulmonic Valve: The pulmonic valve was grossly normal. Pulmonic valve regurgitation is not visualized. No evidence of pulmonic stenosis. Aorta: The aortic root and ascending aorta are structurally normal, with no evidence of dilitation. Venous: The inferior vena cava is normal in size with greater than 50% respiratory variability, suggesting right atrial pressure of 3 mmHg. IAS/Shunts: The atrial septum is grossly normal.  LEFT VENTRICLE PLAX 2D LVIDd:         5.20 cm   Diastology LVIDs:         3.40 cm   LV e' medial:    3.38 cm/s LV PW:         1.10 cm   LV E/e' medial:  31.1 LV IVS:        1.10 cm   LV e' lateral:   5.06 cm/s LVOT diam:     2.10 cm   LV E/e' lateral: 20.8 LV SV:         72 LV SV Index:   48 LVOT Area:     3.46 cm  RIGHT VENTRICLE             IVC RV S prime:     11.70 cm/s  IVC diam: 1.50 cm TAPSE (M-mode): 2.5 cm LEFT ATRIUM             Index        RIGHT ATRIUM           Index LA diam:        3.50 cm 2.31 cm/m   RA Area:     15.80 cm LA Vol (A2C):   39.2 ml 25.82 ml/m  RA Volume:   52.00 ml  34.29 ml/m LA Vol (A4C):   54.5 ml 35.94 ml/m LA Biplane Vol: 53.7 ml 35.41 ml/m  AORTIC VALVE                    PULMONIC VALVE AV Area (Vmax):    3.32 cm     PV Vmax:       0.70 m/s AV Area (Vmean):   3.27 cm     PV Peak grad:  2.0 mmHg AV Area (VTI):     3.29 cm AV Vmax:           117.00 cm/s AV Vmean:          80.800 cm/s AV VTI:            0.220 m AV Peak Grad:      5.5 mmHg AV Mean Grad:  3.0 mmHg LVOT Vmax:         112.00 cm/s LVOT Vmean:        76.400 cm/s LVOT VTI:          0.209 m LVOT/AV VTI ratio: 0.95  AORTA Ao Root diam: 3.60 cm Ao Asc diam:  3.40 cm MITRAL VALVE                TRICUSPID VALVE MV Area  (PHT): 4.89 cm     TR Peak grad:   25.2 mmHg MV Decel Time: 155 msec     TR Vmax:        251.00 cm/s MV E velocity: 105.00 cm/s MV A velocity: 87.00 cm/s   SHUNTS MV E/A ratio:  1.21         Systemic VTI:  0.21 m                             Systemic Diam: 2.10 cm Lennie Odor MD Electronically signed by Lennie Odor MD Signature Date/Time: 04/01/2023/4:14:42 PM    Final    DG Chest Port 1 View  Result Date: 03/31/2023 CLINICAL DATA:  Shortness of breath. EXAM: PORTABLE CHEST 1 VIEW COMPARISON:  March 17, 2023. FINDINGS: Stable cardiomegaly. Status post fusion of thoracic spine. Bibasilar opacities are noted concerning for edema or atelectasis with associated pleural effusions. IMPRESSION: Stable bibasilar edema or atelectasis is noted with associated pleural effusions. Electronically Signed   By: Lupita Raider M.D.   On: 03/31/2023 12:50     Labs:   Basic Metabolic Panel: Recent Labs  Lab 03/31/23 0903 04/01/23 0521 04/01/23 0535  NA 135  --  132*  K 5.3*  --  4.7  CL 99  --  97*  CO2 21*  --  24  GLUCOSE 104*  --  76  BUN 67*  --  34*  CREATININE 5.55*  --  3.54*  CALCIUM 8.8*  --  8.5*  PHOS  --  4.2  --    GFR Estimated Creatinine Clearance: 14.2 mL/min (A) (by C-G formula based on SCr of 3.54 mg/dL (H)). Liver Function Tests: No results for input(s): "AST", "ALT", "ALKPHOS", "BILITOT", "PROT", "ALBUMIN" in the last 168 hours. No results for input(s): "LIPASE", "AMYLASE" in the last 168 hours. No results for input(s): "AMMONIA" in the last 168 hours. Coagulation profile No results for input(s): "INR", "PROTIME" in the last 168 hours.  CBC: Recent Labs  Lab 03/31/23 0903 04/01/23 0535  WBC 6.1 6.4  NEUTROABS 4.3  --   HGB 9.3* 9.6*  HCT 32.2* 32.6*  MCV 95.8 93.4  PLT 169 176   Cardiac Enzymes: No results for input(s): "CKTOTAL", "CKMB", "CKMBINDEX", "TROPONINI" in the last 168 hours. BNP: Invalid input(s): "POCBNP" CBG: Recent Labs  Lab 04/01/23 1128   GLUCAP 111*   D-Dimer No results for input(s): "DDIMER" in the last 72 hours. Hgb A1c No results for input(s): "HGBA1C" in the last 72 hours. Lipid Profile No results for input(s): "CHOL", "HDL", "LDLCALC", "TRIG", "CHOLHDL", "LDLDIRECT" in the last 72 hours. Thyroid function studies No results for input(s): "TSH", "T4TOTAL", "T3FREE", "THYROIDAB" in the last 72 hours.  Invalid input(s): "FREET3" Anemia work up No results for input(s): "VITAMINB12", "FOLATE", "FERRITIN", "TIBC", "IRON", "RETICCTPCT" in the last 72 hours. Microbiology No results found for this or any previous visit (from the past 240 hour(s)).  Time coordinating discharge: 45 minutes  Signed: Lorin Glass  [facility]  04/01/2023, 5:16 PM

## 2023-04-01 NOTE — H&P (Signed)
History and Physical    Patient: [name] UJW:[id number] DOB: Aug 31, 1965 DOA: 03/31/2023 DOS: the patient was seen and examined on 04/01/2023 PCP: Richarda Osmond, FNP  Patient coming from: Home  Chief Complaint:  Chief Complaint  Patient presents with   Shortness of Breath   Chest Pain   HPI: [name] is a 57 y.o. female with medical history significant  For paraplegia with suprapubic catheter, seizure disorder, end-stage renal disease on HD MWF, hypertension, pressure ulcers and osteomyelitis who called 911 this morning because she was having difficulty catching her breath.  The patient says she always gets winded when she has to get dressed.  This morning however she was really struggling to breathe she also had severe chest pains all of this scared her.  So she called 911.  Her she had a hemodialysis 3 days ago and it was unremarkable.  She feels that yesterday her breathing was at baseline also.  She is not sure why she got so short of breath this morning but she could not make it to dialysis and came to the emergency room instead..   In the emergency department she was evaluated and nephrology was consulted.  The patient had hemodialysis.  Afterwards the patient said her chest pain was gone but she continued to feel more short of breath than usual particularly at rest.  After dialysis she was still having trouble completing full sentences.  She was still tachypneic and she still required 2 L O2 nasal cannula.  The ED physicians have asked [place] to admit for ongoing hypoxia and shortness of breath.   Review of Systems: As mentioned in the history of present illness. All other systems reviewed and are negative. Past Medical History:  Diagnosis Date   Abnormal uterine bleeding (AUB) 06/15/2014   Cancer (HCC)    uterine   High blood pressure    Paraplegia (lower)    Renal disorder    Seizure disorder (HCC)    Seizures (HCC)    Suprapubic catheter (HCC)    Urinary  tract infection    Past Surgical History:  Procedure Laterality Date   APPLICATION OF WOUND VAC Right 09/13/2021   (approximately 1-16mos ago) pressure sore on right hip   BACK SURGERY     Pt stated "before 2000"   BIOPSY  12/03/2022   Procedure: BIOPSY;  Surgeon: Dolores Frame, MD;  [facility];  Service: Gastroenterology;;   ESOPHAGOGASTRODUODENOSCOPY N/A 09/20/2015   Procedure: ESOPHAGOGASTRODUODENOSCOPY (EGD);  Surgeon: Malissa Hippo, MD;  [facility];  Service: Endoscopy;  Laterality: N/A;  730   ESOPHAGOGASTRODUODENOSCOPY (EGD) WITH PROPOFOL N/A 12/03/2022   Procedure: ESOPHAGOGASTRODUODENOSCOPY (EGD) WITH PROPOFOL;  Surgeon: Dolores Frame, MD;  [facility];  Service: Gastroenterology;  Laterality: N/A;  1:15 pm, asa 3, pt knows to arrive at 10:30  dialysis pt, M,W & F   IR CATHETER TUBE CHANGE  04/02/2018   PERCUTANEOUS ENDOSCOPIC GASTROSTOMY (PEG) REMOVAL N/A 09/20/2015   Procedure: PERCUTANEOUS ENDOSCOPIC GASTROSTOMY (PEG) REMOVAL;  Surgeon: Malissa Hippo, MD;  [facility];  Service: Endoscopy;  Laterality: N/A;   TEE WITHOUT CARDIOVERSION N/A 11/11/2022   Procedure: TRANSESOPHAGEAL ECHOCARDIOGRAM (TEE);  Surgeon: Pricilla Riffle, MD;  Location: [facility];  Service: Cardiovascular;  Laterality: N/A;   Social History:  reports that she has never smoked. She has never used smokeless tobacco. She reports current alcohol use. She reports that she does not use drugs.  Allergies  Allergen  Reactions   Benadryl [Diphenhydramine Hcl (Sleep)] Hives   Daptomycin Hives   Linezolid     Other Reaction(s): GI Intolerance  Patient self-discontinued treatment due to GI intolerance. Taking it along with moxifloxacin   Moxifloxacin     Other Reaction(s): GI Intolerance  Patient self-discontinued treatment due to GI intolerance. Taking it along with linezolid   Quinine Derivatives Other (See Comments)    Alters mental status    Vancomycin     Pt is tolerating this medication at HD   Azithromycin Itching and Rash   Tetracycline Itching    Able to tolerate Doxycycline.    Zosyn [Piperacillin Sod-Tazobactam So] Rash    Family History  Problem Relation Age of Onset   Cancer Mother    Hypertension Mother    Cancer Sister        breast and then spread everywhere.   Diabetes Paternal Grandmother    Hypertension Paternal Grandmother     Prior to Admission medications   Medication Sig Start Date End Date Taking? Authorizing Provider  acetaminophen (TYLENOL) 500 MG tablet Take 1,000 mg by mouth every 6 (six) hours as needed for moderate pain.   Yes [provider]  albuterol (VENTOLIN HFA) 108 (90 Base) MCG/ACT inhaler Inhale 2 puffs into the lungs every 4 (four) hours as needed for wheezing or shortness of breath. 11/12/22  Yes Emokpae, Courage, MD  amLODipine (NORVASC) 5 MG tablet Take 1 tablet (5 mg total) by mouth daily. Take 1 tablet on Monday,Wednesday and Friday then take 2 tablets on Tuesday,Thursday,Saturday and Sunday. 03/18/23  Yes Sherryll Burger, Pratik D, DO  carvedilol (COREG) 12.5 MG tablet Take 12.5 mg by mouth 2 (two) times daily with a meal. 12/24/22 03/31/23 Yes [provider]  cloNIDine (CATAPRES) 0.1 MG tablet Take 1 tablet (0.1 mg total) by mouth 2 (two) times daily. 11/12/22  Yes Emokpae, Courage, MD  cyclobenzaprine (FLEXERIL) 10 MG tablet Take 10 mg by mouth 3 (three) times daily. 01/29/23  Yes [provider]  irbesartan (AVAPRO) 150 MG tablet Take 150 mg by mouth daily at 6 PM.   Yes [provider]  levETIRAcetam (KEPPRA) 500 MG tablet Take 1,000 mg by mouth every evening. Take an additional tablet on Monday,Wednesday and Friday evening after Dialysis 08/09/21  Yes [provider]  lidocaine-prilocaine (EMLA) cream Apply 1 Application topically 3 (three) times a week. 06/04/22  Yes [provider]  naloxone (NARCAN) nasal spray 4 mg/0.1 mL Place 0.4 mg  into the nose once. 10/03/22  Yes [provider]  pantoprazole (PROTONIX) 40 MG tablet Take 40 mg by mouth daily. 08/03/22  Yes [provider]  senna-docusate (SENOKOT-S) 8.6-50 MG tablet Take 2 tablets by mouth at bedtime. 11/12/22  Yes Emokpae, Courage, MD  sevelamer carbonate (RENVELA) 800 MG tablet Take 800 mg by mouth 3 (three) times daily. 12/11/22  Yes [provider]  torsemide (DEMADEX) 100 MG tablet Take 1 tablet (100 mg total) by mouth 2 (two) times daily. 03/06/23 04/05/23 Yes Kendell Bane, MD    Physical Exam: Vitals:   03/31/23 2215 03/31/23 2230 03/31/23 2245 03/31/23 2300  BP: (!) 172/81 (!) 172/85 (!) 166/82 (!) 168/81  Pulse: 86 81 77 79  Resp: (!) 24 (!) 26 (!) 27 (!) 25  Temp:      TempSrc:      SpO2: 95% 93% 97% 97%  Weight:       Physical Exam:  General: No acute distress, frail, malnourished HEENT: Normocephalic,  atraumatic, PERRL Cardiovascular: Normal rate and rhythm. Distal pulses intact. Pulmonary: Prominent Rales in the bases bilaterally,  Gastrointestinal: Distended abdomen,  mild tenderness diffusely, normoactive bowel sounds Musculoskeletal: Contractured lower extremities, no lower ext edema Skin: Skin is warm and dry.  Sacral area was not examined Neuro: No focal deficits noted, AAOx3. PSYCH: Attentive and cooperative  Data Reviewed:  Results for orders placed or performed during the hospital encounter of 03/31/23 (from the past 24 hour(s))  Basic metabolic panel     Status: Abnormal   Collection Time: 03/31/23  9:03 AM  Result Value Ref Range   Sodium 135 135 - 145 mmol/L   Potassium 5.3 (H) 3.5 - 5.1 mmol/L   Chloride 99 98 - 111 mmol/L   CO2 21 (L) 22 - 32 mmol/L   Glucose, Bld 104 (H) 70 - 99 mg/dL   BUN 67 (H) 6 - 20 mg/dL   Creatinine, Ser 4.09 (H) 0.44 - 1.00 mg/dL   Calcium 8.8 (L) 8.9 - 10.3 mg/dL   GFR, Estimated 9 (L) >60 mL/min   Anion gap 15 5 - 15  CBC with Differential     Status: Abnormal    Collection Time: 03/31/23  9:03 AM  Result Value Ref Range   WBC 6.1 4.0 - 10.5 K/uL   RBC 3.36 (L) 3.87 - 5.11 MIL/uL   Hemoglobin 9.3 (L) 12.0 - 15.0 g/dL   HCT 81.1 (L) 91.4 - 78.2 %   MCV 95.8 80.0 - 100.0 fL   MCH 27.7 26.0 - 34.0 pg   MCHC 28.9 (L) 30.0 - 36.0 g/dL   RDW 95.6 (H) 21.3 - 08.6 %   Platelets 169 150 - 400 K/uL   nRBC 0.0 0.0 - 0.2 %   Neutrophils Relative % 71 %   Neutro Abs 4.3 1.7 - 7.7 K/uL   Lymphocytes Relative 18 %   Lymphs Abs 1.1 0.7 - 4.0 K/uL   Monocytes Relative 5 %   Monocytes Absolute 0.3 0.1 - 1.0 K/uL   Eosinophils Relative 5 %   Eosinophils Absolute 0.3 0.0 - 0.5 K/uL   Basophils Relative 1 %   Basophils Absolute 0.1 0.0 - 0.1 K/uL   WBC Morphology MORPHOLOGY UNREMARKABLE    RBC Morphology MORPHOLOGY UNREMARKABLE    Smear Review Normal platelet morphology    Immature Granulocytes 0 %   Abs Immature Granulocytes 0.02 0.00 - 0.07 K/uL  Troponin I (High Sensitivity)     Status: Abnormal   Collection Time: 03/31/23  9:03 AM  Result Value Ref Range   Troponin I (High Sensitivity) 27 (H) <18 ng/L  Troponin I (High Sensitivity)     Status: Abnormal   Collection Time: 03/31/23  2:26 PM  Result Value Ref Range   Troponin I (High Sensitivity) 22 (H) <18 ng/L  Hepatitis B surface antigen     Status: None   Collection Time: 03/31/23  5:34 PM  Result Value Ref Range   Hepatitis B Surface Ag NON REACTIVE NON REACTIVE     Assessment and Plan: Shortness of breath with hypoxic respiratory failure requiring 2 L O2 nasal cannula even after HD.  Continue oxygen as needed. Have nephrology evaluate the patient and her volume status in the a.m. She has Rales on examination and her chest x-ray revealed edema and bilateral pleural effusions so the evidence points to volume overload.  If she continues to be short of breath after dialysis other causes such as PE could be considered. Paraplegia with suprapubic catheter -  Continue Flexeril for muscle spasms H/o  Seizure - continue outpatient medications    Advance Care Planning:   Code Status: Full Code the patient names her daughter [name] is her surrogate decision maker and wants to be full code.  Consults: Nephrology  Family Communication: None  Severity of Illness: The appropriate patient status for this patient is INPATIENT. Inpatient status is judged to be reasonable and necessary in order to provide the required intensity of service to ensure the patient's safety. The patient's presenting symptoms, physical exam findings, and initial radiographic and laboratory data in the context of their chronic comorbidities is felt to place them at high risk for further clinical deterioration. Furthermore, it is not anticipated that the patient will be medically stable for discharge from the hospital within 2 midnights of admission.   * I certify that at the point of admission it is my clinical judgment that the patient will require inpatient hospital care spanning beyond 2 midnights from the point of admission due to high intensity of service, high risk for further deterioration and high frequency of surveillance required.*  Author: Buena Irish, MD 04/01/2023 12:24 AM  For on call review [email or web address].

## 2023-04-01 NOTE — Progress Notes (Signed)
Patient ID: [name], female   DOB: [birth year], 57 y.o.   MRN: [id number] [facility] Progress Note   Assessment/ Plan:   1.  Acute hypoxic respiratory failure: This appears to be from pulmonary edema secondary to volume excess in patient with ESRD +/- uncontrolled hypertension.  Underwent hemodialysis/ultrafiltration yesterday with improvement of respiratory status 2. ESRD: Usually on MWF dialysis schedule at the [facility] unit in [place].  Appears much higher than her documented EDW and will plan to undertake hemodialysis again tomorrow. 3. Anemia: Marginally depressed hemoglobin and hematocrit, will defer ESA until outpatient dialysis unit.  No indication for PRBC. 4. CKD-MBD: Calcium level acceptable await phosphorus level.  Resume sevelamer with meals as indicated. 5. Nutrition: Continue renal diet with fluid restriction and monitor albumin level. 6. Hypertension: Blood pressure significantly elevated, continue to monitor with resumption of antihypertensive therapy and hemodialysis/ultrafiltration.  Subjective:   Complains of poor sleep overnight and wanting to rest at this time.  Breathing is improved.   Objective:   BP (!) 183/88   Pulse 83   Temp 98.3 F (36.8 C) (Oral)   Resp 18   Wt 52.6 kg   LMP  (LMP Unknown)   SpO2 94%   BMI 21.21 kg/m   Physical Exam: Gen: Appears comfortable resting in bed on room air CVS: Pulse regular rhythm, normal rate, S1 and S2 normal Resp: Fine rales over bilateral lung bases, no rhonchi/wheeze Abd: Soft, flat, nontender Ext: Contracted lower extremities, left upper arm AV fistula with palpable thrill  Labs: BMET Recent Labs  Lab 03/31/23 0903 04/01/23 0535  NA 135 132*  K 5.3* 4.7  CL 99 97*  CO2 21* 24  GLUCOSE 104* 76  BUN 67* 34*  CREATININE 5.55* 3.54*  CALCIUM 8.8* 8.5*   CBC Recent Labs  Lab 03/31/23 0903 04/01/23 0535  WBC 6.1 6.4  NEUTROABS 4.3  --   HGB 9.3* 9.6*  HCT 32.2* 32.6*  MCV 95.8  93.4  PLT 169 176     Medications:     [START ON 04/02/2023] amLODipine  5 mg Oral Q M,W,F   And   amLODipine  10 mg Oral Once per day on Sunday Tuesday Thursday Saturday   carvedilol  12.5 mg Oral BID WC   Chlorhexidine Gluconate Cloth  6 each Topical Q0600   cloNIDine  0.1 mg Oral BID   heparin  5,000 Units Subcutaneous Q8H   irbesartan  150 mg Oral q1800   levETIRAcetam  1,000 mg Oral Daily   And   [START ON 04/02/2023] levETIRAcetam  1,000 mg Oral Q M,W,F-2000   pantoprazole  40 mg Oral Daily   senna-docusate  2 tablet Oral QHS   torsemide  100 mg Oral BID   Zetta Bills, MD 04/01/2023, 10:40 AM

## 2023-04-01 NOTE — Progress Notes (Signed)
D/C order noted. Spoke to [employer] FA to make aware of pt's d/c today and that pt should resume care tomorrow. D/C summary and last renal note faxed to clinic for continuation of care.   Olivia Canter Renal Navigator [phone]

## 2023-04-01 NOTE — ED Notes (Signed)
Provider at bedside

## 2023-04-02 LAB — HEPATITIS B SURFACE ANTIBODY, QUANTITATIVE: Hep B S AB Quant (Post): 312 m[IU]/mL

## 2023-04-06 NOTE — Progress Notes (Deleted)
Cardiology Office Note:    Date:  04/06/2023   ID:  [name], DOB 11/07/1965, MRN [id number]  PCP:  Richarda Osmond, FNP  Cardiologist:  None  Electrophysiologist:  None   Referring MD: Janeann Merl*   No chief complaint on file. ***  History of Present Illness:    [name] is a 57 y.o. female with a hx of ESRD, paraplegia, osteomyelitis, seizures, hypertension who is referred for evaluation of systolic heart failure.  She was admitted 11/4 through 04/01/2023 with shortness of breath and chest pain.  Chest x-ray showed pulmonary edema.  Troponins in 20s.  She improved with dialysis.  Echocardiogram showed EF 45 to 50%.  Past Medical History:  Diagnosis Date   Abnormal uterine bleeding (AUB) 06/15/2014   Cancer (HCC)    uterine   High blood pressure    Paraplegia (lower)    Renal disorder    Seizure disorder (HCC)    Seizures (HCC)    Suprapubic catheter (HCC)    Urinary tract infection     Past Surgical History:  Procedure Laterality Date   APPLICATION OF WOUND VAC Right 09/13/2021   (approximately 1-69mos ago) pressure sore on right hip   BACK SURGERY     Pt stated "before 2000"   BIOPSY  12/03/2022   Procedure: BIOPSY;  Surgeon: Dolores Frame, MD;  [facility];  Service: Gastroenterology;;   ESOPHAGOGASTRODUODENOSCOPY N/A 09/20/2015   Procedure: ESOPHAGOGASTRODUODENOSCOPY (EGD);  Surgeon: Malissa Hippo, MD;  [facility];  Service: Endoscopy;  Laterality: N/A;  730   ESOPHAGOGASTRODUODENOSCOPY (EGD) WITH PROPOFOL N/A 12/03/2022   Procedure: ESOPHAGOGASTRODUODENOSCOPY (EGD) WITH PROPOFOL;  Surgeon: Dolores Frame, MD;  [facility];  Service: Gastroenterology;  Laterality: N/A;  1:15 pm, asa 3, pt knows to arrive at 10:30  dialysis pt, M,W & F   IR CATHETER TUBE CHANGE  04/02/2018   PERCUTANEOUS ENDOSCOPIC GASTROSTOMY (PEG) REMOVAL N/A 09/20/2015   Procedure: PERCUTANEOUS  ENDOSCOPIC GASTROSTOMY (PEG) REMOVAL;  Surgeon: Malissa Hippo, MD;  [facility];  Service: Endoscopy;  Laterality: N/A;   TEE WITHOUT CARDIOVERSION N/A 11/11/2022   Procedure: TRANSESOPHAGEAL ECHOCARDIOGRAM (TEE);  Surgeon: Pricilla Riffle, MD;  Location: [facility];  Service: Cardiovascular;  Laterality: N/A;    Current Medications: No outpatient medications have been marked as taking for the 04/08/23 encounter (Appointment) with Little Ishikawa, MD.     Allergies:   Benadryl [diphenhydramine hcl (sleep)], Daptomycin, Linezolid, Moxifloxacin, Quinine derivatives, Vancomycin, Azithromycin, Tetracycline, and Zosyn [piperacillin sod-tazobactam so]   Social History   Socioeconomic History   Marital status: Single    Spouse name: Not on file   Number of children: 3   Years of education: 9 th   Highest education level: Not on file  Occupational History    Comment: Disabled  Tobacco Use   Smoking status: Never   Smokeless tobacco: Never  Vaping Use   Vaping status: Never Used  Substance and Sexual Activity   Alcohol use: Yes    Comment: Occassionally   Drug use: No   Sexual activity: Never  Other Topics Concern   Not on file  Social History Narrative   Patient lives with her son Vicente Serene). Patient is disabled.   Education 9th grade.   Right handed.   Caffeine - None    Social Determinants of Health   Financial Resource Strain: Not on file  Food Insecurity: No Food Insecurity (04/01/2023)   Hunger Vital  Sign    Worried About Programme researcher, broadcasting/film/video in the Last Year: Never true    Ran Out of Food in the Last Year: Never true  Transportation Needs: No Transportation Needs (04/01/2023)   PRAPARE - Administrator, Civil Service (Medical): No    Lack of Transportation (Non-Medical): No  Physical Activity: Not on file  Stress: Not on file  Social Connections: Not on file     Family History: The patient's ***family history includes Cancer in her mother  and sister; Diabetes in her paternal grandmother; Hypertension in her mother and paternal grandmother.  ROS:   Please see the history of present illness.    *** All other systems reviewed and are negative.  EKGs/Labs/Other Studies Reviewed:    The following studies were reviewed today: ***  EKG:  EKG is *** ordered today.  The ekg ordered today demonstrates ***  Recent Labs: 03/05/2023: B Natriuretic Peptide 2,139.0 03/17/2023: ALT 19 03/18/2023: Magnesium 1.8 04/01/2023: BUN 34; Creatinine, Ser 3.54; Hemoglobin 9.6; Platelets 176; Potassium 4.7; Sodium 132  Recent Lipid Panel    Component Value Date/Time   CHOL 384 (H) 05/02/2014 2110   TRIG 412 (H) 05/02/2014 2110   HDL 42 05/02/2014 2110   CHOLHDL 9.1 05/02/2014 2110   VLDL UNABLE TO CALCULATE IF TRIGLYCERIDE OVER 400 mg/dL 57/84/6962 9528   LDLCALC UNABLE TO CALCULATE IF TRIGLYCERIDE OVER 400 mg/dL 41/32/4401 0272    Physical Exam:    VS:  LMP  (LMP Unknown)     Wt Readings from Last 3 Encounters:  03/31/23 115 lb 15.4 oz (52.6 kg)  03/17/23 113 lb 5.1 oz (51.4 kg)  03/06/23 96 lb 1.9 oz (43.6 kg)     GEN: *** Well nourished, well developed in no acute distress HEENT: Normal NECK: No JVD; No carotid bruits LYMPHATICS: No lymphadenopathy CARDIAC: ***RRR, no murmurs, rubs, gallops RESPIRATORY:  Clear to auscultation without rales, wheezing or rhonchi  ABDOMEN: Soft, non-tender, non-distended MUSCULOSKELETAL:  No edema; No deformity  SKIN: Warm and dry NEUROLOGIC:  Alert and oriented x 3 PSYCHIATRIC:  Normal affect   ASSESSMENT:    No diagnosis found. PLAN:    Acute systolic heart failure: Admitted 03/31/2023 with hypertensive urgency, pulmonary edema.  Echocardiogram showed EF 45 to 50%, grade 2 diastolic dysfunction, mild RV dysfunction, mild biatrial enlargement, mild MR. -Stress PET*** -Continue irbesartan, carvedilol  Chest pain: Reported chest pain during admission 03/2023 with acute hypoxic respiratory  failure due to pulmonary edema.  Improved with dialysis.  Echocardiogram with mild systolic dysfunction as above -Stress PET***  Hypertension: BP initially over 200 during admission 03/2023.  On carvedilol 12.5 mg twice daily, clonidine 0.1 mg twice daily, amlodipine 5 mg daily, irbesartan 150 mg daily, torsemide 100 mg twice daily  ESRD: On HD  RTC in***  Medication Adjustments/Labs and Tests Ordered: Current medicines are reviewed at length with the patient today.  Concerns regarding medicines are outlined above.  No orders of the defined types were placed in this encounter.  No orders of the defined types were placed in this encounter.   There are no Patient Instructions on file for this visit.   Signed, Little Ishikawa, MD  04/06/2023 1:40 PM    [facility]

## 2023-04-08 ENCOUNTER — Ambulatory Visit: Payer: 59 | Attending: Cardiology | Admitting: Cardiology

## 2023-04-09 ENCOUNTER — Encounter: Payer: Self-pay | Admitting: Cardiology

## 2023-04-13 ENCOUNTER — Other Ambulatory Visit: Payer: Self-pay

## 2023-04-13 ENCOUNTER — Inpatient Hospital Stay (HOSPITAL_COMMUNITY)
Admission: EM | Admit: 2023-04-13 | Discharge: 2023-04-15 | DRG: 871 | Disposition: A | Discharge status: Home Health | Payer: 59 | Attending: Family Medicine | Admitting: Family Medicine

## 2023-04-13 ENCOUNTER — Emergency Department (HOSPITAL_COMMUNITY): Payer: 59

## 2023-04-13 ENCOUNTER — Encounter (HOSPITAL_COMMUNITY): Payer: Self-pay | Admitting: Emergency Medicine

## 2023-04-13 DIAGNOSIS — D649 Anemia, unspecified: Secondary | ICD-10-CM | POA: Diagnosis present

## 2023-04-13 DIAGNOSIS — J90 Pleural effusion, not elsewhere classified: Secondary | ICD-10-CM | POA: Diagnosis present

## 2023-04-13 DIAGNOSIS — Z1152 Encounter for screening for COVID-19: Secondary | ICD-10-CM

## 2023-04-13 DIAGNOSIS — Z888 Allergy status to other drugs, medicaments and biological substances status: Secondary | ICD-10-CM

## 2023-04-13 DIAGNOSIS — E871 Hypo-osmolality and hyponatremia: Secondary | ICD-10-CM | POA: Diagnosis present

## 2023-04-13 DIAGNOSIS — K219 Gastro-esophageal reflux disease without esophagitis: Secondary | ICD-10-CM | POA: Diagnosis present

## 2023-04-13 DIAGNOSIS — Z79899 Other long term (current) drug therapy: Secondary | ICD-10-CM

## 2023-04-13 DIAGNOSIS — A419 Sepsis, unspecified organism: Secondary | ICD-10-CM | POA: Diagnosis present

## 2023-04-13 DIAGNOSIS — L89324 Pressure ulcer of left buttock, stage 4: Secondary | ICD-10-CM | POA: Diagnosis present

## 2023-04-13 DIAGNOSIS — Z992 Dependence on renal dialysis: Secondary | ICD-10-CM | POA: Diagnosis present

## 2023-04-13 DIAGNOSIS — L89314 Pressure ulcer of right buttock, stage 4: Secondary | ICD-10-CM | POA: Diagnosis present

## 2023-04-13 DIAGNOSIS — Z8249 Family history of ischemic heart disease and other diseases of the circulatory system: Secondary | ICD-10-CM

## 2023-04-13 DIAGNOSIS — J189 Pneumonia, unspecified organism: Secondary | ICD-10-CM | POA: Diagnosis present

## 2023-04-13 DIAGNOSIS — L89304 Pressure ulcer of unspecified buttock, stage 4: Secondary | ICD-10-CM | POA: Diagnosis not present

## 2023-04-13 DIAGNOSIS — R197 Diarrhea, unspecified: Secondary | ICD-10-CM | POA: Diagnosis present

## 2023-04-13 DIAGNOSIS — R262 Difficulty in walking, not elsewhere classified: Secondary | ICD-10-CM | POA: Diagnosis present

## 2023-04-13 DIAGNOSIS — J9811 Atelectasis: Secondary | ICD-10-CM | POA: Diagnosis present

## 2023-04-13 DIAGNOSIS — G40909 Epilepsy, unspecified, not intractable, without status epilepticus: Secondary | ICD-10-CM | POA: Diagnosis present

## 2023-04-13 DIAGNOSIS — Z833 Family history of diabetes mellitus: Secondary | ICD-10-CM | POA: Diagnosis not present

## 2023-04-13 DIAGNOSIS — E1122 Type 2 diabetes mellitus with diabetic chronic kidney disease: Secondary | ICD-10-CM | POA: Diagnosis present

## 2023-04-13 DIAGNOSIS — I120 Hypertensive chronic kidney disease with stage 5 chronic kidney disease or end stage renal disease: Secondary | ICD-10-CM | POA: Diagnosis present

## 2023-04-13 DIAGNOSIS — G8220 Paraplegia, unspecified: Secondary | ICD-10-CM | POA: Diagnosis present

## 2023-04-13 DIAGNOSIS — N186 End stage renal disease: Secondary | ICD-10-CM | POA: Diagnosis present

## 2023-04-13 DIAGNOSIS — J810 Acute pulmonary edema: Secondary | ICD-10-CM | POA: Diagnosis present

## 2023-04-13 DIAGNOSIS — R0689 Other abnormalities of breathing: Secondary | ICD-10-CM | POA: Diagnosis not present

## 2023-04-13 DIAGNOSIS — E8770 Fluid overload, unspecified: Secondary | ICD-10-CM | POA: Diagnosis not present

## 2023-04-13 DIAGNOSIS — Z9359 Other cystostomy status: Secondary | ICD-10-CM

## 2023-04-13 DIAGNOSIS — R0600 Dyspnea, unspecified: Secondary | ICD-10-CM | POA: Diagnosis not present

## 2023-04-13 DIAGNOSIS — N2581 Secondary hyperparathyroidism of renal origin: Secondary | ICD-10-CM | POA: Diagnosis present

## 2023-04-13 DIAGNOSIS — E875 Hyperkalemia: Secondary | ICD-10-CM | POA: Diagnosis present

## 2023-04-13 DIAGNOSIS — L89309 Pressure ulcer of unspecified buttock, unspecified stage: Secondary | ICD-10-CM | POA: Diagnosis present

## 2023-04-13 DIAGNOSIS — A09 Infectious gastroenteritis and colitis, unspecified: Secondary | ICD-10-CM | POA: Diagnosis present

## 2023-04-13 DIAGNOSIS — J811 Chronic pulmonary edema: Secondary | ICD-10-CM | POA: Diagnosis present

## 2023-04-13 DIAGNOSIS — N319 Neuromuscular dysfunction of bladder, unspecified: Secondary | ICD-10-CM | POA: Diagnosis present

## 2023-04-13 DIAGNOSIS — M869 Osteomyelitis, unspecified: Secondary | ICD-10-CM | POA: Diagnosis present

## 2023-04-13 DIAGNOSIS — D631 Anemia in chronic kidney disease: Secondary | ICD-10-CM | POA: Diagnosis present

## 2023-04-13 LAB — COMPREHENSIVE METABOLIC PANEL
ALT: 17 U/L (ref 0–44)
AST: 21 U/L (ref 15–41)
Albumin: 2.6 g/dL — ABNORMAL LOW (ref 3.5–5.0)
Alkaline Phosphatase: 97 U/L (ref 38–126)
Anion gap: 12 (ref 5–15)
BUN: 66 mg/dL — ABNORMAL HIGH (ref 6–20)
CO2: 26 mmol/L (ref 22–32)
Calcium: 8.6 mg/dL — ABNORMAL LOW (ref 8.9–10.3)
Chloride: 95 mmol/L — ABNORMAL LOW (ref 98–111)
Creatinine, Ser: 5.82 mg/dL — ABNORMAL HIGH (ref 0.44–1.00)
GFR, Estimated: 8 mL/min — ABNORMAL LOW (ref >60)
Glucose, Bld: 112 mg/dL — ABNORMAL HIGH (ref 70–99)
Potassium: 5.8 mmol/L — ABNORMAL HIGH (ref 3.5–5.1)
Sodium: 133 mmol/L — ABNORMAL LOW (ref 135–145)
Total Bilirubin: 0.8 mg/dL (ref <1.2)
Total Protein: 7.7 g/dL (ref 6.5–8.1)

## 2023-04-13 LAB — CBC WITH DIFFERENTIAL/PLATELET
Abs Immature Granulocytes: 0.11 10*3/uL — ABNORMAL HIGH (ref 0.00–0.07)
Basophils Absolute: 0 10*3/uL (ref 0.0–0.1)
Basophils Relative: 0 %
Eosinophils Absolute: 0 10*3/uL (ref 0.0–0.5)
Eosinophils Relative: 0 %
HCT: 35.9 % — ABNORMAL LOW (ref 36.0–46.0)
Hemoglobin: 10.5 g/dL — ABNORMAL LOW (ref 12.0–15.0)
Immature Granulocytes: 1 %
Lymphocytes Relative: 6 %
Lymphs Abs: 0.7 10*3/uL (ref 0.7–4.0)
MCH: 28.1 pg (ref 26.0–34.0)
MCHC: 29.2 g/dL — ABNORMAL LOW (ref 30.0–36.0)
MCV: 96 fL (ref 80.0–100.0)
Monocytes Absolute: 0.3 10*3/uL (ref 0.1–1.0)
Monocytes Relative: 2 %
Neutro Abs: 10.7 10*3/uL — ABNORMAL HIGH (ref 1.7–7.7)
Neutrophils Relative %: 91 %
Platelets: 176 10*3/uL (ref 150–400)
RBC: 3.74 MIL/uL — ABNORMAL LOW (ref 3.87–5.11)
RDW: 19.3 % — ABNORMAL HIGH (ref 11.5–15.5)
WBC: 11.8 10*3/uL — ABNORMAL HIGH (ref 4.0–10.5)
nRBC: 0 % (ref 0.0–0.2)

## 2023-04-13 LAB — LACTIC ACID, PLASMA: Lactic Acid, Venous: 0.8 mmol/L (ref 0.5–1.9)

## 2023-04-13 LAB — MAGNESIUM: Magnesium: 2.1 mg/dL (ref 1.7–2.4)

## 2023-04-13 MED ORDER — LEVOFLOXACIN IN D5W 750 MG/150ML IV SOLN
750.0000 mg | Freq: Once | INTRAVENOUS | Status: AC
  Administered 2023-04-13: 750 mg via INTRAVENOUS
  Filled 2023-04-13: qty 150

## 2023-04-13 MED ORDER — ACETAMINOPHEN 325 MG PO TABS
650.0000 mg | ORAL_TABLET | Freq: Once | ORAL | Status: AC
  Administered 2023-04-13: 650 mg via ORAL
  Filled 2023-04-13: qty 2

## 2023-04-13 MED ORDER — HEPARIN SODIUM (PORCINE) 5000 UNIT/ML IJ SOLN
5000.0000 [IU] | Freq: Three times a day (TID) | INTRAMUSCULAR | Status: DC
  Administered 2023-04-13 – 2023-04-15 (×5): 5000 [IU] via SUBCUTANEOUS
  Filled 2023-04-13 (×5): qty 1

## 2023-04-13 MED ORDER — POLYETHYLENE GLYCOL 3350 17 G PO PACK
17.0000 g | PACK | Freq: Every day | ORAL | Status: DC | PRN
Start: 2023-04-13 — End: 2023-04-14

## 2023-04-13 MED ORDER — ONDANSETRON HCL 4 MG PO TABS: 4.0000 mg | ORAL_TABLET | Freq: Four times a day (QID) | ORAL | Status: DC | PRN

## 2023-04-13 MED ORDER — ACETAMINOPHEN 650 MG RE SUPP: 650.0000 mg | Freq: Four times a day (QID) | RECTAL | Status: DC | PRN

## 2023-04-13 MED ORDER — CHLORHEXIDINE GLUCONATE CLOTH 2 % EX PADS
6.0000 | MEDICATED_PAD | Freq: Every day | CUTANEOUS | Status: DC
  Administered 2023-04-14 – 2023-04-15 (×2): 6 via TOPICAL

## 2023-04-13 MED ORDER — ACETAMINOPHEN 325 MG PO TABS
650.0000 mg | ORAL_TABLET | Freq: Four times a day (QID) | ORAL | Status: DC | PRN
  Administered 2023-04-14 – 2023-04-15 (×3): 650 mg via ORAL
  Filled 2023-04-13 (×3): qty 2

## 2023-04-13 MED ORDER — SODIUM ZIRCONIUM CYCLOSILICATE 5 G PO PACK
5.0000 g | PACK | ORAL | Status: AC
  Administered 2023-04-13: 5 g via ORAL
  Filled 2023-04-13: qty 1

## 2023-04-13 MED ORDER — LEVOFLOXACIN IN D5W 250 MG/50ML IV SOLN
250.0000 mg | INTRAVENOUS | Status: DC
  Filled 2023-04-13: qty 50

## 2023-04-13 MED ORDER — LOPERAMIDE HCL 2 MG PO CAPS
4.0000 mg | ORAL_CAPSULE | Freq: Once | ORAL | Status: AC
  Administered 2023-04-13: 4 mg via ORAL
  Filled 2023-04-13: qty 2

## 2023-04-13 MED ORDER — ONDANSETRON HCL 4 MG/2ML IJ SOLN: 4.0000 mg | Freq: Four times a day (QID) | INTRAMUSCULAR | Status: DC | PRN

## 2023-04-13 NOTE — Progress Notes (Signed)
Pharmacy Antibiotic Note  [name] is a 57 y.o. female admitted on 04/13/2023 with pneumonia. PMH significant for ESRD on HD MWF and paraplegia with chronic sacral decubitus ulcers.  Patient presenting with shortness of breath, febrile to 100.9, with WBC of 11.8. Patient unable to tolerate azithromycin or doxycycline. Pharmacy has been consulted for levofloxacin dosing.   Plan: Patient received loading dose of levofloxacin 750 mg IV x1  Start levofloxacin 250 mg IV every 24 hours Monitor clinical status and LOT F/u transition to oral antibiotics as clinically appropriate  Height: 5\' 2"  (157.5 cm) Weight: 52 kg (114 lb 10.2 oz) IBW/kg (Calculated) : 50.1  Temp (24hrs), Avg:99.6 F (37.6 C), Min:98.7 F (37.1 C), Max:100.9 F (38.3 C)  Recent Labs  Lab 04/13/23 1717  WBC 11.8*  CREATININE 5.82*    Estimated Creatinine Clearance: 8.6 mL/min (A) (by C-G formula based on SCr of 5.82 mg/dL (H)).    Allergies  Allergen Reactions   Benadryl [Diphenhydramine Hcl (Sleep)] Hives   Daptomycin Hives   Linezolid     Other Reaction(s): GI Intolerance  Patient self-discontinued treatment due to GI intolerance. Taking it along with moxifloxacin   Moxifloxacin     Other Reaction(s): GI Intolerance  Patient self-discontinued treatment due to GI intolerance. Taking it along with linezolid   Quinine Derivatives Other (See Comments)    Alters mental status   Vancomycin     Pt is tolerating this medication at HD   Azithromycin Itching and Rash   Tetracycline Itching    Able to tolerate Doxycycline.    Zosyn [Piperacillin Sod-Tazobactam So] Rash    Antimicrobials this admission: levofloxacin 11/17 >>   Dose adjustments this admission: N/A  Microbiology results: 11/17 BCx: pending  Thank you for involving pharmacy in this patient's care.   Rockwell Alexandria, PharmD Clinical Pharmacist 04/13/2023 9:25 PM

## 2023-04-13 NOTE — Assessment & Plan Note (Addendum)
Meet sepsis criteria with tachycardia tachypnea, mild leukocytosis of 11.2.  Has bilateral sacral decubitus ulcers, both of them appear clean, with no evidence of surrounding cellulitis or wound infection. -Check lactic acid -Obtain blood cultures -Check UA -Continue Levaquin per pharmacy

## 2023-04-13 NOTE — H&P (Addendum)
History and Physical    [name] XBJ:[id number] DOB: 1966-02-20 DOA: 04/13/2023  PCP: Richarda Osmond, FNP   Patient coming from: Home  I have personally briefly reviewed patient's old medical records in [facility]  Chief Complaint: Difficulty breathing  HPI: [name] is a 57 y.o. female with medical history significant for Paraplegia, suprapubic cath, ESRD, decubitus ulcers.  Patient was brought to the ED with reports of multiple episodes of profuse diarrhea that started after her hemodialysis session 2 days ago 11/15.  Due to paraplegia, she is unable to tell he has abdominal pain.  No vomiting.  She also reports increasing difficulty breathing mostly with exertion over the past few days.  No cough.  Home health nurse follows regularly with her for her chronic decubitus ulcers, and she has been told that that they are healing.  Multiple recent hospitalizations, this is patient's second hospitalization in the past 6 months.  Most Recent - 11/4- 11/5- Acute respiratory failure with hypoxia 2/2 acute pulm edema.  ED Course: Tmax 100.9.  Heart rate 94-99.  Respirate rate 16-26.  Blood pressure systolic 150s to 308M.  O2 sats greater than 92% on room air.  WBC 11.8.  BUN 66.  Potassium 5.8. Chest x-ray shows peribronchial airspace consolidation in the lower lobes, infectious consolidation and atelectasis, also shows interstitial pulmonary edema and small pleural effusion. EDP Dr. Dr. Arlean Hopping, plan for HD in a.m., recommends no IV fluids tonight. Lokelma, 5 mg, Imodium given.  Levaquin started.  Review of Systems: As per HPI all other systems reviewed and negative.  Past Medical History:  Diagnosis Date   Abnormal uterine bleeding (AUB) 06/15/2014   Cancer (HCC)    uterine   High blood pressure    Paraplegia (lower)    Renal disorder    Seizure disorder (HCC)    Seizures (HCC)    Suprapubic catheter (HCC)    Urinary tract infection     Past Surgical  History:  Procedure Laterality Date   APPLICATION OF WOUND VAC Right 09/13/2021   (approximately 1-76mos ago) pressure sore on right hip   BACK SURGERY     Pt stated "before 2000"   BIOPSY  12/03/2022   Procedure: BIOPSY;  Surgeon: Dolores Frame, MD;  [facility]: [facility];  Service: Gastroenterology;;   ESOPHAGOGASTRODUODENOSCOPY N/A 09/20/2015   Procedure: ESOPHAGOGASTRODUODENOSCOPY (EGD);  Surgeon: Malissa Hippo, MD;  Location: [facility];  Service: Endoscopy;  Laterality: N/A;  730   ESOPHAGOGASTRODUODENOSCOPY (EGD) WITH PROPOFOL N/A 12/03/2022   Procedure: ESOPHAGOGASTRODUODENOSCOPY (EGD) WITH PROPOFOL;  Surgeon: Dolores Frame, MD;  Location: [facility];  Service: Gastroenterology;  Laterality: N/A;  1:15 pm, asa 3, pt knows to arrive at 10:30  dialysis pt, M,W & F   IR CATHETER TUBE CHANGE  04/02/2018   PERCUTANEOUS ENDOSCOPIC GASTROSTOMY (PEG) REMOVAL N/A 09/20/2015   Procedure: PERCUTANEOUS ENDOSCOPIC GASTROSTOMY (PEG) REMOVAL;  Surgeon: Malissa Hippo, MD;  Location: [facility];  Service: Endoscopy;  Laterality: N/A;   TEE WITHOUT CARDIOVERSION N/A 11/11/2022   Procedure: TRANSESOPHAGEAL ECHOCARDIOGRAM (TEE);  Surgeon: Pricilla Riffle, MD;  Location: [facility];  Service: Cardiovascular;  Laterality: N/A;     reports that she has never smoked. She has never used smokeless tobacco. She reports current alcohol use. She reports that she does not use drugs.  Allergies  Allergen Reactions   Benadryl [Diphenhydramine Hcl (Sleep)] Hives   Daptomycin Hives   Linezolid     Other Reaction(s): GI  Intolerance  Patient self-discontinued treatment due to GI intolerance. Taking it along with moxifloxacin   Moxifloxacin     Other Reaction(s): GI Intolerance  Patient self-discontinued treatment due to GI intolerance. Taking it along with linezolid   Quinine Derivatives Other (See Comments)    Alters mental status   Vancomycin     Pt is tolerating this  medication at HD   Azithromycin Itching and Rash   Tetracycline Itching    Able to tolerate Doxycycline.    Zosyn [Piperacillin Sod-Tazobactam So] Rash    Family History  Problem Relation Age of Onset   Cancer Mother    Hypertension Mother    Cancer Sister        breast and then spread everywhere.   Diabetes Paternal Grandmother    Hypertension Paternal Grandmother     Prior to Admission medications   Medication Sig Start Date End Date Taking? Authorizing Provider  acetaminophen (TYLENOL) 500 MG tablet Take 1,000 mg by mouth every 6 (six) hours as needed for moderate pain.   Yes [provider]  amLODipine (NORVASC) 5 MG tablet Take 1 tablet (5 mg total) by mouth daily. Take 1 tablet on Monday,Wednesday and Friday then take 2 tablets on Tuesday,Thursday,Saturday and Sunday. 03/18/23  Yes Sherryll Burger, Pratik D, DO  carvedilol (COREG) 12.5 MG tablet Take 12.5 mg by mouth 2 (two) times daily with a meal. 12/24/22 04/13/23 Yes [provider]  cloNIDine (CATAPRES) 0.1 MG tablet Take 1 tablet (0.1 mg total) by mouth 2 (two) times daily. 11/12/22  Yes Arran Fessel, Courage, MD  cyclobenzaprine (FLEXERIL) 5 MG tablet Take 5 mg by mouth 3 (three) times daily as needed for muscle spasms. 04/11/23  Yes [provider]  irbesartan (AVAPRO) 150 MG tablet Take 150 mg by mouth daily at 6 PM.   Yes [provider]  levETIRAcetam (KEPPRA) 500 MG tablet Take 1,000 mg by mouth every evening. Take an additional tablet on Monday,Wednesday and Friday evening after Dialysis 08/09/21  Yes [provider]  lidocaine-prilocaine (EMLA) cream Apply 1 Application topically 3 (three) times a week. 06/04/22  Yes [provider]  naloxone (NARCAN) nasal spray 4 mg/0.1 mL Place 0.4 mg into the nose once. 10/03/22  Yes [provider]  pantoprazole (PROTONIX) 40 MG tablet Take 40 mg by mouth daily. 08/03/22  Yes [provider]  senna-docusate (SENOKOT-S) 8.6-50 MG  tablet Take 2 tablets by mouth at bedtime. 11/12/22  Yes Hager Compston, Courage, MD  sevelamer carbonate (RENVELA) 800 MG tablet Take 800 mg by mouth 3 (three) times daily. 12/11/22  Yes [provider]  torsemide (DEMADEX) 100 MG tablet Take 1 tablet (100 mg total) by mouth 2 (two) times daily. 03/06/23 04/13/23 Yes ShahmehdiGemma Payor, MD    Physical Exam: Vitals:   04/13/23 1653 04/13/23 1654  BP:  (!) 165/79  Pulse:  98  Resp:  16  Temp:  98.7 F (37.1 C)  TempSrc:  Oral  SpO2:  92%  Weight: 52 kg   Height: 5\' 2"  (1.575 m)     Constitutional: NAD, calm, comfortable Vitals:   04/13/23 1653 04/13/23 1654  BP:  (!) 165/79  Pulse:  98  Resp:  16  Temp:  98.7 F (37.1 C)  TempSrc:  Oral  SpO2:  92%  Weight: 52 kg   Height: 5\' 2"  (1.575 m)    Eyes: PERRL, lids and conjunctivae normal ENMT: Mucous membranes are moist. Posterior pharynx clear of any exudate or lesions.Normal dentition.  Neck: normal, supple, no masses, no thyromegaly Respiratory: Tachypneic, clear to auscultation bilaterally, no wheezing, no crackles.  No accessory muscle use.  Cardiovascular: Regular rate and rhythm, no murmurs / rubs / gallops. No extremity edema. 2 Abdomen: no tenderness, no masses palpated. No hepatosplenomegaly.  Urinary catheter present. Musculoskeletal: no clubbing / cyanosis.  Contractures to bilateral lower extremities Skin: Stage IV ulcers to bilateral buttocks.  Clean, no sign of infection.  No drainage present at this time.  No surrounding erythema or induration. Neurologic: Paraplegic.  But good upper body strength. Psychiatric: Normal judgment and insight. Alert and oriented x 3. Normal mood.   Right buttock- pardon poor quality of Photos      Left buttock   Labs on Admission: I have personally reviewed following labs and imaging studies  CBC: Recent Labs  Lab 04/13/23 1717  WBC 11.8*  NEUTROABS 10.7*  HGB 10.5*  HCT 35.9*  MCV 96.0  PLT 176   Basic Metabolic  Panel: Recent Labs  Lab 04/13/23 1717  NA 133*  K 5.8*  CL 95*  CO2 26  GLUCOSE 112*  BUN 66*  CREATININE 5.82*  CALCIUM 8.6*  MG 2.1   GFR: Estimated Creatinine Clearance: 8.6 mL/min (A) (by C-G formula based on SCr of 5.82 mg/dL (H)). Liver Function Tests: Recent Labs  Lab 04/13/23 1717  AST 21  ALT 17  ALKPHOS 97  BILITOT 0.8  PROT 7.7  ALBUMIN 2.6*   Urine analysis:    Component Value Date/Time   COLORURINE YELLOW 12/13/2022 2115   APPEARANCEUR CLOUDY (A) 12/13/2022 2115   LABSPEC 1.009 12/13/2022 2115   PHURINE 8.0 12/13/2022 2115   GLUCOSEU 150 (A) 12/13/2022 2115   HGBUR SMALL (A) 12/13/2022 2115   BILIRUBINUR NEGATIVE 12/13/2022 2115   KETONESUR NEGATIVE 12/13/2022 2115   PROTEINUR >=300 (A) 12/13/2022 2115   UROBILINOGEN 0.2 03/31/2015 1200   NITRITE NEGATIVE 12/13/2022 2115   LEUKOCYTESUR TRACE (A) 12/13/2022 2115    Radiological Exams on Admission: DG Chest Port 1 View  Result Date: 04/13/2023 CLINICAL DATA:  Shortness of breath. EXAM: PORTABLE CHEST 1 VIEW COMPARISON:  March 31, 2023 FINDINGS: Enlarged cardiac silhouette.  Mediastinal contours appear intact. Interstitial pulmonary edema. Peribronchial airspace consolidation in the lower lobes. Small pleural effusions cannot be excluded. Stable spinal fusion.  Soft tissues are grossly normal. IMPRESSION: 1. Peribronchial airspace consolidation the lower lobes. This may represent infectious consolidation or atelectasis. 2. Interstitial pulmonary edema. Small pleural effusions cannot be excluded. 3. Enlarged cardiac silhouette. Electronically Signed   By: Ted Mcalpine M.D.   On: 04/13/2023 17:25    EKG: Independently reviewed.  Sinus rhythm, rate 95, QTc 474.  No significant change from prior.  Assessment/Plan Principal Problem:   PNA (pneumonia) Active Problems:   Sepsis (HCC)   Pulmonary edema   Diarrhea   Decubitus ulcer of buttock   ESRD (end stage renal disease) (HCC)   Suprapubic  catheter (HCC)   Paraplegia (HCC)   Chronic anemia   Osteomyelitis of multiple sites-chronic   Assessment and Plan: * PNA (pneumonia) Presenting with dyspnea, febrile to 100.9.  Chest x-ray shows peribronchial airspace consolidation in the lower lobes, may represent infectious consolidation or atelectasis.  Meet sepsis criteria-with tachycardia-heart rate 90s, febrile to 100.9, leukocytosis of 11.8, tachypnea respirate rate 16-26. -IV Levaquin started in the ED, continue, allergy to doxycycline and azithromycin limiting options for atypical coverage. -Obtain UA -Check COVID  Diarrhea Reports chronic profuse diarrhea over the past 2 days. Febrile- 100.9.  WBC 11.8.  No vomiting.  Paraplegic is unaware of abdominal pain if present.   -Follow-up stool C. difficile, GI pathogen panel -Defer abdominal imaging for now  Pulmonary edema Chest x-ray showing interstitial pulmonary edema, small pleural effusions not excluded.  O2 sat 92 to 98% on room air.  Reports she has not missed any HD sessions.  Last HD was 11/15 2 days ago. -Per HD in a.m.  Sepsis (HCC) Meet sepsis criteria with tachycardia tachypnea, mild leukocytosis of 11.2.  Has bilateral sacral decubitus ulcers, both of them appear clean, with no evidence of surrounding cellulitis or wound infection. -Check lactic acid -Obtain blood cultures -Check UA -Continue Levaquin per pharmacy  ESRD (end stage renal disease) (HCC) On HD Monday Wednesday Friday.  Chest x-ray today showing interstitial edema.  Potassium 5.8. - EDP talked to nephrologist Dr. Arlean Hopping, plan for HD in a.m., hold off on fluids -Lokelma 5 mg given  Decubitus ulcer of buttock Bilateral sacral decubitus ulcer, do not appear infected. -Wound care consult  Chronic anemia Hemoglobin stable at 11.8  Paraplegia (HCC) Sacral decubitus ulcers, and chronic indwelling catheter.   DVT prophylaxis: Heparin Code Status: FULL code-confirmed with patient at  bedside Family Communication: None at bedside Disposition Plan: ~2 days Consults called: Nephrology Admission status: Inpt Tele  I certify that at the point of admission it is my clinical judgment that the patient will require inpatient hospital care spanning beyond 2 midnights from the point of admission due to high intensity of service, high risk for further deterioration and high frequency of surveillance required.  Author: Onnie Boer, MD 04/13/2023 9:15 PM  For on call review [email or web address].

## 2023-04-13 NOTE — Assessment & Plan Note (Signed)
Sacral decubitus ulcers, and chronic indwelling catheter.

## 2023-04-13 NOTE — Assessment & Plan Note (Signed)
On HD Monday Wednesday Friday.  Chest x-ray today showing interstitial edema.  Potassium 5.8. - EDP talked to nephrologist Dr. Arlean Hopping, plan for HD in a.m., hold off on fluids -Lokelma 5 mg given

## 2023-04-13 NOTE — Progress Notes (Signed)
Called by ED about this patient who is here w/ diarrhea off and on. Had her HD on Friday (MWF [facility] [place]). +SOB. Labs showed K+ 5.8, creat 5.8.  Per ED MD in ED pt was tachypneic and exam suggested vol overload. No fever. CXR shows R > L perihilar congestion and some early IS edema. BP 165/ 75, HR 90, RR 16- 30.  Temp 100.9 in ED. In ED got lokelma 10 gm and IV levaquin 750mg  x 1.  Pt was admitted. Renal plan will be for HD tomorrow and formal consultation tomorrow.    MWF [facility] [place]  - needs updating (from 2 wks ago) -->  3h  400/500   3K/2.5Ca bath   LUA AVF  heparin none    Rob Elver Stadler  MD  [facility] 04/13/2023, 9:17 PM  Recent Labs  Lab 04/13/23 1717  HGB 10.5*  ALBUMIN 2.6*  CALCIUM 8.6*  CREATININE 5.82*  K 5.8*    Inpatient medications:  heparin  5,000 Units Subcutaneous Q8H    acetaminophen **OR** acetaminophen, ondansetron **OR** ondansetron (ZOFRAN) IV, polyethylene glycol

## 2023-04-13 NOTE — Assessment & Plan Note (Signed)
Chest x-ray showing interstitial pulmonary edema, small pleural effusions not excluded.  O2 sat 92 to 98% on room air.  Reports she has not missed any HD sessions.  Last HD was 11/15 2 days ago. -Per HD in a.m.

## 2023-04-13 NOTE — ED Provider Notes (Signed)
[name] Provider Note   CSN: [id number] Arrival date & time: 04/13/23  1647     History  Chief Complaint  Patient presents with   Diarrhea    [name] is a 57 y.o. female.   Diarrhea  This patient is a 57 year old female, she has a known history of end-stage renal disease on dialysis for approximately 4 years, her dialysis access is in the left upper extremity.  She is admitted to the hospital relatively frequently, she was admitted approximately 2 weeks ago with pulmonary edema, 2 weeks prior to that for pain in her chest, 2 weeks prior to that for acute hypoxic respiratory failure, prior to that she had been admitted for diabetic ulcers in July 2024.  She has a known history of a spinal cord injury 25 years ago and is not ambulatory infection has no pain below the bellybutton.  The patient reports that she has had diarrhea for the last 48 to 72 hours, it has been quite severe, watery, voluminous, she has been back and forth to the bathroom multiple times and having to change her diaper becoming more weak and more short of breath.  She did not miss dialysis, she did have a full session on Friday, it was immediately after that session that she started having the watery diarrhea.  She denies any blood, she denies vomiting, she denies coughing but does state that she feels short of breath    Home Medications Prior to Admission medications   Medication Sig Start Date End Date Taking? Authorizing Provider  acetaminophen (TYLENOL) 500 MG tablet Take 1,000 mg by mouth every 6 (six) hours as needed for moderate pain.    [provider]  albuterol (VENTOLIN HFA) 108 (90 Base) MCG/ACT inhaler Inhale 2 puffs into the lungs every 4 (four) hours as needed for wheezing or shortness of breath. 11/12/22   Shon Hale, MD  amLODipine (NORVASC) 5 MG tablet Take 1 tablet (5 mg total) by mouth daily. Take 1 tablet on Monday,Wednesday  and Friday then take 2 tablets on Tuesday,Thursday,Saturday and Sunday. 03/18/23   Sherryll Burger, Pratik D, DO  carvedilol (COREG) 12.5 MG tablet Take 12.5 mg by mouth 2 (two) times daily with a meal. 12/24/22 03/31/23  [provider]  cloNIDine (CATAPRES) 0.1 MG tablet Take 1 tablet (0.1 mg total) by mouth 2 (two) times daily. 11/12/22   Shon Hale, MD  cyclobenzaprine (FLEXERIL) 10 MG tablet Take 10 mg by mouth 3 (three) times daily. 01/29/23   [provider]  irbesartan (AVAPRO) 150 MG tablet Take 150 mg by mouth daily at 6 PM.    [provider]  levETIRAcetam (KEPPRA) 500 MG tablet Take 1,000 mg by mouth every evening. Take an additional tablet on Monday,Wednesday and Friday evening after Dialysis 08/09/21   [provider]  lidocaine-prilocaine (EMLA) cream Apply 1 Application topically 3 (three) times a week. 06/04/22   [provider]  naloxone [facility]) nasal spray 4 mg/0.1 mL Place 0.4 mg into the nose once. 10/03/22   [provider]  pantoprazole (PROTONIX) 40 MG tablet Take 40 mg by mouth daily. 08/03/22   [provider]  senna-docusate (SENOKOT-S) 8.6-50 MG tablet Take 2 tablets by mouth at bedtime. 11/12/22   Shon Hale, MD  sevelamer carbonate (RENVELA) 800 MG tablet Take 800 mg by mouth 3 (three) times daily. 12/11/22   [provider]  torsemide (DEMADEX) 100 MG tablet Take 1 tablet (100 mg total)  by mouth 2 (two) times daily. 03/06/23 04/05/23  Kendell Bane, MD      Allergies    Benadryl [diphenhydramine hcl (sleep)], Daptomycin, Linezolid, Moxifloxacin, Quinine derivatives, Vancomycin, Azithromycin, Tetracycline, and Zosyn [piperacillin sod-tazobactam so]    Review of Systems   Review of Systems  Gastrointestinal:  Positive for diarrhea.  All other systems reviewed and are negative.   Physical Exam Updated Vital Signs BP (!) 165/79   Pulse 98   Temp 98.7 F (37.1 C) (Oral)   Resp 16   Ht 1.575 m (5'  2")   Wt 52 kg   LMP  (LMP Unknown)   SpO2 92%   BMI 20.97 kg/m  Physical Exam Vitals and nursing note reviewed.  Constitutional:      General: She is not in acute distress.    Appearance: She is well-developed.  HENT:     Head: Normocephalic and atraumatic.     Mouth/Throat:     Pharynx: No oropharyngeal exudate.  Eyes:     General: No scleral icterus.       Right eye: No discharge.        Left eye: No discharge.     Conjunctiva/sclera: Conjunctivae normal.     Pupils: Pupils are equal, round, and reactive to light.  Neck:     Thyroid: No thyromegaly.     Vascular: No JVD.     Comments: JVD present Cardiovascular:     Rate and Rhythm: Normal rate and regular rhythm.     Heart sounds: Normal heart sounds. No murmur heard.    No friction rub. No gallop.     Comments: Fistula left upper extremity without redness warmth tenderness or abnormal appearance, good thrill Pulmonary:     Effort: Respiratory distress present.     Breath sounds: Wheezing and rales present.  Abdominal:     General: Bowel sounds are normal. There is no distension.     Palpations: Abdomen is soft. There is no mass.     Tenderness: There is no abdominal tenderness.     Comments: Suprapubic catheter in place  Musculoskeletal:        General: No tenderness. Normal range of motion.     Cervical back: Normal range of motion and neck supple.  Lymphadenopathy:     Cervical: No cervical adenopathy.  Skin:    General: Skin is warm and dry.     Findings: No erythema or rash.  Neurological:     Mental Status: She is alert.     Coordination: Coordination normal.  Psychiatric:        Behavior: Behavior normal.     ED Results / Procedures / Treatments   Labs (all labs ordered are listed, but only abnormal results are displayed) Labs Reviewed  CBC WITH DIFFERENTIAL/PLATELET - Abnormal; Notable for the following components:      Result Value   WBC 11.8 (*)    RBC 3.74 (*)    Hemoglobin 10.5 (*)    HCT  35.9 (*)    MCHC 29.2 (*)    RDW 19.3 (*)    Neutro Abs 10.7 (*)    Abs Immature Granulocytes 0.11 (*)    All other components within normal limits  COMPREHENSIVE METABOLIC PANEL - Abnormal; Notable for the following components:   Sodium 133 (*)    Potassium 5.8 (*)    Chloride 95 (*)    Glucose, Bld 112 (*)    BUN 66 (*)    Creatinine, Ser 5.82 (*)  Calcium 8.6 (*)    Albumin 2.6 (*)    GFR, Estimated 8 (*)    All other components within normal limits  C DIFFICILE QUICK SCREEN W PCR REFLEX    MAGNESIUM    EKG EKG Interpretation Date/Time:  Sunday April 13 2023 17:00:22 EST Ventricular Rate:  95 PR Interval:  129 QRS Duration:  104 QT Interval:  377 QTC Calculation: 474 R Axis:   -64  Text Interpretation: Sinus rhythm Probable left atrial enlargement Incomplete RBBB and LAFB Anteroseptal infarct, age indeterminate Confirmed by Eber Hong ([id number]) on 04/13/2023 6:28:08 PM  Radiology DG Chest Port 1 View  Result Date: 04/13/2023 CLINICAL DATA:  Shortness of breath. EXAM: PORTABLE CHEST 1 VIEW COMPARISON:  March 31, 2023 FINDINGS: Enlarged cardiac silhouette.  Mediastinal contours appear intact. Interstitial pulmonary edema. Peribronchial airspace consolidation in the lower lobes. Small pleural effusions cannot be excluded. Stable spinal fusion.  Soft tissues are grossly normal. IMPRESSION: 1. Peribronchial airspace consolidation the lower lobes. This may represent infectious consolidation or atelectasis. 2. Interstitial pulmonary edema. Small pleural effusions cannot be excluded. 3. Enlarged cardiac silhouette. Electronically Signed   By: Ted Mcalpine M.D.   On: 04/13/2023 17:25    Procedures .Critical Care  Performed by: Eber Hong, MD Authorized by: Eber Hong, MD   Critical care provider statement:    Critical care time (minutes):  45   Critical care time was exclusive of:  Separately billable procedures and treating other patients and teaching  time   Critical care was necessary to treat or prevent imminent or life-threatening deterioration of the following conditions:  Respiratory failure and renal failure   Critical care was time spent personally by me on the following activities:  Development of treatment plan with patient or surrogate, discussions with consultants, evaluation of patient's response to treatment, examination of patient, obtaining history from patient or surrogate, review of old charts, re-evaluation of patient's condition, pulse oximetry, ordering and review of radiographic studies, ordering and review of laboratory studies and ordering and performing treatments and interventions   I assumed direction of critical care for this patient from another provider in my specialty: no     Care discussed with: admitting provider   Comments:           Medications Ordered in ED Medications  levofloxacin (LEVAQUIN) IVPB 750 mg (has no administration in time range)  sodium zirconium cyclosilicate (LOKELMA) packet 5 g (has no administration in time range)  loperamide (IMODIUM) capsule 4 mg (4 mg Oral Given 04/13/23 1712)    ED Course/ Medical Decision Making/ A&P                                 Medical Decision Making Amount and/or Complexity of Data Reviewed Labs: ordered. Radiology: ordered. ECG/medicine tests: ordered.  Risk Prescription drug management. Decision regarding hospitalization.   The patient is tachypneic, she has decreased bowel sounds, she has abnormal lung sounds consistent with fluid overload and there is significant concern that this may be related to an infectious etiology, she is warm to the touch and will need a rectal temperature to make sure there is no fever, the patient is also tachypneic and there is concern for pulmonary edema.   This patient presents to the ED for concern of diarrhea, this involves an extensive number of treatment options, and is a complaint that carries with it a high  risk of complications and morbidity.  The differential diagnosis includes see above   Co morbidities that complicate the patient evaluation  Stage renal disease   Additional history obtained:  Additional history obtained from emergency medical record External records from outside source obtained and reviewed including multiple prior visits to the hospital and admissions to the hospital as outlined in the history above   Lab Tests:  I Ordered, and personally interpreted labs.  The pertinent results include: Hyperkalemia, known renal failure, mild leukocytosis   Imaging Studies ordered:  I ordered imaging studies including chest x-ray I independently visualized and interpreted imaging which showed pulmonary edema or asymmetrical infiltrates I agree with the radiologist interpretation   Cardiac Monitoring: / EKG:  The patient was maintained on a cardiac monitor.  I personally viewed and interpreted the cardiac monitored which showed an underlying rhythm of: Sinus rhythm, borderline tachycardia   Consultations Obtained:  I requested consultation with the nephrology, Dr. Chales Abrahams will arrange for dialysis in the morning, recommends no IV fluids tonight,   hospitalist also consulted,and discussed lab and imaging findings as well as pertinent plan - they recommend: Admission to hospital   Problem List / ED Course / Critical interventions / Medication management  Patient given Levaquin, minimized IV fluids, Imodium given I have reviewed the patients home medicines and have made adjustments as needed The patient is mentating, she is not hypoxic, she is 92 to 93%, will get dialyzed in the morning, no findings of hyperkalemia on EKG, Lokelma given   Social Determinants of Health:  Dialysis, critically ill   Test / Admission - Considered:  Admit to higher level of care         Final Clinical Impression(s) / ED Diagnoses Final diagnoses:  Acute pulmonary edema (HCC)   Hyperkalemia  Diarrhea of presumed infectious origin     Eber Hong, MD 04/13/23 1901

## 2023-04-13 NOTE — Assessment & Plan Note (Signed)
Bilateral sacral decubitus ulcer, do not appear infected. -Wound care consult

## 2023-04-13 NOTE — ED Triage Notes (Signed)
Pt has had diarrhea since Dialysis Friday.

## 2023-04-13 NOTE — Assessment & Plan Note (Signed)
Reports chronic profuse diarrhea over the past 2 days. Febrile- 100.9.  WBC 11.8.  No vomiting.  Paraplegic is unaware of abdominal pain if present.   -Follow-up stool C. difficile, GI pathogen panel -Defer abdominal imaging for now

## 2023-04-13 NOTE — Assessment & Plan Note (Addendum)
Presenting with dyspnea, febrile to 100.9.  Chest x-ray shows peribronchial airspace consolidation in the lower lobes, may represent infectious consolidation or atelectasis.  Meet sepsis criteria-with tachycardia-heart rate 90s, febrile to 100.9, leukocytosis of 11.8, tachypnea respirate rate 16-26. -IV Levaquin started in the ED, continue, allergy to doxycycline and azithromycin limiting options for atypical coverage. -Obtain UA -Check COVID

## 2023-04-13 NOTE — Assessment & Plan Note (Signed)
Hemoglobin stable at 11.8

## 2023-04-14 DIAGNOSIS — J189 Pneumonia, unspecified organism: Secondary | ICD-10-CM | POA: Diagnosis not present

## 2023-04-14 DIAGNOSIS — N186 End stage renal disease: Secondary | ICD-10-CM | POA: Diagnosis not present

## 2023-04-14 DIAGNOSIS — D649 Anemia, unspecified: Secondary | ICD-10-CM

## 2023-04-14 LAB — BASIC METABOLIC PANEL
Anion gap: 13 (ref 5–15)
BUN: 71 mg/dL — ABNORMAL HIGH (ref 6–20)
CO2: 22 mmol/L (ref 22–32)
Calcium: 8.4 mg/dL — ABNORMAL LOW (ref 8.9–10.3)
Chloride: 93 mmol/L — ABNORMAL LOW (ref 98–111)
Creatinine, Ser: 5.81 mg/dL — ABNORMAL HIGH (ref 0.44–1.00)
GFR, Estimated: 8 mL/min — ABNORMAL LOW (ref >60)
Glucose, Bld: 101 mg/dL — ABNORMAL HIGH (ref 70–99)
Potassium: 4.7 mmol/L (ref 3.5–5.1)
Sodium: 128 mmol/L — ABNORMAL LOW (ref 135–145)

## 2023-04-14 LAB — URINALYSIS, ROUTINE W REFLEX MICROSCOPIC
Bilirubin Urine: NEGATIVE
Glucose, UA: 50 mg/dL — AB
Ketones, ur: NEGATIVE mg/dL
Nitrite: NEGATIVE
Protein, ur: >=300 mg/dL — AB
Specific Gravity, Urine: 1.012 (ref 1.005–1.030)
WBC, UA: >50 WBC/hpf (ref 0–5)
pH: 8 (ref 5.0–8.0)

## 2023-04-14 LAB — CBC
HCT: 30.4 % — ABNORMAL LOW (ref 36.0–46.0)
Hemoglobin: 9 g/dL — ABNORMAL LOW (ref 12.0–15.0)
MCH: 27.7 pg (ref 26.0–34.0)
MCHC: 29.6 g/dL — ABNORMAL LOW (ref 30.0–36.0)
MCV: 93.5 fL (ref 80.0–100.0)
Platelets: 165 10*3/uL (ref 150–400)
RBC: 3.25 MIL/uL — ABNORMAL LOW (ref 3.87–5.11)
RDW: 19 % — ABNORMAL HIGH (ref 11.5–15.5)
WBC: 11.5 10*3/uL — ABNORMAL HIGH (ref 4.0–10.5)
nRBC: 0 % (ref 0.0–0.2)

## 2023-04-14 LAB — SARS CORONAVIRUS 2 BY RT PCR: SARS Coronavirus 2 by RT PCR: NEGATIVE

## 2023-04-14 MED ORDER — SODIUM CHLORIDE 0.9 % IV SOLN
2.0000 g | INTRAVENOUS | Status: DC
  Administered 2023-04-15: 2 g via INTRAVENOUS
  Filled 2023-04-14: qty 20

## 2023-04-14 MED ORDER — CARVEDILOL 12.5 MG PO TABS
12.5000 mg | ORAL_TABLET | Freq: Two times a day (BID) | ORAL | Status: DC
  Administered 2023-04-15: 12.5 mg via ORAL
  Filled 2023-04-14: qty 1

## 2023-04-14 MED ORDER — LIDOCAINE HCL (PF) 1 % IJ SOLN: 5.0000 mL | INTRAMUSCULAR | Status: DC | PRN

## 2023-04-14 MED ORDER — CYCLOBENZAPRINE HCL 10 MG PO TABS
5.0000 mg | ORAL_TABLET | Freq: Once | ORAL | Status: AC
  Administered 2023-04-14: 5 mg via ORAL
  Filled 2023-04-14: qty 1

## 2023-04-14 MED ORDER — DARBEPOETIN ALFA 150 MCG/0.3ML IJ SOSY
150.0000 ug | PREFILLED_SYRINGE | Freq: Once | INTRAMUSCULAR | Status: AC
  Administered 2023-04-14: 150 ug via SUBCUTANEOUS
  Filled 2023-04-14: qty 0.3

## 2023-04-14 MED ORDER — TRAMADOL HCL 50 MG PO TABS
50.0000 mg | ORAL_TABLET | Freq: Once | ORAL | Status: AC
  Administered 2023-04-14: 50 mg via ORAL
  Filled 2023-04-14: qty 1

## 2023-04-14 MED ORDER — DOXYCYCLINE HYCLATE 100 MG IV SOLR
100.0000 mg | Freq: Two times a day (BID) | INTRAVENOUS | Status: DC
  Administered 2023-04-15: 100 mg via INTRAVENOUS
  Filled 2023-04-14 (×3): qty 100

## 2023-04-14 MED ORDER — LEVETIRACETAM 500 MG PO TABS
1000.0000 mg | ORAL_TABLET | Freq: Every day | ORAL | Status: DC
  Administered 2023-04-14: 1000 mg via ORAL
  Filled 2023-04-14: qty 2

## 2023-04-14 MED ORDER — NEPRO/CARBSTEADY PO LIQD: 237.0000 mL | ORAL | Status: DC | PRN

## 2023-04-14 MED ORDER — LEVOFLOXACIN IN D5W 500 MG/100ML IV SOLN
500.0000 mg | INTRAVENOUS | Status: DC
Start: 2023-04-15 — End: 2023-04-14

## 2023-04-14 MED ORDER — LIDOCAINE-PRILOCAINE 2.5-2.5 % EX CREA: 1.0000 | TOPICAL_CREAM | CUTANEOUS | Status: DC | PRN

## 2023-04-14 MED ORDER — AMLODIPINE BESYLATE 5 MG PO TABS
5.0000 mg | ORAL_TABLET | Freq: Every day | ORAL | Status: DC
  Administered 2023-04-15: 5 mg via ORAL
  Filled 2023-04-14: qty 1

## 2023-04-14 MED ORDER — PANTOPRAZOLE SODIUM 40 MG PO TBEC
40.0000 mg | DELAYED_RELEASE_TABLET | Freq: Every day | ORAL | Status: DC
  Administered 2023-04-14 – 2023-04-15 (×2): 40 mg via ORAL
  Filled 2023-04-14 (×2): qty 1

## 2023-04-14 MED ORDER — PENTAFLUOROPROP-TETRAFLUOROETH EX AERO: 1.0000 | INHALATION_SPRAY | CUTANEOUS | Status: DC | PRN

## 2023-04-14 MED ORDER — IPRATROPIUM-ALBUTEROL 0.5-2.5 (3) MG/3ML IN SOLN
3.0000 mL | Freq: Four times a day (QID) | RESPIRATORY_TRACT | Status: DC
  Administered 2023-04-15: 3 mL via RESPIRATORY_TRACT
  Filled 2023-04-14 (×2): qty 3

## 2023-04-14 MED ORDER — CLONIDINE HCL 0.1 MG PO TABS
0.1000 mg | ORAL_TABLET | Freq: Two times a day (BID) | ORAL | Status: DC
  Administered 2023-04-14 – 2023-04-15 (×2): 0.1 mg via ORAL
  Filled 2023-04-14 (×2): qty 1

## 2023-04-14 MED ORDER — DM-GUAIFENESIN ER 30-600 MG PO TB12
1.0000 | ORAL_TABLET | Freq: Two times a day (BID) | ORAL | Status: DC
  Administered 2023-04-15: 1 via ORAL
  Filled 2023-04-14 (×2): qty 1

## 2023-04-14 MED ORDER — ALBUTEROL SULFATE (2.5 MG/3ML) 0.083% IN NEBU: 2.5000 mg | INHALATION_SOLUTION | RESPIRATORY_TRACT | Status: DC | PRN

## 2023-04-14 NOTE — TOC Initial Note (Signed)
Transition of Care [facility]) - Initial/Assessment Note    Patient Details  Name: [name] MRN: [id number] Date of Birth: 1966-03-08  [facility] of [facility]) CM/SW Contact:    Leitha Bleak, RN Phone Number: 04/14/2023, 11:54 AM  Clinical Narrative:           Patient admitted with pneumonia. Patient has a high readmission score. Patient well know to [facility], recently assessed, Dialysis patient uses [facility] for transportation. Live at home with her daughter. No needs identified. HD today, planning to discharge home 1-2 days.        Expected Discharge Plan: Home w Home Health Services Barriers to Discharge: Continued Medical Work up   Patient Goals and CMS Choice Patient states their goals for this hospitalization and ongoing recovery are:: return home CMS Medicare.gov Compare Post Acute Care list provided to:: Patient Choice offered to / list presented to : Patient      Expected Discharge Plan and Services     Post Acute Care Choice: Durable Medical Equipment, Home Health Living arrangements for the past 2 months: Single Family Home                      Prior Living Arrangements/Services Living arrangements for the past 2 months: Single Family Home Lives with:: Adult Children             Current home services: DME    Activities of Daily Living   ADL Screening (condition at time of admission) Independently performs ADLs?: No Does the patient have a NEW difficulty with bathing/dressing/toileting/self-feeding that is expected to last >3 days?: Yes (Initiates electronic notice to provider for possible OT consult) Does the patient have a NEW difficulty with getting in/out of bed, walking, or climbing stairs that is expected to last >3 days?: Yes (Initiates electronic notice to provider for possible PT consult) Does the patient have a NEW difficulty with communication that is expected to last >3 days?: Yes (Initiates electronic notice to provider for possible SLP  consult) Is the patient deaf or have difficulty hearing?: No Does the patient have difficulty seeing, even when wearing glasses/contacts?: No Does the patient have difficulty concentrating, remembering, or making decisions?: No    Orientation: : Oriented to Self, Oriented to Place, Oriented to  Time, Oriented to Situation Alcohol / Substance Use: Not Applicable Psych Involvement: No (comment)  Admission diagnosis:  Diarrhea of presumed infectious origin [R19.7] Hyperkalemia [E87.5] Acute pulmonary edema (HCC) [J81.0] Pulmonary edema [J81.1] PNA (pneumonia) [J18.9] Patient Active Problem List   Diagnosis Date Noted   Pulmonary edema 04/13/2023   Diarrhea 04/13/2023   Respiratory failure with hypoxia (HCC) 04/01/2023   Dyspnea and respiratory abnormalities 03/17/2023   Dyspnea 03/05/2023   Retroperitoneal hematoma 12/31/2022   Subcutaneous hematoma 12/31/2022   Hip osteomyelitis, right (HCC) 12/18/2022   Sepsis due to undetermined organism (HCC) 12/18/2022   Chronic pain 12/14/2022   Bacteremia 11/08/2022   Loss of weight 10/26/2022   N&V (nausea and vomiting) 10/26/2022   Cellulitis of knee, left 07/24/2022   Infected decubitus ulcer 07/23/2022   Osteomyelitis of multiple sites-chronic     Chronic anemia 02/25/2022   ESRD (end stage renal disease) (HCC) 02/25/2022   Abscess of buttock, right 08/25/2021   GERD (gastroesophageal reflux disease) 08/25/2021   Anxiety 08/25/2021   Decubitus ulcers 08/25/2021   Anemia 08/24/2021   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 chronic kidney disease (HCC) 04/25/2017   AKI (acute kidney injury) (HCC) 04/25/2017   Dehydration 04/25/2017  Metabolic acidosis 04/25/2017   Altered mental status 06/22/2016   Altered mental state 08/16/2015   Pressure ulcer 05/16/2015   Acute encephalopathy 05/15/2015   Malnutrition (HCC)    Encephalopathy acute 10/10/2014   Sepsis (HCC) 10/10/2014   PNA (pneumonia) 10/10/2014   Encephalomalacia 10/10/2014   TBI  (traumatic brain injury) (HCC) 10/10/2014   Severe protein-calorie malnutrition (HCC) 10/10/2014   UTI (urinary tract infection) 08/26/2014   Hypokalemia 08/26/2014   Abnormal uterine bleeding (AUB) 06/15/2014   Essential hypertension    Seizure disorder (HCC)    Status epilepticus (HCC) 05/02/2014   Hypertensive emergency without congestive heart failure 05/02/2014   Acute renal failure (HCC) 05/21/2013   Hyperkalemia 05/21/2013   Hypothermia 05/21/2013   Paraplegia (HCC) 06/25/2012   Suprapubic catheter (HCC) 06/25/2012   Decubitus ulcer of buttock 06/25/2012   PCP:  Richarda Osmond, FNP Pharmacy:   [facility] - [place], [place] - [address] [place] [address] Phone: [phone] Fax: [phone]     Social Determinants of Health (SDOH) Social History: SDOH Screenings   Food Insecurity: No Food Insecurity (04/13/2023)  Housing: Patient Declined (04/13/2023)  Transportation Needs: No Transportation Needs (04/13/2023)  Utilities: Not At Risk (04/13/2023)  Tobacco Use: Low Risk  (04/13/2023)   SDOH Interventions:     Readmission Risk Interventions    04/14/2023   11:47 AM 04/01/2023   11:36 AM 12/17/2022   11:39 AM  Readmission Risk Prevention Plan  Transportation Screening Complete Complete Complete  Medication Review (RN Care Manager) Complete Referral to Pharmacy Complete  PCP or Specialist appointment within 3-5 days of discharge Not Complete Complete Not Complete  HRI or Home Care Consult Complete Complete Complete  SW Recovery Care/Counseling Consult Complete Complete Complete  Palliative Care Screening Not Applicable Not Applicable Not Applicable  Skilled Nursing Facility Not Applicable Not Applicable Not Applicable

## 2023-04-14 NOTE — Plan of Care (Signed)
  Problem: Education: Goal: Knowledge of General Education information will improve Description Including pain rating scale, medication(s)/side effects and non-pharmacologic comfort measures Outcome: Progressing   

## 2023-04-14 NOTE — Progress Notes (Signed)
PROGRESS NOTE  [name], is a 57 y.o. female, DOB - [birth year], ZOX:[id number]  Admit date - 04/13/2023   Admitting Physician Ejiroghene Wendall Stade, MD  Outpatient Primary MD for the patient is Christina, [name], FNP  LOS - 1  Chief Complaint  Patient presents with   Diarrhea      Brief Narrative:  57 y.o. female with medical history significant for Paraplegia, suprapubic cath, ESRD, decubitus ulcers admitted on 04/13/23 with Acute Hypoxic Resp Failure due sepsis due to PNA and Volume Overload/Pulm Edema   -Assessment and Plan: 1)Sepsis due to  PNA/CAP--POA Received Levaquin in ED -WBC 11.8 >> 11.5  -Tmax 100.9 T-current 98.1 Discussed with Pharmacist ok to De-escalate to Rocephin and Doxycycline pending blood cultures COVID Neg -Continue bronchodilators and mucolytics  2)Diarrhea -PTA patient apparently had profuse diarrhea.  No vomiting.   -Paraplegic is unaware of abdominal pain if present.   -No diarrhea since admission, actually no BM in the last 24 hours  3)Volume overload/Pulmonary edema--in an ESRD patient MWF Chest x-ray showing interstitial pulmonary edema, small pleural effusions not excluded.  O2 sat 92 to 98% on room air.  Reports she has not missed any HD sessions.  Last HD was 04/11/23 Continue to use hemodialysis to address volume status. -Nephrology consult appreciated  4)Bilateral sacral and buttock decubitus ulcers, both of them appear clean, with no evidence of surrounding cellulitis or wound infection. --- Wound care consult appreciated  5)Hyperkalemia--received Lokelma Use hemodialysis to address electrolyte issues  6)Chronic Anemia of ESRD Hemoglobin stable at 11.8 -Defer decision on Procrit/ESA agent to nephrology team  7)Paraplegia /ambulatory dysfunction-neurogenic bladder - chronic indwelling catheter. -- Supportive care  8)HTN--elevated BP okay to resume Coreg, amlodipine and clonidine  9) seizure disorder--continue Keppra  1000 mg nightly  10)GERD-continue Protonix 40 mg daily  Status is: Inpatient   Disposition: The patient is from: Home              Anticipated d/c is to: Home              Anticipated d/c date is: 2 days              Patient currently is not medically stable to d/c. Barriers: Not Clinically Stable-   Code Status :  -  Code Status: Full Code   Family Communication:   NA (patient is alert, awake and coherent)   DVT Prophylaxis  :   - SCDs  heparin injection 5,000 Units Start: 04/13/23 2200   Lab Results  Component Value Date   PLT 165 04/14/2023    Inpatient Medications  Scheduled Meds:  Chlorhexidine Gluconate Cloth  6 each Topical Q0600   darbepoetin (ARANESP) injection - DIALYSIS  150 mcg Subcutaneous Once   heparin  5,000 Units Subcutaneous Q8H   Continuous Infusions:  [START ON 04/15/2023] cefTRIAXone (ROCEPHIN)  IV     [START ON 04/15/2023] doxycycline (VIBRAMYCIN) IV     PRN Meds:.acetaminophen **OR** acetaminophen, ondansetron **OR** ondansetron (ZOFRAN) IV, polyethylene glycol   Anti-infectives (From admission, onward)    Start     Dose/Rate Route Frequency Ordered Stop   04/15/23 2000  levofloxacin (LEVAQUIN) IVPB 500 mg  Status:  Discontinued        500 mg 100 mL/hr over 60 Minutes Intravenous Every 48 hours 04/14/23 1028 04/14/23 1313   04/15/23 1000  cefTRIAXone (ROCEPHIN) 2 g in sodium chloride 0.9 % 100 mL IVPB        2 g 200 mL/hr over 30  Minutes Intravenous Every 24 hours 04/14/23 1313     04/15/23 1000  doxycycline (VIBRAMYCIN) 100 mg in dextrose 5 % 250 mL IVPB        100 mg 125 mL/hr over 120 Minutes Intravenous Every 12 hours 04/14/23 1313     04/14/23 1900  Levofloxacin (LEVAQUIN) IVPB 250 mg  Status:  Discontinued        250 mg 50 mL/hr over 60 Minutes Intravenous Every 24 hours 04/13/23 2128 04/14/23 1028   04/13/23 1830  levofloxacin (LEVAQUIN) IVPB 750 mg        750 mg 100 mL/hr over 90 Minutes Intravenous  Once 04/13/23 1829 04/14/23  0423       Subjective: [name] today has no further fevers, no emesis,  No chest pain,   - No further diarrhea - Tolerating HD well  Objective: Vitals:   04/13/23 1930 04/13/23 2021 04/13/23 2328 04/14/23 0355  BP: (!) 167/85 (!) 158/67 (!) 160/69 (!) 175/73  Pulse: 99 94 86 91  Resp: (!) 26 (!) 24  (!) 21  Temp:  99.1 F (37.3 C) 98.9 F (37.2 C) 98.1 F (36.7 C)  TempSrc:  Oral Oral Oral  SpO2: 98% 95% 93% 94%  Weight:      Height:        Intake/Output Summary (Last 24 hours) at 04/14/2023 1619 Last data filed at 04/14/2023 1300 Gross per 24 hour  Intake 746.2 ml  Output 150 ml  Net 596.2 ml   Filed Weights   04/13/23 1653  Weight: 52 kg   Physical Exam Gen:- Awake Alert, in no apparent distress  HEENT:- [place].AT, No sclera icterus Neck-Supple Neck,No JVD,.  Lungs-no wheezing, fair symmetrical air movement CV- S1, S2 normal, regular  Abd-  +ve B.Sounds, Abd Soft, No tenderness,   Suprapubic catheter Extremity/Skin:- No  edema, pedal pulses present , bilateral sacral/buttock decubitus ulcers without significant erythema or drainage--- see photos in epic Psych-affect is appropriate, oriented x3 Neuro-generalized weakness, neuromuscular deficits with contractures consistent with paraplegic status MSK- Lt arm AVF with +ve Thrill and bruit  Data Reviewed: I have personally reviewed following labs and imaging studies  CBC: Recent Labs  Lab 04/13/23 1717 04/14/23 0420  WBC 11.8* 11.5*  NEUTROABS 10.7*  --   HGB 10.5* 9.0*  HCT 35.9* 30.4*  MCV 96.0 93.5  PLT 176 165   Basic Metabolic Panel: Recent Labs  Lab 04/13/23 1717 04/14/23 0420  NA 133* 128*  K 5.8* 4.7  CL 95* 93*  CO2 26 22  GLUCOSE 112* 101*  BUN 66* 71*  CREATININE 5.82* 5.81*  CALCIUM 8.6* 8.4*  MG 2.1  --    GFR: Estimated Creatinine Clearance: 8.7 mL/min (A) (by C-G formula based on SCr of 5.81 mg/dL (H)). Liver Function Tests: Recent Labs  Lab 04/13/23 1717  AST 21   ALT 17  ALKPHOS 97  BILITOT 0.8  PROT 7.7  ALBUMIN 2.6*   Recent Results (from the past 240 hour(s))  Culture, blood (Routine X 2) w Reflex to ID Panel     Status: None (Preliminary result)   Collection Time: 04/13/23  9:25 PM   Specimen: BLOOD LEFT HAND  Result Value Ref Range Status   Specimen Description BLOOD LEFT HAND  Final   Special Requests   Final    BOTTLES DRAWN AEROBIC ONLY Blood Culture adequate volume   Culture   Final    NO GROWTH < 12 HOURS Performed at [facility], [address],  [place], [place] [address]    Report Status PENDING  Incomplete  Culture, blood (Routine X 2) w Reflex to ID Panel     Status: None (Preliminary result)   Collection Time: 04/13/23  9:31 PM   Specimen: BLOOD RIGHT FOREARM  Result Value Ref Range Status   Specimen Description BLOOD RIGHT FOREARM  Final   Special Requests   Final    BOTTLES DRAWN AEROBIC AND ANAEROBIC Blood Culture adequate volume   Culture   Final    NO GROWTH < 12 HOURS Performed at [facility], [address], [place], [place] [address]    Report Status PENDING  Incomplete  SARS Coronavirus 2 by RT PCR (hospital order, performed in [facility] hospital lab) *cepheid single result test* Urine, Catheterized     Status: None   Collection Time: 04/14/23  4:30 AM   Specimen: Urine, Catheterized; Nasal Swab  Result Value Ref Range Status   SARS Coronavirus 2 by RT PCR NEGATIVE NEGATIVE Final    Comment: (NOTE) SARS-CoV-2 target nucleic acids are NOT DETECTED.  The SARS-CoV-2 RNA is generally detectable in upper and lower respiratory specimens during the acute phase of infection. The lowest concentration of SARS-CoV-2 viral copies this assay can detect is 250 copies / mL. A negative result does not preclude SARS-CoV-2 infection and should not be used as the sole basis for treatment or other patient management decisions.  A negative result may occur with improper specimen collection / handling, submission of specimen  other than nasopharyngeal swab, presence of viral mutation(s) within the areas targeted by this assay, and inadequate number of viral copies (<250 copies / mL). A negative result must be combined with clinical observations, patient history, and epidemiological information.  Fact Sheet for Patients:   RoadLapTop.co.za  Fact Sheet for Healthcare Providers: http://kim-miller.com/  This test is not yet approved or  cleared by the [place] FDA and has been authorized for detection and/or diagnosis of SARS-CoV-2 by FDA under an Emergency Use Authorization (EUA).  This EUA will remain in effect (meaning this test can be used) for the duration of the COVID-19 declaration under Section 564(b)(1) of the Act, 21 U.S.C. section 360bbb-3(b)(1), unless the authorization is terminated or revoked sooner.  Performed at [facility], [address] [place], [place] [address]    Radiology Studies: [facility] Chest [facility] 1 View  Result Date: 04/13/2023 CLINICAL DATA:  Shortness of breath. EXAM: PORTABLE CHEST 1 VIEW COMPARISON:  March 31, 2023 FINDINGS: Enlarged cardiac silhouette.  Mediastinal contours appear intact. Interstitial pulmonary edema. Peribronchial airspace consolidation in the lower lobes. Small pleural effusions cannot be excluded. Stable spinal fusion.  Soft tissues are grossly normal. IMPRESSION: 1. Peribronchial airspace consolidation the lower lobes. This may represent infectious consolidation or atelectasis. 2. Interstitial pulmonary edema. Small pleural effusions cannot be excluded. 3. Enlarged cardiac silhouette. Electronically Signed   By: Ted Mcalpine M.D.   On: 04/13/2023 17:25    Scheduled Meds:  Chlorhexidine Gluconate Cloth  6 each Topical Q0600   darbepoetin (ARANESP) injection - DIALYSIS  150 mcg Subcutaneous Once   heparin  5,000 Units Subcutaneous Q8H   Continuous Infusions:  [START ON 04/15/2023] cefTRIAXone (ROCEPHIN)   IV     [START ON 04/15/2023] doxycycline (VIBRAMYCIN) IV      LOS: 1 day   Shon Hale M.D on 04/14/2023 at 4:19 PM  Go to [email or web address] - for contact info  [facility] - Office  [phone]  If 7PM-7AM, please contact night-coverage [email or web address] 04/14/2023, 4:19  PM

## 2023-04-14 NOTE — Consult Note (Signed)
ESRD Consult Note  Reason for consult: ESRD, provision of dialysis  Assessment/Recommendations:  ESRD -outpatient HD orders: [facility].  MWF.  3 hours.  EDW 44.5 kg.  LUE AVF.  Flow rates: 400/500.  3K bath.  Heparin: No.  Meds: Calcitriol 0.75 mcg with treatment, Mircera 175 mcg (due today), Venofer 50 mg (due today) -HD today as per MWF schedule  Sepsis PNA -abx per primary service  Diarrhea -w/u per primary  Decubitus ulcer -wound care following  Hyponatremia -will attempt to manage with HD/UF  Hyperkalemia -Improved with med management, HD plan as above  Volume/ hypertension  -UF as tolerated  Anemia of Chronic Kidney Disease Hemoglobin 9. Will dose ESA for today, holding off on iron for now given sepsis.  -Transfuse PRN for Hgb <7  Secondary Hyperparathyroidism/Hyperphosphatemia - resume home binders if on any, will resume calctriol   Recommendations were discussed with the primary team.  Anthony Sar, MD [facility]  History of Present Illness: [name] is a/an 57 y.o. female with a past medical history of ESRD, paraplegia, chronic suprapubic catheter, decubitus ulcers who presents with profuse diarrhea which started after her last dialysis session on 11/15.  No vomiting.  Did report increased difficulty breathing with exertion.  She is found to have a Tmax of 100.9, potassium 5.8.  Chest x-ray did show some interstitial pulmonary edema.  She did receive Lokelma, held off on IV fluids.  Due for dialysis today.  Patient seen and examined bedside this a.m.  She reports feeling well.  She is surprised about the fact that she has fluid in her lungs, has been watching fluid intake closely last couple days.  Denies any chest pain, shortness of breath, issues with her access currently.   Medications:  Current Facility-Administered Medications  Medication Dose Route Frequency Provider Last Rate Last Admin   acetaminophen (TYLENOL) tablet 650  mg  650 mg Oral Q6H PRN Emokpae, Ejiroghene E, MD   650 mg at 04/14/23 0113   Or   acetaminophen (TYLENOL) suppository 650 mg  650 mg Rectal Q6H PRN Emokpae, Ejiroghene E, MD       Chlorhexidine Gluconate Cloth 2 % PADS 6 each  6 each Topical [id number] Delano Metz, MD   6 each at 04/14/23 0507   heparin injection 5,000 Units  5,000 Units Subcutaneous Q8H Emokpae, Ejiroghene E, MD   5,000 Units at 04/14/23 0507   Levofloxacin (LEVAQUIN) IVPB 250 mg  250 mg Intravenous Q24H Rockwell Alexandria, RPH       ondansetron (ZOFRAN) tablet 4 mg  4 mg Oral Q6H PRN Emokpae, Ejiroghene E, MD       Or   ondansetron (ZOFRAN) injection 4 mg  4 mg Intravenous Q6H PRN Emokpae, Ejiroghene E, MD       polyethylene glycol (MIRALAX / GLYCOLAX) packet 17 g  17 g Oral Daily PRN Emokpae, Ejiroghene E, MD         ALLERGIES Benadryl [diphenhydramine hcl (sleep)], Daptomycin, Linezolid, Moxifloxacin, Quinine derivatives, Vancomycin, Azithromycin, Tetracycline, and Zosyn [piperacillin sod-tazobactam so]  MEDICAL HISTORY Past Medical History:  Diagnosis Date   Abnormal uterine bleeding (AUB) 06/15/2014   Cancer (HCC)    uterine   High blood pressure    Paraplegia (lower)    Renal disorder    Seizure disorder (HCC)    Seizures (HCC)    Suprapubic catheter (HCC)    Urinary tract infection      SOCIAL HISTORY Social History   Socioeconomic History   Marital  status: Single    Spouse name: Not on file   Number of children: 3   Years of education: 9 th   Highest education level: Not on file  Occupational History    Comment: Disabled  Tobacco Use   Smoking status: Never   Smokeless tobacco: Never  Vaping Use   Vaping status: Never Used  Substance and Sexual Activity   Alcohol use: Yes    Comment: Occassionally   Drug use: No   Sexual activity: Never  Other Topics Concern   Not on file  Social History Narrative   Patient lives with her son Vicente Serene). Patient is disabled.   Education 9th grade.   Right  handed.   Caffeine - None    Social Determinants of Health   Financial Resource Strain: Not on file  Food Insecurity: No Food Insecurity (04/13/2023)   Hunger Vital Sign    Worried About Running Out of Food in the Last Year: Never true    Ran Out of Food in the Last Year: Never true  Transportation Needs: No Transportation Needs (04/13/2023)   PRAPARE - Administrator, Civil Service (Medical): No    Lack of Transportation (Non-Medical): No  Physical Activity: Not on file  Stress: Not on file  Social Connections: Not on file  Intimate Partner Violence: Not At Risk (04/13/2023)   Humiliation, Afraid, Rape, and Kick questionnaire    Fear of Current or Ex-Partner: No    Emotionally Abused: No    Physically Abused: No    Sexually Abused: No     FAMILY HISTORY Family History  Problem Relation Age of Onset   Cancer Mother    Hypertension Mother    Cancer Sister        breast and then spread everywhere.   Diabetes Paternal Grandmother    Hypertension Paternal Grandmother      Review of Systems: 12 systems were reviewed and negative except per HPI  Physical Exam: Vitals:   04/13/23 2328 04/14/23 0355  BP: (!) 160/69 (!) 175/73  Pulse: 86 91  Resp:  (!) 21  Temp: 98.9 F (37.2 C) 98.1 F (36.7 C)  SpO2: 93% 94%   No intake/output data recorded.  Intake/Output Summary (Last 24 hours) at 04/14/2023 0749 Last data filed at 04/14/2023 0423 Gross per 24 hour  Intake 386.2 ml  Output 150 ml  Net 236.2 ml   General: well-appearing, no acute distress, laying flat in bed HEENT: anicteric sclera, MMM CV: normal rate, no murmurs, no edema Lungs: bilateral chest rise, normal wob Abd: soft, non-tender, non-distended Skin: no visible lesions or rashes Psych: alert, engaged, appropriate mood and affect Neuro: normal speech, no gross focal deficits GU: +Suprapubic cath Dialysis access: LUE AVF +b/t  Test Results Reviewed Lab Results  Component Value Date    NA 128 (L) 04/14/2023   K 4.7 04/14/2023   CL 93 (L) 04/14/2023   CO2 22 04/14/2023   BUN 71 (H) 04/14/2023   CREATININE 5.81 (H) 04/14/2023   CALCIUM 8.4 (L) 04/14/2023   ALBUMIN 2.6 (L) 04/13/2023   PHOS 4.2 04/01/2023    I have reviewed relevant outside healthcare records

## 2023-04-14 NOTE — Progress Notes (Signed)
   HEMODIALYSIS TREATMENT NOTE:  3 hour treatment ordered with 3 L goal but pt asked to end session with 15 minutes remaining due to discomfort and hunger.  Total run time: 2:45, net UF 2.8 liters.  All blood was returned.  Post-HD:  04/14/23 2030  Vitals  Temp 98.7 F (37.1 C)  Temp Source Oral  BP (!) 160/74  MAP (mmHg) 98  BP Location Right Arm  BP Method Automatic  Patient Position (if appropriate) Lying  Pulse Rate 89  Pulse Rate Source Monitor  ECG Heart Rate 81  Resp 20  Oxygen Therapy  SpO2 100 %  O2 Device Room Air  Post Treatment  Dialyzer Clearance Heavily streaked  Hemodialysis Intake (mL) 0 mL  Liters Processed 59.4  Fluid Removed (mL) 2800 mL  Tolerated HD Treatment No (Comment)  Post-Hemodialysis Comments Signed off early / AMA w 85m RTD  AVG/AVF Arterial Site Held (minutes) 5 minutes  AVG/AVF Venous Site Held (minutes) 5 minutes  Fistula / Graft Left Upper arm Arteriovenous fistula  No placement date or time found.   Placed prior to admission: Yes  Orientation: Left  Access Location: Upper arm  Access Type: Arteriovenous fistula  Fistula / Graft Assessment Aneurysm present;Thrill;Bruit  Status Patent   Arman Filter, RN AP [facility]

## 2023-04-14 NOTE — Consult Note (Signed)
WOC Nurse Consult Note: Reason for Consult: pressure injuries Patient history paraplegic, followed outpatient wound care center [facility]. Last seen 03/20/23. Has HHRN and CG that provide wound care 3x wk.   Wound type: Left ischium; Stage 4 Pressure Injury; 2cm x  2cm x 0.4cm  Right ischium; Stage 4 Pressure Injury; 1cm x 1cm x 0.3cm  Pressure Injury POA: Yes Measurement:see above, per nursing flow sheets Wound bed: pale, pink, macerated Drainage (amount, consistency, odor) minimal per nursing flow sheets Periwound: macerated, epibole Dressing procedure/placement/frequency: Continue POC per WCC, alginate and foam dressing 3x wk. Low air mattress ordered for moisture management and pressure redistribution.    Re consult if needed, will not follow at this time. Thanks  Lotoya Casella [employer], RN,CWOCN, CNS, CWON-AP [phone])

## 2023-04-14 NOTE — Plan of Care (Signed)
  Problem: Education: Goal: Knowledge of General Education information will improve Description: Including pain rating scale, medication(s)/side effects and non-pharmacologic comfort measures Outcome: Progressing   Problem: Clinical Measurements: Goal: Ability to maintain clinical measurements within normal limits will improve Outcome: Progressing   Problem: Safety: Goal: Ability to remain free from injury will improve Outcome: Progressing   

## 2023-04-14 NOTE — Progress Notes (Signed)
No acute events overnight. B Kaevion Sinclair RN

## 2023-04-15 ENCOUNTER — Inpatient Hospital Stay (HOSPITAL_COMMUNITY): Payer: 59

## 2023-04-15 DIAGNOSIS — R0600 Dyspnea, unspecified: Secondary | ICD-10-CM | POA: Diagnosis not present

## 2023-04-15 DIAGNOSIS — E875 Hyperkalemia: Secondary | ICD-10-CM

## 2023-04-15 DIAGNOSIS — R0689 Other abnormalities of breathing: Secondary | ICD-10-CM

## 2023-04-15 DIAGNOSIS — R197 Diarrhea, unspecified: Secondary | ICD-10-CM | POA: Diagnosis not present

## 2023-04-15 DIAGNOSIS — J810 Acute pulmonary edema: Secondary | ICD-10-CM | POA: Diagnosis not present

## 2023-04-15 MED ORDER — LEVETIRACETAM 500 MG PO TABS: 1000.0000 mg | ORAL_TABLET | Freq: Every evening | ORAL | 3 refills | Status: DC

## 2023-04-15 MED ORDER — ALBUTEROL SULFATE (2.5 MG/3ML) 0.083% IN NEBU: 2.5000 mg | INHALATION_SOLUTION | RESPIRATORY_TRACT | 2 refills | Status: AC | PRN

## 2023-04-15 MED ORDER — CALCITRIOL 0.25 MCG PO CAPS: 0.7500 ug | ORAL_CAPSULE | ORAL | Status: DC

## 2023-04-15 MED ORDER — DOXYCYCLINE HYCLATE 100 MG PO TABS: 100.0000 mg | ORAL_TABLET | Freq: Two times a day (BID) | ORAL | 0 refills | Status: AC

## 2023-04-15 MED ORDER — AMLODIPINE BESYLATE 5 MG PO TABS: 5.0000 mg | ORAL_TABLET | Freq: Every day | ORAL | 3 refills | Status: DC

## 2023-04-15 MED ORDER — GUAIFENESIN ER 600 MG PO TB12: 600.0000 mg | ORAL_TABLET | Freq: Two times a day (BID) | ORAL | 2 refills | Status: DC

## 2023-04-15 MED ORDER — CLONIDINE HCL 0.1 MG PO TABS: 0.1000 mg | ORAL_TABLET | Freq: Two times a day (BID) | ORAL | 3 refills | Status: DC

## 2023-04-15 MED ORDER — IRBESARTAN 150 MG PO TABS: 150.0000 mg | ORAL_TABLET | Freq: Every day | ORAL | 3 refills | Status: DC

## 2023-04-15 MED ORDER — CEFDINIR 300 MG PO CAPS: 300.0000 mg | ORAL_CAPSULE | Freq: Every evening | ORAL | 0 refills | Status: AC

## 2023-04-15 MED ORDER — COMPRESSOR/NEBULIZER MISC: 1.0000 [IU] | 0 refills | Status: DC | PRN

## 2023-04-15 MED ORDER — CARVEDILOL 12.5 MG PO TABS: 12.5000 mg | ORAL_TABLET | Freq: Two times a day (BID) | ORAL | 3 refills | Status: DC

## 2023-04-15 NOTE — Discharge Instructions (Signed)
1)Very low-salt diet advised--less than 2 g of sodium per day 2)Weigh yourself daily, call if you gain more than 3 pounds in 1 day or more than 5 pounds in 1 week as your hemodialysis schedule may need to be adjusted 3)Limit your Fluid  intake to no more than 60 ounces (1.8 Liters) per day 4)Continue hemodialysis on Mondays Wednesdays and Fridays 5)Continue wound care at home with help of registered nurse

## 2023-04-15 NOTE — Care Management Important Message (Signed)
Important Message  Patient Details  Name: [name] MRN: [id number] Date of Birth: 01/24/1966   Important Message Given:  N/A - LOS <3 / Initial given by admissions     Corey Harold 04/15/2023, 12:14 PM

## 2023-04-15 NOTE — TOC Transition Note (Signed)
Transition of Care [facility]) - CM/SW Discharge Note   Patient Details  Name: [name] MRN: [id number] Date of Birth: 1965-08-07  Transition of Care [facility] CM/SW Contact:  Leitha Bleak, RN Phone Number: 04/15/2023, 12:10 PM   Clinical Narrative:   Patient discharging home with [facility] home health PT, Clydie Braun updated, orders placed.     Final next level of care: Home w Home Health Services Barriers to Discharge: Barriers Resolved   Patient Goals and CMS Choice CMS Medicare.gov Compare Post Acute Care list provided to:: Patient Choice offered to / list presented to : Patient  Discharge Placement                      Patient and family notified of of transfer: 04/15/23  Discharge Plan and Services Additional resources added to the After Visit Summary for       Post Acute Care Choice: Durable Medical Equipment, Home Health                               Social Determinants of Health (SDOH) Interventions SDOH Screenings   Food Insecurity: No Food Insecurity (04/13/2023)  Housing: Patient Declined (04/13/2023)  Transportation Needs: No Transportation Needs (04/13/2023)  Utilities: Not At Risk (04/13/2023)  Tobacco Use: Low Risk  (04/13/2023)     Readmission Risk Interventions    04/14/2023   11:47 AM 04/01/2023   11:36 AM 12/17/2022   11:39 AM  Readmission Risk Prevention Plan  Transportation Screening Complete Complete Complete  Medication Review [employer]) Complete Referral to Pharmacy Complete  PCP or Specialist appointment within 3-5 days of discharge Not Complete Complete Not Complete  HRI or Home Care Consult Complete Complete Complete  SW Recovery Care/Counseling Consult Complete Complete Complete  Palliative Care Screening Not Applicable Not Applicable Not Applicable  Skilled Nursing Facility Not Applicable Not Applicable Not Applicable

## 2023-04-15 NOTE — Discharge Summary (Signed)
[name], is a 57 y.o. female  DOB 02-26-1966  MRN [id number].  Admission date:  04/13/2023  Admitting Physician  Onnie Boer, MD  Discharge Date:  04/15/2023   Primary MD  Richarda Osmond, FNP  Recommendations for primary care physician for things to follow:  1)Very low-salt diet advised--less than 2 g of sodium per day 2)Weigh yourself daily, call if you gain more than 3 pounds in 1 day or more than 5 pounds in 1 week as your hemodialysis schedule may need to be adjusted 3)Limit your Fluid  intake to no more than 60 ounces (1.8 Liters) per day 4)Continue hemodialysis on Mondays Wednesdays and Fridays 5)Continue wound care at home with help of registered nurse  Admission Diagnosis  Diarrhea of presumed infectious origin [R19.7] Hyperkalemia [E87.5] Acute pulmonary edema (HCC) [J81.0] Pulmonary edema [J81.1] PNA (pneumonia) [J18.9]   Discharge Diagnosis  Diarrhea of presumed infectious origin [R19.7] Hyperkalemia [E87.5] Acute pulmonary edema (HCC) [J81.0] Pulmonary edema [J81.1] PNA (pneumonia) [J18.9]    Principal Problem:   PNA (pneumonia) Active Problems:   Sepsis (HCC)   Pulmonary edema   Diarrhea   Decubitus ulcer of buttock   ESRD (end stage renal disease) (HCC)   Suprapubic catheter (HCC)   Paraplegia (HCC)   Chronic anemia   Osteomyelitis of multiple sites-chronic       Past Medical History:  Diagnosis Date   Abnormal uterine bleeding (AUB) 06/15/2014   Cancer (HCC)    uterine   High blood pressure    Paraplegia (lower)    Renal disorder    Seizure disorder (HCC)    Seizures (HCC)    Suprapubic catheter (HCC)    Urinary tract infection     Past Surgical History:  Procedure Laterality Date   APPLICATION OF WOUND VAC Right 09/13/2021   (approximately 1-41mos ago) pressure sore on right hip   BACK SURGERY     Pt stated "before 2000"   BIOPSY   12/03/2022   Procedure: BIOPSY;  Surgeon: Dolores Frame, MD;  [facility];  Service: Gastroenterology;;   ESOPHAGOGASTRODUODENOSCOPY N/A 09/20/2015   Procedure: ESOPHAGOGASTRODUODENOSCOPY (EGD);  Surgeon: Malissa Hippo, MD;  Location: [facility];  Service: Endoscopy;  Laterality: N/A;  730   ESOPHAGOGASTRODUODENOSCOPY (EGD) WITH PROPOFOL N/A 12/03/2022   Procedure: ESOPHAGOGASTRODUODENOSCOPY (EGD) WITH PROPOFOL;  Surgeon: Dolores Frame, MD;  [facility];  Service: Gastroenterology;  Laterality: N/A;  1:15 pm, asa 3, pt knows to arrive at 10:30  dialysis pt, M,W & F   IR CATHETER TUBE CHANGE  04/02/2018   PERCUTANEOUS ENDOSCOPIC GASTROSTOMY (PEG) REMOVAL N/A 09/20/2015   Procedure: PERCUTANEOUS ENDOSCOPIC GASTROSTOMY (PEG) REMOVAL;  Surgeon: Malissa Hippo, MD;  [facility];  Service: Endoscopy;  Laterality: N/A;   TEE WITHOUT CARDIOVERSION N/A 11/11/2022   Procedure: TRANSESOPHAGEAL ECHOCARDIOGRAM (TEE);  Surgeon: Pricilla Riffle, MD;  Location: [facility];  Service: Cardiovascular;  Laterality: N/A;     HPI  from the history and physical done on the  day of admission:     Chief Complaint: Difficulty breathing   HPI: [name] is a 57 y.o. female with medical history significant for Paraplegia, suprapubic cath, ESRD, decubitus ulcers.  Patient was brought to the ED with reports of multiple episodes of profuse diarrhea that started after her hemodialysis session 2 days ago 11/15.  Due to paraplegia, she is unable to tell he has abdominal pain.  No vomiting.  She also reports increasing difficulty breathing mostly with exertion over the past few days.  No cough.  Home health nurse follows regularly with her for her chronic decubitus ulcers, and she has been told that that they are healing.   Multiple recent hospitalizations, this is patient's second hospitalization in the past 6 months.  Most Recent - 11/4- 11/5- Acute respiratory  failure with hypoxia 2/2 acute pulm edema.   ED Course: Tmax 100.9.  Heart rate 94-99.  Respirate rate 16-26.  Blood pressure systolic 150s to 962X.  O2 sats greater than 92% on room air.  WBC 11.8.  BUN 66.  Potassium 5.8. Chest x-ray shows peribronchial airspace consolidation in the lower lobes, infectious consolidation and atelectasis, also shows interstitial pulmonary edema and small pleural effusion. EDP Dr. Dr. Arlean Hopping, plan for HD in a.m., recommends no IV fluids tonight. Lokelma, 5 mg, Imodium given.  Levaquin started.   Review of Systems: As per HPI all other systems reviewed and negative.     Hospital Course:     1)Sepsis due to  PNA/CAP--POA Received Levaquin in ED -WBC 11.8 >> 11.5  -Tmax 100.9 T-current 98.1 -Treated with Rocephin and doxycycline COVID Neg -No further fevers, clinically improved -Okay to discharge on Omnicef and doxycycline -Continue bronchodilators and mucolytics   2)Diarrhea -PTA patient apparently had profuse diarrhea.  No vomiting.   -Paraplegic so unable to tell if abdominal pain if present.   -No diarrhea since admission, actually no BM in the last 24 hours--unable to obtain stool samples for studies as patient had no further BMs   3)Volume overload/Pulmonary edema--in an ESRD patient MWF Chest x-ray showing interstitial pulmonary edema, small pleural effusions not excluded.  O2 sat 92 to 98% on room air.  Reports she has not missed any HD sessions.   -Tolerated hemodialysis well Continue to use hemodialysis to address volume status. -Nephrology consult appreciated   4)Bilateral sacral and buttock decubitus ulcers, both of them appear clean, with no evidence of surrounding cellulitis or wound infection. --- Wound care consult appreciated   5)Hyperkalemia--received Lokelma Continue to use hemodialysis to address electrolyte issues   6)Chronic Anemia of ESRD Hemoglobin stable around 9 -Defer decision on Procrit/ESA agent to nephrology  team   7)Paraplegia /ambulatory dysfunction-neurogenic bladder - chronic indwelling catheter. -- Supportive care   8)HTN---resume Coreg, amlodipine and clonidine   9) seizure disorder--continue Keppra 1000 mg nightly   10)GERD-continue Protonix 40 mg daily  11) low serum calcium--when corrected for low albumin serum calcium is actually normal    Discharge Condition: Stable  Follow UP   Follow-up Information     [facility] Follow up.   Why: They will call to schedule your next home visit.                 Consults obtained -nephrology  Diet and Activity recommendation:  As advised  Discharge Instructions    Discharge Instructions     Call MD for:  difficulty breathing, headache or visual disturbances   Complete by: As directed  Call MD for:  persistant dizziness or light-headedness   Complete by: As directed    Call MD for:  persistant nausea and vomiting   Complete by: As directed    Call MD for:  temperature >100.4   Complete by: As directed    Diet - low sodium heart healthy   Complete by: As directed    Discharge instructions   Complete by: As directed    1)Very low-salt diet advised--less than 2 g of sodium per day 2)Weigh yourself daily, call if you gain more than 3 pounds in 1 day or more than 5 pounds in 1 week as your hemodialysis schedule may need to be adjusted 3)Limit your Fluid  intake to no more than 60 ounces (1.8 Liters) per day 4)Continue hemodialysis on Mondays Wednesdays and Fridays 5)Continue wound care at home with help of registered nurse   Discharge wound care:   Complete by: As directed    Wound care as above   For home use only DME Nebulizer machine   Complete by: As directed    Patient needs a nebulizer to treat with the following condition: Dyspnea and respiratory abnormalities   Length of Need: Lifetime   Increase activity slowly   Complete by: As directed        Discharge Medications      Allergies as of 04/15/2023       Reactions   Benadryl [diphenhydramine Hcl (sleep)] Hives   Daptomycin Hives   Linezolid    Other Reaction(s): GI Intolerance Patient self-discontinued treatment due to GI intolerance. Taking it along with moxifloxacin   Moxifloxacin    Other Reaction(s): GI Intolerance Patient self-discontinued treatment due to GI intolerance. Taking it along with linezolid   Quinine Derivatives Other (See Comments)   Alters mental status   Vancomycin    Pt is tolerating this medication at HD   Azithromycin Itching, Rash   Tetracycline Itching   Able to tolerate Doxycycline.    Zosyn [piperacillin Sod-tazobactam So] Rash        Medication List     STOP taking these medications    torsemide 100 MG tablet Commonly known as: DEMADEX       TAKE these medications    acetaminophen 500 MG tablet Commonly known as: TYLENOL Take 1,000 mg by mouth every 6 (six) hours as needed for moderate pain.   albuterol (2.5 MG/3ML) 0.083% nebulizer solution Commonly known as: PROVENTIL Take 3 mLs (2.5 mg total) by nebulization every 4 (four) hours as needed for wheezing or shortness of breath.   amLODipine 5 MG tablet Commonly known as: NORVASC Take 1 tablet (5 mg total) by mouth daily. Take 1 tablet on Monday,Wednesday and Friday then take 2 tablets on Tuesday,Thursday,Saturday and Sunday.   carvedilol 12.5 MG tablet Commonly known as: COREG Take 1 tablet (12.5 mg total) by mouth 2 (two) times daily with a meal.   cefdinir 300 MG capsule Commonly known as: OMNICEF Take 1 capsule (300 mg total) by mouth every evening for 5 days.   cloNIDine 0.1 MG tablet Commonly known as: CATAPRES Take 1 tablet (0.1 mg total) by mouth 2 (two) times daily.   Compressor/Nebulizer Misc 1 Units by Does not apply route as needed.   cyclobenzaprine 5 MG tablet Commonly known as: FLEXERIL Take 5 mg by mouth 3 (three) times daily as needed for muscle spasms.   doxycycline 100  MG tablet Commonly known as: VIBRA-TABS Take 1 tablet (100 mg total) by mouth 2 (two)  times daily for 5 days.   guaiFENesin 600 MG 12 hr tablet Commonly known as: Mucinex Take 1 tablet (600 mg total) by mouth 2 (two) times daily.   irbesartan 150 MG tablet Commonly known as: AVAPRO Take 1 tablet (150 mg total) by mouth daily at 6 PM.   levETIRAcetam 500 MG tablet Commonly known as: KEPPRA Take 2 tablets (1,000 mg total) by mouth every evening. Take an additional tablet on Monday,Wednesday and Friday evening after Dialysis   lidocaine-prilocaine cream Commonly known as: EMLA Apply 1 Application topically 3 (three) times a week.   naloxone 4 MG/0.1ML Liqd nasal spray kit Commonly known as: NARCAN Place 0.4 mg into the nose once.   pantoprazole 40 MG tablet Commonly known as: PROTONIX Take 40 mg by mouth daily.   senna-docusate 8.6-50 MG tablet Commonly known as: Senokot-S Take 2 tablets by mouth at bedtime.   sevelamer carbonate 800 MG tablet Commonly known as: RENVELA Take 800 mg by mouth 3 (three) times daily.               Durable Medical Equipment  (From admission, onward)           Start     Ordered   04/15/23 0000  For home use only DME Nebulizer machine       Question Answer Comment  Patient needs a nebulizer to treat with the following condition Dyspnea and respiratory abnormalities   Length of Need Lifetime      04/15/23 1212              Discharge Care Instructions  (From admission, onward)           Start     Ordered   04/15/23 0000  Discharge wound care:       Comments: Wound care as above   04/15/23 1212            Major procedures and Radiology Reports - PLEASE review detailed and final reports for all details, in brief -   DG Chest Port 1 View  Result Date: 04/13/2023 CLINICAL DATA:  Shortness of breath. EXAM: PORTABLE CHEST 1 VIEW COMPARISON:  March 31, 2023 FINDINGS: Enlarged cardiac silhouette.  Mediastinal  contours appear intact. Interstitial pulmonary edema. Peribronchial airspace consolidation in the lower lobes. Small pleural effusions cannot be excluded. Stable spinal fusion.  Soft tissues are grossly normal. IMPRESSION: 1. Peribronchial airspace consolidation the lower lobes. This may represent infectious consolidation or atelectasis. 2. Interstitial pulmonary edema. Small pleural effusions cannot be excluded. 3. Enlarged cardiac silhouette. Electronically Signed   By: Ted Mcalpine M.D.   On: 04/13/2023 17:25   ECHOCARDIOGRAM COMPLETE  Result Date: 04/01/2023    ECHOCARDIOGRAM REPORT   Patient Name:   [name] Date of Exam: 04/01/2023 Medical Rec #:  [id number]        Height:       62.0 in Accession #:    [id number]       Weight:       116.0 lb Date of Birth:  1966-04-08         BSA:          1.516 m Patient Age:    57 years         BP:           169/94 mmHg Patient Gender: F                HR:  79 bpm. Exam Location:  Inpatient Procedure: 2D Echo, Cardiac Doppler and Color Doppler Indications:    Abnormal ECG  History:        Patient has prior history of Echocardiogram examinations, most                 recent 11/09/2022. ESRD; Risk Factors:Hypertension and                 Non-Smoker.  Sonographer:    Dondra Prader RVT RCS Referring Phys: Lorin Glass IMPRESSIONS  1. Left ventricular ejection fraction, by estimation, is 45 to 50%. The left ventricle has mildly decreased function. The left ventricle demonstrates global hypokinesis. There is mild concentric left ventricular hypertrophy. Left ventricular diastolic parameters are consistent with Grade II diastolic dysfunction (pseudonormalization).  2. Right ventricular systolic function is mildly reduced. The right ventricular size is mildly enlarged. There is normal pulmonary artery systolic pressure. The estimated right ventricular systolic pressure is 28.2 mmHg.  3. Left atrial size was mildly dilated.  4. Right atrial size was mildly  dilated.  5. The mitral valve is grossly normal. Mild mitral valve regurgitation. No evidence of mitral stenosis.  6. The aortic valve is tricuspid. Aortic valve regurgitation is not visualized. No aortic stenosis is present.  7. The inferior vena cava is normal in size with greater than 50% respiratory variability, suggesting right atrial pressure of 3 mmHg. Comparison(s): No significant change from prior study. FINDINGS  Left Ventricle: Left ventricular ejection fraction, by estimation, is 45 to 50%. The left ventricle has mildly decreased function. The left ventricle demonstrates global hypokinesis. The left ventricular internal cavity size was normal in size. There is  mild concentric left ventricular hypertrophy. Left ventricular diastolic parameters are consistent with Grade II diastolic dysfunction (pseudonormalization). Right Ventricle: The right ventricular size is mildly enlarged. No increase in right ventricular wall thickness. Right ventricular systolic function is mildly reduced. There is normal pulmonary artery systolic pressure. The tricuspid regurgitant velocity  is 2.51 m/s, and with an assumed right atrial pressure of 3 mmHg, the estimated right ventricular systolic pressure is 28.2 mmHg. Left Atrium: Left atrial size was mildly dilated. Right Atrium: Right atrial size was mildly dilated. Pericardium: Trivial pericardial effusion is present. Mitral Valve: The mitral valve is grossly normal. Mild mitral valve regurgitation. No evidence of mitral valve stenosis. Tricuspid Valve: The tricuspid valve is grossly normal. Tricuspid valve regurgitation is mild . No evidence of tricuspid stenosis. Aortic Valve: The aortic valve is tricuspid. Aortic valve regurgitation is not visualized. No aortic stenosis is present. Aortic valve mean gradient measures 3.0 mmHg. Aortic valve peak gradient measures 5.5 mmHg. Aortic valve area, by VTI measures 3.29 cm. Pulmonic Valve: The pulmonic valve was grossly normal.  Pulmonic valve regurgitation is not visualized. No evidence of pulmonic stenosis. Aorta: The aortic root and ascending aorta are structurally normal, with no evidence of dilitation. Venous: The inferior vena cava is normal in size with greater than 50% respiratory variability, suggesting right atrial pressure of 3 mmHg. IAS/Shunts: The atrial septum is grossly normal.  LEFT VENTRICLE PLAX 2D LVIDd:         5.20 cm   Diastology LVIDs:         3.40 cm   LV e' medial:    3.38 cm/s LV PW:         1.10 cm   LV E/e' medial:  31.1 LV IVS:        1.10 cm   LV e' lateral:  5.06 cm/s LVOT diam:     2.10 cm   LV E/e' lateral: 20.8 LV SV:         72 LV SV Index:   48 LVOT Area:     3.46 cm  RIGHT VENTRICLE             IVC RV S prime:     11.70 cm/s  IVC diam: 1.50 cm TAPSE (M-mode): 2.5 cm LEFT ATRIUM             Index        RIGHT ATRIUM           Index LA diam:        3.50 cm 2.31 cm/m   RA Area:     15.80 cm LA Vol (A2C):   39.2 ml 25.82 ml/m  RA Volume:   52.00 ml  34.29 ml/m LA Vol (A4C):   54.5 ml 35.94 ml/m LA Biplane Vol: 53.7 ml 35.41 ml/m  AORTIC VALVE                    PULMONIC VALVE AV Area (Vmax):    3.32 cm     PV Vmax:       0.70 m/s AV Area (Vmean):   3.27 cm     PV Peak grad:  2.0 mmHg AV Area (VTI):     3.29 cm AV Vmax:           117.00 cm/s AV Vmean:          80.800 cm/s AV VTI:            0.220 m AV Peak Grad:      5.5 mmHg AV Mean Grad:      3.0 mmHg LVOT Vmax:         112.00 cm/s LVOT Vmean:        76.400 cm/s LVOT VTI:          0.209 m LVOT/AV VTI ratio: 0.95  AORTA Ao Root diam: 3.60 cm Ao Asc diam:  3.40 cm MITRAL VALVE                TRICUSPID VALVE MV Area (PHT): 4.89 cm     TR Peak grad:   25.2 mmHg MV Decel Time: 155 msec     TR Vmax:        251.00 cm/s MV E velocity: 105.00 cm/s MV A velocity: 87.00 cm/s   SHUNTS MV E/A ratio:  1.21         Systemic VTI:  0.21 m                             Systemic Diam: 2.10 cm Christina Odor MD Electronically signed by Christina Odor MD Signature  Date/Time: 04/01/2023/4:14:42 PM    Final    DG Chest Port 1 View  Result Date: 03/31/2023 CLINICAL DATA:  Shortness of breath. EXAM: PORTABLE CHEST 1 VIEW COMPARISON:  March 17, 2023. FINDINGS: Stable cardiomegaly. Status post fusion of thoracic spine. Bibasilar opacities are noted concerning for edema or atelectasis with associated pleural effusions. IMPRESSION: Stable bibasilar edema or atelectasis is noted with associated pleural effusions. Electronically Signed   By: Lupita Raider M.D.   On: 03/31/2023 12:50   DG Chest 2 View  Result Date: 03/17/2023 CLINICAL DATA:  Chest pain over the last few days.  Dialysis patient EXAM: CHEST - 2 VIEW COMPARISON:  X-ray 03/06/2023  and older FINDINGS: Extensive Harrington rods along thoracic spine. Enlarged cardiopericardial silhouette. Films are rotated. Central vascular congestion with moderate pleural effusions. No pneumothorax. No frank edema. There is also some confluence opacity along both lung bases. The effusions in the opacities are increased from prior x-ray. Overlapping cardiac leads. IMPRESSION: Rotated radiographs. Increasing pleural effusions and opacities at the bases. Enlarged heart with increasing vascular congestion. Electronically Signed   By: Karen Kays M.D.   On: 03/17/2023 12:06    Micro Results   Recent Results (from the past 240 hour(s))  Culture, blood (Routine X 2) w Reflex to ID Panel     Status: None (Preliminary result)   Collection Time: 04/13/23  9:25 PM   Specimen: BLOOD LEFT HAND  Result Value Ref Range Status   Specimen Description BLOOD LEFT HAND  Final   Special Requests   Final    BOTTLES DRAWN AEROBIC ONLY Blood Culture adequate volume   Culture   Final    NO GROWTH 2 DAYS Performed at [facility], [address] [place], [place] [address]    Report Status PENDING  Incomplete  Culture, blood (Routine X 2) w Reflex to ID Panel     Status: None (Preliminary result)   Collection Time: 04/13/23  9:31 PM    Specimen: BLOOD RIGHT FOREARM  Result Value Ref Range Status   Specimen Description BLOOD RIGHT FOREARM  Final   Special Requests   Final    BOTTLES DRAWN AEROBIC AND ANAEROBIC Blood Culture adequate volume   Culture   Final    NO GROWTH 2 DAYS Performed at [facility], [address], [place], [place] [address]    Report Status PENDING  Incomplete  SARS Coronavirus 2 by RT PCR (hospital order, performed in [facility] hospital lab) *cepheid single result test* Urine, Catheterized     Status: None   Collection Time: 04/14/23  4:30 AM   Specimen: Urine, Catheterized; Nasal Swab  Result Value Ref Range Status   SARS Coronavirus 2 by RT PCR NEGATIVE NEGATIVE Final    Comment: (NOTE) SARS-CoV-2 target nucleic acids are NOT DETECTED.  The SARS-CoV-2 RNA is generally detectable in upper and lower respiratory specimens during the acute phase of infection. The lowest concentration of SARS-CoV-2 viral copies this assay can detect is 250 copies / mL. A negative result does not preclude SARS-CoV-2 infection and should not be used as the sole basis for treatment or other patient management decisions.  A negative result may occur with improper specimen collection / handling, submission of specimen other than nasopharyngeal swab, presence of viral mutation(s) within the areas targeted by this assay, and inadequate number of viral copies (<250 copies / mL). A negative result must be combined with clinical observations, patient history, and epidemiological information.  Fact Sheet for Patients:   RoadLapTop.co.za  Fact Sheet for Healthcare Providers: http://kim-miller.com/  This test is not yet approved or  cleared by the [place] FDA and has been authorized for detection and/or diagnosis of SARS-CoV-2 by FDA under an Emergency Use Authorization (EUA).  This EUA will remain in effect (meaning this test can be used) for the duration of  the COVID-19 declaration under Section 564(b)(1) of the Act, 21 U.S.C. section 360bbb-3(b)(1), unless the authorization is terminated or revoked sooner.  Performed at [facility], [address] [place], [place] [address]    Today   Subjective    [name] today has no new complaints -Tolerated hemodialysis well  No Nausea or  Vomiting  -No further diarrhea No fever  Or chills           Patient has been seen and examined prior to discharge   Objective   Blood pressure (!) 169/67, pulse 87, temperature 98.2 F (36.8 C), temperature source Oral, resp. rate 20, height 5\' 2"  (1.575 m), weight 51.7 kg, SpO2 96%.   Intake/Output Summary (Last 24 hours) at 04/15/2023 1218 Last data filed at 04/15/2023 0730 Gross per 24 hour  Intake 600 ml  Output 3050 ml  Net -2450 ml   Exam Gen:- Awake Alert, no acute distress HEENT:- [place].AT, No sclera icterus Neck-Supple Neck,No JVD,.  Lungs-  CTAB , good air movement bilaterally CV- S1, S2 normal, regular Abd-  +ve B.Sounds, Abd Soft, No tenderness,   Suprapubic catheter  Extremity/Skin:- No  edema, pedal pulses present , bilateral sacral/buttock decubitus ulcers without significant erythema or drainage--- see photos in epic Psych-affect is appropriate, oriented x3 Neuro-generalized weakness, neuromuscular deficits with contractures consistent with paraplegic status  GU--chronic suprapubic catheter in situ MSK-AV fistula with bruit and thrill   Data Review   CBC w Diff:  Lab Results  Component Value Date   WBC 11.5 (H) 04/14/2023   HGB 9.0 (L) 04/14/2023   HCT 30.4 (L) 04/14/2023   PLT 165 04/14/2023   LYMPHOPCT 6 04/13/2023   MONOPCT 2 04/13/2023   EOSPCT 0 04/13/2023   BASOPCT 0 04/13/2023   CMP:  Lab Results  Component Value Date   NA 128 (L) 04/14/2023   NA 137 06/14/2014   K 4.7 04/14/2023   CL 93 (L) 04/14/2023   CO2 22 04/14/2023   BUN 71 (H) 04/14/2023   BUN 10 06/14/2014   CREATININE 5.81 (H)  04/14/2023   CREATININE 0.76 04/18/2015   PROT 7.7 04/13/2023   PROT 5.4 (L) 06/14/2014   ALBUMIN 2.6 (L) 04/13/2023   ALBUMIN 2.4 (L) 06/14/2014   BILITOT 0.8 04/13/2023   ALKPHOS 97 04/13/2023   AST 21 04/13/2023   ALT 17 04/13/2023  . Total Discharge time is about 33 minutes  Shon Hale M.D on 04/15/2023 at 12:18 PM  Go to [email or web address] -  for contact info  [facility] - Office  [phone]

## 2023-04-15 NOTE — Progress Notes (Signed)
[facility] Progress Note    Assessment/ Plan:   ESRD -outpatient HD orders: [facility].  MWF.  3 hours.  EDW 44.5 kg.  LUE AVF.  Flow rates: 400/500.  3K bath.  Heparin: No.  Meds: Calcitriol 0.75 mcg with treatment, Mircera 175 mcg (due 11/18), Venofer 50 mg (due 11/18) -HD tomorrow per MWF schedule   Sepsis CAP -abx per primary service   Diarrhea -w/u per primary   Decubitus ulcer -wound care following   Hyponatremia -will attempt to manage with HD/UF   Hyperkalemia -Improved with med management, HD plan as above   Volume/ hypertension  -UF as tolerated   Anemia of Chronic Kidney Disease Hemoglobin 9. received ESA 11/18, holding off on iron for now given sepsis.  -Transfuse PRN for Hgb <7   Secondary Hyperparathyroidism/Hyperphosphatemia - resume home binders if on any, will resume calctriol   Subjective:   Patient seen and examined bedside this AM. S/p HD yesterday, net UF 2.8L, patient requested to end treatment 15 min earlier. Patient reports some SOB this AM, received breath treatment and feels a little better. Currently going for CXR. She reports that she tolerated HD yesterday.   Objective:   BP (!) 169/67 (BP Location: Right Wrist)   Pulse 87   Temp 98.2 F (36.8 C) (Oral)   Resp 20   Ht 5\' 2"  (1.575 m)   Wt 51.7 kg   LMP  (LMP Unknown)   SpO2 96%   BMI 20.85 kg/m   Intake/Output Summary (Last 24 hours) at 04/15/2023 0736 Last data filed at 04/15/2023 0535 Gross per 24 hour  Intake 600 ml  Output 3050 ml  Net -2450 ml   Weight change: 0.4 kg  Physical Exam: Gen: NAD, laying flat in bed CVS: RRR Resp: CTA b/l no w/r/r/c appreciated Abd: suprapubic catheter Ext:no edema Neuro: awake, alert Dialysis access: LUE AVF +b/t  Imaging: DG Chest Port 1 View  Result Date: 04/13/2023 CLINICAL DATA:  Shortness of breath. EXAM: PORTABLE CHEST 1 VIEW COMPARISON:  March 31, 2023 FINDINGS: Enlarged cardiac silhouette.   Mediastinal contours appear intact. Interstitial pulmonary edema. Peribronchial airspace consolidation in the lower lobes. Small pleural effusions cannot be excluded. Stable spinal fusion.  Soft tissues are grossly normal. IMPRESSION: 1. Peribronchial airspace consolidation the lower lobes. This may represent infectious consolidation or atelectasis. 2. Interstitial pulmonary edema. Small pleural effusions cannot be excluded. 3. Enlarged cardiac silhouette. Electronically Signed   By: Ted Mcalpine M.D.   On: 04/13/2023 17:25    Labs: BMET Recent Labs  Lab 04/13/23 1717 04/14/23 0420  NA 133* 128*  K 5.8* 4.7  CL 95* 93*  CO2 26 22  GLUCOSE 112* 101*  BUN 66* 71*  CREATININE 5.82* 5.81*  CALCIUM 8.6* 8.4*   CBC Recent Labs  Lab 04/13/23 1717 04/14/23 0420  WBC 11.8* 11.5*  NEUTROABS 10.7*  --   HGB 10.5* 9.0*  HCT 35.9* 30.4*  MCV 96.0 93.5  PLT 176 165    Medications:     amLODipine  5 mg Oral Daily   carvedilol  12.5 mg Oral BID WC   Chlorhexidine Gluconate Cloth  6 each Topical Q0600   cloNIDine  0.1 mg Oral BID   dextromethorphan-guaiFENesin  1 tablet Oral BID   heparin  5,000 Units Subcutaneous Q8H   ipratropium-albuterol  3 mL Nebulization Q6H   levETIRAcetam  1,000 mg Oral QHS   pantoprazole  40 mg Oral Daily      Anthony Sar,  MD [facility] 04/15/2023, 7:36 AM

## 2023-04-18 LAB — CULTURE, BLOOD (ROUTINE X 2)
Culture: NO GROWTH
Culture: NO GROWTH
Special Requests: ADEQUATE
Special Requests: ADEQUATE

## 2023-05-02 ENCOUNTER — Encounter (HOSPITAL_COMMUNITY): Payer: Self-pay

## 2023-05-02 ENCOUNTER — Other Ambulatory Visit: Payer: Self-pay

## 2023-05-02 ENCOUNTER — Emergency Department (HOSPITAL_COMMUNITY): Payer: 59

## 2023-05-02 ENCOUNTER — Inpatient Hospital Stay (HOSPITAL_COMMUNITY)
Admission: EM | Admit: 2023-05-02 | Discharge: 2023-05-05 | DRG: 177 | Disposition: A | Discharge status: Home Health | Payer: 59 | Attending: Internal Medicine | Admitting: Internal Medicine

## 2023-05-02 DIAGNOSIS — Z87440 Personal history of urinary (tract) infections: Secondary | ICD-10-CM

## 2023-05-02 DIAGNOSIS — L89314 Pressure ulcer of right buttock, stage 4: Secondary | ICD-10-CM | POA: Diagnosis present

## 2023-05-02 DIAGNOSIS — J90 Pleural effusion, not elsewhere classified: Secondary | ICD-10-CM | POA: Diagnosis present

## 2023-05-02 DIAGNOSIS — A481 Legionnaires' disease: Principal | ICD-10-CM

## 2023-05-02 DIAGNOSIS — N186 End stage renal disease: Secondary | ICD-10-CM | POA: Diagnosis present

## 2023-05-02 DIAGNOSIS — E8889 Other specified metabolic disorders: Secondary | ICD-10-CM | POA: Diagnosis present

## 2023-05-02 DIAGNOSIS — Z888 Allergy status to other drugs, medicaments and biological substances status: Secondary | ICD-10-CM | POA: Diagnosis not present

## 2023-05-02 DIAGNOSIS — G40909 Epilepsy, unspecified, not intractable, without status epilepticus: Secondary | ICD-10-CM

## 2023-05-02 DIAGNOSIS — Z881 Allergy status to other antibiotic agents status: Secondary | ICD-10-CM | POA: Diagnosis not present

## 2023-05-02 DIAGNOSIS — J9601 Acute respiratory failure with hypoxia: Principal | ICD-10-CM | POA: Diagnosis present

## 2023-05-02 DIAGNOSIS — G8220 Paraplegia, unspecified: Secondary | ICD-10-CM | POA: Diagnosis present

## 2023-05-02 DIAGNOSIS — Z79899 Other long term (current) drug therapy: Secondary | ICD-10-CM

## 2023-05-02 DIAGNOSIS — D631 Anemia in chronic kidney disease: Secondary | ICD-10-CM | POA: Diagnosis present

## 2023-05-02 DIAGNOSIS — J810 Acute pulmonary edema: Secondary | ICD-10-CM | POA: Diagnosis present

## 2023-05-02 DIAGNOSIS — I1311 Hypertensive heart and chronic kidney disease without heart failure, with stage 5 chronic kidney disease, or end stage renal disease: Secondary | ICD-10-CM | POA: Diagnosis present

## 2023-05-02 DIAGNOSIS — R531 Weakness: Secondary | ICD-10-CM | POA: Diagnosis present

## 2023-05-02 DIAGNOSIS — Z803 Family history of malignant neoplasm of breast: Secondary | ICD-10-CM

## 2023-05-02 DIAGNOSIS — L89324 Pressure ulcer of left buttock, stage 4: Secondary | ICD-10-CM | POA: Diagnosis present

## 2023-05-02 DIAGNOSIS — Z8249 Family history of ischemic heart disease and other diseases of the circulatory system: Secondary | ICD-10-CM

## 2023-05-02 DIAGNOSIS — Z992 Dependence on renal dialysis: Secondary | ICD-10-CM | POA: Diagnosis not present

## 2023-05-02 DIAGNOSIS — K219 Gastro-esophageal reflux disease without esophagitis: Secondary | ICD-10-CM | POA: Diagnosis present

## 2023-05-02 DIAGNOSIS — I10 Essential (primary) hypertension: Secondary | ICD-10-CM | POA: Diagnosis not present

## 2023-05-02 DIAGNOSIS — J189 Pneumonia, unspecified organism: Secondary | ICD-10-CM | POA: Diagnosis present

## 2023-05-02 LAB — URINALYSIS, ROUTINE W REFLEX MICROSCOPIC
Bilirubin Urine: NEGATIVE
Glucose, UA: >=500 mg/dL — AB
Ketones, ur: NEGATIVE mg/dL
Nitrite: NEGATIVE
Protein, ur: >=300 mg/dL — AB
Specific Gravity, Urine: 1.01 (ref 1.005–1.030)
pH: 8 (ref 5.0–8.0)

## 2023-05-02 LAB — CBC WITH DIFFERENTIAL/PLATELET
Abs Immature Granulocytes: 0.02 10*3/uL (ref 0.00–0.07)
Basophils Absolute: 0.1 10*3/uL (ref 0.0–0.1)
Basophils Relative: 1 %
Eosinophils Absolute: 0.1 10*3/uL (ref 0.0–0.5)
Eosinophils Relative: 2 %
HCT: 36.6 % (ref 36.0–46.0)
Hemoglobin: 10.8 g/dL — ABNORMAL LOW (ref 12.0–15.0)
Immature Granulocytes: 0 %
Lymphocytes Relative: 18 %
Lymphs Abs: 1.1 10*3/uL (ref 0.7–4.0)
MCH: 28.8 pg (ref 26.0–34.0)
MCHC: 29.5 g/dL — ABNORMAL LOW (ref 30.0–36.0)
MCV: 97.6 fL (ref 80.0–100.0)
Monocytes Absolute: 0.4 10*3/uL (ref 0.1–1.0)
Monocytes Relative: 7 %
Neutro Abs: 4.3 10*3/uL (ref 1.7–7.7)
Neutrophils Relative %: 72 %
Platelets: 180 10*3/uL (ref 150–400)
RBC: 3.75 MIL/uL — ABNORMAL LOW (ref 3.87–5.11)
RDW: 19.7 % — ABNORMAL HIGH (ref 11.5–15.5)
WBC: 5.9 10*3/uL (ref 4.0–10.5)
nRBC: 0 % (ref 0.0–0.2)

## 2023-05-02 LAB — COMPREHENSIVE METABOLIC PANEL
ALT: 12 U/L (ref 0–44)
AST: 14 U/L — ABNORMAL LOW (ref 15–41)
Albumin: 2.9 g/dL — ABNORMAL LOW (ref 3.5–5.0)
Alkaline Phosphatase: 98 U/L (ref 38–126)
Anion gap: 13 (ref 5–15)
BUN: 56 mg/dL — ABNORMAL HIGH (ref 6–20)
CO2: 25 mmol/L (ref 22–32)
Calcium: 9.1 mg/dL (ref 8.9–10.3)
Chloride: 97 mmol/L — ABNORMAL LOW (ref 98–111)
Creatinine, Ser: 5.07 mg/dL — ABNORMAL HIGH (ref 0.44–1.00)
GFR, Estimated: 9 mL/min — ABNORMAL LOW (ref >60)
Glucose, Bld: 105 mg/dL — ABNORMAL HIGH (ref 70–99)
Potassium: 4.5 mmol/L (ref 3.5–5.1)
Sodium: 135 mmol/L (ref 135–145)
Total Bilirubin: 0.5 mg/dL (ref <1.2)
Total Protein: 7.8 g/dL (ref 6.5–8.1)

## 2023-05-02 LAB — PHOSPHORUS: Phosphorus: 5.3 mg/dL — ABNORMAL HIGH (ref 2.5–4.6)

## 2023-05-02 LAB — RESP PANEL BY RT-PCR (RSV, FLU A&B, COVID)  RVPGX2
Influenza A by PCR: NEGATIVE
Influenza B by PCR: NEGATIVE
Resp Syncytial Virus by PCR: NEGATIVE
SARS Coronavirus 2 by RT PCR: NEGATIVE

## 2023-05-02 LAB — CBG MONITORING, ED: Glucose-Capillary: 97 mg/dL (ref 70–99)

## 2023-05-02 LAB — HIV ANTIBODY (ROUTINE TESTING W REFLEX): HIV Screen 4th Generation wRfx: NONREACTIVE

## 2023-05-02 LAB — HEPATITIS B SURFACE ANTIGEN: Hepatitis B Surface Ag: NONREACTIVE

## 2023-05-02 LAB — MAGNESIUM: Magnesium: 2.2 mg/dL (ref 1.7–2.4)

## 2023-05-02 LAB — STREP PNEUMONIAE URINARY ANTIGEN: Strep Pneumo Urinary Antigen: NEGATIVE

## 2023-05-02 MED ORDER — ONDANSETRON HCL 4 MG/2ML IJ SOLN: 4.0000 mg | Freq: Four times a day (QID) | INTRAMUSCULAR | Status: DC | PRN

## 2023-05-02 MED ORDER — DOXYCYCLINE HYCLATE 100 MG PO TABS
100.0000 mg | ORAL_TABLET | Freq: Two times a day (BID) | ORAL | Status: DC
  Administered 2023-05-02 – 2023-05-05 (×6): 100 mg via ORAL
  Filled 2023-05-02 (×6): qty 1

## 2023-05-02 MED ORDER — HEPARIN SODIUM (PORCINE) 5000 UNIT/ML IJ SOLN
5000.0000 [IU] | Freq: Three times a day (TID) | INTRAMUSCULAR | Status: DC
  Administered 2023-05-02 – 2023-05-05 (×8): 5000 [IU] via SUBCUTANEOUS
  Filled 2023-05-02 (×8): qty 1

## 2023-05-02 MED ORDER — SEVELAMER CARBONATE 800 MG PO TABS
800.0000 mg | ORAL_TABLET | Freq: Three times a day (TID) | ORAL | Status: DC
  Administered 2023-05-02 – 2023-05-05 (×10): 800 mg via ORAL
  Filled 2023-05-02 (×11): qty 1

## 2023-05-02 MED ORDER — ALBUTEROL SULFATE (2.5 MG/3ML) 0.083% IN NEBU: 2.5000 mg | INHALATION_SOLUTION | Freq: Four times a day (QID) | RESPIRATORY_TRACT | Status: DC | PRN

## 2023-05-02 MED ORDER — SODIUM CHLORIDE 0.9 % IV SOLN
2.0000 g | INTRAVENOUS | Status: DC
  Administered 2023-05-03 – 2023-05-05 (×3): 2 g via INTRAVENOUS
  Filled 2023-05-02 (×3): qty 20

## 2023-05-02 MED ORDER — ACETAMINOPHEN 650 MG RE SUPP: 650.0000 mg | Freq: Four times a day (QID) | RECTAL | Status: DC | PRN

## 2023-05-02 MED ORDER — BUDESONIDE 0.5 MG/2ML IN SUSP
0.5000 mg | Freq: Two times a day (BID) | RESPIRATORY_TRACT | Status: DC
  Administered 2023-05-02 – 2023-05-05 (×6): 0.5 mg via RESPIRATORY_TRACT
  Filled 2023-05-02 (×7): qty 2

## 2023-05-02 MED ORDER — SODIUM CHLORIDE 0.9% FLUSH: 3.0000 mL | INTRAVENOUS | Status: DC | PRN

## 2023-05-02 MED ORDER — SODIUM CHLORIDE 0.9% FLUSH
3.0000 mL | Freq: Two times a day (BID) | INTRAVENOUS | Status: DC
  Administered 2023-05-02 – 2023-05-05 (×6): 3 mL via INTRAVENOUS

## 2023-05-02 MED ORDER — CLONIDINE HCL 0.1 MG PO TABS
0.1000 mg | ORAL_TABLET | Freq: Two times a day (BID) | ORAL | Status: DC
  Administered 2023-05-02 – 2023-05-04 (×5): 0.1 mg via ORAL
  Filled 2023-05-02 (×6): qty 1

## 2023-05-02 MED ORDER — ONDANSETRON HCL 4 MG PO TABS: 4.0000 mg | ORAL_TABLET | Freq: Four times a day (QID) | ORAL | Status: DC | PRN

## 2023-05-02 MED ORDER — HYDRALAZINE HCL 20 MG/ML IJ SOLN
10.0000 mg | Freq: Three times a day (TID) | INTRAMUSCULAR | Status: DC | PRN
  Administered 2023-05-03: 10 mg via INTRAVENOUS
  Filled 2023-05-02: qty 1

## 2023-05-02 MED ORDER — SENNOSIDES-DOCUSATE SODIUM 8.6-50 MG PO TABS
2.0000 | ORAL_TABLET | Freq: Every day | ORAL | Status: DC
  Administered 2023-05-03 – 2023-05-04 (×2): 2 via ORAL
  Filled 2023-05-02 (×3): qty 2

## 2023-05-02 MED ORDER — SODIUM CHLORIDE 0.9 % IV SOLN: 250.0000 mL | INTRAVENOUS | Status: AC | PRN

## 2023-05-02 MED ORDER — CHLORHEXIDINE GLUCONATE CLOTH 2 % EX PADS: 6.0000 | MEDICATED_PAD | Freq: Every day | CUTANEOUS | Status: DC

## 2023-05-02 MED ORDER — PANTOPRAZOLE SODIUM 40 MG PO TBEC
40.0000 mg | DELAYED_RELEASE_TABLET | Freq: Every day | ORAL | Status: DC
  Administered 2023-05-02 – 2023-05-05 (×4): 40 mg via ORAL
  Filled 2023-05-02 (×4): qty 1

## 2023-05-02 MED ORDER — LEVETIRACETAM 500 MG PO TABS
1000.0000 mg | ORAL_TABLET | Freq: Every day | ORAL | Status: DC
  Administered 2023-05-02 – 2023-05-05 (×4): 1000 mg via ORAL
  Filled 2023-05-02 (×4): qty 2

## 2023-05-02 MED ORDER — DM-GUAIFENESIN ER 30-600 MG PO TB12
1.0000 | ORAL_TABLET | Freq: Two times a day (BID) | ORAL | Status: DC
  Administered 2023-05-02 – 2023-05-05 (×7): 1 via ORAL
  Filled 2023-05-02 (×7): qty 1

## 2023-05-02 MED ORDER — ACETAMINOPHEN 325 MG PO TABS
650.0000 mg | ORAL_TABLET | Freq: Four times a day (QID) | ORAL | Status: DC | PRN
  Administered 2023-05-03: 650 mg via ORAL

## 2023-05-02 MED ORDER — DOXYCYCLINE HYCLATE 100 MG PO TABS
100.0000 mg | ORAL_TABLET | Freq: Once | ORAL | Status: AC
  Administered 2023-05-02: 100 mg via ORAL
  Filled 2023-05-02: qty 1

## 2023-05-02 MED ORDER — SODIUM CHLORIDE 0.9 % IV SOLN
1.0000 g | Freq: Once | INTRAVENOUS | Status: AC
  Administered 2023-05-02: 1 g via INTRAVENOUS
  Filled 2023-05-02: qty 10

## 2023-05-02 MED ORDER — LEVETIRACETAM 500 MG PO TABS
1000.0000 mg | ORAL_TABLET | ORAL | Status: DC
  Filled 2023-05-02: qty 2

## 2023-05-02 MED ORDER — CARVEDILOL 12.5 MG PO TABS
12.5000 mg | ORAL_TABLET | Freq: Two times a day (BID) | ORAL | Status: DC
  Administered 2023-05-02 – 2023-05-05 (×5): 12.5 mg via ORAL
  Filled 2023-05-02: qty 4
  Filled 2023-05-02 (×5): qty 1

## 2023-05-02 NOTE — Assessment & Plan Note (Signed)
-  Secondary to multilobar pneumonia -Patient requiring 4 L nasal cannula supplementation at time of admission -Provide treatment with antibiotics, bronchodilator management and mucolytic's. -Follow clinical response and wean off oxygen supplementation as tolerated.

## 2023-05-02 NOTE — Assessment & Plan Note (Signed)
-  Resume home antihypertensive agents -Dialysis treatment will also help controlling blood pressure -Heart healthy/low-sodium diet discussed with patient

## 2023-05-02 NOTE — H&P (Signed)
History and Physical    Patient: [name] NWG:[id number] DOB: [birth year] DOA: 05/02/2023 DOS: the patient was seen and examined on 05/02/2023 PCP: Richarda Osmond, FNP  Patient coming from: Home  Chief Complaint:  Chief Complaint  Patient presents with   Weakness   HPI: [name] is a 57 y.o. female with medical history significant of paraplegia, suprapubic catheter, end-stage renal disease, hypertension, seizure disorder, and gastroesophageal reflux disease; who presented to the hospital secondary to generalized weakness and coughing spells.  Patient reported no sick contacts.  They have been complains of subjective fever and on the day of admission was found to be significantly hypoxic (in the low low 80s at time of EMS arrival). Patient reported no chest pain, no nausea, no vomiting, no dysuria, no hematuria, no changes in mentation or any other complaints.  Workup demonstrating multilobar pneumonia as lung with acute respiratory failure with hypoxia.  Cultures taken and antibiotics started.  TRH contacted to place patient in the hospital for further evaluation and management.  Review of Systems: As mentioned in the history of present illness. All other systems reviewed and are negative. Past Medical History:  Diagnosis Date   Abnormal uterine bleeding (AUB) 06/15/2014   Cancer (HCC)    uterine   High blood pressure    Paraplegia (lower)    Renal disorder    Seizure disorder (HCC)    Seizures (HCC)    Suprapubic catheter (HCC)    Urinary tract infection    Past Surgical History:  Procedure Laterality Date   APPLICATION OF WOUND VAC Right 09/13/2021   (approximately 1-65mos ago) pressure sore on right hip   BACK SURGERY     Pt stated "before 2000"   BIOPSY  12/03/2022   Procedure: BIOPSY;  Surgeon: Dolores Frame, MD;  Location: [facility];  Service: Gastroenterology;;   ESOPHAGOGASTRODUODENOSCOPY N/A 09/20/2015   Procedure:  ESOPHAGOGASTRODUODENOSCOPY (EGD);  Surgeon: Malissa Hippo, MD;  Location: [facility];  Service: Endoscopy;  Laterality: N/A;  730   ESOPHAGOGASTRODUODENOSCOPY (EGD) WITH PROPOFOL N/A 12/03/2022   Procedure: ESOPHAGOGASTRODUODENOSCOPY (EGD) WITH PROPOFOL;  Surgeon: Dolores Frame, MD;  Location: [facility];  Service: Gastroenterology;  Laterality: N/A;  1:15 pm, asa 3, pt knows to arrive at 10:30  dialysis pt, M,W & F   IR CATHETER TUBE CHANGE  04/02/2018   PERCUTANEOUS ENDOSCOPIC GASTROSTOMY (PEG) REMOVAL N/A 09/20/2015   Procedure: PERCUTANEOUS ENDOSCOPIC GASTROSTOMY (PEG) REMOVAL;  Surgeon: Malissa Hippo, MD;  Location: [facility];  Service: Endoscopy;  Laterality: N/A;   TEE WITHOUT CARDIOVERSION N/A 11/11/2022   Procedure: TRANSESOPHAGEAL ECHOCARDIOGRAM (TEE);  Surgeon: Pricilla Riffle, MD;  Location: [facility];  Service: Cardiovascular;  Laterality: N/A;   Social History:  reports that she has never smoked. She has never used smokeless tobacco. She reports current alcohol use. She reports that she does not use drugs.  Allergies  Allergen Reactions   Benadryl [Diphenhydramine Hcl (Sleep)] Hives   Daptomycin Hives   Linezolid Other (See Comments)    Patient self-discontinued treatment due to GI intolerance. Taking it along with moxifloxacin   Moxifloxacin Other (See Comments)    Patient self-discontinued treatment due to GI intolerance. Taking it along with linezolid   Quinine Derivatives Other (See Comments)    Alters mental status   Vancomycin Other (See Comments)    Pt is tolerating this medication at HD   Azithromycin Itching and Rash   Tetracycline Itching    Able to tolerate  Doxycycline.    Zosyn [Piperacillin Sod-Tazobactam So] Rash    Family History  Problem Relation Age of Onset   Cancer Mother    Hypertension Mother    Cancer Sister        breast and then spread everywhere.   Diabetes Paternal Grandmother    Hypertension Paternal Grandmother      Prior to Admission medications   Medication Sig Start Date End Date Taking? Authorizing Provider  acetaminophen (TYLENOL) 500 MG tablet Take 1,000 mg by mouth every 6 (six) hours as needed for moderate pain.   Yes [provider]  albuterol (PROVENTIL) (2.5 MG/3ML) 0.083% nebulizer solution Take 3 mLs (2.5 mg total) by nebulization every 4 (four) hours as needed for wheezing or shortness of breath. 04/15/23 04/14/24 Yes Emokpae, Courage, MD  amLODipine (NORVASC) 5 MG tablet Take 1 tablet (5 mg total) by mouth daily. Take 1 tablet on Monday,Wednesday and Friday then take 2 tablets on Tuesday,Thursday,Saturday and Sunday. 04/15/23  Yes Shon Hale, MD  carvedilol (COREG) 12.5 MG tablet Take 1 tablet (12.5 mg total) by mouth 2 (two) times daily with a meal. 04/15/23 08/03/23 Yes Emokpae, Courage, MD  cloNIDine (CATAPRES) 0.1 MG tablet Take 1 tablet (0.1 mg total) by mouth 2 (two) times daily. 04/15/23  Yes Emokpae, Courage, MD  cyclobenzaprine (FLEXERIL) 5 MG tablet Take 5 mg by mouth 3 (three) times daily as needed for muscle spasms. 04/11/23  Yes [provider]  furosemide (LASIX) 20 MG tablet Take 20 mg by mouth daily. 04/13/23  Yes [provider]  ibuprofen (ADVIL) 100 MG tablet Take 100 mg by mouth every 6 (six) hours as needed for fever or pain.   Yes [provider]  irbesartan (AVAPRO) 150 MG tablet Take 1 tablet (150 mg total) by mouth daily at 6 PM. 04/15/23  Yes Emokpae, Courage, MD  levETIRAcetam (KEPPRA) 500 MG tablet Take 2 tablets (1,000 mg total) by mouth every evening. Take an additional tablet on Monday,Wednesday and Friday evening after Dialysis 04/15/23  Yes Emokpae, Courage, MD  lidocaine-prilocaine (EMLA) cream Apply 1 Application topically 3 (three) times a week. 06/04/22  Yes [provider]  naloxone (NARCAN) nasal spray 4 mg/0.1 mL Place 0.4 mg into the nose once. 10/03/22  Yes [provider]  pantoprazole (PROTONIX)  40 MG tablet Take 40 mg by mouth daily. 08/03/22  Yes [provider]  senna-docusate (SENOKOT-S) 8.6-50 MG tablet Take 2 tablets by mouth at bedtime. 11/12/22  Yes Emokpae, Courage, MD  sevelamer carbonate (RENVELA) 800 MG tablet Take 800 mg by mouth 3 (three) times daily. 12/11/22  Yes [provider]  Nebulizers (COMPRESSOR/NEBULIZER) MISC 1 Units by Does not apply route as needed. 04/15/23   Shon Hale, MD    Physical Exam: Vitals:   05/02/23 1215 05/02/23 1343 05/02/23 1359 05/02/23 1705  BP: (!) 185/97 (!) 192/104 (!) 176/92 (!) 180/92  Pulse: 78 88  77  Resp: 17 19  20   Temp:      TempSrc:      SpO2: 95% 94%  100%  Weight:      Height:       General exam: Alert, awake, oriented x 3; in no major distress.  Good saturation on 4 L supplementation. Respiratory system: Decreased breath sounds at the bases; no using accessory muscle.  Positive rhonchi appreciated bilaterally. Cardiovascular system:RRR. No rubs or gallops; no JVD. Gastrointestinal system: Abdomen is nondistended, soft and nontender. No organomegaly or masses felt. Normal bowel sounds  heard. Central nervous system: No new focal neurological deficits. Extremities: No C/C/E, +pedal pulses Skin: No petechiae.  Suprapubic catheter in place. Psychiatry: Judgement and insight appear normal.  Flat affect appreciated.  Data Reviewed: Respiratory viral panel: Negative COVID, influenza and RSV CBC: WBCs 5.9, hemoglobin 10.8 and platelet count 180K Comprehensive metabolic panel: Sodium 135, potassium 4.5, chloride 97, bicarb 25, BUN 56, creatinine 5.07, normal LFTs and GFR 9 Magnesium: 2.2  Assessment and Plan: Seizure disorder (HCC) -Continue treatment with Keppra.  Essential hypertension -Resume home antihypertensive agents -Dialysis treatment will also help controlling blood pressure -Heart healthy/low-sodium diet discussed with patient  PNA (pneumonia) -Multilobar pneumonia appreciated on chest  images -Follow culture results -Checking also strep and Legionella antigen -Continue IV antibiotics, bronchodilator management and mucolytic's -Patient encouraged to use flutter valve. -Continue oxygen supplementation and wean off as tolerated.  GERD (gastroesophageal reflux disease) -Continue PPI.  ESRD (end stage renal disease) [facility]) -Nephrology service has been consulted to assist with hemodialysis continuation while inpatient -Continue treatment with Renvela. -Electrolytes overall stable at the moment.  Acute respiratory failure with hypoxia (HCC) -Secondary to multilobar pneumonia -Patient requiring 4 L nasal cannula supplementation at time of admission -Provide treatment with antibiotics, bronchodilator management and mucolytic's. -Follow clinical response and wean off oxygen supplementation as tolerated.   Anemia of chronic kidney disease -Hemoglobin appears to be stable -Continue to follow trend -No overt bleeding appreciated -Iron and Epogen therapy as per nephrology discretion.   Advance Care Planning:   Code Status: Full Code   Consults: Nephrology service  Family Communication: No family at bedside.  Severity of Illness: The appropriate patient status for this patient is INPATIENT. Inpatient status is judged to be reasonable and necessary in order to provide the required intensity of service to ensure the patient's safety. The patient's presenting symptoms, physical exam findings, and initial radiographic and laboratory data in the context of their chronic comorbidities is felt to place them at high risk for further clinical deterioration. Furthermore, it is not anticipated that the patient will be medically stable for discharge from the hospital within 2 midnights of admission.   * I certify that at the point of admission it is my clinical judgment that the patient will require inpatient hospital care spanning beyond 2 midnights from the point of admission due to high  intensity of service, high risk for further deterioration and high frequency of surveillance required.*  Author: Vassie Loll, MD 05/02/2023 5:28 PM  For on call review [email or web address].

## 2023-05-02 NOTE — Assessment & Plan Note (Signed)
Continue PPI ?

## 2023-05-02 NOTE — Assessment & Plan Note (Signed)
-  Continue treatment with Keppra.

## 2023-05-02 NOTE — Assessment & Plan Note (Addendum)
-  Multilobar pneumonia appreciated on chest images -Follow culture results -Checking also strep and Legionella antigen -Continue IV antibiotics, bronchodilator management and mucolytic's -Patient encouraged to use flutter valve. -Continue oxygen supplementation and wean off as tolerated.

## 2023-05-02 NOTE — ED Provider Notes (Signed)
 [place] EMERGENCY DEPARTMENT AT [facility] Provider Note   CSN: [id number] Arrival date & time: 05/02/23  4098     History Chief Complaint  Patient presents with   Weakness    [name] is a 57 y.o. female.  Patient past history significant for paraplegia, TBI, end-stage renal disease on hemodialysis presents to the emergency department concerns of weakness.  Reports that she has been having some increasing weakness over the last several days but denies any fever, chills, body aches.  States that she was told that she had some fluid on her lungs recently which was to come to the emergency department or outpatient imaging for follow-up x-ray last week which she never obtained.  Denies any significant shortness of breath or chest pain.  No specific urinary discomforts patient does still make urine.  She has a catheter in place.  EMS reports the patient was initially hypoxic when they arrived satting in the mid 80% and was placed on 4 L nasal cannula which is improved her oxygenation.  Patient is not on any oxygen at baseline.   Weakness      Home Medications Prior to Admission medications   Medication Sig Start Date End Date Taking? Authorizing Provider  acetaminophen (TYLENOL) 500 MG tablet Take 1,000 mg by mouth every 6 (six) hours as needed for moderate pain.   Yes [provider]  albuterol (PROVENTIL) (2.5 MG/3ML) 0.083% nebulizer solution Take 3 mLs (2.5 mg total) by nebulization every 4 (four) hours as needed for wheezing or shortness of breath. 04/15/23 04/14/24 Yes Emokpae, Courage, MD  amLODipine (NORVASC) 5 MG tablet Take 1 tablet (5 mg total) by mouth daily. Take 1 tablet on Monday,Wednesday and Friday then take 2 tablets on Tuesday,Thursday,Saturday and Sunday. 04/15/23  Yes Shon Hale, MD  carvedilol (COREG) 12.5 MG tablet Take 1 tablet (12.5 mg total) by mouth 2 (two) times daily with a meal. 04/15/23 08/03/23 Yes Emokpae, Courage, MD   cloNIDine (CATAPRES) 0.1 MG tablet Take 1 tablet (0.1 mg total) by mouth 2 (two) times daily. 04/15/23  Yes Emokpae, Courage, MD  cyclobenzaprine (FLEXERIL) 5 MG tablet Take 5 mg by mouth 3 (three) times daily as needed for muscle spasms. 04/11/23  Yes [provider]  furosemide (LASIX) 20 MG tablet Take 20 mg by mouth daily. 04/13/23  Yes [provider]  ibuprofen (ADVIL) 100 MG tablet Take 100 mg by mouth every 6 (six) hours as needed for fever or pain.   Yes [provider]  irbesartan (AVAPRO) 150 MG tablet Take 1 tablet (150 mg total) by mouth daily at 6 PM. 04/15/23  Yes Emokpae, Courage, MD  levETIRAcetam (KEPPRA) 500 MG tablet Take 2 tablets (1,000 mg total) by mouth every evening. Take an additional tablet on Monday,Wednesday and Friday evening after Dialysis 04/15/23  Yes Emokpae, Courage, MD  lidocaine-prilocaine (EMLA) cream Apply 1 Application topically 3 (three) times a week. 06/04/22  Yes [provider]  naloxone (NARCAN) nasal spray 4 mg/0.1 mL Place 0.4 mg into the nose once. 10/03/22  Yes [provider]  pantoprazole (PROTONIX) 40 MG tablet Take 40 mg by mouth daily. 08/03/22  Yes [provider]  senna-docusate (SENOKOT-S) 8.6-50 MG tablet Take 2 tablets by mouth at bedtime. 11/12/22  Yes Emokpae, Courage, MD  sevelamer carbonate (RENVELA) 800 MG tablet Take 800 mg by mouth 3 (three) times daily. 12/11/22  Yes [provider]  Nebulizers (COMPRESSOR/NEBULIZER) MISC 1 Units by Does not apply route as  needed. 04/15/23   Shon Hale, MD      Allergies    Benadryl [diphenhydramine hcl (sleep)], Daptomycin, Linezolid, Moxifloxacin, Quinine derivatives, Vancomycin, Azithromycin, Tetracycline, and Zosyn [piperacillin sod-tazobactam so]    Review of Systems   Review of Systems  Neurological:  Positive for weakness.  All other systems reviewed and are negative.   Physical Exam Updated Vital Signs BP (!) 180/92  Comment: told rn bp up  Pulse 77   Temp 98.1 F (36.7 C) (Oral)   Resp 20   Ht 5\' 2"  (1.575 m)   Wt 51.7 kg   LMP  (LMP Unknown)   SpO2 100%   BMI 20.85 kg/m  Physical Exam Vitals and nursing note reviewed.  Constitutional:      General: She is not in acute distress.    Appearance: She is well-developed.  HENT:     Head: Normocephalic and atraumatic.  Eyes:     Conjunctiva/sclera: Conjunctivae normal.  Cardiovascular:     Rate and Rhythm: Normal rate and regular rhythm.     Heart sounds: No murmur heard. Pulmonary:     Effort: Pulmonary effort is normal. No respiratory distress.     Breath sounds: Rales present.     Comments: Rales noted to right lower lung field Abdominal:     Palpations: Abdomen is soft.     Tenderness: There is no abdominal tenderness.  Musculoskeletal:        General: No swelling.     Cervical back: Neck supple.  Skin:    General: Skin is warm and dry.     Capillary Refill: Capillary refill takes less than 2 seconds.  Neurological:     Mental Status: She is alert.  Psychiatric:        Mood and Affect: Mood normal.     ED Results / Procedures / Treatments   Labs (all labs ordered are listed, but only abnormal results are displayed) Labs Reviewed  CBC WITH DIFFERENTIAL/PLATELET - Abnormal; Notable for the following components:      Result Value   RBC 3.75 (*)    Hemoglobin 10.8 (*)    MCHC 29.5 (*)    RDW 19.7 (*)    All other components within normal limits  COMPREHENSIVE METABOLIC PANEL - Abnormal; Notable for the following components:   Chloride 97 (*)    Glucose, Bld 105 (*)    BUN 56 (*)    Creatinine, Ser 5.07 (*)    Albumin 2.9 (*)    AST 14 (*)    GFR, Estimated 9 (*)    All other components within normal limits  URINALYSIS, ROUTINE W REFLEX MICROSCOPIC - Abnormal; Notable for the following components:   APPearance HAZY (*)    Glucose, UA >=500 (*)    Hgb urine dipstick SMALL (*)    Protein, ur >=300 (*)    Leukocytes,Ua  LARGE (*)    Bacteria, UA MANY (*)    All other components within normal limits  PHOSPHORUS - Abnormal; Notable for the following components:   Phosphorus 5.3 (*)    All other components within normal limits  RESP PANEL BY RT-PCR (RSV, FLU A&B, COVID)  RVPGX2  CULTURE, BLOOD (ROUTINE X 2)  CULTURE, BLOOD (ROUTINE X 2)  MAGNESIUM  HIV ANTIBODY (ROUTINE TESTING W REFLEX)  LEGIONELLA PNEUMOPHILA SEROGP 1 UR AG  STREP PNEUMONIAE URINARY ANTIGEN  HEPATITIS B SURFACE ANTIGEN  HEPATITIS B SURFACE ANTIBODY, QUANTITATIVE  CBC  BASIC METABOLIC PANEL  CBG MONITORING, ED  EKG None  Radiology DG Chest 2 View  Result Date: 05/02/2023 CLINICAL DATA:  Weakness. EXAM: CHEST - 2 VIEW COMPARISON:  04/15/2023.  Chest CTA dated 03/05/2023 FINDINGS: Improved inspiration. Grossly stable enlarged cardiac silhouette. Dense patchy opacity in both lower lobes with additional patchy airspace opacity in the right middle lobe posterior upper lobe region on the lateral view. Increased right pleural fluid. Thoracic spine fixation hardware. IMPRESSION: 1. Bilateral multi lobar pneumonia. 2. Increased right pleural fluid. 3. Stable cardiomegaly. Electronically Signed   By: Beckie Salts M.D.   On: 05/02/2023 11:11    Procedures Procedures   Medications Ordered in ED Medications  heparin injection 5,000 Units (5,000 Units Subcutaneous Given 05/02/23 1422)  albuterol (PROVENTIL) (2.5 MG/3ML) 0.083% nebulizer solution 2.5 mg (has no administration in time range)  cloNIDine (CATAPRES) tablet 0.1 mg (0.1 mg Oral Given 05/02/23 1225)  carvedilol (COREG) tablet 12.5 mg (0 mg Oral Duplicate 05/02/23 1700)  dextromethorphan-guaiFENesin (MUCINEX DM) 30-600 MG per 12 hr tablet 1 tablet (1 tablet Oral Given 05/02/23 1225)  levETIRAcetam (KEPPRA) tablet 1,000 mg (1,000 mg Oral Given 05/02/23 1422)  sevelamer carbonate (RENVELA) tablet 800 mg (800 mg Oral Given 05/02/23 1735)  pantoprazole (PROTONIX) EC tablet 40 mg (40 mg Oral  Given 05/02/23 1225)  senna-docusate (Senokot-S) tablet 2 tablet (has no administration in time range)  sodium chloride flush (NS) 0.9 % injection 3 mL (has no administration in time range)  sodium chloride flush (NS) 0.9 % injection 3 mL (has no administration in time range)  0.9 %  sodium chloride infusion (has no administration in time range)  acetaminophen (TYLENOL) tablet 650 mg (has no administration in time range)    Or  acetaminophen (TYLENOL) suppository 650 mg (has no administration in time range)  ondansetron (ZOFRAN) tablet 4 mg (has no administration in time range)    Or  ondansetron (ZOFRAN) injection 4 mg (has no administration in time range)  budesonide (PULMICORT) nebulizer solution 0.5 mg (0.5 mg Nebulization Given 05/02/23 1406)  levETIRAcetam (KEPPRA) tablet 1,000 mg (has no administration in time range)  hydrALAZINE (APRESOLINE) injection 10 mg (has no administration in time range)  cefTRIAXone (ROCEPHIN) 2 g in sodium chloride 0.9 % 100 mL IVPB (has no administration in time range)  doxycycline (VIBRA-TABS) tablet 100 mg (has no administration in time range)  Chlorhexidine Gluconate Cloth 2 % PADS 6 each (has no administration in time range)  cefTRIAXone (ROCEPHIN) 1 g in sodium chloride 0.9 % 100 mL IVPB (1 g Intravenous New Bag/Given 05/02/23 1236)  doxycycline (VIBRA-TABS) tablet 100 mg (100 mg Oral Given 05/02/23 1225)    ED Course/ Medical Decision Making/ A&P                               Medical Decision Making Amount and/or Complexity of Data Reviewed Labs: ordered. Radiology: ordered.  Risk Prescription drug management. Decision regarding hospitalization.   This patient presents to the ED for concern of weakness.  Differential diagnosis includes pneumonia, sepsis, UTI, ESRD on hemodialysis   Lab Tests:  I Ordered, and personally interpreted labs.  The pertinent results include: CBC is unremarkable, CMP with minimal renal impairment, urinalysis with  signs of possible infection although patient denies any current symptoms, respiratory panel negative for COVID-19, influenza and RSV, blood cultures collected and pending   Imaging Studies ordered:  I ordered imaging studies including chest x-ray I independently visualized and interpreted imaging which showed multilobar  pneumonia I agree with the radiologist interpretation   Medicines ordered and prescription drug management:  I ordered medication including Rocephin, doxycycline for pneumonia Reevaluation of the patient after these medicines showed that the patient stayed the same I have reviewed the patients home medicines and have made adjustments as needed   Problem List / ED Course:  Patient presents to the emergency department concerns of weakness.  Past history significant for paraplegia, TBI, end-stage renal disease on hemodialysis.  She reports that she has been having some worsening weakness over the last several days.  Was reportedly evaluated by home health nurse today who reported the patient was having some notable hypertension and had some period of hypoxia when she was rolled over.  Patient states that she receives dialysis 3 days a week and missed her session earlier today due to feeling sick.  Denies any sick contacts as far as patient is aware but no other acute symptoms at this time.  Denies any urinary symptoms, abdominal pain, nausea or vomiting.  Workup initiated with basic labs for assessment of possible symptoms. Lab workup was remarkable for clear renal dysfunction with GFR at 9 and creatinine elevated to 5.07.  Urinalysis has findings of possible UTI although patient reports no acute symptoms at this time.  Respiratory panel is negative.  Chest x-ray is concerning however for multilobar pneumonia.  Does not appear this is hospital-acquired as patient not been hospitalized recently in the last 48 hours or so.  Was recently admitted for pneumonia several weeks ago.   Suspect this is likely worsening pneumonia she does report that she had improvement and resolution of symptoms and this is a new onset of symptoms.  Given this workup, will initiate antibiotic therapy including Rocephin and doxycycline.  Will consult hospitalist for admission. Spoke with Dr. Gwenlyn Perking, hospitalist, who will be admitting patient for pneumonia with new oxygen demand.   Final Clinical Impression(s) / ED Diagnoses Final diagnoses:  Acute respiratory failure with hypoxia (HCC)  Pneumonia of both lungs due to infectious organism, unspecified part of lung    Rx / DC Orders ED Discharge Orders     None         Smitty Knudsen, PA-C 05/02/23 1847    Rondel Baton, MD 05/03/23 [phone]

## 2023-05-02 NOTE — ED Triage Notes (Signed)
Pt bib [facility] from home. Pt has a home nurse, home nurse reported to EMS that pt had a high BP last night and when she rolled pt her O2 dropped. Pt states she is a dialysis pt and has had dialysis all week except today. Pt states she just feels weak and tired the past few days.

## 2023-05-02 NOTE — ED Notes (Signed)
Accepting RN unable to accept report at this time\

## 2023-05-02 NOTE — Plan of Care (Signed)
°  Problem: Education: °Goal: Knowledge of General Education information will improve °Description: Including pain rating scale, medication(s)/side effects and non-pharmacologic comfort measures °Outcome: Progressing °  °Problem: Nutrition: °Goal: Adequate nutrition will be maintained °Outcome: Progressing °  °Problem: Skin Integrity: °Goal: Risk for impaired skin integrity will decrease °Outcome: Progressing °  °

## 2023-05-02 NOTE — Assessment & Plan Note (Signed)
-  Nephrology service has been consulted to assist with hemodialysis continuation while inpatient -Continue treatment with Renvela. -Electrolytes overall stable at the moment.

## 2023-05-02 NOTE — Progress Notes (Addendum)
Nephrology note: 57 year old female ESRD on HD who was recently hospitalized for acute respiratory failure and pneumonia presented with hypoxia.  She is afebrile, blood pressure elevated, using supplemental oxygen.  Labs showed potassium level of 4.5, WBC 5.9 and hemoglobin 10.8.  Chest x-ray showed bilateral multilobar pneumonia and cardiomegaly with pleural effusion.  Currently admitted for pneumonia and being receiving doxycycline and ceftriaxone.  Outpatient HD orders from recent hospitalization: [facility].  MWF.  3 hours.  EDW 44.5 kg.  LUE AVF.  Flow rates: 400/500.  3K bath.  Heparin: No.  Meds: Calcitriol 0.75 mcg with treatment, Mircera 175 mcg (due 11/18), Venofer 50 mg (due 11/18) -HD tomorrow per MWF schedule  Plan: We will plan for dialysis tomorrow.  Discussed with the HD nurse.  No urgent need for HD today.  Discussed with the primary team as well.  Crista Elliot, [employer].

## 2023-05-03 DIAGNOSIS — J9601 Acute respiratory failure with hypoxia: Secondary | ICD-10-CM | POA: Diagnosis not present

## 2023-05-03 LAB — CBC
HCT: 35.6 % — ABNORMAL LOW (ref 36.0–46.0)
Hemoglobin: 10.2 g/dL — ABNORMAL LOW (ref 12.0–15.0)
MCH: 27.9 pg (ref 26.0–34.0)
MCHC: 28.7 g/dL — ABNORMAL LOW (ref 30.0–36.0)
MCV: 97.3 fL (ref 80.0–100.0)
Platelets: 189 10*3/uL (ref 150–400)
RBC: 3.66 MIL/uL — ABNORMAL LOW (ref 3.87–5.11)
RDW: 19.4 % — ABNORMAL HIGH (ref 11.5–15.5)
WBC: 5.4 10*3/uL (ref 4.0–10.5)
nRBC: 0 % (ref 0.0–0.2)

## 2023-05-03 LAB — BASIC METABOLIC PANEL
Anion gap: 15 (ref 5–15)
BUN: 66 mg/dL — ABNORMAL HIGH (ref 6–20)
CO2: 21 mmol/L — ABNORMAL LOW (ref 22–32)
Calcium: 8.7 mg/dL — ABNORMAL LOW (ref 8.9–10.3)
Chloride: 97 mmol/L — ABNORMAL LOW (ref 98–111)
Creatinine, Ser: 5.65 mg/dL — ABNORMAL HIGH (ref 0.44–1.00)
GFR, Estimated: 8 mL/min — ABNORMAL LOW (ref >60)
Glucose, Bld: 82 mg/dL (ref 70–99)
Potassium: 4.8 mmol/L (ref 3.5–5.1)
Sodium: 133 mmol/L — ABNORMAL LOW (ref 135–145)

## 2023-05-03 LAB — HEPATITIS B SURFACE ANTIBODY, QUANTITATIVE: Hep B S AB Quant (Post): 297 m[IU]/mL

## 2023-05-03 LAB — MRSA NEXT GEN BY PCR, NASAL: MRSA by PCR Next Gen: NOT DETECTED

## 2023-05-03 MED ORDER — LIDOCAINE HCL (PF) 1 % IJ SOLN: 5.0000 mL | INTRAMUSCULAR | Status: DC | PRN

## 2023-05-03 MED ORDER — IPRATROPIUM-ALBUTEROL 0.5-2.5 (3) MG/3ML IN SOLN
3.0000 mL | Freq: Three times a day (TID) | RESPIRATORY_TRACT | Status: DC
  Administered 2023-05-03 – 2023-05-05 (×4): 3 mL via RESPIRATORY_TRACT
  Filled 2023-05-03 (×6): qty 3

## 2023-05-03 MED ORDER — IPRATROPIUM-ALBUTEROL 0.5-2.5 (3) MG/3ML IN SOLN
3.0000 mL | Freq: Three times a day (TID) | RESPIRATORY_TRACT | Status: DC
  Administered 2023-05-03: 3 mL via RESPIRATORY_TRACT
  Filled 2023-05-03: qty 3

## 2023-05-03 MED ORDER — ORAL CARE MOUTH RINSE: 15.0000 mL | OROMUCOSAL | Status: DC | PRN

## 2023-05-03 MED ORDER — PENTAFLUOROPROP-TETRAFLUOROETH EX AERO: 1.0000 | INHALATION_SPRAY | CUTANEOUS | Status: DC | PRN

## 2023-05-03 MED ORDER — ACETAMINOPHEN 325 MG PO TABS
ORAL_TABLET | ORAL | Status: AC
  Filled 2023-05-03: qty 2

## 2023-05-03 MED ORDER — AMLODIPINE BESYLATE 5 MG PO TABS
5.0000 mg | ORAL_TABLET | Freq: Every day | ORAL | Status: DC
  Administered 2023-05-03 – 2023-05-05 (×3): 5 mg via ORAL
  Filled 2023-05-03 (×3): qty 1

## 2023-05-03 MED ORDER — LIDOCAINE-PRILOCAINE 2.5-2.5 % EX CREA: 1.0000 | TOPICAL_CREAM | CUTANEOUS | Status: DC | PRN

## 2023-05-03 MED ORDER — LEVETIRACETAM 500 MG PO TABS
1000.0000 mg | ORAL_TABLET | Freq: Once | ORAL | Status: AC
  Administered 2023-05-03: 1000 mg via ORAL
  Filled 2023-05-03: qty 2

## 2023-05-03 NOTE — Progress Notes (Signed)
   HEMODIALYSIS TREATMENT NOTE:  HD completed using left upper arm AVF (15g/antegrade). Persistent HTN despite goal challenge, but BP not high enough to warrant administration of prn Hydralazine.  Acetaminophen 650mg  given for c/o HA.  Pt asked to end session 80m early d/t cramping.  Goal met: 3 liters removed. All blood was returned.  Hand-off given to Lacretia Leigh RN, who will administer oral anti-hypertensives now due.     05/03/23 1705  Vitals  Temp 97.9 F (36.6 C)  Temp Source Oral  BP (!) 190/84  MAP (mmHg) 102  BP Location Right Arm  BP Method Automatic  Patient Position (if appropriate) Sitting  Pulse Rate 83  Pulse Rate Source Monitor  Resp 18  Oxygen Therapy  SpO2 100 %  O2 Device Nasal Cannula  O2 Flow Rate (L/min) 2 L/min  Post Treatment  Dialyzer Clearance Lightly streaked  Hemodialysis Intake (mL) 0 mL  Liters Processed 56.6  Fluid Removed (mL) 3000 mL  Tolerated HD Treatment No (Comment)  Post-Hemodialysis Comments Goal met, but pt signed off early / AMA (d/t cramping) with 13m remaining.  AVG/AVF Arterial Site Held (minutes) 5 minutes  AVG/AVF Venous Site Held (minutes) 5 minutes  Fistula / Graft Left Upper arm Arteriovenous fistula  No placement date or time found.   Placed prior to admission: Yes  Orientation: Left  Access Location: Upper arm  Access Type: Arteriovenous fistula  Fistula / Graft Assessment Bruit;Thrill  Status Patent    Arman Filter, RN AP [facility]

## 2023-05-03 NOTE — Progress Notes (Signed)
PROGRESS NOTE    [name]  QIO:[id number] DOB: 1966-05-23 DOA: 05/02/2023 PCP: Richarda Osmond, FNP   Brief Narrative: 57 year old with past medical history significant for suprapubic catheter, end-stage renal disease, hypertension, seizure disorder, GERD, presents complaining of weakness, worsening shortness of breath, subjective fever.  She was found to be hypoxic during EMS evaluation.  Oxygen sat in the low 80.  Evaluation in the ED consistent with multilobar pneumonia.  Admitted for further care   Assessment & Plan:   Principal Problem:   Acute respiratory failure with hypoxia (HCC) Active Problems:   PNA (pneumonia)   ESRD (end stage renal disease) (HCC)   Seizure disorder (HCC)   Essential hypertension   GERD (gastroesophageal reflux disease)   1-Acute hypoxic respiratory failure, in the setting of multilobar pneumonia, component of acute pulmonary edema in the setting of ESRD -Patient presented with worsening cough, weakness, shortness of breath.  Chest x-ray showed bilateral multilobar pneumonia, increased right pleural fluid. -Currently on 2 L of oxygen appears stable. -Continue with IV ceftriaxone and oral doxycycline -Nephrology  has been consulted for hemodialysis -Legionella pending and strep: Negative -MRSA PCR negative. -Follow blood cultures -She will need follow-up chest x-ray to follow-up on pleural effusion -Continue duo-neb.   2-Essential hypertension, uncontrolled Continue with carvedilol, clonidine, as needed hydralazine Resume Norvasc.   3-Seizure disorder: Continue with Keppra  4-GERD: Continue with PPI  5-ESRD: Nephrology has been consulted for hemodialysis.  Plan for dialysis today.   Anemia of chronic kidney disease: Iron and Epogen per nephrology.  Continue to monitor hemoglobin Hb stable at 10.     Estimated body mass index is 20.85 kg/m as calculated from the following:   Height as of this encounter: 5\' 2"  (1.575  m).   Weight as of this encounter: 51.7 kg.   DVT prophylaxis: Heparin Code Status: Full code Family Communication: Care discussed with patient Disposition Plan:  Status is: Inpatient Remains inpatient appropriate because: Management of pneumonia    Consultants:  Nephrology  Procedures:  None  Antimicrobials:    Subjective: She is alert, feels weak. Report SOB and cough.    Objective: Vitals:   05/02/23 2033 05/03/23 0025 05/03/23 0159 05/03/23 0403  BP:  (!) 188/86 (!) 168/84 (!) 184/83  Pulse:  81 86 83  Resp:  20  20  Temp: 98.1 F (36.7 C) 97.7 F (36.5 C)  98.2 F (36.8 C)  TempSrc: Oral Oral  Oral  SpO2:  98% 98% 99%  Weight:      Height:        Intake/Output Summary (Last 24 hours) at 05/03/2023 1010 Last data filed at 05/03/2023 0545 Gross per 24 hour  Intake 240 ml  Output 150 ml  Net 90 ml   Filed Weights   05/02/23 0946  Weight: 51.7 kg    Examination:  General exam: Appears calm and comfortable  Respiratory system: BL ronchus.  Cardiovascular system: S1 & S2 heard, RRR.  Gastrointestinal system: Abdomen is nondistended, soft and nontender. No organomegaly or masses felt. Normal bowel sounds heard. Central nervous system: Alert and oriented.  Extremities: no edema  Data Reviewed: I have personally reviewed following labs and imaging studies  CBC: Recent Labs  Lab 05/02/23 1008 05/03/23 0416  WBC 5.9 5.4  NEUTROABS 4.3  --   HGB 10.8* 10.2*  HCT 36.6 35.6*  MCV 97.6 97.3  PLT 180 189   Basic Metabolic Panel: Recent Labs  Lab 05/02/23 1008 05/03/23 0416  NA 135 133*  K 4.5 4.8  CL 97* 97*  CO2 25 21*  GLUCOSE 105* 82  BUN 56* 66*  CREATININE 5.07* 5.65*  CALCIUM 9.1 8.7*  MG 2.2  --   PHOS 5.3*  --    GFR: Estimated Creatinine Clearance: 8.9 mL/min (A) (by C-G formula based on SCr of 5.65 mg/dL (H)). Liver Function Tests: Recent Labs  Lab 05/02/23 1008  AST 14*  ALT 12  ALKPHOS 98  BILITOT 0.5  PROT 7.8   ALBUMIN 2.9*   No results for input(s): "LIPASE", "AMYLASE" in the last 168 hours. No results for input(s): "AMMONIA" in the last 168 hours. Coagulation Profile: No results for input(s): "INR", "PROTIME" in the last 168 hours. Cardiac Enzymes: No results for input(s): "CKTOTAL", "CKMB", "CKMBINDEX", "TROPONINI" in the last 168 hours. BNP (last 3 results) No results for input(s): "PROBNP" in the last 8760 hours. HbA1C: No results for input(s): "HGBA1C" in the last 72 hours. CBG: Recent Labs  Lab 05/02/23 1010  GLUCAP 97   Lipid Profile: No results for input(s): "CHOL", "HDL", "LDLCALC", "TRIG", "CHOLHDL", "LDLDIRECT" in the last 72 hours. Thyroid Function Tests: No results for input(s): "TSH", "T4TOTAL", "FREET4", "T3FREE", "THYROIDAB" in the last 72 hours. Anemia Panel: No results for input(s): "VITAMINB12", "FOLATE", "FERRITIN", "TIBC", "IRON", "RETICCTPCT" in the last 72 hours. Sepsis Labs: No results for input(s): "PROCALCITON", "LATICACIDVEN" in the last 168 hours.  Recent Results (from the past 240 hour(s))  Resp panel by RT-PCR (RSV, Flu A&B, Covid) Anterior Nasal Swab     Status: None   Collection Time: 05/02/23 10:08 AM   Specimen: Anterior Nasal Swab  Result Value Ref Range Status   SARS Coronavirus 2 by RT PCR NEGATIVE NEGATIVE Final    Comment: (NOTE) SARS-CoV-2 target nucleic acids are NOT DETECTED.  The SARS-CoV-2 RNA is generally detectable in upper respiratory specimens during the acute phase of infection. The lowest concentration of SARS-CoV-2 viral copies this assay can detect is 138 copies/mL. A negative result does not preclude SARS-Cov-2 infection and should not be used as the sole basis for treatment or other patient management decisions. A negative result may occur with  improper specimen collection/handling, submission of specimen other than nasopharyngeal swab, presence of viral mutation(s) within the areas targeted by this assay, and inadequate  number of viral copies(<138 copies/mL). A negative result must be combined with clinical observations, patient history, and epidemiological information. The expected result is Negative.  Fact Sheet for Patients:  BloggerCourse.com  Fact Sheet for Healthcare Providers:  SeriousBroker.it  This test is no t yet approved or cleared by the [place] FDA and  has been authorized for detection and/or diagnosis of SARS-CoV-2 by FDA under an Emergency Use Authorization (EUA). This EUA will remain  in effect (meaning this test can be used) for the duration of the COVID-19 declaration under Section 564(b)(1) of the Act, 21 U.S.C.section 360bbb-3(b)(1), unless the authorization is terminated  or revoked sooner.       Influenza A by PCR NEGATIVE NEGATIVE Final   Influenza B by PCR NEGATIVE NEGATIVE Final    Comment: (NOTE) The Xpert Xpress SARS-CoV-2/FLU/RSV plus assay is intended as an aid in the diagnosis of influenza from Nasopharyngeal swab specimens and should not be used as a sole basis for treatment. Nasal washings and aspirates are unacceptable for Xpert Xpress SARS-CoV-2/FLU/RSV testing.  Fact Sheet for Patients: BloggerCourse.com  Fact Sheet for Healthcare Providers: SeriousBroker.it  This test is not yet approved or cleared by the [place] and  has been authorized for detection and/or diagnosis of SARS-CoV-2 by FDA under an Emergency Use Authorization (EUA). This EUA will remain in effect (meaning this test can be used) for the duration of the COVID-19 declaration under Section 564(b)(1) of the Act, 21 U.S.C. section 360bbb-3(b)(1), unless the authorization is terminated or revoked.     Resp Syncytial Virus by PCR NEGATIVE NEGATIVE Final    Comment: (NOTE) Fact Sheet for Patients: BloggerCourse.com  Fact Sheet for Healthcare  Providers: SeriousBroker.it  This test is not yet approved or cleared by the [place] FDA and has been authorized for detection and/or diagnosis of SARS-CoV-2 by FDA under an Emergency Use Authorization (EUA). This EUA will remain in effect (meaning this test can be used) for the duration of the COVID-19 declaration under Section 564(b)(1) of the Act, 21 U.S.C. section 360bbb-3(b)(1), unless the authorization is terminated or revoked.  Performed at [facility], [address] [place], [place] [address]   Blood culture (routine x 2)     Status: None (Preliminary result)   Collection Time: 05/02/23 12:33 PM   Specimen: BLOOD  Result Value Ref Range Status   Specimen Description BLOOD BLOOD RIGHT ARM  Final   Special Requests Normal  Final   Culture   Final    NO GROWTH < 24 HOURS Performed at [facility], [address], [place], [place] [address]    Report Status PENDING  Incomplete  Blood culture (routine x 2)     Status: None (Preliminary result)   Collection Time: 05/02/23 12:33 PM   Specimen: BLOOD  Result Value Ref Range Status   Specimen Description BLOOD BLOOD RIGHT HAND  Final   Special Requests Normal  Final   Culture   Final    NO GROWTH < 24 HOURS Performed at [facility], [address] [place], [place] [address]    Report Status PENDING  Incomplete  MRSA Next Gen by PCR, Nasal     Status: None   Collection Time: 05/03/23  1:54 AM   Specimen: Nasal Mucosa; Nasal Swab  Result Value Ref Range Status   MRSA by PCR Next Gen NOT DETECTED NOT DETECTED Final    Comment: (NOTE) The GeneXpert MRSA Assay (FDA approved for NASAL specimens only), is one component of a comprehensive MRSA colonization surveillance program. It is not intended to diagnose MRSA infection nor to guide or monitor treatment for MRSA infections. Test performance is not FDA approved in patients less than 76 years old. Performed at [facility], [address],  [place], [place] [address]          Radiology Studies: DG Chest 2 View  Result Date: 05/02/2023 CLINICAL DATA:  Weakness. EXAM: CHEST - 2 VIEW COMPARISON:  04/15/2023.  Chest CTA dated 03/05/2023 FINDINGS: Improved inspiration. Grossly stable enlarged cardiac silhouette. Dense patchy opacity in both lower lobes with additional patchy airspace opacity in the right middle lobe posterior upper lobe region on the lateral view. Increased right pleural fluid. Thoracic spine fixation hardware. IMPRESSION: 1. Bilateral multi lobar pneumonia. 2. Increased right pleural fluid. 3. Stable cardiomegaly. Electronically Signed   By: Beckie Salts M.D.   On: 05/02/2023 11:11        Scheduled Meds:  budesonide (PULMICORT) nebulizer solution  0.5 mg Nebulization BID   carvedilol  12.5 mg Oral BID WC   Chlorhexidine Gluconate Cloth  6 each Topical Q0600   cloNIDine  0.1 mg Oral BID   dextromethorphan-guaiFENesin  1 tablet Oral BID  doxycycline  100 mg Oral Q12H   heparin injection (subcutaneous)  5,000 Units Subcutaneous Q8H   levETIRAcetam  1,000 mg Oral Daily   levETIRAcetam  1,000 mg Oral Q M,W,F-HD   pantoprazole  40 mg Oral Daily   senna-docusate  2 tablet Oral QHS   sevelamer carbonate  800 mg Oral TID WC   sodium chloride flush  3 mL Intravenous Q12H   Continuous Infusions:  sodium chloride     cefTRIAXone (ROCEPHIN)  IV 2 g (05/03/23 0545)     LOS: 1 day    Time spent: 35 minutes    Berlinda Farve A Mixtli Reno, MD [facility]   If 7PM-7AM, please contact night-coverage [email or web address]  05/03/2023, 10:10 AM

## 2023-05-03 NOTE — Plan of Care (Signed)

## 2023-05-03 NOTE — Progress Notes (Signed)
   05/03/23 1510  TOC Brief Assessment  Insurance and Status Reviewed  Patient has primary care physician Yes  Home environment has been reviewed From home  Prior level of function: Needs assistance, has a home nurse.  Prior/Current Home Services Current home services (Skilled nursing)  Social Determinants of Health Reivew SDOH reviewed no interventions necessary  Readmission risk has been reviewed Yes (Red)  Transition of care needs transition of care needs identified, TOC will continue to follow   Pat high risk for readmission, TOC will assess pt prior to DC. TOC to follow.

## 2023-05-04 DIAGNOSIS — N186 End stage renal disease: Secondary | ICD-10-CM | POA: Diagnosis not present

## 2023-05-04 DIAGNOSIS — I10 Essential (primary) hypertension: Secondary | ICD-10-CM | POA: Diagnosis not present

## 2023-05-04 DIAGNOSIS — G40909 Epilepsy, unspecified, not intractable, without status epilepticus: Secondary | ICD-10-CM | POA: Diagnosis not present

## 2023-05-04 DIAGNOSIS — J9601 Acute respiratory failure with hypoxia: Secondary | ICD-10-CM | POA: Diagnosis not present

## 2023-05-04 MED ORDER — CHLORHEXIDINE GLUCONATE CLOTH 2 % EX PADS
6.0000 | MEDICATED_PAD | Freq: Every day | CUTANEOUS | Status: DC
Start: 2023-05-05 — End: 2023-05-05

## 2023-05-04 NOTE — Progress Notes (Signed)
PROGRESS NOTE    [name]  WUJ:[id number] DOB: May 11, 1966 DOA: 05/02/2023 PCP: Richarda Osmond, FNP   Brief Narrative: 57 year old with past medical history significant for suprapubic catheter, end-stage renal disease, hypertension, seizure disorder, GERD, presents complaining of weakness, worsening shortness of breath, subjective fever.  She was found to be hypoxic during EMS evaluation.  Oxygen sat in the low 80.  Evaluation in the ED consistent with multilobar pneumonia.  Admitted for further care   Assessment & Plan:   Principal Problem:   Acute respiratory failure with hypoxia (HCC) Active Problems:   PNA (pneumonia)   ESRD (end stage renal disease) (HCC)   Seizure disorder (HCC)   Essential hypertension   GERD (gastroesophageal reflux disease)   1-Acute hypoxic respiratory failure, in the setting of multilobar pneumonia, component of acute pulmonary edema in the setting of ESRD -Patient presented with worsening cough, weakness, shortness of breath.  Chest x-ray showed bilateral multilobar pneumonia, increased right pleural fluid. -Currently on 2 L of oxygen appears stable. -Assess desaturation screening. -Continue with IV ceftriaxone and oral doxycycline -Nephrology  has been consulted for hemodialysis -Legionella antigen pending and strep antigen in urine: Negative -MRSA PCR negative. -Follow blood cultures -Repeat chest x-ray in AM. -Continue duo-neb.   2-Essential hypertension, uncontrolled -Continue with carvedilol, Norvasc, clonidine and as needed hydralazine -Continue to follow vital signs.  3-Seizure disorder:  -No seizure activity appreciated -Continue with Keppra  4-GERD:  -Continue with PPI  5-ESRD:  -Status post hemodialysis on 05/03/2023 -For the most part volume appears to be stable -Planning to get dialysis again tomorrow to get patient back on schedule (Monday-Wednesday-Friday).  6-Anemia of chronic kidney disease:  -Iron and  Epogen per nephrology.   -No overt bleeding appreciated -Follow hemoglobin trend.    Estimated body mass index is 18.43 kg/m as calculated from the following:   Height as of this encounter: 5\' 2"  (1.575 m).   Weight as of this encounter: 45.7 kg.   DVT prophylaxis: Heparin Code Status: Full code Family Communication: Care discussed with patient Disposition Plan:  Status is: Inpatient Remains inpatient appropriate because: Management of pneumonia    Consultants:  Nephrology  Procedures:  None  Antimicrobials:    Subjective: Still feeling short winded with activity and having intermittent coughing spells; no nausea, no vomiting, no fever.   Objective: Vitals:   05/04/23 0835 05/04/23 0838 05/04/23 1424 05/04/23 1511  BP:    (!) 151/82  Pulse:    80  Resp:    18  Temp:    98.2 F (36.8 C)  TempSrc:    Oral  SpO2: 97% 99% 99% 100%  Weight:      Height:        Intake/Output Summary (Last 24 hours) at 05/04/2023 1813 Last data filed at 05/04/2023 1427 Gross per 24 hour  Intake 101.37 ml  Output 200 ml  Net -98.63 ml   Filed Weights   05/02/23 0946 05/03/23 1250 05/03/23 1705  Weight: 51.7 kg 48.9 kg 45.7 kg    Examination: General exam: Appears comfortable and in no major distress.  Patient is afebrile. Respiratory system: Positive rhonchi bilaterally; no using accessory muscles. Cardiovascular system:RRR. No rubs or gallops; no JVD. Gastrointestinal system: Abdomen is nondistended, soft and nontender. No organomegaly or masses felt. Normal bowel sounds heard. Central nervous system: No new focal neurological deficits. Extremities: No cyanosis or clubbing.  No petechiae. Skin: No petechiae; sacral ulcers present at time of admission without signs of superimposed infection.  Stage II. Psychiatry: Mood & affect appropriate.   Data Reviewed: I have personally reviewed following labs and imaging studies  CBC: Recent Labs  Lab 05/02/23 1008 05/03/23 0416   WBC 5.9 5.4  NEUTROABS 4.3  --   HGB 10.8* 10.2*  HCT 36.6 35.6*  MCV 97.6 97.3  PLT 180 189   Basic Metabolic Panel: Recent Labs  Lab 05/02/23 1008 05/03/23 0416  NA 135 133*  K 4.5 4.8  CL 97* 97*  CO2 25 21*  GLUCOSE 105* 82  BUN 56* 66*  CREATININE 5.07* 5.65*  CALCIUM 9.1 8.7*  MG 2.2  --   PHOS 5.3*  --    GFR: Estimated Creatinine Clearance: 8.1 mL/min (A) (by C-G formula based on SCr of 5.65 mg/dL (H)).  Liver Function Tests: Recent Labs  Lab 05/02/23 1008  AST 14*  ALT 12  ALKPHOS 98  BILITOT 0.5  PROT 7.8  ALBUMIN 2.9*   CBG: Recent Labs  Lab 05/02/23 1010  GLUCAP 97   Recent Results (from the past 240 hour(s))  Resp panel by RT-PCR (RSV, Flu A&B, Covid) Anterior Nasal Swab     Status: None   Collection Time: 05/02/23 10:08 AM   Specimen: Anterior Nasal Swab  Result Value Ref Range Status   SARS Coronavirus 2 by RT PCR NEGATIVE NEGATIVE Final    Comment: (NOTE) SARS-CoV-2 target nucleic acids are NOT DETECTED.  The SARS-CoV-2 RNA is generally detectable in upper respiratory specimens during the acute phase of infection. The lowest concentration of SARS-CoV-2 viral copies this assay can detect is 138 copies/mL. A negative result does not preclude SARS-Cov-2 infection and should not be used as the sole basis for treatment or other patient management decisions. A negative result may occur with  improper specimen collection/handling, submission of specimen other than nasopharyngeal swab, presence of viral mutation(s) within the areas targeted by this assay, and inadequate number of viral copies(<138 copies/mL). A negative result must be combined with clinical observations, patient history, and epidemiological information. The expected result is Negative.  Fact Sheet for Patients:  BloggerCourse.com  Fact Sheet for Healthcare Providers:  SeriousBroker.it  This test is no t yet approved or  cleared by the [place] FDA and  has been authorized for detection and/or diagnosis of SARS-CoV-2 by FDA under an Emergency Use Authorization (EUA). This EUA will remain  in effect (meaning this test can be used) for the duration of the COVID-19 declaration under Section 564(b)(1) of the Act, 21 U.S.C.section 360bbb-3(b)(1), unless the authorization is terminated  or revoked sooner.       Influenza A by PCR NEGATIVE NEGATIVE Final   Influenza B by PCR NEGATIVE NEGATIVE Final    Comment: (NOTE) The Xpert Xpress SARS-CoV-2/FLU/RSV plus assay is intended as an aid in the diagnosis of influenza from Nasopharyngeal swab specimens and should not be used as a sole basis for treatment. Nasal washings and aspirates are unacceptable for Xpert Xpress SARS-CoV-2/FLU/RSV testing.  Fact Sheet for Patients: BloggerCourse.com  Fact Sheet for Healthcare Providers: SeriousBroker.it  This test is not yet approved or cleared by the [place] FDA and has been authorized for detection and/or diagnosis of SARS-CoV-2 by FDA under an Emergency Use Authorization (EUA). This EUA will remain in effect (meaning this test can be used) for the duration of the COVID-19 declaration under Section 564(b)(1) of the Act, 21 U.S.C. section 360bbb-3(b)(1), unless the authorization is terminated or revoked.     Resp Syncytial Virus by PCR NEGATIVE NEGATIVE  Final    Comment: (NOTE) Fact Sheet for Patients: BloggerCourse.com  Fact Sheet for Healthcare Providers: SeriousBroker.it  This test is not yet approved or cleared by the [place] FDA and has been authorized for detection and/or diagnosis of SARS-CoV-2 by FDA under an Emergency Use Authorization (EUA). This EUA will remain in effect (meaning this test can be used) for the duration of the COVID-19 declaration under Section 564(b)(1) of the Act, 21  U.S.C. section 360bbb-3(b)(1), unless the authorization is terminated or revoked.  Performed at [facility], [address], [place], [place] [address]   Blood culture (routine x 2)     Status: None (Preliminary result)   Collection Time: 05/02/23 12:33 PM   Specimen: BLOOD  Result Value Ref Range Status   Specimen Description BLOOD BLOOD RIGHT ARM  Final   Special Requests Normal  Final   Culture   Final    NO GROWTH 2 DAYS Performed at [facility], [address], [place], [place] [address]    Report Status PENDING  Incomplete  Blood culture (routine x 2)     Status: None (Preliminary result)   Collection Time: 05/02/23 12:33 PM   Specimen: BLOOD  Result Value Ref Range Status   Specimen Description BLOOD BLOOD RIGHT HAND  Final   Special Requests Normal  Final   Culture   Final    NO GROWTH 2 DAYS Performed at [facility], [address] [place], [place] [address]    Report Status PENDING  Incomplete  MRSA Next Gen by PCR, Nasal     Status: None   Collection Time: 05/03/23  1:54 AM   Specimen: Nasal Mucosa; Nasal Swab  Result Value Ref Range Status   MRSA by PCR Next Gen NOT DETECTED NOT DETECTED Final    Comment: (NOTE) The GeneXpert MRSA Assay (FDA approved for NASAL specimens only), is one component of a comprehensive MRSA colonization surveillance program. It is not intended to diagnose MRSA infection nor to guide or monitor treatment for MRSA infections. Test performance is not FDA approved in patients less than 43 years old. Performed at [facility], [address], [place], [place] [address]      Radiology Studies: No results found.   Scheduled Meds:  amLODipine  5 mg Oral Daily   budesonide (PULMICORT) nebulizer solution  0.5 mg Nebulization BID   carvedilol  12.5 mg Oral BID WC   Chlorhexidine Gluconate Cloth  6 each Topical Q0600   [START ON 05/05/2023] Chlorhexidine Gluconate Cloth  6 each Topical Q0600   cloNIDine  0.1 mg Oral BID    dextromethorphan-guaiFENesin  1 tablet Oral BID   doxycycline  100 mg Oral Q12H   heparin injection (subcutaneous)  5,000 Units Subcutaneous Q8H   ipratropium-albuterol  3 mL Nebulization TID   levETIRAcetam  1,000 mg Oral Daily   levETIRAcetam  1,000 mg Oral Q M,W,F-HD   pantoprazole  40 mg Oral Daily   senna-docusate  2 tablet Oral QHS   sevelamer carbonate  800 mg Oral TID WC   sodium chloride flush  3 mL Intravenous Q12H   Continuous Infusions:  cefTRIAXone (ROCEPHIN)  IV Stopped (05/04/23 0610)     LOS: 2 days    Time spent: 50 minutes    Vassie Loll, MD [facility]   If 7PM-7AM, please contact night-coverage [email or web address]  05/04/2023, 6:13 PM

## 2023-05-04 NOTE — Plan of Care (Signed)

## 2023-05-04 NOTE — Consult Note (Signed)
[facility] Nephrology Consult Note: Reason for Consult: To manage dialysis and dialysis related needs Referring Physician: Dr. Gwenlyn Perking, Mikle Bosworth  HPI:  [name] is an 57 y.o. female with past medical history significant for hypertension, seizure disorder, acid reflux, ESRD on HD MWF who was presented with a complaint of weakness, dyspnea and hypoxia seen as a consultation for the evaluation and management of ESRD. The evaluation demonstrated multilobar pneumonia and acute hypoxic respiratory failure with hypoxia.  Patient was admitted and being treated with antibiotics including ceftriaxone, doxycycline.  She missed her dialysis on Friday therefore received treatment yesterday and tolerated well with a liter UF.  She is currently on ceftriaxone and doxycycline.  Blood pressure is variable and currently requiring about 2.5 L of oxygen.  She reports that her breathing is much better.  Denies nausea, vomiting, chest pain.  Outpatient HD orders below.  Outpatient HD orders: [facility]. MWF. 3 hours. EDW 44.5 kg. LUE AVF. Flow rates: 400/500. 3K bath. Heparin: No.   Past Medical History:  Diagnosis Date   Abnormal uterine bleeding (AUB) 06/15/2014   Cancer (HCC)    uterine   High blood pressure    Paraplegia (lower)    Renal disorder    Seizure disorder (HCC)    Seizures (HCC)    Suprapubic catheter (HCC)    Urinary tract infection     Past Surgical History:  Procedure Laterality Date   APPLICATION OF WOUND VAC Right 09/13/2021   (approximately 1-34mos ago) pressure sore on right hip   BACK SURGERY     Pt stated "before 2000"   BIOPSY  12/03/2022   Procedure: BIOPSY;  Surgeon: Dolores Frame, MD;  [facility]: [facility];  Service: Gastroenterology;;   ESOPHAGOGASTRODUODENOSCOPY N/A 09/20/2015   Procedure: ESOPHAGOGASTRODUODENOSCOPY (EGD);  Surgeon: Malissa Hippo, MD;  Location: [facility];  Service: Endoscopy;  Laterality: N/A;  730    ESOPHAGOGASTRODUODENOSCOPY (EGD) WITH PROPOFOL N/A 12/03/2022   Procedure: ESOPHAGOGASTRODUODENOSCOPY (EGD) WITH PROPOFOL;  Surgeon: Dolores Frame, MD;  Location: [facility];  Service: Gastroenterology;  Laterality: N/A;  1:15 pm, asa 3, pt knows to arrive at 10:30  dialysis pt, M,W & F   IR CATHETER TUBE CHANGE  04/02/2018   PERCUTANEOUS ENDOSCOPIC GASTROSTOMY (PEG) REMOVAL N/A 09/20/2015   Procedure: PERCUTANEOUS ENDOSCOPIC GASTROSTOMY (PEG) REMOVAL;  Surgeon: Malissa Hippo, MD;  [facility];  Service: Endoscopy;  Laterality: N/A;   TEE WITHOUT CARDIOVERSION N/A 11/11/2022   Procedure: TRANSESOPHAGEAL ECHOCARDIOGRAM (TEE);  Surgeon: Pricilla Riffle, MD;  Location: [facility];  Service: Cardiovascular;  Laterality: N/A;    Family History  Problem Relation Age of Onset   Cancer Mother    Hypertension Mother    Cancer Sister        breast and then spread everywhere.   Diabetes Paternal Grandmother    Hypertension Paternal Grandmother     Social History:  reports that she has never smoked. She has never used smokeless tobacco. She reports current alcohol use. She reports that she does not use drugs.  Allergies:  Allergies  Allergen Reactions   Benadryl [Diphenhydramine Hcl (Sleep)] Hives   Daptomycin Hives   Linezolid Other (See Comments)    Patient self-discontinued treatment due to GI intolerance. Taking it along with moxifloxacin   Moxifloxacin Other (See Comments)    Patient self-discontinued treatment due to GI intolerance. Taking it along with linezolid   Quinine Derivatives Other (See Comments)    Alters mental status  Vancomycin Other (See Comments)    Pt is tolerating this medication at HD   Azithromycin Itching and Rash   Tetracycline Itching    Able to tolerate Doxycycline.    Zosyn [Piperacillin Sod-Tazobactam So] Rash    Medications: I have reviewed the patient's current medications.   Results for orders placed or performed during the  hospital encounter of 05/02/23 (from the past 48 hour(s))  Blood culture (routine x 2)     Status: None (Preliminary result)   Collection Time: 05/02/23 12:33 PM   Specimen: BLOOD  Result Value Ref Range   Specimen Description BLOOD BLOOD RIGHT ARM    Special Requests Normal    Culture      NO GROWTH 2 DAYS Performed at [facility], [address], [place], [place] [address]    Report Status PENDING   Blood culture (routine x 2)     Status: None (Preliminary result)   Collection Time: 05/02/23 12:33 PM   Specimen: BLOOD  Result Value Ref Range   Specimen Description BLOOD BLOOD RIGHT HAND    Special Requests Normal    Culture      NO GROWTH 2 DAYS Performed at [facility], [address], [place], [place] [address]    Report Status PENDING   Strep pneumoniae urinary antigen     Status: None   Collection Time: 05/02/23  3:17 PM  Result Value Ref Range   Strep Pneumo Urinary Antigen NEGATIVE NEGATIVE    Comment:        Infection due to S. pneumoniae cannot be absolutely ruled out since the antigen present may be below the detection limit of the test. Performed at [facility], [address], [place], [place] [address]   HIV Antibody (routine testing w rflx)     Status: None   Collection Time: 05/02/23  5:18 PM  Result Value Ref Range   HIV Screen 4th Generation wRfx Non Reactive Non Reactive    Comment: Performed at [facility], [address] [place], [place] [address]  Hepatitis B surface antigen     Status: None   Collection Time: 05/02/23  5:18 PM  Result Value Ref Range   Hepatitis B Surface Ag NON REACTIVE NON REACTIVE    Comment: Performed at [facility], [address], [place], [place] [address]  Hepatitis B surface antibody,quantitative     Status: None   Collection Time: 05/02/23  5:18 PM  Result Value Ref Range   Hep B S AB Quant (Post) 297.0 Immunity>10 mIU/mL    Comment: (NOTE)  Status of Immunity                     Anti-HBs Level   ------------------                     -------------- Inconsistent with Immunity                  0.0 - 10.0 Consistent with Immunity                         >10.0 Performed At: [facility] [address] [place], [place] [id number] Jolene Schimke MD ON:[id number]   MRSA Next Gen by PCR, Nasal     Status: None   Collection Time: 05/03/23  1:54 AM   Specimen: Nasal Mucosa; Nasal Swab  Result Value Ref Range   MRSA by PCR  Next Gen NOT DETECTED NOT DETECTED    Comment: (NOTE) The GeneXpert MRSA Assay (FDA approved for NASAL specimens only), is one component of a comprehensive MRSA colonization surveillance program. It is not intended to diagnose MRSA infection nor to guide or monitor treatment for MRSA infections. Test performance is not FDA approved in patients less than 36 years old. Performed at [facility], [address], [place], [place] [address]   CBC     Status: Abnormal   Collection Time: 05/03/23  4:16 AM  Result Value Ref Range   WBC 5.4 4.0 - 10.5 K/uL   RBC 3.66 (L) 3.87 - 5.11 MIL/uL   Hemoglobin 10.2 (L) 12.0 - 15.0 g/dL   HCT 47.8 (L) 29.5 - 62.1 %   MCV 97.3 80.0 - 100.0 fL   MCH 27.9 26.0 - 34.0 pg   MCHC 28.7 (L) 30.0 - 36.0 g/dL   RDW 30.8 (H) 65.7 - 84.6 %   Platelets 189 150 - 400 K/uL   nRBC 0.0 0.0 - 0.2 %    Comment: Performed at [facility], [address] [place], [place] [address]  Basic metabolic panel     Status: Abnormal   Collection Time: 05/03/23  4:16 AM  Result Value Ref Range   Sodium 133 (L) 135 - 145 mmol/L   Potassium 4.8 3.5 - 5.1 mmol/L   Chloride 97 (L) 98 - 111 mmol/L   CO2 21 (L) 22 - 32 mmol/L   Glucose, Bld 82 70 - 99 mg/dL    Comment: Glucose reference range applies only to samples taken after fasting for at least 8 hours.   BUN 66 (H) 6 - 20 mg/dL   Creatinine, Ser 2.84 (H) 0.44 - 1.00 mg/dL   Calcium 8.7 (L) 8.9 - 10.3 mg/dL   GFR, Estimated 8 (L) >60 mL/min    Comment: (NOTE) Calculated using the CKD-EPI  Creatinine Equation (2021)    Anion gap 15 5 - 15    Comment: Performed at [facility], [address] [place], [place] [address]    No results found.  ROS: As per H&P, rest of systems reviewed and negative. Blood pressure (!) 176/85, pulse 80, temperature 98.1 F (36.7 C), temperature source Oral, resp. rate 18, height 5\' 2"  (1.575 m), weight 45.7 kg, SpO2 99%. Gen: NAD, comfortable Respiratory: Clear bilateral, no wheezing or crackle Cardiovascular: Regular rate rhythm S1-S2 normal, no rubs GI: Abdomen soft, nontender, nondistended Extremities, no cyanosis or clubbing, no edema Skin: No rash or ulcer Neurology: Alert, awake, following commands, oriented Dialysis Access: Left upper extremity AV fistula has good thrill and bruit.  Assessment/Plan:  # Acute respiratory failure with hypoxia due to bilateral multilobar pneumonia: Currently on ceftriaxone and doxycycline.  Volume management with HD.  Requiring mitral liters of oxygen.  # ESRD: MWF, status post HD yesterday office schedule with 3 L UF, tolerated well.  Plan to maintain regular schedule of dialysis tomorrow.  AV fistula for the access.  # Hypertension: Continue current antihypertensives and manage UF with HD.  # Anemia of ESRD: Hemoglobin at goal, obtain outpatient record about erythropoietin treatment.  # CKD-metabolic Bone Disease: Continue sevelamer and monitor calcium phosphorus level.  Thank you for the consult, we will continue to follow with you.  Trulee Hamstra Jaynie Collins 05/04/2023, 12:02 PM

## 2023-05-05 ENCOUNTER — Inpatient Hospital Stay (HOSPITAL_COMMUNITY): Payer: 59

## 2023-05-05 DIAGNOSIS — G40909 Epilepsy, unspecified, not intractable, without status epilepticus: Secondary | ICD-10-CM | POA: Diagnosis not present

## 2023-05-05 DIAGNOSIS — N186 End stage renal disease: Secondary | ICD-10-CM | POA: Diagnosis not present

## 2023-05-05 DIAGNOSIS — J9601 Acute respiratory failure with hypoxia: Secondary | ICD-10-CM | POA: Diagnosis not present

## 2023-05-05 DIAGNOSIS — A481 Legionnaires' disease: Secondary | ICD-10-CM

## 2023-05-05 DIAGNOSIS — K219 Gastro-esophageal reflux disease without esophagitis: Secondary | ICD-10-CM | POA: Diagnosis not present

## 2023-05-05 LAB — RENAL FUNCTION PANEL
Albumin: 2.7 g/dL — ABNORMAL LOW (ref 3.5–5.0)
Anion gap: 14 (ref 5–15)
BUN: 58 mg/dL — ABNORMAL HIGH (ref 6–20)
CO2: 23 mmol/L (ref 22–32)
Calcium: 8.7 mg/dL — ABNORMAL LOW (ref 8.9–10.3)
Chloride: 94 mmol/L — ABNORMAL LOW (ref 98–111)
Creatinine, Ser: 5.72 mg/dL — ABNORMAL HIGH (ref 0.44–1.00)
GFR, Estimated: 8 mL/min — ABNORMAL LOW (ref >60)
Glucose, Bld: 113 mg/dL — ABNORMAL HIGH (ref 70–99)
Phosphorus: 6.3 mg/dL — ABNORMAL HIGH (ref 2.5–4.6)
Potassium: 4 mmol/L (ref 3.5–5.1)
Sodium: 131 mmol/L — ABNORMAL LOW (ref 135–145)

## 2023-05-05 LAB — CBC
HCT: 37.3 % (ref 36.0–46.0)
Hemoglobin: 11.3 g/dL — ABNORMAL LOW (ref 12.0–15.0)
MCH: 28.5 pg (ref 26.0–34.0)
MCHC: 30.3 g/dL (ref 30.0–36.0)
MCV: 94.2 fL (ref 80.0–100.0)
Platelets: 217 10*3/uL (ref 150–400)
RBC: 3.96 MIL/uL (ref 3.87–5.11)
RDW: 18.6 % — ABNORMAL HIGH (ref 11.5–15.5)
WBC: 4.9 10*3/uL (ref 4.0–10.5)
nRBC: 0 % (ref 0.0–0.2)

## 2023-05-05 LAB — LEGIONELLA PNEUMOPHILA SEROGP 1 UR AG: L. pneumophila Serogp 1 Ur Ag: POSITIVE

## 2023-05-05 MED ORDER — CALCITRIOL 0.25 MCG PO CAPS
1.0000 ug | ORAL_CAPSULE | ORAL | Status: DC
  Administered 2023-05-05: 1 ug via ORAL
  Filled 2023-05-05: qty 4

## 2023-05-05 MED ORDER — LEVOFLOXACIN 500 MG PO TABS: 500.0000 mg | ORAL_TABLET | ORAL | 0 refills | Status: AC

## 2023-05-05 MED ORDER — DM-GUAIFENESIN ER 30-600 MG PO TB12: 1.0000 | ORAL_TABLET | Freq: Two times a day (BID) | ORAL | 0 refills | Status: AC

## 2023-05-05 NOTE — Progress Notes (Signed)
20:00 neb treatment missed by rt error 05/04/23

## 2023-05-05 NOTE — TOC Initial Note (Signed)
Transition of Care [facility]) - Initial/Assessment Note    Patient Details  Name: [name] MRN: [id number] Date of Birth: 1965-11-21  Transition of Care [facility]) CM/SW Contact:    Elliot Gault, LCSW Phone Number: 05/05/2023, 11:38 AM  Clinical Narrative:                  Pt admitted from home. She is high risk for readmission. Met with pt at bedside to assess.  Pt reports she lives at home with her daughter who is her aide. Pt active with [facility] RN for wound care. Pt is not ambulatory. She has a hospital bed, hoyer lift, power wheelchair, and manual wheelchair for DME.   Pt uses [facility] or [facility] for appointment transportation.   Per MD, pt will dc home today after HD. Updated pt's daughter who states she will be able to pick up patient.   Asked MD for [facility] RN resumption orders. Updated Clydie Braun at [employer] of pt's dc for later today.   No other TOC needs identified.  Expected Discharge Plan: Home w Home Health Services Barriers to Discharge: Barriers Resolved   Patient Goals and CMS Choice   CMS Medicare.gov Compare Post Acute Care list provided to:: Patient        Expected Discharge Plan and Services     Post Acute Care Choice: Resumption of Svcs/PTA Provider Living arrangements for the past 2 months: Single Family Home                                      Prior Living Arrangements/Services Living arrangements for the past 2 months: Single Family Home Lives with:: Adult Children Patient language and need for interpreter reviewed:: Yes Do you feel safe going back to the place where you live?: Yes      Need for Family Participation in Patient Care: Yes (Comment) Care giver support system in place?: Yes (comment) Current home services: DME, Home RN Criminal Activity/Legal Involvement Pertinent to Current Situation/Hospitalization: No - Comment as needed  Activities of Daily Living   ADL Screening (condition at time of admission) Independently performs  ADLs?: Yes (appropriate for developmental age) Does the patient have a NEW difficulty with bathing/dressing/toileting/self-feeding that is expected to last >3 days?: No Does the patient have a NEW difficulty with getting in/out of bed, walking, or climbing stairs that is expected to last >3 days?: No Does the patient have a NEW difficulty with communication that is expected to last >3 days?: No Is the patient deaf or have difficulty hearing?: No Does the patient have difficulty seeing, even when wearing glasses/contacts?: No Does the patient have difficulty concentrating, remembering, or making decisions?: No  Permission Sought/Granted                  Emotional Assessment Appearance:: Appears stated age Attitude/Demeanor/Rapport: Engaged Affect (typically observed): Pleasant Orientation: : Oriented to Self, Oriented to Place, Oriented to  Time, Oriented to Situation Alcohol / Substance Use: Not Applicable Psych Involvement: No (comment)  Admission diagnosis:  Acute respiratory failure with hypoxia (HCC) [J96.01] Patient Active Problem List   Diagnosis Date Noted   Acute respiratory failure with hypoxia (HCC) 05/02/2023   Pulmonary edema 04/13/2023   Diarrhea 04/13/2023   Respiratory failure with hypoxia (HCC) 04/01/2023   Dyspnea and respiratory abnormalities 03/17/2023   Dyspnea 03/05/2023   Retroperitoneal hematoma 12/31/2022   Subcutaneous hematoma 12/31/2022   Hip osteomyelitis,  right (HCC) 12/18/2022   Sepsis due to undetermined organism (HCC) 12/18/2022   Chronic pain 12/14/2022   Bacteremia 11/08/2022   Loss of weight 10/26/2022   N&V (nausea and vomiting) 10/26/2022   Cellulitis of knee, left 07/24/2022   Infected decubitus ulcer 07/23/2022   Osteomyelitis of multiple sites-chronic     Chronic anemia 02/25/2022   ESRD (end stage renal disease) (HCC) 02/25/2022   Abscess of buttock, right 08/25/2021   GERD (gastroesophageal reflux disease) 08/25/2021   Anxiety  08/25/2021   Decubitus ulcers 08/25/2021   Anemia 08/24/2021   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 chronic kidney disease (HCC) 04/25/2017   AKI (acute kidney injury) (HCC) 04/25/2017   Dehydration 04/25/2017   Metabolic acidosis 04/25/2017   Altered mental status 06/22/2016   Altered mental state 08/16/2015   Pressure ulcer 05/16/2015   Acute encephalopathy 05/15/2015   Malnutrition (HCC)    Encephalopathy acute 10/10/2014   Sepsis (HCC) 10/10/2014   PNA (pneumonia) 10/10/2014   Encephalomalacia 10/10/2014   TBI (traumatic brain injury) (HCC) 10/10/2014   Severe protein-calorie malnutrition (HCC) 10/10/2014   UTI (urinary tract infection) 08/26/2014   Hypokalemia 08/26/2014   Abnormal uterine bleeding (AUB) 06/15/2014   Essential hypertension    Seizure disorder (HCC)    Status epilepticus (HCC) 05/02/2014   Hypertensive emergency without congestive heart failure 05/02/2014   Acute renal failure (HCC) 05/21/2013   Hyperkalemia 05/21/2013   Hypothermia 05/21/2013   Paraplegia (HCC) 06/25/2012   Suprapubic catheter (HCC) 06/25/2012   Decubitus ulcer of buttock 06/25/2012   PCP:  Richarda Osmond, FNP Pharmacy:   [facility] [place], [place] - [address] [place] [address] Phone: [phone] Fax: [phone]     Social Determinants of Health (SDOH) Social History: SDOH Screenings   Food Insecurity: No Food Insecurity (05/02/2023)  Housing: Patient Declined (05/02/2023)  Transportation Needs: No Transportation Needs (05/02/2023)  Utilities: Not At Risk (05/02/2023)  Tobacco Use: Low Risk  (05/02/2023)   SDOH Interventions:     Readmission Risk Interventions    05/03/2023    3:06 PM 04/14/2023   11:47 AM 04/01/2023   11:36 AM  Readmission Risk Prevention Plan  Transportation Screening Complete Complete Complete  Medication Review (RN Care Manager) Complete Complete Referral to Pharmacy  PCP or Specialist appointment within 3-5  days of discharge Complete Not Complete Complete  HRI or Home Care Consult Complete Complete Complete  SW Recovery Care/Counseling Consult Complete Complete Complete  Palliative Care Screening Not Applicable Not Applicable Not Applicable  Skilled Nursing Facility Not Applicable Not Applicable Not Applicable

## 2023-05-05 NOTE — Progress Notes (Signed)
SATURATION QUALIFICATIONS: (This note is used to comply with regulatory documentation for home oxygen)  Patient Saturations on Room Air at Rest = 94%  Patient is bed bound.   Please briefly explain why patient needs home oxygen: Patient oxygen saturations remained 94-96% on room air at rest sitting up in the bed.

## 2023-05-05 NOTE — Care Management Important Message (Signed)
Important Message  Patient Details  Name: [name] MRN: [id number] Date of Birth: [birth year]   Important Message Given:  Yes - Medicare IM     Corey Harold 05/05/2023, 11:30 AM

## 2023-05-05 NOTE — Progress Notes (Addendum)
CRITICAL VALUE STICKER  CRITICAL VALUE: Legionella urine positive  RECEIVER (on-site recipient of call): Marvell Fuller, RN   DATE & TIME NOTIFIED: 05/05/2023 at 1435  MESSENGER (representative from lab): Alphonzo Lemmings  MD NOTIFIED: Gwenlyn Perking  TIME OF NOTIFICATION:05/05/2023 at 1435  RESPONSE: Dr. Gwenlyn Perking stated that he would adjust oral antibiotics at discharge to cover.

## 2023-05-05 NOTE — Plan of Care (Signed)

## 2023-05-05 NOTE — Discharge Summary (Signed)
Physician Discharge Summary   Patient: [name] MRN: [id number] DOB: Feb 03, 1966  Admit date:     05/02/2023  Discharge date: 05/05/23  Discharge Physician: Vassie Loll   PCP: Richarda Osmond, FNP   Recommendations at discharge:  Repeat chest x-ray 8 in 6 weeks to assure resolution of infiltrate Reassess blood pressure and adjust antihypertensive regimen as required Repeat CBC to follow hemoglobin trend/stability   Discharge Diagnoses: Principal Problem:   Acute respiratory failure with hypoxia (HCC) Active Problems:   PNA (pneumonia)   ESRD (end stage renal disease) (HCC)   Seizure disorder (HCC)   Essential hypertension   GERD (gastroesophageal reflux disease)   Legionella pneumonia (HCC)  Brief Narrative: 57 year old with past medical history significant for suprapubic catheter, end-stage renal disease, hypertension, seizure disorder, GERD, presents complaining of weakness, worsening shortness of breath, subjective fever.  She was found to be hypoxic during EMS evaluation.  Oxygen sat in the low 80.   Evaluation in the ED consistent with multilobar pneumonia.  Admitted for further care  Assessment and Plan:  1-Acute hypoxic respiratory failure, in the setting of multilobar Legionella pneumonia, component of acute pulmonary edema in the setting of ESRD -Patient presented with worsening cough, weakness, shortness of breath.  Chest x-ray showed bilateral multilobar pneumonia, increased right pleural fluid. -Patient with positive Legionella urine antigen -No requiring oxygen supplementation at discharge -Overall improved and feeling better. -Patient will use oral Levaquin to complete treatment for pneumonia (speciation and sensitivity as recommended by ID service). -strep antigen in urine: Negative -MRSA PCR negative. -Blood cultures remain negative. -Continue mucolytic's and bronchodilator management as needed.   2-Essential hypertension,  uncontrolled -Resume adjusted home antihypertensive agents -Follow vital signs as and readjust medications as required.   3-Seizure disorder:  -No seizure activity appreciated -Continue with Keppra   4-GERD:  -Continue with PPI   5-ESRD:  -Status post hemodialysis on 05/03/2023 and back to outpatient schedule with dialysis on 05/05/2023.   -For the most part volume appears to be stable and no abnormalities appreciated on her electrolytes. -Planning to get dialysis again tomorrow to get patient back on schedule (Monday-Wednesday-Friday). -Resume outpatient hemodialysis treatment with next dialysis on 05/07/2023.   6-Anemia of chronic kidney disease:  -Iron and Epogen per nephrology.   -No overt bleeding appreciated -Follow hemoglobin trend.  7-stage IV bilateral ischial pressure injury -No signs of acute infection appreciated -Pressure injuries present at time of admission -Continue wound care and constant repositioning. -Appreciate recommendation by wound care service.  Consultants: Nephrology service; ID curbside (Dr. Luciana Axe). Procedures performed: See below for x-ray reports. Disposition: Home Diet recommendation: Low-sodium/renal diet.  DISCHARGE MEDICATION: Allergies as of 05/05/2023       Reactions   Benadryl [diphenhydramine Hcl (sleep)] Hives   Daptomycin Hives   Linezolid Other (See Comments)   Patient self-discontinued treatment due to GI intolerance. Taking it along with moxifloxacin   Moxifloxacin Other (See Comments)   Patient self-discontinued treatment due to GI intolerance. Taking it along with linezolid   Quinine Derivatives Other (See Comments)   Alters mental status   Vancomycin Other (See Comments)   Pt is tolerating this medication at HD   Azithromycin Itching, Rash   Tetracycline Itching   Able to tolerate Doxycycline.    Zosyn [piperacillin Sod-tazobactam So] Rash        Medication List     STOP taking these medications    furosemide 20 MG  tablet Commonly known as: LASIX   ibuprofen 100 MG tablet  Commonly known as: ADVIL   irbesartan 150 MG tablet Commonly known as: AVAPRO       TAKE these medications    acetaminophen 500 MG tablet Commonly known as: TYLENOL Take 1,000 mg by mouth every 6 (six) hours as needed for moderate pain.   albuterol (2.5 MG/3ML) 0.083% nebulizer solution Commonly known as: PROVENTIL Take 3 mLs (2.5 mg total) by nebulization every 4 (four) hours as needed for wheezing or shortness of breath.   amLODipine 5 MG tablet Commonly known as: NORVASC Take 1 tablet (5 mg total) by mouth daily. Take 1 tablet on Monday,Wednesday and Friday then take 2 tablets on Tuesday,Thursday,Saturday and Sunday.   carvedilol 12.5 MG tablet Commonly known as: COREG Take 1 tablet (12.5 mg total) by mouth 2 (two) times daily with a meal.   cloNIDine 0.1 MG tablet Commonly known as: CATAPRES Take 1 tablet (0.1 mg total) by mouth 2 (two) times daily.   Compressor/Nebulizer Misc 1 Units by Does not apply route as needed.   cyclobenzaprine 5 MG tablet Commonly known as: FLEXERIL Take 5 mg by mouth 3 (three) times daily as needed for muscle spasms.   dextromethorphan-guaiFENesin 30-600 MG 12hr tablet Commonly known as: MUCINEX DM Take 1 tablet by mouth 2 (two) times daily for 10 days.   levETIRAcetam 500 MG tablet Commonly known as: KEPPRA Take 2 tablets (1,000 mg total) by mouth every evening. Take an additional tablet on Monday,Wednesday and Friday evening after Dialysis   levofloxacin 500 MG tablet Commonly known as: LEVAQUIN Take 1 tablet (500 mg total) by mouth every other day for 4 doses.   lidocaine-prilocaine cream Commonly known as: EMLA Apply 1 Application topically 3 (three) times a week.   naloxone 4 MG/0.1ML Liqd nasal spray kit Commonly known as: NARCAN Place 0.4 mg into the nose once.   pantoprazole 40 MG tablet Commonly known as: PROTONIX Take 40 mg by mouth daily.    senna-docusate 8.6-50 MG tablet Commonly known as: Senokot-S Take 2 tablets by mouth at bedtime.   sevelamer carbonate 800 MG tablet Commonly known as: RENVELA Take 800 mg by mouth 3 (three) times daily.               Discharge Care Instructions  (From admission, onward)           Start     Ordered   05/05/23 0000  Discharge wound care:       Comments: Apply Aquacel [place] # [phone]) to left and right ischium wounds Q Mon/Wed/Fri.  Cover with foam dressing.  Moisten with NS to remove previous Aquacel   05/05/23 1654            Follow-up Information     [facility], [name], [place]. Schedule an appointment as soon as possible for a visit in 10 day(s).   Specialty: Internal Medicine Contact information: [address] [place] [address] [phone]                Discharge Exam: Ceasar Mons Weights   05/03/23 1250 05/03/23 1705 05/05/23 1255  Weight: 48.9 kg 45.7 kg 46.2 kg   General exam: Alert, awake, oriented x 3; following commands appropriately and in no acute distress. Respiratory system: Improved air movement bilaterally; no using accessory muscles and demonstrating good saturation on room air.  Positive scattered rhonchi resting appreciated. Cardiovascular system:RRR. No rubs or gallops; no JVD. Gastrointestinal system: Abdomen is nondistended, soft and nontender. No organomegaly or masses  felt. Normal bowel sounds heard. Central nervous system: No new focal neurological deficits. Extremities: No C/C/E, +pedal pulses Skin: No petechiae; bilateral ischial stage IV pressure injury present at time of admission without signs of superimposed infection. Psychiatry: Judgement and insight appear normal.  Flat affect appreciated on exam.   Condition at discharge: Stable and improved.  The results of significant diagnostics from this hospitalization (including imaging, microbiology, ancillary and laboratory) are listed  below for reference.   Imaging Studies: DG CHEST PORT 1 VIEW  Result Date: 05/05/2023 CLINICAL DATA:  Shortness of breath EXAM: PORTABLE CHEST 1 VIEW COMPARISON:  05/02/2023 FINDINGS: No significant change in AP portable chest radiograph. Gross cardiomegaly with right-greater-than-left layering pleural effusions. No new airspace opacity. No acute osseous findings. IMPRESSION: No significant change in AP portable chest radiograph. Gross cardiomegaly with right-greater-than-left layering pleural effusions. No new airspace opacity. Electronically Signed   By: Jearld Lesch M.D.   On: 05/05/2023 13:57   DG Chest 2 View  Result Date: 05/02/2023 CLINICAL DATA:  Weakness. EXAM: CHEST - 2 VIEW COMPARISON:  04/15/2023.  Chest CTA dated 03/05/2023 FINDINGS: Improved inspiration. Grossly stable enlarged cardiac silhouette. Dense patchy opacity in both lower lobes with additional patchy airspace opacity in the right middle lobe posterior upper lobe region on the lateral view. Increased right pleural fluid. Thoracic spine fixation hardware. IMPRESSION: 1. Bilateral multi lobar pneumonia. 2. Increased right pleural fluid. 3. Stable cardiomegaly. Electronically Signed   By: Beckie Salts M.D.   On: 05/02/2023 11:11   DG Chest 2 View  Result Date: 04/15/2023 CLINICAL DATA:  Dyspnea. EXAM: CHEST - 2 VIEW COMPARISON:  April 13, 2023. FINDINGS: Stable cardiomegaly. Status post surgical fusion of thoracic spine. Old right clavicular fracture is noted. Minimal bibasilar subsegmental atelectasis is noted with small pleural effusions. IMPRESSION: Minimal bibasilar subsegmental atelectasis is noted with minimal pleural effusions. Electronically Signed   By: Lupita Raider M.D.   On: 04/15/2023 13:43   DG Chest Port 1 View  Result Date: 04/13/2023 CLINICAL DATA:  Shortness of breath. EXAM: PORTABLE CHEST 1 VIEW COMPARISON:  March 31, 2023 FINDINGS: Enlarged cardiac silhouette.  Mediastinal contours appear intact.  Interstitial pulmonary edema. Peribronchial airspace consolidation in the lower lobes. Small pleural effusions cannot be excluded. Stable spinal fusion.  Soft tissues are grossly normal. IMPRESSION: 1. Peribronchial airspace consolidation the lower lobes. This may represent infectious consolidation or atelectasis. 2. Interstitial pulmonary edema. Small pleural effusions cannot be excluded. 3. Enlarged cardiac silhouette. Electronically Signed   By: Ted Mcalpine M.D.   On: 04/13/2023 17:25    Microbiology: Results for orders placed or performed during the hospital encounter of 05/02/23  Resp panel by RT-PCR (RSV, Flu A&B, Covid) Anterior Nasal Swab     Status: None   Collection Time: 05/02/23 10:08 AM   Specimen: Anterior Nasal Swab  Result Value Ref Range Status   SARS Coronavirus 2 by RT PCR NEGATIVE NEGATIVE Final    Comment: (NOTE) SARS-CoV-2 target nucleic acids are NOT DETECTED.  The SARS-CoV-2 RNA is generally detectable in upper respiratory specimens during the acute phase of infection. The lowest concentration of SARS-CoV-2 viral copies this assay can detect is 138 copies/mL. A negative result does not preclude SARS-Cov-2 infection and should not be used as the sole basis for treatment or other patient management decisions. A negative result may occur with  improper specimen collection/handling, submission of specimen other than nasopharyngeal swab, presence of viral mutation(s) within the areas targeted by this assay, and  inadequate number of viral copies(<138 copies/mL). A negative result must be combined with clinical observations, patient history, and epidemiological information. The expected result is Negative.  Fact Sheet for Patients:  BloggerCourse.com  Fact Sheet for Healthcare Providers:  SeriousBroker.it  This test is no t yet approved or cleared by the [place] FDA and  has been authorized for detection  and/or diagnosis of SARS-CoV-2 by FDA under an Emergency Use Authorization (EUA). This EUA will remain  in effect (meaning this test can be used) for the duration of the COVID-19 declaration under Section 564(b)(1) of the Act, 21 U.S.C.section 360bbb-3(b)(1), unless the authorization is terminated  or revoked sooner.       Influenza A by PCR NEGATIVE NEGATIVE Final   Influenza B by PCR NEGATIVE NEGATIVE Final    Comment: (NOTE) The Xpert Xpress SARS-CoV-2/FLU/RSV plus assay is intended as an aid in the diagnosis of influenza from Nasopharyngeal swab specimens and should not be used as a sole basis for treatment. Nasal washings and aspirates are unacceptable for Xpert Xpress SARS-CoV-2/FLU/RSV testing.  Fact Sheet for Patients: BloggerCourse.com  Fact Sheet for Healthcare Providers: SeriousBroker.it  This test is not yet approved or cleared by the [place] FDA and has been authorized for detection and/or diagnosis of SARS-CoV-2 by FDA under an Emergency Use Authorization (EUA). This EUA will remain in effect (meaning this test can be used) for the duration of the COVID-19 declaration under Section 564(b)(1) of the Act, 21 U.S.C. section 360bbb-3(b)(1), unless the authorization is terminated or revoked.     Resp Syncytial Virus by PCR NEGATIVE NEGATIVE Final    Comment: (NOTE) Fact Sheet for Patients: BloggerCourse.com  Fact Sheet for Healthcare Providers: SeriousBroker.it  This test is not yet approved or cleared by the [place] FDA and has been authorized for detection and/or diagnosis of SARS-CoV-2 by FDA under an Emergency Use Authorization (EUA). This EUA will remain in effect (meaning this test can be used) for the duration of the COVID-19 declaration under Section 564(b)(1) of the Act, 21 U.S.C. section 360bbb-3(b)(1), unless the authorization is terminated  or revoked.  Performed at [facility], [address], [place], [place] [address]   Blood culture (routine x 2)     Status: None (Preliminary result)   Collection Time: 05/02/23 12:33 PM   Specimen: BLOOD  Result Value Ref Range Status   Specimen Description BLOOD BLOOD RIGHT ARM  Final   Special Requests Normal  Final   Culture   Final    NO GROWTH 3 DAYS Performed at [facility], [address] [place], [place] [address]    Report Status PENDING  Incomplete  Blood culture (routine x 2)     Status: None (Preliminary result)   Collection Time: 05/02/23 12:33 PM   Specimen: BLOOD  Result Value Ref Range Status   Specimen Description BLOOD BLOOD RIGHT HAND  Final   Special Requests Normal  Final   Culture   Final    NO GROWTH 3 DAYS Performed at [facility], [address] [place], [place] [address]    Report Status PENDING  Incomplete  MRSA Next Gen by PCR, Nasal     Status: None   Collection Time: 05/03/23  1:54 AM   Specimen: Nasal Mucosa; Nasal Swab  Result Value Ref Range Status   MRSA by PCR Next Gen NOT DETECTED NOT DETECTED Final    Comment: (NOTE) The GeneXpert MRSA Assay (FDA approved for NASAL specimens only), is one component of a  comprehensive MRSA colonization surveillance program. It is not intended to diagnose MRSA infection nor to guide or monitor treatment for MRSA infections. Test performance is not FDA approved in patients less than 51 years old. Performed at [facility], [address] [place], [place] [address]     Labs: CBC: Recent Labs  Lab 05/02/23 1008 05/03/23 0416 05/05/23 1402  WBC 5.9 5.4 4.9  NEUTROABS 4.3  --   --   HGB 10.8* 10.2* 11.3*  HCT 36.6 35.6* 37.3  MCV 97.6 97.3 94.2  PLT 180 189 217   Basic Metabolic Panel: Recent Labs  Lab 05/02/23 1008 05/03/23 0416 05/05/23 1402  NA 135 133* 131*  K 4.5 4.8 4.0  CL 97* 97* 94*  CO2 25 21* 23  GLUCOSE 105* 82 113*  BUN 56* 66* 58*  CREATININE 5.07* 5.65* 5.72*  CALCIUM  9.1 8.7* 8.7*  MG 2.2  --   --   PHOS 5.3*  --  6.3*   Liver Function Tests: Recent Labs  Lab 05/02/23 1008 05/05/23 1402  AST 14*  --   ALT 12  --   ALKPHOS 98  --   BILITOT 0.5  --   PROT 7.8  --   ALBUMIN 2.9* 2.7*   CBG: Recent Labs  Lab 05/02/23 1010  GLUCAP 97    Discharge time spent: greater than 30 minutes.  Signed: Vassie Loll, MD [facility] 05/05/2023

## 2023-05-05 NOTE — Progress Notes (Signed)
  [facility] Progress Note    Assessment/ Plan:   # Acute respiratory failure with hypoxia due to bilateral multilobar pneumonia: per primary- Currently on ceftriaxone and doxycycline.  Volume management with HD.   # ESRD: HD MWF.  -Outpatient orders: [facility].  MWF.  3.0 hours.  EDW 45 kg.  LUE AVF.  Flow rates: 400/500.  3K/2.5 calcium.  Heparin: None.  Meds: Mircera 200 mcg every 2 weeks (due this week), calcitriol 1 mcg every treatment -HD planned for today   # Hypertension: Continue current antihypertensives and manage UF with HD.   # Anemia of ESRD: Hemoglobin acceptable, will dose ESA once hemoglobin less than 10.  Transfuse as needed   # CKD-metabolic Bone Disease: Continue sevelamer and monitor calcium & phosphorus level. +renal diet  Subjective:   Patient seen and examined bedside. No acute events. She reports that her breathing is slightly better. No other complaints   Objective:   BP (!) 167/82 (BP Location: Right Arm)   Pulse 86   Temp 97.9 F (36.6 C) (Oral)   Resp 19   Ht 5\' 2"  (1.575 m)   Wt 45.7 kg   LMP  (LMP Unknown)   SpO2 96%   BMI 18.43 kg/m   Intake/Output Summary (Last 24 hours) at 05/05/2023 4098 Last data filed at 05/05/2023 0500 Gross per 24 hour  Intake 101.37 ml  Output 550 ml  Net -448.63 ml   Weight change:   Physical Exam: Gen: NAD, laying flat in bed CVS: RRR Resp: CTA B/L, no overt w/r/r/c Abd: ND Ext: no edema Neuro: awake, alert Dialysis access: LUE AVF +b/t  Imaging: No results found.  Labs: BMET Recent Labs  Lab 05/02/23 1008 05/03/23 0416  NA 135 133*  K 4.5 4.8  CL 97* 97*  CO2 25 21*  GLUCOSE 105* 82  BUN 56* 66*  CREATININE 5.07* 5.65*  CALCIUM 9.1 8.7*  PHOS 5.3*  --    CBC Recent Labs  Lab 05/02/23 1008 05/03/23 0416  WBC 5.9 5.4  NEUTROABS 4.3  --   HGB 10.8* 10.2*  HCT 36.6 35.6*  MCV 97.6 97.3  PLT 180 189    Medications:     amLODipine  5 mg Oral Daily    budesonide (PULMICORT) nebulizer solution  0.5 mg Nebulization BID   carvedilol  12.5 mg Oral BID WC   Chlorhexidine Gluconate Cloth  6 each Topical Q0600   Chlorhexidine Gluconate Cloth  6 each Topical Q0600   cloNIDine  0.1 mg Oral BID   dextromethorphan-guaiFENesin  1 tablet Oral BID   doxycycline  100 mg Oral Q12H   heparin injection (subcutaneous)  5,000 Units Subcutaneous Q8H   ipratropium-albuterol  3 mL Nebulization TID   levETIRAcetam  1,000 mg Oral Daily   levETIRAcetam  1,000 mg Oral Q M,W,F-HD   pantoprazole  40 mg Oral Daily   senna-docusate  2 tablet Oral QHS   sevelamer carbonate  800 mg Oral TID WC   sodium chloride flush  3 mL Intravenous Q12H      Anthony Sar, MD [facility] 05/05/2023, 7:13 AM

## 2023-05-05 NOTE — Consult Note (Addendum)
WOC Nurse Consult Note: Consult requested for pressure injuries.  Performed remotely after review of progress notes.   Pt is followed by the outpatient wound care center at [facility]. She was last seen at that site on 11/21.  They have ordered Aquacel to the locations. Pt is familiar to [facility] team from recent admission on 11/18.  Left ischium; Stage 4 Pressure Injury Right ischium; Stage 4 Pressure Injury; Pressure Injury POA: Yes Dressing procedure/placement/frequency: Continue plan of care as previously ordered by the outpatient wound care center with Alginate and foam dressing 3x wk. Topical treatment orders provided for beside nurses to perform:  Apply Aquacel [place] # [phone]) to left and right ischium wounds Q Mon/Wed/Fri.  Cover with foam dressing.  Moisten with NS to remove previous Aquacel Pt plans to discharge home today and should followup with the outpatient wound care center after discharge. Please re-consult if further assistance is needed.  Thank-you,  Cammie Mcgee MSN, RN, CWOCN, [place], CNS [phone]

## 2023-05-05 NOTE — Progress Notes (Signed)
Went over discharge instructions w/ pt and pt daughter Gust Brooms.

## 2023-05-05 NOTE — Progress Notes (Signed)
   HEMODIALYSIS TREATMENT NOTE:  Uneventful 3 hour heparin-free treatment completed using left upper arm AVF (15g/antegrade). Net UF 2.7 liters.  Pt is at EDW.  All blood was returned.  Post-HD:  05/05/23 1625  Vitals  Temp Source Oral  BP (!) 180/89  MAP (mmHg) 118  BP Location Right Arm  BP Method Automatic  Patient Position (if appropriate) Lying  Pulse Rate 89  Pulse Rate Source Monitor  Resp 18  Oxygen Therapy  SpO2 98 %  O2 Device Room Air  Post Treatment  Dialyzer Clearance Lightly streaked  Hemodialysis Intake (mL) 0 mL  Liters Processed 63.1  Fluid Removed (mL) 2700 mL  Tolerated HD Treatment Yes  Post-Hemodialysis Comments Pt at EDW  AVG/AVF Arterial Site Held (minutes) 5 minutes  AVG/AVF Venous Site Held (minutes) 5 minutes  Fistula / Graft Left Upper arm Arteriovenous fistula  No placement date or time found.   Placed prior to admission: Yes  Orientation: Left  Access Location: Upper arm  Access Type: Arteriovenous fistula  Fistula / Graft Assessment Thrill;Bruit  Status Patent    Arman Filter, RN [facility]

## 2023-05-07 LAB — CULTURE, BLOOD (ROUTINE X 2)
Culture: NO GROWTH
Culture: NO GROWTH
Special Requests: NORMAL
Special Requests: NORMAL

## 2023-05-13 ENCOUNTER — Emergency Department (HOSPITAL_COMMUNITY)
Admission: EM | Admit: 2023-05-13 | Discharge: 2023-05-14 | Disposition: A | Discharge status: Home / Self Care | Payer: Medicare Other | Attending: Emergency Medicine | Admitting: Emergency Medicine

## 2023-05-13 ENCOUNTER — Other Ambulatory Visit: Payer: Self-pay

## 2023-05-13 ENCOUNTER — Encounter (HOSPITAL_COMMUNITY): Payer: Self-pay | Admitting: *Deleted

## 2023-05-13 DIAGNOSIS — I10 Essential (primary) hypertension: Secondary | ICD-10-CM | POA: Insufficient documentation

## 2023-05-13 DIAGNOSIS — Z8542 Personal history of malignant neoplasm of other parts of uterus: Secondary | ICD-10-CM | POA: Diagnosis not present

## 2023-05-13 DIAGNOSIS — R509 Fever, unspecified: Secondary | ICD-10-CM | POA: Diagnosis not present

## 2023-05-13 DIAGNOSIS — R519 Headache, unspecified: Secondary | ICD-10-CM | POA: Diagnosis not present

## 2023-05-13 NOTE — ED Triage Notes (Signed)
Pt was told by her [facility] nurse to come to ED due to high BP  Pt has been under stress due to the loss of a family member  Pt does not remember what the reading was

## 2023-05-14 ENCOUNTER — Emergency Department (HOSPITAL_COMMUNITY): Payer: 59

## 2023-05-14 DIAGNOSIS — I10 Essential (primary) hypertension: Secondary | ICD-10-CM | POA: Diagnosis not present

## 2023-05-14 LAB — CBC
HCT: 39.2 % (ref 36.0–46.0)
Hemoglobin: 11.8 g/dL — ABNORMAL LOW (ref 12.0–15.0)
MCH: 28.5 pg (ref 26.0–34.0)
MCHC: 30.1 g/dL (ref 30.0–36.0)
MCV: 94.7 fL (ref 80.0–100.0)
Platelets: 160 10*3/uL (ref 150–400)
RBC: 4.14 MIL/uL (ref 3.87–5.11)
RDW: 17.8 % — ABNORMAL HIGH (ref 11.5–15.5)
WBC: 6 10*3/uL (ref 4.0–10.5)
nRBC: 0 % (ref 0.0–0.2)

## 2023-05-14 LAB — URINALYSIS, ROUTINE W REFLEX MICROSCOPIC
Bilirubin Urine: NEGATIVE
Glucose, UA: >=500 mg/dL — AB
Ketones, ur: NEGATIVE mg/dL
Nitrite: NEGATIVE
Protein, ur: >=300 mg/dL — AB
RBC / HPF: >50 RBC/hpf (ref 0–5)
Specific Gravity, Urine: 1.012 (ref 1.005–1.030)
Trans Epithel, UA: 16
WBC, UA: >50 WBC/hpf (ref 0–5)
pH: 8 (ref 5.0–8.0)

## 2023-05-14 LAB — COMPREHENSIVE METABOLIC PANEL
ALT: 11 U/L (ref 0–44)
AST: 16 U/L (ref 15–41)
Albumin: 2.9 g/dL — ABNORMAL LOW (ref 3.5–5.0)
Alkaline Phosphatase: 85 U/L (ref 38–126)
Anion gap: 13 (ref 5–15)
BUN: 59 mg/dL — ABNORMAL HIGH (ref 6–20)
CO2: 25 mmol/L (ref 22–32)
Calcium: 9.5 mg/dL (ref 8.9–10.3)
Chloride: 100 mmol/L (ref 98–111)
Creatinine, Ser: 5.31 mg/dL — ABNORMAL HIGH (ref 0.44–1.00)
GFR, Estimated: 9 mL/min — ABNORMAL LOW (ref >60)
Glucose, Bld: 111 mg/dL — ABNORMAL HIGH (ref 70–99)
Potassium: 5.3 mmol/L — ABNORMAL HIGH (ref 3.5–5.1)
Sodium: 138 mmol/L (ref 135–145)
Total Bilirubin: 0.6 mg/dL (ref <1.2)
Total Protein: 7.8 g/dL (ref 6.5–8.1)

## 2023-05-14 MED ORDER — HYDRALAZINE HCL 25 MG PO TABS
25.0000 mg | ORAL_TABLET | Freq: Once | ORAL | Status: AC
  Administered 2023-05-14: 25 mg via ORAL
  Filled 2023-05-14: qty 1

## 2023-05-14 MED ORDER — SODIUM ZIRCONIUM CYCLOSILICATE 5 G PO PACK
10.0000 g | PACK | Freq: Once | ORAL | Status: AC
  Administered 2023-05-14: 10 g via ORAL
  Filled 2023-05-14: qty 2

## 2023-05-14 MED ORDER — LABETALOL HCL 5 MG/ML IV SOLN
10.0000 mg | Freq: Once | INTRAVENOUS | Status: AC
  Administered 2023-05-14: 10 mg via INTRAVENOUS
  Filled 2023-05-14: qty 4

## 2023-05-14 NOTE — Discharge Instructions (Addendum)
You were evaluated in the Emergency Department and after careful evaluation, we did not find any emergent condition requiring admission or further testing in the hospital.  Your exam/testing today was overall reassuring.  Recommend close follow-up with your regular doctor to discuss your blood pressure.  Complete your antibiotics at home as prescribed.  Please return to the Emergency Department if you experience any worsening of your condition.  Thank you for allowing [place] to be a part of your care.

## 2023-05-14 NOTE — ED Provider Notes (Signed)
[facility]-EMERGENCY DEPT [facility] Emergency Department Provider Note MRN:  [id number]  Arrival date & time: 05/14/23     Chief Complaint   Hypertension   History of Present Illness   [name] is a 57 y.o. year-old female with a history of paraplegia, encephalomalacia, ESRD presenting to the ED with chief complaint of hypertension.  Elevated blood pressures for the past few days.  Reported fever yesterday.  Headache today but improved with Tylenol and food.  Brother passed away 2 days ago and so some increased stress and grief at home.  Denies chest pain or shortness of breath, no abdominal pain, no other complaints.  Review of Systems  A thorough review of systems was obtained and all systems are negative except as noted in the HPI and PMH.   Patient's Health History    Past Medical History:  Diagnosis Date   Abnormal uterine bleeding (AUB) 06/15/2014   Cancer (HCC)    uterine   High blood pressure    Paraplegia (lower)    Renal disorder    Seizure disorder (HCC)    Seizures (HCC)    Suprapubic catheter (HCC)    Urinary tract infection     Past Surgical History:  Procedure Laterality Date   APPLICATION OF WOUND VAC Right 09/13/2021   (approximately 1-60mos ago) pressure sore on right hip   BACK SURGERY     Pt stated "before 2000"   BIOPSY  12/03/2022   Procedure: BIOPSY;  Surgeon: Dolores Frame, MD;  Location: [facility] ENDO SU[facility];  Service: Gastroenterology;;   ESOPHAGOGASTRODUODENOSCOPY N/A 09/20/2015   Procedure: ESOPHAGOGASTRODUODENOSCOPY (EGD);  Surgeon: Malissa Hippo, MD;  Location: [facility];  Service: Endoscopy;  Laterality: N/A;  730   ESOPHAGOGASTRODUODENOSCOPY (EGD) WITH PROPOFOL N/A 12/03/2022   Procedure: ESOPHAGOGASTRODUODENOSCOPY (EGD) WITH PROPOFOL;  Surgeon: Dolores Frame, MD;  Location: [facility];  Service: Gastroenterology;  Laterality: N/A;  1:15 pm, asa 3, pt knows to arrive at 10:30  dialysis pt, M,W & F   IR  CATHETER TUBE CHANGE  04/02/2018   PERCUTANEOUS ENDOSCOPIC GASTROSTOMY (PEG) REMOVAL N/A 09/20/2015   Procedure: PERCUTANEOUS ENDOSCOPIC GASTROSTOMY (PEG) REMOVAL;  Surgeon: Malissa Hippo, MD;  Location: [facility];  Service: Endoscopy;  Laterality: N/A;   TEE WITHOUT CARDIOVERSION N/A 11/11/2022   Procedure: TRANSESOPHAGEAL ECHOCARDIOGRAM (TEE);  Surgeon: Pricilla Riffle, MD;  Location: [facility] ORS;  Service: Cardiovascular;  Laterality: N/A;    Family History  Problem Relation Age of Onset   Cancer Mother    Hypertension Mother    Cancer Sister        breast and then spread everywhere.   Diabetes Paternal Grandmother    Hypertension Paternal Grandmother     Social History   Socioeconomic History   Marital status: Single    Spouse name: Not on file   Number of children: 3   Years of education: 9 th   Highest education level: Not on file  Occupational History    Comment: Disabled  Tobacco Use   Smoking status: Never   Smokeless tobacco: Never  Vaping Use   Vaping status: Never Used  Substance and Sexual Activity   Alcohol use: Yes    Comment: Occassionally   Drug use: No   Sexual activity: Never  Other Topics Concern   Not on file  Social History Narrative   Patient lives with her son Vicente Serene). Patient is disabled.   Education 9th grade.   Right handed.   Caffeine -  None    Social Drivers of Corporate investment banker Strain: Not on file  Food Insecurity: No Food Insecurity (05/02/2023)   Hunger Vital Sign    Worried About Running Out of Food in the Last Year: Never true    Ran Out of Food in the Last Year: Never true  Transportation Needs: No Transportation Needs (05/02/2023)   PRAPARE - Administrator, Civil Service (Medical): No    Lack of Transportation (Non-Medical): No  Physical Activity: Not on file  Stress: Not on file  Social Connections: Not on file  Intimate Partner Violence: Not At Risk (05/02/2023)   Humiliation, Afraid, Rape, and Kick  questionnaire    Fear of Current or Ex-Partner: No    Emotionally Abused: No    Physically Abused: No    Sexually Abused: No     Physical Exam   Vitals:   05/14/23 0200 05/14/23 0207  BP: (!) 194/97 (!) 164/86  Pulse: 82 93  Resp: (!) 25 (!) 25  Temp:    SpO2: 96% 97%    CONSTITUTIONAL: Chronically ill-appearing, NAD NEURO/PSYCH:  Alert and oriented x 3, paraplegic EYES:  eyes equal and reactive ENT/NECK:  no LAD, no JVD CARDIO: Regular rate, well-perfused, normal S1 and S2 PULM:  CTAB no wheezing or rhonchi GI/GU:  non-distended, non-tender MSK/SPINE:  No gross deformities, no edema SKIN:  no rash, atraumatic   *Additional and/or pertinent findings included in MDM below  Diagnostic and Interventional Summary    EKG Interpretation Date/Time:  Wednesday May 14 2023 00:42:32 EST Ventricular Rate:  95 PR Interval:  130 QRS Duration:  112 QT Interval:  397 QTC Calculation: 500 R Axis:   -70  Text Interpretation: Sinus rhythm Probable left atrial enlargement Incomplete RBBB and LAFB Consider anterior infarct Minimal ST elevation, lateral leads Confirmed by Kennis Carina [phone]) on 05/14/2023 1:23:46 AM       Labs Reviewed  CBC - Abnormal; Notable for the following components:      Result Value   Hemoglobin 11.8 (*)    RDW 17.8 (*)    All other components within normal limits  COMPREHENSIVE METABOLIC PANEL - Abnormal; Notable for the following components:   Potassium 5.3 (*)    Glucose, Bld 111 (*)    BUN 59 (*)    Creatinine, Ser 5.31 (*)    Albumin 2.9 (*)    GFR, Estimated 9 (*)    All other components within normal limits  URINALYSIS, ROUTINE W REFLEX MICROSCOPIC - Abnormal; Notable for the following components:   APPearance CLOUDY (*)    Glucose, UA >=500 (*)    Hgb urine dipstick SMALL (*)    Protein, ur >=300 (*)    Leukocytes,Ua LARGE (*)    Bacteria, UA RARE (*)    All other components within normal limits    DG Chest Port 1 View  Final  Result    CT HEAD WO CONTRAST ( )  Final Result      Medications  hydrALAZINE (APRESOLINE) tablet 25 mg (25 mg Oral Given 05/14/23 0036)  labetalol (NORMODYNE) injection 10 mg (10 mg Intravenous Given 05/14/23 0201)  sodium zirconium cyclosilicate (LOKELMA) packet 10 g (10 g Oral Given 05/14/23 0201)     Procedures  /  Critical Care Procedures  ED Course and Medical Decision Making  Initial Impression and Ddx Main complaint of hypertension, having a headache but no vision change, no chest pain, no shortness of breath.  Reported fever yesterday.  High blood pressure could be related to some type of infectious process.  Given patient's paraplegia she is at increased risk for significant bacterial infection such as pneumonia, UTI.  She has some mild decubitus wounds per family at bedside however they were checked by home health earlier today and the daughter confirms that they looked really good.  Will initiate infectious workup as well as obtain CT head to exclude hypertensive bleed.  Past medical/surgical history that increases complexity of ED encounter: Paraplegia  Interpretation of Diagnostics I personally reviewed the EKG and my interpretation is as follows: Sinus rhythm  Labs without significant blood count or electrolyte disturbance.  Chest x-ray question atelectasis versus pneumonia, favoring atelectasis given that patient recently recovered from a pneumonia and she is currently taking antibiotics and she is not having any fever or cough and there is no leukocytosis.  Patient Reassessment and Ultimate Disposition/Management     Patient's blood pressure is improved, continues to have minimal symptoms.  CT head without acute process.  Appropriate for discharge.  Patient management required discussion with the following services or consulting groups:  None  Complexity of Problems Addressed Acute illness or injury that poses threat of life of bodily function  Additional Data  Reviewed and Analyzed Further history obtained from: Further history from spouse/family member  Additional Factors Impacting ED Encounter Risk Consideration of hospitalization  Elmer Sow. Pilar Plate, MD [facility] Emergency [facility] [email or web address]  Final Clinical Impressions(s) / ED Diagnoses     ICD-10-CM   1. Hypertension, unspecified type  I10       ED Discharge Orders     None        Discharge Instructions Discussed with and Provided to Patient:     Discharge Instructions      You were evaluated in the Emergency Department and after careful evaluation, we did not find any emergent condition requiring admission or further testing in the hospital.  Your exam/testing today was overall reassuring.  Recommend close follow-up with your regular doctor to discuss your blood pressure.  Complete your antibiotics at home as prescribed.  Please return to the Emergency Department if you experience any worsening of your condition.  Thank you for allowing [place] to be a part of your care.        Sabas Sous, MD 05/14/23 [phone]

## 2023-05-20 ENCOUNTER — Other Ambulatory Visit: Payer: Self-pay

## 2023-05-20 ENCOUNTER — Emergency Department (HOSPITAL_COMMUNITY): Payer: 59

## 2023-05-20 ENCOUNTER — Encounter (HOSPITAL_COMMUNITY): Payer: Self-pay

## 2023-05-20 ENCOUNTER — Inpatient Hospital Stay (HOSPITAL_COMMUNITY)
Admission: EM | Admit: 2023-05-20 | Discharge: 2023-05-24 | DRG: 640 | Disposition: A | Discharge status: Home Health | Payer: 59 | Attending: Internal Medicine | Admitting: Internal Medicine

## 2023-05-20 DIAGNOSIS — L89304 Pressure ulcer of unspecified buttock, stage 4: Secondary | ICD-10-CM | POA: Diagnosis not present

## 2023-05-20 DIAGNOSIS — E8770 Fluid overload, unspecified: Secondary | ICD-10-CM | POA: Diagnosis not present

## 2023-05-20 DIAGNOSIS — Z992 Dependence on renal dialysis: Secondary | ICD-10-CM | POA: Diagnosis not present

## 2023-05-20 DIAGNOSIS — M549 Dorsalgia, unspecified: Secondary | ICD-10-CM | POA: Diagnosis present

## 2023-05-20 DIAGNOSIS — Z936 Other artificial openings of urinary tract status: Secondary | ICD-10-CM | POA: Diagnosis not present

## 2023-05-20 DIAGNOSIS — D631 Anemia in chronic kidney disease: Secondary | ICD-10-CM | POA: Diagnosis present

## 2023-05-20 DIAGNOSIS — M898X9 Other specified disorders of bone, unspecified site: Secondary | ICD-10-CM | POA: Diagnosis present

## 2023-05-20 DIAGNOSIS — I5042 Chronic combined systolic (congestive) and diastolic (congestive) heart failure: Secondary | ICD-10-CM | POA: Diagnosis present

## 2023-05-20 DIAGNOSIS — L89214 Pressure ulcer of right hip, stage 4: Secondary | ICD-10-CM | POA: Diagnosis present

## 2023-05-20 DIAGNOSIS — E8779 Other fluid overload: Principal | ICD-10-CM | POA: Diagnosis present

## 2023-05-20 DIAGNOSIS — J9601 Acute respiratory failure with hypoxia: Principal | ICD-10-CM | POA: Diagnosis present

## 2023-05-20 DIAGNOSIS — Z833 Family history of diabetes mellitus: Secondary | ICD-10-CM

## 2023-05-20 DIAGNOSIS — G8220 Paraplegia, unspecified: Secondary | ICD-10-CM | POA: Diagnosis present

## 2023-05-20 DIAGNOSIS — N186 End stage renal disease: Secondary | ICD-10-CM | POA: Diagnosis present

## 2023-05-20 DIAGNOSIS — I10 Essential (primary) hypertension: Secondary | ICD-10-CM | POA: Diagnosis not present

## 2023-05-20 DIAGNOSIS — L89309 Pressure ulcer of unspecified buttock, unspecified stage: Secondary | ICD-10-CM | POA: Diagnosis present

## 2023-05-20 DIAGNOSIS — L89224 Pressure ulcer of left hip, stage 4: Secondary | ICD-10-CM | POA: Diagnosis present

## 2023-05-20 DIAGNOSIS — R0602 Shortness of breath: Secondary | ICD-10-CM | POA: Diagnosis present

## 2023-05-20 DIAGNOSIS — I161 Hypertensive emergency: Secondary | ICD-10-CM | POA: Diagnosis present

## 2023-05-20 DIAGNOSIS — G8929 Other chronic pain: Secondary | ICD-10-CM | POA: Diagnosis present

## 2023-05-20 DIAGNOSIS — Z91158 Patient's noncompliance with renal dialysis for other reason: Secondary | ICD-10-CM | POA: Diagnosis not present

## 2023-05-20 DIAGNOSIS — Z8542 Personal history of malignant neoplasm of other parts of uterus: Secondary | ICD-10-CM

## 2023-05-20 DIAGNOSIS — Z803 Family history of malignant neoplasm of breast: Secondary | ICD-10-CM

## 2023-05-20 DIAGNOSIS — Z9359 Other cystostomy status: Secondary | ICD-10-CM

## 2023-05-20 DIAGNOSIS — A481 Legionnaires' disease: Secondary | ICD-10-CM | POA: Diagnosis present

## 2023-05-20 DIAGNOSIS — G40909 Epilepsy, unspecified, not intractable, without status epilepticus: Secondary | ICD-10-CM

## 2023-05-20 DIAGNOSIS — R519 Headache, unspecified: Secondary | ICD-10-CM | POA: Diagnosis present

## 2023-05-20 DIAGNOSIS — Z8701 Personal history of pneumonia (recurrent): Secondary | ICD-10-CM

## 2023-05-20 DIAGNOSIS — Z881 Allergy status to other antibiotic agents status: Secondary | ICD-10-CM

## 2023-05-20 DIAGNOSIS — Z8249 Family history of ischemic heart disease and other diseases of the circulatory system: Secondary | ICD-10-CM | POA: Diagnosis not present

## 2023-05-20 DIAGNOSIS — Z79899 Other long term (current) drug therapy: Secondary | ICD-10-CM

## 2023-05-20 DIAGNOSIS — I132 Hypertensive heart and chronic kidney disease with heart failure and with stage 5 chronic kidney disease, or end stage renal disease: Secondary | ICD-10-CM | POA: Diagnosis present

## 2023-05-20 DIAGNOSIS — R0600 Dyspnea, unspecified: Secondary | ICD-10-CM | POA: Diagnosis present

## 2023-05-20 LAB — PROCALCITONIN: Procalcitonin: 0.54 ng/mL

## 2023-05-20 LAB — COMPREHENSIVE METABOLIC PANEL
ALT: 13 U/L (ref 0–44)
AST: 17 U/L (ref 15–41)
Albumin: 3.2 g/dL — ABNORMAL LOW (ref 3.5–5.0)
Alkaline Phosphatase: 90 U/L (ref 38–126)
Anion gap: 12 (ref 5–15)
BUN: 50 mg/dL — ABNORMAL HIGH (ref 6–20)
CO2: 24 mmol/L (ref 22–32)
Calcium: 8.7 mg/dL — ABNORMAL LOW (ref 8.9–10.3)
Chloride: 101 mmol/L (ref 98–111)
Creatinine, Ser: 5.34 mg/dL — ABNORMAL HIGH (ref 0.44–1.00)
GFR, Estimated: 9 mL/min — ABNORMAL LOW (ref >60)
Glucose, Bld: 94 mg/dL (ref 70–99)
Potassium: 4.3 mmol/L (ref 3.5–5.1)
Sodium: 137 mmol/L (ref 135–145)
Total Bilirubin: 0.9 mg/dL (ref <1.2)
Total Protein: 8.1 g/dL (ref 6.5–8.1)

## 2023-05-20 LAB — HEPATITIS B SURFACE ANTIGEN: Hepatitis B Surface Ag: NONREACTIVE

## 2023-05-20 LAB — CBC WITH DIFFERENTIAL/PLATELET
Abs Immature Granulocytes: 0.02 10*3/uL (ref 0.00–0.07)
Basophils Absolute: 0.1 10*3/uL (ref 0.0–0.1)
Basophils Relative: 1 %
Eosinophils Absolute: 0.1 10*3/uL (ref 0.0–0.5)
Eosinophils Relative: 2 %
HCT: 40.6 % (ref 36.0–46.0)
Hemoglobin: 12.1 g/dL (ref 12.0–15.0)
Immature Granulocytes: 0 %
Lymphocytes Relative: 18 %
Lymphs Abs: 1.3 10*3/uL (ref 0.7–4.0)
MCH: 28.3 pg (ref 26.0–34.0)
MCHC: 29.8 g/dL — ABNORMAL LOW (ref 30.0–36.0)
MCV: 95.1 fL (ref 80.0–100.0)
Monocytes Absolute: 0.5 10*3/uL (ref 0.1–1.0)
Monocytes Relative: 6 %
Neutro Abs: 5.5 10*3/uL (ref 1.7–7.7)
Neutrophils Relative %: 73 %
Platelets: 178 10*3/uL (ref 150–400)
RBC: 4.27 MIL/uL (ref 3.87–5.11)
RDW: 19 % — ABNORMAL HIGH (ref 11.5–15.5)
WBC: 7.5 10*3/uL (ref 4.0–10.5)
nRBC: 0 % (ref 0.0–0.2)

## 2023-05-20 LAB — BRAIN NATRIURETIC PEPTIDE: B Natriuretic Peptide: >4500 pg/mL — ABNORMAL HIGH (ref 0.0–100.0)

## 2023-05-20 MED ORDER — ONDANSETRON HCL 4 MG/2ML IJ SOLN: 4.0000 mg | Freq: Four times a day (QID) | INTRAMUSCULAR | Status: DC | PRN

## 2023-05-20 MED ORDER — HYDROCODONE-ACETAMINOPHEN 5-325 MG PO TABS
1.0000 | ORAL_TABLET | Freq: Four times a day (QID) | ORAL | Status: DC | PRN
  Administered 2023-05-20 – 2023-05-24 (×8): 1 via ORAL
  Filled 2023-05-20 (×9): qty 1

## 2023-05-20 MED ORDER — LEVOFLOXACIN IN D5W 750 MG/150ML IV SOLN
750.0000 mg | Freq: Once | INTRAVENOUS | Status: AC
  Administered 2023-05-20: 750 mg via INTRAVENOUS
  Filled 2023-05-20: qty 150

## 2023-05-20 MED ORDER — PENTAFLUOROPROP-TETRAFLUOROETH EX AERO: 1.0000 | INHALATION_SPRAY | CUTANEOUS | Status: DC | PRN

## 2023-05-20 MED ORDER — LIDOCAINE HCL (PF) 1 % IJ SOLN: 5.0000 mL | INTRAMUSCULAR | Status: DC | PRN

## 2023-05-20 MED ORDER — CHLORHEXIDINE GLUCONATE CLOTH 2 % EX PADS
6.0000 | MEDICATED_PAD | Freq: Every day | CUTANEOUS | Status: DC
Start: 2023-05-21 — End: 2023-05-22

## 2023-05-20 MED ORDER — SODIUM CHLORIDE 0.9 % IV SOLN
2.0000 g | Freq: Once | INTRAVENOUS | Status: AC
  Administered 2023-05-20: 2 g via INTRAVENOUS
  Filled 2023-05-20: qty 20

## 2023-05-20 MED ORDER — HEPARIN SODIUM (PORCINE) 5000 UNIT/ML IJ SOLN
5000.0000 [IU] | Freq: Three times a day (TID) | INTRAMUSCULAR | Status: DC
  Administered 2023-05-20 – 2023-05-24 (×11): 5000 [IU] via SUBCUTANEOUS
  Filled 2023-05-20 (×11): qty 1

## 2023-05-20 MED ORDER — ONDANSETRON HCL 4 MG PO TABS: 4.0000 mg | ORAL_TABLET | Freq: Four times a day (QID) | ORAL | Status: DC | PRN

## 2023-05-20 MED ORDER — PANTOPRAZOLE SODIUM 40 MG PO TBEC
40.0000 mg | DELAYED_RELEASE_TABLET | Freq: Every day | ORAL | Status: DC
  Administered 2023-05-21 – 2023-05-24 (×4): 40 mg via ORAL
  Filled 2023-05-20 (×4): qty 1

## 2023-05-20 MED ORDER — LEVETIRACETAM 500 MG PO TABS
1000.0000 mg | ORAL_TABLET | Freq: Every evening | ORAL | Status: DC
  Administered 2023-05-20 – 2023-05-23 (×4): 1000 mg via ORAL
  Filled 2023-05-20 (×4): qty 2

## 2023-05-20 MED ORDER — LEVOFLOXACIN IN D5W 500 MG/100ML IV SOLN
500.0000 mg | INTRAVENOUS | Status: DC
  Administered 2023-05-22: 500 mg via INTRAVENOUS
  Filled 2023-05-20: qty 100

## 2023-05-20 MED ORDER — DOXYCYCLINE HYCLATE 100 MG PO TABS
100.0000 mg | ORAL_TABLET | Freq: Once | ORAL | Status: AC
  Administered 2023-05-20: 100 mg via ORAL
  Filled 2023-05-20: qty 1

## 2023-05-20 MED ORDER — LIDOCAINE-PRILOCAINE 2.5-2.5 % EX CREA: 1.0000 | TOPICAL_CREAM | CUTANEOUS | Status: DC | PRN

## 2023-05-20 MED ORDER — SEVELAMER CARBONATE 800 MG PO TABS
800.0000 mg | ORAL_TABLET | Freq: Three times a day (TID) | ORAL | Status: DC
  Administered 2023-05-21 – 2023-05-24 (×10): 800 mg via ORAL
  Filled 2023-05-20 (×10): qty 1

## 2023-05-20 MED ORDER — CYCLOBENZAPRINE HCL 10 MG PO TABS
5.0000 mg | ORAL_TABLET | Freq: Three times a day (TID) | ORAL | Status: DC | PRN
  Administered 2023-05-21 – 2023-05-22 (×2): 5 mg via ORAL
  Filled 2023-05-20 (×2): qty 1

## 2023-05-20 MED ORDER — LABETALOL HCL 5 MG/ML IV SOLN
10.0000 mg | INTRAVENOUS | Status: DC | PRN
  Administered 2023-05-20 – 2023-05-22 (×2): 10 mg via INTRAVENOUS
  Filled 2023-05-20: qty 4

## 2023-05-20 MED ORDER — ALBUTEROL SULFATE (2.5 MG/3ML) 0.083% IN NEBU: 2.5000 mg | INHALATION_SOLUTION | RESPIRATORY_TRACT | Status: DC | PRN

## 2023-05-20 MED ORDER — CLONIDINE HCL 0.1 MG PO TABS
0.1000 mg | ORAL_TABLET | Freq: Two times a day (BID) | ORAL | Status: DC
  Administered 2023-05-20 – 2023-05-24 (×8): 0.1 mg via ORAL
  Filled 2023-05-20 (×7): qty 1

## 2023-05-20 MED ORDER — SENNOSIDES-DOCUSATE SODIUM 8.6-50 MG PO TABS
2.0000 | ORAL_TABLET | Freq: Every day | ORAL | Status: DC
  Administered 2023-05-20 – 2023-05-23 (×3): 2 via ORAL
  Filled 2023-05-20 (×4): qty 2

## 2023-05-20 MED ORDER — AMLODIPINE BESYLATE 5 MG PO TABS
10.0000 mg | ORAL_TABLET | ORAL | Status: DC
  Administered 2023-05-20 – 2023-05-24 (×3): 10 mg via ORAL
  Filled 2023-05-20 (×3): qty 2

## 2023-05-20 MED ORDER — CARVEDILOL 12.5 MG PO TABS
12.5000 mg | ORAL_TABLET | Freq: Two times a day (BID) | ORAL | Status: DC
  Administered 2023-05-21 – 2023-05-24 (×7): 12.5 mg via ORAL
  Filled 2023-05-20 (×7): qty 1

## 2023-05-20 MED ORDER — POLYETHYLENE GLYCOL 3350 17 G PO PACK: 17.0000 g | PACK | Freq: Every day | ORAL | Status: DC | PRN

## 2023-05-20 MED ORDER — LABETALOL HCL 5 MG/ML IV SOLN
INTRAVENOUS | Status: AC
  Filled 2023-05-20: qty 4

## 2023-05-20 MED ORDER — ACETAMINOPHEN 650 MG RE SUPP: 650.0000 mg | Freq: Four times a day (QID) | RECTAL | Status: DC | PRN

## 2023-05-20 MED ORDER — AMLODIPINE BESYLATE 5 MG PO TABS
5.0000 mg | ORAL_TABLET | ORAL | Status: DC
  Administered 2023-05-21 – 2023-05-23 (×2): 5 mg via ORAL
  Filled 2023-05-20 (×2): qty 1

## 2023-05-20 MED ORDER — ACETAMINOPHEN 325 MG PO TABS: 650.0000 mg | ORAL_TABLET | Freq: Four times a day (QID) | ORAL | Status: DC | PRN

## 2023-05-20 NOTE — ED Triage Notes (Signed)
Pt reports:  Elevated BP 205/115 Headache  Started last week Back pain Chronic Worsening in the mid section

## 2023-05-20 NOTE — ED Provider Notes (Signed)
[facility] AT [facility] Provider Note  CSN: [id number] Arrival date & time: 05/20/23 1404  Chief Complaint(s) Hypertension, Headache, and Back Pain  HPI Rylan Climer is a 57 y.o. female history of paraplegia, suprapubic catheter, end-stage renal disease on dialysis, recent admission for pneumonia presenting to the emergency department with shortness of breath.  Patient reports shortness of breath today.  She did go to dialysis yesterday.  She reports that she has had high blood pressure at home.  Did miss 1 session last week.  She recently completed antibiotics for her pneumonia.  She denies any ongoing cough or fevers.  Has had some occasional chills.  She reports that she has had some intermittent headaches, worse today.  She also reports chronic back pain which is unchanged.  No abdominal pain, nausea or vomiting.  No chest pain.  Reports unchanged urine output from her suprapubic catheter.  No leg swelling.  No numbness or tingling, weakness.  No visual changes.   Past Medical History Past Medical History:  Diagnosis Date   Abnormal uterine bleeding (AUB) 06/15/2014   Cancer (HCC)    uterine   High blood pressure    Paraplegia (lower)    Renal disorder    Seizure disorder (HCC)    Seizures (HCC)    Suprapubic catheter (HCC)    Urinary tract infection    Patient Active Problem List   Diagnosis Date Noted   Volume overload 05/20/2023   Legionella pneumonia (HCC) 05/05/2023   Acute respiratory failure with hypoxia (HCC) 05/02/2023   Pulmonary edema 04/13/2023   Diarrhea 04/13/2023   Respiratory failure with hypoxia (HCC) 04/01/2023   Dyspnea and respiratory abnormalities 03/17/2023   Dyspnea 03/05/2023   Retroperitoneal hematoma 12/31/2022   Subcutaneous hematoma 12/31/2022   Hip osteomyelitis, right (HCC) 12/18/2022   Sepsis due to undetermined organism (HCC) 12/18/2022   Chronic pain 12/14/2022   Bacteremia 11/08/2022   Loss of weight  10/26/2022   N&V (nausea and vomiting) 10/26/2022   Cellulitis of knee, left 07/24/2022   Infected decubitus ulcer 07/23/2022   Osteomyelitis of multiple sites-chronic     Chronic anemia 02/25/2022   ESRD (end stage renal disease) (HCC) 02/25/2022   Abscess of buttock, right 08/25/2021   GERD (gastroesophageal reflux disease) 08/25/2021   Anxiety 08/25/2021   Decubitus ulcers 08/25/2021   Anemia 08/24/2021   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 chronic kidney disease (HCC) 04/25/2017   AKI (acute kidney injury) (HCC) 04/25/2017   Dehydration 04/25/2017   Metabolic acidosis 04/25/2017   Altered mental status 06/22/2016   Altered mental state 08/16/2015   Pressure ulcer 05/16/2015   Acute encephalopathy 05/15/2015   Malnutrition (HCC)    Encephalopathy acute 10/10/2014   Sepsis (HCC) 10/10/2014   PNA (pneumonia) 10/10/2014   Encephalomalacia 10/10/2014   TBI (traumatic brain injury) (HCC) 10/10/2014   Severe protein-calorie malnutrition (HCC) 10/10/2014   UTI (urinary tract infection) 08/26/2014   Hypokalemia 08/26/2014   Abnormal uterine bleeding (AUB) 06/15/2014   Essential hypertension    Seizure disorder (HCC)    Status epilepticus (HCC) 05/02/2014   Hypertensive emergency without congestive heart failure 05/02/2014   Acute renal failure (HCC) 05/21/2013   Hyperkalemia 05/21/2013   Hypothermia 05/21/2013   Paraplegia (HCC) 06/25/2012   Suprapubic catheter (HCC) 06/25/2012   Decubitus ulcer of buttock 06/25/2012   Home Medication(s) Prior to Admission medications   Medication Sig Start Date End Date Taking? Authorizing Provider  acetaminophen (TYLENOL) 500 MG tablet Take 1,000 mg  by mouth every 6 (six) hours as needed for moderate pain.    [provider]  albuterol (PROVENTIL) (2.5 MG/3ML) 0.083% nebulizer solution Take 3 mLs (2.5 mg total) by nebulization every 4 (four) hours as needed for wheezing or shortness of breath. 04/15/23 04/14/24  Shon Hale, MD  amLODipine  (NORVASC) 5 MG tablet Take 1 tablet (5 mg total) by mouth daily. Take 1 tablet on Monday,Wednesday and Friday then take 2 tablets on Tuesday,Thursday,Saturday and Sunday. 04/15/23   Shon Hale, MD  carvedilol (COREG) 12.5 MG tablet Take 1 tablet (12.5 mg total) by mouth 2 (two) times daily with a meal. 04/15/23 08/03/23  Shon Hale, MD  cloNIDine (CATAPRES) 0.1 MG tablet Take 1 tablet (0.1 mg total) by mouth 2 (two) times daily. 04/15/23   Shon Hale, MD  cyclobenzaprine (FLEXERIL) 5 MG tablet Take 5 mg by mouth 3 (three) times daily as needed for muscle spasms. 04/11/23   [provider]  levETIRAcetam (KEPPRA) 500 MG tablet Take 2 tablets (1,000 mg total) by mouth every evening. Take an additional tablet on Monday,Wednesday and Friday evening after Dialysis 04/15/23   Shon Hale, MD  lidocaine-prilocaine (EMLA) cream Apply 1 Application topically 3 (three) times a week. 06/04/22   [provider]  naloxone [facility]) nasal spray 4 mg/0.1 mL Place 0.4 mg into the nose once. 10/03/22   [provider]  Nebulizers (COMPRESSOR/NEBULIZER) MISC 1 Units by Does not apply route as needed. 04/15/23   Shon Hale, MD  pantoprazole (PROTONIX) 40 MG tablet Take 40 mg by mouth daily. 08/03/22   [provider]  senna-docusate (SENOKOT-S) 8.6-50 MG tablet Take 2 tablets by mouth at bedtime. 11/12/22   Shon Hale, MD  sevelamer carbonate (RENVELA) 800 MG tablet Take 800 mg by mouth 3 (three) times daily. 12/11/22   [provider]                                                                                                                                    Past Surgical History Past Surgical History:  Procedure Laterality Date   APPLICATION OF WOUND VAC Right 09/13/2021   (approximately 1-65mos ago) pressure sore on right hip   BACK SURGERY     Pt stated "before 2000"   BIOPSY  12/03/2022   Procedure: BIOPSY;  Surgeon: Dolores Frame, MD;  Location: [facility];  Service: Gastroenterology;;   ESOPHAGOGASTRODUODENOSCOPY N/A 09/20/2015   Procedure: ESOPHAGOGASTRODUODENOSCOPY (EGD);  Surgeon: Malissa Hippo, MD;  Location: [facility];  Service: Endoscopy;  Laterality: N/A;  730   ESOPHAGOGASTRODUODENOSCOPY (EGD) WITH PROPOFOL N/A 12/03/2022   Procedure: ESOPHAGOGASTRODUODENOSCOPY (EGD) WITH PROPOFOL;  Surgeon: Dolores Frame, MD;  Location: [facility];  Service: Gastroenterology;  Laterality: N/A;  1:15 pm, asa 3, pt knows to arrive at 10:30  dialysis pt, M,W & F   IR CATHETER TUBE CHANGE  04/02/2018   PERCUTANEOUS  ENDOSCOPIC GASTROSTOMY (PEG) REMOVAL N/A 09/20/2015   Procedure: PERCUTANEOUS ENDOSCOPIC GASTROSTOMY (PEG) REMOVAL;  Surgeon: Malissa Hippo, MD;  Location: [facility];  Service: Endoscopy;  Laterality: N/A;   TEE WITHOUT CARDIOVERSION N/A 11/11/2022   Procedure: TRANSESOPHAGEAL ECHOCARDIOGRAM (TEE);  Surgeon: Pricilla Riffle, MD;  Location: [facility];  Service: Cardiovascular;  Laterality: N/A;   Family History Family History  Problem Relation Age of Onset   Cancer Mother    Hypertension Mother    Cancer Sister        breast and then spread everywhere.   Diabetes Paternal Grandmother    Hypertension Paternal Grandmother     Social History Social History   Tobacco Use   Smoking status: Never   Smokeless tobacco: Never  Vaping Use   Vaping status: Never Used  Substance Use Topics   Alcohol use: Yes    Comment: Occassionally   Drug use: No   Allergies Benadryl [diphenhydramine hcl (sleep)], Daptomycin, Linezolid, Moxifloxacin, Quinine derivatives, Vancomycin, Azithromycin, Tetracycline, and Zosyn [piperacillin sod-tazobactam so]  Review of Systems Review of Systems  All other systems reviewed and are negative.   Physical Exam Vital Signs  I have reviewed the triage vital signs BP (!) 184/97   Pulse 82   Temp 98.6 F (37 C) (Oral)   Resp 18   Ht 5\' 2"  (1.575 m)    Wt 44.5 kg   LMP  (LMP Unknown)   SpO2 97%   BMI 17.92 kg/m  Physical Exam Vitals and nursing note reviewed.  Constitutional:      General: She is not in acute distress.    Appearance: She is well-developed.  HENT:     Head: Normocephalic and atraumatic.     Mouth/Throat:     Mouth: Mucous membranes are moist.  Eyes:     Pupils: Pupils are equal, round, and reactive to light.  Cardiovascular:     Rate and Rhythm: Normal rate and regular rhythm.     Heart sounds: No murmur heard. Pulmonary:     Comments: Mild tachypnea with increased work of breathing, bibasilar crackles bilaterally, diminished right basilar breath sounds Abdominal:     General: Abdomen is flat.     Palpations: Abdomen is soft.     Tenderness: There is no abdominal tenderness.  Musculoskeletal:        General: No tenderness.     Right lower leg: No edema.     Left lower leg: No edema.  Skin:    General: Skin is warm and dry.  Neurological:     General: No focal deficit present.     Mental Status: She is alert. Mental status is at baseline.  Psychiatric:        Mood and Affect: Mood normal.        Behavior: Behavior normal.     ED Results and Treatments Labs (all labs ordered are listed, but only abnormal results are displayed) Labs Reviewed  COMPREHENSIVE METABOLIC PANEL - Abnormal; Notable for the following components:      Result Value   BUN 50 (*)    Creatinine, Ser 5.34 (*)    Calcium 8.7 (*)    Albumin 3.2 (*)    GFR, Estimated 9 (*)    All other components within normal limits  CBC WITH DIFFERENTIAL/PLATELET - Abnormal; Notable for the following components:   MCHC 29.8 (*)    RDW 19.0 (*)    All other components within normal limits  BRAIN NATRIURETIC PEPTIDE -  Abnormal; Notable for the following components:   B Natriuretic Peptide >4,500.0 (*)    All other components within normal limits  CULTURE, BLOOD (ROUTINE X 2)  CULTURE, BLOOD (ROUTINE X 2)  PROCALCITONIN  LEGIONELLA  PNEUMOPHILA SEROGP 1 UR AG  HEPATITIS B SURFACE ANTIGEN  HEPATITIS B SURFACE ANTIBODY, QUANTITATIVE                                                                                                                          Radiology CT Chest Wo Contrast Result Date: 05/20/2023 CLINICAL DATA:  Respiratory distress. EXAM: CT CHEST WITHOUT CONTRAST TECHNIQUE: Multidetector CT imaging of the chest was performed following the standard protocol without IV contrast. RADIATION DOSE REDUCTION: This exam was performed according to the departmental dose-optimization program which includes automated exposure control, adjustment of the mA and/or kV according to patient size and/or use of iterative reconstruction technique. COMPARISON:  Chest radiograph dated 05/20/2023. chest CT dated 03/05/2023. FINDINGS: Evaluation of this exam is limited in the absence of intravenous contrast and streak artifact caused by spinal hardware. Cardiovascular: There is moderate cardiomegaly. Small pericardial effusion. There is mild atherosclerotic calcification of the thoracic aorta. No aneurysmal dilatation. The central pulmonary arteries are grossly unremarkable on this noncontrast CT. Mediastinum/Nodes: No obvious hilar or mediastinal adenopathy. Evaluation however is limited due to factors above. The esophagus is grossly unremarkable. Lungs/Pleura: There is near complete consolidation of the right lower lobe with air bronchogram and volume loss. Bilateral pleural effusions, right greater than left, and similar to prior CT. There are bibasilar atelectasis. No pneumothorax. The central airways remain patent. Upper Abdomen: No acute abnormality. Musculoskeletal: There is diffuse subcutaneous edema and anasarca. Osteopenia with degenerative changes and posterior spinal fusion hardware. No acute osseous pathology. IMPRESSION: 1. Near complete consolidation of the right lower lobe may represent atelectasis, or pneumonia. This has progressed  since the prior CT. Continued follow-up to resolution recommended. 2. Bilateral pleural effusions, right greater than left, and similar to prior CT. 3. Moderate cardiomegaly. 4.  Aortic Atherosclerosis (ICD10-I70.0). Electronically Signed   By: Elgie Collard M.D.   On: 05/20/2023 16:57   CT Head Wo Contrast Result Date: 05/20/2023 CLINICAL DATA:  Headache with worsening frequency or severity. EXAM: CT HEAD WITHOUT CONTRAST TECHNIQUE: Contiguous axial images were obtained from the base of the skull through the vertex without intravenous contrast. RADIATION DOSE REDUCTION: This exam was performed according to the departmental dose-optimization program which includes automated exposure control, adjustment of the mA and/or kV according to patient size and/or use of iterative reconstruction technique. COMPARISON:  05/14/2023 FINDINGS: Brain: No acute finding to explain worsening headache. No focal abnormality affects the brainstem or cerebellum. 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 No sign of acute infarction, mass lesion, hemorrhage, hydrocephalus or extra-axial collection. Vascular: No abnormal vascular finding. Skull: Negative Sinuses/Orbits: Clear/normal Other: None IMPRESSION: 1. No acute finding to explain  worsening headache. 2. 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 Electronically Signed   By: Paulina Fusi M.D.   On: 05/20/2023 16:30   DG Chest 2 View Result Date: 05/20/2023 CLINICAL DATA:  Hypoxia. EXAM: CHEST - 2 VIEW COMPARISON:  05/14/2023 FINDINGS: Enlarged cardiac silhouette. Bilateral moderate-sized pleural effusions. Pulmonary vascular congestion. No pneumothorax. Calcified aorta. Thoracic spine fixation hardware. No acute osseous abnormalities. IMPRESSION:  Findings consistent with CHF. Electronically Signed   By: Layla Maw M.D.   On: 05/20/2023 14:59    Pertinent labs & imaging results that were available during my care of the patient were reviewed by me and considered in my medical decision making (see MDM for details).  Medications Ordered in ED Medications  cefTRIAXone (ROCEPHIN) 2 g in sodium chloride 0.9 % 100 mL IVPB (2 g Intravenous New Bag/Given 05/20/23 1735)  Chlorhexidine Gluconate Cloth 2 % PADS 6 each (has no administration in time range)  Chlorhexidine Gluconate Cloth 2 % PADS 6 each (has no administration in time range)  doxycycline (VIBRA-TABS) tablet 100 mg (100 mg Oral Given 05/20/23 1735)                                                                                                                                     Procedures .Critical Care  Performed by: Lonell Grandchild, MD Authorized by: Lonell Grandchild, MD   Critical care provider statement:    Critical care time (minutes):  30   Critical care was necessary to treat or prevent imminent or life-threatening deterioration of the following conditions:  Respiratory failure   Critical care was time spent personally by me on the following activities:  Development of treatment plan with patient or surrogate, discussions with consultants, evaluation of patient's response to treatment, examination of patient, ordering and review of laboratory studies, ordering and review of radiographic studies, ordering and performing treatments and interventions, pulse oximetry, re-evaluation of patient's condition and review of old charts admitted to the hospital.  (including critical care time)  Medical Decision Making / ED Course   MDM:  57 year old presenting with shortness of breath.  On exam, patient with pulmonary crackles.  Chest x-ray shows findings consistent with volume overload.  Suspect likely fluid overload in the setting of CHF and end-stage renal disease.   Patient did have recent Legionella pneumonia and has completed her antibiotics, denies fevers or cough but has had some ongoing chills, will obtain CT chest to ensure resolution.  Will obtain labs including CBC.  Will obtain BMP.  Patient noted to be hypoxic.  She will likely need to be admitted for dialysis given her hypoxia.  She also complains of some chronic unchanged back pain.  Physical exam with no acute findings, she has no neurologic symptoms, low concern for other process such as intrathoracic cause of chronic back pain.  She also  reports headaches, reports chronic headache, seems similar to chronic headache, but she does report it is worse than normal, will obtain CT head but lower concern for any acute intracranial process.  Will reassess.  Clinical Course as of 05/20/23 1737  Tue May 20, 2023  1736 Discussed with Dr. Mariea Clonts who has admitted the patient for possible pneumonia, volume overload, hypoxic respiratory failure.  Cussed with Dr. Juel Burrow who will help arrange patient for dialysis today. [WS]    Clinical Course User Index [WS] Lonell Grandchild, MD     Additional history obtained: -Additional history obtained from family -External records from outside source obtained and reviewed including: Chart review including previous notes, labs, imaging, consultation notes including recent c/d summary   Lab Tests: -I ordered, reviewed, and interpreted labs.   The pertinent results include:   Labs Reviewed  COMPREHENSIVE METABOLIC PANEL - Abnormal; Notable for the following components:      Result Value   BUN 50 (*)    Creatinine, Ser 5.34 (*)    Calcium 8.7 (*)    Albumin 3.2 (*)    GFR, Estimated 9 (*)    All other components within normal limits  CBC WITH DIFFERENTIAL/PLATELET - Abnormal; Notable for the following components:   MCHC 29.8 (*)    RDW 19.0 (*)    All other components within normal limits  BRAIN NATRIURETIC PEPTIDE - Abnormal; Notable for the following  components:   B Natriuretic Peptide >4,500.0 (*)    All other components within normal limits  CULTURE, BLOOD (ROUTINE X 2)  CULTURE, BLOOD (ROUTINE X 2)  PROCALCITONIN  LEGIONELLA PNEUMOPHILA SEROGP 1 UR AG  HEPATITIS B SURFACE ANTIGEN  HEPATITIS B SURFACE ANTIBODY, QUANTITATIVE    Notable for elevated BNP, no leukocytosis, stage V CKD  EKG   EKG Interpretation Date/Time:  Tuesday May 20 2023 16:38:51 EST Ventricular Rate:  81 PR Interval:  132 QRS Duration:  107 QT Interval:  372 QTC Calculation: 432 R Axis:   -62  Text Interpretation: Sinus rhythm Probable left atrial enlargement Incomplete RBBB and LAFB Consider right ventricular hypertrophy Left ventricular hypertrophy Nonspecific T abnormalities, lateral leads Confirmed by Alvino Blood ([id number]) on 05/20/2023 4:43:34 PM         Imaging Studies ordered: I ordered imaging studies including CT chest On my interpretation imaging demonstrates rll infiltrate  I independently visualized and interpreted imaging. I agree with the radiologist interpretation   Medicines ordered and prescription drug management: Meds ordered this encounter  Medications   cefTRIAXone (ROCEPHIN) 2 g in sodium chloride 0.9 % 100 mL IVPB    Antibiotic Indication::   CAP   doxycycline (VIBRA-TABS) tablet 100 mg   Chlorhexidine Gluconate Cloth 2 % PADS 6 each   Chlorhexidine Gluconate Cloth 2 % PADS 6 each    -I have reviewed the patients home medicines and have made adjustments as needed   Consultations Obtained: I requested consultation with the nephrologist,  and discussed lab and imaging findings as well as pertinent plan - they recommend: dialysis   Reevaluation: After the interventions noted above, I reevaluated the patient and found that their symptoms have improved  Co morbidities that complicate the patient evaluation  Past Medical History:  Diagnosis Date   Abnormal uterine bleeding (AUB) 06/15/2014   Cancer (HCC)     uterine   High blood pressure    Paraplegia (lower)    Renal disorder    Seizure disorder (HCC)    Seizures (HCC)  Suprapubic catheter (HCC)    Urinary tract infection       Dispostion: Disposition decision including need for hospitalization was considered, and patient admitted to the hospital.    Final Clinical Impression(s) / ED Diagnoses Final diagnoses:  Acute hypoxic respiratory failure (HCC)     This chart was dictated using voice recognition software.  Despite best efforts to proofread,  errors can occur which can change the documentation meaning.    Lonell Grandchild, MD 05/20/23 [phone]

## 2023-05-20 NOTE — Assessment & Plan Note (Addendum)
Blood pressure elevated up to 194/96, in the setting of volume overload -Resume clonidine, Norvasc, carvedilol -As needed labetalol 10mg  for systolic greater than 190

## 2023-05-20 NOTE — Assessment & Plan Note (Signed)
Recent hospitalization, 12/6- 12/9 for Legionella pneumonia, treated with Levaquin, who was discharged to continue 500 mg every other day for 4 doses.  She reports chronic unchanged dyspnea on exertion, improving cough.  She is afebrile without leukocytosis.  Not septic.  Sats currently 99% on room air.   - But CT today -  Near complete consolidation of the right lower lobe may represent atelectasis, or pneumonia. This has progressed since the prior CT. -Procalcitonin elevated at 0.54 - Possibly Radiologic improvement likely lagging behind clinical improvement, with some atelectasis, but with elevated procalcitonin will continue IV Levaquin for now.  -Low threshold to discontinue antibiotic

## 2023-05-20 NOTE — H&P (Signed)
History and Physical    [name] ZOX:[id number] DOB: 1966-05-25 DOA: 05/20/2023  PCP: Patient, No Pcp Per   Patient coming from: Home  I have personally briefly reviewed patient's old medical records in [facility]  Chief Complaint: High blood pressure  HPI: [name] is a 57 y.o. female with medical history significant for paraplegia, decubitus ulcer, ESRD, hypertension. Patient presented to the ED with reports of elevated blood pressure over 205/115, back pain, and headache.  She is on HD Monday Wednesday Friday, last week she missed 1 session- 12/20, but was compliant with the rest, her last HD was yesterday.  She reports chronic difficulty breathing on exertion that is unchanged.  She reports some improving nonproductive cough since recent hospitalization.  No Fevers.  No falls.  She has otherwise been doing okay since discharge.  Recent hospitalization 12/6 to 12/8 for acute hypoxic respiratory failure secondary to multilobar Legionella pneumonia with acute pulmonary edema.  She was treated with Levaquin as recommended by ID.  ED Course: Temperature 98.6.  Heart rate 82-90.  Respirate rate 18-20.  Blood pressure systolic 184-194.  O2 sats initially recorded at 89% on room air placed on 2 L, currently she is 99% on room air.   BNP elevated at greater than 4500.  WBC 7.5. CT chest Wo C shows-  Near complete consolidation of the right lower lobe may represent atelectasis, or pneumonia. This has progressed since the prior CT. Continued follow-up to resolution recommended. 2. Bilateral pleural effusions, right greater than left, and similar to prior CT. Nephrology consulted, plans for HD. IV ceftriaxone and oral doxycycline given in ED  Review of Systems: As per HPI all other systems reviewed and negative.  Past Medical History:  Diagnosis Date   Abnormal uterine bleeding (AUB) 06/15/2014   Cancer (HCC)    uterine   High blood pressure    Paraplegia (lower)     Renal disorder    Seizure disorder (HCC)    Seizures (HCC)    Suprapubic catheter (HCC)    Urinary tract infection     Past Surgical History:  Procedure Laterality Date   APPLICATION OF WOUND VAC Right 09/13/2021   (approximately 1-27mos ago) pressure sore on right hip   BACK SURGERY     Pt stated "before 2000"   BIOPSY  12/03/2022   Procedure: BIOPSY;  Surgeon: Dolores Frame, MD;  Location: [facility];  Service: Gastroenterology;;   ESOPHAGOGASTRODUODENOSCOPY N/A 09/20/2015   Procedure: ESOPHAGOGASTRODUODENOSCOPY (EGD);  Surgeon: Malissa Hippo, MD;  Location: [facility];  Service: Endoscopy;  Laterality: N/A;  730   ESOPHAGOGASTRODUODENOSCOPY (EGD) WITH PROPOFOL N/A 12/03/2022   Procedure: ESOPHAGOGASTRODUODENOSCOPY (EGD) WITH PROPOFOL;  Surgeon: Dolores Frame, MD;  Location: [facility];  Service: Gastroenterology;  Laterality: N/A;  1:15 pm, asa 3, pt knows to arrive at 10:30  dialysis pt, M,W & F   IR CATHETER TUBE CHANGE  04/02/2018   PERCUTANEOUS ENDOSCOPIC GASTROSTOMY (PEG) REMOVAL N/A 09/20/2015   Procedure: PERCUTANEOUS ENDOSCOPIC GASTROSTOMY (PEG) REMOVAL;  Surgeon: Malissa Hippo, MD;  Location: [facility];  Service: Endoscopy;  Laterality: N/A;   TEE WITHOUT CARDIOVERSION N/A 11/11/2022   Procedure: TRANSESOPHAGEAL ECHOCARDIOGRAM (TEE);  Surgeon: Pricilla Riffle, MD;  Location: [facility];  Service: Cardiovascular;  Laterality: N/A;     reports that she has never smoked. She has never used smokeless tobacco. She reports current alcohol use. She reports that she does not use drugs.  Allergies  Allergen  Reactions   Benadryl [Diphenhydramine Hcl (Sleep)] Hives   Daptomycin Hives   Linezolid Other (See Comments)    Patient self-discontinued treatment due to GI intolerance. Taking it along with moxifloxacin   Moxifloxacin Other (See Comments)    Patient self-discontinued treatment due to GI intolerance. Taking it along with linezolid   Quinine  Derivatives Other (See Comments)    Alters mental status   Vancomycin Other (See Comments)    Pt is tolerating this medication at HD   Azithromycin Itching and Rash   Tetracycline Itching    Able to tolerate Doxycycline.    Zosyn [Piperacillin Sod-Tazobactam So] Rash    Family History  Problem Relation Age of Onset   Cancer Mother    Hypertension Mother    Cancer Sister        breast and then spread everywhere.   Diabetes Paternal Grandmother    Hypertension Paternal Grandmother    Prior to Admission medications   Medication Sig Start Date End Date Taking? Authorizing Provider  acetaminophen (TYLENOL) 500 MG tablet Take 1,000 mg by mouth every 6 (six) hours as needed for moderate pain.    [provider]  albuterol (PROVENTIL) (2.5 MG/3ML) 0.083% nebulizer solution Take 3 mLs (2.5 mg total) by nebulization every 4 (four) hours as needed for wheezing or shortness of breath. 04/15/23 04/14/24  Shon Hale, MD  amLODipine (NORVASC) 5 MG tablet Take 1 tablet (5 mg total) by mouth daily. Take 1 tablet on Monday,Wednesday and Friday then take 2 tablets on Tuesday,Thursday,Saturday and Sunday. 04/15/23   Shon Hale, MD  carvedilol (COREG) 12.5 MG tablet Take 1 tablet (12.5 mg total) by mouth 2 (two) times daily with a meal. 04/15/23 08/03/23  Shon Hale, MD  cloNIDine (CATAPRES) 0.1 MG tablet Take 1 tablet (0.1 mg total) by mouth 2 (two) times daily. 04/15/23   Shon Hale, MD  cyclobenzaprine (FLEXERIL) 5 MG tablet Take 5 mg by mouth 3 (three) times daily as needed for muscle spasms. 04/11/23   [provider]  levETIRAcetam (KEPPRA) 500 MG tablet Take 2 tablets (1,000 mg total) by mouth every evening. Take an additional tablet on Monday,Wednesday and Friday evening after Dialysis 04/15/23   Shon Hale, MD  lidocaine-prilocaine (EMLA) cream Apply 1 Application topically 3 (three) times a week. 06/04/22   [provider]  naloxone [facility])  nasal spray 4 mg/0.1 mL Place 0.4 mg into the nose once. 10/03/22   [provider]  Nebulizers (COMPRESSOR/NEBULIZER) MISC 1 Units by Does not apply route as needed. 04/15/23   Shon Hale, MD  pantoprazole (PROTONIX) 40 MG tablet Take 40 mg by mouth daily. 08/03/22   [provider]  senna-docusate (SENOKOT-S) 8.6-50 MG tablet Take 2 tablets by mouth at bedtime. 11/12/22   Shon Hale, MD  sevelamer carbonate (RENVELA) 800 MG tablet Take 800 mg by mouth 3 (three) times daily. 12/11/22   [provider]    Physical Exam: Vitals:   05/20/23 1409 05/20/23 1419 05/20/23 1600 05/20/23 1845  BP: (!) 188/100  (!) 184/97 (!) 194/96  Pulse: 90  82 84  Resp: 20  18 19   Temp: 98.6 F (37 C)     TempSrc: Oral   Oral  SpO2: (!) 89% 96% 97% 99%  Weight:      Height:        Constitutional: NAD, calm, comfortable Vitals:   05/20/23 1409 05/20/23 1419 05/20/23 1600 05/20/23 1845  BP: (!) 188/100  (!) 184/97 (!) 194/96  Pulse:  90  82 84  Resp: 20  18 19   Temp: 98.6 F (37 C)     TempSrc: Oral   Oral  SpO2: (!) 89% 96% 97% 99%  Weight:      Height:       Eyes: PERRL, lids and conjunctivae normal ENMT: Mucous membranes are moist  Neck: normal, supple, no masses, Respiratory: clear to auscultation bilaterally, no wheezing, no crackles. Normal respiratory effort. No accessory muscle use.  Cardiovascular: Regular rate and rhythm, no murmurs / rubs / gallops. No extremity edema, extremities warm Abdomen: no tenderness, no masses palpated. No hepatosplenomegaly.   Musculoskeletal: no clubbing / cyanosis.  Paraplegic Skin: no rashes, lesions, ulcers. No induration Neurologic: No facial asymmetry, moving upper extremities spontaneously, paraplegic Psychiatric: Normal judgment and insight. Alert and oriented x 3. Normal mood.   Labs on Admission: I have personally reviewed following labs and imaging studies  CBC: Recent Labs  Lab 05/14/23 0040 05/20/23 1645   WBC 6.0 7.5  NEUTROABS  --  5.5  HGB 11.8* 12.1  HCT 39.2 40.6  MCV 94.7 95.1  PLT 160 178   Basic Metabolic Panel: Recent Labs  Lab 05/14/23 0040 05/20/23 1645  NA 138 137  K 5.3* 4.3  CL 100 101  CO2 25 24  GLUCOSE 111* 94  BUN 59* 50*  CREATININE 5.31* 5.34*  CALCIUM 9.5 8.7*   GFR: Estimated Creatinine Clearance: 8.4 mL/min (A) (by C-G formula based on SCr of 5.34 mg/dL (H)). Liver Function Tests: Recent Labs  Lab 05/14/23 0040 05/20/23 1645  AST 16 17  ALT 11 13  ALKPHOS 85 90  BILITOT 0.6 0.9  PROT 7.8 8.1  ALBUMIN 2.9* 3.2*    Radiological Exams on Admission: CT Chest Wo Contrast Result Date: 05/20/2023 CLINICAL DATA:  Respiratory distress. EXAM: CT CHEST WITHOUT CONTRAST TECHNIQUE: Multidetector CT imaging of the chest was performed following the standard protocol without IV contrast. RADIATION DOSE REDUCTION: This exam was performed according to the departmental dose-optimization program which includes automated exposure control, adjustment of the mA and/or kV according to patient size and/or use of iterative reconstruction technique. COMPARISON:  Chest radiograph dated 05/20/2023. chest CT dated 03/05/2023. FINDINGS: Evaluation of this exam is limited in the absence of intravenous contrast and streak artifact caused by spinal hardware. Cardiovascular: There is moderate cardiomegaly. Small pericardial effusion. There is mild atherosclerotic calcification of the thoracic aorta. No aneurysmal dilatation. The central pulmonary arteries are grossly unremarkable on this noncontrast CT. Mediastinum/Nodes: No obvious hilar or mediastinal adenopathy. Evaluation however is limited due to factors above. The esophagus is grossly unremarkable. Lungs/Pleura: There is near complete consolidation of the right lower lobe with air bronchogram and volume loss. Bilateral pleural effusions, right greater than left, and similar to prior CT. There are bibasilar atelectasis. No  pneumothorax. The central airways remain patent. Upper Abdomen: No acute abnormality. Musculoskeletal: There is diffuse subcutaneous edema and anasarca. Osteopenia with degenerative changes and posterior spinal fusion hardware. No acute osseous pathology. IMPRESSION: 1. Near complete consolidation of the right lower lobe may represent atelectasis, or pneumonia. This has progressed since the prior CT. Continued follow-up to resolution recommended. 2. Bilateral pleural effusions, right greater than left, and similar to prior CT. 3. Moderate cardiomegaly. 4.  Aortic Atherosclerosis (ICD10-I70.0). Electronically Signed   By: Elgie Collard M.D.   On: 05/20/2023 16:57   CT Head Wo Contrast Result Date: 05/20/2023 CLINICAL DATA:  Headache with worsening frequency or severity. EXAM: CT HEAD WITHOUT CONTRAST TECHNIQUE: Contiguous  axial images were obtained from the base of the skull through the vertex without intravenous contrast. RADIATION DOSE REDUCTION: This exam was performed according to the departmental dose-optimization program which includes automated exposure control, adjustment of the mA and/or kV according to patient size and/or use of iterative reconstruction technique. COMPARISON:  05/14/2023 FINDINGS: Brain: No acute finding to explain worsening headache. No focal abnormality affects the brainstem or cerebellum. 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 No sign of acute infarction, mass lesion, hemorrhage, hydrocephalus or extra-axial collection. Vascular: No abnormal vascular finding. Skull: Negative Sinuses/Orbits: Clear/normal Other: None IMPRESSION: 1. No acute finding to explain worsening headache. 2. 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 Electronically Signed   By:  Paulina Fusi M.D.   On: 05/20/2023 16:30   DG Chest 2 View Result Date: 05/20/2023 CLINICAL DATA:  Hypoxia. EXAM: CHEST - 2 VIEW COMPARISON:  05/14/2023 FINDINGS: Enlarged cardiac silhouette. Bilateral moderate-sized pleural effusions. Pulmonary vascular congestion. No pneumothorax. Calcified aorta. Thoracic spine fixation hardware. No acute osseous abnormalities. IMPRESSION: Findings consistent with CHF. Electronically Signed   By: Layla Maw M.D.   On: 05/20/2023 14:59    EKG: Independently reviewed.  Sinus rhythm rate 81, QTc 432, no significant change from prior.  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  LVH.  Assessment/Plan Principal Problem:   Volume overload Active Problems:   Legionella pneumonia (HCC)   Decubitus ulcer of buttock   Suprapubic catheter (HCC)   ESRD (end stage renal disease) (HCC)   Seizure disorder (HCC)   Paraplegia (HCC)   Essential hypertension   Dyspnea    Assessment and Plan: * Volume overload Volume overload in setting of ESRD and combined systolic and diastolic congestive heart failure.  Recent echo 03/2023-EF of 45 to 50% with grade 2 DD.  BNP elevated >4,500, CT chest without contrast-right greater than left bilateral pleural effusions similar to prior CT, also atelectasis versus pneumonia. -Per HD  Legionella pneumonia (HCC) Recent hospitalization, 12/6- 12/9 for Legionella pneumonia, treated with Levaquin, who was discharged to continue 500 mg every other day for 4 doses.  She reports chronic unchanged dyspnea on exertion, improving cough.  She is afebrile without leukocytosis.  Not septic.  Sats currently 99% on room air.   - But CT today -  Near complete consolidation of the right lower lobe may represent atelectasis, or pneumonia. This has progressed since the prior CT. -Procalcitonin elevated at 0.54 - Possibly Radiologic improvement likely lagging behind clinical improvement, with some atelectasis, but with elevated procalcitonin will  continue IV Levaquin for now.  -Low threshold to discontinue antibiotic  Decubitus ulcer of buttock Chronic and stable.  ESRD (end stage renal disease) (HCC) Initial session of HD 12/20, schedule Monday Wednesday Friday, otherwise compliant with HD.  Blood pressure elevated systolic up to 190s, concern for volume overload. - EDP talked to nephrology, plan for dialysis  Seizure disorder [facility]) Resume Keppra  Essential hypertension Blood pressure elevated up to 194/96, in the setting of volume overload -Resume clonidine, Norvasc, carvedilol -As needed labetalol 10mg  for systolic greater than 190  Paraplegia (HCC) Paraplegic with decubitus ulcer, suprapubic cath.   DVT prophylaxis: Heparin Code Status: FULL Family Communication: None at bedside Disposition Plan: ~ 2 days Consults called: nephrology Admission status: Inpt Tele I certify that at the point of admission it is my clinical judgment that  the patient will require inpatient hospital care spanning beyond 2 midnights from the point of admission due to high intensity of service, high risk for further deterioration and high frequency of surveillance required. The following factors support the patient status of inpatient:    Onnie Boer MD [facility]  05/20/2023, 6:49 PM    Author: Onnie Boer, MD 05/20/2023 7:28 PM  For on call review [email or web address].

## 2023-05-20 NOTE — ED Notes (Signed)
ED TO INPATIENT HANDOFF REPORT  ED Nurse Name and Phone #: [phone]  S Name/Age/Gender [name] 57 y.o. female Room/Bed: APA09/APA09  Code Status   Code Status: Prior  Home/SNF/Other Home Patient oriented to: self, place, time, and situation Is this baseline? Yes   Triage Complete: Triage complete  Chief Complaint Volume overload [E87.70]  Triage Note Pt reports:  Elevated BP 205/115 Headache  Started last week Back pain Chronic Worsening in the mid section    Allergies Allergies  Allergen Reactions   Benadryl [Diphenhydramine Hcl (Sleep)] Hives   Daptomycin Hives   Linezolid Other (See Comments)    Patient self-discontinued treatment due to GI intolerance. Taking it along with moxifloxacin   Moxifloxacin Other (See Comments)    Patient self-discontinued treatment due to GI intolerance. Taking it along with linezolid   Quinine Derivatives Other (See Comments)    Alters mental status   Vancomycin Other (See Comments)    Pt is tolerating this medication at HD   Azithromycin Itching and Rash   Tetracycline Itching    Able to tolerate Doxycycline.    Zosyn [Piperacillin Sod-Tazobactam So] Rash    Level of Care/Admitting Diagnosis ED Disposition     ED Disposition  Admit   Condition  --   Comment  Hospital Area: [facility] [[id number]]  Level of Care: Telemetry [5]  Covid Evaluation: Asymptomatic - no recent exposure (last 10 days) testing not required  Diagnosis: Volume overload [960454]  Admitting Physician: Onnie Boer [[id number]]  Attending Physician: Onnie Boer [phone]  Certification:: I certify this patient will need inpatient services for at least 2 midnights  Expected Medical Readiness: 05/22/2023          B Medical/Surgery History Past Medical History:  Diagnosis Date   Abnormal uterine bleeding (AUB) 06/15/2014   Cancer (HCC)    uterine   High blood pressure    Paraplegia (lower)    Renal disorder     Seizure disorder (HCC)    Seizures (HCC)    Suprapubic catheter (HCC)    Urinary tract infection    Past Surgical History:  Procedure Laterality Date   APPLICATION OF WOUND VAC Right 09/13/2021   (approximately 1-66mos ago) pressure sore on right hip   BACK SURGERY     Pt stated "before 2000"   BIOPSY  12/03/2022   Procedure: BIOPSY;  Surgeon: Dolores Frame, MD;  [facility]: [facility];  Service: Gastroenterology;;   ESOPHAGOGASTRODUODENOSCOPY N/A 09/20/2015   Procedure: ESOPHAGOGASTRODUODENOSCOPY (EGD);  Surgeon: Malissa Hippo, MD;  Location: [facility];  Service: Endoscopy;  Laterality: N/A;  730   ESOPHAGOGASTRODUODENOSCOPY (EGD) WITH PROPOFOL N/A 12/03/2022   Procedure: ESOPHAGOGASTRODUODENOSCOPY (EGD) WITH PROPOFOL;  Surgeon: Dolores Frame, MD;  Location: [facility];  Service: Gastroenterology;  Laterality: N/A;  1:15 pm, asa 3, pt knows to arrive at 10:30  dialysis pt, M,W & F   IR CATHETER TUBE CHANGE  04/02/2018   PERCUTANEOUS ENDOSCOPIC GASTROSTOMY (PEG) REMOVAL N/A 09/20/2015   Procedure: PERCUTANEOUS ENDOSCOPIC GASTROSTOMY (PEG) REMOVAL;  Surgeon: Malissa Hippo, MD;  [facility];  Service: Endoscopy;  Laterality: N/A;   TEE WITHOUT CARDIOVERSION N/A 11/11/2022   Procedure: TRANSESOPHAGEAL ECHOCARDIOGRAM (TEE);  Surgeon: Pricilla Riffle, MD;  Location: [facility] OR[facility];  Service: Cardiovascular;  Laterality: N/A;     A IV Location/Drains/Wounds Patient Lines/Drains/Airways Status     Active Line/Drains/Airways     Name Placement date Placement time Site Days   Peripheral  IV 05/20/23 20 G Right Antecubital 05/20/23  1647  Antecubital  less than 1   Fistula / Graft Left Upper arm Arteriovenous fistula --  --  Upper arm  --   Suprapubic Catheter Latex --  --  Latex  --   Pressure Injury 02/26/22 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 02/26/22  0120  -- 448   Pressure Injury 02/26/22  Ischial tuberosity Right Stage 4 - Full thickness tissue loss with exposed bone, tendon or muscle. 02/26/22  0120  -- 448   Wound / Incision (Open or Dehisced) 07/24/22 Other (Comment) Ischial tuberosity Left Stage IV 2x2 cm with 1.5cm depth small amount of serous exudate 07/24/22  2239  Ischial tuberosity  300            Intake/Output Last 24 hours No intake or output data in the 24 hours ending 05/20/23 1741  Labs/Imaging Results for orders placed or performed during the hospital encounter of 05/20/23 (from the past 48 hours)  Comprehensive metabolic panel     Status: Abnormal   Collection Time: 05/20/23  4:45 PM  Result Value Ref Range   Sodium 137 135 - 145 mmol/L   Potassium 4.3 3.5 - 5.1 mmol/L   Chloride 101 98 - 111 mmol/L   CO2 24 22 - 32 mmol/L   Glucose, Bld 94 70 - 99 mg/dL    Comment: Glucose reference range applies only to samples taken after fasting for at least 8 hours.   BUN 50 (H) 6 - 20 mg/dL   Creatinine, Ser 1.95 (H) 0.44 - 1.00 mg/dL   Calcium 8.7 (L) 8.9 - 10.3 mg/dL   Total Protein 8.1 6.5 - 8.1 g/dL   Albumin 3.2 (L) 3.5 - 5.0 g/dL   AST 17 15 - 41 U/L   ALT 13 0 - 44 U/L   Alkaline Phosphatase 90 38 - 126 U/L   Total Bilirubin 0.9 <1.2 mg/dL   GFR, Estimated 9 (L) >60 mL/min    Comment: (NOTE) Calculated using the CKD-EPI Creatinine Equation (2021)    Anion gap 12 5 - 15    Comment: Performed at [facility], [address], [place], [place] [address]  CBC with Differential     Status: Abnormal   Collection Time: 05/20/23  4:45 PM  Result Value Ref Range   WBC 7.5 4.0 - 10.5 K/uL   RBC 4.27 3.87 - 5.11 MIL/uL   Hemoglobin 12.1 12.0 - 15.0 g/dL   HCT 71.2 45.8 - 09.9 %   MCV 95.1 80.0 - 100.0 fL   MCH 28.3 26.0 - 34.0 pg   MCHC 29.8 (L) 30.0 - 36.0 g/dL   RDW 83.3 (H) 82.5 - 05.3 %   Platelets 178 150 - 400 K/uL   nRBC 0.0 0.0 - 0.2 %   Neutrophils Relative % 73 %   Neutro Abs 5.5 1.7 - 7.7 K/uL   Lymphocytes Relative 18 %   Lymphs Abs 1.3  0.7 - 4.0 K/uL   Monocytes Relative 6 %   Monocytes Absolute 0.5 0.1 - 1.0 K/uL   Eosinophils Relative 2 %   Eosinophils Absolute 0.1 0.0 - 0.5 K/uL   Basophils Relative 1 %   Basophils Absolute 0.1 0.0 - 0.1 K/uL   Immature Granulocytes 0 %   Abs Immature Granulocytes 0.02 0.00 - 0.07 K/uL    Comment: Performed at [facility], [address], [place], [place] [address]  Brain  natriuretic peptide     Status: Abnormal   Collection Time: 05/20/23  4:45 PM  Result Value Ref Range   B Natriuretic Peptide >4,500.0 (H) 0.0 - 100.0 pg/mL    Comment: Performed at [facility], [address], [place], [place] [address]   CT Chest Wo Contrast Result Date: 05/20/2023 CLINICAL DATA:  Respiratory distress. EXAM: CT CHEST WITHOUT CONTRAST TECHNIQUE: Multidetector CT imaging of the chest was performed following the standard protocol without IV contrast. RADIATION DOSE REDUCTION: This exam was performed according to the departmental dose-optimization program which includes automated exposure control, adjustment of the mA and/or kV according to patient size and/or use of iterative reconstruction technique. COMPARISON:  Chest radiograph dated 05/20/2023. chest CT dated 03/05/2023. FINDINGS: Evaluation of this exam is limited in the absence of intravenous contrast and streak artifact caused by spinal hardware. Cardiovascular: There is moderate cardiomegaly. Small pericardial effusion. There is mild atherosclerotic calcification of the thoracic aorta. No aneurysmal dilatation. The central pulmonary arteries are grossly unremarkable on this noncontrast CT. Mediastinum/Nodes: No obvious hilar or mediastinal adenopathy. Evaluation however is limited due to factors above. The esophagus is grossly unremarkable. Lungs/Pleura: There is near complete consolidation of the right lower lobe with air bronchogram and volume loss. Bilateral pleural effusions, right greater than left, and similar to prior CT. There are bibasilar  atelectasis. No pneumothorax. The central airways remain patent. Upper Abdomen: No acute abnormality. Musculoskeletal: There is diffuse subcutaneous edema and anasarca. Osteopenia with degenerative changes and posterior spinal fusion hardware. No acute osseous pathology. IMPRESSION: 1. Near complete consolidation of the right lower lobe may represent atelectasis, or pneumonia. This has progressed since the prior CT. Continued follow-up to resolution recommended. 2. Bilateral pleural effusions, right greater than left, and similar to prior CT. 3. Moderate cardiomegaly. 4.  Aortic Atherosclerosis (ICD10-I70.0). Electronically Signed   By: Elgie Collard M.D.   On: 05/20/2023 16:57   CT Head Wo Contrast Result Date: 05/20/2023 CLINICAL DATA:  Headache with worsening frequency or severity. EXAM: CT HEAD WITHOUT CONTRAST TECHNIQUE: Contiguous axial images were obtained from the base of the skull through the vertex without intravenous contrast. RADIATION DOSE REDUCTION: This exam was performed according to the departmental dose-optimization program which includes automated exposure control, adjustment of the mA and/or kV according to patient size and/or use of iterative reconstruction technique. COMPARISON:  05/14/2023 FINDINGS: Brain: No acute finding to explain worsening headache. No focal abnormality affects the brainstem or cerebellum. 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 No sign of acute infarction, mass lesion, hemorrhage, hydrocephalus or extra-axial collection. Vascular: No abnormal vascular finding. Skull: Negative Sinuses/Orbits: Clear/normal Other: None IMPRESSION: 1. No acute finding to explain worsening headache. 2. 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  Electronically Signed   By: Paulina Fusi M.D.   On: 05/20/2023 16:30   DG Chest 2 View Result Date: 05/20/2023 CLINICAL DATA:  Hypoxia. EXAM: CHEST - 2 VIEW COMPARISON:  05/14/2023 FINDINGS: Enlarged cardiac silhouette. Bilateral moderate-sized pleural effusions. Pulmonary vascular congestion. No pneumothorax. Calcified aorta. Thoracic spine fixation hardware. No acute osseous abnormalities. IMPRESSION: Findings consistent with CHF. Electronically Signed   By: Layla Maw M.D.   On: 05/20/2023 14:59    Pending Labs Unresulted Labs (From admission, onward)     Start     Ordered   05/20/23 1725  Hepatitis B surface antigen  (  New Admission Hemo Labs (Hepatitis B))  Once,   URGENT        05/20/23 1725   05/20/23 1725  Hepatitis B surface antibody,quantitative  (New Admission Hemo Labs (Hepatitis B))  Once,   URGENT        05/20/23 1725   05/20/23 1720  Blood culture (routine x 2)  BLOOD CULTURE X 2,   R (with STAT occurrences)      05/20/23 1719   05/20/23 1714  Legionella Pneumophila Serogp 1 Ur Ag  Once,   URGENT        05/20/23 1713   05/20/23 1713  Procalcitonin  Once,   URGENT       References:    Procalcitonin Lower Respiratory Tract Infection AND Sepsis Procalcitonin Algorithm   05/20/23 1713   Signed and Held  Renal function panel  Once,   R        Signed and Held   Signed and Held  CBC  Once,   R        Signed and Held            Vitals/Pain Today's Vitals   05/20/23 1408 05/20/23 1409 05/20/23 1419 05/20/23 1600  BP:  (!) 188/100  (!) 184/97  Pulse:  90  82  Resp:  20  18  Temp:  98.6 F (37 C)    TempSrc:  Oral    SpO2:  (!) 89% 96% 97%  Weight: 44.5 kg     Height: 5\' 2"  (1.575 m)     PainSc: 7        Isolation Precautions No active isolations  Medications Medications  cefTRIAXone (ROCEPHIN) 2 g in sodium chloride 0.9 % 100 mL IVPB (2 g Intravenous New Bag/Given 05/20/23 1735)  Chlorhexidine Gluconate Cloth 2 % PADS 6 each (has no administration in  time range)  Chlorhexidine Gluconate Cloth 2 % PADS 6 each (has no administration in time range)  doxycycline (VIBRA-TABS) tablet 100 mg (100 mg Oral Given 05/20/23 1735)    Mobility manual wheelchair     Focused Assessments    R Recommendations: See Admitting Provider Note  Report given to:   Additional Notes:

## 2023-05-20 NOTE — Progress Notes (Signed)
   HEMODIALYSIS TREATMENT NOTE:  2 hour heparin-free extra/urgent HD session completed using left upper arm AVF (15g/antegrade). Goal met: 2.2 liters removed without interruption in UF.  Hypertensive throughout session; Labetalol 10mg  IVP given at end of treatment --> BP 180/86.  Oral anti-hypertensives now due -- d/w primary nurse.  Post-HD:  05/20/23 2200  Vital Signs  Temp 98.4 F (36.9 C)  Temp Source Axillary  Pulse Rate 74  Pulse Rate Source Monitor  Resp 20  BP (!) 180/86  BP Location Right Arm  BP Method Automatic  Patient Position (if appropriate) Lying  Oxygen Therapy  SpO2 100 %  O2 Device Nasal Cannula  O2 Flow Rate (L/min) 2 L/min  Dialysis Weight  Weight 42.2 kg  Type of Weight Post-Dialysis  Post Treatment  Dialyzer Clearance Lightly streaked  Hemodialysis Intake (mL) 0 mL  Liters Processed 42.2  Fluid Removed (mL) 2200 mL  Tolerated HD Treatment Yes  Post-Hemodialysis Comments Goal met  AVG/AVF Arterial Site Held (minutes) 7 minutes  AVG/AVF Venous Site Held (minutes) 7 minutes  Fistula / Graft Left Upper arm Arteriovenous fistula  No placement date or time found.   Placed prior to admission: Yes  Orientation: Left  Access Location: Upper arm  Access Type: Arteriovenous fistula  Site Condition No complications  Fistula / Graft Assessment Thrill;Bruit  Status Patent    Arman Filter, RN [facility]

## 2023-05-20 NOTE — Assessment & Plan Note (Deleted)
Recent hospitalization, 12/6- 12/9 for Legionella pneumonia, treated with Levaquin, who was discharged to continue 500 mg every other day for 4 doses.  She reports chronic unchanged dyspnea on exertion, improving cough.  She is afebrile without leukocytosis.  Not septic.  Sats currently 99% on room air.   - But CT today -  Near complete consolidation of the right lower lobe may represent atelectasis, or pneumonia. This has progressed since the prior CT. -Procalcitonin elevated at 0.54 - Possibly Radiologic improvement likely lagging behind clinical improvement, with some atelectasis, but with elevated procalcitonin will continue IV Levaquin for now.  -Low threshold to discontinue antibiotic

## 2023-05-20 NOTE — Assessment & Plan Note (Signed)
Initial session of HD 12/20, schedule Monday Wednesday Friday, otherwise compliant with HD.  Blood pressure elevated systolic up to 190s, concern for volume overload. - EDP talked to nephrology, plan for dialysis

## 2023-05-20 NOTE — Assessment & Plan Note (Signed)
Resume Keppra 

## 2023-05-20 NOTE — Assessment & Plan Note (Signed)
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  Recent echo 03/2023-EF of 45 to 50% with grade 2 DD.  BNP elevated >4,500, CT chest without contrast-right greater than left bilateral pleural effusions similar to prior CT, also atelectasis versus pneumonia. -Per HD

## 2023-05-20 NOTE — Consult Note (Signed)
Pharmacy Antibiotic Note  [name] is a 57 y.o. female admitted on 05/20/2023 with pneumonia. Patient is ESRD on dialysis. Pharmacy has been consulted for levofloxacin dosing.  Plan: Give levofloxacin 750 mg IV x1, then 500 mg IV every 48 hours   Height: 5\' 2"  (157.5 cm) Weight: 44.5 kg (98 lb) IBW/kg (Calculated) : 50.1  Temp (24hrs), Avg:98.6 F (37 C), Min:98.6 F (37 C), Max:98.6 F (37 C)  Recent Labs  Lab 05/14/23 0040 05/20/23 1645  WBC 6.0 7.5  CREATININE 5.31* 5.34*    Estimated Creatinine Clearance: 8.4 mL/min (A) (by C-G formula based on SCr of 5.34 mg/dL (H)).    Allergies  Allergen Reactions   Benadryl [Diphenhydramine Hcl (Sleep)] Hives   Daptomycin Hives   Linezolid Other (See Comments)    Patient self-discontinued treatment due to GI intolerance. Taking it along with moxifloxacin   Moxifloxacin Other (See Comments)    Patient self-discontinued treatment due to GI intolerance. Taking it along with linezolid   Quinine Derivatives Other (See Comments)    Alters mental status   Vancomycin Other (See Comments)    Pt is tolerating this medication at HD   Azithromycin Itching and Rash   Tetracycline Itching    Able to tolerate Doxycycline.    Zosyn [Piperacillin Sod-Tazobactam So] Rash    Antimicrobials this admission: Ceftriaxone 12/24 x 1 Doxycycline 12/24 x 1  Levaquin 12/24>>  Dose adjustments this admission: N/A  Microbiology results: 12/24 BCx: pending   Thank you for allowing pharmacy to be a part of this patient's care.  Barrie Folk, PharmD 05/20/2023 7:27 PM

## 2023-05-20 NOTE — Assessment & Plan Note (Signed)
Chronic and stable.   

## 2023-05-20 NOTE — Assessment & Plan Note (Signed)
Paraplegic with decubitus ulcer, suprapubic cath.

## 2023-05-21 DIAGNOSIS — E8770 Fluid overload, unspecified: Secondary | ICD-10-CM | POA: Diagnosis not present

## 2023-05-21 LAB — BASIC METABOLIC PANEL
Anion gap: 12 (ref 5–15)
BUN: 29 mg/dL — ABNORMAL HIGH (ref 6–20)
CO2: 26 mmol/L (ref 22–32)
Calcium: 8.4 mg/dL — ABNORMAL LOW (ref 8.9–10.3)
Chloride: 96 mmol/L — ABNORMAL LOW (ref 98–111)
Creatinine, Ser: 3.77 mg/dL — ABNORMAL HIGH (ref 0.44–1.00)
GFR, Estimated: 14 mL/min — ABNORMAL LOW (ref >60)
Glucose, Bld: 77 mg/dL (ref 70–99)
Potassium: 3.6 mmol/L (ref 3.5–5.1)
Sodium: 134 mmol/L — ABNORMAL LOW (ref 135–145)

## 2023-05-21 LAB — CBC
HCT: 35.3 % — ABNORMAL LOW (ref 36.0–46.0)
Hemoglobin: 10.5 g/dL — ABNORMAL LOW (ref 12.0–15.0)
MCH: 27.8 pg (ref 26.0–34.0)
MCHC: 29.7 g/dL — ABNORMAL LOW (ref 30.0–36.0)
MCV: 93.4 fL (ref 80.0–100.0)
Platelets: 165 10*3/uL (ref 150–400)
RBC: 3.78 MIL/uL — ABNORMAL LOW (ref 3.87–5.11)
RDW: 18.5 % — ABNORMAL HIGH (ref 11.5–15.5)
WBC: 6.3 10*3/uL (ref 4.0–10.5)
nRBC: 0 % (ref 0.0–0.2)

## 2023-05-21 LAB — GLUCOSE, CAPILLARY: Glucose-Capillary: 104 mg/dL — ABNORMAL HIGH (ref 70–99)

## 2023-05-21 LAB — HEPATITIS B SURFACE ANTIBODY, QUANTITATIVE: Hep B S AB Quant (Post): 327 m[IU]/mL

## 2023-05-21 NOTE — Consult Note (Signed)
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 She was last seen at that site on 11/21.  They have ordered Aquacel to the locations.   Left ischium; Stage 4 Pressure Injury Right ischium; Stage 4 Pressure Injury; Pressure Injury POA: Yes Dressing procedure/placement/frequency: Continue plan of care as previously ordered by the outpatient wound care center with Alginate and foam dressing 3x wk. Topical treatment orders provided for beside nurses to perform:  Apply Aquacel [place] # [phone]) to left and right ischium wounds Q Mon/Wed/Fri.  Cover with foam dressing.  MOISTEN AQUACEL WITH NS IF STUCK TO WOUND BED FOR ATRAUMATIC REMOVAL.    Patient should be placed on a low air loss mattress for pressure redistribution and moisture management.   Patient should continue to follow with outpatient wound care center after discharge.    POC discussed with bedside nurse. WOC team will not follow Re-consult if further needs arise.    Thank you,    Priscella Mann MSN, RN-BC, [employer] [phone]

## 2023-05-21 NOTE — TOC Initial Note (Signed)
Transition of Care [facility]) - Initial/Assessment Note    Patient Details  Name: [name] MRN: [id number] Date of Birth: 04-22-1966  [facility] CM/SW Contact:    Elliot Gault, LCSW Phone Number: 05/21/2023, 12:19 PM  Clinical Narrative:                  Pt admitted from home. She is high risk for readmission. Pt known to TOC from recent admission.   Pt lives at home with her daughter who is her aide. Pt active with [facility] RN for wound care. Pt is not ambulatory. She has a hospital bed, hoyer lift, power wheelchair, and manual wheelchair for DME.   Pt uses [facility] or [facility] for appointment transportation.   TOC will follow and assist with dc planning.   Expected Discharge Plan: Home w Home Health Services Barriers to Discharge: Continued Medical Work up   Patient Goals and CMS Choice Patient states their goals for this hospitalization and ongoing recovery are:: get better CMS Medicare.gov Compare Post Acute Care list provided to:: Patient        Expected Discharge Plan and Services In-house Referral: Clinical Social Work   Post Acute Care Choice: Resumption of Svcs/PTA Provider Living arrangements for the past 2 months: Single Family Home                                      Prior Living Arrangements/Services Living arrangements for the past 2 months: Single Family Home Lives with:: Adult Children Patient language and need for interpreter reviewed:: Yes Do you feel safe going back to the place where you live?: Yes      Need for Family Participation in Patient Care: Yes (Comment) Care giver support system in place?: Yes (comment) Current home services: DME, Home RN Criminal Activity/Legal Involvement Pertinent to Current Situation/Hospitalization: No - Comment as needed  Activities of Daily Living   ADL Screening (condition at time of admission) Independently performs ADLs?: No Is the patient deaf or have difficulty hearing?:  No Does the patient have difficulty seeing, even when wearing glasses/contacts?: No Does the patient have difficulty concentrating, remembering, or making decisions?: No  Permission Sought/Granted                  Emotional Assessment Appearance:: Appears stated age     Orientation: : Oriented to Self, Oriented to Place, Oriented to  Time, Oriented to Situation Alcohol / Substance Use: Not Applicable Psych Involvement: No (comment)  Admission diagnosis:  Volume overload [E87.70] Acute hypoxic respiratory failure (HCC) [J96.01] Patient Active Problem List   Diagnosis Date Noted   Volume overload 05/20/2023   Legionella pneumonia (HCC) 05/05/2023   Acute respiratory failure with hypoxia (HCC) 05/02/2023   Pulmonary edema 04/13/2023   Diarrhea 04/13/2023   Respiratory failure with hypoxia (HCC) 04/01/2023   Dyspnea and respiratory abnormalities 03/17/2023   Dyspnea 03/05/2023   Retroperitoneal hematoma 12/31/2022   Subcutaneous hematoma 12/31/2022   Hip osteomyelitis, right (HCC) 12/18/2022   Sepsis due to undetermined organism (HCC) 12/18/2022   Chronic pain 12/14/2022   Bacteremia 11/08/2022   Loss of weight 10/26/2022   N&V (nausea and vomiting) 10/26/2022   Cellulitis of knee, left 07/24/2022   Infected decubitus ulcer 07/23/2022   Osteomyelitis of multiple sites-chronic     Chronic anemia 02/25/2022   ESRD (end stage renal disease) (HCC) 02/25/2022   Abscess of buttock, right  08/25/2021   GERD (gastroesophageal reflux disease) 08/25/2021   Anxiety 08/25/2021   Decubitus ulcers 08/25/2021   Anemia 08/24/2021   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 chronic kidney disease (HCC) 04/25/2017   AKI (acute kidney injury) (HCC) 04/25/2017   Dehydration 04/25/2017   Metabolic acidosis 04/25/2017   Altered mental status 06/22/2016   Altered mental state 08/16/2015   Pressure ulcer 05/16/2015   Acute encephalopathy 05/15/2015   Malnutrition (HCC)    Encephalopathy acute 10/10/2014    Sepsis (HCC) 10/10/2014   PNA (pneumonia) 10/10/2014   Encephalomalacia 10/10/2014   TBI (traumatic brain injury) (HCC) 10/10/2014   Severe protein-calorie malnutrition (HCC) 10/10/2014   UTI (urinary tract infection) 08/26/2014   Hypokalemia 08/26/2014   Abnormal uterine bleeding (AUB) 06/15/2014   Essential hypertension    Seizure disorder (HCC)    Status epilepticus (HCC) 05/02/2014   Hypertensive emergency without congestive heart failure 05/02/2014   Acute renal failure (HCC) 05/21/2013   Hyperkalemia 05/21/2013   Hypothermia 05/21/2013   Paraplegia (HCC) 06/25/2012   Suprapubic catheter (HCC) 06/25/2012   Decubitus ulcer of buttock 06/25/2012   PCP:  Patient, No Pcp Per Pharmacy:   [facility] [place], [place] - [address] [place] [address] Phone: [phone] Fax: [phone]     Social Drivers of Health (SDOH) Social History: SDOH Screenings   Food Insecurity: No Food Insecurity (05/20/2023)  Housing: Unknown (05/20/2023)  Transportation Needs: No Transportation Needs (05/20/2023)  Utilities: Not At Risk (05/20/2023)  Tobacco Use: Low Risk  (05/20/2023)   SDOH Interventions:     Readmission Risk Interventions    05/21/2023   12:18 PM 05/03/2023    3:06 PM 04/14/2023   11:47 AM  Readmission Risk Prevention Plan  Transportation Screening Complete Complete Complete  Medication Review Ocean[employer]) Complete Complete Complete  PCP or Specialist appointment within 3-5 days of discharge  Complete Not Complete  HRI or Home Care Consult Complete Complete Complete  SW Recovery Care/Counseling Consult Complete Complete Complete  Palliative Care Screening Not Applicable Not Applicable Not Applicable  Skilled Nursing Facility Not Applicable Not Applicable Not Applicable

## 2023-05-21 NOTE — Plan of Care (Signed)
  Problem: Education: Goal: Knowledge of General Education information will improve Description: Including pain rating scale, medication(s)/side effects and non-pharmacologic comfort measures Outcome: Progressing   Problem: Health Behavior/Discharge Planning: Goal: Ability to manage health-related needs will improve Outcome: Progressing   Problem: Clinical Measurements: Goal: Ability to maintain clinical measurements within normal limits will improve Outcome: Progressing Goal: Will remain free from infection Outcome: Progressing Goal: Diagnostic test results will improve Outcome: Progressing Goal: Respiratory complications will improve Outcome: Progressing Goal: Cardiovascular complication will be avoided Outcome: Progressing   Problem: Activity: Goal: Risk for activity intolerance will decrease Outcome: Progressing   Problem: Nutrition: Goal: Adequate nutrition will be maintained Outcome: Progressing   Problem: Coping: Goal: Level of anxiety will decrease Outcome: Progressing   Problem: Elimination: Goal: Will not experience complications related to bowel motility Outcome: Progressing Goal: Will not experience complications related to urinary retention Outcome: Progressing   Problem: Pain Management: Goal: General experience of comfort will improve Outcome: Progressing   Problem: Safety: Goal: Ability to remain free from injury will improve Outcome: Progressing   Problem: Skin Integrity: Goal: Risk for impaired skin integrity will decrease Outcome: Progressing   Problem: Education: Goal: Ability to demonstrate management of disease process will improve Outcome: Progressing Goal: Ability to verbalize understanding of medication therapies will improve Outcome: Progressing Goal: Individualized Educational Video(s) Outcome: Progressing   Problem: Activity: Goal: Capacity to carry out activities will improve Outcome: Progressing   Problem: Cardiac: Goal:  Ability to achieve and maintain adequate cardiopulmonary perfusion will improve Outcome: Progressing   Problem: Education: Goal: Ability to demonstrate management of disease process will improve Outcome: Progressing Goal: Ability to verbalize understanding of medication therapies will improve Outcome: Progressing Goal: Individualized Educational Video(s) Outcome: Progressing   Problem: Activity: Goal: Capacity to carry out activities will improve Outcome: Progressing   Problem: Cardiac: Goal: Ability to achieve and maintain adequate cardiopulmonary perfusion will improve Outcome: Progressing

## 2023-05-21 NOTE — Progress Notes (Signed)
PROGRESS NOTE    [name]  ZDG:[id number] DOB: 05-10-1966 DOA: 05/20/2023 PCP: Patient, No Pcp Per   Brief Narrative:    [name] is a 57 y.o. female with medical history significant for paraplegia, decubitus ulcer, ESRD, hypertension. Patient presented to the ED with reports of elevated blood pressure over 205/115, back pain, and headache.  She is on HD Monday Wednesday Friday, last week she missed 1 session- 12/20, but was compliant with the rest, her last HD was yesterday.   Recent hospitalization 12/6 to 12/8 for acute hypoxic respiratory failure secondary to multilobar Legionella pneumonia with acute pulmonary edema.  She was treated with Levaquin as recommended by ID.   Assessment & Plan:   Principal Problem:   Volume overload Active Problems:   Legionella pneumonia (HCC)   Decubitus ulcer of buttock   Suprapubic catheter (HCC)   ESRD (end stage renal disease) (HCC)   Seizure disorder (HCC)   Paraplegia (HCC)   Essential hypertension   Dyspnea  Assessment and Plan:  Volume overload Volume overload in setting of ESRD and combined systolic and diastolic congestive heart failure.  Recent echo 03/2023-EF of 45 to 50% with grade 2 DD.  BNP elevated >4,500, CT chest without contrast-right greater than left bilateral pleural effusions similar to prior CT, also atelectasis versus pneumonia. -Hemodialysis performed 12/24, further dialysis per nephrology  Legionella pneumonia [facility]) Recent hospitalization, 12/6- 12/9 for Legionella pneumonia, treated with Levaquin, who was discharged to continue 500 mg every other day for 4 doses.  She reports chronic unchanged dyspnea on exertion, improving cough.  She is afebrile without leukocytosis.  Not septic.  Sats currently 99% on room air.   - But CT today -  Near complete consolidation of the right lower lobe may represent atelectasis, or pneumonia. This has progressed since the prior CT. -Procalcitonin elevated at 0.54 -  Possibly Radiologic improvement likely lagging behind clinical improvement, with some atelectasis, but with elevated procalcitonin will continue IV Levaquin for now.  -Low threshold to discontinue antibiotic  Decubitus ulcer of buttock Chronic and stable.  ESRD (end stage renal disease) (HCC) Initial session of HD 12/20, schedule Monday Wednesday Friday, otherwise compliant with HD.  Blood pressure elevated systolic up to 190s, concern for volume overload. - EDP talked to nephrology, plan for dialysis  Seizure disorder [facility]) Resume Keppra  Essential hypertension Blood pressure elevated up to 194/96, in the setting of volume overload -Resume clonidine, Norvasc, carvedilol -As needed labetalol 10mg  for systolic greater than 190  Paraplegia (HCC) Paraplegic with decubitus ulcer, suprapubic cath.  DVT prophylaxis:Heparin Code Status: Full Family Communication: Discussed with daughter on phone 12/25 Disposition Plan:  Status is: Inpatient Remains inpatient appropriate because: Need for IV medications and inpatient dialysis.   Skin Assessment:  I have examined the patient's skin and I agree with the wound assessment as performed by the wound care RN as outlined below:  Pressure Injury 02/26/22 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 (Active)  02/26/22 0120  Location: Ischial tuberosity  Location Orientation: Left  Staging: Stage 4 - Full thickness tissue loss with exposed bone, tendon or muscle.  Wound Description (Comments):   Present on Admission: Yes  Dressing Type Foam - Lift dressing to assess site every shift (aquacel) 05/05/23 1657     Pressure Injury 02/26/22 Ischial tuberosity Right Stage 4 - Full thickness tissue loss with exposed bone, tendon or muscle. (Active)  02/26/22 0120  Location: Ischial tuberosity  Location Orientation: Right  Staging: Stage 4 - Full thickness tissue loss with exposed bone, tendon or  muscle.  Wound Description (Comments):   Present on Admission: Yes  Dressing Type Foam - Lift dressing to assess site every shift (aquqacel) 05/05/23 1657    Consultants:  Nephrology  Procedures:  None  Antimicrobials:  None   Subjective: Patient seen and evaluated today with no new acute complaints or concerns. No acute concerns or events noted overnight.  She continues to have oxygen requirement and ongoing shortness of breath despite dialysis yesterday.  Objective: Vitals:   05/20/23 2140 05/20/23 2200 05/21/23 0430 05/21/23 1054  BP: (!) 192/98 (!) 180/86 (!) 157/96 (!) 154/84  Pulse:  74 76   Resp: (!) 22 20 (!) 23 20  Temp:  98.4 F (36.9 C) 98 F (36.7 C) 98.1 F (36.7 C)  TempSrc:  Axillary  Oral  SpO2:  100% 95% 95%  Weight:  42.2 kg 43.2 kg   Height:        Intake/Output Summary (Last 24 hours) at 05/21/2023 1217 Last data filed at 05/21/2023 0900 Gross per 24 hour  Intake 240 ml  Output 2200 ml  Net -1960 ml   Filed Weights   05/20/23 1915 05/20/23 2200 05/21/23 0430  Weight: 44.5 kg 42.2 kg 43.2 kg    Examination:  General exam: Appears calm and comfortable  Respiratory system: Clear to auscultation. Respiratory effort normal.  Nasal cannula oxygen. Cardiovascular system: S1 & S2 heard, RRR.  Gastrointestinal system: Abdomen is soft Central nervous system: Alert and awake Extremities: No edema Skin: No significant lesions noted Psychiatry: Flat affect.    Data Reviewed: I have personally reviewed following labs and imaging studies  CBC: Recent Labs  Lab 05/20/23 1645 05/21/23 0450  WBC 7.5 6.3  NEUTROABS 5.5  --   HGB 12.1 10.5*  HCT 40.6 35.3*  MCV 95.1 93.4  PLT 178 165   Basic Metabolic Panel: Recent Labs  Lab 05/20/23 1645 05/21/23 0450  NA 137 134*  K 4.3 3.6  CL 101 96*  CO2 24 26  GLUCOSE 94 77  BUN 50* 29*  CREATININE 5.34* 3.77*  CALCIUM 8.7* 8.4*   GFR: Estimated Creatinine Clearance: 11.5 mL/min (A) (by  C-G formula based on SCr of 3.77 mg/dL (H)). Liver Function Tests: Recent Labs  Lab 05/20/23 1645  AST 17  ALT 13  ALKPHOS 90  BILITOT 0.9  PROT 8.1  ALBUMIN 3.2*   No results for input(s): "LIPASE", "AMYLASE" in the last 168 hours. No results for input(s): "AMMONIA" in the last 168 hours. Coagulation Profile: No results for input(s): "INR", "PROTIME" in the last 168 hours. Cardiac Enzymes: No results for input(s): "CKTOTAL", "CKMB", "CKMBINDEX", "TROPONINI" in the last 168 hours. BNP (last 3 results) No results for input(s): "PROBNP" in the last 8760 hours. HbA1C: No results for input(s): "HGBA1C" in the last 72 hours. CBG: Recent Labs  Lab 05/21/23 1122  GLUCAP 104*   Lipid Profile: No results for input(s): "CHOL", "HDL", "LDLCALC", "TRIG", "CHOLHDL", "LDLDIRECT" in the last 72 hours. Thyroid Function Tests: No results for input(s): "TSH", "T4TOTAL", "FREET4", "T3FREE", "THYROIDAB" in the last 72 hours. Anemia Panel: No results for input(s): "VITAMINB12", "FOLATE", "FERRITIN", "TIBC", "IRON", "RETICCTPCT" in the last 72 hours. Sepsis Labs: Recent Labs  Lab 05/20/23 1645  PROCALCITON 0.54    Recent Results (from the past 240 hours)  Blood culture (routine x 2)     Status: None (Preliminary result)   Collection Time:  05/20/23  6:21 PM   Specimen: BLOOD  Result Value Ref Range Status   Specimen Description BLOOD RIGHT ANTECUBITAL  Final   Special Requests   Final    BOTTLES DRAWN AEROBIC AND ANAEROBIC Blood Culture adequate volume   Culture   Final    NO GROWTH < 24 HOURS Performed at [facility], [address] [place], [place] [address]    Report Status PENDING  Incomplete  Blood culture (routine x 2)     Status: None (Preliminary result)   Collection Time: 05/20/23  6:21 PM   Specimen: BLOOD  Result Value Ref Range Status   Specimen Description BLOOD RIGHT ANTECUBITAL  Final   Special Requests   Final    BOTTLES DRAWN AEROBIC AND ANAEROBIC Blood Culture  adequate volume   Culture   Final    NO GROWTH < 24 HOURS Performed at [facility], [address] [place], [place] [address]    Report Status PENDING  Incomplete         Radiology Studies: CT Chest Wo Contrast Result Date: 05/20/2023 CLINICAL DATA:  Respiratory distress. EXAM: CT CHEST WITHOUT CONTRAST TECHNIQUE: Multidetector CT imaging of the chest was performed following the standard protocol without IV contrast. RADIATION DOSE REDUCTION: This exam was performed according to the departmental dose-optimization program which includes automated exposure control, adjustment of the mA and/or kV according to patient size and/or use of iterative reconstruction technique. COMPARISON:  Chest radiograph dated 05/20/2023. chest CT dated 03/05/2023. FINDINGS: Evaluation of this exam is limited in the absence of intravenous contrast and streak artifact caused by spinal hardware. Cardiovascular: There is moderate cardiomegaly. Small pericardial effusion. There is mild atherosclerotic calcification of the thoracic aorta. No aneurysmal dilatation. The central pulmonary arteries are grossly unremarkable on this noncontrast CT. Mediastinum/Nodes: No obvious hilar or mediastinal adenopathy. Evaluation however is limited due to factors above. The esophagus is grossly unremarkable. Lungs/Pleura: There is near complete consolidation of the right lower lobe with air bronchogram and volume loss. Bilateral pleural effusions, right greater than left, and similar to prior CT. There are bibasilar atelectasis. No pneumothorax. The central airways remain patent. Upper Abdomen: No acute abnormality. Musculoskeletal: There is diffuse subcutaneous edema and anasarca. Osteopenia with degenerative changes and posterior spinal fusion hardware. No acute osseous pathology. IMPRESSION: 1. Near complete consolidation of the right lower lobe may represent atelectasis, or pneumonia. This has progressed since the prior CT. Continued  follow-up to resolution recommended. 2. Bilateral pleural effusions, right greater than left, and similar to prior CT. 3. Moderate cardiomegaly. 4.  Aortic Atherosclerosis (ICD10-I70.0). Electronically Signed   By: Elgie Collard M.D.   On: 05/20/2023 16:57   CT Head Wo Contrast Result Date: 05/20/2023 CLINICAL DATA:  Headache with worsening frequency or severity. EXAM: CT HEAD WITHOUT CONTRAST TECHNIQUE: Contiguous axial images were obtained from the base of the skull through the vertex without intravenous contrast. RADIATION DOSE REDUCTION: This exam was performed according to the departmental dose-optimization program which includes automated exposure control, adjustment of the mA and/or kV according to patient size and/or use of iterative reconstruction technique. COMPARISON:  05/14/2023 FINDINGS: Brain: No acute finding to explain worsening headache. No focal abnormality affects the brainstem or cerebellum. 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 No sign of acute infarction, mass lesion, hemorrhage, hydrocephalus or extra-axial collection. Vascular: No abnormal vascular finding. Skull: Negative Sinuses/Orbits: Clear/normal Other: None IMPRESSION: 1. No acute finding  to explain worsening headache. 2. 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 Electronically Signed   By: Paulina Fusi M.D.   On: 05/20/2023 16:30   DG Chest 2 View Result Date: 05/20/2023 CLINICAL DATA:  Hypoxia. EXAM: CHEST - 2 VIEW COMPARISON:  05/14/2023 FINDINGS: Enlarged cardiac silhouette. Bilateral moderate-sized pleural effusions. Pulmonary vascular congestion. No pneumothorax. Calcified aorta. Thoracic spine fixation hardware. No acute osseous abnormalities. IMPRESSION: Findings consistent with CHF.  Electronically Signed   By: Layla Maw M.D.   On: 05/20/2023 14:59        Scheduled Meds:  amLODipine  10 mg Oral Once per day on Sunday Tuesday Thursday Saturday   amLODipine  5 mg Oral Once per day on Monday Wednesday Friday   carvedilol  12.5 mg Oral BID WC   Chlorhexidine Gluconate Cloth  6 each Topical Q0600   Chlorhexidine Gluconate Cloth  6 each Topical Q0600   cloNIDine  0.1 mg Oral BID   heparin  5,000 Units Subcutaneous Q8H   levETIRAcetam  1,000 mg Oral QPM   pantoprazole  40 mg Oral Daily   senna-docusate  2 tablet Oral QHS   sevelamer carbonate  800 mg Oral TID WC   Continuous Infusions:  [START ON 05/22/2023] levofloxacin (LEVAQUIN) IV       LOS: 1 day    Time spent: 35 minutes    Shenaya Lebo Hoover Brunette, DO [facility]  If 7PM-7AM, please contact night-coverage [email or web address] 05/21/2023, 12:17 PM

## 2023-05-22 DIAGNOSIS — E8770 Fluid overload, unspecified: Secondary | ICD-10-CM | POA: Diagnosis not present

## 2023-05-22 LAB — BASIC METABOLIC PANEL
Anion gap: 15 (ref 5–15)
BUN: 48 mg/dL — ABNORMAL HIGH (ref 6–20)
CO2: 24 mmol/L (ref 22–32)
Calcium: 8.9 mg/dL (ref 8.9–10.3)
Chloride: 95 mmol/L — ABNORMAL LOW (ref 98–111)
Creatinine, Ser: 4.72 mg/dL — ABNORMAL HIGH (ref 0.44–1.00)
GFR, Estimated: 10 mL/min — ABNORMAL LOW (ref >60)
Glucose, Bld: 91 mg/dL (ref 70–99)
Potassium: 4.4 mmol/L (ref 3.5–5.1)
Sodium: 134 mmol/L — ABNORMAL LOW (ref 135–145)

## 2023-05-22 LAB — CBC
HCT: 36 % (ref 36.0–46.0)
Hemoglobin: 10.9 g/dL — ABNORMAL LOW (ref 12.0–15.0)
MCH: 28.3 pg (ref 26.0–34.0)
MCHC: 30.3 g/dL (ref 30.0–36.0)
MCV: 93.5 fL (ref 80.0–100.0)
Platelets: 195 10*3/uL (ref 150–400)
RBC: 3.85 MIL/uL — ABNORMAL LOW (ref 3.87–5.11)
RDW: 18.4 % — ABNORMAL HIGH (ref 11.5–15.5)
WBC: 5.9 10*3/uL (ref 4.0–10.5)
nRBC: 0 % (ref 0.0–0.2)

## 2023-05-22 LAB — MAGNESIUM: Magnesium: 2 mg/dL (ref 1.7–2.4)

## 2023-05-22 MED ORDER — CALCITRIOL 0.25 MCG PO CAPS
1.0000 ug | ORAL_CAPSULE | ORAL | Status: DC
  Administered 2023-05-22 – 2023-05-24 (×2): 1 ug via ORAL
  Filled 2023-05-22 (×2): qty 4

## 2023-05-22 MED ORDER — CHLORHEXIDINE GLUCONATE CLOTH 2 % EX PADS: 6.0000 | MEDICATED_PAD | Freq: Every day | CUTANEOUS | Status: DC

## 2023-05-22 MED ORDER — FUROSEMIDE 40 MG PO TABS
80.0000 mg | ORAL_TABLET | Freq: Once | ORAL | Status: AC
  Administered 2023-05-23: 80 mg via ORAL
  Filled 2023-05-22: qty 2

## 2023-05-22 NOTE — Progress Notes (Signed)
   HEMODIALYSIS TREATMENT NOTE:  3.5 hour heparin-free treatment completed using left upper arm AVF (15g/antegrade). Goal met: 2.8 liters removed without interruption in UF.  All blood was returned.  Hemostasis was achieved in 10 minutes.   Post-HD:  05/22/23 1813  Vitals  Temp 98 F (36.7 C)  Temp Source Axillary  BP (!) 143/72  MAP (mmHg) 90  BP Location Right Arm  BP Method Automatic  Patient Position (if appropriate) Lying  Pulse Rate 72  Pulse Rate Source Monitor  ECG Heart Rate 74  Resp 14  Oxygen Therapy  SpO2 100 %  O2 Device Nasal Cannula  O2 Flow Rate (L/min) 2 L/min  Post Treatment  Dialyzer Clearance Lightly streaked  Hemodialysis Intake (mL) 0 mL  Liters Processed 68.7  Fluid Removed (mL) 2800 mL  Tolerated HD Treatment Yes  Post-Hemodialysis Comments Goal met  AVG/AVF Arterial Site Held (minutes) 5 minutes  AVG/AVF Venous Site Held (minutes) 5 minutes  Fistula / Graft Left Upper arm Arteriovenous fistula  No placement date or time found.   Placed prior to admission: Yes  Orientation: Left  Access Location: Upper arm  Access Type: Arteriovenous fistula  Fistula / Graft Assessment Thrill;Bruit  Status Patent    Arman Filter, RN AP [facility]

## 2023-05-22 NOTE — Progress Notes (Signed)
[name] kindly refuses CHG baths due to skin irritation. She did agree to a Dentist.

## 2023-05-22 NOTE — Consult Note (Signed)
[facility] Renal Consultation Note  Requesting MD: Maurilio Lovely, DO Indication for Consultation:  ESRD  Chief complaint: headache   HPI:  [name] is a 57 y.o. female with a history of ESRD (MWF at [facility]), HTN who presented to the hospital with headache and was found to have BP over 205/115.  Nephrology was called and per charting she got HD on 12/24 with UF of 2.2 kg.  Note that she was recently admitted with Legionella PNA 12/6-12/9 and treated with levaquin for the same.  She had near complete consolidation of the right lower lobe on admission CT here.  Antibiotics were continued.  Nephrology is consulted for assistance with management.  Per outpatient HD unit she is noncompliant with HD and frequently signs off or misses treatment (and is only ordered 3 hours).  Due to her small size, there are also UF restrictions per RN.  We discussed noncompliance - patient states that once she signed off because she "couldn't take it anymore" but did not elaborate.  We discussed goals of care discussions would be appropriate if she is routinely not attending full HD treatments.     PMHx:   Past Medical History:  Diagnosis Date   Abnormal uterine bleeding (AUB) 06/15/2014   Cancer (HCC)    uterine   High blood pressure    Paraplegia (lower)    Renal disorder    Seizure disorder (HCC)    Seizures (HCC)    Suprapubic catheter (HCC)    Urinary tract infection     Past Surgical History:  Procedure Laterality Date   APPLICATION OF WOUND VAC Right 09/13/2021   (approximately 1-11mos ago) pressure sore on right hip   BACK SURGERY     Pt stated "before 2000"   BIOPSY  12/03/2022   Procedure: BIOPSY;  Surgeon: Dolores Frame, MD;  [facility]: [facility];  Service: Gastroenterology;;   ESOPHAGOGASTRODUODENOSCOPY N/A 09/20/2015   Procedure: ESOPHAGOGASTRODUODENOSCOPY (EGD);  Surgeon: Malissa Hippo, MD;  Location: [facility];  Service: Endoscopy;   Laterality: N/A;  730   ESOPHAGOGASTRODUODENOSCOPY (EGD) WITH PROPOFOL N/A 12/03/2022   Procedure: ESOPHAGOGASTRODUODENOSCOPY (EGD) WITH PROPOFOL;  Surgeon: Dolores Frame, MD;  Location: [facility];  Service: Gastroenterology;  Laterality: N/A;  1:15 pm, asa 3, pt knows to arrive at 10:30  dialysis pt, M,W & F   IR CATHETER TUBE CHANGE  04/02/2018   PERCUTANEOUS ENDOSCOPIC GASTROSTOMY (PEG) REMOVAL N/A 09/20/2015   Procedure: PERCUTANEOUS ENDOSCOPIC GASTROSTOMY (PEG) REMOVAL;  Surgeon: Malissa Hippo, MD;  [facility];  Service: Endoscopy;  Laterality: N/A;   TEE WITHOUT CARDIOVERSION N/A 11/11/2022   Procedure: TRANSESOPHAGEAL ECHOCARDIOGRAM (TEE);  Surgeon: Pricilla Riffle, MD;  Location: [facility];  Service: Cardiovascular;  Laterality: N/A;    Family Hx:  Family History  Problem Relation Age of Onset   Cancer Mother    Hypertension Mother    Cancer Sister        breast and then spread everywhere.   Diabetes Paternal Grandmother    Hypertension Paternal Grandmother   Mother had ESRD  Social History:  reports that she has never smoked. She has never used smokeless tobacco. She reports current alcohol use. She reports that she does not use drugs.  Allergies:  Allergies  Allergen Reactions   Benadryl [Diphenhydramine Hcl (Sleep)] Hives   Daptomycin Hives   Linezolid Other (See Comments)    Patient self-discontinued treatment due to GI intolerance. Taking it along with moxifloxacin  Moxifloxacin Other (See Comments)    Patient self-discontinued treatment due to GI intolerance. Taking it along with linezolid   Quinine Derivatives Other (See Comments)    Alters mental status   Vancomycin Other (See Comments)    Pt is tolerating this medication at HD   Azithromycin Itching and Rash   Tetracycline Itching    Able to tolerate Doxycycline.    Zosyn [Piperacillin Sod-Tazobactam So] Rash    Medications: Prior to Admission medications   Medication Sig Start  Date End Date Taking? Authorizing Provider  acetaminophen (TYLENOL) 500 MG tablet Take 1,000 mg by mouth every 6 (six) hours as needed for moderate pain.   Yes [provider]  albuterol (PROVENTIL) (2.5 MG/3ML) 0.083% nebulizer solution Take 3 mLs (2.5 mg total) by nebulization every 4 (four) hours as needed for wheezing or shortness of breath. 04/15/23 04/14/24 Yes Emokpae, Courage, MD  amLODipine (NORVASC) 5 MG tablet Take 1 tablet (5 mg total) by mouth daily. Take 1 tablet on Monday,Wednesday and Friday then take 2 tablets on Tuesday,Thursday,Saturday and Sunday. 04/15/23  Yes Shon Hale, MD  carvedilol (COREG) 12.5 MG tablet Take 1 tablet (12.5 mg total) by mouth 2 (two) times daily with a meal. 04/15/23 08/03/23 Yes Emokpae, Courage, MD  cloNIDine (CATAPRES) 0.1 MG tablet Take 1 tablet (0.1 mg total) by mouth 2 (two) times daily. 04/15/23  Yes Emokpae, Courage, MD  cyclobenzaprine (FLEXERIL) 5 MG tablet Take 5 mg by mouth 3 (three) times daily as needed for muscle spasms. 04/11/23  Yes [provider]  furosemide (LASIX) 20 MG tablet Take 20 mg by mouth daily. 05/13/23  Yes [provider]  irbesartan (AVAPRO) 150 MG tablet Take 150 mg by mouth daily. 05/13/23  Yes [provider]  levETIRAcetam (KEPPRA) 500 MG tablet Take 2 tablets (1,000 mg total) by mouth every evening. Take an additional tablet on Monday,Wednesday and Friday evening after Dialysis 04/15/23  Yes Emokpae, Courage, MD  lidocaine-prilocaine (EMLA) cream Apply 1 Application topically 3 (three) times a week. 06/04/22  Yes [provider]  naloxone (NARCAN) nasal spray 4 mg/0.1 mL Place 0.4 mg into the nose once. 10/03/22  Yes [provider]  Nebulizers (COMPRESSOR/NEBULIZER) MISC 1 Units by Does not apply route as needed. 04/15/23  Yes Emokpae, Courage, MD  pantoprazole (PROTONIX) 40 MG tablet Take 40 mg by mouth daily.   Yes [provider]  senna-docusate (SENOKOT-S)  8.6-50 MG tablet Take 2 tablets by mouth at bedtime. 11/12/22  Yes Emokpae, Courage, MD  sevelamer carbonate (RENVELA) 800 MG tablet Take 800 mg by mouth 3 (three) times daily. 12/11/22  Yes [provider]    I have reviewed the patient's current and reported prior to admission medications.  Labs:     Latest Ref Rng & Units 05/22/2023    4:47 AM 05/21/2023    4:50 AM 05/20/2023    4:45 PM  BMP  Glucose 70 - 99 mg/dL 91  77  94   BUN 6 - 20 mg/dL 48  29  50   Creatinine 0.44 - 1.00 mg/dL 1.61  0.96  0.45   Sodium 135 - 145 mmol/L 134  134  137   Potassium 3.5 - 5.1 mmol/L 4.4  3.6  4.3   Chloride 98 - 111 mmol/L 95  96  101   CO2 22 - 32 mmol/L 24  26  24    Calcium 8.9 - 10.3 mg/dL 8.9  8.4  8.7     Urinalysis  Component Value Date/Time   COLORURINE YELLOW 05/14/2023 0037   APPEARANCEUR CLOUDY (A) 05/14/2023 0037   LABSPEC 1.012 05/14/2023 0037   PHURINE 8.0 05/14/2023 0037   GLUCOSEU >=500 (A) 05/14/2023 0037   HGBUR SMALL (A) 05/14/2023 0037   BILIRUBINUR NEGATIVE 05/14/2023 0037   KETONESUR NEGATIVE 05/14/2023 0037   PROTEINUR >=300 (A) 05/14/2023 0037   UROBILINOGEN 0.2 03/31/2015 1200   NITRITE NEGATIVE 05/14/2023 0037   LEUKOCYTESUR LARGE (A) 05/14/2023 0037     ROS:  Pertinent items noted in HPI and remainder of comprehensive ROS otherwise negative.  Physical Exam: Vitals:   05/22/23 0856 05/22/23 0858  BP: (!) 177/91   Pulse:    Resp:    Temp:    SpO2:  93%     General: adult female thin frame, chronically ill appearing   HEENT: NCAT Eyes: EOMI sclera anicteric  Neck: supple trachea midline  Heart: S1S2 no rub  Lungs: reduced breath sounds right base; on oxygen per nasal cannula - was taken off for repositioning for a bath and we are getting it back on Abdomen: soft/nt/nd Extremities: no pitting edema; no cyanosis or clubbing Skin: no rash on extremities exposed Neuro: alert and oriented; provides hx and follows commands Psych: normal  mood and affect  Gu suprapubic catheter Avf with thrill   Outpatient HD orders:  [facility]  MWF  No heparin  3 hours Noncompliant Frequently signs off or misses EDW 43 kg  BF 400 DF 500 3K/2.5 calcium  Calcitriol 1 mcg each tx Mircera 175 mcg every 2 weeks - due on 12/30 Left AVF    Assessment/Plan:  # HTN emergency  - HD to optimize volume status - Continue current regimen   - If lasix is to be continued outpatient would use 80 mg PO on non-HD days; doubtful that 20 mg daily is doing anything if this truly is her dose   # ESRD - HD per MWF schedule usually   - HD today off schedule and again on 12/28 (Sat) - noncompliance with HD complicates her care  # Legionella PNA with acute hypoxic respiratory failure - oxygen per primary team  - abx per primary team  - optimize volume status with HD as above  # Fluid volume overload  - Optimize volume status with HD  - Appears that she lost weight with hr acute illness earlier this month and I see her EDW was lowered a bit - Noncompliance with HD complicates her care per outpatient HD unit   # Anemia of ESRD - mircera would have been due 12/30  - Will plan for ESA should she remain inpatient    # Metabolic bone disease  - continue renvela - will resume calcitriol - HD is TTS this week   Thank you for the consult.  Please do not hesitate to contact me with any questions regarding our patient  Estanislado Emms 05/22/2023, 9:46 AM

## 2023-05-22 NOTE — Progress Notes (Deleted)
   05/22/23 0428  Provider Notification  Provider Name/Title J. MANSY  Date Provider Notified 05/22/23  Time Provider Notified [phone]  Method of Notification Page  Notification Reason Other (Comment) (bp 190/102. provided PRN bp med/ low urine output of 150.)  Provider response No new orders  Date of Provider Response 05/22/23  Time of Provider Response 0430   Pt bp is 190/102. Low urine output overnight ( ). Notified Hospitalist [facility] no new orders at this time.

## 2023-05-22 NOTE — Progress Notes (Signed)
PROGRESS NOTE    [name]  DGU:[id number] DOB: Sep 21, 1965 DOA: 05/20/2023 PCP: Patient, No Pcp Per   Brief Narrative:    [name] is a 57 y.o. female with medical history significant for paraplegia, decubitus ulcer, ESRD, hypertension. Patient presented to the ED with reports of elevated blood pressure over 205/115, back pain, and headache.  She is on HD Monday Wednesday Friday, last week she missed 1 session- 12/20, but was compliant with the rest, her last HD was yesterday.   Recent hospitalization 12/6 to 12/8 for acute hypoxic respiratory failure secondary to multilobar Legionella pneumonia with acute pulmonary edema.  She was treated with Levaquin as recommended by ID.   Assessment & Plan:   Principal Problem:   Volume overload Active Problems:   Legionella pneumonia (HCC)   Decubitus ulcer of buttock   Suprapubic catheter (HCC)   ESRD (end stage renal disease) (HCC)   Seizure disorder (HCC)   Paraplegia (HCC)   Essential hypertension   Dyspnea  Assessment and Plan:  Volume overload Volume overload in setting of ESRD and combined systolic and diastolic congestive heart failure.  Recent echo 03/2023-EF of 45 to 50% with grade 2 DD.  BNP elevated >4,500, CT chest without contrast-right greater than left bilateral pleural effusions similar to prior CT, also atelectasis versus pneumonia. -Hemodialysis performed 12/24, further dialysis per nephrology pending for today  Legionella pneumonia [facility]) Recent hospitalization, 12/6- 12/9 for Legionella pneumonia, treated with Levaquin, who was discharged to continue 500 mg every other day for 4 doses.  She reports chronic unchanged dyspnea on exertion, improving cough.  She is afebrile without leukocytosis.  Not septic.  Sats currently 99% on room air.   - But CT today -  Near complete consolidation of the right lower lobe may represent atelectasis, or pneumonia. This has progressed since the prior CT. -Procalcitonin  elevated at 0.54 - Possibly Radiologic improvement likely lagging behind clinical improvement, with some atelectasis, but with elevated procalcitonin will continue IV Levaquin for now.  -Low threshold to discontinue antibiotic, plan to continue for now  Decubitus ulcer of buttock Chronic and stable.  ESRD (end stage renal disease) (HCC) Initial session of HD 12/20, schedule Monday Wednesday Friday, otherwise compliant with HD.  Blood pressure elevated systolic up to 190s, concern for volume overload. - EDP talked to nephrology, plan for dialysis  Seizure disorder [facility]) Resume Keppra  Essential hypertension Blood pressure elevated up to 194/96, in the setting of volume overload -Resume clonidine, Norvasc, carvedilol -As needed labetalol 10mg  for systolic greater than 190  Paraplegia (HCC) Paraplegic with decubitus ulcer, suprapubic cath.  DVT prophylaxis:Heparin Code Status: Full Family Communication: Discussed with daughter on phone 12/25 Disposition Plan:  Status is: Inpatient Remains inpatient appropriate because: Need for IV medications and inpatient dialysis.   Skin Assessment:  I have examined the patient's skin and I agree with the wound assessment as performed by the wound care RN as outlined below:  Pressure Injury 02/26/22 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 (Active)  02/26/22 0120  Location: Ischial tuberosity  Location Orientation: Left  Staging: Stage 4 - Full thickness tissue loss with exposed bone, tendon or muscle.  Wound Description (Comments):   Present on Admission: Yes  Dressing Type Gauze (Comment) 05/21/23 1515     Pressure Injury 02/26/22 Ischial tuberosity Right Stage 4 - Full thickness tissue loss with exposed bone, tendon or muscle. (Active)  02/26/22 0120  Location: Ischial tuberosity  Location Orientation: Right  Staging: Stage 4 - Full thickness tissue loss with exposed bone,  tendon or muscle.  Wound Description (Comments):   Present on Admission: Yes  Dressing Type Gauze (Comment) 05/21/23 1515    Consultants:  Nephrology  Procedures:  None  Antimicrobials:  None   Subjective: Patient seen and evaluated today with no new acute complaints or concerns. No acute concerns or events noted overnight.  She continues to have oxygen requirement and ongoing shortness of breath despite dialysis yesterday.  Objective: Vitals:   05/22/23 0654 05/22/23 0856 05/22/23 0858 05/22/23 1045  BP: (!) 171/88 (!) 177/91  (!) 177/89  Pulse: 68     Resp:      Temp:      TempSrc:      SpO2:   93%   Weight:      Height:        Intake/Output Summary (Last 24 hours) at 05/22/2023 1047 Last data filed at 05/21/2023 2039 Gross per 24 hour  Intake 220 ml  Output 150 ml  Net 70 ml   Filed Weights   05/20/23 2200 05/21/23 0430 05/22/23 0419  Weight: 42.2 kg 43.2 kg 43.9 kg    Examination:  General exam: Appears calm and comfortable  Respiratory system: Clear to auscultation. Respiratory effort normal.  Nasal cannula oxygen. Cardiovascular system: S1 & S2 heard, RRR.  Gastrointestinal system: Abdomen is soft Central nervous system: Alert and awake Extremities: No edema Skin: No significant lesions noted Psychiatry: Flat affect.    Data Reviewed: I have personally reviewed following labs and imaging studies  CBC: Recent Labs  Lab 05/20/23 1645 05/21/23 0450 05/22/23 0447  WBC 7.5 6.3 5.9  NEUTROABS 5.5  --   --   HGB 12.1 10.5* 10.9*  HCT 40.6 35.3* 36.0  MCV 95.1 93.4 93.5  PLT 178 165 195   Basic Metabolic Panel: Recent Labs  Lab 05/20/23 1645 05/21/23 0450 05/22/23 0447  NA 137 134* 134*  K 4.3 3.6 4.4  CL 101 96* 95*  CO2 24 26 24   GLUCOSE 94 77 91  BUN 50* 29* 48*  CREATININE 5.34* 3.77* 4.72*  CALCIUM 8.7* 8.4* 8.9  MG  --   --  2.0   GFR: Estimated Creatinine Clearance: 9.3 mL/min (A) (by C-G formula based on SCr of 4.72 mg/dL  (H)). Liver Function Tests: Recent Labs  Lab 05/20/23 1645  AST 17  ALT 13  ALKPHOS 90  BILITOT 0.9  PROT 8.1  ALBUMIN 3.2*   No results for input(s): "LIPASE", "AMYLASE" in the last 168 hours. No results for input(s): "AMMONIA" in the last 168 hours. Coagulation Profile: No results for input(s): "INR", "PROTIME" in the last 168 hours. Cardiac Enzymes: No results for input(s): "CKTOTAL", "CKMB", "CKMBINDEX", "TROPONINI" in the last 168 hours. BNP (last 3 results) No results for input(s): "PROBNP" in the last 8760 hours. HbA1C: No results for input(s): "HGBA1C" in the last 72 hours. CBG: Recent Labs  Lab 05/21/23 1122  GLUCAP 104*   Lipid Profile: No results for input(s): "CHOL", "HDL", "LDLCALC", "TRIG", "CHOLHDL", "LDLDIRECT" in the last 72 hours. Thyroid Function Tests: No results for input(s): "TSH", "T4TOTAL", "FREET4", "T3FREE", "THYROIDAB" in the last 72 hours. Anemia Panel: No results for input(s): "VITAMINB12", "FOLATE", "FERRITIN", "TIBC", "IRON", "RETICCTPCT" in the last 72 hours. Sepsis Labs: Recent Labs  Lab 05/20/23 1645  PROCALCITON 0.54    Recent Results (from the past 240 hours)  Blood culture (routine x 2)     Status:  None (Preliminary result)   Collection Time: 05/20/23  6:21 PM   Specimen: BLOOD  Result Value Ref Range Status   Specimen Description BLOOD RIGHT ANTECUBITAL  Final   Special Requests   Final    BOTTLES DRAWN AEROBIC AND ANAEROBIC Blood Culture adequate volume   Culture   Final    NO GROWTH 2 DAYS Performed at [facility], [address] [place], [place] [address]    Report Status PENDING  Incomplete  Blood culture (routine x 2)     Status: None (Preliminary result)   Collection Time: 05/20/23  6:21 PM   Specimen: BLOOD  Result Value Ref Range Status   Specimen Description BLOOD RIGHT ANTECUBITAL  Final   Special Requests   Final    BOTTLES DRAWN AEROBIC AND ANAEROBIC Blood Culture adequate volume   Culture   Final    NO  GROWTH 2 DAYS Performed at [facility], [address] [place], [place] [address]    Report Status PENDING  Incomplete         Radiology Studies: CT Chest Wo Contrast Result Date: 05/20/2023 CLINICAL DATA:  Respiratory distress. EXAM: CT CHEST WITHOUT CONTRAST TECHNIQUE: Multidetector CT imaging of the chest was performed following the standard protocol without IV contrast. RADIATION DOSE REDUCTION: This exam was performed according to the departmental dose-optimization program which includes automated exposure control, adjustment of the mA and/or kV according to patient size and/or use of iterative reconstruction technique. COMPARISON:  Chest radiograph dated 05/20/2023. chest CT dated 03/05/2023. FINDINGS: Evaluation of this exam is limited in the absence of intravenous contrast and streak artifact caused by spinal hardware. Cardiovascular: There is moderate cardiomegaly. Small pericardial effusion. There is mild atherosclerotic calcification of the thoracic aorta. No aneurysmal dilatation. The central pulmonary arteries are grossly unremarkable on this noncontrast CT. Mediastinum/Nodes: No obvious hilar or mediastinal adenopathy. Evaluation however is limited due to factors above. The esophagus is grossly unremarkable. Lungs/Pleura: There is near complete consolidation of the right lower lobe with air bronchogram and volume loss. Bilateral pleural effusions, right greater than left, and similar to prior CT. There are bibasilar atelectasis. No pneumothorax. The central airways remain patent. Upper Abdomen: No acute abnormality. Musculoskeletal: There is diffuse subcutaneous edema and anasarca. Osteopenia with degenerative changes and posterior spinal fusion hardware. No acute osseous pathology. IMPRESSION: 1. Near complete consolidation of the right lower lobe may represent atelectasis, or pneumonia. This has progressed since the prior CT. Continued follow-up to resolution recommended. 2. Bilateral  pleural effusions, right greater than left, and similar to prior CT. 3. Moderate cardiomegaly. 4.  Aortic Atherosclerosis (ICD10-I70.0). Electronically Signed   By: Elgie Collard M.D.   On: 05/20/2023 16:57   CT Head Wo Contrast Result Date: 05/20/2023 CLINICAL DATA:  Headache with worsening frequency or severity. EXAM: CT HEAD WITHOUT CONTRAST TECHNIQUE: Contiguous axial images were obtained from the base of the skull through the vertex without intravenous contrast. RADIATION DOSE REDUCTION: This exam was performed according to the departmental dose-optimization program which includes automated exposure control, adjustment of the mA and/or kV according to patient size and/or use of iterative reconstruction technique. COMPARISON:  05/14/2023 FINDINGS: Brain: No acute finding to explain worsening headache. No focal abnormality affects the brainstem or cerebellum. 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 No sign of acute infarction, mass lesion, hemorrhage, hydrocephalus or extra-axial collection. Vascular: No abnormal vascular finding. Skull: Negative Sinuses/Orbits: Clear/normal Other: None  IMPRESSION: 1. No acute finding to explain worsening headache. 2. 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 Electronically Signed   By: Paulina Fusi M.D.   On: 05/20/2023 16:30   DG Chest 2 View Result Date: 05/20/2023 CLINICAL DATA:  Hypoxia. EXAM: CHEST - 2 VIEW COMPARISON:  05/14/2023 FINDINGS: Enlarged cardiac silhouette. Bilateral moderate-sized pleural effusions. Pulmonary vascular congestion. No pneumothorax. Calcified aorta. Thoracic spine fixation hardware. No acute osseous abnormalities. IMPRESSION: Findings consistent with CHF. Electronically Signed   By: Layla Maw M.D.    On: 05/20/2023 14:59        Scheduled Meds:  amLODipine  10 mg Oral Once per day on Sunday Tuesday Thursday Saturday   amLODipine  5 mg Oral Once per day on Monday Wednesday Friday   calcitRIOL  1 mcg Oral Q T,Th,Sa-HD   carvedilol  12.5 mg Oral BID WC   Chlorhexidine Gluconate Cloth  6 each Topical Q0600   cloNIDine  0.1 mg Oral BID   [START ON 05/23/2023] furosemide  80 mg Oral Once   heparin  5,000 Units Subcutaneous Q8H   levETIRAcetam  1,000 mg Oral QPM   pantoprazole  40 mg Oral Daily   senna-docusate  2 tablet Oral QHS   sevelamer carbonate  800 mg Oral TID WC   Continuous Infusions:  levofloxacin (LEVAQUIN) IV       LOS: 2 days    Time spent: 35 minutes    Jenavi Beedle Hoover Brunette, DO [facility]  If 7PM-7AM, please contact night-coverage [email or web address] 05/22/2023, 10:47 AM

## 2023-05-22 NOTE — Progress Notes (Signed)
   05/22/23 0526  Provider Notification  Provider Name/Title J. SEGARS  Date Provider Notified 05/22/23  Time Provider Notified [phone]  Method of Notification Page  Notification Reason Other (Comment) (bp elevated, low urine output)  Provider response No new orders  Date of Provider Response 05/22/23  Time of Provider Response 0526   Pt blood pressure 190/102. Low urine output ( ).

## 2023-05-23 DIAGNOSIS — E8770 Fluid overload, unspecified: Secondary | ICD-10-CM | POA: Diagnosis not present

## 2023-05-23 LAB — BASIC METABOLIC PANEL
Anion gap: 10 (ref 5–15)
BUN: 24 mg/dL — ABNORMAL HIGH (ref 6–20)
CO2: 28 mmol/L (ref 22–32)
Calcium: 8.6 mg/dL — ABNORMAL LOW (ref 8.9–10.3)
Chloride: 94 mmol/L — ABNORMAL LOW (ref 98–111)
Creatinine, Ser: 2.88 mg/dL — ABNORMAL HIGH (ref 0.44–1.00)
GFR, Estimated: 19 mL/min — ABNORMAL LOW (ref >60)
Glucose, Bld: 94 mg/dL (ref 70–99)
Potassium: 4.9 mmol/L (ref 3.5–5.1)
Sodium: 132 mmol/L — ABNORMAL LOW (ref 135–145)

## 2023-05-23 LAB — MAGNESIUM: Magnesium: 1.9 mg/dL (ref 1.7–2.4)

## 2023-05-23 MED ORDER — CHLORHEXIDINE GLUCONATE CLOTH 2 % EX PADS: 6.0000 | MEDICATED_PAD | Freq: Every day | CUTANEOUS | Status: DC

## 2023-05-23 NOTE — Progress Notes (Signed)
Removed [place] from patient earlier in the shift. On recheck, patients O2 was 97% and in no observed distress. Informed provider via secure chat.

## 2023-05-23 NOTE — Plan of Care (Signed)
  Problem: Health Behavior/Discharge Planning: Goal: Ability to manage health-related needs will improve Outcome: Progressing   Problem: Clinical Measurements: Goal: Ability to maintain clinical measurements within normal limits will improve Outcome: Progressing Goal: Will remain free from infection Outcome: Progressing Goal: Diagnostic test results will improve Outcome: Progressing Goal: Respiratory complications will improve Outcome: Progressing Goal: Cardiovascular complication will be avoided Outcome: Progressing   Problem: Activity: Goal: Risk for activity intolerance will decrease Outcome: Progressing   Problem: Nutrition: Goal: Adequate nutrition will be maintained Outcome: Progressing   Problem: Coping: Goal: Level of anxiety will decrease Outcome: Progressing   Problem: Elimination: Goal: Will not experience complications related to bowel motility Outcome: Progressing Goal: Will not experience complications related to urinary retention Outcome: Progressing   Problem: Pain Management: Goal: General experience of comfort will improve Outcome: Progressing   Problem: Safety: Goal: Ability to remain free from injury will improve Outcome: Progressing   Problem: Skin Integrity: Goal: Risk for impaired skin integrity will decrease Outcome: Progressing   Problem: Education: Goal: Ability to demonstrate management of disease process will improve Outcome: Progressing Goal: Ability to verbalize understanding of medication therapies will improve Outcome: Progressing Goal: Individualized Educational Video(s) Outcome: Progressing   Problem: Activity: Goal: Capacity to carry out activities will improve Outcome: Progressing   Problem: Cardiac: Goal: Ability to achieve and maintain adequate cardiopulmonary perfusion will improve Outcome: Progressing   Problem: Education: Goal: Ability to demonstrate management of disease process will improve Outcome:  Progressing Goal: Ability to verbalize understanding of medication therapies will improve Outcome: Progressing Goal: Individualized Educational Video(s) Outcome: Progressing   Problem: Activity: Goal: Capacity to carry out activities will improve Outcome: Progressing   Problem: Cardiac: Goal: Ability to achieve and maintain adequate cardiopulmonary perfusion will improve Outcome: Progressing   Problem: Education: Goal: Ability to demonstrate management of disease process will improve Outcome: Progressing Goal: Ability to verbalize understanding of medication therapies will improve Outcome: Progressing Goal: Individualized Educational Video(s) Outcome: Progressing   Problem: Activity: Goal: Capacity to carry out activities will improve Outcome: Progressing   Problem: Cardiac: Goal: Ability to achieve and maintain adequate cardiopulmonary perfusion will improve Outcome: Progressing

## 2023-05-23 NOTE — Progress Notes (Signed)
PROGRESS NOTE    [name]  ZOX:[id number] DOB: Sep 04, 1965 DOA: 05/20/2023 PCP: Patient, No Pcp Per   Brief Narrative:    [name] is a 57 y.o. female with medical history significant for paraplegia, decubitus ulcer, ESRD, hypertension. Patient presented to the ED with reports of elevated blood pressure over 205/115, back pain, and headache.  She is on HD Monday Wednesday Friday, last week she missed 1 session- 12/20, but was compliant with the rest, her last HD was yesterday.   Recent hospitalization 12/6 to 12/8 for acute hypoxic respiratory failure secondary to multilobar Legionella pneumonia with acute pulmonary edema.  She was treated with Levaquin as recommended by ID.   Assessment & Plan:   Principal Problem:   Volume overload Active Problems:   Legionella pneumonia (HCC)   Decubitus ulcer of buttock   Suprapubic catheter (HCC)   ESRD (end stage renal disease) (HCC)   Seizure disorder (HCC)   Paraplegia (HCC)   Essential hypertension   Dyspnea  Assessment and Plan:  Acute hypoxemic respiratory failure secondary to volume overload Volume overload in setting of ESRD and combined systolic and diastolic congestive heart failure.  Recent echo 03/2023-EF of 45 to 50% with grade 2 DD.  BNP elevated >4,500, CT chest without contrast-right greater than left bilateral pleural effusions similar to prior CT, also atelectasis versus pneumonia. -Hemodialysis performed 12/24, further dialysis per nephrology for tomorrow  Legionella pneumonia [facility]) Recent hospitalization, 12/6- 12/9 for Legionella pneumonia, treated with Levaquin, who was discharged to continue 500 mg every other day for 4 doses.  She reports chronic unchanged dyspnea on exertion, improving cough.  She is afebrile without leukocytosis.  Not septic.  Sats currently 99% on room air.   - But CT today -  Near complete consolidation of the right lower lobe may represent atelectasis, or pneumonia. This has  progressed since the prior CT. -Procalcitonin elevated at 0.54 - Possibly Radiologic improvement likely lagging behind clinical improvement, with some atelectasis, but with elevated procalcitonin will continue IV Levaquin for now.  -Low threshold to discontinue antibiotic, plan to continue for now  Decubitus ulcer of buttock Chronic and stable.  ESRD (end stage renal disease) (HCC) Initial session of HD 12/20, schedule Monday Wednesday Friday, otherwise compliant with HD.  Blood pressure elevated systolic up to 190s, concern for volume overload. - EDP talked to nephrology, plan for dialysis  Seizure disorder [facility]) Resume Keppra  Essential hypertension Blood pressure elevated up to 194/96, in the setting of volume overload -Resume clonidine, Norvasc, carvedilol -As needed labetalol 10mg  for systolic greater than 190  Paraplegia (HCC) Paraplegic with decubitus ulcer, suprapubic cath.  DVT prophylaxis:Heparin Code Status: Full Family Communication: Discussed with daughter on phone 12/25; tried calling 12/27 with no response Disposition Plan:  Status is: Inpatient Remains inpatient appropriate because: Need for IV medications and inpatient dialysis.   Skin Assessment:  I have examined the patient's skin and I agree with the wound assessment as performed by the wound care RN as outlined below:  Pressure Injury 02/26/22 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 (Active)  02/26/22 0120  Location: Ischial tuberosity  Location Orientation: Left  Staging: Stage 4 - Full thickness tissue loss with exposed bone, tendon or muscle.  Wound Description (Comments):   Present on Admission: Yes  Dressing Type Gauze (Comment) 05/22/23 2300     Pressure Injury 02/26/22 Ischial tuberosity Right Stage 4 - Full thickness tissue loss with exposed bone,  tendon or muscle. (Active)  02/26/22 0120  Location: Ischial tuberosity  Location  Orientation: Right  Staging: Stage 4 - Full thickness tissue loss with exposed bone, tendon or muscle.  Wound Description (Comments):   Present on Admission: Yes  Dressing Type Gauze (Comment) 05/22/23 2300    Consultants:  Nephrology  Procedures:  None  Antimicrobials:  None   Subjective: Patient seen and evaluated today with no new acute complaints or concerns. No acute concerns or events noted overnight.  Her dyspnea is improving and dialysis further plan for a.m.  Objective: Vitals:   05/22/23 2050 05/23/23 0319 05/23/23 0956 05/23/23 1239  BP: 133/69 (!) 151/83 124/63 (!) 142/81  Pulse: 73 73 65 70  Resp: 20 20    Temp: 98.8 F (37.1 C) 98.1 F (36.7 C)  98.1 F (36.7 C)  TempSrc: Oral Oral  Oral  SpO2: 96% 96% 96% 97%  Weight:  41.1 kg    Height:        Intake/Output Summary (Last 24 hours) at 05/23/2023 1303 Last data filed at 05/23/2023 1610 Gross per 24 hour  Intake 360 ml  Output 2950 ml  Net -2590 ml   Filed Weights   05/22/23 1406 05/22/23 1813 05/23/23 0319  Weight: 44.1 kg 41.6 kg 41.1 kg    Examination:  General exam: Appears calm and comfortable  Respiratory system: Clear to auscultation. Respiratory effort normal.  Nasal cannula oxygen. Cardiovascular system: S1 & S2 heard, RRR.  Gastrointestinal system: Abdomen is soft Central nervous system: Alert and awake Extremities: No edema Skin: No significant lesions noted Psychiatry: Flat affect.    Data Reviewed: I have personally reviewed following labs and imaging studies  CBC: Recent Labs  Lab 05/20/23 1645 05/21/23 0450 05/22/23 0447  WBC 7.5 6.3 5.9  NEUTROABS 5.5  --   --   HGB 12.1 10.5* 10.9*  HCT 40.6 35.3* 36.0  MCV 95.1 93.4 93.5  PLT 178 165 195   Basic Metabolic Panel: Recent Labs  Lab 05/20/23 1645 05/21/23 0450 05/22/23 0447 05/23/23 0443  NA 137 134* 134* 132*  K 4.3 3.6 4.4 4.9  CL 101 96* 95* 94*  CO2 24 26 24 28   GLUCOSE 94 77 91 94  BUN 50* 29* 48*  24*  CREATININE 5.34* 3.77* 4.72* 2.88*  CALCIUM 8.7* 8.4* 8.9 8.6*  MG  --   --  2.0 1.9   GFR: Estimated Creatinine Clearance: 14.3 mL/min (A) (by C-G formula based on SCr of 2.88 mg/dL (H)). Liver Function Tests: Recent Labs  Lab 05/20/23 1645  AST 17  ALT 13  ALKPHOS 90  BILITOT 0.9  PROT 8.1  ALBUMIN 3.2*   No results for input(s): "LIPASE", "AMYLASE" in the last 168 hours. No results for input(s): "AMMONIA" in the last 168 hours. Coagulation Profile: No results for input(s): "INR", "PROTIME" in the last 168 hours. Cardiac Enzymes: No results for input(s): "CKTOTAL", "CKMB", "CKMBINDEX", "TROPONINI" in the last 168 hours. BNP (last 3 results) No results for input(s): "PROBNP" in the last 8760 hours. HbA1C: No results for input(s): "HGBA1C" in the last 72 hours. CBG: Recent Labs  Lab 05/21/23 1122  GLUCAP 104*   Lipid Profile: No results for input(s): "CHOL", "HDL", "LDLCALC", "TRIG", "CHOLHDL", "LDLDIRECT" in the last 72 hours. Thyroid Function Tests: No results for input(s): "TSH", "T4TOTAL", "FREET4", "T3FREE", "THYROIDAB" in the last 72 hours. Anemia Panel: No results for input(s): "VITAMINB12", "FOLATE", "FERRITIN", "TIBC", "IRON", "RETICCTPCT" in the last 72 hours. Sepsis Labs: Recent Labs  Lab 05/20/23 1645  PROCALCITON 0.54    Recent Results (from the past 240 hours)  Blood culture (routine x 2)     Status: None (Preliminary result)   Collection Time: 05/20/23  6:21 PM   Specimen: BLOOD  Result Value Ref Range Status   Specimen Description BLOOD RIGHT ANTECUBITAL  Final   Special Requests   Final    BOTTLES DRAWN AEROBIC AND ANAEROBIC Blood Culture adequate volume   Culture   Final    NO GROWTH 3 DAYS Performed at [facility], [address] [place], [place] [address]    Report Status PENDING  Incomplete  Blood culture (routine x 2)     Status: None (Preliminary result)   Collection Time: 05/20/23  6:21 PM   Specimen: BLOOD  Result Value Ref  Range Status   Specimen Description BLOOD RIGHT ANTECUBITAL  Final   Special Requests   Final    BOTTLES DRAWN AEROBIC AND ANAEROBIC Blood Culture adequate volume   Culture   Final    NO GROWTH 3 DAYS Performed at [facility], [address] [place], [place] [address]    Report Status PENDING  Incomplete         Radiology Studies: No results found.       Scheduled Meds:  amLODipine  10 mg Oral Once per day on Sunday Tuesday Thursday Saturday   amLODipine  5 mg Oral Once per day on Monday Wednesday Friday   calcitRIOL  1 mcg Oral Q T,Th,Sa-HD   carvedilol  12.5 mg Oral BID WC   Chlorhexidine Gluconate Cloth  6 each Topical Q0600   [START ON 05/24/2023] Chlorhexidine Gluconate Cloth  6 each Topical Q0600   cloNIDine  0.1 mg Oral BID   heparin  5,000 Units Subcutaneous Q8H   levETIRAcetam  1,000 mg Oral QPM   pantoprazole  40 mg Oral Daily   senna-docusate  2 tablet Oral QHS   sevelamer carbonate  800 mg Oral TID WC   Continuous Infusions:  levofloxacin (LEVAQUIN) IV Stopped (05/22/23 2300)     LOS: 3 days    Time spent: 35 minutes    Reynald Woods Hoover Brunette, DO [facility]  If 7PM-7AM, please contact night-coverage [email or web address] 05/23/2023, 1:03 PM

## 2023-05-23 NOTE — Progress Notes (Signed)
[facility] Progress Note  Name: [name] MRN: [id number] DOB: 04/05/1966  Chief Complaint:  Headache   Subjective:  She had HD on 12/26 with 2.8 kg UF.  She also had 150 mL UOP over 12/26.  Her post weight was 41.1 kg.  She and I discussed importance of compliance with full HD treatment.  States that her MD told her they were working on home oxygen for her  Review of systems:  Reports breathing is at "baseline"  Denies chest pain  Denies n/v --------- Background on consult:  [name] is a 57 y.o. female with a history of ESRD (MWF at [facility] [employer]), HTN who presented to the hospital with headache and was found to have BP over 205/115.  Nephrology was called and per charting she got HD on 12/24 with UF of 2.2 kg.  Note that she was recently admitted with Legionella PNA 12/6-12/9 and treated with levaquin for the same.  She had near complete consolidation of the right lower lobe on admission CT here.  Antibiotics were continued.  Nephrology is consulted for assistance with management.  Per outpatient HD unit she is noncompliant with HD and frequently signs off or misses treatment (and is only ordered 3 hours).  Due to her small size, there are also UF restrictions per RN.  We discussed noncompliance - patient states that once she signed off because she "couldn't take it anymore" but did not elaborate.  We discussed goals of care discussions would be appropriate if she is routinely not attending full HD treatments.       Intake/Output Summary (Last 24 hours) at 05/23/2023 1156 Last data filed at 05/23/2023 4098 Gross per 24 hour  Intake 600 ml  Output 2950 ml  Net -2350 ml    Vitals:  Vitals:   05/22/23 1813 05/22/23 2050 05/23/23 0319 05/23/23 0956  BP: (!) 143/72 133/69 (!) 151/83 124/63  Pulse: 72 73 73 65  Resp: 14 20 20    Temp: 98 F (36.7 C) 98.8 F (37.1 C) 98.1 F (36.7 C)   TempSrc: Axillary Oral Oral   SpO2: 100% 96% 96% 96%  Weight:  41.6 kg  41.1 kg   Height:         Physical Exam:  General adult female in bed in no acute distress, thin and chronically ill appearing HEENT normocephalic atraumatic extraocular movements intact sclera anicteric Neck supple trachea midline Lungs clear to auscultation bilaterally normal work of breathing at rest on 1 liter oxygen Heart S1S2 no rub Abdomen soft nontender nondistended Extremities no edema  Psych normal mood and affect Access LUE AVF with bruit and thrill   Medications reviewed   Labs:     Latest Ref Rng & Units 05/23/2023    4:43 AM 05/22/2023    4:47 AM 05/21/2023    4:50 AM  BMP  Glucose 70 - 99 mg/dL 94  91  77   BUN 6 - 20 mg/dL 24  48  29   Creatinine 0.44 - 1.00 mg/dL 1.19  1.47  8.29   Sodium 135 - 145 mmol/L 132  134  134   Potassium 3.5 - 5.1 mmol/L 4.9  4.4  3.6   Chloride 98 - 111 mmol/L 94  95  96   CO2 22 - 32 mmol/L 28  24  26    Calcium 8.9 - 10.3 mg/dL 8.6  8.9  8.4     Outpatient HD orders:  [facility]  MWF  No heparin  3 hours Noncompliant Frequently signs off or misses EDW 43 kg  BF 400 DF 500 3K/2.5 calcium  Calcitriol 1 mcg each tx Mircera 175 mcg every 2 weeks - due on 12/30 Left AVF    Assessment/Plan:   # HTN emergency  - s/p HD to optimize volume status.  Improving control  - Continue current regimen   - Would transition lasix to 80 mg dose on non-HD days (20 mg will not be effective)    # ESRD - HD per MWF schedule usually   - Had HD off schedule this week.  Plan for HD again on 12/28 (Sat) then resume MWF schedule next week.  (Her unit had a chair open for her to have HD there tomorrow but she wasn't able to get transportation).   - noncompliance with HD complicates her care - Lower EDW to post weight from last HD session - 41.1 kg.  I have called her outpatient HD unit to make that recommendation   # Legionella PNA with acute hypoxic respiratory failure - oxygen per primary team  - abx per primary team -  she is on levaquin - optimize volume status with HD as above   # Fluid volume overload  - Optimize volume status with HD  - Appears that she lost weight with hr acute illness earlier this month and I see her EDW was lowered to reflect - Noncompliance with HD complicates her care per outpatient HD unit    # Anemia of ESRD - mircera would have been due 12/30   - Will plan for ESA should she remain inpatient     # Metabolic bone disease  - continue renvela - on calcitriol - HD is TTS this week   Per primary team, HD here tomorrow then anticipate discharge.  If she remains inpatient this weekend her chart will be reviewed.  Please reach out to nephrology on call with any questions  Estanislado Emms, MD 05/23/2023 12:24 PM

## 2023-05-23 NOTE — Progress Notes (Signed)
   05/23/23 1100  ReDS Vest / Clip  Station Marker A  Ruler Value 26  ReDS Value Range (!) > 40  ReDS Actual Value 43

## 2023-05-24 DIAGNOSIS — E8770 Fluid overload, unspecified: Secondary | ICD-10-CM | POA: Diagnosis not present

## 2023-05-24 MED ORDER — PANTOPRAZOLE SODIUM 40 MG PO TBEC: 40.0000 mg | DELAYED_RELEASE_TABLET | Freq: Every day | ORAL | 0 refills | Status: DC

## 2023-05-24 MED ORDER — LEVOFLOXACIN IN D5W 500 MG/100ML IV SOLN: 500.0000 mg | INTRAVENOUS | Status: DC

## 2023-05-24 NOTE — Discharge Summary (Signed)
Physician Discharge Summary  [name] AOZ:[id number] DOB: 22-Sep-1965 DOA: 05/20/2023  PCP: Patient, No Pcp Per  Admit date: 05/20/2023  Discharge date: 05/24/2023  Admitted From:Home  Disposition:  Home  Recommendations for Outpatient Follow-up:  Follow up with PCP in 1-2 weeks Follow-up with nephrology and do further dialysis per routine schedule MWF Continue home medications as prior  Home Health: Yes with tolerated  Equipment/Devices: None  Discharge Condition:Stable  CODE STATUS: Full  Diet recommendation: Heart Healthy  Brief/Interim Summary: [name] is a 57 y.o. female with medical history significant for paraplegia, decubitus ulcer, ESRD, hypertension. Patient presented to the ED with reports of elevated blood pressure over 205/115, back pain, and headache.  She is on HD Monday Wednesday Friday, last week she missed 1 session- 12/20, but was compliant with the rest, her last HD was yesterday.    Recent hospitalization 12/6 to 12/8 for acute hypoxic respiratory failure secondary to multilobar Legionella pneumonia with acute pulmonary edema.  She was treated with Levaquin as recommended by ID.   She was admitted with acute hypoxemic respiratory failure secondary to volume overload and has undergone hemodialysis while hospitalized and also placed back on IV antibiotics to extend her course of treatment for Legionella pneumonia given findings on imaging as well as laboratory data.  Her symptoms have improved and she is no longer requiring oxygen and is now in stable condition for discharge with no other acute events or concerns noted.  Discharge Diagnoses:  Principal Problem:   Volume overload Active Problems:   Legionella pneumonia (HCC)   Decubitus ulcer of buttock   Suprapubic catheter (HCC)   ESRD (end stage renal disease) (HCC)   Seizure disorder (HCC)   Paraplegia (HCC)   Essential hypertension   Dyspnea  Principal discharge diagnosis: Acute  hypoxemic respiratory failure secondary to volume overload in the setting of ESRD/HD as well as Legionella pneumonia.  Discharge Instructions  Discharge Instructions     Diet - low sodium heart healthy   Complete by: As directed    Discharge wound care:   Complete by: As directed    Every other day    Comments: Clean R and L ischial pressure injuries with NS, apply Aquacel [place] # [phone]) to wound beds  Mon/Wed/Fri.  Cover with foam dressing.  MOISTEN AQUACEL WITH NS IF STUCK TO WOUND BED FOR ATRAUMATIC REMOVAL.  05/21/23 1559   Increase activity slowly   Complete by: As directed       Allergies as of 05/24/2023       Reactions   Benadryl [diphenhydramine Hcl (sleep)] Hives   Daptomycin Hives   Linezolid Other (See Comments)   Patient self-discontinued treatment due to GI intolerance. Taking it along with moxifloxacin   Moxifloxacin Other (See Comments)   Patient self-discontinued treatment due to GI intolerance. Taking it along with linezolid   Quinine Derivatives Other (See Comments)   Alters mental status   Vancomycin Other (See Comments)   Pt is tolerating this medication at HD   Azithromycin Itching, Rash   Tetracycline Itching   Able to tolerate Doxycycline.    Zosyn [piperacillin Sod-tazobactam So] Rash        Medication List     TAKE these medications    acetaminophen 500 MG tablet Commonly known as: TYLENOL Take 1,000 mg by mouth every 6 (six) hours as needed for moderate pain.   albuterol (2.5 MG/3ML) 0.083% nebulizer solution Commonly known as: PROVENTIL Take 3 mLs (2.5 mg total) by nebulization every  4 (four) hours as needed for wheezing or shortness of breath.   amLODipine 5 MG tablet Commonly known as: NORVASC Take 1 tablet (5 mg total) by mouth daily. Take 1 tablet on Monday,Wednesday and Friday then take 2 tablets on Tuesday,Thursday,Saturday and Sunday.   carvedilol 12.5 MG tablet Commonly known as: COREG Take 1 tablet (12.5 mg total) by  mouth 2 (two) times daily with a meal.   cloNIDine 0.1 MG tablet Commonly known as: CATAPRES Take 1 tablet (0.1 mg total) by mouth 2 (two) times daily.   Compressor/Nebulizer Misc 1 Units by Does not apply route as needed.   cyclobenzaprine 5 MG tablet Commonly known as: FLEXERIL Take 5 mg by mouth 3 (three) times daily as needed for muscle spasms.   furosemide 20 MG tablet Commonly known as: LASIX Take 20 mg by mouth daily.   irbesartan 150 MG tablet Commonly known as: AVAPRO Take 150 mg by mouth daily.   levETIRAcetam 500 MG tablet Commonly known as: KEPPRA Take 2 tablets (1,000 mg total) by mouth every evening. Take an additional tablet on Monday,Wednesday and Friday evening after Dialysis   lidocaine-prilocaine cream Commonly known as: EMLA Apply 1 Application topically 3 (three) times a week.   naloxone 4 MG/0.1ML Liqd nasal spray kit Commonly known as: NARCAN Place 0.4 mg into the nose once.   pantoprazole 40 MG tablet Commonly known as: PROTONIX Take 40 mg by mouth daily.   pantoprazole 40 MG tablet Commonly known as: PROTONIX Take 1 tablet (40 mg total) by mouth daily.   senna-docusate 8.6-50 MG tablet Commonly known as: Senokot-S Take 2 tablets by mouth at bedtime.   sevelamer carbonate 800 MG tablet Commonly known as: RENVELA Take 800 mg by mouth 3 (three) times daily.               Discharge Care Instructions  (From admission, onward)           Start     Ordered   05/24/23 0000  Discharge wound care:       Comments: Every other day    Comments: Clean R and L ischial pressure injuries with NS, apply Aquacel [place] # [phone]) to wound beds  Mon/Wed/Fri.  Cover with foam dressing.  MOISTEN AQUACEL WITH NS IF STUCK TO WOUND BED FOR ATRAUMATIC REMOVAL.  05/21/23 1559   05/24/23 1352            Allergies  Allergen Reactions   Benadryl [Diphenhydramine Hcl (Sleep)] Hives   Daptomycin Hives   Linezolid Other (See Comments)     Patient self-discontinued treatment due to GI intolerance. Taking it along with moxifloxacin   Moxifloxacin Other (See Comments)    Patient self-discontinued treatment due to GI intolerance. Taking it along with linezolid   Quinine Derivatives Other (See Comments)    Alters mental status   Vancomycin Other (See Comments)    Pt is tolerating this medication at HD   Azithromycin Itching and Rash   Tetracycline Itching    Able to tolerate Doxycycline.    Zosyn [Piperacillin Sod-Tazobactam So] Rash    Consultations: Nephrology   Procedures/Studies: CT Chest Wo Contrast Result Date: 05/20/2023 CLINICAL DATA:  Respiratory distress. EXAM: CT CHEST WITHOUT CONTRAST TECHNIQUE: Multidetector CT imaging of the chest was performed following the standard protocol without IV contrast. RADIATION DOSE REDUCTION: This exam was performed according to the departmental dose-optimization program which includes automated exposure control, adjustment of the mA and/or kV according to patient size and/or use of  iterative reconstruction technique. COMPARISON:  Chest radiograph dated 05/20/2023. chest CT dated 03/05/2023. FINDINGS: Evaluation of this exam is limited in the absence of intravenous contrast and streak artifact caused by spinal hardware. Cardiovascular: There is moderate cardiomegaly. Small pericardial effusion. There is mild atherosclerotic calcification of the thoracic aorta. No aneurysmal dilatation. The central pulmonary arteries are grossly unremarkable on this noncontrast CT. Mediastinum/Nodes: No obvious hilar or mediastinal adenopathy. Evaluation however is limited due to factors above. The esophagus is grossly unremarkable. Lungs/Pleura: There is near complete consolidation of the right lower lobe with air bronchogram and volume loss. Bilateral pleural effusions, right greater than left, and similar to prior CT. There are bibasilar atelectasis. No pneumothorax. The central airways remain patent. Upper  Abdomen: No acute abnormality. Musculoskeletal: There is diffuse subcutaneous edema and anasarca. Osteopenia with degenerative changes and posterior spinal fusion hardware. No acute osseous pathology. IMPRESSION: 1. Near complete consolidation of the right lower lobe may represent atelectasis, or pneumonia. This has progressed since the prior CT. Continued follow-up to resolution recommended. 2. Bilateral pleural effusions, right greater than left, and similar to prior CT. 3. Moderate cardiomegaly. 4.  Aortic Atherosclerosis (ICD10-I70.0). Electronically Signed   By: Elgie Collard M.D.   On: 05/20/2023 16:57   CT Head Wo Contrast Result Date: 05/20/2023 CLINICAL DATA:  Headache with worsening frequency or severity. EXAM: CT HEAD WITHOUT CONTRAST TECHNIQUE: Contiguous axial images were obtained from the base of the skull through the vertex without intravenous contrast. RADIATION DOSE REDUCTION: This exam was performed according to the departmental dose-optimization program which includes automated exposure control, adjustment of the mA and/or kV according to patient size and/or use of iterative reconstruction technique. COMPARISON:  05/14/2023 FINDINGS: Brain: No acute finding to explain worsening headache. No focal abnormality affects the brainstem or cerebellum. 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 No sign of acute infarction, mass lesion, hemorrhage, hydrocephalus or extra-axial collection. Vascular: No abnormal vascular finding. Skull: Negative Sinuses/Orbits: Clear/normal Other: None IMPRESSION: 1. No acute finding to explain worsening headache. 2. 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 Electronically Signed   By: Paulina Fusi M.D.   On: 05/20/2023 16:30   DG Chest  2 View Result Date: 05/20/2023 CLINICAL DATA:  Hypoxia. EXAM: CHEST - 2 VIEW COMPARISON:  05/14/2023 FINDINGS: Enlarged cardiac silhouette. Bilateral moderate-sized pleural effusions. Pulmonary vascular congestion. No pneumothorax. Calcified aorta. Thoracic spine fixation hardware. No acute osseous abnormalities. IMPRESSION: Findings consistent with CHF. Electronically Signed   By: Layla Maw M.D.   On: 05/20/2023 14:59   DG Chest Port 1 View Result Date: 05/14/2023 CLINICAL DATA:  Fever at home, EXAM: PORTABLE CHEST 1 VIEW COMPARISON:  05/05/2023 FINDINGS: Stable cardiomegaly. Aortic atherosclerotic calcification. Small right pleural effusion slightly increased from 05/05/2023. Probable small left pleural effusion. Associated atelectasis or pneumonia. No pneumothorax. Thoracic fusion hardware. IMPRESSION: Increased right pleural effusion. Associated atelectasis or pneumonia. Cardiomegaly. Electronically Signed   By: Minerva Fester M.D.   On: 05/14/2023 00:45   CT HEAD WO CONTRAST ( ) Result Date: 05/14/2023 CLINICAL DATA:  Headache and altered mental status EXAM: CT HEAD WITHOUT CONTRAST TECHNIQUE: Contiguous axial images were obtained from the base of the skull through the vertex without intravenous contrast. RADIATION DOSE REDUCTION: This exam was performed according to the departmental dose-optimization program which includes automated exposure control, adjustment of the mA and/or kV according  to patient size and/or use of iterative reconstruction technique. 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 No mass, hemorrhage or extra-axial collection. Mild generalized volume loss, also unchanged. Vascular: Negative Skull: Negative Sinuses/Orbits: Paranasal sinuses are clear. No mastoid effusion. Normal orbits. Other: None. IMPRESSION: 1. No acute intracranial abnormality. 2. 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 Electronically  Signed   By: Deatra Robinson M.D.   On: 05/14/2023 00:33   DG CHEST PORT 1 VIEW Result Date: 05/05/2023 CLINICAL DATA:  Shortness of breath EXAM: PORTABLE CHEST 1 VIEW COMPARISON:  05/02/2023 FINDINGS: No significant change in AP portable chest radiograph. Gross cardiomegaly with right-greater-than-left layering pleural effusions. No new airspace opacity. No acute osseous findings. IMPRESSION: No significant change in AP portable chest radiograph. Gross cardiomegaly with right-greater-than-left layering pleural effusions. No new airspace opacity. Electronically Signed   By: Jearld Lesch M.D.   On: 05/05/2023 13:57   DG Chest 2 View Result Date: 05/02/2023 CLINICAL DATA:  Weakness. EXAM: CHEST - 2 VIEW COMPARISON:  04/15/2023.  Chest CTA dated 03/05/2023 FINDINGS: Improved inspiration. Grossly stable enlarged cardiac silhouette. Dense patchy opacity in both lower lobes with additional patchy airspace opacity in the right middle lobe posterior upper lobe region on the lateral view. Increased right pleural fluid. Thoracic spine fixation hardware. IMPRESSION: 1. Bilateral multi lobar pneumonia. 2. Increased right pleural fluid. 3. Stable cardiomegaly. Electronically Signed   By: Beckie Salts M.D.   On: 05/02/2023 11:11     Discharge Exam: Vitals:   05/24/23 1300 05/24/23 1330  BP: (!) 149/73 (!) 170/90  Pulse:    Resp: 17 20  Temp:    SpO2:     Vitals:   05/24/23 1200 05/24/23 1230 05/24/23 1300 05/24/23 1330  BP: 125/68 138/73 (!) 149/73 (!) 170/90  Pulse:      Resp: 19 20 17 20   Temp:      TempSrc:      SpO2:      Weight:      Height:        General: Pt is alert, awake, not in acute distress Cardiovascular: RRR, S1/S2 +, no rubs, no gallops Respiratory: CTA bilaterally, no wheezing, no rhonchi Abdominal: Soft, NT, ND, bowel sounds + Extremities: no edema, no cyanosis    The results of significant diagnostics from this hospitalization (including imaging, microbiology, ancillary and  laboratory) are listed below for reference.     Microbiology: Recent Results (from the past 240 hours)  Blood culture (routine x 2)     Status: None (Preliminary result)   Collection Time: 05/20/23  6:21 PM   Specimen: BLOOD  Result Value Ref Range Status   Specimen Description BLOOD RIGHT ANTECUBITAL  Final   Special Requests   Final    BOTTLES DRAWN AEROBIC AND ANAEROBIC Blood Culture adequate volume   Culture   Final    NO GROWTH 4 DAYS Performed at [facility], [address], [place], [place] [address]    Report Status PENDING  Incomplete  Blood culture (routine x 2)     Status: None (Preliminary result)   Collection Time: 05/20/23  6:21 PM   Specimen: BLOOD  Result Value Ref Range Status   Specimen Description BLOOD RIGHT ANTECUBITAL  Final   Special Requests   Final    BOTTLES DRAWN AEROBIC AND ANAEROBIC Blood Culture adequate volume   Culture   Final    NO GROWTH 4 DAYS Performed at [facility], [address] [place], [place] [address]    Report Status PENDING  Incomplete     Labs: BNP (last 3 results) Recent Labs    03/05/23 1622 05/20/23 1645  BNP 2,139.0* >4,500.0*   Basic Metabolic Panel: Recent Labs  Lab 05/20/23 1645 05/21/23 0450 05/22/23 0447 05/23/23 0443  NA 137 134* 134* 132*  K 4.3 3.6 4.4 4.9  CL 101 96* 95* 94*  CO2 24 26 24 28   GLUCOSE 94 77 91 94  BUN 50* 29* 48* 24*  CREATININE 5.34* 3.77* 4.72* 2.88*  CALCIUM 8.7* 8.4* 8.9 8.6*  MG  --   --  2.0 1.9   Liver Function Tests: Recent Labs  Lab 05/20/23 1645  AST 17  ALT 13  ALKPHOS 90  BILITOT 0.9  PROT 8.1  ALBUMIN 3.2*   No results for input(s): "LIPASE", "AMYLASE" in the last 168 hours. No results for input(s): "AMMONIA" in the last 168 hours. CBC: Recent Labs  Lab 05/20/23 1645 05/21/23 0450 05/22/23 0447  WBC 7.5 6.3 5.9  NEUTROABS 5.5  --   --   HGB 12.1 10.5* 10.9*  HCT 40.6 35.3* 36.0  MCV 95.1 93.4 93.5  PLT 178 165 195   Cardiac Enzymes: No results  for input(s): "CKTOTAL", "CKMB", "CKMBINDEX", "TROPONINI" in the last 168 hours. BNP: Invalid input(s): "POCBNP" CBG: Recent Labs  Lab 05/21/23 1122  GLUCAP 104*   D-Dimer No results for input(s): "DDIMER" in the last 72 hours. Hgb A1c No results for input(s): "HGBA1C" in the last 72 hours. Lipid Profile No results for input(s): "CHOL", "HDL", "LDLCALC", "TRIG", "CHOLHDL", "LDLDIRECT" in the last 72 hours. Thyroid function studies No results for input(s): "TSH", "T4TOTAL", "T3FREE", "THYROIDAB" in the last 72 hours.  Invalid input(s): "FREET3" Anemia work up No results for input(s): "VITAMINB12", "FOLATE", "FERRITIN", "TIBC", "IRON", "RETICCTPCT" in the last 72 hours. Urinalysis    Component Value Date/Time   COLORURINE YELLOW 05/14/2023 0037   APPEARANCEUR CLOUDY (A) 05/14/2023 0037   LABSPEC 1.012 05/14/2023 0037   PHURINE 8.0 05/14/2023 0037   GLUCOSEU >=500 (A) 05/14/2023 0037   HGBUR SMALL (A) 05/14/2023 0037   BILIRUBINUR NEGATIVE 05/14/2023 0037   KETONESUR NEGATIVE 05/14/2023 0037   PROTEINUR >=300 (A) 05/14/2023 0037   UROBILINOGEN 0.2 03/31/2015 1200   NITRITE NEGATIVE 05/14/2023 0037   LEUKOCYTESUR LARGE (A) 05/14/2023 0037   Sepsis Labs Recent Labs  Lab 05/20/23 1645 05/21/23 0450 05/22/23 0447  WBC 7.5 6.3 5.9   Microbiology Recent Results (from the past 240 hours)  Blood culture (routine x 2)     Status: None (Preliminary result)   Collection Time: 05/20/23  6:21 PM   Specimen: BLOOD  Result Value Ref Range Status   Specimen Description BLOOD RIGHT ANTECUBITAL  Final   Special Requests   Final    BOTTLES DRAWN AEROBIC AND ANAEROBIC Blood Culture adequate volume   Culture   Final    NO GROWTH 4 DAYS Performed at [facility], [address], [place], [place] [address]    Report Status PENDING  Incomplete  Blood culture (routine x 2)     Status: None (Preliminary result)   Collection Time: 05/20/23  6:21 PM   Specimen: BLOOD  Result Value Ref  Range Status   Specimen Description BLOOD RIGHT ANTECUBITAL  Final   Special Requests   Final    BOTTLES DRAWN AEROBIC AND ANAEROBIC Blood Culture adequate volume   Culture   Final    NO GROWTH 4 DAYS Performed at  [facility], [address], [place], [place] [address]    Report Status PENDING  Incomplete     Time coordinating discharge: 35 minutes  SIGNED:   Erick Blinks, DO [facility] 05/24/2023, 1:57 PM  If 7PM-7AM, please contact night-coverage [email or web address]

## 2023-05-24 NOTE — TOC Transition Note (Signed)
Transition of Care [facility] - Discharge Note   Patient Details  Name: [name] MRN: [id number] Date of Birth: June 26, 1965  Transition of Care (TOC) CM/SW Contact:  Armanda Heritage, RN Phone Number: 05/24/2023, 2:06 PM   Clinical Narrative:     Patient is anticipated to discharge today, active with [facility] prior to admission and resumption of care orders for [facility] are in place.  No further TOC needs identified at this time.     Barriers to Discharge: Continued Medical Work up   Patient Goals and CMS Choice Patient states their goals for this hospitalization and ongoing recovery are:: get better CMS Medicare.gov Compare Post Acute Care list provided to:: Patient        Discharge Placement                       Discharge Plan and Services Additional resources added to the After Visit Summary for   In-house Referral: Clinical Social Work   Post Acute Care Choice: Resumption of Svcs/PTA Provider                               Social Drivers of Health (SDOH) Interventions SDOH Screenings   Food Insecurity: No Food Insecurity (05/20/2023)  Housing: Unknown (05/20/2023)  Transportation Needs: No Transportation Needs (05/20/2023)  Utilities: Not At Risk (05/20/2023)  Tobacco Use: Low Risk  (05/20/2023)     Readmission Risk Interventions    05/21/2023   12:18 PM 05/03/2023    3:06 PM 04/14/2023   11:47 AM  Readmission Risk Prevention Plan  Transportation Screening Complete Complete Complete  Medication Review Oceanographer) Complete Complete Complete  PCP or Specialist appointment within 3-5 days of discharge  Complete Not Complete  HRI or Home Care Consult Complete Complete Complete  SW Recovery Care/Counseling Consult Complete Complete Complete  Palliative Care Screening Not Applicable Not Applicable Not Applicable  Skilled Nursing Facility Not Applicable Not Applicable Not Applicable

## 2023-05-24 NOTE — Plan of Care (Signed)
  Problem: Health Behavior/Discharge Planning: Goal: Ability to manage health-related needs will improve Outcome: Not Progressing   Problem: Clinical Measurements: Goal: Ability to maintain clinical measurements within normal limits will improve Outcome: Progressing Goal: Diagnostic test results will improve Outcome: Progressing Goal: Respiratory complications will improve Outcome: Progressing   Problem: Coping: Goal: Level of anxiety will decrease Outcome: Progressing   Problem: Elimination: Goal: Will not experience complications related to urinary retention Outcome: Progressing   Problem: Pain Management: Goal: General experience of comfort will improve Outcome: Progressing   Problem: Safety: Goal: Ability to remain free from injury will improve Outcome: Progressing   Problem: Skin Integrity: Goal: Risk for impaired skin integrity will decrease Outcome: Not Progressing   Problem: Education: Goal: Ability to demonstrate management of disease process will improve Outcome: Progressing Goal: Ability to verbalize understanding of medication therapies will improve Outcome: Progressing   Problem: Activity: Goal: Capacity to carry out activities will improve Outcome: Not Progressing   Problem: Cardiac: Goal: Ability to achieve and maintain adequate cardiopulmonary perfusion will improve Outcome: Progressing   Problem: Education: Goal: Ability to demonstrate management of disease process will improve Outcome: Progressing Goal: Ability to verbalize understanding of medication therapies will improve Outcome: Progressing Goal: Individualized Educational Video(s) Outcome: Progressing

## 2023-05-24 NOTE — Progress Notes (Signed)
  HEMODIALYSIS TREATMENT NOTE:  Uneventful 3.5 hour heparin-free treatment completed using left upper arm AVF (15g/antegrade). Goal met: 3 liters removed without interruption in UF.  Hemodynamically stable throughout session.  All blood was returned.  Hemostasis was achieved in 5 minutes.  Post-HD:  05/24/23 1427  Vital Signs  Temp 98 F (36.7 C)  Temp Source Oral  Pulse Rate 70  Pulse Rate Source Monitor  Resp 20  BP (!) 145/72  BP Location Right Arm  BP Method Automatic  Patient Position (if appropriate) Lying  Pain Assessment  Pain Score 0  Dialysis Weight  Weight 41 kg  Type of Weight Post-Dialysis  Post Treatment  Dialyzer Clearance Lightly streaked  Hemodialysis Intake (mL) 0 mL  Liters Processed 69.9  Fluid Removed (mL) 3000 mL  Tolerated HD Treatment Yes  Post-Hemodialysis Comments Goal met  AVG/AVF Arterial Site Held (minutes) 5 minutes  AVG/AVF Venous Site Held (minutes) 5 minutes  Fistula / Graft Left Upper arm Arteriovenous fistula  No placement date or time found.   Placed prior to admission: Yes  Orientation: Left  Access Location: Upper arm  Access Type: Arteriovenous fistula  Fistula / Graft Assessment Thrill;Bruit  Status Patent  Education / Care Plan  Dialysis Education Provided Yes  Documented Education in Care Plan Yes  Outpatient Plan of Care Reviewed and on Chart Yes     Arman Filter, RN AP [facility]

## 2023-05-25 LAB — CULTURE, BLOOD (ROUTINE X 2)
Culture: NO GROWTH
Culture: NO GROWTH
Special Requests: ADEQUATE
Special Requests: ADEQUATE

## 2023-06-10 ENCOUNTER — Emergency Department (HOSPITAL_COMMUNITY): Payer: 59

## 2023-06-10 ENCOUNTER — Encounter (HOSPITAL_COMMUNITY): Payer: Self-pay

## 2023-06-10 ENCOUNTER — Other Ambulatory Visit: Payer: Self-pay

## 2023-06-10 ENCOUNTER — Observation Stay (HOSPITAL_COMMUNITY)
Admission: EM | Admit: 2023-06-10 | Discharge: 2023-06-11 | Disposition: A | Discharge status: Home / Self Care | Payer: 59 | Attending: Internal Medicine | Admitting: Internal Medicine

## 2023-06-10 DIAGNOSIS — Z8542 Personal history of malignant neoplasm of other parts of uterus: Secondary | ICD-10-CM | POA: Diagnosis not present

## 2023-06-10 DIAGNOSIS — I120 Hypertensive chronic kidney disease with stage 5 chronic kidney disease or end stage renal disease: Secondary | ICD-10-CM | POA: Insufficient documentation

## 2023-06-10 DIAGNOSIS — E875 Hyperkalemia: Principal | ICD-10-CM

## 2023-06-10 DIAGNOSIS — I10 Essential (primary) hypertension: Secondary | ICD-10-CM | POA: Diagnosis present

## 2023-06-10 DIAGNOSIS — L89159 Pressure ulcer of sacral region, unspecified stage: Secondary | ICD-10-CM | POA: Insufficient documentation

## 2023-06-10 DIAGNOSIS — D631 Anemia in chronic kidney disease: Secondary | ICD-10-CM | POA: Diagnosis not present

## 2023-06-10 DIAGNOSIS — Z7951 Long term (current) use of inhaled steroids: Secondary | ICD-10-CM | POA: Insufficient documentation

## 2023-06-10 DIAGNOSIS — Z992 Dependence on renal dialysis: Secondary | ICD-10-CM | POA: Diagnosis not present

## 2023-06-10 DIAGNOSIS — K219 Gastro-esophageal reflux disease without esophagitis: Secondary | ICD-10-CM | POA: Diagnosis not present

## 2023-06-10 DIAGNOSIS — R0602 Shortness of breath: Secondary | ICD-10-CM | POA: Diagnosis present

## 2023-06-10 DIAGNOSIS — Z79899 Other long term (current) drug therapy: Secondary | ICD-10-CM | POA: Insufficient documentation

## 2023-06-10 DIAGNOSIS — E8770 Fluid overload, unspecified: Secondary | ICD-10-CM | POA: Diagnosis not present

## 2023-06-10 DIAGNOSIS — G8220 Paraplegia, unspecified: Secondary | ICD-10-CM | POA: Diagnosis not present

## 2023-06-10 DIAGNOSIS — G40909 Epilepsy, unspecified, not intractable, without status epilepticus: Secondary | ICD-10-CM

## 2023-06-10 DIAGNOSIS — N186 End stage renal disease: Secondary | ICD-10-CM | POA: Diagnosis not present

## 2023-06-10 LAB — CBG MONITORING, ED: Glucose-Capillary: 127 mg/dL — ABNORMAL HIGH (ref 70–99)

## 2023-06-10 LAB — CBC WITH DIFFERENTIAL/PLATELET
Abs Immature Granulocytes: 0.02 10*3/uL (ref 0.00–0.07)
Basophils Absolute: 0.1 10*3/uL (ref 0.0–0.1)
Basophils Relative: 1 %
Eosinophils Absolute: 0.3 10*3/uL (ref 0.0–0.5)
Eosinophils Relative: 4 %
HCT: 42.7 % (ref 36.0–46.0)
Hemoglobin: 13.2 g/dL (ref 12.0–15.0)
Immature Granulocytes: 0 %
Lymphocytes Relative: 14 %
Lymphs Abs: 1.2 10*3/uL (ref 0.7–4.0)
MCH: 27.5 pg (ref 26.0–34.0)
MCHC: 30.9 g/dL (ref 30.0–36.0)
MCV: 89 fL (ref 80.0–100.0)
Monocytes Absolute: 0.5 10*3/uL (ref 0.1–1.0)
Monocytes Relative: 6 %
Neutro Abs: 6.4 10*3/uL (ref 1.7–7.7)
Neutrophils Relative %: 75 %
Platelets: 188 10*3/uL (ref 150–400)
RBC: 4.8 MIL/uL (ref 3.87–5.11)
RDW: 17.5 % — ABNORMAL HIGH (ref 11.5–15.5)
WBC: 8.4 10*3/uL (ref 4.0–10.5)
nRBC: 0 % (ref 0.0–0.2)

## 2023-06-10 LAB — BASIC METABOLIC PANEL
Anion gap: 13 (ref 5–15)
BUN: 96 mg/dL — ABNORMAL HIGH (ref 6–20)
CO2: 21 mmol/L — ABNORMAL LOW (ref 22–32)
Calcium: 8.8 mg/dL — ABNORMAL LOW (ref 8.9–10.3)
Chloride: 99 mmol/L (ref 98–111)
Creatinine, Ser: 7.14 mg/dL — ABNORMAL HIGH (ref 0.44–1.00)
GFR, Estimated: 6 mL/min — ABNORMAL LOW (ref >60)
Glucose, Bld: 97 mg/dL (ref 70–99)
Potassium: 7.2 mmol/L (ref 3.5–5.1)
Sodium: 133 mmol/L — ABNORMAL LOW (ref 135–145)

## 2023-06-10 LAB — COMPREHENSIVE METABOLIC PANEL
ALT: 10 U/L (ref 0–44)
AST: 16 U/L (ref 15–41)
Albumin: 3.3 g/dL — ABNORMAL LOW (ref 3.5–5.0)
Alkaline Phosphatase: 109 U/L (ref 38–126)
Anion gap: 16 — ABNORMAL HIGH (ref 5–15)
BUN: 97 mg/dL — ABNORMAL HIGH (ref 6–20)
CO2: 20 mmol/L — ABNORMAL LOW (ref 22–32)
Calcium: 9.1 mg/dL (ref 8.9–10.3)
Chloride: 99 mmol/L (ref 98–111)
Creatinine, Ser: 6.97 mg/dL — ABNORMAL HIGH (ref 0.44–1.00)
GFR, Estimated: 6 mL/min — ABNORMAL LOW (ref >60)
Glucose, Bld: 99 mg/dL (ref 70–99)
Potassium: 7 mmol/L (ref 3.5–5.1)
Sodium: 135 mmol/L (ref 135–145)
Total Bilirubin: 0.9 mg/dL (ref 0.0–1.2)
Total Protein: 8.3 g/dL — ABNORMAL HIGH (ref 6.5–8.1)

## 2023-06-10 LAB — GLUCOSE, CAPILLARY: Glucose-Capillary: 139 mg/dL — ABNORMAL HIGH (ref 70–99)

## 2023-06-10 LAB — TROPONIN I (HIGH SENSITIVITY)
Troponin I (High Sensitivity): 25 ng/L — ABNORMAL HIGH (ref <18)
Troponin I (High Sensitivity): 22 ng/L — ABNORMAL HIGH (ref <18)

## 2023-06-10 LAB — HEPATITIS B SURFACE ANTIGEN: Hepatitis B Surface Ag: NONREACTIVE

## 2023-06-10 LAB — BRAIN NATRIURETIC PEPTIDE: B Natriuretic Peptide: >4500 pg/mL — ABNORMAL HIGH (ref 0.0–100.0)

## 2023-06-10 MED ORDER — ALBUTEROL SULFATE HFA 108 (90 BASE) MCG/ACT IN AERS
2.0000 | INHALATION_SPRAY | RESPIRATORY_TRACT | Status: DC | PRN
  Administered 2023-06-10: 2 via RESPIRATORY_TRACT
  Filled 2023-06-10: qty 6.7

## 2023-06-10 MED ORDER — SODIUM CHLORIDE 0.9% FLUSH
3.0000 mL | Freq: Two times a day (BID) | INTRAVENOUS | Status: DC
  Administered 2023-06-10 – 2023-06-11 (×2): 3 mL via INTRAVENOUS

## 2023-06-10 MED ORDER — CHLORHEXIDINE GLUCONATE CLOTH 2 % EX PADS
6.0000 | MEDICATED_PAD | Freq: Every day | CUTANEOUS | Status: DC
  Administered 2023-06-11: 6 via TOPICAL

## 2023-06-10 MED ORDER — LIDOCAINE HCL (PF) 1 % IJ SOLN: 5.0000 mL | INTRAMUSCULAR | Status: DC | PRN

## 2023-06-10 MED ORDER — CLONIDINE HCL 0.1 MG PO TABS
0.1000 mg | ORAL_TABLET | Freq: Once | ORAL | Status: AC
  Administered 2023-06-10: 0.1 mg via ORAL
  Filled 2023-06-10: qty 1

## 2023-06-10 MED ORDER — HEPARIN SODIUM (PORCINE) 5000 UNIT/ML IJ SOLN
5000.0000 [IU] | Freq: Three times a day (TID) | INTRAMUSCULAR | Status: DC
  Administered 2023-06-10 – 2023-06-11 (×2): 5000 [IU] via SUBCUTANEOUS
  Filled 2023-06-10 (×2): qty 1

## 2023-06-10 MED ORDER — TRAZODONE HCL 50 MG PO TABS: 50.0000 mg | ORAL_TABLET | Freq: Every evening | ORAL | Status: DC | PRN

## 2023-06-10 MED ORDER — SODIUM CHLORIDE 0.9 % IV SOLN
INTRAVENOUS | Status: DC | PRN
Start: 2023-06-10 — End: 2023-06-11

## 2023-06-10 MED ORDER — LIDOCAINE-PRILOCAINE 2.5-2.5 % EX CREA: 1.0000 | TOPICAL_CREAM | CUTANEOUS | Status: DC | PRN

## 2023-06-10 MED ORDER — AMLODIPINE BESYLATE 5 MG PO TABS
5.0000 mg | ORAL_TABLET | Freq: Every day | ORAL | Status: DC
  Administered 2023-06-11: 5 mg via ORAL
  Filled 2023-06-10: qty 1

## 2023-06-10 MED ORDER — LIDOCAINE-PRILOCAINE 2.5-2.5 % EX CREA
1.0000 | TOPICAL_CREAM | CUTANEOUS | Status: DC
  Filled 2023-06-10: qty 5

## 2023-06-10 MED ORDER — ACETAMINOPHEN 650 MG RE SUPP: 650.0000 mg | Freq: Four times a day (QID) | RECTAL | Status: DC | PRN

## 2023-06-10 MED ORDER — SEVELAMER CARBONATE 800 MG PO TABS
800.0000 mg | ORAL_TABLET | Freq: Three times a day (TID) | ORAL | Status: DC
  Administered 2023-06-11: 800 mg via ORAL
  Filled 2023-06-10: qty 1

## 2023-06-10 MED ORDER — LEVETIRACETAM 500 MG PO TABS
1000.0000 mg | ORAL_TABLET | Freq: Every evening | ORAL | Status: DC
  Administered 2023-06-10: 1000 mg via ORAL
  Filled 2023-06-10: qty 2

## 2023-06-10 MED ORDER — DEXTROSE 50 % IV SOLN
1.0000 | Freq: Once | INTRAVENOUS | Status: AC
  Administered 2023-06-10: 50 mL via INTRAVENOUS
  Filled 2023-06-10: qty 50

## 2023-06-10 MED ORDER — AMLODIPINE BESYLATE 5 MG PO TABS
10.0000 mg | ORAL_TABLET | Freq: Once | ORAL | Status: AC
  Administered 2023-06-10: 10 mg via ORAL
  Filled 2023-06-10: qty 2

## 2023-06-10 MED ORDER — CALCIUM GLUCONATE 10 % IV SOLN
1.0000 g | Freq: Once | INTRAVENOUS | Status: AC
  Administered 2023-06-10: 1 g via INTRAVENOUS
  Filled 2023-06-10: qty 10

## 2023-06-10 MED ORDER — CYCLOBENZAPRINE HCL 10 MG PO TABS: 5.0000 mg | ORAL_TABLET | Freq: Three times a day (TID) | ORAL | Status: DC | PRN

## 2023-06-10 MED ORDER — ONDANSETRON HCL 4 MG/2ML IJ SOLN
4.0000 mg | Freq: Four times a day (QID) | INTRAMUSCULAR | Status: DC | PRN
Start: 2023-06-10 — End: 2023-06-11

## 2023-06-10 MED ORDER — PANTOPRAZOLE SODIUM 40 MG PO TBEC
40.0000 mg | DELAYED_RELEASE_TABLET | Freq: Every day | ORAL | Status: DC
  Administered 2023-06-10 – 2023-06-11 (×2): 40 mg via ORAL
  Filled 2023-06-10 (×2): qty 1

## 2023-06-10 MED ORDER — CARVEDILOL 12.5 MG PO TABS
12.5000 mg | ORAL_TABLET | Freq: Two times a day (BID) | ORAL | Status: DC
  Administered 2023-06-11: 12.5 mg via ORAL
  Filled 2023-06-10: qty 1

## 2023-06-10 MED ORDER — SODIUM CHLORIDE 0.9% FLUSH: 3.0000 mL | INTRAVENOUS | Status: DC | PRN

## 2023-06-10 MED ORDER — IRBESARTAN 150 MG PO TABS: 150.0000 mg | ORAL_TABLET | Freq: Every day | ORAL | Status: DC

## 2023-06-10 MED ORDER — PENTAFLUOROPROP-TETRAFLUOROETH EX AERO: 1.0000 | INHALATION_SPRAY | CUTANEOUS | Status: DC | PRN

## 2023-06-10 MED ORDER — FUROSEMIDE 10 MG/ML IJ SOLN
40.0000 mg | Freq: Once | INTRAMUSCULAR | Status: AC
  Administered 2023-06-10: 40 mg via INTRAVENOUS
  Filled 2023-06-10: qty 4

## 2023-06-10 MED ORDER — ALBUTEROL SULFATE (2.5 MG/3ML) 0.083% IN NEBU
10.0000 mg | INHALATION_SOLUTION | Freq: Once | RESPIRATORY_TRACT | Status: AC
  Administered 2023-06-10: 10 mg via RESPIRATORY_TRACT
  Filled 2023-06-10: qty 12

## 2023-06-10 MED ORDER — FUROSEMIDE 40 MG PO TABS
20.0000 mg | ORAL_TABLET | Freq: Every day | ORAL | Status: DC
  Administered 2023-06-10 – 2023-06-11 (×2): 20 mg via ORAL
  Filled 2023-06-10 (×2): qty 1

## 2023-06-10 MED ORDER — POLYETHYLENE GLYCOL 3350 17 G PO PACK: 17.0000 g | PACK | Freq: Every day | ORAL | Status: DC | PRN

## 2023-06-10 MED ORDER — SENNOSIDES-DOCUSATE SODIUM 8.6-50 MG PO TABS
2.0000 | ORAL_TABLET | Freq: Every day | ORAL | Status: DC
  Administered 2023-06-10: 2 via ORAL
  Filled 2023-06-10: qty 2

## 2023-06-10 MED ORDER — BISACODYL 10 MG RE SUPP: 10.0000 mg | Freq: Every day | RECTAL | Status: DC | PRN

## 2023-06-10 MED ORDER — INSULIN ASPART 100 UNIT/ML IV SOLN
5.0000 [IU] | Freq: Once | INTRAVENOUS | Status: AC
  Administered 2023-06-10: 5 [IU] via INTRAVENOUS

## 2023-06-10 MED ORDER — SODIUM ZIRCONIUM CYCLOSILICATE 5 G PO PACK
10.0000 g | PACK | Freq: Once | ORAL | Status: AC
  Administered 2023-06-10: 10 g via ORAL
  Filled 2023-06-10: qty 2

## 2023-06-10 MED ORDER — ACETAMINOPHEN 325 MG PO TABS
650.0000 mg | ORAL_TABLET | Freq: Four times a day (QID) | ORAL | Status: DC | PRN
  Administered 2023-06-10: 650 mg via ORAL
  Filled 2023-06-10: qty 2

## 2023-06-10 MED ORDER — CLONIDINE HCL 0.1 MG PO TABS
0.1000 mg | ORAL_TABLET | Freq: Two times a day (BID) | ORAL | Status: DC
  Administered 2023-06-10 – 2023-06-11 (×2): 0.1 mg via ORAL
  Filled 2023-06-10 (×2): qty 1

## 2023-06-10 MED ORDER — ONDANSETRON HCL 4 MG PO TABS: 4.0000 mg | ORAL_TABLET | Freq: Four times a day (QID) | ORAL | Status: DC | PRN

## 2023-06-10 NOTE — Progress Notes (Signed)
 Called by ED MD regarding pt who missed 2 HD sessions and presented with SOB.  K 7.2, BNP >4500.  CXR with CHF and small bilateral pleural effusions.  Will plan for urgent HD and send back to the ED for possible discharge to home.  She is due to dialysis at her unit tomorrow.

## 2023-06-10 NOTE — ED Provider Notes (Signed)
 [place] EMERGENCY DEPARTMENT AT [facility] Provider Note   CSN: [id number] Arrival date & time: 06/10/23  0844     History  Chief Complaint  Patient presents with   Shortness of Breath    [name] is a 58 y.o. female.   Shortness of Breath Associated symptoms: headaches   Associated symptoms: no abdominal pain, no chest pain, no fever and no vomiting        [name] is a 58 y.o. female with past medical history of end-stage renal disease, hemodialysis Monday Wednesday Friday, history of paraplegia, seizures, has suprapubic catheter, hypertension, TBI who presents to the Emergency Department complaining of shortness of breath, headache and elevated blood pressure.  States that she missed her dialysis appointment yesterday due to poor road conditions.  She had some shortness of breath yesterday that improved after a neb treatment.  Today, she woke with frontal headache and persistent shortness of breath.  She was planning to go to dialysis today when she decided to come to the emergency department for further evaluation.  She denies any abdominal pain nausea vomiting, chest pain fever or chills.  Has not taken her morning medication, including her antihypertensive medication  Home Medications Prior to Admission medications   Medication Sig Start Date End Date Taking? Authorizing Provider  Nebulizers (COMPRESSOR/NEBULIZER) MISC 1 Units by Does not apply route as needed. 04/15/23  Yes Emokpae, Courage, MD  acetaminophen  (TYLENOL ) 500 MG tablet Take 1,000 mg by mouth every 6 (six) hours as needed for moderate pain.    [provider]  albuterol  (PROVENTIL ) (2.5 MG/3ML) 0.083% nebulizer solution Take 3 mLs (2.5 mg total) by nebulization every 4 (four) hours as needed for wheezing or shortness of breath. 04/15/23 04/14/24  Pearlean Manus, MD  amLODipine  (NORVASC ) 5 MG tablet Take 1 tablet (5 mg total) by mouth daily. Take 1 tablet on  Monday,Wednesday and Friday then take 2 tablets on Tuesday,Thursday,Saturday and Sunday. 04/15/23   Pearlean Manus, MD  carvedilol  (COREG ) 12.5 MG tablet Take 1 tablet (12.5 mg total) by mouth 2 (two) times daily with a meal. 04/15/23 08/03/23  Emokpae, Courage, MD  cloNIDine  (CATAPRES ) 0.1 MG tablet Take 1 tablet (0.1 mg total) by mouth 2 (two) times daily. 04/15/23   Pearlean Manus, MD  cyclobenzaprine  (FLEXERIL ) 5 MG tablet Take 5 mg by mouth 3 (three) times daily as needed for muscle spasms. 04/11/23   [provider]  furosemide  (LASIX ) 20 MG tablet Take 20 mg by mouth daily. 05/13/23   [provider]  irbesartan  (AVAPRO ) 150 MG tablet Take 150 mg by mouth daily. 05/13/23   [provider]  levETIRAcetam  (KEPPRA ) 500 MG tablet Take 2 tablets (1,000 mg total) by mouth every evening. Take an additional tablet on Monday,Wednesday and Friday evening after Dialysis 04/15/23   Pearlean Manus, MD  lidocaine -prilocaine  (EMLA ) cream Apply 1 Application topically 3 (three) times a week. 06/04/22   [provider]  naloxone  (NARCAN ) nasal spray 4 mg/0.1 mL Place 0.4 mg into the nose once. 10/03/22   [provider]  pantoprazole  (PROTONIX ) 40 MG tablet Take 40 mg by mouth daily.    [provider]  pantoprazole  (PROTONIX ) 40 MG tablet Take 1 tablet (40 mg total) by mouth daily. 05/24/23   Maree, Pratik D, DO  senna-docusate (SENOKOT-S) 8.6-50 MG tablet Take 2 tablets by mouth at bedtime. 11/12/22   Pearlean Manus, MD  sevelamer  carbonate (RENVELA ) 800 MG tablet Take 800 mg by mouth 3 (three)  times daily. 12/11/22   [provider]      Allergies    Benadryl [diphenhydramine hcl (sleep)], Daptomycin, Linezolid, Moxifloxacin, Quinine derivatives, Vancomycin , Azithromycin , Tetracycline, and Zosyn  [piperacillin  sod-tazobactam so]    Review of Systems   Review of Systems  Constitutional:  Negative for chills and fever.  Respiratory:  Positive  for shortness of breath.   Cardiovascular:  Negative for chest pain and leg swelling.  Gastrointestinal:  Negative for abdominal pain, nausea and vomiting.  Genitourinary:  Negative for dysuria.  Musculoskeletal:  Negative for back pain.  Neurological:  Positive for headaches. Negative for syncope, weakness and numbness.  Psychiatric/Behavioral:  Negative for confusion.     Physical Exam Updated Vital Signs BP (!) 197/102 (BP Location: Right Arm)   Pulse 97   Temp 97.7 F (36.5 C) (Oral)   Resp 20   Ht 5' 2 (1.575 m)   Wt 41 kg   LMP  (LMP Unknown)   SpO2 93%   BMI 16.53 kg/m  Physical Exam Vitals and nursing note reviewed.  Constitutional:      General: She is not in acute distress.    Appearance: She is not toxic-appearing.  Cardiovascular:     Rate and Rhythm: Normal rate and regular rhythm.     Pulses: Normal pulses.  Pulmonary:     Breath sounds: No stridor. No wheezing, rhonchi or rales.     Comments: Lungs clear to auscultation on my exam.  Patient does have some increased work of breathing Abdominal:     Palpations: Abdomen is soft.     Tenderness: There is no abdominal tenderness.  Musculoskeletal:     Right lower leg: No edema.     Left lower leg: No edema.  Skin:    General: Skin is warm.     Capillary Refill: Capillary refill takes less than 2 seconds.  Neurological:     General: No focal deficit present.     Mental Status: She is alert.     Sensory: No sensory deficit.     Motor: No weakness.     ED Results / Procedures / Treatments   Labs (all labs ordered are listed, but only abnormal results are displayed) Labs Reviewed  CBC WITH DIFFERENTIAL/PLATELET - Abnormal; Notable for the following components:      Result Value   RDW 17.5 (*)    All other components within normal limits  COMPREHENSIVE METABOLIC PANEL - Abnormal; Notable for the following components:   Potassium 7.0 (*)    CO2 20 (*)    BUN 97 (*)    Creatinine, Ser 6.97 (*)     Total Protein 8.3 (*)    Albumin  3.3 (*)    GFR, Estimated 6 (*)    Anion gap 16 (*)    All other components within normal limits  BRAIN NATRIURETIC PEPTIDE - Abnormal; Notable for the following components:   B Natriuretic Peptide >4,500.0 (*)    All other components within normal limits  BASIC METABOLIC PANEL - Abnormal; Notable for the following components:   Sodium 133 (*)    Potassium 7.2 (*)    CO2 21 (*)    BUN 96 (*)    Creatinine, Ser 7.14 (*)    Calcium  8.8 (*)    GFR, Estimated 6 (*)    All other components within normal limits  TROPONIN I (HIGH SENSITIVITY) - Abnormal; Notable for the following components:   Troponin I (High Sensitivity) 25 (*)    All  other components within normal limits  TROPONIN I (HIGH SENSITIVITY) - Abnormal; Notable for the following components:   Troponin I (High Sensitivity) 22 (*)    All other components within normal limits    EKG EKG Interpretation Date/Time:  Tuesday June 10 2023 11:55:02 EST Ventricular Rate:  91 PR Interval:  157 QRS Duration:  130 QT Interval:  400 QTC Calculation: 493 R Axis:   -81  Text Interpretation: Sinus rhythm Right bundle branch block Probable anterior infarct, old more peaked t waves compared to prior Confirmed by Dean Clarity [id number]) on 06/10/2023 11:58:42 AM  Radiology CT Head Wo Contrast Result Date: 06/10/2023 CLINICAL DATA:  Headache, new onset (Age >= 51y) EXAM: CT HEAD WITHOUT CONTRAST TECHNIQUE: Contiguous axial images were obtained from the base of the skull through the vertex without intravenous contrast. RADIATION DOSE REDUCTION: This exam was performed according to the departmental dose-optimization program which includes automated exposure control, adjustment of the mA and/or kV according to patient size and/or use of iterative reconstruction technique. COMPARISON:  CT head 05/20/2023. FINDINGS: Brain: Remote left frontal infarct, similar. Similar cerebral atrophy. No evidence of acute large  vascular territory infarct, acute hemorrhage, mass lesion, midline shift or hydrocephalus. Vascular: Calcific atherosclerosis. No hyperdense vessel identified. Skull: No acute fracture. Sinuses/Orbits: Clear sinuses.  No acute orbital findings. Other: No mastoid effusions. IMPRESSION: 1. Stable head CT.  No evidence of acute intracranial abnormality. 2. 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 Electronically Signed   By: Gilmore GORMAN Molt M.D.   On: 06/10/2023 10:11   DG Chest 2 View Result Date: 06/10/2023 CLINICAL DATA:  Shortness of breath EXAM: CHEST - 2 VIEW COMPARISON:  Chest radiograph dated 05/20/2023. FINDINGS: There is cardiomegaly with vascular congestion and mild edema. Small bilateral pleural effusions and associated atelectasis or infiltrate. No pneumothorax. No acute osseous pathology. Degenerative changes of the spine and mild scoliosis. Spinal Harrington rod. IMPRESSION: Findings of CHF and small bilateral pleural effusions. Pneumonia is not excluded. Electronically Signed   By: Vanetta Chou M.D.   On: 06/10/2023 09:49    Procedures Procedures     CRITICAL CARE Performed by: Kaelee Pfeffer Total critical care time: 45 minutes Critical care time was exclusive of separately billable procedures and treating other patients. Critical care was necessary to treat or prevent imminent or life-threatening deterioration. Critical care was time spent personally by me on the following activities: development of treatment plan with patient and/or surrogate as well as nursing, discussions with consultants, evaluation of patient's response to treatment, examination of patient, obtaining history from patient or surrogate, ordering and performing treatments and interventions, ordering and review of laboratory studies, ordering and review of radiographic studies, pulse oximetry and re-evaluation of patient's condition.  Patient required multiple interventions  including managing, labs, EKG, multiple medications for management of her hyperkalemia.  Consultations to specialty provider and to hospitalist   Medications Ordered in ED Medications  albuterol  (VENTOLIN  HFA) 108 (90 Base) MCG/ACT inhaler 2 puff (2 puffs Inhalation Given 06/10/23 1048)  furosemide  (LASIX ) injection 40 mg (has no administration in time range)  sodium zirconium cyclosilicate  (LOKELMA ) packet 10 g (has no administration in time range)  albuterol  (PROVENTIL ) (2.5 MG/3ML) 0.083% nebulizer solution 10 mg (has no administration in time range)  calcium  gluconate inj 10% (1 g) URGENT USE ONLY! (has no administration in time range)  insulin  aspart (novoLOG ) injection 5 Units (has no administration in time range)    And  dextrose  50 % solution 50 mL (  has no administration in time range)  amLODipine  (NORVASC ) tablet 10 mg (10 mg Oral Given 06/10/23 1048)  cloNIDine  (CATAPRES ) tablet 0.1 mg (0.1 mg Oral Given 06/10/23 1048)    ED Course/ Medical Decision Making/ A&P                                 Medical Decision Making Patient here with history of end-stage renal disease dialyzed Monday Wednesday Friday also has history of seizures and has suprapubic catheter as she still makes some urine.  Here in the ER today for headache and shortness of breath.  Missed her dialysis yesterday  On exam, patient does have some labored breathing, no rales or wheezing on my exam.  O2 sats above 90% on room air.  No peripheral edema.  I suspect this is volume overload secondary to missed dialysis.  Electrolyte abnormality also considered.  Pneumonia and ACS also considered.  Patient ill appearing, complaints carry high morbidity/mortality risk    Amount and/or Complexity of Data Reviewed Labs: ordered.    Details: Labs interpreted by me, no evidence of leukocytosis, hemoglobin unremarkable, chemistries show potassium of 7 BUN 97 and serum creatinine 6.97.  No significant hyperkalemia, potassium  was rechecked, repeat was 7.2  BNP greater than 4500 Radiology: ordered.    Details: Chest x-ray shows findings suggestive of CHF with small bilateral pleural effusions  CT head without acute intracranial abnormality. ECG/medicine tests: ordered.    Details: EKG shows sinus rhythm right bundle branch block, today's EKG shows more peaked T waves compared to previous Discussion of management or test interpretation with external provider(s):   Patient here missed dialysis yesterday, significant hyperkalemia with potassium greater than 7 today.  Discussed findings with nephrology, Dr. Rayburn who will arrange for patient to be emergently dialyzed.  Also discussed with [facility] hospitalist Dr. JAYSON Carwin who agrees to admit   Risk OTC drugs. Prescription drug management. Decision regarding hospitalization.           Final Clinical Impression(s) / ED Diagnoses Final diagnoses:  Hyperkalemia    Rx / DC Orders ED Discharge Orders     None         Herlinda Milling, PA-C 06/10/23 1943    Dean Clarity, MD 06/11/23 1702

## 2023-06-10 NOTE — H&P (Signed)
 Patient Demographics:    [name], is a 58 y.o. female  MRN: [id number]   DOB - 08-02-1965  Admit Date - 06/10/2023  Outpatient Primary MD for the patient is Patient, No Pcp Per   Assessment & Plan:   Assessment and Plan:  Apparently could not make it to hemodialysis on 06/09/2023  -Patient endorses dyspnea, frontal headache no productive cough no fevers no chest pains -No vomiting or diarrhea -CT head without acute findings    1)Hyperkalemia in an ESRD Patient--- potassium is 7.2 -EKG sinus rhythm with right bundle branch block -Hyperkalemia cocktail given fluid and dextrose  with insulin , Lokelma , calcium  chloride -Nephrology consult for hemodialysis requested  2)Volume overload/Pulmonary edema--in an ESRD patient MWF -Patient presented with significant dyspnea  -chest x-ray with CHF and bilateral pleural effusions -Troponin  25 >>  22, EKG sinus rhythm with right bundle branch block -BNP greater than 4500 -Nephrology consult appreciated =-Patient undergoing hemodialysis on 06/10/2023  3)ESRD---MWF-- -patient missed hemodialysis on 06/09/2023 due to icy roads --Nephrology consult appreciated =-Patient undergoing hemodialysis on 06/10/2023   4)HTN---BP in the ED was 197/102---patient presented with dyspnea and headaches -CTA without acute findings -BP improving with hemodialysis -Resume PTA BP meds--including amlodipine , Coreg  and  clonidine   5)Bilateral sacral and buttock decubitus ulcers, both of them appear clean, with no evidence of surrounding cellulitis or wound infection. --- Wound care consult appreciated    6)Chronic Anemia of ESRD Hemoglobin actually currently up to 13 -Defer decision on Procrit/ESA agent to nephrology team   7)Paraplegia /ambulatory dysfunction-neurogenic  bladder - chronic indwelling catheter. -- Supportive care   8) seizure disorder--continue Keppra  1000 mg nightly   9)GERD-continue Protonix  40 mg daily   Status is: Inpatient  Dispo: The patient is from: Home              Anticipated d/c is to: Home--- anticipate discharge in 06/11/2023 after second dialysis session              Anticipated d/c date is: 1 day              Patient currently is not medically stable to d/c. Barriers: Not Clinically Stable-   With History of - Reviewed by me  Past Medical History:  Diagnosis Date   Abnormal uterine bleeding (AUB) 06/15/2014   Cancer (HCC)    uterine   High blood pressure    Paraplegia (lower)    Renal disorder    Seizure disorder (HCC)    Seizures (HCC)    Suprapubic catheter (HCC)    Urinary tract infection       Past Surgical History:  Procedure Laterality Date   APPLICATION OF WOUND VAC Right 09/13/2021   (approximately 1-19mos ago) pressure sore on right hip   BACK SURGERY     Pt stated before 2000   BIOPSY  12/03/2022   Procedure: BIOPSY;  Surgeon: [name], MD;  Location: [facility];  Service:  Gastroenterology;;   ESOPHAGOGASTRODUODENOSCOPY N/A 09/20/2015   Procedure: ESOPHAGOGASTRODUODENOSCOPY (EGD);  Surgeon: [name], MD;  Location: [facility];  Service: Endoscopy;  Laterality: N/A;  730   ESOPHAGOGASTRODUODENOSCOPY (EGD) WITH PROPOFOL  N/A 12/03/2022   Procedure: ESOPHAGOGASTRODUODENOSCOPY (EGD) WITH PROPOFOL ;  Surgeon: [name], MD;  Location: [facility];  Service: Gastroenterology;  Laterality: N/A;  1:15 pm, asa 3, pt knows to arrive at 10:30  dialysis pt, M,W & F   IR CATHETER TUBE CHANGE  04/02/2018   PERCUTANEOUS ENDOSCOPIC GASTROSTOMY (PEG) REMOVAL N/A 09/20/2015   Procedure: PERCUTANEOUS ENDOSCOPIC GASTROSTOMY (PEG) REMOVAL;  Surgeon: [name], MD;  Location: [facility];  Service: Endoscopy;  Laterality: N/A;   TEE WITHOUT CARDIOVERSION N/A  11/11/2022   Procedure: TRANSESOPHAGEAL ECHOCARDIOGRAM (TEE);  Surgeon: [name], MD;  Location: [facility];  Service: Cardiovascular;  Laterality: N/A;    Chief Complaint  Patient presents with   Shortness of Breath      HPI:    [name]  is a 58 y.o. female  with medical history significant for  HTN,  ESRD, HTN, paraplegia with neuromuscular deficits, suprapubic cath, ESRD, decubitus ulcers to the ED with shortness of breath after missing hemodialysis sessions -Apparently could not make it to hemodialysis on 06/09/2023 -BP in the ED was 197/102 -Patient endorses dyspnea, frontal headache no productive cough no fevers no chest pains -No vomiting or diarrhea -CT head without acute findings -Chest x-ray with CHF and bilateral pleural effusions -Potassium 7.2, sodium 133 creatinine 7.1 -Troponin  25 >>  22, EKG sinus rhythm with right bundle branch block -BNP greater than 4500 -WBC and hemoglobin and platelets WNL  Review of systems:    In addition to the HPI above,   A full Review of  Systems was done, all other systems reviewed are negative except as noted above in HPI , .    Social History:  Reviewed by me    Social History   Tobacco Use   Smoking status: Never   Smokeless tobacco: Never  Substance Use Topics   Alcohol use: Yes    Comment: Occassionally     Family History :  Reviewed by me    Family History  Problem Relation Age of Onset   Cancer Mother    Hypertension Mother    Cancer Sister        breast and then spread everywhere.   Diabetes Paternal Grandmother    Hypertension Paternal Grandmother      Home Medications:   Prior to Admission medications   Medication Sig Start Date End Date Taking? Authorizing Provider  acetaminophen  (TYLENOL ) 500 MG tablet Take 1,000 mg by mouth every 6 (six) hours as needed for moderate pain.   Yes [provider]  albuterol  (PROVENTIL ) (2.5 MG/3ML) 0.083% nebulizer solution Take 3 mLs (2.5 mg total)  by nebulization every 4 (four) hours as needed for wheezing or shortness of breath. 04/15/23 04/14/24 Yes Ayodele Sangalang, MD  amLODipine  (NORVASC ) 5 MG tablet Take 1 tablet (5 mg total) by mouth daily. Take 1 tablet on Monday,Wednesday and Friday then take 2 tablets on Tuesday,Thursday,Saturday and Sunday. 04/15/23  Yes Pearlean Manus, MD  carvedilol  (COREG ) 12.5 MG tablet Take 1 tablet (12.5 mg total) by mouth 2 (two) times daily with a meal. 04/15/23 08/03/23 Yes Jeweldean Drohan, MD  cloNIDine  (CATAPRES ) 0.1 MG tablet Take 1 tablet (0.1 mg total) by mouth 2 (two) times daily. 04/15/23  Yes Pearlean Manus, MD  cyclobenzaprine  (  FLEXERIL ) 5 MG tablet Take 5 mg by mouth 3 (three) times daily as needed for muscle spasms. 04/11/23  Yes [provider]  furosemide  (LASIX ) 20 MG tablet Take 20 mg by mouth daily. 05/13/23  Yes [provider]  irbesartan  (AVAPRO ) 150 MG tablet Take 150 mg by mouth daily. 05/13/23  Yes [provider]  levETIRAcetam  (KEPPRA ) 500 MG tablet Take 2 tablets (1,000 mg total) by mouth every evening. Take an additional tablet on Monday,Wednesday and Friday evening after Dialysis 04/15/23  Yes Kaysee Hergert, MD  lidocaine -prilocaine  (EMLA ) cream Apply 1 Application topically 3 (three) times a week. 06/04/22  Yes [provider]  naloxone  (NARCAN ) nasal spray 4 mg/0.1 mL Place 0.4 mg into the nose once. 10/03/22  Yes [provider]  Nebulizers (COMPRESSOR/NEBULIZER) MISC 1 Units by Does not apply route as needed. 04/15/23  Yes Kellis Topete, MD  pantoprazole  (PROTONIX ) 40 MG tablet Take 1 tablet (40 mg total) by mouth daily. 05/24/23  Yes Shah, Pratik D, DO  senna-docusate (SENOKOT-S) 8.6-50 MG tablet Take 2 tablets by mouth at bedtime. 11/12/22  Yes Pearlean Manus, MD  sevelamer  carbonate (RENVELA ) 800 MG tablet Take 800 mg by mouth 3 (three) times daily. 12/11/22  Yes [provider]     Allergies:     Allergies   Allergen Reactions   Benadryl [Diphenhydramine Hcl (Sleep)] Hives   Daptomycin Hives   Linezolid Other (See Comments)    Patient self-discontinued treatment due to GI intolerance. Taking it along with moxifloxacin   Moxifloxacin Other (See Comments)    Patient self-discontinued treatment due to GI intolerance. Taking it along with linezolid   Quinine Derivatives Other (See Comments)    Alters mental status   Vancomycin  Other (See Comments)    Pt is tolerating this medication at HD   Azithromycin  Itching and Rash   Tetracycline Itching    Able to tolerate Doxycycline .    Zosyn  [Piperacillin  Sod-Tazobactam So] Rash     Physical Exam:   Vitals  Blood pressure (!) 160/89, pulse 82, temperature 97.7 F (36.5 C), temperature source Axillary, resp. rate 20, height 5' 2 (1.575 m), weight 41 kg, SpO2 94%.  Physical Examination: General appearance - alert,  in no distress  Mental status - alert, oriented to person, place, and time,  Eyes - sclera anicteric Neck - supple, no JVD elevation , Chest - clear  to auscultation bilaterally, symmetrical air movement,  Heart - S1 and S2 normal, regular  Abdomen - soft, nontender, nondistended, +BS, Suprapubic catheter  Extremity/Skin:- No  edema, pedal pulses present , bilateral sacral/buttock decubitus ulcers without significant erythema or drainage---   Psych-affect is appropriate, oriented x3 Neuro-generalized weakness, neuromuscular deficits with contractures consistent with paraplegic status  GU--chronic suprapubic catheter in situ MSK-Lt UE AV fistula with bruit and thrill    Data Review:    CBC Recent Labs  Lab 06/10/23 0941  WBC 8.4  HGB 13.2  HCT 42.7  PLT 188  MCV 89.0  MCH 27.5  MCHC 30.9  RDW 17.5*  LYMPHSABS 1.2  MONOABS 0.5  EOSABS 0.3  BASOSABS 0.1   ------------------------------------------------------------------------------------------------------------------  Chemistries  Recent Labs  Lab 06/10/23 0941  06/10/23 1126  NA 135 133*  K 7.0* 7.2*  CL 99 99  CO2 20* 21*  GLUCOSE 99 97  BUN 97* 96*  CREATININE 6.97* 7.14*  CALCIUM  9.1 8.8*  AST 16  --   ALT 10  --   ALKPHOS 109  --  BILITOT 0.9  --    ------------------------------------------------------------------------------------------------------------------ estimated creatinine clearance is 5.8 mL/min (A) (by C-G formula based on SCr of 7.14 mg/dL (H)). ------------------------------------------------------------------------------------------------------------------ ------------------------------------------------------------------------------------------------------------------    Component Value Date/Time   BNP >4,500.0 (H) 06/10/2023 0941   Urinalysis    Component Value Date/Time   COLORURINE YELLOW 05/14/2023 0037   APPEARANCEUR CLOUDY (A) 05/14/2023 0037   LABSPEC 1.012 05/14/2023 0037   PHURINE 8.0 05/14/2023 0037   GLUCOSEU >=500 (A) 05/14/2023 0037   HGBUR SMALL (A) 05/14/2023 0037   BILIRUBINUR NEGATIVE 05/14/2023 0037   KETONESUR NEGATIVE 05/14/2023 0037   PROTEINUR >=300 (A) 05/14/2023 0037   UROBILINOGEN 0.2 03/31/2015 1200   NITRITE NEGATIVE 05/14/2023 0037   LEUKOCYTESUR LARGE (A) 05/14/2023 0037    Imaging Results:    CT Head Wo Contrast Result Date: 06/10/2023 CLINICAL DATA:  Headache, new onset (Age >= 51y) EXAM: CT HEAD WITHOUT CONTRAST TECHNIQUE: Contiguous axial images were obtained from the base of the skull through the vertex without intravenous contrast. RADIATION DOSE REDUCTION: This exam was performed according to the departmental dose-optimization program which includes automated exposure control, adjustment of the mA and/or kV according to patient size and/or use of iterative reconstruction technique. COMPARISON:  CT head 05/20/2023. FINDINGS: Brain: Remote left frontal infarct, similar. Similar cerebral atrophy. No evidence of acute large vascular territory infarct, acute hemorrhage, mass  lesion, midline shift or hydrocephalus. Vascular: Calcific atherosclerosis. No hyperdense vessel identified. Skull: No acute fracture. Sinuses/Orbits: Clear sinuses.  No acute orbital findings. Other: No mastoid effusions. IMPRESSION: 1. Stable head CT.  No evidence of acute intracranial abnormality. 2. 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 Electronically Signed   By: Gilmore GORMAN Molt M.D.   On: 06/10/2023 10:11   DG Chest 2 View Result Date: 06/10/2023 CLINICAL DATA:  Shortness of breath EXAM: CHEST - 2 VIEW COMPARISON:  Chest radiograph dated 05/20/2023. FINDINGS: There is cardiomegaly with vascular congestion and mild edema. Small bilateral pleural effusions and associated atelectasis or infiltrate. No pneumothorax. No acute osseous pathology. Degenerative changes of the spine and mild scoliosis. Spinal Harrington rod. IMPRESSION: Findings of CHF and small bilateral pleural effusions. Pneumonia is not excluded. Electronically Signed   By: Vanetta Chou M.D.   On: 06/10/2023 09:49    Radiological Exams on Admission: CT Head Wo Contrast Result Date: 06/10/2023 CLINICAL DATA:  Headache, new onset (Age >= 51y) EXAM: CT HEAD WITHOUT CONTRAST TECHNIQUE: Contiguous axial images were obtained from the base of the skull through the vertex without intravenous contrast. RADIATION DOSE REDUCTION: This exam was performed according to the departmental dose-optimization program which includes automated exposure control, adjustment of the mA and/or kV according to patient size and/or use of iterative reconstruction technique. COMPARISON:  CT head 05/20/2023. FINDINGS: Brain: Remote left frontal infarct, similar. Similar cerebral atrophy. No evidence of acute large vascular territory infarct, acute hemorrhage, mass lesion, midline shift or hydrocephalus. Vascular: Calcific atherosclerosis. No hyperdense vessel identified. Skull: No acute fracture. Sinuses/Orbits: Clear sinuses.  No  acute orbital findings. Other: No mastoid effusions. IMPRESSION: 1. Stable head CT.  No evidence of acute intracranial abnormality. 2. 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 Electronically Signed   By: Gilmore GORMAN Molt M.D.   On: 06/10/2023 10:11   DG Chest 2 View Result Date: 06/10/2023 CLINICAL DATA:  Shortness of breath EXAM: CHEST - 2 VIEW COMPARISON:  Chest radiograph dated 05/20/2023. FINDINGS: There is cardiomegaly with vascular congestion and mild edema. Small bilateral pleural effusions and associated  atelectasis or infiltrate. No pneumothorax. No acute osseous pathology. Degenerative changes of the spine and mild scoliosis. Spinal Harrington rod. IMPRESSION: Findings of CHF and small bilateral pleural effusions. Pneumonia is not excluded. Electronically Signed   By: Vanetta Chou M.D.   On: 06/10/2023 09:49   DVT Prophylaxis -SCD  AM Labs Ordered, also please review Full Orders  Family Communication: Admission, patients condition and plan of care including tests being ordered have been discussed with the patient  who indicate understanding and agree with the plan   Condition   stable  Rendall Carwin M.D on 06/10/2023 at 7:34 PM Go to [email or web address] -  for contact info  [facility] - Office  [phone]

## 2023-06-10 NOTE — ED Triage Notes (Signed)
 Pt arrived via POV from home c/o hypertension and SOB. Pt reports she was unable to reach her dialysis appointment today due to road conditions. Pt reports she has not missed any of her routine scheduled medications. LUE restriction.  BP in Triage : 197/102 (131), SpO2% 93% RA

## 2023-06-10 NOTE — Progress Notes (Signed)
  HEMODIALYSIS TREATMENT NOTE:   Uneventful 3.5 hour heparin -free treatment completed using left upper arm AVF (15g/antegrade).  1K bath for first 47m, then 2K thereafter.  Hemodynamically stable throughout session. Goal met: 3 liters removed without interruption in UF.  All blood was returned.  Hemostasis was achieved in 30m.  Pt was transported to 327 where hand-off was given to Rosina Daring, RN Post-HD:  06/10/23 1918  Vital Signs  Temp (!) 97.5 F (36.4 C)  Temp Source Axillary  Pulse Rate 89  Pulse Rate Source Monitor  Resp 18  BP (!) 158/89  BP Location Right Wrist  BP Method Automatic  Patient Position (if appropriate) Lying  Oxygen Therapy  SpO2 97 %  O2 Device Room Air  Pain Assessment  Pain Scale 0-10  Pain Score 0  Dialysis Weight  Weight 42 kg (bedscale weight in 327)  Type of Weight Post-Dialysis  Post Treatment  Dialyzer Clearance Lightly streaked  Hemodialysis Intake (mL) 0 mL  Liters Processed 78.7  Fluid Removed (mL) 3000 mL  Tolerated HD Treatment Yes  Post-Hemodialysis Comments Goal met  AVG/AVF Arterial Site Held (minutes) 5 minutes  AVG/AVF Venous Site Held (minutes) 5 minutes  Fistula / Graft Left Upper arm Arteriovenous fistula  No placement date or time found.   Placed prior to admission: Yes  Orientation: Left  Access Location: Upper arm  Access Type: Arteriovenous fistula  Fistula / Graft Assessment Bruit;Thrill  Status Patent   Jon Laos, RN [facility]

## 2023-06-11 DIAGNOSIS — G40909 Epilepsy, unspecified, not intractable, without status epilepticus: Secondary | ICD-10-CM

## 2023-06-11 DIAGNOSIS — E8770 Fluid overload, unspecified: Secondary | ICD-10-CM

## 2023-06-11 DIAGNOSIS — Z992 Dependence on renal dialysis: Secondary | ICD-10-CM | POA: Diagnosis not present

## 2023-06-11 DIAGNOSIS — I10 Essential (primary) hypertension: Secondary | ICD-10-CM

## 2023-06-11 DIAGNOSIS — N186 End stage renal disease: Secondary | ICD-10-CM

## 2023-06-11 DIAGNOSIS — E875 Hyperkalemia: Secondary | ICD-10-CM | POA: Diagnosis not present

## 2023-06-11 DIAGNOSIS — G8220 Paraplegia, unspecified: Secondary | ICD-10-CM | POA: Diagnosis not present

## 2023-06-11 LAB — CBC
HCT: 38.1 % (ref 36.0–46.0)
Hemoglobin: 11.8 g/dL — ABNORMAL LOW (ref 12.0–15.0)
MCH: 27.6 pg (ref 26.0–34.0)
MCHC: 31 g/dL (ref 30.0–36.0)
MCV: 89 fL (ref 80.0–100.0)
Platelets: 157 10*3/uL (ref 150–400)
RBC: 4.28 MIL/uL (ref 3.87–5.11)
RDW: 17.2 % — ABNORMAL HIGH (ref 11.5–15.5)
WBC: 4.3 10*3/uL (ref 4.0–10.5)
nRBC: 0 % (ref 0.0–0.2)

## 2023-06-11 LAB — GLUCOSE, CAPILLARY
Glucose-Capillary: 105 mg/dL — ABNORMAL HIGH (ref 70–99)
Glucose-Capillary: 115 mg/dL — ABNORMAL HIGH (ref 70–99)
Glucose-Capillary: 75 mg/dL (ref 70–99)
Glucose-Capillary: 93 mg/dL (ref 70–99)

## 2023-06-11 LAB — BASIC METABOLIC PANEL
Anion gap: 12 (ref 5–15)
BUN: 35 mg/dL — ABNORMAL HIGH (ref 6–20)
CO2: 26 mmol/L (ref 22–32)
Calcium: 8.5 mg/dL — ABNORMAL LOW (ref 8.9–10.3)
Chloride: 95 mmol/L — ABNORMAL LOW (ref 98–111)
Creatinine, Ser: 3.68 mg/dL — ABNORMAL HIGH (ref 0.44–1.00)
GFR, Estimated: 14 mL/min — ABNORMAL LOW (ref >60)
Glucose, Bld: 151 mg/dL — ABNORMAL HIGH (ref 70–99)
Potassium: 3.9 mmol/L (ref 3.5–5.1)
Sodium: 133 mmol/L — ABNORMAL LOW (ref 135–145)

## 2023-06-11 LAB — HEPATITIS B SURFACE ANTIBODY, QUANTITATIVE: Hep B S AB Quant (Post): 295 m[IU]/mL

## 2023-06-11 MED ORDER — PENTAFLUOROPROP-TETRAFLUOROETH EX AERO
INHALATION_SPRAY | CUTANEOUS | Status: AC
Start: 2023-06-11 — End: ?
  Filled 2023-06-11: qty 30

## 2023-06-11 MED ORDER — GERHARDT'S BUTT CREAM
TOPICAL_CREAM | Freq: Four times a day (QID) | CUTANEOUS | Status: DC | PRN
  Filled 2023-06-11: qty 60

## 2023-06-11 NOTE — Discharge Instructions (Addendum)
 [name]

## 2023-06-11 NOTE — Progress Notes (Signed)
   HEMODIALYSIS TREATMENT NOTE:  3 hour heparin -free treatment completed.  1.7 liters removed.  Goal was lowered d/t pt c/o non-specifically "feeling bad".  BP elevated, anti-hypertensive meds which were held this morning will be given by primary nurse.  Today's post-HD weight was called to Corbin Dess, Charity fundraiser at [employer].  Post-HD:  06/11/23 1358  Vital Signs  Temp 98 F (36.7 C)  Temp Source Oral  Pulse Rate 85  Pulse Rate Source Monitor  Resp 15  BP (!) 189/98  BP Location Right Wrist  BP Method Automatic  Patient Position (if appropriate) Sitting  Oxygen Therapy  SpO2 100 %  O2 Device Room Air  Pain Assessment  Pain Score 0  Dialysis Weight  Weight 40 kg (reported to Corbin Dess, Charity fundraiser at [employer])  Type of Weight Post-Dialysis  Estimated Dry Weight 42 kg  Post Treatment  Dialyzer Clearance Lightly streaked  Hemodialysis Intake (mL) 0 mL  Liters Processed 66.6  Fluid Removed (mL) 1700 mL  Tolerated HD Treatment No (Comment)  Post-Hemodialysis Comments See PN  AVG/AVF Arterial Site Held (minutes) 5 minutes  AVG/AVF Venous Site Held (minutes) 5 minutes  Fistula / Graft Left Upper arm Arteriovenous fistula  No placement date or time found.   Placed prior to admission: Yes  Orientation: Left  Access Location: Upper arm  Access Type: Arteriovenous fistula  Fistula / Graft Assessment Thrill;Bruit  Status Patent  Education / Care Plan  Dialysis Education Provided Yes (re: hyperkalemia risks, Na/fluid restriction)  Documented Education in Care Plan Yes  Outpatient Plan of Care Reviewed and on Chart Yes    Waunita Haff, RN [facility]

## 2023-06-11 NOTE — Care Management Obs Status (Signed)
 MEDICARE OBSERVATION STATUS NOTIFICATION   Patient Details  Name: [name] MRN: [id number] Date of Birth: Nov 13, 1965   Medicare Observation Status Notification Given:  Yes Leonie Rams., CMA, verbally reviewed observation notice with Heath Litten. Copy provided.)    Analee Montee L Mirabella Hilario 06/11/2023, 3:59 PM

## 2023-06-11 NOTE — Discharge Summary (Signed)
 Physician Discharge Summary   Patient: [name] MRN: [id number] DOB: [birth year]  Admit date:     06/10/2023  Discharge date: 06/11/23  Discharge Physician: Justina Oman   PCP: Patient, No Pcp Per   Recommendations at discharge:  Reassess blood pressure and further adjust medication as needed Repeat CBC to follow hemoglobin trend/stability Repeat basic metabolic panel to assess electrolytes stability.  Discharge Diagnoses: Principal Problem:   Hyperkalemia Active Problems:   ESRD (end stage renal disease) (HCC)   Seizure disorder (HCC)   Paraplegia (HCC)   Essential hypertension   Volume overload  Brief Hospital admission course: As per H&P written by Dr. Quintella Buck on 06/10/2023 [name]  is a 58 y.o. female  with medical history significant for  HTN,  ESRD, HTN, paraplegia with neuromuscular deficits, suprapubic cath, ESRD, decubitus ulcers to the ED with shortness of breath after missing hemodialysis sessions -Apparently could not make it to hemodialysis on 06/09/2023 -BP in the ED was 197/102 -Patient endorses dyspnea, frontal headache no productive cough no fevers no chest pains -No vomiting or diarrhea -CT head without acute findings -Chest x-ray with CHF and bilateral pleural effusions -Potassium 7.2, sodium 133 creatinine 7.1 -Troponin  25 >>  22, EKG sinus rhythm with right bundle branch block -BNP greater than 4500 -WBC and hemoglobin and platelets WNL  Assessment and Plan: 1-volume overload/pulmonary edema -Associated with missing hemodialysis on Monday, 06/09/2023 -Patient has received back-to-back treatments on 06/10/2023 and again on 06/11/2023 in order to get her back on schedule -No requiring any oxygen supplementation and reporting breathing pattern back to baseline. -Patient has been encouraged to be compliant with outpatient dialysis treatments.  2-hyperkalemia -Potassium 7.2 at time of admission -Received treatment with insulin , Lokelma  and  calcium  chloride -Hemodialysis has been provided and electrolytes stabilized prior to discharge.  3-hypertension -Resume home antihypertensive agents with the exception of Avapro  -Given presentation with hyperkalemia. -Reassess blood pressure and if needed will recommend the use of Imdur. -Heart healthy/low-sodium diet discussed with patient.  4-seizure disorder -Continue treatment with Keppra .  5-GERD -Continue PPI.  6-bilateral sacral and buttock decubitus ulcer -Both present at time of admission, appears clean and without evidence of surrounding cellulitis or infection -Continue constant repositioning and wound care as previously provided.  7-end-stage renal disease -Resume outpatient hemodialysis -Patient normal schedule Monday, Wednesday and Friday.  8-chronic anemia of ESRD -Hemoglobin stable and not requiring any transfusion -No overt bleeding appreciated -Continue IV iron  and Epogen treatment as per nephrology discretion.  9-paraplegia/ambulatory dysfunction and neurogenic bladder -Chronic indwelling catheter in place -No signs of acute infection or complaints -Continue supportive care and outpatient follow-up.  Consultants: Nephrology service Procedures performed: See below for x-ray reports. Disposition: Home Diet recommendation: Renal diet with close consideration to sodium and potassium intake.  DISCHARGE MEDICATION: Allergies as of 06/11/2023       Reactions   Benadryl [diphenhydramine Hcl (sleep)] Hives   Daptomycin Hives   Linezolid Other (See Comments)   Patient self-discontinued treatment due to GI intolerance. Taking it along with moxifloxacin   Moxifloxacin Other (See Comments)   Patient self-discontinued treatment due to GI intolerance. Taking it along with linezolid   Quinine Derivatives Other (See Comments)   Alters mental status   Vancomycin  Other (See Comments)   Pt is tolerating this medication at HD   Azithromycin  Itching, Rash    Tetracycline Itching   Able to tolerate Doxycycline .    Zosyn  [piperacillin  Sod-tazobactam So] Rash        Medication  List     STOP taking these medications    irbesartan  150 MG tablet Commonly known as: AVAPRO        TAKE these medications    acetaminophen  500 MG tablet Commonly known as: TYLENOL  Take 1,000 mg by mouth every 6 (six) hours as needed for moderate pain.   albuterol  (2.5 MG/3ML) 0.083% nebulizer solution Commonly known as: PROVENTIL  Take 3 mLs (2.5 mg total) by nebulization every 4 (four) hours as needed for wheezing or shortness of breath.   amLODipine  5 MG tablet Commonly known as: NORVASC  Take 1 tablet (5 mg total) by mouth daily. Take 1 tablet on Monday,Wednesday and Friday then take 2 tablets on Tuesday,Thursday,Saturday and Sunday.   carvedilol  12.5 MG tablet Commonly known as: COREG  Take 1 tablet (12.5 mg total) by mouth 2 (two) times daily with a meal.   cloNIDine  0.1 MG tablet Commonly known as: CATAPRES  Take 1 tablet (0.1 mg total) by mouth 2 (two) times daily.   Compressor/Nebulizer Misc 1 Units by Does not apply route as needed.   cyclobenzaprine  5 MG tablet Commonly known as: FLEXERIL  Take 5 mg by mouth 3 (three) times daily as needed for muscle spasms.   furosemide  20 MG tablet Commonly known as: LASIX  Take 20 mg by mouth daily.   levETIRAcetam  500 MG tablet Commonly known as: KEPPRA  Take 2 tablets (1,000 mg total) by mouth every evening. Take an additional tablet on Monday,Wednesday and Friday evening after Dialysis   lidocaine -prilocaine  cream Commonly known as: EMLA  Apply 1 Application topically 3 (three) times a week.   naloxone  4 MG/0.1ML Liqd nasal spray kit Commonly known as: NARCAN  Place 0.4 mg into the nose once.   pantoprazole  40 MG tablet Commonly known as: PROTONIX  Take 1 tablet (40 mg total) by mouth daily.   senna-docusate 8.6-50 MG tablet Commonly known as: Senokot-S Take 2 tablets by mouth at bedtime.    sevelamer  carbonate 800 MG tablet Commonly known as: RENVELA  Take 800 mg by mouth 3 (three) times daily.        Discharge Exam: Filed Weights   06/10/23 1937 06/11/23 1035 06/11/23 1358  Weight: 42 kg 42.3 kg 40 kg   General exam: Alert, awake, oriented x 3; afebrile, no chest pain, no shortness of breath. Respiratory system: Normal respiratory effort; good saturation on room air. Cardiovascular system:RRR.  No rubs or gallops. Gastrointestinal system: Abdomen is nondistended, soft and nontender.  Positive bowel sounds. Central nervous system: No new focal deficit. Extremities: No cyanosis or clubbing. Skin: No petechiae.  Decubitus pressure injury appreciated in her sacrum and buttocks bilaterally.  No signs of superimposed infection.  Changes present at time of admission. Psychiatry:  Mood & affect appropriate.    Condition at discharge: Stable.  The results of significant diagnostics from this hospitalization (including imaging, microbiology, ancillary and laboratory) are listed below for reference.   Imaging Studies: CT Head Wo Contrast Result Date: 06/10/2023 CLINICAL DATA:  Headache, new onset (Age >= 51y) EXAM: CT HEAD WITHOUT CONTRAST TECHNIQUE: Contiguous axial images were obtained from the base of the skull through the vertex without intravenous contrast. RADIATION DOSE REDUCTION: This exam was performed according to the departmental dose-optimization program which includes automated exposure control, adjustment of the mA and/or kV according to patient size and/or use of iterative reconstruction technique. COMPARISON:  CT head 05/20/2023. FINDINGS: Brain: Remote left frontal infarct, similar. Similar cerebral atrophy. No evidence of acute large vascular territory infarct, acute hemorrhage, mass lesion, midline shift or hydrocephalus. Vascular: Calcific  atherosclerosis. No hyperdense vessel identified. Skull: No acute fracture. Sinuses/Orbits: Clear sinuses.  No acute orbital  findings. Other: No mastoid effusions. IMPRESSION: 1. Stable head CT.  No evidence of acute intracranial abnormality. 2. 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 Electronically Signed   By: Stevenson Elbe M.D.   On: 06/10/2023 10:11   DG Chest 2 View Result Date: 06/10/2023 CLINICAL DATA:  Shortness of breath EXAM: CHEST - 2 VIEW COMPARISON:  Chest radiograph dated 05/20/2023. FINDINGS: There is cardiomegaly with vascular congestion and mild edema. Small bilateral pleural effusions and associated atelectasis or infiltrate. No pneumothorax. No acute osseous pathology. Degenerative changes of the spine and mild scoliosis. Spinal Harrington rod. IMPRESSION: Findings of CHF and small bilateral pleural effusions. Pneumonia is not excluded. Electronically Signed   By: Angus Bark M.D.   On: 06/10/2023 09:49   CT Chest Wo Contrast Result Date: 05/20/2023 CLINICAL DATA:  Respiratory distress. EXAM: CT CHEST WITHOUT CONTRAST TECHNIQUE: Multidetector CT imaging of the chest was performed following the standard protocol without IV contrast. RADIATION DOSE REDUCTION: This exam was performed according to the departmental dose-optimization program which includes automated exposure control, adjustment of the mA and/or kV according to patient size and/or use of iterative reconstruction technique. COMPARISON:  Chest radiograph dated 05/20/2023. chest CT dated 03/05/2023. FINDINGS: Evaluation of this exam is limited in the absence of intravenous contrast and streak artifact caused by spinal hardware. Cardiovascular: There is moderate cardiomegaly. Small pericardial effusion. There is mild atherosclerotic calcification of the thoracic aorta. No aneurysmal dilatation. The central pulmonary arteries are grossly unremarkable on this noncontrast CT. Mediastinum/Nodes: No obvious hilar or mediastinal adenopathy. Evaluation however is limited due to factors above. The esophagus is grossly  unremarkable. Lungs/Pleura: There is near complete consolidation of the right lower lobe with air bronchogram and volume loss. Bilateral pleural effusions, right greater than left, and similar to prior CT. There are bibasilar atelectasis. No pneumothorax. The central airways remain patent. Upper Abdomen: No acute abnormality. Musculoskeletal: There is diffuse subcutaneous edema and anasarca. Osteopenia with degenerative changes and posterior spinal fusion hardware. No acute osseous pathology. IMPRESSION: 1. Near complete consolidation of the right lower lobe may represent atelectasis, or pneumonia. This has progressed since the prior CT. Continued follow-up to resolution recommended. 2. Bilateral pleural effusions, right greater than left, and similar to prior CT. 3. Moderate cardiomegaly. 4.  Aortic Atherosclerosis (ICD10-I70.0). Electronically Signed   By: Angus Bark M.D.   On: 05/20/2023 16:57   CT Head Wo Contrast Result Date: 05/20/2023 CLINICAL DATA:  Headache with worsening frequency or severity. EXAM: CT HEAD WITHOUT CONTRAST TECHNIQUE: Contiguous axial images were obtained from the base of the skull through the vertex without intravenous contrast. RADIATION DOSE REDUCTION: This exam was performed according to the departmental dose-optimization program which includes automated exposure control, adjustment of the mA and/or kV according to patient size and/or use of iterative reconstruction technique. COMPARISON:  05/14/2023 FINDINGS: Brain: No acute finding to explain worsening headache. No focal abnormality affects the brainstem or cerebellum. 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 No sign of acute infarction, mass lesion, hemorrhage, hydrocephalus or extra-axial collection. Vascular: No abnormal vascular finding. Skull: Negative Sinuses/Orbits: Clear/normal Other:  None IMPRESSION: 1. No acute finding to explain worsening headache. 2. 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 Electronically Signed   By: Lavonia Powers  Shogry M.D.   On: 05/20/2023 16:30   DG Chest 2 View Result Date: 05/20/2023 CLINICAL DATA:  Hypoxia. EXAM: CHEST - 2 VIEW COMPARISON:  05/14/2023 FINDINGS: Enlarged cardiac silhouette. Bilateral moderate-sized pleural effusions. Pulmonary vascular congestion. No pneumothorax. Calcified aorta. Thoracic spine fixation hardware. No acute osseous abnormalities. IMPRESSION: Findings consistent with CHF. Electronically Signed   By: Sydell Eva M.D.   On: 05/20/2023 14:59   DG Chest Port 1 View Result Date: 05/14/2023 CLINICAL DATA:  Fever at home, EXAM: PORTABLE CHEST 1 VIEW COMPARISON:  05/05/2023 FINDINGS: Stable cardiomegaly. Aortic atherosclerotic calcification. Small right pleural effusion slightly increased from 05/05/2023. Probable small left pleural effusion. Associated atelectasis or pneumonia. No pneumothorax. Thoracic fusion hardware. IMPRESSION: Increased right pleural effusion. Associated atelectasis or pneumonia. Cardiomegaly. Electronically Signed   By: Rozell Cornet M.D.   On: 05/14/2023 00:45   CT HEAD WO CONTRAST ( ) Result Date: 05/14/2023 CLINICAL DATA:  Headache and altered mental status EXAM: CT HEAD WITHOUT CONTRAST TECHNIQUE: Contiguous axial images were obtained from the base of the skull through the vertex without intravenous contrast. RADIATION DOSE REDUCTION: This exam was performed according to the departmental dose-optimization program which includes automated exposure control, adjustment of the mA and/or kV according to patient size and/or use of iterative reconstruction technique. 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 No mass, hemorrhage or extra-axial collection. Mild generalized  volume loss, also unchanged. Vascular: Negative Skull: Negative Sinuses/Orbits: Paranasal sinuses are clear. No mastoid effusion. Normal orbits. Other: None. IMPRESSION: 1. No acute intracranial abnormality. 2. 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 Electronically Signed   By: Juanetta Nordmann M.D.   On: 05/14/2023 00:33    Microbiology: Results for orders placed or performed during the hospital encounter of 05/20/23  Blood culture (routine x 2)     Status: None   Collection Time: 05/20/23  6:21 PM   Specimen: BLOOD  Result Value Ref Range Status   Specimen Description BLOOD RIGHT ANTECUBITAL  Final   Special Requests   Final    BOTTLES DRAWN AEROBIC AND ANAEROBIC Blood Culture adequate volume   Culture   Final    NO GROWTH 5 DAYS Performed at [facility], [address], [place], [place] [address]    Report Status 05/25/2023 FINAL  Final  Blood culture (routine x 2)     Status: None   Collection Time: 05/20/23  6:21 PM   Specimen: BLOOD  Result Value Ref Range Status   Specimen Description BLOOD RIGHT ANTECUBITAL  Final   Special Requests   Final    BOTTLES DRAWN AEROBIC AND ANAEROBIC Blood Culture adequate volume   Culture   Final    NO GROWTH 5 DAYS Performed at [facility], [address] [place], [place] [address]    Report Status 05/25/2023 FINAL  Final    Labs: CBC: Recent Labs  Lab 06/10/23 0941 06/11/23 0513  WBC 8.4 4.3  NEUTROABS 6.4  --   HGB 13.2 11.8*  HCT 42.7 38.1  MCV 89.0 89.0  PLT 188 157   Basic Metabolic Panel: Recent Labs  Lab 06/10/23 0941 06/10/23 1126 06/11/23 0513  NA 135 133* 133*  K 7.0* 7.2* 3.9  CL 99 99 95*  CO2 20* 21* 26  GLUCOSE 99 97 151*  BUN 97* 96* 35*  CREATININE 6.97* 7.14* 3.68*  CALCIUM  9.1 8.8* 8.5*   Liver Function Tests: Recent Labs  Lab 06/10/23 0941  AST 16  ALT  10  ALKPHOS 109  BILITOT 0.9  PROT 8.3*  ALBUMIN 3.3*   CBG: Recent Labs  Lab 06/10/23 1946 06/11/23 0001  06/11/23 0357 06/11/23 0726 06/11/23 1146  GLUCAP 139* 93 75 115* 105*    Discharge time spent: greater than 30 minutes.  Signed: Justina Oman, MD [facility] 06/11/2023

## 2023-06-11 NOTE — Consult Note (Signed)
 [name] Renal Consultation Note    Indication for Consultation:  Management of ESRD/hemodialysis; anemia, hypertension/volume and secondary hyperparathyroidism  HPI: [name] is a 58 y.o. female with a PMH significant for HTN, paraplegia, chronic suprapubic catheter, decubitus ulcers, and ESRD on HD MWF at [facility]  who presented to [facility] ED on 06/10/23 with SOB, headache and elevated BP of 197/102.  She missed HD on 06/09/23 due to icy roads but instead of going to her outpatient unit she came to [facility].  In the ED, labs were notable for K 7.2 and CXR with CHF.  She had urgent HD yesterday with UF of 3 liters.  For unclear reasons, she was not discharged afterwards.  We are now asked to provide HD again today prior to discharge.  Past Medical History:  Diagnosis Date   Abnormal uterine bleeding (AUB) 06/15/2014   Cancer (HCC)    uterine   High blood pressure    Paraplegia (lower)    Renal disorder    Seizure disorder (HCC)    Seizures (HCC)    Suprapubic catheter (HCC)    Urinary tract infection    Past Surgical History:  Procedure Laterality Date   APPLICATION OF WOUND VAC Right 09/13/2021   (approximately 1-33mos ago) pressure sore on right hip   BACK SURGERY     Pt stated "before 2000"   BIOPSY  12/03/2022   Procedure: BIOPSY;  Surgeon: Urban Garden, MD;  Location: [facility];  Service: Gastroenterology;;   ESOPHAGOGASTRODUODENOSCOPY N/A 09/20/2015   Procedure: ESOPHAGOGASTRODUODENOSCOPY (EGD);  Surgeon: Ruby Corporal, MD;  Location: [facility];  Service: Endoscopy;  Laterality: N/A;  730   ESOPHAGOGASTRODUODENOSCOPY (EGD) WITH PROPOFOL  N/A 12/03/2022   Procedure: ESOPHAGOGASTRODUODENOSCOPY (EGD) WITH PROPOFOL ;  Surgeon: Urban Garden, MD;  Location: [facility];  Service: Gastroenterology;  Laterality: N/A;  1:15 pm, asa 3, pt knows to arrive at 10:30  dialysis pt, M,W & F   IR CATHETER TUBE CHANGE  04/02/2018    PERCUTANEOUS ENDOSCOPIC GASTROSTOMY (PEG) REMOVAL N/A 09/20/2015   Procedure: PERCUTANEOUS ENDOSCOPIC GASTROSTOMY (PEG) REMOVAL;  Surgeon: Ruby Corporal, MD;  Location: [facility];  Service: Endoscopy;  Laterality: N/A;   TEE WITHOUT CARDIOVERSION N/A 11/11/2022   Procedure: TRANSESOPHAGEAL ECHOCARDIOGRAM (TEE);  Surgeon: Elmyra Haggard, MD;  Location: [facility] ORS;  Service: Cardiovascular;  Laterality: N/A;   Family History:   Family History  Problem Relation Age of Onset   Cancer Mother    Hypertension Mother    Cancer Sister        breast and then spread everywhere.   Diabetes Paternal Grandmother    Hypertension Paternal Grandmother    Social History:  reports that she has never smoked. She has never used smokeless tobacco. She reports current alcohol use. She reports that she does not use drugs. Allergies  Allergen Reactions   Benadryl [Diphenhydramine Hcl (Sleep)] Hives   Daptomycin Hives   Linezolid Other (See Comments)    Patient self-discontinued treatment due to GI intolerance. Taking it along with moxifloxacin   Moxifloxacin Other (See Comments)    Patient self-discontinued treatment due to GI intolerance. Taking it along with linezolid   Quinine Derivatives Other (See Comments)    Alters mental status   Vancomycin  Other (See Comments)    Pt is tolerating this medication at HD   Azithromycin  Itching and Rash   Tetracycline Itching    Able to tolerate Doxycycline .    Zosyn  [Piperacillin  Sod-Tazobactam So] Rash  Prior to Admission medications   Medication Sig Start Date End Date Taking? Authorizing Provider  acetaminophen  (TYLENOL ) 500 MG tablet Take 1,000 mg by mouth every 6 (six) hours as needed for moderate pain.   Yes [provider]  albuterol  (PROVENTIL ) (2.5 MG/3ML) 0.083% nebulizer solution Take 3 mLs (2.5 mg total) by nebulization every 4 (four) hours as needed for wheezing or shortness of breath. 04/15/23 04/14/24 Yes Emokpae, Courage, MD  amLODipine   (NORVASC ) 5 MG tablet Take 1 tablet (5 mg total) by mouth daily. Take 1 tablet on Monday,Wednesday and Friday then take 2 tablets on Tuesday,Thursday,Saturday and Sunday. 04/15/23  Yes Colin Dawley, MD  carvedilol  (COREG ) 12.5 MG tablet Take 1 tablet (12.5 mg total) by mouth 2 (two) times daily with a meal. 04/15/23 08/03/23 Yes Emokpae, Courage, MD  cloNIDine  (CATAPRES ) 0.1 MG tablet Take 1 tablet (0.1 mg total) by mouth 2 (two) times daily. 04/15/23  Yes Emokpae, Courage, MD  cyclobenzaprine  (FLEXERIL ) 5 MG tablet Take 5 mg by mouth 3 (three) times daily as needed for muscle spasms. 04/11/23  Yes [provider]  furosemide  (LASIX ) 20 MG tablet Take 20 mg by mouth daily. 05/13/23  Yes [provider]  irbesartan  (AVAPRO ) 150 MG tablet Take 150 mg by mouth daily. 05/13/23  Yes [provider]  levETIRAcetam  (KEPPRA ) 500 MG tablet Take 2 tablets (1,000 mg total) by mouth every evening. Take an additional tablet on Monday,Wednesday and Friday evening after Dialysis 04/15/23  Yes Emokpae, Courage, MD  lidocaine -prilocaine  (EMLA ) cream Apply 1 Application topically 3 (three) times a week. 06/04/22  Yes [provider]  naloxone  (NARCAN ) nasal spray 4 mg/0.1 mL Place 0.4 mg into the nose once. 10/03/22  Yes [provider]  Nebulizers (COMPRESSOR/NEBULIZER) MISC 1 Units by Does not apply route as needed. 04/15/23  Yes Emokpae, Courage, MD  pantoprazole  (PROTONIX ) 40 MG tablet Take 1 tablet (40 mg total) by mouth daily. 05/24/23  Yes Shah, Pratik D, DO  senna-docusate (SENOKOT-S) 8.6-50 MG tablet Take 2 tablets by mouth at bedtime. 11/12/22  Yes Colin Dawley, MD  sevelamer  carbonate (RENVELA ) 800 MG tablet Take 800 mg by mouth 3 (three) times daily. 12/11/22  Yes [provider]   Current Facility-Administered Medications  Medication Dose Route Frequency Provider Last Rate Last Admin   0.9 %  sodium chloride  infusion   Intravenous PRN Emokpae, Courage,  MD       acetaminophen  (TYLENOL ) tablet 650 mg  650 mg Oral Q6H PRN Quintella Buck, Courage, MD   650 mg at 06/10/23 2104   Or   acetaminophen  (TYLENOL ) suppository 650 mg  650 mg Rectal Q6H PRN Emokpae, Courage, MD       albuterol  (VENTOLIN  HFA) 108 (90 Base) MCG/ACT inhaler 2 puff  2 puff Inhalation Q2H PRN Emokpae, Courage, MD   2 puff at 06/10/23 1048   amLODipine  (NORVASC ) tablet 5 mg  5 mg Oral Daily Emokpae, Courage, MD       bisacodyl  (DULCOLAX) suppository 10 mg  10 mg Rectal Daily PRN Emokpae, Courage, MD       carvedilol  (COREG ) tablet 12.5 mg  12.5 mg Oral BID WC Emokpae, Courage, MD       Chlorhexidine  Gluconate Cloth 2 % PADS 6 each  6 each Topical Q0600 Emokpae, Courage, MD   6 each at 06/11/23 0548   cloNIDine  (CATAPRES ) tablet 0.1 mg  0.1 mg Oral BID Colin Dawley, MD   0.1 mg at 06/10/23 2106   cyclobenzaprine  (FLEXERIL ) tablet  5 mg  5 mg Oral TID PRN Colin Dawley, MD       furosemide  (LASIX ) tablet 20 mg  20 mg Oral Daily Emokpae, Courage, MD   20 mg at 06/10/23 2104   heparin  injection 5,000 Units  5,000 Units Subcutaneous Q8H Emokpae, Courage, MD   5,000 Units at 06/11/23 0549   levETIRAcetam  (KEPPRA ) tablet 1,000 mg  1,000 mg Oral QPM Emokpae, Courage, MD   1,000 mg at 06/10/23 2105   lidocaine  (PF) (XYLOCAINE ) 1 % injection 5 mL  5 mL Intradermal PRN Colin Dawley, MD       lidocaine -prilocaine  (EMLA ) cream 1 Application  1 Application Topical PRN Quintella Buck, Courage, MD       lidocaine -prilocaine  (EMLA ) cream 1 Application  1 Application Topical Once per day on Monday Wednesday Friday Colin Dawley, MD       ondansetron  (ZOFRAN ) tablet 4 mg  4 mg Oral Q6H PRN Quintella Buck, Courage, MD       Or   ondansetron  (ZOFRAN ) injection 4 mg  4 mg Intravenous Q6H PRN Emokpae, Courage, MD       pantoprazole  (PROTONIX ) EC tablet 40 mg  40 mg Oral Daily Emokpae, Courage, MD   40 mg at 06/11/23 0848   pentafluoroprop-tetrafluoroeth (GEBAUERS) aerosol 1 Application  1 Application Topical  PRN Emokpae, Courage, MD       polyethylene glycol (MIRALAX  / GLYCOLAX ) packet 17 g  17 g Oral Daily PRN Quintella Buck, Courage, MD       senna-docusate (Senokot-S) tablet 2 tablet  2 tablet Oral QHS Quintella Buck, Courage, MD   2 tablet at 06/10/23 2105   sevelamer  carbonate (RENVELA ) tablet 800 mg  800 mg Oral TID with meals Emokpae, Courage, MD   800 mg at 06/11/23 0848   sodium chloride  flush (NS) 0.9 % injection 3 mL  3 mL Intravenous Q12H Emokpae, Courage, MD   3 mL at 06/10/23 2109   sodium chloride  flush (NS) 0.9 % injection 3 mL  3 mL Intravenous Q12H Emokpae, Courage, MD   3 mL at 06/10/23 2109   sodium chloride  flush (NS) 0.9 % injection 3 mL  3 mL Intravenous PRN Emokpae, Courage, MD       traZODone  (DESYREL ) tablet 50 mg  50 mg Oral QHS PRN Colin Dawley, MD       Labs: Basic Metabolic Panel: Recent Labs  Lab 06/10/23 0941 06/10/23 1126 06/11/23 0513  NA 135 133* 133*  K 7.0* 7.2* 3.9  CL 99 99 95*  CO2 20* 21* 26  GLUCOSE 99 97 151*  BUN 97* 96* 35*  CREATININE 6.97* 7.14* 3.68*  CALCIUM  9.1 8.8* 8.5*   Liver Function Tests: Recent Labs  Lab 06/10/23 0941  AST 16  ALT 10  ALKPHOS 109  BILITOT 0.9  PROT 8.3*  ALBUMIN 3.3*   No results for input(s): "LIPASE", "AMYLASE" in the last 168 hours. No results for input(s): "AMMONIA" in the last 168 hours. CBC: Recent Labs  Lab 06/10/23 0941 06/11/23 0513  WBC 8.4 4.3  NEUTROABS 6.4  --   HGB 13.2 11.8*  HCT 42.7 38.1  MCV 89.0 89.0  PLT 188 157   Cardiac Enzymes: No results for input(s): "CKTOTAL", "CKMB", "CKMBINDEX", "TROPONINI" in the last 168 hours. CBG: Recent Labs  Lab 06/10/23 1443 06/10/23 1946 06/11/23 0001 06/11/23 0357 06/11/23 0726  GLUCAP 127* 139* 93 75 115*   Iron  Studies: No results for input(s): "IRON ", "TIBC", "TRANSFERRIN", "FERRITIN" in the last 72 hours. Studies/Results: CT Head Wo  Contrast Result Date: 06/10/2023 CLINICAL DATA:  Headache, new onset (Age >= 51y) EXAM: CT HEAD WITHOUT  CONTRAST TECHNIQUE: Contiguous axial images were obtained from the base of the skull through the vertex without intravenous contrast. RADIATION DOSE REDUCTION: This exam was performed according to the departmental dose-optimization program which includes automated exposure control, adjustment of the mA and/or kV according to patient size and/or use of iterative reconstruction technique. COMPARISON:  CT head 05/20/2023. FINDINGS: Brain: Remote left frontal infarct, similar. Similar cerebral atrophy. No evidence of acute large vascular territory infarct, acute hemorrhage, mass lesion, midline shift or hydrocephalus. Vascular: Calcific atherosclerosis. No hyperdense vessel identified. Skull: No acute fracture. Sinuses/Orbits: Clear sinuses.  No acute orbital findings. Other: No mastoid effusions. IMPRESSION: 1. Stable head CT.  No evidence of acute intracranial abnormality. 2. 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 Electronically Signed   By: Stevenson Elbe M.D.   On: 06/10/2023 10:11   DG Chest 2 View Result Date: 06/10/2023 CLINICAL DATA:  Shortness of breath EXAM: CHEST - 2 VIEW COMPARISON:  Chest radiograph dated 05/20/2023. FINDINGS: There is cardiomegaly with vascular congestion and mild edema. Small bilateral pleural effusions and associated atelectasis or infiltrate. No pneumothorax. No acute osseous pathology. Degenerative changes of the spine and mild scoliosis. Spinal Harrington rod. IMPRESSION: Findings of CHF and small bilateral pleural effusions. Pneumonia is not excluded. Electronically Signed   By: Angus Bark M.D.   On: 06/10/2023 09:49    ROS: Pertinent items are noted in HPI. Physical Exam: Vitals:   06/10/23 1949 06/11/23 0004 06/11/23 0357 06/11/23 0802  BP: (!) 163/87 (!) 143/79 (!) 162/78 (!) 161/89  Pulse: 90 74 75 81  Resp: 19 18 19 18   Temp: 97.7 F (36.5 C) 97.7 F (36.5 C) 97.7 F (36.5 C) 98.7 F (37.1 C)  TempSrc: Oral Oral Oral  Oral  SpO2: 97% 100% 96% 96%  Weight:      Height:          Weight change:   Intake/Output Summary (Last 24 hours) at 06/11/2023 2536 Last data filed at 06/11/2023 0900 Gross per 24 hour  Intake 240 ml  Output 3100 ml  Net -2860 ml   BP (!) 161/89 (BP Location: Right Arm)   Pulse 81   Temp 98.7 F (37.1 C) (Oral)   Resp 18   Ht 5\' 2"  (1.575 m)   Wt 42 kg   LMP  (LMP Unknown)   SpO2 96%   BMI 16.94 kg/m  General appearance: alert, cooperative, and no distress Head: Normocephalic, without obvious abnormality, atraumatic Resp: clear to auscultation bilaterally Cardio: regular rate and rhythm, S1, S2 normal, no murmur, click, rub or gallop GI: soft, non-tender; bowel sounds normal; no masses,  no organomegaly Extremities: extremities normal, atraumatic, no cyanosis or edema and LUE AVF +T/B Dialysis Access:LUE AVF +T/B  Outpatient HD orders:  [facility]  MWF  No heparin   3 hours Noncompliant Frequently signs off or misses EDW 43 kg  BF 400 DF 500 3K/2.5 calcium   Calcitriol  1 mcg each tx Mircera 175 mcg every 2 weeks - due on 12/30 Left AVF   Assessment/Plan:  Hyperkalemia - due to missed HD.  Now resolved  ESRD -  will plan for HD today to get back on her outpatient schedule  Hypertension/volume  -  UF as tolerated.  She is below her edw which may need to be addressed as an outpatient.  Anemia  - Hgb stable, no ESA  Metabolic  bone disease -   continue with home meds  Nutrition -  renal diet  Disposition - hopeful discharge to home after HD.   Benjamin Brands, MD [facility] Pager [phone] 06/11/2023, 9:27 AM

## 2023-06-11 NOTE — Progress Notes (Signed)
 Transition of Care Department [facility]) has reviewed patient and no other TOC needs have been identified at this time. We will continue to monitor patient advancement through interdisciplinary progression rounds. If new patient transition needs arise, please place a TOC consult.   06/11/23 0813  TOC Brief Assessment  Insurance and Status Reviewed  Patient has primary care physician No (Has a new patient appt next week.)  Home environment has been reviewed Lives with daughter.  Prior level of function: Needs some assist.  Prior/Current Home Services No current home services  Social Drivers of Health Review SDOH reviewed no interventions necessary  Readmission risk has been reviewed Yes  Transition of care needs no transition of care needs at this time

## 2023-06-20 ENCOUNTER — Other Ambulatory Visit: Payer: Self-pay

## 2023-06-20 ENCOUNTER — Emergency Department (HOSPITAL_COMMUNITY)
Admission: EM | Admit: 2023-06-20 | Discharge: 2023-06-20 | Disposition: A | Discharge status: Home / Self Care | Payer: 59 | Attending: Emergency Medicine | Admitting: Emergency Medicine

## 2023-06-20 ENCOUNTER — Encounter (HOSPITAL_COMMUNITY): Payer: Self-pay | Admitting: Emergency Medicine

## 2023-06-20 DIAGNOSIS — Z79899 Other long term (current) drug therapy: Secondary | ICD-10-CM | POA: Diagnosis not present

## 2023-06-20 DIAGNOSIS — I160 Hypertensive urgency: Secondary | ICD-10-CM

## 2023-06-20 DIAGNOSIS — E875 Hyperkalemia: Secondary | ICD-10-CM | POA: Diagnosis not present

## 2023-06-20 DIAGNOSIS — I10 Essential (primary) hypertension: Secondary | ICD-10-CM | POA: Diagnosis present

## 2023-06-20 LAB — CBC WITH DIFFERENTIAL/PLATELET
Abs Immature Granulocytes: 0.02 10*3/uL (ref 0.00–0.07)
Basophils Absolute: 0.1 10*3/uL (ref 0.0–0.1)
Basophils Relative: 1 %
Eosinophils Absolute: 0.3 10*3/uL (ref 0.0–0.5)
Eosinophils Relative: 4 %
HCT: 36.9 % (ref 36.0–46.0)
Hemoglobin: 11.2 g/dL — ABNORMAL LOW (ref 12.0–15.0)
Immature Granulocytes: 0 %
Lymphocytes Relative: 16 %
Lymphs Abs: 1.3 10*3/uL (ref 0.7–4.0)
MCH: 27.5 pg (ref 26.0–34.0)
MCHC: 30.4 g/dL (ref 30.0–36.0)
MCV: 90.4 fL (ref 80.0–100.0)
Monocytes Absolute: 0.6 10*3/uL (ref 0.1–1.0)
Monocytes Relative: 7 %
Neutro Abs: 5.8 10*3/uL (ref 1.7–7.7)
Neutrophils Relative %: 72 %
Platelets: 178 10*3/uL (ref 150–400)
RBC: 4.08 MIL/uL (ref 3.87–5.11)
RDW: 18.6 % — ABNORMAL HIGH (ref 11.5–15.5)
WBC: 8 10*3/uL (ref 4.0–10.5)
nRBC: 0 % (ref 0.0–0.2)

## 2023-06-20 LAB — HEPATITIS B SURFACE ANTIGEN: Hepatitis B Surface Ag: NONREACTIVE

## 2023-06-20 LAB — BASIC METABOLIC PANEL
Anion gap: 14 (ref 5–15)
BUN: 59 mg/dL — ABNORMAL HIGH (ref 6–20)
CO2: 25 mmol/L (ref 22–32)
Calcium: 9 mg/dL (ref 8.9–10.3)
Chloride: 98 mmol/L (ref 98–111)
Creatinine, Ser: 4.77 mg/dL — ABNORMAL HIGH (ref 0.44–1.00)
GFR, Estimated: 10 mL/min — ABNORMAL LOW (ref >60)
Glucose, Bld: 102 mg/dL — ABNORMAL HIGH (ref 70–99)
Potassium: 5.3 mmol/L — ABNORMAL HIGH (ref 3.5–5.1)
Sodium: 137 mmol/L (ref 135–145)

## 2023-06-20 MED ORDER — CHLORHEXIDINE GLUCONATE CLOTH 2 % EX PADS: 6.0000 | MEDICATED_PAD | Freq: Every day | CUTANEOUS | Status: DC

## 2023-06-20 MED ORDER — ACETAMINOPHEN 325 MG PO TABS
650.0000 mg | ORAL_TABLET | Freq: Once | ORAL | Status: AC
  Administered 2023-06-20: 650 mg via ORAL

## 2023-06-20 MED ORDER — CALCITRIOL 0.25 MCG PO CAPS
ORAL_CAPSULE | ORAL | Status: AC
  Filled 2023-06-20: qty 6

## 2023-06-20 MED ORDER — CLONIDINE HCL 0.1 MG PO TABS
0.1000 mg | ORAL_TABLET | Freq: Once | ORAL | Status: DC
Start: 2023-06-20 — End: 2023-06-20
  Filled 2023-06-20: qty 1

## 2023-06-20 MED ORDER — CARVEDILOL 12.5 MG PO TABS
12.5000 mg | ORAL_TABLET | Freq: Once | ORAL | Status: DC
Start: 2023-06-20 — End: 2023-06-20
  Filled 2023-06-20: qty 1

## 2023-06-20 MED ORDER — CINACALCET HCL 30 MG PO TABS
30.0000 mg | ORAL_TABLET | ORAL | Status: DC
  Administered 2023-06-20: 30 mg via ORAL
  Filled 2023-06-20 (×2): qty 1

## 2023-06-20 MED ORDER — CALCITRIOL 0.25 MCG PO CAPS
1.5000 ug | ORAL_CAPSULE | ORAL | Status: DC
  Administered 2023-06-20: 1.5 ug via ORAL

## 2023-06-20 MED ORDER — CINACALCET HCL 30 MG PO TABS
ORAL_TABLET | ORAL | Status: AC
  Filled 2023-06-20: qty 1

## 2023-06-20 MED ORDER — PENTAFLUOROPROP-TETRAFLUOROETH EX AERO: 1.0000 | INHALATION_SPRAY | CUTANEOUS | Status: DC | PRN

## 2023-06-20 MED ORDER — ACETAMINOPHEN 325 MG PO TABS
ORAL_TABLET | ORAL | Status: AC
  Filled 2023-06-20: qty 2

## 2023-06-20 NOTE — Procedures (Signed)
HD Note:  Some information was entered later than the data was gathered due to patient care needs. The stated time with the data is accurate.  Received patient in bed to unit.   Alert and oriented.   Informed consent signed and in chart.   Access used: Left upper arm Access issues: None  Patient in pain in her back and head.  Medication given, see MAR.  Patient's legs contracted up to her body.  She requested that they be extended several times for comfort.   Patient became dizzy at the beginning of the last hour.  UF paused. Dr. Romelle Starcher notified.  New goal of 2700 ml UF set.  Patient stated that she did not have pain reduction with Tylenol given.  Patient rested with eyes closed during most of treatment.  TX duration: 3 hours  Alert, without acute distress.  Total UF removed: 2700 ml  Hand-off given to patient's nurse.   Transported back to the room   Tyquan Carmickle L. Dareen Piano, RN [facility].

## 2023-06-20 NOTE — ED Notes (Signed)
2 lpm Nasal cannula applied while pt sleeping d/t RA 88% RA .

## 2023-06-20 NOTE — Progress Notes (Signed)
Noticed patient was in ER. Patient seen and examined in ER. Was discharged last week from here. Spoke to ER MD and her outpatient HD unit. Has not received HD yet today. Last HD was Wed. Typically signs off early. Was sent here by daughter and her wound care RN for BP in the 200s systolic. She was planning on going to dialysis anyway this AM. She reports that some of her BP meds were adjusted/stopped but does not recall the name. She does also endorse that her shortness of breath is "in and out".  Outpatient orders: [facility].  MWF.  3 hours.  EDW 42.5 kg.  LUE AVF.  15-gauge.  Flow rates: 400/500.  2K/2.5 Cal.  Heparin: None.  Meds: Calcitriol 1.5 mcg every treatment, Cinacalcet 30 mg every treatment. IDPN 3 times a week.  Mircera 150 mcg every 2 weeks (last dose 06/13/2023). Typically signs off early from treatment as per outpatient HD unit.  Exam: BP (!) 183/101   Pulse 90   Temp 97.7 F (36.5 C) (Oral)   Resp (!) 24   LMP  (LMP Unknown)   SpO2 90%  Gen: NAD, nontoxic appearing Cardiac: RRR Pulm: slightly decreased breath sounds bibasilar, unlabored, normal wob Abd: soft, NT/ND Ext: no edema Neuro: paraplegic, awake, alert, speech clear and coherent Dialysis access: LUE AVF +b/t  Assessment: ESRD on HD MWF HTN Volume overload  Plan: -HD here w/ calcitriol and sensipar ordered, not due for ESA. Labs pending. Can likely be discharged from ER post-HD after ER MD reassessment. Discussed with Dr. Jeraldine Loots. Please call/page with any questions/concerns. Also, please let us know if she is to be admitted.  Anthony Sar, MD [facility]

## 2023-06-20 NOTE — Discharge Instructions (Signed)
Make sure you continue getting your dialysis as scheduled

## 2023-06-20 NOTE — ED Provider Notes (Addendum)
[facility] EMERGENCY DEPARTMENT AT [facility] Provider Note   CSN: [id number] Arrival date & time: 06/20/23  1308     History  Chief Complaint  Patient presents with   Hypertension    [name] is a 58 y.o. female.  HPI Patient presents via medical transport with concern for headache, hypertension. Today is her dialysis day, Monday, Wednesday Friday.  Today she notes that she developed a headache, which is not unusual for her, checked her blood pressure and it was elevated, greater than 200 systolic.  No new focal weakness.  Patient has noted paraplegia, unchanged.  Given headache, hypertension she elected to come here for evaluation rather than go to dialysis.  She recently took her medication as directed.    Home Medications Prior to Admission medications   Medication Sig Start Date End Date Taking? Authorizing Provider  acetaminophen (TYLENOL) 500 MG tablet Take 1,000 mg by mouth every 6 (six) hours as needed for moderate pain.    [provider]  albuterol (PROVENTIL) (2.5 MG/3ML) 0.083% nebulizer solution Take 3 mLs (2.5 mg total) by nebulization every 4 (four) hours as needed for wheezing or shortness of breath. 04/15/23 04/14/24  Shon Hale, MD  amLODipine (NORVASC) 5 MG tablet Take 1 tablet (5 mg total) by mouth daily. Take 1 tablet on Monday,Wednesday and Friday then take 2 tablets on Tuesday,Thursday,Saturday and Sunday. 04/15/23   Shon Hale, MD  carvedilol (COREG) 12.5 MG tablet Take 1 tablet (12.5 mg total) by mouth 2 (two) times daily with a meal. 04/15/23 08/03/23  Shon Hale, MD  cloNIDine (CATAPRES) 0.1 MG tablet Take 1 tablet (0.1 mg total) by mouth 2 (two) times daily. 04/15/23   Shon Hale, MD  cyclobenzaprine (FLEXERIL) 5 MG tablet Take 5 mg by mouth 3 (three) times daily as needed for muscle spasms. 04/11/23   [provider]  furosemide (LASIX) 20 MG tablet Take 20 mg by mouth daily. 05/13/23    [provider]  furosemide (LASIX) 80 MG tablet Take 80 mg by mouth 4 (four) times a week. 06/13/23   [provider]  levETIRAcetam (KEPPRA) 500 MG tablet Take 2 tablets (1,000 mg total) by mouth every evening. Take an additional tablet on Monday,Wednesday and Friday evening after Dialysis 04/15/23   Shon Hale, MD  lidocaine-prilocaine (EMLA) cream Apply 1 Application topically 3 (three) times a week. 06/04/22   [provider]  pantoprazole (PROTONIX) 40 MG tablet Take 1 tablet (40 mg total) by mouth daily. 05/24/23   Sherryll Burger, Pratik D, DO  senna-docusate (SENOKOT-S) 8.6-50 MG tablet Take 2 tablets by mouth at bedtime. 11/12/22   Shon Hale, MD  sevelamer carbonate (RENVELA) 800 MG tablet Take 800 mg by mouth 3 (three) times daily. 12/11/22   [provider]      Allergies    Benadryl [diphenhydramine hcl (sleep)], Daptomycin, Linezolid, Moxifloxacin, Quinine derivatives, Vancomycin, Azithromycin, Tetracycline, and Zosyn [piperacillin sod-tazobactam so]    Review of Systems   Review of Systems  Physical Exam Updated Vital Signs BP (!) 178/102   Pulse 81   Temp 97.7 F (36.5 C) (Oral)   Resp (!) 27   LMP  (LMP Unknown)   SpO2 100%  Physical Exam Vitals and nursing note reviewed.  Constitutional:      General: She is not in acute distress.    Appearance: She is well-developed.  HENT:     Head: Normocephalic and atraumatic.  Eyes:     Conjunctiva/sclera: Conjunctivae normal.  Cardiovascular:     Rate and Rhythm: Normal rate and regular rhythm.  Pulmonary:     Effort: Pulmonary effort is normal. No respiratory distress.     Breath sounds: Normal breath sounds. No stridor.  Abdominal:     General: There is no distension.  Skin:    General: Skin is warm and dry.  Neurological:     Mental Status: She is alert and oriented to person, place, and time.     Cranial Nerves: No cranial nerve deficit.     Comments: Paraplegia upper  extremities unremarkable     ED Results / Procedures / Treatments   Labs (all labs ordered are listed, but only abnormal results are displayed) Labs Reviewed  BASIC METABOLIC PANEL - Abnormal; Notable for the following components:      Result Value   Potassium 5.3 (*)    Glucose, Bld 102 (*)    BUN 59 (*)    Creatinine, Ser 4.77 (*)    GFR, Estimated 10 (*)    All other components within normal limits  CBC WITH DIFFERENTIAL/PLATELET - Abnormal; Notable for the following components:   Hemoglobin 11.2 (*)    RDW 18.6 (*)    All other components within normal limits  HEPATITIS B SURFACE ANTIGEN  HEPATITIS B SURFACE ANTIBODY, QUANTITATIVE    EKG None  Radiology No results found.  Procedures Procedures    Medications Ordered in ED Medications  Chlorhexidine Gluconate Cloth 2 % PADS 6 each (has no administration in time range)  pentafluoroprop-tetrafluoroeth (GEBAUERS) aerosol 1 Application (has no administration in time range)  calcitRIOL (ROCALTROL) capsule 1.5 mcg (has no administration in time range)  cinacalcet (SENSIPAR) tablet 30 mg (has no administration in time range)    ED Course/ Medical Decision Making/ A&P                                 Medical Decision Making Patient with multiple medical issues including paraplegia, end-stage renal disease presents with headache, hypertension.  Patient's neuroexam is reassuring, but given concern for hypertension, headache, concern for hypertensive emergency patient had labs, monitoring.  Patient's blood pressure improved after initial evaluation, as did her remaining vital signs, cardiac 85 sinus normal Pulse ox 100% room air normal   Amount and/or Complexity of Data Reviewed Independent Historian:     Details: I messaged with the patient's nephrologist regarding her history. External Data Reviewed: notes. Labs: ordered. Decision-making details documented in ED Course.    Details: Evidence for end-stage renal disease  with mild hyperkalemia ECG/medicine tests: ordered and independent interpretation performed. Decision-making details documented in ED Course.  Risk Prescription drug management. Decision regarding hospitalization. Diagnosis or treatment significantly limited by social determinants of health.    Update: Patient in similar condition, initial labs notable for substantial elevation in potassium.  12:25 PM Patient going to dialysis from the emergency department.  With hyperkalemia, initial hypertension, and continued need for dialysis, this was facilitated with the assistance of our nephrology colleagues.     CRITICAL CARE Performed by: Gerhard Munch Total critical care time: 35 minutes Critical care time was exclusive of separately billable procedures and treating other patients. Critical care was necessary to treat or prevent imminent or life-threatening deterioration. Critical care was time spent personally by me on the following activities: development of treatment plan with patient and/or surrogate as well as nursing, discussions with consultants, evaluation of patient's response to treatment, examination  of patient, obtaining history from patient or surrogate, ordering and performing treatments and interventions, ordering and review of laboratory studies, ordering and review of radiographic studies, pulse oximetry and re-evaluation of patient's condition.    Final Clinical Impression(s) / ED Diagnoses Final diagnoses:  Hypertensive urgency  Hyperkalemia     Gerhard Munch, MD 06/20/23 1225    Gerhard Munch, MD 06/20/23 1226

## 2023-06-20 NOTE — ED Notes (Signed)
Pt has electric wheelchair at bedside

## 2023-06-20 NOTE — ED Notes (Signed)
EDP aware of new bp and d/c po meds.

## 2023-06-20 NOTE — ED Notes (Signed)
Pt gone to dialysis. Motorized chair still in ED waiting on pt. Pt took pocket book and cell phone with her.

## 2023-06-20 NOTE — ED Provider Notes (Signed)
Patient back from dialysis.  Patient in no distress.  She is alert and oriented.  No shortness of breath.  She will be discharged home   Bethann Berkshire, MD 06/20/23 [phone]

## 2023-06-20 NOTE — ED Triage Notes (Signed)
Pt c/o HTN and HA 8/10, pt is a paraplegic, states she was supposed to have dialysis today but came here instead. States dialysis schedule is MWF and did get full dialysis Tx wednesday. Denies C/P, denies dizziness, endorses "a little" shob.

## 2023-06-20 NOTE — ED Notes (Signed)
Pt returned from diaylsis

## 2023-06-21 LAB — HEPATITIS B SURFACE ANTIBODY, QUANTITATIVE: Hep B S AB Quant (Post): 333 m[IU]/mL

## 2023-06-26 ENCOUNTER — Telehealth: Payer: Self-pay

## 2023-06-26 NOTE — Progress Notes (Signed)
Transition Care Management Follow-up Telephone Call Date of discharge and from where: 06/20/2023 [facility] How have you been since you were released from the hospital? Patient stated she is feeling better. Any questions or concerns? No  Items Reviewed: Did the pt receive and understand the discharge instructions provided? Yes  Medications obtained and verified? Yes  Other? No  Any new allergies since your discharge? No  Dietary orders reviewed? Yes Do you have support at home? Yes   Follow up appointments reviewed:  PCP Hospital f/u appt confirmed? Yes  Scheduled to see Gilmore Laroche, FNP on 07/15/2023 @ [facility]. Specialist Hospital f/u appt confirmed? No  Scheduled to see  on  @ . Are transportation arrangements needed? No  If their condition worsens, is the pt aware to call PCP or go to the Emergency Dept.? Yes Was the patient provided with contact information for the PCP's office or ED? Yes Was to pt encouraged to call back with questions or concerns? Yes   Tomia Enlow [facility]  [facility] Guide Direct Dial: [phone]  Fax: [phone] Website: [email or web address]

## 2023-06-28 ENCOUNTER — Emergency Department (HOSPITAL_COMMUNITY): Payer: 59

## 2023-06-28 ENCOUNTER — Encounter (HOSPITAL_COMMUNITY): Payer: Self-pay | Admitting: *Deleted

## 2023-06-28 ENCOUNTER — Other Ambulatory Visit: Payer: Self-pay

## 2023-06-28 ENCOUNTER — Inpatient Hospital Stay (HOSPITAL_COMMUNITY)
Admission: EM | Admit: 2023-06-28 | Discharge: 2023-07-01 | DRG: 189 | Disposition: A | Discharge status: Home Health | Payer: 59 | Attending: Internal Medicine | Admitting: Internal Medicine

## 2023-06-28 DIAGNOSIS — R000 Tachycardia, unspecified: Secondary | ICD-10-CM | POA: Diagnosis present

## 2023-06-28 DIAGNOSIS — K219 Gastro-esophageal reflux disease without esophagitis: Secondary | ICD-10-CM | POA: Diagnosis present

## 2023-06-28 DIAGNOSIS — D631 Anemia in chronic kidney disease: Secondary | ICD-10-CM | POA: Diagnosis present

## 2023-06-28 DIAGNOSIS — E782 Mixed hyperlipidemia: Secondary | ICD-10-CM | POA: Diagnosis not present

## 2023-06-28 DIAGNOSIS — E8889 Other specified metabolic disorders: Secondary | ICD-10-CM | POA: Diagnosis present

## 2023-06-28 DIAGNOSIS — J9601 Acute respiratory failure with hypoxia: Principal | ICD-10-CM | POA: Diagnosis present

## 2023-06-28 DIAGNOSIS — Z888 Allergy status to other drugs, medicaments and biological substances status: Secondary | ICD-10-CM

## 2023-06-28 DIAGNOSIS — L89314 Pressure ulcer of right buttock, stage 4: Secondary | ICD-10-CM | POA: Diagnosis present

## 2023-06-28 DIAGNOSIS — J9811 Atelectasis: Secondary | ICD-10-CM | POA: Diagnosis present

## 2023-06-28 DIAGNOSIS — N319 Neuromuscular dysfunction of bladder, unspecified: Secondary | ICD-10-CM | POA: Diagnosis present

## 2023-06-28 DIAGNOSIS — Z79899 Other long term (current) drug therapy: Secondary | ICD-10-CM

## 2023-06-28 DIAGNOSIS — I161 Hypertensive emergency: Secondary | ICD-10-CM | POA: Diagnosis present

## 2023-06-28 DIAGNOSIS — G8220 Paraplegia, unspecified: Secondary | ICD-10-CM | POA: Diagnosis present

## 2023-06-28 DIAGNOSIS — N2581 Secondary hyperparathyroidism of renal origin: Secondary | ICD-10-CM | POA: Diagnosis present

## 2023-06-28 DIAGNOSIS — N186 End stage renal disease: Secondary | ICD-10-CM | POA: Diagnosis present

## 2023-06-28 DIAGNOSIS — R262 Difficulty in walking, not elsewhere classified: Secondary | ICD-10-CM | POA: Diagnosis present

## 2023-06-28 DIAGNOSIS — I120 Hypertensive chronic kidney disease with stage 5 chronic kidney disease or end stage renal disease: Secondary | ICD-10-CM | POA: Diagnosis present

## 2023-06-28 DIAGNOSIS — B338 Other specified viral diseases: Secondary | ICD-10-CM | POA: Diagnosis not present

## 2023-06-28 DIAGNOSIS — L89324 Pressure ulcer of left buttock, stage 4: Secondary | ICD-10-CM | POA: Diagnosis present

## 2023-06-28 DIAGNOSIS — Z9359 Other cystostomy status: Secondary | ICD-10-CM | POA: Diagnosis not present

## 2023-06-28 DIAGNOSIS — G40909 Epilepsy, unspecified, not intractable, without status epilepticus: Secondary | ICD-10-CM

## 2023-06-28 DIAGNOSIS — R0602 Shortness of breath: Secondary | ICD-10-CM | POA: Diagnosis present

## 2023-06-28 DIAGNOSIS — Z8249 Family history of ischemic heart disease and other diseases of the circulatory system: Secondary | ICD-10-CM | POA: Diagnosis not present

## 2023-06-28 DIAGNOSIS — Z833 Family history of diabetes mellitus: Secondary | ICD-10-CM | POA: Diagnosis not present

## 2023-06-28 DIAGNOSIS — R6510 Systemic inflammatory response syndrome (SIRS) of non-infectious origin without acute organ dysfunction: Secondary | ICD-10-CM | POA: Diagnosis present

## 2023-06-28 DIAGNOSIS — Z992 Dependence on renal dialysis: Secondary | ICD-10-CM

## 2023-06-28 DIAGNOSIS — Z1152 Encounter for screening for COVID-19: Secondary | ICD-10-CM | POA: Diagnosis not present

## 2023-06-28 DIAGNOSIS — Z87440 Personal history of urinary (tract) infections: Secondary | ICD-10-CM

## 2023-06-28 DIAGNOSIS — B974 Respiratory syncytial virus as the cause of diseases classified elsewhere: Secondary | ICD-10-CM | POA: Diagnosis present

## 2023-06-28 DIAGNOSIS — Z881 Allergy status to other antibiotic agents status: Secondary | ICD-10-CM | POA: Diagnosis not present

## 2023-06-28 DIAGNOSIS — I10 Essential (primary) hypertension: Secondary | ICD-10-CM | POA: Diagnosis present

## 2023-06-28 LAB — COMPREHENSIVE METABOLIC PANEL
ALT: 13 U/L (ref 0–44)
AST: 19 U/L (ref 15–41)
Albumin: 3.8 g/dL (ref 3.5–5.0)
Alkaline Phosphatase: 113 U/L (ref 38–126)
Anion gap: 16 — ABNORMAL HIGH (ref 5–15)
BUN: 55 mg/dL — ABNORMAL HIGH (ref 6–20)
CO2: 27 mmol/L (ref 22–32)
Calcium: 9.8 mg/dL (ref 8.9–10.3)
Chloride: 93 mmol/L — ABNORMAL LOW (ref 98–111)
Creatinine, Ser: 4.77 mg/dL — ABNORMAL HIGH (ref 0.44–1.00)
GFR, Estimated: 10 mL/min — ABNORMAL LOW (ref >60)
Glucose, Bld: 118 mg/dL — ABNORMAL HIGH (ref 70–99)
Potassium: 4.5 mmol/L (ref 3.5–5.1)
Sodium: 136 mmol/L (ref 135–145)
Total Bilirubin: 0.7 mg/dL (ref 0.0–1.2)
Total Protein: 9.7 g/dL — ABNORMAL HIGH (ref 6.5–8.1)

## 2023-06-28 LAB — CBC WITH DIFFERENTIAL/PLATELET
Abs Immature Granulocytes: 0.02 10*3/uL (ref 0.00–0.07)
Basophils Absolute: 0.1 10*3/uL (ref 0.0–0.1)
Basophils Relative: 1 %
Eosinophils Absolute: 0.1 10*3/uL (ref 0.0–0.5)
Eosinophils Relative: 1 %
HCT: 46.1 % — ABNORMAL HIGH (ref 36.0–46.0)
Hemoglobin: 14 g/dL (ref 12.0–15.0)
Immature Granulocytes: 0 %
Lymphocytes Relative: 8 %
Lymphs Abs: 0.7 10*3/uL (ref 0.7–4.0)
MCH: 27.5 pg (ref 26.0–34.0)
MCHC: 30.4 g/dL (ref 30.0–36.0)
MCV: 90.4 fL (ref 80.0–100.0)
Monocytes Absolute: 0.6 10*3/uL (ref 0.1–1.0)
Monocytes Relative: 7 %
Neutro Abs: 7.5 10*3/uL (ref 1.7–7.7)
Neutrophils Relative %: 83 %
Platelets: 200 10*3/uL (ref 150–400)
RBC: 5.1 MIL/uL (ref 3.87–5.11)
RDW: 20 % — ABNORMAL HIGH (ref 11.5–15.5)
WBC: 9 10*3/uL (ref 4.0–10.5)
nRBC: 0 % (ref 0.0–0.2)

## 2023-06-28 LAB — RESP PANEL BY RT-PCR (RSV, FLU A&B, COVID)  RVPGX2
Influenza A by PCR: NEGATIVE
Influenza B by PCR: NEGATIVE
Resp Syncytial Virus by PCR: POSITIVE — AB
SARS Coronavirus 2 by RT PCR: NEGATIVE

## 2023-06-28 LAB — APTT: aPTT: 33 s (ref 24–36)

## 2023-06-28 LAB — PROTIME-INR
INR: 1.1 (ref 0.8–1.2)
Prothrombin Time: 14.6 s (ref 11.4–15.2)

## 2023-06-28 LAB — LACTIC ACID, PLASMA: Lactic Acid, Venous: 1.2 mmol/L (ref 0.5–1.9)

## 2023-06-28 MED ORDER — SODIUM CHLORIDE 0.9 % IV SOLN
500.0000 mg | Freq: Once | INTRAVENOUS | Status: AC
  Administered 2023-06-28: 500 mg via INTRAVENOUS
  Filled 2023-06-28: qty 5

## 2023-06-28 MED ORDER — HEPARIN SODIUM (PORCINE) 5000 UNIT/ML IJ SOLN
5000.0000 [IU] | Freq: Three times a day (TID) | INTRAMUSCULAR | Status: DC
  Administered 2023-06-28 – 2023-07-01 (×7): 5000 [IU] via SUBCUTANEOUS
  Filled 2023-06-28 (×7): qty 1

## 2023-06-28 MED ORDER — CEFTRIAXONE SODIUM 2 G IJ SOLR
2.0000 g | Freq: Once | INTRAMUSCULAR | Status: AC
  Administered 2023-06-28: 2 g via INTRAVENOUS
  Filled 2023-06-28: qty 20

## 2023-06-28 MED ORDER — NITROGLYCERIN IN D5W 200-5 MCG/ML-% IV SOLN
5.0000 ug/min | INTRAVENOUS | Status: DC
  Administered 2023-06-28: 5 ug/min via INTRAVENOUS
  Filled 2023-06-28: qty 250

## 2023-06-28 MED ORDER — ONDANSETRON HCL 4 MG PO TABS: 4.0000 mg | ORAL_TABLET | Freq: Four times a day (QID) | ORAL | Status: DC | PRN

## 2023-06-28 MED ORDER — ACETAMINOPHEN 650 MG RE SUPP: 650.0000 mg | Freq: Four times a day (QID) | RECTAL | Status: DC | PRN

## 2023-06-28 MED ORDER — IPRATROPIUM BROMIDE 0.02 % IN SOLN
RESPIRATORY_TRACT | Status: AC
  Administered 2023-06-28: 1 mg
  Filled 2023-06-28: qty 5

## 2023-06-28 MED ORDER — ALBUTEROL SULFATE (2.5 MG/3ML) 0.083% IN NEBU
10.0000 mg | INHALATION_SOLUTION | RESPIRATORY_TRACT | Status: AC
Start: 2023-06-28 — End: 2023-06-28
  Administered 2023-06-28: 10 mg via RESPIRATORY_TRACT
  Filled 2023-06-28: qty 12

## 2023-06-28 MED ORDER — METHYLPREDNISOLONE SODIUM SUCC 125 MG IJ SOLR
125.0000 mg | Freq: Once | INTRAMUSCULAR | Status: AC
  Administered 2023-06-28: 125 mg via INTRAVENOUS
  Filled 2023-06-28: qty 2

## 2023-06-28 MED ORDER — LACTATED RINGERS IV SOLN: INTRAVENOUS | Status: AC

## 2023-06-28 MED ORDER — ONDANSETRON HCL 4 MG/2ML IJ SOLN: 4.0000 mg | Freq: Four times a day (QID) | INTRAMUSCULAR | Status: DC | PRN

## 2023-06-28 MED ORDER — ACETAMINOPHEN 325 MG PO TABS
650.0000 mg | ORAL_TABLET | Freq: Four times a day (QID) | ORAL | Status: DC | PRN
  Administered 2023-06-29 – 2023-06-30 (×2): 650 mg via ORAL
  Filled 2023-06-28 (×3): qty 2

## 2023-06-28 NOTE — ED Provider Notes (Signed)
[place] EMERGENCY DEPARTMENT AT [facility] Provider Note   CSN: [id number] Arrival date & time: 06/28/23  1512     History  Chief Complaint  Patient presents with   Shortness of Breath    [name] is a 58 y.o. female.   Shortness of Breath  This patient is a 58 year old female, she has a history of end-stage renal disease on dialysis, left upper extremity access, she has a history of recurrent hypoxic respiratory failure and hypertensive urgency and emergency.  She has been seen in the emergency department several times per month over the last few months because of multiple different comorbidity problems all associated with the above.  She presents today febrile short of breath with left-sided chest pain and a feeling of weakness.    Home Medications Prior to Admission medications   Medication Sig Start Date End Date Taking? Authorizing Provider  acetaminophen (TYLENOL) 500 MG tablet Take 1,000 mg by mouth every 6 (six) hours as needed for moderate pain.    [provider]  albuterol (PROVENTIL) (2.5 MG/3ML) 0.083% nebulizer solution Take 3 mLs (2.5 mg total) by nebulization every 4 (four) hours as needed for wheezing or shortness of breath. 04/15/23 04/14/24  Shon Hale, MD  amLODipine (NORVASC) 5 MG tablet Take 1 tablet (5 mg total) by mouth daily. Take 1 tablet on Monday,Wednesday and Friday then take 2 tablets on Tuesday,Thursday,Saturday and Sunday. 04/15/23   Shon Hale, MD  carvedilol (COREG) 12.5 MG tablet Take 1 tablet (12.5 mg total) by mouth 2 (two) times daily with a meal. 04/15/23 08/03/23  Shon Hale, MD  cloNIDine (CATAPRES) 0.1 MG tablet Take 1 tablet (0.1 mg total) by mouth 2 (two) times daily. 04/15/23   Shon Hale, MD  cyclobenzaprine (FLEXERIL) 5 MG tablet Take 5 mg by mouth 3 (three) times daily as needed for muscle spasms. 04/11/23   [provider]  furosemide (LASIX) 20 MG tablet Take 20 mg by  mouth daily. 05/13/23   [provider]  furosemide (LASIX) 80 MG tablet Take 80 mg by mouth 4 (four) times a week. 06/13/23   [provider]  levETIRAcetam (KEPPRA) 500 MG tablet Take 2 tablets (1,000 mg total) by mouth every evening. Take an additional tablet on Monday,Wednesday and Friday evening after Dialysis 04/15/23   Shon Hale, MD  lidocaine-prilocaine (EMLA) cream Apply 1 Application topically 3 (three) times a week. 06/04/22   [provider]  pantoprazole (PROTONIX) 40 MG tablet Take 1 tablet (40 mg total) by mouth daily. 05/24/23   Sherryll Burger, Pratik D, DO  senna-docusate (SENOKOT-S) 8.6-50 MG tablet Take 2 tablets by mouth at bedtime. 11/12/22   Shon Hale, MD  sevelamer carbonate (RENVELA) 800 MG tablet Take 800 mg by mouth 3 (three) times daily. 12/11/22   [provider]      Allergies    Benadryl [diphenhydramine hcl (sleep)], Daptomycin, Linezolid, Moxifloxacin, Quinine derivatives, Vancomycin, Azithromycin, Tetracycline, and Zosyn [piperacillin sod-tazobactam so]    Review of Systems   Review of Systems  Respiratory:  Positive for shortness of breath.   All other systems reviewed and are negative.   Physical Exam Updated Vital Signs BP (!) 219/117   Pulse (!) 109   Temp 98.7 F (37.1 C) (Oral)   Resp (!) 30   Ht 1.575 m (5\' 2" )   Wt 45.4 kg   LMP  (LMP Unknown)   SpO2 96%   BMI 18.29 kg/m  Physical Exam Vitals and nursing note  reviewed.  Constitutional:      General: She is in acute distress.     Appearance: She is well-developed. She is ill-appearing.  HENT:     Head: Normocephalic and atraumatic.     Mouth/Throat:     Pharynx: No oropharyngeal exudate.  Eyes:     General: No scleral icterus.       Right eye: No discharge.        Left eye: No discharge.     Conjunctiva/sclera: Conjunctivae normal.     Pupils: Pupils are equal, round, and reactive to light.  Neck:     Thyroid: No thyromegaly.     Vascular: No  JVD.  Cardiovascular:     Rate and Rhythm: Regular rhythm. Tachycardia present.     Heart sounds: Normal heart sounds. No murmur heard.    No friction rub. No gallop.     Comments: Tachycardic to 115 and sinus tachycardia, she has a good thrill in the left upper extremity fistula Pulmonary:     Effort: Respiratory distress present.     Breath sounds: Wheezing and rales present.     Comments: Increased work of breathing, tachypneic, shallow respirations, rales at the left base Abdominal:     General: Bowel sounds are normal. There is no distension.     Palpations: Abdomen is soft. There is no mass.     Tenderness: There is no abdominal tenderness.  Musculoskeletal:        General: No tenderness. Normal range of motion.     Cervical back: Normal range of motion and neck supple.     Right lower leg: No edema.     Left lower leg: No edema.  Lymphadenopathy:     Cervical: No cervical adenopathy.  Skin:    General: Skin is warm and dry.     Findings: No erythema or rash.  Neurological:     Mental Status: She is alert.     Coordination: Coordination normal.  Psychiatric:        Behavior: Behavior normal.     ED Results / Procedures / Treatments   Labs (all labs ordered are listed, but only abnormal results are displayed) Labs Reviewed  RESP PANEL BY RT-PCR (RSV, FLU A&B, COVID)  RVPGX2 - Abnormal; Notable for the following components:      Result Value   Resp Syncytial Virus by PCR POSITIVE (*)    All other components within normal limits  COMPREHENSIVE METABOLIC PANEL - Abnormal; Notable for the following components:   Chloride 93 (*)    Glucose, Bld 118 (*)    BUN 55 (*)    Creatinine, Ser 4.77 (*)    Total Protein 9.7 (*)    GFR, Estimated 10 (*)    Anion gap 16 (*)    All other components within normal limits  CBC WITH DIFFERENTIAL/PLATELET - Abnormal; Notable for the following components:   HCT 46.1 (*)    RDW 20.0 (*)    All other components within normal limits   CULTURE, BLOOD (ROUTINE X 2)  CULTURE, BLOOD (ROUTINE X 2)  LACTIC ACID, PLASMA  PROTIME-INR  APTT    EKG None  Radiology DG Chest Port 1 View Result Date: 06/28/2023 CLINICAL DATA:  Question of sepsis to evaluate for abnormality. Shortness of breath and headache starting today. EXAM: PORTABLE CHEST 1 VIEW COMPARISON:  06/10/2023 FINDINGS: Postoperative fixation of the thoracic spine. Cardiac enlargement. Mild central vascular congestion. Suggestion of small right pleural effusion with basilar atelectasis. This  appears improved since prior study. No pneumothorax. Mediastinal contours appear intact. Calcification of the aorta. Old displaced fracture deformity of the right clavicle. IMPRESSION: 1. Cardiac enlargement. 2. Small right pleural effusion with basilar atelectasis or infiltration, improved since prior study. Electronically Signed   By: Burman Nieves M.D.   On: 06/28/2023 20:48    Procedures .Critical Care  Performed by: Eber Hong, MD Authorized by: Eber Hong, MD   Critical care provider statement:    Critical care time (minutes):  30   Critical care time was exclusive of:  Separately billable procedures and treating other patients and teaching time   Critical care was necessary to treat or prevent imminent or life-threatening deterioration of the following conditions:  Respiratory failure   Critical care was time spent personally by me on the following activities:  Development of treatment plan with patient or surrogate, discussions with consultants, evaluation of patient's response to treatment, examination of patient, ordering and review of laboratory studies, ordering and review of radiographic studies, ordering and performing treatments and interventions, pulse oximetry, re-evaluation of patient's condition and review of old charts   I assumed direction of critical care for this patient from another provider in my specialty: no     Care discussed with: admitting  provider   Comments:           Medications Ordered in ED Medications  lactated ringers infusion ( Intravenous New Bag/Given 06/28/23 2105)  azithromycin (ZITHROMAX) 500 mg in sodium chloride 0.9 % 250 mL IVPB (500 mg Intravenous New Bag/Given 06/28/23 2201)  albuterol (PROVENTIL) (2.5 MG/3ML) 0.083% nebulizer solution 10 mg (has no administration in time range)  nitroGLYCERIN 50 mg in dextrose 5 % 250 mL (0.2 mg/mL) infusion (25 mcg/min Intravenous Rate/Dose Change 06/28/23 2215)  cefTRIAXone (ROCEPHIN) 2 g in sodium chloride 0.9 % 100 mL IVPB (0 g Intravenous Stopped 06/28/23 2150)  methylPREDNISolone sodium succinate (SOLU-MEDROL) 125 mg/2 mL injection 125 mg (125 mg Intravenous Given 06/28/23 2203)    ED Course/ Medical Decision Making/ A&P                                 Medical Decision Making Amount and/or Complexity of Data Reviewed Labs: ordered. Radiology: ordered.  Risk Prescription drug management. Decision regarding hospitalization.    This patient presents to the ED for concern of shortness of breath severe hypertension and hypoxia, this involves an extensive number of treatment options, and is a complaint that carries with it a high risk of complications and morbidity.  The differential diagnosis includes pneumonia, pneumothorax, less likely to be pulmonary embolism, could be shortness of breath related to CHF and cardiac dysfunction given the severe hypertension   Co morbidities that complicate the patient evaluation  Your uncontrolled hypertension and end-stage renal disease on dialysis   Additional history obtained:  Additional history obtained from electronic medical record External records from outside source obtained and reviewed including prior admissions to the hospital, prior imaging   Lab Tests:  I Ordered, and personally interpreted labs.  The pertinent results include: Sepsis workup, it shows RSV positive   Imaging Studies ordered:  I ordered  imaging studies including chest x-ray I independently visualized and interpreted imaging which showed cardiomegaly I agree with the radiologist interpretation   Cardiac Monitoring: / EKG:  The patient was maintained on a cardiac monitor.  I personally viewed and interpreted the cardiac monitored which showed an underlying rhythm of:  Sinus tachycardia   Consultations Obtained:  I requested consultation with the hospitalist Dr. Thomes Dinning,  and discussed lab and imaging findings as well as pertinent plan - they recommend: Admission to the hospital   Problem List / ED Course / Critical interventions / Medication management  Hypertensive emergency with blood pressures of 220/120 now on a nitroglycerin drip, acute respiratory failure initially thought to be related to sepsis but now does not appear to be a bacterial source more likely RSV requiring oxygen I ordered medication including antibiotics for respiratory failure Reevaluation of the patient after these medicines showed that the patient critically ill but improving on nitro drip I have reviewed the patients home medicines and have made adjustments as needed   Social Determinants of Health:  Renal failure   Test / Admission - Considered:  Admit to higher level of care         Final Clinical Impression(s) / ED Diagnoses Final diagnoses:  Acute respiratory failure with hypoxia [facility])  Hypertensive emergency    Rx / DC Orders ED Discharge Orders     None         Eber Hong, MD 06/28/23 2218

## 2023-06-28 NOTE — Progress Notes (Signed)
CODE SEPSIS - PHARMACY COMMUNICATION  **Broad Spectrum Antibiotics should be administered within 1 hour of Sepsis diagnosis**  Time Code Sepsis Called/Page Received: 2011  Antibiotics Ordered: Azithromycin, ceftriaxone  Time of 1st antibiotic administration: 2055  Additional action taken by pharmacy: Messaged RN  If necessary, Name of Provider/Nurse Contacted: Leamon Arnt, RN    Merryl Hacker ,PharmD Clinical Pharmacist  06/28/2023  8:12 PM

## 2023-06-28 NOTE — H&P (Incomplete)
History and Physical    Patient: [name] DOB: January 28, 1966 DOA: 06/28/2023 DOS: the patient was seen and examined on 06/29/2023 PCP: Patient, No Pcp Per  Patient coming from: Home  Chief Complaint:  Chief Complaint  Patient presents with   Shortness of Breath   HPI: [name] is a 58 y.o. female with medical history significant of hypertension, ESRD on HD (MWF) paraplegia with no muscular deficits) suprapubic cath, hypertension, GERD, seizure bilateral sacral and decubitus ulcer who presents to the emergency department due to worsening shortness of breath.  She complained of generalized weakness and fever at home. She was recently admitted from 1/14 through 1/15 due to volume overload/pulmonary edema and hyperkalemia.  ED Course:  In the emergency department, BP was 198/113, other vital signs were within normal range except O2 sats and has subsequently dropped to 85% on room air, supplemental oxygen via [place] at 2 LPM was provided with improvement in O2 sat to 96-97%.  Workup in the ED showed normal CBC except hematocrit of 46.1.  BMP showed sodium 136, potassium 4.5, chloride 93, bicarb 27, glucose 118, BUN 55, creatinine 4.77, GFR 10 anion gap 16.  Lactic acid 1.2.  RSV was positive, influenza A, B and SARS-CoV-2 was negative. Chest x-ray showed cardiac enlargement.  Small right pleural effusion with basilar atelectasis or infiltration, improved since prior study. She was initially treated with ceftriaxone and azithromycin, IV Solu-Medrol 125 mg x 1 was given.  Hospitalist was asked to admit patient for further evaluation and management.   Review of Systems: Review of systems as noted in the HPI. All other systems reviewed and are negative.   Past Medical History:  Diagnosis Date   Abnormal uterine bleeding (AUB) 06/15/2014   Cancer (HCC)    uterine   High blood pressure    Paraplegia (lower)    Renal disorder    Seizure disorder (HCC)    Seizures  (HCC)    Suprapubic catheter (HCC)    Urinary tract infection    Past Surgical History:  Procedure Laterality Date   APPLICATION OF WOUND VAC Right 09/13/2021   (approximately 1-69mos ago) pressure sore on right hip   BACK SURGERY     Pt stated "before 2000"   BIOPSY  12/03/2022   Procedure: BIOPSY;  Surgeon: Dolores Frame, MD;  Location: [facility];  Service: Gastroenterology;;   ESOPHAGOGASTRODUODENOSCOPY N/A 09/20/2015   Procedure: ESOPHAGOGASTRODUODENOSCOPY (EGD);  Surgeon: Malissa Hippo, MD;  Location: [facility];  Service: Endoscopy;  Laterality: N/A;  730   ESOPHAGOGASTRODUODENOSCOPY (EGD) WITH PROPOFOL N/A 12/03/2022   Procedure: ESOPHAGOGASTRODUODENOSCOPY (EGD) WITH PROPOFOL;  Surgeon: Dolores Frame, MD;  Location: [facility];  Service: Gastroenterology;  Laterality: N/A;  1:15 pm, asa 3, pt knows to arrive at 10:30  dialysis pt, M,W & F   IR CATHETER TUBE CHANGE  04/02/2018   PERCUTANEOUS ENDOSCOPIC GASTROSTOMY (PEG) REMOVAL N/A 09/20/2015   Procedure: PERCUTANEOUS ENDOSCOPIC GASTROSTOMY (PEG) REMOVAL;  Surgeon: Malissa Hippo, MD;  Location: [facility];  Service: Endoscopy;  Laterality: N/A;   TEE WITHOUT CARDIOVERSION N/A 11/11/2022   Procedure: TRANSESOPHAGEAL ECHOCARDIOGRAM (TEE);  Surgeon: Pricilla Riffle, MD;  Location: [facility];  Service: Cardiovascular;  Laterality: N/A;    Social History:  reports that she has never smoked. She has never used smokeless tobacco. She reports current alcohol use. She reports that she does not use drugs.   Allergies  Allergen Reactions   Benadryl [Diphenhydramine Hcl (  Sleep)] Hives   Daptomycin Hives   Linezolid Other (See Comments)    Patient self-discontinued treatment due to GI intolerance. Taking it along with moxifloxacin   Moxifloxacin Other (See Comments)    Patient self-discontinued treatment due to GI intolerance. Taking it along with linezolid   Quinine Derivatives Other (See Comments)     Alters mental status   Vancomycin Other (See Comments)    Pt is tolerating this medication at HD   Azithromycin Itching and Rash   Tetracycline Itching    Able to tolerate Doxycycline.    Zosyn [Piperacillin Sod-Tazobactam So] Rash    Family History  Problem Relation Age of Onset   Cancer Mother    Hypertension Mother    Cancer Sister        breast and then spread everywhere.   Diabetes Paternal Grandmother    Hypertension Paternal Grandmother      Prior to Admission medications   Medication Sig Start Date End Date Taking? Authorizing Provider  acetaminophen (TYLENOL) 500 MG tablet Take 1,000 mg by mouth every 6 (six) hours as needed for moderate pain.    [provider]  albuterol (PROVENTIL) (2.5 MG/3ML) 0.083% nebulizer solution Take 3 mLs (2.5 mg total) by nebulization every 4 (four) hours as needed for wheezing or shortness of breath. 04/15/23 04/14/24  Shon Hale, MD  amLODipine (NORVASC) 5 MG tablet Take 1 tablet (5 mg total) by mouth daily. Take 1 tablet on Monday,Wednesday and Friday then take 2 tablets on Tuesday,Thursday,Saturday and Sunday. 04/15/23   Shon Hale, MD  carvedilol (COREG) 12.5 MG tablet Take 1 tablet (12.5 mg total) by mouth 2 (two) times daily with a meal. 04/15/23 08/03/23  Shon Hale, MD  cloNIDine (CATAPRES) 0.1 MG tablet Take 1 tablet (0.1 mg total) by mouth 2 (two) times daily. 04/15/23   Shon Hale, MD  cyclobenzaprine (FLEXERIL) 5 MG tablet Take 5 mg by mouth 3 (three) times daily as needed for muscle spasms. 04/11/23   [provider]  furosemide (LASIX) 20 MG tablet Take 20 mg by mouth daily. 05/13/23   [provider]  furosemide (LASIX) 80 MG tablet Take 80 mg by mouth 4 (four) times a week. 06/13/23   [provider]  levETIRAcetam (KEPPRA) 500 MG tablet Take 2 tablets (1,000 mg total) by mouth every evening. Take an additional tablet on Monday,Wednesday and Friday evening after Dialysis  04/15/23   Shon Hale, MD  lidocaine-prilocaine (EMLA) cream Apply 1 Application topically 3 (three) times a week. 06/04/22   [provider]  pantoprazole (PROTONIX) 40 MG tablet Take 1 tablet (40 mg total) by mouth daily. 05/24/23   Sherryll Burger, Pratik D, DO  senna-docusate (SENOKOT-S) 8.6-50 MG tablet Take 2 tablets by mouth at bedtime. 11/12/22   Shon Hale, MD  sevelamer carbonate (RENVELA) 800 MG tablet Take 800 mg by mouth 3 (three) times daily. 12/11/22   [provider]    Physical Exam: BP (!) 165/82   Pulse 91   Temp 99.1 F (37.3 C) (Oral)   Resp (!) 24   Ht 5\' 2"  (1.575 m)   Wt 45.4 kg   LMP  (LMP Unknown)   SpO2 97%   BMI 18.29 kg/m   General: 58 y.o. year-old female well developed ill-appearing, but in no acute distress.  Alert and oriented x3. HEENT: NCAT, EOMI Neck: Supple, trachea medial Cardiovascular: Tachycardia.  Regular rate and rhythm with no rubs or gallops.  No thyromegaly or JVD noted.  No lower  extremity edema. 2/4 pulses in all 4 extremities. Respiratory: Coarse breath sounds with diffuse rhonchi on auscultation.  Abdomen: Soft, nontender nondistended with normal bowel sounds x4 quadrants. Muskuloskeletal: No cyanosis, clubbing or edema noted bilaterally Neuro: CN II-XII intact, strength 5/5 x 4, sensation, reflexes intact Skin: No ulcerative lesions noted or rashes Psychiatry: Judgement and insight appear normal. Mood is appropriate for condition and setting          Labs on Admission:  Basic Metabolic Panel: Recent Labs  Lab 06/28/23 2011  NA 136  K 4.5  CL 93*  CO2 27  GLUCOSE 118*  BUN 55*  CREATININE 4.77*  CALCIUM 9.8   Liver Function Tests: Recent Labs  Lab 06/28/23 2011  AST 19  ALT 13  ALKPHOS 113  BILITOT 0.7  PROT 9.7*  ALBUMIN 3.8   No results for input(s): "LIPASE", "AMYLASE" in the last 168 hours. No results for input(s): "AMMONIA" in the last 168 hours. CBC: Recent Labs  Lab 06/28/23 2011   WBC 9.0  NEUTROABS 7.5  HGB 14.0  HCT 46.1*  MCV 90.4  PLT 200   Cardiac Enzymes: No results for input(s): "CKTOTAL", "CKMB", "CKMBINDEX", "TROPONINI" in the last 168 hours.  BNP (last 3 results) Recent Labs    03/05/23 1622 05/20/23 1645 06/10/23 0941  BNP 2,139.0* >4,500.0* >4,500.0*    ProBNP (last 3 results) No results for input(s): "PROBNP" in the last 8760 hours.  CBG: No results for input(s): "GLUCAP" in the last 168 hours.  Radiological Exams on Admission: DG Chest Port 1 View Result Date: 06/28/2023 CLINICAL DATA:  Question of sepsis to evaluate for abnormality. Shortness of breath and headache starting today. EXAM: PORTABLE CHEST 1 VIEW COMPARISON:  06/10/2023 FINDINGS: Postoperative fixation of the thoracic spine. Cardiac enlargement. Mild central vascular congestion. Suggestion of small right pleural effusion with basilar atelectasis. This appears improved since prior study. No pneumothorax. Mediastinal contours appear intact. Calcification of the aorta. Old displaced fracture deformity of the right clavicle. IMPRESSION: 1. Cardiac enlargement. 2. Small right pleural effusion with basilar atelectasis or infiltration, improved since prior study. Electronically Signed   By: Burman Nieves M.D.   On: 06/28/2023 20:48    EKG: I independently viewed the EKG done and my findings are as followed: Normal sinus rhythm at rate of 100 bpm.  Incomplete RBBB, LAFB  Assessment/Plan Present on Admission:  RSV (respiratory syncytial virus infection)  Acute respiratory failure with hypoxia (HCC)  Hypertensive emergency  Essential hypertension  GERD (gastroesophageal reflux disease)  Paraplegia (HCC)  Principal Problem:   RSV (respiratory syncytial virus infection) Active Problems:   ESRD on hemodialysis (HCC)   Seizure disorder (HCC)   Acute respiratory failure with hypoxia (HCC)   Paraplegia (HCC)   Hypertensive emergency   Essential hypertension   GERD  (gastroesophageal reflux disease)  Acute respiratory failure with hypoxia in the setting of RSV POA Continue Tylenol as needed pain Continue DuoNebs as needed Continue IV Solu-Medrol 40 mg twice daily Continue Protonix to prevent steroid-induced ulcer Continue incentive spirometry, flutter valve Continue supportive measures Continue supplemental oxygen to maintain O2 sat greater than 92%  SIRS due to above Patient met SIRS criteria due to tachycardia, tachypnea Continue treatment as described above  Hypertensive emergency Essential hypertension Continue IV nitroglycerin drip with plan to transition to home oral meds when BP is better controlled  ESRD on HD (MWF) Last dialysis session was yesterday (1/31) Continue [facility] be consulted for maintenance dialysis  Seizure disorder Continue Keppra.  GERD Continue Protonix   Paraplegia/ambulatory dysfunction and neurogenic bladder Chronic indwelling catheter in place No signs of acute infection or complaints Continue supportive care    DVT prophylaxis: Heparin subcu  Code Status: Full code  Family Communication: None at bedside  Consults: Nephrology  Severity of Illness: The appropriate patient status for this patient is INPATIENT. Inpatient status is judged to be reasonable and necessary in order to provide the required intensity of service to ensure the patient's safety. The patient's presenting symptoms, physical exam findings, and initial radiographic and laboratory data in the context of their chronic comorbidities is felt to place them at high risk for further clinical deterioration. Furthermore, it is not anticipated that the patient will be medically stable for discharge from the hospital within 2 midnights of admission.   * I certify that at the point of admission it is my clinical judgment that the patient will require inpatient hospital care spanning beyond 2 midnights from the point of admission due to  high intensity of service, high risk for further deterioration and high frequency of surveillance required.*  Author: Frankey Shown, DO 06/29/2023 2:08 AM  For on call review [email or web address].

## 2023-06-28 NOTE — Progress Notes (Signed)
 Pt being followed by [facility] for Sepsis protocol.

## 2023-06-28 NOTE — ED Triage Notes (Signed)
Pt with SOB and HA starting today. Pt seen for same on 06/20/23

## 2023-06-29 DIAGNOSIS — B338 Other specified viral diseases: Secondary | ICD-10-CM | POA: Diagnosis not present

## 2023-06-29 DIAGNOSIS — J9601 Acute respiratory failure with hypoxia: Secondary | ICD-10-CM | POA: Diagnosis not present

## 2023-06-29 DIAGNOSIS — I161 Hypertensive emergency: Secondary | ICD-10-CM | POA: Diagnosis not present

## 2023-06-29 DIAGNOSIS — G8220 Paraplegia, unspecified: Secondary | ICD-10-CM

## 2023-06-29 DIAGNOSIS — K219 Gastro-esophageal reflux disease without esophagitis: Secondary | ICD-10-CM | POA: Diagnosis not present

## 2023-06-29 LAB — CBC
HCT: 36.3 % (ref 36.0–46.0)
Hemoglobin: 11.1 g/dL — ABNORMAL LOW (ref 12.0–15.0)
MCH: 27.4 pg (ref 26.0–34.0)
MCHC: 30.6 g/dL (ref 30.0–36.0)
MCV: 89.6 fL (ref 80.0–100.0)
Platelets: 154 10*3/uL (ref 150–400)
RBC: 4.05 MIL/uL (ref 3.87–5.11)
RDW: 19.5 % — ABNORMAL HIGH (ref 11.5–15.5)
WBC: 9 10*3/uL (ref 4.0–10.5)
nRBC: 0 % (ref 0.0–0.2)

## 2023-06-29 LAB — MRSA NEXT GEN BY PCR, NASAL: MRSA by PCR Next Gen: NOT DETECTED

## 2023-06-29 LAB — COMPREHENSIVE METABOLIC PANEL
ALT: 15 U/L (ref 0–44)
AST: 21 U/L (ref 15–41)
Albumin: 3 g/dL — ABNORMAL LOW (ref 3.5–5.0)
Alkaline Phosphatase: 91 U/L (ref 38–126)
Anion gap: 17 — ABNORMAL HIGH (ref 5–15)
BUN: 56 mg/dL — ABNORMAL HIGH (ref 6–20)
CO2: 22 mmol/L (ref 22–32)
Calcium: 9.1 mg/dL (ref 8.9–10.3)
Chloride: 95 mmol/L — ABNORMAL LOW (ref 98–111)
Creatinine, Ser: 4.95 mg/dL — ABNORMAL HIGH (ref 0.44–1.00)
GFR, Estimated: 10 mL/min — ABNORMAL LOW (ref >60)
Glucose, Bld: 130 mg/dL — ABNORMAL HIGH (ref 70–99)
Potassium: 4.5 mmol/L (ref 3.5–5.1)
Sodium: 134 mmol/L — ABNORMAL LOW (ref 135–145)
Total Bilirubin: 0.6 mg/dL (ref 0.0–1.2)
Total Protein: 7.5 g/dL (ref 6.5–8.1)

## 2023-06-29 LAB — PHOSPHORUS: Phosphorus: 5.6 mg/dL — ABNORMAL HIGH (ref 2.5–4.6)

## 2023-06-29 LAB — MAGNESIUM: Magnesium: 2 mg/dL (ref 1.7–2.4)

## 2023-06-29 MED ORDER — LABETALOL HCL 5 MG/ML IV SOLN
20.0000 mg | Freq: Four times a day (QID) | INTRAVENOUS | Status: DC | PRN
Start: 2023-06-29 — End: 2023-07-01
  Administered 2023-06-29: 20 mg via INTRAVENOUS
  Filled 2023-06-29: qty 4

## 2023-06-29 MED ORDER — SUMATRIPTAN SUCCINATE 50 MG PO TABS
50.0000 mg | ORAL_TABLET | Freq: Three times a day (TID) | ORAL | Status: DC | PRN
  Administered 2023-06-29: 50 mg via ORAL
  Filled 2023-06-29 (×2): qty 1

## 2023-06-29 MED ORDER — TRAZODONE HCL 50 MG PO TABS
25.0000 mg | ORAL_TABLET | Freq: Every evening | ORAL | Status: DC | PRN
  Administered 2023-06-29: 25 mg via ORAL
  Filled 2023-06-29: qty 1

## 2023-06-29 MED ORDER — CLONIDINE HCL 0.1 MG PO TABS
0.1000 mg | ORAL_TABLET | Freq: Three times a day (TID) | ORAL | Status: DC
  Administered 2023-06-29 – 2023-07-01 (×4): 0.1 mg via ORAL
  Filled 2023-06-29 (×4): qty 1

## 2023-06-29 MED ORDER — ORAL CARE MOUTH RINSE: 15.0000 mL | OROMUCOSAL | Status: DC | PRN

## 2023-06-29 MED ORDER — NEPRO/CARBSTEADY PO LIQD
237.0000 mL | Freq: Two times a day (BID) | ORAL | Status: DC
  Administered 2023-06-30 – 2023-07-01 (×3): 237 mL via ORAL

## 2023-06-29 MED ORDER — PREDNISONE 20 MG PO TABS
60.0000 mg | ORAL_TABLET | Freq: Every day | ORAL | Status: DC
  Administered 2023-06-30 – 2023-07-01 (×2): 60 mg via ORAL
  Filled 2023-06-29 (×2): qty 3

## 2023-06-29 MED ORDER — SEVELAMER CARBONATE 800 MG PO TABS
800.0000 mg | ORAL_TABLET | Freq: Three times a day (TID) | ORAL | Status: DC
  Administered 2023-06-29 – 2023-07-01 (×6): 800 mg via ORAL
  Filled 2023-06-29 (×6): qty 1

## 2023-06-29 MED ORDER — PANTOPRAZOLE SODIUM 40 MG PO TBEC: 40.0000 mg | DELAYED_RELEASE_TABLET | Freq: Every day | ORAL | Status: DC

## 2023-06-29 MED ORDER — IPRATROPIUM-ALBUTEROL 0.5-2.5 (3) MG/3ML IN SOLN
3.0000 mL | RESPIRATORY_TRACT | Status: DC | PRN
  Administered 2023-06-29: 3 mL via RESPIRATORY_TRACT
  Filled 2023-06-29: qty 3

## 2023-06-29 MED ORDER — METHYLPREDNISOLONE SODIUM SUCC 40 MG IJ SOLR
40.0000 mg | Freq: Two times a day (BID) | INTRAMUSCULAR | Status: DC
  Administered 2023-06-29: 40 mg via INTRAVENOUS
  Filled 2023-06-29: qty 1

## 2023-06-29 MED ORDER — CHLORHEXIDINE GLUCONATE CLOTH 2 % EX PADS
6.0000 | MEDICATED_PAD | Freq: Every day | CUTANEOUS | Status: DC
  Administered 2023-06-29: 6 via TOPICAL

## 2023-06-29 MED ORDER — PANTOPRAZOLE SODIUM 40 MG PO TBEC
40.0000 mg | DELAYED_RELEASE_TABLET | Freq: Two times a day (BID) | ORAL | Status: DC
  Administered 2023-06-29 – 2023-07-01 (×5): 40 mg via ORAL
  Filled 2023-06-29 (×5): qty 1

## 2023-06-29 MED ORDER — LEVETIRACETAM 500 MG PO TABS
1000.0000 mg | ORAL_TABLET | Freq: Every evening | ORAL | Status: DC
  Administered 2023-06-29 – 2023-06-30 (×2): 1000 mg via ORAL
  Filled 2023-06-29 (×2): qty 2

## 2023-06-29 MED ORDER — CARVEDILOL 12.5 MG PO TABS
12.5000 mg | ORAL_TABLET | Freq: Two times a day (BID) | ORAL | Status: DC
  Administered 2023-06-29 – 2023-07-01 (×5): 12.5 mg via ORAL
  Filled 2023-06-29 (×5): qty 1

## 2023-06-29 MED ORDER — CLONIDINE HCL 0.1 MG PO TABS
0.1000 mg | ORAL_TABLET | Freq: Two times a day (BID) | ORAL | Status: DC
  Administered 2023-06-29: 0.1 mg via ORAL
  Filled 2023-06-29: qty 1

## 2023-06-29 MED ORDER — HYDRALAZINE HCL 20 MG/ML IJ SOLN
10.0000 mg | Freq: Three times a day (TID) | INTRAMUSCULAR | Status: DC | PRN
  Administered 2023-06-29: 10 mg via INTRAVENOUS
  Filled 2023-06-29: qty 1

## 2023-06-29 MED ORDER — AMLODIPINE BESYLATE 5 MG PO TABS
5.0000 mg | ORAL_TABLET | Freq: Every day | ORAL | Status: DC
  Administered 2023-06-29 – 2023-07-01 (×3): 5 mg via ORAL
  Filled 2023-06-29 (×3): qty 1

## 2023-06-29 MED ORDER — SEVELAMER CARBONATE 800 MG PO TABS: 800.0000 mg | ORAL_TABLET | Freq: Three times a day (TID) | ORAL | Status: DC

## 2023-06-29 NOTE — ED Notes (Signed)
When flushing 2nd IV site, pt endorses pain and site leaking.

## 2023-06-29 NOTE — Progress Notes (Signed)
Dr.Madera notified of pt's elevated BP (See flowsheet)  MD aware of pt.currently have not IV access.

## 2023-06-29 NOTE — ED Notes (Signed)
 Pt given water per request

## 2023-06-29 NOTE — ED Notes (Signed)
RN called pharmacy for infiltration of nitroglycerin. Recommend to elevate arm and cold compress.

## 2023-06-29 NOTE — Progress Notes (Signed)
Progress Note   Patient: [name] ZOX:[id number] DOB: 11/04/1965 DOA: 06/28/2023     1 DOS: the patient was seen and examined on 06/29/2023   Brief hospital admission course: As per H&P written by Dr. Thomes Dinning on 06/28/2023 [name] is a 58 y.o. female with medical history significant of hypertension, ESRD on HD (MWF) paraplegia with no muscular deficits) suprapubic cath, hypertension, GERD, seizure bilateral sacral and decubitus ulcer who presents to the emergency department due to worsening shortness of breath.  She complained of generalized weakness and fever at home. She was recently admitted from 1/14 through 1/15 due to volume overload/pulmonary edema and hyperkalemia.   ED Course:  In the emergency department, BP was 198/113, other vital signs were within normal range except O2 sats and has subsequently dropped to 85% on room air, supplemental oxygen via [place] at 2 LPM was provided with improvement in O2 sat to 96-97%.  Workup in the ED showed normal CBC except hematocrit of 46.1.  BMP showed sodium 136, potassium 4.5, chloride 93, bicarb 27, glucose 118, BUN 55, creatinine 4.77, GFR 10 anion gap 16.  Lactic acid 1.2.  RSV was positive, influenza A, B and SARS-CoV-2 was negative. Chest x-ray showed cardiac enlargement.  Small right pleural effusion with basilar atelectasis or infiltration, improved since prior study. She was initially treated with ceftriaxone and azithromycin, IV Solu-Medrol 125 mg x 1 was given.  Hospitalist was asked to admit patient for further evaluation and management.  Assessment and Plan: 1-acute respiratory failure with hypoxia in the setting of RSV infection -Continue supportive care and oxygen supplementation -Continue bronchodilator management and the use of his steroids -Patient advised to use incentive spirometer and flutter valve -Continue mucolytic's/antitussive medication -Antipyretics and analgesics will be provided as needed for comfort and  symptomatic management. -Wean off oxygen supplementation and assess the saturation screening.  2-hypertensive emergency -Patient required the use of nitroglycerin drip to control blood pressure -Resuming home antihypertensive agents and wean off nitroglycerin drip. -Follow vital signs -Low-sodium diet discussed with patient.  3-SIRS present at time of admission -Experiencing tachycardia and tachypnea -Appears to be associated with lack of home medications (beta-blocker) and the presence of RSV infection -Continue supportive care and treatment as mentioned above -Resume home meds.  4-ESRD -Last dialysis session on 06/27/2023 -Nephrology service has been contacted to pursued hemodialysis while inpatient -Electrolytes stable for the most part.  5-GERD -Continue PPI.  6-history of seizure disorder -Continue Keppra.  7-paraplegia/ambulatory dysfunction and neurogenic bladder -No signs of acute infection or complaints from chronic condition -Continue supportive care and the use of muscle relaxants/analgesics as per home regimen.   Subjective:  Complaining of headaches; no chest pain, no nausea, no vomiting.  Patient is afebrile currently.  Physical Exam: Vitals:   06/29/23 1516 06/29/23 1530 06/29/23 1600 06/29/23 1646  BP:  (!) 182/69 (!) 164/62   Pulse: 96 92 91   Resp: (!) 27 (!) 25 20   Temp:    97.9 F (36.6 C)  TempSrc:    Oral  SpO2: 97% 97% 98%   Weight:      Height:       General exam: Alert, awake, oriented x 3; reports general malaise and feeling unwell. Respiratory system: 2 L nasal cannula supplementation in place.  Positive tachypnea intermittently appreciated; no using accessory muscle. Cardiovascular system: Rate controlled, no rubs, no gallops, no JVD. Gastrointestinal system: Abdomen is nondistended, soft and nontender. No organomegaly or masses felt.  Positive bowel sounds.  Central nervous system: No new focal neurological deficits. Extremities: No  cyanosis or clubbing. Skin: No petechiae. Psychiatry: Flat affect appreciated on exam.  Data Reviewed: MRSA PCR: Negative Phosphorus: 5.6 Magnesium: 2.0 CBC: WBCs 9.0, hemoglobin 11.1 and platelet count 154K Comprehensive metabolic panel: Sodium 134, potassium 4.5, chloride 95, bicarb 22, BUN 56, creatinine 4.95; normal LFTs and GFR 10.   Family Communication: Daughter updated over the phone at bedside.  Disposition: Status is: Inpatient Remains inpatient appropriate because: Continue blood pressure management; treatment for acute RSV infection and assess desaturation screening.   Planned Discharge Destination: Home  CRITICAL CARE Performed by: Vassie Loll   Total critical care time: 55 minutes  Critical care time was exclusive of separately billable procedures and treating other patients.  Critical care was necessary to treat or prevent imminent or life-threatening deterioration.  Critical care was time spent personally by me on the following activities: development of treatment plan with patient and/or surrogate as well as nursing, discussions with consultants, evaluation of patient's response to treatment, examination of patient, obtaining history from patient or surrogate, ordering and performing treatments and interventions, ordering and review of laboratory studies, ordering and review of radiographic studies, pulse oximetry and re-evaluation of patient's condition.   Author: Vassie Loll, MD 06/29/2023 5:15 PM  For on call review [email or web address].

## 2023-06-29 NOTE — ED Notes (Signed)
Upon IV assessment, pts IV infusing with nitroglycerin is swollen and cool to touch. Pt denies pain to site.

## 2023-06-30 DIAGNOSIS — G40909 Epilepsy, unspecified, not intractable, without status epilepticus: Secondary | ICD-10-CM | POA: Diagnosis not present

## 2023-06-30 DIAGNOSIS — I10 Essential (primary) hypertension: Secondary | ICD-10-CM | POA: Diagnosis not present

## 2023-06-30 DIAGNOSIS — B338 Other specified viral diseases: Secondary | ICD-10-CM | POA: Diagnosis not present

## 2023-06-30 DIAGNOSIS — G8220 Paraplegia, unspecified: Secondary | ICD-10-CM | POA: Diagnosis not present

## 2023-06-30 MED ORDER — CHLORHEXIDINE GLUCONATE CLOTH 2 % EX PADS
6.0000 | MEDICATED_PAD | Freq: Every day | CUTANEOUS | Status: DC
  Administered 2023-07-01: 6 via TOPICAL

## 2023-06-30 NOTE — Consult Note (Signed)
[facility] Renal Consultation Note    Indication for Consultation:  Management of ESRD/hemodialysis; anemia, hypertension/volume and secondary hyperparathyroidism  HPI: [name] is a 58 y.o. female   with a PMH significant for HTN, paraplegia, chronic suprapubic catheter, decubitus ulcers, and ESRD on HD MWF at [facility] who presented to [facility] ED on 06/28/23 complaining os SOB and headache.  In the ED, Temp 98.7, Bp 219/117, HR 109, RR 30, SpO2 96%.  Labs were notable for +RSV by PCR, influenza A, B, and SARS-CoV-2 negative.  CXR with cardiac enlargement, small right pleural effusion.  She was admitted and started on antibiotics and steroids.  We were consulted to provide dialysis during her hospitalization.  Past Medical History:  Diagnosis Date   Abnormal uterine bleeding (AUB) 06/15/2014   Cancer (HCC)    uterine   High blood pressure    Paraplegia (lower)    Renal disorder    Seizure disorder (HCC)    Seizures (HCC)    Suprapubic catheter (HCC)    Urinary tract infection    Past Surgical History:  Procedure Laterality Date   APPLICATION OF WOUND VAC Right 09/13/2021   (approximately 1-34mos ago) pressure sore on right hip   BACK SURGERY     Pt stated "before 2000"   BIOPSY  12/03/2022   Procedure: BIOPSY;  Surgeon: Dolores Frame, MD;  Location: [facility];  Service: Gastroenterology;;   ESOPHAGOGASTRODUODENOSCOPY N/A 09/20/2015   Procedure: ESOPHAGOGASTRODUODENOSCOPY (EGD);  Surgeon: Malissa Hippo, MD;  Location: [facility];  Service: Endoscopy;  Laterality: N/A;  730   ESOPHAGOGASTRODUODENOSCOPY (EGD) WITH PROPOFOL N/A 12/03/2022   Procedure: ESOPHAGOGASTRODUODENOSCOPY (EGD) WITH PROPOFOL;  Surgeon: Dolores Frame, MD;  Location: [facility];  Service: Gastroenterology;  Laterality: N/A;  1:15 pm, asa 3, pt knows to arrive at 10:30  dialysis pt, M,W & F   IR CATHETER TUBE CHANGE  04/02/2018   PERCUTANEOUS ENDOSCOPIC  GASTROSTOMY (PEG) REMOVAL N/A 09/20/2015   Procedure: PERCUTANEOUS ENDOSCOPIC GASTROSTOMY (PEG) REMOVAL;  Surgeon: Malissa Hippo, MD;  Location: [facility];  Service: Endoscopy;  Laterality: N/A;   TEE WITHOUT CARDIOVERSION N/A 11/11/2022   Procedure: TRANSESOPHAGEAL ECHOCARDIOGRAM (TEE);  Surgeon: Pricilla Riffle, MD;  Location: [facility] ORS;  Service: Cardiovascular;  Laterality: N/A;   Family History:   Family History  Problem Relation Age of Onset   Cancer Mother    Hypertension Mother    Cancer Sister        breast and then spread everywhere.   Diabetes Paternal Grandmother    Hypertension Paternal Grandmother    Social History:  reports that she has never smoked. She has never used smokeless tobacco. She reports current alcohol use. She reports that she does not use drugs. Allergies  Allergen Reactions   Benadryl [Diphenhydramine Hcl (Sleep)] Hives   Daptomycin Hives   Linezolid Other (See Comments)    Patient self-discontinued treatment due to GI intolerance. Taking it along with moxifloxacin   Moxifloxacin Other (See Comments)    Patient self-discontinued treatment due to GI intolerance. Taking it along with linezolid   Quinine Derivatives Other (See Comments)    Alters mental status   Vancomycin Other (See Comments)    Pt is tolerating this medication at HD   Azithromycin Itching and Rash   Tetracycline Itching    Able to tolerate Doxycycline.    Zosyn [Piperacillin Sod-Tazobactam So] Rash   Prior to Admission medications   Medication Sig Start Date  End Date Taking? Authorizing Provider  acetaminophen (TYLENOL) 500 MG tablet Take 1,000 mg by mouth every 6 (six) hours as needed for moderate pain.    [provider]  albuterol (PROVENTIL) (2.5 MG/3ML) 0.083% nebulizer solution Take 3 mLs (2.5 mg total) by nebulization every 4 (four) hours as needed for wheezing or shortness of breath. 04/15/23 04/14/24  Shon Hale, MD  amLODipine (NORVASC) 5 MG tablet Take 1  tablet (5 mg total) by mouth daily. Take 1 tablet on Monday,Wednesday and Friday then take 2 tablets on Tuesday,Thursday,Saturday and Sunday. 04/15/23   Shon Hale, MD  carvedilol (COREG) 12.5 MG tablet Take 1 tablet (12.5 mg total) by mouth 2 (two) times daily with a meal. 04/15/23 08/03/23  Shon Hale, MD  cloNIDine (CATAPRES) 0.1 MG tablet Take 1 tablet (0.1 mg total) by mouth 2 (two) times daily. 04/15/23   Shon Hale, MD  cyclobenzaprine (FLEXERIL) 5 MG tablet Take 5 mg by mouth 3 (three) times daily as needed for muscle spasms. 04/11/23   [provider]  furosemide (LASIX) 20 MG tablet Take 20 mg by mouth daily. 05/13/23   [provider]  furosemide (LASIX) 80 MG tablet Take 80 mg by mouth 4 (four) times a week. 06/13/23   [provider]  levETIRAcetam (KEPPRA) 500 MG tablet Take 2 tablets (1,000 mg total) by mouth every evening. Take an additional tablet on Monday,Wednesday and Friday evening after Dialysis 04/15/23   Shon Hale, MD  lidocaine-prilocaine (EMLA) cream Apply 1 Application topically 3 (three) times a week. 06/04/22   [provider]  pantoprazole (PROTONIX) 40 MG tablet Take 1 tablet (40 mg total) by mouth daily. 05/24/23   Sherryll Burger, Pratik D, DO  senna-docusate (SENOKOT-S) 8.6-50 MG tablet Take 2 tablets by mouth at bedtime. 11/12/22   Shon Hale, MD  sevelamer carbonate (RENVELA) 800 MG tablet Take 800 mg by mouth 3 (three) times daily. 12/11/22   [provider]   Current Facility-Administered Medications  Medication Dose Route Frequency Provider Last Rate Last Admin   acetaminophen (TYLENOL) tablet 650 mg  650 mg Oral Q6H PRN Adefeso, Oladapo, DO   650 mg at 06/29/23 1516   Or   acetaminophen (TYLENOL) suppository 650 mg  650 mg Rectal Q6H PRN Adefeso, Oladapo, DO       amLODipine (NORVASC) tablet 5 mg  5 mg Oral Daily Vassie Loll, MD   5 mg at 06/30/23 6045   carvedilol (COREG) tablet 12.5 mg  12.5 mg  Oral BID WC Vassie Loll, MD   12.5 mg at 06/30/23 4098   Chlorhexidine Gluconate Cloth 2 % PADS 6 each  6 each Topical Daily Vassie Loll, MD   6 each at 06/29/23 1191   Chlorhexidine Gluconate Cloth 2 % PADS 6 each  6 each Topical Q0600 Terrial Rhodes, MD       cloNIDine (CATAPRES) tablet 0.1 mg  0.1 mg Oral TID Vassie Loll, MD   0.1 mg at 06/30/23 0827   feeding supplement (NEPRO CARB STEADY) liquid 237 mL  237 mL Oral BID BM Vassie Loll, MD       heparin injection 5,000 Units  5,000 Units Subcutaneous Q8H Adefeso, Oladapo, DO   5,000 Units at 06/30/23 0522   hydrALAZINE (APRESOLINE) injection 10 mg  10 mg Intravenous Q8H PRN Vassie Loll, MD   10 mg at 06/29/23 1507   ipratropium-albuterol (DUONEB) 0.5-2.5 (3) MG/3ML nebulizer solution 3 mL  3 mL Nebulization Q4H PRN Adefeso, Oladapo, DO   3  mL at 06/29/23 2013   labetalol (NORMODYNE) injection 20 mg  20 mg Intravenous Q6H PRN Vassie Loll, MD   20 mg at 06/29/23 1916   levETIRAcetam (KEPPRA) tablet 1,000 mg  1,000 mg Oral QPM Adefeso, Oladapo, DO   1,000 mg at 06/29/23 1642   ondansetron (ZOFRAN) tablet 4 mg  4 mg Oral Q6H PRN Adefeso, Oladapo, DO       Or   ondansetron (ZOFRAN) injection 4 mg  4 mg Intravenous Q6H PRN Adefeso, Oladapo, DO       Oral care mouth rinse  15 mL Mouth Rinse PRN Vassie Loll, MD       pantoprazole (PROTONIX) EC tablet 40 mg  40 mg Oral BID Vassie Loll, MD   40 mg at 06/30/23 1610   predniSONE (DELTASONE) tablet 60 mg  60 mg Oral Q breakfast Vassie Loll, MD   60 mg at 06/30/23 0827   sevelamer carbonate (RENVELA) tablet 800 mg  800 mg Oral TID WC Adefeso, Oladapo, DO   800 mg at 06/30/23 9604   SUMAtriptan (IMITREX) tablet 50 mg  50 mg Oral Q8H PRN Vassie Loll, MD   50 mg at 06/29/23 1757   traZODone (DESYREL) tablet 25 mg  25 mg Oral QHS PRN Mansy, Jan A, MD   25 mg at 06/29/23 2330   Labs: Basic Metabolic Panel: Recent Labs  Lab 06/28/23 2011 06/29/23 0434  NA 136 134*  K 4.5  4.5  CL 93* 95*  CO2 27 22  GLUCOSE 118* 130*  BUN 55* 56*  CREATININE 4.77* 4.95*  CALCIUM 9.8 9.1  PHOS  --  5.6*   Liver Function Tests: Recent Labs  Lab 06/28/23 2011 06/29/23 0434  AST 19 21  ALT 13 15  ALKPHOS 113 91  BILITOT 0.7 0.6  PROT 9.7* 7.5  ALBUMIN 3.8 3.0*   No results for input(s): "LIPASE", "AMYLASE" in the last 168 hours. No results for input(s): "AMMONIA" in the last 168 hours. CBC: Recent Labs  Lab 06/28/23 2011 06/29/23 0434  WBC 9.0 9.0  NEUTROABS 7.5  --   HGB 14.0 11.1*  HCT 46.1* 36.3  MCV 90.4 89.6  PLT 200 154   Cardiac Enzymes: No results for input(s): "CKTOTAL", "CKMB", "CKMBINDEX", "TROPONINI" in the last 168 hours. CBG: No results for input(s): "GLUCAP" in the last 168 hours. Iron Studies: No results for input(s): "IRON", "TIBC", "TRANSFERRIN", "FERRITIN" in the last 72 hours. Studies/Results: DG Chest Port 1 View Result Date: 06/28/2023 CLINICAL DATA:  Question of sepsis to evaluate for abnormality. Shortness of breath and headache starting today. EXAM: PORTABLE CHEST 1 VIEW COMPARISON:  06/10/2023 FINDINGS: Postoperative fixation of the thoracic spine. Cardiac enlargement. Mild central vascular congestion. Suggestion of small right pleural effusion with basilar atelectasis. This appears improved since prior study. No pneumothorax. Mediastinal contours appear intact. Calcification of the aorta. Old displaced fracture deformity of the right clavicle. IMPRESSION: 1. Cardiac enlargement. 2. Small right pleural effusion with basilar atelectasis or infiltration, improved since prior study. Electronically Signed   By: Burman Nieves M.D.   On: 06/28/2023 20:48    ROS: Pertinent items are noted in HPI. Physical Exam: Vitals:   06/30/23 0744 06/30/23 0806 06/30/23 0830 06/30/23 0900  BP:  (!) 172/79  (!) 156/79  Pulse:  88  79  Resp:  (!) 25  (!) 26  Temp: 97.6 F (36.4 C)     TempSrc: Oral     SpO2:  97% 99% 100%  Weight:  Height:          Weight change:   Intake/Output Summary (Last 24 hours) at 06/30/2023 1111 Last data filed at 06/30/2023 6578 Gross per 24 hour  Intake 254.17 ml  Output 200 ml  Net 54.17 ml   BP (!) 156/79   Pulse 79   Temp 97.6 F (36.4 C) (Oral)   Resp (!) 26   Ht 5\' 2"  (1.575 m)   Wt 45.4 kg   LMP  (LMP Unknown)   SpO2 100%   BMI 18.29 kg/m  General appearance: alert, cooperative, and no distress Head: Normocephalic, without obvious abnormality, atraumatic Resp: rhonchi bilaterally and wheezes bilaterally Cardio: regular rate and rhythm, S1, S2 normal, no murmur, click, rub or gallop GI: soft, non-tender; bowel sounds normal; no masses,  no organomegaly Extremities: extremities normal, atraumatic, no cyanosis or edema and LUE AVF +T/B Dialysis Access: LUE AVF +T/B  Outpatient HD orders:  [facility]  MWF  No heparin  3 hours Noncompliant Frequently signs off or misses EDW 43 kg  BF 400 DF 500 3K/2.5 calcium  Calcitriol 1 mcg each tx Mircera 175 mcg every 2 weeks - due on 12/30 Left AVF   Assessment/Plan:  Acute hypoxic respiratory failure in setting of RSV infection - per priamry svc  ESRD -  plan for HD today and UF as tolerated  Hypertension/volume  - markedly elevated.  UF with HD and wean off of nitroglycerin drip as able.   Anemia  - stable no changes  Metabolic bone disease -   continue with home meds  Nutrition -  renal diet  Irena Cords, MD [facility], [facility] Pager [phone] 06/30/2023, 11:11 AM

## 2023-06-30 NOTE — TOC Initial Note (Signed)
Transition of Care [facility]) - Initial/Assessment Note    Patient Details  Name: [name] MRN: [id number] Date of Birth: May 22, 1966  [facility]) CM/SW Contact:    Beather Arbour Phone Number: 06/30/2023, 10:49 AM  Clinical Narrative:                 Patient is risk for readmission. CSW completed patient assessment. Patient lives with daughter who is supportive. Patient states that she is independent and has a wheelchair and a hoyer lift at home that came from [employer]. Patient is unable to drive and confirms that she is already established with a home health agency , [facility] for wound care and therapy. Once pt is medically ready, daughter will provide transportation.   Expected Discharge Plan: Home w Home Health Services Barriers to Discharge: Continued Medical Work up   Patient Goals and CMS Choice Patient states their goals for this hospitalization and ongoing recovery are:: return home CMS Medicare.gov Compare Post Acute Care list provided to:: Patient Choice offered to / list presented to : Patient [place] ownership interest in [facility].provided to:: Patient    Expected Discharge Plan and Services In-house Referral: Clinical Social Work Discharge Planning Services: CM Consult Post Acute Care Choice: Durable Medical Equipment, Home Health Living arrangements for the past 2 months: Single Family Home                     Prior Living Arrangements/Services Living arrangements for the past 2 months: Single Family Home Lives with:: Adult Children   Do you feel safe going back to the place where you live?: Yes      Need for Family Participation in Patient Care: Yes (Comment) Care giver support system in place?: Yes (comment) Current home services: DME, Home RN Criminal Activity/Legal Involvement Pertinent to Current Situation/Hospitalization: No - Comment as needed  Activities of Daily Living   ADL Screening (condition at  time of admission) Independently performs ADLs?: No Does the patient have a NEW difficulty with bathing/dressing/toileting/self-feeding that is expected to last >3 days?: No Does the patient have a NEW difficulty with getting in/out of bed, walking, or climbing stairs that is expected to last >3 days?: No Does the patient have a NEW difficulty with communication that is expected to last >3 days?: No Is the patient deaf or have difficulty hearing?: No Does the patient have difficulty seeing, even when wearing glasses/contacts?: No Does the patient have difficulty concentrating, remembering, or making decisions?: No  Permission Sought/Granted      Share Information with NAME: Lippman and [name]     Permission granted to share info w Relationship: Patient and Daughter     Emotional Assessment Appearance:: Appears younger than stated age   Affect (typically observed): Accepting, Appropriate Orientation: : Oriented to Self, Oriented to Place, Oriented to  Time, Oriented to Situation Alcohol / Substance Use: Not Applicable Psych Involvement: No (comment)  Admission diagnosis:  RSV (respiratory syncytial virus infection) [B33.8] Acute respiratory failure with hypoxia (HCC) [J96.01] Hypertensive emergency [I16.1] Patient Active Problem List   Diagnosis Date Noted   RSV (respiratory syncytial virus infection) 06/28/2023   Volume overload 05/20/2023   Legionella pneumonia (HCC) 05/05/2023   Acute respiratory failure with hypoxia (HCC) 05/02/2023   Pulmonary edema 04/13/2023   Diarrhea 04/13/2023   Respiratory failure with hypoxia (HCC) 04/01/2023   Dyspnea and respiratory abnormalities 03/17/2023   Dyspnea 03/05/2023   Retroperitoneal hematoma 12/31/2022   Subcutaneous  hematoma 12/31/2022   Hip osteomyelitis, right (HCC) 12/18/2022   Sepsis due to undetermined organism (HCC) 12/18/2022   Chronic pain 12/14/2022   Bacteremia 11/08/2022   Loss of weight 10/26/2022   N&V (nausea  and vomiting) 10/26/2022   Cellulitis of knee, left 07/24/2022   Infected decubitus ulcer 07/23/2022   Osteomyelitis of multiple sites-chronic     Chronic anemia 02/25/2022   ESRD on hemodialysis (HCC) 02/25/2022   Abscess of buttock, right 08/25/2021   GERD (gastroesophageal reflux disease) 08/25/2021   Anxiety 08/25/2021   Decubitus ulcers 08/25/2021   Anemia 08/24/2021   AKI (acute kidney injury) (HCC) 04/25/2017   Dehydration 04/25/2017   Metabolic acidosis 04/25/2017   Altered mental status 06/22/2016   Altered mental state 08/16/2015   Pressure ulcer 05/16/2015   Acute encephalopathy 05/15/2015   Malnutrition (HCC)    Encephalopathy acute 10/10/2014   Sepsis (HCC) 10/10/2014   PNA (pneumonia) 10/10/2014   Encephalomalacia 10/10/2014   TBI (traumatic brain injury) (HCC) 10/10/2014   Severe protein-calorie malnutrition (HCC) 10/10/2014   UTI (urinary tract infection) 08/26/2014   Hypokalemia 08/26/2014   Abnormal uterine bleeding (AUB) 06/15/2014   Essential hypertension    Seizure disorder (HCC)    Status epilepticus (HCC) 05/02/2014   Hypertensive emergency 05/02/2014   Acute renal failure (HCC) 05/21/2013   Hyperkalemia 05/21/2013   Hypothermia 05/21/2013   Paraplegia (HCC) 06/25/2012   Suprapubic catheter (HCC) 06/25/2012   Decubitus ulcer of buttock 06/25/2012   PCP:  Patient, No Pcp Per Pharmacy:   [facility] - [place], [place] - [address] [place] [address] Phone: [phone] Fax: [phone]     Social Drivers of Health (SDOH) Social History: SDOH Screenings   Food Insecurity: No Food Insecurity (06/29/2023)  Housing: Low Risk  (06/29/2023)  Transportation Needs: Patient Declined (06/29/2023)  Utilities: Not At Risk (06/29/2023)  Tobacco Use: Low Risk  (06/28/2023)   SDOH Interventions:     Readmission Risk Interventions    06/30/2023   10:47 AM 05/21/2023   12:18 PM 05/03/2023    3:06 PM  Readmission Risk  Prevention Plan  Transportation Screening Complete Complete Complete  Medication Review Oceanographer) Complete Complete Complete  PCP or Specialist appointment within 3-5 days of discharge   Complete  HRI or Home Care Consult Complete Complete Complete  SW Recovery Care/Counseling Consult Complete Complete Complete  Palliative Care Screening Not Applicable Not Applicable Not Applicable  Skilled Nursing Facility Not Applicable Not Applicable Not Applicable

## 2023-06-30 NOTE — Progress Notes (Signed)
Pt completed HD tx without issue. Pt tolerated well.  06/30/23 1730  Vitals  Temp 98 F (36.7 C)  Temp Source Oral  BP (!) 180/87  BP Location Right Arm  BP Method Automatic  Patient Position (if appropriate) Lying  Pulse Rate 82  Resp 20  Oxygen Therapy  O2 Device Room Air  During Treatment Monitoring  Intra-Hemodialysis Comments Tx completed  Post Treatment  Dialyzer Clearance Lightly streaked  Hemodialysis Intake (mL) 0 mL  Liters Processed 80  Fluid Removed (mL) 2500 mL  Tolerated HD Treatment Yes  Post-Hemodialysis Comments Pt goal met.  AVG/AVF Arterial Site Held (minutes) 10 minutes  AVG/AVF Venous Site Held (minutes) 10 minutes  Fistula / Graft Left Upper arm Arteriovenous fistula  No placement date or time found.   Placed prior to admission: Yes  Orientation: Left  Access Location: Upper arm  Access Type: Arteriovenous fistula  Site Condition No complications  Fistula / Graft Assessment Present;Thrill;Bruit  Status Deaccessed  Needle Size 15  Drainage Description None

## 2023-06-30 NOTE — Progress Notes (Signed)
Progress Note   Patient: [name] ZOX:[id number] DOB: September 10, 1965 DOA: 06/28/2023     2 DOS: the patient was seen and examined on 06/30/2023   Brief hospital admission course: As per H&P written by Dr. Thomes Dinning on 06/28/2023 [name] is a 58 y.o. female with medical history significant of hypertension, ESRD on HD (MWF) paraplegia with no muscular deficits) suprapubic cath, hypertension, GERD, seizure bilateral sacral and decubitus ulcer who presents to the emergency department due to worsening shortness of breath.  She complained of generalized weakness and fever at home. She was recently admitted from 1/14 through 1/15 due to volume overload/pulmonary edema and hyperkalemia.   ED Course:  In the emergency department, BP was 198/113, other vital signs were within normal range except O2 sats and has subsequently dropped to 85% on room air, supplemental oxygen via [place] at 2 LPM was provided with improvement in O2 sat to 96-97%.  Workup in the ED showed normal CBC except hematocrit of 46.1.  BMP showed sodium 136, potassium 4.5, chloride 93, bicarb 27, glucose 118, BUN 55, creatinine 4.77, GFR 10 anion gap 16.  Lactic acid 1.2.  RSV was positive, influenza A, B and SARS-CoV-2 was negative. Chest x-ray showed cardiac enlargement.  Small right pleural effusion with basilar atelectasis or infiltration, improved since prior study. She was initially treated with ceftriaxone and azithromycin, IV Solu-Medrol 125 mg x 1 was given.  Hospitalist was asked to admit patient for further evaluation and management.  Assessment and Plan: 1-acute respiratory failure with hypoxia in the setting of RSV infection -Continue supportive care and oxygen supplementation -Continue bronchodilator management and the use of his steroids -Patient advised to use incentive spirometer and flutter valve -Continue mucolytic's/antitussive medication -Antipyretics and analgesics will be provided as needed for comfort and  symptomatic management. -continue to Wean off oxygen supplementation and assess the saturation screening.  2-hypertensive emergency -Successfully transition off nitroglycerin drip -Blood pressure stable using home antihypertensive agents -Will continue monitoring vital signs -Patient will have dialysis later today -Transfer to MedSurg bed.  3-SIRS present at time of admission -Experiencing tachycardia and tachypnea -Appears to be associated with lack of home medications (beta-blocker) and the presence of RSV infection -Continue supportive care and treatment as mentioned above -Continue beta-blockers. -Start features essentially resolved.  4-ESRD -Appreciate assistance and recommendation by nephrology service -Planning for hemodialysis later today (06/30/2023).  5-GERD -Continue PPI.  6-history of seizure disorder -Continue Keppra.  7-paraplegia/ambulatory dysfunction and neurogenic bladder -No signs of acute infection or complaints from chronic condition -Continue supportive care and the use of muscle relaxants/analgesics as per home regimen.   Subjective:  Patient off nitroglycerin drip with a stable blood pressure.  No chest pain, no nausea, no vomiting.  Reports breathing is much improved and is currently afebrile.  2 L nasal cannula in place.  Physical Exam: Vitals:   06/30/23 1445 06/30/23 1500 06/30/23 1515 06/30/23 1530  BP: (!) 157/80 (!) 163/76 (!) 161/79 (!) 162/76  Pulse: 80 78 79 75  Resp:      Temp:      TempSrc:      SpO2: 99% 99% 100% 100%  Weight:      Height:       General exam: Alert, awake, oriented x 3; no fever, no nausea, no vomiting, breathing better.  2 L nasal cannula in place Respiratory system: Positive rhonchi, no wheezing. Cardiovascular system:RRR. No rubs or rubs.  No JVD. Gastrointestinal system: Abdomen is nondistended, soft and nontender. No  organomegaly or masses felt. Normal bowel sounds heard. Central nervous system: No new new  focal neurological deficits. Extremities: No cyanosis or clubbing. Skin: No petechiae. Psychiatry: Flat affect appreciated on exam.  Latest data Reviewed: MRSA PCR: Negative Phosphorus: 5.6 Magnesium: 2.0 CBC: WBCs 9.0, hemoglobin 11.1 and platelet count 154K Comprehensive metabolic panel: Sodium 134, potassium 4.5, chloride 95, bicarb 22, BUN 56, creatinine 4.95; normal LFTs and GFR 10.   Family Communication: Daughter updated over the phone at bedside.  Disposition: Status is: Inpatient Remains inpatient appropriate because: Continue blood pressure management; treatment for acute RSV infection and assess desaturation screening.   Planned Discharge Destination: Home  Time: 50 minutes   Author: Vassie Loll, MD 06/30/2023 4:25 PM  For on call review [email or web address].

## 2023-06-30 NOTE — Consult Note (Signed)
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  R ischium 3 cm x 1 cm x 1 cm with 1 cm undermining at 12 o'clock 100% red moist  2.  L ischium 2.5 cm x 2 cm x 0.5 cm with 2 cm depth at 7 o'clock 100% red moist  Wound bed: red moist  Drainage (amount, consistency, odor) moderate tan exudate  Periwound:scar tissue w/pink epithelium  Dressing procedure/placement/frequency: Cleanse B ischial wounds with Vashe wound cleanser [facility] [place] [phone]), cut a piece of silver hydrofiber to fit wound bed and utilizing a Q tip applicator insert into wound bed making sure to cover any areas of undermining.  Cover with dry gauze and silicone foam. Change dressing every M-W-F.    POC discussed with bedside nurse and primary MD. WOC team will not follow. Re-consult if further needs arise.   Thank you,    Priscella Mann MSN, RN-BC, [employer] [phone]

## 2023-07-01 DIAGNOSIS — J9601 Acute respiratory failure with hypoxia: Secondary | ICD-10-CM | POA: Diagnosis not present

## 2023-07-01 DIAGNOSIS — I161 Hypertensive emergency: Secondary | ICD-10-CM | POA: Diagnosis not present

## 2023-07-01 DIAGNOSIS — I10 Essential (primary) hypertension: Secondary | ICD-10-CM | POA: Diagnosis not present

## 2023-07-01 DIAGNOSIS — B338 Other specified viral diseases: Secondary | ICD-10-CM | POA: Diagnosis not present

## 2023-07-01 MED ORDER — CLONIDINE HCL 0.1 MG PO TABS: 0.1000 mg | ORAL_TABLET | Freq: Three times a day (TID) | ORAL | Status: DC

## 2023-07-01 MED ORDER — PREDNISONE 20 MG PO TABS: ORAL_TABLET | ORAL | 0 refills | Status: DC

## 2023-07-01 NOTE — Progress Notes (Signed)
 Patient ID: [name], female   DOB: 02-15-1966, 58 y.o.   MRN: [id number] S:No new complaints.  Tolerated HD well. O:BP (!) 157/78 (BP Location: Right Arm)   Pulse 79   Temp 98.1 F (36.7 C) (Oral)   Resp 18   Ht 5' 2 (1.575 m)   Wt 42.5 kg   LMP  (LMP Unknown)   SpO2 100%   BMI 17.14 kg/m   Intake/Output Summary (Last 24 hours) at 07/01/2023 0842 Last data filed at 07/01/2023 0600 Gross per 24 hour  Intake 360 ml  Output 2700 ml  Net -2340 ml   Intake/Output: I/O last 3 completed shifts: In: 434.2 [P.O.:360; I.V.:74.2] Out: 2900 [Urine:400; Other:2500]  Intake/Output this shift:  No intake/output data recorded. Weight change:  Gen: NAD CVS: RRR Resp: CTA Abd: +BS, soft, NT/ND Ext: no edema, LUE AVF +T/B  Recent Labs  Lab 06/28/23 2011 06/29/23 0434  NA 136 134*  K 4.5 4.5  CL 93* 95*  CO2 27 22  GLUCOSE 118* 130*  BUN 55* 56*  CREATININE 4.77* 4.95*  ALBUMIN  3.8 3.0*  CALCIUM  9.8 9.1  PHOS  --  5.6*  AST 19 21  ALT 13 15   Liver Function Tests: Recent Labs  Lab 06/28/23 2011 06/29/23 0434  AST 19 21  ALT 13 15  ALKPHOS 113 91  BILITOT 0.7 0.6  PROT 9.7* 7.5  ALBUMIN  3.8 3.0*   No results for input(s): LIPASE, AMYLASE in the last 168 hours. No results for input(s): AMMONIA in the last 168 hours. CBC: Recent Labs  Lab 06/28/23 2011 06/29/23 0434  WBC 9.0 9.0  NEUTROABS 7.5  --   HGB 14.0 11.1*  HCT 46.1* 36.3  MCV 90.4 89.6  PLT 200 154   Cardiac Enzymes: No results for input(s): CKTOTAL, CKMB, CKMBINDEX, TROPONINI in the last 168 hours. CBG: No results for input(s): GLUCAP in the last 168 hours.  Iron  Studies: No results for input(s): IRON , TIBC, TRANSFERRIN, FERRITIN in the last 72 hours. Studies/Results: No results found.  amLODipine   5 mg Oral Daily   carvedilol   12.5 mg Oral BID WC   Chlorhexidine  Gluconate Cloth  6 each Topical Daily   Chlorhexidine  Gluconate Cloth  6 each Topical Q0600    cloNIDine   0.1 mg Oral TID   feeding supplement (NEPRO CARB STEADY)  237 mL Oral BID BM   heparin   5,000 Units Subcutaneous Q8H   levETIRAcetam   1,000 mg Oral QPM   pantoprazole   40 mg Oral BID   predniSONE   60 mg Oral Q breakfast   sevelamer  carbonate  800 mg Oral TID WC    BMET    Component Value Date/Time   NA 134 (L) 06/29/2023 0434   NA 137 06/14/2014 1403   K 4.5 06/29/2023 0434   CL 95 (L) 06/29/2023 0434   CO2 22 06/29/2023 0434   GLUCOSE 130 (H) 06/29/2023 0434   BUN 56 (H) 06/29/2023 0434   BUN 10 06/14/2014 1403   CREATININE 4.95 (H) 06/29/2023 0434   CREATININE 0.76 04/18/2015 1424   CALCIUM  9.1 06/29/2023 0434   GFRNONAA 10 (L) 06/29/2023 0434   GFRAA 11 (L) 11/20/2017 1015   CBC    Component Value Date/Time   WBC 9.0 06/29/2023 0434   RBC 4.05 06/29/2023 0434   HGB 11.1 (L) 06/29/2023 0434   HCT 36.3 06/29/2023 0434   PLT 154 06/29/2023 0434   MCV 89.6 06/29/2023 0434   MCH 27.4 06/29/2023 0434  MCHC 30.6 06/29/2023 0434   RDW 19.5 (H) 06/29/2023 0434   RDW 15.0 06/14/2014 1403   LYMPHSABS 0.7 06/28/2023 2011   MONOABS 0.6 06/28/2023 2011   EOSABS 0.1 06/28/2023 2011   BASOSABS 0.1 06/28/2023 2011    Outpatient HD orders:  [facility]  MWF  No heparin   3 hours Noncompliant Frequently signs off or misses EDW 43 kg  BF 400 DF 500 3K/2.5 calcium   Calcitriol  1 mcg each tx Mircera 175 mcg every 2 weeks - due on 12/30 Left AVF    Assessment/Plan:  Acute hypoxic respiratory failure in setting of RSV infection - per priamry svc  ESRD -  tolerated HD yesterday with UF of 2.7 L.  Will arrange for [facility] tomorrow if she is still an inpatient, otherwise she can go to her outpatient appointment.  Hypertension/volume  - markedly elevated.  UF with HD and wean off of nitroglycerin  drip as able.  She is off of ntg drip and below her EDW.  Continue to challenge edw. Bp under better control.  Anemia  - stable no changes  Metabolic bone disease -    continue with home meds  Nutrition -  renal diet  Disposition - possible discharge to home today if she can have home O2 arranged.    [name], MD [employer] [phone]

## 2023-07-01 NOTE — Progress Notes (Signed)
2/4 I spoke to patient via telephone @ [phone] (cell) regarding contents of the IMM Letter, Patient gave verbal consent and acknowledgement.

## 2023-07-01 NOTE — Care Management Important Message (Signed)
Important Message  Patient Details  Name: [name] MRN: [id number] Date of Birth: [birth year]   Important Message Given:  Other (see comment)     Sherilyn Banker 07/01/2023, 10:50 AM

## 2023-07-01 NOTE — Discharge Summary (Signed)
 Physician Discharge Summary   Patient: [name] MRN: [id number] DOB: Sep 14, 1965  Admit date:     06/28/2023  Discharge date: 07/01/23  Discharge Physician: Eric Nunnery   PCP: Patient, No Pcp Per   Recommendations at discharge:  Repeat CBC to follow hemoglobin trend/stability Reassess blood pressure and further adjust antihypertensive regimen as required. Discharge Diagnoses: Principal Problem:   RSV (respiratory syncytial virus infection) Active Problems:   ESRD on hemodialysis (HCC)   Seizure disorder (HCC)   Acute respiratory failure with hypoxia (HCC)   Paraplegia (HCC)   Hypertensive emergency   Essential hypertension   GERD (gastroesophageal reflux disease)  Brief hospital admission course: As per H&P written by Dr. Manfred on 06/28/2023 [name] is a 58 y.o. female with medical history significant of hypertension, ESRD on HD (MWF) paraplegia with no muscular deficits) suprapubic cath, hypertension, GERD, seizure bilateral sacral and decubitus ulcer who presents to the emergency department due to worsening shortness of breath.  She complained of generalized weakness and fever at home. She was recently admitted from 1/14 through 1/15 due to volume overload/pulmonary edema and hyperkalemia.   ED Course:  In the emergency department, BP was 198/113, other vital signs were within normal range except O2 sats and has subsequently dropped to 85% on room air, supplemental oxygen via [place] at 2 LPM was provided with improvement in O2 sat to 96-97%.  Workup in the ED showed normal CBC except hematocrit of 46.1.  BMP showed sodium 136, potassium 4.5, chloride 93, bicarb 27, glucose 118, BUN 55, creatinine 4.77, GFR 10 anion gap 16.  Lactic acid 1.2.  RSV was positive, influenza A, B and SARS-CoV-2 was negative. Chest x-ray showed cardiac enlargement.  Small right pleural effusion with basilar atelectasis or infiltration, improved since prior study. She was initially  treated with ceftriaxone  and azithromycin , IV Solu-Medrol  125 mg x 1 was given.  Hospitalist was asked to admit patient for further evaluation and management.  Assessment and Plan: 1-acute respiratory failure with hypoxia in the setting of RSV infection -Continue supportive care and oxygen supplementation -Continue bronchodilator management and the use of steroids tapering. -Patient advised to use incentive spirometer and flutter valve -Continue mucolytic's/antitussive medication and supportive care. -No oxygen supplementation required at discharge.   2-hypertensive emergency -Successfully transition off nitroglycerin  drip -Blood pressure stable using home antihypertensive agents -Continue to follow vital signs; patient advised to maintain low-sodium diet. -Continue outpatient hemodialysis for adequate dry weight and further blood pressure control.   3-SIRS present at time of admission -Experiencing tachycardia and tachypnea -Appears to be associated with lack of home medications (beta-blocker) and the presence of RSV infection -Continue supportive care and treatment as mentioned above -Continue beta-blockers. -SIRS features essentially resolved.   4-ESRD -Appreciate assistance and recommendation by nephrology service -Last hemodialysis on 06/30/2023 -Next dialysis treatment to 07/02/23   5-GERD -Continue PPI.   6-history of seizure disorder -Continue Keppra .   7-paraplegia/ambulatory dysfunction and neurogenic bladder -No signs of acute infection or complaints from chronic condition -Continue supportive care and the use of muscle relaxants/analgesics as per home regimen.  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 -Wounds present at time of admission and no signs of superimposed infection appreciated.  Consultants: Nephrology service. Procedures performed: See below for x-ray report. Disposition:  Home with home health services. Diet recommendation: Heart healthy/low-sodium diet.  DISCHARGE MEDICATION: Allergies as of 07/01/2023       Reactions   Benadryl [  diphenhydramine Hcl (sleep)] Hives   Daptomycin Hives   Linezolid Other (See Comments)   Patient self-discontinued treatment due to GI intolerance. Taking it along with moxifloxacin   Moxifloxacin Other (See Comments)   Patient self-discontinued treatment due to GI intolerance. Taking it along with linezolid   Quinine Derivatives Other (See Comments)   Alters mental status   Vancomycin  Other (See Comments)   Pt is tolerating this medication at HD   Azithromycin  Itching, Rash   Tetracycline Itching   Able to tolerate Doxycycline .    Zosyn  [piperacillin  Sod-tazobactam So] Rash        Medication List     TAKE these medications    acetaminophen  500 MG tablet Commonly known as: TYLENOL  Take 1,000 mg by mouth every 6 (six) hours as needed for moderate pain.   albuterol  (2.5 MG/3ML) 0.083% nebulizer solution Commonly known as: PROVENTIL  Take 3 mLs (2.5 mg total) by nebulization every 4 (four) hours as needed for wheezing or shortness of breath.   amLODipine  5 MG tablet Commonly known as: NORVASC  Take 1 tablet (5 mg total) by mouth daily. Take 1 tablet on Monday,Wednesday and Friday then take 2 tablets on Tuesday,Thursday,Saturday and Sunday.   carvedilol  12.5 MG tablet Commonly known as: COREG  Take 1 tablet (12.5 mg total) by mouth 2 (two) times daily with a meal.   cloNIDine  0.1 MG tablet Commonly known as: CATAPRES  Take 1 tablet (0.1 mg total) by mouth 3 (three) times daily. What changed: when to take this   cyclobenzaprine  5 MG tablet Commonly known as: FLEXERIL  Take 5 mg by mouth 3 (three) times daily as needed for muscle spasms.   furosemide  80 MG tablet Commonly known as: LASIX  Take 80 mg by mouth 4 (four) times a week. What changed: Another medication with the same name was removed. Continue taking  this medication, and follow the directions you see here.   levETIRAcetam  500 MG tablet Commonly known as: KEPPRA  Take 2 tablets (1,000 mg total) by mouth every evening. Take an additional tablet on Monday,Wednesday and Friday evening after Dialysis   lidocaine -prilocaine  cream Commonly known as: EMLA  Apply 1 Application topically 3 (three) times a week.   pantoprazole  40 MG tablet Commonly known as: PROTONIX  Take 1 tablet (40 mg total) by mouth daily.   predniSONE  20 MG tablet Commonly known as: DELTASONE  Take 2 tablets by mouth daily x 2 days; then 1 tablet by mouth daily x 3 days; then half tablet by mouth daily x 3 days and stop prednisone . Start taking on: July 02, 2023   senna-docusate 8.6-50 MG tablet Commonly known as: Senokot-S Take 2 tablets by mouth at bedtime.   sevelamer  carbonate 800 MG tablet Commonly known as: RENVELA  Take 800 mg by mouth 3 (three) times daily.               Discharge Care Instructions  (From admission, onward)           Start     Ordered   07/01/23 0000  Discharge wound care:       Comments: Resume prior to admission wound care; continue constant repositioning and preventive measures.   07/01/23 1030            Discharge Exam: Filed Weights   06/28/23 1527 06/30/23 1424 06/30/23 1746  Weight: 45.4 kg 45 kg 42.5 kg   General exam: Alert, awake, oriented x 3; afebrile and in no acute distress. Respiratory system: Good air movement bilaterally; no using accessory muscles. Cardiovascular system:RRR.  No rubs or gallops. Gastrointestinal system: Abdomen is nondistended, soft and nontender. No organomegaly or masses felt. Normal bowel sounds heard. Central nervous system: No new focal neurological deficits. Extremities: No cyanosis, clubbing or edema. Skin: No petechiae; present at time of admission and stage IV bilateral ischial pressure injuries without signs of superimposed infection. Psychiatry: Flat  affect.   Condition at discharge: good  The results of significant diagnostics from this hospitalization (including imaging, microbiology, ancillary and laboratory) are listed below for reference.   Imaging Studies: DG Chest Port 1 View Result Date: 06/28/2023 CLINICAL DATA:  Question of sepsis to evaluate for abnormality. Shortness of breath and headache starting today. EXAM: PORTABLE CHEST 1 VIEW COMPARISON:  06/10/2023 FINDINGS: Postoperative fixation of the thoracic spine. Cardiac enlargement. Mild central vascular congestion. Suggestion of small right pleural effusion with basilar atelectasis. This appears improved since prior study. No pneumothorax. Mediastinal contours appear intact. Calcification of the aorta. Old displaced fracture deformity of the right clavicle. IMPRESSION: 1. Cardiac enlargement. 2. Small right pleural effusion with basilar atelectasis or infiltration, improved since prior study. Electronically Signed   By: Elsie Gravely M.D.   On: 06/28/2023 20:48   CT Head Wo Contrast Result Date: 06/10/2023 CLINICAL DATA:  Headache, new onset (Age >= 51y) EXAM: CT HEAD WITHOUT CONTRAST TECHNIQUE: Contiguous axial images were obtained from the base of the skull through the vertex without intravenous contrast. RADIATION DOSE REDUCTION: This exam was performed according to the departmental dose-optimization program which includes automated exposure control, adjustment of the mA and/or kV according to patient size and/or use of iterative reconstruction technique. COMPARISON:  CT head 05/20/2023. FINDINGS: Brain: Remote left frontal infarct, similar. Similar cerebral atrophy. No evidence of acute large vascular territory infarct, acute hemorrhage, mass lesion, midline shift or hydrocephalus. Vascular: Calcific atherosclerosis. No hyperdense vessel identified. Skull: No acute fracture. Sinuses/Orbits: Clear sinuses.  No acute orbital findings. Other: No mastoid effusions. IMPRESSION: 1. Stable  head CT.  No evidence of acute intracranial abnormality. 2. 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 Electronically Signed   By: Gilmore GORMAN Molt M.D.   On: 06/10/2023 10:11   DG Chest 2 View Result Date: 06/10/2023 CLINICAL DATA:  Shortness of breath EXAM: CHEST - 2 VIEW COMPARISON:  Chest radiograph dated 05/20/2023. FINDINGS: There is cardiomegaly with vascular congestion and mild edema. Small bilateral pleural effusions and associated atelectasis or infiltrate. No pneumothorax. No acute osseous pathology. Degenerative changes of the spine and mild scoliosis. Spinal Harrington rod. IMPRESSION: Findings of CHF and small bilateral pleural effusions. Pneumonia is not excluded. Electronically Signed   By: Vanetta Chou M.D.   On: 06/10/2023 09:49    Microbiology: Results for orders placed or performed during the hospital encounter of 06/28/23  Resp panel by RT-PCR (RSV, Flu A&B, Covid) Anterior Nasal Swab     Status: Abnormal   Collection Time: 06/28/23  8:11 PM   Specimen: Anterior Nasal Swab  Result Value Ref Range Status   SARS Coronavirus 2 by RT PCR NEGATIVE NEGATIVE Final    Comment: (NOTE) SARS-CoV-2 target nucleic acids are NOT DETECTED.  The SARS-CoV-2 RNA is generally detectable in upper respiratory specimens during the acute phase of infection. The lowest concentration of SARS-CoV-2 viral copies this assay can detect is 138 copies/mL. A negative result does not preclude SARS-Cov-2 infection and should not be used as the sole basis for treatment or other patient management decisions. A negative result may occur with  improper specimen collection/handling, submission of specimen  other than nasopharyngeal swab, presence of viral mutation(s) within the areas targeted by this assay, and inadequate number of viral copies(<138 copies/mL). A negative result must be combined with clinical observations, patient history, and epidemiological information.  The expected result is Negative.  Fact Sheet for Patients:  bloggercourse.com  Fact Sheet for Healthcare Providers:  seriousbroker.it  This test is no t yet approved or cleared by the [place]  FDA and  has been authorized for detection and/or diagnosis of SARS-CoV-2 by FDA under an Emergency Use Authorization (EUA). This EUA will remain  in effect (meaning this test can be used) for the duration of the COVID-19 declaration under Section 564(b)(1) of the Act, 21 U.S.C.section 360bbb-3(b)(1), unless the authorization is terminated  or revoked sooner.       Influenza A by PCR NEGATIVE NEGATIVE Final   Influenza B by PCR NEGATIVE NEGATIVE Final    Comment: (NOTE) The Xpert Xpress SARS-CoV-2/FLU/RSV plus assay is intended as an aid in the diagnosis of influenza from Nasopharyngeal swab specimens and should not be used as a sole basis for treatment. Nasal washings and aspirates are unacceptable for Xpert Xpress SARS-CoV-2/FLU/RSV testing.  Fact Sheet for Patients: bloggercourse.com  Fact Sheet for Healthcare Providers: seriousbroker.it  This test is not yet approved or cleared by the [place]  FDA and has been authorized for detection and/or diagnosis of SARS-CoV-2 by FDA under an Emergency Use Authorization (EUA). This EUA will remain in effect (meaning this test can be used) for the duration of the COVID-19 declaration under Section 564(b)(1) of the Act, 21 U.S.C. section 360bbb-3(b)(1), unless the authorization is terminated or revoked.     Resp Syncytial Virus by PCR POSITIVE (A) NEGATIVE Final    Comment: (NOTE) Fact Sheet for Patients: bloggercourse.com  Fact Sheet for Healthcare Providers: seriousbroker.it  This test is not yet approved or cleared by the [place]  FDA and has been authorized for detection  and/or diagnosis of SARS-CoV-2 by FDA under an Emergency Use Authorization (EUA). This EUA will remain in effect (meaning this test can be used) for the duration of the COVID-19 declaration under Section 564(b)(1) of the Act, 21 U.S.C. section 360bbb-3(b)(1), unless the authorization is terminated or revoked.  Performed at [facility], [address] [place], [place] [address]   Blood Culture (routine x 2)     Status: None (Preliminary result)   Collection Time: 06/28/23  8:11 PM   Specimen: BLOOD  Result Value Ref Range Status   Specimen Description BLOOD  Final   Special Requests NONE  Final   Culture   Final    NO GROWTH 3 DAYS Performed at [facility], [address], [place], [place] [address]    Report Status PENDING  Incomplete  Blood Culture (routine x 2)     Status: None (Preliminary result)   Collection Time: 06/28/23  8:16 PM   Specimen: BLOOD  Result Value Ref Range Status   Specimen Description BLOOD  Final   Special Requests NONE  Final   Culture   Final    NO GROWTH 3 DAYS Performed at [facility], [address], [place], [place] [address]    Report Status PENDING  Incomplete  MRSA Next Gen by PCR, Nasal     Status: None   Collection Time: 06/29/23  9:27 AM   Specimen: Nasal Mucosa; Nasal Swab  Result Value Ref Range Status   MRSA by PCR Next Gen NOT DETECTED NOT DETECTED Final    Comment: (NOTE) The GeneXpert  MRSA Assay (FDA approved for NASAL specimens only), is one component of a comprehensive MRSA colonization surveillance program. It is not intended to diagnose MRSA infection nor to guide or monitor treatment for MRSA infections. Test performance is not FDA approved in patients less than 21 years old. Performed at [facility], [address], [place], [place] [address]     Labs: CBC: Recent Labs  Lab 06/28/23 2011 06/29/23 0434  WBC 9.0 9.0  NEUTROABS 7.5  --   HGB 14.0 11.1*  HCT 46.1* 36.3  MCV 90.4 89.6  PLT 200 154   Basic Metabolic  Panel: Recent Labs  Lab 06/28/23 2011 06/29/23 0434  NA 136 134*  K 4.5 4.5  CL 93* 95*  CO2 27 22  GLUCOSE 118* 130*  BUN 55* 56*  CREATININE 4.77* 4.95*  CALCIUM  9.8 9.1  MG  --  2.0  PHOS  --  5.6*   Liver Function Tests: Recent Labs  Lab 06/28/23 2011 06/29/23 0434  AST 19 21  ALT 13 15  ALKPHOS 113 91  BILITOT 0.7 0.6  PROT 9.7* 7.5  ALBUMIN  3.8 3.0*   CBG: No results for input(s): GLUCAP in the last 168 hours.  Discharge time spent: greater than 30 minutes.  Signed: Eric Nunnery, MD [facility] 07/01/2023

## 2023-07-01 NOTE — TOC Transition Note (Signed)
 Transition of Care [facility] - Discharge Note   Patient Details  Name: [name] MRN: [id number] Date of Birth: 09-11-1965  [facility] CM/SW Contact:  Sharlyne Stabs, RN Phone Number: 07/01/2023, 11:18 AM   Clinical Narrative:   Patient is discharging home with [facility] home health. Orders placed and Darice updated. RN completed home oxygen sat qualification. Patient is saying on room air at 90-92 %. No other needs for discharge.     Final next level of care: Home w Home Health Services Barriers to Discharge: Barriers Resolved   Patient Goals and CMS Choice Patient states their goals for this hospitalization and ongoing recovery are:: return home CMS Medicare.gov Compare Post Acute Care list provided to:: Patient Choice offered to / list presented to : Patient [place] ownership interest in [facility].provided to:: Patient    Discharge Placement                  Patient and family notified of of transfer: 07/01/23  Discharge Plan and Services Additional resources added to the After Visit Summary for   In-house Referral: Clinical Social Work Discharge Planning Services: CM Consult Post Acute Care Choice: Durable Medical Equipment, Home Health               Social Drivers of Health (SDOH) Interventions SDOH Screenings   Food Insecurity: No Food Insecurity (06/29/2023)  Housing: Low Risk  (06/29/2023)  Transportation Needs: Patient Declined (06/29/2023)  Utilities: Not At Risk (06/29/2023)  Tobacco Use: Low Risk  (06/28/2023)     Readmission Risk Interventions    06/30/2023   10:47 AM 05/21/2023   12:18 PM 05/03/2023    3:06 PM  Readmission Risk Prevention Plan  Transportation Screening Complete Complete Complete  Medication Review Oceanographer) Complete Complete Complete  PCP or Specialist appointment within 3-5 days of discharge   Complete  HRI or Home Care Consult Complete Complete Complete  SW Recovery Care/Counseling Consult  Complete Complete Complete  Palliative Care Screening Not Applicable Not Applicable Not Applicable  Skilled Nursing Facility Not Applicable Not Applicable Not Applicable

## 2023-07-03 LAB — CULTURE, BLOOD (ROUTINE X 2)
Culture: NO GROWTH
Culture: NO GROWTH

## 2023-07-10 ENCOUNTER — Inpatient Hospital Stay (HOSPITAL_COMMUNITY)
Admission: EM | Admit: 2023-07-10 | Discharge: 2023-07-14 | DRG: 871 | Disposition: A | Discharge status: Home Health | Payer: 59 | Attending: Family Medicine | Admitting: Family Medicine

## 2023-07-10 ENCOUNTER — Other Ambulatory Visit: Payer: Self-pay

## 2023-07-10 ENCOUNTER — Emergency Department (HOSPITAL_COMMUNITY): Payer: 59

## 2023-07-10 ENCOUNTER — Encounter (HOSPITAL_COMMUNITY): Payer: Self-pay

## 2023-07-10 DIAGNOSIS — L8990 Pressure ulcer of unspecified site, unspecified stage: Secondary | ICD-10-CM

## 2023-07-10 DIAGNOSIS — Z8249 Family history of ischemic heart disease and other diseases of the circulatory system: Secondary | ICD-10-CM

## 2023-07-10 DIAGNOSIS — Z1152 Encounter for screening for COVID-19: Secondary | ICD-10-CM | POA: Diagnosis not present

## 2023-07-10 DIAGNOSIS — J189 Pneumonia, unspecified organism: Secondary | ICD-10-CM | POA: Diagnosis present

## 2023-07-10 DIAGNOSIS — Z8542 Personal history of malignant neoplasm of other parts of uterus: Secondary | ICD-10-CM

## 2023-07-10 DIAGNOSIS — I4510 Unspecified right bundle-branch block: Secondary | ICD-10-CM | POA: Diagnosis present

## 2023-07-10 DIAGNOSIS — I120 Hypertensive chronic kidney disease with stage 5 chronic kidney disease or end stage renal disease: Secondary | ICD-10-CM | POA: Diagnosis present

## 2023-07-10 DIAGNOSIS — D631 Anemia in chronic kidney disease: Secondary | ICD-10-CM | POA: Diagnosis present

## 2023-07-10 DIAGNOSIS — Z833 Family history of diabetes mellitus: Secondary | ICD-10-CM | POA: Diagnosis not present

## 2023-07-10 DIAGNOSIS — Z880 Allergy status to penicillin: Secondary | ICD-10-CM

## 2023-07-10 DIAGNOSIS — N186 End stage renal disease: Secondary | ICD-10-CM | POA: Diagnosis present

## 2023-07-10 DIAGNOSIS — J101 Influenza due to other identified influenza virus with other respiratory manifestations: Secondary | ICD-10-CM | POA: Diagnosis present

## 2023-07-10 DIAGNOSIS — Z7952 Long term (current) use of systemic steroids: Secondary | ICD-10-CM

## 2023-07-10 DIAGNOSIS — J440 Chronic obstructive pulmonary disease with acute lower respiratory infection: Secondary | ICD-10-CM | POA: Diagnosis present

## 2023-07-10 DIAGNOSIS — E43 Unspecified severe protein-calorie malnutrition: Secondary | ICD-10-CM | POA: Diagnosis present

## 2023-07-10 DIAGNOSIS — A419 Sepsis, unspecified organism: Secondary | ICD-10-CM | POA: Diagnosis present

## 2023-07-10 DIAGNOSIS — Z681 Body mass index (BMI) 19 or less, adult: Secondary | ICD-10-CM

## 2023-07-10 DIAGNOSIS — L89212 Pressure ulcer of right hip, stage 2: Secondary | ICD-10-CM | POA: Diagnosis present

## 2023-07-10 DIAGNOSIS — G40909 Epilepsy, unspecified, not intractable, without status epilepticus: Secondary | ICD-10-CM

## 2023-07-10 DIAGNOSIS — J9811 Atelectasis: Secondary | ICD-10-CM | POA: Diagnosis not present

## 2023-07-10 DIAGNOSIS — L89152 Pressure ulcer of sacral region, stage 2: Secondary | ICD-10-CM | POA: Diagnosis present

## 2023-07-10 DIAGNOSIS — Z888 Allergy status to other drugs, medicaments and biological substances status: Secondary | ICD-10-CM

## 2023-07-10 DIAGNOSIS — M898X9 Other specified disorders of bone, unspecified site: Secondary | ICD-10-CM | POA: Diagnosis present

## 2023-07-10 DIAGNOSIS — I10 Essential (primary) hypertension: Secondary | ICD-10-CM | POA: Diagnosis present

## 2023-07-10 DIAGNOSIS — Z992 Dependence on renal dialysis: Secondary | ICD-10-CM

## 2023-07-10 DIAGNOSIS — E871 Hypo-osmolality and hyponatremia: Secondary | ICD-10-CM

## 2023-07-10 DIAGNOSIS — L89222 Pressure ulcer of left hip, stage 2: Secondary | ICD-10-CM | POA: Diagnosis present

## 2023-07-10 DIAGNOSIS — J9601 Acute respiratory failure with hypoxia: Secondary | ICD-10-CM | POA: Diagnosis present

## 2023-07-10 DIAGNOSIS — Z881 Allergy status to other antibiotic agents status: Secondary | ICD-10-CM

## 2023-07-10 DIAGNOSIS — J810 Acute pulmonary edema: Principal | ICD-10-CM

## 2023-07-10 DIAGNOSIS — Z993 Dependence on wheelchair: Secondary | ICD-10-CM

## 2023-07-10 DIAGNOSIS — L89202 Pressure ulcer of unspecified hip, stage 2: Secondary | ICD-10-CM | POA: Diagnosis not present

## 2023-07-10 DIAGNOSIS — G8220 Paraplegia, unspecified: Secondary | ICD-10-CM

## 2023-07-10 DIAGNOSIS — Z79899 Other long term (current) drug therapy: Secondary | ICD-10-CM

## 2023-07-10 LAB — BASIC METABOLIC PANEL
Anion gap: 11 (ref 5–15)
BUN: 42 mg/dL — ABNORMAL HIGH (ref 6–20)
CO2: 23 mmol/L (ref 22–32)
Calcium: 8.7 mg/dL — ABNORMAL LOW (ref 8.9–10.3)
Chloride: 100 mmol/L (ref 98–111)
Creatinine, Ser: 3.66 mg/dL — ABNORMAL HIGH (ref 0.44–1.00)
GFR, Estimated: 14 mL/min — ABNORMAL LOW (ref >60)
Glucose, Bld: 92 mg/dL (ref 70–99)
Potassium: 4.4 mmol/L (ref 3.5–5.1)
Sodium: 134 mmol/L — ABNORMAL LOW (ref 135–145)

## 2023-07-10 LAB — CBC WITH DIFFERENTIAL/PLATELET
Abs Immature Granulocytes: 0 10*3/uL (ref 0.00–0.07)
Band Neutrophils: 5 %
Basophils Absolute: 0.3 10*3/uL — ABNORMAL HIGH (ref 0.0–0.1)
Basophils Relative: 1 %
Eosinophils Absolute: 0 10*3/uL (ref 0.0–0.5)
Eosinophils Relative: 0 %
HCT: 39.7 % (ref 36.0–46.0)
Hemoglobin: 12.2 g/dL (ref 12.0–15.0)
Lymphocytes Relative: 4 %
Lymphs Abs: 1.2 10*3/uL (ref 0.7–4.0)
MCH: 28.1 pg (ref 26.0–34.0)
MCHC: 30.7 g/dL (ref 30.0–36.0)
MCV: 91.5 fL (ref 80.0–100.0)
Monocytes Absolute: 0.9 10*3/uL (ref 0.1–1.0)
Monocytes Relative: 3 %
Neutro Abs: 26.9 10*3/uL — ABNORMAL HIGH (ref 1.7–7.7)
Neutrophils Relative %: 87 %
Platelets: 149 10*3/uL — ABNORMAL LOW (ref 150–400)
RBC: 4.34 MIL/uL (ref 3.87–5.11)
RDW: 20.1 % — ABNORMAL HIGH (ref 11.5–15.5)
Smear Review: DECREASED
WBC: 29.2 10*3/uL — ABNORMAL HIGH (ref 4.0–10.5)
nRBC: 0 % (ref 0.0–0.2)
nRBC: 1 /100{WBCs} — ABNORMAL HIGH

## 2023-07-10 LAB — HEPATITIS B SURFACE ANTIGEN: Hepatitis B Surface Ag: NONREACTIVE

## 2023-07-10 LAB — PROCALCITONIN: Procalcitonin: 5.21 ng/mL

## 2023-07-10 LAB — RESP PANEL BY RT-PCR (RSV, FLU A&B, COVID)  RVPGX2
Influenza A by PCR: NEGATIVE
Influenza B by PCR: NEGATIVE
Resp Syncytial Virus by PCR: NEGATIVE
SARS Coronavirus 2 by RT PCR: NEGATIVE

## 2023-07-10 MED ORDER — VANCOMYCIN HCL IN DEXTROSE 1-5 GM/200ML-% IV SOLN
1000.0000 mg | Freq: Once | INTRAVENOUS | Status: AC
  Administered 2023-07-10: 1000 mg via INTRAVENOUS
  Filled 2023-07-10: qty 200

## 2023-07-10 MED ORDER — LEVETIRACETAM 500 MG PO TABS: 500.0000 mg | ORAL_TABLET | ORAL | Status: DC

## 2023-07-10 MED ORDER — ALBUTEROL SULFATE (2.5 MG/3ML) 0.083% IN NEBU
2.5000 mg | INHALATION_SOLUTION | RESPIRATORY_TRACT | Status: DC | PRN
  Administered 2023-07-11 – 2023-07-13 (×2): 2.5 mg via RESPIRATORY_TRACT
  Filled 2023-07-10 (×2): qty 3

## 2023-07-10 MED ORDER — FUROSEMIDE 40 MG PO TABS
80.0000 mg | ORAL_TABLET | ORAL | Status: DC
  Administered 2023-07-10 – 2023-07-13 (×3): 80 mg via ORAL
  Filled 2023-07-10 (×3): qty 2

## 2023-07-10 MED ORDER — LEVETIRACETAM 500 MG PO TABS
1000.0000 mg | ORAL_TABLET | Freq: Every evening | ORAL | Status: DC
  Administered 2023-07-10 – 2023-07-14 (×5): 1000 mg via ORAL
  Filled 2023-07-10 (×5): qty 2

## 2023-07-10 MED ORDER — PROCHLORPERAZINE EDISYLATE 10 MG/2ML IJ SOLN
10.0000 mg | Freq: Once | INTRAMUSCULAR | Status: AC
  Administered 2023-07-10: 10 mg via INTRAVENOUS
  Filled 2023-07-10: qty 2

## 2023-07-10 MED ORDER — SENNOSIDES-DOCUSATE SODIUM 8.6-50 MG PO TABS
2.0000 | ORAL_TABLET | Freq: Every day | ORAL | Status: DC
  Administered 2023-07-10 – 2023-07-13 (×4): 2 via ORAL
  Filled 2023-07-10 (×4): qty 2

## 2023-07-10 MED ORDER — ACETAMINOPHEN 500 MG PO TABS
1000.0000 mg | ORAL_TABLET | Freq: Four times a day (QID) | ORAL | Status: DC | PRN
  Administered 2023-07-10: 650 mg via ORAL
  Administered 2023-07-12 – 2023-07-14 (×2): 1000 mg via ORAL
  Filled 2023-07-10 (×2): qty 2

## 2023-07-10 MED ORDER — SEVELAMER CARBONATE 800 MG PO TABS
800.0000 mg | ORAL_TABLET | Freq: Three times a day (TID) | ORAL | Status: DC
  Administered 2023-07-10 – 2023-07-14 (×12): 800 mg via ORAL
  Filled 2023-07-10 (×11): qty 1

## 2023-07-10 MED ORDER — CLONIDINE HCL 0.1 MG PO TABS
0.1000 mg | ORAL_TABLET | Freq: Three times a day (TID) | ORAL | Status: DC
  Administered 2023-07-10 – 2023-07-14 (×11): 0.1 mg via ORAL
  Filled 2023-07-10 (×12): qty 1

## 2023-07-10 MED ORDER — LIDOCAINE-PRILOCAINE 2.5-2.5 % EX CREA
1.0000 | TOPICAL_CREAM | CUTANEOUS | Status: DC
  Administered 2023-07-14: 1 via TOPICAL
  Filled 2023-07-10 (×2): qty 5

## 2023-07-10 MED ORDER — METOPROLOL TARTRATE 5 MG/5ML IV SOLN
5.0000 mg | Freq: Four times a day (QID) | INTRAVENOUS | Status: DC | PRN
  Administered 2023-07-14: 5 mg via INTRAVENOUS
  Filled 2023-07-10: qty 5

## 2023-07-10 MED ORDER — CHLORHEXIDINE GLUCONATE CLOTH 2 % EX PADS
6.0000 | MEDICATED_PAD | Freq: Every day | CUTANEOUS | Status: DC
  Administered 2023-07-12 – 2023-07-14 (×3): 6 via TOPICAL

## 2023-07-10 MED ORDER — CYCLOBENZAPRINE HCL 10 MG PO TABS
5.0000 mg | ORAL_TABLET | Freq: Three times a day (TID) | ORAL | Status: DC | PRN
  Administered 2023-07-14: 5 mg via ORAL
  Filled 2023-07-10: qty 1

## 2023-07-10 MED ORDER — PENTAFLUOROPROP-TETRAFLUOROETH EX AERO: 1.0000 | INHALATION_SPRAY | CUTANEOUS | Status: DC | PRN

## 2023-07-10 MED ORDER — PANTOPRAZOLE SODIUM 40 MG PO TBEC
40.0000 mg | DELAYED_RELEASE_TABLET | Freq: Every day | ORAL | Status: DC
  Administered 2023-07-10 – 2023-07-14 (×5): 40 mg via ORAL
  Filled 2023-07-10 (×5): qty 1

## 2023-07-10 MED ORDER — CEFTRIAXONE SODIUM 2 G IJ SOLR
2.0000 g | Freq: Once | INTRAMUSCULAR | Status: AC
  Administered 2023-07-10: 2 g via INTRAVENOUS
  Filled 2023-07-10: qty 20

## 2023-07-10 MED ORDER — VANCOMYCIN HCL 500 MG/100ML IV SOLN
500.0000 mg | INTRAVENOUS | Status: DC
  Administered 2023-07-10: 500 mg via INTRAVENOUS
  Filled 2023-07-10 (×3): qty 100

## 2023-07-10 MED ORDER — SODIUM CHLORIDE 0.9 % IV SOLN
500.0000 mg | INTRAVENOUS | Status: DC
  Administered 2023-07-10 – 2023-07-12 (×3): 500 mg via INTRAVENOUS
  Filled 2023-07-10 (×3): qty 5

## 2023-07-10 MED ORDER — IOHEXOL 300 MG/ML  SOLN
80.0000 mL | Freq: Once | INTRAMUSCULAR | Status: AC | PRN
  Administered 2023-07-10: 80 mL via INTRAVENOUS

## 2023-07-10 MED ORDER — CALCITRIOL 0.25 MCG PO CAPS
1.0000 ug | ORAL_CAPSULE | ORAL | Status: DC
  Administered 2023-07-11 – 2023-07-14 (×2): 1 ug via ORAL
  Filled 2023-07-10 (×2): qty 4

## 2023-07-10 MED ORDER — ACETAMINOPHEN 325 MG PO TABS
ORAL_TABLET | ORAL | Status: AC
  Filled 2023-07-10: qty 2

## 2023-07-10 MED ORDER — HEPARIN SODIUM (PORCINE) 5000 UNIT/ML IJ SOLN
5000.0000 [IU] | Freq: Three times a day (TID) | INTRAMUSCULAR | Status: DC
  Administered 2023-07-10 – 2023-07-14 (×12): 5000 [IU] via SUBCUTANEOUS
  Filled 2023-07-10 (×12): qty 1

## 2023-07-10 MED ORDER — SODIUM CHLORIDE 0.9 % IV SOLN
2.0000 g | INTRAVENOUS | Status: DC
  Administered 2023-07-11 – 2023-07-13 (×3): 2 g via INTRAVENOUS
  Filled 2023-07-10 (×3): qty 20

## 2023-07-10 NOTE — Progress Notes (Signed)
Pharmacy Antibiotic Note  [name] is a 58 y.o. female admitted on 07/10/2023 with  respiratory failure and possible pneumonia .  Pharmacy has been consulted for vancomycin dosing.  Patient is ESRD MWF. Nephrology is planning HD today and again tomorrow to get back on schedule. Patient afebrile, wbc elevated at 29.   Plan: Vancomycin 1g IV now then 500mg  after each HD session Follow up HD schedule  Height: 5\' 2"  (157.5 cm) Weight: 42.5 kg (93 lb 11.1 oz) IBW/kg (Calculated) : 50.1  Temp (24hrs), Avg:98.2 F (36.8 C), Min:97.9 F (36.6 C), Max:98.4 F (36.9 C)  Recent Labs  Lab 07/10/23 0549  WBC 29.2*  CREATININE 3.66*    Estimated Creatinine Clearance: 11.7 mL/min (A) (by C-G formula based on SCr of 3.66 mg/dL (H)).    Allergies  Allergen Reactions   Benadryl [Diphenhydramine Hcl (Sleep)] Hives   Daptomycin Hives   Linezolid Other (See Comments)    Patient self-discontinued treatment due to GI intolerance. Taking it along with moxifloxacin   Moxifloxacin Other (See Comments)    Patient self-discontinued treatment due to GI intolerance. Taking it along with linezolid   Quinine Derivatives Other (See Comments)    Alters mental status   Vancomycin Other (See Comments)    Pt is tolerating this medication at HD   Azithromycin Itching and Rash   Tetracycline Itching    Able to tolerate Doxycycline.    Zosyn [Piperacillin Sod-Tazobactam So] Rash    Thank you for allowing pharmacy to be a part of this patient's care.  Sheppard Coil PharmD., BCPS Clinical Pharmacist 07/10/2023 11:16 AM

## 2023-07-10 NOTE — Progress Notes (Signed)
   07/10/23 1718  Vitals  Pulse Rate 100  Resp (!) 24  BP 99/70  SpO2 100 %  O2 Device Nasal Cannula  Oxygen Therapy  O2 Flow Rate (L/min) 2 L/min  Patient Activity (if Appropriate) In bed  Pulse Oximetry Type Continuous  Oximetry Probe Site Changed No  During Treatment Monitoring  HD Safety Checks Performed Yes  Intra-Hemodialysis Comments Tx completed   Received patient in bed to unit.  Alert and oriented.  Informed consent signed and in chart.   TX duration: 4 hours  Patient tolerated well.  Transported back to ED. Alert, without acute distress.  Hand-off given to patient's nurse.   Access used: LAVF Access issues: None  Total UF removed: 3900 Medication(s) given: None   Mar Daring, LPN  [facility]
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
## 2023-07-10 NOTE — Progress Notes (Signed)
Machine stopped due to air in the line. New cartridge and dialyzer applied. See flow sheet.

## 2023-07-10 NOTE — Procedures (Signed)
HD Note:  Some information was entered later than the data was gathered due to patient care needs. The stated time with the data is accurate.  Received patient in stretcher to unit.   Alert and oriented.   Informed consent signed and in chart.   Patient sleeping when arrived to the unit.  Access used: Upper left arm fistula Access issues: None  Machine had air in line alert. With this issue, blood was not able to be returned.  Approximately 300 ml of blood loss with this event.  Patient c/o headache of 8/10 during treatment, see MAR  Patient complaint of cramping in her hands and, UF turned off for the last 5 min of treatment.  TX duration:4 hours  Alert, without acute distress.  Total UF removed: 3900 ml  Hand-off given to patient's nurse.   Transported to the ED   Syon Tews L. Dareen Piano, RN [facility].

## 2023-07-10 NOTE — Progress Notes (Signed)
Patient came in on NRB at 10L per EMT.  Was able to place patient on [place] at 3L; sat is 94%, RN made aware.

## 2023-07-10 NOTE — ED Triage Notes (Signed)
Rcems from home. Cc of SOB from the flu. 88% while doing home neb. +HTN.  220/140 initial  with ems 190/90 now.  EMS placed her on 10l NRB. Rec'd breathing tx en route. Dx with flu last week.  Had dialysis yesterday (2/12) and has felt bad since.

## 2023-07-10 NOTE — ED Provider Notes (Signed)
[place] EMERGENCY DEPARTMENT AT [facility] Provider Note   CSN: [id number] Arrival date & time: 07/10/23  0272     History  Chief Complaint  Patient presents with   Shortness of Breath    Flu+    [name] is a 58 y.o. female.  The history is provided by the patient.  Shortness of Breath She has history of hypertension, seizures, paraplegia and comes in because of shortness of breath and headache.  She was dialyzed yesterday and states that she was short of breath and had a headache at dialysis.  Blood pressure has been elevated since then.  She denies chest pain, heaviness, tightness, pressure.  She has had some nausea and vomiting.  EMS noted initial blood pressure 220/140 and oxygen saturation of 88% which improved when she was placed on supplemental oxygen.   Home Medications Prior to Admission medications   Medication Sig Start Date End Date Taking? Authorizing Provider  acetaminophen (TYLENOL) 500 MG tablet Take 1,000 mg by mouth every 6 (six) hours as needed for moderate pain.    [provider]  albuterol (PROVENTIL) (2.5 MG/3ML) 0.083% nebulizer solution Take 3 mLs (2.5 mg total) by nebulization every 4 (four) hours as needed for wheezing or shortness of breath. 04/15/23 04/14/24  Shon Hale, MD  amLODipine (NORVASC) 5 MG tablet Take 1 tablet (5 mg total) by mouth daily. Take 1 tablet on Monday,Wednesday and Friday then take 2 tablets on Tuesday,Thursday,Saturday and Sunday. 04/15/23   Shon Hale, MD  carvedilol (COREG) 12.5 MG tablet Take 1 tablet (12.5 mg total) by mouth 2 (two) times daily with a meal. 04/15/23 08/03/23  Shon Hale, MD  cloNIDine (CATAPRES) 0.1 MG tablet Take 1 tablet (0.1 mg total) by mouth 3 (three) times daily. 07/01/23   Vassie Loll, MD  cyclobenzaprine (FLEXERIL) 5 MG tablet Take 5 mg by mouth 3 (three) times daily as needed for muscle spasms. 04/11/23   [provider]  furosemide (LASIX) 80  MG tablet Take 80 mg by mouth 4 (four) times a week. 06/13/23   [provider]  levETIRAcetam (KEPPRA) 500 MG tablet Take 2 tablets (1,000 mg total) by mouth every evening. Take an additional tablet on Monday,Wednesday and Friday evening after Dialysis 04/15/23   Shon Hale, MD  lidocaine-prilocaine (EMLA) cream Apply 1 Application topically 3 (three) times a week. 06/04/22   [provider]  pantoprazole (PROTONIX) 40 MG tablet Take 1 tablet (40 mg total) by mouth daily. 05/24/23   Sherryll Burger, Pratik D, DO  predniSONE (DELTASONE) 20 MG tablet Take 2 tablets by mouth daily x 2 days; then 1 tablet by mouth daily x 3 days; then half tablet by mouth daily x 3 days and stop prednisone. 07/02/23   Vassie Loll, MD  senna-docusate (SENOKOT-S) 8.6-50 MG tablet Take 2 tablets by mouth at bedtime. 11/12/22   Shon Hale, MD  sevelamer carbonate (RENVELA) 800 MG tablet Take 800 mg by mouth 3 (three) times daily. 12/11/22   [provider]      Allergies    Benadryl [diphenhydramine hcl (sleep)], Daptomycin, Linezolid, Moxifloxacin, Quinine derivatives, Vancomycin, Azithromycin, Tetracycline, and Zosyn [piperacillin sod-tazobactam so]    Review of Systems   Review of Systems  Respiratory:  Positive for shortness of breath.   All other systems reviewed and are negative.   Physical Exam Updated Vital Signs BP (!) 187/91 (BP Location: Right Arm)   Pulse (!) 106   Temp 98.4 F (36.9 C) (Oral)  Resp (!) 22   Ht 5\' 2"  (1.575 m)   Wt 42.5 kg   LMP  (LMP Unknown)   SpO2 93%   BMI 17.14 kg/m  Physical Exam Vitals and nursing note reviewed.   58 year old female, resting comfortably and in no acute distress. Vital signs are significant for slightly elevated respiratory rate, heart rate and moderate to severely elevated blood pressure. Oxygen saturation is 93%, which is normal. Head is normocephalic and atraumatic. PERRLA, EOMI. Oropharynx is clear. Neck is nontender and  supple without adenopathy.  JVD is present at 90 degrees. Back has no presacral edema. Lungs are clear without rales, wheezes, or rhonchi. Chest is nontender. Heart has regular rate and rhythm without murmur. Abdomen is soft, flat, nontender. Extremities have no edema. Skin is warm and dry without rash. Neurologic: Mental status is normal, cranial nerves are intact, paraplegia present.  ED Results / Procedures / Treatments   Labs (all labs ordered are listed, but only abnormal results are displayed) Labs Reviewed - No data to display  EKG EKG Interpretation Date/Time:  Thursday July 10 2023 05:30:59 EST Ventricular Rate:  111 PR Interval:  131 QRS Duration:  109 QT Interval:  353 QTC Calculation: 480 R Axis:   -70  Text Interpretation: Sinus tachycardia Probable left atrial enlargement Incomplete RBBB and LAFB Consider right ventricular hypertrophy Consider anterior infarct When compared with ECG of 06/28/2023, No significant change was found Confirmed by Dione Booze ([id number]) on 07/10/2023 5:39:45 AM  Radiology No results found.  Procedures Procedures  Cardiac monitor shows normal sinus rhythm, per my interpretation.  Medications Ordered in ED Medications - No data to display  ED Course/ Medical Decision Making/ A&P                                 Medical Decision Making Amount and/or Complexity of Data Reviewed Labs: ordered. Radiology: ordered.  Risk Prescription drug management.   Hypoxia, hypertension, headache and patient with known history of hypertension on dialysis.  Although she does not have overt rales, she does have neck vein distention and I feel she is still fluid overloaded.  I suspect this is the main reason for shortness of breath.  Although blood pressure is elevated, I do not feel this is a hypertensive crisis.  I have ordered chest x-ray, CBC, basic metabolic panel.  She is maintaining adequate oxygen saturation on 3 L of oxygen.  I reviewed her  past records, and she had been admitted 06/28/2023-07/01/2023 for RSV infection with acute respiratory failure and hypoxemia, also admitted 06/10/2023 and discharged 06/11/2023 for hyperkalemia necessitating dialysis.  I have reviewed her laboratory tests, and my interpretation is mild hyponatremia which is not felt to be clinically significant, marked leukocytosis of uncertain significance.  Chest x-ray shows interstitial edema which matches her clinical presentation.  Have independently reviewed her image, and agree with radiologist interpretation.  I believe she will need to be dialyzed today even though she had received dialysis yesterday.  I have discussed case with Dr. Valentino Nose of nephrology service who states he will come to evaluate the patient.  Final Clinical Impression(s) / ED Diagnoses Final diagnoses:  Acute pulmonary edema (HCC)  End-stage renal disease on hemodialysis (HCC)  Paraplegia (HCC)  Hyponatremia    Rx / DC Orders ED Discharge Orders     None         Dione Booze, MD 07/10/23 [phone]

## 2023-07-10 NOTE — Progress Notes (Addendum)
Nephrology Follow-Up Consult note   Assessment/Recommendations: [name] is a/an 58 y.o. female with a past medical history significant for ESRD, admitted for influenza c/b acute hypoxic respiratory failure.     Outpatient HD orders:  [facility]  MWF  No heparin  3 hours Noncompliant Frequently signs off or misses EDW 43 kg  BF 400 DF 500 3K/2.5 calcium  Calcitriol 1 mcg each tx Mircera 175 mcg every 2 weeks  Left AVF    Acute hypoxic respiratory failure: 2/2 volume overload and possible pneumonia. UF as below.   ESRD -  Plan for HD today as well as tomorrow to get back on MWF schedule.   Hypertension/volume  - markedly elevated.  Non-compliant outpatient. Aggressive UF with HD. Cont home BP meds  Anemia  - hgb at goal. No esa Possible pneumonia/Leukocytosis: seems like pneumonia based on appearance and leukocytosis. work up and mgmt per primary team  Metabolic bone disease -   continue with home meds  Nutrition -  renal diet  Disposition - possible discharge to home today if she can have home O2 arranged.    Recommendations conveyed to primary service.    [name] [facility] 07/10/2023 9:29 AM  ___________________________________________________________  CC: shortness of breath  Interval History/Subjective: Patient recently seen in the hospital for RSV.  Came back with shortness of breath.  Although she is oriented to person place and time she does seem somewhat altered and has a hard time answering some questions.  When asked why she came to the hospital as she said "the other doctor told me to."  She continues to reiterate a similar statement when asked this question.  She denied shortness of breath to me but did express shortness of breath as well as headache to physicians earlier.  In the emergency department she was noted to be hypertensive.  It is unclear whether she has been compliant with her dialysis.  She did have some  hypoxia in the emergency department that improved with supplemental oxygen   Medications:  Current Facility-Administered Medications  Medication Dose Route Frequency Provider Last Rate Last Admin   cefTRIAXone (ROCEPHIN) 2 g in sodium chloride 0.9 % 100 mL IVPB  2 g Intravenous Once Kommor, Madison, MD       Chlorhexidine Gluconate Cloth 2 % PADS 6 each  6 each Topical Q0600 [name], MD       pentafluoroprop-tetrafluoroeth Peggye Pitt) aerosol 1 Application  1 Application Topical PRN [name], MD       Current Outpatient Medications  Medication Sig Dispense Refill   acetaminophen (TYLENOL) 500 MG tablet Take 1,000 mg by mouth every 6 (six) hours as needed for moderate pain.     albuterol (PROVENTIL) (2.5 MG/3ML) 0.083% nebulizer solution Take 3 mLs (2.5 mg total) by nebulization every 4 (four) hours as needed for wheezing or shortness of breath. 75 mL 2   amLODipine (NORVASC) 5 MG tablet Take 1 tablet (5 mg total) by mouth daily. Take 1 tablet on Monday,Wednesday and Friday then take 2 tablets on Tuesday,Thursday,Saturday and Sunday. 90 tablet 3   carvedilol (COREG) 12.5 MG tablet Take 1 tablet (12.5 mg total) by mouth 2 (two) times daily with a meal. 180 tablet 3   cloNIDine (CATAPRES) 0.1 MG tablet Take 1 tablet (0.1 mg total) by mouth 3 (three) times daily.     cyclobenzaprine (FLEXERIL) 5 MG tablet Take 5 mg by mouth 3 (three) times daily as needed for muscle  spasms.     furosemide (LASIX) 80 MG tablet Take 80 mg by mouth 4 (four) times a week.     levETIRAcetam (KEPPRA) 500 MG tablet Take 2 tablets (1,000 mg total) by mouth every evening. Take an additional tablet on Monday,Wednesday and Friday evening after Dialysis 90 tablet 3   lidocaine-prilocaine (EMLA) cream Apply 1 Application topically 3 (three) times a week.     pantoprazole (PROTONIX) 40 MG tablet Take 1 tablet (40 mg total) by mouth daily. 30 tablet 0   predniSONE (DELTASONE) 20 MG tablet Take 2 tablets by mouth  daily x 2 days; then 1 tablet by mouth daily x 3 days; then half tablet by mouth daily x 3 days and stop prednisone. 10 tablet 0   senna-docusate (SENOKOT-S) 8.6-50 MG tablet Take 2 tablets by mouth at bedtime. 60 tablet 3   sevelamer carbonate (RENVELA) 800 MG tablet Take 800 mg by mouth 3 (three) times daily.        Review of Systems: 10 systems reviewed and negative except per interval history/subjective  Physical Exam: Vitals:   07/10/23 0700 07/10/23 0900  BP: (!) 176/90 (!) 162/84  Pulse: (!) 104 96  Resp: 18 19  Temp:    SpO2: 96% 96%   No intake/output data recorded. No intake or output data in the 24 hours ending 07/10/23 1308 Constitutional: Ill-appearing, lying in bed, no distress ENMT: ears and nose without scars or lesions, MMM CV: normal rate, no edema Respiratory: Coarse bilateral sounds, no increased work of breathing Gastrointestinal: soft, non-tender, no palpable masses or hernias Skin: no visible lesions or rashes Psych: Awake and alert, oriented to person place and time, judgment and insight seem impaired   Test Results I personally reviewed new and old clinical labs and radiology tests Lab Results  Component Value Date   NA 134 (L) 07/10/2023   K 4.4 07/10/2023   CL 100 07/10/2023   CO2 23 07/10/2023   BUN 42 (H) 07/10/2023   CREATININE 3.66 (H) 07/10/2023   CALCIUM 8.7 (L) 07/10/2023   ALBUMIN 3.0 (L) 06/29/2023   PHOS 5.6 (H) 06/29/2023    CBC Recent Labs  Lab 07/10/23 0549  WBC 29.2*  NEUTROABS 26.9*  HGB 12.2  HCT 39.7  MCV 91.5  PLT 149*

## 2023-07-10 NOTE — ED Provider Notes (Signed)
  Physical Exam  BP (!) 176/90   Pulse (!) 104   Temp 98.4 F (36.9 C) (Oral)   Resp 18   Ht 5\' 2"  (1.575 m)   Wt 42.5 kg   LMP  (LMP Unknown)   SpO2 96%   BMI 17.14 kg/m   Physical Exam Vitals and nursing note reviewed.  Constitutional:      General: She is not in acute distress.    Appearance: She is well-developed.  HENT:     Head: Normocephalic and atraumatic.  Eyes:     Conjunctiva/sclera: Conjunctivae normal.  Cardiovascular:     Rate and Rhythm: Normal rate and regular rhythm.     Heart sounds: No murmur heard. Pulmonary:     Effort: Pulmonary effort is normal. No respiratory distress.     Breath sounds: Rales present.  Abdominal:     Palpations: Abdomen is soft.     Tenderness: There is no abdominal tenderness.  Musculoskeletal:        General: No swelling.     Cervical back: Neck supple.  Skin:    General: Skin is warm and dry.     Capillary Refill: Capillary refill takes less than 2 seconds.  Neurological:     Mental Status: She is alert.  Psychiatric:        Mood and Affect: Mood normal.     Procedures  Procedures  ED Course / MDM    Medical Decision Making Amount and/or Complexity of Data Reviewed Labs: ordered. Radiology: ordered.  Risk Prescription drug management. Decision regarding hospitalization.   Patient received in handoff.  Respiratory failure with new 3 L oxygen requirement.  Fluid overloaded requiring emergency dialysis today.  New significant leukocytosis to 29.2 but may be secondary to steroid use during previous hospital admission.  Follow-up chest CT performed to rule out pneumonia which shows a near complete atelectasis of the right lower lobe and left lower lobe, cardiomegaly and pulmonary arterial hypertension with anasarca.  Previous provider spoke with Dr. Melanee Spry who will dialyze patient today.  Will require hospital admission for respiratory failure and new oxygen requirement       Glendora Score, MD 07/10/23  [phone]

## 2023-07-10 NOTE — ED Notes (Signed)
Bag lunch given to pt

## 2023-07-10 NOTE — ED Notes (Signed)
Pt refuses rectal temp and also refuses to sit up in trying to have a productive cough.

## 2023-07-11 DIAGNOSIS — J189 Pneumonia, unspecified organism: Secondary | ICD-10-CM | POA: Diagnosis not present

## 2023-07-11 LAB — COMPREHENSIVE METABOLIC PANEL
ALT: 14 U/L (ref 0–44)
AST: 15 U/L (ref 15–41)
Albumin: 3 g/dL — ABNORMAL LOW (ref 3.5–5.0)
Alkaline Phosphatase: 79 U/L (ref 38–126)
Anion gap: 11 (ref 5–15)
BUN: 31 mg/dL — ABNORMAL HIGH (ref 6–20)
CO2: 30 mmol/L (ref 22–32)
Calcium: 8.5 mg/dL — ABNORMAL LOW (ref 8.9–10.3)
Chloride: 93 mmol/L — ABNORMAL LOW (ref 98–111)
Creatinine, Ser: 2.73 mg/dL — ABNORMAL HIGH (ref 0.44–1.00)
GFR, Estimated: 20 mL/min — ABNORMAL LOW (ref >60)
Glucose, Bld: 100 mg/dL — ABNORMAL HIGH (ref 70–99)
Potassium: 4.2 mmol/L (ref 3.5–5.1)
Sodium: 134 mmol/L — ABNORMAL LOW (ref 135–145)
Total Bilirubin: 0.8 mg/dL (ref 0.0–1.2)
Total Protein: 6.9 g/dL (ref 6.5–8.1)

## 2023-07-11 LAB — HEPATITIS B SURFACE ANTIBODY, QUANTITATIVE: Hep B S AB Quant (Post): 369 m[IU]/mL

## 2023-07-11 LAB — CBC
HCT: 37.9 % (ref 36.0–46.0)
Hemoglobin: 11.4 g/dL — ABNORMAL LOW (ref 12.0–15.0)
MCH: 27.8 pg (ref 26.0–34.0)
MCHC: 30.1 g/dL (ref 30.0–36.0)
MCV: 92.4 fL (ref 80.0–100.0)
Platelets: 129 10*3/uL — ABNORMAL LOW (ref 150–400)
RBC: 4.1 MIL/uL (ref 3.87–5.11)
RDW: 20.6 % — ABNORMAL HIGH (ref 11.5–15.5)
WBC: 16 10*3/uL — ABNORMAL HIGH (ref 4.0–10.5)
nRBC: 0 % (ref 0.0–0.2)

## 2023-07-11 MED ORDER — NEPRO/CARBSTEADY PO LIQD
237.0000 mL | Freq: Two times a day (BID) | ORAL | Status: DC
  Administered 2023-07-12 – 2023-07-14 (×6): 237 mL via ORAL

## 2023-07-11 MED ORDER — LEVETIRACETAM 500 MG PO TABS
500.0000 mg | ORAL_TABLET | ORAL | Status: DC
  Administered 2023-07-14: 500 mg via ORAL
  Filled 2023-07-11 (×2): qty 1
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
## 2023-07-11 NOTE — ED Notes (Signed)
ED TO INPATIENT HANDOFF REPORT  ED Nurse Name and Phone #: Adelina Mings [phone]  S Name/Age/Gender [name] 58 y.o. female Room/Bed: APA11/APA11  Code Status   Code Status: Full Code  Home/SNF/Other Home Patient oriented to: self, place, time, and situation Is this baseline? Yes   Triage Complete: Triage complete  Chief Complaint Paraplegia (HCC) [G82.20] Hyponatremia [E87.1] Acute pulmonary edema (HCC) [J81.0] End-stage renal disease on hemodialysis (HCC) [N18.6, Z99.2] PNA (pneumonia) [J18.9]  Triage Note [facility] from home. Cc of SOB from the flu. 88% while doing home neb. +HTN.  220/140 initial  with ems 190/90 now.  EMS placed her on 10l NRB. Rec'd breathing tx en route. Dx with flu last week.  Had dialysis yesterday (2/12) and has felt bad since.    Allergies Allergies  Allergen Reactions   Benadryl [Diphenhydramine Hcl (Sleep)] Hives   Daptomycin Hives   Linezolid Other (See Comments)    Patient self-discontinued treatment due to GI intolerance. Taking it along with moxifloxacin   Moxifloxacin Other (See Comments)    Patient self-discontinued treatment due to GI intolerance. Taking it along with linezolid   Quinine Derivatives Other (See Comments)    Alters mental status   Vancomycin Other (See Comments)    Pt is tolerating this medication at HD   Azithromycin Itching and Rash   Tetracycline Itching    Able to tolerate Doxycycline.    Zosyn [Piperacillin Sod-Tazobactam So] Rash    Level of Care/Admitting Diagnosis ED Disposition     ED Disposition  Admit   Condition  --   Comment  Hospital Area: [facility] [[id number]]  Level of Care: Telemetry [5]  Covid Evaluation: Confirmed COVID Negative  Diagnosis: PNA (pneumonia) [[id number]]  Admitting Physician: Rhetta Mura [[id number]]  Attending Physician: Rhetta Mura [phone]  Certification:: I certify this patient will need inpatient services for at least 2 midnights  Expected Medical  Readiness: 07/12/2023          B Medical/Surgery History Past Medical History:  Diagnosis Date   Abnormal uterine bleeding (AUB) 06/15/2014   Cancer (HCC)    uterine   High blood pressure    Paraplegia (lower)    Renal disorder    Seizure disorder (HCC)    Seizures (HCC)    Suprapubic catheter (HCC)    Urinary tract infection    Past Surgical History:  Procedure Laterality Date   APPLICATION OF WOUND VAC Right 09/13/2021   (approximately 1-75mos ago) pressure sore on right hip   BACK SURGERY     Pt stated "before 2000"   BIOPSY  12/03/2022   Procedure: BIOPSY;  Surgeon: Dolores Frame, MD;  [facility];  Service: Gastroenterology;;   ESOPHAGOGASTRODUODENOSCOPY N/A 09/20/2015   Procedure: ESOPHAGOGASTRODUODENOSCOPY (EGD);  Surgeon: Malissa Hippo, MD;  Location[facility];  Service: Endoscopy;  Laterality: N/A;  730   ESOPHAGOGASTRODUODENOSCOPY (EGD) WITH PROPOFOL N/A 12/03/2022   Procedure: ESOPHAGOGASTRODUODENOSCOPY (EGD) WITH PROPOFOL;  Surgeon: Dolores Frame, MD;  [facility];  Service: Gastroenterology;  Laterality: N/A;  1:15 pm, asa 3, pt knows to arrive at 10:30  dialysis pt, M,W & F   IR CATHETER TUBE CHANGE  04/02/2018   PERCUTANEOUS ENDOSCOPIC GASTROSTOMY (PEG) REMOVAL N/A 09/20/2015   Procedure: PERCUTANEOUS ENDOSCOPIC GASTROSTOMY (PEG) REMOVAL;  Surgeon: Malissa Hippo, MD;  [facility];  Service: Endoscopy;  Laterality: N/A;   TEE WITHOUT CARDIOVERSION N/A 11/11/2022   Procedure: TRANSESOPHAGEAL ECHOCARDIOGRAM (TEE);  Surgeon: Dietrich Pates  V, MD;  Location: [facility];  Service: Cardiovascular;  Laterality: N/A;     A IV Location/Drains/Wounds Patient Lines/Drains/Airways Status     Active Line/Drains/Airways     Name Placement date Placement time Site Days   Peripheral IV 07/10/23 20 G 1" Anterior;Right Forearm 07/10/23  0543  Forearm  1   Fistula / Graft Left Upper arm Arteriovenous fistula --  --   Upper arm  --   Suprapubic Catheter Latex --  --  Latex  --   Pressure Injury 02/26/22 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 02/26/22  0120  -- 500   Pressure Injury 02/26/22 Ischial tuberosity Right Stage 4 - Full thickness tissue loss with exposed bone, tendon or muscle. 02/26/22  0120  -- 500   Pressure Injury 06/29/23 Ischial tuberosity Right Stage 4 - Full thickness tissue loss with exposed bone, tendon or muscle. 3 cm x 1 cm x 1 cm with 1 cm undermining at 12 o'clock 100% clean 06/29/23  0930  -- 12   Pressure Injury 06/29/23 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 2.5 cm x 2 cm x 0.5 cm with 2 cm undermining at 7 o'clock 100% clean 06/29/23  --  -- 12   Wound / Incision (Open or Dehisced) 07/24/22 Other (Comment) Ischial tuberosity Left Stage IV 2x2 cm with 1.5cm depth small amount of serous exudate 07/24/22  2239  Ischial tuberosity  352            Intake/Output Last 24 hours  Intake/Output Summary (Last 24 hours) at 07/11/2023 1446 Last data filed at 07/11/2023 0445 Gross per 24 hour  Intake 400.33 ml  Output 4150 ml  Net -3749.67 ml    Labs/Imaging Results for orders placed or performed during the hospital encounter of 07/10/23 (from the past 48 hours)  Basic metabolic panel     Status: Abnormal   Collection Time: 07/10/23  5:49 AM  Result Value Ref Range   Sodium 134 (L) 135 - 145 mmol/L   Potassium 4.4 3.5 - 5.1 mmol/L   Chloride 100 98 - 111 mmol/L   CO2 23 22 - 32 mmol/L   Glucose, Bld 92 70 - 99 mg/dL    Comment: Glucose reference range applies only to samples taken after fasting for at least 8 hours.   BUN 42 (H) 6 - 20 mg/dL   Creatinine, Ser 1.61 (H) 0.44 - 1.00 mg/dL   Calcium 8.7 (L) 8.9 - 10.3 mg/dL   GFR, Estimated 14 (L) >60 mL/min    Comment: (NOTE) Calculated using the CKD-EPI Creatinine Equation (2021)    Anion gap 11 5 - 15     Comment: Performed at [facility], [address] [place], [place] [address]  CBC with Differential     Status: Abnormal   Collection Time: 07/10/23  5:49 AM  Result Value Ref Range   WBC 29.2 (H) 4.0 - 10.5 K/uL   RBC 4.34 3.87 - 5.11 MIL/uL   Hemoglobin 12.2 12.0 - 15.0 g/dL   HCT 54.0 98.1 - 19.1 %   MCV 91.5 80.0 - 100.0 fL   MCH 28.1 26.0 - 34.0 pg   MCHC 30.7 30.0 - 36.0 g/dL   RDW 47.8 (H) 29.5 - 62.1 %   Platelets 149 (L) 150 - 400 K/uL   nRBC 0.0 0.0 - 0.2 %   Neutrophils Relative % 87 %  Neutro Abs 26.9 (H) 1.7 - 7.7 K/uL   Band Neutrophils 5 %   Lymphocytes Relative 4 %   Lymphs Abs 1.2 0.7 - 4.0 K/uL   Monocytes Relative 3 %   Monocytes Absolute 0.9 0.1 - 1.0 K/uL   Eosinophils Relative 0 %   Eosinophils Absolute 0.0 0.0 - 0.5 K/uL   Basophils Relative 1 %   Basophils Absolute 0.3 (H) 0.0 - 0.1 K/uL   WBC Morphology MORPHOLOGY UNREMARKABLE    RBC Morphology MORPHOLOGY UNREMARKABLE    Smear Review PLATELETS APPEAR DECREASED     Comment: PLATELET COUNT CONFIRMED BY SMEAR   nRBC 1 (H) 0 /100 WBC   Abs Immature Granulocytes 0.00 0.00 - 0.07 K/uL    Comment: Performed at [facility], [address], [place], [place] [address]  Resp panel by RT-PCR (RSV, Flu A&B, Covid) Anterior Nasal Swab     Status: None   Collection Time: 07/10/23  7:30 AM   Specimen: Anterior Nasal Swab  Result Value Ref Range   SARS Coronavirus 2 by RT PCR NEGATIVE NEGATIVE    Comment: (NOTE) SARS-CoV-2 target nucleic acids are NOT DETECTED.  The SARS-CoV-2 RNA is generally detectable in upper respiratory specimens during the acute phase of infection. The lowest concentration of SARS-CoV-2 viral copies this assay can detect is 138 copies/mL. A negative result does not preclude SARS-Cov-2 infection and should not be used as the sole basis for treatment or other patient management decisions. A negative result may occur with  improper specimen collection/handling, submission of specimen  other than nasopharyngeal swab, presence of viral mutation(s) within the areas targeted by this assay, and inadequate number of viral copies(<138 copies/mL). A negative result must be combined with clinical observations, patient history, and epidemiological information. The expected result is Negative.  Fact Sheet for Patients:  BloggerCourse.com  Fact Sheet for Healthcare Providers:  SeriousBroker.it  This test is no t yet approved or cleared by the [place] FDA and  has been authorized for detection and/or diagnosis of SARS-CoV-2 by FDA under an Emergency Use Authorization (EUA). This EUA will remain  in effect (meaning this test can be used) for the duration of the COVID-19 declaration under Section 564(b)(1) of the Act, 21 U.S.C.section 360bbb-3(b)(1), unless the authorization is terminated  or revoked sooner.       Influenza A by PCR NEGATIVE NEGATIVE   Influenza B by PCR NEGATIVE NEGATIVE    Comment: (NOTE) The Xpert Xpress SARS-CoV-2/FLU/RSV plus assay is intended as an aid in the diagnosis of influenza from Nasopharyngeal swab specimens and should not be used as a sole basis for treatment. Nasal washings and aspirates are unacceptable for Xpert Xpress SARS-CoV-2/FLU/RSV testing.  Fact Sheet for Patients: BloggerCourse.com  Fact Sheet for Healthcare Providers: SeriousBroker.it  This test is not yet approved or cleared by the [place] FDA and has been authorized for detection and/or diagnosis of SARS-CoV-2 by FDA under an Emergency Use Authorization (EUA). This EUA will remain in effect (meaning this test can be used) for the duration of the COVID-19 declaration under Section 564(b)(1) of the Act, 21 U.S.C. section 360bbb-3(b)(1), unless the authorization is terminated or revoked.     Resp Syncytial Virus by PCR NEGATIVE NEGATIVE    Comment: (NOTE) Fact  Sheet for Patients: BloggerCourse.com  Fact Sheet for Healthcare Providers: SeriousBroker.it  This test is not yet approved or cleared by the [place] FDA and has been authorized for detection and/or diagnosis of SARS-CoV-2 by FDA under an  Emergency Use Authorization (EUA). This EUA will remain in effect (meaning this test can be used) for the duration of the COVID-19 declaration under Section 564(b)(1) of the Act, 21 U.S.C. section 360bbb-3(b)(1), unless the authorization is terminated or revoked.  Performed at [facility], [address], [place], [place] [address]   Hepatitis B surface antigen     Status: None   Collection Time: 07/10/23  8:17 AM  Result Value Ref Range   Hepatitis B Surface Ag NON REACTIVE NON REACTIVE    Comment: Performed at [facility], [address], [place], [place] [address]  Hepatitis B surface antibody,quantitative     Status: None   Collection Time: 07/10/23  8:17 AM  Result Value Ref Range   Hep B S AB Quant (Post) 369.0 Immunity>10 mIU/mL    Comment: (NOTE)  Status of Immunity                     Anti-HBs Level  ------------------                     -------------- Inconsistent with Immunity                  0.0 - 10.0 Consistent with Immunity                         >10.0 Performed At: [facility] [address] [place], [place] [id number] Jolene Schimke MD LK:[id number]   Procalcitonin     Status: None   Collection Time: 07/10/23  9:38 AM  Result Value Ref Range   Procalcitonin 5.21 ng/mL    Comment:        Interpretation: PCT > 2 ng/mL: Systemic infection (sepsis) is likely, unless other causes are known. (NOTE)       Sepsis PCT Algorithm           Lower Respiratory Tract                                      Infection PCT Algorithm    ----------------------------     ----------------------------         PCT < 0.25 ng/mL                PCT < 0.10 ng/mL           Strongly encourage             Strongly discourage   discontinuation of antibiotics    initiation of antibiotics    ----------------------------     -----------------------------       PCT 0.25 - 0.50 ng/mL            PCT 0.10 - 0.25 ng/mL               OR       >80% decrease in PCT            Discourage initiation of                                            antibiotics      Encourage discontinuation           of antibiotics    ----------------------------     -----------------------------  PCT >= 0.50 ng/mL              PCT 0.26 - 0.50 ng/mL               AND       <80% decrease in PCT              Encourage initiation of                                             antibiotics       Encourage continuation           of antibiotics    ----------------------------     -----------------------------        PCT >= 0.50 ng/mL                  PCT > 0.50 ng/mL               AND         increase in PCT                  Strongly encourage                                      initiation of antibiotics    Strongly encourage escalation           of antibiotics                                     -----------------------------                                           PCT <= 0.25 ng/mL                                                 OR                                        > 80% decrease in PCT                                      Discontinue / Do not initiate                                             antibiotics  Performed at [facility], [address], [place], [place] [address]   Culture, blood (Routine X 2) w Reflex to ID Panel     Status: None (Preliminary result)   Collection Time: 07/10/23  9:38 AM   Specimen: BLOOD  Result Value Ref Range   Specimen Description BLOOD RIGHT ANTECUBITAL    Special Requests  BOTTLES DRAWN AEROBIC AND ANAEROBIC Blood Culture adequate volume   Culture      NO GROWTH < 24 HOURS Performed at [facility], [address] [place],  [place] [address]    Report Status PENDING   Culture, blood (Routine X 2) w Reflex to ID Panel     Status: None (Preliminary result)   Collection Time: 07/10/23  9:49 AM   Specimen: BLOOD  Result Value Ref Range   Specimen Description BLOOD BLOOD RIGHT HAND    Special Requests      BOTTLES DRAWN AEROBIC AND ANAEROBIC Blood Culture adequate volume   Culture      NO GROWTH < 24 HOURS Performed at [facility], [address] [place], [place] [address]    Report Status PENDING   CBC     Status: Abnormal   Collection Time: 07/11/23  4:04 AM  Result Value Ref Range   WBC 16.0 (H) 4.0 - 10.5 K/uL   RBC 4.10 3.87 - 5.11 MIL/uL   Hemoglobin 11.4 (L) 12.0 - 15.0 g/dL   HCT 09.8 11.9 - 14.7 %   MCV 92.4 80.0 - 100.0 fL   MCH 27.8 26.0 - 34.0 pg   MCHC 30.1 30.0 - 36.0 g/dL   RDW 82.9 (H) 56.2 - 13.0 %   Platelets 129 (L) 150 - 400 K/uL   nRBC 0.0 0.0 - 0.2 %    Comment: Performed at [facility], [address], [place], [place] [address]  Comprehensive metabolic panel     Status: Abnormal   Collection Time: 07/11/23  4:04 AM  Result Value Ref Range   Sodium 134 (L) 135 - 145 mmol/L   Potassium 4.2 3.5 - 5.1 mmol/L   Chloride 93 (L) 98 - 111 mmol/L   CO2 30 22 - 32 mmol/L   Glucose, Bld 100 (H) 70 - 99 mg/dL    Comment: Glucose reference range applies only to samples taken after fasting for at least 8 hours.   BUN 31 (H) 6 - 20 mg/dL   Creatinine, Ser 4.69 (H) 0.44 - 1.00 mg/dL   Calcium 8.5 (L) 8.9 - 10.3 mg/dL   Total Protein 6.9 6.5 - 8.1 g/dL   Albumin 3.0 (L) 3.5 - 5.0 g/dL   AST 15 15 - 41 U/L   ALT 14 0 - 44 U/L   Alkaline Phosphatase 79 38 - 126 U/L   Total Bilirubin 0.8 0.0 - 1.2 mg/dL   GFR, Estimated 20 (L) >60 mL/min    Comment: (NOTE) Calculated using the CKD-EPI Creatinine Equation (2021)    Anion gap 11 5 - 15    Comment: Performed at [facility], [address] [place], [place] [address]   CT Chest W Contrast Result Date: 07/10/2023 CLINICAL DATA:  Pneumonia,  complication suspected, xray done EXAM: CT CHEST WITH CONTRAST TECHNIQUE: Multidetector CT imaging of the chest was performed during intravenous contrast administration. RADIATION DOSE REDUCTION: This exam was performed according to the departmental dose-optimization program which includes automated exposure control, adjustment of the mA and/or kV according to patient size and/or use of iterative reconstruction technique. CONTRAST:  80mL OMNIPAQUE IOHEXOL 300 MG/ML  SOLN COMPARISON:  CT 05/20/2023 FINDINGS: Cardiovascular: Moderately severe cardiomegaly. Small pericardial effusion. Thoracic aorta is nonaneurysmal. Central pulmonary vasculature is dilated. Mediastinum/Nodes: No enlarged mediastinal, hilar, or axillary lymph nodes. Thyroid gland, trachea, and esophagus demonstrate no significant findings. Lungs/Pleura: Near-complete atelectasis or consolidation of the right lower lobe and left lower lobe. Small right pleural  effusion. Upper lung fields are clear. No pneumothorax. Upper Abdomen: No acute abnormality. Musculoskeletal: Chronic compression deformity of the T7 vertebral body with unchanged anterolisthesis and left lateral listhesis at T7-8. Prior thoracic fusion. Chronic healed sternal fracture. No new fractures. Anasarca. IMPRESSION: 1. Near-complete atelectasis or consolidation of the right lower lobe and left lower lobe. 2. Small right pleural effusion. 3. Moderately severe cardiomegaly with small pericardial effusion. 4. Dilated central pulmonary vasculature, suggesting pulmonary arterial hypertension. 5. Anasarca. Electronically Signed   By: Duanne Guess D.O.   On: 07/10/2023 08:37   DG Chest Port 1 View Result Date: 07/10/2023 CLINICAL DATA:  Shortness of breath EXAM: PORTABLE CHEST 1 VIEW COMPARISON:  06/28/2023 FINDINGS: Chronic cardiomegaly. Diffuse interstitial opacity with small right pleural effusion. Dense opacity behind the heart least partially related to cardiomegaly. Extensive  thoracic fusion hardware. Chronic nonunited right clavicle fracture. IMPRESSION: 1. Interstitial edema and small right pleural effusion. 2. Retrocardiac opacification which could be pulmonary or from cardiomegaly, similar to prior. Electronically Signed   By: Tiburcio Pea M.D.   On: 07/10/2023 06:35    Pending Labs Unresulted Labs (From admission, onward)     Start     Ordered   07/10/23 0920  Expectorated Sputum Assessment w Gram Stain, Rflx to Resp Cult  Once,   R       Question:  Patient immune status  Answer:  Immunocompromised   07/10/23 0919            Vitals/Pain Today's Vitals   07/11/23 1100 07/11/23 1110 07/11/23 1200 07/11/23 1201  BP: (!) 159/84  (!) 190/96 (!) 190/96  Pulse: 80 82 82   Resp:  19 18   Temp:      TempSrc:      SpO2:  90% 90%   Weight:      Height:      PainSc:        Isolation Precautions No active isolations  Medications Medications  Chlorhexidine Gluconate Cloth 2 % PADS 6 each (6 each Topical Not Given 07/11/23 0904)  pentafluoroprop-tetrafluoroeth (GEBAUERS) aerosol 1 Application (has no administration in time range)  calcitRIOL (ROCALTROL) capsule 1 mcg (1 mcg Oral Given 07/11/23 1408)  cloNIDine (CATAPRES) tablet 0.1 mg (0.1 mg Oral Given 07/11/23 1201)  furosemide (LASIX) tablet 80 mg (80 mg Oral Given 07/10/23 1029)  acetaminophen (TYLENOL) tablet 1,000 mg (650 mg Oral Given 07/10/23 1524)  pantoprazole (PROTONIX) EC tablet 40 mg (40 mg Oral Given 07/11/23 1201)  senna-docusate (Senokot-S) tablet 2 tablet (2 tablets Oral Given 07/10/23 2148)  sevelamer carbonate (RENVELA) tablet 800 mg (800 mg Oral Given 07/11/23 1201)  cyclobenzaprine (FLEXERIL) tablet 5 mg (has no administration in time range)  levETIRAcetam (KEPPRA) tablet 1,000 mg (1,000 mg Oral Given 07/10/23 1854)  lidocaine-prilocaine (EMLA) cream 1 Application (1 Application Topical Not Given 07/11/23 1100)  cefTRIAXone (ROCEPHIN) 2 g in sodium chloride 0.9 % 100 mL IVPB (2 g  Intravenous New Bag/Given 07/11/23 1206)  azithromycin (ZITHROMAX) 500 mg in sodium chloride 0.9 % 250 mL IVPB (500 mg Intravenous New Bag/Given 07/11/23 1408)  heparin injection 5,000 Units (5,000 Units Subcutaneous Given 07/11/23 0847)  albuterol (PROVENTIL) (2.5 MG/3ML) 0.083% nebulizer solution 2.5 mg (has no administration in time range)  metoprolol tartrate (LOPRESSOR) injection 5 mg (has no administration in time range)  levETIRAcetam (KEPPRA) tablet 500 mg (has no administration in time range)  vancomycin (VANCOREADY) IVPB 500 mg/100 mL (500 mg Intravenous Not Given 07/11/23 1423)  prochlorperazine (COMPAZINE) injection 10  mg (10 mg Intravenous Given 07/10/23 0601)  iohexol (OMNIPAQUE) 300 MG/ML solution 80 mL (80 mLs Intravenous Contrast Given 07/10/23 0740)  cefTRIAXone (ROCEPHIN) 2 g in sodium chloride 0.9 % 100 mL IVPB (0 g Intravenous Stopped 07/10/23 1039)  vancomycin (VANCOCIN) IVPB 1000 mg/200 mL premix (0 mg Intravenous Stopped 07/10/23 1825)    Mobility wheelchair     Focused Assessments Cardiac Assessment Handoff:  Cardiac Rhythm: Normal sinus rhythm Lab Results  Component Value Date   CKTOTAL 185 (H) 05/21/2013   TROPONINI <0.03 06/22/2016   No results found for: "DDIMER" Does the Patient currently have chest pain? No   , Neuro Assessment Handoff:  Swallow screen pass? Yes  Cardiac Rhythm: Normal sinus rhythm       Neuro Assessment: Within Defined Limits     , Renal Assessment Handoff:  Hemodialysis Schedule: Hemodialysis Schedule: Monday/Wednesday/Friday Last Hemodialysis date and time: 07/10/2023   Restricted appendage: left arm  , Pulmonary Assessment Handoff:  Lung sounds: Bilateral Breath Sounds: Diminished, Clear O2 Device: Nasal Cannula O2 Flow Rate (L/min): 2 L/min Now on RA for trial    R Recommendations: See Admitting Provider Note  Report given to:   Additional Notes:
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
## 2023-07-11 NOTE — Progress Notes (Signed)
Nephrology Follow-Up Consult note   Assessment/Recommendations: [name] is a/an 58 y.o. female with a past medical history significant for ESRD, admitted for influenza c/b acute hypoxic respiratory failure.     Outpatient HD orders:  [facility]  MWF  No heparin  3 hours Noncompliant Frequently signs off or misses EDW 43 kg  BF 400 DF 500 3K/2.5 calcium  Calcitriol 1 mcg each tx Mircera 175 mcg every 2 weeks  Left AVF    Acute hypoxic respiratory failure: 2/2 volume overload and possible pneumonia.  Improving with ultrafiltration  ESRD -underwent dialysis on 2/13.  Plan for dialysis again tomorrow then resume normal schedule next week  Hypertension/volume  - markedly elevated but has improved with ultrafiltration and continuing home medications.  Likely noncompliant outpatient  Anemia  - hgb at goal. No esa Possible pneumonia/Leukocytosis: seems like pneumonia based on appearance and leukocytosis. work up and mgmt per primary team  Metabolic bone disease -   continue with home meds  Nutrition -  renal diet  Disposition - per primary, staying today   Recommendations conveyed to primary service.    [name] [facility] 07/11/2023 11:27 AM  ___________________________________________________________  CC: shortness of breath  Interval History/Subjective: Patient states dialysis went well yesterday.  Continues to feel tired but otherwise no complaints.   Medications:  Current Facility-Administered Medications  Medication Dose Route Frequency Provider Last Rate Last Admin   acetaminophen (TYLENOL) tablet 1,000 mg  1,000 mg Oral Q6H PRN Rhetta Mura, MD   650 mg at 07/10/23 1524   albuterol (PROVENTIL) (2.5 MG/3ML) 0.083% nebulizer solution 2.5 mg  2.5 mg Nebulization Q2H PRN Rhetta Mura, MD       azithromycin (ZITHROMAX) 500 mg in sodium chloride 0.9 % 250 mL IVPB  500 mg Intravenous Q24H Rhetta Mura, MD    Stopped at 07/10/23 1825   calcitRIOL (ROCALTROL) capsule 1 mcg  1 mcg Oral Q M,W,F Samtani, Jai-Gurmukh, MD       cefTRIAXone (ROCEPHIN) 2 g in sodium chloride 0.9 % 100 mL IVPB  2 g Intravenous Q24H Rhetta Mura, MD       Chlorhexidine Gluconate Cloth 2 % PADS 6 each  6 each Topical Q0600 Rhetta Mura, MD       cloNIDine (CATAPRES) tablet 0.1 mg  0.1 mg Oral TID Rhetta Mura, MD   0.1 mg at 07/10/23 1854   cyclobenzaprine (FLEXERIL) tablet 5 mg  5 mg Oral TID PRN Rhetta Mura, MD       furosemide (LASIX) tablet 80 mg  80 mg Oral Once per day on Sunday Tuesday Thursday Saturday Rhetta Mura, MD   80 mg at 07/10/23 1029   heparin injection 5,000 Units  5,000 Units Subcutaneous Q8H Rhetta Mura, MD   5,000 Units at 07/11/23 0847   levETIRAcetam (KEPPRA) tablet 1,000 mg  1,000 mg Oral QPM Rhetta Mura, MD   1,000 mg at 07/10/23 1854   levETIRAcetam (KEPPRA) tablet 500 mg  500 mg Oral Q M,W,F-1800 Rhetta Mura, MD       lidocaine-prilocaine (EMLA) cream 1 Application  1 Application Topical Once per day on Monday Wednesday Friday Rhetta Mura, MD       metoprolol tartrate (LOPRESSOR) injection 5 mg  5 mg Intravenous Q6H PRN Rhetta Mura, MD       pantoprazole (PROTONIX) EC tablet 40 mg  40 mg Oral Daily Rhetta Mura, MD   40 mg at 07/10/23 1029   pentafluoroprop-tetrafluoroeth (GEBAUERS) aerosol 1 Application  1 Application Topical PRN Rhetta Mura, MD       senna-docusate (Senokot-S) tablet 2 tablet  2 tablet Oral QHS Rhetta Mura, MD   2 tablet at 07/10/23 2148   sevelamer carbonate (RENVELA) tablet 800 mg  800 mg Oral TID WC Rhetta Mura, MD   800 mg at 07/11/23 0848   vancomycin (VANCOREADY) IVPB 500 mg/100 mL  500 mg Intravenous Q M,W,F-HD Earnie Larsson, [facility]   Stopped at 07/10/23 2007   Current Outpatient Medications  Medication Sig Dispense Refill   acetaminophen (TYLENOL) 500 MG  tablet Take 1,000 mg by mouth every 6 (six) hours as needed for moderate pain.     albuterol (PROVENTIL) (2.5 MG/3ML) 0.083% nebulizer solution Take 3 mLs (2.5 mg total) by nebulization every 4 (four) hours as needed for wheezing or shortness of breath. 75 mL 2   amLODipine (NORVASC) 5 MG tablet Take 1 tablet (5 mg total) by mouth daily. Take 1 tablet on Monday,Wednesday and Friday then take 2 tablets on Tuesday,Thursday,Saturday and Sunday. 90 tablet 3   carvedilol (COREG) 12.5 MG tablet Take 1 tablet (12.5 mg total) by mouth 2 (two) times daily with a meal. 180 tablet 3   cloNIDine (CATAPRES) 0.1 MG tablet Take 1 tablet (0.1 mg total) by mouth 3 (three) times daily.     cyclobenzaprine (FLEXERIL) 5 MG tablet Take 5 mg by mouth 3 (three) times daily as needed for muscle spasms.     furosemide (LASIX) 80 MG tablet Take 80 mg by mouth 4 (four) times a week.     guaifenesin (ROBITUSSIN) 100 MG/5ML syrup Take 5 mLs by mouth 3 (three) times daily as needed for cough or congestion.     levETIRAcetam (KEPPRA) 500 MG tablet Take 2 tablets (1,000 mg total) by mouth every evening. Take an additional tablet on Monday,Wednesday and Friday evening after Dialysis 90 tablet 3   lidocaine-prilocaine (EMLA) cream Apply 1 Application topically 3 (three) times a week.     pantoprazole (PROTONIX) 40 MG tablet Take 1 tablet (40 mg total) by mouth daily. 30 tablet 0   senna-docusate (SENOKOT-S) 8.6-50 MG tablet Take 2 tablets by mouth at bedtime. 60 tablet 3   sevelamer carbonate (RENVELA) 800 MG tablet Take 800 mg by mouth 3 (three) times daily.     predniSONE (DELTASONE) 20 MG tablet Take 2 tablets by mouth daily x 2 days; then 1 tablet by mouth daily x 3 days; then half tablet by mouth daily x 3 days and stop prednisone. 10 tablet 0      Review of Systems: 10 systems reviewed and negative except per interval history/subjective  Physical Exam: Vitals:   07/11/23 1100 07/11/23 1110  BP: (!) 159/84   Pulse: 80 82   Resp:  19  Temp:    SpO2:  90%   No intake/output data recorded.  Intake/Output Summary (Last 24 hours) at 07/11/2023 1127 Last data filed at 07/11/2023 0445 Gross per 24 hour  Intake 400.33 ml  Output 4150 ml  Net -3749.67 ml   Constitutional: Ill-appearing, lying in bed, no distress ENMT: ears and nose without scars or lesions, MMM CV: normal rate, no edema Respiratory: Bilateral chest rise, no increased work of breathing Gastrointestinal: soft, non-tender, no palpable masses or hernias Skin: no visible lesions or rashes Psych: Awake and alert, oriented to person place and time, judgment and insight seem impaired   Test Results I personally reviewed new and old clinical labs and radiology tests Lab Results  Component Value Date   NA 134 (L) 07/11/2023   K 4.2 07/11/2023   CL 93 (L) 07/11/2023   CO2 30 07/11/2023   BUN 31 (H) 07/11/2023   CREATININE 2.73 (H) 07/11/2023   CALCIUM 8.5 (L) 07/11/2023   ALBUMIN 3.0 (L) 07/11/2023   PHOS 5.6 (H) 06/29/2023    CBC Recent Labs  Lab 07/10/23 0549 07/11/23 0404  WBC 29.2* 16.0*  NEUTROABS 26.9*  --   HGB 12.2 11.4*  HCT 39.7 37.9  MCV 91.5 92.4  PLT 149* 129*

## 2023-07-11 NOTE — ED Notes (Signed)
Trial on RA per attending

## 2023-07-11 NOTE — Plan of Care (Signed)

## 2023-07-11 NOTE — TOC Initial Note (Addendum)
Transition of Care [facility]) - Initial/Assessment Note    Patient Details  Name: [name] MRN: [id number] Date of Birth: [birth year]  [facility] CM/SW Contact:    Karn Cassis, LCSW Phone Number: 07/11/2023, 8:40 AM  Clinical Narrative: Pt admitted for hypoxic respiratory failure. Assessment completed due to high risk readmission score. Pt well known to TOC from previous admissions. She lives with her daughter who assists with ADLs. Pt has electric wheelchair, hospital bed, and hoyer lift in the home. She uses [employer] for transportation. Pt's dialysis is MWF at [facility]. Pt is active with [facility] RN. Clydie Braun with [facility] notified of admission. Will need resumption orders.   Expected Discharge Plan: Home w Home Health Services Barriers to Discharge: Continued Medical Work up   Patient Goals and CMS Choice Patient states their goals for this hospitalization and ongoing recovery are:: return home   Choice offered to / list presented to : Adult Children [place] ownership interest in [facility].provided to::  (n/a)    Expected Discharge Plan and Services In-house Referral: Clinical Social Work   Post Acute Care Choice: Resumption of Svcs/PTA Provider Living arrangements for the past 2 months: Single Family Home                             HH Agency: [facility] Date [facility] Agency Contacted: 07/11/23 Time HH Agency Contacted: 0840 Representative spoke with at [facility] Agency: Clydie Braun  Prior Living Arrangements/Services Living arrangements for the past 2 months: Single Family Home Lives with:: Adult Children Patient language and need for interpreter reviewed:: Yes Do you feel safe going back to the place where you live?: Yes      Need for Family Participation in Patient Care: Yes (Comment) Care giver support system in place?: Yes (comment) Current home services: DME (hospital bed, hoyer lift, wheelchair,  electric wheelchair) Criminal Activity/Legal Involvement Pertinent to Current Situation/Hospitalization: No - Comment as needed  Activities of Daily Living      Permission Sought/Granted                  Emotional Assessment         Alcohol / Substance Use: Not Applicable Psych Involvement: No (comment)  Admission diagnosis:  Paraplegia (HCC) [G82.20] Hyponatremia [E87.1] Acute pulmonary edema (HCC) [J81.0] End-stage renal disease on hemodialysis (HCC) [N18.6, Z99.2] PNA (pneumonia) [J18.9] Patient Active Problem List   Diagnosis Date Noted   RSV (respiratory syncytial virus infection) 06/28/2023   Volume overload 05/20/2023   Legionella pneumonia (HCC) 05/05/2023   Acute respiratory failure with hypoxia (HCC) 05/02/2023   Pulmonary edema 04/13/2023   Diarrhea 04/13/2023   Respiratory failure with hypoxia (HCC) 04/01/2023   Dyspnea and respiratory abnormalities 03/17/2023   Dyspnea 03/05/2023   Retroperitoneal hematoma 12/31/2022   Subcutaneous hematoma 12/31/2022   Hip osteomyelitis, right (HCC) 12/18/2022   Sepsis due to undetermined organism (HCC) 12/18/2022   Chronic pain 12/14/2022   Bacteremia 11/08/2022   Loss of weight 10/26/2022   N&V (nausea and vomiting) 10/26/2022   Cellulitis of knee, left 07/24/2022   Infected decubitus ulcer 07/23/2022   Osteomyelitis of multiple sites-chronic     Chronic anemia 02/25/2022   ESRD on hemodialysis (HCC) 02/25/2022   Abscess of buttock, right 08/25/2021   GERD (gastroesophageal reflux disease) 08/25/2021   Anxiety 08/25/2021   Decubitus ulcers 08/25/2021   Anemia 08/24/2021   AKI (acute kidney injury) (  HCC) 04/25/2017   Dehydration 04/25/2017   Metabolic acidosis 04/25/2017   Altered mental status 06/22/2016   Altered mental state 08/16/2015   Pressure ulcer 05/16/2015   Acute encephalopathy 05/15/2015   Malnutrition (HCC)    Encephalopathy acute 10/10/2014   Sepsis (HCC) 10/10/2014   PNA (pneumonia)  10/10/2014   Encephalomalacia 10/10/2014   TBI (traumatic brain injury) (HCC) 10/10/2014   Severe protein-calorie malnutrition (HCC) 10/10/2014   UTI (urinary tract infection) 08/26/2014   Hypokalemia 08/26/2014   Abnormal uterine bleeding (AUB) 06/15/2014   Essential hypertension    Seizure disorder (HCC)    Status epilepticus (HCC) 05/02/2014   Hypertensive emergency 05/02/2014   Acute renal failure (HCC) 05/21/2013   Hyperkalemia 05/21/2013   Hypothermia 05/21/2013   Paraplegia (HCC) 06/25/2012   Suprapubic catheter (HCC) 06/25/2012   Decubitus ulcer of buttock 06/25/2012   PCP:  Patient, No Pcp Per Pharmacy:   [facility] - [place], [place] - [address] [place] [address] Phone: [phone] Fax: [phone]     Social Drivers of Health (SDOH) Social History: SDOH Screenings   Food Insecurity: No Food Insecurity (06/29/2023)  Housing: Low Risk  (06/29/2023)  Transportation Needs: Patient Declined (06/29/2023)  Utilities: Not At Risk (06/29/2023)  Tobacco Use: Low Risk  (07/10/2023)   SDOH Interventions:     Readmission Risk Interventions    07/11/2023    8:38 AM 06/30/2023   10:47 AM 05/21/2023   12:18 PM  Readmission Risk Prevention Plan  Transportation Screening Complete Complete Complete  Medication Review Oceanographer) Complete Complete Complete  HRI or Home Care Consult Complete Complete Complete  SW Recovery Care/Counseling Consult Complete Complete Complete  Palliative Care Screening Not Applicable Not Applicable Not Applicable  Skilled Nursing Facility Not Applicable Not Applicable Not Applicable

## 2023-07-12 DIAGNOSIS — G40909 Epilepsy, unspecified, not intractable, without status epilepticus: Secondary | ICD-10-CM | POA: Diagnosis not present

## 2023-07-12 DIAGNOSIS — L8990 Pressure ulcer of unspecified site, unspecified stage: Secondary | ICD-10-CM

## 2023-07-12 DIAGNOSIS — G8220 Paraplegia, unspecified: Secondary | ICD-10-CM

## 2023-07-12 DIAGNOSIS — J189 Pneumonia, unspecified organism: Secondary | ICD-10-CM | POA: Diagnosis not present

## 2023-07-12 DIAGNOSIS — N186 End stage renal disease: Secondary | ICD-10-CM

## 2023-07-12 DIAGNOSIS — Z992 Dependence on renal dialysis: Secondary | ICD-10-CM

## 2023-07-12 DIAGNOSIS — L89202 Pressure ulcer of unspecified hip, stage 2: Secondary | ICD-10-CM

## 2023-07-12 DIAGNOSIS — I10 Essential (primary) hypertension: Secondary | ICD-10-CM | POA: Diagnosis not present

## 2023-07-12 LAB — CBC
HCT: 33.1 % — ABNORMAL LOW (ref 36.0–46.0)
Hemoglobin: 10 g/dL — ABNORMAL LOW (ref 12.0–15.0)
MCH: 27.9 pg (ref 26.0–34.0)
MCHC: 30.2 g/dL (ref 30.0–36.0)
MCV: 92.5 fL (ref 80.0–100.0)
Platelets: 158 10*3/uL (ref 150–400)
RBC: 3.58 MIL/uL — ABNORMAL LOW (ref 3.87–5.11)
RDW: 20.4 % — ABNORMAL HIGH (ref 11.5–15.5)
WBC: 6.7 10*3/uL (ref 4.0–10.5)
nRBC: 0 % (ref 0.0–0.2)

## 2023-07-12 LAB — RENAL FUNCTION PANEL
Albumin: 2.8 g/dL — ABNORMAL LOW (ref 3.5–5.0)
Anion gap: 15 (ref 5–15)
BUN: 64 mg/dL — ABNORMAL HIGH (ref 6–20)
CO2: 25 mmol/L (ref 22–32)
Calcium: 8.9 mg/dL (ref 8.9–10.3)
Chloride: 93 mmol/L — ABNORMAL LOW (ref 98–111)
Creatinine, Ser: 5.02 mg/dL — ABNORMAL HIGH (ref 0.44–1.00)
GFR, Estimated: 10 mL/min — ABNORMAL LOW (ref >60)
Glucose, Bld: 119 mg/dL — ABNORMAL HIGH (ref 70–99)
Phosphorus: 5 mg/dL — ABNORMAL HIGH (ref 2.5–4.6)
Potassium: 4.8 mmol/L (ref 3.5–5.1)
Sodium: 133 mmol/L — ABNORMAL LOW (ref 135–145)

## 2023-07-12 MED ORDER — ONDANSETRON HCL 4 MG/2ML IJ SOLN
4.0000 mg | Freq: Four times a day (QID) | INTRAMUSCULAR | Status: DC | PRN
  Administered 2023-07-12: 4 mg via INTRAVENOUS
  Filled 2023-07-12: qty 2

## 2023-07-12 MED ORDER — GUAIFENESIN-DM 100-10 MG/5ML PO SYRP
5.0000 mL | ORAL_SOLUTION | ORAL | Status: DC | PRN
  Administered 2023-07-12 – 2023-07-14 (×4): 5 mL via ORAL
  Filled 2023-07-12 (×5): qty 5

## 2023-07-12 MED ORDER — VANCOMYCIN HCL 500 MG/100ML IV SOLN
500.0000 mg | INTRAVENOUS | Status: DC
  Filled 2023-07-12: qty 100

## 2023-07-12 NOTE — Hospital Course (Addendum)
[name] was admitted to the hospital with the working diagnosis of acute hypoxemic respiratory failure due to volume overload.   58 yo female with the past medical history of hypertension, ESRD on HD, paraplegia, encephalomalacia, wheelchair bound, suprapubic catheter due to neurogenic bladder, bilateral sacral decubitus ulcers, COPD and seizures who presented with dyspnea.  Recent hospitalization 01/14 to 01/15 due to volume overload and 02/01 to 07/01/23 for RSV related respiratory failure.  On the day of hospitalization EMS was called due to severe dyspnea, she was found with 02 saturation 88% with blood pressure 200/140, she was placed on non-re breather mask 10L and transported to the ED.  On her initial physical examination her blood pressure 156/76, HR 104, RR 22 and 02 saturation 96% on supplemental 02 per [place].  Lungs with no wheezing or rhonchi, heart with S1 and S2 present and regular, abdomen sofr and no lower extremity edema.   Na 134, K 4,4 Cl 100, bicarbonate 23 glucose 92 bun 42 cr 3,6  Wbc 29,2 hgb 12.2 plt 149  Sars covid 19 negative Influenza negative  Chest radiograph with cardiomegaly, bilateral hilar vascular congestion, bilateral small pleural effusions. Fluid in the right fissure.   CT chest with bilateral ground glass opacities, bilateral small pleural effusions with right and left lower lobe compression atelectasis.   EKG 111 bpm, right axis deviation, right bundle branch block, sinus rhythm with no significant ST segment or T wave changes.   02/13 patient underwent HD with ultrafiltration, 3,900 ml removed.  02/15 HD today.  02/16 improvement in oxygenation and dyspnea.

## 2023-07-12 NOTE — Assessment & Plan Note (Addendum)
Wbc is down to 7.4  Blood cultures have been no growth.  She has been afebrile.   Plan to continue antibiotic therapy for a total of 5 days will change ceftriaxone to cefdinir.  Continue with airway clearing techniques with flutter valve and incentive spirometer.

## 2023-07-12 NOTE — Assessment & Plan Note (Signed)
No active seizure.  Continue with Keppra per home regimen, 1000 mg at bedtime and extra 500 mg on HD days, Monday Wednesday and Friday.

## 2023-07-12 NOTE — Progress Notes (Signed)
  HEMODIALYSIS TREATMENT NOTE:  Uneventful 3 hour heparin-free treatment completed using left upper arm AVF (15g/antegrade). Goal met: 3 liters removed.  No interruption in UF.  All blood was returned. Hemostasis was achieved in 15 minutes.   Post-HD:  07/12/23 1915  Vitals  BP (!) 180/81  MAP (mmHg) 120  BP Location Right Arm  BP Method Automatic  Patient Position (if appropriate) Lying  Pulse Rate 89  Pulse Rate Source Monitor  ECG Heart Rate 90  Resp 20  Oxygen Therapy  SpO2 97 %  O2 Device Room Air  Post Treatment  Dialyzer Clearance Lightly streaked  Hemodialysis Intake (mL) 0 mL  Liters Processed 62.6  Fluid Removed (mL) 3000 mL  Tolerated HD Treatment Yes  Post-Hemodialysis Comments Goal met  AVG/AVF Arterial Site Held (minutes) 5 minutes  AVG/AVF Venous Site Held (minutes) 5 minutes  Fistula / Graft Left Upper arm Arteriovenous fistula  No placement date or time found.   Placed prior to admission: Yes  Orientation: Left  Access Location: Upper arm  Access Type: Arteriovenous fistula  Fistula / Graft Assessment Thrill;Bruit  Status Patent    Arman Filter, RN AP [facility]

## 2023-07-12 NOTE — Assessment & Plan Note (Addendum)
Hyponatremia,  Volume overload related to end stage renal disease.  Improved volume status.   Her BUN is down to 32 from 64, K is 4,8 and serum bicarbonate at 28  Na 132.  P 4,8   She has responded well to aggressive inpatient ultrafiltration.  Oxygenation today is 95% on room air.  Patient regular schedule is M-W -F.  Continue with furosemide 80 mg on non HD days.   Anemia of chronic renal disease. Cell count has been stable.   Metabolic bone disease. Continue with sevelamer and calcitriol.

## 2023-07-12 NOTE — Plan of Care (Signed)

## 2023-07-12 NOTE — Assessment & Plan Note (Addendum)
Stage 2 bilateral ischial tuberosities and coccyx Continue local wound care.

## 2023-07-12 NOTE — Assessment & Plan Note (Addendum)
11/2022 MRI with Left greater trochanter with osteomyelitis.  Old records personally reviewed wound care follow up 05/2023 with right ischium with no exposed bone and red granulation with biofilm. Left ischium with red granulation. No infection noted. Will hold IV vancomycin for now.

## 2023-07-12 NOTE — Assessment & Plan Note (Addendum)
Continue blood pressure monitoring.  Continue with clonidine 0,1 mg tid.

## 2023-07-12 NOTE — Progress Notes (Signed)
Progress Note   Patient: Naseem Adler GEX:[id number] DOB: 1966/02/17 DOA: 07/10/2023     2 DOS: the patient was seen and examined on 07/12/2023   Brief hospital course: Mrs. Madrid was admitted to the hospital with the working diagnosis of acute hypoxemic respiratory failure due to volume overload.   58 yo female with the past medical history of hypertension, ESRD on HD, paraplegia, encephalomalacia, wheelchair bound, suprapubic catheter due to neurogenic bladder, bilateral sacral decubitus ulcers, COPD and seizures who presented with dyspnea.  Recent hospitalization 01/14 to 01/15 due to volume overload and 02/01 to 07/01/23 for RSV related respiratory failure.  On the day of hospitalization EMS was called due to severe dyspnea, she was found with 02 saturation 88% with blood pressure 200/140, she was placed on non-re breather mask 10L and transported to the ED.  On her initial physical examination her blood pressure 156/76, HR 104, RR 22 and 02 saturation 96% on supplemental 02 per [place].  Lungs with no wheezing or rhonchi, heart with S1 and S2 present and regular, abdomen sofr and no lower extremity edema.   Na 134, K 4,4 Cl 100, bicarbonate 23 glucose 92 bun 42 cr 3,6  Wbc 29,2 hgb 12.2 plt 149  Sars covid 19 negative Influenza negative  Chest radiograph with cardiomegaly, bilateral hilar vascular congestion, bilateral small pleural effusions. Fluid in the right fissure.   CT chest with bilateral ground glass opacities, bilateral small pleural effusions with right and left lower lobe compression atelectasis.   EKG 111 bpm, right axis deviation, right bundle branch block, sinus rhythm with no significant ST segment or T wave changes.   02/13 patient underwent HD with ultrafiltration, 3,900 ml removed.  02/15 HD today.   Assessment and Plan: * ESRD on hemodialysis (HCC) Hyponatremia,  Volume overload related to end stage renal disease.  Clinically continue volume overload.    Plan for HD today with ultrafiltration.  Plan to check renal panel post HD.  Continue with furosemide 80 mg on non HD days.   Anemia of chronic renal disease. Cell count has been stable.   Metabolic bone disease. Continue with sevelamer and calcitriol.   PNA (pneumonia) Wbc is down to 16,0 Blood cultures have been no growth.  She has been afebrile.   Plan to continue antibiotic therapy for a total of 5 days with IV ceftriaxone.  Add airway clearing techniques with flutter valve and incentive spirometer.  Continue ultrafiltration per HD.  Supplemental 02 per [place] to keep 02 saturation 92% or greater.   Seizure disorder (HCC) No active seizure.  Continue with Keppra per home regimen, 1000 mg at bedtime and extra 500 mg on HD days, Monday Wednesday and Friday.   Essential hypertension Continue blood pressure monitoring.  Continue with clonidine 0,1 mg tid.   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  Old records personally reviewed wound care follow up 05/2023 with right ischium with no exposed bone and red granulation with biofilm. Left ischium with red granulation. No infection noted. Will hold IV vancomycin for now.   Pressure injury of skin Stage 2 bilateral ischial tuberosities and coccyx Continue local wound care.     Subjective: Today patient very weak and deconditioned, continue to have dyspnea and congestion, positive cough and nausea.   Physical Exam: Vitals:   07/11/23 1400 07/11/23 1605 07/11/23 2000 07/12/23 1038  BP: (!) 141/82 (!) 144/85 (!) 148/71 (!) 158/82  Pulse: 87 92 90 100  Resp: 20  Temp:  97.7 F (36.5 C) (!) 97.4 F (36.3 C) 97.7 F (36.5 C)  TempSrc:  Oral Oral Oral  SpO2: 92% 96% 94% 94%  Weight:  45.2 kg    Height:       Neurology awake and alert, deconditioned and ill looking appearing ENT with positive pallor with no icterus Cardiovascular with S1 and S2 present and regular with no gallops,  rubs or murmurs positive JVD No lower extremity edema.  Respiratory with bilateral rhonchi with no wheezing, scattered rales, on anterior auscultation  Abdomen with no distention   Data Reviewed:    Family Communication: no family at the bedside   Disposition: Status is: Inpatient Remains inpatient appropriate because: inpatient renal replacement therapy with ultrafiltration   Planned Discharge Destination: Home    Author: Coralie Keens, MD 07/12/2023 11:47 AM  For on call review [email or web address].

## 2023-07-13 DIAGNOSIS — J189 Pneumonia, unspecified organism: Secondary | ICD-10-CM | POA: Diagnosis not present

## 2023-07-13 DIAGNOSIS — G40909 Epilepsy, unspecified, not intractable, without status epilepticus: Secondary | ICD-10-CM | POA: Diagnosis not present

## 2023-07-13 DIAGNOSIS — I10 Essential (primary) hypertension: Secondary | ICD-10-CM | POA: Diagnosis not present

## 2023-07-13 DIAGNOSIS — N186 End stage renal disease: Secondary | ICD-10-CM | POA: Diagnosis not present

## 2023-07-13 LAB — RENAL FUNCTION PANEL
Albumin: 2.9 g/dL — ABNORMAL LOW (ref 3.5–5.0)
Anion gap: 13 (ref 5–15)
BUN: 32 mg/dL — ABNORMAL HIGH (ref 6–20)
CO2: 28 mmol/L (ref 22–32)
Calcium: 9.1 mg/dL (ref 8.9–10.3)
Chloride: 91 mmol/L — ABNORMAL LOW (ref 98–111)
Creatinine, Ser: 3.02 mg/dL — ABNORMAL HIGH (ref 0.44–1.00)
GFR, Estimated: 18 mL/min — ABNORMAL LOW (ref >60)
Glucose, Bld: 93 mg/dL (ref 70–99)
Phosphorus: 4.8 mg/dL — ABNORMAL HIGH (ref 2.5–4.6)
Potassium: 4.8 mmol/L (ref 3.5–5.1)
Sodium: 132 mmol/L — ABNORMAL LOW (ref 135–145)

## 2023-07-13 LAB — CBC WITH DIFFERENTIAL/PLATELET
Abs Immature Granulocytes: 0.03 10*3/uL (ref 0.00–0.07)
Basophils Absolute: 0 10*3/uL (ref 0.0–0.1)
Basophils Relative: 0 %
Eosinophils Absolute: 0.3 10*3/uL (ref 0.0–0.5)
Eosinophils Relative: 4 %
HCT: 37.7 % (ref 36.0–46.0)
Hemoglobin: 10.9 g/dL — ABNORMAL LOW (ref 12.0–15.0)
Immature Granulocytes: 0 %
Lymphocytes Relative: 23 %
Lymphs Abs: 1.7 10*3/uL (ref 0.7–4.0)
MCH: 27 pg (ref 26.0–34.0)
MCHC: 28.9 g/dL — ABNORMAL LOW (ref 30.0–36.0)
MCV: 93.5 fL (ref 80.0–100.0)
Monocytes Absolute: 0.6 10*3/uL (ref 0.1–1.0)
Monocytes Relative: 8 %
Neutro Abs: 4.8 10*3/uL (ref 1.7–7.7)
Neutrophils Relative %: 65 %
Platelets: 163 10*3/uL (ref 150–400)
RBC: 4.03 MIL/uL (ref 3.87–5.11)
RDW: 20.3 % — ABNORMAL HIGH (ref 11.5–15.5)
WBC: 7.4 10*3/uL (ref 4.0–10.5)
nRBC: 0 % (ref 0.0–0.2)

## 2023-07-13 MED ORDER — CEFUROXIME AXETIL 250 MG PO TABS
250.0000 mg | ORAL_TABLET | ORAL | Status: DC
  Administered 2023-07-14: 250 mg via ORAL
  Filled 2023-07-13: qty 1

## 2023-07-13 NOTE — Progress Notes (Signed)
Progress Note   Patient: [name] ZDG:[id number] DOB: 1966/01/02 DOA: 07/10/2023     3 DOS: the patient was seen and examined on 07/13/2023   Brief hospital course: [name] was admitted to the hospital with the working diagnosis of acute hypoxemic respiratory failure due to volume overload.   58 yo female with the past medical history of hypertension, ESRD on HD, paraplegia, encephalomalacia, wheelchair bound, suprapubic catheter due to neurogenic bladder, bilateral sacral decubitus ulcers, COPD and seizures who presented with dyspnea.  Recent hospitalization 01/14 to 01/15 due to volume overload and 02/01 to 07/01/23 for RSV related respiratory failure.  On the day of hospitalization EMS was called due to severe dyspnea, she was found with 02 saturation 88% with blood pressure 200/140, she was placed on non-re breather mask 10L and transported to the ED.  On her initial physical examination her blood pressure 156/76, HR 104, RR 22 and 02 saturation 96% on supplemental 02 per [place].  Lungs with no wheezing or rhonchi, heart with S1 and S2 present and regular, abdomen sofr and no lower extremity edema.   Na 134, K 4,4 Cl 100, bicarbonate 23 glucose 92 bun 42 cr 3,6  Wbc 29,2 hgb 12.2 plt 149  Sars covid 19 negative Influenza negative  Chest radiograph with cardiomegaly, bilateral hilar vascular congestion, bilateral small pleural effusions. Fluid in the right fissure.   CT chest with bilateral ground glass opacities, bilateral small pleural effusions with right and left lower lobe compression atelectasis.   EKG 111 bpm, right axis deviation, right bundle branch block, sinus rhythm with no significant ST segment or T wave changes.   02/13 patient underwent HD with ultrafiltration, 3,900 ml removed.  02/15 HD today.  02/16 improvement in oxygenation and dyspnea.   Assessment and Plan: * ESRD on hemodialysis (HCC) Hyponatremia,  Volume overload related to end stage renal  disease.  Improved volume status.   Her BUN is down to 32 from 64, K is 4,8 and serum bicarbonate at 28  Na 132.  P 4,8   She has responded well to aggressive inpatient ultrafiltration.  Oxygenation today is 95% on room air.  Patient regular schedule is M-W -F.  Continue with furosemide 80 mg on non HD days.   Anemia of chronic renal disease. Cell count has been stable.   Metabolic bone disease. Continue with sevelamer and calcitriol.   PNA (pneumonia) Wbc is down to 7.4  Blood cultures have been no growth.  She has been afebrile.   Plan to continue antibiotic therapy for a total of 5 days will change ceftriaxone to cefdinir.  Continue with airway clearing techniques with flutter valve and incentive spirometer.   Seizure disorder (HCC) No active seizure.  Continue with Keppra per home regimen, 1000 mg at bedtime and extra 500 mg on HD days, Monday Wednesday and Friday.   Essential hypertension Continue blood pressure monitoring.  Continue with clonidine 0,1 mg tid.   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  Old records personally reviewed wound care follow up 05/2023 with right ischium with no exposed bone and red granulation with biofilm. Left ischium with red granulation. No infection noted. Discontinued IV vancomycin.   Pressure injury of skin Stage 2 bilateral ischial tuberosities and coccyx Continue local wound care.     Subjective: Patient is feeling better, dyspnea has improved after ultrafiltration yesterday, continue to have cough with phlegm.   Physical Exam: Vitals:   07/12/23 1915 07/12/23 2100 07/13/23  1610 07/13/23 0729  BP: (!) 180/81 (!) 182/85 (!) 184/85 (!) 176/90  Pulse: 89 88 80 84  Resp: 20     Temp:  98.1 F (36.7 C) (!) 97.5 F (36.4 C) 98.1 F (36.7 C)  TempSrc:  Oral Oral Oral  SpO2: 97% 93% 95% 95%  Weight: 42.8 kg     Height:       Neurology awake and alert ENT with mild pallor Cardiovascular  with S1 and S2 present and regular with no gallops, rubs or murmurs No JVD Respiratory with bilateral rhonchi, improved from yesterday with no wheezing or rales  Abdomen with no distention  No lower extremity edema Bilateral stage IV pressure ulcers, greater trochanters    Left   Right   Data Reviewed:    Family Communication: I spoke with patient's daughter over the phone, we talked in detail about patient's condition, plan of care and prognosis and all questions were addressed.   Disposition: Status is: Inpatient Remains inpatient appropriate because: inpatient renal replacement therapy   Planned Discharge Destination: Home     Author: Coralie Keens, MD 07/13/2023 1:14 PM  For on call review [email or web address].

## 2023-07-13 NOTE — Consult Note (Signed)
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  Cover with dry gauze and silicone foam. Change dressing every M-W-F.   Low air loss mattress   Discussed POC with patient and bedside nurse.  Re consult if needed, will not follow at this time. Thanks  Aurianna Earlywine [employer], RN,CWOCN, CNS, CWON-AP [phone])

## 2023-07-14 DIAGNOSIS — N186 End stage renal disease: Secondary | ICD-10-CM | POA: Diagnosis not present

## 2023-07-14 DIAGNOSIS — Z992 Dependence on renal dialysis: Secondary | ICD-10-CM | POA: Diagnosis not present

## 2023-07-14 LAB — RENAL FUNCTION PANEL
Albumin: 2.9 g/dL — ABNORMAL LOW (ref 3.5–5.0)
Anion gap: 15 (ref 5–15)
BUN: 57 mg/dL — ABNORMAL HIGH (ref 6–20)
CO2: 24 mmol/L (ref 22–32)
Calcium: 9 mg/dL (ref 8.9–10.3)
Chloride: 92 mmol/L — ABNORMAL LOW (ref 98–111)
Creatinine, Ser: 4.62 mg/dL — ABNORMAL HIGH (ref 0.44–1.00)
GFR, Estimated: 11 mL/min — ABNORMAL LOW (ref >60)
Glucose, Bld: 120 mg/dL — ABNORMAL HIGH (ref 70–99)
Phosphorus: 6.1 mg/dL — ABNORMAL HIGH (ref 2.5–4.6)
Potassium: 5.5 mmol/L — ABNORMAL HIGH (ref 3.5–5.1)
Sodium: 131 mmol/L — ABNORMAL LOW (ref 135–145)

## 2023-07-14 LAB — CBC
HCT: 34.8 % — ABNORMAL LOW (ref 36.0–46.0)
Hemoglobin: 10.3 g/dL — ABNORMAL LOW (ref 12.0–15.0)
MCH: 27 pg (ref 26.0–34.0)
MCHC: 29.6 g/dL — ABNORMAL LOW (ref 30.0–36.0)
MCV: 91.1 fL (ref 80.0–100.0)
Platelets: 167 10*3/uL (ref 150–400)
RBC: 3.82 MIL/uL — ABNORMAL LOW (ref 3.87–5.11)
RDW: 20.4 % — ABNORMAL HIGH (ref 11.5–15.5)
WBC: 6.2 10*3/uL (ref 4.0–10.5)
nRBC: 0 % (ref 0.0–0.2)

## 2023-07-14 MED ORDER — HYDROMORPHONE HCL 2 MG PO TABS
2.0000 mg | ORAL_TABLET | Freq: Once | ORAL | Status: AC
  Administered 2023-07-14: 2 mg via ORAL
  Filled 2023-07-14: qty 1

## 2023-07-14 MED ORDER — CEFUROXIME AXETIL 250 MG PO TABS: 250.0000 mg | ORAL_TABLET | ORAL | 0 refills | Status: AC

## 2023-07-14 MED ORDER — RENA-VITE PO TABS: 1.0000 | ORAL_TABLET | Freq: Every day | ORAL | Status: DC

## 2023-07-14 NOTE — Progress Notes (Signed)
Patient ID: Jackee Glasner, female   DOB: March 26, 1966, 58 y.o.   MRN: [id number] S: complaining of nausea this am. O:BP (!) 185/94 (BP Location: Right Arm)   Pulse 89   Temp (!) 97.5 F (36.4 C) (Oral)   Resp (!) 22   Ht 5\' 2"  (1.575 m)   Wt 42.8 kg   LMP  (LMP Unknown)   SpO2 94%   BMI 17.26 kg/m   Intake/Output Summary (Last 24 hours) at 07/14/2023 0840 Last data filed at 07/13/2023 0900 Gross per 24 hour  Intake 240 ml  Output --  Net 240 ml   Intake/Output: I/O last 3 completed shifts: In: 480 [P.O.:480] Out: 3150 [Urine:150; Other:3000]  Intake/Output this shift:  No intake/output data recorded. Weight change:  Gen: NAD CVS: RRR Resp:CTA Abd: +BS, soft, NT/ND Ext: no edema, LAVF +T/B  Recent Labs  Lab 07/10/23 0549 07/11/23 0404 07/12/23 1654 07/13/23 0445  NA 134* 134* 133* 132*  K 4.4 4.2 4.8 4.8  CL 100 93* 93* 91*  CO2 23 30 25 28   GLUCOSE 92 100* 119* 93  BUN 42* 31* 64* 32*  CREATININE 3.66* 2.73* 5.02* 3.02*  ALBUMIN  --  3.0* 2.8* 2.9*  CALCIUM 8.7* 8.5* 8.9 9.1  PHOS  --   --  5.0* 4.8*  AST  --  15  --   --   ALT  --  14  --   --    Liver Function Tests: Recent Labs  Lab 07/11/23 0404 07/12/23 1654 07/13/23 0445  AST 15  --   --   ALT 14  --   --   ALKPHOS 79  --   --   BILITOT 0.8  --   --   PROT 6.9  --   --   ALBUMIN 3.0* 2.8* 2.9*   No results for input(s): "LIPASE", "AMYLASE" in the last 168 hours. No results for input(s): "AMMONIA" in the last 168 hours. CBC: Recent Labs  Lab 07/10/23 0549 07/11/23 0404 07/12/23 1654 07/13/23 0445  WBC 29.2* 16.0* 6.7 7.4  NEUTROABS 26.9*  --   --  4.8  HGB 12.2 11.4* 10.0* 10.9*  HCT 39.7 37.9 33.1* 37.7  MCV 91.5 92.4 92.5 93.5  PLT 149* 129* 158 163   Cardiac Enzymes: No results for input(s): "CKTOTAL", "CKMB", "CKMBINDEX", "TROPONINI" in the last 168 hours. CBG: No results for input(s): "GLUCAP" in the last 168 hours.  Iron Studies: No results for input(s): "IRON", "TIBC",  "TRANSFERRIN", "FERRITIN" in the last 72 hours. Studies/Results: No results found.  calcitRIOL  1 mcg Oral Q M,W,F   cefUROXime  250 mg Oral Q M,W,F-1800   Chlorhexidine Gluconate Cloth  6 each Topical Q0600   cloNIDine  0.1 mg Oral TID   feeding supplement (NEPRO CARB STEADY)  237 mL Oral BID BM   furosemide  80 mg Oral Once per day on Sunday Tuesday Thursday Saturday   heparin  5,000 Units Subcutaneous Q8H   levETIRAcetam  1,000 mg Oral QPM   levETIRAcetam  500 mg Oral Q M,W,F-1800   lidocaine-prilocaine  1 Application Topical Once per day on Monday Wednesday Friday   pantoprazole  40 mg Oral Daily   senna-docusate  2 tablet Oral QHS   sevelamer carbonate  800 mg Oral TID WC    BMET    Component Value Date/Time   NA 132 (L) 07/13/2023 0445   NA 137 06/14/2014 1403   K 4.8 07/13/2023 0445   CL 91 (  L) 07/13/2023 0445   CO2 28 07/13/2023 0445   GLUCOSE 93 07/13/2023 0445   BUN 32 (H) 07/13/2023 0445   BUN 10 06/14/2014 1403   CREATININE 3.02 (H) 07/13/2023 0445   CREATININE 0.76 04/18/2015 1424   CALCIUM 9.1 07/13/2023 0445   GFRNONAA 18 (L) 07/13/2023 0445   GFRAA 11 (L) 11/20/2017 1015   CBC    Component Value Date/Time   WBC 7.4 07/13/2023 0445   RBC 4.03 07/13/2023 0445   HGB 10.9 (L) 07/13/2023 0445   HCT 37.7 07/13/2023 0445   PLT 163 07/13/2023 0445   MCV 93.5 07/13/2023 0445   MCH 27.0 07/13/2023 0445   MCHC 28.9 (L) 07/13/2023 0445   RDW 20.3 (H) 07/13/2023 0445   RDW 15.0 06/14/2014 1403   LYMPHSABS 1.7 07/13/2023 0445   MONOABS 0.6 07/13/2023 0445   EOSABS 0.3 07/13/2023 0445   BASOSABS 0.0 07/13/2023 0445   Outpatient HD orders:  [facility] [employer]  MWF  No heparin  3 hours Noncompliant Frequently signs off or misses EDW 43 kg  BF 400 DF 500 3K/2.5 calcium  Calcitriol 1 mcg each tx Mircera 175 mcg every 2 weeks  Left AVF   Assessment/Plan:     Acute hypoxic respiratory failure: 2/2 volume overload and possible pneumonia.  Improving with  ultrafiltration and plan for HD today.  ESRD -underwent dialysis on 2/13.  Plan for dialysis again today to resume normal outpatient schedule  Hypertension/volume  - markedly elevated but has improved with ultrafiltration and continuing home medications.  Likely noncompliant outpatient  Anemia  - hgb at goal. No esa Possible pneumonia/Leukocytosis: seems like pneumonia based on appearance and leukocytosis. work up and mgmt per primary team  Metabolic bone disease -   continue with home meds  Nutrition -  renal diet  Disposition - per primary  Irena Cords, MD [facility]

## 2023-07-14 NOTE — Progress Notes (Signed)
Initial Nutrition Assessment  DOCUMENTATION CODES:   Not applicable  INTERVENTION:   Continue Nepro Shake po BID, each supplement provides 425 kcal and 19 grams protein. Add renal MVI daily.  Consider checking vitamin C and zinc labs to determine if replacement is needed. Low vitamin C and zinc levels may hinder wound healing.   NUTRITION DIAGNOSIS:   Increased nutrient needs related to wound healing as evidenced by estimated needs.  GOAL:   Patient will meet greater than or equal to 90% of their needs  MONITOR:   PO intake, Supplement acceptance, Skin  REASON FOR ASSESSMENT:   Consult Assessment of nutrition requirement/status  ASSESSMENT:   58 yo female admitted with acute respiratory failure d/t volume overload. PMH includes ESRD on HD, paraplegia, encephalomacia, wheelchair bound suprapubic catheter, bilateral sacral decubitus ulcers, COPD, seizures, HTN, uterine cancer.  Unable to complete NFPE or speak with patient at this time. RD working remotely. Patient is currently on a renal diet with 1200 ml fluid restriction. She is consuming 75-100% of her meals. She is drinking Nepro shakes BID.   Labs reviewed. Na 132, phos 4.8  Medications reviewed and include calcitriol, lasix, keppra, senokot-s, renvela.  Weight history reviewed. No significant weight changes noted. Weight appears to fluctuate, likely r/t fluid status with hx ESRD on HD. BMI is low partly r/t muscle loss with hx paraplegia. Need to perform NFPE to evaluate for muscle and fat depletions.   NUTRITION - FOCUSED PHYSICAL EXAM:  Unable to complete, RD working remotely  Diet Order:   Diet Order             Diet renal with fluid restriction Fluid restriction: 1200 mL Fluid; Room service appropriate? Yes; Fluid consistency: Thin  Diet effective now                   EDUCATION NEEDS:   Not appropriate for education at this time  Skin:  Skin Assessment: Skin Integrity Issues: Skin Integrity  Issues:: Stage II, Stage IV Stage II: coccyx Stage IV: L & R ischial tuberosity  Last BM:  2/17 type 1  Height:   Ht Readings from Last 1 Encounters:  07/10/23 5\' 2"  (1.575 m)    Weight:   Wt Readings from Last 1 Encounters:  07/12/23 42.8 kg    BMI:  Body mass index is 17.26 kg/m.   Estimated Nutritional Needs:   Kcal:  1500-1700  Protein:  75-85 gm  Fluid:  >/= 1.5 L   Gabriel Rainwater RD, LDN, CNSC Contact via secure chat. If unavailable, use group chat "RD Inpatient."

## 2023-07-14 NOTE — TOC Transition Note (Signed)
Transition of Care [facility]) - Discharge Note   Patient Details  Name: [name] MRN: [id number] Date of Birth: 1966/01/27  [facility]) CM/SW Contact:  Karn Cassis, LCSW Phone Number: 07/14/2023, 12:51 PM   Clinical Narrative: Pt d/c today. Clydie Braun with [facility] notified. HHRN order in.        Barriers to Discharge: Barriers Resolved   Patient Goals and CMS Choice Patient states their goals for this hospitalization and ongoing recovery are:: return home   Choice offered to / list presented to : Adult Children Register ownership interest in [facility].provided to::  (n/a)    Discharge Placement                       Discharge Plan and Services Additional resources added to the After Visit Summary for   In-house Referral: Clinical Social Work   Post Acute Care Choice: Resumption of Svcs/PTA Provider                    HH Arranged: RN [facility] Agency: [facility] Date [facility] Agency Contacted: 07/14/23 Time HH Agency Contacted: [phone] Representative spoke with at [facility] Agency: Clydie Braun  Social Drivers of Health (SDOH) Interventions SDOH Screenings   Food Insecurity: No Food Insecurity (07/11/2023)  Housing: Low Risk  (07/11/2023)  Transportation Needs: No Transportation Needs (07/11/2023)  Utilities: Not At Risk (07/11/2023)  Tobacco Use: Low Risk  (07/10/2023)     Readmission Risk Interventions    07/11/2023    8:38 AM 06/30/2023   10:47 AM 05/21/2023   12:18 PM  Readmission Risk Prevention Plan  Transportation Screening Complete Complete Complete  Medication Review [employer]) Complete Complete Complete  HRI or Home Care Consult Complete Complete Complete  SW Recovery Care/Counseling Consult Complete Complete Complete  Palliative Care Screening Not Applicable Not Applicable Not Applicable  Skilled Nursing Facility Not Applicable Not Applicable Not Applicable

## 2023-07-14 NOTE — Discharge Summary (Signed)
Physician Discharge Summary   Patient: [name] MRN: [id number] DOB: Feb 22, 1966  Admit date:     07/10/2023  Discharge date: 07/14/23  Discharge Physician: [name] Nine   PCP: Patient, No Pcp Per   Recommendations at discharge:   Follow up with nephrology for routine HD/ESRD management.  Continue outpatient wound care, HHRN resumption order placed.   Discharge Diagnoses: Principal Problem:   ESRD on hemodialysis (HCC) Active Problems:   PNA (pneumonia)   Seizure disorder (HCC)   Essential hypertension   Paraplegia (HCC)   Pressure injury of skin  Hospital Course: [name] was admitted to the hospital with the working diagnosis of acute hypoxemic respiratory failure due to volume overload.   58 yo female with the past medical history of hypertension, ESRD on HD, paraplegia, encephalomalacia, wheelchair bound, suprapubic catheter due to neurogenic bladder, bilateral sacral decubitus ulcers, COPD and seizures who presented with dyspnea.  Recent hospitalization 01/14 to 01/15 due to volume overload and 02/01 to 07/01/23 for RSV related respiratory failure.  On the day of hospitalization EMS was called due to severe dyspnea, she was found with 02 saturation 88% with blood pressure 200/140, she was placed on non-re breather mask 10L and transported to the ED.  On her initial physical examination her blood pressure 156/76, HR 104, RR 22 and 02 saturation 96% on supplemental 02 per [place].  Lungs with no wheezing or rhonchi, heart with S1 and S2 present and regular, abdomen sofr and no lower extremity edema.   Na 134, K 4,4 Cl 100, bicarbonate 23 glucose 92 bun 42 cr 3,6  Wbc 29,2 hgb 12.2 plt 149  Sars covid 19 negative Influenza negative  Chest radiograph with cardiomegaly, bilateral hilar vascular congestion, bilateral small pleural effusions. Fluid in the right fissure.   CT chest with bilateral ground glass opacities, bilateral small pleural effusions with right and  left lower lobe compression atelectasis.   EKG 111 bpm, right axis deviation, right bundle branch block, sinus rhythm with no significant ST segment or T wave changes.   02/13 patient underwent HD with ultrafiltration, 3,900 ml removed.  02/15 HD today.  02/16 improvement in oxygenation and dyspnea.  02/17 HD back on schedule. She is not hypoxemic and has normal respiratory effort. WBC normalized, remains afebrile on oral antibiotics. Culture remain NGTD. She will continue dialysis on scheduled (MWF) and complete a course of antibiotics as outpatient.   Assessment and Plan: * ESRD on hemodialysis (HCC) Hyponatremia,  Volume overload related to end stage renal disease.  Improved volume status. Appears roughly euvolemic at discharge.   Her BUN is down to 32 from 64, K is 4,8 and serum bicarbonate at 28  Na 132.  P 4,8   She has responded well to aggressive inpatient ultrafiltration.  Oxygenation remains normal on room air.  Patient regular schedule is M-W -F.  Continue with furosemide 80 mg on non HD days.   Anemia of chronic renal disease. Cell count has been stable.   Metabolic bone disease. Continue with sevelamer and calcitriol.   PNA: RLL and LLL on CT with small right pleural effusion Wbc is down to 7.4  Blood cultures have been no growth.  She has been afebrile.   Plan to continue antibiotic therapy for a total of 5 days will change ceftriaxone to cefdinir.  Continue with airway clearing techniques with flutter valve and incentive spirometer.   Seizure disorder (HCC) No active seizure.  Continue with Keppra per home regimen, 1000 mg  at bedtime and extra 500 mg on HD days, Monday Wednesday and Friday.   Essential hypertension Continue blood pressure monitoring.  Continue with clonidine 0,1 mg tid.  - BP has improved overall with improvement in anasarca/hypervolemia.   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  Old records  personally reviewed wound care follow up 05/2023 with right ischium with no exposed bone and red granulation with biofilm. Left ischium with red granulation. No infection noted. Discontinued IV vancomycin.   Pressure injury of skin Stage 2 bilateral ischial tuberosities and coccyx: POA Continue local wound care.  - HHRN resumption orders placed at discharge.   Consultants: Nephrology, WOC Procedures performed: HD  Disposition: Home Diet recommendation:  Renal diet DISCHARGE MEDICATION: Allergies as of 07/14/2023       Reactions   Benadryl [diphenhydramine Hcl (sleep)] Hives   Daptomycin Hives   Linezolid Other (See Comments)   Patient self-discontinued treatment due to GI intolerance. Taking it along with moxifloxacin   Moxifloxacin Other (See Comments)   Patient self-discontinued treatment due to GI intolerance. Taking it along with linezolid   Quinine Derivatives Other (See Comments)   Alters mental status   Vancomycin Other (See Comments)   Pt is tolerating this medication at HD   Azithromycin Itching, Rash   Tetracycline Itching   Able to tolerate Doxycycline.    Zosyn [piperacillin Sod-tazobactam So] Rash        Medication List     STOP taking these medications    predniSONE 20 MG tablet Commonly known as: DELTASONE       TAKE these medications    acetaminophen 500 MG tablet Commonly known as: TYLENOL Take 1,000 mg by mouth every 6 (six) hours as needed for moderate pain.   albuterol (2.5 MG/3ML) 0.083% nebulizer solution Commonly known as: PROVENTIL Take 3 mLs (2.5 mg total) by nebulization every 4 (four) hours as needed for wheezing or shortness of breath.   amLODipine 5 MG tablet Commonly known as: NORVASC Take 1 tablet (5 mg total) by mouth daily. Take 1 tablet on Monday,Wednesday and Friday then take 2 tablets on Tuesday,Thursday,Saturday and Sunday.   carvedilol 12.5 MG tablet Commonly known as: COREG Take 1 tablet (12.5 mg total) by mouth 2  (two) times daily with a meal.   cefUROXime 250 MG tablet Commonly known as: CEFTIN Take 1 tablet (250 mg total) by mouth every Monday, Wednesday, and Friday at 6 PM for 2 doses.   cloNIDine 0.1 MG tablet Commonly known as: CATAPRES Take 1 tablet (0.1 mg total) by mouth 3 (three) times daily.   cyclobenzaprine 5 MG tablet Commonly known as: FLEXERIL Take 5 mg by mouth 3 (three) times daily as needed for muscle spasms.   furosemide 80 MG tablet Commonly known as: LASIX Take 80 mg by mouth 4 (four) times a week.   guaifenesin 100 MG/5ML syrup Commonly known as: ROBITUSSIN Take 5 mLs by mouth 3 (three) times daily as needed for cough or congestion.   levETIRAcetam 500 MG tablet Commonly known as: KEPPRA Take 2 tablets (1,000 mg total) by mouth every evening. Take an additional tablet on Monday,Wednesday and Friday evening after Dialysis   lidocaine-prilocaine cream Commonly known as: EMLA Apply 1 Application topically 3 (three) times a week.   pantoprazole 40 MG tablet Commonly known as: PROTONIX Take 1 tablet (40 mg total) by mouth daily.   senna-docusate 8.6-50 MG tablet Commonly known as: Senokot-S Take 2 tablets by mouth at bedtime.  sevelamer carbonate 800 MG tablet Commonly known as: RENVELA Take 800 mg by mouth 3 (three) times daily.        Follow-up Information     Mosetta Pigeon, MD Follow up.   Specialty: Nephrology Contact information: [address] [place] [address] [phone]         Care, [facility] Follow up.   Contact information: [address] [place] [address] [phone]                Discharge Exam: Ceasar Mons Weights   07/11/23 1605 07/12/23 1537 07/12/23 1915  Weight: 45.2 kg 45.9 kg 42.8 kg  No distress, chronically ill-appearing female RRR, no MRG Diminished at bases, but rhonchi previously documented have resolved. Soft, NT, ND No edema.   Bilateral stage IV pressure ulcers,  greater trochanters     Left   Right   Condition at discharge: stable  The results of significant diagnostics from this hospitalization (including imaging, microbiology, ancillary and laboratory) are listed below for reference.   Imaging Studies: CT Chest W Contrast Result Date: 07/10/2023 CLINICAL DATA:  Pneumonia, complication suspected, xray done EXAM: CT CHEST WITH CONTRAST TECHNIQUE: Multidetector CT imaging of the chest was performed during intravenous contrast administration. RADIATION DOSE REDUCTION: This exam was performed according to the departmental dose-optimization program which includes automated exposure control, adjustment of the mA and/or kV according to patient size and/or use of iterative reconstruction technique. CONTRAST:  80mL OMNIPAQUE IOHEXOL 300 MG/ML  SOLN COMPARISON:  CT 05/20/2023 FINDINGS: Cardiovascular: Moderately severe cardiomegaly. Small pericardial effusion. Thoracic aorta is nonaneurysmal. Central pulmonary vasculature is dilated. Mediastinum/Nodes: No enlarged mediastinal, hilar, or axillary lymph nodes. Thyroid gland, trachea, and esophagus demonstrate no significant findings. Lungs/Pleura: Near-complete atelectasis or consolidation of the right lower lobe and left lower lobe. Small right pleural effusion. Upper lung fields are clear. No pneumothorax. Upper Abdomen: No acute abnormality. Musculoskeletal: Chronic compression deformity of the T7 vertebral body with unchanged anterolisthesis and left lateral listhesis at T7-8. Prior thoracic fusion. Chronic healed sternal fracture. No new fractures. Anasarca. IMPRESSION: 1. Near-complete atelectasis or consolidation of the right lower lobe and left lower lobe. 2. Small right pleural effusion. 3. Moderately severe cardiomegaly with small pericardial effusion. 4. Dilated central pulmonary vasculature, suggesting pulmonary arterial hypertension. 5. Anasarca. Electronically Signed   By: Duanne Guess D.O.   On:  07/10/2023 08:37   DG Chest Port 1 View Result Date: 07/10/2023 CLINICAL DATA:  Shortness of breath EXAM: PORTABLE CHEST 1 VIEW COMPARISON:  06/28/2023 FINDINGS: Chronic cardiomegaly. Diffuse interstitial opacity with small right pleural effusion. Dense opacity behind the heart least partially related to cardiomegaly. Extensive thoracic fusion hardware. Chronic nonunited right clavicle fracture. IMPRESSION: 1. Interstitial edema and small right pleural effusion. 2. Retrocardiac opacification which could be pulmonary or from cardiomegaly, similar to prior. Electronically Signed   By: Tiburcio Pea M.D.   On: 07/10/2023 06:35   DG Chest Port 1 View Result Date: 06/28/2023 CLINICAL DATA:  Question of sepsis to evaluate for abnormality. Shortness of breath and headache starting today. EXAM: PORTABLE CHEST 1 VIEW COMPARISON:  06/10/2023 FINDINGS: Postoperative fixation of the thoracic spine. Cardiac enlargement. Mild central vascular congestion. Suggestion of small right pleural effusion with basilar atelectasis. This appears improved since prior study. No pneumothorax. Mediastinal contours appear intact. Calcification of the aorta. Old displaced fracture deformity of the right clavicle. IMPRESSION: 1. Cardiac enlargement. 2. Small right pleural effusion with basilar atelectasis or infiltration, improved since prior study.  Electronically Signed   By: Burman Nieves M.D.   On: 06/28/2023 20:48    Microbiology: Results for orders placed or performed during the hospital encounter of 07/10/23  Resp panel by RT-PCR (RSV, Flu A&B, Covid) Anterior Nasal Swab     Status: None   Collection Time: 07/10/23  7:30 AM   Specimen: Anterior Nasal Swab  Result Value Ref Range Status   SARS Coronavirus 2 by RT PCR NEGATIVE NEGATIVE Final    Comment: (NOTE) SARS-CoV-2 target nucleic acids are NOT DETECTED.  The SARS-CoV-2 RNA is generally detectable in upper respiratory specimens during the acute phase of infection.  The lowest concentration of SARS-CoV-2 viral copies this assay can detect is 138 copies/mL. A negative result does not preclude SARS-Cov-2 infection and should not be used as the sole basis for treatment or other patient management decisions. A negative result may occur with  improper specimen collection/handling, submission of specimen other than nasopharyngeal swab, presence of viral mutation(s) within the areas targeted by this assay, and inadequate number of viral copies(<138 copies/mL). A negative result must be combined with clinical observations, patient history, and epidemiological information. The expected result is Negative.  Fact Sheet for Patients:  BloggerCourse.com  Fact Sheet for Healthcare Providers:  SeriousBroker.it  This test is no t yet approved or cleared by the [place] FDA and  has been authorized for detection and/or diagnosis of SARS-CoV-2 by FDA under an Emergency Use Authorization (EUA). This EUA will remain  in effect (meaning this test can be used) for the duration of the COVID-19 declaration under Section 564(b)(1) of the Act, 21 U.S.C.section 360bbb-3(b)(1), unless the authorization is terminated  or revoked sooner.       Influenza A by PCR NEGATIVE NEGATIVE Final   Influenza B by PCR NEGATIVE NEGATIVE Final    Comment: (NOTE) The Xpert Xpress SARS-CoV-2/FLU/RSV plus assay is intended as an aid in the diagnosis of influenza from Nasopharyngeal swab specimens and should not be used as a sole basis for treatment. Nasal washings and aspirates are unacceptable for Xpert Xpress SARS-CoV-2/FLU/RSV testing.  Fact Sheet for Patients: BloggerCourse.com  Fact Sheet for Healthcare Providers: SeriousBroker.it  This test is not yet approved or cleared by the [place] FDA and has been authorized for detection and/or diagnosis of SARS-CoV-2 by FDA  under an Emergency Use Authorization (EUA). This EUA will remain in effect (meaning this test can be used) for the duration of the COVID-19 declaration under Section 564(b)(1) of the Act, 21 U.S.C. section 360bbb-3(b)(1), unless the authorization is terminated or revoked.     Resp Syncytial Virus by PCR NEGATIVE NEGATIVE Final    Comment: (NOTE) Fact Sheet for Patients: BloggerCourse.com  Fact Sheet for Healthcare Providers: SeriousBroker.it  This test is not yet approved or cleared by the [place] FDA and has been authorized for detection and/or diagnosis of SARS-CoV-2 by FDA under an Emergency Use Authorization (EUA). This EUA will remain in effect (meaning this test can be used) for the duration of the COVID-19 declaration under Section 564(b)(1) of the Act, 21 U.S.C. section 360bbb-3(b)(1), unless the authorization is terminated or revoked.  Performed at [facility], [address] [place], [place] [address]   Culture, blood (Routine X 2) w Reflex to ID Panel     Status: None (Preliminary result)   Collection Time: 07/10/23  9:38 AM   Specimen: BLOOD  Result Value Ref Range Status   Specimen Description BLOOD RIGHT ANTECUBITAL  Final   Special Requests  Final    BOTTLES DRAWN AEROBIC AND ANAEROBIC Blood Culture adequate volume   Culture   Final    NO GROWTH 4 DAYS Performed at [facility], [address], [place], [place] [address]    Report Status PENDING  Incomplete  Culture, blood (Routine X 2) w Reflex to ID Panel     Status: None (Preliminary result)   Collection Time: 07/10/23  9:49 AM   Specimen: BLOOD  Result Value Ref Range Status   Specimen Description BLOOD BLOOD RIGHT HAND  Final   Special Requests   Final    BOTTLES DRAWN AEROBIC AND ANAEROBIC Blood Culture adequate volume   Culture   Final    NO GROWTH 4 DAYS Performed at [facility], [address] [place], [place] [address]    Report Status  PENDING  Incomplete    Labs: CBC: Recent Labs  Lab 07/10/23 0549 07/11/23 0404 07/12/23 1654 07/13/23 0445  WBC 29.2* 16.0* 6.7 7.4  NEUTROABS 26.9*  --   --  4.8  HGB 12.2 11.4* 10.0* 10.9*  HCT 39.7 37.9 33.1* 37.7  MCV 91.5 92.4 92.5 93.5  PLT 149* 129* 158 163   Basic Metabolic Panel: Recent Labs  Lab 07/10/23 0549 07/11/23 0404 07/12/23 1654 07/13/23 0445  NA 134* 134* 133* 132*  K 4.4 4.2 4.8 4.8  CL 100 93* 93* 91*  CO2 23 30 25 28   GLUCOSE 92 100* 119* 93  BUN 42* 31* 64* 32*  CREATININE 3.66* 2.73* 5.02* 3.02*  CALCIUM 8.7* 8.5* 8.9 9.1  PHOS  --   --  5.0* 4.8*   Liver Function Tests: Recent Labs  Lab 07/11/23 0404 07/12/23 1654 07/13/23 0445  AST 15  --   --   ALT 14  --   --   ALKPHOS 79  --   --   BILITOT 0.8  --   --   PROT 6.9  --   --   ALBUMIN 3.0* 2.8* 2.9*   CBG: No results for input(s): "GLUCAP" in the last 168 hours.  Discharge time spent: greater than 30 minutes.  Signed: [name] Nine, MD [facility] 07/14/2023

## 2023-07-14 NOTE — Progress Notes (Signed)
   HEMODIALYSIS TREATMENT NOTE:  Uneventful 3 hour heparin-free treatment completed using left upper arm AVF (15g/antegrade). Goal met: 3.2 liters removed without interruption in UF.  All blood was returned and hemostasis was achieved in 10 minutes.  BP remains high despite aggressive UF.  Post-HD:  07/14/23 1825  Vitals  Temp 98.1 F (36.7 C)  Temp Source Axillary  BP (!) 173/92  MAP (mmHg) 115  BP Location Right Arm  BP Method Automatic  Patient Position (if appropriate) Lying  Pulse Rate 90  Pulse Rate Source Monitor  ECG Heart Rate 94  Resp 16  Oxygen Therapy  SpO2 98 %  O2 Device Room Air  Post Treatment  Dialyzer Clearance Lightly streaked  Hemodialysis Intake (mL) 0 mL  Liters Processed 72  Fluid Removed (mL) 3200 mL  Tolerated HD Treatment Yes  Post-Hemodialysis Comments Goal met  AVG/AVF Arterial Site Held (minutes) 5 minutes  AVG/AVF Venous Site Held (minutes) 5 minutes  Fistula / Graft Left Upper arm Arteriovenous fistula  No placement date or time found.   Placed prior to admission: Yes  Orientation: Left  Access Location: Upper arm  Access Type: Arteriovenous fistula  Fistula / Graft Assessment Thrill;Bruit  Status Patent    Arman Filter, RN AP [facility]

## 2023-07-14 NOTE — Plan of Care (Signed)
  Problem: Clinical Measurements: Goal: Ability to maintain a body temperature in the normal range will improve Outcome: Progressing   Problem: Respiratory: Goal: Ability to maintain adequate ventilation will improve Outcome: Progressing Goal: Ability to maintain a clear airway will improve Outcome: Progressing   Problem: Education: Goal: Knowledge of General Education information will improve Description: Including pain rating scale, medication(s)/side effects and non-pharmacologic comfort measures Outcome: Progressing   Problem: Health Behavior/Discharge Planning: Goal: Ability to manage health-related needs will improve Outcome: Progressing   Problem: Clinical Measurements: Goal: Ability to maintain clinical measurements within normal limits will improve Outcome: Progressing   Problem: Nutrition: Goal: Adequate nutrition will be maintained Outcome: Progressing   Problem: Coping: Goal: Level of anxiety will decrease Outcome: Progressing   Problem: Pain Managment: Goal: General experience of comfort will improve and/or be controlled Outcome: Progressing   Problem: Safety: Goal: Ability to remain free from injury will improve Outcome: Progressing

## 2023-07-15 ENCOUNTER — Ambulatory Visit: Payer: Self-pay | Admitting: Family Medicine

## 2023-07-15 ENCOUNTER — Telehealth: Payer: Self-pay | Admitting: *Deleted

## 2023-07-15 ENCOUNTER — Encounter: Payer: Self-pay | Admitting: *Deleted

## 2023-07-15 LAB — CULTURE, BLOOD (ROUTINE X 2)
Culture: NO GROWTH
Culture: NO GROWTH
Special Requests: ADEQUATE
Special Requests: ADEQUATE

## 2023-07-15 NOTE — Transitions of Care (Post Inpatient/ED Visit) (Signed)
07/15/2023  Name: [name] MRN: [id number] DOB: 1966-05-23  Today's TOC FU Call Status: Today's TOC FU Call Status:: Successful TOC FU Call Completed TOC FU Call Complete Date: 07/15/23 Patient's Name and Date of Birth confirmed.  Transition Care Management Follow-up Telephone Call Date of Discharge: 07/14/23 Discharge [name] Type of Discharge: Inpatient Admission Primary Inpatient Discharge Diagnosis:: ESRD on hemodialysis How have you been since you were released from the hospital?:  (pt states she is eating well, no issues with bowels/ attends dialysis M,W,F, home health assists w/ wound care, will be seeing new primary care doctor in March at [facility]) Any questions or concerns?: No  Items Reviewed: Did you receive and understand the discharge instructions provided?: Yes Medications obtained,verified, and reconciled?: Yes (Medications Reviewed) Any new allergies since your discharge?: No Dietary orders reviewed?: Yes Type of Diet Ordered:: renal diet Do you have support at home?: Yes People in Home: alone Name of Support/Comfort Primary Source: daughter [name] provides transportation as needed, adult son also assists,family assists w/ showering, cooking, wound care, etc Patient declines enrollment in TOC 30 day program, states she does not want any phone calls that she has a lot going on with dialysis, etc. Reviewed signs/ symptoms of infection, reportable signs/ symptoms of infection   Medications Reviewed Today: Medications Reviewed Today     Reviewed by Audrie Gallus, RN (Registered Nurse) on 07/15/23 at 1350  Med List Status: <None>   Medication Order Taking? Sig Documenting Provider Last Dose Status Informant  acetaminophen (TYLENOL) 500 MG tablet [id number] Yes Take 1,000 mg by mouth every 6 (six) hours as needed for moderate pain. [provider] Taking Active Child  albuterol (PROVENTIL) (2.5 MG/3ML) 0.083% nebulizer solution  [id number] Yes Take 3 mLs (2.5 mg total) by nebulization every 4 (four) hours as needed for wheezing or shortness of breath. Shon Hale, MD Taking Active Child  amLODipine (NORVASC) 5 MG tablet [id number] Yes Take 1 tablet (5 mg total) by mouth daily. Take 1 tablet on Monday,Wednesday and Friday then take 2 tablets on Tuesday,Thursday,Saturday and Sunday. Shon Hale, MD Taking Active Child  carvedilol (COREG) 12.5 MG tablet [id number] Yes Take 1 tablet (12.5 mg total) by mouth 2 (two) times daily with a meal. Emokpae, Courage, MD Taking Active Child  cefUROXime (CEFTIN) 250 MG tablet [id number] Yes Take 1 tablet (250 mg total) by mouth every Monday, Wednesday, and Friday at 6 PM for 2 doses. [name] Nine, MD Taking Active   cloNIDine (CATAPRES) 0.1 MG tablet [id number] Yes Take 1 tablet (0.1 mg total) by mouth 3 (three) times daily. Vassie Loll, MD Taking Active Child  cyclobenzaprine (FLEXERIL) 5 MG tablet [id number] Yes Take 5 mg by mouth 3 (three) times daily as needed for muscle spasms. [provider] Taking Active Child  furosemide (LASIX) 80 MG tablet [id number] Yes Take 80 mg by mouth 4 (four) times a week. [provider] Taking Active Child  guaifenesin (ROBITUSSIN) 100 MG/5ML syrup [id number] Yes Take 5 mLs by mouth 3 (three) times daily as needed for cough or congestion. [provider] Taking Active Child  levETIRAcetam (KEPPRA) 500 MG tablet [id number] Yes Take 2 tablets (1,000 mg total) by mouth every evening. Take an additional tablet on Monday,Wednesday and Friday evening after Dialysis Shon Hale, MD Taking Active Child  lidocaine-prilocaine (EMLA) cream [id number] Yes Apply 1 Application topically 3 (three) times a week. [provider] Taking Active Child  pantoprazole (PROTONIX) 40 MG tablet [id number] Yes Take  1 tablet (40 mg total) by mouth daily. Sherryll Burger, Pratik D, DO Taking Active Child  senna-docusate (SENOKOT-S) 8.6-50 MG tablet  [id number] Yes Take 2 tablets by mouth at bedtime. Shon Hale, MD Taking Active Child  sevelamer carbonate (RENVELA) 800 MG tablet [id number] Yes Take 800 mg by mouth 3 (three) times daily. [provider] Taking Active Child           Med Note Elesa Massed, Johnnette Litter Apr 13, 2023  7:04 PM)    Med List Note Haywood Lasso, CPhT 05/20/23 2040): Daughter's [name] [phone] &  [phone] ) Dialysis Monday, Wednesday, Friday - [address] [place]** Daughter is the best historian for medication history per patient Son Loel Ro [phone])             Home Care and Equipment/Supplies: Were Home Health Services Ordered?: Yes Name of Home Health Agency:: [facility]- ongoing care of pt,  pt states " they will be seeing me tomorrow, they do wound care" Has Agency set up a time to come to your home?: No EMR reviewed for Home Health Orders:  (to resume care 07/16/23 per pt) Any new equipment or medical supplies ordered?: No  Functional Questionnaire: Do you need assistance with bathing/showering or dressing?:  (adult children assist) Do you need assistance with meal preparation?: Yes Do you need assistance with eating?: No Do you have difficulty maintaining continence: No Do you need assistance with getting out of bed/getting out of a chair/moving?: Yes (in WC all the time) Do you have difficulty managing or taking your medications?: No  Follow up appointments reviewed: PCP Follow-up appointment confirmed?: Yes Date of PCP follow-up appointment?: 07/31/23 Follow-up Provider: Vevelyn Francois at [facility] (pt is new pt at this practice) Specialist Hospital Follow-up appointment confirmed?: No (pt states she will call and make appt w/ nephrologist Mosetta Pigeon) Reason Specialist Follow-Up Not Confirmed: Patient has Specialist Provider Number and will Call for Appointment Do you need transportation to your follow-up appointment?: No (pt uses [employer] and family assists also) Do you understand care options if your condition(s) worsen?: Yes-patient verbalized understanding  SDOH Interventions Today    Flowsheet Row Most Recent Value  SDOH Interventions   Food Insecurity Interventions Intervention Not Indicated  Housing Interventions Intervention Not Indicated  Transportation Interventions Intervention Not Indicated  Utilities Interventions Intervention Not Indicated       Irving Shows [facility], BSN RN Care Manager/ Transition of Care [facility] Population Health [phone]

## 2023-07-31 ENCOUNTER — Encounter: Payer: Self-pay | Admitting: Family Medicine

## 2023-07-31 ENCOUNTER — Ambulatory Visit (INDEPENDENT_AMBULATORY_CARE_PROVIDER_SITE_OTHER): Payer: 59 | Admitting: Family Medicine

## 2023-07-31 VITALS — BP 192/96 | HR 79 | Ht 62.0 in | Wt 95.0 lb

## 2023-07-31 DIAGNOSIS — I10 Essential (primary) hypertension: Secondary | ICD-10-CM | POA: Diagnosis not present

## 2023-07-31 DIAGNOSIS — E038 Other specified hypothyroidism: Secondary | ICD-10-CM

## 2023-07-31 DIAGNOSIS — I161 Hypertensive emergency: Secondary | ICD-10-CM | POA: Diagnosis not present

## 2023-07-31 DIAGNOSIS — N186 End stage renal disease: Secondary | ICD-10-CM

## 2023-07-31 DIAGNOSIS — R7301 Impaired fasting glucose: Secondary | ICD-10-CM

## 2023-07-31 DIAGNOSIS — E7849 Other hyperlipidemia: Secondary | ICD-10-CM

## 2023-07-31 DIAGNOSIS — Z1211 Encounter for screening for malignant neoplasm of colon: Secondary | ICD-10-CM

## 2023-07-31 DIAGNOSIS — G8220 Paraplegia, unspecified: Secondary | ICD-10-CM

## 2023-07-31 DIAGNOSIS — Z992 Dependence on renal dialysis: Secondary | ICD-10-CM

## 2023-07-31 DIAGNOSIS — E559 Vitamin D deficiency, unspecified: Secondary | ICD-10-CM

## 2023-07-31 DIAGNOSIS — R29898 Other symptoms and signs involving the musculoskeletal system: Secondary | ICD-10-CM

## 2023-07-31 NOTE — Patient Instructions (Addendum)
 I appreciate the opportunity to provide care to you today!    Follow up:  4 months  Labs: please stop by the lab during the week to get your blood drawn (TSH, Lipid profile, HgA1c, Vit D)  Long-Term Considerations: Uncontrolled hypertension can increase the risk of cardiovascular diseases, including stroke, coronary artery disease, and heart failure.  Please report to the emergency department if your blood pressure exceeds 180/120 and is accompanied by symptoms such as headaches, chest pain, palpitations, blurred vision, or dizziness.   Schedule medicare annual wellness  Referrals today- Advanced hypertension Clinic, PT, GI for colon cancer screening  Attached with your AVS, you will find valuable resources for self-education. I highly recommend dedicating some time to thoroughly examine them.   Please continue to a heart-healthy diet and increase your physical activities. Try to exercise for at least five days a week.    It was a pleasure to see you and I look forward to continuing to work together on your health and well-being. Please do not hesitate to call the office if you need care or have questions about your care.  In case of emergency, please visit the Emergency Department for urgent care, or contact our clinic at [phone] to schedule an appointment. We're here to help you!   Have a wonderful day and week. With Gratitude, Gilmore Laroche MSN, FNP-BC

## 2023-07-31 NOTE — Progress Notes (Signed)
 New Patient Office Visit  Subjective:  Patient ID: [name], female    DOB: 1966-05-16  Age: 58 y.o. MRN: [id number]  CC:  Chief Complaint  Patient presents with   Establish Care    HPI [name] is a 57 y.o. female with past medical history of  ESRD, hypertensive emergency, paraplegia, seizure disorder, TBI presents for establishing care.  Hypertensive Emergency: The patient is in the clinic today with her daughter, who reports that the patient's ambulatory blood pressure readings consistently range into the 200s systolic and greater than 100 diastolic. She is concerned that her mother may be at risk for a stroke with blood pressure readings that high. The patient's hypertension is complicated by end-stage renal disease. She is currently asymptomatic in the clinic and reports adherence to her prescribed medications, which include amlodipine 5 mg daily, furosemide 80 mg four times daily, carvedilol 12.5 mg twice daily, clonidine 0.1 mg twice daily, and irbesartan 150 mg daily.  ESRD: The patient undergoes dialysis three days a week on Mondays, Wednesdays, and Fridays.  Paraplegia: The patient is paralyzed from the waist down and reports weakness and stiffness in her lower extremities.   Past Medical History:  Diagnosis Date   Abnormal uterine bleeding (AUB) 06/15/2014   Cancer (HCC)    uterine   High blood pressure    Paraplegia (lower)    Renal disorder    Seizure disorder (HCC)    Seizures (HCC)    Suprapubic catheter (HCC)    Urinary tract infection     Past Surgical History:  Procedure Laterality Date   APPLICATION OF WOUND VAC Right 09/13/2021   (approximately 1-11mos ago) pressure sore on right hip   BACK SURGERY     Pt stated "before 2000"   BIOPSY  12/03/2022   Procedure: BIOPSY;  Surgeon: Dolores Frame, MD;  [facility]: [facility];  Service: Gastroenterology;;   ESOPHAGOGASTRODUODENOSCOPY N/A 09/20/2015   Procedure:  ESOPHAGOGASTRODUODENOSCOPY (EGD);  Surgeon: Malissa Hippo, MD;  Location: [facility];  Service: Endoscopy;  Laterality: N/A;  730   ESOPHAGOGASTRODUODENOSCOPY (EGD) WITH PROPOFOL N/A 12/03/2022   Procedure: ESOPHAGOGASTRODUODENOSCOPY (EGD) WITH PROPOFOL;  Surgeon: Dolores Frame, MD;  Location: [facility];  Service: Gastroenterology;  Laterality: N/A;  1:15 pm, asa 3, pt knows to arrive at 10:30  dialysis pt, M,W & F   IR CATHETER TUBE CHANGE  04/02/2018   PERCUTANEOUS ENDOSCOPIC GASTROSTOMY (PEG) REMOVAL N/A 09/20/2015   Procedure: PERCUTANEOUS ENDOSCOPIC GASTROSTOMY (PEG) REMOVAL;  Surgeon: Malissa Hippo, MD;  [facility];  Service: Endoscopy;  Laterality: N/A;   TEE WITHOUT CARDIOVERSION N/A 11/11/2022   Procedure: TRANSESOPHAGEAL ECHOCARDIOGRAM (TEE);  Surgeon: Pricilla Riffle, MD;  Location: [facility];  Service: Cardiovascular;  Laterality: N/A;    Family History  Problem Relation Age of Onset   Cancer Mother    Hypertension Mother    Cancer Sister        breast and then spread everywhere.   Diabetes Paternal Grandmother    Hypertension Paternal Grandmother     Social History   Socioeconomic History   Marital status: Single    Spouse name: Not on file   Number of children: 3   Years of education: 9 th   Highest education level: Not on file  Occupational History    Comment: Disabled  Tobacco Use   Smoking status: Never   Smokeless tobacco: Never  Vaping Use   Vaping status: Never Used  Substance and Sexual Activity   Alcohol use: Yes    Comment: Occassionally   Drug use: No   Sexual activity: Never  Other Topics Concern   Not on file  Social History Narrative   Patient lives with her son Vicente Serene). Patient is disabled.   Education 9th grade.   Right handed.   Caffeine - None    Social Drivers of Corporate investment banker Strain: Not on file  Food Insecurity: No Food Insecurity (07/15/2023)   Hunger Vital Sign    Worried About  Running Out of Food in the Last Year: Never true    Ran Out of Food in the Last Year: Never true  Transportation Needs: No Transportation Needs (07/15/2023)   PRAPARE - Administrator, Civil Service (Medical): No    Lack of Transportation (Non-Medical): No  Physical Activity: Not on file  Stress: Not on file  Social Connections: Not on file  Intimate Partner Violence: Not At Risk (07/15/2023)   Humiliation, Afraid, Rape, and Kick questionnaire    Fear of Current or Ex-Partner: No    Emotionally Abused: No    Physically Abused: No    Sexually Abused: No    ROS Review of Systems  Constitutional:  Negative for chills and fever.  Eyes:  Negative for visual disturbance.  Respiratory:  Negative for chest tightness and shortness of breath.   Neurological:  Negative for dizziness and headaches.    Objective:   Today's Vitals: BP (!) 192/96   Pulse 79   Ht 5\' 2"  (1.575 m)   Wt 95 lb (43.1 kg) Comment: pt reported  LMP  (LMP Unknown)   SpO2 93%   BMI 17.38 kg/m   Physical Exam HENT:     Head: Normocephalic.     Mouth/Throat:     Mouth: Mucous membranes are moist.  Cardiovascular:     Rate and Rhythm: Normal rate.     Heart sounds: Normal heart sounds.  Pulmonary:     Effort: Pulmonary effort is normal.     Breath sounds: Normal breath sounds.  Neurological:     Mental Status: She is alert.      Assessment & Plan:   Hypertensive emergency Assessment & Plan: Uncontrolled blood pressure in the clinic, complicated by end-stage renal disease. Encouraged to continue her current treatment regimen, and a referral will be placed to the hypertensive clinic for collaborative care.  A low-sodium diet of less than 2,300 mg daily is recommended.  Long-term considerations were discussed, emphasizing that uncontrolled hypertension increases the risk of cardiovascular diseases, including stroke, coronary artery disease, and heart failure.  The patient is encouraged to seek  emergency care if blood pressure exceeds 180/120 and is accompanied by symptoms such as headaches, chest pain, palpitations, blurred vision, or dizziness. She verbalized understanding and will follow up as scheduled.    BP Readings from Last 3 Encounters:  07/31/23 (!) 192/96  07/14/23 (!) 190/105  07/01/23 (!) 157/78     Orders: -     Ambulatory referral to [facility]  Essential hypertension -     Ambulatory referral to [facility]  ESRD on hemodialysis [facility]) Assessment & Plan: Continue dialysis   Paraplegia [facility]) Assessment & Plan: Referral placed to PT for strengthening and mobility training, contracture prevention, and circulatory health.   Orders: -     Ambulatory referral to Physical Therapy  Weakness of both lower extremities -     Ambulatory referral to Physical  Therapy  Colon cancer screening -     Ambulatory referral to Gastroenterology  IFG (impaired fasting glucose) -     Hemoglobin A1c  Vitamin D deficiency -     VITAMIN D 25 Hydroxy (Vit-D Deficiency, Fractures)  TSH (thyroid-stimulating hormone deficiency) -     TSH + free T4  Other hyperlipidemia -     Lipid panel  Note: This chart has been completed using Engineer, civil (consulting) software, and while attempts have been made to ensure accuracy, certain words and phrases may not be transcribed as intended.     Follow-up: Return in about 4 months (around 11/30/2023).   Gilmore Laroche, FNP

## 2023-08-03 NOTE — Assessment & Plan Note (Signed)
 Uncontrolled blood pressure in the clinic, complicated by end-stage renal disease. Encouraged to continue her current treatment regimen, and a referral will be placed to the hypertensive clinic for collaborative care.  A low-sodium diet of less than 2,300 mg daily is recommended.  Long-term considerations were discussed, emphasizing that uncontrolled hypertension increases the risk of cardiovascular diseases, including stroke, coronary artery disease, and heart failure.  The patient is encouraged to seek emergency care if blood pressure exceeds 180/120 and is accompanied by symptoms such as headaches, chest pain, palpitations, blurred vision, or dizziness. She verbalized understanding and will follow up as scheduled.    BP Readings from Last 3 Encounters:  07/31/23 (!) 192/96  07/14/23 (!) 190/105  07/01/23 (!) 157/78

## 2023-08-03 NOTE — Assessment & Plan Note (Signed)
 Continue dialysis

## 2023-08-03 NOTE — Assessment & Plan Note (Addendum)
 Referral placed to PT for strengthening and mobility training, contracture prevention, and circulatory health.

## 2023-08-05 ENCOUNTER — Telehealth: Payer: Self-pay | Admitting: Family Medicine

## 2023-08-05 NOTE — Telephone Encounter (Signed)
 Copied from CRM [id number]. Topic: Clinical - Medication Question >> Aug 05, 2023  9:00 AM Higinio Roger wrote: Reason for CRM: Patient wants to know if she should continue taking the following:  cefUROXime (CEFTIN) 250 MG tablet irbesartan (AVAPRO) 150 MG tablet carvedilol (COREG) 12.5 MG tablet pantoprazole (PROTONIX) 40 MG tablet   Callback #: [phone]

## 2023-08-07 ENCOUNTER — Other Ambulatory Visit: Payer: Self-pay | Admitting: Family Medicine

## 2023-08-07 ENCOUNTER — Encounter (INDEPENDENT_AMBULATORY_CARE_PROVIDER_SITE_OTHER): Payer: Self-pay | Admitting: *Deleted

## 2023-08-07 DIAGNOSIS — K219 Gastro-esophageal reflux disease without esophagitis: Secondary | ICD-10-CM

## 2023-08-07 DIAGNOSIS — I10 Essential (primary) hypertension: Secondary | ICD-10-CM

## 2023-08-07 MED ORDER — PANTOPRAZOLE SODIUM 40 MG PO TBEC: 40.0000 mg | DELAYED_RELEASE_TABLET | Freq: Every day | ORAL | 0 refills | Status: DC

## 2023-08-07 MED ORDER — IRBESARTAN 150 MG PO TABS: 150.0000 mg | ORAL_TABLET | Freq: Every day | ORAL | 1 refills | Status: DC

## 2023-08-07 MED ORDER — CARVEDILOL 12.5 MG PO TABS: 12.5000 mg | ORAL_TABLET | Freq: Two times a day (BID) | ORAL | 3 refills | Status: DC

## 2023-08-28 NOTE — Telephone Encounter (Signed)
 Copied from CRM [id number]. Topic: Referral - Question >> Aug 27, 2023  3:22 PM Carlatta H wrote: Reason for CRM: Patient was referred to Dr Luan Moore but his office is no longer open//Please refer patient to another gastroenterologist//

## 2023-08-29 NOTE — Telephone Encounter (Signed)
 Patient was referred to [facility]enterology at [facility] (The Office Dr. Karilyn Cota retired from) but Office remains open and they have 3 other providers who are seeing Patient's at this time :)

## 2023-08-30 ENCOUNTER — Other Ambulatory Visit: Payer: Self-pay | Admitting: Family Medicine

## 2023-08-30 DIAGNOSIS — K219 Gastro-esophageal reflux disease without esophagitis: Secondary | ICD-10-CM

## 2023-09-02 ENCOUNTER — Other Ambulatory Visit: Payer: Self-pay

## 2023-09-02 ENCOUNTER — Encounter (HOSPITAL_COMMUNITY): Payer: Self-pay

## 2023-09-02 ENCOUNTER — Ambulatory Visit (HOSPITAL_COMMUNITY): Attending: Family Medicine

## 2023-09-02 DIAGNOSIS — R29898 Other symptoms and signs involving the musculoskeletal system: Secondary | ICD-10-CM | POA: Insufficient documentation

## 2023-09-02 DIAGNOSIS — R29818 Other symptoms and signs involving the nervous system: Secondary | ICD-10-CM | POA: Insufficient documentation

## 2023-09-02 DIAGNOSIS — G8220 Paraplegia, unspecified: Secondary | ICD-10-CM | POA: Diagnosis not present

## 2023-09-02 DIAGNOSIS — Z7409 Other reduced mobility: Secondary | ICD-10-CM | POA: Diagnosis present

## 2023-09-02 DIAGNOSIS — R208 Other disturbances of skin sensation: Secondary | ICD-10-CM | POA: Insufficient documentation

## 2023-09-02 DIAGNOSIS — Z789 Other specified health status: Secondary | ICD-10-CM | POA: Insufficient documentation

## 2023-09-02 NOTE — Telephone Encounter (Signed)
Pt informed
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
## 2023-09-17 ENCOUNTER — Ambulatory Visit: Payer: Self-pay
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
## 2023-09-18 ENCOUNTER — Encounter (HOSPITAL_BASED_OUTPATIENT_CLINIC_OR_DEPARTMENT_OTHER): Payer: Self-pay | Admitting: Family

## 2023-09-18 ENCOUNTER — Ambulatory Visit (HOSPITAL_BASED_OUTPATIENT_CLINIC_OR_DEPARTMENT_OTHER): Admitting: Family

## 2023-09-18 ENCOUNTER — Other Ambulatory Visit: Payer: Self-pay | Admitting: Family Medicine

## 2023-09-18 VITALS — BP 172/80 | HR 78 | Ht 62.0 in | Wt 95.0 lb

## 2023-09-18 DIAGNOSIS — I10 Essential (primary) hypertension: Secondary | ICD-10-CM

## 2023-09-18 DIAGNOSIS — N186 End stage renal disease: Secondary | ICD-10-CM

## 2023-09-18 DIAGNOSIS — Z992 Dependence on renal dialysis: Secondary | ICD-10-CM

## 2023-09-18 MED ORDER — CLONIDINE 0.1 MG/24HR TD PTWK: 0.1000 mg | MEDICATED_PATCH | TRANSDERMAL | 12 refills | Status: DC

## 2023-09-18 NOTE — Progress Notes (Signed)
 [facility] Initial Assessment:    Date:  09/18/2023   ID:  Vidya Bamford, DOB 1965-06-19, MRN [id number]  PCP:  [name], [name], FNP  Cardiologist:  None  Nephrologist:   Referring MD: [name], [name], FNP   CC: Hypertension  History of Present Illness:    [name] is a 58 y.o. female with a hx of ESRD on HD, seizure disorder, HTN, paraplegia here to establish care in the [facility].   TEE 10/2022 performed due to bacteremia small PFO, small pericardial effusion, minimal plaquing to thoracic aorta, trace MR/TR, no vegetation.  Echocardiogram 03/2023 LVEF 45-50%, mild LVH, gr2dd, RV SF mildly reduced, RV mildly enlarged, bilateral atria mildly dilated, mild MR.  Admitted 2/13 - 07/14/2023 with respiratory failure in the setting of pneumonia treated with antibiotics  She saw PCP 07/31/2023 noting persistent elevated BP ranging 200s over 100s.  Regimen include amlodipine  5 mg daily, furosemide  80 mg 4 times a day, carvedilol  12.5 mg twice daily, clonidine  0.1 mg twice daily, irbesartan  150 mg daily.  Presents today with her son to establish care. Hypertension dates back many years. Reports BP has been overall okay at HD but cannot recall specific readings. No recent home readings. Rarely has to pause treatment due to hypotension. HD schedule:  M/W/F picked up around 9:30-10a starts 10:3-11P gets home at 2:30p-3P [facility] in [place] with initial HD 5 years ago. Does still make some urine and takes Lasix  80mg  on non-HD days. Using nebulizer with improvement in her breathing. No chest pain, edema, orthopnea, PND.   Previous antihypertensives:  Past Medical History:  Diagnosis Date   Abnormal uterine bleeding (AUB) 06/15/2014   Cancer (HCC)    uterine   High blood pressure    Paraplegia (lower)    Renal disorder    Seizure disorder (HCC)    Seizures (HCC)    Suprapubic catheter (HCC)    Urinary tract infection     Past Surgical  History:  Procedure Laterality Date   APPLICATION OF WOUND VAC Right 09/13/2021   (approximately 1-52mos ago) pressure sore on right hip   BACK SURGERY     Pt stated "before 2000"   BIOPSY  12/03/2022   Procedure: BIOPSY;  Surgeon: Urban Garden, MD;  [facility];  Service: Gastroenterology;;   ESOPHAGOGASTRODUODENOSCOPY N/A 09/20/2015   Procedure: ESOPHAGOGASTRODUODENOSCOPY (EGD);  Surgeon: Ruby Corporal, MD;  [facility];  Service: Endoscopy;  Laterality: N/A;  730   ESOPHAGOGASTRODUODENOSCOPY (EGD) WITH PROPOFOL  N/A 12/03/2022   Procedure: ESOPHAGOGASTRODUODENOSCOPY (EGD) WITH PROPOFOL ;  Surgeon: Urban Garden, MD;  [facility];  Service: Gastroenterology;  Laterality: N/A;  1:15 pm, asa 3, pt knows to arrive at 10:30  dialysis pt, M,W & F   IR CATHETER TUBE CHANGE  04/02/2018   PERCUTANEOUS ENDOSCOPIC GASTROSTOMY (PEG) REMOVAL N/A 09/20/2015   Procedure: PERCUTANEOUS ENDOSCOPIC GASTROSTOMY (PEG) REMOVAL;  Surgeon: Ruby Corporal, MD;  [facility];  Service: Endoscopy;  Laterality: N/A;   TEE WITHOUT CARDIOVERSION N/A 11/11/2022   Procedure: TRANSESOPHAGEAL ECHOCARDIOGRAM (TEE);  Surgeon: Elmyra Haggard, MD;  Location: [facility];  Service: Cardiovascular;  Laterality: N/A;    Current Medications: Current Meds  Medication Sig   acetaminophen  (TYLENOL ) 500 MG tablet Take 1,000 mg by mouth every 6 (six) hours as needed for moderate pain.   albuterol  (PROVENTIL ) (2.5 MG/3ML) 0.083% nebulizer solution Take 3 mLs (2.5 mg total) by nebulization every 4 (four) hours as  needed for wheezing or shortness of breath.   amLODipine  (NORVASC ) 5 MG tablet Take 1 tablet (5 mg total) by mouth daily. Take 1 tablet on Monday,Wednesday and Friday then take 2 tablets on Tuesday,Thursday,Saturday and Sunday.   carvedilol  (COREG ) 12.5 MG tablet Take 1 tablet (12.5 mg total) by mouth 2 (two) times daily with a meal.   cefUROXime  (CEFTIN ) 250 MG  tablet Take 250 mg by mouth 2 (two) times daily with a meal. Take 1 tablet by mouth every MWF at 6pm for 2 doses   cloNIDine  (CATAPRES ) 0.1 MG tablet Take 1 tablet (0.1 mg total) by mouth 3 (three) times daily.   cyclobenzaprine  (FLEXERIL ) 5 MG tablet Take 5 mg by mouth 3 (three) times daily as needed for muscle spasms.   furosemide  (LASIX ) 80 MG tablet Take 80 mg by mouth 4 (four) times a week.   irbesartan  (AVAPRO ) 150 MG tablet Take 1 tablet (150 mg total) by mouth daily. Take 150 mg by mouth daily.   levETIRAcetam  (KEPPRA ) 500 MG tablet Take 2 tablets (1,000 mg total) by mouth every evening. Take an additional tablet on Monday,Wednesday and Friday evening after Dialysis   lidocaine -prilocaine  (EMLA ) cream Apply 1 Application topically 3 (three) times a week.   pantoprazole  (PROTONIX ) 40 MG tablet Take 1 tablet (40 mg total) by mouth daily.   senna-docusate (SENOKOT-S) 8.6-50 MG tablet Take 2 tablets by mouth at bedtime.   sevelamer  carbonate (RENVELA ) 800 MG tablet Take 800 mg by mouth 3 (three) times daily.     Allergies:   Benadryl [diphenhydramine hcl (sleep)], Daptomycin, Linezolid, Moxifloxacin, Quinine derivatives, Vancomycin , Azithromycin , Tetracycline, and Zosyn  [piperacillin  sod-tazobactam so]   Social History   Socioeconomic History   Marital status: Single    Spouse name: Not on file   Number of children: 3   Years of education: 9 th   Highest education level: Not on file  Occupational History    Comment: Disabled  Tobacco Use   Smoking status: Never   Smokeless tobacco: Never  Vaping Use   Vaping status: Never Used  Substance and Sexual Activity   Alcohol use: Yes    Comment: Occassionally   Drug use: No   Sexual activity: Never  Other Topics Concern   Not on file  Social History Narrative   Patient lives with her son Coye Diver). Patient is disabled.   Education 9th grade.   Right handed.   Caffeine - None    Social Drivers of Corporate investment banker  Strain: Not on file  Food Insecurity: No Food Insecurity (07/15/2023)   Hunger Vital Sign    Worried About Running Out of Food in the Last Year: Never true    Ran Out of Food in the Last Year: Never true  Transportation Needs: No Transportation Needs (07/15/2023)   PRAPARE - Administrator, Civil Service (Medical): No    Lack of Transportation (Non-Medical): No  Physical Activity: Not on file  Stress: Not on file  Social Connections: Not on file     Family History: The patient's family history includes Cancer in her mother and sister; Diabetes in her paternal grandmother; Hypertension in her mother and paternal grandmother.  ROS:   Please see the history of present illness.     All other systems reviewed and are negative.  EKGs/Labs/Other Studies Reviewed:         Recent Labs: 06/10/2023: B Natriuretic Peptide >4,500.0 06/29/2023: Magnesium  2.0 07/11/2023: ALT 14 07/14/2023: BUN 57; Creatinine,  Ser 4.62; Hemoglobin 10.3; Platelets 167; Potassium 5.5; Sodium 131   Recent Lipid Panel    Component Value Date/Time   CHOL 384 (H) 05/02/2014 2110   TRIG 412 (H) 05/02/2014 2110   HDL 42 05/02/2014 2110   CHOLHDL 9.1 05/02/2014 2110   VLDL UNABLE TO CALCULATE IF TRIGLYCERIDE OVER 400 mg/dL 04/54/0981 1914   LDLCALC UNABLE TO CALCULATE IF TRIGLYCERIDE OVER 400 mg/dL 78/29/5621 3086    Physical Exam:   VS:  BP (!) 172/80 (BP Location: Right Arm, Patient Position: Sitting, Cuff Size: Normal)   Pulse 78   Ht 5\' 2"  (1.575 m)   Wt 95 lb (43.1 kg)   LMP  (LMP Unknown)   SpO2 93%   BMI 17.38 kg/m  , BMI Body mass index is 17.38 kg/m. GENERAL:  Well appearing HEENT: Pupils equal round and reactive, fundi not visualized, oral mucosa unremarkable NECK:  No jugular venous distention, waveform within normal limits, carotid upstroke brisk and symmetric, no bruits, no thyromegaly LYMPHATICS:  No cervical adenopathy LUNGS:  Clear to auscultation bilaterally HEART:  RRR.  PMI not  displaced or sustained,S1 and S2 within normal limits, no S3, no S4, no clicks, no rubs, no murmurs ABD:  Flat, positive bowel sounds normal in frequency in pitch, no bruits, no rebound, no guarding, no midline pulsatile mass, no hepatomegaly, no splenomegaly EXT:  2 plus pulses throughout, no edema, no cyanosis no clubbing SKIN:  No rashes no nodules NEURO:  Cranial nerves II through XII grossly intact, motor grossly intact throughout PSYCH:  Cognitively intact, oriented to person place and time   ASSESSMENT/PLAN:    HTN - BP not at goal <130/80. Likely related to ESRD which additionally limits medication choices. She does still make some urine and takes Lasix  80mg  on non-HD days. Does not recall specific BP readings at HD and has not been checking at home. Continue Amlodipine  5mg  on M/W/F and 10mg  on T/R/Sa/Su,  Coreg  12.5mg  BID, Irbesartan  150mg  daily, lasix  80mg  daily on non-HD days.  Stop Clonidine  tablet. Start Clonidine  0.1mg  weekly patch.  Future considerations include increased dose Coreg  or addition of Hydralazine . Per her goal, would like to simplify medication regimen.   ESRD on HD - ESRD M/W/F. Follows with nephrology at HD center.  Screening for Secondary Hypertension:     Relevant Labs/Studies:    Latest Ref Rng & Units 07/14/2023    3:36 PM 07/13/2023    4:45 AM 07/12/2023    4:54 PM  Basic Labs  Sodium 135 - 145 mmol/L 131  132  133   Potassium 3.5 - 5.1 mmol/L 5.5  4.8  4.8   Creatinine 0.44 - 1.00 mg/dL 5.78  4.69  6.29        Latest Ref Rng & Units 06/22/2016    1:47 PM 05/24/2013   10:29 AM  Thyroid   TSH 0.350 - 4.500 uIU/mL 6.183  1.807              Latest Ref Rng & Units 05/21/2013    6:06 PM  Cortisol  Cortisol  ug/dL 52.8         Disposition:    FU with MD/APP/PharmD in 6 weeks    Medication Adjustments/Labs and Tests Ordered: Current medicines are reviewed at length with the patient today.  Concerns regarding medicines are outlined above.   No orders of the defined types were placed in this encounter.  No orders of the defined types were placed in this encounter.  Signed, Clearnce Curia, NP  09/18/2023 1:11 PM    [facility]

## 2023-09-18 NOTE — Patient Instructions (Signed)
 Medication Instructions:  Your physician has recommended you make the following change in your medication:   Clonidine  0.1mg  patch    Follow-Up: Please follow June 3rd @ 1:30pm with Kristin Alvstad at [facility]

## 2023-09-18 NOTE — Telephone Encounter (Signed)
 Please clarify the following:Location of the pain, Pain severity on a scale of 0 to 10 and Any factors that aggravate or alleviate the pain

## 2023-09-22 ENCOUNTER — Encounter (HOSPITAL_BASED_OUTPATIENT_CLINIC_OR_DEPARTMENT_OTHER): Payer: Self-pay | Admitting: Family

## 2023-09-22 ENCOUNTER — Emergency Department (HOSPITAL_COMMUNITY)

## 2023-09-22 ENCOUNTER — Inpatient Hospital Stay (HOSPITAL_COMMUNITY)
Admission: EM | Admit: 2023-09-22 | Discharge: 2023-09-25 | DRG: 189 | Disposition: A | Discharge status: Home Health | Attending: Family Medicine | Admitting: Family Medicine

## 2023-09-22 ENCOUNTER — Other Ambulatory Visit: Payer: Self-pay

## 2023-09-22 ENCOUNTER — Encounter (HOSPITAL_COMMUNITY): Payer: Self-pay | Admitting: Family Medicine

## 2023-09-22 ENCOUNTER — Other Ambulatory Visit: Payer: Self-pay | Admitting: Family Medicine

## 2023-09-22 ENCOUNTER — Encounter (HOSPITAL_COMMUNITY): Payer: Self-pay | Admitting: *Deleted

## 2023-09-22 DIAGNOSIS — G8220 Paraplegia, unspecified: Secondary | ICD-10-CM | POA: Diagnosis present

## 2023-09-22 DIAGNOSIS — I120 Hypertensive chronic kidney disease with stage 5 chronic kidney disease or end stage renal disease: Secondary | ICD-10-CM | POA: Diagnosis present

## 2023-09-22 DIAGNOSIS — L89154 Pressure ulcer of sacral region, stage 4: Secondary | ICD-10-CM | POA: Diagnosis present

## 2023-09-22 DIAGNOSIS — Z8249 Family history of ischemic heart disease and other diseases of the circulatory system: Secondary | ICD-10-CM | POA: Diagnosis not present

## 2023-09-22 DIAGNOSIS — N186 End stage renal disease: Secondary | ICD-10-CM | POA: Diagnosis present

## 2023-09-22 DIAGNOSIS — L8990 Pressure ulcer of unspecified site, unspecified stage: Secondary | ICD-10-CM | POA: Diagnosis present

## 2023-09-22 DIAGNOSIS — Z7401 Bed confinement status: Secondary | ICD-10-CM

## 2023-09-22 DIAGNOSIS — L89159 Pressure ulcer of sacral region, unspecified stage: Secondary | ICD-10-CM | POA: Diagnosis present

## 2023-09-22 DIAGNOSIS — Z79899 Other long term (current) drug therapy: Secondary | ICD-10-CM

## 2023-09-22 DIAGNOSIS — Z881 Allergy status to other antibiotic agents status: Secondary | ICD-10-CM | POA: Diagnosis not present

## 2023-09-22 DIAGNOSIS — J9601 Acute respiratory failure with hypoxia: Principal | ICD-10-CM | POA: Diagnosis present

## 2023-09-22 DIAGNOSIS — Z993 Dependence on wheelchair: Secondary | ICD-10-CM | POA: Diagnosis not present

## 2023-09-22 DIAGNOSIS — E8770 Fluid overload, unspecified: Secondary | ICD-10-CM | POA: Diagnosis present

## 2023-09-22 DIAGNOSIS — I160 Hypertensive urgency: Secondary | ICD-10-CM | POA: Diagnosis present

## 2023-09-22 DIAGNOSIS — K219 Gastro-esophageal reflux disease without esophagitis: Secondary | ICD-10-CM | POA: Diagnosis present

## 2023-09-22 DIAGNOSIS — I10 Essential (primary) hypertension: Secondary | ICD-10-CM | POA: Diagnosis present

## 2023-09-22 DIAGNOSIS — J189 Pneumonia, unspecified organism: Secondary | ICD-10-CM | POA: Diagnosis present

## 2023-09-22 DIAGNOSIS — Z1152 Encounter for screening for COVID-19: Secondary | ICD-10-CM

## 2023-09-22 DIAGNOSIS — R0602 Shortness of breath: Secondary | ICD-10-CM

## 2023-09-22 DIAGNOSIS — Z91158 Patient's noncompliance with renal dialysis for other reason: Secondary | ICD-10-CM | POA: Diagnosis not present

## 2023-09-22 DIAGNOSIS — L89150 Pressure ulcer of sacral region, unstageable: Secondary | ICD-10-CM | POA: Diagnosis present

## 2023-09-22 DIAGNOSIS — J440 Chronic obstructive pulmonary disease with acute lower respiratory infection: Secondary | ICD-10-CM | POA: Diagnosis present

## 2023-09-22 DIAGNOSIS — G40909 Epilepsy, unspecified, not intractable, without status epilepticus: Secondary | ICD-10-CM

## 2023-09-22 DIAGNOSIS — R54 Age-related physical debility: Secondary | ICD-10-CM | POA: Diagnosis present

## 2023-09-22 DIAGNOSIS — Z9359 Other cystostomy status: Secondary | ICD-10-CM

## 2023-09-22 DIAGNOSIS — M898X9 Other specified disorders of bone, unspecified site: Secondary | ICD-10-CM | POA: Diagnosis present

## 2023-09-22 DIAGNOSIS — Z992 Dependence on renal dialysis: Secondary | ICD-10-CM | POA: Diagnosis not present

## 2023-09-22 DIAGNOSIS — R0781 Pleurodynia: Secondary | ICD-10-CM

## 2023-09-22 DIAGNOSIS — L89309 Pressure ulcer of unspecified buttock, unspecified stage: Secondary | ICD-10-CM | POA: Diagnosis present

## 2023-09-22 DIAGNOSIS — D631 Anemia in chronic kidney disease: Secondary | ICD-10-CM | POA: Diagnosis present

## 2023-09-22 DIAGNOSIS — R9431 Abnormal electrocardiogram [ECG] [EKG]: Secondary | ICD-10-CM | POA: Diagnosis present

## 2023-09-22 LAB — I-STAT CHEM 8, ED
BUN: 66 mg/dL — ABNORMAL HIGH (ref 6–20)
Calcium, Ion: 1.16 mmol/L (ref 1.15–1.40)
Chloride: 100 mmol/L (ref 98–111)
Creatinine, Ser: 8.3 mg/dL — ABNORMAL HIGH (ref 0.44–1.00)
Glucose, Bld: 106 mg/dL — ABNORMAL HIGH (ref 70–99)
HCT: 42 % (ref 36.0–46.0)
Hemoglobin: 14.3 g/dL (ref 12.0–15.0)
Potassium: 4.7 mmol/L (ref 3.5–5.1)
Sodium: 137 mmol/L (ref 135–145)
TCO2: 25 mmol/L (ref 22–32)

## 2023-09-22 LAB — CBC WITH DIFFERENTIAL/PLATELET
Abs Immature Granulocytes: 0.02 10*3/uL (ref 0.00–0.07)
Basophils Absolute: 0.1 10*3/uL (ref 0.0–0.1)
Basophils Relative: 1 %
Eosinophils Absolute: 0.3 10*3/uL (ref 0.0–0.5)
Eosinophils Relative: 4 %
HCT: 41.5 % (ref 36.0–46.0)
Hemoglobin: 12.9 g/dL (ref 12.0–15.0)
Immature Granulocytes: 0 %
Lymphocytes Relative: 15 %
Lymphs Abs: 1 10*3/uL (ref 0.7–4.0)
MCH: 29.3 pg (ref 26.0–34.0)
MCHC: 31.1 g/dL (ref 30.0–36.0)
MCV: 94.3 fL (ref 80.0–100.0)
Monocytes Absolute: 0.4 10*3/uL (ref 0.1–1.0)
Monocytes Relative: 6 %
Neutro Abs: 5.1 10*3/uL (ref 1.7–7.7)
Neutrophils Relative %: 74 %
Platelets: 194 10*3/uL (ref 150–400)
RBC: 4.4 MIL/uL (ref 3.87–5.11)
RDW: 16.9 % — ABNORMAL HIGH (ref 11.5–15.5)
WBC: 6.8 10*3/uL (ref 4.0–10.5)
nRBC: 0 % (ref 0.0–0.2)

## 2023-09-22 LAB — COMPREHENSIVE METABOLIC PANEL WITH GFR
ALT: 20 U/L (ref 0–44)
AST: 22 U/L (ref 15–41)
Albumin: 3.5 g/dL (ref 3.5–5.0)
Alkaline Phosphatase: 105 U/L (ref 38–126)
Anion gap: 19 — ABNORMAL HIGH (ref 5–15)
BUN: 76 mg/dL — ABNORMAL HIGH (ref 6–20)
CO2: 23 mmol/L (ref 22–32)
Calcium: 9.7 mg/dL (ref 8.9–10.3)
Chloride: 94 mmol/L — ABNORMAL LOW (ref 98–111)
Creatinine, Ser: 7.51 mg/dL — ABNORMAL HIGH (ref 0.44–1.00)
GFR, Estimated: 6 mL/min — ABNORMAL LOW (ref >60)
Glucose, Bld: 108 mg/dL — ABNORMAL HIGH (ref 70–99)
Potassium: 4.6 mmol/L (ref 3.5–5.1)
Sodium: 136 mmol/L (ref 135–145)
Total Bilirubin: 0.9 mg/dL (ref 0.0–1.2)
Total Protein: 8.4 g/dL — ABNORMAL HIGH (ref 6.5–8.1)

## 2023-09-22 LAB — LACTIC ACID, PLASMA
Lactic Acid, Venous: 0.7 mmol/L (ref 0.5–1.9)
Lactic Acid, Venous: 0.6 mmol/L (ref 0.5–1.9)

## 2023-09-22 LAB — HEPATITIS B SURFACE ANTIGEN: Hepatitis B Surface Ag: NONREACTIVE

## 2023-09-22 LAB — TROPONIN I (HIGH SENSITIVITY)
Troponin I (High Sensitivity): 41 ng/L — ABNORMAL HIGH (ref <18)
Troponin I (High Sensitivity): 38 ng/L — ABNORMAL HIGH (ref <18)

## 2023-09-22 LAB — RESP PANEL BY RT-PCR (RSV, FLU A&B, COVID)  RVPGX2
Influenza A by PCR: NEGATIVE
Influenza B by PCR: NEGATIVE
Resp Syncytial Virus by PCR: NEGATIVE
SARS Coronavirus 2 by RT PCR: NEGATIVE

## 2023-09-22 LAB — BRAIN NATRIURETIC PEPTIDE: B Natriuretic Peptide: >4500 pg/mL — ABNORMAL HIGH (ref 0.0–100.0)

## 2023-09-22 LAB — PROCALCITONIN: Procalcitonin: 0.44 ng/mL

## 2023-09-22 LAB — MRSA NEXT GEN BY PCR, NASAL: MRSA by PCR Next Gen: NOT DETECTED

## 2023-09-22 MED ORDER — HEPARIN SODIUM (PORCINE) 5000 UNIT/ML IJ SOLN
5000.0000 [IU] | Freq: Three times a day (TID) | INTRAMUSCULAR | Status: DC
  Administered 2023-09-22 – 2023-09-25 (×6): 5000 [IU] via SUBCUTANEOUS
  Filled 2023-09-22 (×6): qty 1

## 2023-09-22 MED ORDER — RENA-VITE PO TABS
1.0000 | ORAL_TABLET | Freq: Every day | ORAL | Status: DC
  Administered 2023-09-22 – 2023-09-25 (×4): 1 via ORAL
  Filled 2023-09-22 (×4): qty 1

## 2023-09-22 MED ORDER — TRAMADOL HCL 50 MG PO TABS
50.0000 mg | ORAL_TABLET | Freq: Two times a day (BID) | ORAL | Status: DC | PRN
  Administered 2023-09-22 – 2023-09-23 (×2): 50 mg via ORAL
  Filled 2023-09-22 (×2): qty 1

## 2023-09-22 MED ORDER — ALBUTEROL SULFATE (2.5 MG/3ML) 0.083% IN NEBU: 2.5000 mg | INHALATION_SOLUTION | RESPIRATORY_TRACT | Status: DC | PRN

## 2023-09-22 MED ORDER — SODIUM CHLORIDE 0.9 % IV SOLN
2.0000 g | Freq: Once | INTRAVENOUS | Status: AC
  Administered 2023-09-22: 2 g via INTRAVENOUS
  Filled 2023-09-22: qty 10

## 2023-09-22 MED ORDER — CHLORHEXIDINE GLUCONATE CLOTH 2 % EX PADS
6.0000 | MEDICATED_PAD | Freq: Every day | CUTANEOUS | Status: DC
  Administered 2023-09-23 – 2023-09-25 (×2): 6 via TOPICAL

## 2023-09-22 MED ORDER — IRBESARTAN 150 MG PO TABS
150.0000 mg | ORAL_TABLET | Freq: Every day | ORAL | Status: DC
  Administered 2023-09-22 – 2023-09-25 (×4): 150 mg via ORAL
  Filled 2023-09-22 (×4): qty 1

## 2023-09-22 MED ORDER — CYCLOBENZAPRINE HCL 10 MG PO TABS
5.0000 mg | ORAL_TABLET | Freq: Three times a day (TID) | ORAL | Status: DC | PRN
  Administered 2023-09-22 – 2023-09-24 (×2): 5 mg via ORAL
  Filled 2023-09-22 (×2): qty 1

## 2023-09-22 MED ORDER — NEPRO/CARBSTEADY PO LIQD
237.0000 mL | Freq: Two times a day (BID) | ORAL | Status: DC
  Administered 2023-09-23 – 2023-09-25 (×4): 237 mL via ORAL

## 2023-09-22 MED ORDER — ACETAMINOPHEN 500 MG PO TABS
1000.0000 mg | ORAL_TABLET | Freq: Four times a day (QID) | ORAL | Status: DC | PRN
  Administered 2023-09-22 – 2023-09-25 (×2): 1000 mg via ORAL
  Filled 2023-09-22 (×2): qty 2

## 2023-09-22 MED ORDER — SODIUM CHLORIDE 0.9% FLUSH
3.0000 mL | Freq: Two times a day (BID) | INTRAVENOUS | Status: DC
  Administered 2023-09-22 – 2023-09-25 (×5): 3 mL via INTRAVENOUS

## 2023-09-22 MED ORDER — ACETAMINOPHEN 325 MG PO TABS
650.0000 mg | ORAL_TABLET | Freq: Once | ORAL | Status: AC
  Administered 2023-09-22: 650 mg via ORAL

## 2023-09-22 MED ORDER — CARVEDILOL 12.5 MG PO TABS
12.5000 mg | ORAL_TABLET | Freq: Two times a day (BID) | ORAL | Status: DC
  Administered 2023-09-22 – 2023-09-25 (×6): 12.5 mg via ORAL
  Filled 2023-09-22 (×6): qty 1

## 2023-09-22 MED ORDER — LEVETIRACETAM 500 MG PO TABS: 1000.0000 mg | ORAL_TABLET | Freq: Every evening | ORAL | Status: DC

## 2023-09-22 MED ORDER — TRAMADOL HCL 50 MG PO TABS: 50.0000 mg | ORAL_TABLET | Freq: Four times a day (QID) | ORAL | 0 refills | Status: DC | PRN

## 2023-09-22 MED ORDER — AMLODIPINE BESYLATE 5 MG PO TABS
5.0000 mg | ORAL_TABLET | Freq: Every day | ORAL | Status: DC
Start: 2023-09-22 — End: 2023-09-25
  Administered 2023-09-22 – 2023-09-25 (×4): 5 mg via ORAL
  Filled 2023-09-22 (×4): qty 1

## 2023-09-22 MED ORDER — VANCOMYCIN HCL IN DEXTROSE 1-5 GM/200ML-% IV SOLN
1000.0000 mg | Freq: Once | INTRAVENOUS | Status: AC
  Administered 2023-09-22: 1000 mg via INTRAVENOUS
  Filled 2023-09-22: qty 200

## 2023-09-22 MED ORDER — PANTOPRAZOLE SODIUM 40 MG PO TBEC
40.0000 mg | DELAYED_RELEASE_TABLET | Freq: Every day | ORAL | Status: DC
  Administered 2023-09-22 – 2023-09-25 (×4): 40 mg via ORAL
  Filled 2023-09-22 (×4): qty 1

## 2023-09-22 MED ORDER — HYDRALAZINE HCL 20 MG/ML IJ SOLN: 10.0000 mg | INTRAMUSCULAR | Status: DC | PRN

## 2023-09-22 MED ORDER — LEVETIRACETAM 500 MG PO TABS: 500.0000 mg | ORAL_TABLET | ORAL | Status: DC

## 2023-09-22 MED ORDER — SEVELAMER CARBONATE 800 MG PO TABS
800.0000 mg | ORAL_TABLET | Freq: Three times a day (TID) | ORAL | Status: DC
  Administered 2023-09-22 – 2023-09-25 (×8): 800 mg via ORAL
  Filled 2023-09-22 (×8): qty 1

## 2023-09-22 MED ORDER — LEVETIRACETAM 500 MG PO TABS
1000.0000 mg | ORAL_TABLET | Freq: Every evening | ORAL | Status: DC
  Administered 2023-09-22 – 2023-09-24 (×3): 1000 mg via ORAL
  Filled 2023-09-22 (×3): qty 2

## 2023-09-22 MED ORDER — ACETAMINOPHEN 325 MG PO TABS
ORAL_TABLET | ORAL | Status: AC
Start: 2023-09-22 — End: ?
  Filled 2023-09-22: qty 2

## 2023-09-22 MED ORDER — SENNOSIDES-DOCUSATE SODIUM 8.6-50 MG PO TABS
2.0000 | ORAL_TABLET | Freq: Every day | ORAL | Status: DC
  Administered 2023-09-22 – 2023-09-24 (×3): 2 via ORAL
  Filled 2023-09-22 (×3): qty 2

## 2023-09-22 MED ORDER — SODIUM CHLORIDE 0.9 % IV SOLN
1.0000 g | INTRAVENOUS | Status: DC
  Administered 2023-09-24 – 2023-09-25 (×2): 1 g via INTRAVENOUS
  Filled 2023-09-22 (×4): qty 10

## 2023-09-22 NOTE — Telephone Encounter (Signed)
 Rx sent

## 2023-09-22 NOTE — Telephone Encounter (Signed)
 Message relayed to her daughter
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
## 2023-09-22 NOTE — ED Notes (Signed)
 Pt to dialysis at this time

## 2023-09-22 NOTE — ED Triage Notes (Signed)
 Pt BIB [facility] from home for c/o increased sob over the last couple of days  Pt states her HHRN came to dress her sacral wound and found her to be sob  Ems arrived and pt was 90-91% on RA, they administered O2 at 2L and sats increased to 97-98%  Pt only complaint at this time is a headache

## 2023-09-22 NOTE — Consult Note (Signed)
 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; followed at [facility], last seen 07/24/2023 and ordered silver  hydrofiber every other day  Reason for Consult:sacral wound  Wound type: chronic non-healing Stage 4 Pressure Injuries B ischium  Pressure Injury POA: Yes Measurement: see nursing flowsheet  Wound bed: Drainage (amount, consistency, odor)  Periwound: Dressing procedure/placement/frequency:  Cleanse B ischial wounds with NS or Vashe wound cleanser, cut a piece of silver  hydrofiber (Aquacel Lorain Robson #[id number]) into a strip and using Q tip applicator insert into wound bed, cover with dry gauze and silicone foam or ABD pad whichever is preferred.   Patient should be placed on a low air loss mattress due to chronic pressure injuries.   POC discussed with bedside nurse.  WOC team will follow 09/23/2023 to ask for updated photo.    Thank you,    Ronni Colace MSN, RN-BC, [employer] [phone]

## 2023-09-22 NOTE — TOC Initial Note (Signed)
 Transition of Care [facility]) - Initial/Assessment Note    Patient Details  Name: [name] MRN: [id number] Date of Birth: 1966-04-09  Transition [facility] Care [facility]) CM/SW Contact:    Geraldina Klinefelter, RN Phone Number: 09/22/2023, 8:26 PM  Clinical Narrative:                 Pt is paraplegic since 2000 c/multiple comorbidities. Active c/[facility] for wound care for chronic pressure injuries, and uses [facility] for DME. Pt states her PCP ordered home oxygen for her a couple of months ago but the oxygen was never delivered. She does not have a preferred O2 provider. Pt has a hospital bed c/trapeze and Hoyer lift. She is active c/[facility] [address] for dialysis M-W-F.  TOC to follow.   Expected Discharge Plan: Home w Home Health Services Barriers to Discharge: Continued Medical Work up   Patient Goals and CMS Choice Patient states their goals for this hospitalization and ongoing recovery are:: To resolve breathing issues. CMS Medicare.gov Compare Post Acute Care list provided to:: Patient Choice offered to / list presented to : Patient [place] ownership interest in [facility].provided to:: Patient    Expected Discharge Plan and Services In-house Referral: Clinical Social Work Discharge Planning Services: CM Consult Post Acute Care Choice: Home Health Living arrangements for the past 2 months: Single Family Home                   DME Agency: Other - Comment ([facility])       HH Arranged: RN HH Agency: [facility]        Prior Living Arrangements/Services Living arrangements for the past 2 months: Single Family Home   Patient language and need for interpreter reviewed:: Yes Do you feel safe going back to the place where you live?: Yes      Need for Family Participation in Patient Care: Yes (Comment) Care giver support system in place?: Yes (comment) Current home services: Home RN Criminal Activity/Legal Involvement  Pertinent to Current Situation/Hospitalization: No - Comment as needed  Activities of Daily Living   ADL Screening (condition at time of admission) Independently performs ADLs?: No Does the patient have a NEW difficulty with bathing/dressing/toileting/self-feeding that is expected to last >3 days?: No Does the patient have a NEW difficulty with getting in/out of bed, walking, or climbing stairs that is expected to last >3 days?: No Does the patient have a NEW difficulty with communication that is expected to last >3 days?: No Is the patient deaf or have difficulty hearing?: No Does the patient have difficulty seeing, even when wearing glasses/contacts?: No Does the patient have difficulty concentrating, remembering, or making decisions?: No  Permission Sought/Granted Permission sought to share information with : Case Manager, Magazine features editor, Family Supports Permission granted to share information with : Yes, Verbal Permission Granted              Emotional Assessment   Attitude/Demeanor/Rapport: Engaged Affect (typically observed): Appropriate Orientation: : Oriented to Self, Oriented to Place, Oriented to  Time, Oriented to Situation Alcohol / Substance Use: Not Applicable Psych Involvement: No (comment)  Admission diagnosis:  SOB (shortness of breath) [R06.02] HCAP (healthcare-associated pneumonia) [J18.9] Acute hypoxic respiratory failure (HCC) [J96.01] Patient Active Problem List   Diagnosis Date Noted   Acute hypoxic respiratory failure (HCC) 09/22/2023   Pressure injury of skin 07/12/2023   RSV (respiratory syncytial virus infection) 06/28/2023   Volume overload  05/20/2023   Legionella pneumonia (HCC) 05/05/2023   Acute respiratory failure with hypoxia (HCC) 05/02/2023   Pulmonary edema 04/13/2023   Diarrhea 04/13/2023   Respiratory failure with hypoxia (HCC) 04/01/2023   Dyspnea and respiratory abnormalities 03/17/2023   Dyspnea 03/05/2023    Retroperitoneal hematoma 12/31/2022   Subcutaneous hematoma 12/31/2022   Hip osteomyelitis, right (HCC) 12/18/2022   Sepsis due to undetermined organism (HCC) 12/18/2022   Chronic pain 12/14/2022   Bacteremia 11/08/2022   Loss of weight 10/26/2022   N&V (nausea and vomiting) 10/26/2022   Cellulitis of knee, left 07/24/2022   Infected decubitus ulcer 07/23/2022   Osteomyelitis of multiple sites-chronic     Chronic anemia 02/25/2022   ESRD on hemodialysis (HCC) 02/25/2022   Abscess of buttock, right 08/25/2021   GERD (gastroesophageal reflux disease) 08/25/2021   Anxiety 08/25/2021   Decubitus ulcers 08/25/2021   Anemia 08/24/2021   AKI (acute kidney injury) (HCC) 04/25/2017   Dehydration 04/25/2017   Metabolic acidosis 04/25/2017   Altered mental status 06/22/2016   Altered mental state 08/16/2015   Pressure ulcer 05/16/2015   Acute encephalopathy 05/15/2015   Malnutrition (HCC)    Encephalopathy acute 10/10/2014   Sepsis (HCC) 10/10/2014   PNA (pneumonia) 10/10/2014   Encephalomalacia 10/10/2014   TBI (traumatic brain injury) (HCC) 10/10/2014   Severe protein-calorie malnutrition (HCC) 10/10/2014   UTI (urinary tract infection) 08/26/2014   Hypokalemia 08/26/2014   Abnormal uterine bleeding (AUB) 06/15/2014   Essential hypertension    Seizure disorder (HCC)    Status epilepticus (HCC) 05/02/2014   Hypertensive emergency 05/02/2014   Acute renal failure (HCC) 05/21/2013   Hyperkalemia 05/21/2013   Hypothermia 05/21/2013   Paraplegia (HCC) 06/25/2012   Suprapubic catheter (HCC) 06/25/2012   Decubitus ulcer of buttock 06/25/2012   PCP:  [name], [name], FNP Pharmacy:   [facility] - [place], [place] - [address] [place] [address] Phone: [phone] Fax: [phone]     Social Drivers of Health (SDOH) Social History: SDOH Screenings   Food Insecurity: No Food Insecurity (09/22/2023)  Housing: Low Risk  (09/22/2023)   Transportation Needs: No Transportation Needs (09/22/2023)  Utilities: Not At Risk (09/22/2023)  Depression (PHQ2-9): High Risk (07/31/2023)  Tobacco Use: Low Risk  (09/22/2023)   SDOH Interventions: Food Insecurity Interventions: Intervention Not Indicated   Readmission Risk Interventions    09/22/2023    8:17 PM 07/11/2023    8:38 AM 06/30/2023   10:47 AM  Readmission Risk Prevention Plan  Transportation Screening Complete Complete Complete  Medication Review (RN Care Manager) Complete Complete Complete  PCP or Specialist appointment within 3-5 days of discharge Complete    HRI or Home Care Consult Complete Complete Complete  SW Recovery Care/Counseling Consult Complete Complete Complete  Palliative Care Screening Not Applicable Not Applicable Not Applicable  Skilled Nursing Facility Not Applicable Not Applicable Not Applicable

## 2023-09-22 NOTE — ED Provider Notes (Signed)
 [place] EMERGENCY DEPARTMENT AT [facility] Provider Note   CSN: [id number] Arrival date & time: 09/22/23  4098     History  Chief Complaint  Patient presents with   Shortness of Breath    [name] is a 58 y.o. female.  HPI Patient with chronic debilitation chronic decubitus wound presents with shortness of breath.  She notes that after recent discharge for respiratory difficulty she was supposed to be on oxygen.  This was never established.  Today, during a home health visit patient complained of shortness of breath, was found to be hypoxic, 90% on room air.  Per EMS this improved to 98% with 2 L nasal cannula. Patient had wound care provided for her sacral wound today, declines additional evaluation of the currently, denies complaints from that, denies new weakness, numbness new fever.  EMS reports the patient was awake and alert throughout transport.    Home Medications Prior to Admission medications   Medication Sig Start Date End Date Taking? Authorizing Provider  acetaminophen  (TYLENOL ) 500 MG tablet Take 1,000 mg by mouth every 6 (six) hours as needed for moderate pain.    [provider]  albuterol  (PROVENTIL ) (2.5 MG/3ML) 0.083% nebulizer solution Take 3 mLs (2.5 mg total) by nebulization every 4 (four) hours as needed for wheezing or shortness of breath. 04/15/23 04/14/24  Colin Dawley, MD  amLODipine  (NORVASC ) 5 MG tablet Take 1 tablet (5 mg total) by mouth daily. Take 1 tablet on Monday,Wednesday and Friday then take 2 tablets on Tuesday,Thursday,Saturday and Sunday. 04/15/23   Colin Dawley, MD  carvedilol  (COREG ) 12.5 MG tablet Take 1 tablet (12.5 mg total) by mouth 2 (two) times daily with a meal. 08/07/23 11/25/23  [name], [name], FNP  cefUROXime  (CEFTIN ) 250 MG tablet Take 250 mg by mouth 2 (two) times daily with a meal. Take 1 tablet by mouth every MWF at 6pm for 2 doses    [provider]  cloNIDine  (CATAPRES  - DOSED IN  MG/24 HR) 0.1 mg/24hr patch Place 1 patch (0.1 mg total) onto the skin once a week. 09/18/23   Walker, Caitlin S, NP  cyclobenzaprine  (FLEXERIL ) 5 MG tablet Take 5 mg by mouth 3 (three) times daily as needed for muscle spasms. 04/11/23   [provider]  furosemide  (LASIX ) 80 MG tablet Take 80 mg by mouth 4 (four) times a week. 06/13/23   [provider]  irbesartan  (AVAPRO ) 150 MG tablet Take 1 tablet (150 mg total) by mouth daily. Take 150 mg by mouth daily. 08/07/23   [name], [name], FNP  levETIRAcetam  (KEPPRA ) 500 MG tablet Take 2 tablets (1,000 mg total) by mouth every evening. Take an additional tablet on Monday,Wednesday and Friday evening after Dialysis 04/15/23   Colin Dawley, MD  lidocaine -prilocaine  (EMLA ) cream Apply 1 Application topically 3 (three) times a week. 06/04/22   [provider]  pantoprazole  (PROTONIX ) 40 MG tablet Take 1 tablet (40 mg total) by mouth daily. 09/01/23   [name], [name], FNP  senna-docusate (SENOKOT-S) 8.6-50 MG tablet Take 2 tablets by mouth at bedtime. 11/12/22   Colin Dawley, MD  sevelamer  carbonate (RENVELA ) 800 MG tablet Take 800 mg by mouth 3 (three) times daily. 12/11/22   [provider]      Allergies    Benadryl [diphenhydramine hcl (sleep)], Daptomycin, Linezolid, Moxifloxacin, Quinine derivatives, Vancomycin , Azithromycin , Tetracycline, and Zosyn  [piperacillin  sod-tazobactam so]    Review of Systems   Review of Systems  Physical Exam Updated Vital Signs BP Aaron Aas)  194/99   Pulse 84   Temp 97.9 F (36.6 C) (Oral)   Resp (!) 21   Ht 5\' 2"  (1.575 m)   Wt 43.1 kg   LMP  (LMP Unknown)   SpO2 98%   BMI 17.38 kg/m  Physical Exam Vitals and nursing note reviewed.  Constitutional:      General: She is not in acute distress.    Appearance: She is well-developed. She is ill-appearing.  HENT:     Head: Normocephalic and atraumatic.  Eyes:     Conjunctiva/sclera: Conjunctivae normal.  Cardiovascular:      Rate and Rhythm: Normal rate and regular rhythm.  Pulmonary:     Effort: Pulmonary effort is normal. Tachypnea present.     Breath sounds: No stridor.  Abdominal:     General: There is no distension.  Skin:    General: Skin is warm and dry.     Comments: Decub with dressing in place  Neurological:     Mental Status: She is alert and oriented to person, place, and time.     Cranial Nerves: No cranial nerve deficit.     Comments: Lower extremity atrophy, baseline  Psychiatric:        Mood and Affect: Mood normal.     ED Results / Procedures / Treatments   Labs (all labs ordered are listed, but only abnormal results are displayed) Labs Reviewed  COMPREHENSIVE METABOLIC PANEL WITH GFR - Abnormal; Notable for the following components:      Result Value   Chloride 94 (*)    Glucose, Bld 108 (*)    BUN 76 (*)    Creatinine, Ser 7.51 (*)    Total Protein 8.4 (*)    GFR, Estimated 6 (*)    Anion gap 19 (*)    All other components within normal limits  BRAIN NATRIURETIC PEPTIDE - Abnormal; Notable for the following components:   B Natriuretic Peptide >4,500.0 (*)    All other components within normal limits  CBC WITH DIFFERENTIAL/PLATELET - Abnormal; Notable for the following components:   RDW 16.9 (*)    All other components within normal limits  I-STAT CHEM 8, ED - Abnormal; Notable for the following components:   BUN 66 (*)    Creatinine, Ser 8.30 (*)    Glucose, Bld 106 (*)    All other components within normal limits  TROPONIN I (HIGH SENSITIVITY) - Abnormal; Notable for the following components:   Troponin I (High Sensitivity) 41 (*)    All other components within normal limits  RESP PANEL BY RT-PCR (RSV, FLU A&B, COVID)  [id number]  LACTIC ACID, PLASMA  LACTIC ACID, PLASMA  TROPONIN I (HIGH SENSITIVITY)    EKG EKG Interpretation Date/Time:  Monday September 22 2023 10:01:17 EDT Ventricular Rate:  86 PR Interval:  140 QRS Duration:  119 QT Interval:  455 QTC  Calculation: 545 R Axis:   -73  Text Interpretation: Sinus rhythm Left atrial enlargement Incomplete RBBB and LAFB Consider anterior infarct Confirmed by Dorenda Gandy [phone]) on 09/22/2023 10:46:33 AM  Radiology No results found.  Procedures Procedures    Medications Ordered in ED Medications  aztreonam (AZACTAM) 2 g in sodium chloride  0.9 % 100 mL IVPB (2 g Intravenous New Bag/Given 09/22/23 1103)  vancomycin  (VANCOCIN ) IVPB 1000 mg/200 mL premix (has no administration in time range)    ED Course/ Medical Decision Making/ A&P  Medical Decision Making Adult female with paraplegia, decubitus ulcer, recent admission for shortness of breath, now presents with hypoxia, was found to be hypoxic, 90% on room air requiring supplemental oxygen.  Differential includes pneumonia, bacteremia, sepsis, though the patient is awake and alert, severe sepsis less likely. X-ray labs monitoring all started. Cardiac 85 sinus normal Pulse ox 98% 2 L nasal cannula abnormal   Amount and/or Complexity of Data Reviewed Independent Historian: EMS External Data Reviewed: notes. Labs: ordered. Decision-making details documented in ED Course. Radiology: ordered and independent interpretation performed. Decision-making details documented in ED Course. ECG/medicine tests: ordered and independent interpretation performed. Decision-making details documented in ED Course.  Risk Prescription drug management. Decision regarding hospitalization. Diagnosis or treatment significantly limited by social determinants of health.  Update:, Computer issues make x-ray interpretation not currently possible, but case discussed with radiology tech, patient with findings concern for right lower lobe pneumonia.  Given recent hospitalization broad-spectrum antibiotics starting.   11:32 AM BNP greater than 4500, and given the patient's hypoxia, suspicion for multifactorial etiology, with fluid  overload and pneumonia both likely contributors, though the patient is afebrile and has no lactic acidosis.  Patient has received broad-spectrum antibiotics, I discussed her case with Dr. Irene Mannheim to facilitate dialysis, and the patient will be admitted for further monitoring, management.  No current evidence for sepsis.  CRITICAL CARE Performed by: Dorenda Gandy Total critical care time: 35 minutes Critical care time was exclusive of separately billable procedures and treating other patients. Critical care was necessary to treat or prevent imminent or life-threatening deterioration. Critical care was time spent personally by me on the following activities: development of treatment plan with patient and/or surrogate as well as nursing, discussions with consultants, evaluation of patient's response to treatment, examination of patient, obtaining history from patient or surrogate, ordering and performing treatments and interventions, ordering and review of laboratory studies, ordering and review of radiographic studies, pulse oximetry and re-evaluation of patient's condition.  Final Clinical Impression(s) / ED Diagnoses Final diagnoses:  HCAP (healthcare-associated pneumonia)  SOB (shortness of breath)    Rx / DC Orders ED Discharge Orders     None         Dorenda Gandy, MD 09/22/23 1134

## 2023-09-22 NOTE — Progress Notes (Signed)
 Pt tolerated treatment well and goal met. Tylenol  650 mg po given during HD.  09/22/23 1632  Vitals  Temp 98 F (36.7 C)  Temp Source Oral  BP (!) 174/97  BP Location Right Wrist  BP Method Automatic  Patient Position (if appropriate) Lying  Pulse Rate 87  Resp 18  Oxygen Therapy  SpO2 100 %  O2 Device Nasal Cannula  O2 Flow Rate (L/min) 2 L/min  During Treatment Monitoring  Intra-Hemodialysis Comments Tx completed  Post Treatment  Dialyzer Clearance Lightly streaked  Hemodialysis Intake (mL) 0 mL  Liters Processed 82  Fluid Removed (mL) 2900 mL  Tolerated HD Treatment Yes  Post-Hemodialysis Comments Pt goal met.  AVG/AVF Arterial Site Held (minutes) 10 minutes  AVG/AVF Venous Site Held (minutes) 10 minutes

## 2023-09-23 DIAGNOSIS — J9601 Acute respiratory failure with hypoxia: Secondary | ICD-10-CM | POA: Diagnosis not present

## 2023-09-23 LAB — RENAL FUNCTION PANEL
Albumin: 3.1 g/dL — ABNORMAL LOW (ref 3.5–5.0)
Anion gap: 14 (ref 5–15)
BUN: 34 mg/dL — ABNORMAL HIGH (ref 6–20)
CO2: 27 mmol/L (ref 22–32)
Calcium: 8.8 mg/dL — ABNORMAL LOW (ref 8.9–10.3)
Chloride: 94 mmol/L — ABNORMAL LOW (ref 98–111)
Creatinine, Ser: 4.1 mg/dL — ABNORMAL HIGH (ref 0.44–1.00)
GFR, Estimated: 12 mL/min — ABNORMAL LOW (ref >60)
Glucose, Bld: 78 mg/dL (ref 70–99)
Phosphorus: 6 mg/dL — ABNORMAL HIGH (ref 2.5–4.6)
Potassium: 3.6 mmol/L (ref 3.5–5.1)
Sodium: 135 mmol/L (ref 135–145)

## 2023-09-23 LAB — PROCALCITONIN: Procalcitonin: 0.46 ng/mL

## 2023-09-23 MED ORDER — CHLORHEXIDINE GLUCONATE CLOTH 2 % EX PADS
6.0000 | MEDICATED_PAD | Freq: Every day | CUTANEOUS | Status: DC
  Administered 2023-09-23 – 2023-09-25 (×2): 6 via TOPICAL

## 2023-09-23 MED ORDER — CHLORHEXIDINE GLUCONATE CLOTH 2 % EX PADS
6.0000 | MEDICATED_PAD | Freq: Every day | CUTANEOUS | Status: DC
  Administered 2023-09-25: 6 via TOPICAL
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
## 2023-09-23 NOTE — Progress Notes (Signed)
 Pt completed dialysis treatment without issue.  09/23/23 1513  Vitals  Temp 98.2 F (36.8 C)  BP (!) 140/60  BP Location Right Wrist  Resp 20  Oxygen Therapy  SpO2 98 %  O2 Device Nasal Cannula  O2 Flow Rate (L/min) 2 L/min  During Treatment Monitoring  HD Safety Checks Performed Yes  Intra-Hemodialysis Comments Tx completed  Post Treatment  Dialyzer Clearance Lightly streaked  Hemodialysis Intake (mL) 0 mL  Liters Processed 72  Fluid Removed (mL) 1000 mL  Tolerated HD Treatment Yes  Post-Hemodialysis Comments Pt goal met.  AVG/AVF Arterial Site Held (minutes) 10 minutes  AVG/AVF Venous Site Held (minutes) 10 minutes
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
## 2023-09-24 ENCOUNTER — Inpatient Hospital Stay (HOSPITAL_COMMUNITY)

## 2023-09-24 DIAGNOSIS — J9601 Acute respiratory failure with hypoxia: Secondary | ICD-10-CM | POA: Diagnosis not present

## 2023-09-24 LAB — HEPATITIS B SURFACE ANTIBODY, QUANTITATIVE: Hep B S AB Quant (Post): 487 m[IU]/mL

## 2023-09-24 LAB — RENAL FUNCTION PANEL
Albumin: 3.1 g/dL — ABNORMAL LOW (ref 3.5–5.0)
Anion gap: 10 (ref 5–15)
BUN: 22 mg/dL — ABNORMAL HIGH (ref 6–20)
CO2: 28 mmol/L (ref 22–32)
Calcium: 9.1 mg/dL (ref 8.9–10.3)
Chloride: 94 mmol/L — ABNORMAL LOW (ref 98–111)
Creatinine, Ser: 3.31 mg/dL — ABNORMAL HIGH (ref 0.44–1.00)
GFR, Estimated: 16 mL/min — ABNORMAL LOW (ref >60)
Glucose, Bld: 89 mg/dL (ref 70–99)
Phosphorus: 5.3 mg/dL — ABNORMAL HIGH (ref 2.5–4.6)
Potassium: 3.9 mmol/L (ref 3.5–5.1)
Sodium: 132 mmol/L — ABNORMAL LOW (ref 135–145)

## 2023-09-24 NOTE — Progress Notes (Signed)
 Pt tolerated treatment well and goal met.  09/24/23 1400  Vitals  Temp 98 F (36.7 C)  Temp Source Oral  BP (!) 141/70  BP Location Right Arm  BP Method Automatic  Patient Position (if appropriate) Lying  Pulse Rate 75  ECG Heart Rate 75  Resp 20  Oxygen Therapy  SpO2 98 %  O2 Device Nasal Cannula  O2 Flow Rate (L/min) 2 L/min  During Treatment Monitoring  Intra-Hemodialysis Comments Tx completed  Post Treatment  Dialyzer Clearance Lightly streaked  Hemodialysis Intake (mL) 0 mL  Liters Processed 82  Fluid Removed (mL) 2000 mL  Tolerated HD Treatment Yes  Post-Hemodialysis Comments Pt goal met.  AVG/AVF Arterial Site Held (minutes) 10 minutes  AVG/AVF Venous Site Held (minutes) 10 minutes
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
## 2023-09-24 NOTE — Plan of Care (Signed)

## 2023-09-25 ENCOUNTER — Encounter (HOSPITAL_COMMUNITY)

## 2023-09-25 ENCOUNTER — Encounter (HOSPITAL_COMMUNITY): Payer: Self-pay

## 2023-09-25 DIAGNOSIS — J9601 Acute respiratory failure with hypoxia: Secondary | ICD-10-CM | POA: Diagnosis not present

## 2023-09-25 MED ORDER — AMLODIPINE BESYLATE 10 MG PO TABS
10.0000 mg | ORAL_TABLET | Freq: Every evening | ORAL | 3 refills | Status: DC
Start: 2023-09-25 — End: 2023-11-24

## 2023-09-25 MED ORDER — FUROSEMIDE 40 MG PO TABS
80.0000 mg | ORAL_TABLET | ORAL | Status: DC
  Administered 2023-09-25: 80 mg via ORAL
  Filled 2023-09-25: qty 2

## 2023-09-25 MED ORDER — FUROSEMIDE 80 MG PO TABS: 80.0000 mg | ORAL_TABLET | ORAL | 3 refills | Status: DC

## 2023-09-25 NOTE — TOC Transition Note (Signed)
 Transition of Care [facility]) - Discharge Note   Patient Details  Name: Ilene Ruggles MRN: [id number] Date of Birth: [birth year]  [facility]) CM/SW Contact:  Grandville Lax, LCSWA Phone Number: 09/25/2023, 9:56 AM   Clinical Narrative:    CSW updated that pt is medically stable for D/C home today. CSW updated Mariah Shines with [facility] of plan for D/C home today. CSW requested that MD place [facility] RN orders. TOC signing off.   Final next level of care: Home w Home Health Services Barriers to Discharge: Barriers Resolved   Patient Goals and CMS Choice Patient states their goals for this hospitalization and ongoing recovery are:: return home CMS Medicare.gov Compare Post Acute Care list provided to:: Patient Choice offered to / list presented to : Patient [place] ownership interest in [facility].provided to:: Patient    Discharge Placement                       Discharge Plan and Services Additional resources added to the After Visit Summary for   In-house Referral: Clinical Social Work Discharge Planning Services: CM Consult Post Acute Care Choice: Home Health            DME Agency: Other - Comment ([facility])       HH Arranged: RN [facility] Agency: [facility] Date [facility] Agency Contacted: 09/25/23   Representative spoke with at [facility] Agency: Mariah Shines  Social Drivers of Health (SDOH) Interventions SDOH Screenings   Food Insecurity: No Food Insecurity (09/22/2023)  Housing: Low Risk  (09/22/2023)  Transportation Needs: No Transportation Needs (09/22/2023)  Utilities: Not At Risk (09/22/2023)  Depression (PHQ2-9): High Risk (07/31/2023)  Tobacco Use: Low Risk  (09/22/2023)     Readmission Risk Interventions    09/22/2023    8:17 PM 07/11/2023    8:38 AM 06/30/2023   10:47 AM  Readmission Risk Prevention Plan  Transportation Screening Complete Complete Complete  Medication Review (RN Care Manager) Complete Complete Complete  PCP or  Specialist appointment within 3-5 days of discharge Complete    HRI or Home Care Consult Complete Complete Complete  SW Recovery Care/Counseling Consult Complete Complete Complete  Palliative Care Screening Not Applicable Not Applicable Not Applicable  Skilled Nursing Facility Not Applicable Not Applicable Not Applicable

## 2023-09-25 NOTE — Plan of Care (Signed)
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
## 2023-09-25 NOTE — Therapy (Signed)
 [facility] [address] [place], [place], [address] Phone: [phone]   Fax:  [phone]  Patient Details  Name: [name] MRN: [id number] Date of Birth: 18-Apr-1966 Referring Provider:  No ref. provider found  Pt was called concerning her missed appointment this morning, pt states she was just released from the hospital this afternoon. Pt is planning on being at next scheduled therapy appointment next week pending transportation.  Armond Bertin, PT, DPT [facility] Office: [phone] 3:54 PM, 09/25/23   [facility] [address] [place], [place], [address] Phone: [phone]   Fax:  [phone]

## 2023-09-25 NOTE — Progress Notes (Signed)
 Nsg Discharge Note  Admit Date:  09/22/2023 Discharge date: 09/25/2023   Ausha Yaindhi Casassa to be D/C'd Home per MD order.  AVS completed.   Patient/caregiver able to verbalize understanding.  Discharge Medication: Allergies as of 09/25/2023       Reactions   Benadryl [diphenhydramine Hcl (sleep)] Hives   Daptomycin Hives   Linezolid Other (See Comments)   Patient self-discontinued treatment due to GI intolerance. Taking it along with moxifloxacin   Moxifloxacin Other (See Comments)   Patient self-discontinued treatment due to GI intolerance. Taking it along with linezolid   Quinine Derivatives Other (See Comments)   Alters mental status   Vancomycin  Other (See Comments)   Pt is tolerating this medication at HD   Azithromycin  Itching, Rash   Tetracycline Itching   Able to tolerate Doxycycline .    Zosyn  [piperacillin  Sod-tazobactam So] Rash        Medication List     TAKE these medications    acetaminophen  500 MG tablet Commonly known as: TYLENOL  Take 1,000 mg by mouth every 6 (six) hours as needed for moderate pain.   albuterol  (2.5 MG/3ML) 0.083% nebulizer solution Commonly known as: PROVENTIL  Take 3 mLs (2.5 mg total) by nebulization every 4 (four) hours as needed for wheezing or shortness of breath.   amLODipine  10 MG tablet Commonly known as: NORVASC  Take 1 tablet (10 mg total) by mouth every evening. Take in the evenings -- What changed:  medication strength how much to take when to take this additional instructions   carvedilol  12.5 MG tablet Commonly known as: COREG  Take 1 tablet (12.5 mg total) by mouth 2 (two) times daily with a meal.   cloNIDine  0.1 mg/24hr patch Commonly known as: CATAPRES  - Dosed in mg/24 hr Place 1 patch (0.1 mg total) onto the skin once a week.   cyclobenzaprine  5 MG tablet Commonly known as: FLEXERIL  Take 5 mg by mouth 2 (two) times daily as needed for muscle spasms.   furosemide  80 MG tablet Commonly known as: LASIX  Take  1 tablet (80 mg total) by mouth every Tuesday, Thursday, Saturday, and Sunday. What changed: when to take this   irbesartan  150 MG tablet Commonly known as: AVAPRO  Take 1 tablet (150 mg total) by mouth daily. Take 150 mg by mouth daily.   levETIRAcetam  500 MG tablet Commonly known as: KEPPRA  Take 2 tablets (1,000 mg total) by mouth every evening. Take an additional tablet on Monday,Wednesday and Friday evening after Dialysis   lidocaine -prilocaine  cream Commonly known as: EMLA  Apply 1 Application topically 3 (three) times a week.   multivitamin Tabs tablet Take 1 tablet by mouth daily.   pantoprazole  40 MG tablet Commonly known as: PROTONIX  Take 1 tablet (40 mg total) by mouth daily. What changed:  when to take this reasons to take this   senna-docusate 8.6-50 MG tablet Commonly known as: Senokot-S Take 2 tablets by mouth at bedtime.   sevelamer  carbonate 800 MG tablet Commonly known as: RENVELA  Take 800 mg by mouth 3 (three) times daily.   traMADol  50 MG tablet Commonly known as: ULTRAM  Take 1 tablet (50 mg total) by mouth every 6 (six) hours as needed for up to 5 days for moderate pain (pain score 4-6).               Discharge Care Instructions  (From admission, onward)           Start     Ordered   09/25/23 0000  Discharge wound care:  Comments: Please see wound care above   09/25/23 1254            Discharge Assessment: Vitals:   09/24/23 1949 09/25/23 0500  BP: (!) 167/72 (!) 164/74  Pulse: 70 66  Resp: 16 16  Temp: 98.4 F (36.9 C) 97.9 F (36.6 C)  SpO2: 98% 100%   Skin clean, dry and intact without evidence of skin break down, no evidence of skin tears noted. IV catheter discontinued intact. Site without signs and symptoms of complications - no redness or edema noted at insertion site, patient denies c/o pain - only slight tenderness at site.  Dressing with slight pressure applied.  D/c Instructions-Education: Discharge  instructions given to patient/family with verbalized understanding. D/c education completed with patient/family including follow up instructions, medication list, d/c activities limitations if indicated, with other d/c instructions as indicated by MD - patient able to verbalize understanding, all questions fully answered. Patient instructed to return to ED, call 911, or call MD for any changes in condition.  Patient escorted via WC, and D/C home via private auto.  Lilton Relic, RN 09/25/2023 1:50 PM

## 2023-09-25 NOTE — Care Management Important Message (Signed)
 Important Message  Patient Details  Name: [name] MRN: [id number] Date of Birth: [birth year]   Important Message Given:  Yes - Medicare IM     Jerney Baksh L Anita Mcadory 09/25/2023, 9:50 AM

## 2023-09-25 NOTE — Discharge Instructions (Signed)
 1)Very Low-salt diet advised---Less than 2 gm of Sodium per day advised----ok to use Mrs DASH salt substitute instead of Salt 2)Limit your Fluid  intake to no more than 50 ounces (1.5 Liters) per day 3)You may benefit from extending your hemodialysis sessions to 3 and half hours each time from the usual 3 hours--- please discuss this further with your nephrologist
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
## 2023-09-26 ENCOUNTER — Emergency Department (HOSPITAL_COMMUNITY)

## 2023-09-26 ENCOUNTER — Other Ambulatory Visit: Payer: Self-pay

## 2023-09-26 ENCOUNTER — Encounter (HOSPITAL_COMMUNITY)

## 2023-09-26 ENCOUNTER — Inpatient Hospital Stay (HOSPITAL_COMMUNITY)
Admission: EM | Admit: 2023-09-26 | Discharge: 2023-09-30 | DRG: 091 | Disposition: A | Discharge status: Home / Self Care | Attending: Internal Medicine | Admitting: Internal Medicine

## 2023-09-26 ENCOUNTER — Encounter (HOSPITAL_COMMUNITY): Payer: Self-pay | Admitting: Student

## 2023-09-26 ENCOUNTER — Inpatient Hospital Stay (HOSPITAL_COMMUNITY)

## 2023-09-26 DIAGNOSIS — Z833 Family history of diabetes mellitus: Secondary | ICD-10-CM | POA: Diagnosis not present

## 2023-09-26 DIAGNOSIS — T361X5A Adverse effect of cephalosporins and other beta-lactam antibiotics, initial encounter: Secondary | ICD-10-CM | POA: Diagnosis present

## 2023-09-26 DIAGNOSIS — L98429 Non-pressure chronic ulcer of back with unspecified severity: Secondary | ICD-10-CM

## 2023-09-26 DIAGNOSIS — Z881 Allergy status to other antibiotic agents status: Secondary | ICD-10-CM | POA: Diagnosis not present

## 2023-09-26 DIAGNOSIS — K219 Gastro-esophageal reflux disease without esophagitis: Secondary | ICD-10-CM | POA: Diagnosis present

## 2023-09-26 DIAGNOSIS — I10 Essential (primary) hypertension: Secondary | ICD-10-CM | POA: Diagnosis not present

## 2023-09-26 DIAGNOSIS — E871 Hypo-osmolality and hyponatremia: Secondary | ICD-10-CM | POA: Diagnosis present

## 2023-09-26 DIAGNOSIS — Z8673 Personal history of transient ischemic attack (TIA), and cerebral infarction without residual deficits: Secondary | ICD-10-CM | POA: Diagnosis not present

## 2023-09-26 DIAGNOSIS — G9349 Other encephalopathy: Secondary | ICD-10-CM | POA: Diagnosis present

## 2023-09-26 DIAGNOSIS — G9340 Encephalopathy, unspecified: Principal | ICD-10-CM | POA: Diagnosis present

## 2023-09-26 DIAGNOSIS — L89324 Pressure ulcer of left buttock, stage 4: Secondary | ICD-10-CM | POA: Diagnosis present

## 2023-09-26 DIAGNOSIS — G8220 Paraplegia, unspecified: Secondary | ICD-10-CM | POA: Diagnosis present

## 2023-09-26 DIAGNOSIS — R5381 Other malaise: Secondary | ICD-10-CM | POA: Diagnosis present

## 2023-09-26 DIAGNOSIS — L89152 Pressure ulcer of sacral region, stage 2: Secondary | ICD-10-CM | POA: Diagnosis present

## 2023-09-26 DIAGNOSIS — Z7401 Bed confinement status: Secondary | ICD-10-CM

## 2023-09-26 DIAGNOSIS — D631 Anemia in chronic kidney disease: Secondary | ICD-10-CM | POA: Diagnosis present

## 2023-09-26 DIAGNOSIS — R200 Anesthesia of skin: Secondary | ICD-10-CM | POA: Diagnosis present

## 2023-09-26 DIAGNOSIS — Z993 Dependence on wheelchair: Secondary | ICD-10-CM

## 2023-09-26 DIAGNOSIS — J811 Chronic pulmonary edema: Secondary | ICD-10-CM | POA: Diagnosis present

## 2023-09-26 DIAGNOSIS — Z79899 Other long term (current) drug therapy: Secondary | ICD-10-CM

## 2023-09-26 DIAGNOSIS — L89159 Pressure ulcer of sacral region, unspecified stage: Secondary | ICD-10-CM | POA: Diagnosis not present

## 2023-09-26 DIAGNOSIS — I120 Hypertensive chronic kidney disease with stage 5 chronic kidney disease or end stage renal disease: Secondary | ICD-10-CM | POA: Diagnosis present

## 2023-09-26 DIAGNOSIS — L89314 Pressure ulcer of right buttock, stage 4: Secondary | ICD-10-CM | POA: Diagnosis present

## 2023-09-26 DIAGNOSIS — Z8701 Personal history of pneumonia (recurrent): Secondary | ICD-10-CM

## 2023-09-26 DIAGNOSIS — Z9359 Other cystostomy status: Secondary | ICD-10-CM

## 2023-09-26 DIAGNOSIS — N186 End stage renal disease: Secondary | ICD-10-CM | POA: Diagnosis present

## 2023-09-26 DIAGNOSIS — J9811 Atelectasis: Secondary | ICD-10-CM | POA: Diagnosis present

## 2023-09-26 DIAGNOSIS — Z880 Allergy status to penicillin: Secondary | ICD-10-CM

## 2023-09-26 DIAGNOSIS — Z8249 Family history of ischemic heart disease and other diseases of the circulatory system: Secondary | ICD-10-CM

## 2023-09-26 DIAGNOSIS — Z8669 Personal history of other diseases of the nervous system and sense organs: Secondary | ICD-10-CM | POA: Diagnosis not present

## 2023-09-26 DIAGNOSIS — N319 Neuromuscular dysfunction of bladder, unspecified: Secondary | ICD-10-CM | POA: Diagnosis present

## 2023-09-26 DIAGNOSIS — G9389 Other specified disorders of brain: Secondary | ICD-10-CM | POA: Diagnosis present

## 2023-09-26 DIAGNOSIS — R1011 Right upper quadrant pain: Secondary | ICD-10-CM | POA: Diagnosis not present

## 2023-09-26 DIAGNOSIS — G9341 Metabolic encephalopathy: Secondary | ICD-10-CM | POA: Diagnosis present

## 2023-09-26 DIAGNOSIS — Z888 Allergy status to other drugs, medicaments and biological substances status: Secondary | ICD-10-CM

## 2023-09-26 DIAGNOSIS — R561 Post traumatic seizures: Secondary | ICD-10-CM | POA: Diagnosis present

## 2023-09-26 DIAGNOSIS — R4189 Other symptoms and signs involving cognitive functions and awareness: Secondary | ICD-10-CM | POA: Diagnosis present

## 2023-09-26 DIAGNOSIS — R569 Unspecified convulsions: Secondary | ICD-10-CM | POA: Diagnosis not present

## 2023-09-26 DIAGNOSIS — Z992 Dependence on renal dialysis: Secondary | ICD-10-CM

## 2023-09-26 DIAGNOSIS — R4182 Altered mental status, unspecified: Secondary | ICD-10-CM | POA: Diagnosis present

## 2023-09-26 DIAGNOSIS — G928 Other toxic encephalopathy: Secondary | ICD-10-CM | POA: Diagnosis present

## 2023-09-26 DIAGNOSIS — Z87440 Personal history of urinary (tract) infections: Secondary | ICD-10-CM

## 2023-09-26 DIAGNOSIS — N2581 Secondary hyperparathyroidism of renal origin: Secondary | ICD-10-CM | POA: Diagnosis present

## 2023-09-26 HISTORY — DX: End stage renal disease: N18.6

## 2023-09-26 HISTORY — DX: End stage renal disease: Z99.2

## 2023-09-26 LAB — COMPREHENSIVE METABOLIC PANEL WITH GFR
ALT: 15 U/L (ref 0–44)
AST: 18 U/L (ref 15–41)
Albumin: 3.3 g/dL — ABNORMAL LOW (ref 3.5–5.0)
Alkaline Phosphatase: 81 U/L (ref 38–126)
Anion gap: 13 (ref 5–15)
BUN: 52 mg/dL — ABNORMAL HIGH (ref 6–20)
CO2: 24 mmol/L (ref 22–32)
Calcium: 9.5 mg/dL (ref 8.9–10.3)
Chloride: 95 mmol/L — ABNORMAL LOW (ref 98–111)
Creatinine, Ser: 5.39 mg/dL — ABNORMAL HIGH (ref 0.44–1.00)
GFR, Estimated: 9 mL/min — ABNORMAL LOW (ref >60)
Glucose, Bld: 100 mg/dL — ABNORMAL HIGH (ref 70–99)
Potassium: 4.1 mmol/L (ref 3.5–5.1)
Sodium: 132 mmol/L — ABNORMAL LOW (ref 135–145)
Total Bilirubin: 0.7 mg/dL (ref 0.0–1.2)
Total Protein: 7.8 g/dL (ref 6.5–8.1)

## 2023-09-26 LAB — BLOOD GAS, VENOUS
Acid-Base Excess: 6.4 mmol/L — ABNORMAL HIGH (ref 0.0–2.0)
Bicarbonate: 31.3 mmol/L — ABNORMAL HIGH (ref 20.0–28.0)
Drawn by: 442
O2 Saturation: 96.5 %
Patient temperature: 36.4
pCO2, Ven: 44 mmHg (ref 44–60)
pH, Ven: 7.46 — ABNORMAL HIGH (ref 7.25–7.43)
pO2, Ven: 79 mmHg — ABNORMAL HIGH (ref 32–45)

## 2023-09-26 LAB — CBC
HCT: 38.6 % (ref 36.0–46.0)
Hemoglobin: 12.3 g/dL (ref 12.0–15.0)
MCH: 29.8 pg (ref 26.0–34.0)
MCHC: 31.9 g/dL (ref 30.0–36.0)
MCV: 93.5 fL (ref 80.0–100.0)
Platelets: 162 10*3/uL (ref 150–400)
RBC: 4.13 MIL/uL (ref 3.87–5.11)
RDW: 16 % — ABNORMAL HIGH (ref 11.5–15.5)
WBC: 6.2 10*3/uL (ref 4.0–10.5)
nRBC: 0 % (ref 0.0–0.2)

## 2023-09-26 LAB — SEDIMENTATION RATE: Sed Rate: 38 mm/h — ABNORMAL HIGH (ref 0–22)

## 2023-09-26 LAB — MAGNESIUM: Magnesium: 2.3 mg/dL (ref 1.7–2.4)

## 2023-09-26 LAB — TSH: TSH: 2.393 u[IU]/mL (ref 0.350–4.500)

## 2023-09-26 LAB — FOLATE: Folate: >40 ng/mL (ref >5.9)

## 2023-09-26 LAB — CBG MONITORING, ED: Glucose-Capillary: 98 mg/dL (ref 70–99)

## 2023-09-26 LAB — VITAMIN B12: Vitamin B-12: 802 pg/mL (ref 180–914)

## 2023-09-26 LAB — AMMONIA: Ammonia: 18 umol/L (ref 9–35)

## 2023-09-26 MED ORDER — ORAL CARE MOUTH RINSE: 15.0000 mL | OROMUCOSAL | Status: DC | PRN

## 2023-09-26 MED ORDER — LABETALOL HCL 5 MG/ML IV SOLN: 10.0000 mg | INTRAVENOUS | Status: DC | PRN

## 2023-09-26 MED ORDER — ACETAMINOPHEN 650 MG RE SUPP: 650.0000 mg | Freq: Four times a day (QID) | RECTAL | Status: DC | PRN

## 2023-09-26 MED ORDER — HYDRALAZINE HCL 20 MG/ML IJ SOLN: 5.0000 mg | INTRAMUSCULAR | Status: DC | PRN

## 2023-09-26 MED ORDER — LEVETIRACETAM IN NACL 1000 MG/100ML IV SOLN
1000.0000 mg | Freq: Every day | INTRAVENOUS | Status: DC
  Administered 2023-09-26 – 2023-09-29 (×4): 1000 mg via INTRAVENOUS
  Filled 2023-09-26 (×4): qty 100

## 2023-09-26 MED ORDER — HEPARIN SODIUM (PORCINE) 5000 UNIT/ML IJ SOLN
5000.0000 [IU] | Freq: Three times a day (TID) | INTRAMUSCULAR | Status: DC
  Administered 2023-09-26 – 2023-09-30 (×11): 5000 [IU] via SUBCUTANEOUS
  Filled 2023-09-26 (×11): qty 1

## 2023-09-26 MED ORDER — LABETALOL HCL 5 MG/ML IV SOLN
10.0000 mg | INTRAVENOUS | Status: DC | PRN
  Administered 2023-09-27 (×2): 10 mg via INTRAVENOUS
  Filled 2023-09-26 (×2): qty 4

## 2023-09-26 MED ORDER — CLONIDINE HCL 0.2 MG/24HR TD PTWK
0.2000 mg | MEDICATED_PATCH | TRANSDERMAL | Status: DC
  Administered 2023-09-26: 0.2 mg via TRANSDERMAL
  Filled 2023-09-26: qty 1

## 2023-09-26 MED ORDER — LEVETIRACETAM IN NACL 500 MG/100ML IV SOLN
500.0000 mg | Freq: Once | INTRAVENOUS | Status: AC
  Administered 2023-09-27: 500 mg via INTRAVENOUS
  Filled 2023-09-26: qty 100

## 2023-09-26 MED ORDER — ACETAMINOPHEN 325 MG PO TABS
650.0000 mg | ORAL_TABLET | Freq: Four times a day (QID) | ORAL | Status: DC | PRN
  Administered 2023-09-28 (×2): 650 mg via ORAL
  Filled 2023-09-26 (×2): qty 2

## 2023-09-26 MED ORDER — LEVETIRACETAM IN NACL 1500 MG/100ML IV SOLN
1500.0000 mg | Freq: Once | INTRAVENOUS | Status: AC
  Administered 2023-09-26: 1500 mg via INTRAVENOUS
  Filled 2023-09-26: qty 100

## 2023-09-26 MED ORDER — LEVETIRACETAM IN NACL 500 MG/100ML IV SOLN
500.0000 mg | INTRAVENOUS | Status: DC
  Administered 2023-09-29: 500 mg via INTRAVENOUS
  Filled 2023-09-26 (×2): qty 100

## 2023-09-26 MED ORDER — LABETALOL HCL 5 MG/ML IV SOLN
20.0000 mg | Freq: Once | INTRAVENOUS | Status: DC
  Filled 2023-09-26: qty 4

## 2023-09-26 MED ORDER — ORAL CARE MOUTH RINSE
15.0000 mL | OROMUCOSAL | Status: DC
  Administered 2023-09-26 – 2023-09-30 (×9): 15 mL via OROMUCOSAL

## 2023-09-26 MED ORDER — CHLORHEXIDINE GLUCONATE CLOTH 2 % EX PADS
6.0000 | MEDICATED_PAD | Freq: Every day | CUTANEOUS | Status: DC
  Administered 2023-09-27 – 2023-09-29 (×3): 6 via TOPICAL

## 2023-09-26 NOTE — ED Notes (Signed)
 Pt returned from MRI, daughter at bedside and is talking all pt belongings home.
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
## 2023-09-26 NOTE — Progress Notes (Signed)
 Moved EEG equipment from ED 07 to 2W08. [facility] now monitoring.

## 2023-09-26 NOTE — ED Notes (Signed)
 EDP at bedside for assessment during triage.

## 2023-09-26 NOTE — ED Notes (Signed)
 Checked for urine but there was none to collect.

## 2023-09-26 NOTE — ED Provider Notes (Signed)
 Patient transferred from [facility] emergency department.  In short she has a history of posttraumatic paraplegia, epilepsy, press syndrome, neurogenic bladder end-stage renal disease on hemodialysis and was evaluated in the emergency department at [facility] for altered mentation.  Patient discharged from the hospital yesterday after being admitted for hypoxia and shortness of breath with coverage for multifocal pneumonia using a 3 mm then discharged home on tramadol  . Acute change in mental status.  She had negative MRI workup.  Neurology was consulted and patient was transferred for EEG monitoring and admit.  Dr. Lindzen aware. Physical Exam  BP (!) 193/97   Pulse 79   Temp 98.2 F (36.8 C) (Oral)   Resp (!) 21   Ht 5\' 2"  (1.575 m)   Wt 43.8 kg   LMP  (LMP Unknown)   SpO2 95%   BMI 17.66 kg/m   Physical Exam Patient is rolling around in her bed, she is speaking nonsensically. Procedures  Procedures  ED Course / MDM   Clinical Course as of 09/26/23 1456  Fri Sep 26, 2023  0932 Daughters at bedside.  Reports that at 6 PM patient started acting confused.  Also started complaining of some vision changes.  Confusion got worse.  Reports that her breathing has been fine.  Does have some sacral wounds but they are unchanged.  Does have bilateral stage III sacral wounds without erythema or drainage. [RP]  1230 Dr Cleone Dad recommends transfer to [facility] emergency department to ED for stat EEG.  Also I have contacted MRI to try and expedite the scan.  Dr. Liam Redhead from [facility] accepts the patient. [RP]    Clinical Course User Index [RP] Ninetta Basket, MD   Medical Decision Making Amount and/or Complexity of Data Reviewed Labs: ordered. Radiology: ordered.  Risk Prescription drug management. Decision regarding hospitalization.          Tama Fails, PA-C 09/28/23 1702    Scarlette Currier, MD 09/29/23 [phone]

## 2023-09-26 NOTE — ED Triage Notes (Signed)
 Patient transferred to [facility] ED from [facility]. Patient has been altered since starting her antibiotics, also patient has possible seizures. Patient is A&Ox0 on arrival. RN asked pt her name and she stated "escargots".

## 2023-09-26 NOTE — ED Notes (Signed)
Catheter drainage bag changed.

## 2023-09-26 NOTE — ED Triage Notes (Signed)
 Pt arrived [facility] from home c/o confusion since pt left yesterday. Foley in place, Pt not orient to self
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
## 2023-09-26 NOTE — ED Notes (Signed)
 Staff continue to wait for new urine in the changed bag for urine sample.

## 2023-09-26 NOTE — Consult Note (Signed)
 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  Cut a piece of silver  hydrofiber ([employer] Lawson [id number]) into a strip and using Q tip applicator insert into wound bed, cover with dry gauze and silicone foam or ABD pad whichever is preferred.    Patient should be placed on a low air loss mattress due to chronic pressure injuries.   POC discussed with bedside nurse and primary MD.  Did request updated photo of patients buttocks/sacrum be uploaded to chart.    Thank you,    Ronni Colace MSN, RN-BC, [employer] [phone]

## 2023-09-26 NOTE — Consult Note (Signed)
 Renal Service Consult Note [facility]  [name] 09/26/2023 Lynae Sandifer, MD Requesting Physician: Dr. Consuela Denier  Reason for Consult: ESRD patient with altered mental status HPI: The patient is a 58 y.o. year-old w/ PMH as below who presented to ED at [facility] on 09/22/2023 after being found at home hypoxic and complaining of shortness of breath by home health RN.  Also was having fevers and left-sided rib pain.  2 L applied by EMS.  In the ED patient was afebrile and hypertensive with blood pressure 204/105.  Chest x-ray showed a right lower lobe infiltrate and she was started on antibiotics for pneumonia.  Nephrology was consulted for dialysis.  The initial CXR on 428 showed probable pulmonary edema as well as infiltrate.  The patient was dialyzed on 4/28, 4/29 and 4/30 with a total of 6 L removed over the 3 sessions.  Follow-up x-ray on 430 looked much improved.  The patient however was having worsening mental status.  Patient was transferred for evaluation by neurology at [facility].   Pt seen in hospital room.  Patient is confused and not making sense with her responses.  No history obtained   ROS - n/a  PMH: History of cancer Hypertension Paraplegia of the lower extremities ESRD on HD History of seizures History of suprapubic catheter History of urinary tract infection History of abnormal uterine bleeding  Past Surgical History  Past Surgical History:  Procedure Laterality Date   APPLICATION OF WOUND VAC Right 09/13/2021   (approximately 1-55mos ago) pressure sore on right hip   BACK SURGERY     Pt stated "before 2000"   BIOPSY  12/03/2022   Procedure: BIOPSY;  Surgeon: Urban Garden, MD;  Location: [facility];  Service: Gastroenterology;;   ESOPHAGOGASTRODUODENOSCOPY N/A 09/20/2015   Procedure: ESOPHAGOGASTRODUODENOSCOPY (EGD);  Surgeon: Ruby Corporal, MD;  Location: [facility];  Service: Endoscopy;  Laterality: N/A;  730    ESOPHAGOGASTRODUODENOSCOPY (EGD) WITH PROPOFOL  N/A 12/03/2022   Procedure: ESOPHAGOGASTRODUODENOSCOPY (EGD) WITH PROPOFOL ;  Surgeon: Urban Garden, MD;  Location: [facility];  Service: Gastroenterology;  Laterality: N/A;  1:15 pm, asa 3, pt knows to arrive at 10:30  dialysis pt, M,W & F   IR CATHETER TUBE CHANGE  04/02/2018   PERCUTANEOUS ENDOSCOPIC GASTROSTOMY (PEG) REMOVAL N/A 09/20/2015   Procedure: PERCUTANEOUS ENDOSCOPIC GASTROSTOMY (PEG) REMOVAL;  Surgeon: Ruby Corporal, MD;  Location: [facility];  Service: Endoscopy;  Laterality: N/A;   TEE WITHOUT CARDIOVERSION N/A 11/11/2022   Procedure: TRANSESOPHAGEAL ECHOCARDIOGRAM (TEE);  Surgeon: Elmyra Haggard, MD;  Location: [facility];  Service: Cardiovascular;  Laterality: N/A;   Family History  Family History  Problem Relation Age of Onset   Cancer Mother    Hypertension Mother    Cancer Sister        breast and then spread everywhere.   Diabetes Paternal Grandmother    Hypertension Paternal Grandmother    Social History  reports that she has never smoked. She has never used smokeless tobacco. She reports current alcohol use. She reports that she does not use drugs. Allergies  Allergies  Allergen Reactions   Benadryl [Diphenhydramine Hcl (Sleep)] Hives   Daptomycin Hives   Linezolid Other (See Comments)    Patient self-discontinued treatment due to GI intolerance. Taking it along with moxifloxacin   Moxifloxacin Other (See Comments)    Patient self-discontinued treatment due to GI intolerance. Taking it along with linezolid   Quinine Derivatives Other (See Comments)  Alters mental status   Vancomycin  Other (See Comments)    Pt is tolerating this medication at HD   Azithromycin  Itching and Rash   Tetracycline Itching    Able to tolerate Doxycycline .    Zosyn  [Piperacillin  Sod-Tazobactam So] Rash   Home medications Prior to Admission medications   Medication Sig Start Date End Date Taking? Authorizing Provider   acetaminophen  (TYLENOL ) 500 MG tablet Take 1,000 mg by mouth every 6 (six) hours as needed for moderate pain.   Yes [provider]  albuterol  (PROVENTIL ) (2.5 MG/3ML) 0.083% nebulizer solution Take 3 mLs (2.5 mg total) by nebulization every 4 (four) hours as needed for wheezing or shortness of breath. 04/15/23 04/14/24 Yes Emokpae, Courage, MD  amLODipine  (NORVASC ) 10 MG tablet Take 1 tablet (10 mg total) by mouth every evening. Take in the evenings -- Patient taking differently: Take 5 mg by mouth every evening. Take in the evenings -- 09/25/23  Yes Colin Dawley, MD  carvedilol  (COREG ) 12.5 MG tablet Take 1 tablet (12.5 mg total) by mouth 2 (two) times daily with a meal. 08/07/23 11/25/23 Yes [name], [name], FNP  cloNIDine  (CATAPRES  - DOSED IN MG/24 HR) 0.1 mg/24hr patch Place 1 patch (0.1 mg total) onto the skin once a week. 09/18/23  Yes Clearnce Curia, NP  cyclobenzaprine  (FLEXERIL ) 5 MG tablet Take 5 mg by mouth 2 (two) times daily as needed for muscle spasms. 04/11/23  Yes [provider]  furosemide  (LASIX ) 80 MG tablet Take 1 tablet (80 mg total) by mouth every Tuesday, Thursday, Saturday, and Sunday. 09/25/23  Yes Emokpae, Courage, MD  irbesartan  (AVAPRO ) 150 MG tablet Take 1 tablet (150 mg total) by mouth daily. Take 150 mg by mouth daily. 08/07/23  Yes [name], [name], FNP  levETIRAcetam  (KEPPRA ) 500 MG tablet Take 2 tablets (1,000 mg total) by mouth every evening. Take an additional tablet on Monday,Wednesday and Friday evening after Dialysis 04/15/23  Yes Emokpae, Courage, MD  lidocaine -prilocaine  (EMLA ) cream Apply 1 Application topically 3 (three) times a week. 06/04/22  Yes [provider]  multivitamin (RENA-VIT) TABS tablet Take 1 tablet by mouth daily. 07/29/23  Yes [provider]  pantoprazole  (PROTONIX ) 40 MG tablet Take 1 tablet (40 mg total) by mouth daily. Patient taking differently: Take 40 mg by mouth daily as needed (acid reflux/heartburn).  09/01/23  Yes [name], [name], FNP  senna-docusate (SENOKOT-S) 8.6-50 MG tablet Take 2 tablets by mouth at bedtime. 11/12/22  Yes Colin Dawley, MD  sevelamer  carbonate (RENVELA ) 800 MG tablet Take 800 mg by mouth 3 (three) times daily. 12/11/22  Yes [provider]  traMADol  (ULTRAM ) 50 MG tablet Take 1 tablet (50 mg total) by mouth every 6 (six) hours as needed for up to 5 days for moderate pain (pain score 4-6). 09/22/23 09/27/23 Yes [name], [name], FNP     Vitals:   09/26/23 1445 09/26/23 1515 09/26/23 1545 09/26/23 1707  BP: (!) 180/90 (!) 182/96 (!) 187/92 (!) 196/90  Pulse: 73 73 74 72  Resp: (!) 21 (!) 24 20 20   Temp:    98.7 F (37.1 C)  TempSrc:      SpO2: 99% 99% 97% 95%  Weight:      Height:       Exam Gen somnolent, responding w/ nonsensical words No rash, cyanosis or gangrene Sclera anicteric, throat clear  No jvd or bruits Chest clear bilat to bases, no rales/ wheezing RRR no RG Abd soft ntnd no mass or ascites +bs GU defer  MS LE's contractured Ext no LE or UE edema, no other edema Neuro as above, moves UE's, not LE's    LUA AVF+bruit       Renal-related home meds: Amlodipine  5 mg at bedtime Coreg  12.5 mg twice daily Clonidine  patch 0.1 weekly Lasix  80 mg on nondialysis days Irbesartan  150 mg daily Rena-Vite 1 daily Renvela  1 AC 3 times daily Others: Albuterol , Flexeril , Keppra , Emla , PPI, Ultram     OP HD: [facility] MWF   3h  B400   43kg   3K bath  Heparin  none  LUA AVF Noncompliant- frequently signs off or misses Calcitriol  1 mcg each tx Mircera 175 mcg every 2 weeks - due on 12/30    Assessment/ Plan: AMS: Cefepime  is known to cause altered mental status in ESRD patients.  This should be discontinued if not already stopped. Otherwise per pmd/ neuro.  ESRD: on HD MWF. Had 3 HD sessions this week, last was Wed. Will plan HD short tomorrow, then resume MWF on Monday.  HTN: bp's up, doesn't look vol overloaded at this point. Not  sure if she can swallow pills. Will d/w pmd.  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 Debility/ paraplegia   Larry Poag  MD [facility] 09/26/2023, 5:23 PM  Recent Labs  Lab 09/22/23 1018 09/23/23 0410 09/24/23 0350 09/26/23 0921  HGB 14.3  --   --  12.3  ALBUMIN  --  3.1* 3.1* 3.3*  CALCIUM   --  8.8* 9.1 9.5  PHOS  --  6.0* 5.3*  --   CREATININE 8.30* 4.10* 3.31* 5.39*  K 4.7 3.6 3.9 4.1   Inpatient medications:  heparin   5,000 Units Subcutaneous Q8H   mouth rinse  15 mL Mouth Rinse Q2H    acetaminophen  **OR** acetaminophen , mouth rinse

## 2023-09-26 NOTE — ED Notes (Signed)
[facility] here for pt. 
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
## 2023-09-26 NOTE — ED Notes (Signed)
 Daughter at bedside. Daughter said when she picked up pt yesterday from discharge. Pt had c/o vision issues in hospital and began stuttering around 530pm . Daughter stated pt usually doesn't have left side twitching and as far as she knows pt has not took tramadol  yet.

## 2023-09-26 NOTE — ED Notes (Signed)
 Pt gone to MRI

## 2023-09-26 NOTE — Progress Notes (Addendum)
 Pt EEG headbox disconnected due to moving the patient to different bed.  Study to continue once settled into bed.

## 2023-09-26 NOTE — Progress Notes (Cosign Needed Addendum)
 LTM EEG hooked up with CT compatible leads. [facility] not monitoring until patient gets on the floor.

## 2023-09-26 NOTE — Plan of Care (Signed)
 Na

## 2023-09-26 NOTE — ED Notes (Signed)
 Daughter aware of transfer ED to ED.

## 2023-09-27 ENCOUNTER — Inpatient Hospital Stay (HOSPITAL_COMMUNITY)

## 2023-09-27 ENCOUNTER — Encounter (HOSPITAL_COMMUNITY)

## 2023-09-27 DIAGNOSIS — Z8669 Personal history of other diseases of the nervous system and sense organs: Secondary | ICD-10-CM | POA: Diagnosis not present

## 2023-09-27 DIAGNOSIS — I10 Essential (primary) hypertension: Secondary | ICD-10-CM | POA: Diagnosis not present

## 2023-09-27 DIAGNOSIS — L89159 Pressure ulcer of sacral region, unspecified stage: Secondary | ICD-10-CM

## 2023-09-27 DIAGNOSIS — G928 Other toxic encephalopathy: Secondary | ICD-10-CM

## 2023-09-27 DIAGNOSIS — N186 End stage renal disease: Secondary | ICD-10-CM

## 2023-09-27 DIAGNOSIS — R569 Unspecified convulsions: Secondary | ICD-10-CM

## 2023-09-27 DIAGNOSIS — G9340 Encephalopathy, unspecified: Secondary | ICD-10-CM | POA: Diagnosis not present

## 2023-09-27 DIAGNOSIS — Z992 Dependence on renal dialysis: Secondary | ICD-10-CM

## 2023-09-27 LAB — CBC
HCT: 39 % (ref 36.0–46.0)
Hemoglobin: 12.1 g/dL (ref 12.0–15.0)
MCH: 28.6 pg (ref 26.0–34.0)
MCHC: 31 g/dL (ref 30.0–36.0)
MCV: 92.2 fL (ref 80.0–100.0)
Platelets: 165 10*3/uL (ref 150–400)
RBC: 4.23 MIL/uL (ref 3.87–5.11)
RDW: 16.1 % — ABNORMAL HIGH (ref 11.5–15.5)
WBC: 5.8 10*3/uL (ref 4.0–10.5)
nRBC: 0 % (ref 0.0–0.2)

## 2023-09-27 LAB — RENAL FUNCTION PANEL
Albumin: 3.1 g/dL — ABNORMAL LOW (ref 3.5–5.0)
Anion gap: 14 (ref 5–15)
BUN: 62 mg/dL — ABNORMAL HIGH (ref 6–20)
CO2: 23 mmol/L (ref 22–32)
Calcium: 9.4 mg/dL (ref 8.9–10.3)
Chloride: 97 mmol/L — ABNORMAL LOW (ref 98–111)
Creatinine, Ser: 6.73 mg/dL — ABNORMAL HIGH (ref 0.44–1.00)
GFR, Estimated: 7 mL/min — ABNORMAL LOW (ref >60)
Glucose, Bld: 76 mg/dL (ref 70–99)
Phosphorus: 7.1 mg/dL — ABNORMAL HIGH (ref 2.5–4.6)
Potassium: 4.6 mmol/L (ref 3.5–5.1)
Sodium: 134 mmol/L — ABNORMAL LOW (ref 135–145)

## 2023-09-27 LAB — RPR: RPR Ser Ql: NONREACTIVE

## 2023-09-27 MED ORDER — ANTICOAGULANT SODIUM CITRATE 4% (200MG/5ML) IV SOLN: 5.0000 mL | Status: DC | PRN

## 2023-09-27 MED ORDER — HYDRALAZINE HCL 20 MG/ML IJ SOLN
INTRAMUSCULAR | Status: AC
  Filled 2023-09-27: qty 1

## 2023-09-27 MED ORDER — LABETALOL HCL 5 MG/ML IV SOLN: 10.0000 mg | INTRAVENOUS | Status: DC | PRN

## 2023-09-27 MED ORDER — LIDOCAINE-PRILOCAINE 2.5-2.5 % EX CREA: 1.0000 | TOPICAL_CREAM | CUTANEOUS | Status: DC | PRN

## 2023-09-27 MED ORDER — NEPRO/CARBSTEADY PO LIQD: 237.0000 mL | ORAL | Status: DC | PRN

## 2023-09-27 MED ORDER — HYDRALAZINE HCL 20 MG/ML IJ SOLN
20.0000 mg | INTRAMUSCULAR | Status: DC | PRN
  Administered 2023-09-27 – 2023-09-28 (×2): 20 mg via INTRAVENOUS
  Filled 2023-09-27: qty 1

## 2023-09-27 MED ORDER — HEPARIN SODIUM (PORCINE) 1000 UNIT/ML DIALYSIS: 1000.0000 [IU] | INTRAMUSCULAR | Status: DC | PRN

## 2023-09-27 MED ORDER — PENTAFLUOROPROP-TETRAFLUOROETH EX AERO: 1.0000 | INHALATION_SPRAY | CUTANEOUS | Status: DC | PRN

## 2023-09-27 MED ORDER — HYDRALAZINE HCL 20 MG/ML IJ SOLN
10.0000 mg | INTRAMUSCULAR | Status: DC | PRN
  Administered 2023-09-27: 10 mg via INTRAVENOUS
  Filled 2023-09-27: qty 1

## 2023-09-27 MED ORDER — LIDOCAINE HCL (PF) 1 % IJ SOLN: 5.0000 mL | INTRAMUSCULAR | Status: DC | PRN

## 2023-09-27 MED ORDER — ALTEPLASE 2 MG IJ SOLR: 2.0000 mg | Freq: Once | INTRAMUSCULAR | Status: DC | PRN
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
## 2023-09-27 NOTE — Progress Notes (Signed)
 [facility] Progress Note  Subjective:    Vitals:   09/27/23 1007 09/27/23 1030 09/27/23 1100 09/27/23 1130  BP: (!) 204/91 (!) 181/91 (!) 183/88 (!) 192/92  Pulse: 80 81 82 83  Resp: 17 18 20 20   Temp:      TempSrc:      SpO2: 94% 100% 94% 94%  Weight:      Height:        Exam: Gen continue w/ nonsensical words and sounds No jvd or bruits Chest clear bilat to bases, no rales/ wheezing RRR no RG Abd soft ntnd no mass or ascites +bs MS LE's contractured Ext  no LE or UE edema Neuro as above, moves UE's, not LE's    LUA AVF+bruit     Renal-related home meds: Amlodipine  5 mg at bedtime Coreg  12.5 mg twice daily Clonidine  patch 0.1 weekly Lasix  80 mg on nondialysis days Irbesartan  150 mg daily Rena-Vite 1 daily Renvela  1 AC 3 times daily Others: Albuterol , Flexeril , Keppra , Emla , PPI, Ultram       OP HD: [facility] MWF   3h  B400   43kg   3K bath  Heparin  none  LUA AVF Noncompliant- frequently signs off or misses Calcitriol  1 mcg each tx Mircera 175 mcg every 2 weeks - due on 12/30    CXR 5/02 - mild R effusion, no edema/ congestion   Assessment/ Plan: AMS: Cefepime  is known to cause altered mental status in ESRD patients. She had 2 doses on 4/30 and 5/01, now stopped. Can take days to clear out.  ESRD: on HD MWF. Had 3 HD sessions this week, last was Wednesday. Plan is for short HD today then resume MWF on Monday.  HTN: bp is up a bit, can't swallow pills, have her on catapres  patch 0.2mg  + prn IV meds for now.  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 Debility/ paraplegia      Larry Poag MD  [facility] 09/27/2023, 11:51 AM  Recent Labs  Lab 09/24/23 0350 09/26/23 0921 09/27/23 0703  HGB  --  12.3 12.1  ALBUMIN 3.1* 3.3* 3.1*  CALCIUM  9.1 9.5 9.4  PHOS 5.3*  --  7.1*  CREATININE  3.31* 5.39* 6.73*  K 3.9 4.1 4.6   No results for input(s): "IRON ", "TIBC", "FERRITIN" in the last 168 hours. Inpatient medications:  Chlorhexidine  Gluconate Cloth  6 each Topical Q0600   cloNIDine   0.2 mg Transdermal Weekly   heparin   5,000 Units Subcutaneous Q8H   mouth rinse  15 mL Mouth Rinse Q2H    anticoagulant sodium citrate      levETIRAcetam  1,000 mg (09/26/23 2255)   levETIRAcetam      levETIRAcetam      acetaminophen  **OR** acetaminophen , alteplase , anticoagulant sodium citrate , feeding supplement (NEPRO CARB STEADY), heparin , hydrALAZINE , labetalol , lidocaine  (PF), lidocaine -prilocaine , mouth rinse, pentafluoroprop-tetrafluoroeth

## 2023-09-27 NOTE — Progress Notes (Signed)
 SLP Cancellation Note  Patient Details Name: [name] MRN: [id number] DOB: 1965-10-31   Cancelled treatment:       Reason Eval/Treat Not Completed: Patient at procedure or test/unavailable;Other (comment) (patient and HD. SLP will follow)   Jacqualine Mater, MA, CCC-SLP Speech Therapy

## 2023-09-27 NOTE — Procedures (Signed)
 Received patient in bed to unit.  Alert. Informed consent signed and in chart.   TX duration: 3 hours  Patient tolerated well.  Transported back to the room  Alert, without acute distress.  Hand-off given to patient's nurse.   Access used: left fistula Access issues: none  Total UF removed: 2 liters Medication(s) given: none   Clover Dao, RN [facility]
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
## 2023-09-27 NOTE — Progress Notes (Signed)
 SLP Cancellation Note  Patient Details Name: [name] MRN: [id number] DOB: [birth year]   Cancelled treatment:       Reason Eval/Treat Not Completed: Patient declined, no reason specified. Patient is awake, alert but confused and not interacting with people or environment. She is repeating words in Spanish but per son, she typically speaks in [place] and this confusion is not her baseline. Patient refusing all SLP's attempts to offer PO's. Recommend continue NPO while she is confused and SLP will follow for PO readiness.   Jacqualine Mater, MA, CCC-SLP Speech Therapy

## 2023-09-27 NOTE — Progress Notes (Signed)
LTM EEG disconnected - no skin breakdown at unhook. [facility] notified.  

## 2023-09-27 NOTE — Plan of Care (Signed)
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
## 2023-09-28 ENCOUNTER — Inpatient Hospital Stay (HOSPITAL_COMMUNITY)

## 2023-09-28 DIAGNOSIS — G9340 Encephalopathy, unspecified: Secondary | ICD-10-CM | POA: Diagnosis not present

## 2023-09-28 DIAGNOSIS — I10 Essential (primary) hypertension: Secondary | ICD-10-CM | POA: Diagnosis not present

## 2023-09-28 DIAGNOSIS — R569 Unspecified convulsions: Secondary | ICD-10-CM | POA: Diagnosis not present

## 2023-09-28 MED ORDER — NEPRO/CARBSTEADY PO LIQD: 237.0000 mL | ORAL | Status: DC | PRN

## 2023-09-28 MED ORDER — LIDOCAINE-PRILOCAINE 2.5-2.5 % EX CREA: 1.0000 | TOPICAL_CREAM | CUTANEOUS | Status: DC | PRN

## 2023-09-28 MED ORDER — LIDOCAINE 5 % EX PTCH
1.0000 | MEDICATED_PATCH | CUTANEOUS | Status: DC
  Administered 2023-09-28 – 2023-09-29 (×2): 1 via TRANSDERMAL
  Filled 2023-09-28 (×2): qty 1

## 2023-09-28 MED ORDER — ALTEPLASE 2 MG IJ SOLR: 2.0000 mg | Freq: Once | INTRAMUSCULAR | Status: DC | PRN

## 2023-09-28 MED ORDER — CARVEDILOL 12.5 MG PO TABS
12.5000 mg | ORAL_TABLET | Freq: Two times a day (BID) | ORAL | Status: DC
  Administered 2023-09-28 – 2023-09-30 (×3): 12.5 mg via ORAL
  Filled 2023-09-28 (×3): qty 1

## 2023-09-28 MED ORDER — CLONIDINE HCL 0.3 MG/24HR TD PTWK
0.3000 mg | MEDICATED_PATCH | TRANSDERMAL | Status: DC
  Administered 2023-09-28: 0.3 mg via TRANSDERMAL
  Filled 2023-09-28: qty 1

## 2023-09-28 MED ORDER — GABAPENTIN 100 MG PO CAPS
100.0000 mg | ORAL_CAPSULE | Freq: Two times a day (BID) | ORAL | Status: DC
  Administered 2023-09-28 – 2023-09-29 (×2): 100 mg via ORAL
  Filled 2023-09-28 (×3): qty 1

## 2023-09-28 MED ORDER — IRBESARTAN 150 MG PO TABS
150.0000 mg | ORAL_TABLET | Freq: Every day | ORAL | Status: DC
  Administered 2023-09-28 – 2023-09-30 (×2): 150 mg via ORAL
  Filled 2023-09-28 (×3): qty 1

## 2023-09-28 MED ORDER — HEPARIN SODIUM (PORCINE) 1000 UNIT/ML DIALYSIS: 1000.0000 [IU] | INTRAMUSCULAR | Status: DC | PRN

## 2023-09-28 MED ORDER — LIDOCAINE HCL (PF) 1 % IJ SOLN: 5.0000 mL | INTRAMUSCULAR | Status: DC | PRN

## 2023-09-28 MED ORDER — ANTICOAGULANT SODIUM CITRATE 4% (200MG/5ML) IV SOLN
5.0000 mL | Status: DC | PRN
  Filled 2023-09-28: qty 5

## 2023-09-28 MED ORDER — PENTAFLUOROPROP-TETRAFLUOROETH EX AERO: 1.0000 | INHALATION_SPRAY | CUTANEOUS | Status: DC | PRN

## 2023-09-28 MED ORDER — AMLODIPINE BESYLATE 10 MG PO TABS
10.0000 mg | ORAL_TABLET | Freq: Every day | ORAL | Status: DC
  Administered 2023-09-28 – 2023-09-30 (×2): 10 mg via ORAL
  Filled 2023-09-28 (×2): qty 1

## 2023-09-28 MED ORDER — CHLORHEXIDINE GLUCONATE CLOTH 2 % EX PADS
6.0000 | MEDICATED_PAD | Freq: Every day | CUTANEOUS | Status: DC
  Administered 2023-09-28 – 2023-09-30 (×3): 6 via TOPICAL
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
## 2023-09-28 NOTE — Plan of Care (Signed)
   Problem: Education: Goal: Knowledge of General Education information will improve Description: Including pain rating scale, medication(s)/side effects and non-pharmacologic comfort measures Outcome: Progressing   Problem: Clinical Measurements: Goal: Ability to maintain clinical measurements within normal limits will improve Outcome: Progressing Goal: Will remain free from infection Outcome: Progressing
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
## 2023-09-28 NOTE — Progress Notes (Signed)
 [facility] Progress Note  Subjective:  Much better today, interacting normally  Vitals:   09/28/23 0810 09/28/23 0921 09/28/23 1022 09/28/23 1023  BP: (!) 190/101 (!) 190/101 (!) 142/76 (!) 142/76  Pulse: 86     Resp: 19     Temp: 98.2 F (36.8 C)     TempSrc: Oral     SpO2: 96%     Weight:      Height:        Exam: Gen alert and interacting appropriately today No jvd or bruits Chest clear bilat to bases RRR no RG Abd soft ntnd no mass or ascites +bs MS LE's contractured Ext  no LE or UE edema Neuro MS is much better, chronic paraplegic     LUA AVF+bruit     Renal-related home meds: Amlodipine  5 mg at bedtime Coreg  12.5 mg twice daily Clonidine  patch 0.1 weekly Lasix  80 mg on nondialysis days Irbesartan  150 mg daily Rena-Vite 1 daily Renvela  1 AC 3 times daily Others: Albuterol , Flexeril , Keppra , Emla , PPI, Ultram       OP HD: [facility] MWF   3h  B400   43kg   3K bath  Heparin  none  LUA AVF Noncompliant- frequently signs off or misses Calcitriol  1 mcg each tx Mircera 175 mcg every 2 weeks - due on 12/30    CXR 5/02 - mild R effusion, no edema/ congestion   Assessment/ Plan: AMS: cefepime  is known to cause altered mental status in ESRD patients. She had 2 doses on 4/30 and 5/01, then stopped. Looks much better today.  ESRD: on HD MWF. HD tomorrow.  HTN: w/ improved MS is taking pills now. BP's better.  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 Cont binders w/ meals.  Debility/ LE paraplegia      Larry Poag MD  [facility] 09/28/2023, 10:56 AM  Recent Labs  Lab 09/24/23 0350 09/26/23 0921 09/27/23 0703  HGB  --  12.3 12.1  ALBUMIN 3.1* 3.3* 3.1*  CALCIUM  9.1 9.5 9.4  PHOS 5.3*  --  7.1*  CREATININE 3.31* 5.39* 6.73*  K 3.9 4.1 4.6   No results for input(s): "IRON ", "TIBC", "FERRITIN" in the last 168 hours. Inpatient  medications:  amLODipine   10 mg Oral Daily   carvedilol   12.5 mg Oral BID WC   Chlorhexidine  Gluconate Cloth  6 each Topical Q0600   cloNIDine   0.3 mg Transdermal Q Sun   heparin   5,000 Units Subcutaneous Q8H   irbesartan   150 mg Oral Daily   mouth rinse  15 mL Mouth Rinse Q2H    levETIRAcetam  1,000 mg (09/27/23 2233)   levETIRAcetam      acetaminophen  **OR** acetaminophen , hydrALAZINE , labetalol , mouth rinse

## 2023-09-28 NOTE — Plan of Care (Signed)

## 2023-09-29 ENCOUNTER — Inpatient Hospital Stay (HOSPITAL_COMMUNITY)

## 2023-09-29 DIAGNOSIS — G9340 Encephalopathy, unspecified: Secondary | ICD-10-CM | POA: Diagnosis not present

## 2023-09-29 DIAGNOSIS — N186 End stage renal disease: Secondary | ICD-10-CM | POA: Diagnosis not present

## 2023-09-29 DIAGNOSIS — Z992 Dependence on renal dialysis: Secondary | ICD-10-CM | POA: Diagnosis not present

## 2023-09-29 DIAGNOSIS — I10 Essential (primary) hypertension: Secondary | ICD-10-CM | POA: Diagnosis not present

## 2023-09-29 LAB — CBC
HCT: 37.7 % (ref 36.0–46.0)
Hemoglobin: 11.7 g/dL — ABNORMAL LOW (ref 12.0–15.0)
MCH: 28.7 pg (ref 26.0–34.0)
MCHC: 31 g/dL (ref 30.0–36.0)
MCV: 92.6 fL (ref 80.0–100.0)
Platelets: 193 10*3/uL (ref 150–400)
RBC: 4.07 MIL/uL (ref 3.87–5.11)
RDW: 16.2 % — ABNORMAL HIGH (ref 11.5–15.5)
WBC: 5.7 10*3/uL (ref 4.0–10.5)
nRBC: 0 % (ref 0.0–0.2)

## 2023-09-29 LAB — RENAL FUNCTION PANEL
Albumin: 3.1 g/dL — ABNORMAL LOW (ref 3.5–5.0)
Anion gap: 19 — ABNORMAL HIGH (ref 5–15)
BUN: 57 mg/dL — ABNORMAL HIGH (ref 6–20)
CO2: 23 mmol/L (ref 22–32)
Calcium: 9.5 mg/dL (ref 8.9–10.3)
Chloride: 92 mmol/L — ABNORMAL LOW (ref 98–111)
Creatinine, Ser: 6.4 mg/dL — ABNORMAL HIGH (ref 0.44–1.00)
GFR, Estimated: 7 mL/min — ABNORMAL LOW (ref >60)
Glucose, Bld: 99 mg/dL (ref 70–99)
Phosphorus: 7.9 mg/dL — ABNORMAL HIGH (ref 2.5–4.6)
Potassium: 4.3 mmol/L (ref 3.5–5.1)
Sodium: 134 mmol/L — ABNORMAL LOW (ref 135–145)

## 2023-09-29 MED ORDER — ENSURE ENLIVE PO LIQD
237.0000 mL | Freq: Two times a day (BID) | ORAL | Status: DC
  Administered 2023-09-30: 237 mL via ORAL

## 2023-09-29 NOTE — Progress Notes (Signed)
 Chaplain responded to a request to provide information about the AD form. Pt received information and requested some time to fill out the form. Chaplain asked Pt to notify this office when form is ready for notarization.  Althea Jesus Chaplain Resident   09/29/23 1707  Spiritual Encounters  Type of Visit Initial  Care provided to: Patient  Reason for visit Advance directives  OnCall Visit No

## 2023-09-29 NOTE — Progress Notes (Signed)
 Tried to call ultrasound to ask if they have got pts RUQ ultrasound. PT is very hungry.

## 2023-09-29 NOTE — Progress Notes (Signed)
 [facility] Progress Note  Subjective:  Seen in HD - mental status normal, tolerating treatment well per RN  Vitals:   09/28/23 2313 09/29/23 0612 09/29/23 0740 09/29/23 0802  BP: 129/70 133/66 130/60 121/66  Pulse: 74 65 66 69  Resp:   18 19  Temp: 98.4 F (36.9 C) 98.2 F (36.8 C) 97.8 F (36.6 C)   TempSrc: Oral Oral    SpO2: 97% 95% 95% 97%  Weight:      Height:        Exam: Gen alert and interacting appropriately today No jvd or bruits Chest clear bilat to bases RRR no RG Abd soft ntnd no mass or ascites +bs MS LE's contractured Ext  no LE or UE edema Neuro MS is normal, chronic paraplegic     LUA AVF+bruit     Renal-related home meds: Amlodipine  5 mg at bedtime Coreg  12.5 mg twice daily Clonidine  patch 0.1 weekly Lasix  80 mg on nondialysis days Irbesartan  150 mg daily Rena-Vite 1 daily Renvela  1 AC 3 times daily Others: Albuterol , Flexeril , Keppra , Emla , PPI, Ultram       OP HD: [facility] MWF   3h  B400   43kg   3K bath  Heparin  none  LUA AVF Noncompliant- frequently signs off or misses Calcitriol  1 mcg each tx Mircera 175 mcg every 2 weeks - due on 12/30    CXR 5/02 - mild R effusion, no edema/ congestion   Assessment/ Plan: AMS: cefepime  is known to cause altered mental status in ESRD patients. She had 2 doses on 4/30 and 5/01, then stopped. Appears back to baseline ESRD: on HD MWF. HD today  HTN: w/ improved MS is taking pills now. BP's controlled.  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 Cont binders w/ meals.  Debility/ LE paraplegia   Adrian Alba MD [facility] Pager [phone]   Recent Labs  Lab 09/24/23 [phone] 09/26/23 0921 09/27/23 0703  HGB  --  12.3 12.1  ALBUMIN 3.1* 3.3* 3.1*  CALCIUM  9.1 9.5 9.4  PHOS 5.3*  --  7.1*  CREATININE 3.31* 5.39* 6.73*  K 3.9 4.1 4.6   No results  for input(s): "IRON ", "TIBC", "FERRITIN" in the last 168 hours. Inpatient medications:  amLODipine   10 mg Oral Daily   carvedilol   12.5 mg Oral BID WC   Chlorhexidine  Gluconate Cloth  6 each Topical Q0600   Chlorhexidine  Gluconate Cloth  6 each Topical Q0600   cloNIDine   0.3 mg Transdermal Q Sun   gabapentin   100 mg Oral BID   heparin   5,000 Units Subcutaneous Q8H   irbesartan   150 mg Oral Daily   lidocaine   1 patch Transdermal Q24H   mouth rinse  15 mL Mouth Rinse Q2H    anticoagulant sodium citrate      levETIRAcetam  1,000 mg (09/28/23 2221)   levETIRAcetam      acetaminophen  **OR** acetaminophen , alteplase , anticoagulant sodium citrate , feeding supplement (NEPRO CARB STEADY), heparin , hydrALAZINE , labetalol , lidocaine  (PF), lidocaine -prilocaine , mouth rinse, pentafluoroprop-tetrafluoroeth
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
## 2023-09-29 NOTE — Progress Notes (Signed)
 Pt going to dialysis

## 2023-09-29 NOTE — Plan of Care (Signed)

## 2023-09-29 NOTE — Progress Notes (Signed)
 Pt receives out-pt HD at [facility] on MWF with 11:00 am chair time. Will assist as needed.   Lauraine Polite Renal Navigator [phone]

## 2023-09-29 NOTE — Progress Notes (Signed)
 SLP Cancellation Note  Patient Details Name: [name] MRN: [id number] DOB: 04-20-1966   Cancelled treatment:        Pt off floor for HD this morning.  Will follow up for formal cognitive-linguistic assessment as schedule permits.   Elester Grim, MA, CCC-SLP Acute Rehabilitation Services Office: [phone] 09/29/2023, 8:28 AM

## 2023-09-30 ENCOUNTER — Encounter (HOSPITAL_COMMUNITY): Payer: Self-pay

## 2023-09-30 ENCOUNTER — Encounter (HOSPITAL_COMMUNITY)

## 2023-09-30 DIAGNOSIS — R1011 Right upper quadrant pain: Secondary | ICD-10-CM | POA: Diagnosis not present

## 2023-09-30 DIAGNOSIS — G9340 Encephalopathy, unspecified: Secondary | ICD-10-CM | POA: Diagnosis not present

## 2023-09-30 DIAGNOSIS — I10 Essential (primary) hypertension: Secondary | ICD-10-CM | POA: Diagnosis not present

## 2023-09-30 DIAGNOSIS — N186 End stage renal disease: Secondary | ICD-10-CM | POA: Diagnosis not present

## 2023-09-30 MED ORDER — GABAPENTIN 100 MG PO CAPS: 100.0000 mg | ORAL_CAPSULE | Freq: Two times a day (BID) | ORAL | 0 refills | Status: DC

## 2023-09-30 MED ORDER — ENSURE ENLIVE PO LIQD: 237.0000 mL | Freq: Two times a day (BID) | ORAL | 2 refills | Status: DC

## 2023-09-30 MED ORDER — DEXMEDETOMIDINE HCL IN NACL 80 MCG/20ML IV SOLN
INTRAVENOUS | Status: AC
  Filled 2023-09-30: qty 40

## 2023-09-30 MED ORDER — LIDOCAINE-EPINEPHRINE (PF) 2 %-1:200000 IJ SOLN
INTRAMUSCULAR | Status: AC
  Filled 2023-09-30: qty 20

## 2023-09-30 NOTE — Progress Notes (Signed)
 [facility] Progress Note  Subjective:  Seen in room - for d/c Daughter bedside No new concerns  Vitals:   09/29/23 2334 09/29/23 2335 09/30/23 0432 09/30/23 0753  BP: (!) 125/58  128/73 (!) 148/70  Pulse: 70 66 68 66  Resp: 16  18 18   Temp:  98.1 F (36.7 C) 97.7 F (36.5 C) (!) 97.5 F (36.4 C)  TempSrc:  Oral Oral Oral  SpO2: 98% 97% 100% 97%  Weight:      Height:        Exam: Gen alert and interacting appropriately today  No jvd or bruits Chest clear bilat to bases RRR no RG Abd soft ntnd no mass or ascites +bs MS LE's contractured Ext  no LE or UE edema Neuro MS is normal, chronic paraplegic     LUA AVF+bruit       OP HD: [facility] MWF   3h  B400   43kg   3K bath  Heparin  none  LUA AVF Noncompliant- frequently signs off or misses Calcitriol  1 mcg each tx Mircera 175 mcg every 2 weeks - due on 12/30    CXR 5/02 - mild R effusion, no edema/ congestion   Assessment/ Plan: AMS: cefepime  is known to cause altered mental status in ESRD patients. She had 2 doses on 4/30 and 5/01, then stopped. Appears back to baseline. Adding cefepime  to allergy list.  ESRD: on HD MWF. HD tomorrow - outpt.   HTN: w/ improved MS is taking pills now. BP's controlled.  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 Cont binders w/ meals.  Debility/ LE paraplegia Ok for d/c.    Adrian Alba MD [facility] Pager [phone]   Recent Labs  Lab 09/27/23 0703 09/29/23 0804  HGB 12.1 11.7*  ALBUMIN 3.1* 3.1*  CALCIUM  9.4 9.5  PHOS 7.1* 7.9*  CREATININE 6.73* 6.40*  K 4.6 4.3   No results for input(s): "IRON ", "TIBC", "FERRITIN" in the last 168 hours. Inpatient medications:  amLODipine   10 mg Oral Daily   carvedilol   12.5 mg Oral BID WC   Chlorhexidine  Gluconate Cloth  6 each Topical Q0600   cloNIDine   0.3 mg Transdermal Q Sun    feeding supplement  237 mL Oral BID BM   gabapentin   100 mg Oral BID   heparin   5,000 Units Subcutaneous Q8H   irbesartan   150 mg Oral Daily   lidocaine   1 patch Transdermal Q24H   mouth rinse  15 mL Mouth Rinse Q2H    levETIRAcetam  1,000 mg (09/29/23 2234)   levETIRAcetam  500 mg (09/29/23 2252)   acetaminophen  **OR** acetaminophen , hydrALAZINE , labetalol , mouth rinse

## 2023-09-30 NOTE — Care Management Important Message (Signed)
 Important Message  Patient Details  Name: [name] MRN: [id number] Date of Birth: [birth year]   Important Message Given:  Yes - Medicare IM     Wynonia Hedges 09/30/2023, 3:37 PM

## 2023-09-30 NOTE — Therapy (Signed)
 [facility] Out[facility] at [facility] [address] [place], [place], [address] Phone: [phone]   Fax:  [phone]  Patient Details  Name: [name] MRN: [id number] Date of Birth: [birth year] Referring Provider:  No ref. provider found  Encounter Date: 09/30/2023  Pt was called concerning her 11:45 appointment this morning, pt did not answer and her voicemail box has not been set up yet. We hope to see her at her next appointment time.  Armond Bertin, PT, DPT [facility] Office: [phone] 12:20 PM, 09/30/23   [facility] Out[facility] Rehabilitation at [facility] [address] [place], [place], [address] Phone: [phone]   Fax:  [phone]

## 2023-09-30 NOTE — Plan of Care (Signed)
 Patient is A&O x 4. VSS, on room air. Sinus rhythm on the tele monitor. Pt asymptomatic. Neuro's intact. Safety maintained. Call bell in reach. Will continue to monitor.   Problem: Education: Goal: Knowledge of General Education information will improve Description: Including pain rating scale, medication(s)/side effects and non-pharmacologic comfort measures Outcome: Progressing   Problem: Health Behavior/Discharge Planning: Goal: Ability to manage health-related needs will improve Outcome: Progressing   Problem: Clinical Measurements: Goal: Ability to maintain clinical measurements within normal limits will improve Outcome: Progressing Goal: Will remain free from infection Outcome: Progressing Goal: Diagnostic test results will improve Outcome: Progressing Goal: Respiratory complications will improve Outcome: Progressing Goal: Cardiovascular complication will be avoided Outcome: Progressing   Problem: Activity: Goal: Risk for activity intolerance will decrease Outcome: Progressing   Problem: Nutrition: Goal: Adequate nutrition will be maintained Outcome: Progressing   Problem: Coping: Goal: Level of anxiety will decrease Outcome: Progressing   Problem: Elimination: Goal: Will not experience complications related to bowel motility Outcome: Progressing Goal: Will not experience complications related to urinary retention Outcome: Progressing   Problem: Pain Managment: Goal: General experience of comfort will improve and/or be controlled Outcome: Progressing   Problem: Safety: Goal: Ability to remain free from injury will improve Outcome: Progressing   Problem: Skin Integrity: Goal: Risk for impaired skin integrity will decrease Outcome: Progressing   Problem: Education: Goal: Expressions of having a comfortable level of knowledge regarding the disease process will increase Outcome: Progressing   Problem: Coping: Goal: Ability to adjust to condition or change  in health will improve Outcome: Progressing Goal: Ability to identify appropriate support needs will improve Outcome: Progressing   Problem: Health Behavior/Discharge Planning: Goal: Compliance with prescribed medication regimen will improve Outcome: Progressing   Problem: Medication: Goal: Risk for medication side effects will decrease Outcome: Progressing   Problem: Clinical Measurements: Goal: Complications related to the disease process, condition or treatment will be avoided or minimized Outcome: Progressing Goal: Diagnostic test results will improve Outcome: Progressing   Problem: Safety: Goal: Verbalization of understanding the information provided will improve Outcome: Progressing   Problem: Self-Concept: Goal: Level of anxiety will decrease Outcome: Progressing Goal: Ability to verbalize feelings about condition will improve Outcome: Progressing

## 2023-09-30 NOTE — Progress Notes (Addendum)
 D/C order noted. Contacted [facility] to be advised of pt's d/c today and that pt should resume care tomorrow. Will fax d/c summary and most recent renal note once available for continuation of care.   Lauraine Polite Renal Navigator [phone]  Addendum at 12:11 pm: Today's renal note faxed to clinic for continuation of care. Will fax d/c summary once completed and available.

## 2023-10-01 LAB — LEVETIRACETAM LEVEL: Levetiracetam Lvl: 20 ug/mL (ref 10.0–40.0)

## 2023-10-02 LAB — VITAMIN B1: Vitamin B1 (Thiamine): 172 nmol/L (ref 66.5–200.0)
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
## 2023-10-07 ENCOUNTER — Telehealth (INDEPENDENT_AMBULATORY_CARE_PROVIDER_SITE_OTHER): Admitting: Internal Medicine

## 2023-10-07 ENCOUNTER — Encounter: Payer: Self-pay | Admitting: Internal Medicine

## 2023-10-07 ENCOUNTER — Encounter (HOSPITAL_COMMUNITY)

## 2023-10-07 DIAGNOSIS — Z09 Encounter for follow-up examination after completed treatment for conditions other than malignant neoplasm: Secondary | ICD-10-CM | POA: Diagnosis not present

## 2023-10-07 DIAGNOSIS — G40909 Epilepsy, unspecified, not intractable, without status epilepticus: Secondary | ICD-10-CM

## 2023-10-07 DIAGNOSIS — N186 End stage renal disease: Secondary | ICD-10-CM | POA: Diagnosis not present

## 2023-10-07 DIAGNOSIS — J189 Pneumonia, unspecified organism: Secondary | ICD-10-CM

## 2023-10-07 DIAGNOSIS — Z992 Dependence on renal dialysis: Secondary | ICD-10-CM

## 2023-10-07 DIAGNOSIS — G9340 Encephalopathy, unspecified: Secondary | ICD-10-CM | POA: Diagnosis not present
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
## 2023-10-07 NOTE — Assessment & Plan Note (Signed)
 Hospital chart reviewed, including discharge summary Medications reconciled and reviewed with the patient in detail

## 2023-10-07 NOTE — Assessment & Plan Note (Signed)
 Continue Keppra , follow-up with Neurology

## 2023-10-07 NOTE — Assessment & Plan Note (Addendum)
 Could be due to cefepime  versus seizures Had Neurology evaluation during recent hospitalization Continue Keppra  1000 mg QD and additional 500 mg MWF Mental status improved now, at baseline

## 2023-10-07 NOTE — Patient Instructions (Signed)
 Please continue to take medications as prescribed.  Please continue to follow renal diet.

## 2023-10-07 NOTE — Assessment & Plan Note (Addendum)
 On HD MWF Followed by Nephrology

## 2023-10-07 NOTE — Assessment & Plan Note (Signed)
 Had recently Cefepime , but had severe AMS from it Last CXR showed improved aeration Currently denies cough, dyspnea, chest pain

## 2023-10-09 ENCOUNTER — Other Ambulatory Visit: Payer: Self-pay | Admitting: Family Medicine

## 2023-10-09 DIAGNOSIS — K219 Gastro-esophageal reflux disease without esophagitis: Secondary | ICD-10-CM

## 2023-10-13 ENCOUNTER — Inpatient Hospital Stay: Admitting: Family Medicine

## 2023-10-14 ENCOUNTER — Encounter (HOSPITAL_COMMUNITY)

## 2023-10-14 ENCOUNTER — Encounter (HOSPITAL_COMMUNITY): Payer: Self-pay

## 2023-10-14 NOTE — Therapy (Signed)
 [facility] Outpatient Rehabilitation at [facility] [address] [place], [place], [address] Phone: [phone]   Fax:  [phone]  Patient Details  Name: [name] MRN: [id number] Date of Birth: 1965-11-19 Referring Provider:  No ref. provider found  Encounter Date: 10/14/2023  PHYSICAL THERAPY DISCHARGE SUMMARY  Visits from Start of Care: 0  Current functional level related to goals / functional outcomes: Pt has not returned for reassessment.   Remaining deficits: Pt has not returned for reassessment   Education / Equipment: Pt was educated on the role of PT and the process of getting a new referral.   Patient agrees to discharge. Patient goals were not met. Patient is being discharged due to not returning since the last visit.   Pt was called concerning her third missed appointment. Pt was informed this was her third no show appointment and that she would need to get a new referral for PT from her doctor if she would like to receive therapy services. Pt states she understand and she will talk with doctor if she wants to continue with therapy. Pt to be discharged at this time.   Armond Bertin, PT, DPT [facility] Office: [phone] 12:02 PM, 10/14/23   [facility] Outpatient Rehabilitation at [facility] [address] [place], [place], [address] Phone: [phone]   Fax:  [phone]

## 2023-10-17 ENCOUNTER — Ambulatory Visit: Payer: Self-pay | Admitting: Family Medicine

## 2023-10-21 ENCOUNTER — Encounter (HOSPITAL_COMMUNITY): Admitting: Physical Therapy

## 2023-10-28 ENCOUNTER — Ambulatory Visit (HOSPITAL_BASED_OUTPATIENT_CLINIC_OR_DEPARTMENT_OTHER): Admitting: Pharmacist Clinician (PhC)/ Clinical Pharmacy Specialist

## 2023-10-28 ENCOUNTER — Encounter (HOSPITAL_BASED_OUTPATIENT_CLINIC_OR_DEPARTMENT_OTHER): Payer: Self-pay

## 2023-10-28 VITALS — BP 142/68 | HR 72 | Ht 62.0 in

## 2023-10-28 DIAGNOSIS — I10 Essential (primary) hypertension: Secondary | ICD-10-CM

## 2023-10-28 NOTE — Patient Instructions (Signed)
 Follow up appointment: if you notice your BP starting to increase at dialysis appointments  Take your BP meds as follows:  restart clonidine  patches, apply once weekly  Check your blood pressure at home daily (if able) and keep record of the readings.  Your blood pressure goal is < 130/80  To check your pressure at home you will need to:  1. Sit up in a chair, with feet flat on the floor and back supported. Do not cross your ankles or legs. 2. Rest your left arm so that the cuff is about heart level. If the cuff goes on your upper arm,  then just relax the arm on the table, arm of the chair or your lap. If you have a wrist cuff, we  suggest relaxing your wrist against your chest (think of it as Pledging the Flag with the  wrong arm).  3. Place the cuff snugly around your arm, about 1 inch above the crook of your elbow. The  cords should be inside the groove of your elbow.  4. Sit quietly, with the cuff in place, for about 5 minutes. After that 5 minutes press the power  button to start a reading. 5. Do not talk or move while the reading is taking place.  6. Record your readings on a sheet of paper. Although most cuffs have a memory, it is often  easier to see a pattern developing when the numbers are all in front of you.  7. You can repeat the reading after 1-3 minutes if it is recommended  Make sure your bladder is empty and you have not had caffeine or tobacco within the last 30 min  Always bring your blood pressure log with you to your appointments. If you have not brought your monitor in to be double checked for accuracy, please bring it to your next appointment.  You can find a list of quality blood pressure cuffs at [email or web address]  Important lifestyle changes to control high blood pressure  Intervention  Effect on the BP  Lose extra pounds and watch your waistline Weight loss is one of the most effective lifestyle changes for controlling blood pressure. If you're overweight or  obese, losing even a small amount of weight can help reduce blood pressure. Blood pressure might go down by about 1 millimeter of mercury (mm Hg) with each kilogram (about 2.2 pounds) of weight lost.  Exercise regularly As a general goal, aim for at least 30 minutes of moderate physical activity every day. Regular physical activity can lower high blood pressure by about 5 to 8 mm Hg.  Eat a healthy diet Eating a diet rich in whole grains, fruits, vegetables, and low-fat dairy products and low in saturated fat and cholesterol. A healthy diet can lower high blood pressure by up to 11 mm Hg.  Reduce salt (sodium) in your diet Even a small reduction of sodium in the diet can improve heart health and reduce high blood pressure by about 5 to 6 mm Hg.  Limit alcohol One drink equals 12 ounces of beer, 5 ounces of wine, or 1.5 ounces of 80-proof liquor.  Limiting alcohol to less than one drink a day for women or two drinks a day for men can help lower blood pressure by about 4 mm Hg.   If you have any questions or concerns please use My Chart to send questions or call the office at [phone]

## 2023-10-28 NOTE — Assessment & Plan Note (Signed)
 Assessment: BP is uncontrolled in office BP 142/68 mmHg;  above the goal (<130/80). Has not had clonidine  patch for since hospitalization last month Tolerates amlodipine , carvedilol , irbesartan  well, without any side effects Denies SOB, palpitation, chest pain, headaches,or swelling Reiterated the importance of regular exercise and low salt diet   Plan:  Restart clonidine  patches today Continue taking amlodipine  5 mg MWF, 10 mg other days, carvedilol  12.5 mg bid, irbesartan  150 mg every day,  Patient to follow up with cardiology if needs further assistance with BP

## 2023-10-28 NOTE — Progress Notes (Signed)
 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 Taking it along with moxifloxacin   Moxifloxacin Other (See Comments)    Patient self-discontinued treatment due to GI intolerance. Taking it along with linezolid   Quinine Derivatives Other (See Comments)    Alters mental status   Vancomycin  Other (See Comments)    Pt is tolerating this medication at HD   Azithromycin  Itching and Rash   Tetracycline Itching    Able to tolerate Doxycycline .    Zosyn  [Piperacillin  Sod-Tazobactam So] Rash    History of Present Illness    [name] is a 58 y.o. female patient who was referred to the Advanced Hypertension Clinic by [name] [name] FNP.  Reports having hypertension for many years, now, but believes readings to be mostly normal.  Has pressure checked regularly at dialysis, but cannot recall specifics.   Notes that they have not had to stop because of hypotension, nor have they told her readings are elevated.  At her most recent visit, in April with Caitlin, clonidine  tablets were switched to the 0.1 mg patch.     She was hospitalized in late April for pneumonia and hypoxic respiratory failure, then again a few days later for encephalopathy (unsure if antibiotic reaction or seizure).  At this admit, she notes her clonidine  patch was removed, and she has not put on another since then.   She is not familiar with her medications or dosages, her daughter is  apparently in charge of giving meds.    Blood Pressure Goal:  130/80  Current Medications: amlodipine  5 mg MWF, 10 mg other days, carvedilol  12.5 mg bid, irbesartan  150 mg every day,   Diet:   daughter cooks;  proteins mostly chicken, pork, beef; vegetables include green beans, corn, (both canned) broccoli; snakcing on chips. crackers  Exercise: no - parapalegic  Home BP readings:   no readings, states they appear to be fine at dialysis    Accessory Clinical Findings    Lab Results  Component Value Date   CREATININE 6.40 (H) 09/29/2023   BUN 57 (H) 09/29/2023   NA 134 (L) 09/29/2023   K 4.3 09/29/2023   CL 92 (L) 09/29/2023   CO2 23 09/29/2023   Lab Results  Component Value Date   ALT 15 09/26/2023   AST 18 09/26/2023   ALKPHOS 81 09/26/2023   BILITOT 0.7 09/26/2023   No results found for: "HGBA1C"  Screening for Secondary Hypertension:      Relevant Labs/Studies:    Latest Ref Rng & Units 09/29/2023    8:04 AM 09/27/2023    7:03 AM 09/26/2023    9:21 AM  Basic Labs  Sodium 135 - 145 mmol/L 134  134  132   Potassium 3.5 - 5.1 mmol/L 4.3  4.6  4.1   Creatinine 0.44 - 1.00 mg/dL 0.98  1.19  1.47        Latest Ref Rng & Units  09/26/2023    6:30 PM 06/22/2016    1:47 PM  Thyroid   TSH 0.350 - 4.500 uIU/mL 2.393  6.183              Latest Ref Rng & Units 05/21/2013    6:06 PM  Cortisol  Cortisol  ug/dL 16.1          Home Medications    Current Outpatient Medications  Medication Sig Dispense Refill   acetaminophen  (TYLENOL ) 500 MG tablet Take 1,000 mg by mouth every 6 (six) hours as needed for moderate pain.     albuterol  (PROVENTIL ) (2.5 MG/3ML) 0.083% nebulizer solution Take 3 mLs (2.5 mg total) by nebulization every 4 (four) hours as needed for wheezing or shortness of breath. 75 mL 2   amLODipine  (NORVASC ) 10 MG tablet Take 1 tablet (10 mg total) by mouth every evening. Take in the evenings -- (Patient taking differently: Take 5 mg by mouth every evening.  Take in the evenings --) 30 tablet 3   carvedilol  (COREG ) 12.5 MG tablet Take 1 tablet (12.5 mg total) by mouth 2 (two) times daily with a meal. 180 tablet 3   cloNIDine  (CATAPRES  - DOSED IN MG/24 HR) 0.1 mg/24hr patch Place 1 patch (0.1 mg total) onto the skin once a week. 4 patch 12   cyclobenzaprine  (FLEXERIL ) 5 MG tablet Take 5 mg by mouth 2 (two) times daily as needed for muscle spasms.     feeding supplement (ENSURE ENLIVE / ENSURE PLUS) LIQD Take 237 mLs by mouth 2 (two) times daily between meals. 14220 mL 2   furosemide  (LASIX ) 80 MG tablet Take 1 tablet (80 mg total) by mouth every Tuesday, Thursday, Saturday, and Sunday. 45 tablet 3   gabapentin  (NEURONTIN ) 100 MG capsule Take 1 capsule (100 mg total) by mouth 2 (two) times daily. 60 capsule 0   irbesartan  (AVAPRO ) 150 MG tablet Take 1 tablet (150 mg total) by mouth daily. Take 150 mg by mouth daily. 90 tablet 1   levETIRAcetam  (KEPPRA ) 500 MG tablet Take 2 tablets (1,000 mg total) by mouth every evening. Take an additional tablet on Monday,Wednesday and Friday evening after Dialysis 90 tablet 3   lidocaine -prilocaine  (EMLA ) cream Apply 1 Application topically 3 (three) times a week.     multivitamin (RENA-VIT) TABS tablet Take 1 tablet by mouth daily.     pantoprazole  (PROTONIX ) 40 MG tablet Take 1 tablet (40 mg total) by mouth daily. 30 tablet 0   senna-docusate (SENOKOT-S) 8.6-50 MG tablet Take 2 tablets by mouth at bedtime. 60 tablet 3   sevelamer  carbonate (RENVELA ) 800 MG tablet Take 800 mg by mouth 3 (three) times daily.     No current facility-administered medications for this visit.     Assessment & Plan   HYPERTENSION CONTROL Vitals:   10/28/23 1331 10/28/23 1337  BP: (!) 148/74 (!) 142/68    The patient's blood pressure is elevated above target today.  In order to address the patient's elevated BP: A current anti-hypertensive medication was adjusted today.      Essential hypertension Assessment: BP is  uncontrolled in office BP 142/68 mmHg;  above the goal (<130/80). Has not had clonidine  patch for since hospitalization last month Tolerates amlodipine , carvedilol , irbesartan  well, without any side effects Denies SOB, palpitation, chest pain, headaches,or swelling Reiterated the importance of regular exercise and low salt diet   Plan:  Restart clonidine  patches today Continue taking amlodipine  5 mg MWF, 10 mg other days, carvedilol  12.5 mg bid,  irbesartan  150 mg every day,  Patient to follow up with cardiology if needs further assistance with BP    Donivan Furry PharmD CPP [facility]  [address] [place], [place] [address] [phone]

## 2023-10-30 ENCOUNTER — Encounter (HOSPITAL_COMMUNITY)

## 2023-11-04 ENCOUNTER — Other Ambulatory Visit: Payer: Self-pay | Admitting: Family Medicine

## 2023-11-04 ENCOUNTER — Inpatient Hospital Stay: Payer: Self-pay

## 2023-11-04 DIAGNOSIS — K219 Gastro-esophageal reflux disease without esophagitis: Secondary | ICD-10-CM

## 2023-11-11 ENCOUNTER — Other Ambulatory Visit: Payer: Self-pay | Admitting: Family Medicine

## 2023-11-20 ENCOUNTER — Telehealth: Payer: Self-pay

## 2023-11-20 ENCOUNTER — Ambulatory Visit: Admitting: Family Medicine

## 2023-11-20 ENCOUNTER — Ambulatory Visit (INDEPENDENT_AMBULATORY_CARE_PROVIDER_SITE_OTHER): Admitting: Internal Medicine

## 2023-11-20 ENCOUNTER — Emergency Department (HOSPITAL_COMMUNITY)

## 2023-11-20 ENCOUNTER — Other Ambulatory Visit: Payer: Self-pay

## 2023-11-20 ENCOUNTER — Encounter: Payer: Self-pay | Admitting: Internal Medicine

## 2023-11-20 ENCOUNTER — Inpatient Hospital Stay (HOSPITAL_COMMUNITY)
Admission: EM | Admit: 2023-11-20 | Discharge: 2023-11-24 | DRG: 592 | Disposition: A | Discharge status: Home Health | Source: Ambulatory Visit | Attending: Family Medicine | Admitting: Family Medicine

## 2023-11-20 ENCOUNTER — Encounter (HOSPITAL_COMMUNITY): Payer: Self-pay

## 2023-11-20 VITALS — BP 105/64 | HR 82 | Ht 62.0 in | Wt 95.0 lb

## 2023-11-20 DIAGNOSIS — L89309 Pressure ulcer of unspecified buttock, unspecified stage: Secondary | ICD-10-CM | POA: Diagnosis present

## 2023-11-20 DIAGNOSIS — Z960 Presence of urogenital implants: Secondary | ICD-10-CM | POA: Diagnosis present

## 2023-11-20 DIAGNOSIS — I5032 Chronic diastolic (congestive) heart failure: Secondary | ICD-10-CM | POA: Diagnosis present

## 2023-11-20 DIAGNOSIS — J189 Pneumonia, unspecified organism: Secondary | ICD-10-CM | POA: Insufficient documentation

## 2023-11-20 DIAGNOSIS — G8220 Paraplegia, unspecified: Secondary | ICD-10-CM

## 2023-11-20 DIAGNOSIS — Z7401 Bed confinement status: Secondary | ICD-10-CM

## 2023-11-20 DIAGNOSIS — U071 COVID-19: Secondary | ICD-10-CM | POA: Diagnosis present

## 2023-11-20 DIAGNOSIS — E875 Hyperkalemia: Secondary | ICD-10-CM | POA: Diagnosis not present

## 2023-11-20 DIAGNOSIS — J9601 Acute respiratory failure with hypoxia: Secondary | ICD-10-CM | POA: Diagnosis not present

## 2023-11-20 DIAGNOSIS — Z87898 Personal history of other specified conditions: Secondary | ICD-10-CM

## 2023-11-20 DIAGNOSIS — M25451 Effusion, right hip: Secondary | ICD-10-CM | POA: Diagnosis present

## 2023-11-20 DIAGNOSIS — E871 Hypo-osmolality and hyponatremia: Secondary | ICD-10-CM | POA: Diagnosis not present

## 2023-11-20 DIAGNOSIS — L89324 Pressure ulcer of left buttock, stage 4: Secondary | ICD-10-CM

## 2023-11-20 DIAGNOSIS — Z803 Family history of malignant neoplasm of breast: Secondary | ICD-10-CM

## 2023-11-20 DIAGNOSIS — K219 Gastro-esophageal reflux disease without esophagitis: Secondary | ICD-10-CM

## 2023-11-20 DIAGNOSIS — Z992 Dependence on renal dialysis: Secondary | ICD-10-CM

## 2023-11-20 DIAGNOSIS — N186 End stage renal disease: Secondary | ICD-10-CM | POA: Diagnosis present

## 2023-11-20 DIAGNOSIS — L8990 Pressure ulcer of unspecified site, unspecified stage: Secondary | ICD-10-CM | POA: Diagnosis not present

## 2023-11-20 DIAGNOSIS — Z888 Allergy status to other drugs, medicaments and biological substances status: Secondary | ICD-10-CM

## 2023-11-20 DIAGNOSIS — L89229 Pressure ulcer of left hip, unspecified stage: Secondary | ICD-10-CM | POA: Diagnosis present

## 2023-11-20 DIAGNOSIS — L02419 Cutaneous abscess of limb, unspecified: Secondary | ICD-10-CM | POA: Diagnosis present

## 2023-11-20 DIAGNOSIS — D631 Anemia in chronic kidney disease: Secondary | ICD-10-CM | POA: Diagnosis present

## 2023-11-20 DIAGNOSIS — Z9359 Other cystostomy status: Secondary | ICD-10-CM

## 2023-11-20 DIAGNOSIS — L89329 Pressure ulcer of left buttock, unspecified stage: Secondary | ICD-10-CM | POA: Diagnosis not present

## 2023-11-20 DIAGNOSIS — L02416 Cutaneous abscess of left lower limb: Secondary | ICD-10-CM | POA: Diagnosis present

## 2023-11-20 DIAGNOSIS — L89899 Pressure ulcer of other site, unspecified stage: Secondary | ICD-10-CM | POA: Diagnosis present

## 2023-11-20 DIAGNOSIS — Z79899 Other long term (current) drug therapy: Secondary | ICD-10-CM

## 2023-11-20 DIAGNOSIS — T148XXS Other injury of unspecified body region, sequela: Secondary | ICD-10-CM

## 2023-11-20 DIAGNOSIS — N319 Neuromuscular dysfunction of bladder, unspecified: Secondary | ICD-10-CM | POA: Diagnosis present

## 2023-11-20 DIAGNOSIS — L8940 Pressure ulcer of contiguous site of back, buttock and hip, unspecified stage: Secondary | ICD-10-CM | POA: Diagnosis not present

## 2023-11-20 DIAGNOSIS — Z881 Allergy status to other antibiotic agents status: Secondary | ICD-10-CM

## 2023-11-20 DIAGNOSIS — N2581 Secondary hyperparathyroidism of renal origin: Secondary | ICD-10-CM | POA: Diagnosis present

## 2023-11-20 DIAGNOSIS — J449 Chronic obstructive pulmonary disease, unspecified: Secondary | ICD-10-CM | POA: Diagnosis present

## 2023-11-20 DIAGNOSIS — Z8249 Family history of ischemic heart disease and other diseases of the circulatory system: Secondary | ICD-10-CM

## 2023-11-20 DIAGNOSIS — L89156 Pressure-induced deep tissue damage of sacral region: Secondary | ICD-10-CM | POA: Diagnosis present

## 2023-11-20 DIAGNOSIS — G40909 Epilepsy, unspecified, not intractable, without status epilepticus: Secondary | ICD-10-CM | POA: Diagnosis present

## 2023-11-20 DIAGNOSIS — Z833 Family history of diabetes mellitus: Secondary | ICD-10-CM

## 2023-11-20 DIAGNOSIS — I10 Essential (primary) hypertension: Secondary | ICD-10-CM | POA: Diagnosis present

## 2023-11-20 DIAGNOSIS — I132 Hypertensive heart and chronic kidney disease with heart failure and with stage 5 chronic kidney disease, or end stage renal disease: Secondary | ICD-10-CM | POA: Diagnosis present

## 2023-11-20 LAB — CBC WITH DIFFERENTIAL/PLATELET
Abs Immature Granulocytes: 0.12 10*3/uL — ABNORMAL HIGH (ref 0.00–0.07)
Basophils Absolute: 0.1 10*3/uL (ref 0.0–0.1)
Basophils Relative: 0 %
Eosinophils Absolute: 0.2 10*3/uL (ref 0.0–0.5)
Eosinophils Relative: 1 %
HCT: 28.7 % — ABNORMAL LOW (ref 36.0–46.0)
Hemoglobin: 8.2 g/dL — ABNORMAL LOW (ref 12.0–15.0)
Immature Granulocytes: 1 %
Lymphocytes Relative: 7 %
Lymphs Abs: 1 10*3/uL (ref 0.7–4.0)
MCH: 28.7 pg (ref 26.0–34.0)
MCHC: 28.6 g/dL — ABNORMAL LOW (ref 30.0–36.0)
MCV: 100.3 fL — ABNORMAL HIGH (ref 80.0–100.0)
Monocytes Absolute: 0.7 10*3/uL (ref 0.1–1.0)
Monocytes Relative: 5 %
Neutro Abs: 11.9 10*3/uL — ABNORMAL HIGH (ref 1.7–7.7)
Neutrophils Relative %: 86 %
Platelets: 374 10*3/uL (ref 150–400)
RBC: 2.86 MIL/uL — ABNORMAL LOW (ref 3.87–5.11)
RDW: 17.7 % — ABNORMAL HIGH (ref 11.5–15.5)
WBC: 14 10*3/uL — ABNORMAL HIGH (ref 4.0–10.5)
nRBC: 0 % (ref 0.0–0.2)

## 2023-11-20 LAB — TYPE AND SCREEN
ABO/RH(D): A POS
Antibody Screen: NEGATIVE

## 2023-11-20 LAB — COMPREHENSIVE METABOLIC PANEL WITH GFR
ALT: 17 U/L (ref 0–44)
AST: 20 U/L (ref 15–41)
Albumin: 2.4 g/dL — ABNORMAL LOW (ref 3.5–5.0)
Alkaline Phosphatase: 55 U/L (ref 38–126)
Anion gap: 14 (ref 5–15)
BUN: 36 mg/dL — ABNORMAL HIGH (ref 6–20)
CO2: 27 mmol/L (ref 22–32)
Calcium: 9.1 mg/dL (ref 8.9–10.3)
Chloride: 94 mmol/L — ABNORMAL LOW (ref 98–111)
Creatinine, Ser: 3.77 mg/dL — ABNORMAL HIGH (ref 0.44–1.00)
GFR, Estimated: 14 mL/min — ABNORMAL LOW (ref >60)
Glucose, Bld: 104 mg/dL — ABNORMAL HIGH (ref 70–99)
Potassium: 3.2 mmol/L — ABNORMAL LOW (ref 3.5–5.1)
Sodium: 135 mmol/L (ref 135–145)
Total Bilirubin: 0.8 mg/dL (ref 0.0–1.2)
Total Protein: 7.8 g/dL (ref 6.5–8.1)

## 2023-11-20 LAB — BRAIN NATRIURETIC PEPTIDE: B Natriuretic Peptide: 452 pg/mL — ABNORMAL HIGH (ref 0.0–100.0)

## 2023-11-20 LAB — RESP PANEL BY RT-PCR (RSV, FLU A&B, COVID)  RVPGX2
Influenza A by PCR: NEGATIVE
Influenza B by PCR: NEGATIVE
Resp Syncytial Virus by PCR: NEGATIVE
SARS Coronavirus 2 by RT PCR: POSITIVE — AB

## 2023-11-20 LAB — PROTIME-INR
INR: 1.2 (ref 0.8–1.2)
Prothrombin Time: 15.7 s — ABNORMAL HIGH (ref 11.4–15.2)

## 2023-11-20 LAB — MAGNESIUM: Magnesium: 1.9 mg/dL (ref 1.7–2.4)

## 2023-11-20 LAB — LACTIC ACID, PLASMA
Lactic Acid, Venous: 0.9 mmol/L (ref 0.5–1.9)
Lactic Acid, Venous: 1 mmol/L (ref 0.5–1.9)

## 2023-11-20 LAB — TROPONIN I (HIGH SENSITIVITY)
Troponin I (High Sensitivity): 79 ng/L — ABNORMAL HIGH (ref <18)
Troponin I (High Sensitivity): 54 ng/L — ABNORMAL HIGH (ref <18)

## 2023-11-20 MED ORDER — ALBUTEROL SULFATE (2.5 MG/3ML) 0.083% IN NEBU: 2.5000 mg | INHALATION_SOLUTION | RESPIRATORY_TRACT | Status: DC | PRN

## 2023-11-20 MED ORDER — NIRMATRELVIR/RITONAVIR (PAXLOVID) TABLET (RENAL DOSING): 2.0000 | ORAL_TABLET | Freq: Two times a day (BID) | ORAL | Status: DC

## 2023-11-20 MED ORDER — IRBESARTAN 150 MG PO TABS
150.0000 mg | ORAL_TABLET | Freq: Every day | ORAL | Status: DC
  Administered 2023-11-21 – 2023-11-23 (×3): 150 mg via ORAL
  Filled 2023-11-20 (×4): qty 1

## 2023-11-20 MED ORDER — SODIUM CHLORIDE 0.9 % IV SOLN: INTRAVENOUS | Status: AC | PRN

## 2023-11-20 MED ORDER — AMOXICILLIN-POT CLAVULANATE 250-125 MG PO TABS: ORAL_TABLET | ORAL | 0 refills | Status: DC

## 2023-11-20 MED ORDER — SENNOSIDES-DOCUSATE SODIUM 8.6-50 MG PO TABS
2.0000 | ORAL_TABLET | Freq: Every day | ORAL | Status: DC
  Administered 2023-11-20 – 2023-11-23 (×4): 2 via ORAL
  Filled 2023-11-20 (×4): qty 2

## 2023-11-20 MED ORDER — SODIUM CHLORIDE 0.9 % IV SOLN: 2.0000 g | INTRAVENOUS | Status: DC

## 2023-11-20 MED ORDER — PANTOPRAZOLE SODIUM 40 MG PO TBEC
40.0000 mg | DELAYED_RELEASE_TABLET | Freq: Every day | ORAL | Status: DC
  Administered 2023-11-21 – 2023-11-24 (×4): 40 mg via ORAL
  Filled 2023-11-20 (×4): qty 1

## 2023-11-20 MED ORDER — ACETAMINOPHEN 325 MG PO TABS
650.0000 mg | ORAL_TABLET | Freq: Four times a day (QID) | ORAL | Status: DC | PRN
Start: 2023-11-20 — End: 2023-11-25
  Administered 2023-11-21 – 2023-11-23 (×3): 650 mg via ORAL
  Filled 2023-11-20 (×3): qty 2

## 2023-11-20 MED ORDER — LIDOCAINE-PRILOCAINE 2.5-2.5 % EX CREA
1.0000 | TOPICAL_CREAM | CUTANEOUS | Status: DC
  Administered 2023-11-21 – 2023-11-24 (×2): 1 via TOPICAL
  Filled 2023-11-20 (×2): qty 5

## 2023-11-20 MED ORDER — POLYETHYLENE GLYCOL 3350 17 G PO PACK: 17.0000 g | PACK | Freq: Every day | ORAL | Status: DC | PRN

## 2023-11-20 MED ORDER — POTASSIUM CHLORIDE CRYS ER 20 MEQ PO TBCR
40.0000 meq | EXTENDED_RELEASE_TABLET | Freq: Once | ORAL | Status: AC
  Administered 2023-11-20: 40 meq via ORAL
  Filled 2023-11-20: qty 2

## 2023-11-20 MED ORDER — ACETAMINOPHEN 650 MG RE SUPP: 650.0000 mg | Freq: Four times a day (QID) | RECTAL | Status: DC | PRN

## 2023-11-20 MED ORDER — LEVETIRACETAM 500 MG PO TABS: 500.0000 mg | ORAL_TABLET | ORAL | Status: DC

## 2023-11-20 MED ORDER — ENSURE ENLIVE PO LIQD
237.0000 mL | Freq: Three times a day (TID) | ORAL | Status: DC
  Administered 2023-11-21 – 2023-11-24 (×5): 237 mL via ORAL
  Filled 2023-11-20 (×17): qty 237

## 2023-11-20 MED ORDER — SANTYL 250 UNIT/GM EX OINT: 1.0000 | TOPICAL_OINTMENT | Freq: Every day | CUTANEOUS | 0 refills | Status: DC

## 2023-11-20 MED ORDER — AMLODIPINE BESYLATE 5 MG PO TABS
10.0000 mg | ORAL_TABLET | Freq: Every evening | ORAL | Status: DC
  Administered 2023-11-21 – 2023-11-23 (×3): 10 mg via ORAL
  Filled 2023-11-20 (×3): qty 2

## 2023-11-20 MED ORDER — LEVETIRACETAM 500 MG PO TABS
1000.0000 mg | ORAL_TABLET | Freq: Every evening | ORAL | Status: DC
  Administered 2023-11-20 – 2023-11-23 (×4): 1000 mg via ORAL
  Filled 2023-11-20 (×4): qty 2

## 2023-11-20 MED ORDER — IOHEXOL 350 MG/ML SOLN
75.0000 mL | Freq: Once | INTRAVENOUS | Status: AC | PRN
  Administered 2023-11-20: 75 mL via INTRAVENOUS

## 2023-11-20 MED ORDER — SEVELAMER CARBONATE 800 MG PO TABS
800.0000 mg | ORAL_TABLET | Freq: Three times a day (TID) | ORAL | Status: DC
Start: 2023-11-20 — End: 2023-11-25
  Administered 2023-11-20 – 2023-11-24 (×11): 800 mg via ORAL
  Filled 2023-11-20 (×11): qty 1

## 2023-11-20 MED ORDER — VANCOMYCIN HCL 500 MG/100ML IV SOLN
500.0000 mg | INTRAVENOUS | Status: DC
  Administered 2023-11-21 – 2023-11-24 (×2): 500 mg via INTRAVENOUS
  Filled 2023-11-20 (×3): qty 100

## 2023-11-20 MED ORDER — GABAPENTIN 100 MG PO CAPS
100.0000 mg | ORAL_CAPSULE | Freq: Two times a day (BID) | ORAL | Status: DC
  Administered 2023-11-20 – 2023-11-24 (×8): 100 mg via ORAL
  Filled 2023-11-20 (×8): qty 1

## 2023-11-20 MED ORDER — ONDANSETRON HCL 4 MG/2ML IJ SOLN: 4.0000 mg | Freq: Four times a day (QID) | INTRAMUSCULAR | Status: DC | PRN

## 2023-11-20 MED ORDER — VANCOMYCIN HCL IN DEXTROSE 1-5 GM/200ML-% IV SOLN
1000.0000 mg | Freq: Once | INTRAVENOUS | Status: AC
  Administered 2023-11-20: 1000 mg via INTRAVENOUS
  Filled 2023-11-20: qty 200

## 2023-11-20 MED ORDER — RENA-VITE PO TABS
1.0000 | ORAL_TABLET | Freq: Every day | ORAL | Status: DC
  Administered 2023-11-21 – 2023-11-24 (×4): 1 via ORAL
  Filled 2023-11-20 (×4): qty 1

## 2023-11-20 MED ORDER — CARVEDILOL 12.5 MG PO TABS
12.5000 mg | ORAL_TABLET | Freq: Two times a day (BID) | ORAL | Status: DC
  Administered 2023-11-21 – 2023-11-23 (×6): 12.5 mg via ORAL
  Filled 2023-11-20 (×6): qty 1

## 2023-11-20 MED ORDER — ONDANSETRON HCL 4 MG PO TABS: 4.0000 mg | ORAL_TABLET | Freq: Four times a day (QID) | ORAL | Status: DC | PRN

## 2023-11-20 NOTE — Assessment & Plan Note (Addendum)
 Chronic decubitus ulcer.  Daughter noticed black discoloration today.  On exam no sign of cellulitis.  Most of the wounds have significantly improved/healed considering prior exam.  WBC of 14.  Rules out for sepsis.  Normal lactic acid 1.  CT abdomen and pelvis with contrast today- There is a decubitus ulcer in the posterior aspect of the upper left thigh reaching up to the bone surface. There is associated small walled-off abscess/collection in the soft tissue measuring up to 2.0 x 3.3 cm. - IV Vanco and ceftriaxone  - Gen surgery consulted will see in consult in a.m, asked me to reach out to Ortho. - I reached out to Dr. Margrette- as no bone involvement, hence nothing for ortho to offer

## 2023-11-20 NOTE — Assessment & Plan Note (Addendum)
 O2 sats 91% on room air, placed on 2 L.  She reports some dyspnea and weakness over the past 3 days.  CTA chest negative for PE, or other lung pathology. - Per pharmacy,paxlovid not stocked at [facility], may consider on discharge if still indicated. - Supplemental O2 - Nebs as needed - No steroids as chest is clear on imaging

## 2023-11-20 NOTE — Assessment & Plan Note (Signed)
 Schedule Monday Wednesday Friday.  Reports compliance with HD sessions. -Nephrology aware patient is being admitted

## 2023-11-20 NOTE — Consult Note (Addendum)
 Pharmacy Antibiotic Note  [name] is a 58 y.o. female admitted on 11/20/2023 with Decubitus abscess.  Pharmacy has been consulted for vancomycin  dosing. COVID positive.   Plan: Will give vancomycin  loading dose of 1000 mg x 1 followed by 500 mg on HD days. (MWF). Plan to order vancomycin  prior to the 3rd dose. Allergy listed for vancomycin , but per chart it say pt was tolerating at HD.   Height: 5' 2 (157.5 cm) Weight: 46.4 kg (102 lb 4.7 oz) IBW/kg (Calculated) : 50.1  Temp (24hrs), Avg:98.5 F (36.9 C), Min:98.5 F (36.9 C), Max:98.5 F (36.9 C)  Recent Labs  Lab 11/20/23 1250 11/20/23 1445  WBC 14.0*  --   CREATININE 3.77*  --   LATICACIDVEN 0.9 1.0    Estimated Creatinine Clearance: 12.4 mL/min (A) (by C-G formula based on SCr of 3.77 mg/dL (H)).    Allergies  Allergen Reactions   Cefepime  Other (See Comments)    Severe AMS 09/2023 admission   Linezolid Other (See Comments)    Patient self-discontinued treatment due to GI intolerance. Taking it along with moxifloxacin   Moxifloxacin Other (See Comments)    Patient self-discontinued treatment due to GI intolerance. Taking it along with linezolid   Quinine Derivatives Other (See Comments)    Alters mental status   Vancomycin  Other (See Comments)    Unknown  Pt is tolerating this medication at HD   Azithromycin  Itching and Rash   Benadryl [Diphenhydramine Hcl (Sleep)] Hives   Daptomycin Hives   Tetracycline Itching    Able to tolerate Doxycycline .    Zosyn  [Piperacillin  Sod-Tazobactam So] Rash    Antimicrobials this admission: 6/26 ceftriaxone  >>  6/26 vancomycin  >>   Dose adjustments this admission: None  Microbiology results: 6/26 BCx: pending   Thank you for allowing pharmacy to be a part of this patient's care.  Elric Tirado S Angalina Ante 11/20/2023 7:48 PM

## 2023-11-20 NOTE — Assessment & Plan Note (Signed)
 Had oxygen saturation at 87% on initial evaluation Also reports chills and dyspnea She initially referred to outpatient care, but later agreed to go to ER for further evaluation

## 2023-11-20 NOTE — ED Provider Notes (Signed)
 [place] EMERGENCY DEPARTMENT AT [facility] Provider Note   CSN: [id number] Arrival date & time: 11/20/23  1216     Patient presents with: hypoxia   [name] is a 58 y.o. female.  She is brought in from her primary care doctor's office for evaluation of worsening pelvic wounds and hypoxia.  She has a history of end-stage renal disease and gets dialysis Monday Wednesday Friday, had dialysis yesterday.  She said it was complicated by some transient low blood pressures.  Today she was noted in the office to be hypoxic.  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  Goes to wound clinic once every 6 weeks.  Daughter does the dressing changes and she has noticed that the left hip wound is deeper and now black.  No significant drainage.  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  She does endorse that sometimes she feels a little bit of shortness of breath.  No cough.  No fever but thinks there may have been a fever yesterday.  Has a chronic Foley catheter and makes a little bit a urine.   The history is provided by the patient and a relative.  Wound Check This is a chronic problem. The problem has been gradually worsening. Associated symptoms include shortness of breath. Pertinent negatives include no chest pain, no abdominal pain and no headaches. Nothing aggravates the symptoms. Nothing relieves the symptoms. Treatments tried: dressing changes. The treatment provided no relief.       Prior to Admission medications   Medication Sig Start Date End Date Taking? Authorizing Provider  acetaminophen  (TYLENOL ) 500 MG tablet Take 1,000 mg by mouth every 6 (six) hours as needed for moderate pain.    [provider]  albuterol  (PROVENTIL ) (2.5 MG/3ML) 0.083% nebulizer solution Take 3 mLs (2.5 mg total) by nebulization every 4 (four) hours as needed for wheezing or shortness of breath. 04/15/23  04/14/24  Pearlean Manus, MD  amLODipine  (NORVASC ) 10 MG tablet Take 1 tablet (10 mg total) by mouth every evening. Take in the evenings -- Patient taking differently: Take 5 mg by mouth every evening. Take in the evenings -- 09/25/23   Pearlean Manus, MD  amoxicillin -clavulanate (AUGMENTIN ) 250-125 MG tablet Take 1 tablet on non-dialysis days once daily, and take 1 tablet twice daily on dialysis days. 11/20/23   Tobie Suzzane POUR, MD  carvedilol  (COREG ) 12.5 MG tablet Take 1 tablet (12.5 mg total) by mouth 2 (two) times daily with a meal. 08/07/23 11/25/23  [name], [name], FNP  cloNIDine  (CATAPRES  - DOSED IN MG/24 HR) 0.1 mg/24hr patch Place 1 patch (0.1 mg total) onto the skin once a week. 09/18/23   Walker, Caitlin S, NP  collagenase (SANTYL) 250 UNIT/GM ointment Apply 1 Application topically daily. 11/20/23   Tobie Suzzane POUR, MD  cyclobenzaprine  (FLEXERIL ) 5 MG tablet Take 5 mg by mouth 2 (two) times daily as needed for muscle spasms. 04/11/23   [provider]  feeding supplement (ENSURE ENLIVE / ENSURE PLUS) LIQD Take 237 mLs by mouth 2 (two) times daily between meals. 09/30/23 12/29/23  Akula, Vijaya, MD  furosemide  (LASIX ) 80 MG tablet Take 1 tablet (80 mg total) by mouth every Tuesday, Thursday, Saturday, and Sunday. 09/25/23   Pearlean Manus, MD  gabapentin  (NEURONTIN ) 100 MG capsule Take 1 capsule (100 mg total) by mouth 2 (two) times daily. 11/11/23   [name], [name],  FNP  irbesartan  (AVAPRO ) 150 MG tablet Take 1 tablet (150 mg total) by mouth daily. Take 150 mg by mouth daily. 08/07/23   [name], [name], FNP  levETIRAcetam  (KEPPRA ) 500 MG tablet Take 2 tablets (1,000 mg total) by mouth every evening. Take an additional tablet on Monday,Wednesday and Friday evening after Dialysis 04/15/23   Pearlean Manus, MD  lidocaine -prilocaine  (EMLA ) cream Apply 1 Application topically 3 (three) times a week. 06/04/22   [provider]  multivitamin (RENA-VIT) TABS tablet Take 1 tablet by mouth  daily. 07/29/23   [provider]  pantoprazole  (PROTONIX ) 40 MG tablet Take 1 tablet (40 mg total) by mouth daily. 11/04/23   [name], [name], FNP  senna-docusate (SENOKOT-S) 8.6-50 MG tablet Take 2 tablets by mouth at bedtime. 11/12/22   Pearlean Manus, MD  sevelamer  carbonate (RENVELA ) 800 MG tablet Take 800 mg by mouth 3 (three) times daily. 12/11/22   [provider]    Allergies: Benadryl [diphenhydramine hcl (sleep)], Cefepime , Daptomycin, Linezolid, Moxifloxacin, Quinine derivatives, Vancomycin , Azithromycin , Tetracycline, and Zosyn  [piperacillin  sod-tazobactam so]    Review of Systems  Constitutional:  Positive for fever.  HENT:  Negative for sore throat.   Respiratory:  Positive for shortness of breath.   Cardiovascular:  Negative for chest pain.  Gastrointestinal:  Negative for abdominal pain.  Genitourinary:  Negative for dysuria.  Skin:  Positive for wound. Negative for rash.  Neurological:  Negative for headaches.    Updated Vital Signs BP 111/63 (BP Location: Right Arm)   Pulse 76   Temp 98.5 F (36.9 C)   Resp 17   Ht 5' 2 (1.575 m)   Wt 38.6 kg   LMP  (LMP Unknown)   SpO2 91%   BMI 15.55 kg/m   Physical Exam Vitals and nursing note reviewed.  Constitutional:      General: She is not in acute distress.    Appearance: Normal appearance. She is well-developed.  HENT:     Head: Normocephalic and atraumatic.   Eyes:     Conjunctiva/sclera: Conjunctivae normal.    Cardiovascular:     Rate and Rhythm: Normal rate and regular rhythm.     Heart sounds: Murmur heard.  Pulmonary:     Effort: Pulmonary effort is normal. No respiratory distress.     Breath sounds: Normal breath sounds. No stridor. No wheezing.  Abdominal:     Palpations: Abdomen is soft.     Tenderness: There is no abdominal tenderness. There is no guarding or rebound.  Genitourinary:    Comments: Chronic Foley catheter  Musculoskeletal:     Cervical back: Neck supple.      Comments: She has a fairly deep decubitus over her left buttock.  No significant drainage.  She has a more shallow decubitus on her right buttock.   Skin:    General: Skin is warm and dry.   Neurological:     Mental Status: She is alert.     GCS: GCS eye subscore is 4. GCS verbal subscore is 5. GCS motor subscore is 6.     Sensory: Sensory deficit present.     Motor: Weakness present.     Comments: Paraplegia with no use of her lower extremities.  She has fairly good use of her upper extremities.  Awake and alert    (all labs ordered are listed, but only abnormal results are displayed) Labs Reviewed  RESP PANEL BY RT-PCR (RSV, FLU A&B, COVID)  RVPGX2 - Abnormal; Notable for the following components:  Result Value   SARS Coronavirus 2 by RT PCR POSITIVE (*)    All other components within normal limits  COMPREHENSIVE METABOLIC PANEL WITH GFR - Abnormal; Notable for the following components:   Potassium 3.2 (*)    Chloride 94 (*)    Glucose, Bld 104 (*)    BUN 36 (*)    Creatinine, Ser 3.77 (*)    Albumin 2.4 (*)    GFR, Estimated 14 (*)    All other components within normal limits  BRAIN NATRIURETIC PEPTIDE - Abnormal; Notable for the following components:   B Natriuretic Peptide 452.0 (*)    All other components within normal limits  CBC WITH DIFFERENTIAL/PLATELET - Abnormal; Notable for the following components:   WBC 14.0 (*)    RBC 2.86 (*)    Hemoglobin 8.2 (*)    HCT 28.7 (*)    MCV 100.3 (*)    MCHC 28.6 (*)    RDW 17.7 (*)    Neutro Abs 11.9 (*)    Abs Immature Granulocytes 0.12 (*)    All other components within normal limits  PROTIME-INR - Abnormal; Notable for the following components:   Prothrombin Time 15.7 (*)    All other components within normal limits  TROPONIN I (HIGH SENSITIVITY) - Abnormal; Notable for the following components:   Troponin I (High Sensitivity) 79 (*)    All other components within normal limits  TROPONIN I (HIGH SENSITIVITY) -  Abnormal; Notable for the following components:   Troponin I (High Sensitivity) 54 (*)    All other components within normal limits  CULTURE, BLOOD (ROUTINE X 2)  CULTURE, BLOOD (ROUTINE X 2)  LACTIC ACID, PLASMA  LACTIC ACID, PLASMA  MAGNESIUM   URINALYSIS, W/ REFLEX TO CULTURE (INFECTION SUSPECTED)  POC OCCULT BLOOD, ED  TYPE AND SCREEN    EKG: EKG Interpretation Date/Time:  Thursday November 20 2023 12:26:43 EDT Ventricular Rate:  77 PR Interval:  140 QRS Duration:  118 QT Interval:  416 QTC Calculation: 470 R Axis:   -63  Text Interpretation: Normal sinus rhythm Incomplete right bundle branch block Left anterior fascicular block Nonspecific T wave abnormality Abnormal ECG When compared with ECG of 26-Sep-2023 09:26, No significant change since last tracing Confirmed by Towana Sharper [id number]) on 11/20/2023 12:27:50 PM  Radiology: CT Angio Chest PE W/Cm &/Or Wo Cm Result Date: 11/20/2023 CLINICAL DATA:  Pulmonary embolism (PE) suspected, high prob; worseing decub wounds. Shortness of breath. EXAM: CT ANGIOGRAPHY CHEST CT ABDOMEN AND PELVIS WITH CONTRAST TECHNIQUE: Multidetector CT imaging of the chest was performed using the standard protocol during bolus administration of intravenous contrast. Multiplanar CT image reconstructions and MIPs were obtained to evaluate the vascular anatomy. Multidetector CT imaging of the abdomen and pelvis was performed using the standard protocol during bolus administration of intravenous contrast. RADIATION DOSE REDUCTION: This exam was performed according to the departmental dose-optimization program which includes automated exposure control, adjustment of the mA and/or kV according to patient size and/or use of iterative reconstruction technique. CONTRAST:  75mL OMNIPAQUE  IOHEXOL  350 MG/ML SOLN COMPARISON:  CT scan chest from 07/10/2023 and CT Angiography GI bleed from 12/31/2022. FINDINGS: CTA CHEST FINDINGS Cardiovascular: Evaluation of pulmonary embolism  beyond the segmental branches is limited due to suboptimal contrast-enhancement. There is no embolism to the segmental pulmonary artery level. There is stable moderately enlarged cardiac size. No pericardial effusion. No aortic aneurysm. There are coronary artery calcifications, in keeping with coronary artery disease. There are also mild peripheral atherosclerotic vascular  calcifications of thoracic aorta and its major branches. 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 There multiple thin venous collaterals in the right upper hemithorax, similar to the prior study. Mediastinum/Nodes: Visualized thyroid gland appears grossly unremarkable. No solid / cystic mediastinal masses. The esophagus is nondistended precluding optimal assessment. No axillary, mediastinal or hilar lymphadenopathy by size criteria. Lungs/Pleura: The central tracheo-bronchial tree is patent. There are segmental atelectatic changes in the bilateral lower lobes, right more than left. There are patchy areas of linear, plate-like atelectasis and/or scarring throughout bilateral lungs. There is mosaic attenuation of lungs, consistent with heterogeneous air trapping related to small airways disease. No mass or consolidation. No pleural effusion or pneumothorax. No suspicious lung nodules. Musculoskeletal: The visualized soft tissues of the chest wall are grossly unremarkable. No suspicious osseous lesions. There are mild to moderate multilevel degenerative changes in the visualized spine. Redemonstration of patient's known posterior spinal fusion as well as anterolisthesis of T6 over T7, unchanged. Review of the MIP images confirms the above findings. CT ABDOMEN and PELVIS FINDINGS Hepatobiliary: The liver is normal in size. Non-cirrhotic configuration. No suspicious mass. No intrahepatic or extrahepatic bile duct dilation. No calcified gallstones. Normal  gallbladder wall thickness. No pericholecystic inflammatory changes. Pancreas: Unremarkable. No pancreatic ductal dilatation or surrounding inflammatory changes. Spleen: Within normal limits. No focal lesion. Adrenals/Urinary Tract: Adrenal glands are unremarkable. Redemonstration of small/atrophic bilateral kidneys, right more than left. There is a partially exophytic simple cyst arising from the left kidney upper pole measuring 1.1 x 1.4 cm. No nephroureterolithiasis on either side. Bilateral extrarenal pelves noted. No hydroureteronephrosis on either side. Urinary bladder is decompressed secondary to a suprapubic catheter. Stomach/Bowel: No disproportionate dilation of the small or large bowel loops. No evidence of abnormal bowel wall thickening or inflammatory changes. The appendix was not visualized; however there is no acute inflammatory process in the right lower quadrant. There is moderate stool burden. Vascular/Lymphatic: No ascites or pneumoperitoneum. No abdominal or pelvic lymphadenopathy, by size criteria. No aneurysmal dilation of the major abdominal arteries. There are mild peripheral atherosclerotic vascular calcifications of the aorta and its major branches. Reproductive: Not well evaluated on this exam. Anteverted uterus noted with endometrium measuring up to 6-7 mm in thickness. No large adnexal mass seen. Correlate clinically to determine the need for additional imaging with pelvic ultrasound. Other: There is soft tissue defect in the posterior aspect of upper left thigh with soft tissue defect reaching up to the proximal left femur bone surface. There is small walled-off abscess/collection in the soft tissue as well measuring at approximately 2.0 x 3.3 cm. There is an additional approximately 1.6 x 7.2 cm soft tissue mass with extensive internal dystrophic calcifications in the midline upper gluteal region, which is present since the prior study. There is small to moderate right hip joint  effusion with slightly hyperattenuating walls, raising the concern for chronic joint effusion versus septic joint. Correlate clinically. Musculoskeletal: No suspicious osseous lesions. There are mild multilevel degenerative changes in the visualized spine. Review of the MIP images confirms the above findings. IMPRESSION: 1. No embolism to the segmental pulmonary artery level. 2. There is a decubitus ulcer in the posterior aspect of the upper left thigh reaching up to the bone surface. There is associated small walled-off abscess/collection in the soft tissue measuring up to 2.0 x 3.3 cm. 3. There is a small to moderate right hip joint effusion with slightly hyperattenuating  walls, raising the concern for chronic joint effusion versus septic joint. Correlate clinically. 4. Multiple other nonacute observations, as described above. Electronically Signed   By: Ree Molt M.D.   On: 11/20/2023 14:49   CT ABDOMEN PELVIS W CONTRAST Result Date: 11/20/2023 CLINICAL DATA:  Pulmonary embolism (PE) suspected, high prob; worseing decub wounds. Shortness of breath. EXAM: CT ANGIOGRAPHY CHEST CT ABDOMEN AND PELVIS WITH CONTRAST TECHNIQUE: Multidetector CT imaging of the chest was performed using the standard protocol during bolus administration of intravenous contrast. Multiplanar CT image reconstructions and MIPs were obtained to evaluate the vascular anatomy. Multidetector CT imaging of the abdomen and pelvis was performed using the standard protocol during bolus administration of intravenous contrast. RADIATION DOSE REDUCTION: This exam was performed according to the departmental dose-optimization program which includes automated exposure control, adjustment of the mA and/or kV according to patient size and/or use of iterative reconstruction technique. CONTRAST:  75mL OMNIPAQUE  IOHEXOL  350 MG/ML SOLN COMPARISON:  CT scan chest from 07/10/2023 and CT Angiography GI bleed from 12/31/2022. FINDINGS: CTA CHEST FINDINGS  Cardiovascular: Evaluation of pulmonary embolism beyond the segmental branches is limited due to suboptimal contrast-enhancement. There is no embolism to the segmental pulmonary artery level. There is stable moderately enlarged cardiac size. No pericardial effusion. No aortic aneurysm. There are coronary artery calcifications, in keeping with coronary artery disease. There are also mild peripheral atherosclerotic vascular calcifications of thoracic aorta and its major branches. 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 There multiple thin venous collaterals in the right upper hemithorax, similar to the prior study. Mediastinum/Nodes: Visualized thyroid gland appears grossly unremarkable. No solid / cystic mediastinal masses. The esophagus is nondistended precluding optimal assessment. No axillary, mediastinal or hilar lymphadenopathy by size criteria. Lungs/Pleura: The central tracheo-bronchial tree is patent. There are segmental atelectatic changes in the bilateral lower lobes, right more than left. There are patchy areas of linear, plate-like atelectasis and/or scarring throughout bilateral lungs. There is mosaic attenuation of lungs, consistent with heterogeneous air trapping related to small airways disease. No mass or consolidation. No pleural effusion or pneumothorax. No suspicious lung nodules. Musculoskeletal: The visualized soft tissues of the chest wall are grossly unremarkable. No suspicious osseous lesions. There are mild to moderate multilevel degenerative changes in the visualized spine. Redemonstration of patient's known posterior spinal fusion as well as anterolisthesis of T6 over T7, unchanged. Review of the MIP images confirms the above findings. CT ABDOMEN and PELVIS FINDINGS Hepatobiliary: The liver is normal in size. Non-cirrhotic configuration. No suspicious mass. No intrahepatic or extrahepatic  bile duct dilation. No calcified gallstones. Normal gallbladder wall thickness. No pericholecystic inflammatory changes. Pancreas: Unremarkable. No pancreatic ductal dilatation or surrounding inflammatory changes. Spleen: Within normal limits. No focal lesion. Adrenals/Urinary Tract: Adrenal glands are unremarkable. Redemonstration of small/atrophic bilateral kidneys, right more than left. There is a partially exophytic simple cyst arising from the left kidney upper pole measuring 1.1 x 1.4 cm. No nephroureterolithiasis on either side. Bilateral extrarenal pelves noted. No hydroureteronephrosis on either side. Urinary bladder is decompressed secondary to a suprapubic catheter. Stomach/Bowel: No disproportionate dilation of the small or large bowel loops. No evidence of abnormal bowel wall thickening or inflammatory changes. The appendix was not visualized; however there is no acute inflammatory process in the right lower quadrant. There is moderate stool burden. Vascular/Lymphatic: No ascites or pneumoperitoneum. No abdominal or pelvic lymphadenopathy, by size criteria. No aneurysmal dilation of the major  abdominal arteries. There are mild peripheral atherosclerotic vascular calcifications of the aorta and its major branches. Reproductive: Not well evaluated on this exam. Anteverted uterus noted with endometrium measuring up to 6-7 mm in thickness. No large adnexal mass seen. Correlate clinically to determine the need for additional imaging with pelvic ultrasound. Other: There is soft tissue defect in the posterior aspect of upper left thigh with soft tissue defect reaching up to the proximal left femur bone surface. There is small walled-off abscess/collection in the soft tissue as well measuring at approximately 2.0 x 3.3 cm. There is an additional approximately 1.6 x 7.2 cm soft tissue mass with extensive internal dystrophic calcifications in the midline upper gluteal region, which is present since the prior  study. There is small to moderate right hip joint effusion with slightly hyperattenuating walls, raising the concern for chronic joint effusion versus septic joint. Correlate clinically. Musculoskeletal: No suspicious osseous lesions. There are mild multilevel degenerative changes in the visualized spine. Review of the MIP images confirms the above findings. IMPRESSION: 1. No embolism to the segmental pulmonary artery level. 2. There is a decubitus ulcer in the posterior aspect of the upper left thigh reaching up to the bone surface. There is associated small walled-off abscess/collection in the soft tissue measuring up to 2.0 x 3.3 cm. 3. There is a small to moderate right hip joint effusion with slightly hyperattenuating walls, raising the concern for chronic joint effusion versus septic joint. Correlate clinically. 4. Multiple other nonacute observations, as described above. Electronically Signed   By: Ree Molt M.D.   On: 11/20/2023 14:49   DG Chest Port 1 View Result Date: 11/20/2023 CLINICAL DATA:  hypoxia EXAM: PORTABLE CHEST - 1 VIEW COMPARISON:  Sep 28, 2023 FINDINGS: Lower lung volumes. No focal airspace consolidation or pneumothorax. Trace right pleural effusion, unchanged. Mild cardiomegaly.No acute fracture or destructive lesion. Bilateral thoracic Harrington fusion rods hardware again noted. IMPRESSION: Unchanged trace right pleural effusion. Otherwise, no acute cardiopulmonary abnormality. Electronically Signed   By: Rogelia Myers M.D.   On: 11/20/2023 13:17     .Critical Care  Performed by: Towana Ozell BROCKS, MD Authorized by: Towana Ozell BROCKS, MD   Critical care provider statement:    Critical care time (minutes):  45   Critical care time was exclusive of:  Separately billable procedures and treating other patients   Critical care was necessary to treat or prevent imminent or life-threatening deterioration of the following conditions:  Respiratory failure   Critical care was  time spent personally by me on the following activities:  Development of treatment plan with patient or surrogate, discussions with consultants, evaluation of patient's response to treatment, examination of patient, obtaining history from patient or surrogate, ordering and performing treatments and interventions, ordering and review of laboratory studies, ordering and review of radiographic studies, pulse oximetry, re-evaluation of patient's condition and review of old charts   I assumed direction of critical care for this patient from another provider in my specialty: no      Medications Ordered in the ED  iohexol  (OMNIPAQUE ) 350 MG/ML injection 75 mL (75 mLs Intravenous Contrast Given 11/20/23 1412)    Clinical Course as of 11/20/23 1540  Thu Nov 20, 2023  1316 Chest x-ray interpreted by me as cardiomegaly no gross infiltrate.  Awaiting radiology reading. [MB]  1336 Discussed with nephrology Dr. Melia.  He felt that if the patient needs a CT it is okay to give her contrast.  He said it is more  important for people on peritoneal dialysis if they have some renal function to try to preserve it. [MB]  1344 Patient agreeable to getting contrast for CT.  She is asking for something to eat and understands.  Wait till her CT results [MB]  1352 Patient came back as COVID-positive. [MB]  1529 Discussed with [facility] hospitalist Dr. Pearlean who will evaluate patient for admission. [MB]    Clinical Course User Index [MB] Towana Ozell BROCKS, MD                                 Medical Decision Making Amount and/or Complexity of Data Reviewed Labs: ordered. Radiology: ordered.  Risk Prescription drug management. Decision regarding hospitalization.   This patient complains of low oxygen level, worsen sacral wounds; this involves an extensive number of treatment Options and is a complaint that carries with it a high risk of complications and morbidity. The differential includes sepsis, SIRS, hypoxia, PE,  COVID, flu, osteomyelitis  I ordered, reviewed and interpreted labs, which included CBC with elevated white count, hemoglobin lower than baseline, chemistries consistent with CKD, blood culture sent, lactate normal, COVID-positive, BMP elevated but better than priors, troponins mildly elevated and flat I ordered medication oxygen and reviewed PMP when indicated. I ordered imaging studies which included CT angio chest, CT abdomen and pelvis, chest x-ray and I independently    visualized and interpreted imaging which showed no signs of PE, deep pelvic decubitus on the left and possible inflammatory changes around right hip. Additional history obtained from patient's daughter Previous records obtained and reviewed in epic including recent admission and discharge summaries I consulted [facility] Dr. Pearlean and nephrology Dr. Melia discussed lab and imaging findings and discussed disposition.  Cardiac monitoring reviewed, sinus rhythm Social determinants considered, no significant barriers Critical Interventions: Complex workup for hypoxia requiring imaging and discussion with consultants  After the interventions stated above, I reevaluated the patient and found patient still to be requiring oxygen although fairly nontoxic-appearing Admission and further testing considered, she would benefit from mission the hospital for further workup and management.  She is in agreement with plan for admission.      Final diagnoses:  Acute respiratory failure with hypoxia (HCC)  Pressure injury of deep tissue of sacral region    ED Discharge Orders     None          Towana Ozell BROCKS, MD 11/20/23 1733

## 2023-11-20 NOTE — Telephone Encounter (Signed)
 Copied from CRM [id number]. Topic: Clinical - Prescription Issue >> Nov 20, 2023 12:20 PM Willma R wrote: Reason for CRM: [facility] calling in regards to amoxicillin -clavulanate (AUGMENTIN ) 250-125 MG tablet. Needs to see about an alternate and has additional questions in regards to the patient.  Penne can be reached at [phone]

## 2023-11-20 NOTE — Assessment & Plan Note (Signed)
 Considering her recent worsening of dyspnea and hypoxia, concern for CAP - has had recurrent pneumonia in the past, has multiple allergies to antibiotics, sent Augmentin , but she later decided to go to ER

## 2023-11-20 NOTE — ED Triage Notes (Signed)
 Pt arrived via POV following PCP Office for evaluation of a worsening pressure injury to her buttocks and hypoxia. Pt has LUE restriction and receives dialysis M,W,F. Pt endorses left ribcage pain as well.

## 2023-11-20 NOTE — ED Notes (Signed)
 Pt to CT at this time.

## 2023-11-20 NOTE — H&P (Addendum)
 History and Physical    [name] FMW:[id number] DOB: 14-Jan-1966 DOA: 11/20/2023  PCP: [name], [name], FNP   Patient coming from: Home  I have personally briefly reviewed patient's old medical records in [facility]  Chief Complaint: Worsening decub ulcer, Low o2 sats  HPI: [name] is a 58 y.o. female with medical history significant for paraplegia, ESRD, decubitus ulcers, seizure, chronic indwelling Foley. Patient was sent to the ED from outpatient providers office with reports of initial low O2-87%, but this improved to 91% and worsening decubitus ulcer. On my evaluation, patient is awake alert oriented x 4 and able to answer questions.  She reports weakness over the past 3 days, and difficulty catching her breath.  No cough.  No fevers no chills.  Her daughter takes care of her wounds, and reports today she noticed it looked black with some drainage.   ED Course: Temperature 98.5.  Heart rate 76.  Respirate rate 17.  Blood pressure systolic 111-123.  O2 sats 91% on room air. WBC 14.  Magnesium  1.9.  COVID test positive.  Lactic acid 1.  BNP 452.  Troponin 79. Lactic acid 0.9. Portable chest x-ray unchanged trace right pleural effusion no acute abnormality.   Subsequent CTA chest, CT abdomen and pelvis with contrast-no PE, shows decubitus ulcer in posterior aspect of the upper left thigh reaching up to the bone surface there is associated small walled off collection/abscess in the soft tissue measuring up to 2 x 3.3 cm.  Small to moderate right hip joint effusion with slightly hyperattenuating walls raising the concern for chronic joint effusion versus septic joint.  Correlate clinically.  Review of Systems: As per HPI all other systems reviewed and negative.  Past Medical History:  Diagnosis Date   Abnormal uterine bleeding (AUB) 06/15/2014   Cancer (HCC)    uterine   ESRD on hemodialysis (HCC)    High blood pressure    Paraplegia (lower)    Seizure  disorder (HCC)    Seizures (HCC)    Suprapubic catheter (HCC)    Urinary tract infection     Past Surgical History:  Procedure Laterality Date   APPLICATION OF WOUND VAC Right 09/13/2021   (approximately 1-13mos ago) pressure sore on right hip   BACK SURGERY     Pt stated before 2000   BIOPSY  12/03/2022   Procedure: BIOPSY;  Surgeon: [name], MD;  Location: [facility];  Service: Gastroenterology;;   ESOPHAGOGASTRODUODENOSCOPY N/A 09/20/2015   Procedure: ESOPHAGOGASTRODUODENOSCOPY (EGD);  Surgeon: [name], MD;  Location: [facility];  Service: Endoscopy;  Laterality: N/A;  730   ESOPHAGOGASTRODUODENOSCOPY (EGD) WITH PROPOFOL  N/A 12/03/2022   Procedure: ESOPHAGOGASTRODUODENOSCOPY (EGD) WITH PROPOFOL ;  Surgeon: [name], MD;  Location: [facility];  Service: Gastroenterology;  Laterality: N/A;  1:15 pm, asa 3, pt knows to arrive at 10:30  dialysis pt, M,W & F   IR CATHETER TUBE CHANGE  04/02/2018   PERCUTANEOUS ENDOSCOPIC GASTROSTOMY (PEG) REMOVAL N/A 09/20/2015   Procedure: PERCUTANEOUS ENDOSCOPIC GASTROSTOMY (PEG) REMOVAL;  Surgeon: [name], MD;  Location: [facility];  Service: Endoscopy;  Laterality: N/A;   TEE WITHOUT CARDIOVERSION N/A 11/11/2022   Procedure: TRANSESOPHAGEAL ECHOCARDIOGRAM (TEE);  Surgeon: [name], MD;  Location: [facility];  Service: Cardiovascular;  Laterality: N/A;     reports that she has never smoked. She has never used smokeless tobacco. She reports current alcohol use. She reports that she does not use  drugs.  Allergies  Allergen Reactions   Cefepime  Other (See Comments)    Severe AMS 09/2023 admission   Linezolid Other (See Comments)    Patient self-discontinued treatment due to GI intolerance. Taking it along with moxifloxacin   Moxifloxacin Other (See Comments)    Patient self-discontinued treatment due to GI intolerance. Taking it along with linezolid   Quinine Derivatives Other (See Comments)     Alters mental status   Vancomycin  Other (See Comments)    Unknown  Pt is tolerating this medication at HD   Azithromycin  Itching and Rash   Benadryl [Diphenhydramine Hcl (Sleep)] Hives   Daptomycin Hives   Tetracycline Itching    Able to tolerate Doxycycline .    Zosyn  [Piperacillin  Sod-Tazobactam So] Rash    Family History  Problem Relation Age of Onset   Cancer Mother    Hypertension Mother    Cancer Sister        breast and then spread everywhere.   Diabetes Paternal Grandmother    Hypertension Paternal Grandmother     Prior to Admission medications   Medication Sig Start Date End Date Taking? Authorizing Provider  acetaminophen  (TYLENOL ) 500 MG tablet Take 1,000 mg by mouth every 6 (six) hours as needed for moderate pain.    [provider]  albuterol  (PROVENTIL ) (2.5 MG/3ML) 0.083% nebulizer solution Take 3 mLs (2.5 mg total) by nebulization every 4 (four) hours as needed for wheezing or shortness of breath. 04/15/23 04/14/24  Pearlean Manus, MD  amLODipine  (NORVASC ) 10 MG tablet Take 1 tablet (10 mg total) by mouth every evening. Take in the evenings -- Patient taking differently: Take 5 mg by mouth every evening. Take in the evenings -- 09/25/23   Pearlean Manus, MD  amoxicillin -clavulanate (AUGMENTIN ) 250-125 MG tablet Take 1 tablet on non-dialysis days once daily, and take 1 tablet twice daily on dialysis days. 11/20/23   Tobie Suzzane POUR, MD  carvedilol  (COREG ) 12.5 MG tablet Take 1 tablet (12.5 mg total) by mouth 2 (two) times daily with a meal. 08/07/23 11/25/23  [name], [name], FNP  cloNIDine  (CATAPRES  - DOSED IN MG/24 HR) 0.1 mg/24hr patch Place 1 patch (0.1 mg total) onto the skin once a week. 09/18/23   Walker, Caitlin S, NP  collagenase (SANTYL) 250 UNIT/GM ointment Apply 1 Application topically daily. 11/20/23   Tobie Suzzane POUR, MD  cyclobenzaprine  (FLEXERIL ) 5 MG tablet Take 5 mg by mouth 2 (two) times daily as needed for muscle spasms. 04/11/23   [provider]  feeding supplement (ENSURE ENLIVE / ENSURE PLUS) LIQD Take 237 mLs by mouth 2 (two) times daily between meals. 09/30/23 12/29/23  Akula, Vijaya, MD  furosemide  (LASIX ) 80 MG tablet Take 1 tablet (80 mg total) by mouth every Tuesday, Thursday, Saturday, and Sunday. 09/25/23   Pearlean Manus, MD  gabapentin  (NEURONTIN ) 100 MG capsule Take 1 capsule (100 mg total) by mouth 2 (two) times daily. 11/11/23   [name], [name], FNP  irbesartan  (AVAPRO ) 150 MG tablet Take 1 tablet (150 mg total) by mouth daily. Take 150 mg by mouth daily. 08/07/23   [name], [name], FNP  levETIRAcetam  (KEPPRA ) 500 MG tablet Take 2 tablets (1,000 mg total) by mouth every evening. Take an additional tablet on Monday,Wednesday and Friday evening after Dialysis 04/15/23   Pearlean Manus, MD  lidocaine -prilocaine  (EMLA ) cream Apply 1 Application topically 3 (three) times a week. 06/04/22   [provider]  multivitamin (RENA-VIT) TABS tablet Take 1 tablet by mouth daily. 07/29/23   [provider]  pantoprazole  (PROTONIX ) 40 MG tablet Take 1 tablet (40 mg total) by mouth daily. 11/04/23   [name], [name], FNP  senna-docusate (SENOKOT-S) 8.6-50 MG tablet Take 2 tablets by mouth at bedtime. 11/12/22   Pearlean Manus, MD  sevelamer  carbonate (RENVELA ) 800 MG tablet Take 800 mg by mouth 3 (three) times daily. 12/11/22   [provider]    Physical Exam: Vitals:   11/20/23 1226 11/20/23 1228  BP:  111/63  Pulse:  76  Resp:  17  Temp:  98.5 F (36.9 C)  SpO2:  91%  Weight: 38.6 kg   Height: 5' 2 (1.575 m)     Constitutional: NAD, calm, comfortable Vitals:   11/20/23 1226 11/20/23 1228  BP:  111/63  Pulse:  76  Resp:  17  Temp:  98.5 F (36.9 C)  SpO2:  91%  Weight: 38.6 kg   Height: 5' 2 (1.575 m)    Eyes: PERRL, lids and conjunctivae normal ENMT: Mucous membranes are moist.  Neck: normal, supple, no masses, no thyromegaly Respiratory: clear to auscultation bilaterally, no  wheezing, no crackles. Normal respiratory effort. No accessory muscle use.  Cardiovascular: Regular rate and rhythm, no murmurs / rubs / gallops. No extremity edema.   Abdomen: no tenderness, no masses palpated. No hepatosplenomegaly. Bowel sounds positive.  Chronic Foley, cloudy urine in urine bag Musculoskeletal: no clubbing / cyanosis.  Paraplegic with contractures to bilateral knees and hip. Skin: Decubitus ulcer, no signs of surrounding cellulitis no induration, or fluctuance appreciated. Neurologic: No facial asymmetry, speech fluent, good grip strength bilaterally Psychiatric: Normal judgment and insight. Alert and oriented x 3. Normal mood.        Labs on Admission: I have personally reviewed following labs and imaging studies  CBC: Recent Labs  Lab 11/20/23 1250  WBC 14.0*  NEUTROABS 11.9*  HGB 8.2*  HCT 28.7*  MCV 100.3*  PLT 374   Basic Metabolic Panel: Recent Labs  Lab 11/20/23 1250  NA 135  K 3.2*  CL 94*  CO2 27  GLUCOSE 104*  BUN 36*  CREATININE 3.77*  CALCIUM  9.1  MG 1.9   GFR: Estimated Creatinine Clearance: 10.3 mL/min (A) (by C-G formula based on SCr of 3.77 mg/dL (H)). Liver Function Tests: Recent Labs  Lab 11/20/23 1250  AST 20  ALT 17  ALKPHOS 55  BILITOT 0.8  PROT 7.8  ALBUMIN 2.4*   Coagulation Profile: Recent Labs  Lab 11/20/23 1250  INR 1.2   Urine analysis:    Component Value Date/Time   COLORURINE YELLOW 05/14/2023 0037   APPEARANCEUR CLOUDY (A) 05/14/2023 0037   LABSPEC 1.012 05/14/2023 0037   PHURINE 8.0 05/14/2023 0037   GLUCOSEU >=500 (A) 05/14/2023 0037   HGBUR SMALL (A) 05/14/2023 0037   BILIRUBINUR NEGATIVE 05/14/2023 0037   KETONESUR NEGATIVE 05/14/2023 0037   PROTEINUR >=300 (A) 05/14/2023 0037   UROBILINOGEN 0.2 03/31/2015 1200   NITRITE NEGATIVE 05/14/2023 0037   LEUKOCYTESUR LARGE (A) 05/14/2023 0037    Radiological Exams on Admission: CT Angio Chest PE W/Cm &/Or Wo Cm Result Date:  11/20/2023 CLINICAL DATA:  Pulmonary embolism (PE) suspected, high prob; worseing decub wounds. Shortness of breath. EXAM: CT ANGIOGRAPHY CHEST CT ABDOMEN AND PELVIS WITH CONTRAST TECHNIQUE: Multidetector CT imaging of the chest was performed using the standard protocol during bolus administration of intravenous contrast. Multiplanar CT image reconstructions and MIPs were obtained to evaluate the vascular anatomy. Multidetector CT imaging of the abdomen and pelvis was performed using the  standard protocol during bolus administration of intravenous contrast. RADIATION DOSE REDUCTION: This exam was performed according to the departmental dose-optimization program which includes automated exposure control, adjustment of the mA and/or kV according to patient size and/or use of iterative reconstruction technique. CONTRAST:  75mL OMNIPAQUE  IOHEXOL  350 MG/ML SOLN COMPARISON:  CT scan chest from 07/10/2023 and CT Angiography GI bleed from 12/31/2022. FINDINGS: CTA CHEST FINDINGS Cardiovascular: Evaluation of pulmonary embolism beyond the segmental branches is limited due to suboptimal contrast-enhancement. There is no embolism to the segmental pulmonary artery level. There is stable moderately enlarged cardiac size. No pericardial effusion. No aortic aneurysm. There are coronary artery calcifications, in keeping with coronary artery disease. There are also mild peripheral atherosclerotic vascular calcifications of thoracic aorta and its major branches. 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 There multiple thin venous collaterals in the right upper hemithorax, similar to the prior study. Mediastinum/Nodes: Visualized thyroid gland appears grossly unremarkable. No solid / cystic mediastinal masses. The esophagus is nondistended precluding optimal assessment. No axillary, mediastinal or hilar lymphadenopathy by size criteria.  Lungs/Pleura: The central tracheo-bronchial tree is patent. There are segmental atelectatic changes in the bilateral lower lobes, right more than left. There are patchy areas of linear, plate-like atelectasis and/or scarring throughout bilateral lungs. There is mosaic attenuation of lungs, consistent with heterogeneous air trapping related to small airways disease. No mass or consolidation. No pleural effusion or pneumothorax. No suspicious lung nodules. Musculoskeletal: The visualized soft tissues of the chest wall are grossly unremarkable. No suspicious osseous lesions. There are mild to moderate multilevel degenerative changes in the visualized spine. Redemonstration of patient's known posterior spinal fusion as well as anterolisthesis of T6 over T7, unchanged. Review of the MIP images confirms the above findings. CT ABDOMEN and PELVIS FINDINGS Hepatobiliary: The liver is normal in size. Non-cirrhotic configuration. No suspicious mass. No intrahepatic or extrahepatic bile duct dilation. No calcified gallstones. Normal gallbladder wall thickness. No pericholecystic inflammatory changes. Pancreas: Unremarkable. No pancreatic ductal dilatation or surrounding inflammatory changes. Spleen: Within normal limits. No focal lesion. Adrenals/Urinary Tract: Adrenal glands are unremarkable. Redemonstration of small/atrophic bilateral kidneys, right more than left. There is a partially exophytic simple cyst arising from the left kidney upper pole measuring 1.1 x 1.4 cm. No nephroureterolithiasis on either side. Bilateral extrarenal pelves noted. No hydroureteronephrosis on either side. Urinary bladder is decompressed secondary to a suprapubic catheter. Stomach/Bowel: No disproportionate dilation of the small or large bowel loops. No evidence of abnormal bowel wall thickening or inflammatory changes. The appendix was not visualized; however there is no acute inflammatory process in the right lower quadrant. There is moderate  stool burden. Vascular/Lymphatic: No ascites or pneumoperitoneum. No abdominal or pelvic lymphadenopathy, by size criteria. No aneurysmal dilation of the major abdominal arteries. There are mild peripheral atherosclerotic vascular calcifications of the aorta and its major branches. Reproductive: Not well evaluated on this exam. Anteverted uterus noted with endometrium measuring up to 6-7 mm in thickness. No large adnexal mass seen. Correlate clinically to determine the need for additional imaging with pelvic ultrasound. Other: There is soft tissue defect in the posterior aspect of upper left thigh with soft tissue defect reaching up to the proximal left femur bone surface. There is small walled-off abscess/collection in the soft tissue as well measuring at approximately 2.0 x 3.3 cm. There is an additional approximately 1.6 x 7.2 cm soft tissue mass with extensive internal dystrophic calcifications  in the midline upper gluteal region, which is present since the prior study. There is small to moderate right hip joint effusion with slightly hyperattenuating walls, raising the concern for chronic joint effusion versus septic joint. Correlate clinically. Musculoskeletal: No suspicious osseous lesions. There are mild multilevel degenerative changes in the visualized spine. Review of the MIP images confirms the above findings. IMPRESSION: 1. No embolism to the segmental pulmonary artery level. 2. There is a decubitus ulcer in the posterior aspect of the upper left thigh reaching up to the bone surface. There is associated small walled-off abscess/collection in the soft tissue measuring up to 2.0 x 3.3 cm. 3. There is a small to moderate right hip joint effusion with slightly hyperattenuating walls, raising the concern for chronic joint effusion versus septic joint. Correlate clinically. 4. Multiple other nonacute observations, as described above. Electronically Signed   By: Ree Molt M.D.   On: 11/20/2023 14:49    CT ABDOMEN PELVIS W CONTRAST Result Date: 11/20/2023 CLINICAL DATA:  Pulmonary embolism (PE) suspected, high prob; worseing decub wounds. Shortness of breath. EXAM: CT ANGIOGRAPHY CHEST CT ABDOMEN AND PELVIS WITH CONTRAST TECHNIQUE: Multidetector CT imaging of the chest was performed using the standard protocol during bolus administration of intravenous contrast. Multiplanar CT image reconstructions and MIPs were obtained to evaluate the vascular anatomy. Multidetector CT imaging of the abdomen and pelvis was performed using the standard protocol during bolus administration of intravenous contrast. RADIATION DOSE REDUCTION: This exam was performed according to the departmental dose-optimization program which includes automated exposure control, adjustment of the mA and/or kV according to patient size and/or use of iterative reconstruction technique. CONTRAST:  75mL OMNIPAQUE  IOHEXOL  350 MG/ML SOLN COMPARISON:  CT scan chest from 07/10/2023 and CT Angiography GI bleed from 12/31/2022. FINDINGS: CTA CHEST FINDINGS Cardiovascular: Evaluation of pulmonary embolism beyond the segmental branches is limited due to suboptimal contrast-enhancement. There is no embolism to the segmental pulmonary artery level. There is stable moderately enlarged cardiac size. No pericardial effusion. No aortic aneurysm. There are coronary artery calcifications, in keeping with coronary artery disease. There are also mild peripheral atherosclerotic vascular calcifications of thoracic aorta and its major branches. 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 There multiple thin venous collaterals in the right upper hemithorax, similar to the prior study. Mediastinum/Nodes: Visualized thyroid gland appears grossly unremarkable. No solid / cystic mediastinal masses. The esophagus is nondistended precluding optimal assessment. No axillary,  mediastinal or hilar lymphadenopathy by size criteria. Lungs/Pleura: The central tracheo-bronchial tree is patent. There are segmental atelectatic changes in the bilateral lower lobes, right more than left. There are patchy areas of linear, plate-like atelectasis and/or scarring throughout bilateral lungs. There is mosaic attenuation of lungs, consistent with heterogeneous air trapping related to small airways disease. No mass or consolidation. No pleural effusion or pneumothorax. No suspicious lung nodules. Musculoskeletal: The visualized soft tissues of the chest wall are grossly unremarkable. No suspicious osseous lesions. There are mild to moderate multilevel degenerative changes in the visualized spine. Redemonstration of patient's known posterior spinal fusion as well as anterolisthesis of T6 over T7, unchanged. Review of the MIP images confirms the above findings. CT ABDOMEN and PELVIS FINDINGS Hepatobiliary: The liver is normal in size. Non-cirrhotic configuration. No suspicious mass. No intrahepatic or extrahepatic bile duct dilation. No calcified gallstones. Normal gallbladder wall thickness. No pericholecystic inflammatory changes. Pancreas: Unremarkable. No pancreatic ductal dilatation or surrounding inflammatory changes. Spleen: Within  normal limits. No focal lesion. Adrenals/Urinary Tract: Adrenal glands are unremarkable. Redemonstration of small/atrophic bilateral kidneys, right more than left. There is a partially exophytic simple cyst arising from the left kidney upper pole measuring 1.1 x 1.4 cm. No nephroureterolithiasis on either side. Bilateral extrarenal pelves noted. No hydroureteronephrosis on either side. Urinary bladder is decompressed secondary to a suprapubic catheter. Stomach/Bowel: No disproportionate dilation of the small or large bowel loops. No evidence of abnormal bowel wall thickening or inflammatory changes. The appendix was not visualized; however there is no acute inflammatory  process in the right lower quadrant. There is moderate stool burden. Vascular/Lymphatic: No ascites or pneumoperitoneum. No abdominal or pelvic lymphadenopathy, by size criteria. No aneurysmal dilation of the major abdominal arteries. There are mild peripheral atherosclerotic vascular calcifications of the aorta and its major branches. Reproductive: Not well evaluated on this exam. Anteverted uterus noted with endometrium measuring up to 6-7 mm in thickness. No large adnexal mass seen. Correlate clinically to determine the need for additional imaging with pelvic ultrasound. Other: There is soft tissue defect in the posterior aspect of upper left thigh with soft tissue defect reaching up to the proximal left femur bone surface. There is small walled-off abscess/collection in the soft tissue as well measuring at approximately 2.0 x 3.3 cm. There is an additional approximately 1.6 x 7.2 cm soft tissue mass with extensive internal dystrophic calcifications in the midline upper gluteal region, which is present since the prior study. There is small to moderate right hip joint effusion with slightly hyperattenuating walls, raising the concern for chronic joint effusion versus septic joint. Correlate clinically. Musculoskeletal: No suspicious osseous lesions. There are mild multilevel degenerative changes in the visualized spine. Review of the MIP images confirms the above findings. IMPRESSION: 1. No embolism to the segmental pulmonary artery level. 2. There is a decubitus ulcer in the posterior aspect of the upper left thigh reaching up to the bone surface. There is associated small walled-off abscess/collection in the soft tissue measuring up to 2.0 x 3.3 cm. 3. There is a small to moderate right hip joint effusion with slightly hyperattenuating walls, raising the concern for chronic joint effusion versus septic joint. Correlate clinically. 4. Multiple other nonacute observations, as described above. Electronically Signed    By: Ree Molt M.D.   On: 11/20/2023 14:49   DG Chest Port 1 View Result Date: 11/20/2023 CLINICAL DATA:  hypoxia EXAM: PORTABLE CHEST - 1 VIEW COMPARISON:  Sep 28, 2023 FINDINGS: Lower lung volumes. No focal airspace consolidation or pneumothorax. Trace right pleural effusion, unchanged. Mild cardiomegaly.No acute fracture or destructive lesion. Bilateral thoracic Harrington fusion rods hardware again noted. IMPRESSION: Unchanged trace right pleural effusion. Otherwise, no acute cardiopulmonary abnormality. Electronically Signed   By: Rogelia Myers M.D.   On: 11/20/2023 13:17   EKG: Independently reviewed.  Sinus rhythm, rate 77, QTc 470.  LAFB, IRBBB. No significant change from prior.  Assessment/Plan Principal Problem:   Decubitus ulcer of buttock Active Problems:   COVID-19 virus infection   Paraplegia (HCC)   ESRD on hemodialysis (HCC)   Right hip joint effusion   Seizure disorder (HCC)   Essential hypertension   H/O urinary retention  Assessment and Plan: * Decubitus ulcer of buttock Chronic decubitus ulcer.  Daughter noticed black discoloration today.  On exam no sign of cellulitis.  Most of the wounds have significantly improved/healed considering prior exam.  WBC of 14.  Rules out for sepsis.  Normal lactic acid 1.  CT abdomen and  pelvis with contrast today- There is a decubitus ulcer in the posterior aspect of the upper left thigh reaching up to the bone surface. There is associated small walled-off abscess/collection in the soft tissue measuring up to 2.0 x 3.3 cm. - IV Vanco and ceftriaxone  - Gen surgery consulted will see in consult in a.m, asked me to reach out to Ortho. - I reached out to Dr. Margrette- as no bone involvement, hence nothing for ortho to offer  COVID-19 virus infection O2 sats 91% on room air, placed on 2 L.  She reports some dyspnea and weakness over the past 3 days.  CTA chest negative for PE, or other lung pathology. - Per pharmacy,paxlovid not  stocked at [facility], may consider on discharge if still indicated. - Supplemental O2 - Nebs as needed - No steroids as chest is clear on imaging   Right hip joint effusion Appears to be more of a chronic issue.   - CT today - small to moderate right hip joint effusion with slightly hyperattenuating walls, raising the concern for chronic joint effusion versus septic joint. Correlate clinically. -Per Care Everywhere, last CT angio pelvis 11/2022 shows sterility indeterminate right hip joint effusion.   - Patient can likely follow-up as outpatient for effusion if needed.  ESRD on hemodialysis [facility]) Schedule Monday Wednesday Friday.  Reports compliance with HD sessions. -Nephrology aware patient is being admitted  H/O urinary retention Chronic indwelling Foley catheter.  Urine bag draining very cloudy urine.  Little amount of urine.  WBC 14. -Follow-up urine cultures ordered in ED.  Essential hypertension Stable. -Resume Norvasc , carvedilol , irbesartan    DVT prophylaxis: SCDS- pending gen surg eval Code Status: FULL Code Family Communication: None at bedside, Daughter on the phone Disposition Plan: ~ 2 days Consults called: Gen Surg, Nephrology Admission status: Inpt Tele I certify that at the point of admission it is my clinical judgment that the patient will require inpatient hospital care spanning beyond 2 midnights from the point of admission due to high intensity of service, high risk for further deterioration and high frequency of surveillance required.   Author: Tully FORBES Carwin, MD 11/20/2023 7:47 PM  For on call review [email or web address].

## 2023-11-20 NOTE — Assessment & Plan Note (Signed)
 Has chronic urinary retention, has urinary catheter in place Referred to home health for catheter care as well Referred to urology

## 2023-11-20 NOTE — Assessment & Plan Note (Signed)
 Stable. -Resume Norvasc , carvedilol , irbesartan 

## 2023-11-20 NOTE — Assessment & Plan Note (Signed)
 Has recurrent pressure ulcers in the rectal area Needs home health for routine wound care Followed by [facility]

## 2023-11-20 NOTE — Assessment & Plan Note (Signed)
 Referred to home health for catheter care Referred to urology

## 2023-11-20 NOTE — Assessment & Plan Note (Signed)
 Chronic indwelling Foley catheter.  Urine bag draining very cloudy urine.  Little amount of urine.  WBC 14. -Follow-up urine cultures ordered in ED.

## 2023-11-20 NOTE — Progress Notes (Signed)
 Established Patient Office Visit  Subjective:  Patient ID: [name], female    DOB: 1965-10-04  Age: 58 y.o. MRN: [id number]  CC:  Chief Complaint  Patient presents with   Referral    Needs a referral to Urology for catheter monitoring. Also needs home health nurse orders. Needs orders for wound care.      HPI [name] is a 58 y.o. female with past medical history of HTN, paraplegia, recurrent pneumonia, chronic urinary retention, ESRD on HD and recurrent pressure ulcers who presents for evaluation of pressure ulcers and urinary catheter.  She appears lethargic and complains of dyspnea for the last few days.  Her oxygen saturation was 87% on initial evaluation, which improved to 91% later on room air.  She also reports chills.  She had Legionella pneumonia and later HCAP in the last 6 months.  She has multiple allergies to antibiotics.  She has recurrent pressure ulcers in the sacral area and rectal area.  She has been followed by [facility] wound [facility], but has lost home health currently.  Her daughter helps with the wound care, but reports that the wound has been worsening lately.  She has a urinary catheter due to chronic urinary retention from paraplegia.  She does not have a local urologist currently, used to follow up with [facility] at [facility].  Her daughter has tried to do catheter care currently.  Past Medical History:  Diagnosis Date   Abnormal uterine bleeding (AUB) 06/15/2014   Cancer (HCC)    uterine   ESRD on hemodialysis (HCC)    High blood pressure    Paraplegia (lower)    Seizure disorder (HCC)    Seizures (HCC)    Suprapubic catheter (HCC)    Urinary tract infection     Past Surgical History:  Procedure Laterality Date   APPLICATION OF WOUND VAC Right 09/13/2021   (approximately 1-53mos ago) pressure sore on right hip   BACK SURGERY     Pt stated before 2000   BIOPSY  12/03/2022   Procedure: BIOPSY;  Surgeon:  [name], MD;  Location: [facility];  Service: Gastroenterology;;   ESOPHAGOGASTRODUODENOSCOPY N/A 09/20/2015   Procedure: ESOPHAGOGASTRODUODENOSCOPY (EGD);  Surgeon: [name], MD;  Location: [facility];  Service: Endoscopy;  Laterality: N/A;  730   ESOPHAGOGASTRODUODENOSCOPY (EGD) WITH PROPOFOL  N/A 12/03/2022   Procedure: ESOPHAGOGASTRODUODENOSCOPY (EGD) WITH PROPOFOL ;  Surgeon: [name], MD;  Location: [facility];  Service: Gastroenterology;  Laterality: N/A;  1:15 pm, asa 3, pt knows to arrive at 10:30  dialysis pt, M,W & F   IR CATHETER TUBE CHANGE  04/02/2018   PERCUTANEOUS ENDOSCOPIC GASTROSTOMY (PEG) REMOVAL N/A 09/20/2015   Procedure: PERCUTANEOUS ENDOSCOPIC GASTROSTOMY (PEG) REMOVAL;  Surgeon: [name], MD;  Location: [facility];  Service: Endoscopy;  Laterality: N/A;   TEE WITHOUT CARDIOVERSION N/A 11/11/2022   Procedure: TRANSESOPHAGEAL ECHOCARDIOGRAM (TEE);  Surgeon: [name], MD;  Location: [facility];  Service: Cardiovascular;  Laterality: N/A;    Family History  Problem Relation Age of Onset   Cancer Mother    Hypertension Mother    Cancer Sister        breast and then spread everywhere.   Diabetes Paternal Grandmother    Hypertension Paternal Grandmother     Social History   Socioeconomic History   Marital status: Single    Spouse name: Not on file   Number of children: 3  Years of education: 63 th   Highest education level: Not on file  Occupational History    Comment: Disabled  Tobacco Use   Smoking status: Never   Smokeless tobacco: Never  Vaping Use   Vaping status: Never Used  Substance and Sexual Activity   Alcohol use: Yes    Comment: Occassionally   Drug use: No   Sexual activity: Never  Other Topics Concern   Not on file  Social History Narrative   Patient lives with her son Selena). Patient is disabled.   Education 9th grade.   Right handed.   Caffeine - None    Social Drivers of  Corporate investment banker Strain: Not on file  Food Insecurity: No Food Insecurity (09/26/2023)   Hunger Vital Sign    Worried About Running Out of Food in the Last Year: Never true    Ran Out of Food in the Last Year: Never true  Transportation Needs: No Transportation Needs (09/26/2023)   PRAPARE - Administrator, Civil Service (Medical): No    Lack of Transportation (Non-Medical): No  Physical Activity: Not on file  Stress: Not on file  Social Connections: Not on file  Intimate Partner Violence: Not At Risk (09/26/2023)   Humiliation, Afraid, Rape, and Kick questionnaire    Fear of Current or Ex-Partner: No    Emotionally Abused: No    Physically Abused: No    Sexually Abused: No    Outpatient Medications Prior to Visit  Medication Sig Dispense Refill   acetaminophen  (TYLENOL ) 500 MG tablet Take 1,000 mg by mouth every 6 (six) hours as needed for moderate pain.     albuterol  (PROVENTIL ) (2.5 MG/3ML) 0.083% nebulizer solution Take 3 mLs (2.5 mg total) by nebulization every 4 (four) hours as needed for wheezing or shortness of breath. 75 mL 2   amLODipine  (NORVASC ) 10 MG tablet Take 1 tablet (10 mg total) by mouth every evening. Take in the evenings -- (Patient taking differently: Take 5 mg by mouth every evening. Take in the evenings --) 30 tablet 3   carvedilol  (COREG ) 12.5 MG tablet Take 1 tablet (12.5 mg total) by mouth 2 (two) times daily with a meal. 180 tablet 3   cloNIDine  (CATAPRES  - DOSED IN MG/24 HR) 0.1 mg/24hr patch Place 1 patch (0.1 mg total) onto the skin once a week. 4 patch 12   cyclobenzaprine  (FLEXERIL ) 5 MG tablet Take 5 mg by mouth 2 (two) times daily as needed for muscle spasms.     feeding supplement (ENSURE ENLIVE / ENSURE PLUS) LIQD Take 237 mLs by mouth 2 (two) times daily between meals. 14220 mL 2   furosemide  (LASIX ) 80 MG tablet Take 1 tablet (80 mg total) by mouth every Tuesday, Thursday, Saturday, and Sunday. 45 tablet 3   gabapentin  (NEURONTIN )  100 MG capsule Take 1 capsule (100 mg total) by mouth 2 (two) times daily. 60 capsule 0   irbesartan  (AVAPRO ) 150 MG tablet Take 1 tablet (150 mg total) by mouth daily. Take 150 mg by mouth daily. 90 tablet 1   levETIRAcetam  (KEPPRA ) 500 MG tablet Take 2 tablets (1,000 mg total) by mouth every evening. Take an additional tablet on Monday,Wednesday and Friday evening after Dialysis 90 tablet 3   lidocaine -prilocaine  (EMLA ) cream Apply 1 Application topically 3 (three) times a week.     multivitamin (RENA-VIT) TABS tablet Take 1 tablet by mouth daily.     pantoprazole  (PROTONIX ) 40 MG tablet Take 1 tablet (  40 mg total) by mouth daily. 30 tablet 0   senna-docusate (SENOKOT-S) 8.6-50 MG tablet Take 2 tablets by mouth at bedtime. 60 tablet 3   sevelamer  carbonate (RENVELA ) 800 MG tablet Take 800 mg by mouth 3 (three) times daily.     No facility-administered medications prior to visit.    Allergies  Allergen Reactions   Benadryl [Diphenhydramine Hcl (Sleep)] Hives   Cefepime  Other (See Comments)    Severe AMS 09/2023 admission   Daptomycin Hives   Linezolid Other (See Comments)    Patient self-discontinued treatment due to GI intolerance. Taking it along with moxifloxacin   Moxifloxacin Other (See Comments)    Patient self-discontinued treatment due to GI intolerance. Taking it along with linezolid   Quinine Derivatives Other (See Comments)    Alters mental status   Vancomycin  Other (See Comments)    Pt is tolerating this medication at HD   Azithromycin  Itching and Rash   Tetracycline Itching    Able to tolerate Doxycycline .    Zosyn  [Piperacillin  Sod-Tazobactam So] Rash    ROS Review of Systems  Constitutional:  Positive for chills and fatigue. Negative for fever.  HENT:  Negative for sore throat.   Respiratory:  Positive for shortness of breath. Negative for cough.   Cardiovascular:  Negative for chest pain and palpitations.  Gastrointestinal:  Negative for nausea and vomiting.   Genitourinary:  Positive for difficulty urinating. Negative for dysuria.  Musculoskeletal:  Negative for neck pain and neck stiffness.  Skin:  Negative for rash.  Neurological:  Positive for weakness. Negative for dizziness.  Psychiatric/Behavioral:  Negative for agitation and behavioral problems.       Objective:    Physical Exam Constitutional:      Appearance: She is cachectic. She is not diaphoretic.  HENT:     Head: Normocephalic and atraumatic.     Nose: Nose normal. No rhinorrhea.     Mouth/Throat:     Mouth: Mucous membranes are dry.     Pharynx: No posterior oropharyngeal erythema.   Eyes:     General: No scleral icterus.    Extraocular Movements: Extraocular movements intact.    Cardiovascular:     Rate and Rhythm: Normal rate and regular rhythm.     Heart sounds: Normal heart sounds. No murmur heard. Pulmonary:     Breath sounds: No wheezing or rales.   Skin:    Comments: Pressure ulcer in sacral and rectal area   Neurological:     Mental Status: She is alert and oriented to person, place, and time.     Sensory: Sensory deficit (Bilateral LE) present.     Motor: Weakness (Bilateral LE - 0/5) present.   Psychiatric:        Mood and Affect: Mood normal.        Behavior: Behavior normal.     BP 105/64   Pulse 82   Ht 5' 2 (1.575 m)   Wt 95 lb (43.1 kg) Comment: pt reported  LMP  (LMP Unknown)   SpO2 (!) 87%   BMI 17.38 kg/m  Wt Readings from Last 3 Encounters:  11/20/23 95 lb (43.1 kg)  09/29/23 101 lb 6.6 oz (46 kg)  09/24/23 96 lb 9 oz (43.8 kg)    Lab Results  Component Value Date   TSH 2.393 09/26/2023   Lab Results  Component Value Date   WBC 5.7 09/29/2023   HGB 11.7 (L) 09/29/2023   HCT 37.7 09/29/2023   MCV 92.6 09/29/2023  PLT 193 09/29/2023   Lab Results  Component Value Date   NA 134 (L) 09/29/2023   K 4.3 09/29/2023   CO2 23 09/29/2023   GLUCOSE 99 09/29/2023   BUN 57 (H) 09/29/2023   CREATININE 6.40 (H) 09/29/2023    BILITOT 0.7 09/26/2023   ALKPHOS 81 09/26/2023   AST 18 09/26/2023   ALT 15 09/26/2023   PROT 7.8 09/26/2023   ALBUMIN 3.1 (L) 09/29/2023   CALCIUM  9.5 09/29/2023   ANIONGAP 19 (H) 09/29/2023   Lab Results  Component Value Date   CHOL 384 (H) 05/02/2014   Lab Results  Component Value Date   HDL 42 05/02/2014   Lab Results  Component Value Date   LDLCALC UNABLE TO CALCULATE IF TRIGLYCERIDE OVER 400 mg/dL 87/92/7984   Lab Results  Component Value Date   TRIG 412 (H) 05/02/2014   Lab Results  Component Value Date   CHOLHDL 9.1 05/02/2014   No results found for: HGBA1C    Assessment & Plan:   Problem List Items Addressed This Visit       Respiratory   Acute hypoxemic respiratory failure (HCC)   Had oxygen saturation at 87% on initial evaluation Also reports chills and dyspnea She initially referred to outpatient care, but later agreed to go to ER for further evaluation      Relevant Orders   For home use only DME oxygen   Community acquired pneumonia   Considering her recent worsening of dyspnea and hypoxia, concern for CAP - has had recurrent pneumonia in the past, has multiple allergies to antibiotics, sent Augmentin , but she later decided to go to ER      Relevant Medications   amoxicillin -clavulanate (AUGMENTIN ) 250-125 MG tablet     Nervous and Auditory   Paraplegia (HCC) (Chronic)   Has chronic urinary retention, has urinary catheter in place Referred to home health for catheter care as well Referred to urology        Other   Decubitus ulcers - Primary   Has recurrent pressure ulcers in the rectal area Needs home health for routine wound care Followed by [facility]      Relevant Medications   collagenase (SANTYL) 250 UNIT/GM ointment   Other Relevant Orders   Ambulatory referral to Home Health   H/O urinary retention   Referred to home health for catheter care Referred to urology      Relevant Orders   Ambulatory  referral to Urology   Other Visit Diagnoses       Urinary catheter in place       Relevant Orders   Ambulatory referral to Urology       Meds ordered this encounter  Medications   collagenase (SANTYL) 250 UNIT/GM ointment    Sig: Apply 1 Application topically daily.    Dispense:  30 g    Refill:  0   amoxicillin -clavulanate (AUGMENTIN ) 250-125 MG tablet    Sig: Take 1 tablet on non-dialysis days once daily, and take 1 tablet twice daily on dialysis days.    Dispense:  7 tablet    Refill:  0    Follow-up: Return if symptoms worsen or fail to improve.    Suzzane MARLA Blanch, MD

## 2023-11-20 NOTE — Assessment & Plan Note (Signed)
 Appears to be more of a chronic issue.   - CT today - small to moderate right hip joint effusion with slightly hyperattenuating walls, raising the concern for chronic joint effusion versus septic joint. Correlate clinically. -Per Care Everywhere, last CT angio pelvis 11/2022 shows sterility indeterminate right hip joint effusion.   - Patient can likely follow-up as outpatient for effusion if needed.

## 2023-11-20 NOTE — Patient Instructions (Signed)
 Please start taking Augmentin  one tablet once daily on non-HD days and one tablet twice daily on HD days.  Please apply Santyl for wound cleaning. Please contact wound clinic for sooner evaluation.  Please use oxygen as needed for shortness of breath.

## 2023-11-21 ENCOUNTER — Encounter: Payer: Self-pay | Admitting: Orthopedic Surgery

## 2023-11-21 ENCOUNTER — Other Ambulatory Visit: Payer: Self-pay | Admitting: Family Medicine

## 2023-11-21 DIAGNOSIS — I5032 Chronic diastolic (congestive) heart failure: Secondary | ICD-10-CM | POA: Diagnosis present

## 2023-11-21 DIAGNOSIS — L89156 Pressure-induced deep tissue damage of sacral region: Secondary | ICD-10-CM

## 2023-11-21 DIAGNOSIS — Z9359 Other cystostomy status: Secondary | ICD-10-CM | POA: Diagnosis not present

## 2023-11-21 DIAGNOSIS — L89329 Pressure ulcer of left buttock, unspecified stage: Secondary | ICD-10-CM | POA: Diagnosis present

## 2023-11-21 DIAGNOSIS — Z833 Family history of diabetes mellitus: Secondary | ICD-10-CM | POA: Diagnosis not present

## 2023-11-21 DIAGNOSIS — L89324 Pressure ulcer of left buttock, stage 4: Secondary | ICD-10-CM | POA: Diagnosis not present

## 2023-11-21 DIAGNOSIS — T148XXS Other injury of unspecified body region, sequela: Secondary | ICD-10-CM | POA: Diagnosis not present

## 2023-11-21 DIAGNOSIS — J9601 Acute respiratory failure with hypoxia: Secondary | ICD-10-CM | POA: Diagnosis present

## 2023-11-21 DIAGNOSIS — J449 Chronic obstructive pulmonary disease, unspecified: Secondary | ICD-10-CM | POA: Diagnosis present

## 2023-11-21 DIAGNOSIS — U071 COVID-19: Secondary | ICD-10-CM | POA: Diagnosis present

## 2023-11-21 DIAGNOSIS — L89899 Pressure ulcer of other site, unspecified stage: Secondary | ICD-10-CM | POA: Diagnosis present

## 2023-11-21 DIAGNOSIS — L8990 Pressure ulcer of unspecified site, unspecified stage: Secondary | ICD-10-CM | POA: Diagnosis present

## 2023-11-21 DIAGNOSIS — Z992 Dependence on renal dialysis: Secondary | ICD-10-CM | POA: Diagnosis not present

## 2023-11-21 DIAGNOSIS — G8220 Paraplegia, unspecified: Secondary | ICD-10-CM | POA: Diagnosis present

## 2023-11-21 DIAGNOSIS — L02416 Cutaneous abscess of left lower limb: Secondary | ICD-10-CM | POA: Diagnosis present

## 2023-11-21 DIAGNOSIS — D631 Anemia in chronic kidney disease: Secondary | ICD-10-CM | POA: Diagnosis present

## 2023-11-21 DIAGNOSIS — E875 Hyperkalemia: Secondary | ICD-10-CM | POA: Diagnosis not present

## 2023-11-21 DIAGNOSIS — G40909 Epilepsy, unspecified, not intractable, without status epilepticus: Secondary | ICD-10-CM | POA: Diagnosis present

## 2023-11-21 DIAGNOSIS — L89229 Pressure ulcer of left hip, unspecified stage: Secondary | ICD-10-CM | POA: Diagnosis present

## 2023-11-21 DIAGNOSIS — I10 Essential (primary) hypertension: Secondary | ICD-10-CM | POA: Diagnosis not present

## 2023-11-21 DIAGNOSIS — Z79899 Other long term (current) drug therapy: Secondary | ICD-10-CM | POA: Diagnosis not present

## 2023-11-21 DIAGNOSIS — E871 Hypo-osmolality and hyponatremia: Secondary | ICD-10-CM | POA: Diagnosis not present

## 2023-11-21 DIAGNOSIS — Z8249 Family history of ischemic heart disease and other diseases of the circulatory system: Secondary | ICD-10-CM | POA: Diagnosis not present

## 2023-11-21 DIAGNOSIS — Z881 Allergy status to other antibiotic agents status: Secondary | ICD-10-CM | POA: Diagnosis not present

## 2023-11-21 DIAGNOSIS — L89224 Pressure ulcer of left hip, stage 4: Secondary | ICD-10-CM

## 2023-11-21 DIAGNOSIS — L89300 Pressure ulcer of unspecified buttock, unstageable: Secondary | ICD-10-CM | POA: Diagnosis not present

## 2023-11-21 DIAGNOSIS — N2581 Secondary hyperparathyroidism of renal origin: Secondary | ICD-10-CM | POA: Diagnosis present

## 2023-11-21 DIAGNOSIS — L02419 Cutaneous abscess of limb, unspecified: Secondary | ICD-10-CM | POA: Diagnosis present

## 2023-11-21 DIAGNOSIS — N186 End stage renal disease: Secondary | ICD-10-CM | POA: Diagnosis present

## 2023-11-21 DIAGNOSIS — L89320 Pressure ulcer of left buttock, unstageable: Secondary | ICD-10-CM

## 2023-11-21 DIAGNOSIS — I132 Hypertensive heart and chronic kidney disease with heart failure and with stage 5 chronic kidney disease, or end stage renal disease: Secondary | ICD-10-CM | POA: Diagnosis present

## 2023-11-21 LAB — CBC
HCT: 23.4 % — ABNORMAL LOW (ref 36.0–46.0)
Hemoglobin: 7 g/dL — ABNORMAL LOW (ref 12.0–15.0)
MCH: 29.5 pg (ref 26.0–34.0)
MCHC: 29.9 g/dL — ABNORMAL LOW (ref 30.0–36.0)
MCV: 98.7 fL (ref 80.0–100.0)
Platelets: 302 10*3/uL (ref 150–400)
RBC: 2.37 MIL/uL — ABNORMAL LOW (ref 3.87–5.11)
RDW: 17.6 % — ABNORMAL HIGH (ref 11.5–15.5)
WBC: 11.2 10*3/uL — ABNORMAL HIGH (ref 4.0–10.5)
nRBC: 0 % (ref 0.0–0.2)

## 2023-11-21 LAB — BASIC METABOLIC PANEL WITH GFR
Anion gap: 12 (ref 5–15)
BUN: 52 mg/dL — ABNORMAL HIGH (ref 6–20)
CO2: 23 mmol/L (ref 22–32)
Calcium: 8.3 mg/dL — ABNORMAL LOW (ref 8.9–10.3)
Chloride: 96 mmol/L — ABNORMAL LOW (ref 98–111)
Creatinine, Ser: 4.85 mg/dL — ABNORMAL HIGH (ref 0.44–1.00)
GFR, Estimated: 10 mL/min — ABNORMAL LOW (ref >60)
Glucose, Bld: 138 mg/dL — ABNORMAL HIGH (ref 70–99)
Potassium: 4.3 mmol/L (ref 3.5–5.1)
Sodium: 131 mmol/L — ABNORMAL LOW (ref 135–145)

## 2023-11-21 LAB — MRSA NEXT GEN BY PCR, NASAL: MRSA by PCR Next Gen: NOT DETECTED

## 2023-11-21 LAB — HEPATITIS B SURFACE ANTIGEN: Hepatitis B Surface Ag: NONREACTIVE

## 2023-11-21 MED ORDER — CHLORHEXIDINE GLUCONATE CLOTH 2 % EX PADS
6.0000 | MEDICATED_PAD | Freq: Every day | CUTANEOUS | Status: DC
  Administered 2023-11-21 – 2023-11-23 (×3): 6 via TOPICAL

## 2023-11-21 MED ORDER — CALCITRIOL 0.25 MCG PO CAPS
1.5000 ug | ORAL_CAPSULE | ORAL | Status: DC
  Administered 2023-11-21 – 2023-11-24 (×2): 1.5 ug via ORAL
  Filled 2023-11-21 (×2): qty 6

## 2023-11-21 MED ORDER — CHLORHEXIDINE GLUCONATE CLOTH 2 % EX PADS
6.0000 | MEDICATED_PAD | Freq: Every day | CUTANEOUS | Status: DC
  Administered 2023-11-22: 6 via TOPICAL

## 2023-11-21 MED ORDER — CINACALCET HCL 30 MG PO TABS
30.0000 mg | ORAL_TABLET | ORAL | Status: DC
  Administered 2023-11-21 – 2023-11-24 (×2): 30 mg via ORAL
  Filled 2023-11-21 (×2): qty 1

## 2023-11-21 NOTE — Consult Note (Signed)
 Reason for Consult: ESRD Referring Physician: Dr. Ozell Arts  Chief Complaint: Hypoxia  OP HD: [facility] M[facility]   3h  400/500   44kg   3K bath  Heparin  none  LUA AVF 2/2 , no heparin  Noncompliant history but better recently Calcitriol  1.5 mcg each tx; Sensipar  30mg  TIW Mircera 90 mcg every 2 weeks - due on 6/30 Venofer 50mg  qweekly (already received this week)  Assessment/Plan: Debility/ LE paraplegia with sacral/ ischial pressure wounds treatment with IV vancomycin  and ceftriaxone .  ESRD: on HD MWF. HD today Will try for 1 to 2 L as tolerated, BP okay right now  Additional recommendations - Dose all meds for creatinine clearance < 10 ml/min  - Unless absolutely necessary, no MRIs with gadolinium.  - Prefer needle sticks in the dorsum of the hands or wrists.  No blood pressure measurements in right arm. - If blood transfusion is requested during hemodialysis sessions, please alert us  prior to the session.   Avoid nephrotoxic medications including NSAIDs and iodinated intravenous contrast exposure unless the latter is absolutely indicated.  Preferred narcotic agents for pain control are hydromorphone , fentanyl , and methadone. Morphine  should not be used. Avoid Baclofen  and avoid oral sodium phosphate and magnesium  citrate based laxatives / bowel preps. Continue strict Input and Output monitoring. Will monitor the patient closely with you and intervene or adjust therapy as indicated by changes in clinical status/labs   HTN: Resume antihypertensive regimen if blood pressure is not below; hold antihypertensive therapy on dialysis days before treatment especially given that the patient was hypotensive at the beginning of her prior treatment on Wednesday.  Anemia of esrd: Hemoglobin 7, transfuse as needed.  May have a poor response to ESA because of inflammation Secondary hyperparathyroidism: Calcium  corrected is in range. Cont binders w/ meals and will check a phos.  COVID19+  but no symptoms outside of feeling weak, short of breath; interestingly no cough, fevers or rigors or myalgias.   HPI: [name] is an 58 y.o. female  with a history of HTN, lower extremity paraplegia, neurogenic bladder with a suprapubic catheter (last changed recently), ESRD being dialyzed at [facility] recently admitted in May 2025 with PNA, volume overload, AMS (diffuse encephalopathy seen on neurology). Patient presenting from her PMD's office for evaluation of worsening pelvic wounds and hypoxia. She did receive a full treatment of dialysis on Wednesday but was hypotensive even at the beginning of treatment although it did improve towards the end of treatment.  Patient is seen at wound clinic every few weeks.  Patient saturations in the Emergency Department was 91%, oriented, awake and answering questions appropriately.  She has also had weakness for the past 3 days with associated shortness of breath but denies any cough, fever, chills, myalgias.  In the ED she was COVID positive with a white count of 14  Checks x-ray showed a right pleural effusion and CTA showed no PE there was a decubitus ulcer in the posterior aspect of the upper left thigh region to the pulm surface as well as a collection/abscess in the soft tissue   ROS Pertinent items are noted in HPI.  Chemistry and CBC: Creat  Date/Time Value Ref Range Status  04/18/2015 02:24 PM 0.76 0.50 - 1.10 mg/dL Final   Creatinine, Ser  Date/Time Value Ref Range Status  11/21/2023 05:19 AM 4.85 (H) 0.44 - 1.00 mg/dL Final  93/73/7974 87:49 PM 3.77 (H) 0.44 - 1.00 mg/dL Final  94/94/7974 91:95 AM 6.40 (H) 0.44 -  1.00 mg/dL Final  94/96/7974 92:96 AM 6.73 (H) 0.44 - 1.00 mg/dL Final  94/97/7974 90:78 AM 5.39 (H) 0.44 - 1.00 mg/dL Final    Comment:    DELTA CHECK NOTED  09/24/2023 03:50 AM 3.31 (H) 0.44 - 1.00 mg/dL Final  95/70/7974 95:89 AM 4.10 (H) 0.44 - 1.00 mg/dL Final  95/71/7974 89:81 AM 8.30 (H) 0.44 -  1.00 mg/dL Final  95/71/7974 89:89 AM 7.51 (H) 0.44 - 1.00 mg/dL Final  97/82/7974 96:63 PM 4.62 (H) 0.44 - 1.00 mg/dL Final    Comment:    DELTA CHECK NOTED  07/13/2023 04:45 AM 3.02 (H) 0.44 - 1.00 mg/dL Final  97/84/7974 95:45 PM 5.02 (H) 0.44 - 1.00 mg/dL Final    Comment:    DELTA CHECK NOTED  07/11/2023 04:04 AM 2.73 (H) 0.44 - 1.00 mg/dL Final  97/86/7974 94:50 AM 3.66 (H) 0.44 - 1.00 mg/dL Final  97/97/7974 95:65 AM 4.95 (H) 0.44 - 1.00 mg/dL Final  97/98/7974 91:88 PM 4.77 (H) 0.44 - 1.00 mg/dL Final  98/75/7974 89:78 AM 4.77 (H) 0.44 - 1.00 mg/dL Final  98/84/7974 94:86 AM 3.68 (H) 0.44 - 1.00 mg/dL Final  98/85/7974 88:73 AM 7.14 (H) 0.44 - 1.00 mg/dL Final  98/85/7974 90:58 AM 6.97 (H) 0.44 - 1.00 mg/dL Final  87/72/7975 95:56 AM 2.88 (H) 0.44 - 1.00 mg/dL Final  87/73/7975 95:52 AM 4.72 (H) 0.44 - 1.00 mg/dL Final  87/74/7975 95:49 AM 3.77 (H) 0.44 - 1.00 mg/dL Final  87/75/7975 95:54 PM 5.34 (H) 0.44 - 1.00 mg/dL Final  87/81/7975 87:59 AM 5.31 (H) 0.44 - 1.00 mg/dL Final  87/90/7975 97:97 PM 5.72 (H) 0.44 - 1.00 mg/dL Final  87/92/7975 95:83 AM 5.65 (H) 0.44 - 1.00 mg/dL Final  87/93/7975 89:91 AM 5.07 (H) 0.44 - 1.00 mg/dL Final  88/81/7975 95:79 AM 5.81 (H) 0.44 - 1.00 mg/dL Final  88/82/7975 94:82 PM 5.82 (H) 0.44 - 1.00 mg/dL Final  88/94/7975 94:64 AM 3.54 (H) 0.44 - 1.00 mg/dL Final  88/95/7975 90:96 AM 5.55 (H) 0.44 - 1.00 mg/dL Final  89/77/7975 95:95 AM 3.29 (H) 0.44 - 1.00 mg/dL Final  89/78/7975 89:97 AM 5.57 (H) 0.44 - 1.00 mg/dL Final  89/89/7975 94:99 AM 2.72 (H) 0.44 - 1.00 mg/dL Final  89/90/7975 95:77 PM 1.87 (H) 0.44 - 1.00 mg/dL Final  91/93/7975 89:98 AM 3.43 (H) 0.44 - 1.00 mg/dL Final  92/75/7975 97:48 PM 6.66 (H) 0.44 - 1.00 mg/dL Final    Comment:    DELTA CHECK NOTED  12/17/2022 03:17 AM 4.97 (H) 0.44 - 1.00 mg/dL Final  92/77/7975 95:40 AM 4.04 (H) 0.44 - 1.00 mg/dL Final  92/78/7975 95:64 AM 6.58 (H) 0.44 - 1.00 mg/dL Final  92/79/7975  95:54 AM 6.12 (H) 0.44 - 1.00 mg/dL Final  92/80/7975 90:99 PM 5.86 (H) 0.44 - 1.00 mg/dL Final  92/90/7975 89:60 AM 7.10 (H) 0.44 - 1.00 mg/dL Final  93/81/7975 95:75 AM 2.92 (H) 0.44 - 1.00 mg/dL Final  93/82/7975 94:85 AM 5.12 (H) 0.44 - 1.00 mg/dL Final  93/85/7975 95:75 PM 1.95 (H) 0.44 - 1.00 mg/dL Final    Comment:    DELTA CHECK NOTED  11/07/2022 08:32 PM 4.01 (H) 0.44 - 1.00 mg/dL Final  94/81/7975 98:50 PM 6.58 (H) 0.44 - 1.00 mg/dL Final  96/83/7975 95:84 PM 4.25 (H) 0.44 - 1.00 mg/dL Final  97/71/7975 95:70 AM 4.59 (H) 0.44 - 1.00 mg/dL Final  97/72/7975 94:65 PM 4.59 (H) 0.44 - 1.00 mg/dL Final   Recent Labs  Lab  11/20/23 1250 11/21/23 0519  NA 135 131*  K 3.2* 4.3  CL 94* 96*  CO2 27 23  GLUCOSE 104* 138*  BUN 36* 52*  CREATININE 3.77* 4.85*  CALCIUM  9.1 8.3*   Recent Labs  Lab 11/20/23 1250 11/21/23 0519  WBC 14.0* 11.2*  NEUTROABS 11.9*  --   HGB 8.2* 7.0*  HCT 28.7* 23.4*  MCV 100.3* 98.7  PLT 374 302   Liver Function Tests: Recent Labs  Lab 11/20/23 1250  AST 20  ALT 17  ALKPHOS 55  BILITOT 0.8  PROT 7.8  ALBUMIN 2.4*   No results for input(s): LIPASE, AMYLASE in the last 168 hours. No results for input(s): AMMONIA in the last 168 hours. Cardiac Enzymes: No results for input(s): CKTOTAL, CKMB, CKMBINDEX, TROPONINI in the last 168 hours. Iron  Studies: No results for input(s): IRON , TIBC, TRANSFERRIN, FERRITIN in the last 72 hours. PT/INR: @LABRCNTIP (inr:5)  Xrays/Other Studies: ) Results for orders placed or performed during the hospital encounter of 11/20/23 (from the past 48 hours)  Comprehensive metabolic panel     Status: Abnormal   Collection Time: 11/20/23 12:50 PM  Result Value Ref Range   Sodium 135 135 - 145 mmol/L   Potassium 3.2 (L) 3.5 - 5.1 mmol/L   Chloride 94 (L) 98 - 111 mmol/L   CO2 27 22 - 32 mmol/L   Glucose, Bld 104 (H) 70 - 99 mg/dL    Comment: Glucose reference range applies only to  samples taken after fasting for at least 8 hours.   BUN 36 (H) 6 - 20 mg/dL   Creatinine, Ser 6.22 (H) 0.44 - 1.00 mg/dL   Calcium  9.1 8.9 - 10.3 mg/dL   Total Protein 7.8 6.5 - 8.1 g/dL   Albumin 2.4 (L) 3.5 - 5.0 g/dL   AST 20 15 - 41 U/L   ALT 17 0 - 44 U/L   Alkaline Phosphatase 55 38 - 126 U/L   Total Bilirubin 0.8 0.0 - 1.2 mg/dL   GFR, Estimated 14 (L) >60 mL/min    Comment: (NOTE) Calculated using the CKD-EPI Creatinine Equation (2021)    Anion gap 14 5 - 15    Comment: Performed at [facility], [address], [place], [place] [address]  Lactic acid, plasma     Status: None   Collection Time: 11/20/23 12:50 PM  Result Value Ref Range   Lactic Acid, Venous 0.9 0.5 - 1.9 mmol/L    Comment: Performed at [facility], [address] [place], [place] [address]  Troponin I (High Sensitivity)     Status: Abnormal   Collection Time: 11/20/23 12:50 PM  Result Value Ref Range   Troponin I (High Sensitivity) 79 (H) <18 ng/L    Comment: (NOTE) Elevated high sensitivity troponin I (hsTnI) values and significant  changes across serial measurements may suggest ACS but many other  chronic and acute conditions are known to elevate hsTnI results.  Refer to the Links section for chest pain algorithms and additional  guidance. Performed at [facility], [address], [place], [place] [address]   Brain natriuretic peptide     Status: Abnormal   Collection Time: 11/20/23 12:50 PM  Result Value Ref Range   B Natriuretic Peptide 452.0 (H) 0.0 - 100.0 pg/mL    Comment: Performed at [facility], [address], [place], [place] [address]  CBC with Differential     Status: Abnormal   Collection Time: 11/20/23 12:50 PM  Result Value Ref Range  WBC 14.0 (H) 4.0 - 10.5 K/uL   RBC 2.86 (L) 3.87 - 5.11 MIL/uL   Hemoglobin 8.2 (L) 12.0 - 15.0 g/dL   HCT 71.2 (L) 63.9 - 53.9 %   MCV 100.3 (H) 80.0 - 100.0 fL   MCH 28.7 26.0 - 34.0 pg   MCHC 28.6 (L) 30.0 - 36.0 g/dL   RDW 82.2 (H) 88.4 -  15.5 %   Platelets 374 150 - 400 K/uL   nRBC 0.0 0.0 - 0.2 %   Neutrophils Relative % 86 %   Neutro Abs 11.9 (H) 1.7 - 7.7 K/uL   Lymphocytes Relative 7 %   Lymphs Abs 1.0 0.7 - 4.0 K/uL   Monocytes Relative 5 %   Monocytes Absolute 0.7 0.1 - 1.0 K/uL   Eosinophils Relative 1 %   Eosinophils Absolute 0.2 0.0 - 0.5 K/uL   Basophils Relative 0 %   Basophils Absolute 0.1 0.0 - 0.1 K/uL   Immature Granulocytes 1 %   Abs Immature Granulocytes 0.12 (H) 0.00 - 0.07 K/uL    Comment: Performed at [facility], [address], [place], [place] [address]  Magnesium      Status: None   Collection Time: 11/20/23 12:50 PM  Result Value Ref Range   Magnesium  1.9 1.7 - 2.4 mg/dL    Comment: Performed at [facility], [address], [place], [place] [address]  Culture, blood (routine x 2)     Status: None (Preliminary result)   Collection Time: 11/20/23 12:50 PM   Specimen: BLOOD  Result Value Ref Range   Specimen Description BLOOD RIGHT ANTECUBITAL    Special Requests      BOTTLES DRAWN AEROBIC AND ANAEROBIC Blood Culture adequate volume   Culture      NO GROWTH < 24 HOURS Performed at [facility], [address] [place], [place] [address]    Report Status PENDING   Protime-INR     Status: Abnormal   Collection Time: 11/20/23 12:50 PM  Result Value Ref Range   Prothrombin Time 15.7 (H) 11.4 - 15.2 seconds   INR 1.2 0.8 - 1.2    Comment: (NOTE) INR goal varies based on device and disease states. Performed at [facility], [address], [place], [place] [address]   Type and screen [facility]     Status: None   Collection Time: 11/20/23 12:50 PM  Result Value Ref Range   ABO/RH(D) A POS    Antibody Screen NEG    Sample Expiration      11/23/2023,2359 Performed at [facility], [address] [place], [place] [address]   Resp panel by RT-PCR (RSV, Flu A&B, Covid) Anterior Nasal Swab     Status: Abnormal   Collection Time: 11/20/23 12:55 PM   Specimen: Anterior Nasal Swab   Result Value Ref Range   SARS Coronavirus 2 by RT PCR POSITIVE (A) NEGATIVE    Comment: (NOTE) SARS-CoV-2 target nucleic acids are DETECTED.  The SARS-CoV-2 RNA is generally detectable in upper respiratory specimens during the acute phase of infection. Positive results are indicative of the presence of the identified virus, but do not rule out bacterial infection or co-infection with other pathogens not detected by the test. Clinical correlation with patient history and other diagnostic information is necessary to determine patient infection status. The expected result is Negative.  Fact Sheet for Patients: BloggerCourse.com  Fact Sheet for Healthcare Providers: SeriousBroker.it  This test is not yet approved or cleared by the [place]  FDA and  has been authorized for detection and/or diagnosis of SARS-CoV-2 by FDA under an Emergency Use Authorization (EUA).  This EUA will remain in effect (meaning this test can be used) for the duration of  the COVID-19 declaration under Section 564(b)(1) of the A ct, 21 U.S.C. section 360bbb-3(b)(1), unless the authorization is terminated or revoked sooner.     Influenza A by PCR NEGATIVE NEGATIVE   Influenza B by PCR NEGATIVE NEGATIVE    Comment: (NOTE) The Xpert Xpress SARS-CoV-2/FLU/RSV plus assay is intended as an aid in the diagnosis of influenza from Nasopharyngeal swab specimens and should not be used as a sole basis for treatment. Nasal washings and aspirates are unacceptable for Xpert Xpress SARS-CoV-2/FLU/RSV testing.  Fact Sheet for Patients: BloggerCourse.com  Fact Sheet for Healthcare Providers: SeriousBroker.it  This test is not yet approved or cleared by the [place]  FDA and has been authorized for detection and/or diagnosis of SARS-CoV-2 by FDA under an Emergency Use Authorization (EUA). This EUA will remain in  effect (meaning this test can be used) for the duration of the COVID-19 declaration under Section 564(b)(1) of the Act, 21 U.S.C. section 360bbb-3(b)(1), unless the authorization is terminated or revoked.     Resp Syncytial Virus by PCR NEGATIVE NEGATIVE    Comment: (NOTE) Fact Sheet for Patients: BloggerCourse.com  Fact Sheet for Healthcare Providers: SeriousBroker.it  This test is not yet approved or cleared by the [place]  FDA and has been authorized for detection and/or diagnosis of SARS-CoV-2 by FDA under an Emergency Use Authorization (EUA). This EUA will remain in effect (meaning this test can be used) for the duration of the COVID-19 declaration under Section 564(b)(1) of the Act, 21 U.S.C. section 360bbb-3(b)(1), unless the authorization is terminated or revoked.  Performed at [facility], [address], [place], [place] [address]   Culture, blood (routine x 2)     Status: None (Preliminary result)   Collection Time: 11/20/23  1:01 PM   Specimen: BLOOD  Result Value Ref Range   Specimen Description BLOOD BLOOD RIGHT HAND    Special Requests      BOTTLES DRAWN AEROBIC ONLY Blood Culture results may not be optimal due to an inadequate volume of blood received in culture bottles   Culture      NO GROWTH < 24 HOURS Performed at [facility], [address] [place], [place] [address]    Report Status PENDING   Lactic acid, plasma     Status: None   Collection Time: 11/20/23  2:45 PM  Result Value Ref Range   Lactic Acid, Venous 1.0 0.5 - 1.9 mmol/L    Comment: Performed at [facility], [address], [place], [place] [address]  Troponin I (High Sensitivity)     Status: Abnormal   Collection Time: 11/20/23  2:45 PM  Result Value Ref Range   Troponin I (High Sensitivity) 54 (H) <18 ng/L    Comment: DELTA CHECK NOTED (NOTE) Elevated high sensitivity troponin I (hsTnI) values and significant  changes across serial  measurements may suggest ACS but many other  chronic and acute conditions are known to elevate hsTnI results.  Refer to the Links section for chest pain algorithms and additional  guidance. Performed at [facility], [address] [place], [place] [address]   MRSA Next Gen by PCR, Nasal     Status: None   Collection Time: 11/21/23  3:39 AM   Specimen: Nasal Mucosa; Nasal Swab  Result Value Ref Range   MRSA by  PCR Next Gen NOT DETECTED NOT DETECTED    Comment: (NOTE) The GeneXpert MRSA Assay (FDA approved for NASAL specimens only), is one component of a comprehensive MRSA colonization surveillance program. It is not intended to diagnose MRSA infection nor to guide or monitor treatment for MRSA infections. Test performance is not FDA approved in patients less than 15 years old. Performed at [facility], [address], [place], [place] [address]   Basic metabolic panel     Status: Abnormal   Collection Time: 11/21/23  5:19 AM  Result Value Ref Range   Sodium 131 (L) 135 - 145 mmol/L   Potassium 4.3 3.5 - 5.1 mmol/L   Chloride 96 (L) 98 - 111 mmol/L   CO2 23 22 - 32 mmol/L   Glucose, Bld 138 (H) 70 - 99 mg/dL    Comment: Glucose reference range applies only to samples taken after fasting for at least 8 hours.   BUN 52 (H) 6 - 20 mg/dL   Creatinine, Ser 5.14 (H) 0.44 - 1.00 mg/dL   Calcium  8.3 (L) 8.9 - 10.3 mg/dL   GFR, Estimated 10 (L) >60 mL/min    Comment: (NOTE) Calculated using the CKD-EPI Creatinine Equation (2021)    Anion gap 12 5 - 15    Comment: Performed at [facility], [address], [place], [place] [address]  CBC     Status: Abnormal   Collection Time: 11/21/23  5:19 AM  Result Value Ref Range   WBC 11.2 (H) 4.0 - 10.5 K/uL   RBC 2.37 (L) 3.87 - 5.11 MIL/uL   Hemoglobin 7.0 (L) 12.0 - 15.0 g/dL   HCT 76.5 (L) 63.9 - 53.9 %   MCV 98.7 80.0 - 100.0 fL   MCH 29.5 26.0 - 34.0 pg   MCHC 29.9 (L) 30.0 - 36.0 g/dL   RDW 82.3 (H) 88.4 - 84.4 %   Platelets 302  150 - 400 K/uL   nRBC 0.0 0.0 - 0.2 %    Comment: Performed at [facility], [address], [place], [place] [address]   CT Angio Chest PE W/Cm &/Or Wo Cm Result Date: 11/20/2023 CLINICAL DATA:  Pulmonary embolism (PE) suspected, high prob; worseing decub wounds. Shortness of breath. EXAM: CT ANGIOGRAPHY CHEST CT ABDOMEN AND PELVIS WITH CONTRAST TECHNIQUE: Multidetector CT imaging of the chest was performed using the standard protocol during bolus administration of intravenous contrast. Multiplanar CT image reconstructions and MIPs were obtained to evaluate the vascular anatomy. Multidetector CT imaging of the abdomen and pelvis was performed using the standard protocol during bolus administration of intravenous contrast. RADIATION DOSE REDUCTION: This exam was performed according to the departmental dose-optimization program which includes automated exposure control, adjustment of the mA and/or kV according to patient size and/or use of iterative reconstruction technique. CONTRAST:  75mL OMNIPAQUE  IOHEXOL  350 MG/ML SOLN COMPARISON:  CT scan chest from 07/10/2023 and CT Angiography GI bleed from 12/31/2022. FINDINGS: CTA CHEST FINDINGS Cardiovascular: Evaluation of pulmonary embolism beyond the segmental branches is limited due to suboptimal contrast-enhancement. There is no embolism to the segmental pulmonary artery level. There is stable moderately enlarged cardiac size. No pericardial effusion. No aortic aneurysm. There are coronary artery calcifications, in keeping with coronary artery disease. There are also mild peripheral atherosclerotic vascular calcifications of thoracic aorta and its major branches. 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 There multiple thin venous collaterals in the right upper hemithorax,  similar to the prior study. Mediastinum/Nodes: Visualized thyroid gland appears grossly  unremarkable. No solid / cystic mediastinal masses. The esophagus is nondistended precluding optimal assessment. No axillary, mediastinal or hilar lymphadenopathy by size criteria. Lungs/Pleura: The central tracheo-bronchial tree is patent. There are segmental atelectatic changes in the bilateral lower lobes, right more than left. There are patchy areas of linear, plate-like atelectasis and/or scarring throughout bilateral lungs. There is mosaic attenuation of lungs, consistent with heterogeneous air trapping related to small airways disease. No mass or consolidation. No pleural effusion or pneumothorax. No suspicious lung nodules. Musculoskeletal: The visualized soft tissues of the chest wall are grossly unremarkable. No suspicious osseous lesions. There are mild to moderate multilevel degenerative changes in the visualized spine. Redemonstration of patient's known posterior spinal fusion as well as anterolisthesis of T6 over T7, unchanged. Review of the MIP images confirms the above findings. CT ABDOMEN and PELVIS FINDINGS Hepatobiliary: The liver is normal in size. Non-cirrhotic configuration. No suspicious mass. No intrahepatic or extrahepatic bile duct dilation. No calcified gallstones. Normal gallbladder wall thickness. No pericholecystic inflammatory changes. Pancreas: Unremarkable. No pancreatic ductal dilatation or surrounding inflammatory changes. Spleen: Within normal limits. No focal lesion. Adrenals/Urinary Tract: Adrenal glands are unremarkable. Redemonstration of small/atrophic bilateral kidneys, right more than left. There is a partially exophytic simple cyst arising from the left kidney upper pole measuring 1.1 x 1.4 cm. No nephroureterolithiasis on either side. Bilateral extrarenal pelves noted. No hydroureteronephrosis on either side. Urinary bladder is decompressed secondary to a suprapubic catheter. Stomach/Bowel: No disproportionate dilation of the small or large bowel loops. No evidence of  abnormal bowel wall thickening or inflammatory changes. The appendix was not visualized; however there is no acute inflammatory process in the right lower quadrant. There is moderate stool burden. Vascular/Lymphatic: No ascites or pneumoperitoneum. No abdominal or pelvic lymphadenopathy, by size criteria. No aneurysmal dilation of the major abdominal arteries. There are mild peripheral atherosclerotic vascular calcifications of the aorta and its major branches. Reproductive: Not well evaluated on this exam. Anteverted uterus noted with endometrium measuring up to 6-7 mm in thickness. No large adnexal mass seen. Correlate clinically to determine the need for additional imaging with pelvic ultrasound. Other: There is soft tissue defect in the posterior aspect of upper left thigh with soft tissue defect reaching up to the proximal left femur bone surface. There is small walled-off abscess/collection in the soft tissue as well measuring at approximately 2.0 x 3.3 cm. There is an additional approximately 1.6 x 7.2 cm soft tissue mass with extensive internal dystrophic calcifications in the midline upper gluteal region, which is present since the prior study. There is small to moderate right hip joint effusion with slightly hyperattenuating walls, raising the concern for chronic joint effusion versus septic joint. Correlate clinically. Musculoskeletal: No suspicious osseous lesions. There are mild multilevel degenerative changes in the visualized spine. Review of the MIP images confirms the above findings. IMPRESSION: 1. No embolism to the segmental pulmonary artery level. 2. There is a decubitus ulcer in the posterior aspect of the upper left thigh reaching up to the bone surface. There is associated small walled-off abscess/collection in the soft tissue measuring up to 2.0 x 3.3 cm. 3. There is a small to moderate right hip joint effusion with slightly hyperattenuating walls, raising the concern for chronic joint  effusion versus septic joint. Correlate clinically. 4. Multiple other nonacute observations, as described above. Electronically Signed   By: Ree Molt M.D.   On: 11/20/2023 14:49   CT  ABDOMEN PELVIS W CONTRAST Result Date: 11/20/2023 CLINICAL DATA:  Pulmonary embolism (PE) suspected, high prob; worseing decub wounds. Shortness of breath. EXAM: CT ANGIOGRAPHY CHEST CT ABDOMEN AND PELVIS WITH CONTRAST TECHNIQUE: Multidetector CT imaging of the chest was performed using the standard protocol during bolus administration of intravenous contrast. Multiplanar CT image reconstructions and MIPs were obtained to evaluate the vascular anatomy. Multidetector CT imaging of the abdomen and pelvis was performed using the standard protocol during bolus administration of intravenous contrast. RADIATION DOSE REDUCTION: This exam was performed according to the departmental dose-optimization program which includes automated exposure control, adjustment of the mA and/or kV according to patient size and/or use of iterative reconstruction technique. CONTRAST:  75mL OMNIPAQUE  IOHEXOL  350 MG/ML SOLN COMPARISON:  CT scan chest from 07/10/2023 and CT Angiography GI bleed from 12/31/2022. FINDINGS: CTA CHEST FINDINGS Cardiovascular: Evaluation of pulmonary embolism beyond the segmental branches is limited due to suboptimal contrast-enhancement. There is no embolism to the segmental pulmonary artery level. There is stable moderately enlarged cardiac size. No pericardial effusion. No aortic aneurysm. There are coronary artery calcifications, in keeping with coronary artery disease. There are also mild peripheral atherosclerotic vascular calcifications of thoracic aorta and its major branches. 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 There multiple thin venous collaterals in the right upper hemithorax, similar to the prior study.  Mediastinum/Nodes: Visualized thyroid gland appears grossly unremarkable. No solid / cystic mediastinal masses. The esophagus is nondistended precluding optimal assessment. No axillary, mediastinal or hilar lymphadenopathy by size criteria. Lungs/Pleura: The central tracheo-bronchial tree is patent. There are segmental atelectatic changes in the bilateral lower lobes, right more than left. There are patchy areas of linear, plate-like atelectasis and/or scarring throughout bilateral lungs. There is mosaic attenuation of lungs, consistent with heterogeneous air trapping related to small airways disease. No mass or consolidation. No pleural effusion or pneumothorax. No suspicious lung nodules. Musculoskeletal: The visualized soft tissues of the chest wall are grossly unremarkable. No suspicious osseous lesions. There are mild to moderate multilevel degenerative changes in the visualized spine. Redemonstration of patient's known posterior spinal fusion as well as anterolisthesis of T6 over T7, unchanged. Review of the MIP images confirms the above findings. CT ABDOMEN and PELVIS FINDINGS Hepatobiliary: The liver is normal in size. Non-cirrhotic configuration. No suspicious mass. No intrahepatic or extrahepatic bile duct dilation. No calcified gallstones. Normal gallbladder wall thickness. No pericholecystic inflammatory changes. Pancreas: Unremarkable. No pancreatic ductal dilatation or surrounding inflammatory changes. Spleen: Within normal limits. No focal lesion. Adrenals/Urinary Tract: Adrenal glands are unremarkable. Redemonstration of small/atrophic bilateral kidneys, right more than left. There is a partially exophytic simple cyst arising from the left kidney upper pole measuring 1.1 x 1.4 cm. No nephroureterolithiasis on either side. Bilateral extrarenal pelves noted. No hydroureteronephrosis on either side. Urinary bladder is decompressed secondary to a suprapubic catheter. Stomach/Bowel: No disproportionate  dilation of the small or large bowel loops. No evidence of abnormal bowel wall thickening or inflammatory changes. The appendix was not visualized; however there is no acute inflammatory process in the right lower quadrant. There is moderate stool burden. Vascular/Lymphatic: No ascites or pneumoperitoneum. No abdominal or pelvic lymphadenopathy, by size criteria. No aneurysmal dilation of the major abdominal arteries. There are mild peripheral atherosclerotic vascular calcifications of the aorta and its major branches. Reproductive: Not well evaluated on this exam. Anteverted uterus noted with endometrium measuring up to 6-7 mm in thickness. No large adnexal  mass seen. Correlate clinically to determine the need for additional imaging with pelvic ultrasound. Other: There is soft tissue defect in the posterior aspect of upper left thigh with soft tissue defect reaching up to the proximal left femur bone surface. There is small walled-off abscess/collection in the soft tissue as well measuring at approximately 2.0 x 3.3 cm. There is an additional approximately 1.6 x 7.2 cm soft tissue mass with extensive internal dystrophic calcifications in the midline upper gluteal region, which is present since the prior study. There is small to moderate right hip joint effusion with slightly hyperattenuating walls, raising the concern for chronic joint effusion versus septic joint. Correlate clinically. Musculoskeletal: No suspicious osseous lesions. There are mild multilevel degenerative changes in the visualized spine. Review of the MIP images confirms the above findings. IMPRESSION: 1. No embolism to the segmental pulmonary artery level. 2. There is a decubitus ulcer in the posterior aspect of the upper left thigh reaching up to the bone surface. There is associated small walled-off abscess/collection in the soft tissue measuring up to 2.0 x 3.3 cm. 3. There is a small to moderate right hip joint effusion with slightly  hyperattenuating walls, raising the concern for chronic joint effusion versus septic joint. Correlate clinically. 4. Multiple other nonacute observations, as described above. Electronically Signed   By: Ree Molt M.D.   On: 11/20/2023 14:49   DG Chest Port 1 View Result Date: 11/20/2023 CLINICAL DATA:  hypoxia EXAM: PORTABLE CHEST - 1 VIEW COMPARISON:  Sep 28, 2023 FINDINGS: Lower lung volumes. No focal airspace consolidation or pneumothorax. Trace right pleural effusion, unchanged. Mild cardiomegaly.No acute fracture or destructive lesion. Bilateral thoracic Harrington fusion rods hardware again noted. IMPRESSION: Unchanged trace right pleural effusion. Otherwise, no acute cardiopulmonary abnormality. Electronically Signed   By: Rogelia Myers M.D.   On: 11/20/2023 13:17    PMH:   Past Medical History:  Diagnosis Date   Abnormal uterine bleeding (AUB) 06/15/2014   Cancer (HCC)    uterine   ESRD on hemodialysis (HCC)    High blood pressure    Paraplegia (lower)    Seizure disorder (HCC)    Seizures (HCC)    Suprapubic catheter (HCC)    Urinary tract infection     PSH:   Past Surgical History:  Procedure Laterality Date   APPLICATION OF WOUND VAC Right 09/13/2021   (approximately 1-96mos ago) pressure sore on right hip   BACK SURGERY     Pt stated before 2000   BIOPSY  12/03/2022   Procedure: BIOPSY;  Surgeon: [name], MD;  Location: [facility];  Service: Gastroenterology;;   ESOPHAGOGASTRODUODENOSCOPY N/A 09/20/2015   Procedure: ESOPHAGOGASTRODUODENOSCOPY (EGD);  Surgeon: [name], MD;  Location: [facility];  Service: Endoscopy;  Laterality: N/A;  730   ESOPHAGOGASTRODUODENOSCOPY (EGD) WITH PROPOFOL  N/A 12/03/2022   Procedure: ESOPHAGOGASTRODUODENOSCOPY (EGD) WITH PROPOFOL ;  Surgeon: [name], MD;  Location: [facility];  Service: Gastroenterology;  Laterality: N/A;  1:15 pm, asa 3, pt knows to arrive at 10:30  dialysis pt, M,W &  F   IR CATHETER TUBE CHANGE  04/02/2018   PERCUTANEOUS ENDOSCOPIC GASTROSTOMY (PEG) REMOVAL N/A 09/20/2015   Procedure: PERCUTANEOUS ENDOSCOPIC GASTROSTOMY (PEG) REMOVAL;  Surgeon: [name], MD;  Location: [facility];  Service: Endoscopy;  Laterality: N/A;   TEE WITHOUT CARDIOVERSION N/A 11/11/2022   Procedure: TRANSESOPHAGEAL ECHOCARDIOGRAM (TEE);  Surgeon: [name], MD;  Location: [facility];  Service: Cardiovascular;  Laterality: N/A;  Allergies:  Allergies  Allergen Reactions   Cefepime  Other (See Comments)    Severe AMS 09/2023 admission   Linezolid Other (See Comments)    Patient self-discontinued treatment due to GI intolerance. Taking it along with moxifloxacin   Moxifloxacin Other (See Comments)    Patient self-discontinued treatment due to GI intolerance. Taking it along with linezolid   Quinine Derivatives Other (See Comments)    Alters mental status   Vancomycin  Other (See Comments)    Unknown  Pt is tolerating this medication at HD   Azithromycin  Itching and Rash   Benadryl [Diphenhydramine Hcl (Sleep)] Hives   Daptomycin Hives   Tetracycline Itching    Able to tolerate Doxycycline .    Zosyn  [Piperacillin  Sod-Tazobactam So] Rash    Medications:   Prior to Admission medications   Medication Sig Start Date End Date Taking? Authorizing Provider  acetaminophen  (TYLENOL ) 500 MG tablet Take 1,000 mg by mouth every 6 (six) hours as needed for moderate pain.   Yes [provider]  albuterol  (PROVENTIL ) (2.5 MG/3ML) 0.083% nebulizer solution Take 3 mLs (2.5 mg total) by nebulization every 4 (four) hours as needed for wheezing or shortness of breath. 04/15/23 04/14/24 Yes Emokpae, Courage, MD  amLODipine  (NORVASC ) 10 MG tablet Take 1 tablet (10 mg total) by mouth every evening. Take in the evenings -- 09/25/23  Yes Pearlean Manus, MD  amoxicillin -clavulanate (AUGMENTIN ) 250-125 MG tablet Take 1 tablet on non-dialysis days once daily, and take 1 tablet twice  daily on dialysis days. 11/20/23  Yes Tobie Suzzane POUR, MD  carvedilol  (COREG ) 12.5 MG tablet Take 1 tablet (12.5 mg total) by mouth 2 (two) times daily with a meal. 08/07/23 11/25/23 Yes [name], [name], FNP  cloNIDine  (CATAPRES  - DOSED IN MG/24 HR) 0.1 mg/24hr patch Place 1 patch (0.1 mg total) onto the skin once a week. 09/18/23  Yes Vannie Reche RAMAN, NP  collagenase  (SANTYL ) 250 UNIT/GM ointment Apply 1 Application topically daily. 11/20/23  Yes Tobie Suzzane POUR, MD  cyclobenzaprine  (FLEXERIL ) 5 MG tablet Take 5 mg by mouth 2 (two) times daily as needed for muscle spasms. 04/11/23  Yes [provider]  feeding supplement (ENSURE ENLIVE / ENSURE PLUS) LIQD Take 237 mLs by mouth 2 (two) times daily between meals. Patient taking differently: Take 237 mLs by mouth 3 (three) times daily between meals. 09/30/23 12/29/23 Yes Cherlyn Labella, MD  furosemide  (LASIX ) 80 MG tablet Take 1 tablet (80 mg total) by mouth every Tuesday, Thursday, Saturday, and Sunday. 09/25/23  Yes Emokpae, Courage, MD  gabapentin  (NEURONTIN ) 100 MG capsule Take 1 capsule (100 mg total) by mouth 2 (two) times daily. 11/11/23  Yes [name], [name], FNP  irbesartan  (AVAPRO ) 150 MG tablet Take 1 tablet (150 mg total) by mouth daily. Take 150 mg by mouth daily. 08/07/23  Yes [name], [name], FNP  levETIRAcetam  (KEPPRA ) 500 MG tablet Take 2 tablets (1,000 mg total) by mouth every evening. Take an additional tablet on Monday,Wednesday and Friday evening after Dialysis 04/15/23  Yes Emokpae, Courage, MD  lidocaine -prilocaine  (EMLA ) cream Apply 1 Application topically 3 (three) times a week. 06/04/22  Yes [provider]  multivitamin (RENA-VIT) TABS tablet Take 1 tablet by mouth daily. 07/29/23  Yes [provider]  pantoprazole  (PROTONIX ) 40 MG tablet Take 1 tablet (40 mg total) by mouth daily. 11/04/23  Yes [name], [name], FNP  senna-docusate (SENOKOT-S) 8.6-50 MG tablet Take 2 tablets by mouth at bedtime. 11/12/22  Yes Pearlean Manus, MD  sevelamer  carbonate (RENVELA ) 800  MG tablet Take 800 mg by mouth 3 (three) times daily. 12/11/22  Yes [provider]    Discontinued Meds:   Medications Discontinued During This Encounter  Medication Reason   cefTRIAXone  (ROCEPHIN ) 2 g in sodium chloride  0.9 % 100 mL IVPB    nirmatrelvir /ritonavir  (renal dosing) (PAXLOVID ) 2 tablet     Social History:  reports that she has never smoked. She has never used smokeless tobacco. She reports current alcohol use. She reports that she does not use drugs.  Family History:   Family History  Problem Relation Age of Onset   Cancer Mother    Hypertension Mother    Cancer Sister        breast and then spread everywhere.   Diabetes Paternal Grandmother    Hypertension Paternal Grandmother     Blood pressure 121/65, pulse 79, temperature 98.8 F (37.1 C), temperature source Oral, resp. rate 17, height 5' 2 (1.575 m), weight 46.4 kg, SpO2 96%. Gen alert and interacting appropriately  No jvd or bruits Chest clear bilat to bases RRR no RG Abd soft ntnd no mass or ascites +bs MS LE's contractured Ext  no LE or UE edema Neuro MS is normal, chronic paraplegic  GU suprapubic catheter in place Button open wound noted in pics; legs contracted LUA BCF strong bruit        Richad Ramsay, LYNWOOD ORN, MD 11/21/2023, 9:21 AM

## 2023-11-21 NOTE — Progress Notes (Unsigned)
 Last note from [facility] 10/23/23  Progress Notes - documented in this encounter Table of Contents for Progress Notes  Curtistine Norleen Recardo Mickey., MD - 10/23/2023 10:30 AM EDT  Particia Ober, RN - 10/23/2023 10:30 AM EDT  Ronal JAYSON Casey, LPN - 94/70/7974 10:30 AM EDT    Curtistine Norleen Recardo Mickey., MD - 10/23/2023 10:30 AM EDT Formatting of this note might be different from the original. 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 EXAM: L ischial wound has granulation over bone . Minimal drainage . Tunnels 1- 1.5 cm a from 1 - 5 o'clock . R ischial wound is small , granulation covering bone . No tunneling . PLAN: Daily antibacterial soap wash, Betadine paint . Offload. >> dietary protein.  Electronically signed by Curtistine Norleen Recardo Mickey., MD at 10/23/2023 10:51 AM EDT  Back to top of Progress Notes Particia Ober, RN - 10/23/2023 10:30 AM EDT Formatting of this note might be different from the original. Dressing applied per providers orders. Patient tolerated procedure well. Patient given AVS.  Electronically signed by Particia Ober, RN at 10/23/2023 11:16 AM EDT  Back to top of Progress Notes Ronal JAYSON Casey, LPN - 94/70/7974 10:30 AM EDT Formatting of this note might be different from the original. Patient expressed need for new hospital bed and support surface mattress as hers is over 13 years old and is no longer working. Order shall be [facility] to [facility].  Electronically signed by Ronal JAYSON Casey, LPN at 93/97/7974 10:16 AM EDT   Patient Instructions - documented in this encounter  Patient Instructions Hunter Barnacle, RN - 10/23/2023 10:30 AM EDT  Formatting of this note might be different from the original. Right ischium: and left ischium - wash with soap and water . Betadine paint and silver  alginate dressings and cover with dry dressing. Change daily  . PROTEIN Increase protein intake to at  least 50 grams daily. You can do this by adding lean meats, eggs, yogurt, beans, low fat dairy products or protein shakes. If able, take a multi-vitamin, especially one with zinc  and vitamin C , over the counter to help with wound healing. If you have diabetes, check your blood sugars as directed by your primary care MD and keep them under control. If your blood sugars are consistently too high, your wound will not heal. . Electronically signed by Hunter Barnacle, RN at 10/23/2023 10:49 AM EDT

## 2023-11-21 NOTE — Care Management Obs Status (Signed)
 MEDICARE OBSERVATION STATUS NOTIFICATION   Patient Details  Name: Rhyann Berton MRN: [id number] Date of Birth: 06-Apr-1966   Medicare Observation Status Notification Given:  Yes    Nena LITTIE Coffee, RN 11/21/2023, 11:42 AM

## 2023-11-21 NOTE — Telephone Encounter (Signed)
 Spoke to pharmacy to cancel rx.

## 2023-11-21 NOTE — Progress Notes (Signed)
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 Topical Once per day on Monday Wednesday Friday   multivitamin  1 tablet Oral Daily   pantoprazole   40 mg Oral Daily   senna-docusate  2 tablet Oral QHS   sevelamer  carbonate  800 mg Oral TID   Continuous Infusions:  sodium chloride  10 mL/hr at 11/20/23 2118   vancomycin  500 mg (11/21/23 1855)     LOS: 0 days    Rendall Carwin M.D on 11/21/2023 at 8:19 PM  Go to [email or web address] - for contact info  [facility] - Office  [phone]  If 7PM-7AM, please contact night-coverage [email or web address] 11/21/2023, 8:19 PM

## 2023-11-21 NOTE — Consult Note (Signed)
 [facility] Consult  Reason for Consult: Left hip wound, concern for associated abscess Referring Physician: Dr. Pearlean  Chief Complaint   hypoxia     HPI: [name] is a 58 y.o. female who presented to the hospital with shortness of breath and worsening decubitus ulcer.  She believes that the ulcer has been present for at least the last few months, and her daughter has been performing daily dressing changes.  They have been performing wet-to-dry dressing changes.  Over the last couple of weeks, her daughter thought the area was worsening and felt like it was tracking to the bone.  She notes that the wound had had bloody drainage, but she was unsure if there was any purulent drainage.  She wanted this area to be evaluated when the patient came to the emergency department.  Patient has a past medical history significant for end-stage renal disease, decubitus ulcers, seizures, and chronic indwelling Foley.  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  She has no sensation at the location of these decubitus ulcers.  Upon evaluation in the emergency department, the patient was hemodynamically stable.  She had a mild leukocytosis of 14.  She underwent a CT of the abdomen and pelvis which demonstrated a decubitus ulcer in the posterior aspect of the left upper hip reaching to the bone surface with and associated small walled off abscess/collection in the soft tissue measuring 2 x 3.3 cm.  Overnight she was febrile to 101.9.  Leukocytosis did improve to 11.2.  Past Medical History:  Diagnosis Date   Abnormal uterine bleeding (AUB) 06/15/2014   Cancer (HCC)    uterine   ESRD on hemodialysis (HCC)    High blood pressure    Paraplegia (lower)    Seizure disorder (HCC)    Seizures (HCC)    Suprapubic catheter (HCC)    Urinary tract infection     Past Surgical History:  Procedure Laterality Date   APPLICATION OF WOUND VAC Right  09/13/2021   (approximately 1-32mos ago) pressure sore on right hip   BACK SURGERY     Pt stated before 2000   BIOPSY  12/03/2022   Procedure: BIOPSY;  Surgeon: [name], MD;  [facility]: [facility];  Service: Gastroenterology;;   ESOPHAGOGASTRODUODENOSCOPY N/A 09/20/2015   Procedure: ESOPHAGOGASTRODUODENOSCOPY (EGD);  Surgeon: [name], MD;  Location: [facility];  Service: Endoscopy;  Laterality: N/A;  730   ESOPHAGOGASTRODUODENOSCOPY (EGD) WITH PROPOFOL  N/A 12/03/2022   Procedure: ESOPHAGOGASTRODUODENOSCOPY (EGD) WITH PROPOFOL ;  Surgeon: [name], MD;  [facility]: [facility];  Service: Gastroenterology;  Laterality: N/A;  1:15 pm, asa 3, pt knows to arrive at 10:30  dialysis pt, M,W & F   IR CATHETER TUBE CHANGE  04/02/2018   PERCUTANEOUS ENDOSCOPIC GASTROSTOMY (PEG) REMOVAL N/A 09/20/2015   Procedure: PERCUTANEOUS ENDOSCOPIC GASTROSTOMY (PEG) REMOVAL;  Surgeon: [name], MD;  [facility];  Service: Endoscopy;  Laterality: N/A;   TEE WITHOUT CARDIOVERSION N/A 11/11/2022   Procedure: TRANSESOPHAGEAL ECHOCARDIOGRAM (TEE);  Surgeon: [name], MD;  Location: [facility];  Service: Cardiovascular;  Laterality: N/A;    Family History  Problem Relation Age of Onset   Cancer Mother    Hypertension Mother    Cancer Sister        breast and then spread everywhere.   Diabetes Paternal Grandmother    Hypertension Paternal Grandmother     Social History   Tobacco Use  Smoking status: Never   Smokeless tobacco: Never  Vaping Use   Vaping status: Never Used  Substance Use Topics   Alcohol use: Yes    Comment: Occassionally   Drug use: No    Medications: I have reviewed the patient's current medications.  Allergies  Allergen Reactions   Cefepime  Other (See Comments)    Severe AMS 09/2023 admission   Linezolid Other (See Comments)    Patient self-discontinued treatment due to GI intolerance. Taking it along with  moxifloxacin   Moxifloxacin Other (See Comments)    Patient self-discontinued treatment due to GI intolerance. Taking it along with linezolid   Quinine Derivatives Other (See Comments)    Alters mental status   Vancomycin  Other (See Comments)    Unknown  Pt is tolerating this medication at HD   Azithromycin  Itching and Rash   Benadryl [Diphenhydramine Hcl (Sleep)] Hives   Daptomycin Hives   Tetracycline Itching    Able to tolerate Doxycycline .    Zosyn  [Piperacillin  Sod-Tazobactam So] Rash     ROS:  Pertinent items are noted in HPI.  Blood pressure 121/65, pulse 79, temperature 98.8 F (37.1 C), temperature source Oral, resp. rate 17, height 5' 2 (1.575 m), weight 46.4 kg, SpO2 96%. Physical Exam Vitals reviewed.  Constitutional:      Appearance: Normal appearance.  HENT:     Head: Normocephalic and atraumatic.   Eyes:     Extraocular Movements: Extraocular movements intact.     Pupils: Pupils are equal, round, and reactive to light.    Cardiovascular:     Rate and Rhythm: Normal rate.  Pulmonary:     Effort: Pulmonary effort is normal.  Abdominal:     General: There is no distension.     Palpations: Abdomen is soft.     Tenderness: There is no abdominal tenderness.   Musculoskeletal:     Cervical back: Normal range of motion.     Comments: Bilateral lower extremity contractures   Skin:    General: Skin is warm and dry.     Comments: Left hip/upper thigh decubitus ulcer with a 2.5 to 3 cm circular wound with purulent drainage, wound tracked 6 cm superiorly, 5 cm medially and slightly inferiorly, and 4 cm laterally; retained gauze noted along the medial aspect of the wound where the wound was noted to track, ecchymotic skin medial to the wound without eschar or evidence of necrosis; after the wound was irrigated, the base appeared to have some pink granulation, but the tracking areas were not able to be visualized  Tracking superiorly and medially, there were  palpable bony erosions and sharp edges of the bone consistent with 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     Results: Results for orders placed or performed during the hospital encounter of 11/20/23 (from the past 48 hours)  Comprehensive metabolic panel     Status: Abnormal   Collection Time: 11/20/23 12:50 PM  Result Value Ref Range   Sodium 135 135 - 145 mmol/L   Potassium 3.2 (L) 3.5 - 5.1 mmol/L   Chloride 94 (L) 98 - 111 mmol/L   CO2 27 22 - 32 mmol/L   Glucose, Bld 104 (H) 70 - 99 mg/dL    Comment: Glucose reference range applies only to samples taken after fasting for at least 8 hours.   BUN 36 (H) 6 - 20 mg/dL   Creatinine, Ser 6.22 (H) 0.44 - 1.00 mg/dL  Calcium  9.1 8.9 - 10.3 mg/dL   Total Protein 7.8 6.5 - 8.1 g/dL   Albumin 2.4 (L) 3.5 - 5.0 g/dL   AST 20 15 - 41 U/L   ALT 17 0 - 44 U/L   Alkaline Phosphatase 55 38 - 126 U/L   Total Bilirubin 0.8 0.0 - 1.2 mg/dL   GFR, Estimated 14 (L) >60 mL/min    Comment: (NOTE) Calculated using the CKD-EPI Creatinine Equation (2021)    Anion gap 14 5 - 15    Comment: Performed at [facility], [address] [place], [place] [address]  Lactic acid, plasma     Status: None   Collection Time: 11/20/23 12:50 PM  Result Value Ref Range   Lactic Acid, Venous 0.9 0.5 - 1.9 mmol/L    Comment: Performed at [facility], [address] [place], [place] [address]  Troponin I (High Sensitivity)     Status: Abnormal   Collection Time: 11/20/23 12:50 PM  Result Value Ref Range   Troponin I (High Sensitivity) 79 (H) <18 ng/L    Comment: (NOTE) Elevated high sensitivity troponin I (hsTnI) values and significant  changes across serial measurements may suggest ACS but many other  chronic and acute conditions are known to elevate hsTnI results.  Refer to the Links section for chest pain algorithms and additional  guidance. Performed at [facility], [address], [place], [place]  [address]   Brain natriuretic peptide     Status: Abnormal   Collection Time: 11/20/23 12:50 PM  Result Value Ref Range   B Natriuretic Peptide 452.0 (H) 0.0 - 100.0 pg/mL    Comment: Performed at [facility], [address], [place], [place] [address]  CBC with Differential     Status: Abnormal   Collection Time: 11/20/23 12:50 PM  Result Value Ref Range   WBC 14.0 (H) 4.0 - 10.5 K/uL   RBC 2.86 (L) 3.87 - 5.11 MIL/uL   Hemoglobin 8.2 (L) 12.0 - 15.0 g/dL   HCT 71.2 (L) 63.9 - 53.9 %   MCV 100.3 (H) 80.0 - 100.0 fL   MCH 28.7 26.0 - 34.0 pg   MCHC 28.6 (L) 30.0 - 36.0 g/dL   RDW 82.2 (H) 88.4 - 84.4 %   Platelets 374 150 - 400 K/uL   nRBC 0.0 0.0 - 0.2 %   Neutrophils Relative % 86 %   Neutro Abs 11.9 (H) 1.7 - 7.7 K/uL   Lymphocytes Relative 7 %   Lymphs Abs 1.0 0.7 - 4.0 K/uL   Monocytes Relative 5 %   Monocytes Absolute 0.7 0.1 - 1.0 K/uL   Eosinophils Relative 1 %   Eosinophils Absolute 0.2 0.0 - 0.5 K/uL   Basophils Relative 0 %   Basophils Absolute 0.1 0.0 - 0.1 K/uL   Immature Granulocytes 1 %   Abs Immature Granulocytes 0.12 (H) 0.00 - 0.07 K/uL    Comment: Performed at [facility], [address] [place], [place] [address]  Magnesium      Status: None   Collection Time: 11/20/23 12:50 PM  Result Value Ref Range   Magnesium  1.9 1.7 - 2.4 mg/dL    Comment: Performed at [facility], [address], [place], [place] [address]  Culture, blood (routine x 2)     Status: None (Preliminary result)   Collection Time: 11/20/23 12:50 PM   Specimen: BLOOD  Result Value Ref Range   Specimen Description BLOOD RIGHT ANTECUBITAL    Special Requests  BOTTLES DRAWN AEROBIC AND ANAEROBIC Blood Culture adequate volume   Culture      NO GROWTH < 24 HOURS Performed at [facility], [address] [place], [place] [address]    Report Status PENDING   Protime-INR     Status: Abnormal   Collection Time: 11/20/23 12:50 PM  Result Value Ref Range   Prothrombin Time 15.7 (H) 11.4 -  15.2 seconds   INR 1.2 0.8 - 1.2    Comment: (NOTE) INR goal varies based on device and disease states. Performed at [facility], [address], [place], [place] [address]   Type and screen [facility]     Status: None   Collection Time: 11/20/23 12:50 PM  Result Value Ref Range   ABO/RH(D) A POS    Antibody Screen NEG    Sample Expiration      11/23/2023,2359 Performed at [facility], [address], [place], [place] [address]   Resp panel by RT-PCR (RSV, Flu A&B, Covid) Anterior Nasal Swab     Status: Abnormal   Collection Time: 11/20/23 12:55 PM   Specimen: Anterior Nasal Swab  Result Value Ref Range   SARS Coronavirus 2 by RT PCR POSITIVE (A) NEGATIVE    Comment: (NOTE) SARS-CoV-2 target nucleic acids are DETECTED.  The SARS-CoV-2 RNA is generally detectable in upper respiratory specimens during the acute phase of infection. Positive results are indicative of the presence of the identified virus, but do not rule out bacterial infection or co-infection with other pathogens not detected by the test. Clinical correlation with patient history and other diagnostic information is necessary to determine patient infection status. The expected result is Negative.  Fact Sheet for Patients: BloggerCourse.com  Fact Sheet for Healthcare Providers: SeriousBroker.it  This test is not yet approved or cleared by the [place]  FDA and  has been authorized for detection and/or diagnosis of SARS-CoV-2 by FDA under an Emergency Use Authorization (EUA).  This EUA will remain in effect (meaning this test can be used) for the duration of  the COVID-19 declaration under Section 564(b)(1) of the A ct, 21 U.S.C. section 360bbb-3(b)(1), unless the authorization is terminated or revoked sooner.     Influenza A by PCR NEGATIVE NEGATIVE   Influenza B by PCR NEGATIVE NEGATIVE    Comment: (NOTE) The Xpert Xpress SARS-CoV-2/FLU/RSV plus  assay is intended as an aid in the diagnosis of influenza from Nasopharyngeal swab specimens and should not be used as a sole basis for treatment. Nasal washings and aspirates are unacceptable for Xpert Xpress SARS-CoV-2/FLU/RSV testing.  Fact Sheet for Patients: BloggerCourse.com  Fact Sheet for Healthcare Providers: SeriousBroker.it  This test is not yet approved or cleared by the [place]  FDA and has been authorized for detection and/or diagnosis of SARS-CoV-2 by FDA under an Emergency Use Authorization (EUA). This EUA will remain in effect (meaning this test can be used) for the duration of the COVID-19 declaration under Section 564(b)(1) of the Act, 21 U.S.C. section 360bbb-3(b)(1), unless the authorization is terminated or revoked.     Resp Syncytial Virus by PCR NEGATIVE NEGATIVE    Comment: (NOTE) Fact Sheet for Patients: BloggerCourse.com  Fact Sheet for Healthcare Providers: SeriousBroker.it  This test is not yet approved or cleared by the [place]  FDA and has been authorized for detection and/or diagnosis of SARS-CoV-2 by FDA under an Emergency Use Authorization (EUA). This EUA will remain in effect (meaning this test can be used) for the duration of the COVID-19 declaration under  Section 564(b)(1) of the Act, 21 U.S.C. section 360bbb-3(b)(1), unless the authorization is terminated or revoked.  Performed at [facility], [address] [place], [place] [address]   Culture, blood (routine x 2)     Status: None (Preliminary result)   Collection Time: 11/20/23  1:01 PM   Specimen: BLOOD  Result Value Ref Range   Specimen Description BLOOD BLOOD RIGHT HAND    Special Requests      BOTTLES DRAWN AEROBIC ONLY Blood Culture results may not be optimal due to an inadequate volume of blood received in culture bottles   Culture      NO GROWTH < 24 HOURS Performed  at [facility], [address], [place], [place] [address]    Report Status PENDING   Lactic acid, plasma     Status: None   Collection Time: 11/20/23  2:45 PM  Result Value Ref Range   Lactic Acid, Venous 1.0 0.5 - 1.9 mmol/L    Comment: Performed at [facility], [address], [place], [place] [address]  Troponin I (High Sensitivity)     Status: Abnormal   Collection Time: 11/20/23  2:45 PM  Result Value Ref Range   Troponin I (High Sensitivity) 54 (H) <18 ng/L    Comment: DELTA CHECK NOTED (NOTE) Elevated high sensitivity troponin I (hsTnI) values and significant  changes across serial measurements may suggest ACS but many other  chronic and acute conditions are known to elevate hsTnI results.  Refer to the Links section for chest pain algorithms and additional  guidance. Performed at [facility], [address], [place], [place] [address]   MRSA Next Gen by PCR, Nasal     Status: None   Collection Time: 11/21/23  3:39 AM   Specimen: Nasal Mucosa; Nasal Swab  Result Value Ref Range   MRSA by PCR Next Gen NOT DETECTED NOT DETECTED    Comment: (NOTE) The GeneXpert MRSA Assay (FDA approved for NASAL specimens only), is one component of a comprehensive MRSA colonization surveillance program. It is not intended to diagnose MRSA infection nor to guide or monitor treatment for MRSA infections. Test performance is not FDA approved in patients less than 22 years old. Performed at [facility], [address], [place], [place] [address]   Basic metabolic panel     Status: Abnormal   Collection Time: 11/21/23  5:19 AM  Result Value Ref Range   Sodium 131 (L) 135 - 145 mmol/L   Potassium 4.3 3.5 - 5.1 mmol/L   Chloride 96 (L) 98 - 111 mmol/L   CO2 23 22 - 32 mmol/L   Glucose, Bld 138 (H) 70 - 99 mg/dL    Comment: Glucose reference range applies only to samples taken after fasting for at least 8 hours.   BUN 52 (H) 6 - 20 mg/dL   Creatinine, Ser 5.14 (H) 0.44 - 1.00 mg/dL    Calcium  8.3 (L) 8.9 - 10.3 mg/dL   GFR, Estimated 10 (L) >60 mL/min    Comment: (NOTE) Calculated using the CKD-EPI Creatinine Equation (2021)    Anion gap 12 5 - 15    Comment: Performed at [facility], [address] [place], [place] [address]  CBC     Status: Abnormal   Collection Time: 11/21/23  5:19 AM  Result Value Ref Range   WBC 11.2 (H) 4.0 - 10.5 K/uL   RBC 2.37 (L) 3.87 - 5.11 MIL/uL   Hemoglobin 7.0 (L) 12.0 - 15.0 g/dL   HCT  23.4 (L) 36.0 - 46.0 %   MCV 98.7 80.0 - 100.0 fL   MCH 29.5 26.0 - 34.0 pg   MCHC 29.9 (L) 30.0 - 36.0 g/dL   RDW 82.3 (H) 88.4 - 84.4 %   Platelets 302 150 - 400 K/uL   nRBC 0.0 0.0 - 0.2 %    Comment: Performed at [facility], [address], [place], [place] [address]    CT Angio Chest PE W/Cm &/Or Wo Cm Result Date: 11/20/2023 CLINICAL DATA:  Pulmonary embolism (PE) suspected, high prob; worseing decub wounds. Shortness of breath. EXAM: CT ANGIOGRAPHY CHEST CT ABDOMEN AND PELVIS WITH CONTRAST TECHNIQUE: Multidetector CT imaging of the chest was performed using the standard protocol during bolus administration of intravenous contrast. Multiplanar CT image reconstructions and MIPs were obtained to evaluate the vascular anatomy. Multidetector CT imaging of the abdomen and pelvis was performed using the standard protocol during bolus administration of intravenous contrast. RADIATION DOSE REDUCTION: This exam was performed according to the departmental dose-optimization program which includes automated exposure control, adjustment of the mA and/or kV according to patient size and/or use of iterative reconstruction technique. CONTRAST:  75mL OMNIPAQUE  IOHEXOL  350 MG/ML SOLN COMPARISON:  CT scan chest from 07/10/2023 and CT Angiography GI bleed from 12/31/2022. FINDINGS: CTA CHEST FINDINGS Cardiovascular: Evaluation of pulmonary embolism beyond the segmental branches is limited due to suboptimal contrast-enhancement. There is no embolism to the segmental  pulmonary artery level. There is stable moderately enlarged cardiac size. No pericardial effusion. No aortic aneurysm. There are coronary artery calcifications, in keeping with coronary artery disease. There are also mild peripheral atherosclerotic vascular calcifications of thoracic aorta and its major branches. 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 There multiple thin venous collaterals in the right upper hemithorax, similar to the prior study. Mediastinum/Nodes: Visualized thyroid gland appears grossly unremarkable. No solid / cystic mediastinal masses. The esophagus is nondistended precluding optimal assessment. No axillary, mediastinal or hilar lymphadenopathy by size criteria. Lungs/Pleura: The central tracheo-bronchial tree is patent. There are segmental atelectatic changes in the bilateral lower lobes, right more than left. There are patchy areas of linear, plate-like atelectasis and/or scarring throughout bilateral lungs. There is mosaic attenuation of lungs, consistent with heterogeneous air trapping related to small airways disease. No mass or consolidation. No pleural effusion or pneumothorax. No suspicious lung nodules. Musculoskeletal: The visualized soft tissues of the chest wall are grossly unremarkable. No suspicious osseous lesions. There are mild to moderate multilevel degenerative changes in the visualized spine. Redemonstration of patient's known posterior spinal fusion as well as anterolisthesis of T6 over T7, unchanged. Review of the MIP images confirms the above findings. CT ABDOMEN and PELVIS FINDINGS Hepatobiliary: The liver is normal in size. Non-cirrhotic configuration. No suspicious mass. No intrahepatic or extrahepatic bile duct dilation. No calcified gallstones. Normal gallbladder wall thickness. No pericholecystic inflammatory changes. Pancreas: Unremarkable. No pancreatic ductal  dilatation or surrounding inflammatory changes. Spleen: Within normal limits. No focal lesion. Adrenals/Urinary Tract: Adrenal glands are unremarkable. Redemonstration of small/atrophic bilateral kidneys, right more than left. There is a partially exophytic simple cyst arising from the left kidney upper pole measuring 1.1 x 1.4 cm. No nephroureterolithiasis on either side. Bilateral extrarenal pelves noted. No hydroureteronephrosis on either side. Urinary bladder is decompressed secondary to a suprapubic catheter. Stomach/Bowel: No disproportionate dilation of the small or large bowel loops. No evidence of abnormal bowel wall thickening or inflammatory changes. The  appendix was not visualized; however there is no acute inflammatory process in the right lower quadrant. There is moderate stool burden. Vascular/Lymphatic: No ascites or pneumoperitoneum. No abdominal or pelvic lymphadenopathy, by size criteria. No aneurysmal dilation of the major abdominal arteries. There are mild peripheral atherosclerotic vascular calcifications of the aorta and its major branches. Reproductive: Not well evaluated on this exam. Anteverted uterus noted with endometrium measuring up to 6-7 mm in thickness. No large adnexal mass seen. Correlate clinically to determine the need for additional imaging with pelvic ultrasound. Other: There is soft tissue defect in the posterior aspect of upper left thigh with soft tissue defect reaching up to the proximal left femur bone surface. There is small walled-off abscess/collection in the soft tissue as well measuring at approximately 2.0 x 3.3 cm. There is an additional approximately 1.6 x 7.2 cm soft tissue mass with extensive internal dystrophic calcifications in the midline upper gluteal region, which is present since the prior study. There is small to moderate right hip joint effusion with slightly hyperattenuating walls, raising the concern for chronic joint effusion versus septic joint.  Correlate clinically. Musculoskeletal: No suspicious osseous lesions. There are mild multilevel degenerative changes in the visualized spine. Review of the MIP images confirms the above findings. IMPRESSION: 1. No embolism to the segmental pulmonary artery level. 2. There is a decubitus ulcer in the posterior aspect of the upper left thigh reaching up to the bone surface. There is associated small walled-off abscess/collection in the soft tissue measuring up to 2.0 x 3.3 cm. 3. There is a small to moderate right hip joint effusion with slightly hyperattenuating walls, raising the concern for chronic joint effusion versus septic joint. Correlate clinically. 4. Multiple other nonacute observations, as described above. Electronically Signed   By: Ree Molt M.D.   On: 11/20/2023 14:49   CT ABDOMEN PELVIS W CONTRAST Result Date: 11/20/2023 CLINICAL DATA:  Pulmonary embolism (PE) suspected, high prob; worseing decub wounds. Shortness of breath. EXAM: CT ANGIOGRAPHY CHEST CT ABDOMEN AND PELVIS WITH CONTRAST TECHNIQUE: Multidetector CT imaging of the chest was performed using the standard protocol during bolus administration of intravenous contrast. Multiplanar CT image reconstructions and MIPs were obtained to evaluate the vascular anatomy. Multidetector CT imaging of the abdomen and pelvis was performed using the standard protocol during bolus administration of intravenous contrast. RADIATION DOSE REDUCTION: This exam was performed according to the departmental dose-optimization program which includes automated exposure control, adjustment of the mA and/or kV according to patient size and/or use of iterative reconstruction technique. CONTRAST:  75mL OMNIPAQUE  IOHEXOL  350 MG/ML SOLN COMPARISON:  CT scan chest from 07/10/2023 and CT Angiography GI bleed from 12/31/2022. FINDINGS: CTA CHEST FINDINGS Cardiovascular: Evaluation of pulmonary embolism beyond the segmental branches is limited due to suboptimal  contrast-enhancement. There is no embolism to the segmental pulmonary artery level. There is stable moderately enlarged cardiac size. No pericardial effusion. No aortic aneurysm. There are coronary artery calcifications, in keeping with coronary artery disease. There are also mild peripheral atherosclerotic vascular calcifications of thoracic aorta and its major branches. 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 There multiple thin venous collaterals in the right upper hemithorax, similar to the prior study. Mediastinum/Nodes: Visualized thyroid gland appears grossly unremarkable. No solid / cystic mediastinal masses. The esophagus is nondistended precluding optimal assessment. No axillary, mediastinal or hilar lymphadenopathy by size criteria. Lungs/Pleura: The central tracheo-bronchial tree is patent. There are  segmental atelectatic changes in the bilateral lower lobes, right more than left. There are patchy areas of linear, plate-like atelectasis and/or scarring throughout bilateral lungs. There is mosaic attenuation of lungs, consistent with heterogeneous air trapping related to small airways disease. No mass or consolidation. No pleural effusion or pneumothorax. No suspicious lung nodules. Musculoskeletal: The visualized soft tissues of the chest wall are grossly unremarkable. No suspicious osseous lesions. There are mild to moderate multilevel degenerative changes in the visualized spine. Redemonstration of patient's known posterior spinal fusion as well as anterolisthesis of T6 over T7, unchanged. Review of the MIP images confirms the above findings. CT ABDOMEN and PELVIS FINDINGS Hepatobiliary: The liver is normal in size. Non-cirrhotic configuration. No suspicious mass. No intrahepatic or extrahepatic bile duct dilation. No calcified gallstones. Normal gallbladder wall thickness. No pericholecystic  inflammatory changes. Pancreas: Unremarkable. No pancreatic ductal dilatation or surrounding inflammatory changes. Spleen: Within normal limits. No focal lesion. Adrenals/Urinary Tract: Adrenal glands are unremarkable. Redemonstration of small/atrophic bilateral kidneys, right more than left. There is a partially exophytic simple cyst arising from the left kidney upper pole measuring 1.1 x 1.4 cm. No nephroureterolithiasis on either side. Bilateral extrarenal pelves noted. No hydroureteronephrosis on either side. Urinary bladder is decompressed secondary to a suprapubic catheter. Stomach/Bowel: No disproportionate dilation of the small or large bowel loops. No evidence of abnormal bowel wall thickening or inflammatory changes. The appendix was not visualized; however there is no acute inflammatory process in the right lower quadrant. There is moderate stool burden. Vascular/Lymphatic: No ascites or pneumoperitoneum. No abdominal or pelvic lymphadenopathy, by size criteria. No aneurysmal dilation of the major abdominal arteries. There are mild peripheral atherosclerotic vascular calcifications of the aorta and its major branches. Reproductive: Not well evaluated on this exam. Anteverted uterus noted with endometrium measuring up to 6-7 mm in thickness. No large adnexal mass seen. Correlate clinically to determine the need for additional imaging with pelvic ultrasound. Other: There is soft tissue defect in the posterior aspect of upper left thigh with soft tissue defect reaching up to the proximal left femur bone surface. There is small walled-off abscess/collection in the soft tissue as well measuring at approximately 2.0 x 3.3 cm. There is an additional approximately 1.6 x 7.2 cm soft tissue mass with extensive internal dystrophic calcifications in the midline upper gluteal region, which is present since the prior study. There is small to moderate right hip joint effusion with slightly hyperattenuating walls,  raising the concern for chronic joint effusion versus septic joint. Correlate clinically. Musculoskeletal: No suspicious osseous lesions. There are mild multilevel degenerative changes in the visualized spine. Review of the MIP images confirms the above findings. IMPRESSION: 1. No embolism to the segmental pulmonary artery level. 2. There is a decubitus ulcer in the posterior aspect of the upper left thigh reaching up to the bone surface. There is associated small walled-off abscess/collection in the soft tissue measuring up to 2.0 x 3.3 cm. 3. There is a small to moderate right hip joint effusion with slightly hyperattenuating walls, raising the concern for chronic joint effusion versus septic joint. Correlate clinically. 4. Multiple other nonacute observations, as described above. Electronically Signed   By: Ree Molt M.D.   On: 11/20/2023 14:49   DG Chest Port 1 View Result Date: 11/20/2023 CLINICAL DATA:  hypoxia EXAM: PORTABLE CHEST - 1 VIEW COMPARISON:  Sep 28, 2023 FINDINGS: Lower lung volumes. No focal airspace consolidation or pneumothorax. Trace right pleural effusion, unchanged. Mild cardiomegaly.No acute fracture or destructive lesion.  Bilateral thoracic Harrington fusion rods hardware again noted. IMPRESSION: Unchanged trace right pleural effusion. Otherwise, no acute cardiopulmonary abnormality. Electronically Signed   By: Rogelia Myers M.D.   On: 11/20/2023 13:17     Assessment & Plan:  [name] is a 58 y.o. female who was admitted with acute hypoxic respiratory failure and concern for an infected left hip decubitus ulcer.  Imaging and blood work evaluated by myself.  -Patient's left hip wound was thoroughly evaluated and noted to have no obvious fluid collections, however there was a retained piece of old gauze in the medial aspect of the incision which was the likely nidus of infection at this site.  There was purulent drainage present which was thoroughly irrigated. -No  further purulent drainage noted after irrigation -There was also palpable bony erosions consistent with osteomyelitis of the left femur, as the wound was noted to track down to the femur on imaging -Recommend discussion of femur osteomyelitis with orthopedic surgery to determine if there is any further interventions necessary other than local wound care. -Recommend daily wet-to-dry dressing changes to the wound with Kerlix.  Would make sure that the areas of tracking are also packed. -Discussed with the patient and her daughter that she has some skin changes overlying the area of tracking medially, but there is not evidence of ischemia.  Given her paraplegic state, I would not want to remove more tissue unless absolutely necessary, as it will create a larger wound that will be more difficult to heal -Would continue IV antibiotics for the patient.  Will defer to hospitalist/orthopedic surgery regarding duration -Care per hospitalist  All questions were answered to the satisfaction of the patient and family.  Note: Portions of this report may have been transcribed using voice recognition software. Every effort has been made to ensure accuracy; however, inadvertent computerized transcription errors may still be present.   -- Dorothyann Brittle, DO [facility] [address] [place], [place] [address] [phone] (office)

## 2023-11-21 NOTE — Progress Notes (Signed)
 Pt completed HD treatment without issues. Vancomycin  500 mg IV given at last 1 hour.  11/21/23 1915  Vitals  Temp 98.4 F (36.9 C)  Temp Source Axillary  BP 136/60  BP Method Automatic  Patient Position (if appropriate) Lying  Resp 18  Oxygen Therapy  SpO2 100 %  O2 Device Nasal Cannula  O2 Flow Rate (L/min) 2 L/min  During Treatment Monitoring  HD Safety Checks Performed Yes  Intra-Hemodialysis Comments Tx completed  Post Treatment  Dialyzer Clearance Lightly streaked  Hemodialysis Intake (mL) 0 mL  Liters Processed 72  Fluid Removed (mL) 1400 mL  Tolerated HD Treatment Yes  Post-Hemodialysis Comments Pt goal met.  AVG/AVF Arterial Site Held (minutes) 10 minutes  AVG/AVF Venous Site Held (minutes) 10 minutes

## 2023-11-21 NOTE — Consult Note (Signed)
 Reason for Consult: Decubitus ulcer Referring Physician: Pearlean Manus, MD Attending   [name] is an 58 y.o. female.  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 thought to have sepsis seems to have defervesced and is feeling pretty good now  1 issue may be the type of bed she has at home it does not seem like it is a appropriate mattress for her condition  In any event no surgical intervention is required at this time.  I would not feel comfortable trying to take her to surgery.  Any surgical intervention should be done at an appropriate hospital in terms of level of care.  She would need multi disciplinary intervention to include  General Surgery  Plastic surgery  Orthopedic surgery  Infectious disease    Past Medical History:  Diagnosis Date   Abnormal uterine bleeding (AUB) 06/15/2014   Cancer (HCC)    uterine   ESRD on hemodialysis (HCC)    High blood pressure    Paraplegia (lower)    Seizure disorder (HCC)    Seizures (HCC)    Suprapubic catheter (HCC)    Urinary tract infection     Past Surgical History:  Procedure Laterality Date   APPLICATION OF WOUND VAC Right 09/13/2021   (approximately 1-43mos ago) pressure sore on right hip   BACK SURGERY     Pt stated before 2000   BIOPSY  12/03/2022   Procedure: BIOPSY;  Surgeon: [name], MD;  [facility];  Service: Gastroenterology;;   ESOPHAGOGASTRODUODENOSCOPY N/A 09/20/2015   Procedure: ESOPHAGOGASTRODUODENOSCOPY (EGD);  Surgeon: [name], MD;  [facility];  Service: Endoscopy;  Laterality: N/A;  730   ESOPHAGOGASTRODUODENOSCOPY (EGD) WITH PROPOFOL  N/A 12/03/2022   Procedure: ESOPHAGOGASTRODUODENOSCOPY (EGD) WITH PROPOFOL ;  Surgeon: [name], MD;  [facility];  Service:  Gastroenterology;  Laterality: N/A;  1:15 pm, asa 3, pt knows to arrive at 10:30  dialysis pt, M,W & F   IR CATHETER TUBE CHANGE  04/02/2018   PERCUTANEOUS ENDOSCOPIC GASTROSTOMY (PEG) REMOVAL N/A 09/20/2015   Procedure: PERCUTANEOUS ENDOSCOPIC GASTROSTOMY (PEG) REMOVAL;  Surgeon: [name], MD;  [facility];  Service: Endoscopy;  Laterality: N/A;   TEE WITHOUT CARDIOVERSION N/A 11/11/2022   Procedure: TRANSESOPHAGEAL ECHOCARDIOGRAM (TEE);  Surgeon: [name], MD;  Location: [facility];  Service: Cardiovascular;  Laterality: N/A;    Family History  Problem Relation Age of Onset   Cancer Mother    Hypertension Mother    Cancer Sister        breast and then spread everywhere.   Diabetes Paternal Grandmother    Hypertension Paternal Grandmother     Social History:  reports that she has never smoked. She has never used smokeless tobacco. She reports current alcohol use. She reports that she does not use drugs.  Allergies:  Allergies  Allergen Reactions   Cefepime  Other (See Comments)    Severe AMS 09/2023 admission   Linezolid Other (See Comments)    Patient self-discontinued treatment due to GI intolerance. Taking it along with moxifloxacin   Moxifloxacin Other (See Comments)    Patient self-discontinued treatment due to GI intolerance. Taking it along with linezolid   Quinine Derivatives Other (See Comments)    Alters mental status   Vancomycin  Other (See Comments)    Unknown  Pt is tolerating this medication at HD   Azithromycin  Itching and Rash   Benadryl [Diphenhydramine Hcl (Sleep)] Hives   Daptomycin Hives   Tetracycline Itching    Able to tolerate Doxycycline .    Zosyn  [Piperacillin  Sod-Tazobactam So] Rash      Results for orders placed or performed during the hospital encounter of 11/20/23 (from the past 48 hours)  Comprehensive metabolic panel     Status: Abnormal   Collection Time: 11/20/23 12:50 PM  Result Value Ref Range   Sodium 135 135 -  145 mmol/L   Potassium 3.2 (L) 3.5 - 5.1 mmol/L   Chloride 94 (L) 98 - 111 mmol/L   CO2 27 22 - 32 mmol/L   Glucose, Bld 104 (H) 70 - 99 mg/dL    Comment: Glucose reference range applies only to samples taken after fasting for at least 8 hours.   BUN 36 (H) 6 - 20 mg/dL   Creatinine, Ser 6.22 (H) 0.44 - 1.00 mg/dL   Calcium  9.1 8.9 - 10.3 mg/dL   Total Protein 7.8 6.5 - 8.1 g/dL   Albumin 2.4 (L) 3.5 - 5.0 g/dL   AST 20 15 - 41 U/L   ALT 17 0 - 44 U/L   Alkaline Phosphatase 55 38 - 126 U/L   Total Bilirubin 0.8 0.0 - 1.2 mg/dL   GFR, Estimated 14 (L) >60 mL/min    Comment: (NOTE) Calculated using the CKD-EPI Creatinine Equation (2021)    Anion gap 14 5 - 15    Comment: Performed at [facility], [address], [place], [place] [address]  Lactic acid, plasma     Status: None   Collection Time: 11/20/23 12:50 PM  Result Value Ref Range   Lactic Acid, Venous 0.9 0.5 - 1.9 mmol/L    Comment: Performed at [facility], [address] [place], [place] [address]  Troponin I (High Sensitivity)     Status: Abnormal   Collection Time: 11/20/23 12:50 PM  Result Value Ref Range   Troponin I (High Sensitivity) 79 (H) <18 ng/L    Comment: (NOTE) Elevated high sensitivity troponin I (hsTnI) values and significant  changes across serial measurements may suggest ACS but many other  chronic and acute conditions are known to elevate hsTnI results.  Refer to the Links section for chest pain algorithms and additional  guidance. Performed at [facility], [address], [place], [place] [address]   Brain natriuretic peptide     Status: Abnormal   Collection Time: 11/20/23 12:50 PM  Result Value Ref Range   B Natriuretic Peptide 452.0 (H) 0.0 - 100.0 pg/mL    Comment: Performed at [facility], [address], [place], [place] [address]  CBC with Differential     Status: Abnormal   Collection Time: 11/20/23 12:50 PM  Result Value Ref Range   WBC 14.0 (H) 4.0 - 10.5 K/uL   RBC 2.86 (L) 3.87 -  5.11 MIL/uL   Hemoglobin 8.2 (L) 12.0 - 15.0 g/dL   HCT 71.2 (L) 63.9 - 53.9 %   MCV 100.3 (H) 80.0 - 100.0 fL   MCH 28.7 26.0 - 34.0 pg   MCHC 28.6 (L) 30.0 - 36.0 g/dL   RDW 82.2 (H) 88.4 - 84.4 %   Platelets 374 150 - 400 K/uL   nRBC 0.0 0.0 - 0.2 %   Neutrophils Relative % 86 %   Neutro Abs 11.9 (H) 1.7 - 7.7 K/uL   Lymphocytes Relative 7 %   Lymphs  Abs 1.0 0.7 - 4.0 K/uL   Monocytes Relative 5 %   Monocytes Absolute 0.7 0.1 - 1.0 K/uL   Eosinophils Relative 1 %   Eosinophils Absolute 0.2 0.0 - 0.5 K/uL   Basophils Relative 0 %   Basophils Absolute 0.1 0.0 - 0.1 K/uL   Immature Granulocytes 1 %   Abs Immature Granulocytes 0.12 (H) 0.00 - 0.07 K/uL    Comment: Performed at [facility], [address] [place], [place] [address]  Magnesium      Status: None   Collection Time: 11/20/23 12:50 PM  Result Value Ref Range   Magnesium  1.9 1.7 - 2.4 mg/dL    Comment: Performed at [facility], [address], [place], [place] [address]  Culture, blood (routine x 2)     Status: None (Preliminary result)   Collection Time: 11/20/23 12:50 PM   Specimen: BLOOD  Result Value Ref Range   Specimen Description BLOOD RIGHT ANTECUBITAL    Special Requests      BOTTLES DRAWN AEROBIC AND ANAEROBIC Blood Culture adequate volume   Culture      NO GROWTH < 24 HOURS Performed at [facility], [address], [place], [place] [address]    Report Status PENDING   Protime-INR     Status: Abnormal   Collection Time: 11/20/23 12:50 PM  Result Value Ref Range   Prothrombin Time 15.7 (H) 11.4 - 15.2 seconds   INR 1.2 0.8 - 1.2    Comment: (NOTE) INR goal varies based on device and disease states. Performed at [facility], [address] [place], [place] [address]   Type and screen [facility]     Status: None   Collection Time: 11/20/23 12:50 PM  Result Value Ref Range   ABO/RH(D) A POS    Antibody Screen NEG    Sample Expiration      11/23/2023,2359 Performed at [facility],  [address], [place], [place] [address]   Resp panel by RT-PCR (RSV, Flu A&B, Covid) Anterior Nasal Swab     Status: Abnormal   Collection Time: 11/20/23 12:55 PM   Specimen: Anterior Nasal Swab  Result Value Ref Range   SARS Coronavirus 2 by RT PCR POSITIVE (A) NEGATIVE    Comment: (NOTE) SARS-CoV-2 target nucleic acids are DETECTED.  The SARS-CoV-2 RNA is generally detectable in upper respiratory specimens during the acute phase of infection. Positive results are indicative of the presence of the identified virus, but do not rule out bacterial infection or co-infection with other pathogens not detected by the test. Clinical correlation with patient history and other diagnostic information is necessary to determine patient infection status. The expected result is Negative.  Fact Sheet for Patients: BloggerCourse.com  Fact Sheet for Healthcare Providers: SeriousBroker.it  This test is not yet approved or cleared by the [place]  FDA and  has been authorized for detection and/or diagnosis of SARS-CoV-2 by FDA under an Emergency Use Authorization (EUA).  This EUA will remain in effect (meaning this test can be used) for the duration of  the COVID-19 declaration under Section 564(b)(1) of the A ct, 21 U.S.C. section 360bbb-3(b)(1), unless the authorization is terminated or revoked sooner.     Influenza A by PCR NEGATIVE NEGATIVE   Influenza B by PCR NEGATIVE NEGATIVE    Comment: (NOTE) The Xpert Xpress SARS-CoV-2/FLU/RSV plus assay is intended as an aid in the diagnosis of influenza from Nasopharyngeal swab specimens and should not be used as a sole basis for treatment.  Nasal washings and aspirates are unacceptable for Xpert Xpress SARS-CoV-2/FLU/RSV testing.  Fact Sheet for Patients: BloggerCourse.com  Fact Sheet for Healthcare Providers: SeriousBroker.it  This test is not  yet approved or cleared by the [place]  FDA and has been authorized for detection and/or diagnosis of SARS-CoV-2 by FDA under an Emergency Use Authorization (EUA). This EUA will remain in effect (meaning this test can be used) for the duration of the COVID-19 declaration under Section 564(b)(1) of the Act, 21 U.S.C. section 360bbb-3(b)(1), unless the authorization is terminated or revoked.     Resp Syncytial Virus by PCR NEGATIVE NEGATIVE    Comment: (NOTE) Fact Sheet for Patients: BloggerCourse.com  Fact Sheet for Healthcare Providers: SeriousBroker.it  This test is not yet approved or cleared by the [place]  FDA and has been authorized for detection and/or diagnosis of SARS-CoV-2 by FDA under an Emergency Use Authorization (EUA). This EUA will remain in effect (meaning this test can be used) for the duration of the COVID-19 declaration under Section 564(b)(1) of the Act, 21 U.S.C. section 360bbb-3(b)(1), unless the authorization is terminated or revoked.  Performed at [facility], [address], [place], [place] [address]   Culture, blood (routine x 2)     Status: None (Preliminary result)   Collection Time: 11/20/23  1:01 PM   Specimen: BLOOD  Result Value Ref Range   Specimen Description BLOOD BLOOD RIGHT HAND    Special Requests      BOTTLES DRAWN AEROBIC ONLY Blood Culture results may not be optimal due to an inadequate volume of blood received in culture bottles   Culture      NO GROWTH < 24 HOURS Performed at [facility], [address] [place], [place] [address]    Report Status PENDING   Lactic acid, plasma     Status: None   Collection Time: 11/20/23  2:45 PM  Result Value Ref Range   Lactic Acid, Venous 1.0 0.5 - 1.9 mmol/L    Comment: Performed at [facility], [address], [place], [place] [address]  Troponin I (High Sensitivity)     Status: Abnormal   Collection Time: 11/20/23  2:45 PM  Result  Value Ref Range   Troponin I (High Sensitivity) 54 (H) <18 ng/L    Comment: DELTA CHECK NOTED (NOTE) Elevated high sensitivity troponin I (hsTnI) values and significant  changes across serial measurements may suggest ACS but many other  chronic and acute conditions are known to elevate hsTnI results.  Refer to the Links section for chest pain algorithms and additional  guidance. Performed at [facility], [address], [place], [place] [address]   MRSA Next Gen by PCR, Nasal     Status: None   Collection Time: 11/21/23  3:39 AM   Specimen: Nasal Mucosa; Nasal Swab  Result Value Ref Range   MRSA by PCR Next Gen NOT DETECTED NOT DETECTED    Comment: (NOTE) The GeneXpert MRSA Assay (FDA approved for NASAL specimens only), is one component of a comprehensive MRSA colonization surveillance program. It is not intended to diagnose MRSA infection nor to guide or monitor treatment for MRSA infections. Test performance is not FDA approved in patients less than 33 years old. Performed at [facility], [address] [place], [place] [address]   Basic metabolic panel     Status: Abnormal   Collection Time: 11/21/23  5:19 AM  Result Value Ref Range   Sodium 131 (L) 135 - 145 mmol/L   Potassium 4.3 3.5 -  5.1 mmol/L   Chloride 96 (L) 98 - 111 mmol/L   CO2 23 22 - 32 mmol/L   Glucose, Bld 138 (H) 70 - 99 mg/dL    Comment: Glucose reference range applies only to samples taken after fasting for at least 8 hours.   BUN 52 (H) 6 - 20 mg/dL   Creatinine, Ser 5.14 (H) 0.44 - 1.00 mg/dL   Calcium  8.3 (L) 8.9 - 10.3 mg/dL   GFR, Estimated 10 (L) >60 mL/min    Comment: (NOTE) Calculated using the CKD-EPI Creatinine Equation (2021)    Anion gap 12 5 - 15    Comment: Performed at [facility], [address], [place], [place] [address]  CBC     Status: Abnormal   Collection Time: 11/21/23  5:19 AM  Result Value Ref Range   WBC 11.2 (H) 4.0 - 10.5 K/uL   RBC 2.37 (L) 3.87 - 5.11 MIL/uL    Hemoglobin 7.0 (L) 12.0 - 15.0 g/dL   HCT 76.5 (L) 63.9 - 53.9 %   MCV 98.7 80.0 - 100.0 fL   MCH 29.5 26.0 - 34.0 pg   MCHC 29.9 (L) 30.0 - 36.0 g/dL   RDW 82.3 (H) 88.4 - 84.4 %   Platelets 302 150 - 400 K/uL   nRBC 0.0 0.0 - 0.2 %    Comment: Performed at [facility], [address] [place], [place] [address]    CT Angio Chest PE W/Cm &/Or Wo Cm Result Date: 11/20/2023 CLINICAL DATA:  Pulmonary embolism (PE) suspected, high prob; worseing decub wounds. Shortness of breath. EXAM: CT ANGIOGRAPHY CHEST CT ABDOMEN AND PELVIS WITH CONTRAST TECHNIQUE: Multidetector CT imaging of the chest was performed using the standard protocol during bolus administration of intravenous contrast. Multiplanar CT image reconstructions and MIPs were obtained to evaluate the vascular anatomy. Multidetector CT imaging of the abdomen and pelvis was performed using the standard protocol during bolus administration of intravenous contrast. RADIATION DOSE REDUCTION: This exam was performed according to the departmental dose-optimization program which includes automated exposure control, adjustment of the mA and/or kV according to patient size and/or use of iterative reconstruction technique. CONTRAST:  75mL OMNIPAQUE  IOHEXOL  350 MG/ML SOLN COMPARISON:  CT scan chest from 07/10/2023 and CT Angiography GI bleed from 12/31/2022. FINDINGS: CTA CHEST FINDINGS Cardiovascular: Evaluation of pulmonary embolism beyond the segmental branches is limited due to suboptimal contrast-enhancement. There is no embolism to the segmental pulmonary artery level. There is stable moderately enlarged cardiac size. No pericardial effusion. No aortic aneurysm. There are coronary artery calcifications, in keeping with coronary artery disease. There are also mild peripheral atherosclerotic vascular calcifications of thoracic aorta and its major branches. There is markedly dilated superficial vein in the left arm region, likely related to arteriovenous  fistula in the settings of hemodialysis. There multiple thin venous collaterals in the right upper hemithorax, similar to the prior study. Mediastinum/Nodes: Visualized thyroid gland appears grossly unremarkable. No solid / cystic mediastinal masses. The esophagus is nondistended precluding optimal assessment. No axillary, mediastinal or hilar lymphadenopathy by size criteria. Lungs/Pleura: The central tracheo-bronchial tree is patent. There are segmental atelectatic changes in the bilateral lower lobes, right more than left. There are patchy areas of linear, plate-like atelectasis and/or scarring throughout bilateral lungs. There is mosaic attenuation of lungs, consistent with heterogeneous air trapping related to small airways disease. No mass or consolidation. No pleural effusion or pneumothorax. No suspicious lung nodules. Musculoskeletal: The visualized soft tissues of the chest wall are grossly unremarkable. No  suspicious osseous lesions. There are mild to moderate multilevel degenerative changes in the visualized spine. Redemonstration of patient's known posterior spinal fusion as well as anterolisthesis of T6 over T7, unchanged. Review of the MIP images confirms the above findings. CT ABDOMEN and PELVIS FINDINGS Hepatobiliary: The liver is normal in size. Non-cirrhotic configuration. No suspicious mass. No intrahepatic or extrahepatic bile duct dilation. No calcified gallstones. Normal gallbladder wall thickness. No pericholecystic inflammatory changes. Pancreas: Unremarkable. No pancreatic ductal dilatation or surrounding inflammatory changes. Spleen: Within normal limits. No focal lesion. Adrenals/Urinary Tract: Adrenal glands are unremarkable. Redemonstration of small/atrophic bilateral kidneys, right more than left. There is a partially exophytic simple cyst arising from the left kidney upper pole measuring 1.1 x 1.4 cm. No nephroureterolithiasis on either side. Bilateral extrarenal pelves noted. No  hydroureteronephrosis on either side. Urinary bladder is decompressed secondary to a suprapubic catheter. Stomach/Bowel: No disproportionate dilation of the small or large bowel loops. No evidence of abnormal bowel wall thickening or inflammatory changes. The appendix was not visualized; however there is no acute inflammatory process in the right lower quadrant. There is moderate stool burden. Vascular/Lymphatic: No ascites or pneumoperitoneum. No abdominal or pelvic lymphadenopathy, by size criteria. No aneurysmal dilation of the major abdominal arteries. There are mild peripheral atherosclerotic vascular calcifications of the aorta and its major branches. Reproductive: Not well evaluated on this exam. Anteverted uterus noted with endometrium measuring up to 6-7 mm in thickness. No large adnexal mass seen. Correlate clinically to determine the need for additional imaging with pelvic ultrasound. Other: There is soft tissue defect in the posterior aspect of upper left thigh with soft tissue defect reaching up to the proximal left femur bone surface. There is small walled-off abscess/collection in the soft tissue as well measuring at approximately 2.0 x 3.3 cm. There is an additional approximately 1.6 x 7.2 cm soft tissue mass with extensive internal dystrophic calcifications in the midline upper gluteal region, which is present since the prior study. There is small to moderate right hip joint effusion with slightly hyperattenuating walls, raising the concern for chronic joint effusion versus septic joint. Correlate clinically. Musculoskeletal: No suspicious osseous lesions. There are mild multilevel degenerative changes in the visualized spine. Review of the MIP images confirms the above findings. IMPRESSION: 1. No embolism to the segmental pulmonary artery level. 2. There is a decubitus ulcer in the posterior aspect of the upper left thigh reaching up to the bone surface. There is associated small walled-off  abscess/collection in the soft tissue measuring up to 2.0 x 3.3 cm. 3. There is a small to moderate right hip joint effusion with slightly hyperattenuating walls, raising the concern for chronic joint effusion versus septic joint. Correlate clinically. 4. Multiple other nonacute observations, as described above. Electronically Signed   By: Ree Molt M.D.   On: 11/20/2023 14:49   CT ABDOMEN PELVIS W CONTRAST Result Date: 11/20/2023 CLINICAL DATA:  Pulmonary embolism (PE) suspected, high prob; worseing decub wounds. Shortness of breath. EXAM: CT ANGIOGRAPHY CHEST CT ABDOMEN AND PELVIS WITH CONTRAST TECHNIQUE: Multidetector CT imaging of the chest was performed using the standard protocol during bolus administration of intravenous contrast. Multiplanar CT image reconstructions and MIPs were obtained to evaluate the vascular anatomy. Multidetector CT imaging of the abdomen and pelvis was performed using the standard protocol during bolus administration of intravenous contrast. RADIATION DOSE REDUCTION: This exam was performed according to the departmental dose-optimization program which includes automated exposure control, adjustment of the mA and/or kV according to patient  size and/or use of iterative reconstruction technique. CONTRAST:  75mL OMNIPAQUE  IOHEXOL  350 MG/ML SOLN COMPARISON:  CT scan chest from 07/10/2023 and CT Angiography GI bleed from 12/31/2022. FINDINGS: CTA CHEST FINDINGS Cardiovascular: Evaluation of pulmonary embolism beyond the segmental branches is limited due to suboptimal contrast-enhancement. There is no embolism to the segmental pulmonary artery level. There is stable moderately enlarged cardiac size. No pericardial effusion. No aortic aneurysm. There are coronary artery calcifications, in keeping with coronary artery disease. There are also mild peripheral atherosclerotic vascular calcifications of thoracic aorta and its major branches. 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 There multiple thin venous collaterals in the right upper hemithorax, similar to the prior study. Mediastinum/Nodes: Visualized thyroid gland appears grossly unremarkable. No solid / cystic mediastinal masses. The esophagus is nondistended precluding optimal assessment. No axillary, mediastinal or hilar lymphadenopathy by size criteria. Lungs/Pleura: The central tracheo-bronchial tree is patent. There are segmental atelectatic changes in the bilateral lower lobes, right more than left. There are patchy areas of linear, plate-like atelectasis and/or scarring throughout bilateral lungs. There is mosaic attenuation of lungs, consistent with heterogeneous air trapping related to small airways disease. No mass or consolidation. No pleural effusion or pneumothorax. No suspicious lung nodules. Musculoskeletal: The visualized soft tissues of the chest wall are grossly unremarkable. No suspicious osseous lesions. There are mild to moderate multilevel degenerative changes in the visualized spine. Redemonstration of patient's known posterior spinal fusion as well as anterolisthesis of T6 over T7, unchanged. Review of the MIP images confirms the above findings. CT ABDOMEN and PELVIS FINDINGS Hepatobiliary: The liver is normal in size. Non-cirrhotic configuration. No suspicious mass. No intrahepatic or extrahepatic bile duct dilation. No calcified gallstones. Normal gallbladder wall thickness. No pericholecystic inflammatory changes. Pancreas: Unremarkable. No pancreatic ductal dilatation or surrounding inflammatory changes. Spleen: Within normal limits. No focal lesion. Adrenals/Urinary Tract: Adrenal glands are unremarkable. Redemonstration of small/atrophic bilateral kidneys, right more than left. There is a partially exophytic simple cyst arising from the left kidney upper pole measuring 1.1 x 1.4 cm. No nephroureterolithiasis on  either side. Bilateral extrarenal pelves noted. No hydroureteronephrosis on either side. Urinary bladder is decompressed secondary to a suprapubic catheter. Stomach/Bowel: No disproportionate dilation of the small or large bowel loops. No evidence of abnormal bowel wall thickening or inflammatory changes. The appendix was not visualized; however there is no acute inflammatory process in the right lower quadrant. There is moderate stool burden. Vascular/Lymphatic: No ascites or pneumoperitoneum. No abdominal or pelvic lymphadenopathy, by size criteria. No aneurysmal dilation of the major abdominal arteries. There are mild peripheral atherosclerotic vascular calcifications of the aorta and its major branches. Reproductive: Not well evaluated on this exam. Anteverted uterus noted with endometrium measuring up to 6-7 mm in thickness. No large adnexal mass seen. Correlate clinically to determine the need for additional imaging with pelvic ultrasound. Other: There is soft tissue defect in the posterior aspect of upper left thigh with soft tissue defect reaching up to the proximal left femur bone surface. There is small walled-off abscess/collection in the soft tissue as well measuring at approximately 2.0 x 3.3 cm. There is an additional approximately 1.6 x 7.2 cm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

## 2023-11-22 DIAGNOSIS — L89300 Pressure ulcer of unspecified buttock, unstageable: Secondary | ICD-10-CM

## 2023-11-22 DIAGNOSIS — N186 End stage renal disease: Secondary | ICD-10-CM | POA: Diagnosis not present

## 2023-11-22 DIAGNOSIS — I10 Essential (primary) hypertension: Secondary | ICD-10-CM | POA: Diagnosis not present

## 2023-11-22 DIAGNOSIS — G8220 Paraplegia, unspecified: Secondary | ICD-10-CM | POA: Diagnosis not present

## 2023-11-22 LAB — PHOSPHORUS: Phosphorus: 3.3 mg/dL (ref 2.5–4.6)

## 2023-11-22 LAB — HEPATITIS B SURFACE ANTIBODY, QUANTITATIVE: Hep B S AB Quant (Post): 380 m[IU]/mL

## 2023-11-22 MED ORDER — MELATONIN 3 MG PO TABS
6.0000 mg | ORAL_TABLET | Freq: Once | ORAL | Status: AC
  Administered 2023-11-22: 6 mg via ORAL
  Filled 2023-11-22: qty 2

## 2023-11-22 MED ORDER — METHYLPREDNISOLONE SODIUM SUCC 40 MG IJ SOLR
40.0000 mg | Freq: Two times a day (BID) | INTRAMUSCULAR | Status: DC
  Administered 2023-11-22 – 2023-11-24 (×4): 40 mg via INTRAVENOUS
  Filled 2023-11-22 (×4): qty 1

## 2023-11-22 NOTE — Plan of Care (Signed)
   Problem: Education: Goal: Knowledge of General Education information will improve Description: Including pain rating scale, medication(s)/side effects and non-pharmacologic comfort measures Outcome: Progressing   Problem: Clinical Measurements: Goal: Ability to maintain clinical measurements within normal limits will improve Outcome: Progressing   Problem: Nutrition: Goal: Adequate nutrition will be maintained Outcome: Progressing

## 2023-11-22 NOTE — Progress Notes (Signed)
 [facility]  Patient seen and examined.  She is resting comfortably in bed.  Her dressings have not been changed since yesterday.  She has no complaints.  Vitals:   11/22/23 0424 11/22/23 0922  BP: 119/66 (!) 154/79  Pulse: 87 80  Resp: 19 18  Temp: 98.5 F (36.9 C)   SpO2: 92% 100%   Exam: Left hip wound with no further purulent drainage noted, small amount of blood on previous packing, continued demarcation of skin or previous retained gauze was present; right hip wound 2 x 2 cm without any tunneling, mild fibrinous exudate overlying  A&P: Patient is a 58 year old female who was admitted with acute hypoxic respiratory failure and decubitus ulcers.  -Orthopedic surgery recommending multidisciplinary care at a tertiary care facility for chronic left hip wound -Continue daily wet-to-dry dressing changes to the wound with Kerlix.  Would make sure that the areas of tracking are also packed. -Orders placed for wet-to-dry dressing to right hip wound -Explained to patient that she may require debridement of the medial skin this wound, but will allow the area to further demarcate prior to intervention.  She can follow-up with her wound care specialist for further recommendations -IV antibiotics per hospitalist -Care per hospitalist -General Surgery to sign off.  Please call with any questions or concerns  Dorothyann Brittle, DO [facility] [address] [place], [place] [address] [phone] (office)

## 2023-11-22 NOTE — Progress Notes (Signed)
 Dr. Dorothyann Pappayliou at bedside.  Dressings changes done by MD and this RN assisted.    Patient refused CHG bath.  Patient educated on CHG bathing.  Patient states  I am allergic to those wipes, I only want a regular soap and water  bath.  This RN and NT gave patient a bath and changed her gown and bed linens.
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
## 2023-11-23 DIAGNOSIS — G8220 Paraplegia, unspecified: Secondary | ICD-10-CM | POA: Diagnosis not present

## 2023-11-23 DIAGNOSIS — N186 End stage renal disease: Secondary | ICD-10-CM | POA: Diagnosis not present

## 2023-11-23 DIAGNOSIS — L89300 Pressure ulcer of unspecified buttock, unstageable: Secondary | ICD-10-CM | POA: Diagnosis not present

## 2023-11-23 DIAGNOSIS — U071 COVID-19: Secondary | ICD-10-CM | POA: Diagnosis not present

## 2023-11-23 DIAGNOSIS — L02419 Cutaneous abscess of limb, unspecified: Secondary | ICD-10-CM

## 2023-11-23 NOTE — Plan of Care (Signed)
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
## 2023-11-23 NOTE — Plan of Care (Signed)
  Problem: Respiratory: Goal: Will maintain a patent airway Outcome: Progressing   Problem: Clinical Measurements: Goal: Ability to maintain clinical measurements within normal limits will improve Outcome: Progressing Goal: Will remain free from infection Outcome: Progressing Goal: Diagnostic test results will improve Outcome: Progressing

## 2023-11-24 DIAGNOSIS — G8220 Paraplegia, unspecified: Secondary | ICD-10-CM | POA: Diagnosis not present

## 2023-11-24 DIAGNOSIS — U071 COVID-19: Secondary | ICD-10-CM | POA: Diagnosis not present

## 2023-11-24 DIAGNOSIS — I10 Essential (primary) hypertension: Secondary | ICD-10-CM | POA: Diagnosis not present

## 2023-11-24 DIAGNOSIS — I5032 Chronic diastolic (congestive) heart failure: Secondary | ICD-10-CM | POA: Diagnosis present

## 2023-11-24 DIAGNOSIS — G40909 Epilepsy, unspecified, not intractable, without status epilepticus: Secondary | ICD-10-CM | POA: Diagnosis not present

## 2023-11-24 LAB — CBC
HCT: 23.6 % — ABNORMAL LOW (ref 36.0–46.0)
Hemoglobin: 7 g/dL — ABNORMAL LOW (ref 12.0–15.0)
MCH: 29.2 pg (ref 26.0–34.0)
MCHC: 29.7 g/dL — ABNORMAL LOW (ref 30.0–36.0)
MCV: 98.3 fL (ref 80.0–100.0)
Platelets: 396 10*3/uL (ref 150–400)
RBC: 2.4 MIL/uL — ABNORMAL LOW (ref 3.87–5.11)
RDW: 16.8 % — ABNORMAL HIGH (ref 11.5–15.5)
WBC: 12.2 10*3/uL — ABNORMAL HIGH (ref 4.0–10.5)
nRBC: 0 % (ref 0.0–0.2)

## 2023-11-24 LAB — RENAL FUNCTION PANEL
Albumin: 2.1 g/dL — ABNORMAL LOW (ref 3.5–5.0)
Anion gap: 13 (ref 5–15)
BUN: 64 mg/dL — ABNORMAL HIGH (ref 6–20)
CO2: 24 mmol/L (ref 22–32)
Calcium: 8.9 mg/dL (ref 8.9–10.3)
Chloride: 90 mmol/L — ABNORMAL LOW (ref 98–111)
Creatinine, Ser: 5.56 mg/dL — ABNORMAL HIGH (ref 0.44–1.00)
GFR, Estimated: 8 mL/min — ABNORMAL LOW (ref >60)
Glucose, Bld: 114 mg/dL — ABNORMAL HIGH (ref 70–99)
Phosphorus: 5.1 mg/dL — ABNORMAL HIGH (ref 2.5–4.6)
Potassium: 6 mmol/L — ABNORMAL HIGH (ref 3.5–5.1)
Sodium: 127 mmol/L — ABNORMAL LOW (ref 135–145)

## 2023-11-24 LAB — PREPARE RBC (CROSSMATCH)

## 2023-11-24 MED ORDER — SODIUM CHLORIDE 0.9% IV SOLUTION: Freq: Once | INTRAVENOUS | Status: AC

## 2023-11-24 MED ORDER — SENNOSIDES-DOCUSATE SODIUM 8.6-50 MG PO TABS: 2.0000 | ORAL_TABLET | Freq: Every day | ORAL | 3 refills | Status: AC

## 2023-11-24 MED ORDER — AMLODIPINE BESYLATE 10 MG PO TABS: 10.0000 mg | ORAL_TABLET | Freq: Every evening | ORAL | 3 refills | Status: DC

## 2023-11-24 MED ORDER — CYCLOBENZAPRINE HCL 5 MG PO TABS: 5.0000 mg | ORAL_TABLET | Freq: Two times a day (BID) | ORAL | 2 refills | Status: DC | PRN

## 2023-11-24 MED ORDER — CLONIDINE 0.1 MG/24HR TD PTWK: 0.1000 mg | MEDICATED_PATCH | TRANSDERMAL | 12 refills | Status: DC

## 2023-11-24 MED ORDER — PANTOPRAZOLE SODIUM 40 MG PO TBEC: 40.0000 mg | DELAYED_RELEASE_TABLET | Freq: Every day | ORAL | 0 refills | Status: DC

## 2023-11-24 MED ORDER — SODIUM ZIRCONIUM CYCLOSILICATE 10 G PO PACK: 10.0000 g | PACK | Freq: Once | ORAL | Status: DC

## 2023-11-24 MED ORDER — PREDNISONE 20 MG PO TABS: 40.0000 mg | ORAL_TABLET | Freq: Every day | ORAL | 0 refills | Status: AC

## 2023-11-24 MED ORDER — VANCOMYCIN HCL 500 MG/100ML IV SOLN: 500.0000 mg | INTRAVENOUS | 8 refills | Status: DC

## 2023-11-24 MED ORDER — DARBEPOETIN ALFA 150 MCG/0.3ML IJ SOSY
150.0000 ug | PREFILLED_SYRINGE | Freq: Once | INTRAMUSCULAR | Status: AC
  Administered 2023-11-24: 150 ug via SUBCUTANEOUS
  Filled 2023-11-24: qty 0.3

## 2023-11-24 MED ORDER — SANTYL 250 UNIT/GM EX OINT: 1.0000 | TOPICAL_OINTMENT | Freq: Every day | CUTANEOUS | 4 refills | Status: DC

## 2023-11-24 MED ORDER — LEVETIRACETAM 500 MG PO TABS: 1000.0000 mg | ORAL_TABLET | Freq: Every evening | ORAL | 3 refills | Status: DC

## 2023-11-24 MED ORDER — CARVEDILOL 12.5 MG PO TABS: 12.5000 mg | ORAL_TABLET | Freq: Two times a day (BID) | ORAL | 3 refills | Status: DC
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
## 2023-11-24 NOTE — Progress Notes (Signed)
   HEMODIALYSIS TREATMENT NOTE:  Uneventful 3.25 hour heparin -free treatment completed using left upper arm AVF (15g/antegrade). Goal met: 1.8 liters removed.  One unit pRBCs transfused.  Vancomycin  500mg  given during last hour.  All blood was returned.  Hemostasis was achieved in 15 minutes.  Post-HD:  11/24/23 2000  Vitals  Temp 98 F (36.7 C)  Temp Source Oral  BP (!) 152/80  MAP (mmHg) 99  BP Location Right Arm  BP Method Automatic  Patient Position (if appropriate) Lying  Pulse Rate 84  Pulse Rate Source Monitor  Resp 16  Weight 45.8 kg  Type of Weight Post-Dialysis  Oxygen Therapy  SpO2 96 %  O2 Device Room Air    Jon Laos, RN AP [facility]
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
## 2023-11-24 NOTE — Progress Notes (Signed)
 Patient ID: Montez Stryker, female   DOB: June 21, 1965, 58 y.o.   MRN: [id number] S: No events overnight. O:BP (!) 150/81 (BP Location: Right Arm)   Pulse 79   Temp 97.8 F (36.6 C) (Oral)   Resp 14   Ht 5' 2 (1.575 m)   Wt 45 kg   LMP  (LMP Unknown)   SpO2 99%   BMI 18.15 kg/m   Intake/Output Summary (Last 24 hours) at 11/24/2023 0751 Last data filed at 11/23/2023 1500 Gross per 24 hour  Intake 820 ml  Output --  Net 820 ml   Intake/Output: I/O last 3 completed shifts: In: 1060 [P.O.:960; IV Piggyback:100] Out: -   Intake/Output this shift:  No intake/output data recorded. Weight change:    Recent Labs  Lab 11/20/23 1250 11/21/23 0519 11/22/23 0522 11/24/23 0438  NA 135 131*  --  127*  K 3.2* 4.3  --  6.0*  CL 94* 96*  --  90*  CO2 27 23  --  24  GLUCOSE 104* 138*  --  114*  BUN 36* 52*  --  64*  CREATININE 3.77* 4.85*  --  5.56*  ALBUMIN 2.4*  --   --  2.1*  CALCIUM  9.1 8.3*  --  8.9  PHOS  --   --  3.3 5.1*  AST 20  --   --   --   ALT 17  --   --   --    Liver Function Tests: Recent Labs  Lab 11/20/23 1250 11/24/23 0438  AST 20  --   ALT 17  --   ALKPHOS 55  --   BILITOT 0.8  --   PROT 7.8  --   ALBUMIN 2.4* 2.1*   No results for input(s): LIPASE, AMYLASE in the last 168 hours. No results for input(s): AMMONIA in the last 168 hours. CBC: Recent Labs  Lab 11/20/23 1250 11/21/23 0519 11/24/23 0438  WBC 14.0* 11.2* 12.2*  NEUTROABS 11.9*  --   --   HGB 8.2* 7.0* 7.0*  HCT 28.7* 23.4* 23.6*  MCV 100.3* 98.7 98.3  PLT 374 302 396   Cardiac Enzymes: No results for input(s): CKTOTAL, CKMB, CKMBINDEX, TROPONINI in the last 168 hours. CBG: No results for input(s): GLUCAP in the last 168 hours.  Iron  Studies: No results for input(s): IRON , TIBC, TRANSFERRIN, FERRITIN in the last 72 hours. Studies/Results: No results found.  amLODipine   10 mg Oral QPM   calcitRIOL   1.5 mcg Oral Q M,W,F   carvedilol   12.5 mg Oral BID  WC   Chlorhexidine  Gluconate Cloth  6 each Topical Daily   cinacalcet   30 mg Oral Q M,W,F   feeding supplement  237 mL Oral TID BM   gabapentin   100 mg Oral BID   irbesartan   150 mg Oral Daily   levETIRAcetam   1,000 mg Oral QPM   levETIRAcetam   500 mg Oral Q M,W,F-HD   lidocaine -prilocaine   1 Application Topical Once per day on Monday Wednesday Friday   methylPREDNISolone  (SOLU-MEDROL ) injection  40 mg Intravenous Q12H   multivitamin  1 tablet Oral Daily   pantoprazole   40 mg Oral Daily   senna-docusate  2 tablet Oral QHS   sevelamer  carbonate  800 mg Oral TID    BMET    Component Value Date/Time   NA 127 (L) 11/24/2023 0438   NA 137 06/14/2014 1403   K 6.0 (H) 11/24/2023 0438   CL 90 (L) 11/24/2023 0438   CO2 24  11/24/2023 0438   GLUCOSE 114 (H) 11/24/2023 0438   BUN 64 (H) 11/24/2023 0438   BUN 10 06/14/2014 1403   CREATININE 5.56 (H) 11/24/2023 0438   CREATININE 0.76 04/18/2015 1424   CALCIUM  8.9 11/24/2023 0438   GFRNONAA 8 (L) 11/24/2023 0438   GFRAA 11 (L) 11/20/2017 1015   CBC    Component Value Date/Time   WBC 12.2 (H) 11/24/2023 0438   RBC 2.40 (L) 11/24/2023 0438   HGB 7.0 (L) 11/24/2023 0438   HCT 23.6 (L) 11/24/2023 0438   PLT 396 11/24/2023 0438   MCV 98.3 11/24/2023 0438   MCH 29.2 11/24/2023 0438   MCHC 29.7 (L) 11/24/2023 0438   RDW 16.8 (H) 11/24/2023 0438   RDW 15.0 06/14/2014 1403   LYMPHSABS 1.0 11/20/2023 1250   MONOABS 0.7 11/20/2023 1250   EOSABS 0.2 11/20/2023 1250   BASOSABS 0.1 11/20/2023 1250   OP HD: [facility] MWF   3h  400/500   44kg   3K bath  Heparin  none  LUA AVF 2/2 , no heparin  Noncompliant history but better recently Calcitriol  1.5 mcg each tx; Sensipar  30mg  TIW Mircera 90 mcg every 2 weeks - due on 6/30 Venofer 50mg  qweekly (already received this week)  Assessment/Plan:  Acute hypoxic respiratory failure due to Covid-19 - CTA of chest negative for pulmonary embolism of other lung findings.  Treatment of  covid-19 per primary svc.  Remains on oxygen and treated with IV solumedrol.  Possible discharge to home on oxygen ESRD - continue with HD on MWF schedule.  Sacral decubitus ulcer - CT of abd/pelvis revealed abscess/collection in the soft tissue.  Surgery evaluated and removed retained guaze, no definitive abscess.  Possible osteo and is on IV vancomycin  with HD.  Continue with dressing changes Anemia of ESRD - Hgb down to 7.  Transfuse and continue with ESA.  Will dose today at 150 mcg.  Follow up as an outpatient. CKD-BMD -  continue with binders.  Phos 5.1, Ca 8.9 Hyperkalemia - will use low K bath today and follow.  DM - per primary svc Disposition - hopeful discharge to home after HD today.  [name], MD [facility]

## 2023-11-24 NOTE — Care Management Important Message (Signed)
 Important Message  Patient Details  Name: [name] MRN: [id number] Date of Birth: 17-Jan-1966   Important Message Given:  N/A - LOS <3 / Initial given by admissions     Sabir Charters L Meron Bocchino 11/24/2023, 9:55 AM

## 2023-11-24 NOTE — TOC Transition Note (Signed)
 Transition of Care [facility]) - Discharge Note   Patient Details  Name: [name] MRN: [id number] Date of Birth: 05-27-1966  Transition of Care [facility]) CM/SW Contact:  Mcarthur Saddie Kim, LCSW Phone Number: 11/24/2023, 4:07 PM   Clinical Narrative: Pt d/c today. Pt will require home O2. LCSW discussed with pt who has no preference on agency. Referred to Dolanda with [employer]. Dolanda aware of d/c today. Orders and sat qual note in. MD ordered HHRN/aide. Pt requested [facility]. Per Darice with [facility], unable to accept. LCSW reached out to [facility], [facility], [facility], [facility], and [facility] as well and all declined. Pt aware and will continue having daughter do wound care at home. MD notified. D/C summary sent to [facility] as pt will need IV vancomycin  at HD. LCSW confirmed received.     Final next level of care: Home/Self Care Barriers to Discharge: Barriers Resolved   Patient Goals and CMS Choice Patient states their goals for this hospitalization and ongoing recovery are:: return home   Choice offered to / list presented to : Patient [place] ownership interest in [facility]provided to::  (n/a)    Discharge Placement                    Patient and family notified of of transfer: 11/24/23  Discharge Plan and Services Additional resources added to the After Visit Summary for                  DME Arranged: Oxygen DME Agency: [facility] Date DME Agency Contacted: 11/24/23 Time DME Agency Contacted: [id number] Representative spoke with at DME Agency: Dolanda            Social Drivers of Health (SDOH) Interventions SDOH Screenings   Food Insecurity: No Food Insecurity (11/20/2023)  Housing: Low Risk  (11/20/2023)  Transportation Needs: No Transportation Needs (11/20/2023)  Utilities: Not At Risk (11/20/2023)  Depression (PHQ2-9): Low Risk  (11/20/2023)  Recent Concern: Depression (PHQ2-9) - High Risk (10/07/2023)  Tobacco Use: Low Risk  (11/20/2023)      Readmission Risk Interventions    09/22/2023    8:17 PM 07/11/2023    8:38 AM 06/30/2023   10:47 AM  Readmission Risk Prevention Plan  Transportation Screening Complete Complete Complete  Medication Review [employer]) Complete Complete Complete  PCP or Specialist appointment within 3-5 days of discharge Complete    HRI or Home Care Consult Complete Complete Complete  SW Recovery Care/Counseling Consult Complete Complete Complete  Palliative Care Screening Not Applicable Not Applicable Not Applicable  Skilled Nursing Facility Not Applicable Not Applicable Not Applicable

## 2023-11-24 NOTE — Plan of Care (Signed)
  Problem: Education: Goal: Knowledge of risk factors and measures for prevention of condition will improve Outcome: Progressing   Problem: Clinical Measurements: Goal: Diagnostic test results will improve Outcome: Progressing

## 2023-11-24 NOTE — Discharge Summary (Signed)
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 atherosclerotic vascular calcifications of thoracic aorta and its major branches. 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 There multiple thin venous collaterals in the right upper hemithorax, similar to the prior study. Mediastinum/Nodes: Visualized thyroid gland appears grossly unremarkable. No solid / cystic mediastinal masses. The esophagus is nondistended precluding optimal assessment. No axillary, mediastinal or hilar lymphadenopathy by size criteria. Lungs/Pleura: The central tracheo-bronchial tree is patent. There are segmental atelectatic changes in the bilateral lower lobes, right more than  left. There are patchy areas of linear, plate-like atelectasis and/or scarring throughout bilateral lungs. There is mosaic attenuation of lungs, consistent with heterogeneous air trapping  related to small airways disease. No mass or consolidation. No pleural effusion or pneumothorax. No suspicious lung nodules. Musculoskeletal: The visualized soft tissues of the chest wall are grossly unremarkable. No suspicious osseous lesions. There are mild to moderate multilevel degenerative changes in the visualized spine. Redemonstration of patient's known posterior spinal fusion as well as anterolisthesis of T6 over T7, unchanged. Review of the MIP images confirms the above findings. CT ABDOMEN and PELVIS FINDINGS Hepatobiliary: The liver is normal in size. Non-cirrhotic configuration. No suspicious mass. No intrahepatic or extrahepatic bile duct dilation. No calcified gallstones. Normal gallbladder wall thickness. No pericholecystic inflammatory changes. Pancreas: Unremarkable. No pancreatic ductal dilatation or surrounding inflammatory changes. Spleen: Within normal limits. No focal lesion. Adrenals/Urinary Tract: Adrenal glands are unremarkable. Redemonstration of small/atrophic bilateral kidneys, right more than left. There is a partially exophytic simple cyst arising from the left kidney upper pole measuring 1.1 x 1.4 cm. No nephroureterolithiasis on either side. Bilateral extrarenal pelves noted. No hydroureteronephrosis on either side. Urinary bladder is decompressed secondary to a suprapubic catheter. Stomach/Bowel: No disproportionate dilation of the small or large bowel loops. No evidence of abnormal bowel wall thickening or inflammatory changes. The appendix was not visualized; however there is no acute inflammatory process in the right lower quadrant. There is moderate stool burden. Vascular/Lymphatic: No ascites or pneumoperitoneum. No abdominal or pelvic lymphadenopathy, by size criteria. No aneurysmal dilation of the major abdominal arteries. There are mild peripheral atherosclerotic vascular calcifications of the aorta and its major branches. Reproductive: Not well evaluated on this exam.  Anteverted uterus noted with endometrium measuring up to 6-7 mm in thickness. No large adnexal mass seen. Correlate clinically to determine the need for additional imaging with pelvic ultrasound. Other: There is soft tissue defect in the posterior aspect of upper left thigh with soft tissue defect reaching up to the proximal left femur bone surface. There is small walled-off abscess/collection in the soft tissue as well measuring at approximately 2.0 x 3.3 cm. There is an additional approximately 1.6 x 7.2 cm soft tissue mass with extensive internal dystrophic calcifications in the midline upper gluteal region, which is present since the prior study. There is small to moderate right hip joint effusion with slightly hyperattenuating walls, raising the concern for chronic joint effusion versus septic joint. Correlate clinically. Musculoskeletal: No suspicious osseous lesions. There are mild multilevel degenerative changes in the visualized spine. Review of the MIP images confirms the above findings. IMPRESSION: 1. No embolism to the segmental pulmonary artery level. 2. There is a decubitus ulcer in the posterior aspect of the upper left thigh reaching up to the bone surface. There is associated small walled-off abscess/collection in the soft tissue measuring up to 2.0 x 3.3 cm. 3. There is a small to moderate right hip joint effusion with slightly hyperattenuating walls, raising the concern for chronic joint effusion versus septic joint. Correlate clinically. 4. Multiple other nonacute observations, as described above. Electronically Signed   By: Ree Molt M.D.   On: 11/20/2023 14:49   DG Chest Port 1 View Result Date: 11/20/2023 CLINICAL DATA:  hypoxia EXAM: PORTABLE CHEST - 1 VIEW COMPARISON:  Sep 28, 2023 FINDINGS: Lower lung volumes. No focal airspace consolidation or pneumothorax. Trace right pleural effusion, unchanged. Mild cardiomegaly.No acute fracture or destructive lesion. Bilateral thoracic  Harrington fusion rods hardware again noted. IMPRESSION: Unchanged  trace right pleural effusion. Otherwise, no acute cardiopulmonary abnormality. Electronically Signed   By: Rogelia Myers M.D.   On: 11/20/2023 13:17    Micro Results  Recent Results (from the past 240 hours)  Culture, blood (routine x 2)     Status: None (Preliminary result)   Collection Time: 11/20/23 12:50 PM   Specimen: BLOOD  Result Value Ref Range Status   Specimen Description   Final    BLOOD RIGHT ANTECUBITAL Performed at [facility], [address] [place], [place] [address]    Special Requests   Final    BOTTLES DRAWN AEROBIC AND ANAEROBIC Blood Culture adequate volume Performed at [facility], [address] [place], [place] [address]    Culture   Final    NO GROWTH 4 DAYS Performed at [facility], [address] [place], [place] [address]    Report Status PENDING  Incomplete  Resp panel by RT-PCR (RSV, Flu A&B, Covid) Anterior Nasal Swab     Status: Abnormal   Collection Time: 11/20/23 12:55 PM   Specimen: Anterior Nasal Swab  Result Value Ref Range Status   SARS Coronavirus 2 by RT PCR POSITIVE (A) NEGATIVE Final    Comment: (NOTE) SARS-CoV-2 target nucleic acids are DETECTED.  The SARS-CoV-2 RNA is generally detectable in upper respiratory specimens during the acute phase of infection. Positive results are indicative of the presence of the identified virus, but do not rule out bacterial infection or co-infection with other pathogens not detected by the test. Clinical correlation with patient history and other diagnostic information is necessary to determine patient infection status. The expected result is Negative.  Fact Sheet for Patients: BloggerCourse.com  Fact Sheet for Healthcare Providers: SeriousBroker.it  This test is not yet approved or cleared by the [place]  FDA and  has been authorized for detection and/or diagnosis  of SARS-CoV-2 by FDA under an Emergency Use Authorization (EUA).  This EUA will remain in effect (meaning this test can be used) for the duration of  the COVID-19 declaration under Section 564(b)(1) of the A ct, 21 U.S.C. section 360bbb-3(b)(1), unless the authorization is terminated or revoked sooner.     Influenza A by PCR NEGATIVE NEGATIVE Final   Influenza B by PCR NEGATIVE NEGATIVE Final    Comment: (NOTE) The Xpert Xpress SARS-CoV-2/FLU/RSV plus assay is intended as an aid in the diagnosis of influenza from Nasopharyngeal swab specimens and should not be used as a sole basis for treatment. Nasal washings and aspirates are unacceptable for Xpert Xpress SARS-CoV-2/FLU/RSV testing.  Fact Sheet for Patients: BloggerCourse.com  Fact Sheet for Healthcare Providers: SeriousBroker.it  This test is not yet approved or cleared by the [place]  FDA and has been authorized for detection and/or diagnosis of SARS-CoV-2 by FDA under an Emergency Use Authorization (EUA). This EUA will remain in effect (meaning this test can be used) for the duration of the COVID-19 declaration under Section 564(b)(1) of the Act, 21 U.S.C. section 360bbb-3(b)(1), unless the authorization is terminated or revoked.     Resp Syncytial Virus by PCR NEGATIVE NEGATIVE Final    Comment: (NOTE) Fact Sheet for Patients: BloggerCourse.com  Fact Sheet for Healthcare Providers: SeriousBroker.it  This test is not yet approved or cleared by the [place]  FDA and has been authorized for detection and/or diagnosis of SARS-CoV-2 by FDA under an Emergency Use Authorization (EUA). This EUA will remain in effect (meaning this test can be used) for the duration of the COVID-19 declaration under Section  564(b)(1) of the Act, 21 U.S.C. section 360bbb-3(b)(1), unless the authorization is terminated  or revoked.  Performed at [facility], [address] [place], [place] [address]   Culture, blood (routine x 2)     Status: None (Preliminary result)   Collection Time: 11/20/23  1:01 PM   Specimen: BLOOD  Result Value Ref Range Status   Specimen Description   Final    BLOOD BLOOD RIGHT HAND Performed at [facility], [address] [place], [place] [address]    Special Requests   Final    BOTTLES DRAWN AEROBIC ONLY Blood Culture results may not be optimal due to an inadequate volume of blood received in culture bottles Performed at [facility], [address] [place], [place] [address]    Culture   Final    NO GROWTH 4 DAYS Performed at [facility], [address] [place], [place] [address]    Report Status PENDING  Incomplete  MRSA Next Gen by PCR, Nasal     Status: None   Collection Time: 11/21/23  3:39 AM   Specimen: Nasal Mucosa; Nasal Swab  Result Value Ref Range Status   MRSA by PCR Next Gen NOT DETECTED NOT DETECTED Final    Comment: (NOTE) The GeneXpert MRSA Assay (FDA approved for NASAL specimens only), is one component of a comprehensive MRSA colonization surveillance program. It is not intended to diagnose MRSA infection nor to guide or monitor treatment for MRSA infections. Test performance is not FDA approved in patients less than 74 years old. Performed at [facility], [address], [place], [place] [address]     Today   Subjective    [name] today has no new complaints No fever  Or chills   No Nausea, Vomiting or Diarrhea          Patient has been seen and examined prior to discharge   Objective   Blood pressure 137/78, pulse 77, temperature 97.8 F (36.6 C), temperature source Oral, resp. rate 16, height 5' 2 (1.575 m), weight 45 kg, SpO2 100%.   Intake/Output Summary (Last 24 hours) at 11/24/2023 1545 Last data filed at 11/24/2023 1300 Gross per 24 hour  Intake 360 ml  Output --  Net 360 ml    Exam Gen:- Awake  Alert, no acute distress HEENT:- [email or web address].AT, No sclera icterus Nose- [place] 2L/min Neck-Supple Neck,No JVD,.  Lungs-improved air movement, no wheezing CV- S1, S2 normal, regular  Abd-  +ve B.Sounds, Abd Soft, No tenderness, suprapubic catheter noted psych-affect is appropriate, oriented x3 MSK-left upper extremity AV fistula with positive thrill and bruit Neuro: Paraplegic, alert, oriented Ext: +Contracted LE's and +T/B LUA AVF Skin: Bilateral ischial wounds and midline sacral wound    Data Review   CBC w Diff:  Lab Results  Component Value Date   WBC 12.2 (H) 11/24/2023   HGB 7.0 (L) 11/24/2023   HCT 23.6 (L) 11/24/2023   PLT 396 11/24/2023   LYMPHOPCT 7 11/20/2023   BANDSPCT 5 07/10/2023   MONOPCT 5 11/20/2023   EOSPCT 1 11/20/2023   BASOPCT 0 11/20/2023   CMP:  Lab Results  Component Value Date   NA 127 (L) 11/24/2023   NA 137 06/14/2014   K 6.0 (H) 11/24/2023   CL 90 (L) 11/24/2023   CO2 24 11/24/2023   BUN 64 (H) 11/24/2023   BUN 10 06/14/2014   CREATININE 5.56 (H) 11/24/2023   CREATININE 0.76 04/18/2015   PROT 7.8 11/20/2023   PROT 5.4 (  L) 06/14/2014   ALBUMIN 2.1 (L) 11/24/2023   ALBUMIN 2.4 (L) 06/14/2014   BILITOT 0.8 11/20/2023   ALKPHOS 55 11/20/2023   AST 20 11/20/2023   ALT 17 11/20/2023  .  Total Discharge time is about 33 minutes  Rendall Carwin M.D on 11/24/2023 at 3:45 PM  Go to [email or web address] -  for contact info  [facility] - Office  [phone]

## 2023-11-25 ENCOUNTER — Telehealth: Payer: Self-pay

## 2023-11-25 LAB — TYPE AND SCREEN
ABO/RH(D): A POS
Antibody Screen: NEGATIVE
Unit division: 0

## 2023-11-25 LAB — CULTURE, BLOOD (ROUTINE X 2)
Culture: NO GROWTH
Culture: NO GROWTH
Special Requests: ADEQUATE

## 2023-11-25 LAB — BPAM RBC
Blood Product Expiration Date: 202507272359
ISSUE DATE / TIME: 202506301748
Unit Type and Rh: 6200

## 2023-11-25 NOTE — Transitions of Care (Post Inpatient/ED Visit) (Signed)
   11/25/2023  Name: [name] MRN: [id number] DOB: December 17, 1965  Today's TOC FU Call Status: Today's TOC FU Call Status:: Unsuccessful Call (1st Attempt) Unsuccessful Call (1st Attempt) Date: 11/25/23  Attempted to reach the patient regarding the most recent Inpatient/ED visit.  Follow Up Plan: Additional outreach attempts will be made to reach the patient to complete the Transitions of Care (Post Inpatient/ED visit) call.   Keinan Brouillet J. Antwain Caliendo RN, MSN [facility], [facility] RN Care Manager Direct Dial: [phone]  Fax: [phone] Website: [email or web address]

## 2023-12-02 ENCOUNTER — Telehealth: Payer: Self-pay

## 2023-12-02 NOTE — Transitions of Care (Post Inpatient/ED Visit) (Signed)
   12/02/2023  Name: [name] MRN: [id number] DOB: September 17, 1965  Today's TOC FU Call Status: Today's TOC FU Call Status:: Successful TOC FU Call Completed TOC FU Call Complete Date: 12/02/23 Patient's Name and Date of Birth confirmed.  Transition Care Management Follow-up Telephone Call How have you been since you were released from the hospital?: Better Any questions or concerns?: No  Items Reviewed: Did you receive and understand the discharge instructions provided?: Yes (Offered to review discharge instructions and patient reports she understands her instructions and denies needing any assistance, call ended.)  Placed call to patient and explained reason for call. Patient declined needs and call ended.  Alan Ee, RN, BSN, CEN [employer]- Transition of Care Team.  [facility] [phone]

## 2023-12-04 ENCOUNTER — Ambulatory Visit (INDEPENDENT_AMBULATORY_CARE_PROVIDER_SITE_OTHER): Admitting: Family Medicine

## 2023-12-04 ENCOUNTER — Encounter: Payer: Self-pay | Admitting: Family Medicine

## 2023-12-04 VITALS — BP 125/68 | HR 91 | Temp 99.0°F | Ht 62.0 in | Wt 100.0 lb

## 2023-12-04 DIAGNOSIS — E038 Other specified hypothyroidism: Secondary | ICD-10-CM

## 2023-12-04 DIAGNOSIS — E559 Vitamin D deficiency, unspecified: Secondary | ICD-10-CM | POA: Diagnosis not present

## 2023-12-04 DIAGNOSIS — R7301 Impaired fasting glucose: Secondary | ICD-10-CM

## 2023-12-04 DIAGNOSIS — G8929 Other chronic pain: Secondary | ICD-10-CM

## 2023-12-04 DIAGNOSIS — I10 Essential (primary) hypertension: Secondary | ICD-10-CM

## 2023-12-04 DIAGNOSIS — E7849 Other hyperlipidemia: Secondary | ICD-10-CM

## 2023-12-04 NOTE — Progress Notes (Signed)
 Established Patient Office Visit  Subjective:  Patient ID: Heiley Shaikh, female    DOB: 1965-09-13  Age: 58 y.o. MRN: [id number]  CC:  Chief Complaint  Patient presents with   Follow-up    4 month follow up hypertensive , pt stated that she has no concerns     HPI Lila Zyasia Halbleib is a 58 y.o. female with past medical history of  essential hypertension, chronic pain  presents for f/u of  chronic medical conditions. For the details of today's visit, please refer to the assessment and plan.     Past Medical History:  Diagnosis Date   Abnormal uterine bleeding (AUB) 06/15/2014   Cancer (HCC)    uterine   ESRD on hemodialysis (HCC)    High blood pressure    Paraplegia (lower)    Seizure disorder (HCC)    Seizures (HCC)    Suprapubic catheter (HCC)    Urinary tract infection     Past Surgical History:  Procedure Laterality Date   APPLICATION OF WOUND VAC Right 09/13/2021   (approximately 1-26mos ago) pressure sore on right hip   BACK SURGERY     Pt stated before 2000   BIOPSY  12/03/2022   Procedure: BIOPSY;  Surgeon: [name], MD;  [facility]: [facility];  Service: Gastroenterology;;   ESOPHAGOGASTRODUODENOSCOPY N/A 09/20/2015   Procedure: ESOPHAGOGASTRODUODENOSCOPY (EGD);  Surgeon: [name], MD;  Location: [facility];  Service: Endoscopy;  Laterality: N/A;  730   ESOPHAGOGASTRODUODENOSCOPY (EGD) WITH PROPOFOL  N/A 12/03/2022   Procedure: ESOPHAGOGASTRODUODENOSCOPY (EGD) WITH PROPOFOL ;  Surgeon: [name], MD;  Location: [facility];  Service: Gastroenterology;  Laterality: N/A;  1:15 pm, asa 3, pt knows to arrive at 10:30  dialysis pt, M,W & F   IR CATHETER TUBE CHANGE  04/02/2018   PERCUTANEOUS ENDOSCOPIC GASTROSTOMY (PEG) REMOVAL N/A 09/20/2015   Procedure: PERCUTANEOUS ENDOSCOPIC GASTROSTOMY (PEG) REMOVAL;  Surgeon: [name], MD;  Location: [facility];  Service: Endoscopy;  Laterality: N/A;   TEE WITHOUT  CARDIOVERSION N/A 11/11/2022   Procedure: TRANSESOPHAGEAL ECHOCARDIOGRAM (TEE);  Surgeon: [name], MD;  Location: [facility];  Service: Cardiovascular;  Laterality: N/A;    Family History  Problem Relation Age of Onset   Cancer Mother    Hypertension Mother    Cancer Sister        breast and then spread everywhere.   Diabetes Paternal Grandmother    Hypertension Paternal Grandmother     Social History   Socioeconomic History   Marital status: Single    Spouse name: Not on file   Number of children: 3   Years of education: 9 th   Highest education level: Not on file  Occupational History    Comment: Disabled  Tobacco Use   Smoking status: Never   Smokeless tobacco: Never  Vaping Use   Vaping status: Never Used  Substance and Sexual Activity   Alcohol use: Yes    Comment: Occassionally   Drug use: No   Sexual activity: Never  Other Topics Concern   Not on file  Social History Narrative   Patient lives with her son Selena). Patient is disabled.   Education 9th grade.   Right handed.   Caffeine - None    Social Drivers of Health   Financial Resource Strain: Not on file  Food Insecurity: No Food Insecurity (11/20/2023)   Hunger Vital Sign    Worried About Running Out of Food in the  Last Year: Never true    Ran Out of Food in the Last Year: Never true  Transportation Needs: No Transportation Needs (11/20/2023)   PRAPARE - Administrator, Civil Service (Medical): No    Lack of Transportation (Non-Medical): No  Physical Activity: Not on file  Stress: Not on file  Social Connections: Not on file  Intimate Partner Violence: Not At Risk (11/20/2023)   Humiliation, Afraid, Rape, and Kick questionnaire    Fear of Current or Ex-Partner: No    Emotionally Abused: No    Physically Abused: No    Sexually Abused: No    Outpatient Medications Prior to Visit  Medication Sig Dispense Refill   acetaminophen  (TYLENOL ) 500 MG tablet Take 1,000 mg by mouth every 6  (six) hours as needed for moderate pain.     albuterol  (PROVENTIL ) (2.5 MG/3ML) 0.083% nebulizer solution Take 3 mLs (2.5 mg total) by nebulization every 4 (four) hours as needed for wheezing or shortness of breath. 75 mL 2   amLODipine  (NORVASC ) 10 MG tablet Take 1 tablet (10 mg total) by mouth every evening. Take in the evenings -- 30 tablet 3   carvedilol  (COREG ) 12.5 MG tablet Take 1 tablet (12.5 mg total) by mouth 2 (two) times daily with a meal. 180 tablet 3   cloNIDine  (CATAPRES  - DOSED IN MG/24 HR) 0.1 mg/24hr patch Place 1 patch (0.1 mg total) onto the skin once a week. 4 patch 12   collagenase  (SANTYL ) 250 UNIT/GM ointment Apply 1 Application topically daily. 30 g 4   cyclobenzaprine  (FLEXERIL ) 5 MG tablet Take 1 tablet (5 mg total) by mouth 2 (two) times daily as needed for muscle spasms. 30 tablet 2   feeding supplement (ENSURE ENLIVE / ENSURE PLUS) LIQD Take 237 mLs by mouth 2 (two) times daily between meals. (Patient taking differently: Take 237 mLs by mouth 3 (three) times daily between meals.) 14220 mL 2   furosemide  (LASIX ) 80 MG tablet Take 1 tablet (80 mg total) by mouth every Tuesday, Thursday, Saturday, and Sunday. 45 tablet 3   gabapentin  (NEURONTIN ) 100 MG capsule Take 1 capsule (100 mg total) by mouth 2 (two) times daily. 60 capsule 0   irbesartan  (AVAPRO ) 150 MG tablet Take 1 tablet (150 mg total) by mouth daily. Take 150 mg by mouth daily. 90 tablet 1   levETIRAcetam  (KEPPRA ) 500 MG tablet Take 2 tablets (1,000 mg total) by mouth every evening. Take an additional tablet on Monday,Wednesday and Friday evening after Dialysis 90 tablet 3   lidocaine -prilocaine  (EMLA ) cream Apply 1 Application topically 3 (three) times a week.     multivitamin (RENA-VIT) TABS tablet Take 1 tablet by mouth daily.     pantoprazole  (PROTONIX ) 40 MG tablet Take 1 tablet (40 mg total) by mouth daily. 30 tablet 0   senna-docusate (SENOKOT-S) 8.6-50 MG tablet Take 2 tablets by mouth at bedtime. 60  tablet 3   sevelamer  carbonate (RENVELA ) 800 MG tablet Take 800 mg by mouth 3 (three) times daily.     vancomycin  (VANCOREADY) 500 MG/100ML IVPB Inject 100 mLs (500 mg total) into the vein every Monday, Wednesday, and Friday with hemodialysis. -iv Vancomycin  500 mg on MWF with Hemodialysis for 4 weeks --(Last Dose 12/24/23) 4000 mL 8   No facility-administered medications prior to visit.    Allergies  Allergen Reactions   Cefepime  Other (See Comments)    Severe AMS 09/2023 admission   Linezolid Other (See Comments)    Patient self-discontinued treatment due  to GI intolerance. Taking it along with moxifloxacin   Moxifloxacin Other (See Comments)    Patient self-discontinued treatment due to GI intolerance. Taking it along with linezolid   Quinine Derivatives Other (See Comments)    Alters mental status   Vancomycin  Other (See Comments)    Unknown  Pt is tolerating this medication at HD   Azithromycin  Itching and Rash   Benadryl [Diphenhydramine Hcl (Sleep)] Hives   Daptomycin Hives   Tetracycline Itching    Able to tolerate Doxycycline .    Zosyn  [Piperacillin  Sod-Tazobactam So] Rash    ROS Review of Systems  Constitutional:  Negative for chills and fever.  Eyes:  Negative for visual disturbance.  Respiratory:  Negative for chest tightness and shortness of breath.   Neurological:  Negative for dizziness and headaches.      Objective:    Physical Exam HENT:     Head: Normocephalic.     Mouth/Throat:     Mouth: Mucous membranes are moist.  Cardiovascular:     Rate and Rhythm: Normal rate.     Heart sounds: Normal heart sounds.  Pulmonary:     Effort: Pulmonary effort is normal.     Breath sounds: Normal breath sounds.  Neurological:     Mental Status: She is alert.     BP 125/68   Pulse 91   Temp 99 F (37.2 C)   Ht 5' 2 (1.575 m)   Wt 100 lb (45.4 kg)   LMP  (LMP Unknown)   BMI 18.29 kg/m  Wt Readings from Last 3 Encounters:  12/04/23 100 lb (45.4 kg)   11/24/23 100 lb 15.5 oz (45.8 kg)  11/20/23 95 lb (43.1 kg)    Lab Results  Component Value Date   TSH 2.393 09/26/2023   Lab Results  Component Value Date   WBC 12.2 (H) 11/24/2023   HGB 7.0 (L) 11/24/2023   HCT 23.6 (L) 11/24/2023   MCV 98.3 11/24/2023   PLT 396 11/24/2023   Lab Results  Component Value Date   NA 127 (L) 11/24/2023   K 6.0 (H) 11/24/2023   CO2 24 11/24/2023   GLUCOSE 114 (H) 11/24/2023   BUN 64 (H) 11/24/2023   CREATININE 5.56 (H) 11/24/2023   BILITOT 0.8 11/20/2023   ALKPHOS 55 11/20/2023   AST 20 11/20/2023   ALT 17 11/20/2023   PROT 7.8 11/20/2023   ALBUMIN 2.1 (L) 11/24/2023   CALCIUM  8.9 11/24/2023   ANIONGAP 13 11/24/2023   Lab Results  Component Value Date   CHOL 384 (H) 05/02/2014   Lab Results  Component Value Date   HDL 42 05/02/2014   Lab Results  Component Value Date   LDLCALC UNABLE TO CALCULATE IF TRIGLYCERIDE OVER 400 mg/dL 87/92/7984   Lab Results  Component Value Date   TRIG 412 (H) 05/02/2014   Lab Results  Component Value Date   CHOLHDL 9.1 05/02/2014   No results found for: HGBA1C    Assessment & Plan:  Essential hypertension Assessment & Plan: Continue current treatment regimen as is   Other chronic pain Assessment & Plan: Referral placed  Orders: -     Ambulatory referral to Pain Clinic  IFG (impaired fasting glucose) -     Hemoglobin A1c  Vitamin D deficiency -     VITAMIN D 25 Hydroxy (Vit-D Deficiency, Fractures)  TSH (thyroid-stimulating hormone deficiency) -     TSH + free T4  Other hyperlipidemia -     Lipid panel -  CMP14+EGFR -     CBC with Differential/Platelet  Note: This chart has been completed using Engineer, civil (consulting) software, and while attempts have been made to ensure accuracy, certain words and phrases may not be transcribed as intended.    Follow-up: No follow-ups on file.   Shantia Sanford, FNP

## 2023-12-04 NOTE — Assessment & Plan Note (Signed)
 Referral placed.

## 2023-12-04 NOTE — Assessment & Plan Note (Signed)
 Continue current treatment regimen as is

## 2023-12-04 NOTE — Patient Instructions (Addendum)
 I appreciate the opportunity to provide care to you today!    Follow up:  4 months  Labs: please stop by the  during the week to get your blood drawn (CBC, CMP, TSH, Lipid profile, HgA1c, Vit D)  For a Healthier YOU, I Recommend: Reducing your intake of sugar, sodium, carbohydrates, and saturated fats. Increasing your fiber intake by incorporating more whole grains, fruits, and vegetables into your meals. Setting healthy goals with a focus on lowering your consumption of carbs, sugar, and unhealthy fats. Adding variety to your diet by including a wide range of fruits and vegetables. Cutting back on soda and limiting processed foods as much as possible. Staying active: In addition to taking your weight loss medication, aim for at least 150 minutes of moderate-intensity physical activity each week for optimal results.    Please follow up if your symptoms worsen or fail to improve.  .   Please continue to a heart-healthy diet and increase your physical activities. Try to exercise for at least five days a week.    It was a pleasure to see you and I look forward to continuing to work together on your health and well-being. Please do not hesitate to call the office if you need care or have questions about your care.  In case of emergency, please visit the Emergency Department for urgent care, or contact our clinic at [phone] to schedule an appointment. We're here to help you!   Have a wonderful day and week. With Gratitude, Noorah Giammona MSN, FNP-BC

## 2023-12-05 ENCOUNTER — Ambulatory Visit: Payer: Self-pay | Admitting: Family Medicine

## 2023-12-05 DIAGNOSIS — E038 Other specified hypothyroidism: Secondary | ICD-10-CM

## 2023-12-05 LAB — CBC WITH DIFFERENTIAL/PLATELET
Basophils Absolute: 0 x10E3/uL (ref 0.0–0.2)
Basos: 0 %
EOS (ABSOLUTE): 0 x10E3/uL (ref 0.0–0.4)
Eos: 0 %
Hematocrit: 27.5 % — ABNORMAL LOW (ref 34.0–46.6)
Hemoglobin: 8.6 g/dL — ABNORMAL LOW (ref 11.1–15.9)
Immature Grans (Abs): 0 x10E3/uL (ref 0.0–0.1)
Immature Granulocytes: 0 %
Lymphocytes Absolute: 0.7 x10E3/uL (ref 0.7–3.1)
Lymphs: 8 %
MCH: 29.8 pg (ref 26.6–33.0)
MCHC: 31.3 g/dL — ABNORMAL LOW (ref 31.5–35.7)
MCV: 95 fL (ref 79–97)
Monocytes Absolute: 0.2 x10E3/uL (ref 0.1–0.9)
Monocytes: 2 %
Neutrophils Absolute: 8.3 x10E3/uL — ABNORMAL HIGH (ref 1.4–7.0)
Neutrophils: 90 %
Platelets: 312 x10E3/uL (ref 150–450)
RBC: 2.89 x10E6/uL — ABNORMAL LOW (ref 3.77–5.28)
RDW: 15.5 % — ABNORMAL HIGH (ref 11.7–15.4)
WBC: 9.3 x10E3/uL (ref 3.4–10.8)

## 2023-12-05 LAB — CMP14+EGFR
ALT: 14 IU/L (ref 0–32)
AST: 13 IU/L (ref 0–40)
Albumin: 3.1 g/dL — ABNORMAL LOW (ref 3.8–4.9)
Alkaline Phosphatase: 69 IU/L (ref 44–121)
BUN/Creatinine Ratio: 11 (ref 9–23)
BUN: 42 mg/dL — ABNORMAL HIGH (ref 6–24)
Bilirubin Total: 0.4 mg/dL (ref 0.0–1.2)
CO2: 22 mmol/L (ref 20–29)
Calcium: 9.1 mg/dL (ref 8.7–10.2)
Chloride: 95 mmol/L — ABNORMAL LOW (ref 96–106)
Creatinine, Ser: 3.67 mg/dL — ABNORMAL HIGH (ref 0.57–1.00)
Globulin, Total: 3.3 g/dL (ref 1.5–4.5)
Glucose: 132 mg/dL — ABNORMAL HIGH (ref 70–99)
Potassium: 5.2 mmol/L (ref 3.5–5.2)
Sodium: 138 mmol/L (ref 134–144)
Total Protein: 6.4 g/dL (ref 6.0–8.5)
eGFR: 14 mL/min/1.73 — ABNORMAL LOW (ref >59)

## 2023-12-05 LAB — VITAMIN D 25 HYDROXY (VIT D DEFICIENCY, FRACTURES): Vit D, 25-Hydroxy: 23.5 ng/mL — ABNORMAL LOW (ref 30.0–100.0)

## 2023-12-05 LAB — LIPID PANEL
Chol/HDL Ratio: 4.1 ratio (ref 0.0–4.4)
Cholesterol, Total: 114 mg/dL (ref 100–199)
HDL: 28 mg/dL — ABNORMAL LOW (ref >39)
LDL Chol Calc (NIH): 71 mg/dL (ref 0–99)
Triglycerides: 69 mg/dL (ref 0–149)
VLDL Cholesterol Cal: 15 mg/dL (ref 5–40)

## 2023-12-05 LAB — HEMOGLOBIN A1C
Est. average glucose Bld gHb Est-mCnc: 94 mg/dL
Hgb A1c MFr Bld: 4.9 % (ref 4.8–5.6)

## 2023-12-05 LAB — TSH+FREE T4
Free T4: 1.1 ng/dL (ref 0.82–1.77)
TSH: 7.66 u[IU]/mL — ABNORMAL HIGH (ref 0.450–4.500)

## 2023-12-10 ENCOUNTER — Ambulatory Visit: Admitting: Surgery

## 2023-12-11 ENCOUNTER — Other Ambulatory Visit: Payer: Self-pay | Admitting: Family Medicine

## 2023-12-16 ENCOUNTER — Ambulatory Visit: Admitting: Surgery

## 2023-12-16 ENCOUNTER — Telehealth: Payer: Self-pay

## 2023-12-16 NOTE — Telephone Encounter (Signed)
 Please advice

## 2023-12-23 ENCOUNTER — Ambulatory Visit (INDEPENDENT_AMBULATORY_CARE_PROVIDER_SITE_OTHER): Admitting: Surgery

## 2023-12-23 VITALS — BP 100/62 | HR 107 | Temp 98.7°F | Resp 12 | Ht 62.0 in | Wt 100.0 lb

## 2023-12-23 DIAGNOSIS — L89324 Pressure ulcer of left buttock, stage 4: Secondary | ICD-10-CM

## 2023-12-23 DIAGNOSIS — L89314 Pressure ulcer of right buttock, stage 4: Secondary | ICD-10-CM | POA: Diagnosis not present

## 2023-12-26 NOTE — Progress Notes (Signed)
 [facility] Note   HPI:  58 y.o. Female presents to clinic for status post recent admission to the hospital at which time I evaluated her for a left hip decubitus ulcer.  Since that time, she is followed with an outpatient wound specialist with [facility], who performed a debridement of her left hip/ischial decubitus ulcer on 7/22.  Her right sided wound has been stable, but she now has a new right hip wound for which she is receiving wound VAC treatments from this wound specialist.  Patient and her daughter would like referral to a different wound clinic for a second opinion.  She denies fevers and chills.  Review of Systems:  All other review of systems: otherwise negative   Vital Signs:  BP 100/62   Pulse (!) 107   Temp 98.7 F (37.1 C) (Oral)   Resp 12   Ht 5' 2 (1.575 m)   Wt 100 lb (45.4 kg)   LMP  (LMP Unknown)   SpO2 92%   BMI 18.29 kg/m    Physical Exam:  Physical Exam Vitals reviewed.  Constitutional:      Appearance: Normal appearance.  Skin:    General: Skin is warm and dry.     Comments: Patient with a right ischial wound measuring 3 cm with 2.5 cm of tracking superiorly-pink granulation tissue at the base; right hip wound measuring as of 3 cm circle with 2.5 cm of superior tracking-fibrinous exudate along the base; left hip/ischial wound measuring 8 x 5 cm with 4 cm anterior retracted, 3.5 cm posterior tracking, and 1.5 cm tracking around the area of the wound pain granulation tissue at the base, area with some superficial necrosis  Neurological:     Mental Status: She is alert.   Right ischial and hip wounds:   Left ischial/hip wound:    Laboratory studies: None  Imaging:  None   Assessment:  58 y.o. yo Female who presents for follow-up status post recent hospitalization and evaluation of decubitus ulcers.  Plan:  - All wounds were evaluated, and there is no need for urgent debridement of any of her wounds - Advised that they  should continue wet-to-dry dressing changes versus wound VAC per her wound care physician - Will send a referral to wound care clinic and even as a second opinion - Follow-up with me as needed  All of the above recommendations were discussed with the patient and patient's family, and all of patient's and family's questions were answered to their expressed satisfaction.  Note: Portions of this report may have been transcribed using voice recognition software. Every effort has been made to ensure accuracy; however, inadvertent computerized transcription errors may still be present.   Dorothyann Brittle, DO [facility] [address] [place], [place] [address] [phone] (office)

## 2023-12-29 ENCOUNTER — Encounter (HOSPITAL_COMMUNITY): Payer: Self-pay | Admitting: Emergency Medicine

## 2023-12-29 ENCOUNTER — Emergency Department (HOSPITAL_COMMUNITY)

## 2023-12-29 ENCOUNTER — Inpatient Hospital Stay (HOSPITAL_COMMUNITY)
Admission: EM | Admit: 2023-12-29 | Discharge: 2023-12-31 | DRG: 811 | Discharge status: Other Acute Inpatient Hospital | Source: Other Acute Inpatient Hospital | Attending: Internal Medicine | Admitting: Internal Medicine

## 2023-12-29 ENCOUNTER — Other Ambulatory Visit: Payer: Self-pay

## 2023-12-29 DIAGNOSIS — Z833 Family history of diabetes mellitus: Secondary | ICD-10-CM

## 2023-12-29 DIAGNOSIS — Z87820 Personal history of traumatic brain injury: Secondary | ICD-10-CM

## 2023-12-29 DIAGNOSIS — E861 Hypovolemia: Secondary | ICD-10-CM | POA: Diagnosis present

## 2023-12-29 DIAGNOSIS — G40909 Epilepsy, unspecified, not intractable, without status epilepticus: Secondary | ICD-10-CM | POA: Diagnosis present

## 2023-12-29 DIAGNOSIS — D649 Anemia, unspecified: Principal | ICD-10-CM | POA: Diagnosis present

## 2023-12-29 DIAGNOSIS — Z9359 Other cystostomy status: Secondary | ICD-10-CM

## 2023-12-29 DIAGNOSIS — Z881 Allergy status to other antibiotic agents status: Secondary | ICD-10-CM

## 2023-12-29 DIAGNOSIS — I959 Hypotension, unspecified: Secondary | ICD-10-CM | POA: Diagnosis not present

## 2023-12-29 DIAGNOSIS — L89200 Pressure ulcer of unspecified hip, unstageable: Secondary | ICD-10-CM

## 2023-12-29 DIAGNOSIS — N2581 Secondary hyperparathyroidism of renal origin: Secondary | ICD-10-CM | POA: Diagnosis present

## 2023-12-29 DIAGNOSIS — M869 Osteomyelitis, unspecified: Secondary | ICD-10-CM | POA: Diagnosis present

## 2023-12-29 DIAGNOSIS — L89224 Pressure ulcer of left hip, stage 4: Secondary | ICD-10-CM | POA: Diagnosis present

## 2023-12-29 DIAGNOSIS — I10 Essential (primary) hypertension: Secondary | ICD-10-CM | POA: Diagnosis present

## 2023-12-29 DIAGNOSIS — D72829 Elevated white blood cell count, unspecified: Secondary | ICD-10-CM | POA: Diagnosis present

## 2023-12-29 DIAGNOSIS — L8990 Pressure ulcer of unspecified site, unspecified stage: Secondary | ICD-10-CM | POA: Diagnosis present

## 2023-12-29 DIAGNOSIS — K219 Gastro-esophageal reflux disease without esophagitis: Secondary | ICD-10-CM | POA: Diagnosis present

## 2023-12-29 DIAGNOSIS — Z888 Allergy status to other drugs, medicaments and biological substances status: Secondary | ICD-10-CM

## 2023-12-29 DIAGNOSIS — G8220 Paraplegia, unspecified: Secondary | ICD-10-CM | POA: Diagnosis present

## 2023-12-29 DIAGNOSIS — S069XAA Unspecified intracranial injury with loss of consciousness status unknown, initial encounter: Secondary | ICD-10-CM | POA: Diagnosis present

## 2023-12-29 DIAGNOSIS — Z803 Family history of malignant neoplasm of breast: Secondary | ICD-10-CM

## 2023-12-29 DIAGNOSIS — D62 Acute posthemorrhagic anemia: Principal | ICD-10-CM | POA: Diagnosis present

## 2023-12-29 DIAGNOSIS — G8929 Other chronic pain: Secondary | ICD-10-CM | POA: Diagnosis present

## 2023-12-29 DIAGNOSIS — L89309 Pressure ulcer of unspecified buttock, unspecified stage: Secondary | ICD-10-CM | POA: Diagnosis present

## 2023-12-29 DIAGNOSIS — D631 Anemia in chronic kidney disease: Secondary | ICD-10-CM | POA: Diagnosis present

## 2023-12-29 DIAGNOSIS — I132 Hypertensive heart and chronic kidney disease with heart failure and with stage 5 chronic kidney disease, or end stage renal disease: Secondary | ICD-10-CM | POA: Diagnosis present

## 2023-12-29 DIAGNOSIS — R578 Other shock: Secondary | ICD-10-CM | POA: Diagnosis present

## 2023-12-29 DIAGNOSIS — Z8542 Personal history of malignant neoplasm of other parts of uterus: Secondary | ICD-10-CM

## 2023-12-29 DIAGNOSIS — L89214 Pressure ulcer of right hip, stage 4: Secondary | ICD-10-CM | POA: Diagnosis present

## 2023-12-29 DIAGNOSIS — Z79899 Other long term (current) drug therapy: Secondary | ICD-10-CM

## 2023-12-29 DIAGNOSIS — E559 Vitamin D deficiency, unspecified: Secondary | ICD-10-CM | POA: Diagnosis present

## 2023-12-29 DIAGNOSIS — Z8249 Family history of ischemic heart disease and other diseases of the circulatory system: Secondary | ICD-10-CM

## 2023-12-29 DIAGNOSIS — Z992 Dependence on renal dialysis: Secondary | ICD-10-CM

## 2023-12-29 DIAGNOSIS — M009 Pyogenic arthritis, unspecified: Secondary | ICD-10-CM | POA: Diagnosis present

## 2023-12-29 DIAGNOSIS — R6510 Systemic inflammatory response syndrome (SIRS) of non-infectious origin without acute organ dysfunction: Secondary | ICD-10-CM

## 2023-12-29 DIAGNOSIS — I5032 Chronic diastolic (congestive) heart failure: Secondary | ICD-10-CM | POA: Diagnosis present

## 2023-12-29 DIAGNOSIS — N186 End stage renal disease: Secondary | ICD-10-CM | POA: Diagnosis present

## 2023-12-29 DIAGNOSIS — N319 Neuromuscular dysfunction of bladder, unspecified: Secondary | ICD-10-CM | POA: Diagnosis present

## 2023-12-29 LAB — CBC WITH DIFFERENTIAL/PLATELET
Abs Immature Granulocytes: 0.16 K/uL — ABNORMAL HIGH (ref 0.00–0.07)
Basophils Absolute: 0 K/uL (ref 0.0–0.1)
Basophils Relative: 0 %
Eosinophils Absolute: 0.1 K/uL (ref 0.0–0.5)
Eosinophils Relative: 0 %
HCT: 17.7 % — ABNORMAL LOW (ref 36.0–46.0)
Hemoglobin: 5.1 g/dL — CL (ref 12.0–15.0)
Immature Granulocytes: 1 %
Lymphocytes Relative: 4 %
Lymphs Abs: 0.7 K/uL (ref 0.7–4.0)
MCH: 28.5 pg (ref 26.0–34.0)
MCHC: 28.8 g/dL — ABNORMAL LOW (ref 30.0–36.0)
MCV: 98.9 fL (ref 80.0–100.0)
Monocytes Absolute: 0.9 K/uL (ref 0.1–1.0)
Monocytes Relative: 6 %
Neutro Abs: 13.7 K/uL — ABNORMAL HIGH (ref 1.7–7.7)
Neutrophils Relative %: 89 %
Platelets: 345 K/uL (ref 150–400)
RBC: 1.79 MIL/uL — ABNORMAL LOW (ref 3.87–5.11)
RDW: 17.4 % — ABNORMAL HIGH (ref 11.5–15.5)
WBC: 15.5 K/uL — ABNORMAL HIGH (ref 4.0–10.5)
nRBC: 0 % (ref 0.0–0.2)

## 2023-12-29 LAB — RETICULOCYTES
Immature Retic Fract: 28.9 % — ABNORMAL HIGH (ref 2.3–15.9)
RBC.: 1.78 MIL/uL — ABNORMAL LOW (ref 3.87–5.11)
Retic Count, Absolute: 32.4 K/uL (ref 19.0–186.0)
Retic Ct Pct: 1.8 % (ref 0.4–3.1)

## 2023-12-29 LAB — IRON AND TIBC
Iron: 9 ug/dL — ABNORMAL LOW (ref 28–170)
Saturation Ratios: 9 % — ABNORMAL LOW (ref 10.4–31.8)
TIBC: 104 ug/dL — ABNORMAL LOW (ref 250–450)
UIBC: 95 ug/dL

## 2023-12-29 LAB — VITAMIN B12: Vitamin B-12: 331 pg/mL (ref 180–914)

## 2023-12-29 LAB — BASIC METABOLIC PANEL WITH GFR
Anion gap: 17 — ABNORMAL HIGH (ref 5–15)
BUN: 75 mg/dL — ABNORMAL HIGH (ref 6–20)
CO2: 21 mmol/L — ABNORMAL LOW (ref 22–32)
Calcium: 8.4 mg/dL — ABNORMAL LOW (ref 8.9–10.3)
Chloride: 96 mmol/L — ABNORMAL LOW (ref 98–111)
Creatinine, Ser: 7.37 mg/dL — ABNORMAL HIGH (ref 0.44–1.00)
GFR, Estimated: 6 mL/min — ABNORMAL LOW (ref >60)
Glucose, Bld: 134 mg/dL — ABNORMAL HIGH (ref 70–99)
Potassium: 3.6 mmol/L (ref 3.5–5.1)
Sodium: 134 mmol/L — ABNORMAL LOW (ref 135–145)

## 2023-12-29 LAB — HEMOGLOBIN AND HEMATOCRIT, BLOOD
HCT: 23.7 % — ABNORMAL LOW (ref 36.0–46.0)
Hemoglobin: 7.4 g/dL — ABNORMAL LOW (ref 12.0–15.0)

## 2023-12-29 LAB — FOLATE: Folate: >40 ng/mL (ref >5.9)

## 2023-12-29 LAB — FERRITIN: Ferritin: 3152 ng/mL — ABNORMAL HIGH (ref 11–307)

## 2023-12-29 LAB — PREPARE RBC (CROSSMATCH)

## 2023-12-29 MED ORDER — ONDANSETRON HCL 4 MG/2ML IJ SOLN: 4.0000 mg | Freq: Four times a day (QID) | INTRAMUSCULAR | Status: DC | PRN

## 2023-12-29 MED ORDER — VANCOMYCIN HCL 500 MG/100ML IV SOLN: 500.0000 mg | INTRAVENOUS | Status: DC

## 2023-12-29 MED ORDER — ENSURE ENLIVE PO LIQD
237.0000 mL | Freq: Three times a day (TID) | ORAL | Status: DC
Start: 2023-12-29 — End: 2024-01-01
  Administered 2023-12-30 – 2023-12-31 (×2): 237 mL via ORAL
  Filled 2023-12-29 (×13): qty 237

## 2023-12-29 MED ORDER — BISACODYL 5 MG PO TBEC: 5.0000 mg | DELAYED_RELEASE_TABLET | Freq: Every day | ORAL | Status: DC | PRN

## 2023-12-29 MED ORDER — NOREPINEPHRINE 4 MG/250ML-% IV SOLN
INTRAVENOUS | Status: AC
  Filled 2023-12-29: qty 250

## 2023-12-29 MED ORDER — SODIUM CHLORIDE 0.9 % IV SOLN: 250.0000 mL | INTRAVENOUS | Status: AC

## 2023-12-29 MED ORDER — CHLORHEXIDINE GLUCONATE CLOTH 2 % EX PADS
6.0000 | MEDICATED_PAD | Freq: Every day | CUTANEOUS | Status: DC
  Administered 2023-12-30 – 2023-12-31 (×2): 6 via TOPICAL

## 2023-12-29 MED ORDER — ACETAMINOPHEN 650 MG RE SUPP: 650.0000 mg | Freq: Four times a day (QID) | RECTAL | Status: DC | PRN

## 2023-12-29 MED ORDER — ACETAMINOPHEN 325 MG PO TABS
650.0000 mg | ORAL_TABLET | Freq: Four times a day (QID) | ORAL | Status: DC | PRN
  Administered 2023-12-31: 650 mg via ORAL
  Filled 2023-12-29: qty 2

## 2023-12-29 MED ORDER — LEVETIRACETAM 500 MG PO TABS
1000.0000 mg | ORAL_TABLET | Freq: Every evening | ORAL | Status: DC
  Administered 2023-12-29 – 2023-12-31 (×3): 1000 mg via ORAL
  Filled 2023-12-29 (×3): qty 2

## 2023-12-29 MED ORDER — RENA-VITE PO TABS
1.0000 | ORAL_TABLET | Freq: Every day | ORAL | Status: DC
Start: 2023-12-30 — End: 2024-01-01
  Administered 2023-12-30 – 2023-12-31 (×2): 1 via ORAL
  Filled 2023-12-29 (×2): qty 1

## 2023-12-29 MED ORDER — CYCLOBENZAPRINE HCL 10 MG PO TABS: 5.0000 mg | ORAL_TABLET | Freq: Two times a day (BID) | ORAL | Status: DC | PRN

## 2023-12-29 MED ORDER — ONDANSETRON HCL 4 MG PO TABS
4.0000 mg | ORAL_TABLET | Freq: Four times a day (QID) | ORAL | Status: DC | PRN
Start: 2023-12-29 — End: 2024-01-01

## 2023-12-29 MED ORDER — TRAZODONE HCL 50 MG PO TABS: 25.0000 mg | ORAL_TABLET | Freq: Every evening | ORAL | Status: DC | PRN

## 2023-12-29 MED ORDER — OXYCODONE HCL 5 MG PO TABS: 5.0000 mg | ORAL_TABLET | Freq: Four times a day (QID) | ORAL | Status: DC | PRN

## 2023-12-29 MED ORDER — SENNOSIDES-DOCUSATE SODIUM 8.6-50 MG PO TABS
2.0000 | ORAL_TABLET | Freq: Every day | ORAL | Status: DC
Start: 2023-12-29 — End: 2024-01-01
  Administered 2023-12-29: 2 via ORAL
  Filled 2023-12-29: qty 2

## 2023-12-29 MED ORDER — NOREPINEPHRINE 4 MG/250ML-% IV SOLN
0.0000 ug/min | INTRAVENOUS | Status: DC
  Administered 2023-12-29: 5 ug/min via INTRAVENOUS

## 2023-12-29 MED ORDER — ACETAMINOPHEN 500 MG PO TABS
1000.0000 mg | ORAL_TABLET | Freq: Once | ORAL | Status: AC
  Administered 2023-12-29: 1000 mg via ORAL
  Filled 2023-12-29: qty 2

## 2023-12-29 MED ORDER — SODIUM CHLORIDE 0.9% IV SOLUTION: Freq: Once | INTRAVENOUS | Status: AC

## 2023-12-29 MED ORDER — FENTANYL CITRATE (PF) 100 MCG/2ML IJ SOLN: 25.0000 ug | INTRAMUSCULAR | Status: DC | PRN

## 2023-12-29 MED ORDER — NOREPINEPHRINE 4 MG/250ML-% IV SOLN
0.0000 ug/min | INTRAVENOUS | Status: DC
  Administered 2023-12-30: 2 ug/min via INTRAVENOUS
  Filled 2023-12-29: qty 250

## 2023-12-29 MED ORDER — GABAPENTIN 100 MG PO CAPS
100.0000 mg | ORAL_CAPSULE | Freq: Two times a day (BID) | ORAL | Status: DC
  Administered 2023-12-29 – 2023-12-31 (×5): 100 mg via ORAL
  Filled 2023-12-29 (×5): qty 1

## 2023-12-29 MED ORDER — PANTOPRAZOLE SODIUM 40 MG PO TBEC
40.0000 mg | DELAYED_RELEASE_TABLET | Freq: Every day | ORAL | Status: DC
Start: 2023-12-29 — End: 2024-01-01
  Administered 2023-12-29 – 2023-12-31 (×3): 40 mg via ORAL
  Filled 2023-12-29 (×3): qty 1

## 2023-12-29 MED ORDER — ALBUTEROL SULFATE (2.5 MG/3ML) 0.083% IN NEBU: 2.5000 mg | INHALATION_SOLUTION | RESPIRATORY_TRACT | Status: DC | PRN

## 2023-12-29 MED ORDER — SEVELAMER CARBONATE 800 MG PO TABS
800.0000 mg | ORAL_TABLET | Freq: Three times a day (TID) | ORAL | Status: DC
  Administered 2023-12-29 – 2023-12-31 (×6): 800 mg via ORAL
  Filled 2023-12-29 (×6): qty 1

## 2023-12-29 NOTE — ED Triage Notes (Signed)
 Pt bib [facility] from [facility] for hypotension of 80 systolic. Pt was c/o of SOB w/ EMS. Sats 96 on RA> pt on 2L. Pt did no get dialysis today. Pt has wounds to buttocks.

## 2023-12-29 NOTE — ED Notes (Signed)
 EDP notified of BP of 74/50  MAP (59), EDP came to bedside. No new verbal orders received.

## 2023-12-29 NOTE — Progress Notes (Signed)
   12/29/23 1559  Provider Notification  Provider Name/Title Dr. Vicci  Date Provider Notified 12/29/23  Time Provider Notified 1559  Method of Notification Virtual Face-to-face  Notification Reason Change in status  Test performed and critical result BP: 70/33 MAP: 46  Provider response See new orders

## 2023-12-29 NOTE — Progress Notes (Signed)
 Pt receives OP HD at [facility], MWF 11:00am chair time. Contacted clinic to inform of pt admission. Will continue to assist as needed.   Sharnee Douglass [phone] Dialysis Navigator  - [facility] # - [phone]

## 2023-12-29 NOTE — Plan of Care (Signed)

## 2023-12-29 NOTE — H&P (Addendum)
 History and Physical  [facility]  [name] FMW:[id number] DOB: [birth year] DOA: 12/29/2023  PCP: [name], [name], FNP  Patient coming from: sent from dialysis center  Level of care: Stepdown  I have personally briefly reviewed patient's old medical records in [facility]  Chief Complaint: hypotension   HPI: [name] is a 58 year old female with history of traumatic brain injury, paraplegia, decubitus ulcers, ESRD on hemodialysis, vitamin D  deficiency, chronic indwelling suprapubic Foley, chronic anemia, hypertension, history of uterine cancer, history of seizures on antiepileptics chronically followed by wound care center and also followed by surgeon Dr. Evonnie was sent from the dialysis center today with concerns for low blood pressure and weakness.  Patient reports generalized weakness symptoms.  She was evaluated in the emergency department and found to have a hemoglobin of 5.1 down from 8.6 on 12/04/2023.  Nephrology was consulted and plan on completing hemodialysis on 12/30/2023 when blood pressures improve and anemia addressed.  Patient was typed and crossed and 2 units of packed red blood cells ordered and admission requested for further management.    Past Medical History:  Diagnosis Date   Abnormal uterine bleeding (AUB) 06/15/2014   Cancer (HCC)    uterine   ESRD on hemodialysis (HCC)    High blood pressure    Paraplegia (lower)    Seizure disorder (HCC)    Seizures (HCC)    Suprapubic catheter (HCC)    Urinary tract infection     Past Surgical History:  Procedure Laterality Date   APPLICATION OF WOUND VAC Right 09/13/2021   (approximately 1-81mos ago) pressure sore on right hip   BACK SURGERY     Pt stated before 2000   BIOPSY  12/03/2022   Procedure: BIOPSY;  Surgeon: [name], MD;  [facility];  Service: Gastroenterology;;   ESOPHAGOGASTRODUODENOSCOPY N/A 09/20/2015   Procedure: ESOPHAGOGASTRODUODENOSCOPY  (EGD);  Surgeon: [name], MD;  [facility];  Service: Endoscopy;  Laterality: N/A;  730   ESOPHAGOGASTRODUODENOSCOPY (EGD) WITH PROPOFOL  N/A 12/03/2022   Procedure: ESOPHAGOGASTRODUODENOSCOPY (EGD) WITH PROPOFOL ;  Surgeon: [name], MD;  Location: [facility];  Service: Gastroenterology;  Laterality: N/A;  1:15 pm, asa 3, pt knows to arrive at 10:30  dialysis pt, M,W & F   IR CATHETER TUBE CHANGE  04/02/2018   PERCUTANEOUS ENDOSCOPIC GASTROSTOMY (PEG) REMOVAL N/A 09/20/2015   Procedure: PERCUTANEOUS ENDOSCOPIC GASTROSTOMY (PEG) REMOVAL;  Surgeon: [name], MD;  Location[facility];  Service: Endoscopy;  Laterality: N/A;   TEE WITHOUT CARDIOVERSION N/A 11/11/2022   Procedure: TRANSESOPHAGEAL ECHOCARDIOGRAM (TEE);  Surgeon: [name], MD;  Location: [facility];  Service: Cardiovascular;  Laterality: N/A;     reports that she has never smoked. She has never used smokeless tobacco. She reports current alcohol use. She reports that she does not use drugs.  Allergies  Allergen Reactions   Cefepime  Other (See Comments)    Severe AMS 09/2023 admission   Linezolid Other (See Comments)    Patient self-discontinued treatment due to GI intolerance. Taking it along with moxifloxacin   Moxifloxacin Other (See Comments)    Patient self-discontinued treatment due to GI intolerance. Taking it along with linezolid   Quinine Derivatives Other (See Comments)    Alters mental status   Vancomycin  Other (See Comments)    Unknown  Pt is tolerating this medication at HD   Azithromycin  Itching and Rash   Benadryl [Diphenhydramine Hcl (Sleep)] Hives  Daptomycin Hives   Tetracycline Itching    Able to tolerate Doxycycline .    Zosyn  [Piperacillin  Sod-Tazobactam So] Rash    Family History  Problem Relation Age of Onset   Cancer Mother    Hypertension Mother    Cancer Sister        breast and then spread everywhere.   Diabetes Paternal Grandmother     Hypertension Paternal Grandmother     Prior to Admission medications   Medication Sig Start Date End Date Taking? Authorizing Provider  acetaminophen  (TYLENOL ) 500 MG tablet Take 1,000 mg by mouth every 6 (six) hours as needed for moderate pain.    [provider]  albuterol  (PROVENTIL ) (2.5 MG/3ML) 0.083% nebulizer solution Take 3 mLs (2.5 mg total) by nebulization every 4 (four) hours as needed for wheezing or shortness of breath. 04/15/23 04/14/24  Pearlean Manus, MD  amLODipine  (NORVASC ) 10 MG tablet Take 1 tablet (10 mg total) by mouth every evening. Take in the evenings -- 11/24/23   Pearlean Manus, MD  carvedilol  (COREG ) 12.5 MG tablet Take 1 tablet (12.5 mg total) by mouth 2 (two) times daily with a meal. 11/24/23   Emokpae, Courage, MD  cloNIDine  (CATAPRES  - DOSED IN MG/24 HR) 0.1 mg/24hr patch Place 1 patch (0.1 mg total) onto the skin once a week. 11/24/23   Pearlean Manus, MD  collagenase  (SANTYL ) 250 UNIT/GM ointment Apply 1 Application topically daily. 11/24/23   Pearlean Manus, MD  cyclobenzaprine  (FLEXERIL ) 5 MG tablet Take 1 tablet (5 mg total) by mouth 2 (two) times daily as needed for muscle spasms. 11/24/23   Pearlean Manus, MD  feeding supplement (ENSURE ENLIVE / ENSURE PLUS) LIQD Take 237 mLs by mouth 2 (two) times daily between meals. Patient taking differently: Take 237 mLs by mouth 3 (three) times daily between meals. 09/30/23 12/29/23  Akula, Vijaya, MD  furosemide  (LASIX ) 80 MG tablet Take 1 tablet (80 mg total) by mouth every Tuesday, Thursday, Saturday, and Sunday. 09/25/23   Pearlean Manus, MD  gabapentin  (NEURONTIN ) 100 MG capsule Take 1 capsule (100 mg total) by mouth 2 (two) times daily. 12/11/23   [name], [name], FNP  irbesartan  (AVAPRO ) 150 MG tablet Take 1 tablet (150 mg total) by mouth daily. Take 150 mg by mouth daily. 08/07/23   [name], [name], FNP  levETIRAcetam  (KEPPRA ) 500 MG tablet Take 2 tablets (1,000 mg total) by mouth every evening. Take an  additional tablet on Monday,Wednesday and Friday evening after Dialysis 11/24/23   Pearlean Manus, MD  lidocaine -prilocaine  (EMLA ) cream Apply 1 Application topically 3 (three) times a week. 06/04/22   [provider]  multivitamin (RENA-VIT) TABS tablet Take 1 tablet by mouth daily. 07/29/23   [provider]  pantoprazole  (PROTONIX ) 40 MG tablet Take 1 tablet (40 mg total) by mouth daily. 11/24/23   Pearlean Manus, MD  senna-docusate (SENOKOT-S) 8.6-50 MG tablet Take 2 tablets by mouth at bedtime. 11/24/23   Pearlean Manus, MD  sevelamer  carbonate (RENVELA ) 800 MG tablet Take 800 mg by mouth 3 (three) times daily. 12/11/22   [provider]  vancomycin  (VANCOREADY) 500 MG/100ML IVPB Inject 100 mLs (500 mg total) into the vein every Monday, Wednesday, and Friday with hemodialysis. -iv Vancomycin  500 mg on MWF with Hemodialysis for 4 weeks --(Last Dose 12/24/23) 11/24/23   Pearlean Manus, MD    Physical Exam: Vitals:   12/29/23 1240 12/29/23 1241 12/29/23 1241 12/29/23 1417  BP: (!) 90/56   (!) 95/58  Pulse:  92  93  Resp:    (!) 22  Temp: 98.7 F (37.1 C)     TempSrc: Oral     SpO2:  92% 92% 96%  Weight:      Height:        Constitutional: NAD, calm, comfortable, appears pale and chronically ill.  Eyes: PERRL, lids and conjunctivae normal ENMT: Mucous membranes are moist. Posterior pharynx clear of any exudate or lesions.  Neck: normal, supple, no masses, no thyromegaly Respiratory: clear to auscultation bilaterally, no wheezing, no crackles. Normal respiratory effort. No accessory muscle use.  Cardiovascular: normal s1, s2 sounds, no murmurs / rubs / gallops. No extremity edema. 2+ pedal pulses. No carotid bruits.  Abdomen: no tenderness, no masses palpated. No hepatosplenomegaly. Bowel sounds positive.  Musculoskeletal: no clubbing / cyanosis. No joint deformity upper and lower extremities. Good ROM, no contractures. Normal muscle tone.  Skin: chronic  decubitus ulcers. Large left hip decubitus ulcer.  Neurologic: paraplegic.  Psychiatric: Normal judgment and insight. Alert and oriented x 3. Normal mood.   Labs on Admission: I have personally reviewed following labs and imaging studies  CBC: Recent Labs  Lab 12/29/23 1301  WBC 15.5*  NEUTROABS 13.7*  HGB 5.1*  HCT 17.7*  MCV 98.9  PLT 345   Basic Metabolic Panel: Recent Labs  Lab 12/29/23 1301  NA 134*  K 3.6  CL 96*  CO2 21*  GLUCOSE 134*  BUN 75*  CREATININE 7.37*  CALCIUM  8.4*   GFR: Estimated Creatinine Clearance: 6.1 mL/min (A) (by C-G formula based on SCr of 7.37 mg/dL (H)). Liver Function Tests: No results for input(s): AST, ALT, ALKPHOS, BILITOT, PROT, ALBUMIN  in the last 168 hours. No results for input(s): LIPASE, AMYLASE in the last 168 hours. No results for input(s): AMMONIA in the last 168 hours. Coagulation Profile: No results for input(s): INR, PROTIME in the last 168 hours. Cardiac Enzymes: No results for input(s): CKTOTAL, CKMB, CKMBINDEX, TROPONINI in the last 168 hours. BNP (last 3 results) No results for input(s): PROBNP in the last 8760 hours. HbA1C: No results for input(s): HGBA1C in the last 72 hours. CBG: No results for input(s): GLUCAP in the last 168 hours. Lipid Profile: No results for input(s): CHOL, HDL, LDLCALC, TRIG, CHOLHDL, LDLDIRECT in the last 72 hours. Thyroid Function Tests: No results for input(s): TSH, T4TOTAL, FREET4, T3FREE, THYROIDAB in the last 72 hours. Anemia Panel: No results for input(s): VITAMINB12, FOLATE, FERRITIN, TIBC, IRON , RETICCTPCT in the last 72 hours. Urine analysis:    Component Value Date/Time   COLORURINE YELLOW 05/14/2023 0037   APPEARANCEUR CLOUDY (A) 05/14/2023 0037   LABSPEC 1.012 05/14/2023 0037   PHURINE 8.0 05/14/2023 0037   GLUCOSEU >=500 (A) 05/14/2023 0037   HGBUR SMALL (A) 05/14/2023 0037   BILIRUBINUR NEGATIVE  05/14/2023 0037   KETONESUR NEGATIVE 05/14/2023 0037   PROTEINUR >=300 (A) 05/14/2023 0037   UROBILINOGEN 0.2 03/31/2015 1200   NITRITE NEGATIVE 05/14/2023 0037   LEUKOCYTESUR LARGE (A) 05/14/2023 0037    Radiological Exams on Admission: DG Chest Port 1 View Result Date: 12/29/2023 CLINICAL DATA:  Hypotension, shortness of breath. EXAM: PORTABLE CHEST 1 VIEW COMPARISON:  11/20/2023 and CT chest 11/20/2023. FINDINGS: Trachea is midline. Heart is enlarged, stable. Minimal basilar interstitial prominence. Trace bilateral pleural effusions. Biapical pleural thickening. IMPRESSION: Tiny bilateral pleural effusions. Difficult to exclude mild basilar pulmonary edema. Electronically Signed   By: Newell Eke M.D.   On: 12/29/2023 13:36   Assessment/Plan Principal Problem:   Symptomatic anemia Active Problems:  Hypotension   Paraplegia (HCC)   Decubitus ulcer of buttock   Seizure disorder (HCC)   Essential hypertension   TBI (traumatic brain injury) (HCC)   Pressure ulcer   GERD (gastroesophageal reflux disease)   Decubitus ulcers   Chronic anemia   ESRD on hemodialysis (HCC)   Chronic pain   Chronic diastolic CHF (congestive heart failure) (HCC)   Symptomatic Anemia Acute blood loss anemia -- Hg down to 5.1 from 8.6 on 12/04/23 -- agree with type and cross and transfuse PRBC -- 2 units PRBC ordered today -- if needed would give more PRBC tomorrow with dialysis treatment -- follow up anemia panel results pending -- hemoccult stool  ESRD on HD -- HD planned for 12/30/23 per nephrology team -- ED consulted nephrologist  Seizure Disorders -- seizure precautions -- resume home antiepileptic (keppra )   Chronic left hip Decubitus Ulcers New right hip wound now on wound vac  -- recently seen by surgery and wound care clinic -- pt had a debridement of her left hip/ischial decubitus ulcer on 12/16/23 -- wound vac on newer right hip decubitus ulcer per wound care specialist  --  continue chronic wound care orders -- pt just completed a 4 week course of IV vancomycin  MWF with dialysis on 12/24/23  Essential hypertension -- with current hypotension holding all home antihypertensives  Hypotension -- likely hypovolemic and will improve with transfusion of PRBCs -- holding antihypertensives -- start IV norepinephrine  if not improving  -- remove clonidine  patch if still on skin  Chronic pain -- careful with opioids given hypotension   Chronic HFpEF -- volume management with HD   Leukocytosis -- possibly reactive -- no clear s/s of infection -- follow -- she just completed a 4 week course of IV vanc with HD on 12/24/23 -- no clear signs of infections in the wounds -- monitor for now -- check blood culture x 2   DVT prophylaxis: SCD  Code Status: Full   Family Communication:   Disposition Plan: home   Consults called: nephrology   Admission status: OBV Time spent: 63 mins  Level of care: Stepdown Afton Louder MD [facility] How to contact the [facility] Attending or Consulting provider 7A - 7P or covering provider during after hours 7P -7A, for this patient?  Check the care team in [facility] and look for a) attending/consulting [facility] provider listed and b) the [facility] team listed Log into [email or web address] and use [place]'s universal password to access. If you do not have the password, please contact the hospital operator. Locate the [facility] provider you are looking for under [facility] and page to a number that you can be directly reached. If you still have difficulty reaching the provider, please page the [facility] (Director on Call) for the Hospitalists listed on amion for assistance.   If 7PM-7AM, please contact night-coverage [email or web address] Password [email or web address]  12/29/2023, 2:29 PM

## 2023-12-29 NOTE — Plan of Care (Signed)

## 2023-12-29 NOTE — ED Provider Notes (Addendum)
 [place] EMERGENCY DEPARTMENT AT [facility] Provider Note   CSN: [id number] Arrival date & time: 12/29/23  1230     Patient presents with: Hypotension   [name] is a 58 y.o. female.   HPI Presents from dialysis with staff concern of hypotension, weakness. Patient denies pain, denies fever, states that she had dialysis 3 days ago on schedule.  Today at dialysis she appeared weaker than usual was hypotensive and was sent here for evaluation.  Patient notes ongoing therapy for posterior ulcers, no changes, no current concerns according to her. No fever, chest pain, abdominal pain.    Prior to Admission medications   Medication Sig Start Date End Date Taking? Authorizing Provider  acetaminophen  (TYLENOL ) 500 MG tablet Take 1,000 mg by mouth every 6 (six) hours as needed for moderate pain.    [provider]  albuterol  (PROVENTIL ) (2.5 MG/3ML) 0.083% nebulizer solution Take 3 mLs (2.5 mg total) by nebulization every 4 (four) hours as needed for wheezing or shortness of breath. 04/15/23 04/14/24  Pearlean Manus, MD  amLODipine  (NORVASC ) 10 MG tablet Take 1 tablet (10 mg total) by mouth every evening. Take in the evenings -- 11/24/23   Pearlean Manus, MD  carvedilol  (COREG ) 12.5 MG tablet Take 1 tablet (12.5 mg total) by mouth 2 (two) times daily with a meal. 11/24/23   Emokpae, Courage, MD  cloNIDine  (CATAPRES  - DOSED IN MG/24 HR) 0.1 mg/24hr patch Place 1 patch (0.1 mg total) onto the skin once a week. 11/24/23   Pearlean Manus, MD  collagenase  (SANTYL ) 250 UNIT/GM ointment Apply 1 Application topically daily. 11/24/23   Pearlean Manus, MD  cyclobenzaprine  (FLEXERIL ) 5 MG tablet Take 1 tablet (5 mg total) by mouth 2 (two) times daily as needed for muscle spasms. 11/24/23   Pearlean Manus, MD  feeding supplement (ENSURE ENLIVE / ENSURE PLUS) LIQD Take 237 mLs by mouth 2 (two) times daily between meals. Patient taking differently: Take 237 mLs by mouth 3  (three) times daily between meals. 09/30/23 12/29/23  Akula, Vijaya, MD  furosemide  (LASIX ) 80 MG tablet Take 1 tablet (80 mg total) by mouth every Tuesday, Thursday, Saturday, and Sunday. 09/25/23   Pearlean Manus, MD  gabapentin  (NEURONTIN ) 100 MG capsule Take 1 capsule (100 mg total) by mouth 2 (two) times daily. 12/11/23   [name], [name], FNP  irbesartan  (AVAPRO ) 150 MG tablet Take 1 tablet (150 mg total) by mouth daily. Take 150 mg by mouth daily. 08/07/23   [name], [name], FNP  levETIRAcetam  (KEPPRA ) 500 MG tablet Take 2 tablets (1,000 mg total) by mouth every evening. Take an additional tablet on Monday,Wednesday and Friday evening after Dialysis 11/24/23   Pearlean Manus, MD  lidocaine -prilocaine  (EMLA ) cream Apply 1 Application topically 3 (three) times a week. 06/04/22   [provider]  multivitamin (RENA-VIT) TABS tablet Take 1 tablet by mouth daily. 07/29/23   [provider]  pantoprazole  (PROTONIX ) 40 MG tablet Take 1 tablet (40 mg total) by mouth daily. 11/24/23   Pearlean Manus, MD  senna-docusate (SENOKOT-S) 8.6-50 MG tablet Take 2 tablets by mouth at bedtime. 11/24/23   Pearlean Manus, MD  sevelamer  carbonate (RENVELA ) 800 MG tablet Take 800 mg by mouth 3 (three) times daily. 12/11/22   [provider]  vancomycin  (VANCOREADY) 500 MG/100ML IVPB Inject 100 mLs (500 mg total) into the vein every Monday, Wednesday, and Friday with hemodialysis. -iv Vancomycin  500 mg on MWF with Hemodialysis for 4 weeks --(Last Dose 12/24/23) 11/24/23  Pearlean Manus, MD    Allergies: Cefepime , Linezolid, Moxifloxacin, Quinine derivatives, Vancomycin , Azithromycin , Benadryl [diphenhydramine hcl (sleep)], Daptomycin, Tetracycline, and Zosyn  [piperacillin  sod-tazobactam so]    Review of Systems  Updated Vital Signs BP (!) 90/56   Pulse 92   Temp 98.7 F (37.1 C) (Oral)   Ht 5' 2 (1.575 m)   Wt 45 kg   LMP  (LMP Unknown)   SpO2 92%   BMI 18.15 kg/m   Physical  Exam Vitals and nursing note reviewed.  Constitutional:      General: She is not in acute distress.    Appearance: She is well-developed.  HENT:     Head: Normocephalic and atraumatic.  Eyes:     Conjunctiva/sclera: Conjunctivae normal.  Cardiovascular:     Rate and Rhythm: Normal rate and regular rhythm.  Pulmonary:     Effort: Pulmonary effort is normal. No respiratory distress.     Breath sounds: Normal breath sounds. No stridor.  Abdominal:     General: There is no distension.  Skin:    General: Skin is warm and dry.  Neurological:     Mental Status: She is alert and oriented to person, place, and time.     Cranial Nerves: No cranial nerve deficit.  Psychiatric:        Mood and Affect: Mood normal.     (all labs ordered are listed, but only abnormal results are displayed) Labs Reviewed  CBC WITH DIFFERENTIAL/PLATELET - Abnormal; Notable for the following components:      Result Value   WBC 15.5 (*)    RBC 1.79 (*)    Hemoglobin 5.1 (*)    HCT 17.7 (*)    MCHC 28.8 (*)    RDW 17.4 (*)    Neutro Abs 13.7 (*)    Abs Immature Granulocytes 0.16 (*)    All other components within normal limits  BASIC METABOLIC PANEL WITH GFR - Abnormal; Notable for the following components:   Sodium 134 (*)    Chloride 96 (*)    CO2 21 (*)    Glucose, Bld 134 (*)    BUN 75 (*)    Creatinine, Ser 7.37 (*)    Calcium  8.4 (*)    GFR, Estimated 6 (*)    Anion gap 17 (*)    All other components within normal limits  TYPE AND SCREEN  PREPARE RBC (CROSSMATCH)    EKG: None  Radiology: [facility] Chest Port 1 View Result Date: 12/29/2023 CLINICAL DATA:  Hypotension, shortness of breath. EXAM: PORTABLE CHEST 1 VIEW COMPARISON:  11/20/2023 and CT chest 11/20/2023. FINDINGS: Trachea is midline. Heart is enlarged, stable. Minimal basilar interstitial prominence. Trace bilateral pleural effusions. Biapical pleural thickening. IMPRESSION: Tiny bilateral pleural effusions. Difficult to exclude mild  basilar pulmonary edema. Electronically Signed   By: Newell Eke M.D.   On: 12/29/2023 13:36     Procedures   Medications Ordered in the ED  0.9 %  sodium chloride  infusion (Manually program via Guardrails IV Fluids) (has no administration in time range)                                    Medical Decision Making Chronically ill patient with open wound of backside, end-stage renal disease, now presents with weakness, hypotension.  Patient has a history of chronic anemia there is concern for this versus infection versus hypovolemia. Cardiac 90 sinus normal pulse ox 92%  room air borderline low   Amount and/or Complexity of Data Reviewed External Data Reviewed: notes. Labs: ordered. Decision-making details documented in ED Course.  Risk Prescription drug management. Decision regarding hospitalization. Diagnosis or treatment significantly limited by social determinants of health.   1:54 PM Patient in no distress, blood pressure now slightly improved.  Hemoglobin critically abnormal, 5.1 Discussed indication for transfusion which she has had multiple times in the past.  With microcytic anemia, chronic anemia from renal dysfunction, patient will require admission, transfusion.  Nephrology made aware of the patient's presentation.     Final diagnoses:  Symptomatic anemia    ED Discharge Orders     None      CRITICAL CARE Performed by: Lamar Salen Total critical care time: 35 minutes Critical care time was exclusive of separately billable procedures and treating other patients. Critical care was necessary to treat or prevent imminent or life-threatening deterioration. Critical care was time spent personally by me on the following activities: development of treatment plan with patient and/or surrogate as well as nursing, discussions with consultants, evaluation of patient's response to treatment, examination of patient, obtaining history from patient or surrogate,  ordering and performing treatments and interventions, ordering and review of laboratory studies, ordering and review of radiographic studies, pulse oximetry and re-evaluation of patient's condition.    Salen Lamar, MD 12/29/23 1356

## 2023-12-29 NOTE — Hospital Course (Addendum)
 58 year old female with history of traumatic brain injury, paraplegia, decubitus ulcers, ESRD on hemodialysis, vitamin D  deficiency, chronic indwelling suprapubic Foley, chronic anemia, hypertension, history of uterine cancer, history of seizures on antiepileptics chronically followed by wound care center and also followed by surgeon Dr. Evonnie was sent from the dialysis center today with concerns for low blood pressure and weakness.  Patient reports generalized weakness symptoms.  She was evaluated in the emergency department and found to have a hemoglobin of 5.1 down from 8.6 on 12/04/2023.  Pt had a debridement of her left hip decubitus ulcer by wound care specialist on 12/16/23.  Nephrology was consulted and plan on completing hemodialysis on 12/30/2023 when blood pressures improve and anemia addressed.  Patient was typed and crossed and 2 units of packed red blood cells ordered and admission requested for further management.

## 2023-12-30 ENCOUNTER — Observation Stay (HOSPITAL_COMMUNITY)

## 2023-12-30 ENCOUNTER — Inpatient Hospital Stay (HOSPITAL_COMMUNITY)

## 2023-12-30 DIAGNOSIS — Z888 Allergy status to other drugs, medicaments and biological substances status: Secondary | ICD-10-CM | POA: Diagnosis not present

## 2023-12-30 DIAGNOSIS — K219 Gastro-esophageal reflux disease without esophagitis: Secondary | ICD-10-CM | POA: Diagnosis present

## 2023-12-30 DIAGNOSIS — L89224 Pressure ulcer of left hip, stage 4: Secondary | ICD-10-CM | POA: Diagnosis present

## 2023-12-30 DIAGNOSIS — I132 Hypertensive heart and chronic kidney disease with heart failure and with stage 5 chronic kidney disease, or end stage renal disease: Secondary | ICD-10-CM | POA: Diagnosis present

## 2023-12-30 DIAGNOSIS — N186 End stage renal disease: Secondary | ICD-10-CM | POA: Diagnosis present

## 2023-12-30 DIAGNOSIS — D631 Anemia in chronic kidney disease: Secondary | ICD-10-CM | POA: Diagnosis present

## 2023-12-30 DIAGNOSIS — N2581 Secondary hyperparathyroidism of renal origin: Secondary | ICD-10-CM | POA: Diagnosis present

## 2023-12-30 DIAGNOSIS — E861 Hypovolemia: Secondary | ICD-10-CM

## 2023-12-30 DIAGNOSIS — D649 Anemia, unspecified: Secondary | ICD-10-CM

## 2023-12-30 DIAGNOSIS — D62 Acute posthemorrhagic anemia: Secondary | ICD-10-CM | POA: Diagnosis present

## 2023-12-30 DIAGNOSIS — I5032 Chronic diastolic (congestive) heart failure: Secondary | ICD-10-CM | POA: Diagnosis present

## 2023-12-30 DIAGNOSIS — Z992 Dependence on renal dialysis: Secondary | ICD-10-CM

## 2023-12-30 DIAGNOSIS — Z9359 Other cystostomy status: Secondary | ICD-10-CM | POA: Diagnosis not present

## 2023-12-30 DIAGNOSIS — G40909 Epilepsy, unspecified, not intractable, without status epilepticus: Secondary | ICD-10-CM

## 2023-12-30 DIAGNOSIS — Z8542 Personal history of malignant neoplasm of other parts of uterus: Secondary | ICD-10-CM | POA: Diagnosis not present

## 2023-12-30 DIAGNOSIS — M009 Pyogenic arthritis, unspecified: Secondary | ICD-10-CM | POA: Diagnosis present

## 2023-12-30 DIAGNOSIS — I959 Hypotension, unspecified: Secondary | ICD-10-CM | POA: Diagnosis present

## 2023-12-30 DIAGNOSIS — Z8249 Family history of ischemic heart disease and other diseases of the circulatory system: Secondary | ICD-10-CM | POA: Diagnosis not present

## 2023-12-30 DIAGNOSIS — G8929 Other chronic pain: Secondary | ICD-10-CM | POA: Diagnosis present

## 2023-12-30 DIAGNOSIS — G8220 Paraplegia, unspecified: Secondary | ICD-10-CM

## 2023-12-30 DIAGNOSIS — L89214 Pressure ulcer of right hip, stage 4: Secondary | ICD-10-CM | POA: Diagnosis present

## 2023-12-30 DIAGNOSIS — M869 Osteomyelitis, unspecified: Secondary | ICD-10-CM | POA: Diagnosis present

## 2023-12-30 DIAGNOSIS — Z881 Allergy status to other antibiotic agents status: Secondary | ICD-10-CM | POA: Diagnosis not present

## 2023-12-30 DIAGNOSIS — R578 Other shock: Secondary | ICD-10-CM | POA: Diagnosis present

## 2023-12-30 DIAGNOSIS — Z833 Family history of diabetes mellitus: Secondary | ICD-10-CM | POA: Diagnosis not present

## 2023-12-30 DIAGNOSIS — Z79899 Other long term (current) drug therapy: Secondary | ICD-10-CM | POA: Diagnosis not present

## 2023-12-30 LAB — CBC WITH DIFFERENTIAL/PLATELET
Abs Immature Granulocytes: 0.1 K/uL — ABNORMAL HIGH (ref 0.00–0.07)
Basophils Absolute: 0 K/uL (ref 0.0–0.1)
Basophils Relative: 0 %
Eosinophils Absolute: 0.2 K/uL (ref 0.0–0.5)
Eosinophils Relative: 2 %
HCT: 24.7 % — ABNORMAL LOW (ref 36.0–46.0)
Hemoglobin: 7.7 g/dL — ABNORMAL LOW (ref 12.0–15.0)
Immature Granulocytes: 1 %
Lymphocytes Relative: 7 %
Lymphs Abs: 0.8 K/uL (ref 0.7–4.0)
MCH: 28.9 pg (ref 26.0–34.0)
MCHC: 31.2 g/dL (ref 30.0–36.0)
MCV: 92.9 fL (ref 80.0–100.0)
Monocytes Absolute: 0.6 K/uL (ref 0.1–1.0)
Monocytes Relative: 6 %
Neutro Abs: 9.1 K/uL — ABNORMAL HIGH (ref 1.7–7.7)
Neutrophils Relative %: 84 %
Platelets: 352 K/uL (ref 150–400)
RBC: 2.66 MIL/uL — ABNORMAL LOW (ref 3.87–5.11)
RDW: 17 % — ABNORMAL HIGH (ref 11.5–15.5)
WBC: 10.8 K/uL — ABNORMAL HIGH (ref 4.0–10.5)
nRBC: 0 % (ref 0.0–0.2)

## 2023-12-30 LAB — TYPE AND SCREEN
ABO/RH(D): A POS
Antibody Screen: NEGATIVE
Unit division: 0
Unit division: 0

## 2023-12-30 LAB — RENAL FUNCTION PANEL
Albumin: 1.8 g/dL — ABNORMAL LOW (ref 3.5–5.0)
Anion gap: 17 — ABNORMAL HIGH (ref 5–15)
BUN: 80 mg/dL — ABNORMAL HIGH (ref 6–20)
CO2: 21 mmol/L — ABNORMAL LOW (ref 22–32)
Calcium: 8.6 mg/dL — ABNORMAL LOW (ref 8.9–10.3)
Chloride: 91 mmol/L — ABNORMAL LOW (ref 98–111)
Creatinine, Ser: 7.69 mg/dL — ABNORMAL HIGH (ref 0.44–1.00)
GFR, Estimated: 6 mL/min — ABNORMAL LOW (ref >60)
Glucose, Bld: 116 mg/dL — ABNORMAL HIGH (ref 70–99)
Phosphorus: 5.7 mg/dL — ABNORMAL HIGH (ref 2.5–4.6)
Potassium: 4.1 mmol/L (ref 3.5–5.1)
Sodium: 129 mmol/L — ABNORMAL LOW (ref 135–145)

## 2023-12-30 LAB — BPAM RBC
Blood Product Expiration Date: 202508302359
Blood Product Expiration Date: 202508312359
ISSUE DATE / TIME: 202508041500
ISSUE DATE / TIME: 202508041646
Unit Type and Rh: 6200
Unit Type and Rh: 6200

## 2023-12-30 LAB — MRSA NEXT GEN BY PCR, NASAL: MRSA by PCR Next Gen: NOT DETECTED

## 2023-12-30 LAB — HEPATITIS B SURFACE ANTIGEN: Hepatitis B Surface Ag: NONREACTIVE

## 2023-12-30 MED ORDER — CALCITRIOL 0.25 MCG PO CAPS: 1.7500 ug | ORAL_CAPSULE | ORAL | Status: DC

## 2023-12-30 MED ORDER — ALBUMIN HUMAN 25 % IV SOLN
25.0000 g | Freq: Once | INTRAVENOUS | Status: AC
  Administered 2023-12-30: 25 g via INTRAVENOUS
  Filled 2023-12-30: qty 100

## 2023-12-30 MED ORDER — CINACALCET HCL 30 MG PO TABS
30.0000 mg | ORAL_TABLET | ORAL | Status: DC
  Administered 2023-12-31: 30 mg via ORAL
  Filled 2023-12-30: qty 1

## 2023-12-30 NOTE — Progress Notes (Signed)
   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 We will continue to monitor patient advancement through interdisciplinary progression rounds. If new patient transition needs arise, please place a TOC consult.

## 2023-12-30 NOTE — Progress Notes (Signed)
 Pt completed HD tx and goal met. Pt had Levophed  drop during tx. Albumin  25 g IV ordered by Dr. Era  phone. Report given to Myrna Nest RN  12/30/23 1930  Vitals  Temp 98 F (36.7 C)  Temp Source Oral  BP 109/72  MAP (mmHg) 81  BP Location Right Arm  BP Method Automatic  Patient Position (if appropriate) Lying  Pulse Rate 98  ECG Heart Rate 98  Resp 20  Oxygen Therapy  SpO2 100 %  O2 Device Nasal Cannula  O2 Flow Rate (L/min) 3 L/min  During Treatment Monitoring  Blood Flow Rate (mL/min) 0 mL/min  Arterial Pressure (mmHg) -74.74 mmHg  Venous Pressure (mmHg) 56.96 mmHg  TMP (mmHg) 7.07 mmHg  Ultrafiltration Rate (mL/min) 1062 mL/min  Dialysate Flow Rate (mL/min) 299 ml/min  Duration of HD Treatment -hour(s) 3 hour(s)  Cumulative Fluid Removed (mL) per Treatment  1900.15  HD Safety Checks Performed Yes  Intra-Hemodialysis Comments Progressing as prescribed  Post Treatment  Dialyzer Clearance Lightly streaked  Hemodialysis Intake (mL) 0 mL  Liters Processed 68  Fluid Removed (mL) 2000 mL  Tolerated HD Treatment Yes  Post-Hemodialysis Comments see notes.  AVG/AVF Arterial Site Held (minutes) 10 minutes  AVG/AVF Venous Site Held (minutes) 10 minutes  Fistula / Graft Left Upper arm Arteriovenous fistula  No placement date or time found.   Placed prior to admission: Yes  Orientation: Left  Access Location: Upper arm  Access Type: Arteriovenous fistula  Site Condition No complications  Fistula / Graft Assessment Present;Thrill;Bruit  Status Deaccessed  Needle Size 15  Drainage Description None

## 2023-12-30 NOTE — Progress Notes (Signed)
 SATURATION QUALIFICATIONS: (This note is used to comply with regulatory documentation for home oxygen)  Patient Saturations on Room Air at Rest = 82%  Patient Saturations on 2L while at Rest = 95%  Patient Saturations on ** Liters of oxygen while Ambulating = patient does not walk  Please briefly explain why patient needs home oxygen: Desats at rest on room air

## 2023-12-30 NOTE — Consult Note (Signed)
 ESRD Consult Note  Requesting provider: Afton Louder Service requesting consult: hospitalist Reason for consult: ESRD, provision of dialysis Indication for acute dialysis?: End Stage Renal Disease  Outpatient dialysis unit: [facility]  Outpatient dialysis prescription: MWF, 3hr , EDW 45kg, LUE AVF, Elisio 17h, BFR 400, DFR 500, 2/2.5 bath, UF profile 2, mircera q2 weeks on 7/28, calcitriol  1.77mcg w/ treatments, sensipar  30mg  with each treatment  Assessment/Recommendations: [name] is a/an 58 y.o. female with a past medical history notable for ESRD on HD admitted with anemia.   # ESRD: Missed HD yesterday. HD today off schedule then resume outpatient schedule tomorrow.  # Volume/ hypertension: does not appear grossly volume overloaded.  Blood pressure fairly low.not currently on any blood pressure medications  # Anemia of Chronic Kidney Disease: due for esa again on 8/11. No iron  given transfusions. Acute anemia as below  # Secondary Hyperparathyroidism/Hyperphosphatemia: Cont home calcitriol  and sensipar . Obtain phos level and cont home binders  # Vascular access: No issues with AVG  # Acute symptomatic anemia: No obvious overt blood loss but hard to think hemoglobin would be this low simply from anemia of chronic disease.  Transfusion per primary team.  Workup for blood loss per primary team   # Additional recommendations: - Dose all meds for creatinine clearance < 10 ml/min  - Unless absolutely necessary, no MRIs with gadolinium.  - Implement save arm precautions.  Prefer needle sticks in the dorsum of the hands or wrists.  No blood pressure measurements in arm. - If blood transfusion is requested during hemodialysis sessions, please alert us  prior to the session.  - Use synthetic opioids (Fentanyl /Dilaudid ) if needed  Recommendations were discussed with the primary team.   History of Present Illness: [name] is a/an 58 y.o. female  with a past medical history of ESRD who presents with shortness of breath and low blood pressure  Patient presented to the hospital yesterday.  She went to dialysis yesterday but was having some generalized weakness as well as low blood pressures.  She also felt short of breath recently particularly with exertion.  She did not have any focal weakness (outside of her baseline) but felt it was more generalized.  She denied fevers, chills, chest pain, nausea, vomiting, diarrhea.  She does currently have a wound on her sacrum with a wound VAC in place and states that this is been going well without any issues.  In the emergency department she was found to have a hemoglobin of 5.1.  She was given 2 units and admitted to the hospital for further management.  She does not know any of any dark stools.  Today she says she feels about the same but does feel cold.   Medications:  Current Facility-Administered Medications  Medication Dose Route Frequency Provider Last Rate Last Admin   0.9 %  sodium chloride  infusion  250 mL Intravenous Continuous Johnson, Clanford L, MD       acetaminophen  (TYLENOL ) tablet 650 mg  650 mg Oral Q6H PRN Johnson, Clanford L, MD       Or   acetaminophen  (TYLENOL ) suppository 650 mg  650 mg Rectal Q6H PRN Johnson, Clanford L, MD       albuterol  (PROVENTIL ) (2.5 MG/3ML) 0.083% nebulizer solution 2.5 mg  2.5 mg Nebulization Q2H PRN Johnson, Clanford L, MD       bisacodyl  (DULCOLAX) EC tablet 5 mg  5 mg Oral Daily PRN Louder Afton CROME, MD       Chlorhexidine   Gluconate Cloth 2 % PADS 6 each  6 each Topical Q0600 Macel Jayson PARAS, MD   6 each at 12/30/23 [id number]   cyclobenzaprine  (FLEXERIL ) tablet 5 mg  5 mg Oral BID PRN Johnson, Clanford L, MD       feeding supplement (ENSURE ENLIVE / ENSURE PLUS) liquid 237 mL  237 mL Oral TID BM Johnson, Clanford L, MD   237 mL at 12/30/23 9078   fentaNYL  (SUBLIMAZE ) injection 25 mcg  25 mcg Intravenous Q2H PRN Johnson, Clanford L, MD        gabapentin  (NEURONTIN ) capsule 100 mg  100 mg Oral BID Vicci, Clanford L, MD   100 mg at 12/30/23 9077   levETIRAcetam  (KEPPRA ) tablet 1,000 mg  1,000 mg Oral QPM Johnson, Clanford L, MD   1,000 mg at 12/29/23 1714   multivitamin (RENA-VIT) tablet 1 tablet  1 tablet Oral Daily Vicci, Clanford L, MD   1 tablet at 12/30/23 9078   norepinephrine  (LEVOPHED ) 4mg  in (0.016 mg/mL) premix infusion  0-10 mcg/min Intravenous Titrated Johnson, Clanford L, MD 18.75 mL/hr at 12/29/23 1753 5 mcg/min at 12/29/23 1753   ondansetron  (ZOFRAN ) tablet 4 mg  4 mg Oral Q6H PRN Johnson, Clanford L, MD       Or   ondansetron  (ZOFRAN ) injection 4 mg  4 mg Intravenous Q6H PRN Johnson, Clanford L, MD       oxyCODONE  (Oxy IR/ROXICODONE ) immediate release tablet 5 mg  5 mg Oral Q6H PRN Johnson, Clanford L, MD       pantoprazole  (PROTONIX ) EC tablet 40 mg  40 mg Oral Daily Johnson, Clanford L, MD   40 mg at 12/30/23 9078   senna-docusate (Senokot-S) tablet 2 tablet  2 tablet Oral QHS Johnson, Clanford L, MD   2 tablet at 12/29/23 2248   sevelamer  carbonate (RENVELA ) tablet 800 mg  800 mg Oral TID WC Johnson, Clanford L, MD   800 mg at 12/30/23 9266   traZODone  (DESYREL ) tablet 25 mg  25 mg Oral QHS PRN Johnson, Clanford L, MD         ALLERGIES Cefepime , Linezolid, Moxifloxacin, Quinine derivatives, Vancomycin , Azithromycin , Benadryl [diphenhydramine hcl (sleep)], Daptomycin, Tetracycline, and Zosyn  [piperacillin  sod-tazobactam so]  MEDICAL HISTORY Past Medical History:  Diagnosis Date   Abnormal uterine bleeding (AUB) 06/15/2014   Cancer (HCC)    uterine   ESRD on hemodialysis (HCC)    High blood pressure    Paraplegia (lower)    Seizure disorder (HCC)    Seizures (HCC)    Suprapubic catheter (HCC)    Urinary tract infection      SOCIAL HISTORY Social History   Socioeconomic History   Marital status: Single    Spouse name: Not on file   Number of children: 3   Years of education: 9 th   Highest  education level: Not on file  Occupational History    Comment: Disabled  Tobacco Use   Smoking status: Never   Smokeless tobacco: Never  Vaping Use   Vaping status: Never Used  Substance and Sexual Activity   Alcohol use: Yes    Comment: Occassionally   Drug use: No   Sexual activity: Never  Other Topics Concern   Not on file  Social History Narrative   Patient lives with her son Selena). Patient is disabled.   Education 9th grade.   Right handed.   Caffeine - None    Social Drivers of Corporate investment banker Strain: Not on file  Food Insecurity: No Food Insecurity (12/29/2023)   Hunger Vital Sign    Worried About Running Out of Food in the Last Year: Never true    Ran Out of Food in the Last Year: Never true  Transportation Needs: No Transportation Needs (12/29/2023)   PRAPARE - Administrator, Civil Service (Medical): No    Lack of Transportation (Non-Medical): No  Physical Activity: Not on file  Stress: Not on file  Social Connections: Not on file  Intimate Partner Violence: Not At Risk (12/29/2023)   Humiliation, Afraid, Rape, and Kick questionnaire    Fear of Current or Ex-Partner: No    Emotionally Abused: No    Physically Abused: No    Sexually Abused: No     FAMILY HISTORY Family History  Problem Relation Age of Onset   Cancer Mother    Hypertension Mother    Cancer Sister        breast and then spread everywhere.   Diabetes Paternal Grandmother    Hypertension Paternal Grandmother      Review of Systems: 12 systems were reviewed and negative except per HPI  Physical Exam: Vitals:   12/30/23 0900 12/30/23 0902  BP: (!) 129/54 (!) 129/54  Pulse: 94 93  Resp:    Temp:    SpO2: 100% 99%   Total I/O In: 120 [P.O.:120] Out: -   Intake/Output Summary (Last 24 hours) at 12/30/2023 9076 Last data filed at 12/30/2023 0816 Gross per 24 hour  Intake 748.75 ml  Output 0 ml  Net 748.75 ml   General: well-appearing, no acute  distress HEENT: anicteric sclera, MMM CV: normal rate, no rub, no lower extremity edema Lungs: bilateral chest rise, normal wob Abd: soft, non-tender, non-distended Skin: no visible lesions or rashes Psych: alert, engaged, appropriate mood and affect Neuro: normal speech, paraplegia present, otherwise no specific deficits  Test Results Reviewed Lab Results  Component Value Date   NA 129 (L) 12/30/2023   K 4.1 12/30/2023   CL 91 (L) 12/30/2023   CO2 21 (L) 12/30/2023   BUN 80 (H) 12/30/2023   CREATININE 7.69 (H) 12/30/2023   CALCIUM  8.6 (L) 12/30/2023   ALBUMIN  1.8 (L) 12/30/2023   PHOS 5.7 (H) 12/30/2023    I have reviewed relevant outside healthcare records

## 2023-12-30 NOTE — Progress Notes (Addendum)
 PROGRESS NOTE   [name]  FMW:[id number] DOB: 1965/11/16 DOA: 12/29/2023 PCP: [name], [name], FNP   Chief Complaint  Patient presents with   Hypotension   Level of care: ICU  Brief Admission History:  58 year old female with history of traumatic brain injury, paraplegia, decubitus ulcers, ESRD on hemodialysis, vitamin D  deficiency, chronic indwelling suprapubic Foley, chronic anemia, hypertension, history of uterine cancer, history of seizures on antiepileptics chronically followed by wound care center and also followed by surgeon Dr. Evonnie was sent from the dialysis center today with concerns for low blood pressure and weakness.  Patient reports generalized weakness symptoms.  She was evaluated in the emergency department and found to have a hemoglobin of 5.1 down from 8.6 on 12/04/2023.  Pt had a debridement of her left hip decubitus ulcer by wound care specialist on 12/16/23.  Nephrology was consulted and plan on completing hemodialysis on 12/30/2023 when blood pressures improve and anemia addressed.  Patient was typed and crossed and 2 units of packed red blood cells ordered and admission requested for further management.   Assessment and Plan:  Symptomatic Anemia Acute blood loss anemia Hemorrhagic Shock -- Hg down to 5.1 from 8.6 on 12/04/23 -- pt did have a wound debridement on 12/16/23 and likely lost blood from procedure -- agree with type and cross and transfuse PRBC -- 2 units PRBC transfused on 8/4 and Hg up to 7/7 -- nephrology didn't feel she needed more PRBC transfused with HD today -- follow up anemia panel: iron  9, TIBC 104, ferritin 3152, folate>40 -- hemoccult stool ordered -- follow CBC   ESRD on HD -- HD planned for 12/30/23 per nephrology team -- ED consulted nephrologist   Seizure Disorders -- seizure precautions -- resume home antiepileptic (keppra )    Chronic left hip Decubitus Ulcers New right hip wound now on wound vac  -- recently seen by  surgery and wound care clinic -- pt had a debridement of her left hip/ischial decubitus ulcer on 12/16/23 -- wound vac on newer right hip decubitus ulcer per wound care specialist  -- continue chronic wound care orders -- pt just completed a 4 week course of IV vancomycin  MWF with dialysis on 12/24/23   Essential hypertension -- with current hypotension holding all home antihypertensives   Hypotension -- resolving hemorrhagic shock -- holding antihypertensives -- IV norepinephrine  restarted  -- remove clonidine  patch if still on skin   Chronic pain -- careful with opioids given hypotension    Chronic HFpEF -- volume management with HD    Leukocytosis -- possibly reactive, trending down today -- no clear s/s of infection -- follow -- she just completed a 4 week course of IV vanc with HD on 12/24/23 -- no clear signs of infections in the wounds and just had a debridement of wounds on 7/22 -- monitor for now -- blood culture x 2 collected, no growth to date -- low threshold to restart abx if develops fever or increasing WBC   Neurogenic Bladder -- has chronic suprapubic catheter -- says she is followed by urology at [facility] -- reports minimal urine output -- says she had suprapubic cath changed last month but poor historian   DVT prophylaxis: scd  Code Status: Full  Family Communication:  Disposition: TBD   Consultants:  Nephrology  Procedures:  hemodialysis Antimicrobials:    Subjective: No specific complaints today  Objective: Vitals:   12/30/23 1245 12/30/23 1300 12/30/23 1315 12/30/23 1330  BP: (!) 129/54 138/76 (!) 127/49 (!) 126/50  Pulse: 94 99 95 95  Resp:  20    Temp:      TempSrc:      SpO2: 100% 100% 100% 100%  Weight:      Height:        Intake/Output Summary (Last 24 hours) at 12/30/2023 1358 Last data filed at 12/30/2023 1213 Gross per 24 hour  Intake 1159.55 ml  Output 0 ml  Net 1159.55 ml   Filed Weights   12/29/23 1238  Weight: 45 kg    Examination:  General exam: Appears calm and comfortable  Respiratory system: Clear to auscultation. Respiratory effort normal. Cardiovascular system: normal S1 & S2 heard. No JVD, murmurs, rubs, gallops or clicks. No pedal edema. Gastrointestinal system: Abdomen is nondistended, soft and nontender. No organomegaly or masses felt. Normal bowel sounds heard. Central nervous system: Alert and oriented. No focal neurological deficits. Extremities: Symmetric 5 x 5 power. Skin: chronic decubitus ulcers  Psychiatry: Judgement and insight appear normal. Mood & affect appropriate.   Data Reviewed: I have personally reviewed following labs and imaging studies  CBC: Recent Labs  Lab 12/29/23 1301 12/29/23 2119 12/30/23 0421  WBC 15.5*  --  10.8*  NEUTROABS 13.7*  --  9.1*  HGB 5.1* 7.4* 7.7*  HCT 17.7* 23.7* 24.7*  MCV 98.9  --  92.9  PLT 345  --  352    Basic Metabolic Panel: Recent Labs  Lab 12/29/23 1301 12/30/23 0421  NA 134* 129*  K 3.6 4.1  CL 96* 91*  CO2 21* 21*  GLUCOSE 134* 116*  BUN 75* 80*  CREATININE 7.37* 7.69*  CALCIUM  8.4* 8.6*  PHOS  --  5.7*    CBG: No results for input(s): GLUCAP in the last 168 hours.  Recent Results (from the past 240 hours)  MRSA Next Gen by PCR, Nasal     Status: None   Collection Time: 12/29/23  3:00 PM   Specimen: Nasal Mucosa; Nasal Swab  Result Value Ref Range Status   MRSA by PCR Next Gen NOT DETECTED NOT DETECTED Final    Comment: (NOTE) The GeneXpert MRSA Assay (FDA approved for NASAL specimens only), is one component of a comprehensive MRSA colonization surveillance program. It is not intended to diagnose MRSA infection nor to guide or monitor treatment for MRSA infections. Test performance is not FDA approved in patients less than 64 years old. Performed at [facility], [address] [place], [place] [address]   Culture, blood (Routine X 2) w Reflex to ID Panel     Status: None (Preliminary result)    Collection Time: 12/29/23  9:18 PM   Specimen: BLOOD  Result Value Ref Range Status   Specimen Description BLOOD BLOOD RIGHT ARM  Final   Special Requests   Final    BOTTLES DRAWN AEROBIC AND ANAEROBIC Blood Culture adequate volume   Culture   Final    NO GROWTH < 12 HOURS Performed at [facility], [address] [place], [place] [address]    Report Status PENDING  Incomplete  Culture, blood (Routine X 2) w Reflex to ID Panel     Status: None (Preliminary result)   Collection Time: 12/29/23  9:19 PM   Specimen: BLOOD  Result Value Ref Range Status   Specimen Description BLOOD RIGHT FOREARM  Final   Special Requests   Final    BOTTLES DRAWN AEROBIC AND ANAEROBIC Blood Culture adequate volume   Culture   Final    NO GROWTH < 12 HOURS  Performed at [facility], [address], [place], [place] [address]    Report Status PENDING  Incomplete     Radiology Studies: DG CHEST PORT 1 VIEW Result Date: 12/30/2023 CLINICAL DATA:  Fever. EXAM: PORTABLE CHEST 1 VIEW COMPARISON:  Radiograph yesterday FINDINGS: The patient is rotated. Stable heart size and mediastinal contours. Small pleural effusions, increasing on the right. Worsening interstitial prominence in the lung bases. No pneumothorax. Thoracic lumbar fusion hardware. Remote right clavicle fracture. IMPRESSION: Small pleural effusions, increasing on the right. Worsening interstitial prominence in the lung bases, may be pulmonary edema or infection. Electronically Signed   By: Andrea Gasman M.D.   On: 12/30/2023 13:13   DG Chest Port 1 View Result Date: 12/29/2023 CLINICAL DATA:  Hypotension, shortness of breath. EXAM: PORTABLE CHEST 1 VIEW COMPARISON:  11/20/2023 and CT chest 11/20/2023. FINDINGS: Trachea is midline. Heart is enlarged, stable. Minimal basilar interstitial prominence. Trace bilateral pleural effusions. Biapical pleural thickening. IMPRESSION: Tiny bilateral pleural effusions. Difficult to exclude mild basilar pulmonary  edema. Electronically Signed   By: Newell Eke M.D.   On: 12/29/2023 13:36    Scheduled Meds:  [START ON 12/31/2023] calcitRIOL   1.75 mcg Oral Q M,W,F   Chlorhexidine  Gluconate Cloth  6 each Topical Q0600   [START ON 12/31/2023] cinacalcet   30 mg Oral Q M,W,F   feeding supplement  237 mL Oral TID BM   gabapentin   100 mg Oral BID   levETIRAcetam   1,000 mg Oral QPM   multivitamin  1 tablet Oral Daily   pantoprazole   40 mg Oral Daily   senna-docusate  2 tablet Oral QHS   sevelamer  carbonate  800 mg Oral TID WC   Continuous Infusions:  sodium chloride      norepinephrine  (LEVOPHED ) Adult infusion 2 mcg/min (12/30/23 1221)     LOS: 0 days   Critical Care Procedure Note Authorized and Performed by: KYM Louder MD  Total Critical Care time:  56 mins Due to a high probability of clinically significant, life threatening deterioration, the patient required my highest level of preparedness to intervene emergently and I personally spent this critical care time directly and personally managing the patient.  This critical care time included obtaining a history; examining the patient, pulse oximetry; ordering and review of studies; arranging urgent treatment with development of a management plan; evaluation of patient's response of treatment; frequent reassessment; and discussions with other providers.  This critical care time was performed to assess and manage the high probability of imminent and life threatening deterioration that could result in multi-organ failure.  It was exclusive of separately billable procedures and treating other patients and teaching time.    Afton Louder, MD How to contact the [facility] Attending or Consulting provider 7A - 7P or covering provider during after hours 7P -7A, for this patient?  Check the care team in [facility] and look for a) attending/consulting [facility] provider listed and b) the [facility] team listed Log into [email or web address] to find provider on call.  Locate the [facility] provider  you are looking for under [facility] and page to a number that you can be directly reached. If you still have difficulty reaching the provider, please page the [facility] (Director on Call) for the Hospitalists listed on amion for assistance.  12/30/2023, 1:58 PM

## 2023-12-30 NOTE — Plan of Care (Signed)

## 2023-12-31 ENCOUNTER — Encounter (HOSPITAL_COMMUNITY): Payer: Self-pay | Admitting: Family Medicine

## 2023-12-31 ENCOUNTER — Inpatient Hospital Stay (HOSPITAL_COMMUNITY)

## 2023-12-31 DIAGNOSIS — M009 Pyogenic arthritis, unspecified: Secondary | ICD-10-CM | POA: Diagnosis not present

## 2023-12-31 DIAGNOSIS — R6510 Systemic inflammatory response syndrome (SIRS) of non-infectious origin without acute organ dysfunction: Secondary | ICD-10-CM

## 2023-12-31 DIAGNOSIS — D649 Anemia, unspecified: Secondary | ICD-10-CM | POA: Diagnosis not present

## 2023-12-31 LAB — URINALYSIS, W/ REFLEX TO CULTURE (INFECTION SUSPECTED)
Bilirubin Urine: NEGATIVE
Glucose, UA: 100 mg/dL — AB
Ketones, ur: NEGATIVE mg/dL
Nitrite: NEGATIVE
Protein, ur: >300 mg/dL — AB
Specific Gravity, Urine: 1.02 (ref 1.005–1.030)
WBC, UA: >50 WBC/hpf (ref 0–5)
pH: 8.5 — ABNORMAL HIGH (ref 5.0–8.0)

## 2023-12-31 LAB — RENAL FUNCTION PANEL
Albumin: 2.2 g/dL — ABNORMAL LOW (ref 3.5–5.0)
Anion gap: 14 (ref 5–15)
BUN: 38 mg/dL — ABNORMAL HIGH (ref 6–20)
CO2: 27 mmol/L (ref 22–32)
Calcium: 8.6 mg/dL — ABNORMAL LOW (ref 8.9–10.3)
Chloride: 96 mmol/L — ABNORMAL LOW (ref 98–111)
Creatinine, Ser: 3.8 mg/dL — ABNORMAL HIGH (ref 0.44–1.00)
GFR, Estimated: 13 mL/min — ABNORMAL LOW (ref >60)
Glucose, Bld: 105 mg/dL — ABNORMAL HIGH (ref 70–99)
Phosphorus: 2.9 mg/dL (ref 2.5–4.6)
Potassium: 4 mmol/L (ref 3.5–5.1)
Sodium: 137 mmol/L (ref 135–145)

## 2023-12-31 LAB — CBC WITH DIFFERENTIAL/PLATELET
Abs Immature Granulocytes: 0.08 K/uL — ABNORMAL HIGH (ref 0.00–0.07)
Basophils Absolute: 0 K/uL (ref 0.0–0.1)
Basophils Relative: 1 %
Eosinophils Absolute: 0.1 K/uL (ref 0.0–0.5)
Eosinophils Relative: 1 %
HCT: 25 % — ABNORMAL LOW (ref 36.0–46.0)
Hemoglobin: 7.5 g/dL — ABNORMAL LOW (ref 12.0–15.0)
Immature Granulocytes: 1 %
Lymphocytes Relative: 11 %
Lymphs Abs: 0.9 K/uL (ref 0.7–4.0)
MCH: 28.3 pg (ref 26.0–34.0)
MCHC: 30 g/dL (ref 30.0–36.0)
MCV: 94.3 fL (ref 80.0–100.0)
Monocytes Absolute: 0.9 K/uL (ref 0.1–1.0)
Monocytes Relative: 11 %
Neutro Abs: 6.2 K/uL (ref 1.7–7.7)
Neutrophils Relative %: 75 %
Platelets: 329 K/uL (ref 150–400)
RBC: 2.65 MIL/uL — ABNORMAL LOW (ref 3.87–5.11)
RDW: 16.9 % — ABNORMAL HIGH (ref 11.5–15.5)
WBC: 8.2 K/uL (ref 4.0–10.5)
nRBC: 0 % (ref 0.0–0.2)

## 2023-12-31 LAB — HEPATITIS B SURFACE ANTIBODY, QUANTITATIVE: Hep B S AB Quant (Post): 321 m[IU]/mL

## 2023-12-31 MED ORDER — VANCOMYCIN HCL 750 MG/150ML IV SOLN
750.0000 mg | Freq: Once | INTRAVENOUS | Status: AC
  Administered 2023-12-31: 750 mg via INTRAVENOUS
  Filled 2023-12-31: qty 150

## 2023-12-31 MED ORDER — VANCOMYCIN HCL 500 MG/100ML IV SOLN: 500.0000 mg | INTRAVENOUS | Status: DC

## 2023-12-31 MED ORDER — SODIUM CHLORIDE 0.9 % IV SOLN: 2.0000 g | INTRAVENOUS | Status: DC

## 2023-12-31 MED ORDER — DAKINS (1/4 STRENGTH) 0.125 % EX SOLN
Freq: Every day | CUTANEOUS | Status: DC
  Filled 2023-12-31: qty 473

## 2023-12-31 MED ORDER — SODIUM CHLORIDE 0.9 % IV SOLN
2.0000 g | INTRAVENOUS | Status: DC
  Administered 2023-12-31: 2 g via INTRAVENOUS
  Filled 2023-12-31: qty 20

## 2023-12-31 MED ORDER — ZINC OXIDE 40 % EX OINT
TOPICAL_OINTMENT | Freq: Two times a day (BID) | CUTANEOUS | Status: DC
  Filled 2023-12-31: qty 57

## 2023-12-31 MED ORDER — IOHEXOL 300 MG/ML  SOLN
80.0000 mL | Freq: Once | INTRAMUSCULAR | Status: AC | PRN
  Administered 2023-12-31: 80 mL via INTRAVENOUS

## 2023-12-31 NOTE — Progress Notes (Signed)
 Pt. Discharged in stable condition via stretcher with [facility]. All belongings sent with patient and [facility] staff members. No questions or concerns voiced or expressed from patient a this time.

## 2023-12-31 NOTE — Plan of Care (Signed)

## 2023-12-31 NOTE — Progress Notes (Signed)
 Gave report to [employer], Charity fundraiser at [facility]. [facility] called and said they would arrive in approximately 20 minutes to pick up patient.

## 2023-12-31 NOTE — Progress Notes (Addendum)
 8/6 CT pelvis--Fluid and gas involving the left hip joint with lucency involving the proximal left femur. 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 --blood cultures repeated due to fever 101.1 --started vanc and ceftriaxone  --Called [facility]--spoke with Dr. Laveta Blank --Dr. Blank accepted patient in transfer --patient and daughter notified of CT findings and transfer to [facility] for definitive care of left hip septic arthritis. --pt is hemodynamically stable without tachycardia presently, does not need stepdown--can downgrade to med surg  Christina Jadrian Bulman, DO

## 2023-12-31 NOTE — Progress Notes (Signed)
 [name] is an 58 y.o. female  with traumatic brain injury, paraplegia, decubitus ulcers, chronic indwelling suprapubic Foley, chronic anemia, hypertension, uterine cancer, history of seizures followed by wound care, ESRD on dialysis Monday Wednesday and Fridays at [facility] here with generalized weakness and hypotension as well as DOE.   Found to have a hemoglobin of 5.1, given 2 units.  Outpatient dialysis prescription: MWF, 3hr , EDW 45kg, LUE AVF, Elisio 17h, BFR 400, DFR 500, 2/2.5 bath, UF profile 2, mircera q2 weeks on 7/28, calcitriol  1.75mcg w/ treatments, sensipar  30mg  with each treatment   Assessment/Plan: # ESRD: Missed HD on Mon and rx tx late afternoon Tuesday for 3.5 hours and 2 L net UF.  Plan initially was to dialyze today and get back on her schedule but due to staffing and multiple emergent dialysis needs we will dialyze Thursday instead.  No absolute indication for dialysis today.  # Volume/ hypertension: does not appear grossly volume overloaded.  Blood pressure fairly low.not currently on any blood pressure medications   # Anemia of Chronic Kidney Disease: due for esa again on 8/11. No iron  given transfusions. Acute anemia as below   # Secondary Hyperparathyroidism/Hyperphosphatemia: Cont home calcitriol  and sensipar . Obtain phos level and cont home binders   # Vascular access: No issues with AVF   # Acute symptomatic anemia: No obvious overt blood loss but hard to think hemoglobin would be this low simply from anemia of chronic disease.  Transfusion per primary team.  Workup for blood loss per primary team    # Additional recommendations: - Dose all meds for creatinine clearance < 10 ml/min  - Unless absolutely necessary, no MRIs with gadolinium.  - Implement save arm precautions.  Prefer needle sticks in the dorsum of the hands or wrists.  No blood pressure measurements in arm. - If blood transfusion is requested during hemodialysis  sessions, please alert us  prior to the session.  - Use synthetic opioids (Fentanyl /Dilaudid ) if needed  Subjective: Denies any fever, chills, shortness of breath, nausea.  Has an appetite.  Tolerated dialysis yesterday with no cramping or dizziness.   Chemistry and CBC: Creat  Date/Time Value Ref Range Status  04/18/2015 02:24 PM 0.76 0.50 - 1.10 mg/dL Final   Creatinine, Ser  Date/Time Value Ref Range Status  12/31/2023 04:44 AM 3.80 (H) 0.44 - 1.00 mg/dL Final    Comment:    DELTA CHECK NOTED  12/30/2023 04:21 AM 7.69 (H) 0.44 - 1.00 mg/dL Final  91/95/7974 98:98 PM 7.37 (H) 0.44 - 1.00 mg/dL Final  92/89/7974 88:53 AM 3.67 (H) 0.57 - 1.00 mg/dL Final  93/69/7974 95:61 AM 5.56 (H) 0.44 - 1.00 mg/dL Final  93/72/7974 94:80 AM 4.85 (H) 0.44 - 1.00 mg/dL Final  93/73/7974 87:49 PM 3.77 (H) 0.44 - 1.00 mg/dL Final  94/94/7974 91:95 AM 6.40 (H) 0.44 - 1.00 mg/dL Final  94/96/7974 92:96 AM 6.73 (H) 0.44 - 1.00 mg/dL Final  94/97/7974 90:78 AM 5.39 (H) 0.44 - 1.00 mg/dL Final    Comment:    DELTA CHECK NOTED  09/24/2023 03:50 AM 3.31 (H) 0.44 - 1.00 mg/dL Final  95/70/7974 95:89 AM 4.10 (H) 0.44 - 1.00 mg/dL Final  95/71/7974 89:81 AM 8.30 (H) 0.44 - 1.00 mg/dL Final  95/71/7974 89:89 AM 7.51 (H) 0.44 - 1.00 mg/dL Final  97/82/7974 96:63 PM 4.62 (H) 0.44 - 1.00 mg/dL Final    Comment:    DELTA CHECK NOTED  07/13/2023 04:45 AM 3.02 (H) 0.44 - 1.00  mg/dL Final  97/84/7974 95:45 PM 5.02 (H) 0.44 - 1.00 mg/dL Final    Comment:    DELTA CHECK NOTED  07/11/2023 04:04 AM 2.73 (H) 0.44 - 1.00 mg/dL Final  97/86/7974 94:50 AM 3.66 (H) 0.44 - 1.00 mg/dL Final  97/97/7974 95:65 AM 4.95 (H) 0.44 - 1.00 mg/dL Final  97/98/7974 91:88 PM 4.77 (H) 0.44 - 1.00 mg/dL Final  98/75/7974 89:78 AM 4.77 (H) 0.44 - 1.00 mg/dL Final  98/84/7974 94:86 AM 3.68 (H) 0.44 - 1.00 mg/dL Final  98/85/7974 88:73 AM 7.14 (H) 0.44 - 1.00 mg/dL Final  98/85/7974 90:58 AM 6.97 (H) 0.44 - 1.00 mg/dL Final   87/72/7975 95:56 AM 2.88 (H) 0.44 - 1.00 mg/dL Final  87/73/7975 95:52 AM 4.72 (H) 0.44 - 1.00 mg/dL Final  87/74/7975 95:49 AM 3.77 (H) 0.44 - 1.00 mg/dL Final  87/75/7975 95:54 PM 5.34 (H) 0.44 - 1.00 mg/dL Final  87/81/7975 87:59 AM 5.31 (H) 0.44 - 1.00 mg/dL Final  87/90/7975 97:97 PM 5.72 (H) 0.44 - 1.00 mg/dL Final  87/92/7975 95:83 AM 5.65 (H) 0.44 - 1.00 mg/dL Final  87/93/7975 89:91 AM 5.07 (H) 0.44 - 1.00 mg/dL Final  88/81/7975 95:79 AM 5.81 (H) 0.44 - 1.00 mg/dL Final  88/82/7975 94:82 PM 5.82 (H) 0.44 - 1.00 mg/dL Final  88/94/7975 94:64 AM 3.54 (H) 0.44 - 1.00 mg/dL Final  88/95/7975 90:96 AM 5.55 (H) 0.44 - 1.00 mg/dL Final  89/77/7975 95:95 AM 3.29 (H) 0.44 - 1.00 mg/dL Final  89/78/7975 89:97 AM 5.57 (H) 0.44 - 1.00 mg/dL Final  89/89/7975 94:99 AM 2.72 (H) 0.44 - 1.00 mg/dL Final  89/90/7975 95:77 PM 1.87 (H) 0.44 - 1.00 mg/dL Final  91/93/7975 89:98 AM 3.43 (H) 0.44 - 1.00 mg/dL Final  92/75/7975 97:48 PM 6.66 (H) 0.44 - 1.00 mg/dL Final    Comment:    DELTA CHECK NOTED  12/17/2022 03:17 AM 4.97 (H) 0.44 - 1.00 mg/dL Final  92/77/7975 95:40 AM 4.04 (H) 0.44 - 1.00 mg/dL Final  92/78/7975 95:64 AM 6.58 (H) 0.44 - 1.00 mg/dL Final  92/79/7975 95:54 AM 6.12 (H) 0.44 - 1.00 mg/dL Final  92/80/7975 90:99 PM 5.86 (H) 0.44 - 1.00 mg/dL Final  92/90/7975 89:60 AM 7.10 (H) 0.44 - 1.00 mg/dL Final  93/81/7975 95:75 AM 2.92 (H) 0.44 - 1.00 mg/dL Final  93/82/7975 94:85 AM 5.12 (H) 0.44 - 1.00 mg/dL Final  93/85/7975 95:75 PM 1.95 (H) 0.44 - 1.00 mg/dL Final    Comment:    DELTA CHECK NOTED   Recent Labs  Lab 12/29/23 1301 12/30/23 0421 12/31/23 0444  NA 134* 129* 137  K 3.6 4.1 4.0  CL 96* 91* 96*  CO2 21* 21* 27  GLUCOSE 134* 116* 105*  BUN 75* 80* 38*  CREATININE 7.37* 7.69* 3.80*  CALCIUM  8.4* 8.6* 8.6*  PHOS  --  5.7* 2.9   Recent Labs  Lab 12/29/23 1301 12/29/23 2119 12/30/23 0421 12/31/23 0444  WBC 15.5*  --  10.8* 8.2  NEUTROABS 13.7*  --  9.1*  6.2  HGB 5.1* 7.4* 7.7* 7.5*  HCT 17.7* 23.7* 24.7* 25.0*  MCV 98.9  --  92.9 94.3  PLT 345  --  352 329   Liver Function Tests: Recent Labs  Lab 12/30/23 0421 12/31/23 0444  ALBUMIN  1.8* 2.2*   No results for input(s): LIPASE, AMYLASE in the last 168 hours. No results for input(s): AMMONIA in the last 168 hours. Cardiac Enzymes: No results for input(s): CKTOTAL, CKMB, CKMBINDEX, TROPONINI in the last  168 hours. Iron  Studies:  Recent Labs    12/29/23 1301  IRON  9*  TIBC 104*  FERRITIN 3,152*   PT/INR: @LABRCNTIP (inr:5)  Xrays/Other Studies: ) Results for orders placed or performed during the hospital encounter of 12/29/23 (from the past 48 hours)  CBC with Differential     Status: Abnormal   Collection Time: 12/29/23  1:01 PM  Result Value Ref Range   WBC 15.5 (H) 4.0 - 10.5 K/uL   RBC 1.79 (L) 3.87 - 5.11 MIL/uL   Hemoglobin 5.1 (LL) 12.0 - 15.0 g/dL    Comment: REPEATED TO VERIFY This result has been called to [facility] by [id number] on 12/29/2023 13:16:47, and has been read back.    HCT 17.7 (L) 36.0 - 46.0 %   MCV 98.9 80.0 - 100.0 fL   MCH 28.5 26.0 - 34.0 pg   MCHC 28.8 (L) 30.0 - 36.0 g/dL   RDW 82.5 (H) 88.4 - 84.4 %   Platelets 345 150 - 400 K/uL   nRBC 0.0 0.0 - 0.2 %   Neutrophils Relative % 89 %   Neutro Abs 13.7 (H) 1.7 - 7.7 K/uL   Lymphocytes Relative 4 %   Lymphs Abs 0.7 0.7 - 4.0 K/uL   Monocytes Relative 6 %   Monocytes Absolute 0.9 0.1 - 1.0 K/uL   Eosinophils Relative 0 %   Eosinophils Absolute 0.1 0.0 - 0.5 K/uL   Basophils Relative 0 %   Basophils Absolute 0.0 0.0 - 0.1 K/uL   Immature Granulocytes 1 %   Abs Immature Granulocytes 0.16 (H) 0.00 - 0.07 K/uL    Comment: Performed at [facility], [address], [place], [place] [address]  Basic metabolic panel     Status: Abnormal   Collection Time: 12/29/23  1:01 PM  Result Value Ref Range   Sodium 134 (L) 135 - 145 mmol/L   Potassium 3.6 3.5 - 5.1 mmol/L   Chloride 96  (L) 98 - 111 mmol/L   CO2 21 (L) 22 - 32 mmol/L   Glucose, Bld 134 (H) 70 - 99 mg/dL    Comment: Glucose reference range applies only to samples taken after fasting for at least 8 hours.   BUN 75 (H) 6 - 20 mg/dL   Creatinine, Ser 2.62 (H) 0.44 - 1.00 mg/dL   Calcium  8.4 (L) 8.9 - 10.3 mg/dL   GFR, Estimated 6 (L) >60 mL/min    Comment: (NOTE) Calculated using the CKD-EPI Creatinine Equation (2021)    Anion gap 17 (H) 5 - 15    Comment: Performed at [facility], [address], [place], [place] [address]  Vitamin B12     Status: None   Collection Time: 12/29/23  1:01 PM  Result Value Ref Range   Vitamin B-12 331 180 - 914 pg/mL    Comment: (NOTE) This assay is not validated for testing neonatal or myeloproliferative syndrome specimens for Vitamin B12 levels. Performed at [facility], [address], [place], [place] [address]   Folate     Status: None   Collection Time: 12/29/23  1:01 PM  Result Value Ref Range   Folate >40.0 >5.9 ng/mL    Comment: RESULTS CONFIRMED BY MANUAL DILUTION Performed at [facility], [address] [place], [place] [address]   Iron  and TIBC     Status: Abnormal   Collection Time: 12/29/23  1:01 PM  Result Value Ref Range   Iron  9 (L) 28 - 170 ug/dL   TIBC 895 (L)  250 - 450 ug/dL   Saturation Ratios 9 (L) 10.4 - 31.8 %   UIBC 95 ug/dL    Comment: Performed at [facility], [address] [place], [place] [address]  Ferritin     Status: Abnormal   Collection Time: 12/29/23  1:01 PM  Result Value Ref Range   Ferritin 3,152 (H) 11 - 307 ng/mL    Comment: Performed at [facility], [address] [place], [place] [address]  Reticulocytes     Status: Abnormal   Collection Time: 12/29/23  1:01 PM  Result Value Ref Range   Retic Ct Pct 1.8 0.4 - 3.1 %   RBC. 1.78 (L) 3.87 - 5.11 MIL/uL   Retic Count, Absolute 32.4 19.0 - 186.0 K/uL   Immature Retic Fract 28.9 (H) 2.3 - 15.9 %    Comment: Performed at [facility], [address], [place],  [place] [address]  Type and screen [facility]     Status: None   Collection Time: 12/29/23  1:49 PM  Result Value Ref Range   ABO/RH(D) A POS    Antibody Screen NEG    Sample Expiration 01/01/2024,2359    Unit Number [id number]    Blood Component Type RBC LR PHER1    Unit division 00    Status of Unit ISSUED,FINAL    Transfusion Status OK TO TRANSFUSE    Crossmatch Result Compatible    Unit Number [id number]    Blood Component Type RED CELLS,LR    Unit division 00    Status of Unit ISSUED,FINAL    Transfusion Status OK TO TRANSFUSE    Crossmatch Result      Compatible Performed at [facility], [address], [place], [place] [address]   Prepare RBC (crossmatch)     Status: None   Collection Time: 12/29/23  1:49 PM  Result Value Ref Range   Order Confirmation      ORDER PROCESSED BY BLOOD BANK Performed at [facility], [address], [place], [place] [address]   MRSA Next Gen by PCR, Nasal     Status: None   Collection Time: 12/29/23  3:00 PM   Specimen: Nasal Mucosa; Nasal Swab  Result Value Ref Range   MRSA by PCR Next Gen NOT DETECTED NOT DETECTED    Comment: (NOTE) The GeneXpert MRSA Assay (FDA approved for NASAL specimens only), is one component of a comprehensive MRSA colonization surveillance program. It is not intended to diagnose MRSA infection nor to guide or monitor treatment for MRSA infections. Test performance is not FDA approved in patients less than 46 years old. Performed at [facility], [address], [place], [place] [address]   Culture, blood (Routine X 2) w Reflex to ID Panel     Status: None (Preliminary result)   Collection Time: 12/29/23  9:18 PM   Specimen: BLOOD  Result Value Ref Range   Specimen Description BLOOD BLOOD RIGHT ARM    Special Requests      BOTTLES DRAWN AEROBIC AND ANAEROBIC Blood Culture adequate volume   Culture      NO GROWTH 2 DAYS Performed at [facility], [address] [place], [place] [address]    Report  Status PENDING   Culture, blood (Routine X 2) w Reflex to ID Panel     Status: None (Preliminary result)   Collection Time: 12/29/23  9:19 PM   Specimen: BLOOD  Result Value Ref Range   Specimen Description BLOOD RIGHT FOREARM    Special  Requests      BOTTLES DRAWN AEROBIC AND ANAEROBIC Blood Culture adequate volume   Culture      NO GROWTH 2 DAYS Performed at [facility], [address] [place], [place] [address]    Report Status PENDING   Hemoglobin and hematocrit, blood     Status: Abnormal   Collection Time: 12/29/23  9:19 PM  Result Value Ref Range   Hemoglobin 7.4 (L) 12.0 - 15.0 g/dL   HCT 76.2 (L) 63.9 - 53.9 %    Comment: Performed at [facility], [address], [place], [place] [address]  Hepatitis B surface antigen     Status: None   Collection Time: 12/30/23  4:21 AM  Result Value Ref Range   Hepatitis B Surface Ag NON REACTIVE NON REACTIVE    Comment: Performed at [facility], [address] [place], [place] [address]  Hepatitis B surface antibody,quantitative     Status: None   Collection Time: 12/30/23  4:21 AM  Result Value Ref Range   Hep B S AB Quant (Post) 321.0 Immunity>10 mIU/mL    Comment: (NOTE)  Status of Immunity                     Anti-HBs Level  ------------------                     -------------- Inconsistent with Immunity                  0.0 - 10.0 Consistent with Immunity                         >10.0 Performed At: [facility] [address] [place], [place] [id number] Jennette Shorter MD Ey:[id number]   Renal function panel     Status: Abnormal   Collection Time: 12/30/23  4:21 AM  Result Value Ref Range   Sodium 129 (L) 135 - 145 mmol/L   Potassium 4.1 3.5 - 5.1 mmol/L   Chloride 91 (L) 98 - 111 mmol/L   CO2 21 (L) 22 - 32 mmol/L   Glucose, Bld 116 (H) 70 - 99 mg/dL    Comment: Glucose reference range applies only to samples taken after fasting for at least 8 hours.   BUN 80 (H) 6 - 20 mg/dL   Creatinine, Ser 2.30 (H)  0.44 - 1.00 mg/dL   Calcium  8.6 (L) 8.9 - 10.3 mg/dL   Phosphorus 5.7 (H) 2.5 - 4.6 mg/dL   Albumin  1.8 (L) 3.5 - 5.0 g/dL   GFR, Estimated 6 (L) >60 mL/min    Comment: (NOTE) Calculated using the CKD-EPI Creatinine Equation (2021)    Anion gap 17 (H) 5 - 15    Comment: Performed at [facility], [address], [place], [place] [address]  CBC with Differential/Platelet     Status: Abnormal   Collection Time: 12/30/23  4:21 AM  Result Value Ref Range   WBC 10.8 (H) 4.0 - 10.5 K/uL   RBC 2.66 (L) 3.87 - 5.11 MIL/uL   Hemoglobin 7.7 (L) 12.0 - 15.0 g/dL   HCT 75.2 (L) 63.9 - 53.9 %   MCV 92.9 80.0 - 100.0 fL   MCH 28.9 26.0 - 34.0 pg   MCHC 31.2 30.0 - 36.0 g/dL   RDW 82.9 (H) 88.4 - 84.4 %   Platelets 352 150 - 400 K/uL   nRBC 0.0 0.0 - 0.2 %   Neutrophils Relative %  84 %   Neutro Abs 9.1 (H) 1.7 - 7.7 K/uL   Lymphocytes Relative 7 %   Lymphs Abs 0.8 0.7 - 4.0 K/uL   Monocytes Relative 6 %   Monocytes Absolute 0.6 0.1 - 1.0 K/uL   Eosinophils Relative 2 %   Eosinophils Absolute 0.2 0.0 - 0.5 K/uL   Basophils Relative 0 %   Basophils Absolute 0.0 0.0 - 0.1 K/uL   Immature Granulocytes 1 %   Abs Immature Granulocytes 0.10 (H) 0.00 - 0.07 K/uL    Comment: Performed at [facility], [address] [place], [place] [address]  Renal function panel     Status: Abnormal   Collection Time: 12/31/23  4:44 AM  Result Value Ref Range   Sodium 137 135 - 145 mmol/L    Comment: DELTA CHECK NOTED   Potassium 4.0 3.5 - 5.1 mmol/L   Chloride 96 (L) 98 - 111 mmol/L   CO2 27 22 - 32 mmol/L   Glucose, Bld 105 (H) 70 - 99 mg/dL    Comment: Glucose reference range applies only to samples taken after fasting for at least 8 hours.   BUN 38 (H) 6 - 20 mg/dL   Creatinine, Ser 6.19 (H) 0.44 - 1.00 mg/dL    Comment: DELTA CHECK NOTED   Calcium  8.6 (L) 8.9 - 10.3 mg/dL   Phosphorus 2.9 2.5 - 4.6 mg/dL   Albumin  2.2 (L) 3.5 - 5.0 g/dL   GFR, Estimated 13 (L) >60 mL/min    Comment:  (NOTE) Calculated using the CKD-EPI Creatinine Equation (2021)    Anion gap 14 5 - 15    Comment: Performed at [facility], [address] [place], [place] [address]  CBC with Differential/Platelet     Status: Abnormal   Collection Time: 12/31/23  4:44 AM  Result Value Ref Range   WBC 8.2 4.0 - 10.5 K/uL   RBC 2.65 (L) 3.87 - 5.11 MIL/uL   Hemoglobin 7.5 (L) 12.0 - 15.0 g/dL   HCT 74.9 (L) 63.9 - 53.9 %   MCV 94.3 80.0 - 100.0 fL   MCH 28.3 26.0 - 34.0 pg   MCHC 30.0 30.0 - 36.0 g/dL   RDW 83.0 (H) 88.4 - 84.4 %   Platelets 329 150 - 400 K/uL   nRBC 0.0 0.0 - 0.2 %   Neutrophils Relative % 75 %   Neutro Abs 6.2 1.7 - 7.7 K/uL   Lymphocytes Relative 11 %   Lymphs Abs 0.9 0.7 - 4.0 K/uL   Monocytes Relative 11 %   Monocytes Absolute 0.9 0.1 - 1.0 K/uL   Eosinophils Relative 1 %   Eosinophils Absolute 0.1 0.0 - 0.5 K/uL   Basophils Relative 1 %   Basophils Absolute 0.0 0.0 - 0.1 K/uL   Immature Granulocytes 1 %   Abs Immature Granulocytes 0.08 (H) 0.00 - 0.07 K/uL    Comment: Performed at [facility], [address] [place], [place] [address]   DG CHEST PORT 1 VIEW Result Date: 12/30/2023 CLINICAL DATA:  Fever. EXAM: PORTABLE CHEST 1 VIEW COMPARISON:  Radiograph yesterday FINDINGS: The patient is rotated. Stable heart size and mediastinal contours. Small pleural effusions, increasing on the right. Worsening interstitial prominence in the lung bases. No pneumothorax. Thoracic lumbar fusion hardware. Remote right clavicle fracture. IMPRESSION: Small pleural effusions, increasing on the right. Worsening interstitial prominence in the lung bases, may be pulmonary edema or infection. Electronically Signed   By: Andrea Gasman M.D.   On: 12/30/2023  13:13   DG Chest Port 1 View Result Date: 12/29/2023 CLINICAL DATA:  Hypotension, shortness of breath. EXAM: PORTABLE CHEST 1 VIEW COMPARISON:  11/20/2023 and CT chest 11/20/2023. FINDINGS: Trachea is midline. Heart is enlarged, stable.  Minimal basilar interstitial prominence. Trace bilateral pleural effusions. Biapical pleural thickening. IMPRESSION: Tiny bilateral pleural effusions. Difficult to exclude mild basilar pulmonary edema. Electronically Signed   By: Newell Eke M.D.   On: 12/29/2023 13:36    PMH:   Past Medical History:  Diagnosis Date   Abnormal uterine bleeding (AUB) 06/15/2014   Cancer (HCC)    uterine   ESRD on hemodialysis (HCC)    High blood pressure    Paraplegia (lower)    Seizure disorder (HCC)    Seizures (HCC)    Suprapubic catheter (HCC)    Urinary tract infection     PSH:   Past Surgical History:  Procedure Laterality Date   APPLICATION OF WOUND VAC Right 09/13/2021   (approximately 1-65mos ago) pressure sore on right hip   BACK SURGERY     Pt stated before 2000   BIOPSY  12/03/2022   Procedure: BIOPSY;  Surgeon: [name], MD;  [facility]: [facility];  Service: Gastroenterology;;   ESOPHAGOGASTRODUODENOSCOPY N/A 09/20/2015   Procedure: ESOPHAGOGASTRODUODENOSCOPY (EGD);  Surgeon: [name], MD;  Location: [facility];  Service: Endoscopy;  Laterality: N/A;  730   ESOPHAGOGASTRODUODENOSCOPY (EGD) WITH PROPOFOL  N/A 12/03/2022   Procedure: ESOPHAGOGASTRODUODENOSCOPY (EGD) WITH PROPOFOL ;  Surgeon: [name], MD;  Location: [facility];  Service: Gastroenterology;  Laterality: N/A;  1:15 pm, asa 3, pt knows to arrive at 10:30  dialysis pt, M,W & F   IR CATHETER TUBE CHANGE  04/02/2018   PERCUTANEOUS ENDOSCOPIC GASTROSTOMY (PEG) REMOVAL N/A 09/20/2015   Procedure: PERCUTANEOUS ENDOSCOPIC GASTROSTOMY (PEG) REMOVAL;  Surgeon: [name], MD;  Location: [facility];  Service: Endoscopy;  Laterality: N/A;   TEE WITHOUT CARDIOVERSION N/A 11/11/2022   Procedure: TRANSESOPHAGEAL ECHOCARDIOGRAM (TEE);  Surgeon: [name], MD;  Location: [facility];  Service: Cardiovascular;  Laterality: N/A;    Allergies:  Allergies  Allergen Reactions    Cefepime  Other (See Comments)    Severe AMS 09/2023 admission   Linezolid Other (See Comments)    Patient self-discontinued treatment due to GI intolerance. Taking it along with moxifloxacin   Moxifloxacin Other (See Comments)    Patient self-discontinued treatment due to GI intolerance. Taking it along with linezolid   Quinine Derivatives Other (See Comments)    Alters mental status   Vancomycin  Other (See Comments)    Unknown  Pt is tolerating this medication at HD   Azithromycin  Itching and Rash   Benadryl [Diphenhydramine Hcl (Sleep)] Hives   Daptomycin Hives   Tetracycline Itching    Able to tolerate Doxycycline .    Zosyn  [Piperacillin  Sod-Tazobactam So] Rash    Medications:   Prior to Admission medications   Medication Sig Start Date End Date Taking? Authorizing Provider  acetaminophen  (TYLENOL ) 500 MG tablet Take 1,000 mg by mouth every 6 (six) hours as needed for moderate pain.   Yes [provider]  albuterol  (PROVENTIL ) (2.5 MG/3ML) 0.083% nebulizer solution Take 3 mLs (2.5 mg total) by nebulization every 4 (four) hours as needed for wheezing or shortness of breath. 04/15/23 04/14/24 Yes Emokpae, Courage, MD  amLODipine  (NORVASC ) 10 MG tablet Take 1 tablet (10 mg total) by mouth every evening. Take in the evenings -- 11/24/23  Yes Pearlean Manus, MD  carvedilol  (COREG ) 12.5 MG tablet Take 1 tablet (12.5 mg total) by mouth 2 (two) times daily with a meal. 11/24/23  Yes Emokpae, Courage, MD  cloNIDine  (CATAPRES  - DOSED IN MG/24 HR) 0.1 mg/24hr patch Place 1 patch (0.1 mg total) onto the skin once a week. 11/24/23  Yes Pearlean Manus, MD  cyclobenzaprine  (FLEXERIL ) 5 MG tablet Take 1 tablet (5 mg total) by mouth 2 (two) times daily as needed for muscle spasms. 11/24/23  Yes Pearlean Manus, MD  furosemide  (LASIX ) 80 MG tablet Take 1 tablet (80 mg total) by mouth every Tuesday, Thursday, Saturday, and Sunday. 09/25/23  Yes Pearlean Manus, MD  gabapentin  (NEURONTIN ) 100 MG  capsule Take 1 capsule (100 mg total) by mouth 2 (two) times daily. 12/11/23  Yes [name], [name], FNP  irbesartan  (AVAPRO ) 150 MG tablet Take 1 tablet (150 mg total) by mouth daily. Take 150 mg by mouth daily. 08/07/23  Yes [name], [name], FNP  levETIRAcetam  (KEPPRA ) 500 MG tablet Take 2 tablets (1,000 mg total) by mouth every evening. Take an additional tablet on Monday,Wednesday and Friday evening after Dialysis 11/24/23  Yes Emokpae, Courage, MD  lidocaine -prilocaine  (EMLA ) cream Apply 1 Application topically 3 (three) times a week. 06/04/22  Yes [provider]  multivitamin (RENA-VIT) TABS tablet Take 1 tablet by mouth daily. 07/29/23  Yes [provider]  pantoprazole  (PROTONIX ) 40 MG tablet Take 1 tablet (40 mg total) by mouth daily. 11/24/23  Yes Emokpae, Courage, MD  senna-docusate (SENOKOT-S) 8.6-50 MG tablet Take 2 tablets by mouth at bedtime. 11/24/23  Yes Pearlean Manus, MD  sevelamer  carbonate (RENVELA ) 800 MG tablet Take 800 mg by mouth 3 (three) times daily. 12/11/22  Yes [provider]  vancomycin  (VANCOREADY) 500 MG/100ML IVPB Inject 100 mLs (500 mg total) into the vein every Monday, Wednesday, and Friday with hemodialysis. -iv Vancomycin  500 mg on MWF with Hemodialysis for 4 weeks --(Last Dose 12/24/23) 11/24/23  Yes Emokpae, Courage, MD    Discontinued Meds:   Medications Discontinued During This Encounter  Medication Reason   collagenase  (SANTYL ) 250 UNIT/GM ointment Patient Preference   vancomycin  (VANCOREADY) IVPB 500 mg/100 mL    norepinephrine  (LEVOPHED ) 4mg  in (0.016 mg/mL) premix infusion Inpatient Standard    Social History:  reports that she has never smoked. She has never used smokeless tobacco. She reports current alcohol use. She reports that she does not use drugs.  Family History:   Family History  Problem Relation Age of Onset   Cancer Mother    Hypertension Mother    Cancer Sister        breast and then spread everywhere.    Diabetes Paternal Grandmother    Hypertension Paternal Grandmother     Blood pressure (!) 146/59, pulse (!) 112, temperature (!) 100.7 F (38.2 C), temperature source Axillary, resp. rate (!) 23, height 5' 2 (1.575 m), weight 43 kg, SpO2 92%. Physical Exam: General: NAD HEENT: anicteric sclera CV: RRR Lungs: poor air but no rales, normal wob Abd: soft, non-tender, non-distended Skin: no visible lesions or rashes Psych: alert, engaged, appropriate mood and affect Neuro: normal speech, paraplegia present, otherwise no specific deficits Access: Upper arm fistula with a very nice thrill along the inflow    MELIA LYNWOOD ORN, MD 12/31/2023, 9:19 AM

## 2023-12-31 NOTE — Progress Notes (Signed)
 Per wound nurse they planned on coming tomorrow to change her wound vac. Pt had a large loose bowel movent and vacuum was heavily soiled. New foam, dressing, and line applied to the wound and hooked back up to her home wound vac. One of our hospital vacuums was ordered but has not arrived yet. Suction is on and foam is compressed.
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
## 2023-12-31 NOTE — Discharge Summary (Signed)
 Physician Discharge Summary   Patient: [name] MRN: [id number] DOB: 02-Nov-1965  Admit date:     12/29/2023  Discharge date: 12/31/23  Discharge Physician: Alm Marques Ericson   PCP: [name], [name], FNP   Recommendations at discharge:   Please follow up with primary care provider within 1-2 weeks  Please repeat BMP and CBC in one week    Hospital Course: 58 year old female with history of traumatic brain injury, paraplegia, decubitus ulcers, ESRD on hemodialysis, vitamin D  deficiency, chronic indwelling suprapubic Foley, chronic anemia, hypertension, history of uterine cancer, history of seizures on antiepileptics chronically followed by wound care center and also followed by surgeon Dr. Evonnie was sent from the dialysis center today with concerns for low blood pressure and weakness.  Patient reports generalized weakness symptoms.  She was evaluated in the emergency department and found to have a hemoglobin of 5.1 down from 8.6 on 12/04/2023.  Pt had a debridement of her left hip decubitus ulcer by wound care specialist on 12/16/23.  Nephrology was consulted and plan on completing hemodialysis on 12/30/2023 when blood pressures improve and anemia addressed.  Patient was typed and crossed and 2 units of packed red blood cells ordered and admission requested for further management.  Assessment and Plan:  Symptomatic Anemia Acute blood loss anemia Hemorrhagic Shock -- Hg down to 5.1 from 8.6 on 12/04/23 -- pt did have a wound debridement on 12/16/23 and likely lost blood from procedure -- agree with type and cross and transfuse PRBC -- 2 units PRBC transfused on 8/4 and Hg up to 7/7 -- nephrology didn't feel she needed more PRBC transfused with HD today -- follow up anemia panel: iron  9, TIBC 104, ferritin 3152, folate>40 -- hemoccult stool --neg x 2 -- follow CBC   Fever/SIRS>>septic arthritis left hip -concerned about UTI vs pelvic/wound infection -repeat blood culture -start empiric  vanc/ceftriaxone  -UA> 50 WBC, follow urine culture 8/6 CT pelvis--Fluid and gas involving the left hip joint with lucency involving the proximal left femur. 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 --blood cultures repeated due to fever 101.1 --started vanc and ceftriaxone  --Called [facility]--spoke with Dr. Laveta Blank --Dr. Blank accepted patient in transfer --patient and daughter notified of CT findings and transfer to [facility] for definitive care of left hip septic arthritis. --pt is hemodynamically stable without tachycardia presently, does not need stepdown--can downgrade to med surg   ESRD on HD -- HD planned for 12/30/23 per nephrology team -- ED consulted nephrologist   Seizure Disorders -- seizure precautions -- resume home antiepileptic (keppra )    Chronic left hip Decubitus Ulcers New right hip wound now on wound vac  -- recently seen by surgery and wound care clinic -- pt had a debridement of her left hip/ischial decubitus ulcer on 12/16/23 -- wound vac on newer right hip decubitus ulcer per wound care specialist  -- continue chronic wound care orders -- pt just completed a 4 week course of IV vancomycin  MWF with dialysis on 12/24/23   Essential hypertension -- hypotension holding all home antihypertensives   Hypotension--improved --now hypertensive again -- in part due to infection, ABLA -- holding antihypertensives--amlodipine , coreg , clonidine  TTS-1, irbesartan  --restart coreg , amlodipine  for now -- IV norepinephrine  restarted>>weaned off -- remove clonidine  patch if still on skin   Chronic pain -- careful with opioids given hypotension    Chronic HFpEF -- volume management with HD  -- last HD 12/30/23   Neurogenic Bladder -- has chronic suprapubic catheter -- says she  is followed by urology at [facility] -- reports minimal urine output -- says she had suprapubic cath changed last month but poor historian      Antibiotics: Vanc  8/6>> Ceftriaxone  8/6>>      Consultants: none Procedures performed: none  Disposition: transfer to [facility] Diet recommendation:  Renal diet DISCHARGE MEDICATION: Allergies as of 12/31/2023       Reactions   Cefepime  Other (See Comments)   Severe AMS 09/2023 admission   Linezolid Other (See Comments)   Patient self-discontinued treatment due to GI intolerance. Taking it along with moxifloxacin   Moxifloxacin Other (See Comments)   Patient self-discontinued treatment due to GI intolerance. Taking it along with linezolid   Quinine Derivatives Other (See Comments)   Alters mental status   Vancomycin  Other (See Comments)   Unknown  Pt is tolerating this medication at HD   Azithromycin  Itching, Rash   Benadryl [diphenhydramine Hcl (sleep)] Hives   Daptomycin Hives   Tetracycline Itching   Able to tolerate Doxycycline .    Zosyn  [piperacillin  Sod-tazobactam So] Rash        Medication List     STOP taking these medications    cloNIDine  0.1 mg/24hr patch Commonly known as: CATAPRES  - Dosed in mg/24 hr   feeding supplement Liqd   furosemide  80 MG tablet Commonly known as: LASIX    irbesartan  150 MG tablet Commonly known as: AVAPRO        TAKE these medications    acetaminophen  500 MG tablet Commonly known as: TYLENOL  Take 1,000 mg by mouth every 6 (six) hours as needed for moderate pain.   albuterol  (2.5 MG/3ML) 0.083% nebulizer solution Commonly known as: PROVENTIL  Take 3 mLs (2.5 mg total) by nebulization every 4 (four) hours as needed for wheezing or shortness of breath.   amLODipine  10 MG tablet Commonly known as: NORVASC  Take 1 tablet (10 mg total) by mouth every evening. Take in the evenings --   carvedilol  12.5 MG tablet Commonly known as: COREG  Take 1 tablet (12.5 mg total) by mouth 2 (two) times daily with a meal.   cefTRIAXone  2 g in sodium chloride  0.9 % 100 mL Inject 2 g into the vein daily. Start taking on: January 01, 2024   cyclobenzaprine  5 MG  tablet Commonly known as: FLEXERIL  Take 1 tablet (5 mg total) by mouth 2 (two) times daily as needed for muscle spasms.   gabapentin  100 MG capsule Commonly known as: NEURONTIN  Take 1 capsule (100 mg total) by mouth 2 (two) times daily.   levETIRAcetam  500 MG tablet Commonly known as: KEPPRA  Take 2 tablets (1,000 mg total) by mouth every evening. Take an additional tablet on Monday,Wednesday and Friday evening after Dialysis   lidocaine -prilocaine  cream Commonly known as: EMLA  Apply 1 Application topically 3 (three) times a week.   multivitamin Tabs tablet Take 1 tablet by mouth daily.   pantoprazole  40 MG tablet Commonly known as: PROTONIX  Take 1 tablet (40 mg total) by mouth daily.   senna-docusate 8.6-50 MG tablet Commonly known as: Senokot-S Take 2 tablets by mouth at bedtime.   sevelamer  carbonate 800 MG tablet Commonly known as: RENVELA  Take 800 mg by mouth 3 (three) times daily.   vancomycin  500 MG/100ML IVPB Commonly known as: VANCOREADY Inject 100 mLs (500 mg total) into the vein Every Tuesday,Thursday,and Saturday with dialysis. Start taking on: January 01, 2024 What changed:  when to take this additional instructions        Discharge Exam: Filed Weights   12/29/23 1238  12/30/23 1626 12/30/23 1951  Weight: 45 kg 45 kg 43 kg   HEENT:  [place]/AT, No thrush, no icterus CV:  RRR, no rub, no S3, no S4 Lung:  CTA, no wheeze, no rhonchi Abd:  soft/+BS, NT Ext:  No edema, no lymphangitis, no synovitis, no rash        Condition at discharge: stable  The results of significant diagnostics from this hospitalization (including imaging, microbiology, ancillary and laboratory) are listed below for reference.   Imaging Studies: CT ABDOMEN PELVIS W CONTRAST Result Date: 12/31/2023 CLINICAL DATA:  Fever. Concern for intra-abdominal source. Sacral decubitus infection. EXAM: CT ABDOMEN AND PELVIS WITH CONTRAST TECHNIQUE: Multidetector CT imaging of the abdomen and  pelvis was performed using the standard protocol following bolus administration of intravenous contrast. RADIATION DOSE REDUCTION: This exam was performed according to the departmental dose-optimization program which includes automated exposure control, adjustment of the mA and/or kV according to patient size and/or use of iterative reconstruction technique. CONTRAST:  80mL OMNIPAQUE  IOHEXOL  300 MG/ML  SOLN COMPARISON:  CT 11/20/2023 FINDINGS: Lower chest: Cardiac enlargement. Volume loss in lower lobes. Trace right pleural fluid. Hepatobiliary: Minimal intrahepatic biliary dilatation is unchanged. No suspicious hepatic lesions. Gallbladder wall appears to be thickened but the gallbladder is also non distended. Main portal venous system is patent. Pancreas: Unremarkable. No pancreatic ductal dilatation or surrounding inflammatory changes. Spleen: Spleen is prominent and similar to the previous examination. Spleen measures roughly 12.7 cm in the AP dimension. No focal abnormality. Adrenals/Urinary Tract: Normal appearance of the adrenal glands. Both kidneys are atrophic. Evidence for small renal cysts. No suspicious renal lesions. No hydronephrosis. Urinary bladder is decompressed with a suprapubic tube. Stomach/Bowel: Mild distention of the stomach. No bowel dilatation or obstruction. No focal bowel inflammation. Vascular/Lymphatic: Aortic atherosclerotic disease without aneurysm. No significant lymph node enlargement in the abdomen or pelvis. Reproductive: No gross abnormality to the uterus or adnexal structures. Other: No significant free fluid.  Negative for free air. Musculoskeletal: Posterior dislocation/subluxation of the right hip joint. Again noted is fluid in the right hip joint. Decubitus ulcer on the right side to the level of the right ischial tuberosity. Chronic soft tissue thickening and calcifications in the posterior pelvis adjacent to the sacrum and coccyx. Decubitus ulceration along the posterior  aspect of the proximal left femur. Joint space loss in the left hip joint. Lucency involving the left femoral neck is concerning for osteomyelitis. Left hip joint effusion with a small amount of gas on image 67/2. Bone loss involving the left ilium could be associated with previous bone graft sites versus prior infection in this area. Incomplete visualization of the surgical hardware in the lower thoracic spine. Chronic disc space narrowing with endplate changes at T11-T12. Chronic heterotopic calcifications in the lateral left hip soft tissues. IMPRESSION: 1. Decubitus ulcerations involving the bilateral posterior pelvis. Fluid and gas involving the left hip joint with lucency involving the proximal left femur. 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 2. Posterior right hip dislocation/subluxation. Again noted is a right hip joint effusion. 3. No clear evidence for an intra-abdominal infectious source. However, the gallbladder wall appears to be thickened but the gallbladder is also decompressed. This could potentially be a source for infection or inflammation. Consider right upper quadrant ultrasound for further characterization. 4. Bilateral renal atrophy. Electronically Signed   By: Juliene Balder M.D.   On: 12/31/2023 16:21   DG CHEST PORT 1 VIEW Result Date: 12/31/2023 CLINICAL DATA:  Pulmonary edema. EXAM: PORTABLE  CHEST 1 VIEW COMPARISON:  12/30/2023 and CT chest 11/20/2023. FINDINGS: Patient is rotated. Trachea is midline. Heart is enlarged, stable. Thoracic aorta is calcified. Mild interstitial prominence and indistinctness with small bilateral pleural effusions, unchanged. Harrington rods are seen in the thoracic spine. IMPRESSION: Persistent mild congestive heart failure. Electronically Signed   By: Newell Eke M.D.   On: 12/31/2023 10:05   DG CHEST PORT 1 VIEW Result Date: 12/30/2023 CLINICAL DATA:  Fever. EXAM: PORTABLE CHEST 1 VIEW COMPARISON:  Radiograph yesterday  FINDINGS: The patient is rotated. Stable heart size and mediastinal contours. Small pleural effusions, increasing on the right. Worsening interstitial prominence in the lung bases. No pneumothorax. Thoracic lumbar fusion hardware. Remote right clavicle fracture. IMPRESSION: Small pleural effusions, increasing on the right. Worsening interstitial prominence in the lung bases, may be pulmonary edema or infection. Electronically Signed   By: Andrea Gasman M.D.   On: 12/30/2023 13:13   DG Chest Port 1 View Result Date: 12/29/2023 CLINICAL DATA:  Hypotension, shortness of breath. EXAM: PORTABLE CHEST 1 VIEW COMPARISON:  11/20/2023 and CT chest 11/20/2023. FINDINGS: Trachea is midline. Heart is enlarged, stable. Minimal basilar interstitial prominence. Trace bilateral pleural effusions. Biapical pleural thickening. IMPRESSION: Tiny bilateral pleural effusions. Difficult to exclude mild basilar pulmonary edema. Electronically Signed   By: Newell Eke M.D.   On: 12/29/2023 13:36    Microbiology: Results for orders placed or performed during the hospital encounter of 12/29/23  MRSA Next Gen by PCR, Nasal     Status: None   Collection Time: 12/29/23  3:00 PM   Specimen: Nasal Mucosa; Nasal Swab  Result Value Ref Range Status   MRSA by PCR Next Gen NOT DETECTED NOT DETECTED Final    Comment: (NOTE) The GeneXpert MRSA Assay (FDA approved for NASAL specimens only), is one component of a comprehensive MRSA colonization surveillance program. It is not intended to diagnose MRSA infection nor to guide or monitor treatment for MRSA infections. Test performance is not FDA approved in patients less than 73 years old. Performed at [facility], [address] [place], [place] [address]   Culture, blood (Routine X 2) w Reflex to ID Panel     Status: None (Preliminary result)   Collection Time: 12/29/23  9:18 PM   Specimen: BLOOD  Result Value Ref Range Status   Specimen Description BLOOD BLOOD RIGHT ARM   Final   Special Requests   Final    BOTTLES DRAWN AEROBIC AND ANAEROBIC Blood Culture adequate volume   Culture   Final    NO GROWTH 2 DAYS Performed at [facility], [address] [place], [place] [address]    Report Status PENDING  Incomplete  Culture, blood (Routine X 2) w Reflex to ID Panel     Status: None (Preliminary result)   Collection Time: 12/29/23  9:19 PM   Specimen: BLOOD  Result Value Ref Range Status   Specimen Description BLOOD RIGHT FOREARM  Final   Special Requests   Final    BOTTLES DRAWN AEROBIC AND ANAEROBIC Blood Culture adequate volume   Culture   Final    NO GROWTH 2 DAYS Performed at [facility], [address] [place], [place] [address]    Report Status PENDING  Incomplete  Culture, blood (Routine X 2) w Reflex to ID Panel     Status: None (Preliminary result)   Collection Time: 12/31/23  1:06 PM   Specimen: BLOOD  Result Value Ref Range Status   Specimen Description BLOOD BLOOD  RIGHT ARM  Final   Special Requests   Final    BOTTLES DRAWN AEROBIC AND ANAEROBIC Blood Culture adequate volume Performed at [facility], [address], [place], [place] [address]    Culture PENDING  Incomplete   Report Status PENDING  Incomplete  Culture, blood (Routine X 2) w Reflex to ID Panel     Status: None (Preliminary result)   Collection Time: 12/31/23  1:06 PM   Specimen: BLOOD  Result Value Ref Range Status   Specimen Description BLOOD BLOOD RIGHT HAND  Final   Special Requests   Final    BOTTLES DRAWN AEROBIC AND ANAEROBIC Blood Culture adequate volume Performed at [facility], [address], [place], [place] [address]    Culture PENDING  Incomplete   Report Status PENDING  Incomplete    Labs: CBC: Recent Labs  Lab 12/29/23 1301 12/29/23 2119 12/30/23 0421 12/31/23 0444  WBC 15.5*  --  10.8* 8.2  NEUTROABS 13.7*  --  9.1* 6.2  HGB 5.1* 7.4* 7.7* 7.5*  HCT 17.7* 23.7* 24.7* 25.0*  MCV 98.9  --  92.9 94.3  PLT 345  --  352 329   Basic  Metabolic Panel: Recent Labs  Lab 12/29/23 1301 12/30/23 0421 12/31/23 0444  NA 134* 129* 137  K 3.6 4.1 4.0  CL 96* 91* 96*  CO2 21* 21* 27  GLUCOSE 134* 116* 105*  BUN 75* 80* 38*  CREATININE 7.37* 7.69* 3.80*  CALCIUM  8.4* 8.6* 8.6*  PHOS  --  5.7* 2.9   Liver Function Tests: Recent Labs  Lab 12/30/23 0421 12/31/23 0444  ALBUMIN  1.8* 2.2*   CBG: No results for input(s): GLUCAP in the last 168 hours.  Discharge time spent: greater than 30 minutes.  Signed: Alm Schneider, MD [facility] 12/31/2023

## 2023-12-31 NOTE — Progress Notes (Addendum)
 PROGRESS NOTE  Tunya Held FMW:[id number] DOB: 02/04/1966 DOA: 12/29/2023 PCP: [name], [name], FNP  Brief History:  58 year old female with history of traumatic brain injury, paraplegia, decubitus ulcers, ESRD on hemodialysis, vitamin D  deficiency, chronic indwelling suprapubic Foley, chronic anemia, hypertension, history of uterine cancer, history of seizures on antiepileptics chronically followed by wound care center and also followed by surgeon Dr. Evonnie was sent from the dialysis center today with concerns for low blood pressure and weakness.  Patient reports generalized weakness symptoms.  She was evaluated in the emergency department and found to have a hemoglobin of 5.1 down from 8.6 on 12/04/2023.  Pt had a debridement of her left hip decubitus ulcer by wound care specialist on 12/16/23.  Nephrology was consulted and plan on completing hemodialysis on 12/30/2023 when blood pressures improve and anemia addressed.  Patient was typed and crossed and 2 units of packed red blood cells ordered and admission requested for further management.   Assessment/Plan:  Symptomatic Anemia Acute blood loss anemia Hemorrhagic Shock -- Hg down to 5.1 from 8.6 on 12/04/23 -- pt did have a wound debridement on 12/16/23 and likely lost blood from procedure -- agree with type and cross and transfuse PRBC -- 2 units PRBC transfused on 8/4 and Hg up to 7/7 -- nephrology didn't feel she needed more PRBC transfused with HD today -- follow up anemia panel: iron  9, TIBC 104, ferritin 3152, folate>40 -- hemoccult stool --neg x 2 -- follow CBC  Fever/SIRS -concerned about UTI vs pelvic/wound infection -CT abd/pelvis -repeat blood culture -start empiric vanc/ceftriaxone  -UA/culture   ESRD on HD -- HD planned for 12/30/23 per nephrology team -- ED consulted nephrologist   Seizure Disorders -- seizure precautions -- resume home antiepileptic (keppra )    Chronic left hip Decubitus  Ulcers New right hip wound now on wound vac  -- recently seen by surgery and wound care clinic -- pt had a debridement of her left hip/ischial decubitus ulcer on 12/16/23 -- wound vac on newer right hip decubitus ulcer per wound care specialist  -- continue chronic wound care orders -- pt just completed a 4 week course of IV vancomycin  MWF with dialysis on 12/24/23   Essential hypertension -- hypotension holding all home antihypertensives   Hypotension -- in part due to infection, ABLA -- holding antihypertensives -- IV norepinephrine  restarted>>weaned off -- remove clonidine  patch if still on skin   Chronic pain -- careful with opioids given hypotension    Chronic HFpEF -- volume management with HD    Neurogenic Bladder -- has chronic suprapubic catheter -- says she is followed by [facility]rology at [facility] -- reports minimal urine output -- says she had suprapubic cath changed last month but poor historian     Family Communication:  no Family at bedside  Consultants:  none  Code Status:  FULL  DVT Prophylaxis:  SCDs   Procedures: As Listed in Progress Note Above  Antibiotics: Vanc 8/6>> Ceftriaxone  8/6>>   Subjective: Patient denies fevers, chills, headache, chest pain, dyspnea, nausea, vomiting, diarrhea, abdominal pain, dysuria, hematuria, hematochezia, and melena.   Objective: Vitals:   12/31/23 0700 12/31/23 0745 12/31/23 0751 12/31/23 0800  BP: (!) 110/49   (!) 146/59  Pulse: (!) 101  74 (!) 112  Resp:   (!) 31 (!) 23  Temp:   (!) 100.7 F (38.2 C)   TempSrc:   Axillary   SpO2: 91% 91% 92% 92%  Weight:  Height:        Intake/Output Summary (Last 24 hours) at 12/31/2023 1022 Last data filed at 12/31/2023 0800 Gross per 24 hour  Intake 551.54 ml  Output 2000 ml  Net -1448.46 ml   Weight change: 0 kg Exam:  General:  Pt is alert, follows commands appropriately, not in acute distress HEENT: No icterus, No thrush, No neck mass,  [place]/AT Cardiovascular: RRR, S1/S2, no rubs, no gallops Respiratory: CTA bilaterally, no wheezing, no crackles, no rhonchi Abdomen: Soft/+BS, non tender, non distended, no guarding Extremities: No edema, No lymphangitis, No petechiae, No rashes, no synovitis  Left buttock    Right Buttock    Right hip    Data Reviewed: I have personally reviewed following labs and imaging studies Basic Metabolic Panel: Recent Labs  Lab 12/29/23 1301 12/30/23 0421 12/31/23 0444  NA 134* 129* 137  K 3.6 4.1 4.0  CL 96* 91* 96*  CO2 21* 21* 27  GLUCOSE 134* 116* 105*  BUN 75* 80* 38*  CREATININE 7.37* 7.69* 3.80*  CALCIUM  8.4* 8.6* 8.6*  PHOS  --  5.7* 2.9   Liver Function Tests: Recent Labs  Lab 12/30/23 0421 12/31/23 0444  ALBUMIN  1.8* 2.2*   No results for input(s): LIPASE, AMYLASE in the last 168 hours. No results for input(s): AMMONIA in the last 168 hours. Coagulation Profile: No results for input(s): INR, PROTIME in the last 168 hours. CBC: Recent Labs  Lab 12/29/23 1301 12/29/23 2119 12/30/23 0421 12/31/23 0444  WBC 15.5*  --  10.8* 8.2  NEUTROABS 13.7*  --  9.1* 6.2  HGB 5.1* 7.4* 7.7* 7.5*  HCT 17.7* 23.7* 24.7* 25.0*  MCV 98.9  --  92.9 94.3  PLT 345  --  352 329   Cardiac Enzymes: No results for input(s): CKTOTAL, CKMB, CKMBINDEX, TROPONINI in the last 168 hours. BNP: Invalid input(s): POCBNP CBG: No results for input(s): GLUCAP in the last 168 hours. HbA1C: No results for input(s): HGBA1C in the last 72 hours. Urine analysis:    Component Value Date/Time   COLORURINE YELLOW 05/14/2023 0037   APPEARANCEUR CLOUDY (A) 05/14/2023 0037   LABSPEC 1.012 05/14/2023 0037   PHURINE 8.0 05/14/2023 0037   GLUCOSEU >=500 (A) 05/14/2023 0037   HGBUR SMALL (A) 05/14/2023 0037   BILIRUBINUR NEGATIVE 05/14/2023 0037   KETONESUR NEGATIVE 05/14/2023 0037   PROTEINUR >=300 (A) 05/14/2023 0037   UROBILINOGEN 0.2 03/31/2015 1200   NITRITE  NEGATIVE 05/14/2023 0037   LEUKOCYTESUR LARGE (A) 05/14/2023 0037   Sepsis Labs: @LABRCNTIP (procalcitonin:4,lacticidven:4) ) Recent Results (from the past 240 hours)  MRSA Next Gen by PCR, Nasal     Status: None   Collection Time: 12/29/23  3:00 PM   Specimen: Nasal Mucosa; Nasal Swab  Result Value Ref Range Status   MRSA by PCR Next Gen NOT DETECTED NOT DETECTED Final    Comment: (NOTE) The GeneXpert MRSA Assay (FDA approved for NASAL specimens only), is one component of a comprehensive MRSA colonization surveillance program. It is not intended to diagnose MRSA infection nor to guide or monitor treatment for MRSA infections. Test performance is not FDA approved in patients less than 93 years old. Performed at [facility], [address], [place], [place] [address]   Culture, blood (Routine X 2) w Reflex to ID Panel     Status: None (Preliminary result)   Collection Time: 12/29/23  9:18 PM   Specimen: BLOOD  Result Value Ref Range Status   Specimen Description BLOOD BLOOD RIGHT ARM  Final   Special Requests   Final    BOTTLES DRAWN AEROBIC AND ANAEROBIC Blood Culture adequate volume   Culture   Final    NO GROWTH 2 DAYS Performed at [facility], [address], [place], [place] [address]    Report Status PENDING  Incomplete  Culture, blood (Routine X 2) w Reflex to ID Panel     Status: None (Preliminary result)   Collection Time: 12/29/23  9:19 PM   Specimen: BLOOD  Result Value Ref Range Status   Specimen Description BLOOD RIGHT FOREARM  Final   Special Requests   Final    BOTTLES DRAWN AEROBIC AND ANAEROBIC Blood Culture adequate volume   Culture   Final    NO GROWTH 2 DAYS Performed at [facility], [address] [place], [place] [address]    Report Status PENDING  Incomplete     Scheduled Meds:  calcitRIOL   1.75 mcg Oral Q M,W,F   Chlorhexidine  Gluconate Cloth  6 each Topical Q0600   cinacalcet   30 mg Oral Q M,W,F   feeding supplement  237 mL Oral TID BM    gabapentin   100 mg Oral BID   levETIRAcetam   1,000 mg Oral QPM   liver oil-zinc  oxide   Topical BID   multivitamin  1 tablet Oral Daily   pantoprazole   40 mg Oral Daily   senna-docusate  2 tablet Oral QHS   sevelamer  carbonate  800 mg Oral TID WC   sodium hypochlorite   Irrigation Daily   Continuous Infusions:  norepinephrine  (LEVOPHED ) Adult infusion Stopped (12/30/23 2104)    Procedures/Studies: DG CHEST PORT 1 VIEW Result Date: 12/31/2023 CLINICAL DATA:  Pulmonary edema. EXAM: PORTABLE CHEST 1 VIEW COMPARISON:  12/30/2023 and CT chest 11/20/2023. FINDINGS: Patient is rotated. Trachea is midline. Heart is enlarged, stable. Thoracic aorta is calcified. Mild interstitial prominence and indistinctness with small bilateral pleural effusions, unchanged. Harrington rods are seen in the thoracic spine. IMPRESSION: Persistent mild congestive heart failure. Electronically Signed   By: Newell Eke M.D.   On: 12/31/2023 10:05   DG CHEST PORT 1 VIEW Result Date: 12/30/2023 CLINICAL DATA:  Fever. EXAM: PORTABLE CHEST 1 VIEW COMPARISON:  Radiograph yesterday FINDINGS: The patient is rotated. Stable heart size and mediastinal contours. Small pleural effusions, increasing on the right. Worsening interstitial prominence in the lung bases. No pneumothorax. Thoracic lumbar fusion hardware. Remote right clavicle fracture. IMPRESSION: Small pleural effusions, increasing on the right. Worsening interstitial prominence in the lung bases, may be pulmonary edema or infection. Electronically Signed   By: Andrea Gasman M.D.   On: 12/30/2023 13:13   DG Chest Port 1 View Result Date: 12/29/2023 CLINICAL DATA:  Hypotension, shortness of breath. EXAM: PORTABLE CHEST 1 VIEW COMPARISON:  11/20/2023 and CT chest 11/20/2023. FINDINGS: Trachea is midline. Heart is enlarged, stable. Minimal basilar interstitial prominence. Trace bilateral pleural effusions. Biapical pleural thickening. IMPRESSION: Tiny bilateral pleural  effusions. Difficult to exclude mild basilar pulmonary edema. Electronically Signed   By: Newell Eke M.D.   On: 12/29/2023 13:36    Alm Schneider, DO  [facility]  If 7PM-7AM, please contact night-coverage [email or web address] Password [email or web address] 12/31/2023, 10:22 AM   LOS: 1 day

## 2023-12-31 NOTE — Plan of Care (Signed)

## 2023-12-31 NOTE — Progress Notes (Signed)
 Pharmacy Antibiotic Note  [name] is a 58 y.o. female admitted on 12/29/2023 with wound infection.  Pharmacy has been consulted for vancomycin  dosing.  Plan: Vancomycin  750 mg IV x 1 dose. Vancomycin  500 mg IV every HD Monitor vanco levels as indicated.  Height: 5' 2 (157.5 cm) Weight: 43 kg (94 lb 12.8 oz) IBW/kg (Calculated) : 50.1  Temp (24hrs), Avg:99.7 F (37.6 C), Min:98 F (36.7 C), Max:101.1 F (38.4 C)  Recent Labs  Lab 12/29/23 1301 12/30/23 0421 12/31/23 0444  WBC 15.5* 10.8* 8.2  CREATININE 7.37* 7.69* 3.80*    Estimated Creatinine Clearance: 11.2 mL/min (A) (by C-G formula based on SCr of 3.8 mg/dL (H)).    Allergies  Allergen Reactions   Cefepime  Other (See Comments)    Severe AMS 09/2023 admission   Linezolid Other (See Comments)    Patient self-discontinued treatment due to GI intolerance. Taking it along with moxifloxacin   Moxifloxacin Other (See Comments)    Patient self-discontinued treatment due to GI intolerance. Taking it along with linezolid   Quinine Derivatives Other (See Comments)    Alters mental status   Vancomycin  Other (See Comments)    Unknown  Pt is tolerating this medication at HD   Azithromycin  Itching and Rash   Benadryl [Diphenhydramine Hcl (Sleep)] Hives   Daptomycin Hives   Tetracycline Itching    Able to tolerate Doxycycline .    Zosyn  [Piperacillin  Sod-Tazobactam So] Rash    Antimicrobials this admission: Vanco 8/6 >> CTX 8/6 >> Just finished 4 weeks of IV vanco 12/24/23  Microbiology results: 8/4 BCx: ngtd MRSA PCR: neg   Thank you for allowing pharmacy to be a part of this patient's care.  Elspeth Sour, PharmD Clinical Pharmacist 12/31/2023 12:52 PM

## 2023-12-31 NOTE — Consult Note (Addendum)
 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 Cheree office requesting referral to new wound care clinic  Reason for Consult:Stage 4 Pressure Injuries  Wound type: Healing Stage 4 PI Sacrum, Stage 4 PI R ischium/trochanter, Stage 4 Pressure Injury L buttocks/ischium  Pressure Injury POA: Yes Measurement:wound vac present R, see nursing flowsheet on open wounds  Wound bed:L buttock/ischium 70% pink moist 30% gray necrotic; Sacrum 40% pink 60% tan  Drainage (amount, consistency, odor) see nursing flowsheet  Periwound: scar tissue  Dressing procedure/placement/frequency: Cleanse sacrum and L buttock/ischial wounds with Vashe wound cleanser Soila [phone]) do not rinse and allow to air dry.  Apply Dakin's moistened gauze to wound bed daily utilizing a Q tip applicator to cover any areas of depth/tunneling/undermining to L buttock.  Cover with dry gauze and secure with ABD pad or silicone foam whichever is preferred.   Will order Desitin to be placed to intact skin of buttocks/sacrum 2 times a day.    Patient should remain on a low air loss mattress throughout hospitalization for pressure redistribution.    Secure chat to primary MD that WOC team will come change NPWT R hip/ischium Thursday 01/01/2024.  This WOC RN called materials to request supplies as follows: (2) medium vac kits Soila [phone]), a CINDERELLA kathyleen Collum [phone] and vac cannister Lawson#[id number].   Did secure chat primary RN to make sure patient is already hooked up to hospital NPWT and not on home device as per hospital policy.    Thank you,    Powell Bar MSN, RN-BC, [employer]

## 2023-12-31 NOTE — Progress Notes (Signed)
 [facility] called to notify that patient has a bed assigned to room 804 at the [facility] on [address].   Nurse called and spoke to Tammy at [facility] to set up transportation for patient to be transferred to [facility]. Was informed that transportation has been set up and the pick-up time is unknown at this time, however, [facility] will call to let staff know when they are in route with an approximate estimated time of arrival. Patient has been notified. No questions or concerns from patient at this time.

## 2024-01-01 LAB — BLOOD CULTURE ID PANEL (REFLEXED) - BCID2

## 2024-01-01 NOTE — Progress Notes (Signed)
 PHARMACY - PHYSICIAN COMMUNICATION CRITICAL VALUE ALERT - BLOOD CULTURE IDENTIFICATION (BCID)  [name] is an 58 y.o. female who presented to [facility] on 12/29/2023   Assessment:  Enterobacterales BCID- no further identification. patient transferred to [facility]  Current antibiotics: Vanco/ceftriaxone  while at [facility]  Changes to prescribed antibiotics recommended:    Results for orders placed or performed during the hospital encounter of 12/29/23  Blood Culture ID Panel (Reflexed) (Collected: 12/29/2023  9:18 PM)  Result Value Ref Range   Enterococcus faecalis NOT DETECTED NOT DETECTED   Enterococcus Faecium NOT DETECTED NOT DETECTED   Listeria monocytogenes NOT DETECTED NOT DETECTED   Staphylococcus species NOT DETECTED NOT DETECTED   Staphylococcus aureus (BCID) NOT DETECTED NOT DETECTED   Staphylococcus epidermidis NOT DETECTED NOT DETECTED   Staphylococcus lugdunensis NOT DETECTED NOT DETECTED   Streptococcus species NOT DETECTED NOT DETECTED   Streptococcus agalactiae NOT DETECTED NOT DETECTED   Streptococcus pneumoniae NOT DETECTED NOT DETECTED   Streptococcus pyogenes NOT DETECTED NOT DETECTED   A.calcoaceticus-baumannii NOT DETECTED NOT DETECTED   Bacteroides fragilis NOT DETECTED NOT DETECTED   Enterobacterales DETECTED (A) NOT DETECTED   Enterobacter cloacae complex NOT DETECTED NOT DETECTED   Escherichia coli NOT DETECTED NOT DETECTED   Klebsiella aerogenes NOT DETECTED NOT DETECTED   Klebsiella oxytoca NOT DETECTED NOT DETECTED   Klebsiella pneumoniae NOT DETECTED NOT DETECTED   Proteus species NOT DETECTED NOT DETECTED   Salmonella species NOT DETECTED NOT DETECTED   Serratia marcescens NOT DETECTED NOT DETECTED   Haemophilus influenzae NOT DETECTED NOT DETECTED   Neisseria meningitidis NOT DETECTED NOT DETECTED   Pseudomonas aeruginosa NOT DETECTED NOT DETECTED   Stenotrophomonas maltophilia NOT DETECTED NOT DETECTED   Candida albicans  NOT DETECTED NOT DETECTED   Candida auris NOT DETECTED NOT DETECTED   Candida glabrata NOT DETECTED NOT DETECTED   Candida krusei NOT DETECTED NOT DETECTED   Candida parapsilosis NOT DETECTED NOT DETECTED   Candida tropicalis NOT DETECTED NOT DETECTED   Cryptococcus neoformans/gattii NOT DETECTED NOT DETECTED   CTX-M ESBL NOT DETECTED NOT DETECTED   Carbapenem resistance IMP NOT DETECTED NOT DETECTED   Carbapenem resistance KPC NOT DETECTED NOT DETECTED   Carbapenem resistance NDM NOT DETECTED NOT DETECTED   Carbapenem resist OXA 48 LIKE NOT DETECTED NOT DETECTED   Carbapenem resistance VIM NOT DETECTED NOT DETECTED    Elspeth JAYSON Sour 01/01/2024  8:45 AM

## 2024-01-01 NOTE — Progress Notes (Signed)
 Case Management Screening  CSN: [id number] DOB: September 11, 1965 Service: Nephrology Location: N804/A   Initial Screening Readmission Risk Score v2: 18.4 Risk Level: High - Patient meets high risk criteria for post hospital services. Assessment to be completed. Discharge needs identified:: Yes Discharge needs:: Undetermined, CM/SW  PRE MD NOTE: Patient presents here from OSH for evaluation of L hip wound concerning for septic arthritis and osteomyelitis, admitted for further evaluation and treatment. PMH of  ESRD on HD MWF, paraplegia s/p T7 SCI, HFpEF, recurrent UTI, recurrent decubitus ulcers, neurogenic bladder, chronic anemia, HTN, Hx of uterine cancer, and Hx of seizures.  CM/SW will continue to follow to assist with discharge needs that may arise.  Garen Lenon Pinal, RN BSN Care Coordination [phone] Pager [phone]

## 2024-01-01 NOTE — Consults (Signed)
 Nutrition Note   PATIENT NAME: [name] DATE: 01/01/2024  TIME: 6:57 AM  Note Type: Assessment Referral Reason: Pressure injury  Assessment  58 y.o. female with a PMHx of ESRD on HD MWF, paraplegia s/p T7 SCI, HFpEF, recurrent UTI, recurrent decubitus ulcers, neurogenic bladder, chronic anemia, HTN, Hx of uterine cancer, and Hx of seizures, who presents from outside hospital for evaluation of L hip wound concerning for septic arthritis and osteomyelitis. Overall pt feeling well. Endorsing good appetite and intake PTA. NPO this AM. Denies barriers to intake at this time. Denies N/V. Denies abdominal pain. Last BM 8/6. Drinks ONS at home. Agreeable to receiving here.   Interventions  Nutrition Intervention: General healthful diet, Medical food supplements, Vitamin and/or mineral supplements Diet advancement as medically able RD to add Nerpro once daily Start NEPHROCAPS once daily    Nutrition Diagnosis    Nutrition Diagnosis: Increased nutrient needs Problem Related To: Increased nutrient demand Problem as Evidenced By: PI       Nutrition Focused Physical Findings  Deferred Due To: NFPE not indicated  Muscle Mass Depletion:     Body Fat Depletion:              Pertinent Information  Anthropometrics Height: 157.5 cm (5' 2.01) Weight: 52.5 kg (115 lb 11.9 oz) Weight History/Comments: . Wt Readings from Last 10 Encounters:  01/01/24 52.5 kg (115 lb 11.9 oz)  05/01/23 54.4 kg (120 lb)  01/21/23 49.9 kg (110 lb)  12/24/22 50 kg (110 lb 3.7 oz)  10/29/22 49.9 kg (110 lb)  09/26/22 49.9 kg (110 lb)  08/27/22 49.9 kg (110 lb)  08/03/22 50.8 kg (112 lb)  06/18/22 56.7 kg (125 lb)  04/04/22 48.5 kg (107 lb)    Weight Classification: Normal BMI: 21 Skin Integrity: PI - R ischium Nutrition Related Medications: Reviewed Labs: Na 133; Cr 5.19; Mg 1.8 GI Symptoms: None Last BM Date: 12/31/23    Current Diet and Nutrition Support Details  Diet NPO    Enteral  Nutrition                 Parenteral Nutrition           Total Regimen Provides      Estimated Needs  Nutrition Needs Based On: IBW 50 kg (110 lb 3.7 oz)  Calories (kcal/day): 1500  Calories based on: 30-35 kcal/kg  Protein (g/day): 60  Protein based on: 1.2-1.5g/kg  Fluid (mL/day):   Per MD      Monitoring & Evaluation  Nutrition Goals: Adequate PO, Appropriate wound healing Nutrition Goal Status: Initial   Follow-up Information  Follow-Up Date: 01/08/24 Follow-Up Reason: Reassessment   Contact RD on treatment team. After hours or weekends, use secure chat group: [facility]  [facility] Adult Dietitians [place]  [facility] Dietitians [facility]  [facility] Dietitians [facility]  [facility] Dietitians [facility]  [facility] Dietitians   Fairy Katz [place], [place] 01/01/2024 6:57 AM

## 2024-01-02 ENCOUNTER — Telehealth: Payer: Self-pay | Admitting: Pharmacist

## 2024-01-02 NOTE — Progress Notes (Signed)
 PHARMACY - PHYSICIAN COMMUNICATION CRITICAL VALUE ALERT - BLOOD CULTURE IDENTIFICATION (BCID)  8/4 blood culture with GNR, BCID detected enterobacterales.  No specific organism on the BCID panel was detected.    Patient transferred to [facility] but no documentation they were made aware of result.    Name of person Contacted: Engineer, manufacturing, RN at [facility]   Celestine Slovak, PharmD, BCPS, BCIDP Work Cell: [phone] 01/02/2024 8:07 AM

## 2024-01-03 LAB — CULTURE, BLOOD (ROUTINE X 2)
Culture: NO GROWTH
Special Requests: ADEQUATE

## 2024-01-03 LAB — URINE CULTURE: Culture: >=100000 — AB

## 2024-01-04 LAB — CULTURE, BLOOD (ROUTINE X 2): Special Requests: ADEQUATE

## 2024-01-05 ENCOUNTER — Telehealth: Payer: Self-pay | Admitting: *Deleted

## 2024-01-05 LAB — CULTURE, BLOOD (ROUTINE X 2)
Culture: NO GROWTH
Culture: NO GROWTH
Special Requests: ADEQUATE
Special Requests: ADEQUATE

## 2024-01-05 NOTE — Telephone Encounter (Signed)
 Received return fax from [facility]~ [phone] telephone/ [phone]~ fax.  Reports that patient is too complex for wound center~ need for possible HBO. Recommended to send to [facility] in [place].   Of note, patient is currently admitted to [facility] for sepsis.
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
## 2024-01-13 ENCOUNTER — Ambulatory Visit: Admitting: Urology

## 2024-01-16 NOTE — Care Plan (Signed)
 Nephrology Plan of Care Note: Dialysis Book Back Orders  These dialysis bookback orders have been communicated directly with Alberta Hasty, RN at [facility] [phone]) dialysis center on 01/16/24 at 1310.   Patient: [name] Date of Birth: 03/01/1966 MRN: [id number] Admission Date: 12/31/2023 10:11 PM Discharge Date: 01/16/2024  Discharge Diagnosis:  Principal Problem:   Septic arthritis    (CMD) Active Problems:   Subacute osteomyelitis of left femur    (CMD)   Other osteomyelitis, multiple sites    (CMD) Resolved Problems:   * No resolved hospital problems. *  Most Recent Renal Labs  Results from last 7 days  Lab Units 01/16/24 0651 01/15/24 0321 01/15/24 0320  HEMOGLOBIN g/dL 8.6* 8.4*  --   POTASSIUM mmol/L 4.2  --  4.2  CO2 mmol/L 26  --  30  CALCIUM  mg/dL 8.6  --  8.7  PHOSPHORUS mg/dL 4.6  --  3.5   Outpatient Dialysis BookBack Orders: - Please change estimated dry weight to: 42.5 kg  - Aranesp  80 mcg administered on 8/22   Medication and Lab Orders: - See updated medication list below - Antibiotic orders (to be given at HD): ~ Vancomycin  goal level 15-20 by protocol until 02/20/24. Vanc random resulted 18.8 on 8/22. Please administer over two hours d/t history of reaction with medication administered over a short time span.  ~ Cefepime  2/2/2 g after each treatment until 02/20/24  - Please fax weekly labs to OPAT at [phone] CBC with diff and platelet, CMP, ESR, non-cardiac CRP   Updated discharge medication list:   Medication List     ASK your doctor about these medications    acetaminophen  500 mg tablet Commonly known as: TYLENOL  Take 1,000 mg by mouth as needed in the morning and 1,000 mg as needed in the evening for mild pain (1-3).   * albuterol  2.5 mg /3 mL (0.083 %) nebulizer solution Take 2.5 mg by nebulization every 6 (six) hours as needed for wheezing or shortness of breath.   * albuterol  HFA 90 mcg/actuation  inhaler Commonly known as: PROVENTIL  HFA;VENTOLIN  HFA;PROAIR  HFA Inhale 1-2 puffs every 4 (four) hours as needed for shortness of breath or wheezing.   * amLODIPine  10 mg tablet Commonly known as: NORVASC  Take 10 mg by mouth daily.   * amLODIPine  5 mg tablet Commonly known as: NORVASC  Take 1 and one-half tablets (7.5 mg total) by mouth daily. (Take 1.5 tablets (7.5 mg total) by mouth daily.)   C-Tub Misc Generic drug: miscellaneous medical supply   carvediloL  12.5 mg tablet Commonly known as: COREG  Take 1 tablet (12.5 mg total) by mouth in the morning and 1 tablet (12.5 mg total) in the evening. Take with meals.   cloNIDine  0.1 mg tablet Commonly known as: CATAPRES  Take 1 tablet (0.1 mg total) by mouth 2 (two) times a day Indications: high blood pressure.   cloNIDine  0.1 mg/24 hr patch Commonly known as: CATAPRES -TTS Place 1 patch on the skin once a week.   collagenase  250 unit/gram ointment Commonly known as: SANTYL  Apply topically daily.   cyclobenzaprine  5 mg tablet Commonly known as: FLEXERIL  TAKE (1) TABLET BY MOUTH (3) TIMES DAILY AS NEEDED.   DOCUSATE SODIUM  ORAL Take 1 capsule by mouth nightly.   * furosemide  80 mg tablet Commonly known as: LASIX  Take one table by mouth four times daily on non-dialysis days   * furosemide  20 mg tablet Commonly known as: LASIX  Take 1 tablet (20 mg total) by mouth daily.  gabapentin  100 mg capsule Commonly known as: NEURONTIN  Take 100 mg by mouth 2 (two) times a day.   irbesartan  150 mg tablet Commonly known as: AVAPRO  Take 1 tablet (150 mg total) by mouth every evening.   levETIRAcetam  500 mg tablet Commonly known as: KEPPRA  Indications: additional medication to treat partial seizures. Take 2 tablets (1,000 mg total) by mouth daily. Take additional 500 mg after dialysis (on Mon Wed Fri) in the evening.   lidocaine -prilocaine  2.5-2.5 % cream Commonly known as: EMLA  Apply topically three times weekly before  dialysis   MISCELLANEOUS MEDICATION/PRODUCT   naloxone  4 mg/actuation Spry nasal spray Commonly known as: Narcan  Administer 1 spray into affected nostril(s) as needed (for signs and symptoms of overdose, respiratory depression from opioid use).   pantoprazole  40 mg EC tablet Commonly known as: PROTONIX  Take 40 mg by mouth Once Daily.   Rena-Vite Rx 1-60-300 mg-mg-mcg tablet Generic drug: B complex-vitamin C -folic acid    sevelamer  carbonate 800 mg tablet Commonly known as: RENVELA  Take 1 tablet (800 mg total) by mouth in the morning and 1 tablet (800 mg total) at noon and 1 tablet (800 mg total) in the evening. Take with meals. Swallow tablet whole; do not crush, break, or chew..      * * This list has 6 medication(s) that are the same as other medications prescribed for you. Read the directions carefully, and ask your doctor or other care provider to review them with you.          During admission, the patient received 1uPRBC this admission.  Allergy list at DC: Allergies[1]  Electronically signed by: Dena Trinidad Double, FNP 01/16/2024 1:14 PM        [1] Allergies Allergen Reactions  . Dialyzer Anaphylaxis  . Cefepime  Other (See Comments)    Severe AMS 09/2023 admission  . Daptomycin Hives  . Diphenhydramine Hcl Hives    Many years ago, does not remember details  . Linezolid GI Intolerance    Patient self-discontinued treatment due to GI intolerance. Taking it along with moxifloxacin  . Moxifloxacin GI Intolerance    Patient self-discontinued treatment due to GI intolerance. Taking it along with linezolid  . Piperacillin -Tazobactam     Tolerated in 12/2023 admission  . Quinine Psychosis    Alters mental status  . Vancomycin  Other (See Comments)    During 09/2021 admission tolerated over vancomycin  over a longer infusion  . Azithromycin  Itching and Rash  . Piperacillin  Rash  . Tetracycline Itching    Able to tolerate Doxycycline .   *Some images could not be  shown.

## 2024-01-16 NOTE — Telephone Encounter (Signed)
 Received call from primary team Hospital Discharge appointment scheduled. Patient requires close monitoring by the OPAT team. Patient added to OPAT list.

## 2024-01-19 ENCOUNTER — Telehealth: Payer: Self-pay

## 2024-01-19 NOTE — Transitions of Care (Post Inpatient/ED Visit) (Signed)
   01/19/2024  Name: [name] MRN: [id number] DOB: 05/23/1966  Today's TOC FU Call Status: Today's TOC FU Call Status:: Unsuccessful Call (1st Attempt) Unsuccessful Call (1st Attempt) Date: 01/16/24  Attempted to reach the patient regarding the most recent Inpatient/ED visit. Patient answered and declined the call, stated she was at dialysis.   Follow Up Plan: Additional outreach attempts will be made to reach the patient to complete the Transitions of Care (Post Inpatient/ED visit) call.    [name] MSN, RN RN Case Sales executive Health  [facility] Office Hours M-F [phone] Direct Dial: [phone] Main Phone [phone]  Fax: [phone] [email or web address]

## 2024-01-20 ENCOUNTER — Emergency Department (HOSPITAL_COMMUNITY): Admission: EM | Admit: 2024-01-20 | Discharge: 2024-01-20 | Disposition: A | Discharge status: Home / Self Care

## 2024-01-20 ENCOUNTER — Other Ambulatory Visit: Payer: Self-pay

## 2024-01-20 ENCOUNTER — Ambulatory Visit: Payer: Self-pay

## 2024-01-20 ENCOUNTER — Telehealth: Payer: Self-pay

## 2024-01-20 DIAGNOSIS — L89229 Pressure ulcer of left hip, unspecified stage: Secondary | ICD-10-CM | POA: Diagnosis present

## 2024-01-20 DIAGNOSIS — T148XXA Other injury of unspecified body region, initial encounter: Secondary | ICD-10-CM

## 2024-01-20 NOTE — Discharge Instructions (Signed)
 Please continue to use the wound VAC and follow-up with your doctor.  Return to the ER for worsening symptoms.

## 2024-01-20 NOTE — Transitions of Care (Post Inpatient/ED Visit) (Signed)
   01/20/2024  Name: [name] MRN: [id number] DOB: 1965/10/17  Today's TOC FU Call Status: Today's TOC FU Call Status:: Unsuccessful Call (2nd Attempt) Unsuccessful Call (1st Attempt) Date: 01/19/24 Unsuccessful Call (2nd Attempt) Date: 01/20/24  Attempted to reach the patient regarding the most recent Inpatient/ED visit.  Follow Up Plan: No further outreach attempts will be made at this time. We have been unable to contact the patient. On 01/18/25 Patient declined TOC calls.  Second attempt today, 01/20/24 unsuccessful. Documented Care Coordination: Do Not Contact/Call for TOC calls today.    [name] MSN, RN RN Case Sales executive Health  [facility] Office Hours M-F [phone] Direct Dial: [phone] Main Phone [phone]  Fax: [phone] [email or web address]

## 2024-01-20 NOTE — ED Provider Notes (Signed)
  EMERGENCY DEPARTMENT AT [facility] Provider Note   CSN: [id number] Arrival date & time: 01/20/24  1319     Patient presents with: Wound Check   Abegail Jeanita Carneiro is a 58 y.o. female.   57 year old female with chronic left hip wound presenting to the emergency department today after being sent in by her wound care nurse with concern for a sponge from her wound VAC that would not come out of her wound.  The patient states she is currently being treated for this and is in her normal state of health.   Wound Check       Prior to Admission medications   Medication Sig Start Date End Date Taking? Authorizing Provider  acetaminophen  (TYLENOL ) 500 MG tablet Take 1,000 mg by mouth every 6 (six) hours as needed for moderate pain.    [provider]  albuterol  (PROVENTIL ) (2.5 MG/3ML) 0.083% nebulizer solution Take 3 mLs (2.5 mg total) by nebulization every 4 (four) hours as needed for wheezing or shortness of breath. 04/15/23 04/14/24  Pearlean Manus, MD  amLODipine  (NORVASC ) 10 MG tablet Take 1 tablet (10 mg total) by mouth every evening. Take in the evenings -- 11/24/23   Pearlean Manus, MD  carvedilol  (COREG ) 12.5 MG tablet Take 1 tablet (12.5 mg total) by mouth 2 (two) times daily with a meal. 11/24/23   Emokpae, Courage, MD  cefTRIAXone  2 g in sodium chloride  0.9 % 100 mL Inject 2 g into the vein daily. 01/01/24   Evonnie Lenis, MD  cyclobenzaprine  (FLEXERIL ) 5 MG tablet Take 1 tablet (5 mg total) by mouth 2 (two) times daily as needed for muscle spasms. 11/24/23   Pearlean Manus, MD  gabapentin  (NEURONTIN ) 100 MG capsule Take 1 capsule (100 mg total) by mouth 2 (two) times daily. 12/11/23   [name], [name], FNP  levETIRAcetam  (KEPPRA ) 500 MG tablet Take 2 tablets (1,000 mg total) by mouth every evening. Take an additional tablet on Monday,Wednesday and Friday evening after Dialysis 11/24/23   Pearlean Manus, MD  lidocaine -prilocaine  (EMLA ) cream Apply 1  Application topically 3 (three) times a week. 06/04/22   [provider]  multivitamin (RENA-VIT) TABS tablet Take 1 tablet by mouth daily. 07/29/23   [provider]  pantoprazole  (PROTONIX ) 40 MG tablet Take 1 tablet (40 mg total) by mouth daily. 11/24/23   Pearlean Manus, MD  senna-docusate (SENOKOT-S) 8.6-50 MG tablet Take 2 tablets by mouth at bedtime. 11/24/23   Pearlean Manus, MD  sevelamer  carbonate (RENVELA ) 800 MG tablet Take 800 mg by mouth 3 (three) times daily. 12/11/22   [provider]  vancomycin  (VANCOREADY) 500 MG/100ML IVPB Inject 100 mLs (500 mg total) into the vein Every Tuesday,Thursday,and Saturday with dialysis. 01/01/24   Evonnie Lenis, MD    Allergies: Cefepime , Linezolid, Moxifloxacin, Quinine derivatives, Vancomycin , Azithromycin , Benadryl [diphenhydramine hcl (sleep)], Daptomycin, Tetracycline, and Zosyn  [piperacillin  sod-tazobactam so]    Review of Systems  All other systems reviewed and are negative.   Updated Vital Signs BP 116/65   Pulse 86   Temp 98.1 F (36.7 C)   Resp 16   Ht 5' 2 (1.575 m)   Wt 43 kg   LMP  (LMP Unknown)   SpO2 95%   BMI 17.34 kg/m   Physical Exam Vitals and nursing note reviewed.   General: No acute distress Skin/MSK: There is a black sponge noted in the patient's wound over her left hip.  The wound itself did not have any significant drainage at  the moment and no surrounding erythema.  (all labs ordered are listed, but only abnormal results are displayed) Labs Reviewed - No data to display  EKG: None  Radiology: No results found.   Procedures   Medications Ordered in the ED - No data to display                                  Medical Decision Making 58 year old female presenting to the emergency department today with concerns for a sponge being stuck in her chronic wound.  I was able to successfully remove this here and there did not appear to be any remnants.  The patient does have wound  care supplies at home.  This is a chronic wound that she is being managed for as an outpatient.  Think she is stable for discharge with return precautions.        Final diagnoses:  Chronic wound    ED Discharge Orders     None          Ula Prentice SAUNDERS, MD 01/20/24 1345

## 2024-01-20 NOTE — ED Triage Notes (Signed)
 Pt wound nurse sent pt to ED d/t black sponge to wound care is stuck inside pt wound.

## 2024-01-22 ENCOUNTER — Ambulatory Visit: Admitting: Urology

## 2024-01-27 ENCOUNTER — Inpatient Hospital Stay: Admitting: Nurse Practitioner

## 2024-01-28 ENCOUNTER — Other Ambulatory Visit: Payer: Self-pay | Admitting: Family Medicine

## 2024-01-28 DIAGNOSIS — I10 Essential (primary) hypertension: Secondary | ICD-10-CM

## 2024-01-29 ENCOUNTER — Encounter: Payer: Self-pay | Admitting: Family Medicine

## 2024-01-29 ENCOUNTER — Ambulatory Visit (INDEPENDENT_AMBULATORY_CARE_PROVIDER_SITE_OTHER): Payer: Self-pay | Admitting: Family Medicine

## 2024-01-29 VITALS — BP 109/64 | HR 83 | Resp 16

## 2024-01-29 DIAGNOSIS — R7989 Other specified abnormal findings of blood chemistry: Secondary | ICD-10-CM | POA: Diagnosis not present

## 2024-01-29 DIAGNOSIS — Z09 Encounter for follow-up examination after completed treatment for conditions other than malignant neoplasm: Secondary | ICD-10-CM | POA: Diagnosis not present

## 2024-01-29 NOTE — Progress Notes (Signed)
   Established Patient Office Visit   Subjective  Patient ID: [name], female    DOB: 1966-05-24  Age: 58 y.o. MRN: [id number]  Chief Complaint  Patient presents with   Hospitalization Follow-up    Was at [facility]. Was given vancomycin  and she is allergic and it makes her itch. She was getting it at dialysis but stopped when she realized it was making her itch. [facility] called her in some medication for itch today.    She  has a past medical history of Abnormal uterine bleeding (AUB) (06/15/2014), Cancer (HCC), ESRD on hemodialysis (HCC), High blood pressure, Paraplegia (lower), Seizure disorder (HCC), Seizures (HCC), Suprapubic catheter (HCC), and Urinary tract infection.  HPI Patient presents to the clinic for  hospital discharge follow up. Patient presented to the ED on 01/20/24 with concern for a sponge retained in her chronic wound. The sponge was successfully removed with no remnants noted. Patient has wound care supplies at home and is being followed as an outpatient for this chronic wound. Patient was stable for discharge with return precautions. Review of Systems  Constitutional:  Negative for chills and fever.  Respiratory:  Negative for shortness of breath.   Cardiovascular:  Negative for chest pain.      Objective:     BP 109/64   Pulse 83   Resp 16   LMP  (LMP Unknown)   SpO2 95%  BP Readings from Last 3 Encounters:  01/29/24 109/64  01/20/24 116/65  12/31/23 (!) 148/70      Physical Exam Vitals reviewed.  Constitutional:      General: She is not in acute distress.    Appearance: Normal appearance. She is not ill-appearing, toxic-appearing or diaphoretic.  HENT:     Head: Normocephalic.  Eyes:     General:        Right eye: No discharge.        Left eye: No discharge.     Conjunctiva/sclera: Conjunctivae normal.  Cardiovascular:     Rate and Rhythm: Normal rate.     Pulses: Normal pulses.     Heart sounds: Normal heart sounds.   Pulmonary:     Effort: Pulmonary effort is normal. No respiratory distress.     Breath sounds: Normal breath sounds.  Neurological:     Mental Status: She is alert.  Psychiatric:        Mood and Affect: Mood normal.        Behavior: Behavior normal.      No results found for any visits on 01/29/24.  The ASCVD Risk score (Arnett DK, et al., 2019) failed to calculate for the following reasons:   Risk score cannot be calculated because patient has a medical history suggesting prior/existing ASCVD    Assessment & Plan:  Abnormal CBC -     CBC with Differential/Platelet -     Iron   Hospital discharge follow-up Assessment & Plan:  CBC labs ordered. The hospital chart, including the discharge summary, was thoroughly reviewed Medications were thoroughly reviewed and reconciled with the patient.      Return in about 2 months (around 03/30/2024), or if symptoms worsen or fail to improve, for chronic follow-up.   Hilario Kidd Wilhelmena Falter, FNP

## 2024-01-29 NOTE — Assessment & Plan Note (Signed)
 CBC labs ordered. The hospital chart, including the discharge summary, was thoroughly reviewed Medications were thoroughly reviewed and reconciled with the patient.

## 2024-01-29 NOTE — Patient Instructions (Signed)

## 2024-01-30 ENCOUNTER — Ambulatory Visit: Payer: Self-pay | Admitting: Family Medicine

## 2024-01-30 ENCOUNTER — Other Ambulatory Visit: Payer: Self-pay | Admitting: Family Medicine

## 2024-01-30 DIAGNOSIS — R7989 Other specified abnormal findings of blood chemistry: Secondary | ICD-10-CM

## 2024-01-30 DIAGNOSIS — Z1211 Encounter for screening for malignant neoplasm of colon: Secondary | ICD-10-CM

## 2024-01-30 DIAGNOSIS — D509 Iron deficiency anemia, unspecified: Secondary | ICD-10-CM

## 2024-01-30 LAB — CBC WITH DIFFERENTIAL/PLATELET
Basophils Absolute: 0.1 x10E3/uL (ref 0.0–0.2)
Basos: 1 %
EOS (ABSOLUTE): 0.7 x10E3/uL — ABNORMAL HIGH (ref 0.0–0.4)
Eos: 9 %
Hematocrit: 23.7 % — ABNORMAL LOW (ref 34.0–46.6)
Hemoglobin: 7.2 g/dL — ABNORMAL LOW (ref 11.1–15.9)
Immature Grans (Abs): 0 x10E3/uL (ref 0.0–0.1)
Immature Granulocytes: 0 %
Lymphocytes Absolute: 1.3 x10E3/uL (ref 0.7–3.1)
Lymphs: 16 %
MCH: 30.4 pg (ref 26.6–33.0)
MCHC: 30.4 g/dL — ABNORMAL LOW (ref 31.5–35.7)
MCV: 100 fL — ABNORMAL HIGH (ref 79–97)
Monocytes Absolute: 0.5 x10E3/uL (ref 0.1–0.9)
Monocytes: 6 %
Neutrophils Absolute: 5.8 x10E3/uL (ref 1.4–7.0)
Neutrophils: 68 %
Platelets: 273 x10E3/uL (ref 150–450)
RBC: 2.37 x10E6/uL — CL (ref 3.77–5.28)
RDW: 15.6 % — ABNORMAL HIGH (ref 11.7–15.4)
WBC: 8.4 x10E3/uL (ref 3.4–10.8)

## 2024-01-30 LAB — IRON: Iron: 18 ug/dL — ABNORMAL LOW (ref 27–159)

## 2024-01-30 NOTE — Progress Notes (Signed)
 Please inform patient,  Your hemoglobin is 7.2, which is lower than before and trending down. Your results may be related to low iron . We don't need a transfusion right now, but we will repeat your blood test in one week to monitor it. I've also placed a referral to Hematology for further evaluation. If you notice shortness of breath, chest pain, dizziness, or severe weakness, please visit ED   We will need Recheck labs again in 1 week. Please follow up in 1 week for repeat blood work ONLY. You can walk in no appointment needed clinic hours: Monday-Friday 8-11 or 1-4 pm.    I encourage to take over the counter iron  tablets 65 mg once daily.       Gastrointestinal side effects are common with Iron  tablet intake such as : constipation, nausea, abdominal discomfort, and dark stools.  Manage Constipation Tips: Hydration: Drink plenty of water  throughout the day. High-Fiber Diet: Include fruits, vegetables, and whole grains to promote bowel movements. Stool Softeners or Laxatives OTC: Docusate sodium  (Colace): A gentle stool softener. Polyethylene glycol (MiraLAX ): Effective for occasional constipation.

## 2024-02-03 ENCOUNTER — Encounter (INDEPENDENT_AMBULATORY_CARE_PROVIDER_SITE_OTHER): Payer: Self-pay | Admitting: *Deleted

## 2024-02-03 ENCOUNTER — Telehealth: Payer: Self-pay

## 2024-02-03 NOTE — Telephone Encounter (Signed)
 Copied from CRM [id number]. Topic: Clinical - Medical Advice >> Feb 03, 2024 12:46 PM Winona R wrote: Pt requested Philippe at the start of the call but states her catheter is leaking and would like to know if a home nurse can be sent out to her home like they use to. She believes the cath needs to be changed as she wakes up to a wet bed and pants. Pt states she would like to get this taken care of this week.

## 2024-02-04 NOTE — Telephone Encounter (Signed)
 [name] will call [facility] and put in the order- thank you

## 2024-02-04 NOTE — Telephone Encounter (Signed)
 With [facility] and added verbal catheter care orders

## 2024-02-04 NOTE — Telephone Encounter (Signed)
Pt informed

## 2024-02-05 DIAGNOSIS — Z435 Encounter for attention to cystostomy: Secondary | ICD-10-CM

## 2024-02-05 DIAGNOSIS — M86252 Subacute osteomyelitis, left femur: Secondary | ICD-10-CM | POA: Diagnosis not present

## 2024-02-05 DIAGNOSIS — S24103S Unspecified injury at T7-T10 level of thoracic spinal cord, sequela: Secondary | ICD-10-CM

## 2024-02-05 DIAGNOSIS — G8220 Paraplegia, unspecified: Secondary | ICD-10-CM

## 2024-02-05 DIAGNOSIS — I132 Hypertensive heart and chronic kidney disease with heart failure and with stage 5 chronic kidney disease, or end stage renal disease: Secondary | ICD-10-CM

## 2024-02-05 DIAGNOSIS — I5030 Unspecified diastolic (congestive) heart failure: Secondary | ICD-10-CM

## 2024-02-05 DIAGNOSIS — N319 Neuromuscular dysfunction of bladder, unspecified: Secondary | ICD-10-CM

## 2024-02-05 DIAGNOSIS — N186 End stage renal disease: Secondary | ICD-10-CM

## 2024-02-05 DIAGNOSIS — Z452 Encounter for adjustment and management of vascular access device: Secondary | ICD-10-CM | POA: Diagnosis not present

## 2024-02-05 DIAGNOSIS — L89224 Pressure ulcer of left hip, stage 4: Secondary | ICD-10-CM | POA: Diagnosis not present

## 2024-02-05 DIAGNOSIS — Z466 Encounter for fitting and adjustment of urinary device: Secondary | ICD-10-CM

## 2024-02-05 DIAGNOSIS — M00852 Arthritis due to other bacteria, left hip: Secondary | ICD-10-CM | POA: Diagnosis not present

## 2024-02-06 ENCOUNTER — Other Ambulatory Visit: Payer: Self-pay | Admitting: Family Medicine

## 2024-02-06 DIAGNOSIS — I10 Essential (primary) hypertension: Secondary | ICD-10-CM

## 2024-02-11 ENCOUNTER — Telehealth: Payer: Self-pay

## 2024-02-11 NOTE — Telephone Encounter (Signed)
 Verbal orders provided.

## 2024-02-11 NOTE — Telephone Encounter (Signed)
 Copied from CRM #[id number]. Topic: Clinical - Home Health Verbal Orders >> Feb 11, 2024  8:46 AM Wyona SQUIBB wrote: Caller/Agency: [facility]  Callback Number: [phone] - secured line  Service Requested: Skilled Nursing Frequency: verbal order to change catheder monthly.  Any new concerns about the patient? No

## 2024-02-16 ENCOUNTER — Encounter: Payer: Self-pay | Admitting: Orthopedic Surgery

## 2024-02-16 ENCOUNTER — Other Ambulatory Visit: Payer: Self-pay | Admitting: Family Medicine

## 2024-02-24 ENCOUNTER — Inpatient Hospital Stay: Admitting: Oncology

## 2024-02-24 ENCOUNTER — Inpatient Hospital Stay

## 2024-02-24 ENCOUNTER — Inpatient Hospital Stay: Attending: Oncology | Admitting: Oncology

## 2024-02-24 NOTE — Assessment & Plan Note (Signed)
 Recently treated with broad-spectrum antibiotics for osteomyelitis. Currently has wound VAC.  Has home health who clean and help with her care.
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
## 2024-02-24 NOTE — Assessment & Plan Note (Signed)
 Patient has had chronic anemia for many years.  Etiology likely multifactorial including end-stage renal disease along with iron  deficiency. Most recent labs show hemoglobin between 7 and 8. She has received multiple blood transfusions within the past couple of months. Denies any bright red blood per rectum, melena or hematochezia. No recent colonoscopy.  She had an upper EGD for nausea/vomiting and weight loss on 12/03/2022 which revealed erosive gastropathy with no stigmata of recent bleeding.

## 2024-02-25 NOTE — Patient Instructions (Addendum)
 Right ischium,Right buttock,and left knee- wash with soap and water , paint with betadine, cover with ABD pad, and secure with tape. Change once daily.   Left hip: wash with soap and water , paint wound with betadine and allow to dry, then pack wound with black wound vac foam, and cover with vac drape. Continue wound vac at 125 mmHg and change 3 x week.  Left ischium - bordered foam and change every other day.   See Dr Lang at next available time  PROTEIN  Increase protein intake to at least 50 grams daily.  You can do this by adding lean meats, eggs, yogurt, beans, low fat dairy products or protein shakes.  If able, take a multi-vitamin, especially one with zinc  and vitamin C , over the counter to help with wound healing.  If you have diabetes, check your blood sugars as directed by your primary care MD and keep them under control.  If your blood sugars are consistently too high, your wound will not heal.  .

## 2024-03-05 ENCOUNTER — Inpatient Hospital Stay: Admitting: Hematology

## 2024-03-05 ENCOUNTER — Encounter (HOSPITAL_COMMUNITY): Payer: Self-pay

## 2024-03-05 ENCOUNTER — Inpatient Hospital Stay

## 2024-03-05 ENCOUNTER — Emergency Department (HOSPITAL_COMMUNITY): Admission: EM | Admit: 2024-03-05 | Discharge: 2024-03-05 | Disposition: A | Discharge status: Home / Self Care

## 2024-03-05 ENCOUNTER — Emergency Department (HOSPITAL_COMMUNITY)

## 2024-03-05 ENCOUNTER — Other Ambulatory Visit: Payer: Self-pay

## 2024-03-05 DIAGNOSIS — I120 Hypertensive chronic kidney disease with stage 5 chronic kidney disease or end stage renal disease: Secondary | ICD-10-CM | POA: Insufficient documentation

## 2024-03-05 DIAGNOSIS — R519 Headache, unspecified: Secondary | ICD-10-CM

## 2024-03-05 DIAGNOSIS — N186 End stage renal disease: Secondary | ICD-10-CM | POA: Insufficient documentation

## 2024-03-05 DIAGNOSIS — Z79899 Other long term (current) drug therapy: Secondary | ICD-10-CM | POA: Diagnosis not present

## 2024-03-05 DIAGNOSIS — X58XXXA Exposure to other specified factors, initial encounter: Secondary | ICD-10-CM | POA: Insufficient documentation

## 2024-03-05 DIAGNOSIS — Z8542 Personal history of malignant neoplasm of other parts of uterus: Secondary | ICD-10-CM | POA: Insufficient documentation

## 2024-03-05 DIAGNOSIS — G4489 Other headache syndrome: Secondary | ICD-10-CM | POA: Diagnosis not present

## 2024-03-05 DIAGNOSIS — Z992 Dependence on renal dialysis: Secondary | ICD-10-CM | POA: Diagnosis not present

## 2024-03-05 DIAGNOSIS — S31000A Unspecified open wound of lower back and pelvis without penetration into retroperitoneum, initial encounter: Secondary | ICD-10-CM | POA: Diagnosis not present

## 2024-03-05 DIAGNOSIS — S3992XA Unspecified injury of lower back, initial encounter: Secondary | ICD-10-CM | POA: Diagnosis present

## 2024-03-05 LAB — COMPREHENSIVE METABOLIC PANEL WITH GFR
ALT: 9 U/L (ref 0–44)
AST: 17 U/L (ref 15–41)
Albumin: 3.1 g/dL — ABNORMAL LOW (ref 3.5–5.0)
Alkaline Phosphatase: 71 U/L (ref 38–126)
Anion gap: 15 (ref 5–15)
BUN: 40 mg/dL — ABNORMAL HIGH (ref 6–20)
CO2: 25 mmol/L (ref 22–32)
Calcium: 9.2 mg/dL (ref 8.9–10.3)
Chloride: 98 mmol/L (ref 98–111)
Creatinine, Ser: 5.16 mg/dL — ABNORMAL HIGH (ref 0.44–1.00)
GFR, Estimated: 9 mL/min — ABNORMAL LOW (ref >60)
Glucose, Bld: 87 mg/dL (ref 70–99)
Potassium: 4.8 mmol/L (ref 3.5–5.1)
Sodium: 139 mmol/L (ref 135–145)
Total Bilirubin: 0.3 mg/dL (ref 0.0–1.2)
Total Protein: 6.6 g/dL (ref 6.5–8.1)

## 2024-03-05 LAB — CBC WITH DIFFERENTIAL/PLATELET
Abs Immature Granulocytes: 0.06 K/uL (ref 0.00–0.07)
Basophils Absolute: 0.1 K/uL (ref 0.0–0.1)
Basophils Relative: 1 %
Eosinophils Absolute: 0.4 K/uL (ref 0.0–0.5)
Eosinophils Relative: 6 %
HCT: 30.1 % — ABNORMAL LOW (ref 36.0–46.0)
Hemoglobin: 9 g/dL — ABNORMAL LOW (ref 12.0–15.0)
Immature Granulocytes: 1 %
Lymphocytes Relative: 20 %
Lymphs Abs: 1.4 K/uL (ref 0.7–4.0)
MCH: 29.2 pg (ref 26.0–34.0)
MCHC: 29.9 g/dL — ABNORMAL LOW (ref 30.0–36.0)
MCV: 97.7 fL (ref 80.0–100.0)
Monocytes Absolute: 0.4 K/uL (ref 0.1–1.0)
Monocytes Relative: 6 %
Neutro Abs: 4.6 K/uL (ref 1.7–7.7)
Neutrophils Relative %: 66 %
Platelets: 221 K/uL (ref 150–400)
RBC: 3.08 MIL/uL — ABNORMAL LOW (ref 3.87–5.11)
RDW: 17.6 % — ABNORMAL HIGH (ref 11.5–15.5)
WBC: 6.9 K/uL (ref 4.0–10.5)
nRBC: 0 % (ref 0.0–0.2)

## 2024-03-05 LAB — LIPASE, BLOOD: Lipase: 59 U/L — ABNORMAL HIGH (ref 11–51)

## 2024-03-05 MED ORDER — OXYCODONE HCL 5 MG PO TABS
5.0000 mg | ORAL_TABLET | Freq: Once | ORAL | Status: AC
  Administered 2024-03-05: 5 mg via ORAL
  Filled 2024-03-05: qty 1

## 2024-03-05 MED ORDER — ACETAMINOPHEN 500 MG PO TABS
1000.0000 mg | ORAL_TABLET | Freq: Once | ORAL | Status: AC
  Administered 2024-03-05: 1000 mg via ORAL
  Filled 2024-03-05: qty 2

## 2024-03-05 NOTE — ED Notes (Signed)
 Daughter was called to let her know patient is on the way back home.

## 2024-03-05 NOTE — ED Triage Notes (Signed)
 Pt BIB [facility] with complaints of a headache. Headache started this morning.

## 2024-03-05 NOTE — Discharge Instructions (Addendum)
 Currently, your skin and wounds look appropriately healed.  No obvious infection is time.  If you have repeated temperatures of over 100.3 if you start feeling bad such as any kind of lightheadedness, fever, chills then please come back to the ED.  You have a bunch of risk factors for infection but currently there is no evidence of this.   Please go to dialysis tomorrow as we discussed.  You have an appointment at 9 AM.  Lease follow-up with all your infectious disease appointments.  Please follow-up with all your orthopedic appointments

## 2024-03-05 NOTE — ED Provider Notes (Signed)
 [place] EMERGENCY DEPARTMENT AT [facility] Provider Note   CSN: [id number] Arrival date & time: 03/05/24  9052     Patient presents with: Headache   [name] is a 58 y.o. female.    Headache   Presents because of headache.  Patient states that she was go dialysis today but subsequent was sent here instead.  Patient states that when the nurses today checked her temperature and it was around 100 F.  Was not above 100.3.  Subsequently was sent to the ED for further evaluation.  She endorses a mild headache but otherwise no vision changes.  No neck pain with flexion.  Does not experience any kind of fever or chills this point in time.  Denies all chest pain or shortness of breath.  No nausea vomit diarrhea.  Patient states that she is been having her wounds clean as ordered.  Last, denies all complaints.   Previous medical history reviewed : Patient had MRI brain in May 2025.  Chronic microvascular ischemic changes but otherwise unremarkable.  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  Patient was seen by PCP on February 25, 2024. PMHx significant for ESRD on HD MWF, spinal cord injury at T7 with paraplegia, chronic decubitus ulcers, neurogenic bladder with suprapubic catheter, hypertension, chronic anemia, uterine cancer, seizures, recurrent UTIs who has been following up with ID regarding left femur osteomyelitis.  Patient underwent I&D and Girdlestone with plastic surgery orthopedics on August 15 at [facility].  Intraoperative cultures negative.  Completed 7-day course of Flagyl  for Bacteroides bacteremia and transition to vancomycin  and cefepime .  Transition to oral Keflex  to complete a 6-week course.  Still take an oral Keflex  and fluconazole without issues.  Last dose of Keflex  was on October 1.     Prior to Admission medications   Medication Sig Start Date End Date Taking? Authorizing Provider  acetaminophen  (TYLENOL ) 500 MG tablet Take 1,000 mg by mouth every 6  (six) hours as needed for moderate pain.    [provider]  albuterol  (PROVENTIL ) (2.5 MG/3ML) 0.083% nebulizer solution Take 3 mLs (2.5 mg total) by nebulization every 4 (four) hours as needed for wheezing or shortness of breath. 04/15/23 04/14/24  Pearlean Manus, MD  amLODipine  (NORVASC ) 10 MG tablet Take 1 tablet (10 mg total) by mouth every evening. Take in the evenings -- 11/24/23   Pearlean Manus, MD  carvedilol  (COREG ) 12.5 MG tablet Take 1 tablet (12.5 mg total) by mouth 2 (two) times daily with a meal. 11/24/23   Emokpae, Courage, MD  cefTRIAXone  2 g in sodium chloride  0.9 % 100 mL Inject 2 g into the vein daily. 01/01/24   Evonnie Lenis, MD  cyclobenzaprine  (FLEXERIL ) 5 MG tablet Take 1 tablet (5 mg total) by mouth 2 (two) times daily as needed for muscle spasms. 11/24/23   Pearlean Manus, MD  gabapentin  (NEURONTIN ) 100 MG capsule Take 1 capsule (100 mg total) by mouth 2 (two) times daily. 02/17/24   Bacchus, Meade PEDLAR, FNP  levETIRAcetam  (KEPPRA ) 500 MG tablet Take 2 tablets (1,000 mg total) by mouth every evening. Take an additional tablet on Monday,Wednesday and Friday evening after Dialysis 11/24/23   Pearlean Manus, MD  lidocaine -prilocaine  (EMLA ) cream Apply 1 Application topically 3 (three) times a week. 06/04/22   [provider]  multivitamin (RENA-VIT) TABS tablet Take 1 tablet by mouth daily. 07/29/23   [provider]  pantoprazole  (PROTONIX ) 40 MG tablet Take 1 tablet (40 mg total) by mouth daily.  11/24/23   Pearlean Manus, MD  senna-docusate (SENOKOT-S) 8.6-50 MG tablet Take 2 tablets by mouth at bedtime. 11/24/23   Pearlean Manus, MD  sevelamer  carbonate (RENVELA ) 800 MG tablet Take 800 mg by mouth 3 (three) times daily. 12/11/22   [provider]  vancomycin  (VANCOREADY) 500 MG/100ML IVPB Inject 100 mLs (500 mg total) into the vein Every Tuesday,Thursday,and Saturday with dialysis. Patient not taking: Reported on 01/29/2024 01/01/24   Evonnie Lenis, MD     Allergies: Cefepime , Linezolid, Moxifloxacin, Quinine derivatives, Vancomycin , Azithromycin , Benadryl [diphenhydramine hcl (sleep)], Daptomycin, Tetracycline, and Zosyn  [piperacillin  sod-tazobactam so]    Review of Systems  Neurological:  Positive for headaches.    Updated Vital Signs BP (!) 150/77   Pulse 78   Temp 97.6 F (36.4 C) (Oral)   Resp 16   Ht 5' 1 (1.549 m)   Wt 44.9 kg   LMP  (LMP Unknown)   SpO2 96%   BMI 18.71 kg/m   Physical Exam Vitals and nursing note reviewed.  Constitutional:      General: She is not in acute distress.    Appearance: She is well-developed.  HENT:     Head: Normocephalic and atraumatic.  Eyes:     Conjunctiva/sclera: Conjunctivae normal.  Cardiovascular:     Rate and Rhythm: Normal rate and regular rhythm.     Heart sounds: No murmur heard. Pulmonary:     Effort: Pulmonary effort is normal. No respiratory distress.     Breath sounds: Normal breath sounds.  Abdominal:     Palpations: Abdomen is soft.     Tenderness: There is no abdominal tenderness.  Musculoskeletal:        General: No swelling.     Cervical back: Neck supple.       Legs:  Skin:    General: Skin is warm and dry.     Capillary Refill: Capillary refill takes less than 2 seconds.  Neurological:     Mental Status: She is alert.  Psychiatric:        Mood and Affect: Mood normal.     (all labs ordered are listed, but only abnormal results are displayed) Labs Reviewed  CBC WITH DIFFERENTIAL/PLATELET - Abnormal; Notable for the following components:      Result Value   RBC 3.08 (*)    Hemoglobin 9.0 (*)    HCT 30.1 (*)    MCHC 29.9 (*)    RDW 17.6 (*)    All other components within normal limits  COMPREHENSIVE METABOLIC PANEL WITH GFR - Abnormal; Notable for the following components:   BUN 40 (*)    Creatinine, Ser 5.16 (*)    Albumin  3.1 (*)    GFR, Estimated 9 (*)    All other components within normal limits  LIPASE, BLOOD - Abnormal; Notable for  the following components:   Lipase 59 (*)    All other components within normal limits  URINALYSIS, ROUTINE W REFLEX MICROSCOPIC    EKG: None  Radiology: DG Chest Portable 1 View Result Date: 03/05/2024 CLINICAL DATA:  Headache. EXAM: PORTABLE CHEST 1 VIEW COMPARISON:  December 31, 2023 FINDINGS: The cardiac silhouette is enlarged and unchanged in size. Mild atelectatic changes are seen within the right lung base. There is a small, stable right pleural effusion versus pleural thickening. No pneumothorax is identified. A chronic deformity of the right clavicle is present. Postoperative changes are seen throughout the thoracic spine. IMPRESSION: 1. Mild right basilar atelectasis. 2. Small, stable right pleural effusion  versus pleural thickening. Electronically Signed   By: Suzen Dials M.D.   On: 03/05/2024 12:22     Procedures   Medications Ordered in the ED  acetaminophen  (TYLENOL ) tablet 1,000 mg (1,000 mg Oral Given 03/05/24 1209)  oxyCODONE  (Oxy IR/ROXICODONE ) immediate release tablet 5 mg (5 mg Oral Given 03/05/24 1345)                                    Medical Decision Making Amount and/or Complexity of Data Reviewed Labs: ordered. Radiology: ordered.  Risk OTC drugs. Prescription drug management.       HPI:   Presents because of headache.  Patient states that she was go dialysis today but subsequent was sent here instead.  Patient states that when the nurses today checked her temperature and it was around 100 F.  Was not above 100.3.  Subsequently was sent to the ED for further evaluation.  She endorses a mild headache but otherwise no vision changes.  No neck pain with flexion.  Does not experience any kind of fever or chills this point in time.  Denies all chest pain or shortness of breath.  No nausea vomit diarrhea.  Patient states that she is been having her wounds clean as ordered.  Last, denies all complaints.   Previous medical history reviewed : Patient  had MRI brain in May 2025.  Chronic microvascular ischemic changes but otherwise unremarkable.  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  Patient was seen by PCP on February 25, 2024. PMHx significant for ESRD on HD MWF, spinal cord injury at T7 with paraplegia, chronic decubitus ulcers, neurogenic bladder with suprapubic catheter, hypertension, chronic anemia, uterine cancer, seizures, recurrent UTIs who has been following up with ID regarding left femur osteomyelitis.  Patient underwent I&D and Girdlestone with plastic surgery orthopedics on August 15 at [facility].  Intraoperative cultures negative.  Completed 7-day course of Flagyl  for Bacteroides bacteremia and transition to vancomycin  and cefepime .  Transition to oral Keflex  to complete a 6-week course.  Still take an oral Keflex  and fluconazole without issues.  Last dose of Keflex  was on October 1.    MDM:    On exam, patient hemodynamically stable.  Afebrile.  No tachycardia or tachypnea.  Patient has risk factors for infection given past medical history as above.  Currently, patient afebrile throughout the ED stay.  Remained afebrile throughout the whole ED stay.  She has no leukocytosis.  No left shift.  Wounds look like they are healing.  No purulent discharge from the sacral wounds or where the wound VAC is at.  Skin is healing.  No obvious infection source at this point time.  She denies all complaints of fever or chills.  Feels fine in terms of this perspective.  Has absolutely no complaints in terms of fever chills or other systemic illness.  Therefore, do not think we need to obtain blood ultras.  Spoke to patient and daughter at length that she has documented fevers at home especially 2 in a row then please come back to the ED immediately.  She has about her risk factors for subsequent infections by this point time, no obvious infection given multiple checks of patient's temperature and the fact that she is remained so hemodynamically stay  without any kind of tachycardia or tachypnea.  Laboratory workup shows around patient's baseline.  Hemoglobin around patient's baseline.  No evidence of large  hemoglobin drop.  Patient did miss dialysis today.  No large electrolyte derangements.  No significant acidosis.  Patient called dialysis unit while she was here in the ED and has subsequent follow-up tomorrow morning at 9 AM for dialysis.  No indication to stay here in the ED or inpatient for dialysis.  Euvolemic.  Lipase to 59.  Nonspecific.  No nausea no vomiting.  Tolerating p.o.  Has been slightly elevated in the past.  No indication for CT imaging of the abdomen is point time.   Discussed this with the patient and checked on her multiple times.  Still feeling fine.  Endorses a generalized headache that she states that gets a lot.  No changes with his headache.  No diplopia.  No neck pain.  No concerns for meningitis or other infectious etiology.  Patient responded well to Tylenol  and oxycodone  for the headache.  Patient will follow-up with infectious disease.      Cardiac Tele Interpreted by Me: sinus rhythm.    I have independently interpreted the CXR     Disposition and Follow Up: discharge        Final diagnoses:  Nonintractable headache, unspecified chronicity pattern, unspecified headache type  Wound of sacral region, initial encounter  Other headache syndrome    ED Discharge Orders     None          Simon Lavonia SAILOR, MD 03/05/24 1551

## 2024-03-05 NOTE — ED Notes (Signed)
 New foley bag placed on patient for urine specimen.

## 2024-03-05 NOTE — ED Notes (Signed)
 Checked pt catheter for urine output and there was no urine present

## 2024-03-08 ENCOUNTER — Ambulatory Visit: Admitting: Urology

## 2024-03-08 ENCOUNTER — Telehealth: Payer: Self-pay

## 2024-03-08 DIAGNOSIS — R339 Retention of urine, unspecified: Secondary | ICD-10-CM

## 2024-03-08 NOTE — Telephone Encounter (Signed)
 Copied from CRM [id number]. Topic: Clinical - Medication Question >> Mar 08, 2024 11:21 AM [place] MATSU wrote: Reason for CRM:  [facility] called in requesting up to date medication list for patient. Per pharmacy they just want to confirm patients meds are up to date.   May you please fax med list to the below.    Fax: [phone]  Thank you

## 2024-03-08 NOTE — Telephone Encounter (Signed)
 faxed

## 2024-03-09 ENCOUNTER — Emergency Department (HOSPITAL_COMMUNITY)

## 2024-03-09 ENCOUNTER — Other Ambulatory Visit: Payer: Self-pay

## 2024-03-09 ENCOUNTER — Encounter (HOSPITAL_COMMUNITY): Payer: Self-pay

## 2024-03-09 ENCOUNTER — Inpatient Hospital Stay (HOSPITAL_COMMUNITY)
Admission: EM | Admit: 2024-03-09 | Discharge: 2024-03-13 | DRG: 539 | Disposition: A | Discharge status: Home Health | Attending: Internal Medicine | Admitting: Internal Medicine

## 2024-03-09 DIAGNOSIS — Z9359 Other cystostomy status: Secondary | ICD-10-CM

## 2024-03-09 DIAGNOSIS — G40909 Epilepsy, unspecified, not intractable, without status epilepticus: Secondary | ICD-10-CM | POA: Diagnosis present

## 2024-03-09 DIAGNOSIS — L89313 Pressure ulcer of right buttock, stage 3: Secondary | ICD-10-CM | POA: Diagnosis present

## 2024-03-09 DIAGNOSIS — Z888 Allergy status to other drugs, medicaments and biological substances status: Secondary | ICD-10-CM

## 2024-03-09 DIAGNOSIS — I5032 Chronic diastolic (congestive) heart failure: Secondary | ICD-10-CM | POA: Diagnosis present

## 2024-03-09 DIAGNOSIS — R1013 Epigastric pain: Secondary | ICD-10-CM | POA: Diagnosis not present

## 2024-03-09 DIAGNOSIS — G8220 Paraplegia, unspecified: Secondary | ICD-10-CM | POA: Diagnosis present

## 2024-03-09 DIAGNOSIS — Z833 Family history of diabetes mellitus: Secondary | ICD-10-CM

## 2024-03-09 DIAGNOSIS — I10 Essential (primary) hypertension: Secondary | ICD-10-CM | POA: Diagnosis present

## 2024-03-09 DIAGNOSIS — L89323 Pressure ulcer of left buttock, stage 3: Secondary | ICD-10-CM | POA: Diagnosis present

## 2024-03-09 DIAGNOSIS — R112 Nausea with vomiting, unspecified: Principal | ICD-10-CM

## 2024-03-09 DIAGNOSIS — Z9981 Dependence on supplemental oxygen: Secondary | ICD-10-CM

## 2024-03-09 DIAGNOSIS — M86652 Other chronic osteomyelitis, left thigh: Principal | ICD-10-CM | POA: Diagnosis present

## 2024-03-09 DIAGNOSIS — J9611 Chronic respiratory failure with hypoxia: Secondary | ICD-10-CM | POA: Diagnosis present

## 2024-03-09 DIAGNOSIS — Z992 Dependence on renal dialysis: Secondary | ICD-10-CM

## 2024-03-09 DIAGNOSIS — M609 Myositis, unspecified: Secondary | ICD-10-CM | POA: Diagnosis present

## 2024-03-09 DIAGNOSIS — N186 End stage renal disease: Secondary | ICD-10-CM | POA: Diagnosis present

## 2024-03-09 DIAGNOSIS — S069XAA Unspecified intracranial injury with loss of consciousness status unknown, initial encounter: Secondary | ICD-10-CM | POA: Diagnosis present

## 2024-03-09 DIAGNOSIS — I132 Hypertensive heart and chronic kidney disease with heart failure and with stage 5 chronic kidney disease, or end stage renal disease: Secondary | ICD-10-CM | POA: Diagnosis present

## 2024-03-09 DIAGNOSIS — Z681 Body mass index (BMI) 19 or less, adult: Secondary | ICD-10-CM

## 2024-03-09 DIAGNOSIS — Z7952 Long term (current) use of systemic steroids: Secondary | ICD-10-CM

## 2024-03-09 DIAGNOSIS — R7881 Bacteremia: Secondary | ICD-10-CM | POA: Diagnosis present

## 2024-03-09 DIAGNOSIS — M869 Osteomyelitis, unspecified: Secondary | ICD-10-CM | POA: Diagnosis present

## 2024-03-09 DIAGNOSIS — Z881 Allergy status to other antibiotic agents status: Secondary | ICD-10-CM

## 2024-03-09 DIAGNOSIS — Z79899 Other long term (current) drug therapy: Secondary | ICD-10-CM

## 2024-03-09 DIAGNOSIS — E440 Moderate protein-calorie malnutrition: Secondary | ICD-10-CM | POA: Diagnosis present

## 2024-03-09 DIAGNOSIS — L89159 Pressure ulcer of sacral region, unspecified stage: Secondary | ICD-10-CM | POA: Diagnosis present

## 2024-03-09 DIAGNOSIS — L89309 Pressure ulcer of unspecified buttock, unspecified stage: Secondary | ICD-10-CM | POA: Diagnosis present

## 2024-03-09 DIAGNOSIS — Z8249 Family history of ischemic heart disease and other diseases of the circulatory system: Secondary | ICD-10-CM

## 2024-03-09 DIAGNOSIS — Z87820 Personal history of traumatic brain injury: Secondary | ICD-10-CM

## 2024-03-09 DIAGNOSIS — D631 Anemia in chronic kidney disease: Secondary | ICD-10-CM | POA: Diagnosis present

## 2024-03-09 LAB — URINALYSIS, ROUTINE W REFLEX MICROSCOPIC
Bilirubin Urine: NEGATIVE
Glucose, UA: >=500 mg/dL — AB
Ketones, ur: NEGATIVE mg/dL
Nitrite: NEGATIVE
Protein, ur: >=300 mg/dL — AB
Specific Gravity, Urine: 1.006 (ref 1.005–1.030)
WBC, UA: >50 WBC/hpf (ref 0–5)
pH: 9 — ABNORMAL HIGH (ref 5.0–8.0)

## 2024-03-09 LAB — CBC WITH DIFFERENTIAL/PLATELET
Abs Immature Granulocytes: 0.02 K/uL (ref 0.00–0.07)
Basophils Absolute: 0 K/uL (ref 0.0–0.1)
Basophils Relative: 0 %
Eosinophils Absolute: 0.2 K/uL (ref 0.0–0.5)
Eosinophils Relative: 3 %
HCT: 32.1 % — ABNORMAL LOW (ref 36.0–46.0)
Hemoglobin: 9.5 g/dL — ABNORMAL LOW (ref 12.0–15.0)
Immature Granulocytes: 0 %
Lymphocytes Relative: 14 %
Lymphs Abs: 1.1 K/uL (ref 0.7–4.0)
MCH: 29 pg (ref 26.0–34.0)
MCHC: 29.6 g/dL — ABNORMAL LOW (ref 30.0–36.0)
MCV: 97.9 fL (ref 80.0–100.0)
Monocytes Absolute: 0.4 K/uL (ref 0.1–1.0)
Monocytes Relative: 5 %
Neutro Abs: 5.9 K/uL (ref 1.7–7.7)
Neutrophils Relative %: 78 %
Platelets: 204 K/uL (ref 150–400)
RBC: 3.28 MIL/uL — ABNORMAL LOW (ref 3.87–5.11)
RDW: 17.9 % — ABNORMAL HIGH (ref 11.5–15.5)
WBC: 7.6 K/uL (ref 4.0–10.5)
nRBC: 0 % (ref 0.0–0.2)

## 2024-03-09 LAB — LIPASE, BLOOD: Lipase: 31 U/L (ref 11–51)

## 2024-03-09 LAB — COMPREHENSIVE METABOLIC PANEL WITH GFR
ALT: 9 U/L (ref 0–44)
AST: 18 U/L (ref 15–41)
Albumin: 3.3 g/dL — ABNORMAL LOW (ref 3.5–5.0)
Alkaline Phosphatase: 78 U/L (ref 38–126)
Anion gap: 10 (ref 5–15)
BUN: 20 mg/dL (ref 6–20)
CO2: 29 mmol/L (ref 22–32)
Calcium: 9.6 mg/dL (ref 8.9–10.3)
Chloride: 98 mmol/L (ref 98–111)
Creatinine, Ser: 3.4 mg/dL — ABNORMAL HIGH (ref 0.44–1.00)
GFR, Estimated: 15 mL/min — ABNORMAL LOW (ref >60)
Glucose, Bld: 80 mg/dL (ref 70–99)
Potassium: 4.3 mmol/L (ref 3.5–5.1)
Sodium: 138 mmol/L (ref 135–145)
Total Bilirubin: 0.4 mg/dL (ref 0.0–1.2)
Total Protein: 6.9 g/dL (ref 6.5–8.1)

## 2024-03-09 LAB — TROPONIN T, HIGH SENSITIVITY
Troponin T High Sensitivity: 86 ng/L — ABNORMAL HIGH (ref 0–19)
Troponin T High Sensitivity: 86 ng/L — ABNORMAL HIGH (ref 0–19)

## 2024-03-09 MED ORDER — ONDANSETRON 4 MG PO TBDP: ORAL_TABLET | ORAL | 0 refills | Status: DC

## 2024-03-09 MED ORDER — PANTOPRAZOLE SODIUM 20 MG PO TBEC: 20.0000 mg | DELAYED_RELEASE_TABLET | Freq: Every day | ORAL | 0 refills | Status: DC

## 2024-03-09 MED ORDER — ONDANSETRON HCL 4 MG/2ML IJ SOLN
4.0000 mg | Freq: Once | INTRAMUSCULAR | Status: AC
  Administered 2024-03-09: 4 mg via INTRAVENOUS
  Filled 2024-03-09: qty 2

## 2024-03-09 MED ORDER — AMLODIPINE BESYLATE 5 MG PO TABS
10.0000 mg | ORAL_TABLET | Freq: Once | ORAL | Status: AC
  Administered 2024-03-09: 10 mg via ORAL
  Filled 2024-03-09: qty 2

## 2024-03-09 MED ORDER — CARVEDILOL 12.5 MG PO TABS
12.5000 mg | ORAL_TABLET | Freq: Once | ORAL | Status: AC
Start: 2024-03-09 — End: 2024-03-09
  Administered 2024-03-09: 12.5 mg via ORAL
  Filled 2024-03-09: qty 1

## 2024-03-09 MED ORDER — HYDROXYZINE HCL 25 MG PO TABS
25.0000 mg | ORAL_TABLET | Freq: Once | ORAL | Status: AC
  Administered 2024-03-09: 25 mg via ORAL
  Filled 2024-03-09: qty 1

## 2024-03-09 MED ORDER — VANCOMYCIN HCL 500 MG/100ML IV SOLN
500.0000 mg | Freq: Once | INTRAVENOUS | Status: AC
  Administered 2024-03-09: 500 mg via INTRAVENOUS
  Filled 2024-03-09: qty 100

## 2024-03-09 MED ORDER — FAMOTIDINE 20 MG PO TABS
20.0000 mg | ORAL_TABLET | Freq: Once | ORAL | Status: AC
  Administered 2024-03-09: 20 mg via ORAL
  Filled 2024-03-09: qty 1

## 2024-03-09 NOTE — ED Provider Notes (Signed)
 Patient with possible osteomyelitis of the left hip.  She is to get an MRI of her hip tomorrow morning.  If patient does have osteomyelitis, there will be a need to get in touch with her doctors at [facility].  If she does not have osteomyelitis then she can be discharged home to follow-up with her  doctor for her nausea.  She needs to be started on a proton pump inhibitor and nausea medicine   Suzette Pac, MD 03/09/24 2158

## 2024-03-09 NOTE — ED Triage Notes (Signed)
 Pt arrived via [facility] from home c/o N/V, abdominal pain X 2 days. Pt has LUE restriction for dialysis and last received dialysis yesterday. Per [facility], Pt is also hypertensive with a BP 188/95.

## 2024-03-09 NOTE — ED Provider Notes (Addendum)
 [place] EMERGENCY DEPARTMENT AT [facility] Provider Note   CSN: [id number] Arrival date & time: 03/09/24  1313     Patient presents with: Emesis   [name] is a 58 y.o. female with a history including hypertension, seizure disorder, osteomyelitis left hip treated with girdlestone surgery who just completed abx,  paraplegia and kidney disease on dialysis, last received full dialysis treatment yesterday presenting for evaluation of a 2-day history of nausea vomiting and upper abdominal pain.  She describes sharp stabbing pain in her upper abdomen, at times it radiates into her chest but states she has chronic rib cage pain from a prior trauma and states the chest pain may be more chronic in nature.  She denies shortness of breath, she has had several episodes of emesis since yesterday.  No fevers or chills, denies abdominal distention, no diarrhea.  She does have an indwelling suprapubic catheter and does get occasional UTIs, she states her urine has not been excessively cloudy.  She has had no treatment for her symptoms prior to arrival.     The history is provided by the patient.       Prior to Admission medications   Medication Sig Start Date End Date Taking? Authorizing Provider  acetaminophen  (TYLENOL ) 500 MG tablet Take 1,000 mg by mouth every 6 (six) hours as needed for moderate pain.    [provider]  albuterol  (PROVENTIL ) (2.5 MG/3ML) 0.083% nebulizer solution Take 3 mLs (2.5 mg total) by nebulization every 4 (four) hours as needed for wheezing or shortness of breath. 04/15/23 04/14/24  Pearlean Manus, MD  amLODipine  (NORVASC ) 5 MG tablet Take 5 mg by mouth every evening. 02/09/24   [provider]  B Complex-C-Folic Acid  (DIALYVITE TABLET) TABS Take 1 tablet by mouth daily. 02/27/24   [provider]  carvedilol  (COREG ) 25 MG tablet Take 25 mg by mouth 2 (two) times daily. 02/03/24   [provider]  cefTRIAXone  2 g in  sodium chloride  0.9 % 100 mL Inject 2 g into the vein daily. 01/01/24   Evonnie Lenis, MD  cephALEXin  (KEFLEX ) 500 MG capsule Take 500 mg by mouth daily. 01/29/24   [provider]  cyclobenzaprine  (FLEXERIL ) 10 MG tablet Take 10 mg by mouth 3 (three) times daily as needed. 02/26/24   [provider]  fluconazole (DIFLUCAN) 100 MG tablet Take 200 mg by mouth daily. 01/29/24   [provider]  furosemide  (LASIX ) 80 MG tablet Take 40 mg by mouth 4 (four) times a week. 02/03/24   [provider]  gabapentin  (NEURONTIN ) 100 MG capsule Take 1 capsule (100 mg total) by mouth 2 (two) times daily. 02/17/24   Bacchus, Meade PEDLAR, FNP  hydrocortisone  2.5 % cream Apply 1 Application topically 2 (two) times daily. 01/29/24   [provider]  hydrOXYzine  (ATARAX ) 10 MG tablet Take 10 mg by mouth every 8 (eight) hours as needed for itching. 01/29/24   [provider]  irbesartan  (AVAPRO ) 150 MG tablet Take 150 mg by mouth every evening. 02/03/24   [provider]  levETIRAcetam  (KEPPRA ) 1000 MG tablet Take 1,000 mg by mouth at bedtime.    [provider]  lidocaine -prilocaine  (EMLA ) cream Apply 1 Application topically 3 (three) times a week. 06/04/22   [provider]  pantoprazole  (PROTONIX ) 40 MG tablet Take 1 tablet (40 mg total) by mouth daily. 11/24/23   Pearlean Manus, MD  predniSONE  (DELTASONE ) 20 MG tablet Take 20 mg by mouth 3 (three)  times a week. 01/30/24   [provider]  senna-docusate (SENOKOT-S) 8.6-50 MG tablet Take 2 tablets by mouth at bedtime. 11/24/23   Pearlean Manus, MD  sevelamer  carbonate (RENVELA ) 800 MG tablet Take 800 mg by mouth 3 (three) times daily. 12/11/22   [provider]  vancomycin  (VANCOREADY) 500 MG/100ML IVPB Inject 100 mLs (500 mg total) into the vein Every Tuesday,Thursday,and Saturday with dialysis. Patient not taking: Reported on 01/29/2024 01/01/24   Evonnie Lenis, MD    Allergies: Other, Cefepime ,  Linezolid, Moxifloxacin, Quinine derivatives, Vancomycin , Azithromycin , Benadryl [diphenhydramine hcl (sleep)], Daptomycin, Piperacillin , Tetracycline, and Zosyn  [piperacillin  sod-tazobactam so]    Review of Systems  Constitutional:  Negative for chills and fever.  HENT:  Negative for congestion and sore throat.   Eyes: Negative.   Respiratory:  Negative for chest tightness and shortness of breath.   Cardiovascular:  Positive for chest pain. Negative for palpitations and leg swelling.  Gastrointestinal:  Positive for abdominal pain, nausea and vomiting. Negative for constipation and diarrhea.  Genitourinary: Negative.   Musculoskeletal:  Negative for arthralgias, joint swelling and neck pain.  Skin: Negative.  Negative for rash and wound.  Neurological:  Negative for dizziness, weakness, light-headedness, numbness and headaches.  Psychiatric/Behavioral: Negative.      Updated Vital Signs BP (!) 184/94 (BP Location: Right Arm)   Pulse 86   Temp 98.3 F (36.8 C) (Oral)   Resp 17   Ht 5' 1 (1.549 m)   Wt 44.9 kg   LMP  (LMP Unknown)   SpO2 97%   BMI 18.70 kg/m   Physical Exam Vitals and nursing note reviewed.  Constitutional:      Appearance: She is well-developed.  HENT:     Head: Normocephalic and atraumatic.  Eyes:     Conjunctiva/sclera: Conjunctivae normal.  Cardiovascular:     Rate and Rhythm: Normal rate and regular rhythm.     Heart sounds: Normal heart sounds.  Pulmonary:     Effort: Pulmonary effort is normal.     Breath sounds: Normal breath sounds. No wheezing.  Abdominal:     General: Abdomen is protuberant. The ostomy site is clean. Bowel sounds are normal.     Palpations: Abdomen is soft.     Tenderness: There is abdominal tenderness in the epigastric area. There is no guarding. Negative signs include Murphy's sign.     Comments: Suprapubic catheter appears well cared for.   Genitourinary:    Comments: Soft fluctuance at coccyx,  no induration or  erythema.   Decubitus ulcers present.  Musculoskeletal:        General: Normal range of motion.     Cervical back: Normal range of motion.  Skin:    General: Skin is warm and dry.  Neurological:     Mental Status: She is alert.     (all labs ordered are listed, but only abnormal results are displayed) Labs Reviewed  CBC WITH DIFFERENTIAL/PLATELET - Abnormal; Notable for the following components:      Result Value   RBC 3.28 (*)    Hemoglobin 9.5 (*)    HCT 32.1 (*)    MCHC 29.6 (*)    RDW 17.9 (*)    All other components within normal limits  COMPREHENSIVE METABOLIC PANEL WITH GFR - Abnormal; Notable for the following components:   Creatinine, Ser 3.40 (*)    Albumin  3.3 (*)    GFR, Estimated 15 (*)    All other components within normal limits  URINALYSIS, ROUTINE  W REFLEX MICROSCOPIC - Abnormal; Notable for the following components:   APPearance CLOUDY (*)    pH 9.0 (*)    Glucose, UA >=500 (*)    Hgb urine dipstick SMALL (*)    Protein, ur >=300 (*)    Leukocytes,Ua MODERATE (*)    Bacteria, UA FEW (*)    Non Squamous Epithelial 0-5 (*)    All other components within normal limits  TROPONIN T, HIGH SENSITIVITY - Abnormal; Notable for the following components:   Troponin T High Sensitivity 86 (*)    All other components within normal limits  TROPONIN T, HIGH SENSITIVITY - Abnormal; Notable for the following components:   Troponin T High Sensitivity 86 (*)    All other components within normal limits  CULTURE, BLOOD (ROUTINE X 2)  CULTURE, BLOOD (ROUTINE X 2)  LIPASE, BLOOD    EKG: EKG Interpretation Date/Time:  Tuesday March 09 2024 15:04:47 EDT Ventricular Rate:  87 PR Interval:  141 QRS Duration:  110 QT Interval:  423 QTC Calculation: 509 R Axis:   -61  Text Interpretation: Sinus rhythm Left atrial enlargement Incomplete RBBB and LAFB Right ventricular hypertrophy Borderline prolonged QT interval Confirmed by Suzette Pac [phone]) on 03/09/2024 8:08:51  PM  Radiology: CT ABDOMEN PELVIS WO CONTRAST Result Date: 03/09/2024 EXAM: CT ABDOMEN AND PELVIS WITHOUT CONTRAST 03/09/2024 07:53:25 PM TECHNIQUE: CT of the abdomen and pelvis was performed without the administration of intravenous contrast. Multiplanar reformatted images are provided for review. Automated exposure control, iterative reconstruction, and/or weight-based adjustment of the mA/kV was utilized to reduce the radiation dose to as low as reasonably achievable. COMPARISON: CT abdomen and pelvis 12/31/2023. CLINICAL HISTORY: Abdominal pain, acute, nonlocalized. Pt arrived via [facility] from home c/o N/V, abdominal pain X 2 days. Pt has LUE restriction for dialysis and last received dialysis yesterday. Per [facility], Pt is also hypertensive with a BP 188/95. FINDINGS: LOWER CHEST: The heart is enlarged with a new small pericardial effusion. There is a stable small right pleural effusion with atelectasis. No acute abnormality. LIVER: The liver is unremarkable. GALLBLADDER AND BILE DUCTS: Gallbladder is unremarkable. No biliary ductal dilatation. SPLEEN: No acute abnormality. PANCREAS: No acute abnormality. ADRENAL GLANDS: No acute abnormality. KIDNEYS, URETERS AND BLADDER: Bilateral renal cysts are stable. No stones in the kidneys or ureters. No hydronephrosis. No perinephric or periureteral stranding. Urinary bladder is unremarkable. GI AND BOWEL: Stomach demonstrates no acute abnormality. There is no bowel obstruction. There is diffuse rectal wall thickening, particularly posteriorly. The appendix appears normal. PERITONEUM AND RETROPERITONEUM: No ascites. No free air. VASCULATURE: Aorta is normal in caliber. LYMPH NODES: No lymphadenopathy. REPRODUCTIVE ORGANS: No acute abnormality. BONES AND SOFT TISSUES: chronic posterior right hip dislocation and joint effusion are again noted. There has been interval erosive change or sur- surgical amputation of the left femoral head. There are increasing soft tissue  calcifications surrounding the proximal left femur. Um increasing erosive changes involving the central acetabulum. There is increased soft tissue density throughout the acetabulum and in this region with deep posterior decubitus ulceration. No significant joint effusion or soft tissue gas allowing for lack of intravenous contrast. Thoracic patient hardware again noted. Decubitus ulceration is seen at this level similar to the prior study. Joint effusion persists, but soft tissue air is no longer identified in the left hip. Peripherally calcified oval collection is seen abutting the tip of the coccyx measuring 3.9x2.8x4.7 cm. This has increased in size and appears more well defined. There is surrounding inflammatory stranding in  this region. Is about the underlying tip of the coccyx where there are some erosive changes similar to prior. IMPRESSION: 1. New small pericardial effusion. 2. Diffuse rectal wall thickening concerning for proctitis. 3. No acute findings. Oval peripherally calcified collection adjacent to the tip of the coccyx which has increased in size. Underlying erosive changes of the coccyx appear unchanged. Correlate clinically for abscess. 4. Interval amputation or erosive changes of the left femoral head. There are new erosive changes of the central left acetabulum concerning for osteomyelitis. Posterior left hip decubitus ulcer persists. Electronically signed by: Greig Pique MD 03/09/2024 08:12 PM EDT RP Workstation: [id number]   DG Chest 1 View Result Date: 03/09/2024 CLINICAL DATA:  [id number] SOB (shortness of breath) [id number] EXAM: CHEST  1 VIEW COMPARISON:  03/05/2024 FINDINGS: Lower lung volumes. Bilateral perihilar interstitial opacities. Hazy airspace opacities in both lung bases. Blunting of the costophrenic sulci. No pneumothorax. Mild cardiomegaly. Tortuous aorta with aortic atherosclerosis. No acute fracture or destructive lesions. Bilateral fusion rods again noted. Resection of the  proximal right clavicle. IMPRESSION: Cardiomegaly with mild pulmonary edema. Small bilateral pleural effusions. Electronically Signed   By: Rogelia Myers M.D.   On: 03/09/2024 15:10     Procedures   Medications Ordered in the ED  vancomycin  (VANCOREADY) IVPB 500 mg/100 mL (has no administration in time range)  hydrOXYzine  (ATARAX ) tablet 25 mg (has no administration in time range)  ondansetron  (ZOFRAN ) injection 4 mg (4 mg Intravenous Given 03/09/24 1450)  carvedilol  (COREG ) tablet 12.5 mg (12.5 mg Oral Given 03/09/24 1545)  amLODipine  (NORVASC ) tablet 10 mg (10 mg Oral Given 03/09/24 1545)  famotidine (PEPCID) tablet 20 mg (20 mg Oral Given 03/09/24 2032)                                    Medical Decision Making Pt presenting with nausea and upper abdominal pain, intermittently radiating into chest x 2 days, no h/o gerd,  denies sob, chest pain is reproducible suggesting msk source.  Abd pain ddx including gerd,  biliary colic, constipation, bowel obstruction,  atypical ACS.  Denies sob - doubt pneumonia or other pulmonary source.  No exam findings to suggest abd dissection,  no aneurysm per CT imaging 12/2023.   Pt was given zofran  after which nausea completely resolved,  continues to have upper abd discomfort.  CT results per below - no upper abd findings,  but concern for possible returning osteomyelitis left hip acetabulum . Pt will need MRI, in the interim, pt was covered with vancomycin  - although listed as allergy, she tolerated it well when receiving it during HD.  She states it may have made her itchy.  Hydroxyzine  added.   Dr.Zammit assuming care of pt.   Amount and/or Complexity of Data Reviewed Labs: ordered.    Details: Labs are relatively stable, she has had an elevated troponin T high-sensitivity test but flat x 2 at 86, I suspect this is patient's baseline.  Her urinalysis is cloudy, moderate leukocytes few bacteria however there are 11-20 squamous epithelial cells in  the specimen.  Urine culture has been ordered.  Lipase normal, c-Met is reassuring with normal electrolytes, normal range LFTs, biliary involvement unlikely. Radiology: ordered.    Details: Chest x-ray reviewed, cardiomegaly with mild pulmonary edema.  CT abdomen and pelvis, concern for possible return of left hip osteomyelitis.  Also concern for possible abscess at coccyx,  soft fluid collection, no  erythema or induration suggesting abscess. ECG/medicine tests: ordered.    Details: Rate 87,  incomplete RBBB, bordlerline prolonged QT, RVH.  Discussion of management or test interpretation with external provider(s): Pt discussed with Dr Charlton regarding hospitalization for obtaining MRI in am. Defers to her specialists at [facility].   Risk Decision regarding hospitalization.        Final diagnoses:  Nausea and vomiting, unspecified vomiting type  Epigastric pain  Osteomyelitis of left femur, unspecified type [facility])    ED Discharge Orders     None          Birdena Mliss RIGGERS 03/09/24 2102    Birdena Mliss, PA-C 03/09/24 2104    Suzette Pac, MD 03/12/24 [phone]

## 2024-03-09 NOTE — ED Notes (Signed)
 ED Provider at bedside.

## 2024-03-09 NOTE — ED Notes (Signed)
Pt was given a sandwich and something to drink

## 2024-03-09 NOTE — Discharge Instructions (Signed)
 Follow-up with your family doctor next week for recheck of your nausea

## 2024-03-09 NOTE — ED Notes (Signed)
 Patient transported to CT

## 2024-03-09 NOTE — ED Notes (Signed)
 Pts O2 Sats observed dropping to 88% on room air. Pt placed on 2L Nasal Cannula and O2 Sats improved to 97%.

## 2024-03-10 ENCOUNTER — Encounter (INDEPENDENT_AMBULATORY_CARE_PROVIDER_SITE_OTHER): Payer: Self-pay | Admitting: Gastroenterology

## 2024-03-10 ENCOUNTER — Emergency Department (HOSPITAL_COMMUNITY)

## 2024-03-10 DIAGNOSIS — Z7952 Long term (current) use of systemic steroids: Secondary | ICD-10-CM | POA: Diagnosis not present

## 2024-03-10 DIAGNOSIS — I132 Hypertensive heart and chronic kidney disease with heart failure and with stage 5 chronic kidney disease, or end stage renal disease: Secondary | ICD-10-CM | POA: Diagnosis present

## 2024-03-10 DIAGNOSIS — G40909 Epilepsy, unspecified, not intractable, without status epilepticus: Secondary | ICD-10-CM | POA: Diagnosis present

## 2024-03-10 DIAGNOSIS — G8220 Paraplegia, unspecified: Secondary | ICD-10-CM | POA: Diagnosis present

## 2024-03-10 DIAGNOSIS — R7881 Bacteremia: Secondary | ICD-10-CM | POA: Diagnosis not present

## 2024-03-10 DIAGNOSIS — Z992 Dependence on renal dialysis: Secondary | ICD-10-CM

## 2024-03-10 DIAGNOSIS — I5032 Chronic diastolic (congestive) heart failure: Secondary | ICD-10-CM

## 2024-03-10 DIAGNOSIS — I10 Essential (primary) hypertension: Secondary | ICD-10-CM | POA: Diagnosis not present

## 2024-03-10 DIAGNOSIS — Z881 Allergy status to other antibiotic agents status: Secondary | ICD-10-CM | POA: Diagnosis not present

## 2024-03-10 DIAGNOSIS — Z833 Family history of diabetes mellitus: Secondary | ICD-10-CM | POA: Diagnosis not present

## 2024-03-10 DIAGNOSIS — R1013 Epigastric pain: Secondary | ICD-10-CM | POA: Diagnosis present

## 2024-03-10 DIAGNOSIS — L89324 Pressure ulcer of left buttock, stage 4: Secondary | ICD-10-CM

## 2024-03-10 DIAGNOSIS — E440 Moderate protein-calorie malnutrition: Secondary | ICD-10-CM | POA: Diagnosis present

## 2024-03-10 DIAGNOSIS — L89159 Pressure ulcer of sacral region, unspecified stage: Secondary | ICD-10-CM | POA: Diagnosis present

## 2024-03-10 DIAGNOSIS — L89323 Pressure ulcer of left buttock, stage 3: Secondary | ICD-10-CM | POA: Diagnosis present

## 2024-03-10 DIAGNOSIS — M86652 Other chronic osteomyelitis, left thigh: Secondary | ICD-10-CM | POA: Diagnosis present

## 2024-03-10 DIAGNOSIS — Z79899 Other long term (current) drug therapy: Secondary | ICD-10-CM | POA: Diagnosis not present

## 2024-03-10 DIAGNOSIS — J9611 Chronic respiratory failure with hypoxia: Secondary | ICD-10-CM | POA: Diagnosis present

## 2024-03-10 DIAGNOSIS — Z8249 Family history of ischemic heart disease and other diseases of the circulatory system: Secondary | ICD-10-CM | POA: Diagnosis not present

## 2024-03-10 DIAGNOSIS — N186 End stage renal disease: Secondary | ICD-10-CM

## 2024-03-10 DIAGNOSIS — M609 Myositis, unspecified: Secondary | ICD-10-CM | POA: Diagnosis present

## 2024-03-10 DIAGNOSIS — Z9359 Other cystostomy status: Secondary | ICD-10-CM

## 2024-03-10 DIAGNOSIS — Z9981 Dependence on supplemental oxygen: Secondary | ICD-10-CM | POA: Diagnosis not present

## 2024-03-10 DIAGNOSIS — L89313 Pressure ulcer of right buttock, stage 3: Secondary | ICD-10-CM | POA: Diagnosis present

## 2024-03-10 DIAGNOSIS — D631 Anemia in chronic kidney disease: Secondary | ICD-10-CM | POA: Diagnosis present

## 2024-03-10 DIAGNOSIS — Z87820 Personal history of traumatic brain injury: Secondary | ICD-10-CM | POA: Diagnosis not present

## 2024-03-10 DIAGNOSIS — Z681 Body mass index (BMI) 19 or less, adult: Secondary | ICD-10-CM | POA: Diagnosis not present

## 2024-03-10 DIAGNOSIS — Z888 Allergy status to other drugs, medicaments and biological substances status: Secondary | ICD-10-CM | POA: Diagnosis not present

## 2024-03-10 LAB — BLOOD CULTURE ID PANEL (REFLEXED) - BCID2

## 2024-03-10 LAB — HEPATITIS B SURFACE ANTIGEN: Hepatitis B Surface Ag: NONREACTIVE

## 2024-03-10 MED ORDER — IRBESARTAN 150 MG PO TABS
150.0000 mg | ORAL_TABLET | Freq: Every evening | ORAL | Status: AC
Start: 2024-03-11 — End: ?
  Administered 2024-03-11 – 2024-03-12 (×2): 150 mg via ORAL
  Filled 2024-03-10 (×2): qty 1

## 2024-03-10 MED ORDER — HEPARIN SODIUM (PORCINE) 1000 UNIT/ML DIALYSIS: 1000.0000 [IU] | INTRAMUSCULAR | Status: DC | PRN

## 2024-03-10 MED ORDER — CARVEDILOL 12.5 MG PO TABS
25.0000 mg | ORAL_TABLET | Freq: Two times a day (BID) | ORAL | Status: DC
  Administered 2024-03-11 – 2024-03-13 (×5): 25 mg via ORAL
  Filled 2024-03-10 (×5): qty 2

## 2024-03-10 MED ORDER — LIDOCAINE-PRILOCAINE 2.5-2.5 % EX CREA: 1.0000 | TOPICAL_CREAM | CUTANEOUS | Status: DC | PRN

## 2024-03-10 MED ORDER — LEVETIRACETAM 500 MG PO TABS
1000.0000 mg | ORAL_TABLET | Freq: Every day | ORAL | Status: AC
Start: 2024-03-11 — End: ?
  Administered 2024-03-11 – 2024-03-12 (×2): 1000 mg via ORAL
  Filled 2024-03-10 (×2): qty 2

## 2024-03-10 MED ORDER — ONDANSETRON HCL 4 MG/2ML IJ SOLN
4.0000 mg | Freq: Four times a day (QID) | INTRAMUSCULAR | Status: DC | PRN
  Administered 2024-03-12: 4 mg via INTRAVENOUS

## 2024-03-10 MED ORDER — ACETAMINOPHEN 650 MG RE SUPP: 650.0000 mg | Freq: Four times a day (QID) | RECTAL | Status: DC | PRN

## 2024-03-10 MED ORDER — FLUCONAZOLE 100 MG PO TABS
200.0000 mg | ORAL_TABLET | Freq: Every day | ORAL | Status: DC
  Administered 2024-03-11 – 2024-03-12 (×2): 200 mg via ORAL
  Filled 2024-03-10 (×3): qty 2

## 2024-03-10 MED ORDER — PANTOPRAZOLE SODIUM 20 MG PO TBEC: 20.0000 mg | DELAYED_RELEASE_TABLET | Freq: Every day | ORAL | 0 refills | Status: DC

## 2024-03-10 MED ORDER — ANTICOAGULANT SODIUM CITRATE 4% (200MG/5ML) IV SOLN: 5.0000 mL | Status: DC | PRN

## 2024-03-10 MED ORDER — LIDOCAINE-PRILOCAINE 2.5-2.5 % EX CREA: TOPICAL_CREAM | Freq: Once | CUTANEOUS | Status: DC

## 2024-03-10 MED ORDER — HEPARIN SODIUM (PORCINE) 5000 UNIT/ML IJ SOLN
5000.0000 [IU] | Freq: Three times a day (TID) | INTRAMUSCULAR | Status: DC
  Administered 2024-03-10 – 2024-03-13 (×9): 5000 [IU] via SUBCUTANEOUS
  Filled 2024-03-10 (×9): qty 1

## 2024-03-10 MED ORDER — ALTEPLASE 2 MG IJ SOLR: 2.0000 mg | Freq: Once | INTRAMUSCULAR | Status: DC | PRN

## 2024-03-10 MED ORDER — LEVETIRACETAM ER 500 MG PO TB24
1000.0000 mg | ORAL_TABLET | Freq: Once | ORAL | Status: AC
  Administered 2024-03-10: 1000 mg via ORAL
  Filled 2024-03-10: qty 2

## 2024-03-10 MED ORDER — METRONIDAZOLE 500 MG/100ML IV SOLN
500.0000 mg | Freq: Two times a day (BID) | INTRAVENOUS | Status: DC
  Administered 2024-03-10 – 2024-03-13 (×6): 500 mg via INTRAVENOUS
  Filled 2024-03-10 (×6): qty 100

## 2024-03-10 MED ORDER — PENTAFLUOROPROP-TETRAFLUOROETH EX AERO: 1.0000 | INHALATION_SPRAY | CUTANEOUS | Status: DC | PRN

## 2024-03-10 MED ORDER — CHLORHEXIDINE GLUCONATE CLOTH 2 % EX PADS
6.0000 | MEDICATED_PAD | Freq: Every day | CUTANEOUS | Status: DC
  Administered 2024-03-13: 6 via TOPICAL

## 2024-03-10 MED ORDER — ONDANSETRON HCL 4 MG PO TABS: 4.0000 mg | ORAL_TABLET | Freq: Four times a day (QID) | ORAL | 0 refills | Status: DC

## 2024-03-10 MED ORDER — POLYETHYLENE GLYCOL 3350 17 G PO PACK: 17.0000 g | PACK | Freq: Every day | ORAL | Status: DC | PRN

## 2024-03-10 MED ORDER — GABAPENTIN 100 MG PO CAPS
100.0000 mg | ORAL_CAPSULE | Freq: Two times a day (BID) | ORAL | Status: DC
  Administered 2024-03-10 – 2024-03-13 (×6): 100 mg via ORAL
  Filled 2024-03-10 (×6): qty 1

## 2024-03-10 MED ORDER — PENTAFLUOROPROP-TETRAFLUOROETH EX AERO
1.0000 | INHALATION_SPRAY | CUTANEOUS | Status: DC | PRN
  Administered 2024-03-10: 1 via TOPICAL

## 2024-03-10 MED ORDER — LIDOCAINE HCL (PF) 1 % IJ SOLN: 5.0000 mL | INTRAMUSCULAR | Status: DC | PRN

## 2024-03-10 MED ORDER — CARVEDILOL 12.5 MG PO TABS
25.0000 mg | ORAL_TABLET | Freq: Two times a day (BID) | ORAL | Status: DC
  Administered 2024-03-10: 25 mg via ORAL
  Filled 2024-03-10 (×2): qty 2

## 2024-03-10 MED ORDER — SODIUM CHLORIDE 0.9 % IV SOLN
2.0000 g | INTRAVENOUS | Status: DC
  Administered 2024-03-10: 2 g via INTRAVENOUS
  Filled 2024-03-10: qty 20

## 2024-03-10 MED ORDER — AMLODIPINE BESYLATE 5 MG PO TABS
5.0000 mg | ORAL_TABLET | Freq: Every evening | ORAL | Status: DC
  Administered 2024-03-10 – 2024-03-12 (×3): 5 mg via ORAL
  Filled 2024-03-10 (×3): qty 1

## 2024-03-10 MED ORDER — ONDANSETRON HCL 4 MG PO TABS: 4.0000 mg | ORAL_TABLET | Freq: Four times a day (QID) | ORAL | Status: DC | PRN

## 2024-03-10 MED ORDER — IRBESARTAN 150 MG PO TABS
150.0000 mg | ORAL_TABLET | Freq: Every day | ORAL | Status: DC
  Administered 2024-03-10: 150 mg via ORAL
  Filled 2024-03-10: qty 1

## 2024-03-10 MED ORDER — ACETAMINOPHEN 325 MG PO TABS
650.0000 mg | ORAL_TABLET | Freq: Four times a day (QID) | ORAL | Status: DC | PRN
  Administered 2024-03-10 – 2024-03-11 (×2): 650 mg via ORAL
  Filled 2024-03-10 (×2): qty 2

## 2024-03-10 NOTE — Procedures (Signed)
 Received patient in bed to unit.  Alert and oriented.  Informed consent signed and in chart.  LUA AVF cannulated x 2 with 15 g needles per policy, without difficulty. Labs drawn, labeled and lab called to pick up. Tx initiated per MD order.   TX duration:3 hours   Tx complete. Blood returned. Needles removed, sites held x 2 until hemostasis achieved.  Patient tolerated well.  Transported back to the room  Alert, without acute distress.  Hand-off given to patient's nurse.   Access used: LUA AVF Access issues: none  Total UF removed: 2300 ml Medication(s) given: See MAR   [facility]

## 2024-03-10 NOTE — H&P (Signed)
 History and Physical    [name] FMW:[id number] DOB: 12/29/1965 DOA: 03/09/2024  PCP: Edman Meade PEDLAR, FNP   Patient coming from: Home  I have personally briefly reviewed patient's old medical records in [facility]  Chief Complaint: Vomiting, abdominal pain  HPI: [name] is a 58 y.o. female with medical history significant for ESRD on dialysis, paraplegia, chronic indwelling suprapubic catheter, seizures, diastolic CHF. Patient presented to the ED yesterday with complaints of vomiting and upper abdominal pain of 2 days duration.  Per ED provider notes, she describes stabbing upper abdominal pain that sometimes radiates into her chest.  But she tells me the pain is around her lower rib cage.  She denies any falls, no cough, no unusual physical activity. Her chronic decubitus ulcer is unchanged from baseline.  Recent prolonged hospitalization at [facility]-8/6 to 8/22-presented to [facility] ER then transferred to [facility]- for septic shock requiring pressors, left hip septic arthritis (aspiration by IR was dry )Bacteroides fragilis bacteremia and left femur osteomyelitis.  Was placed on Vanco and Zosyn , but with increasing white count was switched to metronidazole  cefepime  and vancomycin .  Repeat blood cultures were negative.  Underwent resection arthroplasty [place] by orthopedic and plastic surgery 01/09/2024.  IntraOp cultures negative.  Wound VAC was placed over left hip.  Required 1 unit of blood.  Patient was discharged on IV cefepime  and vancomycin  dosed with HD Monday Wednesday Friday.  Cultures 8/15 also grew fungus, for which she is on fluconazole  ED Course: Stable vitals.  Troponin 86 x 2.  UA with moderate leukocytes.  Chest x-ray with mild pulmonary edema and small bilateral pleural effusions. CTAP Wo C-showed new small pericardial effusion, diffuse wall thickening concerning for proctitis.  Calcified collection adjacent to tip of  coccyx increased in size, erosive changes to coccyx unchanged, correlate clinically for abscess.  Changes to left femoral head, new erosive changes to central left acetabulum concerning for cellulitis. Patient boarded in the ED till morning, and got MRI of the left hip without contrast -multiple findings mostly chronic, - chronic osteomyelitis of the left proximal femur, fluid collection lower coccyx-appear to be calcifications.  Deep decubitus ulcer below the right ischium, abnormal muscular edema in left iliac us  gluteus minimus hip abductor and upper thigh musculature suggesting myositis.  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  Septic right hip joint not totally excluded.  But less likely.  After MRI, plan was for patient to be discharged from the ED to go get dialyzed, unfortunately dialysis center [facility] could not plug patient in for today until tomorrow.  Then patient complained of difficulty breathing, hence nephrology was consulted for dialysis, to be discharged after HD.  EDP was called with positive blood cultures-negative rods.  Dr. Luiz recommended hospitalization and IV Rocephin  pending final speciation and cultures.  Review of Systems: As per HPI all other systems reviewed and negative.  Past Medical History:  Diagnosis Date   Abnormal uterine bleeding (AUB) 06/15/2014   Cancer (HCC)    uterine   ESRD on hemodialysis (HCC)    High blood pressure    Paraplegia (lower)    Seizure disorder (HCC)    Seizures (HCC)    Suprapubic catheter (HCC)    Urinary tract infection     Past Surgical History:  Procedure Laterality Date   APPLICATION OF WOUND VAC Right 09/13/2021   (  approximately 1-60mos ago) pressure sore on right hip   BACK SURGERY     Pt stated before 2000   BIOPSY  12/03/2022   Procedure: BIOPSY;  Surgeon: [name], MD;  Location: [facility];  Service: Gastroenterology;;   ESOPHAGOGASTRODUODENOSCOPY N/A 09/20/2015   Procedure: ESOPHAGOGASTRODUODENOSCOPY (EGD);  Surgeon: [name], MD;  Location: [facility];  Service: Endoscopy;  Laterality: N/A;  730   ESOPHAGOGASTRODUODENOSCOPY (EGD) WITH PROPOFOL  N/A 12/03/2022   Procedure: ESOPHAGOGASTRODUODENOSCOPY (EGD) WITH PROPOFOL ;  Surgeon: [name], MD;  Location: [facility];  Service: Gastroenterology;  Laterality: N/A;  1:15 pm, asa 3, pt knows to arrive at 10:30  dialysis pt, M,W & F   IR CATHETER TUBE CHANGE  04/02/2018   PERCUTANEOUS ENDOSCOPIC GASTROSTOMY (PEG) REMOVAL N/A 09/20/2015   Procedure: PERCUTANEOUS ENDOSCOPIC GASTROSTOMY (PEG) REMOVAL;  Surgeon: [name], MD;  Location: [facility];  Service: Endoscopy;  Laterality: N/A;   TEE WITHOUT CARDIOVERSION N/A 11/11/2022   Procedure: TRANSESOPHAGEAL ECHOCARDIOGRAM (TEE);  Surgeon: [name], MD;  Location: [facility];  Service: Cardiovascular;  Laterality: N/A;     reports that she has never smoked. She has never used smokeless tobacco. She reports current alcohol use. She reports that she does not use drugs.  Allergies  Allergen Reactions   Other Anaphylaxis    Dialyzer   Cefepime  Other (See Comments)    Severe AMS 09/2023 admission   Linezolid Other (See Comments)    Patient self-discontinued treatment due to GI intolerance. Taking it along with moxifloxacin   Moxifloxacin Other (See Comments)    Patient self-discontinued treatment due to GI intolerance. Taking it along with linezolid   Quinine Derivatives Other (See Comments)    Alters mental status   Vancomycin  Other (See Comments)    Unknown  Pt is tolerating this medication at HD   Azithromycin  Itching and Rash   Benadryl [Diphenhydramine Hcl (Sleep)] Hives   Daptomycin Hives   Piperacillin  Dermatitis   Tetracycline Itching    Able to tolerate Doxycycline .    Zosyn  [Piperacillin   Sod-Tazobactam So] Rash    Tolerated in 12/2023 admission    Family History  Problem Relation Age of Onset   Cancer Mother    Hypertension Mother    Cancer Sister        breast and then spread everywhere.   Diabetes Paternal Grandmother    Hypertension Paternal Grandmother     Prior to Admission medications   Medication Sig Start Date End Date Taking? Authorizing Provider  acetaminophen  (TYLENOL ) 500 MG tablet Take 1,000 mg by mouth every 6 (six) hours as needed for moderate pain.   Yes [provider]  albuterol  (PROVENTIL ) (2.5 MG/3ML) 0.083% nebulizer solution Take 3 mLs (2.5 mg total) by nebulization every 4 (four) hours as needed for wheezing or shortness of breath. 04/15/23 04/14/24 Yes Laquincy Eastridge, Courage, MD  amLODipine  (NORVASC ) 5 MG tablet Take 5 mg by mouth every evening. 02/09/24  Yes [provider]  B Complex-C-Folic Acid  (DIALYVITE TABLET) TABS Take 1 tablet by mouth daily. 02/27/24  Yes [provider]  carvedilol  (COREG ) 25 MG tablet Take 25 mg by mouth 2 (two) times daily. 02/03/24  Yes [provider]  cyclobenzaprine  (FLEXERIL ) 10 MG tablet Take 10 mg by mouth 3 (three) times daily as needed. 02/26/24  Yes [provider]  fluconazole (DIFLUCAN) 100 MG tablet Take 200 mg by mouth daily. 01/29/24  Yes [provider]  furosemide  (LASIX ) 80  MG tablet Take 40 mg by mouth 4 (four) times a week. 02/03/24  Yes [provider]  gabapentin  (NEURONTIN ) 100 MG capsule Take 1 capsule (100 mg total) by mouth 2 (two) times daily. 02/17/24  Yes Bacchus, Meade PEDLAR, FNP  hydrocortisone  2.5 % cream Apply 1 Application topically 2 (two) times daily. 01/29/24  Yes [provider]  irbesartan  (AVAPRO ) 150 MG tablet Take 150 mg by mouth every evening. 02/03/24  Yes [provider]  levETIRAcetam  (KEPPRA ) 1000 MG tablet Take 1,000 mg by mouth at bedtime.   Yes [provider]  lidocaine -prilocaine  (EMLA ) cream Apply 1  Application topically 3 (three) times a week. 06/04/22  Yes [provider]  ondansetron  (ZOFRAN ) 4 MG tablet Take 1 tablet (4 mg total) by mouth every 6 (six) hours. 03/10/24  Yes Idol, Julie, PA-C  ondansetron  (ZOFRAN -ODT) 4 MG disintegrating tablet 4mg  ODT q4 hours prn nausea/vomit 03/09/24  Yes Zammit, Joseph, MD  pantoprazole  (PROTONIX ) 20 MG tablet Take 1 tablet (20 mg total) by mouth daily. 03/09/24  Yes Suzette Pac, MD  senna-docusate (SENOKOT-S) 8.6-50 MG tablet Take 2 tablets by mouth at bedtime. 11/24/23  Yes Leanora Murin, Courage, MD  cefTRIAXone  2 g in sodium chloride  0.9 % 100 mL Inject 2 g into the vein daily. Patient not taking: Reported on 03/10/2024 01/01/24   TatAlm, MD  cephALEXin  (KEFLEX ) 500 MG capsule Take 500 mg by mouth daily. Patient not taking: Reported on 03/10/2024 01/29/24   [provider]  hydrOXYzine  (ATARAX ) 10 MG tablet Take 10 mg by mouth every 8 (eight) hours as needed for itching. Patient not taking: Reported on 03/10/2024 01/29/24   [provider]  predniSONE  (DELTASONE ) 20 MG tablet Take 20 mg by mouth 3 (three) times a week. Patient not taking: Reported on 03/10/2024 01/30/24   [provider]    Physical Exam: Vitals:   03/10/24 1545 03/10/24 1600 03/10/24 1615 03/10/24 1630  BP: (!) 165/85 (!) 162/92 (!) 166/83 (!) 173/83  Pulse: 87 83 86 85  Resp: (!) 22 (!) 21 (!) 21 10  Temp:      TempSrc:      SpO2: 100% 100% 99% 100%  Weight:      Height:        Constitutional: Currently receiving dialysis, chronically ill-appearing, calm, comfortable Vitals:   03/10/24 1545 03/10/24 1600 03/10/24 1615 03/10/24 1630  BP: (!) 165/85 (!) 162/92 (!) 166/83 (!) 173/83  Pulse: 87 83 86 85  Resp: (!) 22 (!) 21 (!) 21 10  Temp:      TempSrc:      SpO2: 100% 100% 99% 100%  Weight:      Height:       Eyes: PERRL, lids and conjunctivae normal ENMT: Mucous membranes are moist.   Neck: normal, supple, no masses, no  thyromegaly Respiratory: clear to auscultation bilaterally, no wheezing, no crackles. Normal respiratory effort. No accessory muscle use.  Cardiovascular: Regular rate and rhythm, no murmurs / rubs / gallops. No extremity edema.   Abdomen: Upper Abdominal rib cage pain by movement and palpation, no masses palpated. No hepatosplenomegaly. .  Suprapubic catheter Musculoskeletal: no clubbing / cyanosis. No joint deformity upper and lower extremities.  Skin: no rashes, lesions, ulcers. No induration Neurologic: No facial asymmetry, paraplegic.  Unchanged. Psychiatric: Normal judgment and insight. Alert and oriented x 3. Normal mood.   Labs on Admission: I have personally reviewed following labs and imaging studies  CBC: Recent Labs  Lab 03/05/24 1151  03/09/24 1421  WBC 6.9 7.6  NEUTROABS 4.6 5.9  HGB 9.0* 9.5*  HCT 30.1* 32.1*  MCV 97.7 97.9  PLT 221 204   Basic Metabolic Panel: Recent Labs  Lab 03/05/24 1151 03/09/24 1421  NA 139 138  K 4.8 4.3  CL 98 98  CO2 25 29  GLUCOSE 87 80  BUN 40* 20  CREATININE 5.16* 3.40*  CALCIUM  9.2 9.6   GFR: Estimated Creatinine Clearance: 13.1 mL/min (A) (by C-G formula based on SCr of 3.4 mg/dL (H)). Liver Function Tests: Recent Labs  Lab 03/05/24 1151 03/09/24 1421  AST 17 18  ALT 9 9  ALKPHOS 71 78  BILITOT 0.3 0.4  PROT 6.6 6.9  ALBUMIN  3.1* 3.3*   Recent Labs  Lab 03/05/24 1151 03/09/24 1421  LIPASE 59* 31   Urine analysis:    Component Value Date/Time   COLORURINE YELLOW 03/09/2024 1545   APPEARANCEUR CLOUDY (A) 03/09/2024 1545   LABSPEC 1.006 03/09/2024 1545   PHURINE 9.0 (H) 03/09/2024 1545   GLUCOSEU >=500 (A) 03/09/2024 1545   HGBUR SMALL (A) 03/09/2024 1545   BILIRUBINUR NEGATIVE 03/09/2024 1545   KETONESUR NEGATIVE 03/09/2024 1545   PROTEINUR >=300 (A) 03/09/2024 1545   UROBILINOGEN 0.2 03/31/2015 1200   NITRITE NEGATIVE 03/09/2024 1545   LEUKOCYTESUR MODERATE (A) 03/09/2024 1545    Radiological Exams  on Admission: MR HIP LEFT WO CONTRAST Result Date: 03/10/2024 CLINICAL DATA:  Osteomyelitis EXAM: MR OF THE LEFT HIP WITHOUT CONTRAST TECHNIQUE: Multiplanar, multisequence MR imaging was performed. No intravenous contrast was administered. COMPARISON:  CT scan 03/09/2024 FINDINGS: Bones and joints: Chronic osteomyelitis of the left proximal femur with complete bony destruction of the femoral head and intertrochanteric region. The remaining shaft of the femur is displaced about 6 cm laterally from the acetabulum and has internal marrow edema compatible with osteomyelitis, along with a posterior cutaneous and subcutaneous ulceration extending to towards and possibly into the remainder of the left hip joint. Edema and scalloping in the acetabulum likely reflecting chronic infection. The native right hip is chronically posteriorly displaced along the posterior lip of the acetabulum, with a large joint effusion, flattening and scalloping of the acetabulum, and flattening of portions of the femoral head favoring components of avascular necrosis. Septic right hip joint is not totally excluded although is less likely given the lack of surrounding marrow edema in the acetabulum and right proximal femur. Bony destructive findings of the lower coccyx, with the space occupied by a 4.2 by 5.1 by 2.4 cm collection of fluid and possibly dependent calcifications as on image 27 series 16. This collection appear to have calcifications along its margins as well especially posteriorly. Deep decubitus ulcer below the right ischium and extends to the thinned inferior pubic ramus region. Muscles and tendons Muscles and tendons: Abnormal muscular edema in the left iliacus, gluteus minimus, hip adductor, and upper thigh musculature suggesting myositis. Other findings Miscellaneous: Regional muscular atrophy. Suprapubic catheter noted Subcutaneous edema along the left perineum left upper thigh region and along the right posterolateral  buttock region. IMPRESSION: 1. Chronic osteomyelitis of the left proximal femur with complete bony destruction of the femoral head and intertrochanteric region. The remaining shaft of the femur is displaced about 6 cm laterally from the acetabulum and has internal marrow edema compatible with osteomyelitis, along with a posterior cutaneous and subcutaneous decubitus ulceration extending to towards and possibly into the remainder of the left hip joint. 2. Bony destructive findings of the lower coccyx, with the space  occupied by a 4.2 by 5.1 by 2.4 cm collection of fluid and possibly dependent calcifications. This collection appear to have calcifications along its margins as well especially posteriorly. 3. Deep decubitus ulcer below the right ischium and extends to the thinned inferior pubic ramus region. 4. Abnormal muscular edema in the left iliacus, gluteus minimus, hip adductor, and upper thigh musculature suggesting myositis. 5. 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 Septic right hip joint is not totally excluded although is less likely given the lack of surrounding marrow edema in the acetabulum and right proximal femur. Electronically Signed   By: Ryan Salvage M.D.   On: 03/10/2024 11:00   CT ABDOMEN PELVIS WO CONTRAST Result Date: 03/09/2024 EXAM: CT ABDOMEN AND PELVIS WITHOUT CONTRAST 03/09/2024 07:53:25 PM TECHNIQUE: CT of the abdomen and pelvis was performed without the administration of intravenous contrast. Multiplanar reformatted images are provided for review. Automated exposure control, iterative reconstruction, and/or weight-based adjustment of the mA/kV was utilized to reduce the radiation dose to as low as reasonably achievable. COMPARISON: CT abdomen and pelvis 12/31/2023. CLINICAL HISTORY: Abdominal pain, acute,  nonlocalized. Pt arrived via [facility] from home c/o N/V, abdominal pain X 2 days. Pt has LUE restriction for dialysis and last received dialysis yesterday. Per [facility], Pt is also hypertensive with a BP 188/95. FINDINGS: LOWER CHEST: The heart is enlarged with a new small pericardial effusion. There is a stable small right pleural effusion with atelectasis. No acute abnormality. LIVER: The liver is unremarkable. GALLBLADDER AND BILE DUCTS: Gallbladder is unremarkable. No biliary ductal dilatation. SPLEEN: No acute abnormality. PANCREAS: No acute abnormality. ADRENAL GLANDS: No acute abnormality. KIDNEYS, URETERS AND BLADDER: Bilateral renal cysts are stable. No stones in the kidneys or ureters. No hydronephrosis. No perinephric or periureteral stranding. Urinary bladder is unremarkable. GI AND BOWEL: Stomach demonstrates no acute abnormality. There is no bowel obstruction. There is diffuse rectal wall thickening, particularly posteriorly. The appendix appears normal. PERITONEUM AND RETROPERITONEUM: No ascites. No free air. VASCULATURE: Aorta is normal in caliber. LYMPH NODES: No lymphadenopathy. REPRODUCTIVE ORGANS: No acute abnormality. BONES AND SOFT TISSUES: chronic posterior right hip dislocation and joint effusion are again noted. There has been interval erosive change or sur- surgical amputation of the left femoral head. There are increasing soft tissue calcifications surrounding the proximal left femur. Um increasing erosive changes involving the central acetabulum. There is increased soft tissue density throughout the acetabulum and in this region with deep posterior decubitus ulceration. No significant joint effusion or soft tissue gas allowing for lack of intravenous contrast. Thoracic patient hardware again noted. Decubitus ulceration is seen at this level similar to the prior study. Joint effusion persists, but soft tissue air is no longer identified in the left hip. Peripherally calcified oval collection is  seen abutting the tip of the coccyx measuring 3.9x2.8x4.7 cm. This has increased in size and appears more well defined. There is surrounding inflammatory stranding in this region. Is about the underlying tip of the coccyx where there are some erosive changes similar to prior. IMPRESSION: 1. New small pericardial effusion. 2. Diffuse rectal wall thickening concerning for proctitis. 3. No acute findings. Oval peripherally calcified collection adjacent to the tip of the coccyx which has increased in size. Underlying erosive changes of the coccyx appear unchanged. Correlate clinically for abscess. 4. Interval amputation or erosive changes of the left femoral  head. There are new erosive changes of the central left acetabulum concerning for osteomyelitis. Posterior left hip decubitus ulcer persists. Electronically signed by: Greig Pique MD 03/09/2024 08:12 PM EDT RP Workstation: [id number]   DG Chest 1 View Result Date: 03/09/2024 CLINICAL DATA:  [id number] SOB (shortness of breath) [id number] EXAM: CHEST  1 VIEW COMPARISON:  03/05/2024 FINDINGS: Lower lung volumes. Bilateral perihilar interstitial opacities. Hazy airspace opacities in both lung bases. Blunting of the costophrenic sulci. No pneumothorax. Mild cardiomegaly. Tortuous aorta with aortic atherosclerosis. No acute fracture or destructive lesions. Bilateral fusion rods again noted. Resection of the proximal right clavicle. IMPRESSION: Cardiomegaly with mild pulmonary edema. Small bilateral pleural effusions. Electronically Signed   By: Rogelia Myers M.D.   On: 03/09/2024 15:10   EKG: Independently reviewed.  Sinus rhythm, rate 87, QTc 423.  No significant change from prior.  Assessment/Plan Principal Problem:   Bacteremia Active Problems:   Paraplegia (HCC)   Decubitus ulcer of buttock   ESRD on hemodialysis (HCC)   Chronic diastolic CHF (congestive heart failure) (HCC)   Suprapubic catheter (HCC)   Seizure disorder (HCC)   Essential hypertension    TBI (traumatic brain injury) (HCC)   Osteomyelitis of multiple sites-chronic    Assessment and Plan:   Bacteremia-cultures obtained yesterday growing gram-negative rods.  Awaiting speciation and sensitivities.  Recent hospitalization at [facility]- 12/2023-cultures grew Bacteroides fragilis.  Completed 6 weeks of IV antibiotics dosed with HD, completed 02/19/2024. - EDP talked to Dr. Luiz, recommended IV ceftriaxone  - Follow-up final blood culture results  Upper abdominal pain-tender to palpation worse with movement.  CTAP W OC-negative for abnormality that would explain pain.  She has multiple other findings including diffuse rectal wall thickening concerning for proctitis. -Continue IV ceftriaxone , will add metronidazole   Chronic osteomyelitis/chronic decubitus ulcer- per patient also is at baseline.  CTAP Wo C-showed new small pericardial effusion, diffuse wall thickening concerning for proctitis.  Calcified collection adjacent to tip of coccyx increased in size, erosive changes to coccyx unchanged, correlate clinically for abscess.  Changes to left femoral head, new erosive changes to central left acetabulum concerning for cellulitis. 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  Deep decubitus ulcer below the right ischium, abnormal muscular edema in left iliac us  gluteus minimus hip abductor and upper thigh musculature suggesting myositis.  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  Septic right hip joint not totally excluded.  But less likely.  ESRD/Chronic diastolic CHF-dialyzed today.  Schedule Monday Wednesday Friday.  Stable and euvolemic.  Echo 03/2023 EF 45 to 50% grade 2 DD.  Seizure disorder - On  Keppra , resume  Hypertension- elevated. - Resume Norvasc  5 mg, carvedilol  25 mg twice daily, irbesartan  150 mg daily.  DVT prophylaxis: Heparin  Code Status: Full code  family Communication: None at bedside. Disposition Plan: ~ 2 days Consults called: None Admission status: Inpatient, telemetry I certify that at the point of admission it is my clinical judgment that the patient will require inpatient hospital care spanning beyond 2 midnights from the point of admission due to high intensity of service, high risk for further deterioration and high frequency of surveillance required.   Author: Tully FORBES Carwin, MD 03/10/2024 7:31 PM  For on call review [email or web address].

## 2024-03-10 NOTE — Progress Notes (Signed)
 Date and time results received: 03/10/24 1939  Gram + cocci in clusters in 2/4 blood culture bottles resulting in Staph Epi.  Name of Provider Notified: E. Pearlean

## 2024-03-10 NOTE — ED Notes (Addendum)
 Per [facility], they are unable to do pt's dialysis Tx today. PA-C made aware. [facility] stated they could do pts dialysis Tx tomorrow at 10:30 am providing pt find her own transportation

## 2024-03-10 NOTE — ED Provider Notes (Addendum)
 Patient remains as a boarder this morning awaiting her MRI to further assess for suspected return of her osteomyelitis.  Her MRI results are reassuring as it appears she has chronic changes consistent with her osteomyelitis but there does not appear to be new or worsening disease.  Per review of chart she is still under the care of ID at [facility] was seen by them on October 1 and she remains on fluconazole as wound cultures were positive for Candida recently.  She has an appointment with them again in 2 weeks, she was encouraged to keep this appointment.  She will be discharged now so that she can proceed to dialysis.  Results for orders placed or performed during the hospital encounter of 03/09/24  CBC with Differential   Collection Time: 03/09/24  2:21 PM  Result Value Ref Range   WBC 7.6 4.0 - 10.5 K/uL   RBC 3.28 (L) 3.87 - 5.11 MIL/uL   Hemoglobin 9.5 (L) 12.0 - 15.0 g/dL   HCT 67.8 (L) 63.9 - 53.9 %   MCV 97.9 80.0 - 100.0 fL   MCH 29.0 26.0 - 34.0 pg   MCHC 29.6 (L) 30.0 - 36.0 g/dL   RDW 82.0 (H) 88.4 - 84.4 %   Platelets 204 150 - 400 K/uL   nRBC 0.0 0.0 - 0.2 %   Neutrophils Relative % 78 %   Neutro Abs 5.9 1.7 - 7.7 K/uL   Lymphocytes Relative 14 %   Lymphs Abs 1.1 0.7 - 4.0 K/uL   Monocytes Relative 5 %   Monocytes Absolute 0.4 0.1 - 1.0 K/uL   Eosinophils Relative 3 %   Eosinophils Absolute 0.2 0.0 - 0.5 K/uL   Basophils Relative 0 %   Basophils Absolute 0.0 0.0 - 0.1 K/uL   Immature Granulocytes 0 %   Abs Immature Granulocytes 0.02 0.00 - 0.07 K/uL  Comprehensive metabolic panel   Collection Time: 03/09/24  2:21 PM  Result Value Ref Range   Sodium 138 135 - 145 mmol/L   Potassium 4.3 3.5 - 5.1 mmol/L   Chloride 98 98 - 111 mmol/L   CO2 29 22 - 32 mmol/L   Glucose, Bld 80 70 - 99 mg/dL   BUN 20 6 - 20 mg/dL   Creatinine, Ser 6.59 (H) 0.44 - 1.00 mg/dL   Calcium  9.6 8.9 - 10.3 mg/dL   Total Protein 6.9 6.5 - 8.1 g/dL   Albumin  3.3 (L) 3.5 - 5.0 g/dL   AST 18 15 - 41  U/L   ALT 9 0 - 44 U/L   Alkaline Phosphatase 78 38 - 126 U/L   Total Bilirubin 0.4 0.0 - 1.2 mg/dL   GFR, Estimated 15 (L) >60 mL/min   Anion gap 10 5 - 15  Lipase, blood   Collection Time: 03/09/24  2:21 PM  Result Value Ref Range   Lipase 31 11 - 51 U/L  Troponin T, High Sensitivity   Collection Time: 03/09/24  2:21 PM  Result Value Ref Range   Troponin T High Sensitivity 86 (H) 0 - 19 ng/L  Urinalysis, Routine w reflex microscopic -Urine, Suprapubic   Collection Time: 03/09/24  3:45 PM  Result Value Ref Range   Color, Urine YELLOW YELLOW   APPearance CLOUDY (A) CLEAR   Specific Gravity, Urine 1.006 1.005 - 1.030   pH 9.0 (H) 5.0 - 8.0   Glucose, UA >=500 (A) NEGATIVE mg/dL   Hgb urine dipstick SMALL (A) NEGATIVE   Bilirubin Urine NEGATIVE NEGATIVE  Ketones, ur NEGATIVE NEGATIVE mg/dL   Protein, ur >=699 (A) NEGATIVE mg/dL   Nitrite NEGATIVE NEGATIVE   Leukocytes,Ua MODERATE (A) NEGATIVE   RBC / HPF 6-10 0 - 5 RBC/hpf   WBC, UA >50 0 - 5 WBC/hpf   Bacteria, UA FEW (A) NONE SEEN   Squamous Epithelial / HPF 11-20 0 - 5 /HPF   WBC Clumps PRESENT    Mucus PRESENT    Amorphous Crystal PRESENT    Triple Phosphate Crystal PRESENT    Non Squamous Epithelial 0-5 (A) NONE SEEN  Troponin T, High Sensitivity   Collection Time: 03/09/24  4:56 PM  Result Value Ref Range   Troponin T High Sensitivity 86 (H) 0 - 19 ng/L  Blood culture (routine x 2)   Collection Time: 03/09/24  8:35 PM   Specimen: BLOOD  Result Value Ref Range   Specimen Description BLOOD RIGHT ANTECUBITAL    Special Requests      BOTTLES DRAWN AEROBIC AND ANAEROBIC Blood Culture adequate volume   Culture      NO GROWTH < 12 HOURS Performed at [facility], [address], [place], [place] [address]    Report Status PENDING   Blood culture (routine x 2)   Collection Time: 03/09/24  9:02 PM   Specimen: BLOOD  Result Value Ref Range   Specimen Description BLOOD BLOOD RIGHT HAND    Special Requests       BOTTLES DRAWN AEROBIC AND ANAEROBIC Blood Culture adequate volume   Culture      NO GROWTH < 12 HOURS Performed at [facility], [address] [place], [place] [address]    Report Status PENDING    MR HIP LEFT WO CONTRAST Result Date: 03/10/2024 CLINICAL DATA:  Osteomyelitis EXAM: MR OF THE LEFT HIP WITHOUT CONTRAST TECHNIQUE: Multiplanar, multisequence MR imaging was performed. No intravenous contrast was administered. COMPARISON:  CT scan 03/09/2024 FINDINGS: Bones and joints: Chronic osteomyelitis of the left proximal femur with complete bony destruction of the femoral head and intertrochanteric region. The remaining shaft of the femur is displaced about 6 cm laterally from the acetabulum and has internal marrow edema compatible with osteomyelitis, along with a posterior cutaneous and subcutaneous ulceration extending to towards and possibly into the remainder of the left hip joint. Edema and scalloping in the acetabulum likely reflecting chronic infection. The native right hip is chronically posteriorly displaced along the posterior lip of the acetabulum, with a large joint effusion, flattening and scalloping of the acetabulum, and flattening of portions of the femoral head favoring components of avascular necrosis. Septic right hip joint is not totally excluded although is less likely given the lack of surrounding marrow edema in the acetabulum and right proximal femur. Bony destructive findings of the lower coccyx, with the space occupied by a 4.2 by 5.1 by 2.4 cm collection of fluid and possibly dependent calcifications as on image 27 series 16. This collection appear to have calcifications along its margins as well especially posteriorly. Deep decubitus ulcer below the right ischium and extends to the thinned inferior pubic ramus region. Muscles and tendons Muscles and tendons: Abnormal muscular edema in the left iliacus, gluteus minimus, hip adductor, and upper thigh musculature suggesting  myositis. Other findings Miscellaneous: Regional muscular atrophy. Suprapubic catheter noted Subcutaneous edema along the left perineum left upper thigh region and along the right posterolateral buttock region. IMPRESSION: 1. Chronic osteomyelitis of the left proximal femur with complete bony destruction of the femoral head and intertrochanteric region. The remaining  shaft of the femur is displaced about 6 cm laterally from the acetabulum and has internal marrow edema compatible with osteomyelitis, along with a posterior cutaneous and subcutaneous decubitus ulceration extending to towards and possibly into the remainder of the left hip joint. 2. Bony destructive findings of the lower coccyx, with the space occupied by a 4.2 by 5.1 by 2.4 cm collection of fluid and possibly dependent calcifications. This collection appear to have calcifications along its margins as well especially posteriorly. 3. Deep decubitus ulcer below the right ischium and extends to the thinned inferior pubic ramus region. 4. Abnormal muscular edema in the left iliacus, gluteus minimus, hip adductor, and upper thigh musculature suggesting myositis. 5. 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 Septic right hip joint is not totally excluded although is less likely given the lack of surrounding marrow edema in the acetabulum and right proximal femur. Electronically Signed   By: Ryan Salvage M.D.   On: 03/10/2024 11:00   CT ABDOMEN PELVIS WO CONTRAST Result Date: 03/09/2024 EXAM: CT ABDOMEN AND PELVIS WITHOUT CONTRAST 03/09/2024 07:53:25 PM TECHNIQUE: CT of the abdomen and pelvis was performed without the administration of intravenous contrast. Multiplanar reformatted images are provided for review. Automated exposure control, iterative reconstruction, and/or  weight-based adjustment of the mA/kV was utilized to reduce the radiation dose to as low as reasonably achievable. COMPARISON: CT abdomen and pelvis 12/31/2023. CLINICAL HISTORY: Abdominal pain, acute, nonlocalized. Pt arrived via [facility] from home c/o N/V, abdominal pain X 2 days. Pt has LUE restriction for dialysis and last received dialysis yesterday. Per [facility], Pt is also hypertensive with a BP 188/95. FINDINGS: LOWER CHEST: The heart is enlarged with a new small pericardial effusion. There is a stable small right pleural effusion with atelectasis. No acute abnormality. LIVER: The liver is unremarkable. GALLBLADDER AND BILE DUCTS: Gallbladder is unremarkable. No biliary ductal dilatation. SPLEEN: No acute abnormality. PANCREAS: No acute abnormality. ADRENAL GLANDS: No acute abnormality. KIDNEYS, URETERS AND BLADDER: Bilateral renal cysts are stable. No stones in the kidneys or ureters. No hydronephrosis. No perinephric or periureteral stranding. Urinary bladder is unremarkable. GI AND BOWEL: Stomach demonstrates no acute abnormality. There is no bowel obstruction. There is diffuse rectal wall thickening, particularly posteriorly. The appendix appears normal. PERITONEUM AND RETROPERITONEUM: No ascites. No free air. VASCULATURE: Aorta is normal in caliber. LYMPH NODES: No lymphadenopathy. REPRODUCTIVE ORGANS: No acute abnormality. BONES AND SOFT TISSUES: chronic posterior right hip dislocation and joint effusion are again noted. There has been interval erosive change or sur- surgical amputation of the left femoral head. There are increasing soft tissue calcifications surrounding the proximal left femur. Um increasing erosive changes involving the central acetabulum. There is increased soft tissue density throughout the acetabulum and in this region with deep posterior decubitus ulceration. No significant joint effusion or soft tissue gas allowing for lack of intravenous contrast. Thoracic patient hardware again noted.  Decubitus ulceration is seen at this level similar to the prior study. Joint effusion persists, but soft tissue air is no longer identified in the left hip. Peripherally calcified oval collection is seen abutting the tip of the coccyx measuring 3.9x2.8x4.7 cm. This has increased in size and appears more well defined. There is surrounding inflammatory stranding in this region. Is about the underlying tip of the coccyx where there are some erosive changes similar to prior. IMPRESSION: 1.  New small pericardial effusion. 2. Diffuse rectal wall thickening concerning for proctitis. 3. No acute findings. Oval peripherally calcified collection adjacent to the tip of the coccyx which has increased in size. Underlying erosive changes of the coccyx appear unchanged. Correlate clinically for abscess. 4. Interval amputation or erosive changes of the left femoral head. There are new erosive changes of the central left acetabulum concerning for osteomyelitis. Posterior left hip decubitus ulcer persists. Electronically signed by: Greig Pique MD 03/09/2024 08:12 PM EDT RP Workstation: [id number]   DG Chest 1 View Result Date: 03/09/2024 CLINICAL DATA:  [id number] SOB (shortness of breath) [id number] EXAM: CHEST  1 VIEW COMPARISON:  03/05/2024 FINDINGS: Lower lung volumes. Bilateral perihilar interstitial opacities. Hazy airspace opacities in both lung bases. Blunting of the costophrenic sulci. No pneumothorax. Mild cardiomegaly. Tortuous aorta with aortic atherosclerosis. No acute fracture or destructive lesions. Bilateral fusion rods again noted. Resection of the proximal right clavicle. IMPRESSION: Cardiomegaly with mild pulmonary edema. Small bilateral pleural effusions. Electronically Signed   By: Rogelia Myers M.D.   On: 03/09/2024 15:10   DG Chest Portable 1 View Result Date: 03/05/2024 CLINICAL DATA:  Headache. EXAM: PORTABLE CHEST 1 VIEW COMPARISON:  December 31, 2023 FINDINGS: The cardiac silhouette is enlarged and  unchanged in size. Mild atelectatic changes are seen within the right lung base. There is a small, stable right pleural effusion versus pleural thickening. No pneumothorax is identified. A chronic deformity of the right clavicle is present. Postoperative changes are seen throughout the thoracic spine. IMPRESSION: 1. Mild right basilar atelectasis. 2. Small, stable right pleural effusion versus pleural thickening. Electronically Signed   By: Suzen Dials M.D.   On: 03/05/2024 12:22      Cyndee Giammarco, PA-C 03/10/24 1151     Prior to discharge, we tried to arrange dialysis for patient today, [facility] cannot accommodate her but can dialyze her tomorrow at 10 AM.  Unfortunately that transportation bus requires a 24-hour notice so patient is concerned she will not be able to get transportation to get to her dialysis tomorrow.  She also expresses she is having increasing shortness of breath today.  Reexamine of her lungs she does have few rales bilateral bases.  X-ray yesterday did show some mild atelectasis/right small pleural effusion her labs were stable with a normal potassium level.  However clinically she would probably be better served by being dialyzed today rather than waiting till tomorrow and then maybe not been able to get transportation.  I spoke with Dr. Jerrye with nephrology who will place orders and arrange dialysis here after which she can dispo to home.   2:50 PM  Pt now in dialysis.  Call from the lab stating one of her blood cultures is now positive for gram - rods.    Discussed with Dr. Luiz who recommends rocephin  2 grams, admit.  She will follow pt.    4:36 PM Pt discussed with Dr. Pearlean who accepts pt for admission.   Birdena Clarity, PA-C 03/10/24 1637    Kammerer, Megan L, DO 03/14/24 [phone]

## 2024-03-11 DIAGNOSIS — R7881 Bacteremia: Secondary | ICD-10-CM | POA: Diagnosis not present

## 2024-03-11 LAB — HEPATITIS B SURFACE ANTIBODY, QUANTITATIVE: Hep B S AB Quant (Post): 660 m[IU]/mL

## 2024-03-11 LAB — CBC
HCT: 30.2 % — ABNORMAL LOW (ref 36.0–46.0)
Hemoglobin: 8.9 g/dL — ABNORMAL LOW (ref 12.0–15.0)
MCH: 28.3 pg (ref 26.0–34.0)
MCHC: 29.5 g/dL — ABNORMAL LOW (ref 30.0–36.0)
MCV: 96.2 fL (ref 80.0–100.0)
Platelets: 184 K/uL (ref 150–400)
RBC: 3.14 MIL/uL — ABNORMAL LOW (ref 3.87–5.11)
RDW: 17.3 % — ABNORMAL HIGH (ref 11.5–15.5)
WBC: 6.9 K/uL (ref 4.0–10.5)
nRBC: 0 % (ref 0.0–0.2)

## 2024-03-11 LAB — URINE CULTURE: Culture: >=100000 — AB

## 2024-03-11 LAB — BASIC METABOLIC PANEL WITH GFR
Anion gap: 12 (ref 5–15)
BUN: 19 mg/dL (ref 6–20)
CO2: 29 mmol/L (ref 22–32)
Calcium: 9.2 mg/dL (ref 8.9–10.3)
Chloride: 98 mmol/L (ref 98–111)
Creatinine, Ser: 3.17 mg/dL — ABNORMAL HIGH (ref 0.44–1.00)
GFR, Estimated: 16 mL/min — ABNORMAL LOW (ref >60)
Glucose, Bld: 88 mg/dL (ref 70–99)
Potassium: 3.6 mmol/L (ref 3.5–5.1)
Sodium: 138 mmol/L (ref 135–145)

## 2024-03-11 MED ORDER — IPRATROPIUM-ALBUTEROL 0.5-2.5 (3) MG/3ML IN SOLN: 3.0000 mL | RESPIRATORY_TRACT | Status: DC | PRN

## 2024-03-11 MED ORDER — SODIUM CHLORIDE 0.9 % IV SOLN
2.0000 g | INTRAVENOUS | Status: DC
  Administered 2024-03-11 – 2024-03-13 (×3): 2 g via INTRAVENOUS
  Filled 2024-03-11 (×3): qty 20

## 2024-03-11 MED ORDER — METOPROLOL TARTRATE 5 MG/5ML IV SOLN: 5.0000 mg | INTRAVENOUS | Status: DC | PRN

## 2024-03-11 MED ORDER — CINACALCET HCL 30 MG PO TABS
30.0000 mg | ORAL_TABLET | ORAL | Status: DC
  Administered 2024-03-12: 30 mg via ORAL
  Filled 2024-03-11: qty 1

## 2024-03-11 MED ORDER — GLUCAGON HCL RDNA (DIAGNOSTIC) 1 MG IJ SOLR: 1.0000 mg | INTRAMUSCULAR | Status: DC | PRN

## 2024-03-11 MED ORDER — CALCITRIOL 0.25 MCG PO CAPS
2.0000 ug | ORAL_CAPSULE | ORAL | Status: DC
  Administered 2024-03-12: 2 ug via ORAL
  Filled 2024-03-11: qty 8

## 2024-03-11 MED ORDER — DARBEPOETIN ALFA 200 MCG/0.4ML IJ SOSY: 200.0000 ug | PREFILLED_SYRINGE | INTRAMUSCULAR | Status: DC

## 2024-03-11 MED ORDER — HYDRALAZINE HCL 20 MG/ML IJ SOLN: 10.0000 mg | INTRAMUSCULAR | Status: DC | PRN

## 2024-03-11 MED ORDER — CHLORHEXIDINE GLUCONATE CLOTH 2 % EX PADS
6.0000 | MEDICATED_PAD | Freq: Every day | CUTANEOUS | Status: DC
  Administered 2024-03-12 – 2024-03-13 (×2): 6 via TOPICAL

## 2024-03-11 NOTE — Hospital Course (Addendum)
 Brief Narrative:   58 year old with history of ESRD on HD, paraplegia, chronic indwelling suprapubic catheter, seizure, diastolic CHF comes to the ED with complaints of nausea and abdominal pain for the past 2 days.  Had a recent prolonged hospitalization at [facility] from 8/6 - 8/22 for septic shock secondary to left hip septic arthritis/osteomyelitis requiring resection arthroplasty [place] by orthopedic and plastic surgery with wound VAC placement and prolonged course of IV antibiotics with hemodialysis. During this hospitalization CT chest abdomen pelvis showed new small pericardial effusion, diffuse wall thickening/proctitis, coccyx changes with concerns of possible abscess, new erosive changes to central left acetabulum concerning for cellulitis.  MRI of the left hip eventually showed chronic changes.  Eventually patient discharged from ER to hemodialysis with reported of some difficulty breathing which improved with dialysis.  Eventually called back by EDP for positive blood cultures.  ID recommended IV Rocephin .  Assessment & Plan:    Bacteremia, gram positive rod - Initial cultures thought to be gram-negative rods but later this was updated by micro being G[facility].  Confirm with ID and micro team.  This appears to be contaminant, will repeat cultures this morning and discontinue antibiotics.  If repeat cultures are negative by 10/18 she can likely go home.  Given her history, best to make sure cultures truly are negative. -At [facility] in 12/2023 grew bacteroides fragilis, completed 6 weeks of IV antibiotics on 9/25.    Upper abdominal pain -CT scan is suggestive of proctitis otherwise overall unremarkable.  LFTs and lipase are normal.    Chronic osteomyelitis/chronic decubitus ulcer-  per patient also is at baseline.  CTAP Wo C-showed new small pericardial effusion, diffuse wall thickening concerning for proctitis.  Calcified collection adjacent to tip of coccyx increased in size, erosive  changes to coccyx unchanged, correlate clinically for abscess.  Changes to left femoral head, new erosive changes to central left acetabulum concerning for cellulitis. 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  Deep decubitus ulcer below the right ischium, abnormal muscular edema in left iliac us  gluteus minimus hip abductor and upper thigh musculature suggesting myositis.  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  Septic right hip joint not totally excluded.  But less likely.   ESRD/Chronic diastolic CHF Status post HD 10/15, nephrology following to optimize for further volume status and HD needs..  Schedule Monday Wednesday Friday.  Stable and euvolemic.  Echo 03/2023 EF 45 to 50% grade 2 DD.   Seizure disorder - On Keppra , resume   Hypertension- elevated. - Resume Norvasc  5 mg, carvedilol  25 mg twice daily, irbesartan  150 mg daily. IV prn   DVT prophylaxis: Heparin  Code Status: Full code  family Communication: None at bedside. Disposition Plan: ~ 2 days if cultures remain negative   PT Follow up Recs:   Subjective:  Patient is alert awake, answers all the basic questions no other complaints at this time.  Overall feels well. She understands that her cultures might have been a contaminant and is okay for us  monitoring her  Examination:  General exam: Appears calm and comfortable  Respiratory system: Clear to auscultation. Respiratory effort normal. Cardiovascular system: S1 & S2 heard, RRR. No JVD, murmurs, rubs, gallops or clicks. No pedal edema. Gastrointestinal system: Abdomen is nondistended, soft and nontender. No  organomegaly or masses felt. Normal bowel sounds heard. Central nervous system:  Alert and oriented. No focal neurological deficits. Extremities: Symmetric 5 x 5 power. Skin: No rashes, lesions or ulcers Psychiatry: Judgement and insight appear normal. Mood & affect appropriate.

## 2024-03-11 NOTE — Consult Note (Signed)
 [facility] Renal Consultation Note  Requesting MD: Burgess Dare, MD Indication for Consultation:  ESRD  Chief complaint: nausea and vomiting   HPI:  [name] is a 58 y.o. female with a history of end-stage renal disease on hemodialysis Monday Wednesday Friday at [facility], paraplegia, chronic indwelling suprapubic catheter and chronic diastolic CHF who presented to the hospital with nausea with vomiting and abdominal pain.  Thankfully, her nausea and vomiting is better today.  She has a history of recent osteomyelitis of the left hip.  She was in the ER overnight 10/14-10/15 awaiting an MRI which later demonstrated chronic osteomyelitis, myositis, fluid collection near the lower coccyx, a deep decubitus ulcer, and right hip joint effusion.  She also developed shortness of breath.  We did dialysis here on 10/15 with 2.3 kg UF.  She states that her shortness of breath has improved.  She states that she has been told to wear oxygen all the time and often just does this at night.  Nephrology is consulted for assistance with management of ESRD.  Note one set of blood cultures from 10/14 are now positive for staph epidermidis.  The other set from 03/09/24 is NGTD.  Repeat blood cultures are ordered.  She was recently on outpatient abx - cephalexin  500 mg daily which she is still taking.  She was previously on vanomycin and cefepime  with outpatient HD - the HD RN was unable to tell me the last date given.  Per discussion with primary team many MRI findings were felt chronic.  Per charting, she completed 6 weeks of IV abx on 02/19/24.     PMHx:   Past Medical History:  Diagnosis Date   Abnormal uterine bleeding (AUB) 06/15/2014   Cancer (HCC)    uterine   ESRD on hemodialysis (HCC)    High blood pressure    Paraplegia (lower)    Seizure disorder (HCC)    Seizures (HCC)    Suprapubic catheter (HCC)    Urinary tract infection     Past Surgical History:  Procedure  Laterality Date   APPLICATION OF WOUND VAC Right 09/13/2021   (approximately 1-69mos ago) pressure sore on right hip   BACK SURGERY     Pt stated before 2000   BIOPSY  12/03/2022   Procedure: BIOPSY;  Surgeon: [name], MD;  [facility]: [facility];  Service: Gastroenterology;;   ESOPHAGOGASTRODUODENOSCOPY N/A 09/20/2015   Procedure: ESOPHAGOGASTRODUODENOSCOPY (EGD);  Surgeon: [name], MD;  Location: [facility];  Service: Endoscopy;  Laterality: N/A;  730   ESOPHAGOGASTRODUODENOSCOPY (EGD) WITH PROPOFOL  N/A 12/03/2022   Procedure: ESOPHAGOGASTRODUODENOSCOPY (EGD) WITH PROPOFOL ;  Surgeon: [name], MD;  Location: [facility];  Service: Gastroenterology;  Laterality: N/A;  1:15 pm, asa 3, pt knows to arrive at 10:30  dialysis pt, M,W & F   IR CATHETER TUBE CHANGE  04/02/2018   PERCUTANEOUS ENDOSCOPIC GASTROSTOMY (PEG) REMOVAL N/A 09/20/2015   Procedure: PERCUTANEOUS ENDOSCOPIC GASTROSTOMY (PEG) REMOVAL;  Surgeon: [name], MD;  Location: [facility];  Service: Endoscopy;  Laterality: N/A;   TEE WITHOUT CARDIOVERSION N/A 11/11/2022   Procedure: TRANSESOPHAGEAL ECHOCARDIOGRAM (TEE);  Surgeon: [name], MD;  Location: [facility];  Service: Cardiovascular;  Laterality: N/A;    Family Hx:  Family History  Problem Relation Age of Onset   Cancer Mother    Hypertension Mother    Cancer Sister        breast and then spread everywhere.  Diabetes Paternal Grandmother    Hypertension Paternal Grandmother     Social History:  reports that she has never smoked. She has never used smokeless tobacco. She reports current alcohol use. She reports that she does not use drugs.  Allergies:  Allergies  Allergen Reactions   Other Anaphylaxis    Dialyzer   Cefepime  Other (See Comments)    Severe AMS 09/2023 admission   Linezolid Other (See Comments)    Patient self-discontinued treatment due to GI intolerance. Taking it along with moxifloxacin    Moxifloxacin Other (See Comments)    Patient self-discontinued treatment due to GI intolerance. Taking it along with linezolid   Quinine Derivatives Other (See Comments)    Alters mental status   Vancomycin  Other (See Comments)    Unknown  Pt is tolerating this medication at HD   Azithromycin  Itching and Rash   Benadryl [Diphenhydramine Hcl (Sleep)] Hives   Daptomycin Hives   Piperacillin  Dermatitis   Tetracycline Itching    Able to tolerate Doxycycline .    Zosyn  [Piperacillin  Sod-Tazobactam So] Rash    Tolerated in 12/2023 admission    Medications: Prior to Admission medications   Medication Sig Start Date End Date Taking? Authorizing Provider  acetaminophen  (TYLENOL ) 500 MG tablet Take 1,000 mg by mouth every 6 (six) hours as needed for moderate pain.   Yes [provider]  albuterol  (PROVENTIL ) (2.5 MG/3ML) 0.083% nebulizer solution Take 3 mLs (2.5 mg total) by nebulization every 4 (four) hours as needed for wheezing or shortness of breath. 04/15/23 04/14/24 Yes Emokpae, Courage, MD  amLODipine  (NORVASC ) 5 MG tablet Take 5 mg by mouth every evening. 02/09/24  Yes [provider]  B Complex-C-Folic Acid  (DIALYVITE TABLET) TABS Take 1 tablet by mouth daily. 02/27/24  Yes [provider]  carvedilol  (COREG ) 25 MG tablet Take 25 mg by mouth 2 (two) times daily. 02/03/24  Yes [provider]  cyclobenzaprine  (FLEXERIL ) 10 MG tablet Take 10 mg by mouth 3 (three) times daily as needed. 02/26/24  Yes [provider]  fluconazole (DIFLUCAN) 100 MG tablet Take 200 mg by mouth daily. 01/29/24  Yes [provider]  furosemide  (LASIX ) 80 MG tablet Take 40 mg by mouth 4 (four) times a week. 02/03/24  Yes [provider]  gabapentin  (NEURONTIN ) 100 MG capsule Take 1 capsule (100 mg total) by mouth 2 (two) times daily. 02/17/24  Yes Bacchus, Meade PEDLAR, FNP  hydrocortisone  2.5 % cream Apply 1 Application topically 2 (two) times daily. 01/29/24  Yes  [provider]  irbesartan  (AVAPRO ) 150 MG tablet Take 150 mg by mouth every evening. 02/03/24  Yes [provider]  levETIRAcetam  (KEPPRA ) 1000 MG tablet Take 1,000 mg by mouth at bedtime.   Yes [provider]  lidocaine -prilocaine  (EMLA ) cream Apply 1 Application topically 3 (three) times a week. 06/04/22  Yes [provider]  ondansetron  (ZOFRAN ) 4 MG tablet Take 1 tablet (4 mg total) by mouth every 6 (six) hours. 03/10/24  Yes Idol, Julie, PA-C  ondansetron  (ZOFRAN -ODT) 4 MG disintegrating tablet 4mg  ODT q4 hours prn nausea/vomit 03/09/24  Yes Zammit, Joseph, MD  pantoprazole  (PROTONIX ) 20 MG tablet Take 1 tablet (20 mg total) by mouth daily. 03/09/24  Yes Suzette Pac, MD  senna-docusate (SENOKOT-S) 8.6-50 MG tablet Take 2 tablets by mouth at bedtime. 11/24/23  Yes Emokpae, Courage, MD  cefTRIAXone  2 g in sodium chloride  0.9 % 100 mL Inject 2 g into the vein daily. Patient not taking: Reported on 03/10/2024  01/01/24   Evonnie Lenis, MD  cephALEXin  (KEFLEX ) 500 MG capsule Take 500 mg by mouth daily. Patient not taking: Reported on 03/10/2024 01/29/24   [provider]  hydrOXYzine  (ATARAX ) 10 MG tablet Take 10 mg by mouth every 8 (eight) hours as needed for itching. Patient not taking: Reported on 03/10/2024 01/29/24   [provider]  predniSONE  (DELTASONE ) 20 MG tablet Take 20 mg by mouth 3 (three) times a week. Patient not taking: Reported on 03/10/2024 01/30/24   [provider]    I have reviewed the patient's current and reported prior to admission medications.   Labs:     Latest Ref Rng & Units 03/11/2024    5:08 AM 03/09/2024    2:21 PM 03/05/2024   11:51 AM  BMP  Glucose 70 - 99 mg/dL 88  80  87   BUN 6 - 20 mg/dL 19  20  40   Creatinine 0.44 - 1.00 mg/dL 6.82  6.59  4.83   Sodium 135 - 145 mmol/L 138  138  139   Potassium 3.5 - 5.1 mmol/L 3.6  4.3  4.8   Chloride 98 - 111 mmol/L 98  98  98   CO2 22 - 32 mmol/L 29  29   25    Calcium  8.9 - 10.3 mg/dL 9.2  9.6  9.2     Urinalysis    Component Value Date/Time   COLORURINE YELLOW 03/09/2024 1545   APPEARANCEUR CLOUDY (A) 03/09/2024 1545   LABSPEC 1.006 03/09/2024 1545   PHURINE 9.0 (H) 03/09/2024 1545   GLUCOSEU >=500 (A) 03/09/2024 1545   HGBUR SMALL (A) 03/09/2024 1545   BILIRUBINUR NEGATIVE 03/09/2024 1545   KETONESUR NEGATIVE 03/09/2024 1545   PROTEINUR >=300 (A) 03/09/2024 1545   UROBILINOGEN 0.2 03/31/2015 1200   NITRITE NEGATIVE 03/09/2024 1545   LEUKOCYTESUR MODERATE (A) 03/09/2024 1545     ROS:  Pertinent items noted in HPI and remainder of comprehensive ROS otherwise negative.   Physical Exam: Vitals:   03/11/24 0231 03/11/24 0601  BP: (!) 156/76 (!) 170/82  Pulse: 86 84  Resp: 16 16  Temp: 98.9 F (37.2 C) 98.8 F (37.1 C)  SpO2: 96% 97%     General:  adult female in bed in NAD  HEENT: NCAT Eyes: EOMI sclera anicteric Neck: supple trachea midline Heart: S1S2 no rub Lungs: clear to auscultation normal work of breathing on 2 liters  Abdomen: soft/nt/nd Extremities: no edema appreciated; no cyanosis; helped her to put a pillow between her knees for comfort and skin protection Neuro: alert and oriented x 3 provides hx and follows commands; paraplegic GU indwelling suprapubic catheter Psych: no anxiety or agitation  Access LUE AVF with bruit and thrill    Outpatient HD orders:  Monday Wednesday Friday at [facility] 3 hrs 45 minutes  EDW 46.5 kg  Left AVF  15 g Standard dialyzer BF 400 ml/hr DF 500 ml/hr 2K /2.5 calcium   Last post weight: 51 kg from 03/08/24  Meds: calcitriol  2 mcg MWF,  Sensipar  30 mg MWF with HD and mircera 200 mcg was given on last Monday 03/01/24    Assessment/Plan:   # ESRD  - HD per MWF schedule at [facility] - Plan for HD on 10/16 per her standard schedule  - Would ensure that fluconazole is renally dosed for the indication   # Chronic osteomyelitis - see MRI results   - abx per primary team discretion    # HTN  - optimize  volume status with HD  - continue other current regimen  # Anemia of CKD  - Recent ESA administration - due again on 10/20 if still here   # Chronic diastolic CHF - optimize volume status with HD  # GPC Bacteremia  - Repeat blood cultures from 03/11/24 are ordered  - note one set of cultures from 10/14 is NGTD suggestive of possible contaminant   # Metabolic bone disease - resume sensipar  and calcitriol  and check phos in AM.  No binders on home med list  Disposition - per primary team    Katheryn JAYSON Saba 03/11/2024,

## 2024-03-11 NOTE — TOC Initial Note (Signed)
 Transition of Care [facility]) - Initial/Assessment Note    Patient Details  Name: [name] MRN: [id number] Date of Birth: 13-Sep-1965  [facility] CM/SW Contact:    Hoy DELENA Bigness, LCSW Phone Number: 03/11/2024, 10:36 AM  Clinical Narrative:                 Pt at high risk for readmission. Pt is paraplegia since 2000 with multiple comorbidities. Pt lives at home with her daughter who is her primary caretaker. Pt has a hospital bed, hoyer lift, wheelchair at home managed by [employer]. Pt is on 2L of O2 at home provided by [facility]. Pt receives [facility] for wound care 3x week with [facility] and will need roc orders placed prior to discharge. Pt receives HD M-W-F at [facility] in [place]. Pt obtains transportation to appointments with [facility] and [facility].   Pt shares her hospital bed and mattress are not providing support for her and that the mattress is very lumpy. Pt shares she has had the bed/mattress for years and would like to see about getting a new one.  CSW reached out to [place] who share that pt received a new bed/mattress in June of this year and will not qualify for a new one through insurance until 10/2024.  Expected Discharge Plan: Home w Home Health Services Barriers to Discharge: Continued Medical Work up   Patient Goals and CMS Choice Patient states their goals for this hospitalization and ongoing recovery are:: To return home CMS Medicare.gov Compare Post Acute Care list provided to:: Patient Choice offered to / list presented to : Patient [place] ownership interest in [facility].provided to::  (NA)    Expected Discharge Plan and Services In-house Referral: Clinical Social Work Discharge Planning Services: NA Post Acute Care Choice: Resumption of Svcs/PTA Provider, Home Health Living arrangements for the past 2 months: Single Family Home                 DME Arranged: N/A DME Agency: NA                   Prior Living Arrangements/Services Living arrangements for the past 2 months: Single Family Home Lives with:: Adult Children Patient language and need for interpreter reviewed:: Yes Do you feel safe going back to the place where you live?: Yes      Need for Family Participation in Patient Care: Yes (Comment) Care giver support system in place?: Yes (comment) Current home services: DME, Home RN (hospital bed, hoyer lift, wheelchair, O2 w/ [employer]. HHRN w/ [facility]) Criminal Activity/Legal Involvement Pertinent to Current Situation/Hospitalization: No - Comment as needed  Activities of Daily Living   ADL Screening (condition at time of admission) Is the patient deaf or have difficulty hearing?: No Does the patient have difficulty seeing, even when wearing glasses/contacts?: No Does the patient have difficulty concentrating, remembering, or making decisions?: No  Permission Sought/Granted Permission sought to share information with : Oceanographer granted to share information with : Yes, Verbal Permission Granted     Permission granted to share info w AGENCY: [employer], [facility], and [employer]        Emotional Assessment Appearance:: Appears stated age Attitude/Demeanor/Rapport: Engaged Affect (typically observed): Accepting Orientation: : Oriented to Self, Oriented to Place, Oriented to  Time, Oriented to Situation Alcohol / Substance Use: Not Applicable Psych Involvement: No (comment)  Admission diagnosis:  Epigastric pain [R10.13] Other chronic osteomyelitis of left femur (HCC) [M86.652] Nausea  and vomiting, unspecified vomiting type [R11.2] Bacteremia [R78.81] Patient Active Problem List   Diagnosis Date Noted   SIRS (systemic inflammatory response syndrome) (HCC) 12/31/2023   Pyogenic arthritis of left hip (HCC) 12/31/2023   Hemorrhagic shock (HCC) 12/30/2023   Symptomatic anemia 12/29/2023   Hypotension 12/29/2023   Chronic diastolic  CHF (congestive heart failure) (HCC) 11/24/2023   Thigh abscess 11/21/2023   Community acquired pneumonia 11/20/2023   H/O urinary retention 11/20/2023   COVID-19 virus infection 11/20/2023   Right hip joint effusion 11/20/2023   Hospital discharge follow-up 10/07/2023   Pressure injury of skin 07/12/2023   RSV (respiratory syncytial virus infection) 06/28/2023   Volume overload 05/20/2023   Legionella pneumonia (HCC) 05/05/2023   Acute respiratory failure with hypoxia (HCC) 05/02/2023   Pulmonary edema 04/13/2023   Diarrhea 04/13/2023   Dyspnea and respiratory abnormalities 03/17/2023   Dyspnea 03/05/2023   Retroperitoneal hematoma 12/31/2022   Subcutaneous hematoma 12/31/2022   Hip osteomyelitis, right (HCC) 12/18/2022   Sepsis due to undetermined organism (HCC) 12/18/2022   Chronic pain 12/14/2022   Bacteremia 11/08/2022   Loss of weight 10/26/2022   N&V (nausea and vomiting) 10/26/2022   Cellulitis of knee, left 07/24/2022   Infected decubitus ulcer 07/23/2022   Osteomyelitis of multiple sites-chronic     Chronic anemia 02/25/2022   ESRD on hemodialysis (HCC) 02/25/2022   Abscess of buttock, right 08/25/2021   GERD (gastroesophageal reflux disease) 08/25/2021   Anxiety 08/25/2021   Decubitus ulcers 08/25/2021   Anemia 08/24/2021   AKI (acute kidney injury) 04/25/2017   Dehydration 04/25/2017   Metabolic acidosis 04/25/2017   Altered mental status 06/22/2016   Altered mental state 08/16/2015   Pressure ulcer 05/16/2015   Acute encephalopathy 05/15/2015   Malnutrition    Sepsis (HCC) 10/10/2014   HCAP (healthcare-associated pneumonia) 10/10/2014   Encephalomalacia 10/10/2014   TBI (traumatic brain injury) (HCC) 10/10/2014   Severe protein-calorie malnutrition (HCC) 10/10/2014   UTI (urinary tract infection) 08/26/2014   Hypokalemia 08/26/2014   Abnormal uterine bleeding (AUB) 06/15/2014   Essential hypertension    Seizure disorder (HCC)    Status epilepticus  (HCC) 05/02/2014   Hypertensive emergency 05/02/2014   Acute renal failure (HCC) 05/21/2013   Hyperkalemia 05/21/2013   Hypothermia 05/21/2013   Paraplegia (HCC) 06/25/2012   Suprapubic catheter (HCC) 06/25/2012   Decubitus ulcer of buttock 06/25/2012   PCP:  Edman Meade PEDLAR, FNP Pharmacy:   [facility] [place], [place] - [address] [place] [address] Phone: [phone] Fax: [phone]     Social Drivers of Health (SDOH) Social History: SDOH Screenings   Food Insecurity: Low Risk  (02/25/2024)   Received from [facility]  Housing: Low Risk  (02/25/2024)   Received from [facility]  Transportation Needs: No Transportation Needs (02/25/2024)   Received from [facility]  Utilities: Low Risk  (02/25/2024)   Received from [facility]  Depression (PHQ2-9): Low Risk  (11/20/2023)  Recent Concern: Depression (PHQ2-9) - High Risk (10/07/2023)  Tobacco Use: Low Risk  (03/09/2024)   SDOH Interventions:     Readmission Risk Interventions    03/11/2024   10:32 AM 09/22/2023    8:17 PM 07/11/2023    8:38 AM  Readmission Risk Prevention Plan  Transportation Screening Complete Complete Complete  Medication Review (RN Care Manager) Complete Complete Complete  PCP or Specialist appointment within 3-5 days of discharge Complete Complete   HRI or Home Care Consult Complete Complete Complete  SW Recovery Care/Counseling Consult Complete Complete Complete  Palliative Care Screening Not Applicable Not Applicable Not Applicable  Skilled Nursing Facility Not Applicable Not Applicable Not Applicable

## 2024-03-11 NOTE — Progress Notes (Signed)
 Pt receives out-pt HD at [facility]. MWF, 1100 chair time, will continue to assist as needed.   Delayne Sanzo [facility] [phone]

## 2024-03-11 NOTE — Progress Notes (Signed)
 PROGRESS NOTE    [name]  FMW:[id number] DOB: 06-Feb-1966 DOA: 03/09/2024 PCP: Edman Meade PEDLAR, FNP    Brief Narrative:   58 year old with history of ESRD on HD, paraplegia, chronic indwelling suprapubic catheter, seizure, diastolic CHF comes to the ED with complaints of nausea and abdominal pain for the past 2 days.  Had a recent prolonged hospitalization at [facility] from 8/6 - 8/22 for septic shock secondary to left hip septic arthritis/osteomyelitis requiring resection arthroplasty [place] by orthopedic and plastic surgery with wound VAC placement and prolonged course of IV antibiotics with hemodialysis. During this hospitalization CT chest abdomen pelvis showed new small pericardial effusion, diffuse wall thickening/proctitis, coccyx changes with concerns of possible abscess, new erosive changes to central left acetabulum concerning for cellulitis.  MRI of the left hip eventually showed chronic changes.  Eventually patient discharged from ER to hemodialysis with reported of some difficulty breathing which improved with dialysis.  Eventually called back by EDP for positive blood cultures.  ID recommended IV Rocephin .  Assessment & Plan:    Bacteremia, gram positive rod - Initial cultures thought to be gram-negative rods but later this was updated by micro being GPC.  Confirm with ID and micro team.  This appears to be contaminant, will repeat cultures this morning and discontinue antibiotics.  If repeat cultures are negative by 10/18 she can likely go home.  Given her history, best to make sure cultures truly are negative. -At [facility] in 12/2023 grew bacteroides fragilis, completed 6 weeks of IV antibiotics on 9/25.    Upper abdominal pain -CT scan is suggestive of proctitis otherwise overall unremarkable.  LFTs and lipase are normal.    Chronic osteomyelitis/chronic decubitus ulcer-  per patient also is at baseline.  CTAP Wo C-showed new small pericardial effusion,  diffuse wall thickening concerning for proctitis.  Calcified collection adjacent to tip of coccyx increased in size, erosive changes to coccyx unchanged, correlate clinically for abscess.  Changes to left femoral head, new erosive changes to central left acetabulum concerning for cellulitis. 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  Deep decubitus ulcer below the right ischium, abnormal muscular edema in left iliac us  gluteus minimus hip abductor and upper thigh musculature suggesting myositis.  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  Septic right hip joint not totally excluded.  But less likely.   ESRD/Chronic diastolic CHF Status post HD 10/15, nephrology following to optimize for further volume status and HD needs..  Schedule Monday Wednesday Friday.  Stable and euvolemic.  Echo 03/2023 EF 45 to 50% grade 2 DD.   Seizure disorder - On Keppra , resume   Hypertension- elevated. - Resume Norvasc  5 mg, carvedilol  25 mg twice daily, irbesartan  150 mg daily. IV prn   DVT prophylaxis: Heparin  Code Status: Full code  family Communication: None at bedside. Disposition Plan: ~ 2 days if cultures remain negative   PT Follow up Recs:   Subjective:  Patient is alert awake, answers all the basic questions no other complaints at this time.  Overall feels well. She understands that her cultures might have been a contaminant and is okay for us  monitoring her  Examination:  General exam: Appears calm and comfortable  Respiratory system: Clear to auscultation. Respiratory effort normal. Cardiovascular system: S1 &  S2 heard, RRR. No JVD, murmurs, rubs, gallops or clicks. No pedal edema. Gastrointestinal system:  Abdomen is nondistended, soft and nontender. No organomegaly or masses felt. Normal bowel sounds heard. Central nervous system: Alert and oriented. No focal neurological deficits. Extremities: Symmetric 5 x 5 power. Skin: No rashes, lesions or ulcers Psychiatry: Judgement and insight appear normal. Mood & affect appropriate.            Wound 03/10/24 1843 Pressure Injury Buttocks Right Stage 3 -  Full thickness tissue loss. Subcutaneous fat may be visible but bone, tendon or muscle are NOT exposed. (Active)     Wound 03/10/24 1845 Pressure Injury Buttocks Right Stage 3 -  Full thickness tissue loss. Subcutaneous fat may be visible but bone, tendon or muscle are NOT exposed. (Active)     Wound 03/10/24 1846 Pressure Injury Buttocks Left Stage 3 -  Full thickness tissue loss. Subcutaneous fat may be visible but bone, tendon or muscle are NOT exposed. (Active)     Diet Orders (From admission, onward)     Start     Ordered   03/10/24 1803  Diet renal with fluid restriction Fluid restriction: 1200 mL Fluid; Room service appropriate? Yes; Fluid consistency: Thin  Diet effective now       Question Answer Comment  Fluid restriction: 1200 mL Fluid   Room service appropriate? Yes   Fluid consistency: Thin      03/10/24 1802            Objective: Vitals:   03/10/24 1812 03/10/24 2101 03/11/24 0231 03/11/24 0601  BP: (!) 192/83 (!) 161/71 (!) 156/76 (!) 170/82  Pulse: 89 79 86 84  Resp: 18 18 16 16   Temp: 98 F (36.7 C) 98.1 F (36.7 C) 98.9 F (37.2 C) 98.8 F (37.1 C)  TempSrc: Oral Oral Oral Oral  SpO2: 98% 100% 96% 97%  Weight:      Height:        Intake/Output Summary (Last 24 hours) at 03/11/2024 1130 Last data filed at 03/11/2024 0900 Gross per 24 hour  Intake 240 ml  Output 2300 ml  Net -2060 ml   Filed Weights   03/09/24 1320  Weight: 44.9 kg    Scheduled Meds:  amLODipine   5 mg Oral QPM   carvedilol   25 mg Oral BID   Chlorhexidine  Gluconate Cloth   6 each Topical Q0600   fluconazole  200 mg Oral Daily   gabapentin   100 mg Oral BID   heparin   5,000 Units Subcutaneous Q8H   irbesartan   150 mg Oral QPM   levETIRAcetam   1,000 mg Oral QHS   Continuous Infusions:  cefTRIAXone  (ROCEPHIN )  IV     metronidazole  500 mg (03/11/24 0900)    Nutritional status     Body mass index is 18.7 kg/m.  Data Reviewed:   CBC: Recent Labs  Lab 03/05/24 1151 03/09/24 1421 03/11/24 0508  WBC 6.9 7.6 6.9  NEUTROABS 4.6 5.9  --   HGB 9.0* 9.5* 8.9*  HCT 30.1* 32.1* 30.2*  MCV 97.7 97.9 96.2  PLT 221 204 184   Basic Metabolic Panel: Recent Labs  Lab 03/05/24 1151 03/09/24 1421 03/11/24 0508  NA 139 138 138  K 4.8 4.3 3.6  CL 98 98 98  CO2 25 29 29   GLUCOSE 87 80 88  BUN 40* 20 19  CREATININE 5.16* 3.40* 3.17*  CALCIUM  9.2 9.6 9.2   GFR: Estimated Creatinine Clearance: 14 mL/min (A) (by C-G formula based on  SCr of 3.17 mg/dL (H)). Liver Function Tests: Recent Labs  Lab 03/05/24 1151 03/09/24 1421  AST 17 18  ALT 9 9  ALKPHOS 71 78  BILITOT 0.3 0.4  PROT 6.6 6.9  ALBUMIN  3.1* 3.3*   Recent Labs  Lab 03/05/24 1151 03/09/24 1421  LIPASE 59* 31   No results for input(s): AMMONIA in the last 168 hours. Coagulation Profile: No results for input(s): INR, PROTIME in the last 168 hours. Cardiac Enzymes: No results for input(s): CKTOTAL, CKMB, CKMBINDEX, TROPONINI in the last 168 hours. BNP (last 3 results) No results for input(s): PROBNP in the last 8760 hours. HbA1C: No results for input(s): HGBA1C in the last 72 hours. CBG: No results for input(s): GLUCAP in the last 168 hours. Lipid Profile: No results for input(s): CHOL, HDL, LDLCALC, TRIG, CHOLHDL, LDLDIRECT in the last 72 hours. Thyroid Function Tests: No results for input(s): TSH, T4TOTAL, FREET4, T3FREE, THYROIDAB in the last 72 hours. Anemia Panel: No results for input(s): VITAMINB12, FOLATE, FERRITIN, TIBC,  IRON , RETICCTPCT in the last 72 hours. Sepsis Labs: No results for input(s): PROCALCITON, LATICACIDVEN in the last 168 hours.  Recent Results (from the past 240 hours)  Blood culture (routine x 2)     Status: Abnormal (Preliminary result)   Collection Time: 03/09/24  8:35 PM   Specimen: BLOOD  Result Value Ref Range Status   Specimen Description   Final    BLOOD RIGHT ANTECUBITAL Performed at [facility], [address] [place], [place] [address]    Special Requests   Final    BOTTLES DRAWN AEROBIC AND ANAEROBIC Blood Culture adequate volume Performed at [facility], [address], [place], [place] [address]    Culture  Setup Time   Final    BOTTLES DRAWN AEROBIC AND ANAEROBIC GRAM POSITIVE COCCI Gram Stain Report Called to,Read Back By and Verified With: CHRISTELLA BREEN RN [phone] K FORSY[place]  Organism ID to follow PREVIOUSLY REPORTED AS: GRAM NEGATIVE RODS CORRECTED RESULTS CALLED TO: CRITICAL RESULT CALLED TO, READ BACK BY AND VERIFIED WITH: MINDY MARSH, RN 03/10/2024 1939 PR    Culture (A)  Final    STAPHYLOCOCCUS EPIDERMIDIS STAPHYLOCOCCUS HOMINIS THE SIGNIFICANCE OF ISOLATING THIS ORGANISM FROM A SINGLE SET OF BLOOD CULTURES WHEN MULTIPLE SETS ARE DRAWN IS UNCERTAIN. PLEASE NOTIFY THE MICROBIOLOGY DEPARTMENT WITHIN ONE WEEK IF SPECIATION AND SENSITIVITIES ARE REQUIRED. Performed at [facility], [address], [place], [place] [address]    Report Status PENDING  Incomplete  Blood Culture ID Panel (Reflexed)     Status: Abnormal   Collection Time: 03/09/24  8:35 PM  Result Value Ref Range Status   Enterococcus faecalis NOT DETECTED NOT DETECTED Final   Enterococcus Faecium NOT DETECTED NOT DETECTED Final   Listeria monocytogenes NOT DETECTED NOT DETECTED Final   Staphylococcus species DETECTED (A) NOT DETECTED Final    Comment: CRITICAL RESULT CALLED TO, READ BACK BY AND VERIFIED WITH: MINDY MARSH, RN 03/10/2024 1939 PR    Staphylococcus aureus (BCID) NOT  DETECTED NOT DETECTED Final   Staphylococcus epidermidis DETECTED (A) NOT DETECTED Final    Comment: Methicillin (oxacillin) resistant coagulase negative staphylococcus. Possible blood culture contaminant (unless isolated from more than one blood culture draw or clinical case suggests pathogenicity). No antibiotic treatment is indicated for blood  culture contaminants. CRITICAL RESULT CALLED TO, READ BACK BY AND VERIFIED WITH: MINDY MARSH, RN 03/10/2024 1939 PR    Staphylococcus lugdunensis NOT DETECTED NOT DETECTED Final   Streptococcus species NOT  DETECTED NOT DETECTED Final   Streptococcus agalactiae NOT DETECTED NOT DETECTED Final   Streptococcus pneumoniae NOT DETECTED NOT DETECTED Final   Streptococcus pyogenes NOT DETECTED NOT DETECTED Final   A.calcoaceticus-baumannii NOT DETECTED NOT DETECTED Final   Bacteroides fragilis NOT DETECTED NOT DETECTED Final   Enterobacterales NOT DETECTED NOT DETECTED Final   Enterobacter cloacae complex NOT DETECTED NOT DETECTED Final   Escherichia coli NOT DETECTED NOT DETECTED Final   Klebsiella aerogenes NOT DETECTED NOT DETECTED Final   Klebsiella oxytoca NOT DETECTED NOT DETECTED Final   Klebsiella pneumoniae NOT DETECTED NOT DETECTED Final   Proteus species NOT DETECTED NOT DETECTED Final   Salmonella species NOT DETECTED NOT DETECTED Final   Serratia marcescens NOT DETECTED NOT DETECTED Final   Haemophilus influenzae NOT DETECTED NOT DETECTED Final   Neisseria meningitidis NOT DETECTED NOT DETECTED Final   Pseudomonas aeruginosa NOT DETECTED NOT DETECTED Final   Stenotrophomonas maltophilia NOT DETECTED NOT DETECTED Final   Candida albicans NOT DETECTED NOT DETECTED Final   Candida auris NOT DETECTED NOT DETECTED Final   Candida glabrata NOT DETECTED NOT DETECTED Final   Candida krusei NOT DETECTED NOT DETECTED Final   Candida parapsilosis NOT DETECTED NOT DETECTED Final   Candida tropicalis NOT DETECTED NOT DETECTED Final   Cryptococcus  neoformans/gattii NOT DETECTED NOT DETECTED Final   Methicillin resistance mecA/C DETECTED (A) NOT DETECTED Final    Comment: CRITICAL RESULT CALLED TO, READ BACK BY AND VERIFIED WITH: GRAEME FERRIES, RN 03/10/2024 1939 PR Performed at [facility], [address] [place], [place] [address]   Blood culture (routine x 2)     Status: None (Preliminary result)   Collection Time: 03/09/24  9:02 PM   Specimen: BLOOD  Result Value Ref Range Status   Specimen Description BLOOD BLOOD RIGHT HAND  Final   Special Requests   Final    BOTTLES DRAWN AEROBIC AND ANAEROBIC Blood Culture adequate volume   Culture   Final    NO GROWTH 2 DAYS Performed at [facility], [address], [place], [place] [address]    Report Status PENDING  Incomplete         Radiology Studies: MR HIP LEFT WO CONTRAST Result Date: 03/10/2024 CLINICAL DATA:  Osteomyelitis EXAM: MR OF THE LEFT HIP WITHOUT CONTRAST TECHNIQUE: Multiplanar, multisequence MR imaging was performed. No intravenous contrast was administered. COMPARISON:  CT scan 03/09/2024 FINDINGS: Bones and joints: Chronic osteomyelitis of the left proximal femur with complete bony destruction of the femoral head and intertrochanteric region. The remaining shaft of the femur is displaced about 6 cm laterally from the acetabulum and has internal marrow edema compatible with osteomyelitis, along with a posterior cutaneous and subcutaneous ulceration extending to towards and possibly into the remainder of the left hip joint. Edema and scalloping in the acetabulum likely reflecting chronic infection. The native right hip is chronically posteriorly displaced along the posterior lip of the acetabulum, with a large joint effusion, flattening and scalloping of the acetabulum, and flattening of portions of the femoral head favoring components of avascular necrosis. Septic right hip joint is not totally excluded although is less likely given the lack of surrounding marrow  edema in the acetabulum and right proximal femur. Bony destructive findings of the lower coccyx, with the space occupied by a 4.2 by 5.1 by 2.4 cm collection of fluid and possibly dependent calcifications as on image 27 series 16. This collection appear to have calcifications along its margins as well especially posteriorly.  Deep decubitus ulcer below the right ischium and extends to the thinned inferior pubic ramus region. Muscles and tendons Muscles and tendons: Abnormal muscular edema in the left iliacus, gluteus minimus, hip adductor, and upper thigh musculature suggesting myositis. Other findings Miscellaneous: Regional muscular atrophy. Suprapubic catheter noted Subcutaneous edema along the left perineum left upper thigh region and along the right posterolateral buttock region. IMPRESSION: 1. Chronic osteomyelitis of the left proximal femur with complete bony destruction of the femoral head and intertrochanteric region. The remaining shaft of the femur is displaced about 6 cm laterally from the acetabulum and has internal marrow edema compatible with osteomyelitis, along with a posterior cutaneous and subcutaneous decubitus ulceration extending to towards and possibly into the remainder of the left hip joint. 2. Bony destructive findings of the lower coccyx, with the space occupied by a 4.2 by 5.1 by 2.4 cm collection of fluid and possibly dependent calcifications. This collection appear to have calcifications along its margins as well especially posteriorly. 3. Deep decubitus ulcer below the right ischium and extends to the thinned inferior pubic ramus region. 4. Abnormal muscular edema in the left iliacus, gluteus minimus, hip adductor, and upper thigh musculature suggesting myositis. 5. 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  Septic right hip joint is not totally excluded although is less likely given the lack of surrounding marrow edema in the acetabulum and right proximal femur. Electronically Signed   By: Ryan Salvage M.D.   On: 03/10/2024 11:00   CT ABDOMEN PELVIS WO CONTRAST Result Date: 03/09/2024 EXAM: CT ABDOMEN AND PELVIS WITHOUT CONTRAST 03/09/2024 07:53:25 PM TECHNIQUE: CT of the abdomen and pelvis was performed without the administration of intravenous contrast. Multiplanar reformatted images are provided for review. Automated exposure control, iterative reconstruction, and/or weight-based adjustment of the mA/kV was utilized to reduce the radiation dose to as low as reasonably achievable. COMPARISON: CT abdomen and pelvis 12/31/2023. CLINICAL HISTORY: Abdominal pain, acute, nonlocalized. Pt arrived via [facility] from home c/o N/V, abdominal pain X 2 days. Pt has LUE restriction for dialysis and last received dialysis yesterday. Per [facility], Pt is also hypertensive with a BP 188/95. FINDINGS: LOWER CHEST: The heart is enlarged with a new small pericardial effusion. There is a stable small right pleural effusion with atelectasis. No acute abnormality. LIVER: The liver is unremarkable. GALLBLADDER AND BILE DUCTS: Gallbladder is unremarkable. No biliary ductal dilatation. SPLEEN: No acute abnormality. PANCREAS: No acute abnormality. ADRENAL GLANDS: No acute abnormality. KIDNEYS, URETERS AND BLADDER: Bilateral renal cysts are stable. No stones in the kidneys or ureters. No hydronephrosis. No perinephric or periureteral stranding. Urinary bladder is unremarkable. GI AND BOWEL: Stomach demonstrates no acute abnormality. There is no bowel obstruction. There is diffuse rectal wall thickening, particularly posteriorly. The appendix appears normal. PERITONEUM AND RETROPERITONEUM: No ascites. No free air. VASCULATURE: Aorta is normal in caliber. LYMPH NODES: No lymphadenopathy. REPRODUCTIVE ORGANS: No acute abnormality. BONES AND SOFT  TISSUES: chronic posterior right hip dislocation and joint effusion are again noted. There has been interval erosive change or sur- surgical amputation of the left femoral head. There are increasing soft tissue calcifications surrounding the proximal left femur. Um increasing erosive changes involving the central acetabulum. There is increased soft tissue density throughout the acetabulum and in this region with deep posterior decubitus ulceration. No significant joint effusion or soft tissue gas allowing for lack  of intravenous contrast. Thoracic patient hardware again noted. Decubitus ulceration is seen at this level similar to the prior study. Joint effusion persists, but soft tissue air is no longer identified in the left hip. Peripherally calcified oval collection is seen abutting the tip of the coccyx measuring 3.9x2.8x4.7 cm. This has increased in size and appears more well defined. There is surrounding inflammatory stranding in this region. Is about the underlying tip of the coccyx where there are some erosive changes similar to prior. IMPRESSION: 1. New small pericardial effusion. 2. Diffuse rectal wall thickening concerning for proctitis. 3. No acute findings. Oval peripherally calcified collection adjacent to the tip of the coccyx which has increased in size. Underlying erosive changes of the coccyx appear unchanged. Correlate clinically for abscess. 4. Interval amputation or erosive changes of the left femoral head. There are new erosive changes of the central left acetabulum concerning for osteomyelitis. Posterior left hip decubitus ulcer persists. Electronically signed by: Greig Pique MD 03/09/2024 08:12 PM EDT RP Workstation: [id number]   DG Chest 1 View Result Date: 03/09/2024 CLINICAL DATA:  [id number] SOB (shortness of breath) [id number] EXAM: CHEST  1 VIEW COMPARISON:  03/05/2024 FINDINGS: Lower lung volumes. Bilateral perihilar interstitial opacities. Hazy airspace opacities in both lung bases.  Blunting of the costophrenic sulci. No pneumothorax. Mild cardiomegaly. Tortuous aorta with aortic atherosclerosis. No acute fracture or destructive lesions. Bilateral fusion rods again noted. Resection of the proximal right clavicle. IMPRESSION: Cardiomegaly with mild pulmonary edema. Small bilateral pleural effusions. Electronically Signed   By: Rogelia Myers M.D.   On: 03/09/2024 15:10           LOS: 1 day   Time spent= 35 mins    [place]urgess JAYSON Dare, MD [facility]  If 7PM-7AM, please contact night-coverage  03/11/2024, 11:30 AM

## 2024-03-12 DIAGNOSIS — R7881 Bacteremia: Secondary | ICD-10-CM | POA: Diagnosis not present

## 2024-03-12 DIAGNOSIS — G40909 Epilepsy, unspecified, not intractable, without status epilepticus: Secondary | ICD-10-CM | POA: Diagnosis not present

## 2024-03-12 DIAGNOSIS — R1013 Epigastric pain: Secondary | ICD-10-CM

## 2024-03-12 DIAGNOSIS — I10 Essential (primary) hypertension: Secondary | ICD-10-CM | POA: Diagnosis not present

## 2024-03-12 DIAGNOSIS — I5032 Chronic diastolic (congestive) heart failure: Secondary | ICD-10-CM | POA: Diagnosis not present

## 2024-03-12 LAB — BASIC METABOLIC PANEL WITH GFR
Anion gap: 14 (ref 5–15)
BUN: 34 mg/dL — ABNORMAL HIGH (ref 6–20)
CO2: 26 mmol/L (ref 22–32)
Calcium: 8.7 mg/dL — ABNORMAL LOW (ref 8.9–10.3)
Chloride: 98 mmol/L (ref 98–111)
Creatinine, Ser: 5.01 mg/dL — ABNORMAL HIGH (ref 0.44–1.00)
GFR, Estimated: 10 mL/min — ABNORMAL LOW
Glucose, Bld: 88 mg/dL (ref 70–99)
Potassium: 4.3 mmol/L (ref 3.5–5.1)
Sodium: 138 mmol/L (ref 135–145)

## 2024-03-12 LAB — CBC
HCT: 29.7 % — ABNORMAL LOW (ref 36.0–46.0)
Hemoglobin: 8.8 g/dL — ABNORMAL LOW (ref 12.0–15.0)
MCH: 28.2 pg (ref 26.0–34.0)
MCHC: 29.6 g/dL — ABNORMAL LOW (ref 30.0–36.0)
MCV: 95.2 fL (ref 80.0–100.0)
Platelets: 174 K/uL (ref 150–400)
RBC: 3.12 MIL/uL — ABNORMAL LOW (ref 3.87–5.11)
RDW: 17.3 % — ABNORMAL HIGH (ref 11.5–15.5)
WBC: 7 K/uL (ref 4.0–10.5)
nRBC: 0 % (ref 0.0–0.2)

## 2024-03-12 LAB — MAGNESIUM: Magnesium: 2.3 mg/dL (ref 1.7–2.4)

## 2024-03-12 LAB — PHOSPHORUS: Phosphorus: 7.5 mg/dL — ABNORMAL HIGH (ref 2.5–4.6)

## 2024-03-12 LAB — CULTURE, BLOOD (ROUTINE X 2): Special Requests: ADEQUATE

## 2024-03-12 MED ORDER — ONDANSETRON HCL 4 MG/2ML IJ SOLN
INTRAMUSCULAR | Status: AC
  Filled 2024-03-12: qty 2

## 2024-03-12 NOTE — Plan of Care (Signed)
  Problem: Education: Goal: Knowledge of General Education information will improve Description: Including pain rating scale, medication(s)/side effects and non-pharmacologic comfort measures Outcome: Progressing   Problem: Clinical Measurements: Goal: Ability to maintain clinical measurements within normal limits will improve Outcome: Progressing   Problem: Clinical Measurements: Goal: Diagnostic test results will improve Outcome: Progressing   

## 2024-03-12 NOTE — Progress Notes (Signed)
 PROGRESS NOTE    [name]  FMW:[id number] DOB: March 28, 1966 DOA: 03/09/2024 PCP: Edman Meade PEDLAR, FNP   Brief Hospital issue narrative: As per H&P written by Dr. Pearlean on 03/10/2024 [name] is a 58 y.o. female with medical history significant for ESRD on dialysis, paraplegia, chronic indwelling suprapubic catheter, seizures, diastolic CHF. Patient presented to the ED yesterday with complaints of vomiting and upper abdominal pain of 2 days duration.  Per ED provider notes, she describes stabbing upper abdominal pain that sometimes radiates into her chest.  But she tells me the pain is around her lower rib cage.  She denies any falls, no cough, no unusual physical activity. Her chronic decubitus ulcer is unchanged from baseline.   Recent prolonged hospitalization at [facility]-8/6 to 8/22-presented to [facility] ER then transferred to [facility]- for septic shock requiring pressors, left hip septic arthritis (aspiration by IR was dry )Bacteroides fragilis bacteremia and left femur osteomyelitis.  Was placed on Vanco and Zosyn , but with increasing white count was switched to metronidazole  cefepime  and vancomycin .  Repeat blood cultures were negative.  Underwent resection arthroplasty [place] by orthopedic and plastic surgery 01/09/2024.  IntraOp cultures negative.  Wound VAC was placed over left hip.  Required 1 unit of blood.  Patient was discharged on IV cefepime  and vancomycin  dosed with HD Monday Wednesday Friday.  Cultures 8/15 also grew fungus, for which she is on fluconazole   ED Course: Stable vitals.  Troponin 86 x 2.  UA with moderate leukocytes.  Chest x-ray with mild pulmonary edema and small bilateral pleural effusions. CTAP Wo C-showed new small pericardial effusion, diffuse wall thickening concerning for proctitis.  Calcified collection adjacent to tip of coccyx increased in size, erosive changes to coccyx unchanged, correlate clinically for  abscess.  Changes to left femoral head, new erosive changes to central left acetabulum concerning for cellulitis. Patient boarded in the ED till morning, and got MRI of the left hip without contrast -multiple findings mostly chronic, - chronic osteomyelitis of the left proximal femur, fluid collection lower coccyx-appear to be calcifications.  Deep decubitus ulcer below the right ischium, abnormal muscular edema in left iliac us  gluteus minimus hip abductor and upper thigh musculature suggesting myositis.  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  Septic right hip joint not totally excluded.  But less likely.   After MRI, plan was for patient to be discharged from the ED to go get dialyzed, unfortunately dialysis center [facility] could not plug patient in for today until tomorrow.  Then patient complained of difficulty breathing, hence nephrology was consulted for dialysis, to be discharged after HD.  EDP was called with positive blood cultures-negative rods.  Dr. Luiz recommended hospitalization and IV Rocephin  pending final speciation and cultures.  Assessment and plan:  Bacteremia, gram positive rod - Initial cultures thought to be gram-negative rods but later this was updated by micro being GPC.  Confirm with ID and micro team.  This appears to be contaminant, will repeat cultures this morning and discontinue antibiotics.  If repeat cultures are negative by 10/18 she can likely go home.  Given her history, best to make sure cultures truly are negative to minimize the chances of infection and readmission. -At [facility] in 12/2023 grew bacteroides fragilis, completed 6 weeks of IV antibiotics on 9/25. -Currently afebrile and not demonstrating the presence of systemic  infection.    Upper abdominal pain -CT scan is suggestive of proctitis otherwise overall  unremarkable.  LFTs and lipase are normal. -Continue supportive care and monitoring.    Chronic osteomyelitis/chronic decubitus ulcer-  per patient also is at baseline.  CTAP Wo C-showed new small pericardial effusion, diffuse wall thickening concerning for proctitis.  Calcified collection adjacent to tip of coccyx increased in size, erosive changes to coccyx unchanged, correlate clinically for abscess.  Changes to left femoral head, new erosive changes to central left acetabulum concerning for cellulitis. 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  Deep decubitus ulcer below the right ischium, abnormal muscular edema in left iliac us  gluteus minimus hip abductor and upper thigh musculature suggesting myositis.  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  Septic right hip joint not totally excluded.  But less likely at the moment..   ESRD/Chronic diastolic CHF -Status post HD 10/15, appreciate assistance and recommendation by nephrology service regarding optimization for further volume status and HD needs..   - Regular schedule schedule Monday Wednesday Friday.  - Patient appears to be stable and euvolemic.  -Most recent echo echo 03/2023 EF 45 to 50% grade 2 DD. -Continue volume management with dialysis.   Seizure disorder - On Keppra , - No seizure activity appreciated.   Hypertension-  -Continue to follow vital signs. - Continue treatment with Norvasc , carvedilol , and valsartan - Low-sodium diet intake discussed with patient.     Wound 03/10/24 1843 Pressure Injury Buttocks Right Stage 3 -  Full thickness tissue loss. Subcutaneous fat may be visible but bone, tendon or muscle are NOT exposed. (Active)      Wound 03/10/24 1845 Pressure Injury Buttocks Right Stage 3 -  Full thickness tissue loss. Subcutaneous fat may be visible but bone, tendon or muscle are NOT exposed. (Active)     Wound 03/10/24 1846 Pressure Injury Buttocks Left Stage 3 -  Full thickness tissue loss. Subcutaneous fat may be visible but bone, tendon or muscle are NOT exposed. (Active)     Diet Orders (From admission, onward)     Start     Ordered   03/10/24 1803  Diet renal with fluid restriction Fluid restriction: 1200 mL Fluid; Room service appropriate? Yes; Fluid consistency: Thin  Diet effective now       Question Answer Comment  Fluid restriction: 1200 mL Fluid   Room service appropriate? Yes   Fluid consistency: Thin      03/10/24 1802            Objective: Vitals:   03/12/24 1145 03/12/24 1148 03/12/24 1200 03/12/24 1246  BP: 139/72 (!) 142/74 (!) 147/77 (!) 150/79  Pulse: 82 85 86 87  Resp: 17 19 20    Temp:   97.6 F (36.4 C) 98.5 F (36.9 C)  TempSrc:    Oral  SpO2: 100% 100% 100% 98%  Weight:   49.4 kg   Height:       General exam: Alert, awake, oriented x 3; in no major distress while receiving hemodialysis.  Expresses no chest pain or shortness of breath.  Good saturation on chronic supplementation. Respiratory system: Good air movement bilaterally.  No using accessory muscles. Cardiovascular system: Rate controlled, no rubs, no gallops, no JVD. Gastrointestinal system: Abdomen is nondistended, soft  and nontender. No organomegaly or masses felt. Normal bowel sounds heard. Central nervous system: Generally weak.  No focal neurological deficits. Extremities: No cyanosis or clubbing. Skin: No petechiae. Psychiatry: Judgement and insight appear normal.  Flat affect appreciated on exam.   Intake/Output Summary (Last 24 hours) at 03/12/2024 1732 Last data filed at 03/12/2024 1300 Gross per 24 hour  Intake 600 ml  Output 2850 ml  Net -2250 ml   Filed Weights   03/12/24 0808 03/12/24 1200   Weight: 51.3 kg 49.4 kg    Scheduled Meds:  amLODipine   5 mg Oral QPM   calcitRIOL   2 mcg Oral Q M,W,F-HD   carvedilol   25 mg Oral BID   Chlorhexidine  Gluconate Cloth  6 each Topical Q0600   Chlorhexidine  Gluconate Cloth  6 each Topical Q0600   cinacalcet   30 mg Oral Q M,W,F-HD   [START ON 03/15/2024] darbepoetin (ARANESP ) injection - DIALYSIS  200 mcg Subcutaneous Q Mon-1800   fluconazole  200 mg Oral Daily   gabapentin   100 mg Oral BID   heparin   5,000 Units Subcutaneous Q8H   irbesartan   150 mg Oral QPM   levETIRAcetam   1,000 mg Oral QHS   Continuous Infusions:  cefTRIAXone  (ROCEPHIN )  IV 2 g (03/12/24 1330)   metronidazole  500 mg (03/12/24 1246)    Nutritional status     Body mass index is 20.58 kg/m.  Data Reviewed:   CBC: Recent Labs  Lab 03/09/24 1421 03/11/24 0508 03/12/24 0437  WBC 7.6 6.9 7.0  NEUTROABS 5.9  --   --   HGB 9.5* 8.9* 8.8*  HCT 32.1* 30.2* 29.7*  MCV 97.9 96.2 95.2  PLT 204 184 174   Basic Metabolic Panel: Recent Labs  Lab 03/09/24 1421 03/11/24 0508 03/12/24 0437  NA 138 138 138  K 4.3 3.6 4.3  CL 98 98 98  CO2 29 29 26   GLUCOSE 80 88 88  BUN 20 19 34*  CREATININE 3.40* 3.17* 5.01*  CALCIUM  9.6 9.2 8.7*  MG  --   --  2.3  PHOS  --   --  7.5*   GFR: Estimated Creatinine Clearance: 9.5 mL/min (A) (by C-G formula based on SCr of 5.01 mg/dL (H)).  Liver Function Tests: Recent Labs  Lab 03/09/24 1421  AST 18  ALT 9  ALKPHOS 78  BILITOT 0.4  PROT 6.9  ALBUMIN  3.3*   Recent Labs  Lab 03/09/24 1421  LIPASE 31    Recent Results (from the past 240 hours)  Urine Culture     Status: Abnormal   Collection Time: 03/09/24  3:45 PM   Specimen: Urine, Clean Catch  Result Value Ref Range Status   Specimen Description   Final    URINE, CLEAN CATCH Performed at [facility], [address] [place], [place] [address]    Special Requests   Final    NONE Performed at [facility], [address] [place], [place]  [address]    Culture (A)  Final    >=100,000 COLONIES/mL MULTIPLE SPECIES PRESENT, SUGGEST RECOLLECTION   Report Status 03/11/2024 FINAL  Final  Blood culture (routine x 2)     Status: Abnormal   Collection Time: 03/09/24  8:35 PM   Specimen: BLOOD  Result Value Ref Range Status   Specimen Description   Final    BLOOD RIGHT ANTECUBITAL Performed at [facility], [address], [place], [place] [address]    Special Requests   Final    BOTTLES DRAWN AEROBIC  AND ANAEROBIC Blood Culture adequate volume Performed at [facility], [address], [place], [place] [address]    Culture  Setup Time   Final    BOTTLES DRAWN AEROBIC AND ANAEROBIC GRAM POSITIVE COCCI Gram Stain Report Called to,Read Back By and Verified With: CHRISTELLA BREEN RN [phone] K FORSYTH  Organism ID to follow PREVIOUSLY REPORTED AS: GRAM NEGATIVE RODS CORRECTED RESULTS CALLED TO: CRITICAL RESULT CALLED TO, READ BACK BY AND VERIFIED WITH: MINDY MARSH, RN 03/10/2024 1939 PR    Culture (A)  Final    STAPHYLOCOCCUS EPIDERMIDIS STAPHYLOCOCCUS HOMINIS THE SIGNIFICANCE OF ISOLATING THIS ORGANISM FROM A SINGLE SET OF BLOOD CULTURES WHEN MULTIPLE SETS ARE DRAWN IS UNCERTAIN. PLEASE NOTIFY THE MICROBIOLOGY DEPARTMENT WITHIN ONE WEEK IF SPECIATION AND SENSITIVITIES ARE REQUIRED. Performed at [facility], [address], [place], [place] [address]    Report Status 03/12/2024 FINAL  Final  Blood Culture ID Panel (Reflexed)     Status: Abnormal   Collection Time: 03/09/24  8:35 PM  Result Value Ref Range Status   Enterococcus faecalis NOT DETECTED NOT DETECTED Final   Enterococcus Faecium NOT DETECTED NOT DETECTED Final   Listeria monocytogenes NOT DETECTED NOT DETECTED Final   Staphylococcus species DETECTED (A) NOT DETECTED Final    Comment: CRITICAL RESULT CALLED TO, READ BACK BY AND VERIFIED WITH: MINDY MARSH, RN 03/10/2024 1939 PR    Staphylococcus aureus (BCID) NOT DETECTED NOT DETECTED Final   Staphylococcus epidermidis  DETECTED (A) NOT DETECTED Final    Comment: Methicillin (oxacillin) resistant coagulase negative staphylococcus. Possible blood culture contaminant (unless isolated from more than one blood culture draw or clinical case suggests pathogenicity). No antibiotic treatment is indicated for blood  culture contaminants. CRITICAL RESULT CALLED TO, READ BACK BY AND VERIFIED WITH: MINDY MARSH, RN 03/10/2024 1939 PR    Staphylococcus lugdunensis NOT DETECTED NOT DETECTED Final   Streptococcus species NOT DETECTED NOT DETECTED Final   Streptococcus agalactiae NOT DETECTED NOT DETECTED Final   Streptococcus pneumoniae NOT DETECTED NOT DETECTED Final   Streptococcus pyogenes NOT DETECTED NOT DETECTED Final   A.calcoaceticus-baumannii NOT DETECTED NOT DETECTED Final   Bacteroides fragilis NOT DETECTED NOT DETECTED Final   Enterobacterales NOT DETECTED NOT DETECTED Final   Enterobacter cloacae complex NOT DETECTED NOT DETECTED Final   Escherichia coli NOT DETECTED NOT DETECTED Final   Klebsiella aerogenes NOT DETECTED NOT DETECTED Final   Klebsiella oxytoca NOT DETECTED NOT DETECTED Final   Klebsiella pneumoniae NOT DETECTED NOT DETECTED Final   Proteus species NOT DETECTED NOT DETECTED Final   Salmonella species NOT DETECTED NOT DETECTED Final   Serratia marcescens NOT DETECTED NOT DETECTED Final   Haemophilus influenzae NOT DETECTED NOT DETECTED Final   Neisseria meningitidis NOT DETECTED NOT DETECTED Final   Pseudomonas aeruginosa NOT DETECTED NOT DETECTED Final   Stenotrophomonas maltophilia NOT DETECTED NOT DETECTED Final   Candida albicans NOT DETECTED NOT DETECTED Final   Candida auris NOT DETECTED NOT DETECTED Final   Candida glabrata NOT DETECTED NOT DETECTED Final   Candida krusei NOT DETECTED NOT DETECTED Final   Candida parapsilosis NOT DETECTED NOT DETECTED Final   Candida tropicalis NOT DETECTED NOT DETECTED Final   Cryptococcus neoformans/gattii NOT DETECTED NOT DETECTED Final    Methicillin resistance mecA/C DETECTED (A) NOT DETECTED Final    Comment: CRITICAL RESULT CALLED TO, READ BACK BY AND VERIFIED WITH: GRAEME FERRIES, RN 03/10/2024 1939 PR Performed at [facility], [address], [place], [place] [address]  Blood culture (routine x 2)     Status: None (Preliminary result)   Collection Time: 03/09/24  9:02 PM   Specimen: BLOOD  Result Value Ref Range Status   Specimen Description BLOOD BLOOD RIGHT HAND  Final   Special Requests   Final    BOTTLES DRAWN AEROBIC AND ANAEROBIC Blood Culture adequate volume   Culture   Final    NO GROWTH 3 DAYS Performed at [facility], [address], [place], [place] [address]    Report Status PENDING  Incomplete  Culture, blood (Routine X 2) w Reflex to ID Panel     Status: None (Preliminary result)   Collection Time: 03/11/24  9:15 AM   Specimen: BLOOD  Result Value Ref Range Status   Specimen Description BLOOD BLOOD RIGHT ARM  Final   Special Requests   Final    BOTTLES DRAWN AEROBIC ONLY Blood Culture adequate volume   Culture   Final    NO GROWTH < 24 HOURS Performed at [facility], [address], [place], [place] [address]    Report Status PENDING  Incomplete  Culture, blood (Routine X 2) w Reflex to ID Panel     Status: None (Preliminary result)   Collection Time: 03/11/24  9:19 AM   Specimen: BLOOD  Result Value Ref Range Status   Specimen Description BLOOD BLOOD RIGHT ARM  Final   Special Requests   Final    BOTTLES DRAWN AEROBIC ONLY Blood Culture adequate volume   Culture   Final    NO GROWTH < 24 HOURS Performed at [facility], [address] [place], [place] [address]    Report Status PENDING  Incomplete      Radiology Studies: No results found.    LOS: 2 days   Time spent= 35 mins    Eric Nunnery, MD [facility]  If 7PM-7AM, please contact night-coverage  03/12/2024, 5:32 PM

## 2024-03-12 NOTE — Progress Notes (Signed)
 [facility] Progress Note  Name: [name] MRN: [id number] DOB: [birth year]  Chief Complaint:  Nausea and vomiting   Subjective:  She had 350 mL UOP over 10/16 charted.  Seen and examined on dialysis.  Procedure supervised.  Blood pressure 138/78 and HR 89.  Tolerating goal.  She feels fine and is resting.  States she's not sure of discharge plans   Review of systems:  Denies shortness of breath or chest pain  Denies n/v   --------------------- Background on consult:  [name] is a 58 y.o. female with a history of end-stage renal disease on hemodialysis Monday Wednesday Friday at [facility], paraplegia, chronic indwelling suprapubic catheter and chronic diastolic CHF who presented to the hospital with nausea with vomiting and abdominal pain.  Thankfully, her nausea and vomiting is better today.  She has a history of recent osteomyelitis of the left hip.  She was in the ER overnight 10/14-10/15 awaiting an MRI which later demonstrated chronic osteomyelitis, myositis, fluid collection near the lower coccyx, a deep decubitus ulcer, and right hip joint effusion.  She also developed shortness of breath.  We did dialysis here on 10/15 with 2.3 kg UF.  She states that her shortness of breath has improved.  She states that she has been told to wear oxygen all the time and often just does this at night.  Nephrology is consulted for assistance with management of ESRD.  Note one set of blood cultures from 10/14 are now positive for staph epidermidis.  The other set from 03/09/24 is NGTD.  Repeat blood cultures are ordered.  She was recently on outpatient abx - cephalexin  500 mg daily which she is still taking.  She was previously on vanomycin and cefepime  with outpatient HD - the HD RN was unable to tell me the last date given.  Per discussion with primary team many MRI findings were felt chronic.  Per charting, she completed 6 weeks of IV abx on 02/19/24.       Intake/Output Summary (Last 24 hours) at 03/12/2024 1010 Last data filed at 03/12/2024 0500 Gross per 24 hour  Intake 780 ml  Output 350 ml  Net 430 ml    Vitals:  Vitals:   03/12/24 0915 03/12/24 0930 03/12/24 0945 03/12/24 1000  BP: (!) 159/76 (!) 154/81 (!) 151/84 139/78  Pulse: 83 83 86 86  Resp: 17 17 18 18   Temp:      TempSrc:      SpO2: 96% 95% 94% 94%  Weight:      Height:         Physical Exam:  General adult female in bed in no acute distress HEENT normocephalic atraumatic extraocular movements intact sclera anicteric Neck supple trachea midline Lungs clear to auscultation bilaterally normal work of breathing at rest on 2 liters oxygen  Heart S1S2 no rub Abdomen soft nontender nondistended Extremities no edema; grossly decreased bulk Psych normal mood and affect Neuro alert and oriented x 3 provides hx and follows commands  Access LUE AVF in use   Medications reviewed   Labs:     Latest Ref Rng & Units 03/12/2024    4:37 AM 03/11/2024    5:08 AM 03/09/2024    2:21 PM  BMP  Glucose 70 - 99 mg/dL 88  88  80   BUN 6 - 20 mg/dL 34  19  20   Creatinine 0.44 - 1.00 mg/dL 4.98  6.82  6.59   Sodium 135 - 145  mmol/L 138  138  138   Potassium 3.5 - 5.1 mmol/L 4.3  3.6  4.3   Chloride 98 - 111 mmol/L 98  98  98   CO2 22 - 32 mmol/L 26  29  29    Calcium  8.9 - 10.3 mg/dL 8.7  9.2  9.6       Outpatient HD orders:  Monday Wednesday Friday at [facility] 3 hrs 45 minutes  EDW 46.5 kg  Left AVF  15 g Standard dialyzer BF 400 ml/hr DF 500 ml/hr 2K /2.5 calcium   Last post weight: 51 kg from 03/08/24  Meds: calcitriol  2 mcg MWF,  Sensipar  30 mg MWF with HD and mircera 200 mcg was given on last Monday 03/01/24      Assessment/Plan:    # ESRD  - HD per MWF schedule at [facility]  - HD today per her regular schedule - Would ensure that fluconazole is renally dosed for the indication    # Chronic osteomyelitis - see MRI results  -  abx per primary team discretion     # HTN  - optimize volume status with HD  - continue other current regimen   # Anemia of CKD  - Recent ESA administration.  ESA would be due again on 10/20 if still here.  I have ordered aranesp  200 mcg every Monday while here.    # Chronic diastolic CHF - optimize volume status with HD   # GPC Bacteremia  - Repeat blood cultures from 03/11/24 are NGTD - note one set of cultures from 10/14 is NGTD suggestive of possible contaminant    # Metabolic bone disease - resumed sensipar  and calcitriol  - hyperphosphatemia.  No binders on home med list and she states not on anything at home.   - HD today and sort the phos issue out with her usual team as an outpatient as they know her coverage and prior medication tolerance as well as recent outpatient trends on home diet.  Note one reading less than 3 recently   Disposition - per primary team.  She will not be physically seen over the weekend by nephrology.  Please do not hesitate to reach out with any questions or if she has a change in her clinical status     Christina JAYSON Saba, MD 03/12/2024 10:36 AM

## 2024-03-12 NOTE — Plan of Care (Signed)
  Problem: Education: Goal: Knowledge of General Education information will improve Description: Including pain rating scale, medication(s)/side effects and non-pharmacologic comfort measures Outcome: Progressing   Problem: Health Behavior/Discharge Planning: Goal: Ability to manage health-related needs will improve Outcome: Progressing   Problem: Clinical Measurements: Goal: Ability to maintain clinical measurements within normal limits will improve Outcome: Progressing Goal: Will remain free from infection Outcome: Progressing Goal: Diagnostic test results will improve Outcome: Progressing Goal: Respiratory complications will improve Outcome: Progressing Goal: Cardiovascular complication will be avoided Outcome: Progressing   Problem: Coping: Goal: Level of anxiety will decrease Outcome: Progressing   Problem: Elimination: Goal: Will not experience complications related to bowel motility Outcome: Progressing Goal: Will not experience complications related to urinary retention Outcome: Progressing   Problem: Pain Managment: Goal: General experience of comfort will improve and/or be controlled Outcome: Progressing   Problem: Safety: Goal: Ability to remain free from injury will improve Outcome: Progressing   Problem: Skin Integrity: Goal: Risk for impaired skin integrity will decrease Outcome: Progressing

## 2024-03-12 NOTE — Procedures (Signed)
 Received patient in bed to unit.  Alert and oriented.  Informed consent signed and in chart.   Pt access was cleansed and cannulated with 15g needle with no issues.  Pt c/o feeling bad and nausea, BP dropped to 107/66, UF goal reduced to 2.5L, pt given nausea medication.  Pt completed tx stating she is still feeling sick. Needles pulled and site held for 5 mins each. No excessive bleeding. Pt has no signs of distress noted.   TX duration:3.5hrs  Patient tolerated well.  Transported back to the room  Alert, without acute distress.  Hand-off given to patient's nurse.   Access used: LUA AVF Access issues: None  Total UF removed: 2.5L Medication(s) given: Zofran  Post HD weight: 49.4kg Post HD VS: 147/77BP, 86P, 20R, 100O2- 2L Gregory   Gwendolynn Merkey S Gra[facility]

## 2024-03-12 NOTE — Telephone Encounter (Signed)
 Refill request received from [place] for fluconazole.   Upcoming visit: 04/07/24 Recent visit: 02/25/24  Message to A. Ole for determination.

## 2024-03-13 DIAGNOSIS — M86652 Other chronic osteomyelitis, left thigh: Secondary | ICD-10-CM

## 2024-03-13 DIAGNOSIS — N186 End stage renal disease: Secondary | ICD-10-CM | POA: Diagnosis not present

## 2024-03-13 DIAGNOSIS — G40909 Epilepsy, unspecified, not intractable, without status epilepticus: Secondary | ICD-10-CM | POA: Diagnosis not present

## 2024-03-13 DIAGNOSIS — G8220 Paraplegia, unspecified: Secondary | ICD-10-CM | POA: Diagnosis not present

## 2024-03-13 DIAGNOSIS — R7881 Bacteremia: Secondary | ICD-10-CM | POA: Diagnosis not present

## 2024-03-13 LAB — CBC
HCT: 30.4 % — ABNORMAL LOW (ref 36.0–46.0)
Hemoglobin: 9 g/dL — ABNORMAL LOW (ref 12.0–15.0)
MCH: 28.6 pg (ref 26.0–34.0)
MCHC: 29.6 g/dL — ABNORMAL LOW (ref 30.0–36.0)
MCV: 96.5 fL (ref 80.0–100.0)
Platelets: 193 K/uL (ref 150–400)
RBC: 3.15 MIL/uL — ABNORMAL LOW (ref 3.87–5.11)
RDW: 17.1 % — ABNORMAL HIGH (ref 11.5–15.5)
WBC: 6.4 K/uL (ref 4.0–10.5)
nRBC: 0 % (ref 0.0–0.2)

## 2024-03-13 LAB — BASIC METABOLIC PANEL WITH GFR
Anion gap: 12 (ref 5–15)
BUN: 22 mg/dL — ABNORMAL HIGH (ref 6–20)
CO2: 28 mmol/L (ref 22–32)
Calcium: 9.1 mg/dL (ref 8.9–10.3)
Chloride: 98 mmol/L (ref 98–111)
Creatinine, Ser: 3.47 mg/dL — ABNORMAL HIGH (ref 0.44–1.00)
GFR, Estimated: 15 mL/min — ABNORMAL LOW (ref >60)
Glucose, Bld: 85 mg/dL (ref 70–99)
Potassium: 4 mmol/L (ref 3.5–5.1)
Sodium: 137 mmol/L (ref 135–145)

## 2024-03-13 LAB — MAGNESIUM: Magnesium: 2.3 mg/dL (ref 1.7–2.4)

## 2024-03-13 MED ORDER — DARBEPOETIN ALFA 200 MCG/0.4ML IJ SOSY: 200.0000 ug | PREFILLED_SYRINGE | INTRAMUSCULAR | Status: AC

## 2024-03-13 NOTE — Discharge Summary (Signed)
 Physician Discharge Summary   Patient: [name] MRN: [id number] DOB: 09-16-1965  Admit date:     03/09/2024  Discharge date: 03/13/24  Discharge Physician: Eric Nunnery   PCP: Edman Meade PEDLAR, FNP   Recommendations at discharge:  Repeat CBC to follow hemoglobin trend/stability Reassess blood pressure and adjust antihypertensive treatment as needed.  Discharge Diagnoses: Principal Problem:   Bacteremia Active Problems:   Paraplegia (HCC)   Decubitus ulcer of buttock   ESRD on hemodialysis (HCC)   Chronic diastolic CHF (congestive heart failure) (HCC)   Suprapubic catheter (HCC)   Seizure disorder (HCC)   Essential hypertension   TBI (traumatic brain injury) (HCC)   Osteomyelitis of multiple sites-chronic    Pyogenic inflammation of bone (HCC)  Brief hospital admission narrative: As per H&P written by Dr. Pearlean on 03/10/2024 [name] is a 58 y.o. female with medical history significant for ESRD on dialysis, paraplegia, chronic indwelling suprapubic catheter, seizures, diastolic CHF. Patient presented to the ED yesterday with complaints of vomiting and upper abdominal pain of 2 days duration.  Per ED provider notes, she describes stabbing upper abdominal pain that sometimes radiates into her chest.  But she tells me the pain is around her lower rib cage.  She denies any falls, no cough, no unusual physical activity. Her chronic decubitus ulcer is unchanged from baseline.   Recent prolonged hospitalization at [facility]-8/6 to 8/22-presented to [facility] ER then transferred to [facility]- for septic shock requiring pressors, left hip septic arthritis (aspiration by IR was dry )Bacteroides fragilis bacteremia and left femur osteomyelitis.  Was placed on Vanco and Zosyn , but with increasing white count was switched to metronidazole  cefepime  and vancomycin .  Repeat blood cultures were negative.  Underwent resection arthroplasty [place] by  orthopedic and plastic surgery 01/09/2024.  IntraOp cultures negative.  Wound VAC was placed over left hip.  Required 1 unit of blood.  Patient was discharged on IV cefepime  and vancomycin  dosed with HD Monday Wednesday Friday.  Cultures 8/15 also grew fungus, for which she is on fluconazole   ED Course: Stable vitals.  Troponin 86 x 2.  UA with moderate leukocytes.  Chest x-ray with mild pulmonary edema and small bilateral pleural effusions. CTAP Wo C-showed new small pericardial effusion, diffuse wall thickening concerning for proctitis.  Calcified collection adjacent to tip of coccyx increased in size, erosive changes to coccyx unchanged, correlate clinically for abscess.  Changes to left femoral head, new erosive changes to central left acetabulum concerning for cellulitis. Patient boarded in the ED till morning, and got MRI of the left hip without contrast -multiple findings mostly chronic, - chronic osteomyelitis of the left proximal femur, fluid collection lower coccyx-appear to be calcifications.  Deep decubitus ulcer below the right ischium, abnormal muscular edema in left iliac us  gluteus minimus hip abductor and upper thigh musculature suggesting myositis.  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  Septic right hip joint not totally excluded.  But less likely.   After MRI, plan was for patient to be discharged from the ED to go get dialyzed, unfortunately dialysis center [facility] could not plug patient in for today until tomorrow.  Then patient complained of difficulty breathing, hence nephrology was consulted for dialysis, to be discharged after HD.  EDP was called with positive blood cultures-negative rods.  Dr. Luiz recommended hospitalization and IV Rocephin  pending final  speciation and cultures.  Assessment and Plan: Bacteremia, gram positive  rod - Initial cultures thought to be gram-negative rods but later this was updated by micro being [facility].  Confirm with ID and micro team.  This appears to be contaminant, will repeat cultures this morning and discontinue antibiotics.  -Patient's blood cultures remain negative over 48 hours; no signs of infection, elevation in her WBCs, fever or any other complaints prior to discharge without receiving antibiotics. -At [facility] 12/[facility]23 grew bacteroides fragilis, completed 6 weeks of IV antibiotics on 9/25.   Upper abdominal pain -CT scan is suggestive of proctitis otherwise overall unremarkable.  LFTs and lipase are normal. -Continue supportive care and monitoring. - No significant abdominal discomfort reported at discharge.    Chronic osteomyelitis/chronic decubitus ulcer-  per patient also is at baseline.  CTAP Wo C-showed new small pericardial effusion, diffuse wall thickening concerning for proctitis.  Calcified collection adjacent to tip of coccyx increased in size, erosive changes to coccyx unchanged, correlate clinically for abscess.  Changes to left femoral head, new erosive changes to central left acetabulum concerning for cellulitis. 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  Deep decubitus ulcer below the right ischium, abnormal muscular edema in left iliac us  gluteus minimus hip abductor and upper thigh musculature suggesting myositis.  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  Septic right hip joint not totally excluded.  But less likely at the moment. - Continue outpatient follow-up..   ESRD/Chronic diastolic CHF -Status post HD 10/15, appreciate assistance and recommendation by nephrology service  regarding optimization for further volume status and HD needs..   - Regular schedule schedule Monday Wednesday Friday.  - Patient appears to be stable and euvolemic.  -Most recent echo echo 03/2023 EF 45 to 50% grade 2 DD. -Continue volume management with dialysis. -Next hemodialysis management on 03/15/2024 as per outpatient schedule.   Seizure disorder - On Keppra , - No seizure activity appreciated. -Continue outpatient follow-up with neurology service as previously instructed.   Hypertension-  -Continue to follow vital signs. - Continue treatment with Norvasc , carvedilol , and valsartan - Low-sodium diet intake discussed with patient.     Consultants: Infectious disease curbside; nephrology service. Procedures performed: See below for x-ray reports. Disposition: Home with resumption of prior to admission home health services Diet recommendation: Heart healthy/low-sodium diet.  DISCHARGE MEDICATION: Allergies as of 03/13/2024       Reactions   Other Anaphylaxis   Dialyzer   Cefepime  Other (See Comments)   Severe AMS 09/2023 admission   Linezolid Other (See Comments)   Patient self-discontinued treatment due to GI intolerance. Taking it along with moxifloxacin   Moxifloxacin Other (See Comments)   Patient self-discontinued treatment due to GI intolerance. Taking it along with linezolid   Quinine Derivatives Other (See Comments)   Alters mental status   Vancomycin  Other (See Comments)   Unknown  Pt is tolerating this medication at HD   Azithromycin  Itching, Rash   Benadryl [diphenhydramine Hcl (sleep)] Hives   Daptomycin Hives   Piperacillin  Dermatitis   Tetracycline Itching   Able to tolerate Doxycycline .    Zosyn  [piperacillin  Sod-tazobactam So] Rash   Tolerated in 12/2023 admission        Medication List     STOP taking these  medications    cefTRIAXone  2 g in sodium chloride  0.9 % 100 mL   cephALEXin  500 MG capsule Commonly known as: KEFLEX    furosemide   80 MG tablet Commonly known as: LASIX    predniSONE  20 MG tablet Commonly known as: DELTASONE        TAKE these medications    acetaminophen  500 MG tablet Commonly known as: TYLENOL  Take 1,000 mg by mouth every 6 (six) hours as needed for moderate pain.   albuterol  (2.5 MG/3ML) 0.083% nebulizer solution Commonly known as: PROVENTIL  Take 3 mLs (2.5 mg total) by nebulization every 4 (four) hours as needed for wheezing or shortness of breath.   amLODipine  5 MG tablet Commonly known as: NORVASC  Take 5 mg by mouth every evening.   carvedilol  25 MG tablet Commonly known as: COREG  Take 25 mg by mouth 2 (two) times daily.   cyclobenzaprine  10 MG tablet Commonly known as: FLEXERIL  Take 10 mg by mouth 3 (three) times daily as needed.   Darbepoetin Alfa  200 MCG/0.4ML Sosy injection Commonly known as: ARANESP  Inject 0.4 mLs (200 mcg total) into the skin every Monday at 6 PM. Start taking on: March 15, 2024   DIALYVITE TABLET Tabs Take 1 tablet by mouth daily.   fluconazole 100 MG tablet Commonly known as: DIFLUCAN Take 200 mg by mouth daily.   gabapentin  100 MG capsule Commonly known as: NEURONTIN  Take 1 capsule (100 mg total) by mouth 2 (two) times daily.   hydrocortisone  2.5 % cream Apply 1 Application topically 2 (two) times daily.   hydrOXYzine  10 MG tablet Commonly known as: ATARAX  Take 10 mg by mouth every 8 (eight) hours as needed for itching.   irbesartan  150 MG tablet Commonly known as: AVAPRO  Take 150 mg by mouth every evening.   levETIRAcetam  1000 MG tablet Commonly known as: KEPPRA  Take 1,000 mg by mouth at bedtime.   lidocaine -prilocaine  cream Commonly known as: EMLA  Apply 1 Application topically 3 (three) times a week.   ondansetron  4 MG disintegrating tablet Commonly known as: ZOFRAN -ODT 4mg  ODT q4 hours prn nausea/vomit   ondansetron  4 MG tablet Commonly known as: ZOFRAN  Take 1 tablet (4 mg total) by mouth every 6 (six) hours.    pantoprazole  20 MG tablet Commonly known as: PROTONIX  Take 1 tablet (20 mg total) by mouth daily. What changed:  medication strength how much to take   senna-docusate 8.6-50 MG tablet Commonly known as: Senokot-S Take 2 tablets by mouth at bedtime.        Follow-up Information     Bacchus, Meade PEDLAR, FNP. Schedule an appointment as soon as possible for a visit in 10 day(s).   Specialty: Family Medicine Contact information: [address] [place] [address] [phone]                Discharge Exam: Fredricka Weights   03/12/24 0808 03/12/24 1200  Weight: 51.3 kg 49.4 kg   General exam: Alert, awake, oriented x 3; in no major distress and feeling ready for discharge.  Patient denies any chest pain, nausea, vomiting or shortness of breath. Respiratory system: Good air movement bilaterally.  No using accessory muscles. Cardiovascular system: Rate controlled, no rubs, no gallops, no JVD. Gastrointestinal system: Abdomen is nondistended, soft and nontender. No organomegaly or masses felt. Normal bowel sounds heard. Central nervous system: Generally weak.  No focal neurological deficits. Extremities: No cyanosis or clubbing. Skin: No petechiae. Psychiatry: Judgement and insight appear normal.  Flat affect appreciated on exam.  Condition at  discharge: Stable and improved.  The results of significant diagnostics from this hospitalization (including imaging, microbiology, ancillary and laboratory) are listed below for reference.   Imaging Studies: MR HIP LEFT WO CONTRAST Result Date: 03/10/2024 CLINICAL DATA:  Osteomyelitis EXAM: MR OF THE LEFT HIP WITHOUT CONTRAST TECHNIQUE: Multiplanar, multisequence MR imaging was performed. No intravenous contrast was administered. COMPARISON:  CT scan 03/09/2024 FINDINGS: Bones and joints: Chronic osteomyelitis of the left proximal femur with complete bony destruction of the femoral head and intertrochanteric region. The remaining  shaft of the femur is displaced about 6 cm laterally from the acetabulum and has internal marrow edema compatible with osteomyelitis, along with a posterior cutaneous and subcutaneous ulceration extending to towards and possibly into the remainder of the left hip joint. Edema and scalloping in the acetabulum likely reflecting chronic infection. The native right hip is chronically posteriorly displaced along the posterior lip of the acetabulum, with a large joint effusion, flattening and scalloping of the acetabulum, and flattening of portions of the femoral head favoring components of avascular necrosis. Septic right hip joint is not totally excluded although is less likely given the lack of surrounding marrow edema in the acetabulum and right proximal femur. Bony destructive findings of the lower coccyx, with the space occupied by a 4.2 by 5.1 by 2.4 cm collection of fluid and possibly dependent calcifications as on image 27 series 16. This collection appear to have calcifications along its margins as well especially posteriorly. Deep decubitus ulcer below the right ischium and extends to the thinned inferior pubic ramus region. Muscles and tendons Muscles and tendons: Abnormal muscular edema in the left iliacus, gluteus minimus, hip adductor, and upper thigh musculature suggesting myositis. Other findings Miscellaneous: Regional muscular atrophy. Suprapubic catheter noted Subcutaneous edema along the left perineum left upper thigh region and along the right posterolateral buttock region. IMPRESSION: 1. Chronic osteomyelitis of the left proximal femur with complete bony destruction of the femoral head and intertrochanteric region. The remaining shaft of the femur is displaced about 6 cm laterally from the acetabulum and has internal marrow edema compatible with osteomyelitis, along with a posterior cutaneous and subcutaneous decubitus ulceration extending to towards and possibly into the remainder of the left hip  joint. 2. Bony destructive findings of the lower coccyx, with the space occupied by a 4.2 by 5.1 by 2.4 cm collection of fluid and possibly dependent calcifications. This collection appear to have calcifications along its margins as well especially posteriorly. 3. Deep decubitus ulcer below the right ischium and extends to the thinned inferior pubic ramus region. 4. Abnormal muscular edema in the left iliacus, gluteus minimus, hip adductor, and upper thigh musculature suggesting myositis. 5. 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 Septic right hip joint is not totally excluded although is less likely given the lack of surrounding marrow edema in the acetabulum and right proximal femur. Electronically Signed   By: Ryan Salvage M.D.   On: 03/10/2024 11:00   CT ABDOMEN PELVIS WO CONTRAST Result Date: 03/09/2024 EXAM: CT ABDOMEN AND PELVIS WITHOUT CONTRAST 03/09/2024 07:53:25 PM TECHNIQUE: CT of the abdomen and pelvis was performed without the administration of intravenous contrast. Multiplanar reformatted images are provided for review. Automated exposure control, iterative reconstruction, and/or weight-based adjustment of the mA/kV was utilized to reduce the radiation dose to as low as reasonably achievable. COMPARISON: CT abdomen  and pelvis 12/31/2023. CLINICAL HISTORY: Abdominal pain, acute, nonlocalized. Pt arrived via [facility] from home c/o N/V, abdominal pain X 2 days. Pt has LUE restriction for dialysis and last received dialysis yesterday. Per [facility], Pt is also hypertensive with a BP 188/95. FINDINGS: LOWER CHEST: The heart is enlarged with a new small pericardial effusion. There is a stable small right pleural effusion with atelectasis. No acute abnormality. LIVER: The liver is unremarkable. GALLBLADDER AND BILE DUCTS: Gallbladder  is unremarkable. No biliary ductal dilatation. SPLEEN: No acute abnormality. PANCREAS: No acute abnormality. ADRENAL GLANDS: No acute abnormality. KIDNEYS, URETERS AND BLADDER: Bilateral renal cysts are stable. No stones in the kidneys or ureters. No hydronephrosis. No perinephric or periureteral stranding. Urinary bladder is unremarkable. GI AND BOWEL: Stomach demonstrates no acute abnormality. There is no bowel obstruction. There is diffuse rectal wall thickening, particularly posteriorly. The appendix appears normal. PERITONEUM AND RETROPERITONEUM: No ascites. No free air. VASCULATURE: Aorta is normal in caliber. LYMPH NODES: No lymphadenopathy. REPRODUCTIVE ORGANS: No acute abnormality. BONES AND SOFT TISSUES: chronic posterior right hip dislocation and joint effusion are again noted. There has been interval erosive change or sur- surgical amputation of the left femoral head. There are increasing soft tissue calcifications surrounding the proximal left femur. Um increasing erosive changes involving the central acetabulum. There is increased soft tissue density throughout the acetabulum and in this region with deep posterior decubitus ulceration. No significant joint effusion or soft tissue gas allowing for lack of intravenous contrast. Thoracic patient hardware again noted. Decubitus ulceration is seen at this level similar to the prior study. Joint effusion persists, but soft tissue air is no longer identified in the left hip. Peripherally calcified oval collection is seen abutting the tip of the coccyx measuring 3.9x2.8x4.7 cm. This has increased in size and appears more well defined. There is surrounding inflammatory stranding in this region. Is about the underlying tip of the coccyx where there are some erosive changes similar to prior. IMPRESSION: 1. New small pericardial effusion. 2. Diffuse rectal wall thickening concerning for proctitis. 3. No acute findings. Oval peripherally calcified collection  adjacent to the tip of the coccyx which has increased in size. Underlying erosive changes of the coccyx appear unchanged. Correlate clinically for abscess. 4. Interval amputation or erosive changes of the left femoral head. There are new erosive changes of the central left acetabulum concerning for osteomyelitis. Posterior left hip decubitus ulcer persists. Electronically signed by: Greig Pique MD 03/09/2024 08:12 PM EDT RP Workstation: [id number]   DG Chest 1 View Result Date: 03/09/2024 CLINICAL DATA:  [id number] SOB (shortness of breath) [id number] EXAM: CHEST  1 VIEW COMPARISON:  03/05/2024 FINDINGS: Lower lung volumes. Bilateral perihilar interstitial opacities. Hazy airspace opacities in both lung bases. Blunting of the costophrenic sulci. No pneumothorax. Mild cardiomegaly. Tortuous aorta with aortic atherosclerosis. No acute fracture or destructive lesions. Bilateral fusion rods again noted. Resection of the proximal right clavicle. IMPRESSION: Cardiomegaly with mild pulmonary edema. Small bilateral pleural effusions. Electronically Signed   By: Rogelia Myers M.D.   On: 03/09/2024 15:10   DG Chest Portable 1 View Result Date: 03/05/2024 CLINICAL DATA:  Headache. EXAM: PORTABLE CHEST 1 VIEW COMPARISON:  December 31, 2023 FINDINGS: The cardiac silhouette is enlarged and unchanged in size. Mild atelectatic changes are seen within the right lung base. There is a small, stable right pleural effusion versus pleural thickening. No pneumothorax is identified. A chronic deformity of the right clavicle is present. Postoperative changes are seen throughout the thoracic spine. IMPRESSION:  1. Mild right basilar atelectasis. 2. Small, stable right pleural effusion versus pleural thickening. Electronically Signed   By: Suzen Dials M.D.   On: 03/05/2024 12:22    Microbiology: Results for orders placed or performed during the hospital encounter of 03/09/24  Urine Culture     Status: Abnormal   Collection Time:  03/09/24  3:45 PM   Specimen: Urine, Clean Catch  Result Value Ref Range Status   Specimen Description   Final    URINE, CLEAN CATCH Performed at [facility], [address], [place], [place] [address]    Special Requests   Final    NONE Performed at [facility], [address], [place], [place] [address]    Culture (A)  Final    >=100,000 COLONIES/mL MULTIPLE SPECIES PRESENT, SUGGEST RECOLLECTION   Report Status 03/11/2024 FINAL  Final  Blood culture (routine x 2)     Status: Abnormal   Collection Time: 03/09/24  8:35 PM   Specimen: BLOOD  Result Value Ref Range Status   Specimen Description   Final    BLOOD RIGHT ANTECUBITAL Performed at [facility], [address], [place], [place] [address]    Special Requests   Final    BOTTLES DRAWN AEROBIC AND ANAEROBIC Blood Culture adequate volume Performed at [facility], [address], [place], [place] [address]    Culture  Setup Time   Final    BOTTLES DRAWN AEROBIC AND ANAEROBIC GRAM POSITIVE COCCI Gram Stain Report Called to,Read Back By and Verified With: CHRISTELLA BREEN RN [id number] K FORSYTH  Organism ID to follow PREVIOUSLY REPORTED AS: GRAM NEGATIVE RODS CORRECTED RESULTS CALLED TO: CRITICAL RESULT CALLED TO, READ BACK BY AND VERIFIED WITH: MINDY MARSH, RN 03/10/2024 1939 PR    Culture (A)  Final    STAPHYLOCOCCUS EPIDERMIDIS STAPHYLOCOCCUS HOMINIS THE SIGNIFICANCE OF ISOLATING THIS ORGANISM FROM A SINGLE SET OF BLOOD CULTURES WHEN MULTIPLE SETS ARE DRAWN IS UNCERTAIN. PLEASE NOTIFY THE MICROBIOLOGY DEPARTMENT WITHIN ONE WEEK IF SPECIATION AND SENSITIVITIES ARE REQUIRED. Performed at [facility], [address], [place], [place] [address]    Report Status 03/12/2024 FINAL  Final  Blood Culture ID Panel (Reflexed)     Status: Abnormal   Collection Time: 03/09/24  8:35 PM  Result Value Ref Range Status   Enterococcus faecalis NOT DETECTED NOT DETECTED Final   Enterococcus Faecium NOT DETECTED NOT DETECTED Final    Listeria monocytogenes NOT DETECTED NOT DETECTED Final   Staphylococcus species DETECTED (A) NOT DETECTED Final    Comment: CRITICAL RESULT CALLED TO, READ BACK BY AND VERIFIED WITH: MINDY MARSH, RN 03/10/2024 1939 PR    Staphylococcus aureus (BCID) NOT DETECTED NOT DETECTED Final   Staphylococcus epidermidis DETECTED (A) NOT DETECTED Final    Comment: Methicillin (oxacillin) resistant coagulase negative staphylococcus. Possible blood culture contaminant (unless isolated from more than one blood culture draw or clinical case suggests pathogenicity). No antibiotic treatment is indicated for blood  culture contaminants. CRITICAL RESULT CALLED TO, READ BACK BY AND VERIFIED WITH: MINDY MARSH, RN 03/10/2024 1939 PR    Staphylococcus lugdunensis NOT DETECTED NOT DETECTED Final   Streptococcus species NOT DETECTED NOT DETECTED Final   Streptococcus agalactiae NOT DETECTED NOT DETECTED Final   Streptococcus pneumoniae NOT DETECTED NOT DETECTED Final   Streptococcus pyogenes NOT DETECTED NOT DETECTED Final   A.calcoaceticus-baumannii NOT DETECTED NOT DETECTED Final   Bacteroides fragilis NOT DETECTED NOT DETECTED Final   Enterobacterales NOT DETECTED NOT DETECTED Final  Enterobacter cloacae complex NOT DETECTED NOT DETECTED Final   Escherichia coli NOT DETECTED NOT DETECTED Final   Klebsiella aerogenes NOT DETECTED NOT DETECTED Final   Klebsiella oxytoca NOT DETECTED NOT DETECTED Final   Klebsiella pneumoniae NOT DETECTED NOT DETECTED Final   Proteus species NOT DETECTED NOT DETECTED Final   Salmonella species NOT DETECTED NOT DETECTED Final   Serratia marcescens NOT DETECTED NOT DETECTED Final   Haemophilus influenzae NOT DETECTED NOT DETECTED Final   Neisseria meningitidis NOT DETECTED NOT DETECTED Final   Pseudomonas aeruginosa NOT DETECTED NOT DETECTED Final   Stenotrophomonas maltophilia NOT DETECTED NOT DETECTED Final   Candida albicans NOT DETECTED NOT DETECTED Final   Candida auris  NOT DETECTED NOT DETECTED Final   Candida glabrata NOT DETECTED NOT DETECTED Final   Candida krusei NOT DETECTED NOT DETECTED Final   Candida parapsilosis NOT DETECTED NOT DETECTED Final   Candida tropicalis NOT DETECTED NOT DETECTED Final   Cryptococcus neoformans/gattii NOT DETECTED NOT DETECTED Final   Methicillin resistance mecA/C DETECTED (A) NOT DETECTED Final    Comment: CRITICAL RESULT CALLED TO, READ BACK BY AND VERIFIED WITH: GRAEME FERRIES, RN 03/10/2024 1939 PR Performed at [facility], [address], [place], [place] [address]   Blood culture (routine x 2)     Status: None (Preliminary result)   Collection Time: 03/09/24  9:02 PM   Specimen: BLOOD  Result Value Ref Range Status   Specimen Description BLOOD BLOOD RIGHT HAND  Final   Special Requests   Final    BOTTLES DRAWN AEROBIC AND ANAEROBIC Blood Culture adequate volume   Culture   Final    NO GROWTH 4 DAYS Performed at [facility], [address], [place], [place] [address]    Report Status PENDING  Incomplete  Culture, blood (Routine X 2) w Reflex to ID Panel     Status: None (Preliminary result)   Collection Time: 03/11/24  9:15 AM   Specimen: BLOOD  Result Value Ref Range Status   Specimen Description BLOOD BLOOD RIGHT ARM  Final   Special Requests   Final    BOTTLES DRAWN AEROBIC ONLY Blood Culture adequate volume   Culture   Final    NO GROWTH 2 DAYS Performed at [facility], [address], [place], [place] [address]    Report Status PENDING  Incomplete  Culture, blood (Routine X 2) w Reflex to ID Panel     Status: None (Preliminary result)   Collection Time: 03/11/24  9:19 AM   Specimen: BLOOD  Result Value Ref Range Status   Specimen Description BLOOD BLOOD RIGHT ARM  Final   Special Requests   Final    BOTTLES DRAWN AEROBIC ONLY Blood Culture adequate volume   Culture   Final    NO GROWTH 2 DAYS Performed at [facility], [address], [place], [place] [address]    Report Status PENDING   Incomplete    Labs: CBC: Recent Labs  Lab 03/09/24 1421 03/11/24 0508 03/12/24 0437 03/13/24 0420  WBC 7.6 6.9 7.0 6.4  NEUTROABS 5.9  --   --   --   HGB 9.5* 8.9* 8.8* 9.0*  HCT 32.1* 30.2* 29.7* 30.4*  MCV 97.9 96.2 95.2 96.5  PLT 204 184 174 193   Basic Metabolic Panel: Recent Labs  Lab 03/09/24 1421 03/11/24 0508 03/12/24 0437 03/13/24 0420  NA 138 138 138 137  K 4.3 3.6 4.3 4.0  CL 98 98 98 98  CO2 29  29 26 28   GLUCOSE 80 88 88 85  BUN 20 19 34* 22*  CREATININE 3.40* 3.17* 5.01* 3.47*  CALCIUM  9.6 9.2 8.7* 9.1  MG  --   --  2.3 2.3  PHOS  --   --  7.5*  --    Liver Function Tests: Recent Labs  Lab 03/09/24 1421  AST 18  ALT 9  ALKPHOS 78  BILITOT 0.4  PROT 6.9  ALBUMIN  3.3*   CBG: No results for input(s): GLUCAP in the last 168 hours.  Discharge time spent:  35 minutes.  Signed: Eric Nunnery, MD [facility] 03/13/2024

## 2024-03-13 NOTE — Plan of Care (Signed)
  Problem: Education: Goal: Knowledge of General Education information will improve Description: Including pain rating scale, medication(s)/side effects and non-pharmacologic comfort measures Outcome: Progressing   Problem: Clinical Measurements: Goal: Diagnostic test results will improve Outcome: Progressing   Problem: Activity: Goal: Risk for activity intolerance will decrease Outcome: Progressing   Problem: Coping: Goal: Level of anxiety will decrease Outcome: Progressing   Problem: Pain Managment: Goal: General experience of comfort will improve and/or be controlled Outcome: Progressing

## 2024-03-13 NOTE — TOC Transition Note (Signed)
 Transition of Care [facility]) - Discharge Note   Patient Details  Name: [name] MRN: [id number] Date of Birth: 1965-12-22  Transition of [facility]) CM/SW Contact:  Lucie Lunger, LCSWA Phone Number: 03/13/2024, 9:54 AM   Clinical Narrative:    CSW updated that pt will likely D/C home today. CSW notes HH orders already placed by MD. CSW updated Darice with [facility] that plan is for D/C home today, they will follow pt in community. TOC signing off.   Final next level of care: Home w Home Health Services Barriers to Discharge: Barriers Resolved   Patient Goals and CMS Choice Patient states their goals for this hospitalization and ongoing recovery are:: return home CMS Medicare.gov Compare Post Acute Care list provided to:: Patient Choice offered to / list presented to : Patient [place] ownership interest in [facility].provided to::  (NA)    Discharge Placement                       Discharge Plan and Services Additional resources added to the After Visit Summary for   In-house Referral: Clinical Social Work Discharge Planning Services: NA Post Acute Care Choice: Resumption of Svcs/PTA Provider, Home Health          DME Arranged: N/A DME Agency: NA       HH Arranged: RN HH Agency: [facility] Date HH Agency Contacted: 03/13/24   Representative spoke with at [facility] Agency: Darice  Social Drivers of Health (SDOH) Interventions SDOH Screenings   Food Insecurity: No Food Insecurity (03/11/2024)  Housing: Unknown (03/11/2024)  Transportation Needs: No Transportation Needs (03/11/2024)  Utilities: Not At Risk (03/11/2024)  Depression (PHQ2-9): Low Risk  (11/20/2023)  Recent Concern: Depression (PHQ2-9) - High Risk (10/07/2023)  Tobacco Use: Low Risk  (03/09/2024)     Readmission Risk Interventions    03/11/2024   10:32 AM 09/22/2023    8:17 PM 07/11/2023    8:38 AM  Readmission Risk Prevention Plan  Transportation Screening  Complete Complete Complete  Medication Review Oceanographer) Complete Complete Complete  PCP or Specialist appointment within 3-5 days of discharge Complete Complete   HRI or Home Care Consult Complete Complete Complete  SW Recovery Care/Counseling Consult Complete Complete Complete  Palliative Care Screening Not Applicable Not Applicable Not Applicable  Skilled Nursing Facility Not Applicable Not Applicable Not Applicable

## 2024-03-14 LAB — CULTURE, BLOOD (ROUTINE X 2)
Culture: NO GROWTH
Special Requests: ADEQUATE

## 2024-03-15 NOTE — Progress Notes (Signed)
 Late note entry 10/20 0913 D/c noted, contacted out-pt HD clinic, [facility], to inform of pt d/c and anticipated arrival back this afternoon, d/c summ and last neph note faxed over. No further support needed at this time.   [facility] [phone]

## 2024-03-16 LAB — CULTURE, BLOOD (ROUTINE X 2)
Culture: NO GROWTH
Culture: NO GROWTH
Special Requests: ADEQUATE
Special Requests: ADEQUATE

## 2024-03-17 ENCOUNTER — Other Ambulatory Visit: Payer: Self-pay | Admitting: Family Medicine

## 2024-03-17 ENCOUNTER — Telehealth: Admitting: Internal Medicine

## 2024-03-17 ENCOUNTER — Encounter: Payer: Self-pay | Admitting: Internal Medicine

## 2024-03-17 DIAGNOSIS — R7881 Bacteremia: Secondary | ICD-10-CM | POA: Diagnosis not present

## 2024-03-17 DIAGNOSIS — R112 Nausea with vomiting, unspecified: Secondary | ICD-10-CM | POA: Diagnosis not present

## 2024-03-17 DIAGNOSIS — S71002D Unspecified open wound, left hip, subsequent encounter: Secondary | ICD-10-CM

## 2024-03-17 DIAGNOSIS — S71002A Unspecified open wound, left hip, initial encounter: Secondary | ICD-10-CM | POA: Insufficient documentation

## 2024-03-17 DIAGNOSIS — M86652 Other chronic osteomyelitis, left thigh: Secondary | ICD-10-CM | POA: Diagnosis not present

## 2024-03-17 DIAGNOSIS — Z09 Encounter for follow-up examination after completed treatment for conditions other than malignant neoplasm: Secondary | ICD-10-CM

## 2024-03-17 MED ORDER — GABAPENTIN 100 MG PO CAPS: 100.0000 mg | ORAL_CAPSULE | Freq: Two times a day (BID) | ORAL | 2 refills | Status: DC

## 2024-03-17 NOTE — Patient Instructions (Addendum)
 Please continue taking gabapentin  as prescribed for pain.  Please follow-up with Infectious Disease clinic and Wound Care clinic as scheduled.

## 2024-03-17 NOTE — Assessment & Plan Note (Signed)
 Improved with Zofran  Continue pantoprazole  for GERD

## 2024-03-17 NOTE — Assessment & Plan Note (Addendum)
 Hospital chart reviewed, including discharge summary She states that her HD clinic has stopped some medicines recently, but does not have details currently - advised to bring medicines in the next visit

## 2024-03-17 NOTE — Assessment & Plan Note (Signed)
 Followed by ID clinic at [facility] Has completed Keflex  for 6 weeks Did not tolerate vancomycin  and cefepime  with HD Has allergy to multiple different antibiotics Denies fever or chills since recent hospitalization

## 2024-03-17 NOTE — Assessment & Plan Note (Signed)
 Patient is s/p Girdlestone procedure on 8/15, however patient still has wound of the left hip with deep tunneling that is difficult to close due to the shape of it. Wound care is considering reopening the wound to allow for better packing. Followed by wound care clinic at [facility]

## 2024-03-17 NOTE — Assessment & Plan Note (Signed)
 Blood culture initially showed gram-negative rod, but later updated by microbiology being GPC, likely contaminant Repeat blood culture was negative Currently afebrile Was given IV Rocephin  during hospitalization, was discharged without any antibiotic
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
## 2024-03-29 DIAGNOSIS — M00852 Arthritis due to other bacteria, left hip: Secondary | ICD-10-CM | POA: Diagnosis not present

## 2024-03-29 DIAGNOSIS — Z466 Encounter for fitting and adjustment of urinary device: Secondary | ICD-10-CM

## 2024-03-29 DIAGNOSIS — G8220 Paraplegia, unspecified: Secondary | ICD-10-CM

## 2024-03-29 DIAGNOSIS — L89890 Pressure ulcer of other site, unstageable: Secondary | ICD-10-CM

## 2024-03-29 DIAGNOSIS — I132 Hypertensive heart and chronic kidney disease with heart failure and with stage 5 chronic kidney disease, or end stage renal disease: Secondary | ICD-10-CM

## 2024-03-29 DIAGNOSIS — Z435 Encounter for attention to cystostomy: Secondary | ICD-10-CM

## 2024-03-29 DIAGNOSIS — N319 Neuromuscular dysfunction of bladder, unspecified: Secondary | ICD-10-CM

## 2024-03-29 DIAGNOSIS — M86352 Chronic multifocal osteomyelitis, left femur: Secondary | ICD-10-CM | POA: Diagnosis not present

## 2024-03-29 DIAGNOSIS — L89224 Pressure ulcer of left hip, stage 4: Secondary | ICD-10-CM | POA: Diagnosis not present

## 2024-03-29 DIAGNOSIS — S24103S Unspecified injury at T7-T10 level of thoracic spinal cord, sequela: Secondary | ICD-10-CM

## 2024-03-29 DIAGNOSIS — L89314 Pressure ulcer of right buttock, stage 4: Secondary | ICD-10-CM

## 2024-03-29 DIAGNOSIS — B966 Bacteroides fragilis [B. fragilis] as the cause of diseases classified elsewhere: Secondary | ICD-10-CM | POA: Diagnosis not present

## 2024-04-01 ENCOUNTER — Ambulatory Visit: Admitting: Family Medicine

## 2024-04-09 NOTE — Telephone Encounter (Signed)
 No appointment has been scheduled.   Placed call to [facility] wound care and left detailed voicemail requesting return call to coordinate follow up appointments.

## 2024-04-12 ENCOUNTER — Emergency Department (HOSPITAL_COMMUNITY)
Admission: EM | Admit: 2024-04-12 | Discharge: 2024-04-13 | Disposition: A | Discharge status: Other Acute Inpatient Hospital | Attending: Emergency Medicine | Admitting: Emergency Medicine

## 2024-04-12 ENCOUNTER — Emergency Department (HOSPITAL_COMMUNITY)

## 2024-04-12 ENCOUNTER — Other Ambulatory Visit: Payer: Self-pay

## 2024-04-12 ENCOUNTER — Encounter (HOSPITAL_COMMUNITY): Payer: Self-pay | Admitting: Emergency Medicine

## 2024-04-12 DIAGNOSIS — R509 Fever, unspecified: Secondary | ICD-10-CM | POA: Diagnosis present

## 2024-04-12 DIAGNOSIS — N186 End stage renal disease: Secondary | ICD-10-CM | POA: Insufficient documentation

## 2024-04-12 DIAGNOSIS — E875 Hyperkalemia: Secondary | ICD-10-CM | POA: Diagnosis not present

## 2024-04-12 DIAGNOSIS — M25451 Effusion, right hip: Secondary | ICD-10-CM | POA: Insufficient documentation

## 2024-04-12 DIAGNOSIS — Z992 Dependence on renal dialysis: Secondary | ICD-10-CM | POA: Diagnosis not present

## 2024-04-12 DIAGNOSIS — Z79899 Other long term (current) drug therapy: Secondary | ICD-10-CM | POA: Diagnosis not present

## 2024-04-12 DIAGNOSIS — A419 Sepsis, unspecified organism: Secondary | ICD-10-CM | POA: Diagnosis not present

## 2024-04-12 DIAGNOSIS — I509 Heart failure, unspecified: Secondary | ICD-10-CM | POA: Diagnosis not present

## 2024-04-12 DIAGNOSIS — I132 Hypertensive heart and chronic kidney disease with heart failure and with stage 5 chronic kidney disease, or end stage renal disease: Secondary | ICD-10-CM | POA: Diagnosis not present

## 2024-04-12 LAB — URINALYSIS, W/ REFLEX TO CULTURE (INFECTION SUSPECTED)
Bilirubin Urine: NEGATIVE
Glucose, UA: 50 mg/dL — AB
Ketones, ur: NEGATIVE mg/dL
Nitrite: NEGATIVE
Protein, ur: >=300 mg/dL — AB
RBC / HPF: >50 RBC/hpf (ref 0–5)
Specific Gravity, Urine: 1.015 (ref 1.005–1.030)
WBC, UA: >50 WBC/hpf (ref 0–5)
pH: 8 (ref 5.0–8.0)

## 2024-04-12 LAB — CBC WITH DIFFERENTIAL/PLATELET
Abs Immature Granulocytes: 0.05 K/uL (ref 0.00–0.07)
Basophils Absolute: 0.1 K/uL (ref 0.0–0.1)
Basophils Relative: 1 %
Eosinophils Absolute: 0 K/uL (ref 0.0–0.5)
Eosinophils Relative: 0 %
HCT: 35.2 % — ABNORMAL LOW (ref 36.0–46.0)
Hemoglobin: 10.4 g/dL — ABNORMAL LOW (ref 12.0–15.0)
Immature Granulocytes: 0 %
Lymphocytes Relative: 10 %
Lymphs Abs: 1.2 K/uL (ref 0.7–4.0)
MCH: 27.8 pg (ref 26.0–34.0)
MCHC: 29.5 g/dL — ABNORMAL LOW (ref 30.0–36.0)
MCV: 94.1 fL (ref 80.0–100.0)
Monocytes Absolute: 0.5 K/uL (ref 0.1–1.0)
Monocytes Relative: 4 %
Neutro Abs: 10.7 K/uL — ABNORMAL HIGH (ref 1.7–7.7)
Neutrophils Relative %: 85 %
Platelets: 177 K/uL (ref 150–400)
RBC: 3.74 MIL/uL — ABNORMAL LOW (ref 3.87–5.11)
RDW: 18.2 % — ABNORMAL HIGH (ref 11.5–15.5)
WBC: 12.5 K/uL — ABNORMAL HIGH (ref 4.0–10.5)
nRBC: 0 % (ref 0.0–0.2)

## 2024-04-12 LAB — I-STAT CHEM 8, ED
BUN: 88 mg/dL — ABNORMAL HIGH (ref 6–20)
Calcium, Ion: 1.14 mmol/L — ABNORMAL LOW (ref 1.15–1.40)
Chloride: 103 mmol/L (ref 98–111)
Creatinine, Ser: 7.9 mg/dL — ABNORMAL HIGH (ref 0.44–1.00)
Glucose, Bld: 101 mg/dL — ABNORMAL HIGH (ref 70–99)
HCT: 34 % — ABNORMAL LOW (ref 36.0–46.0)
Hemoglobin: 11.6 g/dL — ABNORMAL LOW (ref 12.0–15.0)
Potassium: 6.1 mmol/L — ABNORMAL HIGH (ref 3.5–5.1)
Sodium: 134 mmol/L — ABNORMAL LOW (ref 135–145)
TCO2: 20 mmol/L — ABNORMAL LOW (ref 22–32)

## 2024-04-12 LAB — RESP PANEL BY RT-PCR (RSV, FLU A&B, COVID)  RVPGX2
Influenza A by PCR: NEGATIVE
Influenza B by PCR: NEGATIVE
Resp Syncytial Virus by PCR: NEGATIVE
SARS Coronavirus 2 by RT PCR: NEGATIVE

## 2024-04-12 LAB — BLOOD GAS, VENOUS
Acid-base deficit: 7.4 mmol/L — ABNORMAL HIGH (ref 0.0–2.0)
Bicarbonate: 19.3 mmol/L — ABNORMAL LOW (ref 20.0–28.0)
Drawn by: 1391
O2 Saturation: 62.6 %
Patient temperature: 39.6
pCO2, Ven: 48 mmHg (ref 44–60)
pH, Ven: 7.22 — ABNORMAL LOW (ref 7.25–7.43)
pO2, Ven: 50 mmHg — ABNORMAL HIGH (ref 32–45)

## 2024-04-12 LAB — PROTIME-INR
INR: 1.3 — ABNORMAL HIGH (ref 0.8–1.2)
Prothrombin Time: 17.1 s — ABNORMAL HIGH (ref 11.4–15.2)

## 2024-04-12 LAB — COMPREHENSIVE METABOLIC PANEL WITH GFR
ALT: 12 U/L (ref 0–44)
AST: 23 U/L (ref 15–41)
Albumin: 3.6 g/dL (ref 3.5–5.0)
Alkaline Phosphatase: 76 U/L (ref 38–126)
Anion gap: 19 — ABNORMAL HIGH (ref 5–15)
BUN: 76 mg/dL — ABNORMAL HIGH (ref 6–20)
CO2: 20 mmol/L — ABNORMAL LOW (ref 22–32)
Calcium: 9.2 mg/dL (ref 8.9–10.3)
Chloride: 96 mmol/L — ABNORMAL LOW (ref 98–111)
Creatinine, Ser: 7.38 mg/dL — ABNORMAL HIGH (ref 0.44–1.00)
GFR, Estimated: 6 mL/min — ABNORMAL LOW (ref >60)
Glucose, Bld: 95 mg/dL (ref 70–99)
Potassium: 6.2 mmol/L — ABNORMAL HIGH (ref 3.5–5.1)
Sodium: 135 mmol/L (ref 135–145)
Total Bilirubin: 0.5 mg/dL (ref 0.0–1.2)
Total Protein: 7.2 g/dL (ref 6.5–8.1)

## 2024-04-12 LAB — HEPATITIS B SURFACE ANTIGEN: Hepatitis B Surface Ag: NONREACTIVE

## 2024-04-12 LAB — POTASSIUM: Potassium: 5.5 mmol/L — ABNORMAL HIGH (ref 3.5–5.1)

## 2024-04-12 LAB — LACTIC ACID, PLASMA
Lactic Acid, Venous: 1.3 mmol/L (ref 0.5–1.9)
Lactic Acid, Venous: 0.6 mmol/L (ref 0.5–1.9)

## 2024-04-12 LAB — CBG MONITORING, ED: Glucose-Capillary: 105 mg/dL — ABNORMAL HIGH (ref 70–99)

## 2024-04-12 LAB — PRO BRAIN NATRIURETIC PEPTIDE: Pro Brain Natriuretic Peptide: >35000 pg/mL — ABNORMAL HIGH (ref <300.0)

## 2024-04-12 MED ORDER — LIDOCAINE-PRILOCAINE 2.5-2.5 % EX CREA: 1.0000 | TOPICAL_CREAM | CUTANEOUS | Status: DC | PRN

## 2024-04-12 MED ORDER — DEXTROSE 50 % IV SOLN
1.0000 | Freq: Once | INTRAVENOUS | Status: AC
  Administered 2024-04-12: 50 mL via INTRAVENOUS
  Filled 2024-04-12: qty 50

## 2024-04-12 MED ORDER — NEPRO/CARBSTEADY PO LIQD: 237.0000 mL | ORAL | Status: DC | PRN

## 2024-04-12 MED ORDER — HEPARIN SODIUM (PORCINE) 1000 UNIT/ML DIALYSIS: 1000.0000 [IU] | INTRAMUSCULAR | Status: DC | PRN

## 2024-04-12 MED ORDER — LIDOCAINE HCL (PF) 1 % IJ SOLN: 5.0000 mL | INTRAMUSCULAR | Status: DC | PRN

## 2024-04-12 MED ORDER — CHLORHEXIDINE GLUCONATE CLOTH 2 % EX PADS: 6.0000 | MEDICATED_PAD | Freq: Every day | CUTANEOUS | Status: DC

## 2024-04-12 MED ORDER — PENTAFLUOROPROP-TETRAFLUOROETH EX AERO: 1.0000 | INHALATION_SPRAY | CUTANEOUS | Status: DC | PRN

## 2024-04-12 MED ORDER — INSULIN ASPART 100 UNIT/ML IV SOLN
5.0000 [IU] | Freq: Once | INTRAVENOUS | Status: AC
  Administered 2024-04-12: 5 [IU] via INTRAVENOUS
  Filled 2024-04-12: qty 1

## 2024-04-12 MED ORDER — LACTATED RINGERS IV BOLUS (SEPSIS)
500.0000 mL | Freq: Once | INTRAVENOUS | Status: AC
  Administered 2024-04-12: 500 mL via INTRAVENOUS

## 2024-04-12 MED ORDER — PENTAFLUOROPROP-TETRAFLUOROETH EX AERO
1.0000 | INHALATION_SPRAY | CUTANEOUS | Status: DC | PRN
  Administered 2024-04-12: 1 via TOPICAL

## 2024-04-12 MED ORDER — ALTEPLASE 2 MG IJ SOLR: 2.0000 mg | Freq: Once | INTRAMUSCULAR | Status: DC | PRN

## 2024-04-12 MED ORDER — METRONIDAZOLE 500 MG/100ML IV SOLN
500.0000 mg | Freq: Once | INTRAVENOUS | Status: AC
  Administered 2024-04-12: 500 mg via INTRAVENOUS
  Filled 2024-04-12: qty 100

## 2024-04-12 MED ORDER — SODIUM ZIRCONIUM CYCLOSILICATE 5 G PO PACK
10.0000 g | PACK | Freq: Once | ORAL | Status: AC
  Administered 2024-04-12: 10 g via ORAL
  Filled 2024-04-12: qty 2

## 2024-04-12 MED ORDER — ANTICOAGULANT SODIUM CITRATE 4% (200MG/5ML) IV SOLN: 5.0000 mL | Status: DC | PRN

## 2024-04-12 MED ORDER — ACETAMINOPHEN 325 MG PO TABS
650.0000 mg | ORAL_TABLET | Freq: Once | ORAL | Status: AC
  Administered 2024-04-12: 650 mg via ORAL

## 2024-04-12 MED ORDER — ACETAMINOPHEN 325 MG PO TABS
650.0000 mg | ORAL_TABLET | Freq: Once | ORAL | Status: AC
  Administered 2024-04-12: 650 mg via ORAL
  Filled 2024-04-12: qty 2

## 2024-04-12 MED ORDER — ACETAMINOPHEN 325 MG PO TABS
ORAL_TABLET | ORAL | Status: AC
Start: 2024-04-12 — End: 2024-04-12
  Filled 2024-04-12: qty 2

## 2024-04-12 MED ORDER — ALBUTEROL SULFATE (2.5 MG/3ML) 0.083% IN NEBU
2.5000 mg | INHALATION_SOLUTION | Freq: Once | RESPIRATORY_TRACT | Status: AC
  Administered 2024-04-12: 2.5 mg via RESPIRATORY_TRACT
  Filled 2024-04-12: qty 3

## 2024-04-12 MED ORDER — SODIUM CHLORIDE 0.9 % IV SOLN
2.0000 g | Freq: Once | INTRAVENOUS | Status: AC
  Administered 2024-04-12: 2 g via INTRAVENOUS
  Filled 2024-04-12: qty 12.5

## 2024-04-12 MED ORDER — VANCOMYCIN HCL IN DEXTROSE 1-5 GM/200ML-% IV SOLN
1000.0000 mg | Freq: Once | INTRAVENOUS | Status: AC
  Administered 2024-04-12: 1000 mg via INTRAVENOUS
  Filled 2024-04-12: qty 200

## 2024-04-12 NOTE — ED Notes (Signed)
 IV abx delayed due to obtaining blood cultures. Lab in room now

## 2024-04-12 NOTE — Progress Notes (Signed)
 Called by Dr. Melvenia in ED regarding pt presenting with possible septic joint and normally receives HD on MWF schedule.  Missed HD today and in the ED, K 6.2.  Will place orders for HD today but will need to wait for HD nurse to come up from [facility].  She may need to be transferred to [facility] for irrigation of septic joint.  Will follow for disposition.

## 2024-04-12 NOTE — Procedures (Signed)
 Received patient in bed to unit.  Alert and oriented.  Informed consent signed and in chart.  All procedures explained. LUA AVF cannulated x 2 with 15g needles per policy. Tx initiated per MD order. UF goal 3000 ml.  TX duration:3.5  Tx complete. Blood returned. Needles removed, sites held x 2 until hemostasis achieved. Gauze changed prior to taping.  Patient tolerated well.  Transported back to the room  Alert, without acute distress.  Hand-off given to patient's nurse.   Access used: LUA AVF Access issues: none  Total UF removed: 3000 ml Medication(s) given: See MAR   [facility]

## 2024-04-12 NOTE — ED Triage Notes (Signed)
 Pt bib rcems w/c/o weakness, fever, chills since Saturday. Pt and family report pt had a temp of 102 at home. Pt is on dialysis and is due today. Pt is on 2L if o2 at home. Pt arrived with a Wound vac on her bottom and a suprapubic foley cathter with foul smelling urine. Pt has be hypertensive. Pt wound nurse requested pt come to the ED. PT reports she does not know why. Pt is A&O X 4. But reports she is cold, dizzy and has a headache.

## 2024-04-12 NOTE — ED Provider Notes (Signed)
 From Dr. Melvenia.  She is here with fever weakness.  Elevated blood sugars.  History of end-stage renal disease.  Temp of 102.  Workup with elevated white count and elevated potassium elevated BNP normal lactate.  Urinalysis possible signs of infection.  CT chest abdomen and pelvis showing large hip effusion with some bony erosions concerning for septic arthritis.  She has a prior history of same on the other side.  Had surgery at [facility] at that time.  Orthopedics Dr Genelle consulted and feels needs higher level of care at [facility] due to prior surgery.  Antibiotics given.  She is pending MRI hip and [facility] has been consulted for transfer Physical Exam  BP 123/67   Pulse 86   Temp (!) 101.7 F (38.7 C) (Rectal)   Resp 17   LMP  (LMP Unknown)   SpO2 100%   Physical Exam  Procedures  Procedures  ED Course / MDM    Medical Decision Making Amount and/or Complexity of Data Reviewed Labs: ordered. Radiology: ordered.  Risk OTC drugs. Prescription drug management.   4:38 PM.  Discussed with Brooke at [facility] transfer [employer].  She will get back to me with somebody to talk about excepting patient.  She did say however that there is a wait list.  5:20 PM.  Still febrile at 101.  Tylenol  reordered.  She is on her way up to dialysis.  Repeat potassium 5.5.  I reviewed the MRI report with Dr. Genelle orthopedics.  He still felt with her prior care at [facility] that she would be better served by being there.   5:40 PM.  [facility] called back.  Patient was being accepted for admission by Dr. Sherron.  Awaiting unclean bed.  I updated them that her potassium is improved and that she is on her way to dialysis.  11:10 PM.  Patient is returned from dialysis.  Awaiting bed assignment and transport team.  Signed out to night team Dr. Lorette.    Towana Ozell BROCKS, MD 04/13/24 [id number]

## 2024-04-12 NOTE — ED Provider Notes (Addendum)
 [place] EMERGENCY DEPARTMENT AT [facility] Provider Note   CSN: [id number] Arrival date & time: 04/12/24  9051     Patient presents with: Weakness   [name] is a 58 y.o. female.   HPI Patient presents for fever, chills, generalized weakness.  Medical history includes paraplegia, ESRD, seizures, HTN, anemia, CHF, chronic pressure ulcers.  She had onset of fevers 2 days ago.  Tmax at home was 102 degrees.  EMS noted hyperglycemia during transit.  She also had SpO2 of 90% on room air.  Patient does not have a known history of diabetes.  Patient is bedbound at baseline.    Prior to Admission medications   Medication Sig Start Date End Date Taking? Authorizing Provider  acetaminophen  (TYLENOL ) 500 MG tablet Take 1,000 mg by mouth every 6 (six) hours as needed for moderate pain.    [provider]  albuterol  (PROVENTIL ) (2.5 MG/3ML) 0.083% nebulizer solution Take 3 mLs (2.5 mg total) by nebulization every 4 (four) hours as needed for wheezing or shortness of breath. 04/15/23 04/14/24  Pearlean Manus, MD  amLODipine  (NORVASC ) 5 MG tablet Take 5 mg by mouth every evening. 02/09/24   [provider]  B Complex-C-Folic Acid  (DIALYVITE TABLET) TABS Take 1 tablet by mouth daily. 02/27/24   [provider]  carvedilol  (COREG ) 25 MG tablet Take 25 mg by mouth 2 (two) times daily. 02/03/24   [provider]  cyclobenzaprine  (FLEXERIL ) 10 MG tablet Take 10 mg by mouth 3 (three) times daily as needed. 02/26/24   [provider]  Darbepoetin Alfa  (ARANESP ) 200 MCG/0.4ML SOSY injection Inject 0.4 mLs (200 mcg total) into the skin every Monday at 6 PM. 03/15/24   Ricky Fines, MD  fluconazole (DIFLUCAN) 100 MG tablet Take 200 mg by mouth daily. 01/29/24   [provider]  gabapentin  (NEURONTIN ) 100 MG capsule Take 1 capsule (100 mg total) by mouth 2 (two) times daily. 03/17/24   Tobie Suzzane POUR, MD  hydrocortisone  2.5 % cream  Apply 1 Application topically 2 (two) times daily. 01/29/24   [provider]  hydrOXYzine  (ATARAX ) 10 MG tablet Take 10 mg by mouth every 8 (eight) hours as needed for itching. 01/29/24   [provider]  irbesartan  (AVAPRO ) 150 MG tablet Take 150 mg by mouth every evening. 02/03/24   [provider]  levETIRAcetam  (KEPPRA ) 1000 MG tablet Take 1,000 mg by mouth at bedtime.    [provider]  levETIRAcetam  (KEPPRA ) 500 MG tablet TAKE (2) TABLETS BY MOUTH ONCE A DAY.TAKE AN ADDITIONAL TABLET AFTER DIALYSIS ON MONDAY,WEDNESDAY,AND FRIDAY EVENING. 03/18/24   Bacchus, [name] Z, FNP  lidocaine -prilocaine  (EMLA ) cream Apply 1 Application topically 3 (three) times a week. 06/04/22   [provider]  ondansetron  (ZOFRAN ) 4 MG tablet Take 1 tablet (4 mg total) by mouth every 6 (six) hours. 03/10/24   Idol, Julie, PA-C  ondansetron  (ZOFRAN -ODT) 4 MG disintegrating tablet 4mg  ODT q4 hours prn nausea/vomit 03/09/24   Zammit, Joseph, MD  pantoprazole  (PROTONIX ) 20 MG tablet Take 1 tablet (20 mg total) by mouth daily. 03/09/24   Suzette Pac, MD  senna-docusate (SENOKOT-S) 8.6-50 MG tablet Take 2 tablets by mouth at bedtime. 11/24/23   Pearlean Manus, MD    Allergies: Other, Cefepime , Linezolid, Moxifloxacin, Quinine derivatives, Vancomycin , Azithromycin , Benadryl [diphenhydramine hcl (sleep)], Daptomycin, Piperacillin , Tetracycline, and Zosyn  [piperacillin  sod-tazobactam so]    Review of Systems  Constitutional:  Positive for chills, fatigue and fever.  Skin:  Positive for  wound.  Neurological:  Positive for weakness (Generalized).  All other systems reviewed and are negative.   Updated Vital Signs BP 123/67   Pulse 86   Temp (!) 101.7 F (38.7 C) (Rectal)   Resp 17   LMP  (LMP Unknown)   SpO2 100%   Physical Exam Vitals and nursing note reviewed.  Constitutional:      General: She is not in acute distress.    Appearance: Normal appearance. She is  well-developed. She is not toxic-appearing or diaphoretic.  HENT:     Head: Normocephalic and atraumatic.     Right Ear: External ear normal.     Left Ear: External ear normal.     Nose: Nose normal.     Mouth/Throat:     Mouth: Mucous membranes are moist.  Eyes:     Extraocular Movements: Extraocular movements intact.     Conjunctiva/sclera: Conjunctivae normal.  Cardiovascular:     Rate and Rhythm: Normal rate and regular rhythm.  Pulmonary:     Effort: Pulmonary effort is normal. No respiratory distress.  Abdominal:     General: There is no distension.     Palpations: Abdomen is soft.     Tenderness: There is no abdominal tenderness.  Musculoskeletal:        General: No swelling.     Cervical back: Normal range of motion and neck supple.     Comments: Chronic contractures of lower extremities.  Wound VAC on left hip, right buttock wound present.  Skin:    General: Skin is warm and dry.  Neurological:     Mental Status: She is alert and oriented to person, place, and time. Mental status is at baseline.  Psychiatric:        Mood and Affect: Mood normal.        Behavior: Behavior normal.     (all labs ordered are listed, but only abnormal results are displayed) Labs Reviewed  COMPREHENSIVE METABOLIC PANEL WITH GFR - Abnormal; Notable for the following components:      Result Value   Potassium 6.2 (*)    Chloride 96 (*)    CO2 20 (*)    BUN 76 (*)    Creatinine, Ser 7.38 (*)    GFR, Estimated 6 (*)    Anion gap 19 (*)    All other components within normal limits  CBC WITH DIFFERENTIAL/PLATELET - Abnormal; Notable for the following components:   WBC 12.5 (*)    RBC 3.74 (*)    Hemoglobin 10.4 (*)    HCT 35.2 (*)    MCHC 29.5 (*)    RDW 18.2 (*)    Neutro Abs 10.7 (*)    All other components within normal limits  PROTIME-INR - Abnormal; Notable for the following components:   Prothrombin Time 17.1 (*)    INR 1.3 (*)    All other components within normal limits   PRO BRAIN NATRIURETIC PEPTIDE - Abnormal; Notable for the following components:   Pro Brain Natriuretic Peptide >35,000.0 (*)    All other components within normal limits  URINALYSIS, W/ REFLEX TO CULTURE (INFECTION SUSPECTED) - Abnormal; Notable for the following components:   Color, Urine AMBER (*)    APPearance TURBID (*)    Glucose, UA 50 (*)    Hgb urine dipstick SMALL (*)    Protein, ur >=300 (*)    Leukocytes,Ua SMALL (*)    Bacteria, UA MANY (*)    All other components within normal limits  I-STAT CHEM 8, ED - Abnormal; Notable for the following components:   Sodium 134 (*)    Potassium 6.1 (*)    BUN 88 (*)    Creatinine, Ser 7.90 (*)    Glucose, Bld 101 (*)    Calcium , Ion 1.14 (*)    TCO2 20 (*)    Hemoglobin 11.6 (*)    HCT 34.0 (*)    All other components within normal limits  CBG MONITORING, ED - Abnormal; Notable for the following components:   Glucose-Capillary 105 (*)    All other components within normal limits  RESP PANEL BY RT-PCR (RSV, FLU A&B, COVID)  RVPGX2  CULTURE, BLOOD (ROUTINE X 2)  CULTURE, BLOOD (ROUTINE X 2)  LACTIC ACID, PLASMA  LACTIC ACID, PLASMA  BLOOD GAS, VENOUS  HEPATITIS B SURFACE ANTIGEN  HEPATITIS B SURFACE ANTIBODY, QUANTITATIVE    EKG: None  Radiology: CT CHEST ABDOMEN PELVIS WO CONTRAST Result Date: 04/12/2024 CLINICAL DATA:  Weakness, fevers, and chills EXAM: CT CHEST, ABDOMEN AND PELVIS WITHOUT CONTRAST TECHNIQUE: Multidetector CT imaging of the chest, abdomen and pelvis was performed following the standard protocol without IV contrast. RADIATION DOSE REDUCTION: This exam was performed according to the departmental dose-optimization program which includes automated exposure control, adjustment of the mA and/or kV according to patient size and/or use of iterative reconstruction technique. COMPARISON:  CT abdomen and pelvis dated 03/09/2024, CTA chest dated 11/20/2023 FINDINGS: CT CHEST FINDINGS Cardiovascular: Multichamber  cardiomegaly, shifted into the left hemidiaphragm. No significant pericardial fluid/thickening. Great vessels are normal in course and caliber. Coronary artery calcification. Mediastinum/Nodes: Imaged thyroid gland without nodules meeting criteria for imaging follow-up by size. Normal esophagus. No pathologically enlarged axillary, supraclavicular, mediastinal, or hilar lymph nodes. Lungs/Pleura: The central airways are patent. Chronic dependent bilateral lower lobe atelectasis. No focal consolidation. No pneumothorax. Trace right pleural effusion. Musculoskeletal: Similar appearance of thoracic spinal hardware and anterolisthesis at T6-7 with compression deformity of T7. Old fracture deformity of the sternum. Chronic deformity of the right clavicle. CT ABDOMEN PELVIS FINDINGS Hepatobiliary: No focal hepatic lesions. No intra or extrahepatic biliary ductal dilation. Normal gallbladder. Pancreas: No focal lesions or main ductal dilation. Spleen: Normal in size without focal abnormality. Adrenals/Urinary Tract: Thickening of the adrenal glands without discrete nodule. Atrophic kidneys. Scattered bilateral hypodensities, likely cysts. Decompressed urinary bladder with suprapubic catheter in-situ. Stomach/Bowel: Normal appearance of the stomach. No evidence of bowel wall thickening, distention, or inflammatory changes. Normal appendix. Vascular/Lymphatic: Aortic atherosclerosis. Increased size of 12 mm left external iliac lymph node (5: 476), previously 10 mm. Multiple additional prominent subcentimeter bilateral pelvic and iliac lymph nodes. Reproductive: No adnexal masses. Other: Small volume presacral edema.  No free air. Musculoskeletal: Chronically dislocated hips with findings of chronic osteomyelitis on the left. Persistent soft tissue ulceration along the posterior left proximal thigh and right buttock extending to the right posterior ischium. Increased size of large right hip joint effusion. Increased  conspicuity of right femoral head irregularity and adjacent posterior right acetabulum. Unchanged peripherally calcified oval collection overlying the sacrococcygeal spine. Similar irregularity of the distal coccyx. Diffuse body wall edema. IMPRESSION: 1. Increased size of large right hip joint effusion with increased conspicuity of right femoral head irregularity and adjacent posterior right acetabulum, suspicious for osteomyelitis/septic arthritis. 2. Persistent soft tissue ulceration along the posterior left proximal thigh and right buttock extending to the right posterior ischium. 3. 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 4. Increased size of 12 mm left external iliac lymph node, previously  10 mm, likely reactive. Multiple additional prominent subcentimeter bilateral pelvic and iliac lymph nodes, likely reactive. 5. Trace right pleural effusion. 6.  Aortic Atherosclerosis (ICD10-I70.0). Electronically Signed   By: Limin  Xu M.D.   On: 04/12/2024 13:02   DG Chest Port 1 View Result Date: 04/12/2024 EXAM: 1 VIEW(S) XRAY OF THE CHEST 04/12/2024 10:58:34 AM COMPARISON: AP radiograph of the chest dated 03/09/2024. CLINICAL HISTORY: Questionable sepsis - evaluate for abnormality. FINDINGS: LUNGS AND PLEURA: There is mild hazy opacification of the lung bases bilaterally. The lung bases are significantly clearer than on the previous study. Findings likely represent resolving bibasilar pneumonia or pulmonary edema. There are small residual pleural effusions, also improved in the interim. No pneumothorax. HEART AND MEDIASTINUM: The heart is mildly enlarged. BONES AND SOFT TISSUES: The patient is status post posterolateral spinal fusion of the thoracic spine. IMPRESSION: 1. Mild hazy opacification of the lung bases bilaterally, significantly improved compared to the prior study, likely representing resolving bibasilar pneumonia or pulmonary edema. 2. Small residual pleural effusions, also  improved in the interim. 3. Mildly enlarged heart. Electronically signed by: Evalene Coho MD 04/12/2024 11:32 AM EST RP Workstation: [id number]     Procedures   Medications Ordered in the ED  Chlorhexidine  Gluconate Cloth 2 % PADS 6 each (has no administration in time range)  lactated ringers  bolus 500 mL (0 mLs Intravenous Stopped 04/12/24 1419)  ceFEPIme  (MAXIPIME ) 2 g in sodium chloride  0.9 % 100 mL IVPB (0 g Intravenous Stopped 04/12/24 1240)  metroNIDAZOLE  (FLAGYL ) IVPB 500 mg (0 mg Intravenous Stopped 04/12/24 1419)  vancomycin  (VANCOCIN ) IVPB 1000 mg/200 mL premix (1,000 mg Intravenous New Bag/Given 04/12/24 1413)  acetaminophen  (TYLENOL ) tablet 650 mg (650 mg Oral Given 04/12/24 1057)  insulin  aspart (novoLOG ) injection 5 Units (5 Units Intravenous Given 04/12/24 1413)    And  dextrose  50 % solution 50 mL (50 mLs Intravenous Given 04/12/24 1410)  sodium zirconium cyclosilicate  (LOKELMA ) packet 10 g (10 g Oral Given 04/12/24 1410)  albuterol  (PROVENTIL ) (2.5 MG/3ML) 0.083% nebulizer solution 2.5 mg (2.5 mg Nebulization Given 04/12/24 1410)                                    Medical Decision Making Amount and/or Complexity of Data Reviewed Labs: ordered. Radiology: ordered.  Risk OTC drugs. Prescription drug management.   This patient presents to the ED for concern of fever and generalized weakness, this involves an extensive number of treatment options, and is a complaint that carries with it a high risk of complications and morbidity.  The differential diagnosis includes sepsis, viral infection, dehydration, metabolic derangements   Co morbidities / Chronic conditions that complicate the patient evaluation  paraplegia, ESRD, seizures, HTN, anemia, CHF, chronic pressure ulcers   Additional history obtained:  Additional history obtained from EMR External records from outside source obtained and reviewed including EMS   Lab Tests:  I Ordered, and personally  interpreted labs.  The pertinent results include: Leukocytosis is present consistent with acute bacterial infection; urinalysis shows evidence of infection versus colonization; creatinine and BUN elevated consistent with ESRD; hyperkalemia is present.   Imaging Studies ordered:  I ordered imaging studies including chest x-ray, CT of chest, abdomen, pelvis; MRI of right hip I independently visualized and interpreted imaging which showed CT showed increased size of right hip joint effusion with irregularity of femoral head concerning for osteomyelitis/septic arthritis.  MRI pending at time of  signout. I agree with the radiologist interpretation   Cardiac Monitoring: / EKG:  The patient was maintained on a cardiac monitor.  I personally viewed and interpreted the cardiac monitored which showed an underlying rhythm of: Sinus rhythm   Problem List / ED Course / Critical interventions / Medication management  Patient presenting for fevers and chills over the past 2 days.  She does have known chronic buttock wounds.  She arrives with wound VAC in place on left hip.  Right buttock wound soiled with feces.  Family noted Tmax at home of 102 degrees.  Today, patient found to be febrile in the ED.  EMS noted tachycardia and borderline hypoxia.  Septic workup and treatment was initiated.  Per chart review, she has had multiple admissions this year for sepsis.  Most recently, she had a prolonged hospitalization 3 months ago for left hip septic arthritis.  She was treated with metronidazole , cefepime , and vancomycin .  Of note, she does have listed allergies to these medications but did tolerate it during recent hospital admission.  She underwent resection arthroplasty on 8/15 with Dr. Tanda.  She has had a wound VAC over her left hip since that time.  She was discharged on IV set pain and vancomycin  to be given during dialysis sessions on M, W, F for the following 6 weeks.  She did grow fungus on cultures and was  started on fluconazole at the time.  She was seen again in the ED 1 month ago.  At that time, she had gram-positive rod bacteremia which appeared to be contaminant.  Today, patient started on vancomycin , cefepime , Flagyl .  Lab work notable for leukocytosis and hyperkalemia.  Temporizing medications for hyperkalemia were ordered.  I spoke with the nephrologist on-call, Dr. Rayburn, who will arrange for dialysis.  Imaging studies showed concern for a right hip osteomyelitis/septic arthritis.  MRI was ordered to further evaluate.  I spoke with orthopedic surgeon on-call, Dr. Genelle.  Initial plan was for management [facility], however, Dr. Harden is currently out for the week and trauma surgery partners requested reaching out to [facility] for possible transfer there given that they managed her prior hip surgery.  Care of patient was signed out to oncoming ED provider. I ordered medication including IV fluids and broad-spectrum antibiotics for empiric treatment of sepsis Reevaluation of the patient after these medicines showed that the patient improved I have reviewed the patients home medicines and have made adjustments as needed   Consultations Obtained:  I requested consultation with the orthopedic surgeon, Dr. Genelle,  and discussed lab and imaging findings as well as pertinent plan - they recommend: Transfer to [facility]   Social Determinants of Health:  Lives at home with family  CRITICAL CARE Performed by: Bernardino Fireman   Total critical care time: 32 sepsis minutes  Critical care time was exclusive of separately billable procedures and treating other patients.  Critical care was necessary to treat or prevent imminent or life-threatening deterioration.  Critical care was time spent personally by me on the following activities: development of treatment plan with patient and/or surrogate as well as nursing, discussions with consultants, evaluation of patient's response to treatment, examination of  patient, obtaining history from patient or surrogate, ordering and performing treatments and interventions, ordering and review of laboratory studies, ordering and review of radiographic studies, pulse oximetry and re-evaluation of patient's condition.     Final diagnoses:  Sepsis, due to unspecified organism, unspecified whether acute organ dysfunction present [facility])  Right hip  joint effusion  Hyperkalemia    ED Discharge Orders     None          Melvenia Motto, MD 04/12/24 1518    Melvenia Motto, MD 04/12/24 [phone]

## 2024-04-12 NOTE — ED Notes (Signed)
 X-ray at bedside.

## 2024-04-12 NOTE — ED Notes (Signed)
 ED Provider at bedside.

## 2024-04-12 NOTE — ED Notes (Signed)
Report given to dialysis nurse.

## 2024-04-12 NOTE — ED Notes (Signed)
 Changed patients bandage for the wound on her right side buttocks due to feces on the bandage.

## 2024-04-12 NOTE — Progress Notes (Signed)
 Navigator was contacted by clinic, [facility] via phone informing this pt was on her way here. Pt receives out-pt HD at [facility]. MWF, 1100 chair time, will continue to assist as needed. Pt last HD was Friday, tx was shortened, pt left AMA due to uncomfort, ran 3 hrs. Will continue to assist as needed.    [name] Dialysis Nav [phone] [place] #- [phone]

## 2024-04-13 DIAGNOSIS — A419 Sepsis, unspecified organism: Secondary | ICD-10-CM | POA: Diagnosis present

## 2024-04-13 DIAGNOSIS — M25451 Effusion, right hip: Secondary | ICD-10-CM | POA: Diagnosis present

## 2024-04-13 DIAGNOSIS — I10 Essential (primary) hypertension: Secondary | ICD-10-CM | POA: Diagnosis not present

## 2024-04-13 DIAGNOSIS — Z743 Need for continuous supervision: Secondary | ICD-10-CM | POA: Diagnosis not present

## 2024-04-13 DIAGNOSIS — R531 Weakness: Secondary | ICD-10-CM | POA: Diagnosis not present

## 2024-04-13 DIAGNOSIS — R509 Fever, unspecified: Secondary | ICD-10-CM | POA: Diagnosis not present

## 2024-04-13 DIAGNOSIS — E875 Hyperkalemia: Secondary | ICD-10-CM | POA: Diagnosis present

## 2024-04-13 LAB — HEPATITIS B SURFACE ANTIBODY, QUANTITATIVE: Hep B S AB Quant (Post): 417 m[IU]/mL

## 2024-04-13 NOTE — Progress Notes (Signed)
 Late note entry 11/18 0950  Noted that pt was transferred to higher level of care. Contacted out-pt HD clinic, [facility], to inform of this. Last nephrology note and last hospitalist note have been faxed at this time. No further support needed.   Lavanda Ellis Mehaffey Dialysis Navigator [phone]

## 2024-04-15 NOTE — Unmapped External Note (Addendum)
 S>he was readmitted 04/21/24 to [facility] for altered mental status.  CT scan of chest/abdomen/pelvis revealed moderate amount of gas within the right renal collecting system likely secondary to reflux from indwelling catheter, similar appearance of large decubitus ulcers, but no other infectious etiology.  An MRI brain with no abnormality identified except for some chronic small vessel ischemic disease.  Urine culture with Pseudomonas.  Her acute encephalopathy was thought to be secondary to cefepime  and a possible UTI.  Her cefepime  was discontinued and patient was started on ceftazidime  to be continued with hemodialysis. Updated opat orders only on 05/07/24 (m.[email or web address], APP)   ID Outpatient Parenteral Antimicrobial Therapy Orders   This patient is being treated for septic arthritis. DOB: 09/26/1965  Anticipated OPAT Start Date: 04/13/2024 Anaphylactic kit: Yes Allergies: Cefepime , Daptomycin, Diphenhydramine hcl, Linezolid, Moxifloxacin, Piperacillin -tazobactam, Quinine, Vancomycin , Azithromycin , Piperacillin , and Tetracycline Last Height: Height: 1.676 m (5' 5.98) (04/16/2024 12:40 PM) Last Weight: Weight: 49.6 kg (109 lb 5.6 oz) (04/16/2024  4:15 PM) Last 3 Creatinine Results:  Creatinine  Date Value Ref Range Status  04/17/2024 2.81 (H) 0.60 - 1.20 mg/dL Final  88/78/7974 5.85 (H) 0.60 - 1.20 mg/dL Final  88/79/7974 7.37 (H) 0.60 - 1.20 mg/dL Final    Pathogens: No culture data  Patient Type: general  Infection of Implanted Material: no,    Fax Labs To (within 24 hours): [phone] Lab Draw Instructions: If standard lab draw falls on a holiday or there is inclement weather, labs may be drawn the day prior or the day after scheduled lab draw.  Ordering Provider:     Fonda Splinter, MD   OPAT Contact Information: [phone]   Follow-up Instructions: 2-4 weeks with ID Contact OPAT team with questions regarding: antimicrobial therapy, delay in therapy for any reason, any difficulty  with IV access (including limb swelling), adverse drug reactions, fever greater than 100.4 degrees F and symptoms of DVT  Outpatient IV Antimicrobial Therapy Location: Dialysis IV Access: Dialysis      IV Catheter Care Instructions: Inspect and clean site and apply a clear dressing with a Biopatch (or similar chlorhexidine  impregnated product) weekly. IV catheter care instructions per agency protocol. IV Flushes: Adult IV flush for central, midlines, and peripheral catheters:  10 mL Normal Saline before and after medication administration 5 mL Heparin  10 units/mL following last Normal Saline flush 5 mL Heparin  10 units/mL daily for unused lumens  Antimicrobials Parenteral Antimicrobials Cefepime  Dose: 2 g (Each dialysis day, dosed after HD session) Cefepime  Start Date: 04/13/24 Ceftazidime  End Date: 05/24/24 Pharmacy or Provider to Dose Vancomycin ?: Pharmacy to Dose Vancomycin  Start Date: 04/13/24 Vancomycin  End Date: 05/24/24   Frequency Lab Test Additional Information  Weekly CMP, CBC with diff and platelet, ESR, non-cardiac CRP, Vancomycin  trough Specific Day: Monday  Twice Weekly      Other     Additional Information: N/A

## 2024-04-17 LAB — CULTURE, BLOOD (ROUTINE X 2)
Culture: NO GROWTH
Culture: NO GROWTH

## 2024-04-17 NOTE — Discharge Summary (Signed)
 Nephrology discharge Summary   Demographics: [name]  58 y.o. 10-Sep-1965 MRN: [id number]    Extended Emergency Contact Information Primary Emergency Contact: Valadez,[name] Home Phone: [phone] Relation: Daughter  Full Code  Admit Date: 04/13/2024                            Attending Physician: Leora Reams Thazhathu* Discharge Date: 04/17/2024  Primary Care Provider: [name] Kou [name], FNP   [phone]  Consults during this admission: Consult Orders             IP CONSULT TO INTERVENTIONAL RADIOLOGY       Provider:  (Not yet assigned)      IP CONSULT TO INFECTIOUS DISEASES       Specialty:  Infectious Diseases  Provider:  (Not yet assigned)      IP CONSULT TO NEPHROLOGY       Specialty:  Nephrology  Provider:  (Not yet assigned)      IP CONSULT TO ORTHOPEDIC SURGERY       Specialty:  Orthopedic Surgery  Provider:  (Not yet assigned)             Active & Resolved Diagnosis: Principal Problem:   Septic arthritis    (CMD) Active Problems:   ESRD on hemodialysis    (CMD)   Osteomyelitis (CMD)   Hip swelling, right Resolved Problems:   * No resolved hospital problems.  Disposition: Patient discharged to Home in fair condition.  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  Her principal diagnosis was septic arthritis of the right hip, with concern for possible underlying osteomyelitis, as supported by CT and MRI findings of a large right hip joint effusion, chronic posterior displacement, and bony changes suspicious for infection. Orthopedic surgery and infectious disease consultants recommended IR-guided aspiration for diagnostic confirmation, but the procedure yielded a dry tap and no cultures were obtained.Blood cultures remained negative throughout the admission.  She was managed with broad-spectrum intravenous antibiotics, specifically vancomycin  and cefepime , with dosing coordinated around her hemodialysis schedule and ongoing therapeutic drug monitoring. Chronic fluconazole  was continued for prior Candida wound cultures, with a planned course through February 2026.Infectious disease recommended a total of six weeks of IV antibiotics for septic arthritis, to be administered via outpatient parenteral antimicrobial therapy (OPAT) at her dialysis center, with weekly lab monitoring and follow-up arranged.  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 Nutrition consult addressed her underweight status (BMI 16.9) and increased protein needs for wound healing. She remained  on a renal diet with supplemental Ensure Plus.  Her ESRD was managed with scheduled hemodialysis, with no acute dialytic complications and stable access via left AV fistula.Anemia of CKD was addressed with darbepoetin during admission, with plans to resume  outpatient ESA therapy at discharge. Blood pressure was managed with amlodipine  and hydralazine .  She experienced no acute events during the admission, remained afebrile, and reported only chronic positional back and rib pain.Discharge planning included home health for wound vac replacement, continuation of OPAT for antibiotics, and follow-up with infectious disease and wound care.   Wound / Incision Assessment: Refer to Chart Review and Media Tab for images if available.  Wound 12/31/23 Pressure Injury Ischium Right (Active)  Date First Assessed/Time First Assessed: 12/31/23 2218   Pre-Existing Wound: Yes  Primary Wound Type: Pressure Injury  Location: (c) Ischium  Wound Location Orientation: Right     Wound 12/31/23 Pressure Injury Ischium Left (Active)  Date First Assessed/Time First Assessed: 12/31/23 2218   Pre-Existing Wound: Yes  Primary Wound Type: Pressure Injury  Location: Ischium  Wound Location Orientation: Left     Wound 01/09/24 Incision Hip Left;Lateral (Active)  Date First Assessed/Time First Assessed: 01/09/24 1658   Pre-Existing Wound: Yes  Primary Wound Type: Incision  Location: Hip  Wound Location Orientation: Left;Lateral  Wound Description (Comments): Surgical Incision     Wound 02/05/24 Pressure Injury Knee Anterior;Left (Active)  Date First Assessed/Time First Assessed: 02/05/24 1103   Pre-Existing Wound: Yes  Primary Wound Type: Pressure Injury  Location: Knee  Wound Location Orientation: Anterior;Left        Vital Sign Range:  Temp:  [97.6 F (36.4 C)-98.2 F (36.8 C)] 97.9 F (36.6 C) Heart Rate:  [80-94] 87 Resp:  [15-26] 18 BP: (116-187)/(63-93) 155/84   Physical Exam Constitutional:      Appearance: She is overall ill-appearing HENT:     Mouth/Throat:     Mouth: Mucous membranes are moist.  Cardiovascular:     Rate and Rhythm: Normal rate and regular rhythm.     Heart sounds: Normal heart sounds.  Pulmonary:     Effort: Pulmonary effort is  normal.     Breath sounds: Normal breath sounds.  Abdominal:     General: Abdomen is flat.     Palpations: Abdomen is soft.     Comments: Suprapubic catheter CDI with minimal output <10 cc  Musculoskeletal:     Comments: Wound vac on left buttock  Right hip with overlying bandage without serous leakage. Did not assess wound.    Bilateral lower extremities contracted and atrophic  Neurological:     Mental Status: She is alert.      Discharge Medications     Unreviewed Medications      Sig Disp Refill Start End  acetaminophen  500 mg tablet Commonly known as: TYLENOL   Take 1,000 mg by mouth as needed in the morning and 1,000 mg as needed in the evening for mild pain (1-3).   0     * albuterol  2.5 mg /3 mL (0.083 %) nebulizer solution  Take 2.5 mg by nebulization every 6 (six) hours as needed for wheezing or shortness of breath.   0     * albuterol  HFA 90 mcg/actuation inhaler Commonly known as: PROVENTIL  HFA;VENTOLIN  HFA;PROAIR  HFA  Inhale 1-2 puffs every 4 (four) hours as needed for shortness of breath or wheezing.   0     amLODIPine  10 mg tablet Commonly known as: NORVASC   Take 1 tablet by mouth daily.  0     C-Tub Misc Generic drug: miscellaneous medical supply   200 each  11     carvediloL  25 mg tablet Commonly known as: COREG   Take 25 mg by mouth 2 (two) times a day.   0     collagenase  250 unit/gram ointment Commonly known as: SANTYL   Apply topically daily.   0     cyclobenzaprine  10 mg tablet Commonly known as: FLEXERIL  Ask about: Which instructions should I use?  Take 10 mg by mouth 3 (three) times a day as needed for muscle spasms.   0     Dialyvite 100-1 mg tablet Generic drug: B complex-vitamin C -folic acid   Take 1 tablet by mouth daily.   0     DOCUSATE SODIUM  ORAL  Take 1 capsule by mouth nightly.   0     fluconazole  100 mg tablet Commonly known as: DIFLUCAN   Take 2 tablets (200 mg total) by mouth daily. Take AFTER dialysis daily   30 tablet  4     furosemide  80 mg tablet Commonly known as: LASIX   Take 80 mg by mouth 4 (four) times a week. Take on non dialysis days.   0     gabapentin  100 mg capsule Commonly known as: NEURONTIN   Take 100 mg by mouth 2 (two) times a day.   0     hydrocortisone  2.5 % cream  Apply topically 2 (two) times a day.  28 g  1     hydrOXYzine  10 mg tablet Commonly known as: ATARAX   Take 1 tablet (10 mg total) by mouth every 8 (eight) hours as needed for itching.  30 tablet  0     irbesartan  150 mg tablet Commonly known as: AVAPRO   Take 150 mg by mouth every evening.   0     * levETIRAcetam  1,000 mg tablet Commonly known as: KEPPRA   Take 1 tablet (1,000 mg total) by mouth at bedtime.  90 tablet  0     * levETIRAcetam  500 mg tablet Commonly known as: KEPPRA   Take 1 tablet (500 mg total) by mouth every Monday, Wednesday and Friday.  36 tablet  0     lidocaine -prilocaine  2.5-2.5 % cream Commonly known as: EMLA   Apply topically three times weekly before dialysis   0     ondansetron  4 mg tablet Commonly known as: ZOFRAN   Take 4 mg by mouth every 8 (eight) hours as needed for nausea or vomiting.   0     pantoprazole  40 mg EC tablet Commonly known as: PROTONIX   Take 1 tablet by mouth daily.   0     Rena-Vite Rx 1-60-300 mg-mg-mcg tablet Generic drug: B complex-vitamin C -folic acid   Take 1 tablet by mouth daily with breakfast.   0        * * There are duplicate medications prescribed to the patient          Discharge Orders     Home Health Labs     Details:    Home Labs: OPAT Labs   OPAT Lab(s) - Weekly:  CMP CBC with diff and platelet ESR non-cardiac CRP     OPAT Specific Day - Weekly: Monday   OPAT Comment - Weekly: Per OPAT Protocol   Call Results To: Fonda Splinter, MD   Phone: [phone]   Home Health Skilled Nursing     Details:    Home Health Skilled Nursing Service: Resume Home Health   Home Health Skilled Nursing  Details:     Home Health Skilled Nursing Service: Medication   Medication Education: Education on IV/IM/SQ Administration   Outpatient Parenteral Antimicrobial Therapy: Yes - See OPAT Orders         Lab Results  Component Value Date/Time   HGB 10.1 (L) 04/17/2024 05:30 AM   HCT 31.6 (L) 04/17/2024 05:30 AM   WBC 5.30 04/17/2024 05:30 AM   PLT 220 04/17/2024 05:30 AM   Lab Results  Component Value Date/Time   NA 139 04/17/2024 05:30 AM   K 4.2 04/17/2024 05:30 AM   CREATININE 2.81 (H) 04/17/2024 05:30 AM   BUN 13 04/17/2024 05:30 AM   GLUCOSE 87 04/17/2024 05:30 AM    Pertinent Imaging: FL Jt Blck Maj Asp And/Or Inj  Final Result by Roena Hy, MD (11/19 1321)  FL JOINT BLOCK MAJ ASP AND/OR INJ 04/14/2024 11:30 AM    INDICATION: R hip joint aspiration r/o septic joint    COMPARISON: None    PROCEDURE: Fluoroscopic-guided right hip joint aspiration.    PHYSICIANS: Dr. Amareen under the supervision of Dr. Hy    TECHNIQUE: Patient was positively identified. After thorough discussion of   the benefits and possible complications of the procedure (including   bleeding, infection, extra-articular contrast injection, adverse contrast   reaction, and nondiagnostic study), written and oral informed consent was   obtained.     The patient was positioned supine on the fluoroscopy table and preliminary   localization was performed using fluoroscopy over the right hip. The   patient was prepped and draped in usual sterile fashion. Local anesthesia   was provided with 1% lidocaine  without epinephrine . An 18-gauge 3-1/2 inch   spinal needle was advanced under fluoroscopic guidance into the hip joint.   Three attempts of joint aspiration yielded no fluid. The needle was   withdrawn, and a sterile dressing was applied.    The patient tolerated the procedure well without complications.      TOTAL IMAGES OBTAINED:3    CONCLUSION  Dry tap right hip joint aspiration.      XR Hip Uni W Or  W/O Pelvis 2-3 Vw Rt  Final Result by Roena Hy, MD (11/19 0420)  X-RAY HIP RIGHT WITH OR WITHOUT PELVIS (2-3 VIEWS), 04/13/2024 10:28 AM    INDICATION: c/f septic arthritis   COMPARISON: Radiographs from 07/25/2022, CT 12/31/2023    IMPRESSION:  1.  Subluxation/dislocation of the right hip.  2.  Remodeling of the right hip joint with osseous erosions of the   acetabulum and femoral head, new from 07/25/2022, but unchanged compared to   CT examination may be posttraumatic or related to septic joint or sequela   thereof. MRI is more sensitive to further assess.  3.  Status post left hip Girdlestone procedure. Chronic erosions of the   bilateral ischial tuberosities. Cannot exclude acute on chronic   osteomyelitis.  4.  Suprapubic catheter projects over the pelvis.      Predictive Model Details        23.7% (Medium)  Factor Value   Calculated 04/17/2024 12:03 12% Number of ED visits in last 90 days 0   Readmission Risk Score v2 Model 10% Braden score 13    9% Number of active outpatient medication orders 22    8% Latest hemoglobin in last 72 hrs 10.1 g/dL    8% Diagnosis of fluid or electrolyte disorder present     Electronically signed by: Leora Erskin Tara Le, MD 04/17/2024 3:22 PM  Time spent on discharge:40 minutes  *Some images could not be shown.

## 2024-04-19 ENCOUNTER — Other Ambulatory Visit: Payer: Self-pay

## 2024-04-19 ENCOUNTER — Emergency Department (HOSPITAL_COMMUNITY)

## 2024-04-19 ENCOUNTER — Inpatient Hospital Stay (HOSPITAL_COMMUNITY)
Admission: EM | Admit: 2024-04-19 | Discharge: 2024-04-21 | DRG: 698 | Disposition: A | Discharge status: Home Health | Source: Ambulatory Visit | Attending: Internal Medicine | Admitting: Internal Medicine

## 2024-04-19 ENCOUNTER — Encounter (HOSPITAL_COMMUNITY): Payer: Self-pay

## 2024-04-19 ENCOUNTER — Telehealth: Payer: Self-pay

## 2024-04-19 DIAGNOSIS — E46 Unspecified protein-calorie malnutrition: Secondary | ICD-10-CM | POA: Diagnosis present

## 2024-04-19 DIAGNOSIS — R079 Chest pain, unspecified: Secondary | ICD-10-CM | POA: Diagnosis not present

## 2024-04-19 DIAGNOSIS — M009 Pyogenic arthritis, unspecified: Secondary | ICD-10-CM | POA: Diagnosis present

## 2024-04-19 DIAGNOSIS — I5A Non-ischemic myocardial injury (non-traumatic): Secondary | ICD-10-CM

## 2024-04-19 DIAGNOSIS — G8220 Paraplegia, unspecified: Secondary | ICD-10-CM | POA: Diagnosis present

## 2024-04-19 DIAGNOSIS — I161 Hypertensive emergency: Secondary | ICD-10-CM

## 2024-04-19 DIAGNOSIS — N186 End stage renal disease: Secondary | ICD-10-CM

## 2024-04-19 DIAGNOSIS — G9341 Metabolic encephalopathy: Secondary | ICD-10-CM | POA: Diagnosis present

## 2024-04-19 DIAGNOSIS — L89309 Pressure ulcer of unspecified buttock, unspecified stage: Secondary | ICD-10-CM | POA: Diagnosis present

## 2024-04-19 DIAGNOSIS — E440 Moderate protein-calorie malnutrition: Secondary | ICD-10-CM | POA: Insufficient documentation

## 2024-04-19 DIAGNOSIS — I10 Essential (primary) hypertension: Secondary | ICD-10-CM | POA: Diagnosis present

## 2024-04-19 DIAGNOSIS — Z87898 Personal history of other specified conditions: Secondary | ICD-10-CM

## 2024-04-19 DIAGNOSIS — I214 Non-ST elevation (NSTEMI) myocardial infarction: Principal | ICD-10-CM

## 2024-04-19 DIAGNOSIS — I5032 Chronic diastolic (congestive) heart failure: Secondary | ICD-10-CM | POA: Diagnosis present

## 2024-04-19 LAB — MAGNESIUM: Magnesium: 1.9 mg/dL (ref 1.7–2.4)

## 2024-04-19 LAB — BLOOD GAS, VENOUS
Acid-Base Excess: 5.8 mmol/L — ABNORMAL HIGH (ref 0.0–2.0)
Bicarbonate: 29.8 mmol/L — ABNORMAL HIGH (ref 20.0–28.0)
Drawn by: 66297
O2 Saturation: 80.8 %
Patient temperature: 36.7
pCO2, Ven: 39 mmHg — ABNORMAL LOW (ref 44–60)
pH, Ven: 7.49 — ABNORMAL HIGH (ref 7.25–7.43)
pO2, Ven: 49 mmHg — ABNORMAL HIGH (ref 32–45)

## 2024-04-19 LAB — CBC WITH DIFFERENTIAL/PLATELET
Abs Immature Granulocytes: 0.07 K/uL (ref 0.00–0.07)
Basophils Absolute: 0.1 K/uL (ref 0.0–0.1)
Basophils Relative: 1 %
Eosinophils Absolute: 0.4 K/uL (ref 0.0–0.5)
Eosinophils Relative: 6 %
HCT: 31.9 % — ABNORMAL LOW (ref 36.0–46.0)
Hemoglobin: 9.5 g/dL — ABNORMAL LOW (ref 12.0–15.0)
Immature Granulocytes: 1 %
Lymphocytes Relative: 20 %
Lymphs Abs: 1.2 K/uL (ref 0.7–4.0)
MCH: 27.5 pg (ref 26.0–34.0)
MCHC: 29.8 g/dL — ABNORMAL LOW (ref 30.0–36.0)
MCV: 92.2 fL (ref 80.0–100.0)
Monocytes Absolute: 0.5 K/uL (ref 0.1–1.0)
Monocytes Relative: 9 %
Neutro Abs: 3.9 K/uL (ref 1.7–7.7)
Neutrophils Relative %: 63 %
Platelets: 241 K/uL (ref 150–400)
RBC: 3.46 MIL/uL — ABNORMAL LOW (ref 3.87–5.11)
RDW: 18.1 % — ABNORMAL HIGH (ref 11.5–15.5)
WBC: 6.1 K/uL (ref 4.0–10.5)
nRBC: 0 % (ref 0.0–0.2)

## 2024-04-19 LAB — HEPATIC FUNCTION PANEL
ALT: 7 U/L (ref 0–44)
AST: 15 U/L (ref 15–41)
Albumin: 3.6 g/dL (ref 3.5–5.0)
Alkaline Phosphatase: 75 U/L (ref 38–126)
Bilirubin, Direct: 0.2 mg/dL (ref 0.0–0.2)
Indirect Bilirubin: 0.3 mg/dL (ref 0.3–0.9)
Total Bilirubin: 0.5 mg/dL (ref 0.0–1.2)
Total Protein: 7.3 g/dL (ref 6.5–8.1)

## 2024-04-19 LAB — PROTIME-INR
INR: 1.1 (ref 0.8–1.2)
Prothrombin Time: 14.6 s (ref 11.4–15.2)

## 2024-04-19 LAB — CBG MONITORING, ED: Glucose-Capillary: 102 mg/dL — ABNORMAL HIGH (ref 70–99)

## 2024-04-19 LAB — URINALYSIS, W/ REFLEX TO CULTURE (INFECTION SUSPECTED)
Bilirubin Urine: NEGATIVE
Glucose, UA: >=500 mg/dL — AB
Ketones, ur: 5 mg/dL — AB
Nitrite: NEGATIVE
Protein, ur: >=300 mg/dL — AB
Specific Gravity, Urine: 1.008 (ref 1.005–1.030)
WBC, UA: >50 WBC/hpf (ref 0–5)
pH: 8 (ref 5.0–8.0)

## 2024-04-19 LAB — LACTIC ACID, PLASMA
Lactic Acid, Venous: 0.6 mmol/L (ref 0.5–1.9)
Lactic Acid, Venous: 0.6 mmol/L (ref 0.5–1.9)

## 2024-04-19 LAB — AMMONIA: Ammonia: <13 umol/L (ref 9–35)

## 2024-04-19 LAB — TROPONIN T, HIGH SENSITIVITY
Troponin T High Sensitivity: 160 ng/L (ref 0–19)
Troponin T High Sensitivity: 164 ng/L (ref 0–19)

## 2024-04-19 MED ORDER — PROCHLORPERAZINE EDISYLATE 10 MG/2ML IJ SOLN: 5.0000 mg | Freq: Four times a day (QID) | INTRAMUSCULAR | Status: DC | PRN

## 2024-04-19 MED ORDER — CHLORHEXIDINE GLUCONATE CLOTH 2 % EX PADS: 6.0000 | MEDICATED_PAD | Freq: Every day | CUTANEOUS | Status: DC

## 2024-04-19 MED ORDER — SODIUM CHLORIDE 0.9 % IV SOLN
1.0000 g | INTRAVENOUS | Status: DC
  Administered 2024-04-19: 1 g via INTRAVENOUS
  Filled 2024-04-19 (×3): qty 10

## 2024-04-19 MED ORDER — HEPARIN SODIUM (PORCINE) 5000 UNIT/ML IJ SOLN
5000.0000 [IU] | Freq: Three times a day (TID) | INTRAMUSCULAR | Status: DC
  Administered 2024-04-19 – 2024-04-21 (×6): 5000 [IU] via SUBCUTANEOUS
  Filled 2024-04-19 (×6): qty 1

## 2024-04-19 MED ORDER — ASPIRIN 81 MG PO TBEC
81.0000 mg | DELAYED_RELEASE_TABLET | Freq: Every day | ORAL | Status: DC
  Administered 2024-04-20 – 2024-04-21 (×2): 81 mg via ORAL
  Filled 2024-04-19 (×2): qty 1

## 2024-04-19 MED ORDER — POLYETHYLENE GLYCOL 3350 17 G PO PACK: 17.0000 g | PACK | Freq: Every day | ORAL | Status: DC | PRN

## 2024-04-19 MED ORDER — CARVEDILOL 12.5 MG PO TABS
25.0000 mg | ORAL_TABLET | Freq: Two times a day (BID) | ORAL | Status: DC
  Administered 2024-04-19 – 2024-04-21 (×4): 25 mg via ORAL
  Filled 2024-04-19 (×4): qty 2

## 2024-04-19 MED ORDER — ASPIRIN 81 MG PO TBEC: 81.0000 mg | DELAYED_RELEASE_TABLET | Freq: Every day | ORAL | Status: DC

## 2024-04-19 MED ORDER — NITROGLYCERIN 2 % TD OINT
1.0000 [in_us] | TOPICAL_OINTMENT | Freq: Once | TRANSDERMAL | Status: AC
  Administered 2024-04-19: 1 [in_us] via TOPICAL
  Filled 2024-04-19: qty 1

## 2024-04-19 MED ORDER — ACETAMINOPHEN 500 MG PO TABS
500.0000 mg | ORAL_TABLET | Freq: Four times a day (QID) | ORAL | Status: DC | PRN
  Administered 2024-04-20: 500 mg via ORAL
  Filled 2024-04-19: qty 1

## 2024-04-19 MED ORDER — FENTANYL CITRATE (PF) 100 MCG/2ML IJ SOLN
50.0000 ug | Freq: Once | INTRAMUSCULAR | Status: AC
  Administered 2024-04-19: 50 ug via INTRAVENOUS
  Filled 2024-04-19: qty 2

## 2024-04-19 MED ORDER — NITROGLYCERIN IN D5W 200-5 MCG/ML-% IV SOLN
5.0000 ug/min | INTRAVENOUS | Status: DC
  Administered 2024-04-19: 5 ug/min via INTRAVENOUS
  Filled 2024-04-19: qty 250

## 2024-04-19 MED ORDER — ASPIRIN 81 MG PO CHEW
324.0000 mg | CHEWABLE_TABLET | Freq: Once | ORAL | Status: AC
  Administered 2024-04-19: 324 mg via ORAL
  Filled 2024-04-19: qty 4

## 2024-04-19 MED ORDER — LEVETIRACETAM (KEPPRA) 500 MG/5 ML ADULT IV PUSH
500.0000 mg | Freq: Once | INTRAVENOUS | Status: AC
  Administered 2024-04-19: 500 mg via INTRAVENOUS
  Filled 2024-04-19: qty 5

## 2024-04-19 MED ORDER — SODIUM CHLORIDE 0.9% FLUSH
3.0000 mL | Freq: Once | INTRAVENOUS | Status: AC
  Administered 2024-04-19: 3 mL via INTRAVENOUS

## 2024-04-19 MED ORDER — AMLODIPINE BESYLATE 5 MG PO TABS
5.0000 mg | ORAL_TABLET | Freq: Every evening | ORAL | Status: DC
  Administered 2024-04-19: 5 mg via ORAL
  Filled 2024-04-19: qty 1

## 2024-04-19 MED ORDER — MELATONIN 5 MG PO TABS: 5.0000 mg | ORAL_TABLET | Freq: Every evening | ORAL | Status: DC | PRN

## 2024-04-19 NOTE — ED Notes (Signed)
 Upon head to toe assessment the Pt knows person, Place, event, knows [place]  President not year.

## 2024-04-19 NOTE — ED Triage Notes (Addendum)
 Patient arrives via [facility] from dialysis c/c AMS, chest pain? Flank pain? Recently hospitalized at [facility] for septic arthritis. Patient states her back hurts. CBG 101. Repetitive answers, unable to answer orientation questions.

## 2024-04-19 NOTE — Transitions of Care (Post Inpatient/ED Visit) (Signed)
   04/19/2024  Name: [name] MRN: [id number] DOB: 06-Feb-1966  Today's TOC FU Call Status: Today's TOC FU Call Status:: Unsuccessful Call (1st Attempt) Unsuccessful Call (1st Attempt) Date: 04/19/24  Attempted to reach the patient regarding the most recent Inpatient/ED visit.  Follow Up Plan: Additional outreach attempts will be made to reach the patient to complete the Transitions of Care (Post Inpatient/ED visit) call.   Medford Balboa, BSN, RN [place]  VBCI - [employer] [facility] RN Care Manager [phone]

## 2024-04-19 NOTE — Consult Note (Addendum)
 CARDIOLOGY CONSULT NOTE- LATE ENTRY    Patient ID: [name]; [id number]; 08-12-1965   Admit date: 04/19/2024 Date of Consult: 04/19/2024  Primary Care Provider: Edman Meade PEDLAR, FNP Primary Cardiologist:  Primary Electrophysiologist:     Patient Profile:   [name] is a 58 y.o. female who is being seen today for the evaluation of chest pain at the request of Dr Melvenia.  History of Present Illness:   [name] is a 58 y/o F known to have ESRD DD, Hx of RP hematoma (unknown etiology) in 2024 s/p PRBC transfusion, paraplegia c/w neurogenic bladder s/p suprapubic catheter, chronic decubitus ulcers, chronic osteomyelitis of hip was brought from dialysis center for chest pain. Patient is AAO x 1. She denied chest pain to me and answered fine to all the questions. History per chart review. BP upon arrival was 190/94 mm Hg.Hs tropononins 160>>164. EKG showed NSR, RBBB. Recently discharged from [facility] on 04/17/2024, on IV antibiotics for septic arthritis of R hip.  Past Medical History:  Diagnosis Date   Abnormal uterine bleeding (AUB) 06/15/2014   Cancer (HCC)    uterine   ESRD on hemodialysis (HCC)    High blood pressure    Paraplegia (lower)    Seizure disorder (HCC)    Seizures (HCC)    Suprapubic catheter (HCC)    Urinary tract infection     Past Surgical History:  Procedure Laterality Date   APPLICATION OF WOUND VAC Right 09/13/2021   (approximately 1-46mos ago) pressure sore on right hip   BACK SURGERY     Pt stated before 2000   BIOPSY  12/03/2022   Procedure: BIOPSY;  Surgeon: [name], MD;  Location: [facility];  Service: Gastroenterology;;   ESOPHAGOGASTRODUODENOSCOPY N/A 09/20/2015   Procedure: ESOPHAGOGASTRODUODENOSCOPY (EGD);  Surgeon: [name], MD;  Location: [facility];  Service: Endoscopy;  Laterality: N/A;  730   ESOPHAGOGASTRODUODENOSCOPY (EGD) WITH PROPOFOL  N/A 12/03/2022   Procedure:  ESOPHAGOGASTRODUODENOSCOPY (EGD) WITH PROPOFOL ;  Surgeon: [name], MD;  Location: [facility];  Service: Gastroenterology;  Laterality: N/A;  1:15 pm, asa 3, pt knows to arrive at 10:30  dialysis pt, M,W & F   IR CATHETER TUBE CHANGE  04/02/2018   PERCUTANEOUS ENDOSCOPIC GASTROSTOMY (PEG) REMOVAL N/A 09/20/2015   Procedure: PERCUTANEOUS ENDOSCOPIC GASTROSTOMY (PEG) REMOVAL;  Surgeon: [name], MD;  Location: [facility];  Service: Endoscopy;  Laterality: N/A;   TEE WITHOUT CARDIOVERSION N/A 11/11/2022   Procedure: TRANSESOPHAGEAL ECHOCARDIOGRAM (TEE);  Surgeon: [name], MD;  Location: [facility];  Service: Cardiovascular;  Laterality: N/A;     Inpatient Medications: Scheduled Meds:  amLODipine   5 mg Oral QPM   [START ON 04/20/2024] aspirin  EC  81 mg Oral Daily   carvedilol   25 mg Oral BID   heparin   5,000 Units Subcutaneous Q8H   Continuous Infusions:  ceFEPime  (MAXIPIME ) IV 1 g (04/19/24 2001)   nitroGLYCERIN  5 mcg/min (04/19/24 1709)   PRN Meds: acetaminophen , melatonin, polyethylene glycol, prochlorperazine   Allergies:    Allergies  Allergen Reactions   Other Anaphylaxis    Dialyzer   Cefepime  Other (See Comments)    Severe AMS 09/2023 admission   Linezolid Other (See Comments)    Patient self-discontinued treatment due to GI intolerance. Taking it along with moxifloxacin   Moxifloxacin Other (See Comments)    Patient self-discontinued treatment due to GI intolerance. Taking it along with linezolid   Quinine Derivatives Other (See Comments)  Alters mental status   Vancomycin  Other (See Comments)    Unknown  Pt is tolerating this medication at HD   Azithromycin  Itching and Rash   Benadryl [Diphenhydramine Hcl (Sleep)] Hives   Daptomycin Hives   Piperacillin  Dermatitis   Tetracycline Itching    Able to tolerate Doxycycline .    Zosyn  [Piperacillin  Sod-Tazobactam So] Rash    Tolerated in 12/2023 admission    Social History:   Social  History   Socioeconomic History   Marital status: Single    Spouse name: Not on file   Number of children: 3   Years of education: 9 th   Highest education level: Not on file  Occupational History    Comment: Disabled  Tobacco Use   Smoking status: Never   Smokeless tobacco: Never  Vaping Use   Vaping status: Never Used  Substance and Sexual Activity   Alcohol use: Yes    Comment: Occassionally   Drug use: No   Sexual activity: Never  Other Topics Concern   Not on file  Social History Narrative   Patient lives with her son [name]). Patient is disabled.   Education 9th grade.   Right handed.   Caffeine - None    Social Drivers of Corporate Investment Banker Strain: Not on file  Food Insecurity: No Food Insecurity (04/19/2024)   Hunger Vital Sign    Worried About Running Out of Food in the Last Year: Never true    Ran Out of Food in the Last Year: Never true  Transportation Needs: No Transportation Needs (04/19/2024)   PRAPARE - Administrator, Civil Service (Medical): No    Lack of Transportation (Non-Medical): No  Physical Activity: Not on file  Stress: Not on file  Social Connections: Not on file  Intimate Partner Violence: Not At Risk (04/19/2024)   Humiliation, Afraid, Rape, and Kick questionnaire    Fear of Current or Ex-Partner: No    Emotionally Abused: No    Physically Abused: No    Sexually Abused: No    Family History:    Family History  Problem Relation Age of Onset   Cancer Mother    Hypertension Mother    Cancer Sister        breast and then spread everywhere.   Diabetes Paternal Grandmother    Hypertension Paternal Grandmother      ROS:  Please see the history of present illness.  ROS  All other ROS reviewed and negative.     Physical Exam/Data:   Vitals:   04/19/24 1530 04/19/24 1700 04/19/24 1710 04/19/24 1715  BP: (!) 182/92 (!) 190/94 (!) 180/86 (!) 176/91  Pulse: 96 90 91 89  Resp: (!) 21 16 18 11   Temp:       TempSrc:      SpO2: 98% 94% (!) 88% 92%  Weight:      Height:       No intake or output data in the 24 hours ending 04/19/24 2035 Filed Weights   04/19/24 1456  Weight: 56.6 kg   Body mass index is 23.56 kg/m.  General:  Well nourished, well developed, in no acute distress HEENT: normal Lymph: no adenopathy Neck: JVD unable to examine Endocrine:  No thryomegaly Vascular: No carotid bruits; FA pulses 2+ bilaterally without bruits  Cardiac:  normal S1, S2; RRR; no murmur  Lungs:  clear to auscultation bilaterally, no wheezing, rhonchi or rales  Abd: soft, nontender, no hepatomegaly  Ext: no edema,  decubitus ulcers present Musculoskeletal:  No deformities, paraplegia Skin: warm and dry  Neuro:  CNs 2-12 intact, no focal abnormalities noted Psych:  AAO x 1  Laboratory Data:  ChemistryNo results for input(s): NA, K, CL, CO2, GLUCOSE, BUN, CREATININE, CALCIUM , GFRNONAA, GFRAA, ANIONGAP in the last 168 hours.  Recent Labs  Lab 04/19/24 1518  PROT 7.3  ALBUMIN  3.6  AST 15  ALT 7  ALKPHOS 75  BILITOT 0.5   Hematology Recent Labs  Lab 04/19/24 1518  WBC 6.1  RBC 3.46*  HGB 9.5*  HCT 31.9*  MCV 92.2  MCH 27.5  MCHC 29.8*  RDW 18.1*  PLT 241   Cardiac EnzymesNo results for input(s): TROPONINI in the last 168 hours. No results for input(s): TROPIPOC in the last 168 hours.  BNPNo results for input(s): BNP, PROBNP in the last 168 hours.  DDimer No results for input(s): DDIMER in the last 168 hours.  Radiology/Studies:  CT CHEST ABDOMEN PELVIS WO CONTRAST Result Date: 04/19/2024 CLINICAL DATA:  Sepsis EXAM: CT CHEST, ABDOMEN AND PELVIS WITHOUT CONTRAST TECHNIQUE: Multidetector CT imaging of the chest, abdomen and pelvis was performed following the standard protocol without IV contrast. RADIATION DOSE REDUCTION: This exam was performed according to the departmental dose-optimization program which includes automated exposure control,  adjustment of the mA and/or kV according to patient size and/or use of iterative reconstruction technique. COMPARISON:  04/12/2024 FINDINGS: Of note, the lack of intravenous contrast limits evaluation of the solid organ parenchyma and vascularity. CT CHEST FINDINGS Cardiovascular: Unchanged moderate cardiomegaly with a small pericardial effusion. Scattered multi-vessel coronary atherosclerosis.No aortic aneurysm. Calcified aortic atherosclerosis throughout. Mediastinum/Nodes: No mediastinal mass.No mediastinal, hilar, or axillary lymphadenopathy. Lungs/Pleura: The midline trachea and bronchi are patent. No focal airspace consolidation, pleural effusion, or pneumothorax. Posterior bibasilar dependent atelectasis. CT ABDOMEN PELVIS FINDINGS Hepatobiliary: No mass.Small volume layering biliary sludge.No radiopaque stones or wall thickening of the gallbladder. No intrahepatic or extrahepatic biliary ductal dilation. Pancreas: No mass or main ductal dilation. No peripancreatic inflammation or fluid collection. Spleen: Normal size. No mass. Adrenals/Urinary Tract: No adrenal masses. Severe bilateral renal cortical atrophy, unchanged. A couple of small hypodensities are noted in both kidneys, likely small cyst. Moderate amount of gas within the right renal collecting system. No hydronephrosis or nephrolithiasis. Well-positioned suprapubic catheter terminating in the urinary bladder with multiple locules of gas noted. The urinary bladder is otherwise completely decompressed. Stomach/Bowel: The stomach is decompressed without focal abnormality. No small bowel wall thickening or inflammation. No small bowel obstruction.Normal appendix. Scattered colonic diverticulosis without changes of acute diverticulitis. Vascular/Lymphatic: No aortic aneurysm. Diffuse aortoiliac atherosclerosis. Unchanged 1.2 cm left external iliac chain lymph node, likely reactive. Reproductive: Age-related atrophy of the uterus and ovaries. No  concerning adnexal mass. No free pelvic fluid. Other: No pneumoperitoneum, ascites, or mesenteric inflammation. Musculoskeletal: Diffuse osteopenia. Chronic grade 2 anterolisthesis in the midthoracic spine with postsurgical fusion rod hardware noted spanning the thoracic spine. Multilevel degenerative disc disease noted. 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 The bony changes of the right femoral head are also unchanged. Similar appearance of a large decubitus ulcer overlying the proximal left femur and left ischium with underlying bony changes of chronic osteomyelitis. Ovoid, peripherally calcified collection overlying the sacrococcygeal spinal soft tissues is also unchanged. Decreased size of the right hip effusion. Right ischial decubitus ulcer is also unchanged. IMPRESSION: 1. Moderate amount of gas within the right renal collecting system, which may be due to reflux from the indwelling  suprapubic catheter. Correlation with urinalysis recommended to exclude early gas producing infection. 2. Scattered colonic diverticulosis without changes of acute diverticulitis. 3. No pneumonia, pulmonary edema, or pleural effusion. 4. Similar appearance of large decubitus ulcers, the largest overlies the left ischium and proximal left femur with underlying bony changes of chronic osteomyelitis. Electronically Signed   By: Rogelia Myers M.D.   On: 04/19/2024 17:39   CT HEAD WO CONTRAST Result Date: 04/19/2024 CLINICAL DATA:  Mental status change, unknown cause. EXAM: CT HEAD WITHOUT CONTRAST TECHNIQUE: Contiguous axial images were obtained from the base of the skull through the vertex without intravenous contrast. RADIATION DOSE REDUCTION: This exam was performed according to the departmental dose-optimization program which includes automated exposure control, adjustment of the mA and/or kV according to patient size and/or use of iterative reconstruction  technique. COMPARISON:  Head MRI 04/19/2024 FINDINGS: Brain: There is no evidence of an acute infarct, intracranial hemorrhage, mass, midline shift, or extra-axial fluid collection. Encephalomalacia is again noted in the posterior left frontal lobe. A small region of cortical hypodensity in the right parietal lobe corresponds to a suspected late subacute to chronic infarct on MRI. Hypodensities elsewhere in the cerebral white matter bilaterally are nonspecific but compatible with moderately age advanced chronic small vessel ischemic disease. Cerebral atrophy is moderately advanced for age. Vascular: Calcified atherosclerosis at the skull base. Skull: No acute fracture or suspicious lesion. Sinuses/Orbits: Trace bilateral mastoid fluid. Clear paranasal sinuses. Unremarkable orbits. Other: None. IMPRESSION: 1. No evidence of acute intracranial abnormality. 2. Moderately age advanced chronic small vessel ischemic disease and cerebral atrophy. 3. Chronic posterior left frontal infarct. Small late subacute to chronic right parietal infarct. Electronically Signed   By: Dasie Hamburg M.D.   On: 04/19/2024 17:12   MR BRAIN WO CONTRAST Result Date: 04/19/2024 CLINICAL DATA:  Mental status change, unknown cause. EXAM: MRI HEAD WITHOUT CONTRAST TECHNIQUE: Multiplanar, multiecho pulse sequences of the brain and surrounding structures were obtained without intravenous contrast. COMPARISON:  Head MRI 09/26/2023 FINDINGS: The examination is motion degraded, including severe motion on the axial FLAIR sequence and moderate motion on other sequences. Brain: No acute infarct, mass, midline shift, or extra-axial fluid collection is identified. Cerebral atrophy is moderately advanced for age. Encephalomalacia is again noted involving the posterior left frontal lobe and cingulate gyrus with associated chronic blood products and ex vacuo dilatation of the body of the left lateral ventricle. T2 hyperintensities elsewhere in the cerebral  white matter bilaterally are similar to the prior MRI and nonspecific but compatible with moderate chronic small vessel ischemic disease. A small region of cortical intrinsic T1 hyperintensity and susceptibility in the right parietal lobe is new from the prior MRI and suggestive of an interval late subacute to chronic infarct. Vascular: Major intracranial vascular flow voids are preserved. Skull and upper cervical spine: No suspicious marrow lesion. Sinuses/Orbits: Trace bilateral mastoid fluid. Clear paranasal sinuses. Unremarkable orbits. Other: None. IMPRESSION: 1. No acute intracranial abnormality identified on this motion degraded examination. 2. Moderate chronic small vessel ischemic disease and cerebral atrophy. 3. Chronic left frontal infarct. 4. Small late subacute to chronic right parietal infarct. Electronically Signed   By: Dasie Hamburg M.D.   On: 04/19/2024 17:10   DG Chest Port 1 View if patient is in a treatment room. Result Date: 04/19/2024 CLINICAL DATA:  Suspected sepsis.  Chest pain. EXAM: PORTABLE CHEST 1 VIEW COMPARISON:  04/12/2024 and CT chest 04/12/2024. FINDINGS: Patient is rotated. Trachea is midline. Heart is enlarged, stable. Thoracic aorta  is calcified. Vague opacification in the left lower lobe. Trace right pleural effusion. Minimal right base atelectasis. IMPRESSION: 1. Vague left lower lobe opacification may be due to atelectasis and/or pleural fluid. Difficult to exclude pneumonia. 2. Tiny right pleural effusion. Electronically Signed   By: Newell Eke M.D.   On: 04/19/2024 15:36    Assessment and Plan:   LATE ENTRY NOTE  Hypertensive emergency - Patient is oriented x 1. Not a reliable historian. She denied chest pain to me and kept answering fine to every question. History obtained from chart review. BP upon arrival was 190/94 mm Hg. - She received 2.5 hours of dialysis today. She had ?chest pain during dialysis per documentation. - Resume home medications. On  NTG drip. Management per primary team.  Myocardial injury - Hs troponins elevated, 160>>164. Prior Hs troponins from one month ago were 86>>86. - EKG showed NSR, RBBB. No ischemia. - Documentation of chest pain but patient is not oriented enough to provide me with any history - Trend troponins x 1 - Elevated troponins likely from HTN, acute encephalopathy, infection, decubitus ulcers etc., - If troponins trend up significantly, okay to start heparin  drip however caution advised due to recent imaging evidence of large R hip effusion and prior Hx of RP hematoma in 2024 with unknown etiology. CBC Q 8h if on heparin  drip. Only medical management. - Not a candidate for any invasive cardiac procedures due to AMS and chronic infections on antibiotics  Acute on chronic metabolic encephalopathy - Likely from underlying infectious etiology - Management per primary team  Septic arthritis of R hip - Dry tap. ID recommended to continue 6 weeks of IV antibiotics.  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  Paraplegia Neurogenic bladder s/p suprapubic catheterization - Management per primary team  ESRD DD - Management per primary team  Hx of RP hematoma in 2024, unknown etiology  20 minutes spent in reviewing complicated medical records. 30 minutes spent in reviewing >3 labs, imaging/reports, >3 labs, discussion and documentation. Recommendations were relayed to ED staff at 5:20 pm today.  For questions or updates, please contact [facility] Please consult [email or web address] for contact info under Cardiology/STEMI.   Signed, Jacqulene Huntley Priya Issak Goley, MD 04/19/2024 8:35 PM

## 2024-04-19 NOTE — Plan of Care (Signed)
  Problem: Clinical Measurements: Goal: Respiratory complications will improve Outcome: Progressing Goal: Cardiovascular complication will be avoided Outcome: Progressing   Problem: Coping: Goal: Level of anxiety will decrease Outcome: Progressing   Problem: Elimination: Goal: Will not experience complications related to bowel motility Outcome: Progressing Goal: Will not experience complications related to urinary retention Outcome: Progressing   Problem: Pain Managment: Goal: General experience of comfort will improve and/or be controlled Outcome: Progressing   Problem: Safety: Goal: Ability to remain free from injury will improve Outcome: Progressing

## 2024-04-19 NOTE — H&P (Incomplete)
 History and Physical  [name] FMW:[id number] DOB: 1965-09-20 DOA: 04/19/2024  Referring physician: Dr. Melvenia, EDP  PCP: Edman Meade PEDLAR, FNP  Outpatient Specialists: Infectious disease, wound care specialist. Patient coming from: Home through hemodialysis center.  Chief Complaint: Altered mental status.  HPI: [name] is a 58 y.o. female with medical history significant for paraplegia, ESRD on HD MWF, seizure disorder, chronic HFpEF, hypertension, chronic anemia, chronic pressure ulcers, neurogenic bladder with suprapubic catheter, chronic left femur osteomyelitis, prior urine cancer, who presents to the ER from hemodialysis center due to altered mental status.  To note, the patient was recently admitted a week ago for sepsis due to pyogenic arthritis of left hip and right hip septic arthritis at [facility], followed by infectious disease, with recommendation for 6 weeks of IV antibiotics and discharged 2 days prior to this presentation.  Per her daughter, her altered mental status has been present since she left the hospital 2 days ago.  Endorses chest pain centrally located and radiating to her right breast.  In the ER, blood pressure is uncontrolled, chest pain is improved with improvement of blood pressure.  No evidence of acute ischemia on twelve-lead EKG.  High-sensitivity troponin elevated and flat, 160, 164.  EDP discussed the case with cardiology, suspect demand ischemia in the setting of uncontrolled hypertension.  Recommended starting heparin  drip if troponin rises above 500.  Admitted by [facility], hospitalist service.  ED Course: Temperature 98.6.  BP 197/88, pulse 83, respiratory rate 31, O2 saturation 97% on room air.  Review of Systems: Review of systems as noted in the HPI. All other systems reviewed and are negative.   Past Medical History:  Diagnosis Date   Abnormal uterine bleeding (AUB) 06/15/2014   Cancer (HCC)    uterine   ESRD on  hemodialysis (HCC)    High blood pressure    Paraplegia (lower)    Seizure disorder (HCC)    Seizures (HCC)    Suprapubic catheter (HCC)    Urinary tract infection    Past Surgical History:  Procedure Laterality Date   APPLICATION OF WOUND VAC Right 09/13/2021   (approximately 1-48mos ago) pressure sore on right hip   BACK SURGERY     Pt stated before 2000   BIOPSY  12/03/2022   Procedure: BIOPSY;  Surgeon: [name], MD;  Location: [facility];  Service: Gastroenterology;;   ESOPHAGOGASTRODUODENOSCOPY N/A 09/20/2015   Procedure: ESOPHAGOGASTRODUODENOSCOPY (EGD);  Surgeon: [name], MD;  Location: [facility];  Service: Endoscopy;  Laterality: N/A;  730   ESOPHAGOGASTRODUODENOSCOPY (EGD) WITH PROPOFOL  N/A 12/03/2022   Procedure: ESOPHAGOGASTRODUODENOSCOPY (EGD) WITH PROPOFOL ;  Surgeon: [name], MD;  Location: [facility];  Service: Gastroenterology;  Laterality: N/A;  1:15 pm, asa 3, pt knows to arrive at 10:30  dialysis pt, M,W & F   IR CATHETER TUBE CHANGE  04/02/2018   PERCUTANEOUS ENDOSCOPIC GASTROSTOMY (PEG) REMOVAL N/A 09/20/2015   Procedure: PERCUTANEOUS ENDOSCOPIC GASTROSTOMY (PEG) REMOVAL;  Surgeon: [name], MD;  Location: [facility];  Service: Endoscopy;  Laterality: N/A;   TEE WITHOUT CARDIOVERSION N/A 11/11/2022   Procedure: TRANSESOPHAGEAL ECHOCARDIOGRAM (TEE);  Surgeon: [name], MD;  Location: [facility];  Service: Cardiovascular;  Laterality: N/A;    Social History:  reports that she has never smoked. She has never used smokeless tobacco. She reports current alcohol use. She reports that she does not use drugs.   Allergies  Allergen Reactions   Other Anaphylaxis  Dialyzer   Cefepime  Other (See Comments)    Severe AMS 09/2023 admission   Linezolid Other (See Comments)    Patient self-discontinued treatment due to GI intolerance. Taking it along with moxifloxacin   Moxifloxacin Other (See Comments)     Patient self-discontinued treatment due to GI intolerance. Taking it along with linezolid   Quinine Derivatives Other (See Comments)    Alters mental status   Vancomycin  Other (See Comments)    Unknown  Pt is tolerating this medication at HD   Azithromycin  Itching and Rash   Benadryl [Diphenhydramine Hcl (Sleep)] Hives   Daptomycin Hives   Piperacillin  Dermatitis   Tetracycline Itching    Able to tolerate Doxycycline .    Zosyn  [Piperacillin  Sod-Tazobactam So] Rash    Tolerated in 12/2023 admission    Family History  Problem Relation Age of Onset   Cancer Mother    Hypertension Mother    Cancer Sister        breast and then spread everywhere.   Diabetes Paternal Grandmother    Hypertension Paternal Grandmother       Prior to Admission medications   Medication Sig Start Date End Date Taking? Authorizing Provider  acetaminophen  (TYLENOL ) 500 MG tablet Take 1,000 mg by mouth every 6 (six) hours as needed for moderate pain.    [provider]  albuterol  (PROVENTIL ) (2.5 MG/3ML) 0.083% nebulizer solution Take 3 mLs (2.5 mg total) by nebulization every 4 (four) hours as needed for wheezing or shortness of breath. 04/15/23 04/14/24  Pearlean Manus, MD  amLODipine  (NORVASC ) 5 MG tablet Take 5 mg by mouth every evening. 02/09/24   [provider]  B Complex-C-Folic Acid  (DIALYVITE TABLET) TABS Take 1 tablet by mouth daily. 02/27/24   [provider]  carvedilol  (COREG ) 25 MG tablet Take 25 mg by mouth 2 (two) times daily. 02/03/24   [provider]  cyclobenzaprine  (FLEXERIL ) 10 MG tablet Take 10 mg by mouth 3 (three) times daily as needed. 02/26/24   [provider]  Darbepoetin Alfa  (ARANESP ) 200 MCG/0.4ML SOSY injection Inject 0.4 mLs (200 mcg total) into the skin every Monday at 6 PM. 03/15/24   Ricky Fines, MD  fluconazole  (DIFLUCAN ) 100 MG tablet Take 200 mg by mouth daily. 01/29/24   [provider]  gabapentin  (NEURONTIN ) 100 MG  capsule Take 1 capsule (100 mg total) by mouth 2 (two) times daily. 03/17/24   Tobie Suzzane POUR, MD  hydrocortisone  2.5 % cream Apply 1 Application topically 2 (two) times daily. 01/29/24   [provider]  hydrOXYzine  (ATARAX ) 10 MG tablet Take 10 mg by mouth every 8 (eight) hours as needed for itching. 01/29/24   [provider]  irbesartan  (AVAPRO ) 150 MG tablet Take 150 mg by mouth every evening. 02/03/24   [provider]  levETIRAcetam  (KEPPRA ) 1000 MG tablet Take 1,000 mg by mouth at bedtime.    [provider]  levETIRAcetam  (KEPPRA ) 500 MG tablet TAKE (2) TABLETS BY MOUTH ONCE A DAY.TAKE AN ADDITIONAL TABLET AFTER DIALYSIS ON MONDAY,WEDNESDAY,AND FRIDAY EVENING. 03/18/24   Bacchus, [name] Z, FNP  lidocaine -prilocaine  (EMLA ) cream Apply 1 Application topically 3 (three) times a week. 06/04/22   [provider]  ondansetron  (ZOFRAN ) 4 MG tablet Take 1 tablet (4 mg total) by mouth every 6 (six) hours. 03/10/24   Idol, Julie, PA-C  ondansetron  (ZOFRAN -ODT) 4 MG disintegrating tablet 4mg  ODT q4 hours prn nausea/vomit 03/09/24   Zammit, Joseph, MD  pantoprazole  (PROTONIX ) 20 MG tablet Take 1  tablet (20 mg total) by mouth daily. 03/09/24   Suzette Pac, MD  senna-docusate (SENOKOT-S) 8.6-50 MG tablet Take 2 tablets by mouth at bedtime. 11/24/23   Pearlean Manus, MD    Physical Exam: BP (!) 176/91   Pulse 89   Temp 98 F (36.7 C) (Oral)   Resp 11   Ht 5' 1 (1.549 m)   Wt 56.6 kg   LMP  (LMP Unknown)   SpO2 92%   BMI 23.56 kg/m   General: 58 y.o. year-old female frail, weak appearing.  Alert and oriented x3. Cardiovascular: Regular rate and rhythm with no rubs or gallops.  No thyromegaly or JVD noted.  No lower extremity edema bilaterally. Respiratory: Clear to auscultation with no wheezes or rales. Good inspiratory effort. Abdomen: Soft nontender nondistended with normal bowel sounds x4 quadrants. Muskuloskeletal: No cyanosis or clubbing noted  bilaterally Neuro: CN II-XII intact, strength, sensation, reflexes Skin: Multiple pressure ulcers. Psychiatry: Judgement and insight appear normal. Mood is appropriate for condition and setting          Labs on Admission:  Basic Metabolic Panel: Recent Labs  Lab 04/19/24 1518  MG 1.9   Liver Function Tests: Recent Labs  Lab 04/19/24 1518  AST 15  ALT 7  ALKPHOS 75  BILITOT 0.5  PROT 7.3  ALBUMIN  3.6   No results for input(s): LIPASE, AMYLASE in the last 168 hours. Recent Labs  Lab 04/19/24 1518  AMMONIA <13   CBC: Recent Labs  Lab 04/19/24 1518  WBC 6.1  NEUTROABS 3.9  HGB 9.5*  HCT 31.9*  MCV 92.2  PLT 241   Cardiac Enzymes: No results for input(s): CKTOTAL, CKMB, CKMBINDEX, TROPONINI in the last 168 hours.  BNP (last 3 results) Recent Labs    06/10/23 0941 09/22/23 1010 11/20/23 1250  BNP >4,500.0* >4,500.0* 452.0*    ProBNP (last 3 results) Recent Labs    04/12/24 1127  PROBNP >35,000.0*    CBG: Recent Labs  Lab 04/19/24 1528  GLUCAP 102*    Radiological Exams on Admission: CT CHEST ABDOMEN PELVIS WO CONTRAST Result Date: 04/19/2024 CLINICAL DATA:  Sepsis EXAM: CT CHEST, ABDOMEN AND PELVIS WITHOUT CONTRAST TECHNIQUE: Multidetector CT imaging of the chest, abdomen and pelvis was performed following the standard protocol without IV contrast. RADIATION DOSE REDUCTION: This exam was performed according to the departmental dose-optimization program which includes automated exposure control, adjustment of the mA and/or kV according to patient size and/or use of iterative reconstruction technique. COMPARISON:  04/12/2024 FINDINGS: Of note, the lack of intravenous contrast limits evaluation of the solid organ parenchyma and vascularity. CT CHEST FINDINGS Cardiovascular: Unchanged moderate cardiomegaly with a small pericardial effusion. Scattered multi-vessel coronary atherosclerosis.No aortic aneurysm. Calcified aortic atherosclerosis  throughout. Mediastinum/Nodes: No mediastinal mass.No mediastinal, hilar, or axillary lymphadenopathy. Lungs/Pleura: The midline trachea and bronchi are patent. No focal airspace consolidation, pleural effusion, or pneumothorax. Posterior bibasilar dependent atelectasis. CT ABDOMEN PELVIS FINDINGS Hepatobiliary: No mass.Small volume layering biliary sludge.No radiopaque stones or wall thickening of the gallbladder. No intrahepatic or extrahepatic biliary ductal dilation. Pancreas: No mass or main ductal dilation. No peripancreatic inflammation or fluid collection. Spleen: Normal size. No mass. Adrenals/Urinary Tract: No adrenal masses. Severe bilateral renal cortical atrophy, unchanged. A couple of small hypodensities are noted in both kidneys, likely small cyst. Moderate amount of gas within the right renal collecting system. No hydronephrosis or nephrolithiasis. Well-positioned suprapubic catheter terminating in the urinary bladder with multiple locules of gas noted. The urinary bladder is otherwise completely  decompressed. Stomach/Bowel: The stomach is decompressed without focal abnormality. No small bowel wall thickening or inflammation. No small bowel obstruction.Normal appendix. Scattered colonic diverticulosis without changes of acute diverticulitis. Vascular/Lymphatic: No aortic aneurysm. Diffuse aortoiliac atherosclerosis. Unchanged 1.2 cm left external iliac chain lymph node, likely reactive. Reproductive: Age-related atrophy of the uterus and ovaries. No concerning adnexal mass. No free pelvic fluid. Other: No pneumoperitoneum, ascites, or mesenteric inflammation. Musculoskeletal: Diffuse osteopenia. Chronic grade 2 anterolisthesis in the midthoracic spine with postsurgical fusion rod hardware noted spanning the thoracic spine. Multilevel degenerative disc disease noted. 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 The bony changes of the  right femoral head are also unchanged. Similar appearance of a large decubitus ulcer overlying the proximal left femur and left ischium with underlying bony changes of chronic osteomyelitis. Ovoid, peripherally calcified collection overlying the sacrococcygeal spinal soft tissues is also unchanged. Decreased size of the right hip effusion. Right ischial decubitus ulcer is also unchanged. IMPRESSION: 1. Moderate amount of gas within the right renal collecting system, which may be due to reflux from the indwelling suprapubic catheter. Correlation with urinalysis recommended to exclude early gas producing infection. 2. Scattered colonic diverticulosis without changes of acute diverticulitis. 3. No pneumonia, pulmonary edema, or pleural effusion. 4. Similar appearance of large decubitus ulcers, the largest overlies the left ischium and proximal left femur with underlying bony changes of chronic osteomyelitis. Electronically Signed   By: Rogelia Myers M.D.   On: 04/19/2024 17:39   CT HEAD WO CONTRAST Result Date: 04/19/2024 CLINICAL DATA:  Mental status change, unknown cause. EXAM: CT HEAD WITHOUT CONTRAST TECHNIQUE: Contiguous axial images were obtained from the base of the skull through the vertex without intravenous contrast. RADIATION DOSE REDUCTION: This exam was performed according to the departmental dose-optimization program which includes automated exposure control, adjustment of the mA and/or kV according to patient size and/or use of iterative reconstruction technique. COMPARISON:  Head MRI 04/19/2024 FINDINGS: Brain: There is no evidence of an acute infarct, intracranial hemorrhage, mass, midline shift, or extra-axial fluid collection. Encephalomalacia is again noted in the posterior left frontal lobe. A small region of cortical hypodensity in the right parietal lobe corresponds to a suspected late subacute to chronic infarct on MRI. Hypodensities elsewhere in the cerebral white matter bilaterally are  nonspecific but compatible with moderately age advanced chronic small vessel ischemic disease. Cerebral atrophy is moderately advanced for age. Vascular: Calcified atherosclerosis at the skull base. Skull: No acute fracture or suspicious lesion. Sinuses/Orbits: Trace bilateral mastoid fluid. Clear paranasal sinuses. Unremarkable orbits. Other: None. IMPRESSION: 1. No evidence of acute intracranial abnormality. 2. Moderately age advanced chronic small vessel ischemic disease and cerebral atrophy. 3. Chronic posterior left frontal infarct. Small late subacute to chronic right parietal infarct. Electronically Signed   By: Dasie Hamburg M.D.   On: 04/19/2024 17:12   MR BRAIN WO CONTRAST Result Date: 04/19/2024 CLINICAL DATA:  Mental status change, unknown cause. EXAM: MRI HEAD WITHOUT CONTRAST TECHNIQUE: Multiplanar, multiecho pulse sequences of the brain and surrounding structures were obtained without intravenous contrast. COMPARISON:  Head MRI 09/26/2023 FINDINGS: The examination is motion degraded, including severe motion on the axial FLAIR sequence and moderate motion on other sequences. Brain: No acute infarct, mass, midline shift, or extra-axial fluid collection is identified. Cerebral atrophy is moderately advanced for age. Encephalomalacia is again noted involving the posterior left frontal lobe and cingulate gyrus with associated chronic blood products and ex vacuo dilatation of the body of the  left lateral ventricle. T2 hyperintensities elsewhere in the cerebral white matter bilaterally are similar to the prior MRI and nonspecific but compatible with moderate chronic small vessel ischemic disease. A small region of cortical intrinsic T1 hyperintensity and susceptibility in the right parietal lobe is new from the prior MRI and suggestive of an interval late subacute to chronic infarct. Vascular: Major intracranial vascular flow voids are preserved. Skull and upper cervical spine: No suspicious marrow  lesion. Sinuses/Orbits: Trace bilateral mastoid fluid. Clear paranasal sinuses. Unremarkable orbits. Other: None. IMPRESSION: 1. No acute intracranial abnormality identified on this motion degraded examination. 2. Moderate chronic small vessel ischemic disease and cerebral atrophy. 3. Chronic left frontal infarct. 4. Small late subacute to chronic right parietal infarct. Electronically Signed   By: Dasie Hamburg M.D.   On: 04/19/2024 17:10   DG Chest Port 1 View if patient is in a treatment room. Result Date: 04/19/2024 CLINICAL DATA:  Suspected sepsis.  Chest pain. EXAM: PORTABLE CHEST 1 VIEW COMPARISON:  04/12/2024 and CT chest 04/12/2024. FINDINGS: Patient is rotated. Trachea is midline. Heart is enlarged, stable. Thoracic aorta is calcified. Vague opacification in the left lower lobe. Trace right pleural effusion. Minimal right base atelectasis. IMPRESSION: 1. Vague left lower lobe opacification may be due to atelectasis and/or pleural fluid. Difficult to exclude pneumonia. 2. Tiny right pleural effusion. Electronically Signed   By: Newell Eke M.D.   On: 04/19/2024 15:36    EKG: I independently viewed the EKG done and my findings are as followed: Sinus rhythm rate of 96.  QTc 459.  Assessment/Plan Present on Admission:  Chest pain  Principal Problem:   Chest pain  Atypical chest pain, improved. Elevated troponin, suspect demand ischemia High-sensitivity troponin 160, 164 Received full dose aspirin  Cardiology consulted by EDP Follow transthoracic echocardiogram. Continue aspirin  Currently on nitro drip  Uncontrolled hypertension Started on nitro drip in the ER Closely monitor vital signs  Pyogenic arthritis of left hip and right hip septic arthritis, POA Chronic left femur osteomyelitis, POA Diagnosed at outside facility, [facility] Resume home IV vancomycin  and IV cefepime , end treatment date 05/14/2024. Follow CRP and sed rate Follow urine culture and peripheral blood  cultures x 2 Monitor fever curve and WBCs  ESRD on HD MWF Resume home hemodialysis Nephrology consulted by EDP  Paraplegia with multiple pressure ulcers Wound care specialist consulted Continue local wound care with wound care specialist guideline  Seizure disorder Resume home Keppra  Seizure precautions. IV Ativan  as needed for breakthrough seizures.  Neurogenic bladder with suprapubic catheter Recently discharged from [facility] with [facility] 2 days ago, unclear if the catheter was changed there. Monitor urine output.   Critical care time: 55 minutes.   DVT prophylaxis: Subcu heparin  3 times daily  Code Status: Full code.  Family Communication: None at bedside.  Disposition Plan: Admitted to stepdown unit.  Consults called: Cardiology and nephrology consulted by EDP.  Admission status: Inpatient status.   Status is: Inpatient The patient requires at least 2 midnights for further evaluation and treatment of present condition.   Terry LOISE Hurst MD [facility] Pager [phone]  If 7PM-7AM, please contact night-coverage [email or web address] Password [email or web address]  04/19/2024, 8:06 PM

## 2024-04-19 NOTE — ED Provider Notes (Addendum)
 [place] [facility] DE[facility] AT [facility] Provider Note   CSN: [id number] Arrival date & time: 04/19/24  1442     Patient presents with: Altered Mental Status   [name] is a 58 y.o. female.    Altered Mental Status Presenting symptoms: confusion   Patient presents for chest pain.  Medical history includes paraplegia, ESRD, seizures, HTN, anemia, CHF, chronic pressure ulcers.  She was admitted a week ago for sepsis with concern of right hip source.  At [facility], IR guided aspiration yielded a dry tap.  Recommendations were for 6 weeks of IV antibiotics.  She was discharged from the hospital 2 days ago.  She arrives today via EMS from dialysis for concerns of chest pain.  With EMS, she was alert and oriented x 1.  Daughter reports that she has had similar altered mental status since she left the hospital 2 days ago.  Patient endorses ongoing chest pain.  Per EMS, she received 2.5 hours of dialysis prior to transport.     Prior to Admission medications   Medication Sig Start Date End Date Taking? Authorizing Provider  acetaminophen  (TYLENOL ) 500 MG tablet Take 1,000 mg by mouth every 6 (six) hours as needed for moderate pain.    [provider]  albuterol  (PROVENTIL ) (2.5 MG/3ML) 0.083% nebulizer solution Take 3 mLs (2.5 mg total) by nebulization every 4 (four) hours as needed for wheezing or shortness of breath. 04/15/23 04/14/24  Pearlean Manus, MD  amLODipine  (NORVASC ) 5 MG tablet Take 5 mg by mouth every evening. 02/09/24   [provider]  B Complex-C-Folic Acid  (DIALYVITE TABLET) TABS Take 1 tablet by mouth daily. 02/27/24   [provider]  carvedilol  (COREG ) 25 MG tablet Take 25 mg by mouth 2 (two) times daily. 02/03/24   [provider]  cyclobenzaprine  (FLEXERIL ) 10 MG tablet Take 10 mg by mouth 3 (three) times daily as needed. 02/26/24   [provider]  Darbepoetin Alfa  (ARANESP ) 200 MCG/0.4ML SOSY  injection Inject 0.4 mLs (200 mcg total) into the skin every Monday at 6 PM. 03/15/24   Ricky Fines, MD  fluconazole  (DIFLUCAN ) 100 MG tablet Take 200 mg by mouth daily. 01/29/24   [provider]  gabapentin  (NEURONTIN ) 100 MG capsule Take 1 capsule (100 mg total) by mouth 2 (two) times daily. 03/17/24   Tobie Suzzane POUR, MD  hydrocortisone  2.5 % cream Apply 1 Application topically 2 (two) times daily. 01/29/24   [provider]  hydrOXYzine  (ATARAX ) 10 MG tablet Take 10 mg by mouth every 8 (eight) hours as needed for itching. 01/29/24   [provider]  irbesartan  (AVAPRO ) 150 MG tablet Take 150 mg by mouth every evening. 02/03/24   [provider]  levETIRAcetam  (KEPPRA ) 1000 MG tablet Take 1,000 mg by mouth at bedtime.    [provider]  levETIRAcetam  (KEPPRA ) 500 MG tablet TAKE (2) TABLETS BY MOUTH ONCE A DAY.TAKE AN ADDITIONAL TABLET AFTER DIALYSIS ON MONDAY,WEDNESDAY,AND FRIDAY EVENING. 03/18/24   Bacchus, [name] Z, FNP  lidocaine -prilocaine  (EMLA ) cream Apply 1 Application topically 3 (three) times a week. 06/04/22   [provider]  ondansetron  (ZOFRAN ) 4 MG tablet Take 1 tablet (4 mg total) by mouth every 6 (six) hours. 03/10/24   Idol, Julie, PA-C  ondansetron  (ZOFRAN -ODT) 4 MG disintegrating tablet 4mg  ODT q4 hours prn nausea/vomit 03/09/24   Zammit, Joseph, MD  pantoprazole  (PROTONIX ) 20 MG tablet Take 1 tablet (20 mg total) by mouth daily. 03/09/24  Suzette Pac, MD  senna-docusate (SENOKOT-S) 8.6-50 MG tablet Take 2 tablets by mouth at bedtime. 11/24/23   Pearlean Manus, MD    Allergies: Other, Cefepime , Linezolid, Moxifloxacin, Quinine derivatives, Vancomycin , Azithromycin , Benadryl [diphenhydramine hcl (sleep)], Daptomycin, Piperacillin , Tetracycline, and Zosyn  [piperacillin  sod-tazobactam so]    Review of Systems  Cardiovascular:  Positive for chest pain.  Musculoskeletal:  Positive for back pain.  Psychiatric/Behavioral:   Positive for confusion.   All other systems reviewed and are negative.   Updated Vital Signs BP (!) 176/91   Pulse 89   Temp 98 F (36.7 C) (Oral)   Resp 11   Ht 5' 1 (1.549 m)   Wt 56.6 kg   LMP  (LMP Unknown)   SpO2 92%   BMI 23.56 kg/m   Physical Exam Vitals and nursing note reviewed.  Constitutional:      General: She is not in acute distress.    Appearance: Normal appearance. She is well-developed. She is ill-appearing (Chronically). She is not toxic-appearing or diaphoretic.  HENT:     Head: Normocephalic and atraumatic.     Right Ear: External ear normal.     Left Ear: External ear normal.     Nose: Nose normal.     Mouth/Throat:     Mouth: Mucous membranes are moist.  Eyes:     Extraocular Movements: Extraocular movements intact.     Conjunctiva/sclera: Conjunctivae normal.  Cardiovascular:     Rate and Rhythm: Normal rate and regular rhythm.     Heart sounds: No murmur heard. Pulmonary:     Effort: Pulmonary effort is normal. No respiratory distress.     Breath sounds: Normal breath sounds. No wheezing or rales.  Chest:     Chest wall: Tenderness present.  Abdominal:     General: There is no distension.     Palpations: Abdomen is soft.     Tenderness: There is abdominal tenderness.  Musculoskeletal:        General: No swelling. Normal range of motion.     Cervical back: Normal range of motion and neck supple.     Comments: Chronic buttock wounds  Skin:    General: Skin is warm and dry.     Coloration: Skin is not jaundiced or pale.  Neurological:     General: No focal deficit present.     Mental Status: She is alert. She is disoriented.  Psychiatric:        Mood and Affect: Mood normal.        Behavior: Behavior normal.     (all labs ordered are listed, but only abnormal results are displayed) Labs Reviewed  CBC WITH DIFFERENTIAL/PLATELET - Abnormal; Notable for the following components:      Result Value   RBC 3.46 (*)    Hemoglobin 9.5 (*)     HCT 31.9 (*)    MCHC 29.8 (*)    RDW 18.1 (*)    All other components within normal limits  URINALYSIS, W/ REFLEX TO CULTURE (INFECTION SUSPECTED) - Abnormal; Notable for the following components:   APPearance CLOUDY (*)    Glucose, UA >=500 (*)    Hgb urine dipstick SMALL (*)    Ketones, ur 5 (*)    Protein, ur >=300 (*)    Leukocytes,Ua LARGE (*)    Bacteria, UA RARE (*)    Non Squamous Epithelial 6-10 (*)    All other components within normal limits  BLOOD GAS, VENOUS - Abnormal; Notable for the following components:  pH, Ven 7.49 (*)    pCO2, Ven 39 (*)    pO2, Ven 49 (*)    Bicarbonate 29.8 (*)    Acid-Base Excess 5.8 (*)    All other components within normal limits  CBG MONITORING, ED - Abnormal; Notable for the following components:   Glucose-Capillary 102 (*)    All other components within normal limits  TROPONIN T, HIGH SENSITIVITY - Abnormal; Notable for the following components:   Troponin T High Sensitivity 160 (*)    All other components within normal limits  TROPONIN T, HIGH SENSITIVITY - Abnormal; Notable for the following components:   Troponin T High Sensitivity 164 (*)    All other components within normal limits  CULTURE, BLOOD (ROUTINE X 2)  CULTURE, BLOOD (ROUTINE X 2)  URINE CULTURE  LACTIC ACID, PLASMA  LACTIC ACID, PLASMA  PROTIME-INR  AMMONIA  HEPATIC FUNCTION PANEL  MAGNESIUM   CBC  LEVETIRACETAM  LEVEL    EKG: EKG Interpretation Date/Time:  Monday April 19 2024 14:55:48 EST Ventricular Rate:  96 PR Interval:  165 QRS Duration:  115 QT Interval:  363 QTC Calculation: 459 R Axis:   -73  Text Interpretation: Sinus rhythm Probable left atrial enlargement Incomplete RBBB and LAFB Probable anterior infarct, age indeterminate Confirmed by Melvenia Motto [id number]) on 04/19/2024 4:31:49 PM  Radiology: CT CHEST ABDOMEN PELVIS WO CONTRAST Result Date: 04/19/2024 CLINICAL DATA:  Sepsis EXAM: CT CHEST, ABDOMEN AND PELVIS WITHOUT CONTRAST  TECHNIQUE: Multidetector CT imaging of the chest, abdomen and pelvis was performed following the standard protocol without IV contrast. RADIATION DOSE REDUCTION: This exam was performed according to the departmental dose-optimization program which includes automated exposure control, adjustment of the mA and/or kV according to patient size and/or use of iterative reconstruction technique. COMPARISON:  04/12/2024 FINDINGS: Of note, the lack of intravenous contrast limits evaluation of the solid organ parenchyma and vascularity. CT CHEST FINDINGS Cardiovascular: Unchanged moderate cardiomegaly with a small pericardial effusion. Scattered multi-vessel coronary atherosclerosis.No aortic aneurysm. Calcified aortic atherosclerosis throughout. Mediastinum/Nodes: No mediastinal mass.No mediastinal, hilar, or axillary lymphadenopathy. Lungs/Pleura: The midline trachea and bronchi are patent. No focal airspace consolidation, pleural effusion, or pneumothorax. Posterior bibasilar dependent atelectasis. CT ABDOMEN PELVIS FINDINGS Hepatobiliary: No mass.Small volume layering biliary sludge.No radiopaque stones or wall thickening of the gallbladder. No intrahepatic or extrahepatic biliary ductal dilation. Pancreas: No mass or main ductal dilation. No peripancreatic inflammation or fluid collection. Spleen: Normal size. No mass. Adrenals/Urinary Tract: No adrenal masses. Severe bilateral renal cortical atrophy, unchanged. A couple of small hypodensities are noted in both kidneys, likely small cyst. Moderate amount of gas within the right renal collecting system. No hydronephrosis or nephrolithiasis. Well-positioned suprapubic catheter terminating in the urinary bladder with multiple locules of gas noted. The urinary bladder is otherwise completely decompressed. Stomach/Bowel: The stomach is decompressed without focal abnormality. No small bowel wall thickening or inflammation. No small bowel obstruction.Normal appendix. Scattered  colonic diverticulosis without changes of acute diverticulitis. Vascular/Lymphatic: No aortic aneurysm. Diffuse aortoiliac atherosclerosis. Unchanged 1.2 cm left external iliac chain lymph node, likely reactive. Reproductive: Age-related atrophy of the uterus and ovaries. No concerning adnexal mass. No free pelvic fluid. Other: No pneumoperitoneum, ascites, or mesenteric inflammation. Musculoskeletal: Diffuse osteopenia. Chronic grade 2 anterolisthesis in the midthoracic spine with postsurgical fusion rod hardware noted spanning the thoracic spine. Multilevel degenerative disc disease noted. 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 The bony changes of the right femoral head are also unchanged. Similar appearance of a  large decubitus ulcer overlying the proximal left femur and left ischium with underlying bony changes of chronic osteomyelitis. Ovoid, peripherally calcified collection overlying the sacrococcygeal spinal soft tissues is also unchanged. Decreased size of the right hip effusion. Right ischial decubitus ulcer is also unchanged. IMPRESSION: 1. Moderate amount of gas within the right renal collecting system, which may be due to reflux from the indwelling suprapubic catheter. Correlation with urinalysis recommended to exclude early gas producing infection. 2. Scattered colonic diverticulosis without changes of acute diverticulitis. 3. No pneumonia, pulmonary edema, or pleural effusion. 4. Similar appearance of large decubitus ulcers, the largest overlies the left ischium and proximal left femur with underlying bony changes of chronic osteomyelitis. Electronically Signed   By: Rogelia Myers M.D.   On: 04/19/2024 17:39   CT HEAD WO CONTRAST Result Date: 04/19/2024 CLINICAL DATA:  Mental status change, unknown cause. EXAM: CT HEAD WITHOUT CONTRAST TECHNIQUE: Contiguous axial images were obtained from the base of the skull through the  vertex without intravenous contrast. RADIATION DOSE REDUCTION: This exam was performed according to the departmental dose-optimization program which includes automated exposure control, adjustment of the mA and/or kV according to patient size and/or use of iterative reconstruction technique. COMPARISON:  Head MRI 04/19/2024 FINDINGS: Brain: There is no evidence of an acute infarct, intracranial hemorrhage, mass, midline shift, or extra-axial fluid collection. Encephalomalacia is again noted in the posterior left frontal lobe. A small region of cortical hypodensity in the right parietal lobe corresponds to a suspected late subacute to chronic infarct on MRI. Hypodensities elsewhere in the cerebral white matter bilaterally are nonspecific but compatible with moderately age advanced chronic small vessel ischemic disease. Cerebral atrophy is moderately advanced for age. Vascular: Calcified atherosclerosis at the skull base. Skull: No acute fracture or suspicious lesion. Sinuses/Orbits: Trace bilateral mastoid fluid. Clear paranasal sinuses. Unremarkable orbits. Other: None. IMPRESSION: 1. No evidence of acute intracranial abnormality. 2. Moderately age advanced chronic small vessel ischemic disease and cerebral atrophy. 3. Chronic posterior left frontal infarct. Small late subacute to chronic right parietal infarct. Electronically Signed   By: Dasie Hamburg M.D.   On: 04/19/2024 17:12   MR BRAIN WO CONTRAST Result Date: 04/19/2024 CLINICAL DATA:  Mental status change, unknown cause. EXAM: MRI HEAD WITHOUT CONTRAST TECHNIQUE: Multiplanar, multiecho pulse sequences of the brain and surrounding structures were obtained without intravenous contrast. COMPARISON:  Head MRI 09/26/2023 FINDINGS: The examination is motion degraded, including severe motion on the axial FLAIR sequence and moderate motion on other sequences. Brain: No acute infarct, mass, midline shift, or extra-axial fluid collection is identified. Cerebral  atrophy is moderately advanced for age. Encephalomalacia is again noted involving the posterior left frontal lobe and cingulate gyrus with associated chronic blood products and ex vacuo dilatation of the body of the left lateral ventricle. T2 hyperintensities elsewhere in the cerebral white matter bilaterally are similar to the prior MRI and nonspecific but compatible with moderate chronic small vessel ischemic disease. A small region of cortical intrinsic T1 hyperintensity and susceptibility in the right parietal lobe is new from the prior MRI and suggestive of an interval late subacute to chronic infarct. Vascular: Major intracranial vascular flow voids are preserved. Skull and upper cervical spine: No suspicious marrow lesion. Sinuses/Orbits: Trace bilateral mastoid fluid. Clear paranasal sinuses. Unremarkable orbits. Other: None. IMPRESSION: 1. No acute intracranial abnormality identified on this motion degraded examination. 2. Moderate chronic small vessel ischemic disease and cerebral atrophy. 3. Chronic left frontal infarct. 4. Small late subacute to chronic right  parietal infarct. Electronically Signed   By: Dasie Hamburg M.D.   On: 04/19/2024 17:10   DG Chest Port 1 View if patient is in a treatment room. Result Date: 04/19/2024 CLINICAL DATA:  Suspected sepsis.  Chest pain. EXAM: PORTABLE CHEST 1 VIEW COMPARISON:  04/12/2024 and CT chest 04/12/2024. FINDINGS: Patient is rotated. Trachea is midline. Heart is enlarged, stable. Thoracic aorta is calcified. Vague opacification in the left lower lobe. Trace right pleural effusion. Minimal right base atelectasis. IMPRESSION: 1. Vague left lower lobe opacification may be due to atelectasis and/or pleural fluid. Difficult to exclude pneumonia. 2. Tiny right pleural effusion. Electronically Signed   By: Newell Eke M.D.   On: 04/19/2024 15:36     Procedures   Medications Ordered in the ED  nitroGLYCERIN  50 mg in dextrose  5 % 250 mL (0.2 mg/mL)  infusion (5 mcg/min Intravenous New Bag/Given 04/19/24 1709)  sodium chloride  flush (NS) 0.9 % injection 3 mL (3 mLs Intravenous Given 04/19/24 1528)  levETIRAcetam  (KEPPRA ) undiluted injection 500 mg (500 mg Intravenous Given 04/19/24 1528)  aspirin  chewable tablet 324 mg (324 mg Oral Given 04/19/24 1703)  nitroGLYCERIN  (NITROGLYN) 2 % ointment 1 inch (1 inch Topical Given 04/19/24 1702)  fentaNYL  (SUBLIMAZE ) injection 50 mcg (50 mcg Intravenous Given 04/19/24 1704)                                    Medical Decision Making Amount and/or Complexity of Data Reviewed Labs: ordered. Radiology: ordered.  Risk OTC drugs. Prescription drug management. Decision regarding hospitalization.   This patient presents to the ED for concern of chest pain, this involves an extensive number of treatment options, and is a complaint that carries with it a high risk of complications and morbidity.  The differential diagnosis includes ACS, pericarditis, GERD, pneumonia, other infection   Co morbidities / Chronic conditions that complicate the patient evaluation  paraplegia, ESRD, seizures, HTN, anemia, CHF, chronic pressure ulcers   Additional history obtained:  Additional history obtained from EMR External records from outside source obtained and reviewed including N/A   Lab Tests:  I Ordered, and personally interpreted labs.  The pertinent results include: Baseline hemoglobin, no leukocytosis, normal lactate, troponin increased from baseline.  Urinalysis shows bacteriuria and pyuria consistent with colonization and/or infection   Imaging Studies ordered:  I ordered imaging studies including chest x-ray, CT of head, chest, abdomen, pelvis, MRI brain I independently visualized and interpreted imaging which showed no acute findings on brain imaging.  Age-indeterminate right parietal infarct is new since prior study.  On CT of chest, abdomen, pelvis, there is gas in right renal collecting  system consistent with reflux from suprapubic catheter versus early gas producing infection. I agree with the radiologist interpretation   Cardiac Monitoring: / EKG:  The patient was maintained on a cardiac monitor.  I personally viewed and interpreted the cardiac monitored which showed an underlying rhythm of: Sinus rhythm   Problem List / ED Course / Critical interventions / Medication management  Patient presenting for chest pain.  There is also concern of altered mental status.  On arrival in the ED, she is oriented only to self.  She endorses ongoing chest pain.  Chest pain is reproducible with palpation.  She has tenderness throughout her abdomen and flanks as well.  Current vital signs are notable for hypertension.  Her breathing is unlabored.  Lungs are clear to auscultation.  Patient states on monitor.  Workup was initiated.  EKG shows some ischemic change without evidence of STEMI.  Patient's blood pressure is elevated.  NTG gtt. and ASA were ordered.  Her initial troponin is elevated above baseline concerning for NSTEMI.  I spoke with cardiologist on-call, Dr. Mallipeddi, who did evaluate patient.  Patient is now chest pain-free.  Cardiology recommends initiating heparin  if troponin increases above 500.  She agrees with medical management at [facility] for now.  The MRI brain did not show any acute findings.  There was an age-indeterminate right parietal infarct that is new since prior study.  Her CT scan showed some gas within the right renal collecting system.  This could be reflux from her indwelling suprapubic catheter.  There is also concern of possible early gas forming infection.  Her urinalysis shows bacteriuria and pyuria which may be secondary to colonization.  Out of abundance of caution, cefepime  was ordered for empiric treatment of UTI.  Patient's blood pressure improved on NTG.  She was admitted for further management. I ordered medication including heparin  for seizure  prophylaxis, NTG, ASA, and fentanyl  for chest pain Reevaluation of the patient after these medicines showed that the patient improved I have reviewed the patients home medicines and have made adjustments as needed   Consultations Obtained:  I requested consultation with the cardiologist, Dr. Mallipeddi,  and discussed lab and imaging findings as well as pertinent plan - they recommend: Medical management, initiate heparin  if troponin increases above 500 I requested consultation with the nephrologist, Dr. Norine,  and discussed lab and imaging findings as well as pertinent plan - they recommend: Will arrange inpatient dialysis   Social Determinants of Health:  Bedbound at baseline, frequent hospitalizations     Final diagnoses:  NSTEMI (non-ST elevated myocardial infarction) [facility])    ED Discharge Orders     None          Melvenia Motto, MD 04/19/24 1754    Melvenia Motto, MD 04/19/24 [phone]

## 2024-04-20 ENCOUNTER — Inpatient Hospital Stay (HOSPITAL_COMMUNITY)

## 2024-04-20 DIAGNOSIS — N186 End stage renal disease: Secondary | ICD-10-CM | POA: Diagnosis not present

## 2024-04-20 DIAGNOSIS — M00052 Staphylococcal arthritis, left hip: Secondary | ICD-10-CM

## 2024-04-20 DIAGNOSIS — I2489 Other forms of acute ischemic heart disease: Secondary | ICD-10-CM | POA: Diagnosis not present

## 2024-04-20 DIAGNOSIS — I10 Essential (primary) hypertension: Secondary | ICD-10-CM

## 2024-04-20 DIAGNOSIS — R7989 Other specified abnormal findings of blood chemistry: Secondary | ICD-10-CM

## 2024-04-20 DIAGNOSIS — L89324 Pressure ulcer of left buttock, stage 4: Secondary | ICD-10-CM

## 2024-04-20 DIAGNOSIS — E46 Unspecified protein-calorie malnutrition: Secondary | ICD-10-CM

## 2024-04-20 DIAGNOSIS — I5032 Chronic diastolic (congestive) heart failure: Secondary | ICD-10-CM

## 2024-04-20 DIAGNOSIS — G9341 Metabolic encephalopathy: Secondary | ICD-10-CM | POA: Diagnosis not present

## 2024-04-20 DIAGNOSIS — E440 Moderate protein-calorie malnutrition: Secondary | ICD-10-CM | POA: Insufficient documentation

## 2024-04-20 DIAGNOSIS — Z992 Dependence on renal dialysis: Secondary | ICD-10-CM

## 2024-04-20 DIAGNOSIS — I160 Hypertensive urgency: Secondary | ICD-10-CM

## 2024-04-20 DIAGNOSIS — Z87898 Personal history of other specified conditions: Secondary | ICD-10-CM

## 2024-04-20 DIAGNOSIS — G8220 Paraplegia, unspecified: Secondary | ICD-10-CM

## 2024-04-20 LAB — RENAL FUNCTION PANEL
Albumin: 3.7 g/dL (ref 3.5–5.0)
Anion gap: 12 (ref 5–15)
BUN: 17 mg/dL (ref 6–20)
CO2: 26 mmol/L (ref 22–32)
Calcium: 9.4 mg/dL (ref 8.9–10.3)
Chloride: 98 mmol/L (ref 98–111)
Creatinine, Ser: 3.38 mg/dL — ABNORMAL HIGH (ref 0.44–1.00)
GFR, Estimated: 15 mL/min — ABNORMAL LOW (ref >60)
Glucose, Bld: 80 mg/dL (ref 70–99)
Phosphorus: 4.8 mg/dL — ABNORMAL HIGH (ref 2.5–4.6)
Potassium: 4.2 mmol/L (ref 3.5–5.1)
Sodium: 136 mmol/L (ref 135–145)

## 2024-04-20 LAB — CBC
HCT: 33.2 % — ABNORMAL LOW (ref 36.0–46.0)
Hemoglobin: 9.9 g/dL — ABNORMAL LOW (ref 12.0–15.0)
MCH: 27.7 pg (ref 26.0–34.0)
MCHC: 29.8 g/dL — ABNORMAL LOW (ref 30.0–36.0)
MCV: 93 fL (ref 80.0–100.0)
Platelets: 238 K/uL (ref 150–400)
RBC: 3.57 MIL/uL — ABNORMAL LOW (ref 3.87–5.11)
RDW: 18.1 % — ABNORMAL HIGH (ref 11.5–15.5)
WBC: 4.7 K/uL (ref 4.0–10.5)
nRBC: 0 % (ref 0.0–0.2)

## 2024-04-20 LAB — ECHOCARDIOGRAM COMPLETE
Area-P 1/2: 3.6 cm2
Height: 61 in
MV VTI: 2.9 cm2
S' Lateral: 3.8 cm
Weight: 1995.2 [oz_av]

## 2024-04-20 LAB — SEDIMENTATION RATE: Sed Rate: 49 mm/h — ABNORMAL HIGH (ref 0–30)

## 2024-04-20 LAB — C-REACTIVE PROTEIN: CRP: 2.8 mg/dL — ABNORMAL HIGH (ref <1.0)

## 2024-04-20 LAB — MRSA NEXT GEN BY PCR, NASAL: MRSA by PCR Next Gen: NOT DETECTED

## 2024-04-20 MED ORDER — ENSURE PLUS HIGH PROTEIN PO LIQD: 237.0000 mL | Freq: Two times a day (BID) | ORAL | Status: DC

## 2024-04-20 MED ORDER — SODIUM CHLORIDE 0.9 % IV SOLN
1.0000 g | INTRAVENOUS | Status: DC
  Administered 2024-04-21: 1 g via INTRAVENOUS
  Filled 2024-04-20: qty 1

## 2024-04-20 MED ORDER — VANCOMYCIN VARIABLE DOSE PER UNSTABLE RENAL FUNCTION (PHARMACIST DOSING): Status: DC

## 2024-04-20 MED ORDER — LORAZEPAM 2 MG/ML IJ SOLN: 2.0000 mg | Freq: Four times a day (QID) | INTRAMUSCULAR | Status: DC | PRN

## 2024-04-20 MED ORDER — LEVETIRACETAM 500 MG PO TABS
500.0000 mg | ORAL_TABLET | Freq: Two times a day (BID) | ORAL | Status: DC
  Administered 2024-04-20 – 2024-04-21 (×3): 500 mg via ORAL
  Filled 2024-04-20 (×3): qty 1

## 2024-04-20 MED ORDER — RENA-VITE PO TABS
1.0000 | ORAL_TABLET | Freq: Every day | ORAL | Status: DC
  Administered 2024-04-20: 1 via ORAL
  Filled 2024-04-20: qty 1

## 2024-04-20 MED ORDER — SODIUM CHLORIDE 0.9 % IV SOLN: 2.0000 g | INTRAVENOUS | Status: DC

## 2024-04-20 MED ORDER — VANCOMYCIN HCL 500 MG/100ML IV SOLN
500.0000 mg | INTRAVENOUS | Status: DC
  Administered 2024-04-21: 500 mg via INTRAVENOUS
  Filled 2024-04-20: qty 100

## 2024-04-20 MED ORDER — AMLODIPINE BESYLATE 5 MG PO TABS
10.0000 mg | ORAL_TABLET | Freq: Every evening | ORAL | Status: DC
  Administered 2024-04-20: 10 mg via ORAL
  Filled 2024-04-20: qty 2

## 2024-04-20 NOTE — Assessment & Plan Note (Signed)
 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 Patient uses a wound vac at home  Will consult wound care team during this hospitalization for follow up.

## 2024-04-20 NOTE — Assessment & Plan Note (Addendum)
 Troponin elevation likely due to active infection, no clinical signs of acute coronary syndrome.  2024 echocardiogram with mild reduction in systolic function, with global hypokinesis, LV with grade II diastolic dysfunction with pseudo normalization EA, mild reduced RV systolic function, RVSP 28.2 mmHg, mild dilatation RA and LA, mild TR and mild MR.   Follow up with new echocardiogram Continue blood pressure control with carvedilol  Fluid management with ultrafiltration on HD.

## 2024-04-20 NOTE — Assessment & Plan Note (Addendum)
 Clinically improving, she has brain atrophy and has risk of delirium.  Plan to continue supportive medical therapy.   She has history of seizures, but no signs of active seizure activity.  Plan to continue with keppra . Possible change antibiotic regimen to avoid potential neurologic side effects from cefepime .
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
## 2024-04-20 NOTE — Assessment & Plan Note (Signed)
 Continue wound care. She has been on wheelchair for about 20 years.

## 2024-04-20 NOTE — Assessment & Plan Note (Signed)
Continue blood pressure control with amlodipine and carvedilol.

## 2024-04-20 NOTE — Progress Notes (Addendum)
 Pharmacy Antibiotic Note  [name] is a 58 y.o. female admitted on 04/19/2024 with altered mental status/chest pain at HD on 11/24 unsure if received antibiotics.  Pharmacy has been consulted for cefepime / dosing. 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 with EOT February, 2026   Plan: -Cefepime  1g IV every 24 hours after HD on dialysis days -Vancomycin  500mg  IV after every HD session  -Follow up adding fluconazole  200mg  every day  -Follow up HD schedule while inpatient -Follow up signs of clinical improvement, LOT, de-escalation of antibiotics   Height: 5' 1 (154.9 cm) Weight: 56.6 kg (124 lb 11.2 oz) IBW/kg (Calculated) : 47.8  Temp (24hrs), Avg:98.2 F (36.8 C), Min:97.9 F (36.6 C), Max:98.6 F (37 C)  Recent Labs  Lab 04/19/24 1518 04/19/24 1654 04/20/24 0513  WBC 6.1  --  4.7  LATICACIDVEN 0.6 0.6  --     Estimated Creatinine Clearance: 6.4 mL/min (A) (by C-G formula based on SCr of 7.38 mg/dL (H)).    Antimicrobials this admission: Cefepime  11/19 >>  Vancomycin  11/19 >>   Microbiology results: 11/24 BCx:  11/24 MRSA PCR:   Thank you for allowing pharmacy to be a part of this patient's care.  Lynwood Poplar, PharmD, BCPS Clinical Pharmacist 04/20/2024 6:56 AM

## 2024-04-20 NOTE — Assessment & Plan Note (Signed)
 Patient continue to make urine, she has a suprapubic catheter.   BUN today is 17 and she has no uremic symptoms.  K is 4,2 and serum bicarbonate at 26   Plan to follow up with nephrology for further inpatient renal replacement therapy.

## 2024-04-20 NOTE — Assessment & Plan Note (Signed)
 Consult nutrition, I suspect she has moderate to severe malnutrition.

## 2024-04-20 NOTE — Plan of Care (Signed)

## 2024-04-20 NOTE — Consult Note (Addendum)
 WOC Nurse Consult Note: Reason for Consult: Consult requested for bilat buttock wounds.  Performed remotely after review of progress notes and photos in the EMR.  Pt is critically ill with multiple systemic factors which can impair with healing.  She is on a low airloss mattress to reduce pressure.  Pt has chronic pressure injuries to left and right buttocks which are red and moist and surrounded by pink dry scar tissue.  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    Right buttock Stage 4; 4X5X2.5cm, according to bedside nurses' wound care flow sheet Right ischium with healing Stage 3 , yellow-pink and dry.   Pressure Injury POA: Yes, according to the bedside nurses' wound care flow sheet Dressing procedure/placement/frequency:  Topical treatment orders provided for bedside nurses to perform as follows to provide antimicrobial benefits and absorb drainage: Apply Aquacel Soila # [phone]) packing, using a swab to fill, to left and right buttocks wounds Q day, then cover with foam dressing.  Moisten previous dressings with NS each time to assist with removal.  2. Foam dressing to right ischium, change Q 3 days or PRN soiling  Please re-consult if further assistance is needed.  Thank-you,  Stephane Fought MSN, RN, CWOCN, CWCN-AP, CNS Contact Mon-Fri 0700-1500: [phone]

## 2024-04-20 NOTE — Assessment & Plan Note (Signed)
 Suprapubic catheter in place

## 2024-04-20 NOTE — Progress Notes (Signed)
 Initial Nutrition Assessment  DOCUMENTATION CODES:   Non-severe (moderate) malnutrition in context of chronic illness  INTERVENTION:   Ensure Plus High Protein po BID, each supplement provides 350 kcal and 20 grams of protein. Renal MVI daily.  NUTRITION DIAGNOSIS:   Moderate Malnutrition related to chronic illness (ESRD on HD) as evidenced by mild muscle depletion, mild fat depletion.  GOAL:   Patient will meet greater than or equal to 90% of their needs  MONITOR:   PO intake, Supplement acceptance, Skin  REASON FOR ASSESSMENT:   Consult Assessment of nutrition requirement/status  ASSESSMENT:   58 yo female admitted with AMS, chest pain. PMH includes ESRD on HD, CHF, HTN, paraplegia, decubitus ulcers, pyogenic arthritis of L hip, septic arthritis of R hip, seizure disorder, uterine cancer.  RD visited patient earlier today. She reports good appetite and intake. She drinks Ensure supplements, likes strawberry flavor. She does not walk, she gets around with a wheel chair. She quickly fell back asleep after brief conversation with RD.   Patient was recently hospitalized 11/18-11/22 ([facility]) for septic arthritis with plans for 6 weeks antibiotics to be received at outpatient HD clinic.   Weight history reviewed.  Usual weight 43-45 kg since June 2025 Weight at [facility] 11/20: 47.5 kg EDW: 45 kg per Nephrology note Current weight 56.6 kg (04/19/24) Weight fluctuations are likely related to fluids with hx of ESRD on HD.   Currently on a heart healthy diet. Meal intakes not recorded.  Labs reviewed.  Phos 4.8 CBG: 102  Medications reviewed and include heparin , keppra .  Patient meets criteria for moderate malnutrition, given mild depletion of muscle and subcutaneous fat mass.  NUTRITION - FOCUSED PHYSICAL EXAM:  Flowsheet Row Most Recent Value  Upper Arm Region Mild depletion  Thoracic and Lumbar Region No depletion  Buccal Region No depletion  Temple Region  Mild depletion  Clavicle Bone Region Mild depletion  Clavicle and Acromion Bone Region No depletion  Scapular Bone Region No depletion  Dorsal Hand Moderate depletion  Patellar Region Severe depletion  Anterior Thigh Region Severe depletion  Posterior Calf Region Severe depletion  Edema (RD Assessment) None  Hair Reviewed  Eyes Reviewed  Mouth Reviewed  Skin Reviewed  Nails Reviewed    Diet Order:   Diet Order             Diet Heart Fluid consistency: Thin  Diet effective now                   EDUCATION NEEDS:   Not appropriate for education at this time  Skin:  Skin Assessment: Skin Integrity Issues: Skin Integrity Issues:: Stage III, Stage IV Stage III: R ischium, healing Stage IV: L & R buttocks  Last BM:  11/24  Height:   Ht Readings from Last 1 Encounters:  04/19/24 5' 1 (1.549 m)    Weight:   Wt Readings from Last 1 Encounters:  04/19/24 56.6 kg    BMI:  Body mass index is 23.56 kg/m.  Estimated Nutritional Needs:   Kcal:  1350-1575  Protein:  60-70 gm  Fluid:  1 L +UOP   Suzen HUNT RD, LDN, CNSC Contact via secure chat. If unavailable, use group chat RD Inpatient.

## 2024-04-20 NOTE — Progress Notes (Addendum)
 Contacted by nephrology regarding Iv abx needs at d/c to out-pt HD clinic.  Contacted [facility], informed that pt will need ceftazidime  1gm IV MWF w/ last dose 12/29. Clinic stated they do have this and it is available to give. Care team including attending and nephrology updated. Will continue to assist as needed.   Bairon Klemann Dialysis navigator [phone]

## 2024-04-20 NOTE — Consult Note (Signed)
 [facility]  INPATIENT CONSULTATION  Reason for Consultation: ESRD Requesting Provider: Dr. Noralee  HPI: [name] is a 58 y.o. 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   Presented to [facility] ED yesterday with CP and AMS x 2days per family.  She was HTN to 190s with normal O2 sats.  Troponin flat and similar to priors.  She was admitted to ICU on NTG gtt which she remains on this AM. Oral meds have been resumed.  Daughter bedside and thinks mom is improving this AM.  Patient c/o R sided rib pain still, pointing to under R breast.  Had full dialysis yesterday.   Recently admitted last week 11/18-22 [facility] for septic arthritis with plans to complete 6wks antibiotic therapy at outpt HD.  She also is on chronic fluconzole for Candida wounds through 06/2024.   PMH: Past Medical History:  Diagnosis Date   Abnormal uterine bleeding (AUB) 06/15/2014   Cancer (HCC)    uterine   ESRD on hemodialysis (HCC)    High blood pressure    Paraplegia (lower)    Seizure disorder (HCC)    Seizures (HCC)    Suprapubic catheter (HCC)    Urinary tract infection    PSH: Past Surgical History:  Procedure Laterality Date   APPLICATION OF WOUND VAC Right 09/13/2021   (approximately 1-40mos ago) pressure sore on right hip   BACK SURGERY     Pt stated before 2000   BIOPSY  12/03/2022   Procedure: BIOPSY;  Surgeon: [name], MD;  Location: [facility];  Service: Gastroenterology;;   ESOPHAGOGASTRODUODENOSCOPY N/A 09/20/2015   Procedure: ESOPHAGOGASTRODUODENOSCOPY (EGD);  Surgeon: [name], MD;  Location:  [facility];  Service: Endoscopy;  Laterality: N/A;  730   ESOPHAGOGASTRODUODENOSCOPY (EGD) WITH PROPOFOL  N/A 12/03/2022   Procedure: ESOPHAGOGASTRODUODENOSCOPY (EGD) WITH PROPOFOL ;  Surgeon: [name], MD;  Location: [facility];  Service: Gastroenterology;  Laterality: N/A;  1:15 pm, asa 3, pt knows to arrive at 10:30  dialysis pt, M,W & F   IR CATHETER TUBE CHANGE  04/02/2018   PERCUTANEOUS ENDOSCOPIC GASTROSTOMY (PEG) REMOVAL N/A 09/20/2015   Procedure: PERCUTANEOUS ENDOSCOPIC GASTROSTOMY (PEG) REMOVAL;  Surgeon: [name], MD;  Location: [facility];  Service: Endoscopy;  Laterality: N/A;   TEE WITHOUT CARDIOVERSION N/A 11/11/2022   Procedure: TRANSESOPHAGEAL ECHOCARDIOGRAM (TEE);  Surgeon: [name], MD;  Location: [facility] ORS;  Service: Cardiovascular;  Laterality: N/A;   Past Medical History:  Diagnosis Date   Abnormal uterine bleeding (AUB) 06/15/2014   Cancer (HCC)    uterine   ESRD on hemodialysis (HCC)    High blood pressure    Paraplegia (lower)    Seizure disorder (HCC)    Seizures (HCC)    Suprapubic catheter (HCC)    Urinary tract infection     Medications:  I have reviewed the patient's current medications.  Medications Prior to Admission  Medication Sig Dispense Refill   acetaminophen  (TYLENOL ) 500 MG tablet Take 1,000 mg by mouth every 6 (six) hours as needed for moderate pain.     albuterol  (PROVENTIL ) (2.5 MG/3ML) 0.083% nebulizer solution Take 3 mLs (2.5 mg total) by nebulization  every 4 (four) hours as needed for wheezing or shortness of breath. 75 mL 2   amLODipine  (NORVASC ) 10 MG tablet Take 10 mg by mouth daily.     B Complex-C-Folic Acid  (DIALYVITE TABLET) TABS Take 1 tablet by mouth daily.     carvedilol  (COREG ) 25 MG tablet Take 25 mg by mouth 2 (two) times daily.     cyclobenzaprine  (FLEXERIL ) 10 MG tablet Take 10 mg by mouth 3 (three) times daily as needed.     Darbepoetin Alfa  (ARANESP ) 200 MCG/0.4ML SOSY injection Inject 0.4  mLs (200 mcg total) into the skin every Monday at 6 PM.     fluconazole  (DIFLUCAN ) 100 MG tablet Take 200 mg by mouth daily.     gabapentin  (NEURONTIN ) 100 MG capsule Take 1 capsule (100 mg total) by mouth 2 (two) times daily. 60 capsule 2   hydrALAZINE  (APRESOLINE ) 50 MG tablet Take 50 mg by mouth 3 (three) times daily.     hydrocortisone  2.5 % cream Apply 1 Application topically 2 (two) times daily.     hydrOXYzine  (ATARAX ) 10 MG tablet Take 10 mg by mouth every 8 (eight) hours as needed for itching.     irbesartan  (AVAPRO ) 150 MG tablet Take 150 mg by mouth every evening.     levETIRAcetam  (KEPPRA ) 1000 MG tablet Take 1,000 mg by mouth at bedtime.     levETIRAcetam  (KEPPRA ) 500 MG tablet TAKE (2) TABLETS BY MOUTH ONCE A DAY.TAKE AN ADDITIONAL TABLET AFTER DIALYSIS ON MONDAY,WEDNESDAY,AND FRIDAY EVENING. 72 tablet 1   lidocaine -prilocaine  (EMLA ) cream Apply 1 Application topically 3 (three) times a week.     ondansetron  (ZOFRAN ) 4 MG tablet Take 1 tablet (4 mg total) by mouth every 6 (six) hours. 12 tablet 0   ondansetron  (ZOFRAN -ODT) 4 MG disintegrating tablet 4mg  ODT q4 hours prn nausea/vomit 10 tablet 0   oxyCODONE  (OXY IR/ROXICODONE ) 5 MG immediate release tablet Take 2.5 mg by mouth every 6 (six) hours as needed for moderate pain (pain score 4-6).     pantoprazole  (PROTONIX ) 40 MG tablet Take 40 mg by mouth daily.     senna-docusate (SENOKOT-S) 8.6-50 MG tablet Take 2 tablets by mouth at bedtime. 60 tablet 3    ALLERGIES:   Allergies  Allergen Reactions   Other Anaphylaxis    Dialyzer   Bactoshield Chg [Chlorhexidine  Gluconate] Hives and Itching    States she is allergic to the CHG wipes    Cefepime  Other (See Comments)    Severe AMS 09/2023 admission   Linezolid Other (See Comments)    Patient self-discontinued treatment due to GI intolerance. Taking it along with moxifloxacin   Moxifloxacin Other (See Comments)    Patient self-discontinued treatment due to GI intolerance.  Taking it along with linezolid   Quinine Derivatives Other (See Comments)    Alters mental status   Vancomycin  Other (See Comments)    Unknown  Pt is tolerating this medication at HD   Azithromycin  Itching and Rash   Benadryl [Diphenhydramine Hcl (Sleep)] Hives   Daptomycin Hives   Piperacillin  Dermatitis   Tetracycline Itching    Able to tolerate Doxycycline .    Zosyn  [Piperacillin  Sod-Tazobactam So] Rash    Tolerated in 12/2023 admission    FAM HX: Family History  Problem Relation Age of Onset   Cancer Mother    Hypertension Mother    Cancer Sister        breast and then spread everywhere.   Diabetes Paternal Grandmother    Hypertension Paternal  Grandmother     Social History:   reports that she has never smoked. She has never used smokeless tobacco. She reports current alcohol use. She reports that she does not use drugs.  ROS: 12 system ROS neg except per HPI above  Blood pressure (!) 166/73, pulse 75, temperature 98 F (36.7 C), temperature source Oral, resp. rate 19, height 5' 1 (1.549 m), weight 56.6 kg, SpO2 95%. PHYSICAL EXAM: Gen: calm in bed  Eyes: EOMI, anicteric ENT: MMM Neck: supple, no JVD CV: RRR Abd: soft Lungs: clear Extr: LUE AVF +t/b Neuro: oriented to self, [facility], not month, not Thanksgiving week    Results for orders placed or performed during the hospital encounter of 04/19/24 (from the past 48 hours)  Lactic acid, plasma     Status: None   Collection Time: 04/19/24  3:18 PM  Result Value Ref Range   Lactic Acid, Venous 0.6 0.5 - 1.9 mmol/L    Comment: Performed at [facility], [address], [place], [place] [address]  CBC with Differential     Status: Abnormal   Collection Time: 04/19/24  3:18 PM  Result Value Ref Range   WBC 6.1 4.0 - 10.5 K/uL   RBC 3.46 (L) 3.87 - 5.11 MIL/uL   Hemoglobin 9.5 (L) 12.0 - 15.0 g/dL   HCT 68.0 (L) 63.9 - 53.9 %   MCV 92.2 80.0 - 100.0 fL   MCH 27.5 26.0 - 34.0 pg   MCHC 29.8 (L) 30.0 - 36.0 g/dL    RDW 81.8 (H) 88.4 - 15.5 %   Platelets 241 150 - 400 K/uL   nRBC 0.0 0.0 - 0.2 %   Neutrophils Relative % 63 %   Neutro Abs 3.9 1.7 - 7.7 K/uL   Lymphocytes Relative 20 %   Lymphs Abs 1.2 0.7 - 4.0 K/uL   Monocytes Relative 9 %   Monocytes Absolute 0.5 0.1 - 1.0 K/uL   Eosinophils Relative 6 %   Eosinophils Absolute 0.4 0.0 - 0.5 K/uL   Basophils Relative 1 %   Basophils Absolute 0.1 0.0 - 0.1 K/uL   Immature Granulocytes 1 %   Abs Immature Granulocytes 0.07 0.00 - 0.07 K/uL    Comment: Performed at [facility], [address] [place], [place] [address]  Protime-INR     Status: None   Collection Time: 04/19/24  3:18 PM  Result Value Ref Range   Prothrombin Time 14.6 11.4 - 15.2 seconds   INR 1.1 0.8 - 1.2    Comment: (NOTE) INR goal varies based on device and disease states. Performed at [facility], [address] [place], [place] [address]   Culture, blood (Routine x 2)     Status: None (Preliminary result)   Collection Time: 04/19/24  3:18 PM   Specimen: BLOOD  Result Value Ref Range   Specimen Description BLOOD BLOOD RIGHT ARM AEROBIC BOTTLE ONLY    Special Requests Blood Culture adequate volume    Culture      NO GROWTH < 24 HOURS Performed at [facility], [address], [place], [place] [address]    Report Status PENDING   Troponin T, High Sensitivity     Status: Abnormal   Collection Time: 04/19/24  3:18 PM  Result Value Ref Range   Troponin T High Sensitivity 160 (HH) 0 - 19 ng/L    Comment: Critical Value, Read Back and verified with BELTON, C ON 04/19/24 AT 1650 BY LOY, C (NOTE) Biotin concentrations > 1000 ng/mL  falsely decrease TnT results.  Serial cardiac troponin measurements are suggested.  Refer to the Links section for chest pain algorithms and additional  guidance. Performed at [facility], [address], [place], [place] [address]   Ammonia     Status: None   Collection Time: 04/19/24  3:18 PM  Result Value Ref Range   Ammonia <13 9 - 35  umol/L    Comment: Performed at [facility], [address], [place], [place] [address]  Hepatic function panel     Status: None   Collection Time: 04/19/24  3:18 PM  Result Value Ref Range   Total Protein 7.3 6.5 - 8.1 g/dL   Albumin  3.6 3.5 - 5.0 g/dL   AST 15 15 - 41 U/L   ALT 7 0 - 44 U/L   Alkaline Phosphatase 75 38 - 126 U/L   Total Bilirubin 0.5 0.0 - 1.2 mg/dL   Bilirubin, Direct 0.2 0.0 - 0.2 mg/dL   Indirect Bilirubin 0.3 0.3 - 0.9 mg/dL    Comment: Performed at [facility], [address] [place], [place] [address]  Blood gas, venous     Status: Abnormal   Collection Time: 04/19/24  3:18 PM  Result Value Ref Range   pH, Ven 7.49 (H) 7.25 - 7.43   pCO2, Ven 39 (L) 44 - 60 mmHg   pO2, Ven 49 (H) 32 - 45 mmHg   Bicarbonate 29.8 (H) 20.0 - 28.0 mmol/L   Acid-Base Excess 5.8 (H) 0.0 - 2.0 mmol/L   O2 Saturation 80.8 %   Patient temperature 36.7    Collection site RIGHT ANTECUBITAL    Drawn by [phone]     Comment: Performed at [facility], [address] [place], [place] [address]  Magnesium      Status: None   Collection Time: 04/19/24  3:18 PM  Result Value Ref Range   Magnesium  1.9 1.7 - 2.4 mg/dL    Comment: Performed at [facility], [address], [place], [place] [address]  Culture, blood (Routine x 2)     Status: None (Preliminary result)   Collection Time: 04/19/24  3:19 PM   Specimen: BLOOD  Result Value Ref Range   Specimen Description BLOOD BLOOD RIGHT ARM    Special Requests      Blood Culture adequate volume BOTTLES DRAWN AEROBIC AND ANAEROBIC   Culture      NO GROWTH < 24 HOURS Performed at [facility], [address] [place] [address] [place], [place] [address]    Report Status PENDING   CBG monitoring, ED     Status: Abnormal   Collection Time: 04/19/24  3:28 PM  Result Value Ref Range   Glucose-Capillary 102 (H) 70 - 99 mg/dL    Comment: Glucose reference range applies only to samples taken after fasting for at least 8 hours.  Urinalysis, w/ Reflex to Culture  (Infection Suspected) -Urine, Catheterized     Status: Abnormal   Collection Time: 04/19/24  3:42 PM  Result Value Ref Range   Specimen Source URINE, CATHETERIZED    Color, Urine YELLOW YELLOW   APPearance CLOUDY (A) CLEAR   Specific Gravity, Urine 1.008 1.005 - 1.030   pH 8.0 5.0 - 8.0   Glucose, UA >=500 (A) NEGATIVE mg/dL   Hgb urine dipstick SMALL (A) NEGATIVE   Bilirubin Urine NEGATIVE NEGATIVE   Ketones, ur 5 (A) NEGATIVE mg/dL   Protein, ur >=699 (A) NEGATIVE mg/dL   Nitrite NEGATIVE NEGATIVE   Leukocytes,Ua LARGE (A) NEGATIVE  RBC / HPF 21-50 0 - 5 RBC/hpf   WBC, UA >50 0 - 5 WBC/hpf    Comment:        Reflex urine culture not performed if WBC <=10, OR if Squamous epithelial cells >5. If Squamous epithelial cells >5 suggest recollection.    Bacteria, UA RARE (A) NONE SEEN   Squamous Epithelial / HPF 21-50 0 - 5 /HPF   WBC Clumps PRESENT    Mucus PRESENT    Non Squamous Epithelial 6-10 (A) NONE SEEN    Comment: Performed at [facility], [address] [place], [place] [address]  Lactic acid, plasma     Status: None   Collection Time: 04/19/24  4:54 PM  Result Value Ref Range   Lactic Acid, Venous 0.6 0.5 - 1.9 mmol/L    Comment: Performed at [facility], [address] [place], [place] [address]  Troponin T, High Sensitivity     Status: Abnormal   Collection Time: 04/19/24  4:54 PM  Result Value Ref Range   Troponin T High Sensitivity 164 (HH) 0 - 19 ng/L    Comment: Critical value noted. Value is consistent with previously reported and called value  (NOTE) Biotin concentrations > 1000 ng/mL falsely decrease TnT results.  Serial cardiac troponin measurements are suggested.  Refer to the Links section for chest pain algorithms and additional  guidance. Performed at [facility], [address], [place], [place] [address]   MRSA Next Gen by PCR, Nasal     Status: None   Collection Time: 04/19/24  9:41 PM   Specimen: Nasal Mucosa; Nasal Swab  Result Value Ref  Range   MRSA by PCR Next Gen NOT DETECTED NOT DETECTED    Comment: (NOTE) The GeneXpert MRSA Assay (FDA approved for NASAL specimens only), is one component of a comprehensive MRSA colonization surveillance program. It is not intended to diagnose MRSA infection nor to guide or monitor treatment for MRSA infections. Test performance is not FDA approved in patients less than 70 years old. Performed at [facility], [address], [place], [place] [address]   CBC     Status: Abnormal   Collection Time: 04/20/24  5:13 AM  Result Value Ref Range   WBC 4.7 4.0 - 10.5 K/uL   RBC 3.57 (L) 3.87 - 5.11 MIL/uL   Hemoglobin 9.9 (L) 12.0 - 15.0 g/dL   HCT 66.7 (L) 63.9 - 53.9 %   MCV 93.0 80.0 - 100.0 fL   MCH 27.7 26.0 - 34.0 pg   MCHC 29.8 (L) 30.0 - 36.0 g/dL   RDW 81.8 (H) 88.4 - 84.4 %   Platelets 238 150 - 400 K/uL   nRBC 0.0 0.0 - 0.2 %    Comment: Performed at [facility], [address] [place], [place] [address]  Renal function panel     Status: Abnormal   Collection Time: 04/20/24  5:13 AM  Result Value Ref Range   Sodium 136 135 - 145 mmol/L   Potassium 4.2 3.5 - 5.1 mmol/L   Chloride 98 98 - 111 mmol/L   CO2 26 22 - 32 mmol/L   Glucose, Bld 80 70 - 99 mg/dL    Comment: Glucose reference range applies only to samples taken after fasting for at least 8 hours.   BUN 17 6 - 20 mg/dL   Creatinine, Ser 6.61 (H) 0.44 - 1.00 mg/dL   Calcium  9.4 8.9 - 10.3 mg/dL   Phosphorus 4.8 (H) 2.5 - 4.6 mg/dL  Albumin  3.7 3.5 - 5.0 g/dL   GFR, Estimated 15 (L) >60 mL/min    Comment: (NOTE) Calculated using the CKD-EPI Creatinine Equation (2021)    Anion gap 12 5 - 15    Comment: Performed at [facility], [address], [place], [place] [address]  Sedimentation rate     Status: Abnormal   Collection Time: 04/20/24  5:13 AM  Result Value Ref Range   Sed Rate 49 (H) 0 - 30 mm/hr    Comment: Performed at [facility], [address], [place], [place] [address]    CT CHEST ABDOMEN PELVIS  WO CONTRAST Result Date: 04/19/2024 CLINICAL DATA:  Sepsis EXAM: CT CHEST, ABDOMEN AND PELVIS WITHOUT CONTRAST TECHNIQUE: Multidetector CT imaging of the chest, abdomen and pelvis was performed following the standard protocol without IV contrast. RADIATION DOSE REDUCTION: This exam was performed according to the departmental dose-optimization program which includes automated exposure control, adjustment of the mA and/or kV according to patient size and/or use of iterative reconstruction technique. COMPARISON:  04/12/2024 FINDINGS: Of note, the lack of intravenous contrast limits evaluation of the solid organ parenchyma and vascularity. CT CHEST FINDINGS Cardiovascular: Unchanged moderate cardiomegaly with a small pericardial effusion. Scattered multi-vessel coronary atherosclerosis.No aortic aneurysm. Calcified aortic atherosclerosis throughout. Mediastinum/Nodes: No mediastinal mass.No mediastinal, hilar, or axillary lymphadenopathy. Lungs/Pleura: The midline trachea and bronchi are patent. No focal airspace consolidation, pleural effusion, or pneumothorax. Posterior bibasilar dependent atelectasis. CT ABDOMEN PELVIS FINDINGS Hepatobiliary: No mass.Small volume layering biliary sludge.No radiopaque stones or wall thickening of the gallbladder. No intrahepatic or extrahepatic biliary ductal dilation. Pancreas: No mass or main ductal dilation. No peripancreatic inflammation or fluid collection. Spleen: Normal size. No mass. Adrenals/Urinary Tract: No adrenal masses. Severe bilateral renal cortical atrophy, unchanged. A couple of small hypodensities are noted in both kidneys, likely small cyst. Moderate amount of gas within the right renal collecting system. No hydronephrosis or nephrolithiasis. Well-positioned suprapubic catheter terminating in the urinary bladder with multiple locules of gas noted. The urinary bladder is otherwise completely decompressed. Stomach/Bowel: The stomach is decompressed without focal  abnormality. No small bowel wall thickening or inflammation. No small bowel obstruction.Normal appendix. Scattered colonic diverticulosis without changes of acute diverticulitis. Vascular/Lymphatic: No aortic aneurysm. Diffuse aortoiliac atherosclerosis. Unchanged 1.2 cm left external iliac chain lymph node, likely reactive. Reproductive: Age-related atrophy of the uterus and ovaries. No concerning adnexal mass. No free pelvic fluid. Other: No pneumoperitoneum, ascites, or mesenteric inflammation. Musculoskeletal: Diffuse osteopenia. Chronic grade 2 anterolisthesis in the midthoracic spine with postsurgical fusion rod hardware noted spanning the thoracic spine. Multilevel degenerative disc disease noted. 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 The bony changes of the right femoral head are also unchanged. Similar appearance of a large decubitus ulcer overlying the proximal left femur and left ischium with underlying bony changes of chronic osteomyelitis. Ovoid, peripherally calcified collection overlying the sacrococcygeal spinal soft tissues is also unchanged. Decreased size of the right hip effusion. Right ischial decubitus ulcer is also unchanged. IMPRESSION: 1. Moderate amount of gas within the right renal collecting system, which may be due to reflux from the indwelling suprapubic catheter. Correlation with urinalysis recommended to exclude early gas producing infection. 2. Scattered colonic diverticulosis without changes of acute diverticulitis. 3. No pneumonia, pulmonary edema, or pleural effusion. 4. Similar appearance of large decubitus ulcers, the largest overlies the left ischium and proximal left femur with underlying bony changes of chronic osteomyelitis. Electronically Signed   By: Rogelia Carlean HERO.D.  On: 04/19/2024 17:39   CT HEAD WO CONTRAST Result Date: 04/19/2024 CLINICAL DATA:  Mental status change, unknown cause. EXAM: CT  HEAD WITHOUT CONTRAST TECHNIQUE: Contiguous axial images were obtained from the base of the skull through the vertex without intravenous contrast. RADIATION DOSE REDUCTION: This exam was performed according to the departmental dose-optimization program which includes automated exposure control, adjustment of the mA and/or kV according to patient size and/or use of iterative reconstruction technique. COMPARISON:  Head MRI 04/19/2024 FINDINGS: Brain: There is no evidence of an acute infarct, intracranial hemorrhage, mass, midline shift, or extra-axial fluid collection. Encephalomalacia is again noted in the posterior left frontal lobe. A small region of cortical hypodensity in the right parietal lobe corresponds to a suspected late subacute to chronic infarct on MRI. Hypodensities elsewhere in the cerebral white matter bilaterally are nonspecific but compatible with moderately age advanced chronic small vessel ischemic disease. Cerebral atrophy is moderately advanced for age. Vascular: Calcified atherosclerosis at the skull base. Skull: No acute fracture or suspicious lesion. Sinuses/Orbits: Trace bilateral mastoid fluid. Clear paranasal sinuses. Unremarkable orbits. Other: None. IMPRESSION: 1. No evidence of acute intracranial abnormality. 2. Moderately age advanced chronic small vessel ischemic disease and cerebral atrophy. 3. Chronic posterior left frontal infarct. Small late subacute to chronic right parietal infarct. Electronically Signed   By: Dasie Hamburg M.D.   On: 04/19/2024 17:12   MR BRAIN WO CONTRAST Result Date: 04/19/2024 CLINICAL DATA:  Mental status change, unknown cause. EXAM: MRI HEAD WITHOUT CONTRAST TECHNIQUE: Multiplanar, multiecho pulse sequences of the brain and surrounding structures were obtained without intravenous contrast. COMPARISON:  Head MRI 09/26/2023 FINDINGS: The examination is motion degraded, including severe motion on the axial FLAIR sequence and moderate motion on other  sequences. Brain: No acute infarct, mass, midline shift, or extra-axial fluid collection is identified. Cerebral atrophy is moderately advanced for age. Encephalomalacia is again noted involving the posterior left frontal lobe and cingulate gyrus with associated chronic blood products and ex vacuo dilatation of the body of the left lateral ventricle. T2 hyperintensities elsewhere in the cerebral white matter bilaterally are similar to the prior MRI and nonspecific but compatible with moderate chronic small vessel ischemic disease. A small region of cortical intrinsic T1 hyperintensity and susceptibility in the right parietal lobe is new from the prior MRI and suggestive of an interval late subacute to chronic infarct. Vascular: Major intracranial vascular flow voids are preserved. Skull and upper cervical spine: No suspicious marrow lesion. Sinuses/Orbits: Trace bilateral mastoid fluid. Clear paranasal sinuses. Unremarkable orbits. Other: None. IMPRESSION: 1. No acute intracranial abnormality identified on this motion degraded examination. 2. Moderate chronic small vessel ischemic disease and cerebral atrophy. 3. Chronic left frontal infarct. 4. Small late subacute to chronic right parietal infarct. Electronically Signed   By: Dasie Hamburg M.D.   On: 04/19/2024 17:10   DG Chest Port 1 View if patient is in a treatment room. Result Date: 04/19/2024 CLINICAL DATA:  Suspected sepsis.  Chest pain. EXAM: PORTABLE CHEST 1 VIEW COMPARISON:  04/12/2024 and CT chest 04/12/2024. FINDINGS: Patient is rotated. Trachea is midline. Heart is enlarged, stable. Thoracic aorta is calcified. Vague opacification in the left lower lobe. Trace right pleural effusion. Minimal right base atelectasis. IMPRESSION: 1. Vague left lower lobe opacification may be due to atelectasis and/or pleural fluid. Difficult to exclude pneumonia. 2. Tiny right pleural effusion. Electronically Signed   By: Newell Eke M.D.   On: 04/19/2024 15:36    Outpatient dialysis unit: [facility]  dialysis prescription: MWF, 3hr , EDW 45kg, LUE AVF, Elisio 17h, BFR 400, DFR 500, 2/2.5 bath, UF profile 2  Assessment/PlanNellie Charnita Trudel is a/an 58 y.o. female with a past medical history notable for ESRD on HD admitted with anemia.   #CP: thought to be demand ischemia in setting of HTN urgency, cardiology was consulted in the ED.    # AMS:  per primary - daughter reports improving but if not reliably improving consider cefepime  side effect.    # ESRD: Had full HD Mon, labs and volume acceptable today.  Plan next HD tomorrow.     # Volume/ hypertension: does not appear grossly volume overloaded.  Blood pressure elevated and currently on NTG gtt in ICU; home meds have been resumed.  UF with HD tomorrow.    # Anemia of Chronic Kidney Disease: Hb 9s, ESA here if remains admitted. No iron  given infections.   # Secondary Hyperparathyroidism/Hyperphosphatemia: Cont home calcitriol  and sensipar . Obtain phos level and cont home binders   # Vascular access: No issues with AVG   # Additional recommendations: - Dose all meds for creatinine clearance < 10 ml/min  - Unless absolutely necessary, no MRIs with gadolinium.  - Implement save arm precautions.  Prefer needle sticks in the dorsum of the hands or wrists.  No blood pressure measurements in arm. - If blood transfusion is requested during hemodialysis sessions, please alert us  prior to the session.  - Use synthetic opioids (Fentanyl /Dilaudid ) if needed   Recommendations were discussed with the primary team.  Manuelita DELENA Barters 04/20/2024, 8:08 AM

## 2024-04-20 NOTE — TOC Initial Note (Signed)
 Transition of Care [facility]) - Initial/Assessment Note    Patient Details  Name: [name] MRN: [id number] Date of Birth: 07-17-1965  Transition of Care [facility]) CM/SW Contact:    Sharlyne Stabs, RN Phone Number: 04/20/2024, 11:39 AM  Clinical Narrative:       Patient admitted with Chest Pain. Considered to be high risk for readmission. HD navigator following.  Paraplegia since 2000 with multiple comorbidities. She lives with her daughter who is her primary caretaker. Pt has a hospital bed, hoyer lift, wheelchair at home managed by [employer]. Pt is on 2L of O2 at home provided by [employer]. Pt receives [facility] for wound care 3x week with [facility]. IPCM following for resumption orders.              Expected Discharge Plan: Home w Home Health Services Barriers to Discharge: Continued Medical Work up   Patient Goals and CMS Choice Patient states their goals for this hospitalization and ongoing recovery are:: Return home CMS Medicare.gov Compare Post Acute Care list provided to:: Patient Choice offered to / list presented to : Patient [place] ownership interest in [facility].provided to:: Patient    Expected Discharge Plan and Services       Living arrangements for the past 2 months: Single Family Home                      HH Agency: [facility]     Prior Living Arrangements/Services Living arrangements for the past 2 months: Single Family Home Lives with:: Adult Children Patient language and need for interpreter reviewed:: Yes Do you feel safe going back to the place where you live?: Yes      Need for Family Participation in Patient Care: Yes (Comment) Care giver support system in place?: Yes (comment) Current home services: DME, Home RN Criminal Activity/Legal Involvement Pertinent to Current Situation/Hospitalization: No - Comment as needed  Activities of Daily Living   ADL Screening (condition at time of  admission) Independently performs ADLs?: No Does the patient have a NEW difficulty with bathing/dressing/toileting/self-feeding that is expected to last >3 days?: No Does the patient have a NEW difficulty with getting in/out of bed, walking, or climbing stairs that is expected to last >3 days?: No Does the patient have a NEW difficulty with communication that is expected to last >3 days?: No Is the patient deaf or have difficulty hearing?: No Does the patient have difficulty seeing, even when wearing glasses/contacts?: No Does the patient have difficulty concentrating, remembering, or making decisions?: Yes  Permission Sought/Granted            Permission granted to share info w Relationship: Daughter     Emotional Assessment       Orientation: : Oriented to Self, Oriented to Place Alcohol / Substance Use: Not Applicable Psych Involvement: No (comment)  Admission diagnosis:  NSTEMI (non-ST elevated myocardial infarction) (HCC) [I21.4] Chest pain [R07.9] Patient Active Problem List   Diagnosis Date Noted   Chest pain 04/19/2024   Complicated open wound of left hip 03/17/2024   SIRS (systemic inflammatory response syndrome) (HCC) 12/31/2023   Pyogenic arthritis of left hip (HCC) 12/31/2023   Hemorrhagic shock (HCC) 12/30/2023   Symptomatic anemia 12/29/2023   Hypotension 12/29/2023   Chronic diastolic CHF (congestive heart failure) (HCC) 11/24/2023   Thigh abscess 11/21/2023   Community acquired pneumonia 11/20/2023   H/O urinary retention 11/20/2023   COVID-19 virus infection 11/20/2023   Right hip  joint effusion 11/20/2023   Hospital discharge follow-up 10/07/2023   Pressure injury of skin 07/12/2023   RSV (respiratory syncytial virus infection) 06/28/2023   Volume overload 05/20/2023   Legionella pneumonia (HCC) 05/05/2023   Acute respiratory failure with hypoxia (HCC) 05/02/2023   Pulmonary edema 04/13/2023   Diarrhea 04/13/2023   Dyspnea and respiratory  abnormalities 03/17/2023   Dyspnea 03/05/2023   Retroperitoneal hematoma 12/31/2022   Subcutaneous hematoma 12/31/2022   Sepsis due to undetermined organism (HCC) 12/18/2022   Chronic pain 12/14/2022   Bacteremia 11/08/2022   Loss of weight 10/26/2022   N&V (nausea and vomiting) 10/26/2022   Cellulitis of knee, left 07/24/2022   Infected decubitus ulcer 07/23/2022   Chronic osteomyelitis of left femur    Chronic anemia 02/25/2022   ESRD on hemodialysis (HCC) 02/25/2022   Abscess of buttock, right 08/25/2021   GERD (gastroesophageal reflux disease) 08/25/2021   Anxiety 08/25/2021   Decubitus ulcers 08/25/2021   Anemia 08/24/2021   AKI (acute kidney injury) 04/25/2017   Dehydration 04/25/2017   Metabolic acidosis 04/25/2017   Altered mental status 06/22/2016   Altered mental state 08/16/2015   Pressure ulcer 05/16/2015   Acute metabolic encephalopathy 05/15/2015   Malnutrition    Sepsis (HCC) 10/10/2014   HCAP (healthcare-associated pneumonia) 10/10/2014   Encephalomalacia 10/10/2014   TBI (traumatic brain injury) (HCC) 10/10/2014   Severe protein-calorie malnutrition (HCC) 10/10/2014   UTI (urinary tract infection) 08/26/2014   Hypokalemia 08/26/2014   Abnormal uterine bleeding (AUB) 06/15/2014   Essential hypertension    Seizure disorder (HCC)    Status epilepticus (HCC) 05/02/2014   Hypertensive emergency 05/02/2014   Acute renal failure (HCC) 05/21/2013   Hyperkalemia 05/21/2013   Hypothermia 05/21/2013   Paraplegia (HCC) 06/25/2012   Suprapubic catheter (HCC) 06/25/2012   Decubitus ulcer of buttock 06/25/2012   PCP:  Edman Meade PEDLAR, FNP Pharmacy:   [facility] [place], [place] - [address] [place] [address] Phone: [phone] Fax: [phone]   Social Drivers of Health (SDOH) Social History: SDOH Screenings   Food Insecurity: No Food Insecurity (04/19/2024)  Housing: Low Risk  (04/19/2024)  Transportation  Needs: No Transportation Needs (04/19/2024)  Utilities: Not At Risk (04/19/2024)  Depression (PHQ2-9): Low Risk  (11/20/2023)  Recent Concern: Depression (PHQ2-9) - High Risk (10/07/2023)  Tobacco Use: Low Risk  (04/19/2024)   SDOH Interventions:     Readmission Risk Interventions    04/20/2024   11:37 AM 03/11/2024   10:32 AM 09/22/2023    8:17 PM  Readmission Risk Prevention Plan  Transportation Screening Complete Complete Complete  Medication Review (RN Care Manager) Complete Complete Complete  PCP or Specialist appointment within 3-5 days of discharge Complete Complete Complete  HRI or Home Care Consult  Complete Complete  SW Recovery Care/Counseling Consult Complete Complete Complete  Palliative Care Screening Not Applicable Not Applicable Not Applicable  Skilled Nursing Facility Not Applicable Not Applicable Not Applicable

## 2024-04-20 NOTE — Progress Notes (Signed)
 Echocardiogram Attempted exam at 1pm. Patient being moved to a different room; will attempt again later.  [name] 04/20/2024, 1:01 PM
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
## 2024-04-20 NOTE — Progress Notes (Signed)
 Echocardiogram 2D Echocardiogram has been performed.  Juliene JINNY Rucks 04/20/2024, 4:23 PM

## 2024-04-20 NOTE — Assessment & Plan Note (Addendum)
 Septic arthritis right hip.  Chronic infection left femur osteomyelitis, sp Girdlestone procedure)  Patient has been on vancomycin  and cefepime  on HD Her lactic acid 0,6, CRP elevated at 2.8  No leukocytosis or fevers.   Left femur culture from 12/2023 with no organism seen, and no growth.  Light candida albicans growth.   Right hip aspiration IR aspiration on last hospitalization at [facility] was dry tap, no cultures able to obtain.   Patient on chronic fluconazole  planned through February 2026.  Will check with ID if potentially can change cefepime 

## 2024-04-20 NOTE — Progress Notes (Signed)
 Progress Note  Patient Name: [name] Date of Encounter: 04/20/2024  Primary Cardiologist: Vishnu P Mallipeddi, MD  Interval Summary  Chart reviewed including cardiology consultation from yesterday.  Patient awake and alert this morning, her daughter is in the room as well.  She reports a right lateral thoracic discomfort, fairly mild at this time and difficult for her to describe.  Echocardiogram is pending.  Vital Signs  Vitals:   04/20/24 0506 04/20/24 0700 04/20/24 0756 04/20/24 0800  BP:  137/75  (!) 166/73  Pulse: 81 76  75  Resp: 20 17  19   Temp:   98 F (36.7 C)   TempSrc:   Oral   SpO2: 99% 100%  95%  Weight:      Height:        Intake/Output Summary (Last 24 hours) at 04/20/2024 0843 Last data filed at 04/20/2024 0834 Gross per 24 hour  Intake 289.72 ml  Output 100 ml  Net 189.72 ml   Filed Weights   04/19/24 1456  Weight: 56.6 kg    Physical Exam  GEN: No acute distress.   Neck: No JVD. Cardiac: RRR, 1/6 systolic murmur.  Respiratory: Nonlabored. Clear to auscultation bilaterally. GI: Soft, nontender, bowel sounds present. MS: No edema. Psych: Alert and oriented x 3. Normal affect.  ECG/Telemetry  ECG from 04/19/2024 shows sinus rhythm with incomplete right branch block and left anterior fascicular block, probable left atrial enlargement.  Labs  Chemistry Recent Labs  Lab 04/19/24 1518 04/20/24 0513  NA  --  136  K  --  4.2  CL  --  98  CO2  --  26  GLUCOSE  --  80  BUN  --  17  CREATININE  --  3.38*  CALCIUM   --  9.4  PROT 7.3  --   ALBUMIN  3.6 3.7  AST 15  --   ALT 7  --   ALKPHOS 75  --   BILITOT 0.5  --   GFRNONAA  --  15*  ANIONGAP  --  12    Hematology Recent Labs  Lab 04/19/24 1518 04/20/24 0513  WBC 6.1 4.7  RBC 3.46* 3.57*  HGB 9.5* 9.9*  HCT 31.9* 33.2*  MCV 92.2 93.0  MCH 27.5 27.7  MCHC 29.8* 29.8*  RDW 18.1* 18.1*  PLT 241 238    Lipid Panel     Component Value Date/Time   CHOL 114  12/04/2023 1146   TRIG 69 12/04/2023 1146   HDL 28 (L) 12/04/2023 1146   CHOLHDL 4.1 12/04/2023 1146   CHOLHDL 9.1 05/02/2014 2110   VLDL UNABLE TO CALCULATE IF TRIGLYCERIDE OVER 400 mg/dL 87/92/7984 7889   LDLCALC 71 12/04/2023 1146   LABVLDL 15 12/04/2023 1146    Cardiac Studies  Echocardiogram 01/09/2024 ([facility]): SUMMARY  The left ventricular size is normal with normal left ventricular wall thickness.  Left ventricular systolic function is low normal.  LV ejection fraction = 50-55%.  The left ventricular wall motion is normal.  Left ventricular filling pattern is prolonged relaxation.  The right ventricle is mild to moderately dilated.  The right ventricular systolic function is normal.  There is mild tricuspid regurgitation.  Estimated right ventricular systolic pressure is 24 mmHg.  The IVC is normal in size with an inspiratory collapse of greater than 50%, suggesting normal right  atrial pressure.  There is trivial pericardial effusion.  Compared to the prior study, there is probably no significant change.   Assessment &  Plan  1.  Demand ischemia, high-sensitivity troponin T levels flat in the 160s.  Patient reports an atypical, right lateral thoracic discomfort.  ECG reviewed without significant ST segment changes over baseline.  Do not suspect ACS at this time.  2.  Hypertensive urgency.  Blood pressure trend improving.  Did undergo hemodialysis yesterday.  She is on Norvasc  5 mg daily and Coreg  25 mg twice daily.  3.  Multiple comorbidities including ESRD on hemodialysis, seizure disorder, paraplegia with neurogenic bladder and suprapubic catheter in place, chronic femur osteomyelitis, chronic pressure ulcers, and encephalopathy.  Follow-up echocardiogram has been ordered by primary team.  She did have an echocardiogram done at [facility] back in August at which point LVEF was 50 to 55%.  Has been on IV nitroglycerin  mainly for blood pressure control, would try  and convert to all oral regimen if possible.  Focus on blood pressure control and continued treatment of chronic comorbidities.  Do not anticipate any invasive cardiac testing at this time given high risk of complications.  For questions or updates, please contact [facility] Please consult [email or web address] for contact info under   Signed, Jayson Sierras, MD  04/20/2024, 8:43 AM

## 2024-04-21 DIAGNOSIS — M009 Pyogenic arthritis, unspecified: Secondary | ICD-10-CM | POA: Diagnosis not present

## 2024-04-21 DIAGNOSIS — G8220 Paraplegia, unspecified: Secondary | ICD-10-CM | POA: Diagnosis not present

## 2024-04-21 DIAGNOSIS — G9341 Metabolic encephalopathy: Secondary | ICD-10-CM | POA: Diagnosis not present

## 2024-04-21 DIAGNOSIS — N186 End stage renal disease: Secondary | ICD-10-CM | POA: Diagnosis not present

## 2024-04-21 LAB — CBC
HCT: 30.3 % — ABNORMAL LOW (ref 36.0–46.0)
Hemoglobin: 9 g/dL — ABNORMAL LOW (ref 12.0–15.0)
MCH: 27.7 pg (ref 26.0–34.0)
MCHC: 29.7 g/dL — ABNORMAL LOW (ref 30.0–36.0)
MCV: 93.2 fL (ref 80.0–100.0)
Platelets: 243 K/uL (ref 150–400)
RBC: 3.25 MIL/uL — ABNORMAL LOW (ref 3.87–5.11)
RDW: 18.2 % — ABNORMAL HIGH (ref 11.5–15.5)
WBC: 5.6 K/uL (ref 4.0–10.5)
nRBC: 0 % (ref 0.0–0.2)

## 2024-04-21 LAB — BASIC METABOLIC PANEL WITH GFR
Anion gap: 14 (ref 5–15)
BUN: 28 mg/dL — ABNORMAL HIGH (ref 6–20)
CO2: 24 mmol/L (ref 22–32)
Calcium: 8.9 mg/dL (ref 8.9–10.3)
Chloride: 100 mmol/L (ref 98–111)
Creatinine, Ser: 5.02 mg/dL — ABNORMAL HIGH (ref 0.44–1.00)
GFR, Estimated: 9 mL/min — ABNORMAL LOW (ref >60)
Glucose, Bld: 78 mg/dL (ref 70–99)
Potassium: 4.8 mmol/L (ref 3.5–5.1)
Sodium: 138 mmol/L (ref 135–145)

## 2024-04-21 LAB — LEVETIRACETAM LEVEL: Levetiracetam Lvl: 4.4 ug/mL — ABNORMAL LOW (ref 10.0–40.0)

## 2024-04-21 LAB — HEPATITIS B SURFACE ANTIGEN: Hepatitis B Surface Ag: NONREACTIVE

## 2024-04-21 MED ORDER — LIDOCAINE-PRILOCAINE 2.5-2.5 % EX CREA: 1.0000 | TOPICAL_CREAM | CUTANEOUS | Status: DC | PRN

## 2024-04-21 MED ORDER — VANCOMYCIN HCL 500 MG/100ML IV SOLN: 500.0000 mg | INTRAVENOUS | Status: AC

## 2024-04-21 MED ORDER — LIDOCAINE HCL (PF) 1 % IJ SOLN: 5.0000 mL | INTRAMUSCULAR | Status: DC | PRN

## 2024-04-21 MED ORDER — HEPARIN SODIUM (PORCINE) 1000 UNIT/ML DIALYSIS: 1000.0000 [IU] | INTRAMUSCULAR | Status: DC | PRN

## 2024-04-21 MED ORDER — ASPIRIN 81 MG PO TBEC: 81.0000 mg | DELAYED_RELEASE_TABLET | Freq: Every day | ORAL | Status: DC

## 2024-04-21 MED ORDER — ALTEPLASE 2 MG IJ SOLR: 2.0000 mg | Freq: Once | INTRAMUSCULAR | Status: DC | PRN

## 2024-04-21 MED ORDER — PENTAFLUOROPROP-TETRAFLUOROETH EX AERO: 1.0000 | INHALATION_SPRAY | CUTANEOUS | Status: DC | PRN

## 2024-04-21 MED ORDER — SODIUM CHLORIDE 0.9 % IV SOLN: 1.0000 g | INTRAVENOUS | Status: AC

## 2024-04-21 NOTE — Progress Notes (Signed)
 Noted that pt may be receiving dialysis on day of discharge, contacted clinic [facility], who cannot fit a chair time in today or tomorrow due to holiday clinic schedule. Cannot divert.   Lavanda Rube Sanchez Dialysis Navigator [phone]

## 2024-04-21 NOTE — Discharge Summary (Signed)
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 muscular atrophy. Soft tissue Subcutaneous edema along the right hip and left upper thigh region. Suprapubic catheter is again noted. IMPRESSION: 1. 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 There is mild patchy edema like signal of the right femoral head without significant appreciable marrow edema of the acetabulum. Septic arthritis of the right hip can not be entirely excluded. 2. 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 Similar marrow edema of the visualized remaining left femoral shaft, suspicious for osteomyelitis. Similar edema and scalloping of the  left acetabulum likely reflects chronic infection. 3. Redemonstrated bony destructive changes of the lower coccyx with a similar-appearing adjacent 4.4 x 2.5 cm collection containing fluid and dependent calcifications. 4. Redemonstrated decubitus ulcer below  the right ischium extending to the level of the thinned inferior pubic ramus. Electronically Signed   By: Harrietta Sherry M.D.   On: 04/12/2024 16:55   CT CHEST ABDOMEN PELVIS WO CONTRAST Result Date: 04/12/2024 CLINICAL DATA:  Weakness, fevers, and chills EXAM: CT CHEST, ABDOMEN AND PELVIS WITHOUT CONTRAST TECHNIQUE: Multidetector CT imaging of the chest, abdomen and pelvis was performed following the standard protocol without IV contrast. RADIATION DOSE REDUCTION: This exam was performed according to the departmental dose-optimization program which includes automated exposure control, adjustment of the mA and/or kV according to patient size and/or use of iterative reconstruction technique. COMPARISON:  CT abdomen and pelvis dated 03/09/2024, CTA chest dated 11/20/2023 FINDINGS: CT CHEST FINDINGS Cardiovascular: Multichamber cardiomegaly, shifted into the left hemidiaphragm. No significant pericardial fluid/thickening. Great vessels are normal in course and caliber. Coronary artery calcification. Mediastinum/Nodes: Imaged thyroid gland without nodules meeting criteria for imaging follow-up by size. Normal esophagus. No pathologically enlarged axillary, supraclavicular, mediastinal, or hilar lymph nodes. Lungs/Pleura: The central airways are patent. Chronic dependent bilateral lower lobe atelectasis. No focal consolidation. No pneumothorax. Trace right pleural effusion. Musculoskeletal: Similar appearance of thoracic spinal hardware and anterolisthesis at T6-7 with compression deformity of T7. Old fracture deformity of the sternum. Chronic deformity of the right clavicle. CT ABDOMEN PELVIS FINDINGS Hepatobiliary: No focal hepatic lesions. No intra or extrahepatic biliary ductal dilation. Normal gallbladder. Pancreas: No focal lesions or main ductal dilation. Spleen: Normal in size without focal abnormality. Adrenals/Urinary Tract: Thickening of the adrenal glands without discrete nodule. Atrophic kidneys.  Scattered bilateral hypodensities, likely cysts. Decompressed urinary bladder with suprapubic catheter in-situ. Stomach/Bowel: Normal appearance of the stomach. No evidence of bowel wall thickening, distention, or inflammatory changes. Normal appendix. Vascular/Lymphatic: Aortic atherosclerosis. Increased size of 12 mm left external iliac lymph node (5: 476), previously 10 mm. Multiple additional prominent subcentimeter bilateral pelvic and iliac lymph nodes. Reproductive: No adnexal masses. Other: Small volume presacral edema.  No free air. Musculoskeletal: Chronically dislocated hips with findings of chronic osteomyelitis on the left. Persistent soft tissue ulceration along the posterior left proximal thigh and right buttock extending to the right posterior ischium. Increased size of large right hip joint effusion. Increased conspicuity of right femoral head irregularity and adjacent posterior right acetabulum. Unchanged peripherally calcified oval collection overlying the sacrococcygeal spine. Similar irregularity of the distal coccyx. Diffuse body wall edema. IMPRESSION: 1. Increased size of large right hip joint effusion with increased conspicuity of right femoral head irregularity and adjacent posterior right acetabulum, suspicious for osteomyelitis/septic arthritis. 2. Persistent soft tissue ulceration along the posterior left proximal thigh and right buttock extending to the right posterior ischium. 3. 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 4. Increased size of 12 mm left external iliac lymph node, previously 10 mm, likely reactive. Multiple additional prominent subcentimeter bilateral pelvic and iliac lymph nodes, likely reactive. 5. Trace right pleural effusion. 6.  Aortic Atherosclerosis (ICD10-I70.0). Electronically Signed   By: Limin  Xu M.D.   On: 04/12/2024 13:02   DG Chest Port 1 View Result Date: 04/12/2024 EXAM: 1 VIEW(S) XRAY OF THE CHEST 04/12/2024 10:58:34 AM  COMPARISON: AP radiograph of the chest dated 03/09/2024. CLINICAL HISTORY: Questionable sepsis - evaluate for abnormality. FINDINGS: LUNGS AND PLEURA: There is mild hazy opacification of the lung bases bilaterally.  The lung bases are significantly clearer than on the previous study. Findings likely represent resolving bibasilar pneumonia or pulmonary edema. There are small residual pleural effusions, also improved in the interim. No pneumothorax. HEART AND MEDIASTINUM: The heart is mildly enlarged. BONES AND SOFT TISSUES: The patient is status post posterolateral spinal fusion of the thoracic spine. IMPRESSION: 1. Mild hazy opacification of the lung bases bilaterally, significantly improved compared to the prior study, likely representing resolving bibasilar pneumonia or pulmonary edema. 2. Small residual pleural effusions, also improved in the interim. 3. Mildly enlarged heart. Electronically signed by: Evalene Coho MD 04/12/2024 11:32 AM EST RP Workstation: [id number]    Microbiology: Results for orders placed or performed during the hospital encounter of 04/19/24  Culture, blood (Routine x 2)     Status: None (Preliminary result)   Collection Time: 04/19/24  3:18 PM   Specimen: BLOOD  Result Value Ref Range Status   Specimen Description BLOOD BLOOD RIGHT ARM AEROBIC BOTTLE ONLY  Final   Special Requests Blood Culture adequate volume  Final   Culture   Final    NO GROWTH 2 DAYS Performed at [facility], [address] [place], [place] [address]    Report Status PENDING  Incomplete  Culture, blood (Routine x 2)     Status: None (Preliminary result)   Collection Time: 04/19/24  3:19 PM   Specimen: BLOOD  Result Value Ref Range Status   Specimen Description BLOOD BLOOD RIGHT ARM  Final   Special Requests   Final    Blood Culture adequate volume BOTTLES DRAWN AEROBIC AND ANAEROBIC   Culture   Final    NO GROWTH 2 DAYS Performed at [facility], [address], [place], [place] [address]     Report Status PENDING  Incomplete  Urine Culture     Status: Abnormal (Preliminary result)   Collection Time: 04/19/24  6:43 PM   Specimen: Urine, Clean Catch  Result Value Ref Range Status   Specimen Description   Final    URINE, CLEAN CATCH Performed at [facility], [address] [place], [place] [address]    Special Requests   Final    NONE Performed at [facility], [address], [place], [place] [address]    Culture 60,000 COLONIES/mL PSEUDOMONAS AERUGINOSA (A)  Final   Report Status PENDING  Incomplete  MRSA Next Gen by PCR, Nasal     Status: None   Collection Time: 04/19/24  9:41 PM   Specimen: Nasal Mucosa; Nasal Swab  Result Value Ref Range Status   MRSA by PCR Next Gen NOT DETECTED NOT DETECTED Final    Comment: (NOTE) The GeneXpert MRSA Assay (FDA approved for NASAL specimens only), is one component of a comprehensive MRSA colonization surveillance program. It is not intended to diagnose MRSA infection nor to guide or monitor treatment for MRSA infections. Test performance is not FDA approved in patients less than 10 years old. Performed at [facility], [address], [place], [place] [address]     Labs: CBC: Recent Labs  Lab 04/19/24 1518 04/20/24 0513 04/21/24 0422  WBC 6.1 4.7 5.6  NEUTROABS 3.9  --   --   HGB 9.5* 9.9* 9.0*  HCT 31.9* 33.2* 30.3*  MCV 92.2 93.0 93.2  PLT 241 238 243   Basic Metabolic Panel: Recent Labs  Lab 04/19/24 1518 04/20/24 0513 04/21/24 0422  NA  --  136 138  K  --  4.2 4.8  CL  --  98 100  CO2  --  26 24  GLUCOSE  --  80 78  BUN  --  17 28*  CREATININE  --  3.38* 5.02*  CALCIUM   --  9.4 8.9  MG 1.9  --   --   PHOS  --  4.8*  --    Liver Function Tests: Recent Labs  Lab 04/19/24 1518 04/20/24 0513  AST 15  --   ALT 7  --   ALKPHOS 75  --   BILITOT 0.5  --   PROT 7.3  --   ALBUMIN  3.6 3.7   CBG: Recent Labs  Lab 04/19/24 1528  GLUCAP 102*    Discharge time spent: greater than 30  minutes.  Signed: Alm Schneider, MD [facility] 04/21/2024

## 2024-04-21 NOTE — Progress Notes (Signed)
 Patient discharged home via EMS. Spoke to daughter Suzen to inform of transport; she will be home when patient arrives

## 2024-04-21 NOTE — Care Management Important Message (Signed)
 Important Message  Patient Details  Name: [name] MRN: [id number] Date of Birth: 08-15-1965   Important Message Given:  Yes - Medicare IM (copy left on bedside table)     [name] Ada 04/21/2024, 1:48 PM

## 2024-04-21 NOTE — TOC Transition Note (Signed)
 Transition of Care [facility]) - Discharge Note   Patient Details  Name: [name] MRN: [id number] Date of Birth: 1965/06/09  [facility] of [facility]) CM/SW Contact:  Hoy DELENA Bigness, LCSW Phone Number: 04/21/2024, 2:38 PM   Clinical Narrative:    Pt to return home with daughter. Pt to receive HHRN w/ [facility]. HH orders are in place. TOC signing off.    Final next level of care: Home w Home Health Services Barriers to Discharge: Barriers Resolved   Patient Goals and CMS Choice Patient states their goals for this hospitalization and ongoing recovery are:: Return home CMS Medicare.gov Compare Post Acute Care list provided to:: Patient Choice offered to / list presented to : Patient [place] ownership interest in [facility].provided to:: Patient    Discharge Placement                       Discharge Plan and Services Additional resources added to the After Visit Summary for                  DME Arranged: N/A DME Agency: NA         HH Agency: [facility]        Social Drivers of Health (SDOH) Interventions SDOH Screenings   Food Insecurity: No Food Insecurity (04/19/2024)  Housing: Low Risk  (04/19/2024)  Transportation Needs: No Transportation Needs (04/19/2024)  Utilities: Not At Risk (04/19/2024)  Depression (PHQ2-9): Low Risk  (11/20/2023)  Recent Concern: Depression (PHQ2-9) - High Risk (10/07/2023)  Tobacco Use: Low Risk  (04/19/2024)     Readmission Risk Interventions    04/21/2024    2:37 PM 04/20/2024   11:37 AM 03/11/2024   10:32 AM  Readmission Risk Prevention Plan  Transportation Screening Complete Complete Complete  Medication Review Oceanographer) Complete Complete Complete  PCP or Specialist appointment within 3-5 days of discharge Complete Complete Complete  HRI or Home Care Consult Complete  Complete  SW Recovery Care/Counseling Consult Complete Complete Complete  Palliative Care Screening  Not Applicable Not Applicable Not Applicable  Skilled Nursing Facility Not Applicable Not Applicable Not Applicable

## 2024-04-21 NOTE — Progress Notes (Signed)
   Progress Note  Patient Name: Alfreda Hammad Date of Encounter: 04/21/2024  Primary Cardiologist: Vishnu P Mallipeddi, MD  Chart reviewed.  Patient currently off floor for hemodialysis today.  She did have a follow-up echocardiogram showing normal LVEF at 55 to 60% with no regional wall motion abnormalities.  Systolics have been in the 150s and heart rate in the 70s in sinus rhythm by telemetry.  Continues on aspirin  81 mg daily, Norvasc  10 mg daily, and Coreg  25 mg twice daily.  Cardiac enzyme trend not consistent with ACS.  At this point would anticipate continued medical therapy, no invasive cardiac testing planned.  We will sign off.  For questions or updates, please contact [facility] Please consult [email or web address] for contact info under   Signed, Jayson Sierras, MD  04/21/2024, 9:06 AM

## 2024-04-21 NOTE — Progress Notes (Signed)
 Nephrology Follow-Up Consult note   Assessment/Recommendations: [name] is a/an 58 y.o. female with a past medical history significant for ESRD on HD, admitted for AMS.     Outpatient dialysis unit: [facility] dialysis prescription: MWF, 3hr , EDW 45kg, LUE AVF, Elisio 17h, BFR 400, DFR 500, 2/2.5 bath, UF profile 2  # Troponin elevation: Cardiology feels elevated troponin is likely related to demand.  Defer to cardiology   # AMS: Could be multifactorial.  Cefepime  may be playing a role.  Currently on ceftazidime .  Defer further management to primary   # ESRD: MWF schedule.  Continue dialysis today per schedule   # Volume/ hypertension: Blood pressure moderately elevated.  Plan for dialysis with ultrafiltration today.   # Anemia of Chronic Kidney Disease: Hb 9s, ESA here if remains admitted. No iron  given infections.   # Secondary Hyperparathyroidism/Hyperphosphatemia: Cont home calcitriol  and sensipar .  Phosphorus at goal.  Continue home binders   # Vascular access: No issues with AVG   # Additional recommendations: - Dose all meds for creatinine clearance < 10 ml/min  - Unless absolutely necessary, no MRIs with gadolinium.  - Implement save arm precautions.  Prefer needle sticks in the dorsum of the hands or wrists.  No blood pressure measurements in arm. - If blood transfusion is requested during hemodialysis sessions, please alert us  prior to the session.  - Use synthetic opioids (Fentanyl /Dilaudid ) if needed   Recommendations were discussed with the primary team.    [name] [facility] 04/21/2024 8:42 AM  ___________________________________________________________  CC: Altered mental status  Interval History/Subjective: Patient has no complaints.  Continues to be slightly confused.  Planning for dialysis today   Medications:  Current Facility-Administered Medications  Medication Dose Route Frequency  Provider Last Rate Last Admin   acetaminophen  (TYLENOL ) tablet 500 mg  500 mg Oral Q6H PRN Hall, Carole N, DO   500 mg at 04/20/24 0502   alteplase  (CATHFLO ACTIVASE ) injection 2 mg  2 mg Intracatheter Once PRN Player Jayson PARAS, MD       amLODipine  (NORVASC ) tablet 10 mg  10 mg Oral QPM Arrien, Elidia Sieving, MD   10 mg at 04/20/24 1117   aspirin  EC tablet 81 mg  81 mg Oral Daily Shona Terry SAILOR, DO   81 mg at 04/20/24 9063   carvedilol  (COREG ) tablet 25 mg  25 mg Oral BID Shona Terry N, DO   25 mg at 04/20/24 2101   cefTAZidime  (FORTAZ ) 1 g in sodium chloride  0.9 % 100 mL IVPB  1 g Intravenous Q M,W,F-HD Arrien, Elidia Sieving, MD       feeding supplement (ENSURE PLUS HIGH PROTEIN) liquid 237 mL  237 mL Oral BID BM Arrien, Mauricio Daniel, MD       heparin  injection 1,000 Units  1,000 Units Dialysis PRN Player Jayson PARAS, MD       heparin  injection 5,000 Units  5,000 Units Subcutaneous Q8H Shona Terry SAILOR, DO   5,000 Units at 04/21/24 9482   levETIRAcetam  (KEPPRA ) tablet 500 mg  500 mg Oral BID Shona Terry N, DO   500 mg at 04/20/24 2101   lidocaine  (PF) (XYLOCAINE ) 1 % injection 5 mL  5 mL Intradermal PRN Player Jayson PARAS, MD       lidocaine -prilocaine  (EMLA ) cream 1 Application  1 Application Topical PRN Arius Harnois J, MD       LORazepam  (ATIVAN ) injection 2 mg  2 mg Intravenous Q6H PRN Shona Terry SAILOR,  DO       melatonin tablet 5 mg  5 mg Oral QHS PRN Shona Laurence N, DO       multivitamin (RENA-VIT) tablet 1 tablet  1 tablet Oral QHS Arrien, Elidia Sieving, MD   1 tablet at 04/20/24 2101   pentafluoroprop-tetrafluoroeth (GEBAUERS) aerosol 1 Application  1 Application Topical PRN Alric Geise J, MD       polyethylene glycol (MIRALAX  / GLYCOLAX ) packet 17 g  17 g Oral Daily PRN Shona Laurence N, DO       prochlorperazine  (COMPAZINE ) injection 5 mg  5 mg Intravenous Q6H PRN Hall, Carole N, DO       vancomycin  (VANCOREADY) IVPB 500 mg/100 mL  500 mg Intravenous Q M,W,F-HD Arrien,  Elidia Sieving, MD       vancomycin  variable dose per unstable renal function (pharmacist dosing)   Does not apply See admin instructions Laron Agent, [facility]          Review of Systems: 10 systems reviewed and negative except per interval history/subjective  Physical Exam: Vitals:   04/21/24 0044 04/21/24 0422  BP: (!) 159/74 (!) 156/70  Pulse:    Resp:  20  Temp: 99 F (37.2 C) 99 F (37.2 C)  SpO2: 95% 95%   No intake/output data recorded.  Intake/Output Summary (Last 24 hours) at 04/21/2024 9157 Last data filed at 04/20/2024 1047 Gross per 24 hour  Intake 16.45 ml  Output --  Net 16.45 ml   Constitutional: well-appearing, no acute distress ENMT: ears and nose without scars or lesions, MMM CV: normal rate, no edema Respiratory: bilateral chest rise, normal work of breathing Gastrointestinal: soft, non-tender, no palpable masses or hernias Skin: no visible lesions or rashes Psych: alert, judgement/insight impaired, appropriate mood and affect   Test Results I personally reviewed new and old clinical labs and radiology tests Lab Results  Component Value Date   NA 138 04/21/2024   K 4.8 04/21/2024   CL 100 04/21/2024   CO2 24 04/21/2024   BUN 28 (H) 04/21/2024   CREATININE 5.02 (H) 04/21/2024   CALCIUM  8.9 04/21/2024   ALBUMIN  3.7 04/20/2024   PHOS 4.8 (H) 04/20/2024    CBC Recent Labs  Lab 04/19/24 1518 04/20/24 0513 04/21/24 0422  WBC 6.1 4.7 5.6  NEUTROABS 3.9  --   --   HGB 9.5* 9.9* 9.0*  HCT 31.9* 33.2* 30.3*  MCV 92.2 93.0 93.2  PLT 241 238 243

## 2024-04-21 NOTE — Procedures (Signed)
 Received patient in bed to unit.  Alert and oriented.  Informed consent signed and in chart.   Pt arrived to unit, accessed left AVF with no issues. During tx pt c/o back pain but refused any meds at that time. Assisted pt with repositioning to reduce back pain. Pt slept some of tx but woke up stating she was in a lot of pain and wanted to get off.   Informed pt she could receive tylenol  for pain but pt wanted to end treatment instead.  Pt signed AMA and tx was ended.   TX duration: 3hrs  Patient tolerated well.  Transported back to the room  Alert, without acute distress.  Hand-off given to patient's nurse.   Access used: Left AVF Access issues: None  Total UF removed: 1.5 L Medication(s) given: None  Post HD weight: 46.1kg Post HD VS: 158/77   Nathalee Smarr [facility]

## 2024-04-22 LAB — HEPATITIS B SURFACE ANTIBODY, QUANTITATIVE: Hep B S AB Quant (Post): 385 m[IU]/mL

## 2024-04-23 ENCOUNTER — Telehealth: Payer: Self-pay | Admitting: *Deleted

## 2024-04-23 NOTE — Transitions of Care (Post Inpatient/ED Visit) (Signed)
   04/23/2024  Name: [name] MRN: [id number] DOB: 1966/04/05  Today's TOC FU Call Status: Today's TOC FU Call Status:: Unsuccessful Call (1st Attempt) Unsuccessful Call (1st Attempt) Date: 04/23/24  Attempted to reach the patient regarding the most recent Inpatient/ED visit.  Follow Up Plan: Additional outreach attempts will be made to reach the patient to complete the Transitions of Care (Post Inpatient/ED visit) call.   Andrea Dimes RN, BSN [place]  [facility] RN Care Manager [phone]

## 2024-04-24 LAB — CULTURE, BLOOD (ROUTINE X 2)
Culture: NO GROWTH
Culture: NO GROWTH
Special Requests: ADEQUATE
Special Requests: ADEQUATE

## 2024-04-26 ENCOUNTER — Telehealth: Payer: Self-pay | Admitting: *Deleted

## 2024-04-26 NOTE — Progress Notes (Signed)
 Late note entry 12/1 0855  D/c over weekend/holiday noted. D/c summary and last neph note faxed [facility] at this time. No further support needed.   Lavanda Hanae Waiters Dialysis Navigator [phone]

## 2024-04-26 NOTE — Transitions of Care (Post Inpatient/ED Visit) (Signed)
 04/26/2024  Name: [name] MRN: [id number] DOB: [birth year]  Today'[name] TOC FU Call Status: Today'[name] TOC FU Call Status:: Successful TOC FU Call Completed TOC FU Call Complete Date: 04/26/24  Patient'[name] Name and Date of Birth confirmed. Name, DOB  Transition Care Management Follow-up Telephone Call Date of Discharge: 04/21/24 Discharge Facility: [name] ([facility]) Type of Discharge: Inpatient Admission Primary Inpatient Discharge Diagnosis:: Acute metabolic encephalopathy How have you been since you were released from the hospital?: Better Any questions or concerns?: No  Items Reviewed: Did you receive and understand the discharge instructions provided?: Yes Medications obtained,verified, and reconciled?: No Medications Not Reviewed Reasons:: Other: (Patient was at dialysis and unable to complete medication review.) Any new allergies since your discharge?: No Dietary orders reviewed?: Yes Type of Diet Ordered:: Heart Healthy Do you have support at home?: Yes People in Home [RPT]: child(ren), adult Name of Support/Comfort Primary Source: [name]/[name]  Medications Reviewed Today: Medications Reviewed Today     Reviewed by [name] LABOR, RN (Registered Nurse) on 04/26/24 at 1531  Med List Status: <None>   Medication Order Taking? Sig Documenting Provider Last Dose Status Informant  acetaminophen  (TYLENOL ) 500 MG tablet [id number] No Take 1,000 mg by mouth every 6 (six) hours as needed for moderate pain. Provider, Historical, [name] Unknown Active Child, Pharmacy Records  albuterol  (PROVENTIL ) (2.5 MG/3ML) 0.083% nebulizer solution [id number] No Take 3 mLs (2.5 mg total) by nebulization every 4 (four) hours as needed for wheezing or shortness of breath. [name] Unknown Expired 04/20/24 2359 Child, Pharmacy Records  amLODipine  (NORVASC ) 10 MG tablet [id number] No Take 10 mg by mouth daily. Provider, Historical, [name] Past Week Active Child, Pharmacy Records  aspirin  EC  81 MG tablet [id number]  Take 1 tablet (81 mg total) by mouth daily. Swallow whole. [name]  Active   B Complex-C-Folic Acid  (DIALYVITE TABLET) TABS [id number] No Take 1 tablet by mouth daily. Provider, Historical, [name] Past Week Active Child, Pharmacy Records  carvedilol  (COREG ) 25 MG tablet [id number] No Take 25 mg by mouth 2 (two) times daily. Provider, Historical, [name] Past Week Active Child, Pharmacy Records  cefTAZidime  1 g in sodium chloride  0.9 % 100 mL [id number]  Inject 1 g into the vein every Monday, Wednesday, and Friday with hemodialysis. Last dose on dialysis 05/14/2024 Tat, Lenis, [name]  Active   cyclobenzaprine  (FLEXERIL ) 10 MG tablet [id number] No Take 10 mg by mouth 3 (three) times daily as needed. Provider, Historical, [name] Unknown Active Child, Pharmacy Records  Darbepoetin Alfa  (ARANESP ) 200 MCG/0.4ML SOSY injection [id number] No Inject 0.4 mLs (200 mcg total) into the skin every Monday at 6 PM. [name] Unknown Active Child, Pharmacy Records  fluconazole  (DIFLUCAN ) 100 MG tablet [id number] No Take 200 mg by mouth daily.  Patient not taking: Reported on 04/20/2024   Provider, Historical, [name] Not Taking Active Child, Pharmacy Records  gabapentin  (NEURONTIN ) 100 MG capsule [id number] No Take 1 capsule (100 mg total) by mouth 2 (two) times daily. [name] Past Week Active Child, Pharmacy Records  hydrALAZINE  (APRESOLINE ) 50 MG tablet [id number]  Take 50 mg by mouth 3 (three) times daily. Provider, Historical, [name]  Active Child, Pharmacy Records           Med Note [name]   Tue Apr 20, 2024 11:33 AM) Pt has not started  hydrocortisone  2.5 % cream [id number] No Apply 1 Application topically 2 (two) times daily. Provider, Historical, [name] Unknown Active Child, Pharmacy Records  hydrOXYzine  (ATARAX ) 10 MG tablet [id number] No Take 10 mg by mouth every 8 (eight) hours as needed for itching. Provider, Historical, [name] Unknown Active Child, Pharmacy Records  irbesartan  (AVAPRO )  150 MG tablet [id number] No Take 150 mg by mouth every evening. Provider, Historical, [name] Past Week Active Child, Pharmacy Records  levETIRAcetam  (KEPPRA ) 1000 MG tablet [id number] No Take 1,000 mg by mouth at bedtime. Provider, Historical, [name] Past Week Active Child, Pharmacy Records  lidocaine -prilocaine  (EMLA ) cream [id number] No Apply 1 Application topically 3 (three) times a week. Provider, Historical, [name] Past Week Active Child, Pharmacy Records  ondansetron  (ZOFRAN ) 4 MG tablet [id number]  Take 1 tablet (4 mg total) by mouth every 6 (six) hours. [name]  Active Child, Pharmacy Records  ondansetron  (ZOFRAN -ODT) 4 MG disintegrating tablet [id number] No 4mg  ODT q4 hours prn nausea/vomit [name] Unknown Active Child, Pharmacy Records  oxyCODONE  (OXY IR/ROXICODONE ) 5 MG immediate release tablet [id number]  Take 2.5 mg by mouth every 6 (six) hours as needed for moderate pain (pain score 4-6). Provider, Historical, [name]  Active Child, Pharmacy Records           Med Note [name]   Tue Apr 20, 2024 11:30 AM) Pt has not started  pantoprazole  (PROTONIX ) 40 MG tablet [id number] No Take 40 mg by mouth daily. Provider, Historical, [name] Past Week Active Child, Pharmacy Records  senna-docusate (SENOKOT-[name]) 8.6-50 MG tablet [id number] No Take 2 tablets by mouth at bedtime. [name] Past Week Active Child, Pharmacy Records  vancomycin  [facility]) 500 MG/100ML IVPB [id number]  Inject 100 mLs (500 mg total) into the vein every Monday, Wednesday, and Friday with hemodialysis. Last dose on 05/14/2024 [name]  Active   Med List Note ([name], [name] 09/23/23 1453): [name]'[name] [name] [phone] &  [phone]) Dialysis Monday, Wednesday, Friday - [address] [place]** [name] is the best historian for medication history per patient Son [name] [phone])             Home Care and Equipment/Supplies: Were Home Health Services Ordered?: Yes Name of Home  Health Agency:: Patient will resume with [facility] when she receives ordered wound vac supplies. Patient has the contact information and was working with [facility] prior to admission Any new equipment or medical supplies ordered?: No  Functional Questionnaire: Do you need assistance with bathing/showering or dressing?: No Do you need assistance with meal preparation?: Yes ([name] assists) Do you need assistance with eating?: No Do you have difficulty maintaining continence: Yes ([name] assists) Do you need assistance with getting out of bed/getting out of a chair/moving?: Yes ([name] assists) Do you have difficulty managing or taking your medications?: Yes ([name] assists)  Follow up appointments reviewed: PCP Follow-up appointment confirmed?: No (Patient will call today to schedule) [name] Provider Line [phone] Given: No Specialist Hospital Follow-up appointment confirmed?: NA Do you need transportation to your follow-up appointment?: No Do you understand care options if your condition([name]) worsen?: Yes-patient verbalized understanding  SDOH Interventions Today    Flowsheet Row Most Recent Value  SDOH Interventions   Food Insecurity Interventions Intervention Not Indicated  Housing Interventions Intervention Not Indicated  Transportation Interventions Intervention Not Indicated  Utilities Interventions Intervention Not Indicated    [name] Dimes RN, BSN [place]  [facility] RN Care Manager [phone]

## 2024-04-27 ENCOUNTER — Other Ambulatory Visit: Payer: Self-pay

## 2024-04-27 DIAGNOSIS — R112 Nausea with vomiting, unspecified: Secondary | ICD-10-CM

## 2024-04-27 MED ORDER — ONDANSETRON HCL 4 MG PO TABS: 4.0000 mg | ORAL_TABLET | Freq: Four times a day (QID) | ORAL | 0 refills | Status: AC

## 2024-04-30 LAB — URINE CULTURE: Culture: 60000 — AB

## 2024-04-30 LAB — SUSCEPTIBILITY RESULT

## 2024-04-30 LAB — SUSCEPTIBILITY, AER + ANAEROB

## 2024-05-03 ENCOUNTER — Emergency Department (HOSPITAL_COMMUNITY)
Admission: EM | Admit: 2024-05-03 | Discharge: 2024-05-04 | Disposition: A | Attending: Emergency Medicine | Admitting: Emergency Medicine

## 2024-05-03 ENCOUNTER — Encounter (HOSPITAL_COMMUNITY): Payer: Self-pay | Admitting: Emergency Medicine

## 2024-05-03 ENCOUNTER — Other Ambulatory Visit: Payer: Self-pay

## 2024-05-03 ENCOUNTER — Emergency Department (HOSPITAL_COMMUNITY)

## 2024-05-03 DIAGNOSIS — Z992 Dependence on renal dialysis: Secondary | ICD-10-CM | POA: Diagnosis not present

## 2024-05-03 DIAGNOSIS — E875 Hyperkalemia: Secondary | ICD-10-CM

## 2024-05-03 DIAGNOSIS — Z7982 Long term (current) use of aspirin: Secondary | ICD-10-CM | POA: Diagnosis not present

## 2024-05-03 DIAGNOSIS — R059 Cough, unspecified: Secondary | ICD-10-CM | POA: Diagnosis present

## 2024-05-03 DIAGNOSIS — N186 End stage renal disease: Secondary | ICD-10-CM | POA: Diagnosis not present

## 2024-05-03 DIAGNOSIS — R051 Acute cough: Secondary | ICD-10-CM

## 2024-05-03 DIAGNOSIS — R0602 Shortness of breath: Secondary | ICD-10-CM | POA: Diagnosis not present

## 2024-05-03 LAB — HEPATITIS B SURFACE ANTIGEN: Hepatitis B Surface Ag: NONREACTIVE

## 2024-05-03 LAB — RESP PANEL BY RT-PCR (RSV, FLU A&B, COVID)  RVPGX2
Influenza A by PCR: NEGATIVE
Influenza B by PCR: NEGATIVE
Resp Syncytial Virus by PCR: NEGATIVE
SARS Coronavirus 2 by RT PCR: NEGATIVE

## 2024-05-03 LAB — CBC WITH DIFFERENTIAL/PLATELET
Abs Immature Granulocytes: 0.02 K/uL (ref 0.00–0.07)
Basophils Absolute: 0.1 K/uL (ref 0.0–0.1)
Basophils Relative: 1 %
Eosinophils Absolute: 0.3 K/uL (ref 0.0–0.5)
Eosinophils Relative: 6 %
HCT: 32.1 % — ABNORMAL LOW (ref 36.0–46.0)
Hemoglobin: 9.4 g/dL — ABNORMAL LOW (ref 12.0–15.0)
Immature Granulocytes: 0 %
Lymphocytes Relative: 25 %
Lymphs Abs: 1.2 K/uL (ref 0.7–4.0)
MCH: 28.7 pg (ref 26.0–34.0)
MCHC: 29.3 g/dL — ABNORMAL LOW (ref 30.0–36.0)
MCV: 98.2 fL (ref 80.0–100.0)
Monocytes Absolute: 0.4 K/uL (ref 0.1–1.0)
Monocytes Relative: 8 %
Neutro Abs: 2.7 K/uL (ref 1.7–7.7)
Neutrophils Relative %: 60 %
Platelets: 148 K/uL — ABNORMAL LOW (ref 150–400)
RBC: 3.27 MIL/uL — ABNORMAL LOW (ref 3.87–5.11)
RDW: 19.7 % — ABNORMAL HIGH (ref 11.5–15.5)
WBC: 4.6 K/uL (ref 4.0–10.5)
nRBC: 0 % (ref 0.0–0.2)

## 2024-05-03 LAB — BASIC METABOLIC PANEL WITH GFR
Anion gap: 12 (ref 5–15)
BUN: 89 mg/dL — ABNORMAL HIGH (ref 6–20)
CO2: 24 mmol/L (ref 22–32)
Calcium: 9 mg/dL (ref 8.9–10.3)
Chloride: 99 mmol/L (ref 98–111)
Creatinine, Ser: 8.07 mg/dL — ABNORMAL HIGH (ref 0.44–1.00)
GFR, Estimated: 5 mL/min — ABNORMAL LOW (ref >60)
Glucose, Bld: 111 mg/dL — ABNORMAL HIGH (ref 70–99)
Potassium: 7.5 mmol/L (ref 3.5–5.1)
Sodium: 136 mmol/L (ref 135–145)

## 2024-05-03 MED ORDER — INSULIN ASPART 100 UNIT/ML IV SOLN
5.0000 [IU] | Freq: Once | INTRAVENOUS | Status: AC
  Administered 2024-05-03: 5 [IU] via INTRAVENOUS
  Filled 2024-05-03: qty 1

## 2024-05-03 MED ORDER — HEPARIN SODIUM (PORCINE) 1000 UNIT/ML DIALYSIS: 1000.0000 [IU] | INTRAMUSCULAR | Status: DC | PRN

## 2024-05-03 MED ORDER — ALTEPLASE 2 MG IJ SOLR: 2.0000 mg | Freq: Once | INTRAMUSCULAR | Status: DC | PRN

## 2024-05-03 MED ORDER — ANTICOAGULANT SODIUM CITRATE 4% (200MG/5ML) IV SOLN: 5.0000 mL | Status: DC | PRN

## 2024-05-03 MED ORDER — CALCIUM GLUCONATE-NACL 1-0.675 GM/50ML-% IV SOLN
1.0000 g | Freq: Once | INTRAVENOUS | Status: AC
  Administered 2024-05-03: 1000 mg via INTRAVENOUS
  Filled 2024-05-03: qty 50

## 2024-05-03 MED ORDER — LIDOCAINE HCL (PF) 1 % IJ SOLN: 5.0000 mL | INTRAMUSCULAR | Status: DC | PRN

## 2024-05-03 MED ORDER — PENTAFLUOROPROP-TETRAFLUOROETH EX AERO: 1.0000 | INHALATION_SPRAY | CUTANEOUS | Status: DC | PRN

## 2024-05-03 MED ORDER — SODIUM ZIRCONIUM CYCLOSILICATE 5 G PO PACK
10.0000 g | PACK | Freq: Once | ORAL | Status: AC
  Administered 2024-05-03: 10 g via ORAL
  Filled 2024-05-03: qty 2

## 2024-05-03 MED ORDER — DEXTROSE 50 % IV SOLN
1.0000 | Freq: Once | INTRAVENOUS | Status: AC
  Administered 2024-05-03: 50 mL via INTRAVENOUS
  Filled 2024-05-03: qty 50

## 2024-05-03 MED ORDER — LIDOCAINE-PRILOCAINE 2.5-2.5 % EX CREA: 1.0000 | TOPICAL_CREAM | CUTANEOUS | Status: DC | PRN

## 2024-05-03 NOTE — ED Notes (Signed)
Report given to dialysis nurse.

## 2024-05-03 NOTE — Progress Notes (Signed)
 The patient requested to discontinue HD tx three times. The patient was educated as follow:High potassium levels are dangerous and can affect cardiac function, Therefor, the patient understood the rationale and completed the HD treatment. The patient reported eating bananas, which contributed to rise in potassium. She complained of pain at needle site, causing the BFR drop to 300 ml/min. The patient has already called her daughter to pick her up from ED room.  05/03/24 2106  Vitals  Temp 98 F (36.7 C)  Temp Source Oral  BP 130/66  BP Location Right Wrist  BP Method Automatic  Patient Position (if appropriate) Lying  Pulse Rate 92  Resp 20  Weight 51.7 kg  Type of Weight Post-Dialysis  Oxygen Therapy  SpO2 100 %  O2 Device Nasal Cannula  O2 Flow Rate (L/min) 2 L/min  During Treatment Monitoring  Intra-Hemodialysis Comments Tx completed  Post Treatment  Dialyzer Clearance Lightly streaked  Hemodialysis Intake (mL) 0 mL  Liters Processed 70  Fluid Removed (mL) 2700 mL  Tolerated HD Treatment Yes  Post-Hemodialysis Comments see note  AVG/AVF Arterial Site Held (minutes) 5 minutes  AVG/AVF Venous Site Held (minutes) 5 minutes  Fistula / Graft Left Upper arm Arteriovenous fistula  No placement date or time found.   Placed prior to admission: Yes  Orientation: Left  Access Location: Upper arm  Access Type: Arteriovenous fistula  Site Condition No complications  Fistula / Graft Assessment Present;Thrill;Bruit  Status Deaccessed  Needle Size 15  Drainage Description None

## 2024-05-03 NOTE — ED Triage Notes (Signed)
 Pt bib [facility] w/ c/o sob, cough. Pt was supposed to have dialysis today and Friday, but was canceled by providing services.

## 2024-05-03 NOTE — ED Notes (Addendum)
 Patient back in ED from dialysis. Patient's skin warm and dry, respirations even and unlabored, foley bag draining with no dependent loops.  Patient denies complaints at this time.

## 2024-05-03 NOTE — ED Provider Notes (Signed)
  EMERGENCY DEPARTMENT AT [facility] Provider Note   CSN: [id number] Arrival date & time: 05/03/24  9042     Patient presents with: Shortness of Breath   [name] is a 58 y.o. female.  She is brought in by ambulance from home for shortness of breath cough.  She said it has been going on a couple of days.  Cough has been nonproductive.  No fever.  No chest pain.  She is dialysis dependent Monday Wednesday Friday.  She did not go Friday due to the [place] being canceled and did not go today due to problems at the dialysis center.  She uses oxygen as needed, mostly at night.  No sick contacts.   The history is provided by the patient.  Shortness of Breath Severity:  Moderate Onset quality:  Gradual Duration:  2 days Timing:  Constant Progression:  Unchanged Chronicity:  Recurrent Relieved by:  Nothing Associated symptoms: cough   Associated symptoms: no abdominal pain, no chest pain, no fever, no hemoptysis, no sputum production, no vomiting and no wheezing   Risk factors: no tobacco use        Prior to Admission medications   Medication Sig Start Date End Date Taking? Authorizing Provider  acetaminophen  (TYLENOL ) 500 MG tablet Take 1,000 mg by mouth every 6 (six) hours as needed for moderate pain.    [provider]  albuterol  (PROVENTIL ) (2.5 MG/3ML) 0.083% nebulizer solution Take 3 mLs (2.5 mg total) by nebulization every 4 (four) hours as needed for wheezing or shortness of breath. 04/15/23 04/20/24  Pearlean Manus, MD  amLODipine  (NORVASC ) 10 MG tablet Take 10 mg by mouth daily. 03/18/24   [provider]  aspirin  EC 81 MG tablet Take 1 tablet (81 mg total) by mouth daily. Swallow whole. 04/22/24   Evonnie Lenis, MD  B Complex-C-Folic Acid  (DIALYVITE TABLET) TABS Take 1 tablet by mouth daily. 02/27/24   [provider]  carvedilol  (COREG ) 25 MG tablet Take 25 mg by mouth 2 (two) times daily. 02/03/24   [provider]   cefTAZidime  1 g in sodium chloride  0.9 % 100 mL Inject 1 g into the vein every Monday, Wednesday, and Friday with hemodialysis. Last dose on dialysis 05/14/2024 04/21/24   Tat, Lenis, MD  cyclobenzaprine  (FLEXERIL ) 10 MG tablet Take 10 mg by mouth 3 (three) times daily as needed. 02/26/24   [provider]  Darbepoetin Alfa  (ARANESP ) 200 MCG/0.4ML SOSY injection Inject 0.4 mLs (200 mcg total) into the skin every Monday at 6 PM. 03/15/24   Ricky Fines, MD  fluconazole  (DIFLUCAN ) 100 MG tablet Take 200 mg by mouth daily. Patient not taking: Reported on 04/20/2024 01/29/24   [provider]  gabapentin  (NEURONTIN ) 100 MG capsule Take 1 capsule (100 mg total) by mouth 2 (two) times daily. 03/17/24   Tobie Suzzane POUR, MD  hydrALAZINE  (APRESOLINE ) 50 MG tablet Take 50 mg by mouth 3 (three) times daily. 04/17/24   [provider]  hydrocortisone  2.5 % cream Apply 1 Application topically 2 (two) times daily. 01/29/24   [provider]  hydrOXYzine  (ATARAX ) 10 MG tablet Take 10 mg by mouth every 8 (eight) hours as needed for itching. 01/29/24   [provider]  irbesartan  (AVAPRO ) 150 MG tablet Take 150 mg by mouth every evening. 02/03/24   [provider]  levETIRAcetam  (KEPPRA ) 1000 MG tablet Take 1,000 mg by mouth at bedtime.    [provider]  lidocaine -prilocaine  (EMLA ) cream Apply  1 Application topically 3 (three) times a week. 06/04/22   [provider]  ondansetron  (ZOFRAN ) 4 MG tablet Take 1 tablet (4 mg total) by mouth every 6 (six) hours. 04/27/24   Tobie Suzzane POUR, MD  ondansetron  (ZOFRAN -ODT) 4 MG disintegrating tablet 4mg  ODT q4 hours prn nausea/vomit 03/09/24   Zammit, Joseph, MD  oxyCODONE  (OXY IR/ROXICODONE ) 5 MG immediate release tablet Take 2.5 mg by mouth every 6 (six) hours as needed for moderate pain (pain score 4-6). 04/17/24   [provider]  pantoprazole  (PROTONIX ) 40 MG tablet Take 40 mg by mouth daily.  03/18/24   [provider]  senna-docusate (SENOKOT-S) 8.6-50 MG tablet Take 2 tablets by mouth at bedtime. 11/24/23   Pearlean Manus, MD  vancomycin  (VANCOREADY) 500 MG/100ML IVPB Inject 100 mLs (500 mg total) into the vein every Monday, Wednesday, and Friday with hemodialysis. Last dose on 05/14/2024 04/21/24   Evonnie Lenis, MD    Allergies: Other, Bactoshield chg [chlorhexidine  gluconate], Cefepime , Linezolid, Moxifloxacin, Quinine derivatives, Vancomycin , Azithromycin , Benadryl [diphenhydramine hcl (sleep)], Daptomycin, Piperacillin , Tetracycline, and Zosyn  [piperacillin  sod-tazobactam so]    Review of Systems  Constitutional:  Negative for fever.  Respiratory:  Positive for cough and shortness of breath. Negative for hemoptysis, sputum production and wheezing.   Cardiovascular:  Negative for chest pain.  Gastrointestinal:  Negative for abdominal pain and vomiting.    Updated Vital Signs BP (!) 153/81 (BP Location: Right Arm)   Pulse 73   Temp (!) 97.5 F (36.4 C) (Oral)   Resp 19   Ht 5' 1 (1.549 m)   Wt 54.4 kg   LMP  (LMP Unknown)   SpO2 96%   BMI 22.67 kg/m   Physical Exam Vitals and nursing note reviewed.  Constitutional:      General: She is not in acute distress.    Appearance: She is well-developed.  HENT:     Head: Normocephalic and atraumatic.  Eyes:     Conjunctiva/sclera: Conjunctivae normal.  Cardiovascular:     Rate and Rhythm: Normal rate and regular rhythm.     Heart sounds: No murmur heard. Pulmonary:     Effort: Pulmonary effort is normal. No respiratory distress.     Breath sounds: Normal breath sounds.  Abdominal:     Palpations: Abdomen is soft.     Tenderness: There is no abdominal tenderness.  Musculoskeletal:        General: No swelling.     Cervical back: Neck supple.     Right lower leg: No edema.     Left lower leg: No edema.  Skin:    General: Skin is warm and dry.     Capillary Refill: Capillary refill takes less than 2  seconds.  Neurological:     Mental Status: She is alert.     Comments: She has no use of her lower extremities.     (all labs ordered are listed, but only abnormal results are displayed) Labs Reviewed  BASIC METABOLIC PANEL WITH GFR - Abnormal; Notable for the following components:      Result Value   Potassium 7.5 (*)    Glucose, Bld 111 (*)    BUN 89 (*)    Creatinine, Ser 8.07 (*)    GFR, Estimated 5 (*)    All other components within normal limits  CBC WITH DIFFERENTIAL/PLATELET - Abnormal; Notable for the following components:   RBC 3.27 (*)    Hemoglobin 9.4 (*)    HCT 32.1 (*)  MCHC 29.3 (*)    RDW 19.7 (*)    Platelets 148 (*)    All other components within normal limits  RESP PANEL BY RT-PCR (RSV, FLU A&B, COVID)  RVPGX2  HEPATITIS B SURFACE ANTIGEN  HEPATITIS B SURFACE ANTIBODY, QUANTITATIVE  CBC    EKG: EKG Interpretation Date/Time:  Monday May 03 2024 10:15:41 EST Ventricular Rate:  73 PR Interval:  154 QRS Duration:  125 QT Interval:  429 QTC Calculation: 473 R Axis:   -82  Text Interpretation: Sinus rhythm Probable left atrial enlargement RBBB and LAFB inproved ischemic changes from prior 11/25 Confirmed by Towana Sharper [phone]) on 05/03/2024 10:18:06 AM  Radiology: [facility] Chest Port 1 View Result Date: 05/03/2024 EXAM: 1 VIEW(S) XRAY OF THE CHEST 05/03/2024 10:40:24 AM COMPARISON: 04/19/2024 CLINICAL HISTORY: cough sob FINDINGS: LUNGS AND PLEURA: Small bilateral pleural effusions increased from the previous exam. Right pleural effusion greater than left. No focal pulmonary opacity. No pneumothorax. HEART AND MEDIASTINUM: Cardiomegaly. Atherosclerotic calcifications. BONES AND SOFT TISSUES: Intact thoracic spinal fixation hardware. No acute osseous abnormality. IMPRESSION: 1. Small bilateral pleural effusions, increased from the previous exam, right greater than left. 2. Cardiomegaly. Electronically signed by: Norleen Boxer MD 05/03/2024 11:41 AM EST RP  Workstation: [id number]     .Critical Care  Performed by: Towana Sharper BROCKS, MD Authorized by: Towana Sharper BROCKS, MD   Critical care provider statement:    Critical care time (minutes):  45   Critical care time was exclusive of:  Separately billable procedures and treating other patients   Critical care was necessary to treat or prevent imminent or life-threatening deterioration of the following conditions:  Renal failure and metabolic crisis   Critical care was time spent personally by me on the following activities:  Development of treatment plan with patient or surrogate, discussions with consultants, evaluation of patient's response to treatment, examination of patient, obtaining history from patient or surrogate, ordering and performing treatments and interventions, ordering and review of laboratory studies, ordering and review of radiographic studies, pulse oximetry, re-evaluation of patient's condition and review of old charts   I assumed direction of critical care for this patient from another provider in my specialty: no      Medications Ordered in the ED  pentafluoroprop-tetrafluoroeth (GEBAUERS) aerosol 1 Application (has no administration in time range)  lidocaine  (PF) (XYLOCAINE ) 1 % injection 5 mL (has no administration in time range)  lidocaine -prilocaine  (EMLA ) cream 1 Application (has no administration in time range)  heparin  injection 1,000 Units (has no administration in time range)  anticoagulant sodium citrate  solution 5 mL (has no administration in time range)  alteplase  (CATHFLO ACTIVASE ) injection 2 mg (has no administration in time range)  sodium zirconium cyclosilicate  (LOKELMA ) packet 10 g (10 g Oral Given 05/03/24 1125)  calcium  gluconate 1 g/ 50 mL sodium chloride  IVPB (0 mg Intravenous Stopped 05/03/24 1504)  insulin  aspart (novoLOG ) injection 5 Units (5 Units Intravenous Given 05/03/24 1125)    And  dextrose  50 % solution 50 mL (50 mLs Intravenous Given 05/03/24  1125)    Clinical Course as of 05/03/24 1713  Mon May 03, 2024  1107 Potassium testing came back at 7.5.  Have ordered treatment for hyperkalemia and put in a consult for nephrology. [MB]  1107 Chest x-ray with significant rotation.  No clear infiltrate.  Awaiting radiology reading. [MB]  1120 Discussed with Dr. Dennise nephrology who would work on getting the patient dialysis. [MB]    Clinical Course User Index [MB] Towana,  Ozell BROCKS, MD                                 Medical Decision Making Amount and/or Complexity of Data Reviewed Labs: ordered. Radiology: ordered.  Risk OTC drugs. Prescription drug management.   This patient complains of cough and shortness of breath; this involves an extensive number of treatment Options and is a complaint that carries with it a high risk of complications and morbidity. The differential includes fluid overload, CHF, pneumonia, COVID, flu, pneumothorax  I ordered, reviewed and interpreted labs, which included CBC with normal white count chronically low hemoglobin, chemistries with critically elevated potassium, COVID and flu negative I ordered medication medication for hyperkalemia and reviewed PMP when indicated. I ordered imaging studies which included chest x-ray and I independently    visualized and interpreted imaging which showed cardiomegaly Additional history obtained from EMS Previous records obtained and reviewed in epic including recent discharge summary I consulted nephrology Dr. Dennise and discussed lab and imaging findings and discussed disposition.  Cardiac monitoring reviewed, sinus rhythm Social determinants considered, no significant barriers Critical Interventions: Medications for treatment of hyperkalemia and arranging for dialysis  After the interventions stated above, I reevaluated the patient and found patient to be resting comfortably on nasal cannula oxygen in no distress Admission and further testing considered, her  care is signed out to Dr. Suzette to reassess once returns from dialysis.  Potentially could be discharged if back to baseline.      Final diagnoses:  Acute cough  Hyperkalemia  End stage renal disease [facility])    ED Discharge Orders     None          Towana Ozell BROCKS, MD 05/03/24 [phone]

## 2024-05-03 NOTE — Progress Notes (Signed)
 Brief Note Paged by Dr. Towana. Discussed case. Patient presents with SOB, cough. Missed HD Friday. K 7.5 currently. Plan for HD today. Discussed with [facility] RN. Possible discharge post dialysis from ER. Please let us  know if patient is to be admitted. If staying, will be seen tomorrow AM.  Ephriam Stank, MD [facility]

## 2024-05-03 NOTE — ED Provider Notes (Signed)
 Patient has returned from getting her dialysis in the hospital.  She states her shortness of breath has improved.  She will be discharged home and will continue her dialysis as an outpatient   Suzette Pac, MD 05/03/24 2201

## 2024-05-04 LAB — HEPATITIS B SURFACE ANTIBODY, QUANTITATIVE: Hep B S AB Quant (Post): 309 m[IU]/mL

## 2024-05-04 NOTE — ED Provider Notes (Signed)
 Signout from Dr. Lorette.  58 year old female seen by me yesterday for shortness of breath.  She received dialysis yesterday.  Plan is to discharge her back to home.  Awaiting ambulance transfer. Physical Exam  BP (!) 145/66   Pulse 76   Temp 98 F (36.7 C) (Oral)   Resp 18   Ht 5' 1 (1.549 m)   Wt 51.7 kg   LMP  (LMP Unknown)   SpO2 97%   BMI 21.54 kg/m   Physical Exam  Procedures  Procedures  ED Course / MDM   Clinical Course as of 05/04/24 0719  Mon May 03, 2024  1107 Potassium testing came back at 7.5.  Have ordered treatment for hyperkalemia and put in a consult for nephrology. [MB]  1107 Chest x-ray with significant rotation.  No clear infiltrate.  Awaiting radiology reading. [MB]  1120 Discussed with Dr. Dennise nephrology who would work on getting the patient dialysis. [MB]    Clinical Course User Index [MB] Towana Ozell BROCKS, MD   Medical Decision Making Amount and/or Complexity of Data Reviewed Labs: ordered. Radiology: ordered.  Risk OTC drugs. Prescription drug management.   Ambulance here to take patient home.  No complaints.       Towana Ozell BROCKS, MD 05/04/24 [phone]

## 2024-05-11 LAB — SUSCEPTIBILITY, AER + ANAEROB

## 2024-05-12 ENCOUNTER — Other Ambulatory Visit: Payer: Self-pay

## 2024-05-12 ENCOUNTER — Emergency Department (HOSPITAL_COMMUNITY)

## 2024-05-12 ENCOUNTER — Encounter (HOSPITAL_COMMUNITY): Payer: Self-pay | Admitting: *Deleted

## 2024-05-12 ENCOUNTER — Emergency Department (HOSPITAL_COMMUNITY)
Admission: EM | Admit: 2024-05-12 | Discharge: 2024-05-13 | Disposition: A | Discharge status: Home / Self Care | Source: Home / Self Care | Attending: Emergency Medicine | Admitting: Emergency Medicine

## 2024-05-12 DIAGNOSIS — R011 Cardiac murmur, unspecified: Secondary | ICD-10-CM | POA: Diagnosis not present

## 2024-05-12 DIAGNOSIS — R519 Headache, unspecified: Secondary | ICD-10-CM | POA: Diagnosis present

## 2024-05-12 DIAGNOSIS — Z7982 Long term (current) use of aspirin: Secondary | ICD-10-CM | POA: Diagnosis not present

## 2024-05-12 DIAGNOSIS — R062 Wheezing: Secondary | ICD-10-CM | POA: Diagnosis not present

## 2024-05-12 LAB — RESP PANEL BY RT-PCR (RSV, FLU A&B, COVID)  RVPGX2
Influenza A by PCR: NEGATIVE
Influenza B by PCR: NEGATIVE
Resp Syncytial Virus by PCR: NEGATIVE
SARS Coronavirus 2 by RT PCR: NEGATIVE

## 2024-05-12 LAB — CBC WITH DIFFERENTIAL/PLATELET
Abs Immature Granulocytes: 0.07 K/uL (ref 0.00–0.07)
Basophils Absolute: 0.1 K/uL (ref 0.0–0.1)
Basophils Relative: 1 %
Eosinophils Absolute: 0.4 K/uL (ref 0.0–0.5)
Eosinophils Relative: 8 %
HCT: 30.9 % — ABNORMAL LOW (ref 36.0–46.0)
Hemoglobin: 9.1 g/dL — ABNORMAL LOW (ref 12.0–15.0)
Immature Granulocytes: 1 %
Lymphocytes Relative: 22 %
Lymphs Abs: 1.2 K/uL (ref 0.7–4.0)
MCH: 28.8 pg (ref 26.0–34.0)
MCHC: 29.4 g/dL — ABNORMAL LOW (ref 30.0–36.0)
MCV: 97.8 fL (ref 80.0–100.0)
Monocytes Absolute: 0.4 K/uL (ref 0.1–1.0)
Monocytes Relative: 7 %
Neutro Abs: 3.4 K/uL (ref 1.7–7.7)
Neutrophils Relative %: 61 %
Platelets: 245 K/uL (ref 150–400)
RBC: 3.16 MIL/uL — ABNORMAL LOW (ref 3.87–5.11)
RDW: 19.2 % — ABNORMAL HIGH (ref 11.5–15.5)
WBC: 5.5 K/uL (ref 4.0–10.5)
nRBC: 0.5 % — ABNORMAL HIGH (ref 0.0–0.2)

## 2024-05-12 LAB — COMPREHENSIVE METABOLIC PANEL WITH GFR
ALT: 6 U/L (ref 0–44)
AST: 16 U/L (ref 15–41)
Albumin: 3.7 g/dL (ref 3.5–5.0)
Alkaline Phosphatase: 77 U/L (ref 38–126)
Anion gap: 13 (ref 5–15)
BUN: 12 mg/dL (ref 6–20)
CO2: 27 mmol/L (ref 22–32)
Calcium: 8.9 mg/dL (ref 8.9–10.3)
Chloride: 97 mmol/L — ABNORMAL LOW (ref 98–111)
Creatinine, Ser: 1.95 mg/dL — ABNORMAL HIGH (ref 0.44–1.00)
GFR, Estimated: 30 mL/min — ABNORMAL LOW (ref >60)
Glucose, Bld: 89 mg/dL (ref 70–99)
Potassium: 3.5 mmol/L (ref 3.5–5.1)
Sodium: 137 mmol/L (ref 135–145)
Total Bilirubin: 0.3 mg/dL (ref 0.0–1.2)
Total Protein: 7.7 g/dL (ref 6.5–8.1)

## 2024-05-12 LAB — TROPONIN T, HIGH SENSITIVITY
Troponin T High Sensitivity: 78 ng/L — ABNORMAL HIGH (ref 0–19)
Troponin T High Sensitivity: 79 ng/L — ABNORMAL HIGH (ref 0–19)

## 2024-05-12 MED ORDER — IPRATROPIUM-ALBUTEROL 0.5-2.5 (3) MG/3ML IN SOLN
3.0000 mL | Freq: Once | RESPIRATORY_TRACT | Status: AC
  Administered 2024-05-12: 16:00:00 3 mL via RESPIRATORY_TRACT
  Filled 2024-05-12: qty 3

## 2024-05-12 MED ORDER — IOHEXOL 350 MG/ML SOLN
75.0000 mL | Freq: Once | INTRAVENOUS | Status: AC | PRN
  Administered 2024-05-12: 20:00:00 60 mL via INTRAVENOUS

## 2024-05-12 MED ORDER — ACETAMINOPHEN 500 MG PO TABS
1000.0000 mg | ORAL_TABLET | Freq: Once | ORAL | Status: AC
  Administered 2024-05-12: 16:00:00 1000 mg via ORAL
  Filled 2024-05-12: qty 2

## 2024-05-12 NOTE — ED Provider Notes (Signed)
 [place] EMERGENCY DEPARTMENT AT [facility] Provider Note   CSN: [id number] Arrival date & time: 05/12/24  1449     Patient presents with: Headache   [name] is a 58 y.o. female.   HPI 58 year old female presents with headache and shortness of breath.  Patient states that last night she started feeling short of breath.  She also had a headache.  She has not had a cough or chest pain.  This morning it seemed like she had wheezing and her daughter gave her an albuterol  treatment but the patient was concerned it did not work because it seemed like the nebulized material clumped up.  She also had a headache this morning that went away with Tylenol .  She went to dialysis and after dialysis she developed recurrent shortness of breath as well as a recurrent headache around 2 PM.  The headache was pretty severe on the 2 PM episode.  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  She wears 2 L of oxygen at home.  Prior to Admission medications  Medication Sig Start Date End Date Taking? Authorizing Provider  acetaminophen  (TYLENOL ) 500 MG tablet Take 1,000 mg by mouth every 6 (six) hours as needed for moderate pain.    [provider]  albuterol  (PROVENTIL ) (2.5 MG/3ML) 0.083% nebulizer solution Take 3 mLs (2.5 mg total) by nebulization every 4 (four) hours as needed for wheezing or shortness of breath. 04/15/23 05/03/24  Pearlean Manus, MD  amLODipine  (NORVASC ) 10 MG tablet Take 10 mg by mouth daily. 03/18/24   [provider]  aspirin  EC 81 MG tablet Take 1 tablet (81 mg total) by mouth daily. Swallow whole. Patient not taking: Reported on 05/03/2024 04/22/24   TatAlm, MD  B Complex-C-Folic Acid  (DIALYVITE TABLET) TABS Take 1 tablet by mouth daily. 02/27/24   [provider]  carvedilol  (COREG ) 25 MG tablet Take 25 mg by mouth 2 (two) times daily. 02/03/24   [provider]  cefTAZidime  1 g in sodium chloride  0.9 % 100 mL Inject 1 g into the vein every Monday, Wednesday, and Friday with hemodialysis. Last dose on dialysis 05/14/2024 04/21/24   TatAlm, MD  cyclobenzaprine  (FLEXERIL ) 10 MG tablet Take 10 mg by mouth 3 (three) times daily as needed for muscle spasms. 02/26/24   [provider]  Darbepoetin Alfa  (ARANESP ) 200 MCG/0.4ML SOSY injection Inject 0.4 mLs (200 mcg total) into the skin every Monday at 6 PM. 03/15/24   Ricky Fines, MD  fluconazole  (DIFLUCAN ) 100 MG tablet Take 200 mg by mouth daily. After dialysis 01/29/24   [provider]  gabapentin  (NEURONTIN ) 100 MG capsule Take 1 capsule (100 mg total) by mouth 2 (two) times daily. 03/17/24   Tobie Suzzane POUR, MD  hydrALAZINE  (APRESOLINE ) 50 MG tablet Take 50 mg by mouth 3 (three) times daily. 04/17/24   [provider]  hydrOXYzine  (ATARAX ) 10 MG tablet Take 10 mg by mouth every 8 (eight) hours as needed for itching. 01/29/24   [provider]  levETIRAcetam  (KEPPRA ) 500 MG tablet Take 500 mg by mouth 2 (two) times daily. Take Monday, Wednesday, and Friday evenings 04/23/24   [provider]  lidocaine -prilocaine  (EMLA ) cream Apply 1 Application topically 3 (three) times a week. 06/04/22   [provider]  ondansetron  (ZOFRAN ) 4 MG tablet Take 1 tablet (4 mg total) by mouth every 6 (six) hours. Patient taking differently:  Take 4 mg by mouth every 6 (six) hours as needed for nausea or vomiting. 04/27/24   Tobie Suzzane POUR, MD  oxyCODONE  (OXY IR/ROXICODONE ) 5 MG immediate release tablet Take 2.5 mg by mouth every 6 (six) hours as needed for moderate pain (pain score 4-6). 04/17/24   [provider]  pantoprazole  (PROTONIX ) 40 MG tablet Take 40 mg by mouth daily. 03/18/24   [provider]  senna-docusate (SENOKOT-S) 8.6-50 MG tablet Take 2 tablets by mouth at bedtime. 11/24/23   Pearlean Manus, MD  sevelamer  (RENAGEL ) 800 MG  tablet Take 1,600 mg by mouth 3 (three) times daily with meals.    [provider]  vancomycin  LUCRECIA) 500 MG/100ML IVPB Inject 100 mLs (500 mg total) into the vein every Monday, Wednesday, and Friday with hemodialysis. Last dose on 05/14/2024 04/21/24   Evonnie Lenis, MD    Allergies: Other, Bactoshield chg [chlorhexidine  gluconate], Cefepime , Linezolid, Moxifloxacin, Quinine derivatives, Vancomycin , Azithromycin , Benadryl [diphenhydramine hcl (sleep)], Daptomycin, Piperacillin , Tetracycline, and Zosyn  [piperacillin  sod-tazobactam so]    Review of Systems  Constitutional:  Negative for fever.  Respiratory:  Positive for shortness of breath. Negative for cough.   Cardiovascular:  Negative for chest pain.  Gastrointestinal:  Negative for abdominal pain and vomiting.  Musculoskeletal:  Negative for neck pain.  Neurological:  Positive for headaches. Negative for weakness and numbness.    Updated Vital Signs BP (!) 174/100   Pulse 75   Temp 97.7 F (36.5 C) (Oral)   Resp 19   Ht 5' 1 (1.549 m)   Wt 51.7 kg   LMP  (LMP Unknown)   SpO2 98%   BMI 21.54 kg/m   Physical Exam Vitals and nursing note reviewed.  Constitutional:      Appearance: She is well-developed.  HENT:     Head: Normocephalic and atraumatic.  Eyes:     Pupils: Pupils are equal, round, and reactive to light.  Cardiovascular:     Rate and Rhythm: Normal rate and regular rhythm.     Heart sounds: Murmur heard.  Pulmonary:     Effort: Pulmonary effort is normal. No tachypnea, accessory muscle usage or respiratory distress.     Breath sounds: Rales present. No wheezing or rhonchi.     Comments: Mild basilar rales Abdominal:     Palpations: Abdomen is soft.     Tenderness: There is no abdominal tenderness.  Musculoskeletal:     Cervical back: No rigidity.  Skin:    General: Skin is warm and dry.  Neurological:     Mental Status: She is alert.     Comments: No facial droop or slurred speech. 5/5  strength in RUE, mild weakness in LUE. Both legs are paralyzed.     (all labs ordered are listed, but only abnormal results are displayed) Labs Reviewed  COMPREHENSIVE METABOLIC PANEL WITH GFR - Abnormal; Notable for the following components:      Result Value   Chloride 97 (*)    Creatinine, Ser 1.95 (*)    GFR, Estimated 30 (*)    All other components within normal limits  CBC WITH DIFFERENTIAL/PLATELET - Abnormal; Notable for the following components:   RBC 3.16 (*)    Hemoglobin 9.1 (*)    HCT 30.9 (*)    MCHC 29.4 (*)    RDW 19.2 (*)    nRBC 0.5 (*)    All other components within normal limits  TROPONIN T, HIGH SENSITIVITY - Abnormal; Notable for the following components:  Troponin T High Sensitivity 78 (*)    All other components within normal limits  TROPONIN T, HIGH SENSITIVITY - Abnormal; Notable for the following components:   Troponin T High Sensitivity 79 (*)    All other components within normal limits  RESP PANEL BY RT-PCR (RSV, FLU A&B, COVID)  [id number]    EKG: EKG Interpretation Date/Time:  Wednesday May 12 2024 16:24:48 EST Ventricular Rate:  75 PR Interval:  148 QRS Duration:  124 QT Interval:  460 QTC Calculation: 514 R Axis:   -53  Text Interpretation: Sinus rhythm Left atrial enlargement RBBB and LAFB similar to May 03 2024 Confirmed by Freddi Hamilton [phone]) on 05/12/2024 5:26:15 PM  Radiology: CT Angio Chest PE W and/or Wo Contrast Result Date: 05/12/2024 EXAM: CTA CHEST 05/12/2024 07:45:50 PM TECHNIQUE: CTA of the chest was performed after the administration of 60 mL of iohexol  (OMNIPAQUE ) 350 MG/ML injection. Multiplanar reformatted images are provided for review. MIP images are provided for review. Automated exposure control, iterative reconstruction, and/or weight based adjustment of the mA/kV was utilized to reduce the radiation dose to as low as reasonably achievable. COMPARISON: 04/19/2024 CLINICAL HISTORY: Dizziness for 2 days. FINDINGS:  PULMONARY ARTERIES: The pulmonary artery is prominent. No acute pulmonary embolus. Main pulmonary artery is normal in caliber. MEDIASTINUM: The heart is enlarged in size. No pericardial effusion is noted. Atherosclerotic calcifications of the aorta are seen without aneurysmal dilatation or dissection. Thoracic inlet is within normal limits. Esophagus as visualized is unremarkable. Paraspinal is within normal limits. LYMPH NODES: No mediastinal, hilar or axillary lymphadenopathy. LUNGS AND PLEURA: The lungs are well aerated with bilateral lower lobe consolidation. No Sizable parenchymal nodules are seen. New loculated pleural effusion is noted along the medial aspect of the right upper lobe adjacent to the mediastinum. Small right pleural effusion is noted with extension into the major fissure and minor fissure. No pneumothorax. UPPER ABDOMEN: Limited images of the upper abdomen are unremarkable. SOFT TISSUES AND BONES: Changes of prior surgery within the mid thoracic spine. Stable compression is noted at T7 with anterolisthesis of T6 with respect to T7. No acute soft tissue abnormality. IMPRESSION: 1. No evidence of pulmonary embolism. 2. Bilateral lower lobe consolidation. 3. New loculated right upper lobe pleural effusion adjacent to the mediastinum, with small right pleural effusion and fissural extension. Electronically signed by: Oneil Devonshire MD 05/12/2024 08:05 PM EST RP Workstation: [id number]   CT Head Wo Contrast Result Date: 05/12/2024 EXAM: CT HEAD WITHOUT CONTRAST 05/12/2024 04:58:20 PM TECHNIQUE: CT of the head was performed without the administration of intravenous contrast. Automated exposure control, iterative reconstruction, and/or weight based adjustment of the mA/kV was utilized to reduce the radiation dose to as low as reasonably achievable. COMPARISON: 04/19/2024 CLINICAL HISTORY: Headache, new onset (Age >= 51y) FINDINGS: BRAIN AND VENTRICLES: No acute hemorrhage. No evidence of acute  infarct. 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 Mild cerebral atrophy. Unchanged patchy hypodensities in bilateral cerebral white matter, nonspecific but compatible with mild chronic small vessel ischemic disease. Similar to multiple prior examinations, the degree of ventricular dilation appears disproportionate to the sulci, there is relative crowding of the vertex, the callosal angle is acute (approximately 62 degrees) and the corpus callosum itself appears bowed upward. Clinical correlation for signs and symptoms of normal pressure hydrocephalus may be helpful for further management. No extra-axial collection. No mass effect or midline shift. ORBITS: No acute abnormality. SINUSES: No acute abnormality. SOFT TISSUES AND SKULL: No  acute soft tissue abnormality. No skull fracture. IMPRESSION: 1. No acute intracranial abnormality. 2. Findings suggestive of normal pressure hydrocephalus; recommend assessment for signs and symptoms for further management. 3. 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 4. Mild cerebral atrophy and patchy hypodensities in the bilateral cerebral white matter, compatible with mild chronic small vessel ischemic disease. Electronically signed by: Dorethia Molt MD 05/12/2024 05:51 PM EST RP Workstation: [id number]   DG Chest Portable 1 View Result Date: 05/12/2024 EXAM: 1 VIEW(S) XRAY OF THE CHEST 05/12/2024 04:12:00 PM COMPARISON: XR Chest dated 05/03/2024. CLINICAL HISTORY: shortness of breath FINDINGS: LUNGS AND PLEURA: Small pleural effusions are present. There is retrocardiac airspace disease, unchanged. No pneumothorax. HEART AND MEDIASTINUM: The heart is enlarged. There is central pulmonary vascular congestion. BONES AND SOFT TISSUES: Thoracic spinal fusion hardware is again seen. IMPRESSION: 1. Cardiomegaly with central pulmonary vascular congestion and small pleural  effusions. 2. Unchanged retrocardiac airspace disease. Electronically signed by: Greig Pique MD 05/12/2024 04:16 PM EST RP Workstation: [id number]     Procedures   Medications Ordered in the ED  ipratropium-albuterol  (DUONEB) 0.5-2.5 (3) MG/3ML nebulizer solution 3 mL (3 mLs Nebulization Given 05/12/24 1617)  acetaminophen  (TYLENOL ) tablet 1,000 mg (1,000 mg Oral Given 05/12/24 1614)  iohexol  (OMNIPAQUE ) 350 MG/ML injection 75 mL (60 mLs Intravenous Contrast Given 05/12/24 1933)                                    Medical Decision Making Amount and/or Complexity of Data Reviewed External Data Reviewed: notes. Labs: ordered.    Details: Mildly elevated troponins, flat Radiology: ordered and independent interpretation performed.    Details: No head bleed, no PE ECG/medicine tests: ordered and independent interpretation performed.    Details: No ischemia  Risk OTC drugs. Prescription drug management.   Patient presents with shortness of breath and headache.  Exam is unremarkable besides some crackles at the bases.  She wears oxygen at home so she is not having a new oxygen requirement.  Was given a DuoNeb.  Headache is better with Tylenol .  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  Low suspicion for stroke, venous sinus thrombosis, infection, etc.  Patient's lung findings seem similar to 12/8.  She is not having cough, fever, leukocytosis so I think acute pneumonia is unlikely.  She is currently on vancomycin  and cefepime  from what I can tell in the chart.  I do not think changes are needed.  She is not hypoxic and is not having any shortness of breath at this time.  Will refer her back to her PCP and infectious disease doctor.  She feels well enough for discharge.     Final diagnoses:  Nonintractable headache, unspecified chronicity pattern, unspecified headache type    ED Discharge Orders           Ordered    Ambulatory referral to Neurology       Comments: An appointment is requested in approximately: 2 weeks   05/12/24 2053               Freddi Hamilton, MD 05/12/24 2209

## 2024-05-12 NOTE — ED Triage Notes (Signed)
 Pt BIB [facility] from dialysis for c/o headache and dizziness that started last night, pt's O2 sats 88% on RA; ems applied O2 at 2l via [place] and sats increased to 99%  Pt states she has had some wheezing

## 2024-05-12 NOTE — Discharge Instructions (Signed)
 Is unclear what was causing your shortness of breath earlier today.  Your headache will need to be followed up with neurology.  Your CT scan showed the possibility of normal pressure hydrocephalus, which can be evaluated as an outpatient from neurology.  If you develop new or worsening headache, shortness of breath, cough, fever, chest pain, or any other new/concerning symptoms then return to the ER.

## 2024-05-12 NOTE — Telephone Encounter (Signed)
 Patient requires OPAT weekly safety labs for long term IV or oral antibiotics.   Outreach to [facility] for weekly safety OPAT labs. Transferred to voicemail, but received message that mailbox is full.

## 2024-05-12 NOTE — ED Notes (Signed)
 Pt reports she was wheezing last night and this morning.  Pt feels much better after dialysis, but has a headache.  Pt is 98% on her baseline 3L [place].

## 2024-05-13 ENCOUNTER — Other Ambulatory Visit: Payer: Self-pay | Admitting: Family Medicine

## 2024-05-18 ENCOUNTER — Ambulatory Visit (INDEPENDENT_AMBULATORY_CARE_PROVIDER_SITE_OTHER)

## 2024-05-18 VITALS — BP 156/78 | HR 68 | Resp 16 | Ht 61.0 in

## 2024-05-18 DIAGNOSIS — Z9981 Dependence on supplemental oxygen: Secondary | ICD-10-CM | POA: Diagnosis not present

## 2024-05-18 DIAGNOSIS — J9611 Chronic respiratory failure with hypoxia: Secondary | ICD-10-CM | POA: Diagnosis not present

## 2024-05-18 NOTE — Progress Notes (Signed)
 "  Established Patient Office Visit  Subjective   Patient ID: [name], female    DOB: Dec 04, 1965  Age: 58 y.o. MRN: [id number]  Chief Complaint  Patient presents with   Medical Management of Chronic Issues    Follow up    Shortness of Breath    Pt daughter concerned with pts oxygen. States she often gets out of breath. Feels like she may need to be on constant o2 flow    Headache    Pt states she has been getting frequent headaches for last couple weeks. Are getting more frequent     HPI Discussed the use of AI scribe software for clinical note transcription with the patient, who gave verbal consent to proceed.  History of Present Illness    [name] is a 58 year old female who presents with the need for continuous oxygen therapy. She is accompanied by a family member.  Hypoxemia and oxygen therapy - Continuous oxygen therapy required at home and during dialysis for the past two weeks. - Oxygen therapy initiated after low oxygen saturation was identified during a recent infectious disease consultation. - Oxygen saturation improves with oxygen use. - No portable oxygen unit available, so oxygen is not used during short errands.  Dyspnea - Episodes of gasping for air and difficulty breathing occur, causing concern for her family. - No lower extremity swelling recently.  Cardiac concerns - Patient informed that her heart is 'working too hard' based on findings from a recent chest x-ray.      ROS    Objective:     BP (!) 156/78   Pulse 68   Resp 16   Ht 5' 1 (1.549 m)   LMP  (LMP Unknown)   SpO2 91%   BMI 21.54 kg/m  BP Readings from Last 3 Encounters:  05/18/24 (!) 156/78  05/12/24 (!) 164/80  05/04/24 (!) 145/66   Wt Readings from Last 3 Encounters:  05/12/24 113 lb 15.7 oz (51.7 kg)  05/03/24 113 lb 15.7 oz (51.7 kg)  04/19/24 124 lb 11.2 oz (56.6 kg)     Physical Exam Vitals and nursing note reviewed.  Constitutional:       Appearance: Normal appearance.  HENT:     Head: Normocephalic.  Eyes:     Extraocular Movements: Extraocular movements intact.     Pupils: Pupils are equal, round, and reactive to light.  Cardiovascular:     Rate and Rhythm: Normal rate and regular rhythm.  Pulmonary:     Effort: Pulmonary effort is normal.     Breath sounds: Normal breath sounds.  Musculoskeletal:     Cervical back: Normal range of motion and neck supple.  Neurological:     Mental Status: She is alert and oriented to person, place, and time.  Psychiatric:        Mood and Affect: Mood normal.        Thought Content: Thought content normal.      No results found for any visits on 05/18/24.    The ASCVD Risk score (Arnett DK, et al., 2019) failed to calculate for the following reasons:   Risk score cannot be calculated because patient has a medical history suggesting prior/existing ASCVD   * - Cholesterol units were assumed    Assessment & Plan:   Problem List Items Addressed This Visit       Respiratory   Chronic hypoxic respiratory failure, on home oxygen therapy (HCC) - Primary   Uses continuous  oxygen therapy while at home, but feels that she needs portable oxygen due to low oxygen saturation.  O2 sat on room air in office was 90%.  - Referred to pulmonary specialist for evaluation of further oxygen needs.        Relevant Orders   Ambulatory referral to Pulmonology    Return in about 6 months (around 11/16/2024) for chronic follow-up with PCP.    Leita Longs, FNP  "

## 2024-05-20 DIAGNOSIS — Z9981 Dependence on supplemental oxygen: Secondary | ICD-10-CM | POA: Insufficient documentation

## 2024-05-20 NOTE — Assessment & Plan Note (Signed)
 Uses continuous oxygen therapy while at home, but feels that she needs portable oxygen due to low oxygen saturation.  O2 sat on room air in office was 90%.  - Referred to pulmonary specialist for evaluation of further oxygen needs.

## 2024-05-21 ENCOUNTER — Other Ambulatory Visit: Payer: Self-pay | Admitting: Family Medicine

## 2024-05-21 ENCOUNTER — Telehealth: Payer: Self-pay | Admitting: Family Medicine

## 2024-05-21 NOTE — Telephone Encounter (Signed)
 kindly encourage to follow up with her prescribing provider in [facility]

## 2024-05-21 NOTE — Telephone Encounter (Signed)
 Pt needs refill on her meds   oxyCODONE  (OXY IR/ROXICODONE ) 5 MG immediate release table

## 2024-05-26 NOTE — Progress Notes (Signed)
 [facility]  WOUND CARE CONSULTATION REPORT - OUTPATIENT CLINIC     Referred by: Dallie Heather Ee, MD     Chief Complaint:  Trochanteric pressure wound     Background obtained on 12/15/2018:   [name] is a 58 y.o.  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 Sores been there for several months.  She is getting wound care from her daughter via dry dressing changes. She has a Roho wheelchair  seat as well as a hospital bed. She currently does not have an air mattress. She has two ulcers on her right foot as well. She has been using a moisturizer on this area. She reports she has a high protein diet.    Reviewed notes:   6/18 - Decubitus ulcer. No signs of infection. Recommended offloading. Referred here.   11/24/18 - saw PCP about ischial wound. Referred here. 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 Hold on ABI for now. If no improvement, will check.   Continue multipodus boot.    Check labs with dialysis.   Discussed high protein diet and offloading.    01/05/2019:  Patient returns for follow-up.  She had some nutrition labs done at dialysis there were reasonable.  She is using her Roho cushion.  However she has to go need to sit at least 4 hours a day at at dialysis  The  area is getting unavoidable pressure injury to the right ischium therefore she has poor chances of healing.  Discussed with patient Roho cushion is considered to be a prevention mechanism rather than treatment.    02/15/2021:  Patient returns for follow-up.  No fevers no chest pain  Has a new ulceration on left buttock.  Continues to sit for hours at dialysis 3 times a week.  Was finally seen in wheelchair seating clinic and is getting fitted for a new custom wheelchair    03/30/2019:  Continues to get unavoidable pressure injury during dialysis.  She did not have a chance to go to PT for wheelchair fitting.  She reports ulceration of the left hip is better but the sacrum is still persistent.  She thinks that they may be a little better.  She is trying to pay attention to her nutrition    05/25/2019:  Patient doing well wounds are unchanged.  She is working with [employer] to get a wheelchair.  Hopefully to get this by the end of January 2021    06/22/2019:  Patient reports the left ischium is healed.  Making progress with the right.  Still waiting on highly customized pressure offloading wheelchair cushion    07/22/19: Left ischium remains healed. Progressing on the right. No complaints. Using silver  alginate. Wearing bilateral multipodus boots, no  wounds on heels.    08/26/19:  Left ischium remains healed.   Wearing bilateral multipodus boots, no wounds on heels.   Does not know where new toe wound could have came from.   Using silver  alginate on ischium.   -Slowly progressing right ischium ulceration with ongoing unavoidable pressure injury due to sitting for hemodialysis.    09/30/2019 patient has a customized wheelchair for the past couple months.  Reports that the right buttock ulceration is getting better.  She gets silver  alginate changed daily    11/09/2019 patient continues to use Roho cushion.  She is offloading properly.  She has no concerns.  Family members are doing her  dressing  changes.    02/08/2020:  Patient has been using silver  alginate packing, and the daughter has been helping with dressing changes. She has no complaints, and wound continues to improve.    03/08/20: palpable right ischial bone excised and sent for culture         Wound culture ID  2+ Corynebacterium striatum Abnormal                2+ Peptoniphilus Abnormal                      04/18/20: Wound is contracting. No complaints.     02/08/21: Has been sitting upright for hours at dialysis. Bilateral ischial wounds have reopened.   - Recommend offloading and lying flat during dialysis.  - Begin Dakins packing to bilateral ischial wounds BID. Daughter will assist.     03/22/2021:  4 weeks went by  Finally got the Anasept, but has not started packing yet  No other new concerns    MUST begin packing anasept wtc bid  Pt says her daughter can do it.       04/05/2021:  Patient is not getting the wound packed.  Her daughter unable to do it.  Was seen by ID and IV antibiotic regimen started per cultures 10/13    04/26/2021  Patient's daughter has been consistently packing her wounds.     05/17/2021:   Patient's daughter performs dressing changes for patient per patient once - twice daily. Patient denies any fevers, chills, and/or s/s of infection.    07/12/2021:  Patient lost to follow-up for the past 2 months.  Amazingly exactly same discussions as 6 months ago.  She has nobody to do her dressing changes.  She is not putting any dressings on.      Still difficulty with actually doing the wound care.  Patient unable to rely on family members.  Suggested to consider with primary care physician nursing home options for home care nurses.  Even if she has home care nurses 3 times a week wound care regimen is substandard.  The main issue continues to be offloading and pressure.  She continues to get reinjury traveling for appointments and with dialysis sessions.     Palliative/conservative measures from wound care perspective.    DATE OF SERVICE: 09/11/2021  DATE OF OPERATION:  September 11, 2021.     PREOPERATIVE DIAGNOSIS:  Right ischial posterior thigh pressure ulcer.     POSTOPERATIVE DIAGNOSIS:  Right ischial posterior thigh pressure ulcer.     PROCEDURES:     1. Excision of right ischium including skin, subcutaneous tissue, muscle and fascia, 3 x 2 cm x 2.5     2. Excision of right posterior thigh wound including skin and subcutaneous tissue, 5 x 2.5 x 3 cm.    08/09/2021:  Patient's daughter  is doing once daily dressing changes.  The wound on the right ischium is larger    09/20/2021 58 yo paraplegic s/p debridement of rt ischium and rt posterior thigh pressure sores 09/11/21.Wounds are clean ; beginning to granulate  , No infection Minimal drainage . Vac removed and reapplied    10/11/2021:  Patient here accompanied by her daughter.  Unable to get home care nurses  Daughter is trying to keep the wound VAC in place but is concerned about poor progress.  The wound is developed significant odor increasing drainage.  Patient's daughter showing concerns about the patient's ability to retain and follow instructions.  She continues to use wheelchair for multiple appointments and sitting multiple hours at dialysis.    Finished antibiotics but not sure what kind and how long ago.  Possible a week ago.  In review of the chart noticed after debridement done in clinic in March patient becomes septic and sounds positive cultures with MRSA.  Subsequently she had or debridement but I do not see a culture specimen from OR.      MR PELVIS WITH AND WITHOUT CONTRAST, 10/19/2021   CONCLUSION:   1.  Stage IV right gluteal decubitus ulcer with underlying osteomyelitis of the right ischial tuberosity.  2.  Subcutaneous abscess with surrounding cellulitis in the inferolateral aspect of the left buttock.  3.  Fluid collections concerning for intramuscular abscesses in  the right gluteus medius and minimus muscles.  4.  Sterility indeterminate midline subcutaneous cystic lesion abutting the lower sacrococcygeal remnant. This sacrococcygeal remnant demonstrates nonspecific enhancement, which may be reactive to the adjacent collection, however early osteomyelitis  could have a similar appearance.  5.  Left gluteal decubitus ulcer extending toward the left ischial tuberosity without evidence of underlying osteomyelitis.    11/01/2021:  Status post hospitalization for right ischium osteomyelitis  She is currently on IV antibiotics nursing home  However she must continue to sit in a wheelchair to travel for dialysis appointments  She did not call her insurance to find out whether stretcher transportation is available.  She says a stretcher cannot be accommodated at dialysis unit.  She is getting protein supplements  She does have a pressure-relief surface  Getting wet-to-dry dressings her wound VAC was stopped unsure why    11/15/2021:  Had a number of things have happened since last visit.  Received information that nursing home did not want to apply the wound VAC.  Possible issue was the branding of the wound VAC not want to use [employer] machine order could not.  There was a question of who is the wound care provider  Eventually we received request to approve [employer] wound VAC which we did  Patient has been getting this change at the facility but she is informing me today that she will be discharging a week or 2.  He is not sure without the wound VAC will be continued or if she has home care nurses or was going to happen  Patient continues to sit in the wound going to dialysis  Still on antibiotics    Tremendous burden of case management issues.  Possible ongoing unavoidable pressure injury.      12/27/2021  Patient is currently home.  She has home health agency and continues to get negative pressure wound therapy treatment.  Closely followed by ID and her  antibiotic IV's course was extended through the end of the month  Repeat MRI raise concern for a fluid structure over the sacrum and  coccyx.  There are plans to have this drained with the help by IR.      MR PELVIS WITH AND WITHOUT CONTRAST, 12/20/2021 4:57 PM  CONCLUSION:   1.  Improved osteomyelitis of the right ischial tuberosity underlying the gluteal decubitus ulcer with mild residual osseous and adjacent soft tissue enhancement. No findings to suggest acute/active osteomyelitis.  2.  Left gluteal decubitus ulcer without osseous changes of the underlying ischial tuberosity to suggest osteomyelitis.  3.  Thinning of the skin over the left greater trochanter which is more conspicuous compared to prior without underlying osseous changes to suggest osteomyelitis.  4.  Redemonstrated fluid structure within the soft tissues superficial to the lower sacrum and coccyx extending into the infra-coccygeal soft tissues which measures up to 7.8 cm, similar in size with slightly different morphology likely related to positioning,  which may represent an adventitial bursa. No surrounding inflammatory changes.  5.  Resolution of the fluid collection centered in the left gluteal subcutaneous soft tissues with mild residual ill-defined enhancement in this region and resolution of the right gluteus minimus and medius fluid collections.                The wound was reconfigured on the right ischium to allow better placement of the wound VAC.    01/17/2022:  Patient doing well with wound VAC.  Size of the wound appears at least 30% smaller compared to postdebridement measurements from last appointment.  Saw ID couple days ago antibiotics prolonged total 12 weeks.  Possible MRI to be considered later    02/14/2022:  Patient's left ischium wound is stalled.  She has a new ulceration right ischium is rather large  Patient reports she is offloading but she continues to use the wheelchair transportation to doctors  appointments sitting on a stretcher for dialysis.    Left ischium is stalled.  Significant pressure injury noticed now on the right.  I am really not sure her nutrition is adequate.   Patient seems to be depressed and possible protein malnourished.  She is requesting conservative type of care.  She would like to stop the wound VAC and have her daughter packed the wounds once a day with bleach solution.  I shared with patient my concern for ongoing poor progress poor prognostic and future infections complication in this context    03/07/2022:  Unfortunately right ischium has gotten significantly more complicated and turned into a very large ulceration with purulence  She was admitted any pain.  Treated with antibiotics and discharged back home.  She is back in a wheelchair sitting multiple hours a day  Patient accompanied by daughter today.  Patient's daughter was very surprised to find out that a month ago patient requested to stop the wound VAC    Same problems and I have same concerns   in spite of patient reporting compliance there is clearly ongoing pressure shearing is causing injury to her bottom.  Depression  Poor nutrition    I think the only hope for her to improve is institutionalization placement where she can be adamantly be offloaded.     Discussed different treatment options.  With her daughter's insistence patient agreed to restart negative pressure wound therapy.  We will initiate this orders  However I have clearly expressed my concern that just the wound VAC will not be sufficient unless compliance with pressure offloading will improve.    04/04/2022:  Patient returns with her daughter for follow-up.  She is adamant about  doing wound cares and about getting protein shakes.  Patient is still resistant sometimes but she is cooperative  No new concerns today.  Wounds are improved  Unfortunately only the hip area is addressed with the wound VAC and the ischium with   dressings due to not having the Y connector yet    05/16/2022   Per measurements left ischium wound is much much smaller  Daughter states it's gotten difficult to place sponge for vac    Clinically improved.  This is clearly due to patient's daughter's efforts  Wound now so much smaller in size will hold off on NPWT    06/27/2022:  Left ischium is healed.  Right wound no significant change  Patient in a wheelchair.  Still has to travel for transportation to dialysis 2-3 times a week  Continues to have signs of pressure, rolled hyperkeratotic edges  Picture below after excision          Left ischium healed but right ischium still showing signs of pressure.  She clearly prefers to continue wet-to-dry twice a day.  Her daughter is not here today and again I have the sense that patient has a preference to continue with conservative type of treatments   08/08/22 Since last visit, patient has been packing the right ischium and it is improving is getting smaller. However she has a significant recurrence with very large ulceration on the left ischium.  She is trying to pack to see the Patient continues to get pressure injury from sitting in the chair going to dialysis sessions.   09/19/2022  No specific complaints Does have VAC for left   10/31/2022: Patient returns for follow-up.  Continues to use wound VAC on left ischium/trochanter. However she reports overall not doing well she is currently fighting a bronchitis. Also she is losing weight for unclear reasons. It is unclear how much weight she lost but looks like at least 2030 pounds in the past 2 months. Patient has been on the wound VAC treatment for about 4 to 6 months.  No clinical progress noticed   I am concerned patient is dealing with failure to thrive for unclear reasons and she is in a catabolic state. From wound perspective I think the biggest concern is inability for adequate offloading which has not changed in the past 5  to 7 years   12/26/2022: Patient seen last time almost 2 months ago. She was hospitalized with osteomyelitis and soft tissue infection purulence [facility] then [facility] to [facility] Patient continues to lose weight. She also had retroperitoneal bleed. Ortho and plastic surgery consults were done and she is not a surgical candidate due to her medical issues. Currently on IV antibiotics but they are mostly empiric. Patient reports very poor p.o. intake.  She reports gagging and poor appetite.  She had GI evaluation and endoscopies and no problems diagnosed She has a very large visible hematoma on the right flank and also anterior right groin.  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 Briefly, Ms. Delphia Marolf is a 58 year old female with PMH of HTN, paraplegia, seizure disorder, ESRD on HD MWF, suprapubic catheter, GERD, chronic pain, decubitus ulcer who presented as a transfer from [facility] due to sepsis with etiology concerning for SSTI and acute on chronic osteomyelitis of left femoral trochanter.  Her course was complicated by acute on chronic blood loss anemia in the setting of retroperitoneal hematoma.  The rest of this discharge summary will be discussed in problem based format.   #Sepsis 2/2 SSTI #Chronic left trochanter osteomyelitis #Right hip  effusion #Stage IV sacral decubitis ulcer Patient had initially presented outside hospital [facility]) with a chief complaint of fever and diarrhea.  She was found to have malodorous decubitus ulcers.  She was febrile initial presentation outside hospital on 102.34F, tachycardic to 106 and tachypneic to 24 thus meeting SIRS criteria for sepsis.  CT and MRI were done which was concerning for possible right hip septic arthritis as well as acute versus chronic left femoral greater trochanter osteomyelitis.  ID was consulted there and she was started on Vanco and Rocephin .  Orthopedics and plastics were consulted, however, there was no coverage at the time thus necessitating the transfer to [facility].  She had her right hip aspirated by IR at the outside hospital.  Culture from this aspirate did not grow anything thus making septic arthritis unlikely.  Orthopedics and plastics were consulted upon arrival to our facility.  Orthopedic surgery recommended no acute interventions once it was verified that there is no bacteria in the hip aspirate.  Plastics was consulted for consideration of a flap.  Due to patient's poor nutrition and intense muscle spasms, was determined she is not a candidate for flap coverage at this time.  ID was consulted and recommended continuing Vancomycin  and Rocephin  while inpatient with transition to vancomycin  and cefepime  with HD at discharge for a total 2-week course (EOT 12/27/22).  Had initially ordered a repeat MRI pelvis given the lower quality of 1 obtained at outside hospital, however, on discussion with surgical teams and infectious disease it was determined that this scan was not needed to help determine further management. Plastic surgery to arrange outpatient follow up. Discussed with orthopedic surgery on day of discharge and they did not need outpatient follow up at this time.   #Acute on chronic blood loss anemia #Retroperitoneal hematoma Patient with baseline hemoglobin around 9  in the setting of known anemia of chronic disease secondary to her ESRD.  On arrival to our facility, patient's hemoglobin was 5.6, down from her baseline which she was at 2 days prior.  She had developed bruising over her right flank and thus we obtained a CTA abdomen/pelvis which was notable for large 14.5 x 11.3 x 16.66 cm hematoma in the right flank extending into the lateral abdominal wall musculature.  It did not show any active extravasation.  Patient received 2 units pRBCs with subsequent stability of hemoglobin.  Suspect the cause for acute hemoglobin drop was this RP hematoma.  Patient was hemodynamically stable throughout her hospitalization.   #ESRD on HD MWF #Hyperkalemia #Hypomagnesemia #Hypophosphatemia Nephrology was consulted on admission and managed hemodialysis while patient was hospitalized.  She had some electrolyte abnormalities, including hypokalemia, hypomagnesemia, hypophosphatemia.  Hyperkalemia resolved with one-time dose of Lokelma  as well as HD.  Patient underwent routine HD during this admission and never required urgent HD.  Unfortunately the same problem persist: Failure to thrive catabolic state unavoidable pressure injury. It is possible this things are related to extensive chronic pelvis osteomyelitis. But she has spontaneous bleeds quite large without clear cause and ongoing weight loss Definitely not a surgical candidate. I am concerned her prognosis is very guarded.   02/06/2023: Patient reports wounds are stable.  She is trying to pay attention to proteins intake. Using wet-to-dry twice a day. Currently dealing with some diarrhea. Noticed significant foot swelling.  There is obvious blood was noticed on plantar and dorsal aspect of the foot but she does not remember any trauma.  It is possible this could have happened while she is turning to the bed and touching the railings   03/20/2023 58 yo F , H/o paraplegia, HTN, Seizures, TBI. ESRD, R buttock  pressure sore stage 4 EXAM :  Wound is less deep ,smaller with healthy granulation.  No exposed bone PLAN :  betadine , Ag alginate daily   04/17/2023: Patient is now getting silver  alginate and Betadine paint 3 times a week with assistance of home care nurses She went to the wheelchair clinic but patient requested to have a manual wheelchair.   Unfortunately some misunderstanding because my request was to have her current wheelchair pressure map make sure he is optimized from this perspective     06/05/2023: 58 yo F N/p Paraplegia, HTN, ESRD, TBI, Seizures. EXAM : R ischium has no exposed bone . No infection , Red granulation with biofilm that wipes away.    L Ischium has red granulation No infection . No exposed bone.  PLAN : Daily Betadine paint, Ag alginate   07/24/2023: Patient returns for follow-up.  She reports she continues to lose weight.  She is unsure why. This is happening spite of patient's perception of having enough nutrition intake. Noticed ER admission and then to hospital admission for respiratory failure and acute pulmonary edema since last appointment. Patient continues to be mostly on her own.  She notices worsening contractions.  She gets stretching and therapy sometimes   Patient continues to have failure to thrive.  I am concerned that she does not have any biological reserve for wound healing. Worsening is expected   10/23/2023: 58 yo F H/o paraplegia, TBI, HTN, Seizures, Dialysis, stage 4 decubiti, Chronic OM pelvis , Failure to thrive with weight loss.  EXAM: L ischial wound   has granulation over bone . Minimal drainage . Tunnels 1- 1.5 cm a from 1 - 5 o'clock .  R ischial wound is small , granulation covering bone . No tunneling . PLAN:  Daily antibacterial soap wash, Betadine paint . Offload.  >> dietary protein.     12/02/23 Seen by Dr Recardo 5/29 L ischial wound   has granulation over bone . Minimal drainage . Tunnels 1- 1.5 cm a from 1 - 5 o'clock .  R ischial  wound is small , granulation covering bone . No tunneling . PLAN:  Daily antibacterial soap wash, Betadine paint . Offload.  >> dietary protein. Has bilateral ischial wounds and new wound right buttock   We will submit a request to your insurance for a wound vac for left ischium wound as well as a referral for home health nursing    12/16/2023: I have seen patient personally last time in February.  She has been hospitalized 3 times since then with encephalopathy respiratory failure Hx.  Worsening of the wounds were noticed.  She has been using a wound VAC  for the right ischium. The daughter is trying to keep a seal.  No home care nurses yet. She has wound on the right ischium but now she is developing a new area of tissue damage: Left lateral ischium/trochanter.  Patient continues to have difficulty complying with offloading.  She continues to sit many hours traveling to and from dialysis and also she sits in the bed at home   Unfortunately patient continues to have worsening wounds.   I still do not think that she does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

## 2024-05-26 NOTE — Progress Notes (Signed)
 Dressing applied per providers orders.  Patient tolerated procedure well.  Patient given AVS.  Packing placed: No.

## 2024-05-29 ENCOUNTER — Other Ambulatory Visit: Payer: Self-pay

## 2024-05-29 ENCOUNTER — Emergency Department (HOSPITAL_COMMUNITY)
Admission: EM | Admit: 2024-05-29 | Discharge: 2024-05-29 | Disposition: A | Discharge status: Home / Self Care | Source: Home / Self Care | Attending: Emergency Medicine | Admitting: Emergency Medicine

## 2024-05-29 DIAGNOSIS — T83010A Breakdown (mechanical) of cystostomy catheter, initial encounter: Secondary | ICD-10-CM | POA: Insufficient documentation

## 2024-05-29 DIAGNOSIS — Y738 Miscellaneous gastroenterology and urology devices associated with adverse incidents, not elsewhere classified: Secondary | ICD-10-CM | POA: Insufficient documentation

## 2024-05-29 NOTE — Discharge Instructions (Signed)
 Follow-up with primary care doctor for any continued concerns.  Return to ED if any symptoms worsen including severe abdominal pain, blood in the urine, increased leaking urine.

## 2024-05-29 NOTE — ED Notes (Signed)
 Pt placed on list for convalescent transport home

## 2024-05-29 NOTE — ED Provider Notes (Signed)
 " [place] EMERGENCY DEPARTMENT AT [facility] Provider Note   CSN: [id number] Arrival date & time: 05/29/24  1617     Patient presents with: Foley Replacement   [name] is a 59 y.o. female.  Patient is a 59 year old female with a history of paraplegia and chronic suprapubic catheter who presents to the ED via EMS for potentially displaced catheter.  Patient notes she feels a bulb and had leaking this morning when she woke up.  Notes catheter was replaced yesterday by home health.  States the catheter is replaced once a month that she has had for a long time.  Denies fevers or any increased pain around the site.  No further complaints.   HPI     Prior to Admission medications  Medication Sig Start Date End Date Taking? Authorizing Provider  acetaminophen  (TYLENOL ) 500 MG tablet Take 1,000 mg by mouth every 6 (six) hours as needed for moderate pain.    [provider]  albuterol  (PROVENTIL ) (2.5 MG/3ML) 0.083% nebulizer solution Take 3 mLs (2.5 mg total) by nebulization every 4 (four) hours as needed for wheezing or shortness of breath. 04/15/23 05/18/24  Pearlean Manus, MD  amLODipine  (NORVASC ) 10 MG tablet Take 10 mg by mouth daily. 03/18/24   [provider]  B Complex-C-Folic Acid  (DIALYVITE TABLET) TABS Take 1 tablet by mouth daily. 02/27/24   [provider]  carvedilol  (COREG ) 25 MG tablet Take 25 mg by mouth 2 (two) times daily. 02/03/24   [provider]  cefTAZidime  1 g in sodium chloride  0.9 % 100 mL Inject 1 g into the vein every Monday, Wednesday, and Friday with hemodialysis. Last dose on dialysis 05/14/2024 04/21/24   TatAlm, MD  cyclobenzaprine  (FLEXERIL ) 10 MG tablet Take 10 mg by mouth 3 (three) times daily as needed for muscle spasms. 02/26/24   [provider]  Darbepoetin Alfa  (ARANESP ) 200 MCG/0.4ML SOSY injection Inject 0.4 mLs (200 mcg total) into the skin every Monday at 6 PM. 03/15/24   Ricky Fines, MD  fluconazole  (DIFLUCAN ) 100 MG tablet Take 200 mg by mouth daily. After dialysis 01/29/24   [provider]  gabapentin  (NEURONTIN ) 100 MG capsule Take 1 capsule (100 mg total) by mouth 2 (two) times daily. 03/17/24   Tobie Suzzane POUR, MD  hydrALAZINE  (APRESOLINE ) 50 MG tablet Take 50 mg by mouth 3 (three) times daily. 04/17/24   [provider]  hydrOXYzine  (ATARAX ) 10 MG tablet Take 10 mg by mouth every 8 (eight) hours as needed for itching. 01/29/24   [provider]  levETIRAcetam  (KEPPRA ) 500 MG tablet TAKE (2) TABLETS BY MOUTH ONCE A DAY.TAKE AN ADDITIONAL TABLET AFTER DIALYSIS ON MONDAY,WEDNESDAY,AND FRIDAY EVENING. 05/13/24   Bacchus, [name] Z, FNP  lidocaine -prilocaine  (EMLA ) cream Apply 1 Application topically 3 (three) times a week. 06/04/22   [provider]  ondansetron  (ZOFRAN ) 4 MG tablet Take 1 tablet (4 mg total) by mouth every 6 (six) hours. Patient taking differently: Take 4 mg by mouth every 6 (six) hours as needed for nausea or vomiting. 04/27/24   Tobie Suzzane POUR, MD  oxyCODONE  (OXY IR/ROXICODONE ) 5 MG immediate release tablet Take 2.5 mg by mouth every 6 (six) hours as needed for moderate pain (pain score 4-6). 04/17/24   [provider]  pantoprazole  (PROTONIX ) 40 MG tablet Take 40 mg by mouth daily. 03/18/24   [provider]  senna-docusate (SENOKOT-S) 8.6-50 MG tablet Take 2 tablets by mouth at bedtime. 11/24/23  Pearlean Manus, MD  sevelamer  (RENAGEL ) 800 MG tablet Take 1,600 mg by mouth 3 (three) times daily with meals.    [provider]  vancomycin  (VANCOREADY) 500 MG/100ML IVPB Inject 100 mLs (500 mg total) into the vein every Monday, Wednesday, and Friday with hemodialysis. Last dose on 05/14/2024 04/21/24   Evonnie Lenis, MD    Allergies: Other, Bactoshield chg [chlorhexidine  gluconate], Cefepime , Linezolid, Moxifloxacin, Quinine derivatives, Vancomycin , Azithromycin , Benadryl [diphenhydramine hcl  (sleep)], Daptomycin, Piperacillin , Tetracycline, and Zosyn  [piperacillin  sod-tazobactam so]    Review of Systems  Constitutional:  Negative for fever.  Gastrointestinal:  Negative for abdominal pain.  Genitourinary:  Negative for difficulty urinating and hematuria.  All other systems reviewed and are negative.   Updated Vital Signs BP (!) 166/72 (BP Location: Right Arm)   Pulse 69   Temp 97.7 F (36.5 C) (Oral)   Ht 5' 1 (1.549 m)   Wt 51.7 kg   LMP  (LMP Unknown)   SpO2 95%   BMI 21.54 kg/m   Physical Exam Constitutional:      Appearance: Normal appearance.  HENT:     Head: Normocephalic and atraumatic.     Nose: Nose normal.     Mouth/Throat:     Mouth: Mucous membranes are moist.     Pharynx: Oropharynx is clear.  Cardiovascular:     Rate and Rhythm: Normal rate.  Pulmonary:     Effort: Pulmonary effort is normal.  Abdominal:     Comments: Suprapubic catheter present over the bladder area on the abdomen.  No active bleeding or drainage at this site.  The catheter bulb has protruded through the urethra in the pelvic area.  Bulb was intact.  Skin:    General: Skin is warm.  Neurological:     Mental Status: She is alert and oriented to person, place, and time.  Psychiatric:        Mood and Affect: Mood normal.        Behavior: Behavior normal.     (all labs ordered are listed, but only abnormal results are displayed) Labs Reviewed - No data to display  EKG: None  Radiology: No results found.   Medications Ordered in the ED - No data to display                                 Medical Decision Making Patient is a 59 year old female with a history of paraplegia and chronic suprapubic catheter who presents to the ED via EMS for a potentially displaced catheter.  Please see detailed HPI above.  On exam patient is alert and in no acute distress.  Physical exam as noted above.  The suprapubic catheter does appear to have displaced all the way through the  urethra with the bulb facing out.  The bulb is not leaking.  The catheter was changed yesterday so suspect most likely was placed too far.  The bulb was deflated and replaced back into the bladder.  Was then reinflated with 10 cc of fluid and appears to be in place.  The catheter did flush well into the bag with no signs of leaking.  No concerns for further emergent workup at this time.  Is stable for discharge back home.  Advised to continue following up with home health and PCP for any continued concerns.  Return precautions provided.       Final diagnoses:  Suprapubic catheter dysfunction, initial encounter  ED Discharge Orders     None          Neysa Thersia RAMAN, [place] 05/29/24 1900  "

## 2024-05-29 NOTE — ED Triage Notes (Signed)
 Pt brb EMS; reports pt seeks foley replacement. No other complaints stated at this time. A&Ox4.

## 2024-06-01 ENCOUNTER — Ambulatory Visit: Admitting: Diagnostic Neuroimaging

## 2024-06-01 ENCOUNTER — Encounter: Payer: Self-pay | Admitting: Diagnostic Neuroimaging

## 2024-06-01 VITALS — BP 114/70 | HR 67 | Ht 61.0 in | Wt 100.0 lb

## 2024-06-01 DIAGNOSIS — G44209 Tension-type headache, unspecified, not intractable: Secondary | ICD-10-CM

## 2024-06-01 DIAGNOSIS — G40909 Epilepsy, unspecified, not intractable, without status epilepticus: Secondary | ICD-10-CM | POA: Diagnosis not present

## 2024-06-01 DIAGNOSIS — G9389 Other specified disorders of brain: Secondary | ICD-10-CM | POA: Diagnosis not present

## 2024-06-01 NOTE — Progress Notes (Signed)
 "  [facility]  PATIENT: [name] DOB: Nov 09, 1965  REFERRING CLINICIAN: Freddi Hamilton, MD HISTORY FROM: patient REASON FOR VISIT: new consult   HISTORICAL  CHIEF COMPLAINT:  Chief Complaint  Patient presents with   RM 6     Patient is here with daughter for headaches - uses tylenol  as needed out of pain medication. Last saw Dr. Ines in 2016     HISTORY OF PRESENT ILLNESS:   UPDATE (06/01/24, VRP): 59 year old female here for evaluation of headaches.  05/12/2024 patient was in the hospital due to headache and shortness of breath.  She had some wheezing and was given an albuterol  treatment.  Went to the hospital for evaluation and symptoms improved with Tylenol  and DuoNeb.  CT of the head showed ventriculomegaly and she was recommend to follow-up with neurology clinic.    Since that time headaches have resolved.  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  No seizures since that time.  Has been maintained on levetiracetam  with dialysis supplement.    REVIEW OF SYSTEMS: Full 14 system review of systems performed and negative with exception of: as per HPI.  ALLERGIES: Allergies[1]  HOME MEDICATIONS: Outpatient Medications Prior to Visit  Medication Sig Dispense Refill   acetaminophen  (TYLENOL ) 500 MG tablet Take 1,000 mg by mouth every 6 (six) hours as needed for moderate pain.     albuterol  (PROVENTIL ) (2.5 MG/3ML) 0.083% nebulizer solution Take 3 mLs (2.5 mg total) by nebulization every 4 (four) hours as needed for wheezing or shortness of breath. 75 mL 2   amLODipine  (NORVASC ) 10 MG tablet Take 10 mg by mouth daily.     B Complex-C-Folic Acid  (DIALYVITE TABLET) TABS Take 1 tablet by mouth daily.     carvedilol  (COREG ) 25 MG tablet Take 25 mg by mouth 2 (two) times daily.     cefTAZidime  1 g in sodium chloride  0.9 % 100 mL Inject 1 g into the vein every Monday, Wednesday, and  Friday with hemodialysis. Last dose on dialysis 05/14/2024     cyclobenzaprine  (FLEXERIL ) 10 MG tablet Take 10 mg by mouth 3 (three) times daily as needed for muscle spasms.     Darbepoetin Alfa  (ARANESP ) 200 MCG/0.4ML SOSY injection Inject 0.4 mLs (200 mcg total) into the skin every Monday at 6 PM.     fluconazole  (DIFLUCAN ) 100 MG tablet Take 200 mg by mouth daily. After dialysis     gabapentin  (NEURONTIN ) 100 MG capsule Take 1 capsule (100 mg total) by mouth 2 (two) times daily. 60 capsule 2   hydrALAZINE  (APRESOLINE ) 50 MG tablet Take 50 mg by mouth 3 (three) times daily.     hydrOXYzine  (ATARAX ) 10 MG tablet Take 10 mg by mouth every 8 (eight) hours as needed for itching.     levETIRAcetam  (KEPPRA ) 500 MG tablet TAKE (2) TABLETS BY MOUTH ONCE A DAY.TAKE AN ADDITIONAL TABLET AFTER DIALYSIS ON MONDAY,WEDNESDAY,AND FRIDAY EVENING. 72 tablet 1   lidocaine -prilocaine  (EMLA ) cream Apply 1 Application topically 3 (three) times a week.     ondansetron  (ZOFRAN ) 4 MG tablet Take 1 tablet (4 mg total) by mouth every 6 (six) hours. (Patient taking differently: Take 4 mg by mouth every 6 (six) hours as needed for nausea or vomiting.) 12 tablet 0   pantoprazole  (PROTONIX ) 40 MG tablet Take 40 mg by mouth daily.     senna-docusate (SENOKOT-S) 8.6-50 MG tablet Take 2 tablets by mouth at bedtime. 60  tablet 3   sevelamer  (RENAGEL ) 800 MG tablet Take 1,600 mg by mouth 3 (three) times daily with meals.     vancomycin  (VANCOREADY) 500 MG/100ML IVPB Inject 100 mLs (500 mg total) into the vein every Monday, Wednesday, and Friday with hemodialysis. Last dose on 05/14/2024     oxyCODONE  (OXY IR/ROXICODONE ) 5 MG immediate release tablet Take 2.5 mg by mouth every 6 (six) hours as needed for moderate pain (pain score 4-6).     No facility-administered medications prior to visit.    PAST MEDICAL HISTORY: Past Medical History:  Diagnosis Date   Abnormal uterine bleeding (AUB) 06/15/2014   Cancer (HCC)    uterine    ESRD on hemodialysis (HCC)    High blood pressure    Paraplegia (lower)    Seizure disorder (HCC)    Seizures (HCC)    Suprapubic catheter (HCC)    Urinary tract infection     PAST SURGICAL HISTORY: Past Surgical History:  Procedure Laterality Date   APPLICATION OF WOUND VAC Right 09/13/2021   (approximately 1-32mos ago) pressure sore on right hip   BACK SURGERY     Pt stated before 2000   BIOPSY  12/03/2022   Procedure: BIOPSY;  Surgeon: [name], MD;  Location: [facility];  Service: Gastroenterology;;   ESOPHAGOGASTRODUODENOSCOPY N/A 09/20/2015   Procedure: ESOPHAGOGASTRODUODENOSCOPY (EGD);  Surgeon: [name], MD;  Location: [facility];  Service: Endoscopy;  Laterality: N/A;  730   ESOPHAGOGASTRODUODENOSCOPY (EGD) WITH PROPOFOL  N/A 12/03/2022   Procedure: ESOPHAGOGASTRODUODENOSCOPY (EGD) WITH PROPOFOL ;  Surgeon: [name], MD;  Location: [facility];  Service: Gastroenterology;  Laterality: N/A;  1:15 pm, asa 3, pt knows to arrive at 10:30  dialysis pt, M,W & F   IR CATHETER TUBE CHANGE  04/02/2018   PERCUTANEOUS ENDOSCOPIC GASTROSTOMY (PEG) REMOVAL N/A 09/20/2015   Procedure: PERCUTANEOUS ENDOSCOPIC GASTROSTOMY (PEG) REMOVAL;  Surgeon: [name], MD;  Location: [facility];  Service: Endoscopy;  Laterality: N/A;   TEE WITHOUT CARDIOVERSION N/A 11/11/2022   Procedure: TRANSESOPHAGEAL ECHOCARDIOGRAM (TEE);  Surgeon: [name], MD;  Location: [facility];  Service: Cardiovascular;  Laterality: N/A;    FAMILY HISTORY: Family History  Problem Relation Age of Onset   Cancer Mother    Hypertension Mother    Heart Problems Mother    Alcoholism Father    Sleep apnea Sister    Cancer Sister        breast and then spread everywhere.   Stroke Brother    Diabetes Paternal Grandmother    Hypertension Paternal Grandmother    Migraines Neg Hx    Seizures Neg Hx     SOCIAL HISTORY: Social History   Socioeconomic History    Marital status: Single    Spouse name: Not on file   Number of children: 3   Years of education: 9 th   Highest education level: Not on file  Occupational History    Comment: Disabled  Tobacco Use   Smoking status: Never   Smokeless tobacco: Never  Vaping Use   Vaping status: Never Used  Substance and Sexual Activity   Alcohol use: Yes    Comment: Occassionally   Drug use: No   Sexual activity: Never  Other Topics Concern   Not on file  Social History Narrative   Patient lives with her son Selena). Patient is disabled.   Education 9th grade.   Right handed.   Caffeine - mountain dew  1-2 bottles  daily    Social Drivers of Health   Tobacco Use: Low Risk (06/03/2024)   Received from [facility]   Patient History    Smoking Tobacco Use: Never    Smokeless Tobacco Use: Never    Passive Exposure: Never  Financial Resource Strain: Not on file  Food Insecurity: No Food Insecurity (04/26/2024)   Epic    Worried About Programme Researcher, Broadcasting/film/video in the Last Year: Never true    Ran Out of Food in the Last Year: Never true  Transportation Needs: No Transportation Needs (04/26/2024)   Epic    Lack of Transportation (Medical): No    Lack of Transportation (Non-Medical): No  Physical Activity: Not on file  Stress: Not on file  Social Connections: Not on file  Intimate Partner Violence: Not At Risk (04/26/2024)   Epic    Fear of Current or Ex-Partner: No    Emotionally Abused: No    Physically Abused: No    Sexually Abused: No  Depression (PHQ2-9): Medium Risk (05/18/2024)   Depression (PHQ2-9)    PHQ-2 Score: 8  Alcohol Screen: Not on file  Housing: Unknown (04/26/2024)   Epic    Unable to Pay for Housing in the Last Year: No    Number of Times Moved in the Last Year: Not on file    Homeless in the Last Year: No  Utilities: Not At Risk (04/26/2024)   Epic    Threatened with loss of utilities: No  Health Literacy: Not on file     PHYSICAL EXAM  GENERAL  EXAM/CONSTITUTIONAL: Vitals:  Vitals:   06/01/24 1415  BP: 114/70  Pulse: 67  SpO2: 96%  Weight: 100 lb (45.4 kg)  Height: 5' 1 (1.549 m)   Body mass index is 18.89 kg/m. Wt Readings from Last 3 Encounters:  06/01/24 100 lb (45.4 kg)  05/29/24 113 lb 15.7 oz (51.7 kg)  05/12/24 113 lb 15.7 oz (51.7 kg)   Patient is in no distress; well developed, nourished and groomed; neck is supple  CARDIOVASCULAR: Examination of carotid arteries is normal; no carotid bruits Regular rate and rhythm, no murmurs Examination of peripheral vascular system by observation and palpation is normal LEFT ARM FISTULA (PALPABLE THRILL)  EYES: Ophthalmoscopic exam of optic discs and posterior segments is normal; no papilledema or hemorrhages No results found.  MUSCULOSKELETAL: Gait, strength, tone, movements noted in Neurologic exam below  NEUROLOGIC: MENTAL STATUS:      No data to display         awake, alert, oriented to person, place and time recent and remote memory intact normal attention and concentration language fluent, comprehension intact, naming intact fund of knowledge appropriate  CRANIAL NERVE:  2nd - no papilledema on fundoscopic exam 2nd, 3rd, 4th, 6th - pupils equal and reactive to light, visual fields full to confrontation, extraocular muscles intact, no nystagmus 5th - facial sensation symmetric 7th - facial strength --> DECR LEFT UPPER AND LOWER FACIAL STRENGTH; SYNKINESIS ON LEFT WITH EY CLOSURE; INTERMITTENT LEFT LOWER FACIAL SPASM 8th - hearing intact 9th - palate elevates symmetrically, uvula midline 11th - shoulder shrug symmetric 12th - tongue protrusion midline  MOTOR:  normal bulk and tone, BUE 4/5 BLE FLACCID PARAPLEGIA  SENSORY:  normal and symmetric to light touch, temperature, vibration; ABSENT SENSATION IN LOWER EXT  COORDINATION:  finger-nose-finger, fine finger movements normal  REFLEXES:  deep tendon reflexes --> BUE 2; ABSENT IN  BLE  GAIT/STATION:  IN TRANSPORT POWER CHAIR  DIAGNOSTIC DATA (LABS, IMAGING, TESTING) - I reviewed patient records, labs, notes, testing and imaging myself where available.  Lab Results  Component Value Date   WBC 5.5 05/12/2024   HGB 9.1 (L) 05/12/2024   HCT 30.9 (L) 05/12/2024   MCV 97.8 05/12/2024   PLT 245 05/12/2024      Component Value Date/Time   NA 137 05/12/2024 1636   NA 138 12/04/2023 1146   K 3.5 05/12/2024 1636   CL 97 (L) 05/12/2024 1636   CO2 27 05/12/2024 1636   GLUCOSE 89 05/12/2024 1636   BUN 12 05/12/2024 1636   BUN 42 (H) 12/04/2023 1146   CREATININE 1.95 (H) 05/12/2024 1636   CREATININE 0.76 04/18/2015 1424   CALCIUM  8.9 05/12/2024 1636   PROT 7.7 05/12/2024 1636   PROT 6.4 12/04/2023 1146   ALBUMIN  3.7 05/12/2024 1636   ALBUMIN  3.1 (L) 12/04/2023 1146   AST 16 05/12/2024 1636   ALT 6 05/12/2024 1636   ALKPHOS 77 05/12/2024 1636   BILITOT 0.3 05/12/2024 1636   BILITOT 0.4 12/04/2023 1146   GFRNONAA 30 (L) 05/12/2024 1636   GFRAA 11 (L) 11/20/2017 1015   Lab Results  Component Value Date   CHOL 114 12/04/2023   HDL 28 (L) 12/04/2023   LDLCALC 71 12/04/2023   TRIG 69 12/04/2023   CHOLHDL 4.1 12/04/2023   Lab Results  Component Value Date   HGBA1C 4.9 12/04/2023   Lab Results  Component Value Date   VITAMINB12 331 12/29/2023   Lab Results  Component Value Date   TSH 7.660 (H) 12/04/2023   04/19/24 MRI brain [I reviewed images myself and agree with interpretation. -VRP]  1. No acute intracranial abnormality identified on this motion degraded examination. 2. Moderate chronic small vessel ischemic disease and cerebral atrophy. 3. Chronic left frontal infarct. 4. Small late subacute to chronic right parietal infarct.  05/12/24 CT head [I reviewed images myself. Moderate ventriculomegaly on ex vacuo basis. Minimal change since 2016. -VRP]  - Similar to multiple prior examinations, the degree of ventricular dilation appears  disproportionate to the sulci, there is relative crowding of the vertex, the callosal angle is acute (approximately 62 degrees) and the corpus callosum itself appears bowed upward. Clinical correlation for signs and symptoms of normal pressure hydrocephalus may be helpful for further management.  - IMPRESSION: 1. No acute intracranial abnormality. 2. Findings suggestive of normal pressure hydrocephalus; recommend assessment for signs and symptoms for further management. 3. 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 4. Mild cerebral atrophy and patchy hypodensities in the bilateral cerebral white matter, compatible with mild chronic small vessel ischemic disease.   ASSESSMENT AND PLAN  59 y.o. year old female here with ESR on HD (since 2019), traumatic paraplegia (~1999) here with:  Dx:  1. Tension-type headache, not intractable, unspecified chronicity pattern   2. Seizure disorder (HCC)   3. Cerebral ventriculomegaly     PLAN:  HEADACHES (05/12/24 ER evaluation; improved with tylenol , nebs, oxygen at home) - resolved; monitor  VENTRICULOMEGALY (stable finding on CT and MRI; mild progression since ~ 2016) - likely due to central atrophy on ex vacuo basis; could be related to prior TBI from 1999; do not suspect NPH clinically, and also patient not candidate for shunting; no further testing advised  SEIZURE DISORDER (likely post-traumatic; also had PRES in 2015; last seizure ~2015) - stable on levetiracetam  1000mg  daily; plus additional 500mg  on M W F (after dialysis); refills per PCP  Return for return to PCP, pending if symptoms worsen or fail to improve.    EDUARD FABIENE HANLON, MD 06/07/2024, 6:38 PM Certified in Neurology, Neurophysiology and Neuroimaging  [facility] [address] [place], [place] [address] [phone]     [1]  Allergies Allergen Reactions   Other Anaphylaxis     Dialyzer   Bactoshield Chg [Chlorhexidine  Gluconate] Hives and Itching    States she is allergic to the CHG wipes    Cefepime  Other (See Comments)    Severe AMS 09/2023 admission   Linezolid Other (See Comments)    Patient self-discontinued treatment due to GI intolerance. Taking it along with moxifloxacin   Moxifloxacin Other (See Comments)    Patient self-discontinued treatment due to GI intolerance. Taking it along with linezolid   Quinine Derivatives Other (See Comments)    Alters mental status   Vancomycin  Other (See Comments)    Unknown  Pt is tolerating this medication at HD   Azithromycin  Itching and Rash   Benadryl [Diphenhydramine Hcl (Sleep)] Hives   Daptomycin Hives   Piperacillin  Dermatitis   Tetracycline Itching    Able to tolerate Doxycycline .    Zosyn  [Piperacillin  Sod-Tazobactam So] Rash    Tolerated in 12/2023 admission   "

## 2024-06-03 ENCOUNTER — Other Ambulatory Visit: Payer: Self-pay | Admitting: Family Medicine

## 2024-06-03 DIAGNOSIS — G8929 Other chronic pain: Secondary | ICD-10-CM

## 2024-06-03 MED ORDER — OXYCODONE HCL 5 MG PO TABS: 2.5000 mg | ORAL_TABLET | Freq: Four times a day (QID) | ORAL | 0 refills | Status: AC | PRN

## 2024-06-03 NOTE — Telephone Encounter (Signed)
 Pt called back asking about refill for  oxyCODONE  (OXY IR/ROXICODONE ) 5 MG immediate release table    Please advise

## 2024-06-04 ENCOUNTER — Telehealth: Payer: Self-pay

## 2024-06-04 NOTE — Telephone Encounter (Signed)
 Daughter advised.

## 2024-06-04 NOTE — Telephone Encounter (Signed)
 Please advice if okay to change PCP.?

## 2024-06-07 ENCOUNTER — Encounter: Payer: Self-pay | Admitting: Diagnostic Neuroimaging

## 2024-06-08 ENCOUNTER — Other Ambulatory Visit: Payer: Self-pay | Admitting: Internal Medicine

## 2024-06-08 DIAGNOSIS — S71002D Unspecified open wound, left hip, subsequent encounter: Secondary | ICD-10-CM

## 2024-07-06 ENCOUNTER — Ambulatory Visit: Payer: Self-pay

## 2024-07-15 ENCOUNTER — Ambulatory Visit: Admitting: Family Medicine

## 2024-08-03 ENCOUNTER — Ambulatory Visit: Admitting: Pulmonary Disease

## 2024-10-12 ENCOUNTER — Ambulatory Visit: Payer: Self-pay | Admitting: Internal Medicine

## 2024-11-16 ENCOUNTER — Ambulatory Visit: Payer: Self-pay
